# Patient Record
Sex: Female | Born: 1940 | Race: White | Hispanic: Yes | Marital: Single | Smoking: Never smoker
Health system: Southern US, Community
[De-identification: ages and names within clinical notes are randomized; demographics above are authoritative.]

## PROBLEM LIST (undated history)

## (undated) DIAGNOSIS — I10 Essential (primary) hypertension: Secondary | ICD-10-CM

## (undated) DIAGNOSIS — R109 Unspecified abdominal pain: Secondary | ICD-10-CM

## (undated) DIAGNOSIS — Z9889 Other specified postprocedural states: Secondary | ICD-10-CM

## (undated) DIAGNOSIS — T8859XA Other complications of anesthesia, initial encounter: Secondary | ICD-10-CM

## (undated) DIAGNOSIS — C50919 Malignant neoplasm of unspecified site of unspecified female breast: Secondary | ICD-10-CM

## (undated) DIAGNOSIS — F419 Anxiety disorder, unspecified: Secondary | ICD-10-CM

## (undated) DIAGNOSIS — K219 Gastro-esophageal reflux disease without esophagitis: Secondary | ICD-10-CM

## (undated) DIAGNOSIS — F329 Major depressive disorder, single episode, unspecified: Secondary | ICD-10-CM

## (undated) DIAGNOSIS — R0600 Dyspnea, unspecified: Secondary | ICD-10-CM

## (undated) DIAGNOSIS — R1031 Right lower quadrant pain: Secondary | ICD-10-CM

## (undated) DIAGNOSIS — Z8489 Family history of other specified conditions: Secondary | ICD-10-CM

## (undated) DIAGNOSIS — J45909 Unspecified asthma, uncomplicated: Secondary | ICD-10-CM

## (undated) DIAGNOSIS — M545 Low back pain: Secondary | ICD-10-CM

## (undated) DIAGNOSIS — E039 Hypothyroidism, unspecified: Secondary | ICD-10-CM

## (undated) DIAGNOSIS — M542 Cervicalgia: Secondary | ICD-10-CM

## (undated) DIAGNOSIS — R300 Dysuria: Secondary | ICD-10-CM

## (undated) DIAGNOSIS — D649 Anemia, unspecified: Secondary | ICD-10-CM

## (undated) DIAGNOSIS — K529 Noninfective gastroenteritis and colitis, unspecified: Secondary | ICD-10-CM

## (undated) DIAGNOSIS — I209 Angina pectoris, unspecified: Secondary | ICD-10-CM

## (undated) DIAGNOSIS — M810 Age-related osteoporosis without current pathological fracture: Secondary | ICD-10-CM

## (undated) DIAGNOSIS — Z8619 Personal history of other infectious and parasitic diseases: Secondary | ICD-10-CM

## (undated) DIAGNOSIS — H547 Unspecified visual loss: Secondary | ICD-10-CM

## (undated) DIAGNOSIS — N390 Urinary tract infection, site not specified: Secondary | ICD-10-CM

## (undated) DIAGNOSIS — M79605 Pain in left leg: Secondary | ICD-10-CM

## (undated) DIAGNOSIS — K589 Irritable bowel syndrome without diarrhea: Secondary | ICD-10-CM

## (undated) DIAGNOSIS — K9041 Non-celiac gluten sensitivity: Secondary | ICD-10-CM

## (undated) DIAGNOSIS — M1990 Unspecified osteoarthritis, unspecified site: Secondary | ICD-10-CM

## (undated) DIAGNOSIS — G4730 Sleep apnea, unspecified: Secondary | ICD-10-CM

## (undated) DIAGNOSIS — J45991 Cough variant asthma: Secondary | ICD-10-CM

## (undated) DIAGNOSIS — Z923 Personal history of irradiation: Secondary | ICD-10-CM

## (undated) DIAGNOSIS — D229 Melanocytic nevi, unspecified: Secondary | ICD-10-CM

## (undated) DIAGNOSIS — T4145XA Adverse effect of unspecified anesthetic, initial encounter: Secondary | ICD-10-CM

## (undated) DIAGNOSIS — Z5189 Encounter for other specified aftercare: Secondary | ICD-10-CM

## (undated) DIAGNOSIS — E785 Hyperlipidemia, unspecified: Secondary | ICD-10-CM

## (undated) DIAGNOSIS — R112 Nausea with vomiting, unspecified: Secondary | ICD-10-CM

## (undated) DIAGNOSIS — J189 Pneumonia, unspecified organism: Secondary | ICD-10-CM

## (undated) DIAGNOSIS — I509 Heart failure, unspecified: Secondary | ICD-10-CM

## (undated) HISTORY — DX: Personal history of other infectious and parasitic diseases: Z86.19

## (undated) HISTORY — DX: Sleep apnea, unspecified: G47.30

## (undated) HISTORY — DX: Dyspnea, unspecified: R06.00

## (undated) HISTORY — DX: Hypothyroidism, unspecified: E03.9

## (undated) HISTORY — DX: Encounter for other specified aftercare: Z51.89

## (undated) HISTORY — DX: Melanocytic nevi, unspecified: D22.9

## (undated) HISTORY — DX: Malignant neoplasm of unspecified site of unspecified female breast: C50.919

## (undated) HISTORY — PX: FRACTURE SURGERY: SHX138

## (undated) HISTORY — DX: Pain in left leg: M79.605

## (undated) HISTORY — DX: Unspecified abdominal pain: R10.9

## (undated) HISTORY — DX: Low back pain: M54.5

## (undated) HISTORY — DX: Irritable bowel syndrome, unspecified: K58.9

## (undated) HISTORY — DX: Unspecified asthma, uncomplicated: J45.909

## (undated) HISTORY — PX: EYE SURGERY: SHX253

## (undated) HISTORY — PX: TONSILLECTOMY: SUR1361

## (undated) HISTORY — DX: Noninfective gastroenteritis and colitis, unspecified: K52.9

## (undated) HISTORY — DX: Hyperlipidemia, unspecified: E78.5

## (undated) HISTORY — PX: ABDOMINAL HYSTERECTOMY: SHX81

## (undated) HISTORY — DX: Urinary tract infection, site not specified: N39.0

## (undated) HISTORY — DX: Essential (primary) hypertension: I10

## (undated) HISTORY — DX: Unspecified visual loss: H54.7

## (undated) HISTORY — PX: APPENDECTOMY: SHX54

## (undated) HISTORY — DX: Dysuria: R30.0

## (undated) HISTORY — DX: Non-celiac gluten sensitivity: K90.41

## (undated) HISTORY — DX: Cervicalgia: M54.2

## (undated) HISTORY — DX: Gastro-esophageal reflux disease without esophagitis: K21.9

## (undated) HISTORY — DX: Age-related osteoporosis without current pathological fracture: M81.0

## (undated) HISTORY — DX: Right lower quadrant pain: R10.31

## (undated) HISTORY — DX: Cough variant asthma: J45.991

## (undated) HISTORY — PX: UPPER GASTROINTESTINAL ENDOSCOPY: SHX188

## (undated) HISTORY — PX: CHOLECYSTECTOMY: SHX55

## (undated) HISTORY — DX: Heart failure, unspecified: I50.9

## (undated) NOTE — *Deleted (*Deleted)
Cardiology Admission History and Physical:   Patient ID: Julie Wilkerson MRN: [id number]; DOB: Jun 01, 1940   Admission date: 03/04/2020  Primary Care Provider: Bradd Canary, MD [facility] Cardiologist: Thomasene Ripple, DO  [facility] Electrophysiologist:  None   Chief Complaint:  Chest pain  Patient Profile:   Julie Wilkerson is a 31 y.o. female with PMH of nonobstructive CAD, hypertension, hyperlipidemia, chronic diastolic heart failure who presented to med [facility] with chest pain.  History of Present Illness:   Julie Wilkerson is a 29 year old female with past medical history noted above.  Catheterization on 01/2019 which noted mild to moderate nonobstructive CAD including 30% proximal to mid LAD, 50% ostial diagonal and 20% ostial/proximal left main and mid RCA lesions.  This was done in the setting of preoperative evaluation for hip surgery. She was continued on medical therapy.  She underwent hip surgery on 02/13/2019 which was complicated by acute on chronic diastolic heart failure as she was not placed on Lasix post discharge.  She was seen in the office on November 2020 and reported intermittent palpitations.  Had a ZIO monitor placed to rule out arrhythmias.  There showed 9 episodes of SVT which was felt to be atrial tachycardia with variable block.  She was continued on Toprol 25 mg daily.  She was last seen in the office on 02/26/2020 when she reported she was experiencing more palpitations.  Plan was to have her take an extra 2.5 mg of her metoprolol XL as needed.  She was to follow-up within 2 weeks to determine if this helps with symptoms.  Also complained of some left-sided arm pain which was felt to be neuropathic.  She was continued on aspirin 81 mg daily along with atorvastatin 20 mg daily.  Presented to med [facility] ED on 10/12 with complaints of headache and chest pain.  Awoke around 2 AM with severe headache associated with chest pain and shortness of  breath.  Also complained of pain in her left arm and hand.  Also indicated she had had episodes of chest pain over the past few days.***  In the ED her labs showed stable electrolytes, creatinine 1.07, high-sensitivity troponin 36>> 37, hemoglobin 10.6, D-dimer 1.66.  Chest x-ray with no acute abnormality.  CTA was negative for dissection or PE.  Case was discussed with Dr. Duke Salvia and patient was transferred to [facility] for further management.  Past Medical History:  Diagnosis Date  . Anemia   . Anginal pain (HCC)   . Anxiety   . Arthritis   . Asthma   . Blood transfusion without reported diagnosis   . Breast cancer (HCC)    2002, left, encapsulated, microcalcifications. lumpectomy, radtiation x 30  . Breast cancer in female [facility])   . Change in mole 06/08/2016  . CHF (congestive heart failure) (HCC)   . Colitis   . Complication of anesthesia    VERY SENSITIVE TO ANESTHESIA   . Decreased visual acuity 01/16/2017  . Dyspnea 11/04/2016   with asthma exacerbation only  . Dysuria 11/04/2016  . Gluten intolerance   . History of chicken pox   . History of Helicobacter pylori infection 08/01/2015  . Hyperlipidemia   . Hypertension   . Hypothyroidism   . IBS (irritable bowel syndrome)   . Left leg pain 03/01/2017  . Low back pain 08/10/2015  . 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 Neck pain 03/01/2017  . Osteoporosis   . Personal history of  radiation therapy   . Pneumonia   . PONV (postoperative nausea and vomiting)   . RLQ discomfort 03/01/2017  . Stomach cramps   . Urinary tract infection 11/04/2016    Past Surgical History:  Procedure Laterality Date  . ABDOMINAL HYSTERECTOMY     partial  . APPENDECTOMY    . BREAST LUMPECTOMY Left 2002  . CHOLECYSTECTOMY  Age 9 or 64  . COLONOSCOPY  2017  . EYE SURGERY Left    cataract  . LEFT HEART CATH AND CORONARY ANGIOGRAPHY N/A 02/09/2019   Procedure: LEFT HEART CATH AND CORONARY ANGIOGRAPHY;  Surgeon: Yvonne Kendall, MD;  Location: [facility] INVASIVE CV LAB;  Service: Cardiovascular;  Laterality: N/A;  . TONSILLECTOMY    . TOTAL HIP ARTHROPLASTY Left 02/13/2019   Procedure: TOTAL HIP ARTHROPLASTY ANTERIOR APPROACH;  Surgeon: Durene Romans, MD;  Location: [facility] ORS;  Service: Orthopedics;  Laterality: Left;  70 mins     Medications Prior to Admission: Prior to Admission medications   Medication Sig Start Date End Date Taking? Authorizing Provider  albuterol (VENTOLIN HFA) 108 (90 Base) MCG/ACT inhaler Inhale 2 puffs into the lungs every 6 (six) hours as needed for wheezing or shortness of breath. 02/26/19   Bradd Canary, MD  ASPIRIN 81 PO 81 mg 2 (two) times daily.    [provider]  atorvastatin (LIPITOR) 20 MG tablet Take 1 tablet by mouth once daily 02/14/20   Tobb, Kardie, DO  citalopram (CELEXA) 20 MG tablet TAKE 1 TABLET BY MOUTH AT BEDTIME 10/08/19   Bradd Canary, MD  dicyclomine (BENTYL) 10 MG capsule TAKE 1 CAPSULE BY MOUTH EVERY 8 HOURS AS NEEDED FOR SPASMS 11/21/19   Armbruster, Willaim Rayas, MD  fluticasone (FLONASE) 50 MCG/ACT nasal spray Place 2 sprays into both nostrils daily as needed for allergies or rhinitis. 09/18/19   Bradd Canary, MD  furosemide (LASIX) 40 MG tablet Take 1 tablet by mouth once daily 12/19/19   Bradd Canary, MD  hyoscyamine (LEVSIN SL) 0.125 MG SL tablet DISSOLVE 1 TABLET UNDER THE TONGUE EVERY 6 HOURS AS NEEDED 01/24/20   Bradd Canary, MD  levothyroxine (EUTHYROX) 50 MCG tablet Take 1 tablet (50 mcg total) by mouth daily. 02/28/20   Bradd Canary, MD  lisinopril (ZESTRIL) 5 MG tablet Take 1 tablet by mouth twice daily 10/31/19   Bradd Canary, MD  metoprolol succinate (TOPROL-XL) 25 MG 24 hr tablet TAKE 1 TABLET BY MOUTH AT BEDTIME 02/13/20   Bradd Canary, MD  omega-3 acid ethyl esters (LOVAZA) 1 g capsule Take 2 g by mouth daily.    [provider]  omeprazole (PRILOSEC) 40 MG capsule Take 1 capsule (40 mg total) by mouth in the morning and at  bedtime. Take 1 tablet by mouth once to twice daily 11/20/19   Armbruster, Willaim Rayas, MD  ondansetron (ZOFRAN) 4 MG tablet Take 1 tablet (4 mg total) by mouth every 8 (eight) hours as needed for nausea or vomiting. 02/26/19   Bradd Canary, MD  potassium chloride (KLOR-CON) 10 MEQ tablet Take 1 tablet by mouth once daily 10/23/19   Bradd Canary, MD  pramipexole (MIRAPEX) 0.125 MG tablet Take 1 tablet (0.125 mg total) by mouth 2 (two) times daily. 05/03/19   Bradd Canary, MD  primidone (MYSOLINE) 50 MG tablet TAKE 1 TABLET BY MOUTH AT BEDTIME 08/20/19   Tat, Octaviano Batty, DO  sucralfate (CARAFATE) 1 g tablet Take 1 tablet (1 g total)  by mouth 2 (two) times daily. 11/16/19   Saguier, Ramon Dredge, PA-C     Allergies:   No Known Allergies  Social History:   Social History   Socioeconomic History  . Marital status: Single    Spouse name: Not on file  . Number of children: 3  . Years of education: 75  . Highest education level: Master's degree (e.g., MA, MS, MEng, MEd, MSW, MBA)  Occupational History  . Occupation: retired    Comment: Runner, broadcasting/film/video, kindergarten  Tobacco Use  . Smoking status: Never Smoker  . Smokeless tobacco: Never Used  Vaping Use  . Vaping Use: Never used  Substance and Sexual Activity  . Alcohol use: Never    Alcohol/week: 0.0 standard drinks  . Drug use: Never  . Sexual activity: Not Currently    Birth control/protection: None    Comment: lives alone, avoids daiiry and gluten. 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 Not on file  Social History Narrative  . Not on file   Social Determinants of Health   Financial Resource Strain:   . Difficulty of Paying Living Expenses: Not on file  Food Insecurity:   . Worried About Programme researcher, broadcasting/film/video in the Last Year: Not on file  . Ran Out of Food in the Last Year: Not on file  Transportation Needs:   . Lack of Transportation (Medical): Not on file  . Lack of Transportation (Non-Medical): Not on file  Physical  Activity:   . Days of Exercise per Week: Not on file  . Minutes of Exercise per Session: Not on file  Stress:   . Feeling of Stress : Not on file  Social Connections:   . Frequency of Communication with Friends and Family: Not on file  . Frequency of Social Gatherings with Friends and Family: Not on file  . Attends Religious Services: Not on file  . Active Member of Clubs or Organizations: Not on file  . Attends Banker Meetings: Not on file  . Marital Status: Not on file  Intimate Partner Violence:   . Fear of Current or Ex-Partner: Not on file  . Emotionally Abused: Not on file  . Physically Abused: Not on file  . Sexually Abused: Not on file    Family History:   The patient's family history includes Arthritis in her daughter; Colitis in her mother; Colon cancer in her paternal grandmother; Heart attack in her brother, brother, and brother; Heart disease in her daughter; Hyperlipidemia in her brother, brother, brother, and sister; Hypertension in her father; Irritable bowel syndrome in her mother; Leukemia in her daughter; Stomach cancer (age of onset: 60) in her paternal grandmother. There is no history of Esophageal cancer.    ROS:  Please see the history of present illness.  All other ROS reviewed and negative.     Physical Exam/Data:   Vitals:   03/04/20 1012 03/04/20 1013 03/04/20 1230  BP: (!) 145/85  139/64  Pulse: 60  70  Resp: 18  20  Temp: 98.7 F (37.1 C)    TempSrc: Oral    SpO2: 98%  97%  Weight:  54.4 kg   Height:  4\' 10"  (1.473 m)    No intake or output data in the 24 hours ending 03/04/20 1444 Last 3 Weights 03/04/2020 02/26/2020 01/31/2020  Weight (lbs) 120 lb 122 lb 12.8 oz (No Data)  Weight (kg) 54.432 kg 55.702 kg (No Data)     Body mass index is 25.08 kg/m.  General:  Well nourished, well developed, in no acute distress*** HEENT: normal Lymph: no adenopathy Neck: no*** JVD Endocrine:  No thryomegaly Vascular: No carotid bruits; FA  pulses 2+ bilaterally without bruits  Cardiac:  normal S1, S2; RRR; no murmur *** Lungs:  clear to auscultation bilaterally, no wheezing, rhonchi or rales  Abd: soft, nontender, no hepatomegaly  Ext: no*** edema Musculoskeletal:  No deformities, BUE and BLE strength normal and equal Skin: warm and dry  Neuro:  CNs 2-12 intact, no focal abnormalities noted Psych:  Normal affect    EKG:  The ECG that was done *** was personally reviewed and demonstrates ***  Relevant CV Studies: Cardiac monitor 04/24/2019: The patient wore the monitor for 5 day starting 04/03/2019. Indication: Palpitations The minimum heart rate was 45 bpm, the maximum heart rate was 144 bpm, and average heart rate was 59 bpm. Predominant underlying rhythm was Sinus Rhythm.  9 Supraventricular Tachycardia runs occurred, the run with the fastest interval lasting 5 beats with a max rate of 144 bpm, the longest lasting 13 beats with an avg rate of 109 bpm. Premature atrial complexes were rare (<1.0%). Premature ventricular complexes were rare (<1.0%). No patient triggered events were noted.  No Ventricular tachycardia, No pauses, No AV block and no atrial fibrillation present.  Conclusion: This study is remarkable for supraventricular tachycardia which is likely atrial tachycardia with variable block.   Limited echo 02/16/19: 1. Left ventricular ejection fraction, by visual estimation, is 60 to  65%. The left ventricle has normal function. There is no left ventricular  hypertrophy.  2. Global right ventricle has normal systolic function.The right  ventricular size is normal. No increase in right ventricular wall  thickness.  3. .  4. Limited echo.   Left heart catheterization 01/2019: Conclusions:  Mild to moderate, non-obstructive coronary artery disease, including sequential 30% proximal and mid LAD stenoses, 50% ostial D1 lesion, and 20% ostial/proximal LMCA and mid RCA lesions.  No definite evidence of  myocardial bridge on today's study.  Normal left ventricular filling pressure.  LVEF noted to be normal on recent echocardiogram.  Recommendations:  Continue medical therapy and risk factor modification.  Consider addition of statin to prevent progression of disease.  Hold aspirin in the setting of upcoming orthopedic surgery next week.  Long-term, I recommend aspirin 81 mg daily in the setting of mild to moderate coronary artery disease.  I think it is reasonable to proceed with hip arthroplasty next week without additional cardiac testing/intervention.  Echocardiogram 02/06/2019: 1. The left ventricle has normal systolic function with an ejection  fraction of 60-65%. The cavity size was normal. Left ventricular diastolic  Doppler parameters are consistent with pseudonormalization. Elevated mean  left atrial pressure No evidence of  left ventricular regional wall motion abnormalities.  2. The right ventricle has normal systolic function. The cavity was  normal. There is no increase in right ventricular wall thickness. Right  ventricular systolic pressure is normal.  3. Left atrial size was mildly dilated.  4. No evidence of mitral valve stenosis.  5. The aortic valve is tricuspid. Mild thickening of the aortic valve.  Mild calcification of the aortic valve. Aortic valve regurgitation is mild  by color flow Doppler. No stenosis of the aortic valve.  6. The aorta is normal unless otherwise noted.  7. The average left ventricular global longitudinal strain is -19.6 %.   Laboratory Data:  High Sensitivity Troponin:   Recent Labs  Lab 03/04/20 1052 03/04/20 1238  TROPONINIHS  36* 37*      Chemistry Recent Labs  Lab 03/04/20 1052  NA 139  K 3.6  CL 103  CO2 25  GLUCOSE 99  BUN 21  CREATININE 1.07*  CALCIUM 8.7*  GFRNONAA 50*  ANIONGAP 11    Recent Labs  Lab 03/04/20 1052  PROT 7.1  ALBUMIN 4.0  AST 32  ALT 21  ALKPHOS 65  BILITOT 0.6   Hematology Recent  Labs  Lab 03/04/20 1052  WBC 3.9*  RBC 3.65*  HGB 10.6*  HCT 32.5*  MCV 89.0  MCH 29.0  MCHC 32.6  RDW 14.3  PLT 187   BNPNo results for input(s): BNP, PROBNP in the last 168 hours.  DDimer  Recent Labs  Lab 03/04/20 1052  DDIMER 1.66*     Radiology/Studies:  DG Chest 2 View  Result Date: 03/04/2020 CLINICAL DATA:  Chest pain EXAM: CHEST - 2 VIEW COMPARISON:  February 22, 2019 FINDINGS: There are areas of scattered scarring in each mid and lower lung region. Lungs elsewhere are clear. No edema or airspace opacity. Heart size and pulmonary vascularity are normal. There is aortic atherosclerosis. There is thoracolumbar levoscoliosis. IMPRESSION: Scattered areas of scarring bilaterally. No edema or airspace opacity. Heart size normal. Aortic Atherosclerosis (ICD10-I70.0). Electronically Signed   By: Bretta Bang III M.D.   On: 03/04/2020 11:36   CT Head Wo Contrast  Result Date: 03/04/2020 CLINICAL DATA:  Headache and nausea EXAM: CT HEAD WITHOUT CONTRAST TECHNIQUE: Contiguous axial images were obtained from the base of the skull through the vertex without intravenous contrast. COMPARISON:  February 22, 2019 FINDINGS: Brain: There is stable age related volume loss. There is no intracranial mass, hemorrhage, extra-axial fluid collection, or midline shift. There is patchy small vessel disease in the centra semiovale bilaterally. No acute infarct is evident. Vascular: No hyperdense vessel. There is calcification in each carotid siphon region. Skull: The bony calvarium appears intact. Sinuses/Orbits: There is patchy opacity in the sphenoid sinuses bilaterally. Visualized orbits appear symmetric bilaterally except for evidence of previous cataract removal on the left. Other: There is opacification in several inferior mastoid air cells on the left. Mastoids elsewhere are clear. IMPRESSION: Age related volume loss with stable periventricular small vessel disease. No acute infarct. No mass or  hemorrhage. There are foci of arterial vascular calcification. There is patchy opacity in the sphenoid sinuses bilaterally. There is stable inferior mastoid air cell disease on the left. Electronically Signed   By: Bretta Bang III M.D.   On: 03/04/2020 11:29   CT Angio Chest/Abd/Pel for Dissection W and/or W/WO  Result Date: 03/04/2020 CLINICAL DATA:  Chest pain radiating to back, aortic dissection suspected EXAM: CT ANGIOGRAPHY CHEST, ABDOMEN AND PELVIS TECHNIQUE: Non-contrast CT of the chest was initially obtained. Multidetector CT imaging through the chest, abdomen and pelvis was performed using the standard protocol during bolus administration of intravenous contrast. Multiplanar reconstructed images and MIPs were obtained and reviewed to evaluate the vascular anatomy. CONTRAST:  OMNIPAQUE IOHEXOL 350 MG/ML SOLN COMPARISON:  CT abdomen pelvis, 06/06/2019 FINDINGS: CTA CHEST FINDINGS Cardiovascular: Preferential opacification of the thoracic aorta. Normal contour and caliber of the thoracic aorta. No evidence of aneurysm, dissection, or other acute aortic pathology. Scattered atherosclerosis. Normal heart size. No pericardial effusion. Mediastinum/Nodes: No enlarged mediastinal, hilar, or axillary lymph nodes. Small hiatal hernia. Thyroid gland, trachea, and esophagus demonstrate no significant findings. Lungs/Pleura: Bandlike scarring of the lung bases. No pleural effusion or pneumothorax. Musculoskeletal: No chest wall abnormality. No  acute or significant osseous findings. Review of the MIP images confirms the above findings. CTA ABDOMEN AND PELVIS FINDINGS VASCULAR Normal contour and caliber of the abdominal aorta. No evidence of aneurysm, dissection, or other acute aortic pathology. Standard branching pattern of the abdominal aorta, with solitary bilateral renal arteries. Scattered atherosclerosis. Review of the MIP images confirms the above findings. NON-VASCULAR Hepatobiliary: No focal  liver abnormality is seen. Status post cholecystectomy. Postoperative biliary dilatation. Pancreas: Unremarkable. No pancreatic ductal dilatation or surrounding inflammatory changes. Spleen: Normal in size without significant abnormality. Adrenals/Urinary Tract: Adrenal glands are unremarkable. Kidneys are normal, without renal calculi, solid lesion, or hydronephrosis. Bladder is unremarkable. Stomach/Bowel: Stomach is within normal limits. Appendix appears normal. No evidence of bowel wall thickening, distention, or inflammatory changes. Sigmoid diverticulosis. Lymphatic: No enlarged abdominal or pelvic lymph nodes. Reproductive: Status post hysterectomy. Other: No abdominal wall hernia or abnormality. No abdominopelvic ascites. Musculoskeletal: No acute or significant osseous findings. Status post left hip total arthroplasty. Review of the MIP images confirms the above findings. IMPRESSION: 1. Normal contour and caliber of the thoracic and abdominal aorta. No evidence of aneurysm, dissection, or other acute aortic pathology. Scattered atherosclerosis. 2. No acute findings in the chest, abdomen or pelvis to explain pain. 3. Small hiatal hernia. 4. Sigmoid diverticulosis without evidence of diverticulitis. 5. Status post cholecystectomy and hysterectomy. Electronically Signed   By: Lauralyn Primes M.D.   On: 03/04/2020 12:52     Assessment and Plan:   1. Chest pain in patient with known mild to moderate CAD: patient presented to [facility] with complaints of chest pain that awoke her from sleep at 2am this morning with associated HA and SOB. EKG with sinus rhythm and non-specific ST-T wave abnormalities. HsTrop 36>37; low flat trend not c/w ACS. CTA Chest was without PE or dissection.   {Complete the following score calculators/questions which help meet required metrics.  Press F2         :[id number]}   {Is the patient being seen for CHEST PAIN, UNSTABLE ANGINA, NSTEMI or STEMI?     :[id number]}  {Does this patient  have CHF or CHF symptoms?      :[id number]} {Does this patient have ATRIAL FIBRILLATION?:[id number]} Severity of Illness: {Observation/Inpatient:21159}   For questions or updates, please contact [facility] Please consult [email or web address] for contact info under     Signed, Laverda Page, NP  03/04/2020 2:44 PM

---
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
## 2000-05-24 HISTORY — PX: BREAST LUMPECTOMY: SHX2

## 2011-04-06 LAB — HM COLONOSCOPY

## 2014-11-07 LAB — HM DEXA SCAN

## 2014-11-11 ENCOUNTER — Ambulatory Visit: Payer: Self-pay | Admitting: Family Medicine

## 2015-01-20 ENCOUNTER — Ambulatory Visit: Payer: Self-pay | Admitting: Family Medicine

## 2015-02-25 DIAGNOSIS — H53142 Visual discomfort, left eye: Secondary | ICD-10-CM | POA: Diagnosis not present

## 2015-02-25 DIAGNOSIS — R51 Headache: Secondary | ICD-10-CM | POA: Diagnosis not present

## 2015-02-25 DIAGNOSIS — R42 Dizziness and giddiness: Secondary | ICD-10-CM | POA: Diagnosis not present

## 2015-02-25 DIAGNOSIS — R0789 Other chest pain: Secondary | ICD-10-CM | POA: Diagnosis not present

## 2015-03-11 DIAGNOSIS — Z23 Encounter for immunization: Secondary | ICD-10-CM | POA: Diagnosis not present

## 2015-05-25 HISTORY — PX: COLONOSCOPY: SHX174

## 2015-06-11 DIAGNOSIS — R1013 Epigastric pain: Secondary | ICD-10-CM | POA: Diagnosis not present

## 2015-06-11 DIAGNOSIS — I10 Essential (primary) hypertension: Secondary | ICD-10-CM | POA: Diagnosis not present

## 2015-06-11 DIAGNOSIS — E039 Hypothyroidism, unspecified: Secondary | ICD-10-CM | POA: Diagnosis not present

## 2015-06-11 DIAGNOSIS — R69 Illness, unspecified: Secondary | ICD-10-CM | POA: Diagnosis not present

## 2015-06-11 DIAGNOSIS — M810 Age-related osteoporosis without current pathological fracture: Secondary | ICD-10-CM | POA: Diagnosis not present

## 2015-06-11 DIAGNOSIS — R251 Tremor, unspecified: Secondary | ICD-10-CM | POA: Diagnosis not present

## 2015-06-26 DIAGNOSIS — D649 Anemia, unspecified: Secondary | ICD-10-CM | POA: Diagnosis not present

## 2015-06-26 DIAGNOSIS — R197 Diarrhea, unspecified: Secondary | ICD-10-CM | POA: Diagnosis not present

## 2015-06-26 DIAGNOSIS — R1314 Dysphagia, pharyngoesophageal phase: Secondary | ICD-10-CM | POA: Diagnosis not present

## 2015-06-26 DIAGNOSIS — K219 Gastro-esophageal reflux disease without esophagitis: Secondary | ICD-10-CM | POA: Diagnosis not present

## 2015-06-26 DIAGNOSIS — R1084 Generalized abdominal pain: Secondary | ICD-10-CM | POA: Diagnosis not present

## 2015-06-30 DIAGNOSIS — R1084 Generalized abdominal pain: Secondary | ICD-10-CM | POA: Diagnosis not present

## 2015-06-30 DIAGNOSIS — R197 Diarrhea, unspecified: Secondary | ICD-10-CM | POA: Diagnosis not present

## 2015-07-11 DIAGNOSIS — R1084 Generalized abdominal pain: Secondary | ICD-10-CM | POA: Diagnosis not present

## 2015-07-11 DIAGNOSIS — D649 Anemia, unspecified: Secondary | ICD-10-CM | POA: Diagnosis not present

## 2015-07-14 DIAGNOSIS — R251 Tremor, unspecified: Secondary | ICD-10-CM | POA: Diagnosis not present

## 2015-07-14 DIAGNOSIS — R200 Anesthesia of skin: Secondary | ICD-10-CM | POA: Diagnosis not present

## 2015-07-14 DIAGNOSIS — I10 Essential (primary) hypertension: Secondary | ICD-10-CM | POA: Diagnosis not present

## 2015-07-14 DIAGNOSIS — R1013 Epigastric pain: Secondary | ICD-10-CM | POA: Diagnosis not present

## 2015-07-24 DIAGNOSIS — R200 Anesthesia of skin: Secondary | ICD-10-CM | POA: Diagnosis not present

## 2015-07-24 DIAGNOSIS — Z8673 Personal history of transient ischemic attack (TIA), and cerebral infarction without residual deficits: Secondary | ICD-10-CM | POA: Diagnosis not present

## 2015-07-24 DIAGNOSIS — R251 Tremor, unspecified: Secondary | ICD-10-CM | POA: Diagnosis not present

## 2015-07-24 DIAGNOSIS — R4182 Altered mental status, unspecified: Secondary | ICD-10-CM | POA: Diagnosis not present

## 2015-07-31 ENCOUNTER — Encounter: Payer: Self-pay | Admitting: *Deleted

## 2015-07-31 ENCOUNTER — Telehealth: Payer: Self-pay | Admitting: *Deleted

## 2015-07-31 NOTE — Telephone Encounter (Signed)
Pre-Visit Call completed with patient and chart updated.   Pre-Visit Info documented in Specialty Comments under [employer].    

## 2015-07-31 NOTE — Telephone Encounter (Signed)
Pt not available at time of call. She is waiting to get her MMG and will try to call back this afternoon.

## 2015-08-01 ENCOUNTER — Encounter: Payer: Self-pay | Admitting: Family Medicine

## 2015-08-01 ENCOUNTER — Ambulatory Visit (INDEPENDENT_AMBULATORY_CARE_PROVIDER_SITE_OTHER): Payer: Medicare HMO | Admitting: Family Medicine

## 2015-08-01 VITALS — BP 122/78 | HR 64 | Temp 97.5°F | Ht 59.0 in | Wt 124.1 lb

## 2015-08-01 DIAGNOSIS — Z8619 Personal history of other infectious and parasitic diseases: Secondary | ICD-10-CM | POA: Diagnosis not present

## 2015-08-01 DIAGNOSIS — C50919 Malignant neoplasm of unspecified site of unspecified female breast: Secondary | ICD-10-CM | POA: Insufficient documentation

## 2015-08-01 DIAGNOSIS — N63 Unspecified lump in unspecified breast: Secondary | ICD-10-CM

## 2015-08-01 DIAGNOSIS — K589 Irritable bowel syndrome without diarrhea: Secondary | ICD-10-CM | POA: Insufficient documentation

## 2015-08-01 DIAGNOSIS — E039 Hypothyroidism, unspecified: Secondary | ICD-10-CM | POA: Diagnosis not present

## 2015-08-01 DIAGNOSIS — M810 Age-related osteoporosis without current pathological fracture: Secondary | ICD-10-CM

## 2015-08-01 DIAGNOSIS — J45991 Cough variant asthma: Secondary | ICD-10-CM | POA: Insufficient documentation

## 2015-08-01 DIAGNOSIS — K9041 Non-celiac gluten sensitivity: Secondary | ICD-10-CM | POA: Insufficient documentation

## 2015-08-01 DIAGNOSIS — M545 Low back pain: Secondary | ICD-10-CM

## 2015-08-01 DIAGNOSIS — E785 Hyperlipidemia, unspecified: Secondary | ICD-10-CM

## 2015-08-01 DIAGNOSIS — I10 Essential (primary) hypertension: Secondary | ICD-10-CM

## 2015-08-01 HISTORY — DX: Personal history of other infectious and parasitic diseases: Z86.19

## 2015-08-01 MED ORDER — HYOSCYAMINE SULFATE 0.125 MG SL SUBL: 0.1250 mg | SUBLINGUAL_TABLET | SUBLINGUAL | Status: DC | PRN

## 2015-08-01 NOTE — Assessment & Plan Note (Signed)
Well controlled, no changes to meds. Encouraged heart healthy diet such as the DASH diet and exercise as tolerated.  °

## 2015-08-01 NOTE — Progress Notes (Signed)
Subjective:    Patient ID: Julie Wilkerson, female    DOB: [birth year], 75 y.o.   MRN: [id number]  Chief Complaint  Patient presents with  . Establish Care    HPI Patient is in today to establish care as new patient. Patient has some concerns with LLQ adominal pain as well as low back pain, that radiates down leg left leg.   She denies any fefvers, chills or anorexia. Does endorse some nausea but no vomiting. her Denies CP/palp/SOB/HA/congestion/fevers or GU c/o. Taking meds as prescribed Past Medical History  Diagnosis Date  . Stomach cramps   . IBS (irritable bowel syndrome)   . Gluten intolerance   . Asthma   . Breast cancer ([place])   . Hyperlipidemia   . Hypertension   . Osteoporosis   . Hypothyroid   . History of Helicobacter pylori infection 08/01/2015    Past Surgical History  Procedure Laterality Date  . Appendectomy    . Abdominal hysterectomy    . Cholecystectomy  Age 64 or 39  . Breast lumpectomy Left 2002  . Tonsillectomy    . Fracture surgery      Family History  Problem Relation Age of Onset  . Colitis Mother   . Irritable bowel syndrome Mother   . Hypertension Father   . Hyperlipidemia Brother   . Heart attack Brother   . Stomach cancer Paternal Grandmother   . Hyperlipidemia Brother   . Heart attack Brother   . Hyperlipidemia Brother   . Heart attack Brother     Social History   Social History  . Marital Status: Divorced    Spouse Name: N/A  . Number of Children: N/A  . Years of Education: N/A   Occupational History  . retired    Social History Main Topics  . Smoking status: Never Smoker   . Smokeless tobacco: Not on file  . Alcohol Use: No  . Drug Use: No  . Sexual Activity: Not on file   Other Topics Concern  . Not on file   Social History Narrative    Outpatient Prescriptions Prior to Visit  Medication Sig Dispense Refill  . alendronate (FOSAMAX) 70 MG tablet Take 70 mg by mouth once a week.    . budesonide-formoterol  (SYMBICORT) 80-4.5 MCG/ACT inhaler Inhale 2 puffs into the lungs 2 (two) times daily.    . citalopram (CELEXA) 10 MG tablet Take 10 mg by mouth daily.    Marland Kitchen levothyroxine (SYNTHROID, LEVOTHROID) 50 MCG tablet Take 50 mcg by mouth daily.    Marland Kitchen lisinopril (PRINIVIL,ZESTRIL) 20 MG tablet Take 20 mg by mouth daily.    . metoprolol succinate (TOPROL-XL) 50 MG 24 hr tablet Take 50 mg by mouth daily.    . Multiple Vitamins-Minerals (MULTIVITAMIN ADULT PO) Take 1 tablet by mouth daily.    Marland Kitchen gabapentin (NEURONTIN) 100 MG capsule Take 100-300 mg by mouth at bedtime as needed.     No facility-administered medications prior to visit.    No Known Allergies  Review of Systems  Constitutional: Negative for fever, chills and malaise/fatigue.  HENT: Negative for congestion and hearing loss.   Eyes: Negative for discharge.  Respiratory: Negative for cough, sputum production and shortness of breath.   Cardiovascular: Positive for leg swelling. Negative for chest pain and palpitations.  Gastrointestinal: Positive for heartburn, nausea, abdominal pain and diarrhea. Negative for vomiting, constipation and blood in stool.  Genitourinary: Negative for dysuria, urgency, frequency and hematuria.  Musculoskeletal: Positive for back pain. Negative  for myalgias and falls.  Skin: Negative for rash.  Neurological: Negative for dizziness, sensory change, loss of consciousness, weakness and headaches.  Endo/Heme/Allergies: Negative for environmental allergies. Does not bruise/bleed easily.  Psychiatric/Behavioral: Negative for depression and suicidal ideas. The patient is not nervous/anxious and does not have insomnia.        Objective:    Physical Exam  BP 122/78 mmHg  Pulse 64  Temp(Src) 97.5 F (36.4 C) (Oral)  Ht '4\' 11"'$  (1.499 m)  Wt 124 lb 2 oz (56.303 kg)  BMI 25.06 kg/m2  SpO2 93% Wt Readings from Last 3 Encounters:  08/01/15 124 lb 2 oz (56.303 kg)     No results found for: WBC, HGB, HCT, PLT,  GLUCOSE, CHOL, TRIG, HDL, LDLDIRECT, LDLCALC, ALT, AST, NA, K, CL, CREATININE, BUN, CO2, TSH, PSA, INR, GLUF, HGBA1C, MICROALBUR  No results found for: TSH No results found for: WBC, HGB, HCT, MCV, PLT No results found for: NA, K, CHLORIDE, CO2, GLUCOSE, BUN, CREATININE, BILITOT, ALKPHOS, AST, ALT, PROT, ALBUMIN, CALCIUM, ANIONGAP, EGFR, GFR No results found for: CHOL No results found for: HDL No results found for: LDLCALC No results found for: TRIG No results found for: CHOLHDL No results found for: HGBA1C     Assessment & Plan:   Problem List Items Addressed This Visit      Digestive   History of Helicobacter pylori infection - Primary      I have discontinued Ms. Garrott's gabapentin. I am also having her maintain her citalopram, levothyroxine, lisinopril, alendronate, metoprolol succinate, budesonide-formoterol, Multiple Vitamins-Minerals (MULTIVITAMIN ADULT PO), Collagen-Vitamin C (COLLAGEN PLUS VITAMIN C PO), and aspirin.  Meds ordered this encounter  Medications  . Collagen-Vitamin C (COLLAGEN PLUS VITAMIN C PO)    Sig: Take 1 tablet by mouth daily.  Marland Kitchen aspirin 81 MG tablet    Sig: Take 81 mg by mouth daily.     Jan Fireman, [place]

## 2015-08-01 NOTE — Patient Instructions (Addendum)
NOW [employer] 10 strain probiotic daily. Salonpas with lidocaine and aspercreme  Fiber supplement benefiber  Irritable Bowel Syndrome, Adult Irritable bowel syndrome (IBS) is not one specific disease. It is a group of symptoms that affects the organs responsible for digestion (gastrointestinal or GI tract).  To regulate how your GI tract works, your body sends signals back and forth between your intestines and your brain. If you have IBS, there may be a problem with these signals. As a result, your GI tract does not function normally. Your intestines may become more sensitive and overreact to certain things. This is especially true when you eat certain foods or when you are under stress.  There are four types of IBS. These may be determined based on the consistency of your stool:   IBS with diarrhea.   IBS with constipation.   Mixed IBS.   Unsubtyped IBS.  It is important to know which type of IBS you have. Some treatments are more likely to be helpful for certain types of IBS.  CAUSES  The exact cause of IBS is not known. RISK FACTORS You may have a higher risk of IBS if:  You are a woman.  You are younger than 74 years old.  You have a family history of IBS.  You have mental health problems.  You have had bacterial infection of your GI tract. SIGNS AND SYMPTOMS  Symptoms of IBS vary from person to person. The main symptom is abdominal pain or discomfort. Additional symptoms usually include one or more of the following:   Diarrhea, constipation, or both.   Abdominal swelling or bloating.   Feeling full or sick after eating a small or regular-size meal.   Frequent gas.   Mucus in the stool.   A feeling of having more stool left after a bowel movement.  Symptoms tend to come and go. They may be associated with stress, psychiatric conditions, or nothing at all.  DIAGNOSIS  There is no specific test to diagnose IBS. Your health care provider will make a diagnosis  based on a physical exam, medical history, and your symptoms. You may have other tests to rule out other conditions that may be causing your symptoms. These may include:   Blood tests.   X-rays.   CT scan.  Endoscopy and colonoscopy. This is a test in which your GI tract is viewed with a long, thin, flexible tube. TREATMENT There is no cure for IBS, but treatment can help relieve symptoms. IBS treatment often includes:   Changes to your diet, such as:  Eating more fiber.  Avoiding foods that cause symptoms.  Drinking more water.  Eating regular, medium-sized portioned meals.  Medicines. These may include:  Fiber supplements if you have constipation.  Medicine to control diarrhea (antidiarrheal medicines).  Medicine to help control muscle spasms in your GI tract (antispasmodic medicines).  Medicines to help with any mental health issues, such as antidepressants or tranquilizers.  Therapy.  Talk therapy may help with anxiety, depression, or other mental health issues that can make IBS symptoms worse.  Stress reduction.  Managing your stress can help keep symptoms under control. HOME CARE INSTRUCTIONS   Take medicines only as directed by your health care provider.  Eat a healthy diet.  Avoid foods and drinks with added sugar.  Include more whole grains, fruits, and vegetables gradually into your diet. This may be especially helpful if you have IBS with constipation.  Avoid any foods and drinks that make your symptoms worse.  These may include dairy products and caffeinated or carbonated drinks.  Do not eat large meals.  Drink enough fluid to keep your urine clear or pale yellow.  Exercise regularly. Ask your health care provider for recommendations of good activities for you.  Keep all follow-up visits as directed by your health care provider. This is important. SEEK MEDICAL CARE IF:   You have constant pain.  You have trouble or pain with  swallowing.  You have worsening diarrhea. SEEK IMMEDIATE MEDICAL CARE IF:   You have severe and worsening abdominal pain.   You have diarrhea and:   You have a rash, stiff neck, or severe headache.   You are irritable, sleepy, or difficult to awaken.   You are weak, dizzy, or extremely thirsty.   You have bright red blood in your stool or you have black tarry stools.   You have unusual abdominal swelling that is painful.   You vomit continuously.   You vomit blood (hematemesis).   You have both abdominal pain and a fever.    This information is not intended to replace advice given to you by your health care provider. Make sure you discuss any questions you have with your health care provider.   Document Released: 05/10/2005 Document Revised: 05/31/2014 Document Reviewed: 01/25/2014 Elsevier Interactive Patient Education 2016 [place] twice daily

## 2015-08-01 NOTE — Assessment & Plan Note (Signed)
On Levothyroxine, continue to monitor 

## 2015-08-01 NOTE — Progress Notes (Signed)
Pre visit review using our clinic review tool, if applicable. No additional management support is needed unless otherwise documented below in the visit note. 

## 2015-08-05 DIAGNOSIS — K2970 Gastritis, unspecified, without bleeding: Secondary | ICD-10-CM | POA: Diagnosis not present

## 2015-08-05 DIAGNOSIS — K208 Other esophagitis: Secondary | ICD-10-CM | POA: Diagnosis not present

## 2015-08-05 DIAGNOSIS — K6389 Other specified diseases of intestine: Secondary | ICD-10-CM | POA: Diagnosis not present

## 2015-08-05 DIAGNOSIS — D649 Anemia, unspecified: Secondary | ICD-10-CM | POA: Diagnosis not present

## 2015-08-05 DIAGNOSIS — D130 Benign neoplasm of esophagus: Secondary | ICD-10-CM | POA: Diagnosis not present

## 2015-08-05 DIAGNOSIS — D132 Benign neoplasm of duodenum: Secondary | ICD-10-CM | POA: Diagnosis not present

## 2015-08-05 DIAGNOSIS — K648 Other hemorrhoids: Secondary | ICD-10-CM | POA: Diagnosis not present

## 2015-08-05 DIAGNOSIS — K2950 Unspecified chronic gastritis without bleeding: Secondary | ICD-10-CM | POA: Diagnosis not present

## 2015-08-05 DIAGNOSIS — J45909 Unspecified asthma, uncomplicated: Secondary | ICD-10-CM | POA: Diagnosis not present

## 2015-08-05 DIAGNOSIS — R197 Diarrhea, unspecified: Secondary | ICD-10-CM | POA: Diagnosis not present

## 2015-08-05 DIAGNOSIS — R109 Unspecified abdominal pain: Secondary | ICD-10-CM | POA: Diagnosis not present

## 2015-08-05 DIAGNOSIS — K209 Esophagitis, unspecified: Secondary | ICD-10-CM | POA: Diagnosis not present

## 2015-08-05 DIAGNOSIS — R1310 Dysphagia, unspecified: Secondary | ICD-10-CM | POA: Diagnosis not present

## 2015-08-05 DIAGNOSIS — K31819 Angiodysplasia of stomach and duodenum without bleeding: Secondary | ICD-10-CM | POA: Diagnosis not present

## 2015-08-05 DIAGNOSIS — Q2733 Arteriovenous malformation of digestive system vessel: Secondary | ICD-10-CM | POA: Diagnosis not present

## 2015-08-10 ENCOUNTER — Encounter: Payer: Self-pay | Admitting: Family Medicine

## 2015-08-10 DIAGNOSIS — M545 Low back pain, unspecified: Secondary | ICD-10-CM

## 2015-08-10 HISTORY — DX: Low back pain, unspecified: M54.50

## 2015-08-10 NOTE — Assessment & Plan Note (Signed)
Following with oncology 

## 2015-08-10 NOTE — Assessment & Plan Note (Signed)
Encouraged moist heat and gentle stretching as tolerated. May try NSAIDs and prescription meds as directed and report if symptoms worsen or seek immediate care. Add topical treatments and referred to ortho for further consideration

## 2015-08-10 NOTE — Assessment & Plan Note (Signed)
Encouraged heart healthy diet, increase exercise, avoid trans fats, consider a krill oil cap daily 

## 2015-08-10 NOTE — Assessment & Plan Note (Signed)
Encouraged to get adequate exercise, calcium and vitamin d intake 

## 2015-08-10 NOTE — Assessment & Plan Note (Signed)
Ongoing but manages with dietary adjustments, encouraged NOW probiotics daily.

## 2015-08-11 ENCOUNTER — Telehealth: Payer: Self-pay | Admitting: *Deleted

## 2015-08-11 NOTE — Telephone Encounter (Signed)
Received fax requesting medical records, paperwork is missing information as to specific documents that apply to request, the purpose of the request, and how you would like to receive your request/SLS 03/20

## 2015-08-11 NOTE — Telephone Encounter (Signed)
Patient states its personal for her own records and would like medical records mailed to home.

## 2015-08-13 DIAGNOSIS — N63 Unspecified lump in breast: Secondary | ICD-10-CM | POA: Diagnosis not present

## 2015-08-13 DIAGNOSIS — M25512 Pain in left shoulder: Secondary | ICD-10-CM | POA: Diagnosis not present

## 2015-08-13 DIAGNOSIS — N644 Mastodynia: Secondary | ICD-10-CM | POA: Diagnosis not present

## 2015-08-13 LAB — HM MAMMOGRAPHY

## 2015-08-14 ENCOUNTER — Encounter: Payer: Self-pay | Admitting: Family Medicine

## 2015-08-18 NOTE — Telephone Encounter (Signed)
Forwarded to [place] for email/scan to be mailed to patient per request [see phone note 08/11/15]/SLS 03/27

## 2015-08-25 DIAGNOSIS — M5432 Sciatica, left side: Secondary | ICD-10-CM | POA: Diagnosis not present

## 2015-08-25 DIAGNOSIS — M5442 Lumbago with sciatica, left side: Secondary | ICD-10-CM | POA: Diagnosis not present

## 2015-08-25 DIAGNOSIS — M5416 Radiculopathy, lumbar region: Secondary | ICD-10-CM | POA: Diagnosis not present

## 2015-08-25 DIAGNOSIS — M5441 Lumbago with sciatica, right side: Secondary | ICD-10-CM | POA: Diagnosis not present

## 2015-09-18 DIAGNOSIS — R69 Illness, unspecified: Secondary | ICD-10-CM | POA: Diagnosis not present

## 2015-09-22 DIAGNOSIS — M5416 Radiculopathy, lumbar region: Secondary | ICD-10-CM | POA: Diagnosis not present

## 2015-09-22 DIAGNOSIS — M5136 Other intervertebral disc degeneration, lumbar region: Secondary | ICD-10-CM | POA: Diagnosis not present

## 2015-10-14 DIAGNOSIS — R69 Illness, unspecified: Secondary | ICD-10-CM | POA: Diagnosis not present

## 2015-11-03 ENCOUNTER — Ambulatory Visit (INDEPENDENT_AMBULATORY_CARE_PROVIDER_SITE_OTHER): Payer: Medicare HMO | Admitting: Family Medicine

## 2015-11-03 ENCOUNTER — Encounter: Payer: Self-pay | Admitting: Family Medicine

## 2015-11-03 VITALS — BP 118/80 | HR 58 | Temp 98.1°F | Ht 59.0 in | Wt 124.4 lb

## 2015-11-03 DIAGNOSIS — B9681 Helicobacter pylori [H. pylori] as the cause of diseases classified elsewhere: Secondary | ICD-10-CM

## 2015-11-03 DIAGNOSIS — E785 Hyperlipidemia, unspecified: Secondary | ICD-10-CM

## 2015-11-03 DIAGNOSIS — M810 Age-related osteoporosis without current pathological fracture: Secondary | ICD-10-CM | POA: Diagnosis not present

## 2015-11-03 DIAGNOSIS — R109 Unspecified abdominal pain: Secondary | ICD-10-CM

## 2015-11-03 DIAGNOSIS — K900 Celiac disease: Secondary | ICD-10-CM

## 2015-11-03 DIAGNOSIS — I10 Essential (primary) hypertension: Secondary | ICD-10-CM

## 2015-11-03 DIAGNOSIS — A048 Other specified bacterial intestinal infections: Secondary | ICD-10-CM

## 2015-11-03 DIAGNOSIS — R197 Diarrhea, unspecified: Secondary | ICD-10-CM

## 2015-11-03 MED ORDER — HYOSCYAMINE SULFATE 0.125 MG SL SUBL: 0.1250 mg | SUBLINGUAL_TABLET | SUBLINGUAL | Status: DC | PRN

## 2015-11-03 NOTE — Progress Notes (Signed)
Pre visit review using our clinic review tool, if applicable. No additional management support is needed unless otherwise documented below in the visit note. 

## 2015-11-03 NOTE — Progress Notes (Signed)
Patient ID: Julie Wilkerson, female   DOB: 1940/11/20, 75 y.o.   MRN: [id number]   Subjective:    Patient ID: Julie Wilkerson, female    DOB: 02/17/1941, 75 y.o.   MRN: [id number]  Chief Complaint  Patient presents with  . Abdominal Pain    diarrhea  . Leg Pain    left    HPI Patient is in today for follow up. Continues to struggle with intermittent abdominal cramping and recently increased stooling frequency. Notes 4 + loose stool daily. Notes some cramping but no fevers, vomiting, melena or bloody stool. No other recent illness/ Hyoscyamine has been helpful. Denies CP/palp/SOB/HA/congestion/fevers or GU c/o. Taking meds as prescribed Past Medical History  Diagnosis Date  . Stomach cramps   . IBS (irritable bowel syndrome)   . Gluten intolerance   . Asthma   . Hyperlipidemia   . Hypertension   . Osteoporosis   . Hypothyroid   . History of Helicobacter pylori infection 08/01/2015  . History of chicken pox   . Breast cancer ([place])     2002, left, encapsulated, microcalcifications. lumpectomy, radtiation x 30  . Low back pain 08/10/2015  . Breast cancer in female [facility])     Past Surgical History  Procedure Laterality Date  . Appendectomy    . Cholecystectomy  Age 89 or 54  . Breast lumpectomy Left 2002  . Tonsillectomy    . Fracture surgery    . Abdominal hysterectomy      partial    Family History  Problem Relation Age of Onset  . Colitis Mother   . Irritable bowel syndrome Mother   . Hypertension Father   . Hyperlipidemia Brother   . Heart attack Brother   . Stomach cancer Paternal Grandmother   . Cancer Paternal Grandmother 40    stomach  . Hyperlipidemia Brother   . Heart attack Brother   . Hyperlipidemia Brother   . Heart attack Brother   . Hyperlipidemia Sister   . Arthritis Daughter   . Heart disease Daughter     ASD vs VSD    Social History   Social History  . Marital Status: Divorced    Spouse Name: N/A  . Number of Children: N/A  . Years of Education: N/A    Occupational History  . retired    Social History Main Topics  . Smoking status: Never Smoker   . Smokeless tobacco: Not on file  . Alcohol Use: No  . Drug Use: No  . Sexual Activity: Not on file     Comment: lives [place], avoids daiiry and gluten. 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 Not on file   Social History Narrative    Outpatient Prescriptions Prior to Visit  Medication Sig Dispense Refill  . alendronate (FOSAMAX) 70 MG tablet Take 70 mg by mouth once a week.    Marland Kitchen aspirin 81 MG tablet Take 81 mg by mouth daily.    . budesonide-formoterol (SYMBICORT) 80-4.5 MCG/ACT inhaler Inhale 2 puffs into the lungs 2 (two) times daily.    . citalopram (CELEXA) 10 MG tablet Take 10 mg by mouth daily.    . Collagen-Vitamin C (COLLAGEN PLUS VITAMIN C PO) Take 1 tablet by mouth daily.    Marland Kitchen levothyroxine (SYNTHROID, LEVOTHROID) 50 MCG tablet Take 50 mcg by mouth daily.    Marland Kitchen lisinopril (PRINIVIL,ZESTRIL) 20 MG tablet Take 20 mg by mouth daily.    . metoprolol succinate (TOPROL-XL) 50 MG 24 hr tablet Take  50 mg by mouth daily.    . Multiple Vitamins-Minerals (MULTIVITAMIN ADULT PO) Take 1 tablet by mouth daily.    . hyoscyamine (LEVSIN SL) 0.125 MG SL tablet Place 1 tablet (0.125 mg total) under the tongue every 4 (four) hours as needed. 40 tablet 1   No facility-administered medications prior to visit.    No Known Allergies  Review of Systems  Constitutional: Negative for fever and malaise/fatigue.  HENT: Negative for congestion.   Eyes: Negative for blurred vision.  Respiratory: Negative for shortness of breath.   Cardiovascular: Negative for chest pain, palpitations and leg swelling.  Gastrointestinal: Positive for abdominal pain and diarrhea. Negative for nausea, vomiting, constipation, blood in stool and melena.  Genitourinary: Negative for dysuria and frequency.  Musculoskeletal: Negative for falls.  Skin: Negative for rash.  Neurological: Negative for  dizziness, loss of consciousness and headaches.  Endo/Heme/Allergies: Negative for environmental allergies.  Psychiatric/Behavioral: Negative for depression. The patient is not nervous/anxious.        Objective:    Physical Exam  Constitutional: She is oriented to person, place, and time. She appears well-developed and well-nourished. No distress.  HENT:  Head: Normocephalic and atraumatic.  Eyes: Conjunctivae are normal.  Neck: Neck supple. No thyromegaly present.  Cardiovascular: Normal rate, regular rhythm and normal heart sounds.   No murmur heard. Pulmonary/Chest: Effort normal and breath sounds normal. No respiratory distress.  Abdominal: Soft. Bowel sounds are normal. She exhibits no distension and no mass. There is no tenderness.  Musculoskeletal: She exhibits no edema.  Lymphadenopathy:    She has no cervical adenopathy.  Neurological: She is alert and oriented to person, place, and time.  Skin: Skin is warm and dry.  Psychiatric: She has a normal mood and affect. Her behavior is normal.    BP 118/80 mmHg  Pulse 58  Temp(Src) 98.1 F (36.7 C) (Oral)  Ht 4\' 11"  (1.499 m)  Wt 124 lb 6 oz (56.416 kg)  BMI 25.11 kg/m2  SpO2 97% Wt Readings from Last 3 Encounters:  11/03/15 124 lb 6 oz (56.416 kg)  08/01/15 124 lb 2 oz (56.303 kg)     No results found for: WBC, HGB, HCT, PLT, GLUCOSE, CHOL, TRIG, HDL, LDLDIRECT, LDLCALC, ALT, AST, NA, K, CL, CREATININE, BUN, CO2, TSH, PSA, INR, GLUF, HGBA1C, MICROALBUR  No results found for: TSH No results found for: WBC, HGB, HCT, MCV, PLT No results found for: NA, K, CHLORIDE, CO2, GLUCOSE, BUN, CREATININE, BILITOT, ALKPHOS, AST, ALT, PROT, ALBUMIN, CALCIUM, ANIONGAP, EGFR, GFR No results found for: CHOL No results found for: HDL No results found for: LDLCALC No results found for: TRIG No results found for: CHOLHDL No results found for: 10/01/15     Assessment & Plan:   Problem List Items Addressed This Visit    None      Visit Diagnoses    Abdominal pain, unspecified abdominal location    -  Primary    Relevant Medications    hyoscyamine (LEVSIN SL) 0.125 MG SL tablet    Other Relevant Orders    Ambulatory referral to Gastroenterology    H. pylori breath test    Stool Culture    Ova and parasite examination    Fecal lactoferrin    TSH    CBC    Lipid panel    Comprehensive metabolic panel    TSH    Diarrhea, unspecified type        Relevant Medications    hyoscyamine (LEVSIN SL) 0.125  MG SL tablet    Other Relevant Orders    Ambulatory referral to Gastroenterology    H. pylori breath test    Stool Culture    Ova and parasite examination    Fecal lactoferrin    TSH    CBC    Lipid panel    Comprehensive metabolic panel    TSH    Celiac disease        Relevant Medications    hyoscyamine (LEVSIN SL) 0.125 MG SL tablet    Other Relevant Orders    Ambulatory referral to Gastroenterology    H. pylori breath test    Stool Culture    Ova and parasite examination    Fecal lactoferrin    TSH    CBC    Lipid panel    Comprehensive metabolic panel    TSH    Positive H. pylori test        Relevant Medications    hyoscyamine (LEVSIN SL) 0.125 MG SL tablet    Other Relevant Orders    Ambulatory referral to Gastroenterology    H. pylori breath test    Stool Culture    Ova and parasite examination    Fecal lactoferrin    TSH    CBC    Lipid panel    Comprehensive metabolic panel    TSH       I am having Ms. Kiernan maintain her citalopram, levothyroxine, lisinopril, alendronate, metoprolol succinate, budesonide-formoterol, Multiple Vitamins-Minerals (MULTIVITAMIN ADULT PO), Collagen-Vitamin C (COLLAGEN PLUS VITAMIN C PO), aspirin, and hyoscyamine.  Meds ordered this encounter  Medications  . hyoscyamine (LEVSIN SL) 0.125 MG SL tablet    Sig: Place 1 tablet (0.125 mg total) under the tongue every 4 (four) hours as needed.    Dispense:  40 tablet    Refill:  2     Penni Homans,  MD

## 2015-11-03 NOTE — Patient Instructions (Signed)

## 2015-11-04 ENCOUNTER — Other Ambulatory Visit: Payer: Self-pay | Admitting: Family Medicine

## 2015-11-04 DIAGNOSIS — N289 Disorder of kidney and ureter, unspecified: Secondary | ICD-10-CM

## 2015-11-04 DIAGNOSIS — D509 Iron deficiency anemia, unspecified: Secondary | ICD-10-CM

## 2015-11-04 LAB — COMPREHENSIVE METABOLIC PANEL
ALBUMIN: 4.2 g/dL (ref 3.5–5.2)
ALT: 14 U/L (ref 0–35)
AST: 24 U/L (ref 0–37)
Alkaline Phosphatase: 45 U/L (ref 39–117)
BUN: 31 mg/dL — ABNORMAL HIGH (ref 6–23)
CALCIUM: 8.9 mg/dL (ref 8.4–10.5)
CHLORIDE: 103 meq/L (ref 96–112)
CO2: 27 meq/L (ref 19–32)
Creatinine, Ser: 1.28 mg/dL — ABNORMAL HIGH (ref 0.40–1.20)
GFR: 43.33 mL/min — AB (ref >60.00)
Glucose, Bld: 95 mg/dL (ref 70–99)
Potassium: 4.9 mEq/L (ref 3.5–5.1)
Sodium: 136 mEq/L (ref 135–145)
Total Bilirubin: 0.4 mg/dL (ref 0.2–1.2)
Total Protein: 7.2 g/dL (ref 6.0–8.3)

## 2015-11-04 LAB — CBC
HEMATOCRIT: 33 % — AB (ref 36.0–46.0)
Hemoglobin: 11.1 g/dL — ABNORMAL LOW (ref 12.0–15.0)
MCHC: 33.6 g/dL (ref 30.0–36.0)
MCV: 88.5 fl (ref 78.0–100.0)
Platelets: 202 10*3/uL (ref 150.0–400.0)
RBC: 3.72 Mil/uL — AB (ref 3.87–5.11)
RDW: 14.8 % (ref 11.5–15.5)
WBC: 5.8 10*3/uL (ref 4.0–10.5)

## 2015-11-04 LAB — H. PYLORI BREATH TEST: H. PYLORI BREATH TEST: NOT DETECTED

## 2015-11-04 LAB — LIPID PANEL
CHOL/HDL RATIO: 4
CHOLESTEROL: 228 mg/dL — AB (ref 0–200)
HDL: 61.8 mg/dL (ref >39.00)
LDL CALC: 136 mg/dL — AB (ref 0–99)
NonHDL: 166.37
Triglycerides: 150 mg/dL — ABNORMAL HIGH (ref 0.0–149.0)
VLDL: 30 mg/dL (ref 0.0–40.0)

## 2015-11-04 LAB — VITAMIN D 25 HYDROXY (VIT D DEFICIENCY, FRACTURES): VITD: 22.95 ng/mL — AB (ref 30.00–100.00)

## 2015-11-04 LAB — TSH: TSH: 1.16 u[IU]/mL (ref 0.35–4.50)

## 2015-11-04 MED ORDER — VITAMIN D (ERGOCALCIFEROL) 1.25 MG (50000 UNIT) PO CAPS: 50000.0000 [IU] | ORAL_CAPSULE | ORAL | Status: DC

## 2015-11-05 ENCOUNTER — Telehealth: Payer: Self-pay | Admitting: Family Medicine

## 2015-11-05 DIAGNOSIS — R197 Diarrhea, unspecified: Secondary | ICD-10-CM | POA: Diagnosis not present

## 2015-11-05 DIAGNOSIS — R109 Unspecified abdominal pain: Secondary | ICD-10-CM | POA: Diagnosis not present

## 2015-11-05 DIAGNOSIS — B9681 Helicobacter pylori [H. pylori] as the cause of diseases classified elsewhere: Secondary | ICD-10-CM | POA: Diagnosis not present

## 2015-11-05 DIAGNOSIS — K900 Celiac disease: Secondary | ICD-10-CM | POA: Diagnosis not present

## 2015-11-05 NOTE — Telephone Encounter (Signed)
Medical records from [facility] forwarded to Dr. Charlett Blake. JG//CMA

## 2015-11-05 NOTE — Telephone Encounter (Signed)
Relation to [id number] Call back [phone]  Reason for call:  Patient dropped off medical records for PCP to review, placed in [facility].

## 2015-11-06 LAB — OVA AND PARASITE EXAMINATION: OP: NONE SEEN

## 2015-11-06 LAB — FECAL LACTOFERRIN, QUANT: LACTOFERRIN: NEGATIVE

## 2015-11-10 LAB — STOOL CULTURE

## 2015-11-11 ENCOUNTER — Encounter: Payer: Self-pay | Admitting: Family Medicine

## 2015-11-12 DIAGNOSIS — M5136 Other intervertebral disc degeneration, lumbar region: Secondary | ICD-10-CM | POA: Diagnosis not present

## 2015-11-12 DIAGNOSIS — M4806 Spinal stenosis, lumbar region: Secondary | ICD-10-CM | POA: Diagnosis not present

## 2015-11-12 DIAGNOSIS — M5416 Radiculopathy, lumbar region: Secondary | ICD-10-CM | POA: Diagnosis not present

## 2015-11-16 DIAGNOSIS — R197 Diarrhea, unspecified: Secondary | ICD-10-CM | POA: Insufficient documentation

## 2015-11-16 DIAGNOSIS — K529 Noninfective gastroenteritis and colitis, unspecified: Secondary | ICD-10-CM | POA: Insufficient documentation

## 2015-11-16 HISTORY — DX: Diarrhea, unspecified: R19.7

## 2015-11-16 NOTE — Assessment & Plan Note (Addendum)
Is complaining of 4+ loose stool daily recently. No bloody or tarry stool. Referred to gastroenterology for further consideration.

## 2015-11-16 NOTE — Assessment & Plan Note (Signed)
Encouraged heart healthy diet, increase exercise, avoid trans fats, consider a krill oil cap daily 

## 2015-11-16 NOTE — Assessment & Plan Note (Signed)
Encouraged to get adequate exercise, calcium and vitamin d intake. Has taken Fosamax previously. Will take Prolia now.

## 2015-11-16 NOTE — Assessment & Plan Note (Signed)
Well controlled, no changes to meds. Encouraged heart healthy diet such as the DASH diet and exercise as tolerated.  °

## 2015-11-18 ENCOUNTER — Telehealth: Payer: Self-pay | Admitting: *Deleted

## 2015-11-18 ENCOUNTER — Ambulatory Visit: Payer: Medicare HMO

## 2015-11-18 NOTE — Telephone Encounter (Signed)
Pt scheduled for Prolia injection today. However, we do not have any documentation of insurance authorization for the medication or any orders for Prolia, or bone density results. Called pt, she states the medication was prescribed by Vedia Coffer, PA-C w/ [facility]. Reviewed Care Everywhere, and I see phone encounter from 11/13/15 stating that they received injection and called pt to have her schedule appt for injection. Informed pt of this and she is going to call their office to have injection scheduled there. Nurse visit in our office canceled.

## 2015-11-24 ENCOUNTER — Telehealth: Payer: Self-pay | Admitting: *Deleted

## 2015-11-24 NOTE — Telephone Encounter (Signed)
PA initiated on [email or web address], awaiting determination. JG//CMA  Key: [id number]

## 2015-11-24 NOTE — Telephone Encounter (Signed)
PA approved effective 05/23/2015 through 05/23/2016 by [employer]  Referral number: [id number] Approval letter sent for scanning. JG//CMA

## 2015-11-26 ENCOUNTER — Other Ambulatory Visit (INDEPENDENT_AMBULATORY_CARE_PROVIDER_SITE_OTHER): Payer: Medicare HMO

## 2015-11-26 DIAGNOSIS — B9681 Helicobacter pylori [H. pylori] as the cause of diseases classified elsewhere: Secondary | ICD-10-CM | POA: Diagnosis not present

## 2015-11-26 DIAGNOSIS — K900 Celiac disease: Secondary | ICD-10-CM

## 2015-11-26 DIAGNOSIS — R109 Unspecified abdominal pain: Secondary | ICD-10-CM | POA: Diagnosis not present

## 2015-11-26 DIAGNOSIS — R197 Diarrhea, unspecified: Secondary | ICD-10-CM

## 2015-11-26 DIAGNOSIS — I10 Essential (primary) hypertension: Secondary | ICD-10-CM

## 2015-11-26 DIAGNOSIS — E785 Hyperlipidemia, unspecified: Secondary | ICD-10-CM

## 2015-11-26 DIAGNOSIS — M810 Age-related osteoporosis without current pathological fracture: Secondary | ICD-10-CM

## 2015-11-26 DIAGNOSIS — A048 Other specified bacterial intestinal infections: Secondary | ICD-10-CM

## 2015-11-26 LAB — FECAL OCCULT BLOOD, IMMUNOCHEMICAL: FECAL OCCULT BLD: NEGATIVE

## 2015-11-26 NOTE — Addendum Note (Signed)
Addended by: Peggyann Shoals on: 11/26/2015 12:14 PM   Modules accepted: Orders

## 2015-12-09 DIAGNOSIS — M4806 Spinal stenosis, lumbar region: Secondary | ICD-10-CM | POA: Diagnosis not present

## 2015-12-30 DIAGNOSIS — M19042 Primary osteoarthritis, left hand: Secondary | ICD-10-CM | POA: Diagnosis not present

## 2015-12-30 DIAGNOSIS — M19041 Primary osteoarthritis, right hand: Secondary | ICD-10-CM | POA: Diagnosis not present

## 2016-01-01 DIAGNOSIS — M5416 Radiculopathy, lumbar region: Secondary | ICD-10-CM | POA: Diagnosis not present

## 2016-01-01 DIAGNOSIS — M5136 Other intervertebral disc degeneration, lumbar region: Secondary | ICD-10-CM | POA: Diagnosis not present

## 2016-01-01 DIAGNOSIS — M4806 Spinal stenosis, lumbar region: Secondary | ICD-10-CM | POA: Diagnosis not present

## 2016-01-08 ENCOUNTER — Other Ambulatory Visit (INDEPENDENT_AMBULATORY_CARE_PROVIDER_SITE_OTHER): Payer: Medicare HMO

## 2016-01-08 DIAGNOSIS — N289 Disorder of kidney and ureter, unspecified: Secondary | ICD-10-CM

## 2016-01-08 DIAGNOSIS — D509 Iron deficiency anemia, unspecified: Secondary | ICD-10-CM

## 2016-01-08 LAB — COMPREHENSIVE METABOLIC PANEL
ALBUMIN: 4.1 g/dL (ref 3.5–5.2)
ALK PHOS: 42 U/L (ref 39–117)
ALT: 16 U/L (ref 0–35)
AST: 22 U/L (ref 0–37)
BILIRUBIN TOTAL: 0.6 mg/dL (ref 0.2–1.2)
BUN: 22 mg/dL (ref 6–23)
CO2: 28 mEq/L (ref 19–32)
CREATININE: 1.09 mg/dL (ref 0.40–1.20)
Calcium: 9.3 mg/dL (ref 8.4–10.5)
Chloride: 103 mEq/L (ref 96–112)
GFR: 52.13 mL/min — ABNORMAL LOW (ref >60.00)
Glucose, Bld: 108 mg/dL — ABNORMAL HIGH (ref 70–99)
Potassium: 4.6 mEq/L (ref 3.5–5.1)
SODIUM: 137 meq/L (ref 135–145)
TOTAL PROTEIN: 6.9 g/dL (ref 6.0–8.3)

## 2016-01-08 LAB — CBC WITH DIFFERENTIAL/PLATELET
BASOS ABS: 0 10*3/uL (ref 0.0–0.1)
BASOS PCT: 0.3 % (ref 0.0–3.0)
Eosinophils Absolute: 0 10*3/uL (ref 0.0–0.7)
Eosinophils Relative: 0 % (ref 0.0–5.0)
HCT: 32.5 % — ABNORMAL LOW (ref 36.0–46.0)
Hemoglobin: 11.1 g/dL — ABNORMAL LOW (ref 12.0–15.0)
LYMPHS ABS: 1.7 10*3/uL (ref 0.7–4.0)
Lymphocytes Relative: 35.1 % (ref 12.0–46.0)
MCHC: 34.2 g/dL (ref 30.0–36.0)
MCV: 88.5 fl (ref 78.0–100.0)
MONOS PCT: 12.5 % — AB (ref 3.0–12.0)
Monocytes Absolute: 0.6 10*3/uL (ref 0.1–1.0)
Neutro Abs: 2.5 10*3/uL (ref 1.4–7.7)
Neutrophils Relative %: 52.1 % (ref 43.0–77.0)
Platelets: 225 10*3/uL (ref 150.0–400.0)
RBC: 3.67 Mil/uL — ABNORMAL LOW (ref 3.87–5.11)
RDW: 14.9 % (ref 11.5–15.5)
WBC: 4.8 10*3/uL (ref 4.0–10.5)

## 2016-01-12 ENCOUNTER — Telehealth: Payer: Self-pay | Admitting: Gastroenterology

## 2016-01-12 NOTE — Telephone Encounter (Signed)
Pt is wanting to transfer from [place] to our office states she would rather be seen in our office have all her doctors together. Pt has requested to have her records reviewed by Dr.Armbruster. Records have been placed on Dr.Armbruster's desk for review

## 2016-01-16 ENCOUNTER — Encounter: Payer: Self-pay | Admitting: Gastroenterology

## 2016-01-16 NOTE — Telephone Encounter (Signed)
Dr.Armbruster has reviewed records and has accepted patient to have an office visit. Patient has been notified and scheduled for an appointment.

## 2016-01-20 DIAGNOSIS — I10 Essential (primary) hypertension: Secondary | ICD-10-CM | POA: Diagnosis not present

## 2016-01-20 DIAGNOSIS — E039 Hypothyroidism, unspecified: Secondary | ICD-10-CM | POA: Diagnosis not present

## 2016-01-20 DIAGNOSIS — M810 Age-related osteoporosis without current pathological fracture: Secondary | ICD-10-CM | POA: Diagnosis not present

## 2016-01-20 DIAGNOSIS — R69 Illness, unspecified: Secondary | ICD-10-CM | POA: Diagnosis not present

## 2016-01-20 DIAGNOSIS — Z0000 Encounter for general adult medical examination without abnormal findings: Secondary | ICD-10-CM | POA: Diagnosis not present

## 2016-02-05 ENCOUNTER — Ambulatory Visit (INDEPENDENT_AMBULATORY_CARE_PROVIDER_SITE_OTHER): Payer: Medicare HMO | Admitting: Family Medicine

## 2016-02-05 VITALS — BP 98/54 | HR 56 | Temp 97.9°F | Wt 124.6 lb

## 2016-02-05 DIAGNOSIS — I10 Essential (primary) hypertension: Secondary | ICD-10-CM

## 2016-02-05 DIAGNOSIS — Z23 Encounter for immunization: Secondary | ICD-10-CM | POA: Diagnosis not present

## 2016-02-05 DIAGNOSIS — M810 Age-related osteoporosis without current pathological fracture: Secondary | ICD-10-CM | POA: Diagnosis not present

## 2016-02-05 DIAGNOSIS — R251 Tremor, unspecified: Secondary | ICD-10-CM

## 2016-02-05 DIAGNOSIS — E785 Hyperlipidemia, unspecified: Secondary | ICD-10-CM

## 2016-02-05 DIAGNOSIS — E039 Hypothyroidism, unspecified: Secondary | ICD-10-CM | POA: Diagnosis not present

## 2016-02-05 DIAGNOSIS — R197 Diarrhea, unspecified: Secondary | ICD-10-CM

## 2016-02-05 DIAGNOSIS — K589 Irritable bowel syndrome without diarrhea: Secondary | ICD-10-CM

## 2016-02-05 LAB — COMPREHENSIVE METABOLIC PANEL
ALBUMIN: 4.1 g/dL (ref 3.5–5.2)
ALK PHOS: 47 U/L (ref 39–117)
ALT: 16 U/L (ref 0–35)
AST: 22 U/L (ref 0–37)
BILIRUBIN TOTAL: 0.5 mg/dL (ref 0.2–1.2)
BUN: 20 mg/dL (ref 6–23)
CALCIUM: 8.7 mg/dL (ref 8.4–10.5)
CO2: 29 mEq/L (ref 19–32)
Chloride: 104 mEq/L (ref 96–112)
Creatinine, Ser: 0.98 mg/dL (ref 0.40–1.20)
GFR: 58.93 mL/min — AB (ref >60.00)
GLUCOSE: 83 mg/dL (ref 70–99)
Potassium: 5 mEq/L (ref 3.5–5.1)
Sodium: 136 mEq/L (ref 135–145)
TOTAL PROTEIN: 7 g/dL (ref 6.0–8.3)

## 2016-02-05 LAB — CBC
HCT: 32.1 % — ABNORMAL LOW (ref 36.0–46.0)
Hemoglobin: 11 g/dL — ABNORMAL LOW (ref 12.0–15.0)
MCHC: 34.3 g/dL (ref 30.0–36.0)
MCV: 89.5 fl (ref 78.0–100.0)
Platelets: 208 10*3/uL (ref 150.0–400.0)
RBC: 3.59 Mil/uL — AB (ref 3.87–5.11)
RDW: 14.9 % (ref 11.5–15.5)
WBC: 5.3 10*3/uL (ref 4.0–10.5)

## 2016-02-05 LAB — LIPID PANEL
CHOLESTEROL: 225 mg/dL — AB (ref 0–200)
HDL: 60 mg/dL (ref >39.00)
LDL Cholesterol: 130 mg/dL — ABNORMAL HIGH (ref 0–99)
NonHDL: 164.73
TRIGLYCERIDES: 172 mg/dL — AB (ref 0.0–149.0)
Total CHOL/HDL Ratio: 4
VLDL: 34.4 mg/dL (ref 0.0–40.0)

## 2016-02-05 LAB — TSH: TSH: 1.43 u[IU]/mL (ref 0.35–4.50)

## 2016-02-05 LAB — VITAMIN D 25 HYDROXY (VIT D DEFICIENCY, FRACTURES): VITD: 24.44 ng/mL — AB (ref 30.00–100.00)

## 2016-02-05 MED ORDER — LISINOPRIL 20 MG PO TABS: 20.0000 mg | ORAL_TABLET | Freq: Every day | ORAL | 1 refills | Status: DC

## 2016-02-05 MED ORDER — RISEDRONATE SODIUM 150 MG PO TABS: 150.0000 mg | ORAL_TABLET | ORAL | 3 refills | Status: DC

## 2016-02-05 MED ORDER — LEVOTHYROXINE SODIUM 50 MCG PO TABS: 50.0000 ug | ORAL_TABLET | Freq: Every day | ORAL | 1 refills | Status: DC

## 2016-02-05 MED ORDER — CITALOPRAM HYDROBROMIDE 10 MG PO TABS
10.0000 mg | ORAL_TABLET | Freq: Every day | ORAL | 1 refills | Status: DC
Start: 2016-02-05 — End: 2016-08-09

## 2016-02-05 NOTE — Patient Instructions (Addendum)
Imodium as needed   Tremor A tremor is trembling or shaking that you cannot control. Most tremors affect the hands or arms. Tremors can also affect the head, vocal cords, face, and other parts of the body. There are many types of tremors. Common types include:   Essential tremor. These usually occur in people over the age of 74. It may run in families and can happen in otherwise healthy people.   Resting tremor. These occur when the muscles are at rest, such as when your hands are resting in your lap. People with Parkinson disease often have resting tremors.   Postural tremor. These occur when you try to hold a pose, such as keeping your hands outstretched.   Kinetic tremor. These occur during purposeful movement, such as trying to touch a finger to your nose.   Task-specific tremor. These may occur when you perform tasks such as handwriting, speaking, or standing.   Psychogenic tremor. These dramatically lessen or disappear when you are distracted. They can happen in people of all ages.  Some types of tremors have no known cause. Tremors can also be a symptom of nervous system problems (neurological disorders) that may occur with aging. Some tremors go away with treatment while others do not.  HOME CARE INSTRUCTIONS Watch your tremor for any changes. The following actions may help to lessen any discomfort you are feeling:  Take medicines only as directed by your health care provider.   Limit alcohol intake to no more than 1 drink per day for nonpregnant women and 2 drinks per day for men. One drink equals 12 oz of beer, 5 oz of wine, or 1 oz of hard liquor.  Do not use any tobacco products, including cigarettes, chewing tobacco, or electronic cigarettes. If you need help quitting, ask your health care provider.   Avoid extreme heat or cold.   Limit the amount of caffeine you consumeas directed by your health care provider.   Try to get 8 hours of sleep each night.  Find  ways to manage your stress, such as meditation or yoga.  Keep all follow-up visits as directed by your health care provider. This is important. SEEK MEDICAL CARE IF:  You start having a tremor after starting a new medicine.  You have tremor with other symptoms such as:  Numbness.  Tingling.  Pain.  Weakness.  Your tremor gets worse.  Your tremor interferes with your day-to-day life.   This information is not intended to replace advice given to you by your health care provider. Make sure you discuss any questions you have with your health care provider.   Document Released: 04/30/2002 Document Revised: 05/31/2014 Document Reviewed: 11/05/2013 Elsevier Interactive Patient Education [employer].

## 2016-02-05 NOTE — Progress Notes (Signed)
Pre visit review using our clinic review tool, if applicable. No additional management support is needed unless otherwise documented below in the visit note. 

## 2016-02-15 DIAGNOSIS — R251 Tremor, unspecified: Secondary | ICD-10-CM | POA: Insufficient documentation

## 2016-02-15 HISTORY — DX: Tremor, unspecified: R25.1

## 2016-02-15 NOTE — Assessment & Plan Note (Signed)
Well controlled, no changes to meds. Encouraged heart healthy diet such as the DASH diet and exercise as tolerated.  °

## 2016-02-15 NOTE — Progress Notes (Signed)
Patient ID: Julie Wilkerson, female   DOB: 1940/06/09, 75 y.o.   MRN: [id number]   Subjective:    Patient ID: Julie Wilkerson, female    DOB: 01-Jun-1940, 75 y.o.   MRN: [id number]  Chief Complaint  Patient presents with  . Follow-up    3 month follow up    HPI Patient is in today for follow up. She is noting a steady worsening of tremors in her hands, previously she noted the tremor but felt she could control it. Now she can no longer control it and it is affecting her ADLs. No falls. Notes also feeling flushed and anxious at times but denies sob, palpitations or changes in bowel patterns. She does note she feels her jaw tremor. No GI or GU complaints.  Past Medical History:  Diagnosis Date  . Asthma   . Breast cancer ([place])    2002, left, encapsulated, microcalcifications. lumpectomy, radtiation x 30  . Breast cancer in female [facility])   . Gluten intolerance   . History of chicken pox   . History of Helicobacter pylori infection 08/01/2015  . Hyperlipidemia   . Hypertension   . Hypothyroid   . IBS (irritable bowel syndrome)   . Low back pain 08/10/2015  . Osteoporosis   . Stomach cramps     Past Surgical History:  Procedure Laterality Date  . ABDOMINAL HYSTERECTOMY     partial  . APPENDECTOMY    . BREAST LUMPECTOMY Left 2002  . CHOLECYSTECTOMY  Age 39 or 13  . FRACTURE SURGERY    . TONSILLECTOMY      Family History  Problem Relation Age of Onset  . Colitis Mother   . Irritable bowel syndrome Mother   . Hypertension Father   . Hyperlipidemia Brother   . Heart attack Brother   . Stomach cancer Paternal Grandmother   . Cancer Paternal Grandmother 87    stomach  . Hyperlipidemia Brother   . Heart attack Brother   . Hyperlipidemia Brother   . Heart attack Brother   . Hyperlipidemia Sister   . Arthritis Daughter   . Heart disease Daughter     ASD vs VSD    Social History   Social History  . Marital status: Divorced    Spouse name: N/A  . Number of children: N/A  . Years  of education: N/A   Occupational History  . retired    Social History Main Topics  . Smoking status: Never Smoker  . Smokeless tobacco: Not on file  . Alcohol use No  . Drug use: No  . Sexual activity: Not on file     Comment: lives aloine, avoids daiiry and gluten. 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 Not on file   Social History Narrative  . No narrative on file    Outpatient Medications Prior to Visit  Medication Sig Dispense Refill  . aspirin 81 MG tablet Take 81 mg by mouth daily.    . budesonide-formoterol (SYMBICORT) 80-4.5 MCG/ACT inhaler Inhale 2 puffs into the lungs 2 (two) times daily.    . Collagen-Vitamin C (COLLAGEN PLUS VITAMIN C PO) Take 1 tablet by mouth daily.    . hyoscyamine (LEVSIN SL) 0.125 MG SL tablet Place 1 tablet (0.125 mg total) under the tongue every 4 (four) hours as needed. 40 tablet 2  . metoprolol succinate (TOPROL-XL) 50 MG 24 hr tablet Take 50 mg by mouth daily.    . Multiple Vitamins-Minerals (MULTIVITAMIN ADULT PO) Take 1 tablet  by mouth daily.    . Vitamin D, Ergocalciferol, (DRISDOL) 50000 units CAPS capsule Take 1 capsule (50,000 Units total) by mouth every 7 (seven) days. 4 capsule 4  . citalopram (CELEXA) 10 MG tablet Take 10 mg by mouth daily.    Marland Kitchen levothyroxine (SYNTHROID, LEVOTHROID) 50 MCG tablet Take 50 mcg by mouth daily.    Marland Kitchen lisinopril (PRINIVIL,ZESTRIL) 20 MG tablet Take 20 mg by mouth daily.     No facility-administered medications prior to visit.     No Known Allergies  Review of Systems  Constitutional: Negative for fever and malaise/fatigue.  HENT: Negative for congestion.   Eyes: Negative for blurred vision.  Respiratory: Negative for shortness of breath.   Cardiovascular: Negative for chest pain, palpitations and leg swelling.  Gastrointestinal: Negative for abdominal pain, blood in stool and nausea.  Genitourinary: Negative for dysuria and frequency.  Musculoskeletal: Negative for falls.  Skin:  Negative for rash.  Neurological: Positive for tremors. Negative for dizziness, loss of consciousness and headaches.  Endo/Heme/Allergies: Negative for environmental allergies.  Psychiatric/Behavioral: Negative for depression. The patient is nervous/anxious.        Objective:    Physical Exam  Constitutional: She is oriented to person, place, and time. She appears well-developed and well-nourished. No distress.  HENT:  Head: Normocephalic and atraumatic.  Nose: Nose normal.  Eyes: Right eye exhibits no discharge. Left eye exhibits no discharge.  Neck: Normal range of motion. Neck supple.  Cardiovascular: Normal rate and regular rhythm.   No murmur heard. Pulmonary/Chest: Effort normal and breath sounds normal.  Abdominal: Soft. Bowel sounds are normal. There is no tenderness.  Musculoskeletal: She exhibits no edema.  Neurological: She is alert and oriented to person, place, and time.  Tremor of hands R>L  Skin: Skin is warm and dry.  Psychiatric: She has a normal mood and affect.  Nursing note and vitals reviewed.   BP (!) 98/54 (BP Location: Left Arm, Patient Position: Sitting, Cuff Size: Normal)   Pulse (!) 56   Temp 97.9 F (36.6 C) (Oral)   Wt 124 lb 9.6 oz (56.5 kg)   SpO2 96%   BMI 25.17 kg/m  Wt Readings from Last 3 Encounters:  02/05/16 124 lb 9.6 oz (56.5 kg)  11/03/15 124 lb 6 oz (56.4 kg)  08/01/15 124 lb 2 oz (56.3 kg)     Lab Results  Component Value Date   WBC 5.3 02/05/2016   HGB 11.0 (L) 02/05/2016   HCT 32.1 (L) 02/05/2016   PLT 208.0 02/05/2016   GLUCOSE 83 02/05/2016   CHOL 225 (H) 02/05/2016   TRIG 172.0 (H) 02/05/2016   HDL 60.00 02/05/2016   LDLCALC 130 (H) 02/05/2016   ALT 16 02/05/2016   AST 22 02/05/2016   NA 136 02/05/2016   K 5.0 02/05/2016   CL 104 02/05/2016   CREATININE 0.98 02/05/2016   BUN 20 02/05/2016   CO2 29 02/05/2016   TSH 1.43 02/05/2016    Lab Results  Component Value Date   TSH 1.43 02/05/2016   Lab Results    Component Value Date   WBC 5.3 02/05/2016   HGB 11.0 (L) 02/05/2016   HCT 32.1 (L) 02/05/2016   MCV 89.5 02/05/2016   PLT 208.0 02/05/2016   Lab Results  Component Value Date   NA 136 02/05/2016   K 5.0 02/05/2016   CO2 29 02/05/2016   GLUCOSE 83 02/05/2016   BUN 20 02/05/2016   CREATININE 0.98 02/05/2016   BILITOT 0.5  02/05/2016   ALKPHOS 47 02/05/2016   AST 22 02/05/2016   ALT 16 02/05/2016   PROT 7.0 02/05/2016   ALBUMIN 4.1 02/05/2016   CALCIUM 8.7 02/05/2016   GFR 58.93 (L) 02/05/2016   Lab Results  Component Value Date   CHOL 225 (H) 02/05/2016   Lab Results  Component Value Date   HDL 60.00 02/05/2016   Lab Results  Component Value Date   LDLCALC 130 (H) 02/05/2016   Lab Results  Component Value Date   TRIG 172.0 (H) 02/05/2016   Lab Results  Component Value Date   CHOLHDL 4 02/05/2016   No results found for: HGBA1C     Assessment & Plan:   Problem List Items Addressed This Visit    Hyperlipidemia    Encouraged heart healthy diet, increase exercise, avoid trans fats, consider a krill oil cap daily      Relevant Medications   lisinopril (PRINIVIL,ZESTRIL) 20 MG tablet   Other Relevant Orders   Lipid panel (Completed)   Hypertension    Well controlled, no changes to meds. Encouraged heart healthy diet such as the DASH diet and exercise as tolerated.       Relevant Medications   lisinopril (PRINIVIL,ZESTRIL) 20 MG tablet   Other Relevant Orders   CBC (Completed)   TSH (Completed)   Comprehensive metabolic panel (Completed)   Osteoporosis   Relevant Medications   risedronate (ACTONEL) 150 MG tablet   Other Relevant Orders   Vitamin D (25 hydroxy) (Completed)   Hypothyroid    On Levothyroxine, continue to monitor, WNL      Relevant Medications   levothyroxine (SYNTHROID, LEVOTHROID) 50 MCG tablet   Other Relevant Orders   CBC (Completed)   TSH (Completed)   IBS (irritable bowel syndrome)   Diarrhea    GI symptoms improved with  dietary changes, probiotics       Tremor - Primary    Most notably in hands. Notes she cannot control it. She also feels flushed and anxious with the shaking. Labs unremarkable. Will refer to neurology for consideration      Relevant Orders   Ambulatory referral to Neurology   CBC (Completed)    Other Visit Diagnoses    Encounter for immunization       Relevant Medications   lisinopril (PRINIVIL,ZESTRIL) 20 MG tablet   citalopram (CELEXA) 10 MG tablet   levothyroxine (SYNTHROID, LEVOTHROID) 50 MCG tablet   risedronate (ACTONEL) 150 MG tablet   Other Relevant Orders   Flu vaccine HIGH DOSE PF (Completed)   Ambulatory referral to Neurology      I have changed Ms. Spranger's lisinopril, citalopram, and levothyroxine. I am also having her start on risedronate. Additionally, I am having her maintain her metoprolol succinate, budesonide-formoterol, Multiple Vitamins-Minerals (MULTIVITAMIN ADULT PO), Collagen-Vitamin C (COLLAGEN PLUS VITAMIN C PO), aspirin, hyoscyamine, and Vitamin D (Ergocalciferol).  Meds ordered this encounter  Medications  . lisinopril (PRINIVIL,ZESTRIL) 20 MG tablet    Sig: Take 1 tablet (20 mg total) by mouth daily.    Dispense:  90 tablet    Refill:  1  . citalopram (CELEXA) 10 MG tablet    Sig: Take 1 tablet (10 mg total) by mouth daily.    Dispense:  90 tablet    Refill:  1  . levothyroxine (SYNTHROID, LEVOTHROID) 50 MCG tablet    Sig: Take 1 tablet (50 mcg total) by mouth daily.    Dispense:  90 tablet    Refill:  1  .  risedronate (ACTONEL) 150 MG tablet    Sig: Take 1 tablet (150 mg total) by mouth every 30 (thirty) days. with water on empty stomach, nothing by mouth or lie down for next 30 minutes.    Dispense:  3 tablet    Refill:  3     Penni Homans, MD

## 2016-02-15 NOTE — Assessment & Plan Note (Signed)
Encouraged heart healthy diet, increase exercise, avoid trans fats, consider a krill oil cap daily 

## 2016-02-15 NOTE — Assessment & Plan Note (Signed)
GI symptoms improved with dietary changes, probiotics

## 2016-02-15 NOTE — Assessment & Plan Note (Signed)
On Levothyroxine, continue to monitor, WNL

## 2016-02-15 NOTE — Assessment & Plan Note (Signed)
Most notably in hands. Notes she cannot control it. She also feels flushed and anxious with the shaking. Labs unremarkable. Will refer to neurology for consideration

## 2016-03-11 NOTE — Progress Notes (Signed)
Subjective:   Julie Wilkerson was seen in consultation in the movement disorder clinic at the request of Penni Homans, MD.  The evaluation is for tremor.  Tremor started approximately 5 years ago and involves the R more than L arm, but sometimes it involves the jaw as well.  Tremor is most noticeable when she uses the arms or holds the arms the arms antigravity.   There is no family hx of tremor.    Tremor characteristics: Affected by caffeine:  unknown (doesn't drink any caffeine) Affected by alcohol:  Unknown (doesn't drink any) Affected by stress:  Yes.   Affected by fatigue:  No. Spills soup if on spoon:  May or may not Spills glass of liquid if full:  Has to hold with both hands Affects ADL's (tying shoes, brushing teeth, etc):  No.  Current/Previously tried tremor medications: metoprolol   Current medications that may exacerbate tremor:  symbicort (for very mild asthma - uses it about 3 times a month) - notes much increased tremor with this.  Also c/o L sided headache.  Has had this "since I was a little girl."  Was diagnosed as migraine as child.  Found beef as trigger and started to get better after d/c beef.  About a month ago, it got worse and felt like ice pick in eye.  Going today for eye appt.  Has phophotophobia and phonophobia.  Has nausea.  No emesis.  Takes tylenol or occasionally advil.    Neuroimaging: performed but years ago and she doesn't know results  Outside reports reviewed: historical medical records and office notes.  No Known Allergies  Outpatient Encounter Prescriptions as of 03/12/2016  Medication Sig  . aspirin 81 MG tablet Take 81 mg by mouth daily.  . budesonide-formoterol (SYMBICORT) 80-4.5 MCG/ACT inhaler Inhale 2 puffs into the lungs 2 (two) times daily.  . citalopram (CELEXA) 10 MG tablet Take 1 tablet (10 mg total) by mouth daily.  . Collagen-Vitamin C (COLLAGEN PLUS VITAMIN C PO) Take 1 tablet by mouth daily.  . hyoscyamine (LEVSIN SL) 0.125 MG  SL tablet Place 1 tablet (0.125 mg total) under the tongue every 4 (four) hours as needed.  Marland Kitchen levothyroxine (SYNTHROID, LEVOTHROID) 50 MCG tablet Take 1 tablet (50 mcg total) by mouth daily.  Marland Kitchen lisinopril (PRINIVIL,ZESTRIL) 20 MG tablet Take 1 tablet (20 mg total) by mouth daily.  . metoprolol succinate (TOPROL-XL) 50 MG 24 hr tablet Take 50 mg by mouth daily.  . Multiple Vitamins-Minerals (MULTIVITAMIN ADULT PO) Take 1 tablet by mouth daily.  . risedronate (ACTONEL) 150 MG tablet Take 1 tablet (150 mg total) by mouth every 30 (thirty) days. with water on empty stomach, nothing by mouth or lie down for next 30 minutes.  . Vitamin D, Ergocalciferol, (DRISDOL) 50000 units CAPS capsule Take 1 capsule (50,000 Units total) by mouth every 7 (seven) days.   No facility-administered encounter medications on file as of 03/12/2016.     Past Medical History:  Diagnosis Date  . Asthma   . Breast cancer ([place])    2002, left, encapsulated, microcalcifications. lumpectomy, radtiation x 30  . Breast cancer in female [facility])   . Gluten intolerance   . History of chicken pox   . History of Helicobacter pylori infection 08/01/2015  . Hyperlipidemia   . Hypertension   . Hypothyroid   . IBS (irritable bowel syndrome)   . Low back pain 08/10/2015  . Osteoporosis   . Stomach cramps     Past Surgical  History:  Procedure Laterality Date  . ABDOMINAL HYSTERECTOMY     partial  . APPENDECTOMY    . BREAST LUMPECTOMY Left 2002  . CHOLECYSTECTOMY  Age 75 or 74  . FRACTURE SURGERY    . TONSILLECTOMY      Social History   Social History  . Marital status: Divorced    Spouse name: N/A  . Number of children: N/A  . Years of education: N/A   Occupational History  . retired     Pharmacist, hospital, kindergarten   Social History Main Topics  . Smoking status: Never Smoker  . Smokeless tobacco: Never Used  . Alcohol use No  . Drug use: No  . Sexual activity: Not on file     Comment: lives aloine, avoids daiiry and  gluten. 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 Not on file   Social History Narrative  . No narrative on file    Family Status  Relation Status  . Mother Deceased  . Father Deceased  . Brother Alive  . Paternal Grandmother Deceased  . Brother Alive  . Brother Alive  . Sister Alive  . Daughter Alive  . Maternal Grandmother Deceased  . Maternal Grandfather Deceased  . Paternal Grandfather Deceased  . Daughter Alive  . Daughter Alive    Review of Systems A complete 10 system ROS was obtained and was negative apart from what is mentioned.   Objective:   VITALS:   Vitals:   03/12/16 0940  BP: 100/64  Pulse: (!) 54  Weight: 123 lb (55.8 kg)  Height: 4\' 11"  (1.499 m)   Gen:  Appears stated age and in NAD. HEENT:  Normocephalic, atraumatic. The mucous membranes are moist. The superficial temporal arteries are without ropiness or tenderness. Cardiovascular: Bradycardia with regular rhythm. Lungs: Clear to auscultation bilaterally. Neck: There are no carotid bruits noted bilaterally.  NEUROLOGICAL:  Orientation:  The patient is alert and oriented x 3.  Recent and remote memory are intact.  Attention span and concentration are normal.  Able to name objects and repeat without trouble.  Fund of knowledge is appropriate Cranial nerves: There is good facial symmetry. The pupils are equal round and reactive to light bilaterally. Fundoscopic exam reveals clear disc margins bilaterally. Extraocular muscles are intact and visual fields are full to confrontational testing. Speech is fluent and clear. Soft palate rises symmetrically and there is no tongue deviation. Hearing is intact to conversational tone. Tone: Tone is good throughout. Sensation: Sensation is intact to light touch and pinprick throughout (facial, trunk, extremities). Vibration is intact at the bilateral big toe. There is no extinction with double simultaneous stimulation. There is no sensory dermatomal  level identified. Coordination:  The patient has no dysdiadichokinesia or dysmetria. Motor: Strength is 5/5 in the bilateral upper and lower extremities.  Shoulder shrug is equal bilaterally.  There is no pronator drift.  There are no fasciculations noted. DTR's: Deep tendon reflexes are 2+/4 at the bilateral biceps, triceps, brachioradialis, patella and achilles.  Plantar responses are downgoing bilaterally. Gait and Station: The patient arises easily out of the chair.  Gait is a bit antalgic.    MOVEMENT EXAM: Tremor:  There is tremor in the UE, noted most significantly with action.  It is a little worse in the R than the L.  It is moderate in the right.   The patient is has evidence of tremor with Archimedes spirals on the right.  There is no tremor at rest.  There is intermittent chin tremor. The patient is able to pour water from one glass to another without spilling it but she actually has tremor evident but she compensates well.  Labs:  Lab Results  Component Value Date   TSH 1.43 02/05/2016     Chemistry      Component Value Date/Time   NA 136 02/05/2016 1137   K 5.0 02/05/2016 1137   CL 104 02/05/2016 1137   CO2 29 02/05/2016 1137   BUN 20 02/05/2016 1137   CREATININE 0.98 02/05/2016 1137      Component Value Date/Time   CALCIUM 8.7 02/05/2016 1137   ALKPHOS 47 02/05/2016 1137   AST 22 02/05/2016 1137   ALT 16 02/05/2016 1137   BILITOT 0.5 02/05/2016 1137          Assessment/Plan:   1.  Essential Tremor.  -This is evidenced by the symmetrical nature and longstanding hx of gradually getting worse.  We discussed nature and pathophysiology.  We discussed that this can continue to gradually get worse with time.  We discussed that some medications can worsen this, as can caffeine use.  We discussed medication therapy as well as surgical therapy.  Cannot increase her metoprolol due to bradycardia.  Try primidone. Risks, benefits, side effects and alternative therapies were  discussed.  The opportunity to ask questions was given and they were answered to the best of my ability.  The patient expressed understanding and willingness to follow the outlined treatment protocols. 2.  Migraine without aura, L eye  -long hx of migraine and this is her typical migraine but has started back after long time of being gone.  Was going to just change the metoprolol to inderal for migraine prophylaxis and tremor but too bradycardic.  Instead, will try short course of medrol and see if just can break headache cycle.  I did tell her that this will cause tremor when she is taking this.  Told her to take this before she starts the primidone.    -will do MRI brain  -if no help, we can discuss other prophylactics or abortives, if needed 3.  Follow up is anticipated in the next few months, sooner should new neurologic issues arise.  Much greater than 50% of this visit was spent in counseling and coordinating care.  Total face to face time:  45 min    CC:  Penni Homans, MD

## 2016-03-12 ENCOUNTER — Encounter: Payer: Self-pay | Admitting: Neurology

## 2016-03-12 ENCOUNTER — Ambulatory Visit (INDEPENDENT_AMBULATORY_CARE_PROVIDER_SITE_OTHER): Payer: Medicare HMO | Admitting: Neurology

## 2016-03-12 VITALS — BP 100/64 | HR 54 | Ht 59.0 in | Wt 123.0 lb

## 2016-03-12 DIAGNOSIS — G43019 Migraine without aura, intractable, without status migrainosus: Secondary | ICD-10-CM

## 2016-03-12 DIAGNOSIS — H35033 Hypertensive retinopathy, bilateral: Secondary | ICD-10-CM | POA: Diagnosis not present

## 2016-03-12 DIAGNOSIS — H5712 Ocular pain, left eye: Secondary | ICD-10-CM | POA: Diagnosis not present

## 2016-03-12 DIAGNOSIS — G250 Essential tremor: Secondary | ICD-10-CM | POA: Diagnosis not present

## 2016-03-12 MED ORDER — PRIMIDONE 50 MG PO TABS: 50.0000 mg | ORAL_TABLET | Freq: Every day | ORAL | 2 refills | Status: DC

## 2016-03-12 MED ORDER — METHYLPREDNISOLONE 4 MG PO TBPK: ORAL_TABLET | ORAL | 0 refills | Status: DC

## 2016-03-12 NOTE — Patient Instructions (Addendum)
1. We have sent a referral to [facility] for your MRI and they will call you directly to schedule your appt. They are located at [place]. If you need to contact them directly please call [phone].  2. Start Medrol Dose pack for your headache. Your tremors will get worse on this medication. Do not start Primidone for tremor until you have completed the medrol dose back.   3. After Medrol: Start Primidone 50 mg tablets. Take 1/2 tablet for 4 nights, then increase to 1 tablet. Prescription has been sent to your pharmacy. The first dose of medication can cause some dizziness/nausea that should go away after the first dose.

## 2016-03-18 ENCOUNTER — Ambulatory Visit (INDEPENDENT_AMBULATORY_CARE_PROVIDER_SITE_OTHER): Payer: Medicare HMO | Admitting: Family Medicine

## 2016-03-18 ENCOUNTER — Encounter: Payer: Self-pay | Admitting: Family Medicine

## 2016-03-18 DIAGNOSIS — C50912 Malignant neoplasm of unspecified site of left female breast: Secondary | ICD-10-CM

## 2016-03-18 MED ORDER — TRAMADOL HCL 50 MG PO TABS: 50.0000 mg | ORAL_TABLET | Freq: Three times a day (TID) | ORAL | 0 refills | Status: DC | PRN

## 2016-03-18 MED FILL — traMADol HCL 50 MG TABS: 50 | 10 days supply | Qty: 30 | Fill #0

## 2016-03-18 NOTE — Progress Notes (Signed)
Pre visit review using our clinic review tool, if applicable. No additional management support is needed unless otherwise documented below in the visit note. 

## 2016-03-18 NOTE — Patient Instructions (Signed)
Mucinex twice daily   Sinusitis, Adult Sinusitis is redness, soreness, and inflammation of the paranasal sinuses. Paranasal sinuses are air pockets within the bones of your face. They are located beneath your eyes, in the middle of your forehead, and above your eyes. In healthy paranasal sinuses, mucus is able to drain out, and air is able to circulate through them by way of your nose. However, when your paranasal sinuses are inflamed, mucus and air can become trapped. This can allow bacteria and other germs to grow and cause infection. Sinusitis can develop quickly and last only a short time (acute) or continue over a long period (chronic). Sinusitis that lasts for more than 12 weeks is considered chronic. CAUSES Causes of sinusitis include:  Allergies.  Structural abnormalities, such as displacement of the cartilage that separates your nostrils (deviated septum), which can decrease the air flow through your nose and sinuses and affect sinus drainage.  Functional abnormalities, such as when the small hairs (cilia) that line your sinuses and help remove mucus do not work properly or are not present. SIGNS AND SYMPTOMS Symptoms of acute and chronic sinusitis are the same. The primary symptoms are pain and pressure around the affected sinuses. Other symptoms include:  Upper toothache.  Earache.  Headache.  Bad breath.  Decreased sense of smell and taste.  A cough, which worsens when you are lying flat.  Fatigue.  Fever.  Thick drainage from your nose, which often is green and may contain pus (purulent).  Swelling and warmth over the affected sinuses. DIAGNOSIS Your health care provider will perform a physical exam. During your exam, your health care provider may perform any of the following to help determine if you have acute sinusitis or chronic sinusitis:  Look in your nose for signs of abnormal growths in your nostrils (nasal polyps).  Tap over the affected sinus to check  for signs of infection.  View the inside of your sinuses using an imaging device that has a light attached (endoscope). If your health care provider suspects that you have chronic sinusitis, one or more of the following tests may be recommended:  Allergy tests.  Nasal culture. A sample of mucus is taken from your nose, sent to a lab, and screened for bacteria.  Nasal cytology. A sample of mucus is taken from your nose and examined by your health care provider to determine if your sinusitis is related to an allergy. TREATMENT Most cases of acute sinusitis are related to a viral infection and will resolve on their own within 10 days. Sometimes, medicines are prescribed to help relieve symptoms of both acute and chronic sinusitis. These may include pain medicines, decongestants, nasal steroid sprays, or saline sprays. However, for sinusitis related to a bacterial infection, your health care provider will prescribe antibiotic medicines. These are medicines that will help kill the bacteria causing the infection. Rarely, sinusitis is caused by a fungal infection. In these cases, your health care provider will prescribe antifungal medicine. For some cases of chronic sinusitis, surgery is needed. Generally, these are cases in which sinusitis recurs more than 3 times per year, despite other treatments. HOME CARE INSTRUCTIONS  Drink plenty of water. Water helps thin the mucus so your sinuses can drain more easily.  Use a humidifier.  Inhale steam 3-4 times a day (for example, sit in the bathroom with the shower running).  Apply a warm, moist washcloth to your face 3-4 times a day, or as directed by your health care provider.  Use  saline nasal sprays to help moisten and clean your sinuses.  Take medicines only as directed by your health care provider.  If you were prescribed either an antibiotic or antifungal medicine, finish it all even if you start to feel better. SEEK IMMEDIATE MEDICAL CARE  IF:  You have increasing pain or severe headaches.  You have nausea, vomiting, or drowsiness.  You have swelling around your face.  You have vision problems.  You have a stiff neck.  You have difficulty breathing.   This information is not intended to replace advice given to you by your health care provider. Make sure you discuss any questions you have with your health care provider.   Document Released: 05/10/2005 Document Revised: 05/31/2014 Document Reviewed: 05/25/2011 Elsevier Interactive Patient Education [employer].

## 2016-03-18 NOTE — Progress Notes (Signed)
[facility] Yearly eye exam See report Done on 03/12/2016

## 2016-03-21 ENCOUNTER — Ambulatory Visit
Admission: RE | Admit: 2016-03-21 | Discharge: 2016-03-21 | Disposition: A | Discharge status: Home / Self Care | Payer: Medicare HMO | Source: Ambulatory Visit | Attending: Neurology | Admitting: Neurology

## 2016-03-21 DIAGNOSIS — R51 Headache: Secondary | ICD-10-CM | POA: Diagnosis not present

## 2016-03-21 DIAGNOSIS — G43019 Migraine without aura, intractable, without status migrainosus: Secondary | ICD-10-CM

## 2016-03-22 ENCOUNTER — Telehealth: Payer: Self-pay | Admitting: Neurology

## 2016-03-22 NOTE — Telephone Encounter (Signed)
-----   Message from [place], DO sent at 03/22/2016  7:41 AM EDT ----- Reviewed.  Mild small vessel disease.  Jade, let pt know that MRI looks okay.  Headache better after steroids?

## 2016-03-22 NOTE — Telephone Encounter (Signed)
Patient made aware.   Headaches are better after steroids.

## 2016-03-25 ENCOUNTER — Other Ambulatory Visit: Payer: Self-pay | Admitting: Family Medicine

## 2016-03-25 DIAGNOSIS — N649 Disorder of breast, unspecified: Secondary | ICD-10-CM

## 2016-03-30 ENCOUNTER — Ambulatory Visit (INDEPENDENT_AMBULATORY_CARE_PROVIDER_SITE_OTHER): Payer: Medicare HMO | Admitting: Gastroenterology

## 2016-03-30 ENCOUNTER — Other Ambulatory Visit (INDEPENDENT_AMBULATORY_CARE_PROVIDER_SITE_OTHER): Payer: Medicare HMO

## 2016-03-30 ENCOUNTER — Encounter: Payer: Self-pay | Admitting: Gastroenterology

## 2016-03-30 VITALS — BP 112/58 | HR 56 | Ht <= 58 in | Wt 123.5 lb

## 2016-03-30 DIAGNOSIS — K529 Noninfective gastroenteritis and colitis, unspecified: Secondary | ICD-10-CM

## 2016-03-30 DIAGNOSIS — R109 Unspecified abdominal pain: Secondary | ICD-10-CM

## 2016-03-30 DIAGNOSIS — K219 Gastro-esophageal reflux disease without esophagitis: Secondary | ICD-10-CM

## 2016-03-30 DIAGNOSIS — D649 Anemia, unspecified: Secondary | ICD-10-CM | POA: Diagnosis not present

## 2016-03-30 LAB — IGA: IGA: 272 mg/dL (ref 68–378)

## 2016-03-30 LAB — IBC PANEL
IRON: 87 ug/dL (ref 42–145)
SATURATION RATIOS: 23.3 % (ref 20.0–50.0)
Transferrin: 267 mg/dL (ref 212.0–360.0)

## 2016-03-30 LAB — B12 AND FOLATE PANEL
Folate: 14.8 ng/mL (ref >5.9)
Vitamin B-12: 768 pg/mL (ref 211–911)

## 2016-03-30 LAB — FERRITIN: FERRITIN: 50.6 ng/mL (ref 10.0–291.0)

## 2016-03-30 MED ORDER — COLESTIPOL HCL 1 G PO TABS: 1.0000 g | ORAL_TABLET | Freq: Two times a day (BID) | ORAL | 3 refills | Status: DC

## 2016-03-30 MED ORDER — RANITIDINE HCL 150 MG PO TABS: 150.0000 mg | ORAL_TABLET | Freq: Two times a day (BID) | ORAL | 3 refills | Status: DC

## 2016-03-30 NOTE — Progress Notes (Signed)
HPI :  75 y/o female with a history of breast cancer, GERD, chronic abdominal pain, and anemia, here for a new patient evaluation for abdominal pains.   She has been having some pain in her mid abdomen / epigastric area, she feels "swollen" after she eats. She reports this upper pain has been ongoing for a long time, she thinks for "several years". She feels discomfort present most of the time, particularly after she eats. She denies any vomiting. She has some baseline nausea at times. 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 She feels she has loose stools if she eats anything with gluten in it, and has been on a strict gluten free diet. She can have loose stools, she states this can come and go, but she reports not bothering her as much as previously. Reflux bothers her at night routinely. She reports she has been on omeprazole in the past but is no longer taking it. She reports she does feel better when she takes it. She reports prior omeprazole use had controlled her reflux and resolved abdominal pains. She reports she has been on zantac in the past which she did not think helped as much. She reports she has had a remote CT scan a long time ago for some of these symptoms which she did not think showed anything. She had her gallbladder removed a long time ago. She is taking hyocyamine PRN for her cramps which she thinks works quite well. She reports she had positive lab testing for celiac disease in the past, as well as H pylori. She has been on a gluten free diet and is strict with this. Vitamin D deficiency noted on labs. DEXA 10/2014 - osteopenia.   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 Ileum normal. No polyps or evidence of colitis. Biopsies not taken.  EGD 08/05/2015 - mild gastritis on path, mild esophagitis with  esophagus with normal path, normal duodenum on path  Past Medical History:  Diagnosis Date  . Asthma   . Breast cancer ([place])    2002, left, encapsulated, microcalcifications. lumpectomy, radtiation x 30  . Breast cancer in female [facility])   . Colitis   . Gluten intolerance   . History of chicken pox   . History of Helicobacter pylori infection 08/01/2015  . Hyperlipidemia   . Hypertension   . Hypothyroid   . IBS (irritable bowel syndrome)   . Low back pain 08/10/2015  . Osteoporosis   . Stomach cramps      Past Surgical History:  Procedure Laterality Date  . ABDOMINAL HYSTERECTOMY     partial  . APPENDECTOMY    . BREAST LUMPECTOMY Left 2002  . CHOLECYSTECTOMY  Age 53 or 52  . COLONOSCOPY  2017  . FRACTURE SURGERY    . TONSILLECTOMY     Family History  Problem Relation Age of Onset  . Colitis Mother   . Irritable bowel syndrome Mother   . Hypertension Father   . Hyperlipidemia Brother   . Heart attack Brother   . Stomach cancer Paternal Grandmother   . Cancer Paternal Grandmother 85    stomach  . Hyperlipidemia Brother   . Heart attack Brother   . Hyperlipidemia Brother   . Heart attack Brother   . Hyperlipidemia Sister   . Arthritis Daughter   . Heart disease  Daughter     ASD vs VSD   Social History  Substance Use Topics  . Smoking status: Never Smoker  . Smokeless tobacco: Never Used  . Alcohol use No   Current Outpatient Prescriptions  Medication Sig Dispense Refill  . aspirin 81 MG tablet Take 81 mg by mouth daily.    . budesonide-formoterol (SYMBICORT) 80-4.5 MCG/ACT inhaler Inhale 2 puffs into the lungs 2 (two) times daily.    . citalopram (CELEXA) 10 MG tablet Take 1 tablet (10 mg total) by mouth daily. 90 tablet 1  . Collagen-Vitamin C (COLLAGEN PLUS VITAMIN C PO) Take 1 tablet by mouth daily.    . hyoscyamine (LEVSIN SL) 0.125 MG SL tablet Place 1 tablet (0.125 mg total) under the tongue every 4 (four) hours as needed. 40 tablet 2  . levothyroxine  (SYNTHROID, LEVOTHROID) 50 MCG tablet Take 1 tablet (50 mcg total) by mouth daily. 90 tablet 1  . lisinopril (PRINIVIL,ZESTRIL) 20 MG tablet Take 1 tablet (20 mg total) by mouth daily. 90 tablet 1  . metoprolol succinate (TOPROL-XL) 50 MG 24 hr tablet Take 50 mg by mouth daily.    . Multiple Vitamins-Minerals (MULTIVITAMIN ADULT PO) Take 1 tablet by mouth daily.    . primidone (MYSOLINE) 50 MG tablet Take 1 tablet (50 mg total) by mouth at bedtime. 30 tablet 2  . risedronate (ACTONEL) 150 MG tablet Take 1 tablet (150 mg total) by mouth every 30 (thirty) days. with water on empty stomach, nothing by mouth or lie down for next 30 minutes. 3 tablet 3  . Vitamin D, Ergocalciferol, (DRISDOL) 50000 units CAPS capsule Take 1 capsule (50,000 Units total) by mouth every 7 (seven) days. 4 capsule 4   No current facility-administered medications for this visit.    No Known Allergies   Review of Systems: All systems reviewed and negative except where noted in HPI.    Mr Brain Wo Contrast  Result Date: 03/21/2016 CLINICAL DATA:  LEFT-sided headaches and LEFT arm tremor. History of breast cancer. EXAM: MRI HEAD WITHOUT CONTRAST TECHNIQUE: Multiplanar, multiecho pulse sequences of the brain and surrounding structures were obtained without intravenous contrast. COMPARISON:  MR brain 07/24/2015. FINDINGS: Brain: No evidence for acute infarction, hemorrhage, mass lesion, hydrocephalus, or extra-axial fluid. Global atrophy. Moderate T2 and FLAIR hyperintensities throughout the white matter, consistent with chronic microvascular ischemic change. 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 Vascular: Normal flow voids. Skull and upper cervical spine: Normal marrow signal. Sinuses/Orbits: Negative. Other: Chronic LEFT mastoid effusion. IMPRESSION: Atrophy and small vessel disease.  No acute intracranial findings. Electronically Signed   By: Staci Righter M.D.   On: 03/21/2016 19:35   Lab Results    Component Value Date   WBC 5.3 02/05/2016   HGB 11.0 (L) 02/05/2016   HCT 32.1 (L) 02/05/2016   MCV 89.5 02/05/2016   PLT 208.0 02/05/2016    Lab Results  Component Value Date   ALT 16 02/05/2016   AST 22 02/05/2016   ALKPHOS 47 02/05/2016   BILITOT 0.5 02/05/2016    Lab Results  Component Value Date   CREATININE 0.98 02/05/2016   BUN 20 02/05/2016   NA 136 02/05/2016   K 5.0 02/05/2016   CL 104 02/05/2016   CO2 29 02/05/2016      Physical Exam: BP (!) 112/58   Pulse (!) 56   Ht 4' 9.25" (1.454 m)   Wt 123 lb 8 oz (56 kg)   BMI 26.49  kg/m  Constitutional: Pleasant,well-developed,female in no acute distress. HEENT: Normocephalic and atraumatic. Conjunctivae are normal. No scleral icterus. Neck supple.  Cardiovascular: Normal rate, regular rhythm.  Pulmonary/chest: Effort normal and breath sounds normal. No wheezing, rales or rhonchi. Abdominal: Soft, nondistended, nontender. Bowel sounds active throughout. There are no masses palpable. No hepatomegaly. Extremities: no edema Lymphadenopathy: No cervical adenopathy noted. Neurological: Alert and oriented to person place and time. Skin: Skin is warm and dry. No rashes noted. Psychiatric: Normal mood and affect. Behavior is normal.   ASSESSMENT AND PLAN: 75 year old female with history as outlined above, presenting with chronic upper abdominal discomfort/intermittent loose stools. She has a mild anemia noted on labs and has a reported remote diagnosis of celiac disease made in [place] several years ago, although her duodenal biopsies on EGD in March of this year were normal without evidence of active celiac.   Given we don't have access to her remote records it unclear to me if she truly has celiac disease or just gluten intolerance, I suspect the latter. Will check TTG serology at this time, if active celiac is causing her symptoms this should be positive. We'll also evaluate her anemia with an iron panel as  well as B12 and folate levels. She otherwise clearly has reflux symptoms bothering her routinely, and this could be causing some of her epigastric pain. In light of her osteopenia, and discussion of options for reflux, we'll try her on Zantac 150 mg twice a day for a few weeks and see if this helps. If no improvement on zantac we will consider PPI. She can otherwise continue her hyoscyamine for cramps which help. For occasional loose stools we will start Colestid 1 g twice a day in light of her history of cholecystectomy and possible IBS. We will await her course and labs. Over time it will be important to clarify if she truly has celiac disease or not given it has important implications for her long-term management. Pending her TTG, we may also consider genetic testing if diagnosis remains in question.  [place] Cellar, MD [facility] Pager [phone]  CC: [name], MD

## 2016-03-30 NOTE — Patient Instructions (Signed)
If you are age 74 or older, your body mass index should be between 23-30. Your Body mass index is 26.49 kg/m. If this is out of the aforementioned range listed, please consider follow up with your Primary Care Provider.  If you are age 64 or younger, your body mass index should be between 19-25. Your Body mass index is 26.49 kg/m. If this is out of the aformentioned range listed, please consider follow up with your Primary Care Provider.   Your physician has requested that you go to the basement for the following lab work before leaving today:  We have sent the following medications to your pharmacy for you to pick up at your convenience:  Zantac 150mg  twice daily  Colestid 1g twice daily.  Continue Hyocyamine.

## 2016-03-31 LAB — TISSUE TRANSGLUTAMINASE, IGA: Tissue Transglutaminase Ab, IgA: 1 U/mL (ref <4)

## 2016-04-01 ENCOUNTER — Telehealth: Payer: Self-pay | Admitting: Neurology

## 2016-04-01 NOTE — Telephone Encounter (Signed)
Patient called - she started Primidone last night and states she is very sick. She had a lot of nausea and dizziness and it has not gone away yet today.   She states she took a whole tablet and did not split it. Patient made aware that first dose of Primidone can cause those symptoms and because she didn't half the tablet that the effects are probably stronger and lasting longer. Advised that she should not have these effects after the first dose. Also advised if she was to continue medication (which she did not want to do) that she should go back down to 1/2 tablet for 4 days.   She states she will try to continue medication. She will call with any further problems. Advised to keep hydrated.

## 2016-04-01 NOTE — Telephone Encounter (Signed)
Yes.  I had warned her about first dose effect.  Told her about cutting in 1/2.

## 2016-04-01 NOTE — Telephone Encounter (Signed)
It was also documented on her AVS.

## 2016-04-13 ENCOUNTER — Ambulatory Visit
Admission: RE | Admit: 2016-04-13 | Discharge: 2016-04-13 | Disposition: A | Discharge status: Home / Self Care | Payer: Medicare HMO | Source: Ambulatory Visit | Attending: Family Medicine | Admitting: Family Medicine

## 2016-04-13 DIAGNOSIS — N649 Disorder of breast, unspecified: Secondary | ICD-10-CM

## 2016-04-13 DIAGNOSIS — N6489 Other specified disorders of breast: Secondary | ICD-10-CM | POA: Diagnosis not present

## 2016-04-13 DIAGNOSIS — R922 Inconclusive mammogram: Secondary | ICD-10-CM | POA: Diagnosis not present

## 2016-04-29 ENCOUNTER — Encounter: Payer: Self-pay | Admitting: Family Medicine

## 2016-04-29 ENCOUNTER — Ambulatory Visit (INDEPENDENT_AMBULATORY_CARE_PROVIDER_SITE_OTHER): Payer: Medicare HMO | Admitting: Family Medicine

## 2016-04-29 ENCOUNTER — Ambulatory Visit (HOSPITAL_BASED_OUTPATIENT_CLINIC_OR_DEPARTMENT_OTHER)
Admission: RE | Admit: 2016-04-29 | Discharge: 2016-04-29 | Disposition: A | Discharge status: Home / Self Care | Payer: Medicare HMO | Source: Ambulatory Visit | Attending: Family Medicine | Admitting: Family Medicine

## 2016-04-29 VITALS — BP 122/80 | HR 56 | Temp 98.3°F | Resp 16 | Ht <= 58 in | Wt 124.2 lb

## 2016-04-29 DIAGNOSIS — M545 Low back pain: Secondary | ICD-10-CM | POA: Diagnosis not present

## 2016-04-29 DIAGNOSIS — I10 Essential (primary) hypertension: Secondary | ICD-10-CM | POA: Diagnosis not present

## 2016-04-29 DIAGNOSIS — Z0000 Encounter for general adult medical examination without abnormal findings: Secondary | ICD-10-CM | POA: Diagnosis not present

## 2016-04-29 DIAGNOSIS — M810 Age-related osteoporosis without current pathological fracture: Secondary | ICD-10-CM | POA: Diagnosis not present

## 2016-04-29 DIAGNOSIS — E782 Mixed hyperlipidemia: Secondary | ICD-10-CM

## 2016-04-29 DIAGNOSIS — M546 Pain in thoracic spine: Secondary | ICD-10-CM | POA: Insufficient documentation

## 2016-04-29 DIAGNOSIS — E559 Vitamin D deficiency, unspecified: Secondary | ICD-10-CM

## 2016-04-29 DIAGNOSIS — M4184 Other forms of scoliosis, thoracic region: Secondary | ICD-10-CM | POA: Diagnosis not present

## 2016-04-29 DIAGNOSIS — G8929 Other chronic pain: Secondary | ICD-10-CM

## 2016-04-29 DIAGNOSIS — M4185 Other forms of scoliosis, thoracolumbar region: Secondary | ICD-10-CM | POA: Insufficient documentation

## 2016-04-29 DIAGNOSIS — E039 Hypothyroidism, unspecified: Secondary | ICD-10-CM

## 2016-04-29 HISTORY — DX: Vitamin D deficiency, unspecified: E55.9

## 2016-04-29 MED ORDER — LEVALBUTEROL TARTRATE 45 MCG/ACT IN AERO: 1.0000 | INHALATION_SPRAY | Freq: Three times a day (TID) | RESPIRATORY_TRACT | 3 refills | Status: DC | PRN

## 2016-04-29 MED ORDER — RISEDRONATE SODIUM 150 MG PO TABS: 150.0000 mg | ORAL_TABLET | ORAL | 3 refills | Status: DC

## 2016-04-29 NOTE — Assessment & Plan Note (Signed)
Encouraged to start 2000 IU daily and recheck level at annual exam

## 2016-04-29 NOTE — Patient Instructions (Signed)
Preventive Care 74 Years and Older, Female Preventive care refers to lifestyle choices and visits with your health care provider that can promote health and wellness. What does preventive care include?  A yearly physical exam. This is also called an annual well check.  Dental exams once or twice a year.  Routine eye exams. Ask your health care provider how often you should have your eyes checked.  Personal lifestyle choices, including:  Daily care of your teeth and gums.  Regular physical activity.  Eating a healthy diet.  Avoiding tobacco and drug use.  Limiting alcohol use.  Practicing safe sex.  Taking low-dose aspirin every day.  Taking vitamin and mineral supplements as recommended by your health care provider. What happens during an annual well check? The services and screenings done by your health care provider during your annual well check will depend on your age, overall health, lifestyle risk factors, and family history of disease. Counseling  Your health care provider may ask you questions about your:  Alcohol use.  Tobacco use.  Drug use.  Emotional well-being.  Home and relationship well-being.  Sexual activity.  Eating habits.  History of falls.  Memory and ability to understand (cognition).  Work and work environment.  Reproductive health. Screening  You may have the following tests or measurements:  Height, weight, and BMI.  Blood pressure.  Lipid and cholesterol levels. These may be checked every 5 years, or more frequently if you are over 50 years old.  Skin check.  Lung cancer screening. You may have this screening every year starting at age 55 if you have a 30-pack-year history of smoking and currently smoke or have quit within the past 15 years.  Fecal occult blood test (FOBT) of the stool. You may have this test every year starting at age 50.  Flexible sigmoidoscopy or colonoscopy. You may have a sigmoidoscopy every 5 years or  a colonoscopy every 10 years starting at age 50.  Hepatitis C blood test.  Hepatitis B blood test.  Sexually transmitted disease (STD) testing.  Diabetes screening. This is done by checking your blood sugar (glucose) after you have not eaten for a while (fasting). You may have this done every 1-3 years.  Bone density scan. This is done to screen for osteoporosis. You may have this done starting at age 65.  Mammogram. This may be done every 1-2 years. Talk to your health care provider about how often you should have regular mammograms. Talk with your health care provider about your test results, treatment options, and if necessary, the need for more tests. Vaccines  Your health care provider may recommend certain vaccines, such as:  Influenza vaccine. This is recommended every year.  Tetanus, diphtheria, and acellular pertussis (Tdap, Td) vaccine. You may need a Td booster every 10 years.  Varicella vaccine. You may need this if you have not been vaccinated.  Zoster vaccine. You may need this after age 60.  Measles, mumps, and rubella (MMR) vaccine. You may need at least one dose of MMR if you were born in 1957 or later. You may also need a second dose.  Pneumococcal 13-valent conjugate (PCV13) vaccine. One dose is recommended after age 65.  Pneumococcal polysaccharide (PPSV23) vaccine. One dose is recommended after age 65.  Meningococcal vaccine. You may need this if you have certain conditions.  Hepatitis A vaccine. You may need this if you have certain conditions or if you travel or work in places where you may be exposed to   hepatitis A.  Hepatitis B vaccine. You may need this if you have certain conditions or if you travel or work in places where you may be exposed to hepatitis B.  Haemophilus influenzae type b (Hib) vaccine. You may need this if you have certain conditions. Talk to your health care provider about which screenings and vaccines you need and how often you need  them. This information is not intended to replace advice given to you by your health care provider. Make sure you discuss any questions you have with your health care provider. Document Released: 06/06/2015 Document Revised: 01/28/2016 Document Reviewed: 03/11/2015 Elsevier Interactive Patient Education  2017 [employer]  

## 2016-04-29 NOTE — Assessment & Plan Note (Signed)
Denies CP/palp/SOB/HA/congestion/fevers/GI or GU c/o. Taking meds as prescribed 

## 2016-04-29 NOTE — Assessment & Plan Note (Signed)
Tolerating statin, encouraged heart healthy diet, avoid trans fats, minimize simple carbs and saturated fats. Increase exercise as tolerated 

## 2016-04-29 NOTE — Assessment & Plan Note (Signed)
On Levothyroxine, continue to monitor 

## 2016-04-29 NOTE — Progress Notes (Signed)
Subjective:   Julie Wilkerson is a 75 y.o. female who presents for an Initial Medicare Annual Wellness Visit.  Review of Systems    No ROS.  Medicare Wellness Visit.   Cardiac Risk Factors include: advanced age (>47men, >64 women);hypertension;dyslipidemia Sleep patterns:  Sleeps 7-8 hrs per night. Wakes up 2-3 times per night to urinate. States she drinks a lot of water at night. Encouraged to consume most of her water earlier in the day. Feels rested when she wakes. Home Safety/Smoke Alarms:  Lives alone. 1 story home. Smoke detectors in place. Living environment; residence and Firearm Safety: No firearms. Feels safe. Seat Belt Safety/Bike Helmet: Wears seatbealt.   Counseling:   Eye Exam- Wears glasses. Can't recall eye doctor's name. Last visit 2-3 months ago per pt Dental- Dr.Powell- Visits every 6 months  Female:   Pap- Hysterectomy.      Mammo- Last on 04/13/16: BI-RADS CATEGORY  2: Benign. Per report: Return to annual screening mammography 07/2016.  Dexa scan-  Last on 11/07/14: osteopenia.2 yr f/u recommended per report. CCS- Last on 08/05/15: Esophagitis and gastritis. No future colonoscopy recommended due to age.       Objective:    Today's Vitals   04/29/16 1047  BP: 122/80  Pulse: (!) 56  Resp: 16  Temp: 98.3 F (36.8 C)  TempSrc: Oral  SpO2: 97%  Weight: 124 lb 3.2 oz (56.3 kg)  Height: 4' 9.5" (1.461 m)   Body mass index is 26.41 kg/m.   Current Medications (verified) Outpatient Encounter Prescriptions as of 04/29/2016  Medication Sig  . aspirin 81 MG tablet Take 81 mg by mouth daily.  . budesonide-formoterol (SYMBICORT) 80-4.5 MCG/ACT inhaler Inhale 2 puffs into the lungs 2 (two) times daily.  . citalopram (CELEXA) 10 MG tablet Take 1 tablet (10 mg total) by mouth daily.  . colestipol (COLESTID) 1 g tablet Take 1 tablet (1 g total) by mouth 2 (two) times daily.  . Collagen-Vitamin C (COLLAGEN PLUS VITAMIN C PO) Take 1 tablet by mouth daily.  .  hyoscyamine (LEVSIN SL) 0.125 MG SL tablet Place 1 tablet (0.125 mg total) under the tongue every 4 (four) hours as needed.  Marland Kitchen levothyroxine (SYNTHROID, LEVOTHROID) 50 MCG tablet Take 1 tablet (50 mcg total) by mouth daily.  Marland Kitchen lisinopril (PRINIVIL,ZESTRIL) 20 MG tablet Take 1 tablet (20 mg total) by mouth daily.  . metoprolol succinate (TOPROL-XL) 50 MG 24 hr tablet Take 50 mg by mouth daily.  . Multiple Vitamins-Minerals (MULTIVITAMIN ADULT PO) Take 1 tablet by mouth daily.  . ranitidine (ZANTAC) 150 MG tablet Take 1 tablet (150 mg total) by mouth 2 (two) times daily.  . Vitamin D, Ergocalciferol, (DRISDOL) 50000 units CAPS capsule Take 1 capsule (50,000 Units total) by mouth every 7 (seven) days.  . primidone (MYSOLINE) 50 MG tablet Take 1 tablet (50 mg total) by mouth at bedtime. (Patient not taking: Reported on 04/29/2016)  . risedronate (ACTONEL) 150 MG tablet Take 1 tablet (150 mg total) by mouth every 30 (thirty) days. with water on empty stomach, nothing by mouth or lie down for next 30 minutes.  . [DISCONTINUED] risedronate (ACTONEL) 150 MG tablet Take 1 tablet (150 mg total) by mouth every 30 (thirty) days. with water on empty stomach, nothing by mouth or lie down for next 30 minutes. (Patient not taking: Reported on 04/29/2016)   No facility-administered encounter medications on file as of 04/29/2016.     Allergies (verified) Patient has no known allergies.   History:  Past Medical History:  Diagnosis Date  . Asthma   . Breast cancer ([place])    2002, left, encapsulated, microcalcifications. lumpectomy, radtiation x 30  . Breast cancer in female [facility])   . Colitis   . Gluten intolerance   . History of chicken pox   . History of Helicobacter pylori infection 08/01/2015  . Hyperlipidemia   . Hypertension   . Hypothyroid   . IBS (irritable bowel syndrome)   . Low back pain 08/10/2015  . Osteoporosis   . Stomach cramps    Past Surgical History:  Procedure Laterality Date  .  ABDOMINAL HYSTERECTOMY     partial  . APPENDECTOMY    . BREAST LUMPECTOMY Left 2002  . CHOLECYSTECTOMY  Age 66 or 40  . COLONOSCOPY  2017  . FRACTURE SURGERY    . TONSILLECTOMY     Family History  Problem Relation Age of Onset  . Colitis Mother   . Irritable bowel syndrome Mother   . Hypertension Father   . Hyperlipidemia Brother   . Heart attack Brother   . Stomach cancer Paternal Grandmother   . Cancer Paternal Grandmother 63    stomach  . Hyperlipidemia Brother   . Heart attack Brother   . Hyperlipidemia Brother   . Heart attack Brother   . Hyperlipidemia Sister   . Arthritis Daughter   . Heart disease Daughter     ASD vs VSD   Social History   Occupational History  . retired     Pharmacist, hospital, kindergarten   Social History Main Topics  . Smoking status: Never Smoker  . Smokeless tobacco: Never Used  . Alcohol use No  . Drug use: No  . Sexual activity: No     Comment: lives aloine, avoids daiiry and gluten. 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    Tobacco Counseling Counseling given: Not Answered   Activities of Daily Living In your present state of health, do you have any difficulty performing the following activities: 04/29/2016  Hearing? N  Vision? N  Difficulty concentrating or making decisions? Y  Walking or climbing stairs? N  Dressing or bathing? N  Doing errands, shopping? N  Preparing Food and eating ? N  Using the Toilet? N  In the past six months, have you accidently leaked urine? Y  Do you have problems with loss of bowel control? N  Managing your Medications? N  Managing your Finances? N  Housekeeping or managing your Housekeeping? N  Some recent data might be hidden    Immunizations and Health Maintenance Immunization History  Administered Date(s) Administered  . Influenza, High Dose Seasonal PF 03/11/2015, 02/05/2016  . Pneumococcal Polysaccharide-23 05/23/2014  . Td 02/05/2016  . Zoster 05/24/2009   Health Maintenance Due  Topic Date Due  .  PNA vac Low Risk Adult (2 of 2 - PCV13) 05/24/2015    Patient Care Team: [name], MD as PCP - General (Family Medicine) Lake Bells, MD as Consulting Physician (Gastroenterology) Elisabeth Cara, PA-C as Physician Assistant (Family Medicine)  Indicate any recent Medical Services you may have received from other than [facility] providers in the past year (date may be approximate).     Assessment:   This is a routine wellness examination for Dayrin. Physical assessment deferred to PCP.   Hearing/Vision screen  Hearing Screening   125Hz  250Hz  500Hz  1000Hz  2000Hz  3000Hz  4000Hz  6000Hz  8000Hz   Right ear:   Pass Pass Pass  Pass    Left ear:   Pass Pass Pass  Pass    Comments: Able to hear conversational tones w/o difficulty. No issues reported.     Visual Acuity Screening   Right eye Left eye Both eyes  Without correction:     With correction: 20/20 20/25 20/20     Dietary issues and exercise activities discussed: Pt states she eats very healthy including a lot of fruits and vegetables. No red meat or pork. Drinks a lot of water.   Current Exercise Habits: Structured exercise class, Type of exercise: treadmill;strength training/weights, Time (Minutes): 60, Frequency (Times/Week): 3, Weekly Exercise (Minutes/Week): 180, Intensity: Mild  Goals    . Maintain healthy lifestyle      Depression Screen PHQ 2/9 Scores 04/29/2016 02/05/2016  PHQ - 2 Score 0 0    Fall Risk Fall Risk  04/29/2016 03/12/2016 02/05/2016  Falls in the past year? No No No    Cognitive Function: MMSE - Mini Mental State Exam 04/29/2016  Orientation to time 5  Orientation to Place 5  Registration 3  Attention/ Calculation 5  Recall 3  Language- name 2 objects 2  Language- repeat 1  Language- follow 3 step command 3  Language- read & follow direction 1  Write a sentence 1  Copy design 1  Total score 30        Screening Tests Health Maintenance  Topic Date Due  . PNA vac Low Risk Adult (2 of 2 -  PCV13) 05/24/2015  . MAMMOGRAM  08/12/2017  . COLONOSCOPY  08/04/2025  . TETANUS/TDAP  02/04/2026  . INFLUENZA VACCINE  Completed  . DEXA SCAN  Completed  . ZOSTAVAX  Completed      Plan:  Follow up with PCP after this appt.  Continue to eat heart healthy diet (full of fruits, vegetables, whole grains, lean protein, water--limit salt, fat, and sugar intake) and increase physical activity as tolerated.   During the course of the visit, Madelle was educated and counseled about the following appropriate screening and preventive services:   Vaccines to include Pneumoccal, Influenza, Hepatitis B, Td, Zostavax, HCV  Cardiovascular disease screening  Colorectal cancer screening  Bone density screening  Diabetes screening  Glaucoma screening  Mammography/PAP  Nutrition counseling  Smoking cessation counseling  Patient Instructions (the written plan) were given to the patient.    Shela Nevin, [place]   04/29/2016   RN AWV note reviewed. Agree with documention and plan.  Penni Homans, MD

## 2016-05-04 ENCOUNTER — Telehealth: Payer: Self-pay | Admitting: Family Medicine

## 2016-05-04 MED ORDER — METOPROLOL SUCCINATE ER 50 MG PO TB24: 50.0000 mg | ORAL_TABLET | Freq: Every day | ORAL | 1 refills | Status: DC

## 2016-05-04 NOTE — Telephone Encounter (Signed)
Refill done.  

## 2016-05-04 NOTE — Telephone Encounter (Signed)
Patient is calling to request a refill of metoprolol succinate (TOPROL-XL) 50 MG 24 hr tablet Please advise  Pharmacy:  [facility], [place].

## 2016-05-07 ENCOUNTER — Ambulatory Visit: Payer: Medicare HMO | Admitting: Family Medicine

## 2016-05-23 DIAGNOSIS — M546 Pain in thoracic spine: Secondary | ICD-10-CM

## 2016-05-23 DIAGNOSIS — G8929 Other chronic pain: Secondary | ICD-10-CM

## 2016-05-23 HISTORY — DX: Pain in thoracic spine: M54.6

## 2016-05-23 HISTORY — DX: Other chronic pain: G89.29

## 2016-05-23 NOTE — Progress Notes (Signed)
Patient ID: Julie Wilkerson, female   DOB: 28-Jun-1940, 75 y.o.   MRN: [id number]   Subjective:    Patient ID: Julie Wilkerson, female    DOB: 18-Feb-1941, 75 y.o.   MRN: [id number]  Chief Complaint  Patient presents with  . Medicare Wellness    HPI Patient is in today for follow up on numerous medical concerns after completing her annual wellness visit with RN. She feels well today. No recent illness or acute concerns. No polyuria or polydipsia. Denies CP/palp/SOB/HA/congestion/fevers/GI or GU c/o. Taking meds as prescribed  Past Medical History:  Diagnosis Date  . Asthma   . Breast cancer ([place])    2002, left, encapsulated, microcalcifications. lumpectomy, radtiation x 30  . Breast cancer in female [facility])   . Colitis   . Gluten intolerance   . History of chicken pox   . History of Helicobacter pylori infection 08/01/2015  . Hyperlipidemia   . Hypertension   . Hypothyroid   . IBS (irritable bowel syndrome)   . Low back pain 08/10/2015  . Osteoporosis   . Stomach cramps     Past Surgical History:  Procedure Laterality Date  . ABDOMINAL HYSTERECTOMY     partial  . APPENDECTOMY    . BREAST LUMPECTOMY Left 2002  . CHOLECYSTECTOMY  Age 57 or 64  . COLONOSCOPY  2017  . FRACTURE SURGERY    . TONSILLECTOMY      Family History  Problem Relation Age of Onset  . Colitis Mother   . Irritable bowel syndrome Mother   . Hypertension Father   . Hyperlipidemia Brother   . Heart attack Brother   . Stomach cancer Paternal Grandmother   . Cancer Paternal Grandmother 16    stomach  . Hyperlipidemia Brother   . Heart attack Brother   . Hyperlipidemia Brother   . Heart attack Brother   . Hyperlipidemia Sister   . Arthritis Daughter   . Heart disease Daughter     ASD vs VSD    Social History   Social History  . Marital status: Divorced    Spouse name: N/A  . Number of children: N/A  . Years of education: N/A   Occupational History  . retired     Pharmacist, hospital, kindergarten   Social  History Main Topics  . Smoking status: Never Smoker  . Smokeless tobacco: Never Used  . Alcohol use No  . Drug use: No  . Sexual activity: No     Comment: lives aloine, avoids daiiry and gluten. 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 Not on file   Social History Narrative  . No narrative on file    Outpatient Medications Prior to Visit  Medication Sig Dispense Refill  . aspirin 81 MG tablet Take 81 mg by mouth daily.    . budesonide-formoterol (SYMBICORT) 80-4.5 MCG/ACT inhaler Inhale 2 puffs into the lungs 2 (two) times daily.    . citalopram (CELEXA) 10 MG tablet Take 1 tablet (10 mg total) by mouth daily. 90 tablet 1  . colestipol (COLESTID) 1 g tablet Take 1 tablet (1 g total) by mouth 2 (two) times daily. 60 tablet 3  . Collagen-Vitamin C (COLLAGEN PLUS VITAMIN C PO) Take 1 tablet by mouth daily.    . hyoscyamine (LEVSIN SL) 0.125 MG SL tablet Place 1 tablet (0.125 mg total) under the tongue every 4 (four) hours as needed. 40 tablet 2  . levothyroxine (SYNTHROID, LEVOTHROID) 50 MCG tablet Take 1 tablet (50 mcg total)  by mouth daily. 90 tablet 1  . lisinopril (PRINIVIL,ZESTRIL) 20 MG tablet Take 1 tablet (20 mg total) by mouth daily. 90 tablet 1  . Multiple Vitamins-Minerals (MULTIVITAMIN ADULT PO) Take 1 tablet by mouth daily.    . ranitidine (ZANTAC) 150 MG tablet Take 1 tablet (150 mg total) by mouth 2 (two) times daily. 90 tablet 3  . Vitamin D, Ergocalciferol, (DRISDOL) 50000 units CAPS capsule Take 1 capsule (50,000 Units total) by mouth every 7 (seven) days. 4 capsule 4  . metoprolol succinate (TOPROL-XL) 50 MG 24 hr tablet Take 50 mg by mouth daily.    . primidone (MYSOLINE) 50 MG tablet Take 1 tablet (50 mg total) by mouth at bedtime. (Patient not taking: Reported on 04/29/2016) 30 tablet 2  . risedronate (ACTONEL) 150 MG tablet Take 1 tablet (150 mg total) by mouth every 30 (thirty) days. with water on empty stomach, nothing by mouth or lie down for next 30  minutes. (Patient not taking: Reported on 04/29/2016) 3 tablet 3   No facility-administered medications prior to visit.     No Known Allergies  Review of Systems  Constitutional: Negative for fever and malaise/fatigue.  HENT: Negative for congestion.   Eyes: Negative for blurred vision.  Respiratory: Negative for shortness of breath.   Cardiovascular: Negative for chest pain, palpitations and leg swelling.  Gastrointestinal: Negative for abdominal pain, blood in stool and nausea.  Genitourinary: Negative for dysuria and frequency.  Musculoskeletal: Negative for falls.  Skin: Negative for rash.  Neurological: Negative for dizziness, loss of consciousness and headaches.  Endo/Heme/Allergies: Negative for environmental allergies.  Psychiatric/Behavioral: Negative for depression. The patient is not nervous/anxious.        Objective:    Physical Exam  Constitutional: She is oriented to person, place, and time. She appears well-developed and well-nourished. No distress.  HENT:  Head: Normocephalic and atraumatic.  Nose: Nose normal.  Eyes: Right eye exhibits no discharge. Left eye exhibits no discharge.  Neck: Normal range of motion. Neck supple.  Cardiovascular: Normal rate and regular rhythm.   No murmur heard. Pulmonary/Chest: Effort normal and breath sounds normal.  Abdominal: Soft. Bowel sounds are normal. There is no tenderness.  Musculoskeletal: She exhibits no edema.  Neurological: She is alert and oriented to person, place, and time.  Skin: Skin is warm and dry.  Psychiatric: She has a normal mood and affect.  Nursing note and vitals reviewed.   BP 122/80 (BP Location: Right Arm, Patient Position: Sitting, Cuff Size: Normal)   Pulse (!) 56   Temp 98.3 F (36.8 C) (Oral)   Resp 16   Ht 4' 9.5" (1.461 m)   Wt 124 lb 3.2 oz (56.3 kg)   SpO2 97%   BMI 26.41 kg/m  Wt Readings from Last 3 Encounters:  04/29/16 124 lb 3.2 oz (56.3 kg)  03/30/16 123 lb 8 oz (56 kg)    03/12/16 123 lb (55.8 kg)     Lab Results  Component Value Date   WBC 5.3 02/05/2016   HGB 11.0 (L) 02/05/2016   HCT 32.1 (L) 02/05/2016   PLT 208.0 02/05/2016   GLUCOSE 83 02/05/2016   CHOL 225 (H) 02/05/2016   TRIG 172.0 (H) 02/05/2016   HDL 60.00 02/05/2016   LDLCALC 130 (H) 02/05/2016   ALT 16 02/05/2016   AST 22 02/05/2016   NA 136 02/05/2016   K 5.0 02/05/2016   CL 104 02/05/2016   CREATININE 0.98 02/05/2016   BUN 20 02/05/2016  CO2 29 02/05/2016   TSH 1.43 02/05/2016    Lab Results  Component Value Date   TSH 1.43 02/05/2016   Lab Results  Component Value Date   WBC 5.3 02/05/2016   HGB 11.0 (L) 02/05/2016   HCT 32.1 (L) 02/05/2016   MCV 89.5 02/05/2016   PLT 208.0 02/05/2016   Lab Results  Component Value Date   NA 136 02/05/2016   K 5.0 02/05/2016   CO2 29 02/05/2016   GLUCOSE 83 02/05/2016   BUN 20 02/05/2016   CREATININE 0.98 02/05/2016   BILITOT 0.5 02/05/2016   ALKPHOS 47 02/05/2016   AST 22 02/05/2016   ALT 16 02/05/2016   PROT 7.0 02/05/2016   ALBUMIN 4.1 02/05/2016   CALCIUM 8.7 02/05/2016   GFR 58.93 (L) 02/05/2016   Lab Results  Component Value Date   CHOL 225 (H) 02/05/2016   Lab Results  Component Value Date   HDL 60.00 02/05/2016   Lab Results  Component Value Date   LDLCALC 130 (H) 02/05/2016   Lab Results  Component Value Date   TRIG 172.0 (H) 02/05/2016   Lab Results  Component Value Date   CHOLHDL 4 02/05/2016   No results found for: HGBA1C     Assessment & Plan:   Problem List Items Addressed This Visit    Hyperlipidemia    Tolerating statin, encouraged heart healthy diet, avoid trans fats, minimize simple carbs and saturated fats. Increase exercise as tolerated      Relevant Orders   Lipid panel   Hypertension    Denies CP/palp/SOB/HA/congestion/fevers/GI or GU c/o. Taking meds as prescribed      Relevant Orders   CBC   Comprehensive metabolic panel   TSH   Osteoporosis    Encouraged to get  adequate exercise, calcium and vitamin d intake      Relevant Medications   risedronate (ACTONEL) 150 MG tablet   Other Relevant Orders   Vitamin D (25 hydroxy)   Hypothyroid    On Levothyroxine, continue to monitor      Low back pain    Encouraged moist heat and gentle stretching as tolerated. May try NSAIDs and prescription meds as directed and report if symptoms worsen or seek immediate care. Xray confirms scoliosis and DDD. Patient may try topical treatments if no improvement then may need further evaluation.      Vitamin D deficiency    Encouraged to start 2000 IU daily and recheck level at annual exam      Chronic bilateral thoracic back pain   Relevant Orders   DG Thoracic Spine 2 View (Completed)    Other Visit Diagnoses    Encounter for Medicare annual wellness exam    -  Primary      I am having Ms. Eshbach start on levalbuterol. I am also having her maintain her budesonide-formoterol, Multiple Vitamins-Minerals (MULTIVITAMIN ADULT PO), Collagen-Vitamin C (COLLAGEN PLUS VITAMIN C PO), aspirin, hyoscyamine, Vitamin D (Ergocalciferol), lisinopril, citalopram, levothyroxine, primidone, colestipol, ranitidine, and risedronate.  Meds ordered this encounter  Medications  . risedronate (ACTONEL) 150 MG tablet    Sig: Take 1 tablet (150 mg total) by mouth every 30 (thirty) days. with water on empty stomach, nothing by mouth or lie down for next 30 minutes.    Dispense:  3 tablet    Refill:  3  . levalbuterol (XOPENEX HFA) 45 MCG/ACT inhaler    Sig: Inhale 1-2 puffs into the lungs every 8 (eight) hours as needed for wheezing. Failed  Albuterol secondary to palpitations and tremulousness.    Dispense:  1 Inhaler    Refill:  3     Penni Homans, MD

## 2016-05-23 NOTE — Assessment & Plan Note (Signed)
Encouraged moist heat and gentle stretching as tolerated. May try NSAIDs and prescription meds as directed and report if symptoms worsen or seek immediate care. Xray confirms scoliosis and DDD. Patient may try topical treatments if no improvement then may need further evaluation.

## 2016-05-23 NOTE — Assessment & Plan Note (Signed)
Encouraged to get adequate exercise, calcium and vitamin d intake 

## 2016-06-08 ENCOUNTER — Ambulatory Visit (INDEPENDENT_AMBULATORY_CARE_PROVIDER_SITE_OTHER): Payer: Medicare HMO | Admitting: Family Medicine

## 2016-06-08 ENCOUNTER — Encounter: Payer: Self-pay | Admitting: Family Medicine

## 2016-06-08 DIAGNOSIS — C50919 Malignant neoplasm of unspecified site of unspecified female breast: Secondary | ICD-10-CM | POA: Diagnosis not present

## 2016-06-08 DIAGNOSIS — L819 Disorder of pigmentation, unspecified: Secondary | ICD-10-CM | POA: Diagnosis not present

## 2016-06-08 DIAGNOSIS — D229 Melanocytic nevi, unspecified: Secondary | ICD-10-CM

## 2016-06-08 DIAGNOSIS — M546 Pain in thoracic spine: Secondary | ICD-10-CM | POA: Diagnosis not present

## 2016-06-08 DIAGNOSIS — G8929 Other chronic pain: Secondary | ICD-10-CM

## 2016-06-08 DIAGNOSIS — I10 Essential (primary) hypertension: Secondary | ICD-10-CM | POA: Diagnosis not present

## 2016-06-08 HISTORY — DX: Melanocytic nevi, unspecified: D22.9

## 2016-06-08 NOTE — Assessment & Plan Note (Signed)
Persistent but tolerable delcines referral to PT at this time, Lidocaine patches prn help

## 2016-06-08 NOTE — Progress Notes (Signed)
Pre visit review using our clinic review tool, if applicable. No additional management support is needed unless otherwise documented below in the visit note. 

## 2016-06-08 NOTE — Assessment & Plan Note (Signed)
On posterior left shoulder, she believes it has increased in size and has darkened. Will refer to Dermatology, lesion appears to be a SK but she does not have a dermatologist for surveillance

## 2016-06-08 NOTE — Assessment & Plan Note (Signed)
Well controlled, no changes to meds. Encouraged heart healthy diet such as the DASH diet and exercise as tolerated.  °

## 2016-06-08 NOTE — Progress Notes (Signed)
Subjective:    Patient ID: Julie Wilkerson, female    DOB: [birth year], 76 y.o.   MRN: [id number]  Chief Complaint  Patient presents with  . Mole on shoulder    L shoulder.     HPI Patient is in today for mole on left shoulder. Patient states that the mole has changed colors; going from brown to black. No additional concerns noted. Does continue to have mid back pain but does find the Lidocaine patches helpful and the pain is tolerable. Has a lesion on shoulder that has darkened and grown in past year. Is awaiting repeat MGM in March. Had one sharp pain in breast lasting 3 seconds. This has not recurred. Denies CP/palp/SOB/HA/congestion/fevers/GI or GU c/o. Taking meds as prescribed  Past Medical History:  Diagnosis Date  . Asthma   . Breast cancer ([place])    2002, left, encapsulated, microcalcifications. lumpectomy, radtiation x 30  . Breast cancer in female [facility])   . Change in mole 06/08/2016  . Colitis   . Gluten intolerance   . History of chicken pox   . History of Helicobacter pylori infection 08/01/2015  . Hyperlipidemia   . Hypertension   . Hypothyroid   . IBS (irritable bowel syndrome)   . Low back pain 08/10/2015  . Osteoporosis   . Stomach cramps     Past Surgical History:  Procedure Laterality Date  . ABDOMINAL HYSTERECTOMY     partial  . APPENDECTOMY    . BREAST LUMPECTOMY Left 2002  . CHOLECYSTECTOMY  Age 60 or 6  . COLONOSCOPY  2017  . FRACTURE SURGERY    . TONSILLECTOMY      Family History  Problem Relation Age of Onset  . Colitis Mother   . Irritable bowel syndrome Mother   . Hypertension Father   . Hyperlipidemia Brother   . Heart attack Brother   . Stomach cancer Paternal Grandmother   . Cancer Paternal Grandmother 76    stomach  . Hyperlipidemia Brother   . Heart attack Brother   . Hyperlipidemia Brother   . Heart attack Brother   . Hyperlipidemia Sister   . Arthritis Daughter   . Heart disease Daughter     ASD vs VSD    Social History    Social History  . Marital status: Divorced    Spouse name: N/A  . Number of children: N/A  . Years of education: N/A   Occupational History  . retired     Pharmacist, hospital, kindergarten   Social History Main Topics  . Smoking status: Never Smoker  . Smokeless tobacco: Never Used  . Alcohol use No  . Drug use: No  . Sexual activity: No     Comment: lives [place], avoids daiiry and gluten. 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 Not on file   Social History Narrative  . No narrative on file    Outpatient Medications Prior to Visit  Medication Sig Dispense Refill  . aspirin 81 MG tablet Take 81 mg by mouth daily.    . budesonide-formoterol (SYMBICORT) 80-4.5 MCG/ACT inhaler Inhale 2 puffs into the lungs 2 (two) times daily.    . citalopram (CELEXA) 10 MG tablet Take 1 tablet (10 mg total) by mouth daily. 90 tablet 1  . colestipol (COLESTID) 1 g tablet Take 1 tablet (1 g total) by mouth 2 (two) times daily. 60 tablet 3  . Collagen-Vitamin C (COLLAGEN PLUS VITAMIN C PO) Take 1 tablet by mouth daily.    Marland Kitchen  hyoscyamine (LEVSIN SL) 0.125 MG SL tablet Place 1 tablet (0.125 mg total) under the tongue every 4 (four) hours as needed. 40 tablet 2  . levalbuterol (XOPENEX HFA) 45 MCG/ACT inhaler Inhale 1-2 puffs into the lungs every 8 (eight) hours as needed for wheezing. Failed Albuterol secondary to palpitations and tremulousness. 1 Inhaler 3  . levothyroxine (SYNTHROID, LEVOTHROID) 50 MCG tablet Take 1 tablet (50 mcg total) by mouth daily. 90 tablet 1  . lisinopril (PRINIVIL,ZESTRIL) 20 MG tablet Take 1 tablet (20 mg total) by mouth daily. 90 tablet 1  . metoprolol succinate (TOPROL-XL) 50 MG 24 hr tablet Take 1 tablet (50 mg total) by mouth daily. 90 tablet 1  . Multiple Vitamins-Minerals (MULTIVITAMIN ADULT PO) Take 1 tablet by mouth daily.    . primidone (MYSOLINE) 50 MG tablet Take 1 tablet (50 mg total) by mouth at bedtime. 30 tablet 2  . ranitidine (ZANTAC) 150 MG tablet Take 1  tablet (150 mg total) by mouth 2 (two) times daily. 90 tablet 3  . risedronate (ACTONEL) 150 MG tablet Take 1 tablet (150 mg total) by mouth every 30 (thirty) days. with water on empty stomach, nothing by mouth or lie down for next 30 minutes. 3 tablet 3  . Vitamin D, Ergocalciferol, (DRISDOL) 50000 units CAPS capsule Take 1 capsule (50,000 Units total) by mouth every 7 (seven) days. 4 capsule 4   No facility-administered medications prior to visit.     No Known Allergies  Review of Systems  Constitutional: Negative for fever and malaise/fatigue.  HENT: Negative for congestion.   Eyes: Negative for blurred vision.  Respiratory: Negative for cough and shortness of breath.   Cardiovascular: Negative for chest pain, palpitations and leg swelling.  Gastrointestinal: Negative for vomiting.  Musculoskeletal: Positive for back pain and joint pain.  Skin: Negative for rash.  Neurological: Negative for loss of consciousness and headaches.       Objective:    Physical Exam  Constitutional: She is oriented to person, place, and time. She appears well-developed and well-nourished. No distress.  HENT:  Head: Normocephalic and atraumatic.  Eyes: Conjunctivae are normal.  Neck: Normal range of motion. No thyromegaly present.  Cardiovascular: Normal rate and regular rhythm.   Pulmonary/Chest: Effort normal and breath sounds normal. She has no wheezes.  Abdominal: Soft. Bowel sounds are normal. There is no tenderness.  Musculoskeletal: She exhibits no edema or deformity.  Neurological: She is alert and oriented to person, place, and time.  Skin: Skin is warm and dry. She is not diaphoretic.  Gray irregularly shaped waxy lesion on posterior left shoulder.  Psychiatric: She has a normal mood and affect.    BP 120/70 (BP Location: Left Arm, Patient Position: Sitting, Cuff Size: Normal)   Pulse (!) 55   Temp 98.1 F (36.7 C) (Oral)   Ht 4\' 10"  (1.473 m)   Wt 124 lb 9.6 oz (56.5 kg)   SpO2  95% Comment: RA  BMI 26.04 kg/m  Wt Readings from Last 3 Encounters:  06/08/16 124 lb 9.6 oz (56.5 kg)  04/29/16 124 lb 3.2 oz (56.3 kg)  03/30/16 123 lb 8 oz (56 kg)     Lab Results  Component Value Date   WBC 5.3 02/05/2016   HGB 11.0 (L) 02/05/2016   HCT 32.1 (L) 02/05/2016   PLT 208.0 02/05/2016   GLUCOSE 83 02/05/2016   CHOL 225 (H) 02/05/2016   TRIG 172.0 (H) 02/05/2016   HDL 60.00 02/05/2016   LDLCALC 130 (H)  02/05/2016   ALT 16 02/05/2016   AST 22 02/05/2016   NA 136 02/05/2016   K 5.0 02/05/2016   CL 104 02/05/2016   CREATININE 0.98 02/05/2016   BUN 20 02/05/2016   CO2 29 02/05/2016   TSH 1.43 02/05/2016    Lab Results  Component Value Date   TSH 1.43 02/05/2016   Lab Results  Component Value Date   WBC 5.3 02/05/2016   HGB 11.0 (L) 02/05/2016   HCT 32.1 (L) 02/05/2016   MCV 89.5 02/05/2016   PLT 208.0 02/05/2016   Lab Results  Component Value Date   NA 136 02/05/2016   K 5.0 02/05/2016   CO2 29 02/05/2016   GLUCOSE 83 02/05/2016   BUN 20 02/05/2016   CREATININE 0.98 02/05/2016   BILITOT 0.5 02/05/2016   ALKPHOS 47 02/05/2016   AST 22 02/05/2016   ALT 16 02/05/2016   PROT 7.0 02/05/2016   ALBUMIN 4.1 02/05/2016   CALCIUM 8.7 02/05/2016   GFR 58.93 (L) 02/05/2016   Lab Results  Component Value Date   CHOL 225 (H) 02/05/2016   Lab Results  Component Value Date   HDL 60.00 02/05/2016   Lab Results  Component Value Date   LDLCALC 130 (H) 02/05/2016   Lab Results  Component Value Date   TRIG 172.0 (H) 02/05/2016   Lab Results  Component Value Date   CHOLHDL 4 02/05/2016   No results found for: HGBA1C    I acted as a Education administrator for Dr. Charlett Blake. Raiford Noble, RMA  Assessment & Plan:   Problem List Items Addressed This Visit    Breast cancer in female [facility] [place])    Has repeat  Mammogram in march for surveillance      Hypertension    Well controlled, no changes to meds. Encouraged heart healthy diet such as the DASH diet and exercise as  tolerated.       Chronic bilateral thoracic back pain    Persistent but tolerable delcines referral to PT at this time, Lidocaine patches prn help      Change in mole    On posterior left shoulder, she believes it has increased in size and has darkened. Will refer to Dermatology, lesion appears to be a SK but she does not have a dermatologist for surveillance      Relevant Orders   Ambulatory referral to Dermatology      I am having Ms. Antonetti maintain her budesonide-formoterol, Multiple Vitamins-Minerals (MULTIVITAMIN ADULT PO), Collagen-Vitamin C (COLLAGEN PLUS VITAMIN C PO), aspirin, hyoscyamine, Vitamin D (Ergocalciferol), lisinopril, citalopram, levothyroxine, primidone, colestipol, ranitidine, risedronate, levalbuterol, and metoprolol succinate.  No orders of the defined types were placed in this encounter.   CMA served as Education administrator during this visit. History, Physical and Plan performed by medical provider. Documentation and orders reviewed and attested to.  Penni Homans, MD

## 2016-06-08 NOTE — Patient Instructions (Signed)
Seborrheic Keratosis Seborrheic keratosis is a common, noncancerous (benign) skin growth. This condition causes waxy, rough, tan, brown, or black spots to appear on the skin. These skin growths can be flat or raised. What are the causes? The cause of this condition is not known. What increases the risk? This condition is more likely to develop in:  People who have a family history of seborrheic keratosis.  People who are 50 or older.  People who are pregnant.  People who have had estrogen replacement therapy.  What are the signs or symptoms? This condition often occurs on the face, chest, shoulders, back, or other areas. These growths:  Are usually painless, but may become irritated and itchy.  Can be yellow, brown, black, or other colors.  Are slightly raised or have a flat surface.  Are sometimes rough or wart-like in texture.  Are often waxy on the surface.  Are round or oval-shaped.  Sometimes look like they are "stuck on."  Often occur in groups, but may occur as a single growth.  How is this diagnosed? This condition is diagnosed with a medical history and physical exam. A sample of the growth may be tested (skin biopsy). You may need to see a skin specialist (dermatologist). How is this treated? Treatment is not usually needed for this condition, unless the growths are irritated or are often bleeding. You may also choose to have the growths removed if you do not like their appearance. Most commonly, these growths are treated with a procedure in which liquid nitrogen is applied to "freeze" off the growth (cryosurgery). They may also be burned off with electricity or cut off. Follow these instructions at home:  Watch your growth for any changes.  Keep all follow-up visits as told by your health care provider. This is important.  Do not scratch or pick at the growth or growths. This can cause them to become irritated or infected. Contact a health care provider  if:  You suddenly have many new growths.  Your growth bleeds, itches, or hurts.  Your growth suddenly becomes larger or changes color. This information is not intended to replace advice given to you by your health care provider. Make sure you discuss any questions you have with your health care provider. Document Released: 06/12/2010 Document Revised: 10/16/2015 Document Reviewed: 09/25/2014 Elsevier Interactive Patient Education  2017 [employer]  

## 2016-06-08 NOTE — Assessment & Plan Note (Signed)
Has repeat  Mammogram in march for surveillance

## 2016-06-16 ENCOUNTER — Encounter: Payer: Self-pay | Admitting: Family Medicine

## 2016-06-16 DIAGNOSIS — E039 Hypothyroidism, unspecified: Secondary | ICD-10-CM | POA: Diagnosis not present

## 2016-06-16 DIAGNOSIS — Z0000 Encounter for general adult medical examination without abnormal findings: Secondary | ICD-10-CM | POA: Diagnosis not present

## 2016-06-16 DIAGNOSIS — Z6826 Body mass index (BMI) 26.0-26.9, adult: Secondary | ICD-10-CM | POA: Diagnosis not present

## 2016-06-16 DIAGNOSIS — K588 Other irritable bowel syndrome: Secondary | ICD-10-CM | POA: Diagnosis not present

## 2016-06-16 DIAGNOSIS — M810 Age-related osteoporosis without current pathological fracture: Secondary | ICD-10-CM | POA: Diagnosis not present

## 2016-06-16 DIAGNOSIS — I10 Essential (primary) hypertension: Secondary | ICD-10-CM | POA: Diagnosis not present

## 2016-06-16 DIAGNOSIS — K219 Gastro-esophageal reflux disease without esophagitis: Secondary | ICD-10-CM | POA: Diagnosis not present

## 2016-06-16 DIAGNOSIS — R69 Illness, unspecified: Secondary | ICD-10-CM | POA: Diagnosis not present

## 2016-06-16 DIAGNOSIS — J4530 Mild persistent asthma, uncomplicated: Secondary | ICD-10-CM | POA: Diagnosis not present

## 2016-06-29 DIAGNOSIS — L821 Other seborrheic keratosis: Secondary | ICD-10-CM | POA: Diagnosis not present

## 2016-08-09 ENCOUNTER — Other Ambulatory Visit: Payer: Self-pay | Admitting: Family Medicine

## 2016-08-09 ENCOUNTER — Telehealth: Payer: Self-pay | Admitting: Family Medicine

## 2016-08-09 DIAGNOSIS — F32A Depression, unspecified: Secondary | ICD-10-CM

## 2016-08-09 DIAGNOSIS — F329 Major depressive disorder, single episode, unspecified: Secondary | ICD-10-CM

## 2016-08-09 MED ORDER — CITALOPRAM HYDROBROMIDE 10 MG PO TABS: 10.0000 mg | ORAL_TABLET | Freq: Every day | ORAL | 1 refills | Status: DC

## 2016-08-09 NOTE — Telephone Encounter (Signed)
Relation to pt: self  Call back number: [phone]    Reason for call:  Patient would like to schedule prolia injection, patient checked with insurance and its covered, please advise

## 2016-08-10 NOTE — Telephone Encounter (Signed)
Please advise is patient is a candidate for Prolia before I initiate benefits verification.   Also, will she need any labs prior to receiving the injections?

## 2016-08-11 NOTE — Telephone Encounter (Signed)
I reviewed her last Dexa scan from 2016 which was normal so she is not a candidate for Prolia, later this year we can repeat the dexa scan and if she developed osteoporosis then we can proceed with prolia.

## 2016-09-01 ENCOUNTER — Encounter: Payer: Self-pay | Admitting: Family Medicine

## 2016-09-01 DIAGNOSIS — Z1231 Encounter for screening mammogram for malignant neoplasm of breast: Secondary | ICD-10-CM | POA: Diagnosis not present

## 2016-09-06 ENCOUNTER — Other Ambulatory Visit: Payer: Self-pay | Admitting: Family Medicine

## 2016-11-02 ENCOUNTER — Ambulatory Visit: Payer: Medicare HMO | Admitting: Family Medicine

## 2016-11-04 ENCOUNTER — Ambulatory Visit (INDEPENDENT_AMBULATORY_CARE_PROVIDER_SITE_OTHER): Payer: Medicare HMO | Admitting: Family Medicine

## 2016-11-04 ENCOUNTER — Encounter: Payer: Self-pay | Admitting: Family Medicine

## 2016-11-04 VITALS — BP 110/58 | HR 50 | Temp 97.8°F | Resp 18 | Wt 125.2 lb

## 2016-11-04 DIAGNOSIS — E559 Vitamin D deficiency, unspecified: Secondary | ICD-10-CM | POA: Diagnosis not present

## 2016-11-04 DIAGNOSIS — R109 Unspecified abdominal pain: Secondary | ICD-10-CM

## 2016-11-04 DIAGNOSIS — K900 Celiac disease: Secondary | ICD-10-CM

## 2016-11-04 DIAGNOSIS — R300 Dysuria: Secondary | ICD-10-CM

## 2016-11-04 DIAGNOSIS — R0600 Dyspnea, unspecified: Secondary | ICD-10-CM

## 2016-11-04 DIAGNOSIS — A048 Other specified bacterial intestinal infections: Secondary | ICD-10-CM

## 2016-11-04 DIAGNOSIS — J45909 Unspecified asthma, uncomplicated: Secondary | ICD-10-CM

## 2016-11-04 DIAGNOSIS — R69 Illness, unspecified: Secondary | ICD-10-CM | POA: Diagnosis not present

## 2016-11-04 DIAGNOSIS — F32A Depression, unspecified: Secondary | ICD-10-CM

## 2016-11-04 DIAGNOSIS — F329 Major depressive disorder, single episode, unspecified: Secondary | ICD-10-CM

## 2016-11-04 DIAGNOSIS — E039 Hypothyroidism, unspecified: Secondary | ICD-10-CM

## 2016-11-04 DIAGNOSIS — M810 Age-related osteoporosis without current pathological fracture: Secondary | ICD-10-CM

## 2016-11-04 DIAGNOSIS — R739 Hyperglycemia, unspecified: Secondary | ICD-10-CM | POA: Diagnosis not present

## 2016-11-04 DIAGNOSIS — R197 Diarrhea, unspecified: Secondary | ICD-10-CM

## 2016-11-04 DIAGNOSIS — N390 Urinary tract infection, site not specified: Secondary | ICD-10-CM | POA: Insufficient documentation

## 2016-11-04 DIAGNOSIS — I10 Essential (primary) hypertension: Secondary | ICD-10-CM

## 2016-11-04 DIAGNOSIS — G479 Sleep disorder, unspecified: Secondary | ICD-10-CM

## 2016-11-04 DIAGNOSIS — C50919 Malignant neoplasm of unspecified site of unspecified female breast: Secondary | ICD-10-CM

## 2016-11-04 HISTORY — DX: Urinary tract infection, site not specified: N39.0

## 2016-11-04 HISTORY — DX: Dyspnea, unspecified: R06.00

## 2016-11-04 HISTORY — DX: Dysuria: R30.0

## 2016-11-04 LAB — LIPID PANEL
CHOLESTEROL: 219 mg/dL — AB (ref 0–200)
HDL: 63.4 mg/dL (ref >39.00)
LDL CALC: 122 mg/dL — AB (ref 0–99)
NonHDL: 155.41
TRIGLYCERIDES: 166 mg/dL — AB (ref 0.0–149.0)
Total CHOL/HDL Ratio: 3
VLDL: 33.2 mg/dL (ref 0.0–40.0)

## 2016-11-04 LAB — COMPREHENSIVE METABOLIC PANEL
ALBUMIN: 4.3 g/dL (ref 3.5–5.2)
ALT: 15 U/L (ref 0–35)
AST: 25 U/L (ref 0–37)
Alkaline Phosphatase: 54 U/L (ref 39–117)
BUN: 30 mg/dL — ABNORMAL HIGH (ref 6–23)
CALCIUM: 9.8 mg/dL (ref 8.4–10.5)
CHLORIDE: 102 meq/L (ref 96–112)
CO2: 27 meq/L (ref 19–32)
CREATININE: 1.05 mg/dL (ref 0.40–1.20)
GFR: 54.31 mL/min — AB (ref >60.00)
Glucose, Bld: 77 mg/dL (ref 70–99)
POTASSIUM: 5 meq/L (ref 3.5–5.1)
Sodium: 136 mEq/L (ref 135–145)
Total Bilirubin: 0.5 mg/dL (ref 0.2–1.2)
Total Protein: 7.2 g/dL (ref 6.0–8.3)

## 2016-11-04 LAB — EKG 12-LEAD

## 2016-11-04 LAB — CBC
HCT: 34.4 % — ABNORMAL LOW (ref 36.0–46.0)
HEMOGLOBIN: 11.6 g/dL — AB (ref 12.0–15.0)
MCHC: 33.7 g/dL (ref 30.0–36.0)
MCV: 89.7 fl (ref 78.0–100.0)
PLATELETS: 225 10*3/uL (ref 150.0–400.0)
RBC: 3.84 Mil/uL — ABNORMAL LOW (ref 3.87–5.11)
RDW: 14.1 % (ref 11.5–15.5)
WBC: 6.5 10*3/uL (ref 4.0–10.5)

## 2016-11-04 LAB — URINALYSIS, ROUTINE W REFLEX MICROSCOPIC
BILIRUBIN URINE: NEGATIVE
HGB URINE DIPSTICK: NEGATIVE
KETONES UR: NEGATIVE
NITRITE: NEGATIVE
PH: 6 (ref 5.0–8.0)
RBC / HPF: NONE SEEN (ref 0–?)
Specific Gravity, Urine: <=1.005 — AB (ref 1.000–1.030)
Total Protein, Urine: NEGATIVE
Urine Glucose: NEGATIVE
Urobilinogen, UA: 0.2 (ref 0.0–1.0)

## 2016-11-04 LAB — HEMOGLOBIN A1C: HEMOGLOBIN A1C: 6.1 % (ref 4.6–6.5)

## 2016-11-04 LAB — TSH: TSH: 2.04 u[IU]/mL (ref 0.35–4.50)

## 2016-11-04 MED ORDER — ALBUTEROL SULFATE HFA 108 (90 BASE) MCG/ACT IN AERS: 2.0000 | INHALATION_SPRAY | Freq: Four times a day (QID) | RESPIRATORY_TRACT | 0 refills | Status: DC | PRN

## 2016-11-04 MED ORDER — BECLOMETHASONE DIPROPIONATE 80 MCG/ACT IN AERS: 1.0000 | INHALATION_SPRAY | Freq: Two times a day (BID) | RESPIRATORY_TRACT | 12 refills | Status: DC | PRN

## 2016-11-04 MED ORDER — LISINOPRIL 20 MG PO TABS: 20.0000 mg | ORAL_TABLET | Freq: Every day | ORAL | 1 refills | Status: DC

## 2016-11-04 MED ORDER — CITALOPRAM HYDROBROMIDE 20 MG PO TABS: 20.0000 mg | ORAL_TABLET | Freq: Every day | ORAL | 1 refills | Status: DC

## 2016-11-04 MED ORDER — HYOSCYAMINE SULFATE 0.125 MG SL SUBL: 0.1250 mg | SUBLINGUAL_TABLET | SUBLINGUAL | 2 refills | Status: DC | PRN

## 2016-11-04 MED ORDER — VITAMIN D (ERGOCALCIFEROL) 1.25 MG (50000 UNIT) PO CAPS: 50000.0000 [IU] | ORAL_CAPSULE | ORAL | 1 refills | Status: DC

## 2016-11-04 MED ORDER — METOPROLOL SUCCINATE ER 50 MG PO TB24: 50.0000 mg | ORAL_TABLET | Freq: Every day | ORAL | 1 refills | Status: DC

## 2016-11-04 NOTE — Progress Notes (Signed)
Subjective:  I acted as a Education administrator for Dr. Charlett Blake. [name], [place]  Patient ID: Julie Wilkerson, female    DOB: 03-14-1941, 76 y.o.   MRN: [id number]  No chief complaint on file.   HPI  Patient is in today for 6 month follow up. She states she is very tired and has many concerns. She complains of increasing pain and firmness in left breast and left arm pain. She is also noting restless sleep and morning headahces. She has had an increase in dyspnea and wheezing as well. No fevers or chills. Denies CP/palp/SOBcongestion/fevers c/o. Taking meds as prescribed. She notes abdominal pain, urinary frequency, diarrhea and nausea as well. She also endorses increased anhedonia and stress. No suicidal ideation.   Patient Care Team: [name], MD as PCP - General (Family Medicine) Lake Bells., MD as Consulting Physician (Gastroenterology) Belva Bertin, Armida Sans as Physician Assistant (Family Medicine)   Past Medical History:  Diagnosis Date  . Asthma   . Breast cancer ([place])    2002, left, encapsulated, microcalcifications. lumpectomy, radtiation x 30  . Breast cancer in female [facility])   . Change in mole 06/08/2016  . Colitis   . Dyspnea 11/04/2016  . Dysuria 11/04/2016  . Gluten intolerance   . History of chicken pox   . History of Helicobacter pylori infection 08/01/2015  . Hyperlipidemia   . Hypertension   . Hypothyroid   . IBS (irritable bowel syndrome)   . Low back pain 08/10/2015  . Osteoporosis   . Stomach cramps     Past Surgical History:  Procedure Laterality Date  . ABDOMINAL HYSTERECTOMY     partial  . APPENDECTOMY    . BREAST LUMPECTOMY Left 2002  . CHOLECYSTECTOMY  Age 65 or 63  . COLONOSCOPY  2017  . FRACTURE SURGERY    . TONSILLECTOMY      Family History  Problem Relation Age of Onset  . Colitis Mother   . Irritable bowel syndrome Mother   . Hypertension Father   . Hyperlipidemia Brother   . Heart attack Brother   . Stomach cancer Paternal Grandmother   . Cancer  Paternal Grandmother 43       stomach  . Hyperlipidemia Brother   . Heart attack Brother   . Hyperlipidemia Brother   . Heart attack Brother   . Hyperlipidemia Sister   . Arthritis Daughter   . Heart disease Daughter        ASD vs VSD    Social History   Social History  . Marital status: Divorced    Spouse name: N/A  . Number of children: N/A  . Years of education: N/A   Occupational History  . retired     Pharmacist, hospital, kindergarten   Social History Main Topics  . Smoking status: Never Smoker  . Smokeless tobacco: Never Used  . Alcohol use No  . Drug use: No  . Sexual activity: No     Comment: lives [place], avoids daiiry and gluten. 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 Not on file   Social History Narrative  . No narrative on file    Outpatient Medications Prior to Visit  Medication Sig Dispense Refill  . aspirin 81 MG tablet Take 81 mg by mouth daily.    . colestipol (COLESTID) 1 g tablet Take 1 tablet (1 g total) by mouth 2 (two) times daily. 60 tablet 3  . Collagen-Vitamin C (COLLAGEN PLUS VITAMIN C PO) Take 1 tablet  by mouth daily.    Marland Kitchen levalbuterol (XOPENEX HFA) 45 MCG/ACT inhaler Inhale 1-2 puffs into the lungs every 8 (eight) hours as needed for wheezing. Failed Albuterol secondary to palpitations and tremulousness. 1 Inhaler 3  . levothyroxine (SYNTHROID, LEVOTHROID) 50 MCG tablet Take 1 tablet (50 mcg total) by mouth daily. 90 tablet 1  . Multiple Vitamins-Minerals (MULTIVITAMIN ADULT PO) Take 1 tablet by mouth daily.    . primidone (MYSOLINE) 50 MG tablet Take 1 tablet (50 mg total) by mouth at bedtime. 30 tablet 2  . ranitidine (ZANTAC) 150 MG tablet Take 1 tablet (150 mg total) by mouth 2 (two) times daily. 90 tablet 3  . budesonide-formoterol (SYMBICORT) 80-4.5 MCG/ACT inhaler Inhale 2 puffs into the lungs 2 (two) times daily.    . citalopram (CELEXA) 10 MG tablet Take 1 tablet (10 mg total) by mouth daily. 90 tablet 1  . hyoscyamine (LEVSIN  SL) 0.125 MG SL tablet Place 1 tablet (0.125 mg total) under the tongue every 4 (four) hours as needed. 40 tablet 2  . lisinopril (PRINIVIL,ZESTRIL) 20 MG tablet TAKE ONE TABLET BY MOUTH ONCE DAILY 90 tablet 1  . metoprolol succinate (TOPROL-XL) 50 MG 24 hr tablet Take 1 tablet (50 mg total) by mouth daily. 90 tablet 1  . risedronate (ACTONEL) 150 MG tablet Take 1 tablet (150 mg total) by mouth every 30 (thirty) days. with water on empty stomach, nothing by mouth or lie down for next 30 minutes. 3 tablet 3  . Vitamin D, Ergocalciferol, (DRISDOL) 50000 units CAPS capsule Take 1 capsule (50,000 Units total) by mouth every 7 (seven) days. 4 capsule 4   No facility-administered medications prior to visit.     No Known Allergies  Review of Systems  Constitutional: Positive for malaise/fatigue. Negative for fever.  HENT: Negative for congestion.   Eyes: Negative for blurred vision.  Respiratory: Negative for cough and shortness of breath.   Cardiovascular: Negative for chest pain, palpitations and leg swelling.  Gastrointestinal: Positive for diarrhea and nausea. Negative for blood in stool, melena and vomiting.  Musculoskeletal: Positive for joint pain. Negative for back pain.  Skin: Negative for rash.  Neurological: Negative for loss of consciousness and headaches.       Objective:    Physical Exam  Constitutional: She is oriented to person, place, and time. She appears well-developed and well-nourished. No distress.  HENT:  Head: Normocephalic and atraumatic.  Eyes: Conjunctivae are normal.  Neck: Normal range of motion. No thyromegaly present.  Cardiovascular: Normal rate and regular rhythm.   Pulmonary/Chest: Effort normal and breath sounds normal. She has no wheezes.  Abdominal: Soft. Bowel sounds are normal. There is no tenderness.  Musculoskeletal: Normal range of motion. She exhibits no edema or deformity.  Neurological: She is alert and oriented to person, place, and time.    Skin: Skin is warm and dry. She is not diaphoretic.  Psychiatric: She has a normal mood and affect.    BP (!) 110/58 (BP Location: Left Arm, Patient Position: Sitting, Cuff Size: Normal)   Pulse (!) 50   Temp 97.8 F (36.6 C) (Oral)   Resp 18   Wt 125 lb 3.2 oz (56.8 kg)   SpO2 93%   BMI 26.17 kg/m  Wt Readings from Last 3 Encounters:  11/04/16 125 lb 3.2 oz (56.8 kg)  06/08/16 124 lb 9.6 oz (56.5 kg)  04/29/16 124 lb 3.2 oz (56.3 kg)   BP Readings from Last 3 Encounters:  11/04/16 (!) 110/58  06/08/16 120/70  04/29/16 122/80     Immunization History  Administered Date(s) Administered  . Influenza, High Dose Seasonal PF 03/11/2015, 02/05/2016  . Pneumococcal Polysaccharide-23 05/23/2014  . Td 02/05/2016  . Zoster 05/24/2009    Health Maintenance  Topic Date Due  . PNA vac Low Risk Adult (2 of 2 - PCV13) 05/24/2015  . INFLUENZA VACCINE  12/22/2016  . MAMMOGRAM  09/02/2018  . COLONOSCOPY  08/04/2025  . TETANUS/TDAP  02/04/2026  . DEXA SCAN  Completed    Lab Results  Component Value Date   WBC 6.5 11/04/2016   HGB 11.6 (L) 11/04/2016   HCT 34.4 (L) 11/04/2016   PLT 225.0 11/04/2016   GLUCOSE 77 11/04/2016   CHOL 219 (H) 11/04/2016   TRIG 166.0 (H) 11/04/2016   HDL 63.40 11/04/2016   LDLCALC 122 (H) 11/04/2016   ALT 15 11/04/2016   AST 25 11/04/2016   NA 136 11/04/2016   K 5.0 11/04/2016   CL 102 11/04/2016   CREATININE 1.05 11/04/2016   BUN 30 (H) 11/04/2016   CO2 27 11/04/2016   TSH 2.04 11/04/2016   HGBA1C 6.1 11/04/2016    Lab Results  Component Value Date   TSH 2.04 11/04/2016   Lab Results  Component Value Date   WBC 6.5 11/04/2016   HGB 11.6 (L) 11/04/2016   HCT 34.4 (L) 11/04/2016   MCV 89.7 11/04/2016   PLT 225.0 11/04/2016   Lab Results  Component Value Date   NA 136 11/04/2016   K 5.0 11/04/2016   CO2 27 11/04/2016   GLUCOSE 77 11/04/2016   BUN 30 (H) 11/04/2016   CREATININE 1.05 11/04/2016   BILITOT 0.5 11/04/2016    ALKPHOS 54 11/04/2016   AST 25 11/04/2016   ALT 15 11/04/2016   PROT 7.2 11/04/2016   ALBUMIN 4.3 11/04/2016   CALCIUM 9.8 11/04/2016   GFR 54.31 (L) 11/04/2016   Lab Results  Component Value Date   CHOL 219 (H) 11/04/2016   Lab Results  Component Value Date   HDL 63.40 11/04/2016   Lab Results  Component Value Date   LDLCALC 122 (H) 11/04/2016   Lab Results  Component Value Date   TRIG 166.0 (H) 11/04/2016   Lab Results  Component Value Date   CHOLHDL 3 11/04/2016   Lab Results  Component Value Date   HGBA1C 6.1 11/04/2016         Assessment & Plan:   Problem List Items Addressed This Visit    Asthma    Struggling with increased symptoms and dyspnea, will refer her to pulmonology for further consideration.       Relevant Medications   albuterol (PROVENTIL HFA;VENTOLIN HFA) 108 (90 Base) MCG/ACT inhaler   beclomethasone (QVAR) 80 MCG/ACT inhaler   Other Relevant Orders   Ambulatory referral to Pulmonology   Breast cancer in female [facility])    She notes several months of her left breast growing and becoming increasingly pain especially at 4 oclock. Repeat MGM today and consider a breast surgery consult if pain persists.      Relevant Orders   MM DIAG BREAST TOMO UNI LEFT   Hypertension     Well controlled, no changes to meds. Encouraged heart healthy diet such as the DASH diet and exercise as tolerated.       Relevant Medications   lisinopril (PRINIVIL,ZESTRIL) 20 MG tablet   metoprolol succinate (TOPROL-XL) 50 MG 24 hr tablet   Osteoporosis     which is thinner than  normal but not as bad as osteoporosis. Recommend calcium intake of 1200 to 1500 mg daily, divided into roughly 3 doses. Best source is the diet and a single dairy serving is about 500 mg, a supplement of calcium citrate once or twice daily to balance diet is fine if not getting enough in diet. Also need Vitamin D 2000 IU caps, 1 cap daily if not already taking vitamin D. Also recommend weight  baring exercise on hips and upper body to keep bones strong      Relevant Medications   Vitamin D, Ergocalciferol, (DRISDOL) 50000 units CAPS capsule   Hypothyroid - Primary    On Levothyroxine, continue to monitor      Relevant Medications   metoprolol succinate (TOPROL-XL) 50 MG 24 hr tablet   Other Relevant Orders   TSH (Completed)   Diarrhea   Relevant Medications   hyoscyamine (LEVSIN SL) 0.125 MG SL tablet   Vitamin D deficiency    Check level today      Dysuria    Check a urinalysis and culture which shows an [facility] UTI treated with antibiotics, probiotics, cranberry tabs      Relevant Orders   CBC (Completed)   Comprehensive metabolic panel (Completed)   Urine Culture (Completed)   Urinalysis   RESOLVED: Dyspnea   Relevant Orders   Ambulatory referral to Pulmonology   EKG 12-Lead (Completed)   ECHOCARDIOGRAM COMPLETE   Hyperglycemia     minimize simple carbs. Increase exercise as tolerated.       Relevant Orders   Lipid panel (Completed)   Hemoglobin A1c (Completed)   Restless sleeper    With am Headaches, fatigue and HTN referred to pulmonology for possible sleep study.      Relevant Orders   Ambulatory referral to Pulmonology   Abdominal pain    H Pylori is negative, report worsening symptoms.      Relevant Medications   hyoscyamine (LEVSIN SL) 0.125 MG SL tablet    Other Visit Diagnoses    Celiac disease       Relevant Medications   hyoscyamine (LEVSIN SL) 0.125 MG SL tablet   Positive H. pylori test       Relevant Medications   hyoscyamine (LEVSIN SL) 0.125 MG SL tablet   Depression, unspecified depression type       Relevant Medications   citalopram (CELEXA) 20 MG tablet      I have discontinued Ms. Trostle's budesonide-formoterol, risedronate, and citalopram. I have also changed her lisinopril. Additionally, I am having her start on albuterol, beclomethasone, and citalopram. Lastly, I am having her maintain her Multiple Vitamins-Minerals  (MULTIVITAMIN ADULT PO), Collagen-Vitamin C (COLLAGEN PLUS VITAMIN C PO), aspirin, levothyroxine, primidone, colestipol, ranitidine, levalbuterol, hyoscyamine, metoprolol succinate, and Vitamin D (Ergocalciferol).  Meds ordered this encounter  Medications  . lisinopril (PRINIVIL,ZESTRIL) 20 MG tablet    Sig: Take 1 tablet (20 mg total) by mouth daily.    Dispense:  90 tablet    Refill:  1  . hyoscyamine (LEVSIN SL) 0.125 MG SL tablet    Sig: Place 1 tablet (0.125 mg total) under the tongue every 4 (four) hours as needed.    Dispense:  40 tablet    Refill:  2  . metoprolol succinate (TOPROL-XL) 50 MG 24 hr tablet    Sig: Take 1 tablet (50 mg total) by mouth daily.    Dispense:  90 tablet    Refill:  1  . Vitamin D, Ergocalciferol, (DRISDOL) 50000 units CAPS capsule  Sig: Take 1 capsule (50,000 Units total) by mouth every 7 (seven) days.    Dispense:  12 capsule    Refill:  1  . albuterol (PROVENTIL HFA;VENTOLIN HFA) 108 (90 Base) MCG/ACT inhaler    Sig: Inhale 2 puffs into the lungs every 6 (six) hours as needed for wheezing or shortness of breath.    Dispense:  1 Inhaler    Refill:  0  . beclomethasone (QVAR) 80 MCG/ACT inhaler    Sig: Inhale 1 puff into the lungs 2 (two) times daily as needed.    Dispense:  1 Inhaler    Refill:  12  . citalopram (CELEXA) 20 MG tablet    Sig: Take 1 tablet (20 mg total) by mouth daily.    Dispense:  90 tablet    Refill:  1    CMA served as Education administrator during this visit. History, Physical and Plan performed by medical provider. Documentation and orders reviewed and attested to.  Penni Homans, MD

## 2016-11-04 NOTE — Patient Instructions (Signed)

## 2016-11-04 NOTE — Assessment & Plan Note (Addendum)
Check a urinalysis and culture which shows an [facility] UTI treated with antibiotics, probiotics, cranberry tabs

## 2016-11-04 NOTE — Assessment & Plan Note (Signed)
Check level today 

## 2016-11-04 NOTE — Assessment & Plan Note (Signed)
which is thinner than normal but not as bad as osteoporosis. Recommend calcium intake of 1200 to 1500 mg daily, divided into roughly 3 doses. Best source is the diet and a single dairy serving is about 500 mg, a supplement of calcium citrate once or twice daily to balance diet is fine if not getting enough in diet. Also need Vitamin D 2000 IU caps, 1 cap daily if not already taking vitamin D. Also recommend weight baring exercise on hips and upper body to keep bones strong

## 2016-11-04 NOTE — Assessment & Plan Note (Signed)
She notes several months of her left breast growing and becoming increasingly pain especially at 4 oclock. Repeat MGM today and consider a breast surgery consult if pain persists.

## 2016-11-04 NOTE — Assessment & Plan Note (Signed)
On Levothyroxine, continue to monitor 

## 2016-11-04 NOTE — Assessment & Plan Note (Signed)
Well controlled, no changes to meds. Encouraged heart healthy diet such as the DASH diet and exercise as tolerated.  °

## 2016-11-05 ENCOUNTER — Other Ambulatory Visit: Payer: Self-pay | Admitting: Family Medicine

## 2016-11-05 MED ORDER — NITROFURANTOIN MONOHYD MACRO 100 MG PO CAPS: 100.0000 mg | ORAL_CAPSULE | Freq: Two times a day (BID) | ORAL | 0 refills | Status: DC

## 2016-11-06 ENCOUNTER — Other Ambulatory Visit: Payer: Self-pay | Admitting: Family Medicine

## 2016-11-06 LAB — URINE CULTURE

## 2016-11-07 DIAGNOSIS — R109 Unspecified abdominal pain: Secondary | ICD-10-CM

## 2016-11-07 DIAGNOSIS — G479 Sleep disorder, unspecified: Secondary | ICD-10-CM

## 2016-11-07 DIAGNOSIS — R739 Hyperglycemia, unspecified: Secondary | ICD-10-CM | POA: Insufficient documentation

## 2016-11-07 HISTORY — DX: Sleep disorder, unspecified: G47.9

## 2016-11-07 HISTORY — DX: Unspecified abdominal pain: R10.9

## 2016-11-07 HISTORY — DX: Hyperglycemia, unspecified: R73.9

## 2016-11-07 NOTE — Assessment & Plan Note (Signed)
H Pylori is negative, report worsening symptoms.

## 2016-11-07 NOTE — Assessment & Plan Note (Signed)
Struggling with increased symptoms and dyspnea, will refer her to pulmonology for further consideration.

## 2016-11-07 NOTE — Assessment & Plan Note (Signed)
minimize simple carbs. Increase exercise as tolerated.  

## 2016-11-07 NOTE — Assessment & Plan Note (Signed)
With am Headaches, fatigue and HTN referred to pulmonology for possible sleep study.

## 2016-11-11 ENCOUNTER — Ambulatory Visit (HOSPITAL_BASED_OUTPATIENT_CLINIC_OR_DEPARTMENT_OTHER)
Admission: RE | Admit: 2016-11-11 | Discharge: 2016-11-11 | Disposition: A | Discharge status: Home / Self Care | Payer: Medicare HMO | Source: Ambulatory Visit | Attending: Family Medicine | Admitting: Family Medicine

## 2016-11-11 ENCOUNTER — Telehealth: Payer: Self-pay | Admitting: *Deleted

## 2016-11-11 DIAGNOSIS — R200 Anesthesia of skin: Secondary | ICD-10-CM | POA: Diagnosis not present

## 2016-11-11 DIAGNOSIS — I119 Hypertensive heart disease without heart failure: Secondary | ICD-10-CM | POA: Diagnosis not present

## 2016-11-11 DIAGNOSIS — R0600 Dyspnea, unspecified: Secondary | ICD-10-CM

## 2016-11-11 DIAGNOSIS — I272 Pulmonary hypertension, unspecified: Secondary | ICD-10-CM | POA: Diagnosis not present

## 2016-11-11 DIAGNOSIS — R079 Chest pain, unspecified: Secondary | ICD-10-CM | POA: Diagnosis not present

## 2016-11-11 DIAGNOSIS — I340 Nonrheumatic mitral (valve) insufficiency: Secondary | ICD-10-CM | POA: Diagnosis not present

## 2016-11-11 NOTE — Telephone Encounter (Signed)
Discussed w/ Dr. Charlett Blake. Will await echo results, then decide on cardiology referral vs. EKG in our office.  If any recurrence of symptoms, patient should seek immediate emergency care.  Pt notified of instructions and verbalized understanding.

## 2016-11-11 NOTE — Telephone Encounter (Signed)
PCP received message from echo tech that pt c/o CP while exercising when she was having her echo today.  Called patient and left message to return call for triage. Can transfer call to any nurse or [facility] if I am unavailable when patient returns call.

## 2016-11-11 NOTE — Progress Notes (Signed)
  Echocardiogram 2D Echocardiogram has been performed.  Donata Clay 11/11/2016, 1:32 PM

## 2016-11-11 NOTE — Telephone Encounter (Signed)
Spoke to the patient and she did state that while exercising on Tuesday her left arm began to hurt and shortly after her chest began to feel heavy.  She did stop exercising and pain did go away and since Tuesday has had no other pain. Was told while having echo this am by tech to let her PCP know of this.

## 2016-11-12 ENCOUNTER — Other Ambulatory Visit: Payer: Self-pay

## 2016-11-12 ENCOUNTER — Ambulatory Visit: Payer: Medicare HMO | Admitting: Family Medicine

## 2016-11-12 DIAGNOSIS — I517 Cardiomegaly: Secondary | ICD-10-CM

## 2016-11-12 DIAGNOSIS — R079 Chest pain, unspecified: Secondary | ICD-10-CM

## 2016-11-12 NOTE — Progress Notes (Signed)
Referral to Cardiology sent

## 2016-11-26 ENCOUNTER — Telehealth: Payer: Self-pay

## 2016-11-26 NOTE — Telephone Encounter (Signed)
Patient is on the list for [employer] 2018 and may be a good candidate for an AWV. Please let me know if/when appt is scheduled.   

## 2016-12-02 ENCOUNTER — Ambulatory Visit (INDEPENDENT_AMBULATORY_CARE_PROVIDER_SITE_OTHER): Payer: Medicare HMO | Admitting: Internal Medicine

## 2016-12-02 ENCOUNTER — Encounter: Payer: Self-pay | Admitting: Internal Medicine

## 2016-12-02 VITALS — BP 120/70 | HR 55 | Ht 59.0 in | Wt 126.0 lb

## 2016-12-02 DIAGNOSIS — I10 Essential (primary) hypertension: Secondary | ICD-10-CM | POA: Diagnosis not present

## 2016-12-02 DIAGNOSIS — J45991 Cough variant asthma: Secondary | ICD-10-CM

## 2016-12-02 MED ORDER — ALBUTEROL SULFATE HFA 108 (90 BASE) MCG/ACT IN AERS: INHALATION_SPRAY | RESPIRATORY_TRACT | 1 refills | Status: DC

## 2016-12-02 MED ORDER — BECLOMETHASONE DIPROP HFA 80 MCG/ACT IN AERB: 2.0000 | INHALATION_SPRAY | Freq: Two times a day (BID) | RESPIRATORY_TRACT | 0 refills | Status: DC

## 2016-12-02 MED ORDER — VALSARTAN 160 MG PO TABS: 160.0000 mg | ORAL_TABLET | Freq: Every day | ORAL | 11 refills | Status: DC

## 2016-12-02 NOTE — Patient Instructions (Addendum)
Stop prinivil and start valsartan 160 mg one daily in its place and the cough should gradually resolve    Plan A = Automatic = Qvar 80 Take 2 puffs first thing in am and then another 2 puffs about 12 hours later.  Rinse and gargle after using inhaler  Plan B = Backup Only use your albuterol as a rescue medication to be used if you can't catch your breath by resting or doing a relaxed purse lip breathing pattern.  - The less you use it, the better it will work when you need it. - Ok to use the inhaler up to 2 puffs  every 4 hours if you must but call for appointment if use goes up over your usual need - Don't leave home without it !!  (think of it like the spare tire for your car)   Plan C = Crisis - only use your albuterol nebulizer if you first try Plan B and it fails to help > ok to use the nebulizer up to every 4 hours but if start needing it regularly call for immediate appointment   Please schedule a follow up office visit in 4 weeks, sooner if needed  with all medications /inhalers/ solutions in hand so we can verify exactly what you are taking. This includes all medications from all doctors and over the counters - will need cxr on return  - add pepcid 20 mg hs

## 2016-12-02 NOTE — Progress Notes (Signed)
Subjective:     Patient ID: Julie Wilkerson, female   DOB: 1940/12/05,    MRN: [id number]  HPI   76 yo [place] female daughter of pediatrician  remembers freq episodes "bronchitis"  as child seemed better over time / moved to [name] in 1972 and did fine in [place] then [place] where developed a cough around 2011 dx as asthma he treated with as needed and moved here much better since arrived around 2015 but then  had bad flare in [place] ? On lisiniopril (was on it 01/06/15 per care everywhere notes)  > pulmononogist  Improved on symbicort but made her shaky so stopped it 2017 and overall worse so referred to pulmonary clinic 12/02/2016 by Dr   Charlett Blake     12/02/2016 1st [facility] office visit/ Julie Wilkerson   Chief Complaint  Patient presents with  . Pulmonary Consult    Referred by Dr. Vivien Rossetti. Pt c/o cough and SOB for the past 7 yrs. Her cough is prod with clear sputum. Cough tends to be worse at night and wakes her up often.  She states she has had pain in her chest that radiated to her left back for the past 2 months.  She states she gets SOB when she walks to fast.  She uses albuterol 1-2 x per day on average.    much better while on symbicort still needed saba and now that stopped  Due to shakiness And using saba dpi 3-4 per 24 h  Main concern is noct coughing/ doe = MMRC1 = can walk nl pace, flat grade, can't hurry or go uphills or steps s sob    "Cp"  is midline/post/ positional x > 6 months, worse with coughing   No obvious day to day or daytime variability or assoc excess/ purulent sputum or mucus plugs or hemoptysis or c chest tightness, subjective wheeze or overt sinus or hb symptoms. No unusual exp hx or h/o childhood pna/ asthma or knowledge of premature birth.  Sleeping ok without nocturnal  or early am exacerbation  of respiratory  c/o's or need for noct saba. Also denies any obvious fluctuation of symptoms with weather or environmental changes or other aggravating or alleviating factors except as  outlined above   Current Medications, Allergies, Complete Past Medical History, Past Surgical History, Family History, and Social History were reviewed in [employer] record.  ROS  The following are not active complaints unless bolded sore throat, dysphagia, dental problems, itching, sneezing,  nasal congestion or excess/ purulent secretions, ear ache,   fever, chills, sweats, unintended wt loss, classically pleuritic or exertional cp,  orthopnea pnd or leg swelling, presyncope, palpitations, abdominal pain, anorexia, nausea, vomiting, diarrhea  or change in bowel or bladder habits, change in stools or urine, dysuria,hematuria,  rash, arthralgias, visual complaints, headache, numbness, weakness or ataxia or problems with walking or coordination,  change in mood/affect or memory.                   Review of Systems     Objective:   Physical Exam    amb pleasant latina  nad somewhat hoarse, occ throat clearing  Wt Readings from Last 3 Encounters:  12/02/16 126 lb (57.2 kg)  11/04/16 125 lb 3.2 oz (56.8 kg)  06/08/16 124 lb 9.6 oz (56.5 kg)    Vital signs reviewed  - Note on arrival 02 sats  100% on RA     HEENT: nl dentition, turbinates bilaterally, and oropharynx. Nl  external ear canals without cough reflex   NECK :  without JVD/Nodes/TM/ nl carotid upstrokes bilaterally   LUNGS: no acc muscle use,  Nl contour chest which is clear to A and P bilaterally without cough on insp or exp maneuvers   CV:  RRR  no s3 or murmur or increase in P2, and no edema   ABD:  soft and nontender with nl inspiratory excursion in the supine position. No bruits or organomegaly appreciated, bowel sounds nl  MS:  Nl gait/ ext warm without deformities, calf tenderness, cyanosis or clubbing No obvious joint restrictions   SKIN: warm and dry without lesions    NEURO:  alert, approp, nl sensorium with  no motor or cerebellar deficits apparent.    No cxr's on file       Assessment:

## 2016-12-03 ENCOUNTER — Institutional Professional Consult (permissible substitution): Payer: Medicare HMO | Admitting: Internal Medicine

## 2016-12-03 ENCOUNTER — Telehealth: Payer: Self-pay | Admitting: *Deleted

## 2016-12-03 DIAGNOSIS — J45991 Cough variant asthma: Secondary | ICD-10-CM

## 2016-12-03 NOTE — Telephone Encounter (Signed)
Spoke with the pt and notified of recs per MW  She verbalized understanding  CXR ordered

## 2016-12-03 NOTE — Assessment & Plan Note (Addendum)
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   These are the same pts (now being labeled as having "irritable larynx syndrome" by some cough centers) who not infrequently have a history of having failed to tolerate ace inhibitors,  dry powder inhalers(like respiclick)  or biphosphonates or report having atypical/extraesophageal reflux symptoms that don't respond to standard doses of PPI  and are easily confused as having aecopd or asthma flares by even experienced allergists/ pulmonologists (myself included).    Will give her a sample of qvar 80 2bid (least likely asthma inhaler to aggravate UACS) and trial off acei x 4 weeks then return and use pepcid 20 mg at bedtime in meantime to see if helps noct symptoms   Total time devoted to counseling  > 50 % of initial 60 min office visit:  review case with pt/ discussion of options/alternatives/ personally creating written customized instructions  in presence of pt  then going over those specific  Instructions directly with the pt including how to use all of the meds but in particular covering each new  medication in detail and the difference between the maintenance= "automatic" meds and the prns using an action plan format for the latter (If this problem/symptom => do that organization reading Left to right).  Please see AVS from this visit for a full list of these instructions which I personally wrote for this pt and  are unique to this visit.

## 2016-12-03 NOTE — Telephone Encounter (Signed)
-----   Message from Tanda Rockers, MD sent at 12/03/2016  6:07 AM EDT ----- Advise her to add pepcid 20 mg at bedtime until returns (otc is fine) and needs cxr on return unless she's had one recently as I could not find in system

## 2016-12-03 NOTE — Assessment & Plan Note (Signed)
In the best review of chronic cough to date ( NEJM 2016 375 [phone]) ,  ACEi are now felt to cause cough in up to  20% of pts which is a 4 fold increase from previous reports and does not include the variety of non-specific complaints we see in pulmonary clinic in pts on ACEi but previously attributed to another dx like  Copd/asthma and  include PNDS, throat and chest congestion, "bronchitis", unexplained dyspnea and noct "strangling" sensations, and hoarseness, but also  atypical /refractory GERD symptoms like dysphagia and "bad heartburn"   The only way I know  to prove this is not an "ACEi Case" is a trial off ACEi x a minimum of 6 weeks then regroup.   Try diovan 160 mg daily and recheck in 4 weeks

## 2016-12-06 ENCOUNTER — Telehealth: Payer: Self-pay | Admitting: Family Medicine

## 2016-12-06 ENCOUNTER — Ambulatory Visit (INDEPENDENT_AMBULATORY_CARE_PROVIDER_SITE_OTHER)
Admission: RE | Admit: 2016-12-06 | Discharge: 2016-12-06 | Disposition: A | Discharge status: Home / Self Care | Payer: Medicare HMO | Source: Ambulatory Visit | Attending: Internal Medicine | Admitting: Internal Medicine

## 2016-12-06 DIAGNOSIS — R05 Cough: Secondary | ICD-10-CM | POA: Diagnosis not present

## 2016-12-06 DIAGNOSIS — R0602 Shortness of breath: Secondary | ICD-10-CM | POA: Diagnosis not present

## 2016-12-06 DIAGNOSIS — J45991 Cough variant asthma: Secondary | ICD-10-CM | POA: Diagnosis not present

## 2016-12-06 NOTE — Telephone Encounter (Signed)
[facility] - Client TELEPHONE ADVICE RECORD [facility]  Patient Name: Julie Wilkerson  DOB: 1941/01/24    Initial Comment Caller states her BP 112/43. Headache last night.   Nurse Assessment  Nurse: Harlow Mares, RN, Suanne Marker Date/Time (Eastern Time): 12/06/2016 11:17:18 AM  Confirm and document reason for call. If symptomatic, describe symptoms. ---Caller states her BP 112/43. Headache last night. 145/70 and headache didn't go away. She has been placing ice on her head and she is taking her lisinopril . 119/53 this morning. headache is better. She was recently changed for Vasotec from [place]. She has been having problems with low blood pressure and she is feeling dizziness and is very tired.  Does the patient have any new or worsening symptoms? ---Yes  Will a triage be completed? ---Yes  Related visit to physician within the last 2 weeks? ---Yes  Does the PT have any chronic conditions? (i.e. diabetes, asthma, etc.) ---Yes  List chronic conditions. ---HTN; asthma  Is this a behavioral health or substance abuse call? ---No     Guidelines    Guideline Title Affirmed Question Affirmed Notes  Dizziness - Lightheadedness Taking a medicine that could cause dizziness (e.g., blood pressure medications, diuretics)    Final Disposition User   See Physician within [facility], RN, Suanne Marker    Comments  Wrong number listed, checked with PC for corrected phone number.  [phone] per recording  Caller reports that she was scheduled for appt prior to triage, and has appt tomorrow with Dr. Randel Pigg at the [facility] office at 7:15am. Appt confirmed.   Referrals  REFERRED TO PCP OFFICE   Disagree/Comply: Comply

## 2016-12-07 ENCOUNTER — Encounter: Payer: Self-pay | Admitting: Family Medicine

## 2016-12-07 ENCOUNTER — Ambulatory Visit (INDEPENDENT_AMBULATORY_CARE_PROVIDER_SITE_OTHER): Payer: Medicare HMO | Admitting: Family Medicine

## 2016-12-07 VITALS — BP 122/60 | HR 52 | Temp 97.9°F | Resp 18 | Wt 124.6 lb

## 2016-12-07 DIAGNOSIS — I10 Essential (primary) hypertension: Secondary | ICD-10-CM | POA: Diagnosis not present

## 2016-12-07 DIAGNOSIS — E039 Hypothyroidism, unspecified: Secondary | ICD-10-CM | POA: Diagnosis not present

## 2016-12-07 DIAGNOSIS — N390 Urinary tract infection, site not specified: Secondary | ICD-10-CM

## 2016-12-07 DIAGNOSIS — R739 Hyperglycemia, unspecified: Secondary | ICD-10-CM | POA: Diagnosis not present

## 2016-12-07 DIAGNOSIS — J45991 Cough variant asthma: Secondary | ICD-10-CM

## 2016-12-07 LAB — CBC WITH DIFFERENTIAL/PLATELET
Basophils Absolute: 0 10*3/uL (ref 0.0–0.1)
Basophils Relative: 0.3 % (ref 0.0–3.0)
EOS ABS: 0 10*3/uL (ref 0.0–0.7)
Eosinophils Relative: 0 % (ref 0.0–5.0)
HCT: 34.2 % — ABNORMAL LOW (ref 36.0–46.0)
Hemoglobin: 11.7 g/dL — ABNORMAL LOW (ref 12.0–15.0)
LYMPHS ABS: 2.1 10*3/uL (ref 0.7–4.0)
Lymphocytes Relative: 36.8 % (ref 12.0–46.0)
MCHC: 34.2 g/dL (ref 30.0–36.0)
MCV: 89.4 fl (ref 78.0–100.0)
MONO ABS: 0.6 10*3/uL (ref 0.1–1.0)
Monocytes Relative: 11.2 % (ref 3.0–12.0)
NEUTROS PCT: 51.7 % (ref 43.0–77.0)
Neutro Abs: 2.9 10*3/uL (ref 1.4–7.7)
Platelets: 232 10*3/uL (ref 150.0–400.0)
RBC: 3.83 Mil/uL — AB (ref 3.87–5.11)
RDW: 14.3 % (ref 11.5–15.5)
WBC: 5.6 10*3/uL (ref 4.0–10.5)

## 2016-12-07 LAB — URINALYSIS
BILIRUBIN URINE: NEGATIVE
HGB URINE DIPSTICK: NEGATIVE
Ketones, ur: NEGATIVE
Leukocytes, UA: NEGATIVE
NITRITE: NEGATIVE
Specific Gravity, Urine: <=1.005 — AB (ref 1.000–1.030)
TOTAL PROTEIN, URINE-UPE24: NEGATIVE
URINE GLUCOSE: NEGATIVE
UROBILINOGEN UA: 0.2 (ref 0.0–1.0)
pH: 6 (ref 5.0–8.0)

## 2016-12-07 LAB — COMPREHENSIVE METABOLIC PANEL
ALT: 14 U/L (ref 0–35)
AST: 21 U/L (ref 0–37)
Albumin: 4.2 g/dL (ref 3.5–5.2)
Alkaline Phosphatase: 49 U/L (ref 39–117)
BUN: 31 mg/dL — ABNORMAL HIGH (ref 6–23)
CO2: 26 meq/L (ref 19–32)
Calcium: 9.2 mg/dL (ref 8.4–10.5)
Chloride: 103 mEq/L (ref 96–112)
Creatinine, Ser: 1.05 mg/dL (ref 0.40–1.20)
GFR: 54.3 mL/min — AB (ref >60.00)
GLUCOSE: 109 mg/dL — AB (ref 70–99)
POTASSIUM: 4.3 meq/L (ref 3.5–5.1)
Sodium: 135 mEq/L (ref 135–145)
Total Bilirubin: 0.6 mg/dL (ref 0.2–1.2)
Total Protein: 7.1 g/dL (ref 6.0–8.3)

## 2016-12-07 MED ORDER — LOSARTAN POTASSIUM 50 MG PO TABS: 50.0000 mg | ORAL_TABLET | Freq: Every day | ORAL | 1 refills | Status: DC

## 2016-12-07 NOTE — Assessment & Plan Note (Signed)
Suspect return of UTI with some urinary urgency. Check a UA with culture

## 2016-12-07 NOTE — Patient Instructions (Signed)

## 2016-12-07 NOTE — Assessment & Plan Note (Signed)
Has tolerated the addition of Qvar 89, 2 puffs twice daily and Valsartan with the Valsartan recall will change to Losartan.

## 2016-12-07 NOTE — Assessment & Plan Note (Signed)
Ok in office but has been labile at home with headaches when bp gets above 160. Will change Valsartan to Losartan 50 mg daily due to recall to take qhs and continue Metoprolol in morning.

## 2016-12-07 NOTE — Assessment & Plan Note (Signed)
On Levothyroxine, continue to monitor 

## 2016-12-07 NOTE — Assessment & Plan Note (Signed)
minimize simple carbs. Increase exercise as tolerated.  

## 2016-12-07 NOTE — Progress Notes (Signed)
Extremely tired Subjective:  I acted as a Education administrator for Dr. Charlett Blake. [name], [place]  Patient ID: Julie Wilkerson, female    DOB: [birth year], 76 y.o.   MRN: [id number]  No chief complaint on file.   HPI  Patient is in today for an cute visit for her blood presure. Patient states over the past 2 weeks she has been feeling extremely fatigue, ans sleepy with headaches that come and go. She states her bp readings at home have been on the low end or the high end. No recent febrile illness or acute hospitalizations. Denies CP/palp/SOB/HA/congestion/fevers/GI c/o. Taking meds as prescribed  Endorses some urinary urgency and left flank pain. No obvious fevers, chills or anorexia.  Patient Care Team: [name], MD as PCP - General (Family Medicine) Lake Bells., MD as Consulting Physician (Gastroenterology) Belva Bertin, Armida Sans as Physician Assistant (Family Medicine)   Past Medical History:  Diagnosis Date  . Asthma   . Breast cancer ([place])    2002, left, encapsulated, microcalcifications. lumpectomy, radtiation x 30  . Breast cancer in female [facility])   . Change in mole 06/08/2016  . Colitis   . Dyspnea 11/04/2016  . Dysuria 11/04/2016  . Gluten intolerance   . History of chicken pox   . History of Helicobacter pylori infection 08/01/2015  . Hyperlipidemia   . Hypertension   . Hypothyroid   . IBS (irritable bowel syndrome)   . Low back pain 08/10/2015  . Osteoporosis   . Stomach cramps   . Urinary tract infection 11/04/2016    Past Surgical History:  Procedure Laterality Date  . ABDOMINAL HYSTERECTOMY     partial  . APPENDECTOMY    . BREAST LUMPECTOMY Left 2002  . CHOLECYSTECTOMY  Age 55 or 89  . COLONOSCOPY  2017  . FRACTURE SURGERY    . TONSILLECTOMY      Family History  Problem Relation Age of Onset  . Colitis Mother   . Irritable bowel syndrome Mother   . Hypertension Father   . Hyperlipidemia Brother   . Heart attack Brother   . Stomach cancer Paternal Grandmother   .  Cancer Paternal Grandmother 30       stomach  . Hyperlipidemia Brother   . Heart attack Brother   . Hyperlipidemia Brother   . Heart attack Brother   . Hyperlipidemia Sister   . Arthritis Daughter   . Heart disease Daughter        ASD vs VSD    Social History   Social History  . Marital status: Divorced    Spouse name: N/A  . Number of children: N/A  . Years of education: N/A   Occupational History  . retired     Pharmacist, hospital, kindergarten   Social History Main Topics  . Smoking status: Never Smoker  . Smokeless tobacco: Never Used  . Alcohol use No  . Drug use: No  . Sexual activity: No     Comment: lives alone, avoids daiiry and gluten. 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 Not on file   Social History Narrative  . No narrative on file    Outpatient Medications Prior to Visit  Medication Sig Dispense Refill  . albuterol (PROAIR HFA) 108 (90 Base) MCG/ACT inhaler 2 pffs every 4 hours as needed 1 Inhaler 1  . aspirin 81 MG tablet Take 81 mg by mouth daily.    . beclomethasone (QVAR REDIHALER) 80 MCG/ACT inhaler Inhale 2 puffs into the lungs  2 (two) times daily. 1 Inhaler 0  . citalopram (CELEXA) 20 MG tablet Take 1 tablet (20 mg total) by mouth daily. 90 tablet 1  . Collagen-Vitamin C (COLLAGEN PLUS VITAMIN C PO) Take 1 tablet by mouth daily.    . hyoscyamine (LEVSIN SL) 0.125 MG SL tablet Place 1 tablet (0.125 mg total) under the tongue every 4 (four) hours as needed. 40 tablet 2  . levothyroxine (SYNTHROID, LEVOTHROID) 50 MCG tablet TAKE ONE TABLET BY MOUTH ONCE DAILY 90 tablet 1  . metoprolol succinate (TOPROL-XL) 50 MG 24 hr tablet Take 1 tablet (50 mg total) by mouth daily. 90 tablet 1  . Multiple Vitamins-Minerals (MULTIVITAMIN ADULT PO) Take 1 tablet by mouth daily.    . Vitamin D, Ergocalciferol, (DRISDOL) 50000 units CAPS capsule Take 1 capsule (50,000 Units total) by mouth every 7 (seven) days. 12 capsule 1  . valsartan (DIOVAN) 160 MG tablet Take  1 tablet (160 mg total) by mouth daily. 30 tablet 11   No facility-administered medications prior to visit.     No Known Allergies  Review of Systems  Constitutional: Positive for malaise/fatigue. Negative for fever.  HENT: Negative for congestion.   Eyes: Negative for blurred vision.  Respiratory: Negative for cough and shortness of breath.   Cardiovascular: Negative for chest pain, palpitations and leg swelling.  Gastrointestinal: Negative for vomiting.  Genitourinary: Positive for flank pain and urgency. Negative for dysuria and frequency.  Musculoskeletal: Negative for back pain.  Skin: Negative for rash.  Neurological: Positive for headaches. Negative for loss of consciousness.       Objective:    Physical Exam  Constitutional: She is oriented to person, place, and time. She appears well-developed and well-nourished. No distress.  HENT:  Head: Normocephalic and atraumatic.  Eyes: Conjunctivae are normal.  Neck: Normal range of motion. No thyromegaly present.  Cardiovascular: Normal rate and regular rhythm.   Pulmonary/Chest: Effort normal and breath sounds normal. She has no wheezes.  Abdominal: Soft. Bowel sounds are normal. There is no tenderness.  Musculoskeletal: Normal range of motion. She exhibits no edema or deformity.  Neurological: She is alert and oriented to person, place, and time.  Skin: Skin is warm and dry. She is not diaphoretic.  Psychiatric: She has a normal mood and affect.    BP 122/60 (BP Location: Left Arm, Patient Position: Sitting, Cuff Size: Normal)   Pulse (!) 52   Temp 97.9 F (36.6 C) (Oral)   Resp 18   Wt 124 lb 9.6 oz (56.5 kg)   SpO2 96%   BMI 25.17 kg/m  Wt Readings from Last 3 Encounters:  12/07/16 124 lb 9.6 oz (56.5 kg)  12/02/16 126 lb (57.2 kg)  11/04/16 125 lb 3.2 oz (56.8 kg)   BP Readings from Last 3 Encounters:  12/07/16 122/60  12/02/16 120/70  11/04/16 (!) 110/58     Immunization History  Administered Date(s)  Administered  . Influenza, High Dose Seasonal PF 03/11/2015, 02/05/2016  . Pneumococcal Polysaccharide-23 05/23/2014  . Td 02/05/2016  . Zoster 05/24/2009    Health Maintenance  Topic Date Due  . PNA vac Low Risk Adult (2 of 2 - PCV13) 05/24/2015  . INFLUENZA VACCINE  12/22/2016  . COLONOSCOPY  08/04/2025  . TETANUS/TDAP  02/04/2026  . DEXA SCAN  Completed    Lab Results  Component Value Date   WBC 6.5 11/04/2016   HGB 11.6 (L) 11/04/2016   HCT 34.4 (L) 11/04/2016   PLT 225.0 11/04/2016  GLUCOSE 77 11/04/2016   CHOL 219 (H) 11/04/2016   TRIG 166.0 (H) 11/04/2016   HDL 63.40 11/04/2016   LDLCALC 122 (H) 11/04/2016   ALT 15 11/04/2016   AST 25 11/04/2016   NA 136 11/04/2016   K 5.0 11/04/2016   CL 102 11/04/2016   CREATININE 1.05 11/04/2016   BUN 30 (H) 11/04/2016   CO2 27 11/04/2016   TSH 2.04 11/04/2016   HGBA1C 6.1 11/04/2016    Lab Results  Component Value Date   TSH 2.04 11/04/2016   Lab Results  Component Value Date   WBC 6.5 11/04/2016   HGB 11.6 (L) 11/04/2016   HCT 34.4 (L) 11/04/2016   MCV 89.7 11/04/2016   PLT 225.0 11/04/2016   Lab Results  Component Value Date   NA 136 11/04/2016   K 5.0 11/04/2016   CO2 27 11/04/2016   GLUCOSE 77 11/04/2016   BUN 30 (H) 11/04/2016   CREATININE 1.05 11/04/2016   BILITOT 0.5 11/04/2016   ALKPHOS 54 11/04/2016   AST 25 11/04/2016   ALT 15 11/04/2016   PROT 7.2 11/04/2016   ALBUMIN 4.3 11/04/2016   CALCIUM 9.8 11/04/2016   GFR 54.31 (L) 11/04/2016   Lab Results  Component Value Date   CHOL 219 (H) 11/04/2016   Lab Results  Component Value Date   HDL 63.40 11/04/2016   Lab Results  Component Value Date   LDLCALC 122 (H) 11/04/2016   Lab Results  Component Value Date   TRIG 166.0 (H) 11/04/2016   Lab Results  Component Value Date   CHOLHDL 3 11/04/2016   Lab Results  Component Value Date   HGBA1C 6.1 11/04/2016         Assessment & Plan:   Problem List Items Addressed This Visit      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      Essential hypertension    Ok in office but has been labile at home with headaches when bp gets above 160. Will change Valsartan to Losartan 50 mg daily due to recall to take qhs and continue Metoprolol in morning.       Relevant Medications   losartan (COZAAR) 50 MG tablet   Other Relevant Orders   CBC w/Diff   Comprehensive metabolic panel   Hypothyroid    On Levothyroxine, continue to monitor      Urinary tract infection - Primary    Suspect return of UTI with some urinary urgency. Check a UA with culture       Relevant Orders   CBC w/Diff   Urinalysis   Urine Culture   Hyperglycemia     minimize simple carbs. Increase exercise as tolerated.          I have discontinued Ms. Joswick's valsartan. I am also having her start on losartan. Additionally, I am having her maintain her Multiple Vitamins-Minerals (MULTIVITAMIN ADULT PO), Collagen-Vitamin C (COLLAGEN PLUS VITAMIN C PO), aspirin, hyoscyamine, metoprolol succinate, Vitamin D (Ergocalciferol), citalopram, levothyroxine, beclomethasone, and albuterol.  Meds ordered this encounter  Medications  . losartan (COZAAR) 50 MG tablet    Sig: Take 1 tablet (50 mg total) by mouth at bedtime.    Dispense:  90 tablet    Refill:  1    CMA served as Education administrator during this visit. History, Physical and Plan performed by medical  provider. Documentation and orders reviewed and attested to.  Penni Homans, MD

## 2016-12-08 LAB — URINE CULTURE: ORGANISM ID, BACTERIA: NO GROWTH

## 2016-12-08 NOTE — Progress Notes (Signed)
Spoke with pt and notified of results per Dr. Wert. Pt verbalized understanding and denied any questions. 

## 2016-12-15 ENCOUNTER — Other Ambulatory Visit: Payer: Self-pay | Admitting: Family Medicine

## 2016-12-15 DIAGNOSIS — C50919 Malignant neoplasm of unspecified site of unspecified female breast: Secondary | ICD-10-CM

## 2016-12-15 DIAGNOSIS — N644 Mastodynia: Secondary | ICD-10-CM

## 2016-12-22 ENCOUNTER — Ambulatory Visit
Admission: RE | Admit: 2016-12-22 | Discharge: 2016-12-22 | Disposition: A | Discharge status: Home / Self Care | Payer: Medicare HMO | Source: Ambulatory Visit | Attending: Family Medicine | Admitting: Family Medicine

## 2016-12-22 DIAGNOSIS — N644 Mastodynia: Secondary | ICD-10-CM

## 2016-12-22 DIAGNOSIS — R922 Inconclusive mammogram: Secondary | ICD-10-CM | POA: Diagnosis not present

## 2016-12-22 DIAGNOSIS — C50919 Malignant neoplasm of unspecified site of unspecified female breast: Secondary | ICD-10-CM

## 2016-12-22 DIAGNOSIS — N6489 Other specified disorders of breast: Secondary | ICD-10-CM | POA: Diagnosis not present

## 2016-12-27 ENCOUNTER — Encounter: Payer: Self-pay | Admitting: *Deleted

## 2017-01-10 ENCOUNTER — Encounter: Payer: Self-pay | Admitting: Family Medicine

## 2017-01-10 ENCOUNTER — Ambulatory Visit (INDEPENDENT_AMBULATORY_CARE_PROVIDER_SITE_OTHER): Payer: Medicare HMO | Admitting: Family Medicine

## 2017-01-10 VITALS — BP 100/52 | HR 51 | Temp 98.0°F | Resp 18 | Wt 125.0 lb

## 2017-01-10 DIAGNOSIS — K900 Celiac disease: Secondary | ICD-10-CM

## 2017-01-10 DIAGNOSIS — H547 Unspecified visual loss: Secondary | ICD-10-CM | POA: Diagnosis not present

## 2017-01-10 DIAGNOSIS — N390 Urinary tract infection, site not specified: Secondary | ICD-10-CM | POA: Diagnosis not present

## 2017-01-10 DIAGNOSIS — I10 Essential (primary) hypertension: Secondary | ICD-10-CM

## 2017-01-10 DIAGNOSIS — A048 Other specified bacterial intestinal infections: Secondary | ICD-10-CM

## 2017-01-10 DIAGNOSIS — R69 Illness, unspecified: Secondary | ICD-10-CM | POA: Diagnosis not present

## 2017-01-10 DIAGNOSIS — R51 Headache: Secondary | ICD-10-CM | POA: Diagnosis not present

## 2017-01-10 DIAGNOSIS — R197 Diarrhea, unspecified: Secondary | ICD-10-CM | POA: Diagnosis not present

## 2017-01-10 DIAGNOSIS — M545 Low back pain: Secondary | ICD-10-CM

## 2017-01-10 DIAGNOSIS — F988 Other specified behavioral and emotional disorders with onset usually occurring in childhood and adolescence: Secondary | ICD-10-CM | POA: Diagnosis not present

## 2017-01-10 DIAGNOSIS — E039 Hypothyroidism, unspecified: Secondary | ICD-10-CM | POA: Diagnosis not present

## 2017-01-10 DIAGNOSIS — S338XXS Sprain of other parts of lumbar spine and pelvis, sequela: Secondary | ICD-10-CM | POA: Diagnosis not present

## 2017-01-10 DIAGNOSIS — R109 Unspecified abdominal pain: Secondary | ICD-10-CM

## 2017-01-10 DIAGNOSIS — R739 Hyperglycemia, unspecified: Secondary | ICD-10-CM

## 2017-01-10 DIAGNOSIS — E782 Mixed hyperlipidemia: Secondary | ICD-10-CM

## 2017-01-10 DIAGNOSIS — R519 Headache, unspecified: Secondary | ICD-10-CM

## 2017-01-10 LAB — URINALYSIS
BILIRUBIN URINE: NEGATIVE
HGB URINE DIPSTICK: NEGATIVE
Ketones, ur: NEGATIVE
Leukocytes, UA: NEGATIVE
NITRITE: NEGATIVE
Specific Gravity, Urine: 1.01 (ref 1.000–1.030)
TOTAL PROTEIN, URINE-UPE24: NEGATIVE
UROBILINOGEN UA: 0.2 (ref 0.0–1.0)
Urine Glucose: NEGATIVE
pH: 6 (ref 5.0–8.0)

## 2017-01-10 MED ORDER — HYOSCYAMINE SULFATE 0.125 MG SL SUBL: 0.1250 mg | SUBLINGUAL_TABLET | SUBLINGUAL | 2 refills | Status: DC | PRN

## 2017-01-10 MED ORDER — METOPROLOL SUCCINATE ER 25 MG PO TB24: 25.0000 mg | ORAL_TABLET | Freq: Every day | ORAL | 3 refills | Status: DC

## 2017-01-10 MED ORDER — CITALOPRAM HYDROBROMIDE 20 MG PO TABS: 20.0000 mg | ORAL_TABLET | Freq: Every day | ORAL | 1 refills | Status: DC

## 2017-01-10 NOTE — Assessment & Plan Note (Addendum)
Will start with an ultrasound, if no obvious lesion but pain persists. So will proceed with ultrasound and CT scan if persists. Check UA with culture.

## 2017-01-10 NOTE — Patient Instructions (Addendum)
Lidocaine gel to low back for pain  Back Pain, Adult Back pain is very common in adults.The cause of back pain is rarely dangerous and the pain often gets better over time.The cause of your back pain may not be known. Some common causes of back pain include:  Strain of the muscles or ligaments supporting the spine.  Wear and tear (degeneration) of the spinal disks.  Arthritis.  Direct injury to the back.  For many people, back pain may return. Since back pain is rarely dangerous, most people can learn to manage this condition on their own. Follow these instructions at home: Watch your back pain for any changes. The following actions may help to lessen any discomfort you are feeling:  Remain active. It is stressful on your back to sit or stand in one place for long periods of time. Do not sit, drive, or stand in one place for more than 30 minutes at a time. Take short walks on even surfaces as soon as you are able.Try to increase the length of time you walk each day.  Exercise regularly as directed by your health care provider. Exercise helps your back heal faster. It also helps avoid future injury by keeping your muscles strong and flexible.  Do not stay in bed.Resting more than 1-2 days can delay your recovery.  Pay attention to your body when you bend and lift. The most comfortable positions are those that put less stress on your recovering back. Always use proper lifting techniques, including: ? Bending your knees. ? Keeping the load close to your body. ? Avoiding twisting.  Find a comfortable position to sleep. Use a firm mattress and lie on your side with your knees slightly bent. If you lie on your back, put a pillow under your knees.  Avoid feeling anxious or stressed.Stress increases muscle tension and can worsen back pain.It is important to recognize when you are anxious or stressed and learn ways to manage it, such as with exercise.  Take medicines only as directed by  your health care provider. Over-the-counter medicines to reduce pain and inflammation are often the most helpful.Your health care provider may prescribe muscle relaxant drugs.These medicines help dull your pain so you can more quickly return to your normal activities and healthy exercise.  Apply ice to the injured area: ? Put ice in a plastic bag. ? Place a towel between your skin and the bag. ? Leave the ice on for 20 minutes, 2-3 times a day for the first 2-3 days. After that, ice and heat may be alternated to reduce pain and spasms.  Maintain a healthy weight. Excess weight puts extra stress on your back and makes it difficult to maintain good posture.  Contact a health care provider if:  You have pain that is not relieved with rest or medicine.  You have increasing pain going down into the legs or buttocks.  You have pain that does not improve in one week.  You have night pain.  You lose weight.  You have a fever or chills. Get help right away if:  You develop new bowel or bladder control problems.  You have unusual weakness or numbness in your arms or legs.  You develop nausea or vomiting.  You develop abdominal pain.  You feel faint. This information is not intended to replace advice given to you by your health care provider. Make sure you discuss any questions you have with your health care provider. Document Released: 05/10/2005 Document Revised: 09/18/2015  Document Reviewed: 09/11/2013 Elsevier Interactive Patient Education  2017 [employer].

## 2017-01-10 NOTE — Progress Notes (Signed)
Subjective:  I acted as a Education administrator for Dr. Charlett Blake. [name], [place]  Patient ID: Julie Wilkerson, female    DOB: Mar 17, 1941, 76 y.o.   MRN: [id number]  No chief complaint on file.   HPI  Patient is in today for a 4 week follow up. Patient is following up on her asthma. She states she is doing well. She notes her cough and breathing are much better. No fevers or chills. She is requesting a referral to opthamology for evaluation. She is noting a sense of fatigue persists. She notes her family she is often inattentive and they are questioning if she has ADD. She is also struggling with low back pain without incontinence or radicular symptoms. She is finally complaining of some intermittent RLQ pain. Denies CP/palp/SOB/HA/congestion/fevers/GI or GU c/o. Taking meds as prescribed  Patient Care Team: [name], MD as PCP - General (Family Medicine) Lake Bells., MD as Consulting Physician (Gastroenterology) Belva Bertin, Armida Sans as Physician Assistant (Family Medicine)   Past Medical History:  Diagnosis Date  . Asthma   . Breast cancer ([place])    2002, left, encapsulated, microcalcifications. lumpectomy, radtiation x 30  . Breast cancer in female [facility])   . Change in mole 06/08/2016  . Colitis   . Decreased visual acuity 01/16/2017  . Dyspnea 11/04/2016  . Dysuria 11/04/2016  . Gluten intolerance   . History of chicken pox   . History of Helicobacter pylori infection 08/01/2015  . Hyperlipidemia   . Hypertension   . Hypothyroid   . IBS (irritable bowel syndrome)   . Low back pain 08/10/2015  . Osteoporosis   . Stomach cramps   . Urinary tract infection 11/04/2016    Past Surgical History:  Procedure Laterality Date  . ABDOMINAL HYSTERECTOMY     partial  . APPENDECTOMY    . BREAST LUMPECTOMY Left 2002  . CHOLECYSTECTOMY  Age 66 or 4  . COLONOSCOPY  2017  . FRACTURE SURGERY    . TONSILLECTOMY      Family History  Problem Relation Age of Onset  . Colitis Mother   . Irritable  bowel syndrome Mother   . Hypertension Father   . Hyperlipidemia Brother   . Heart attack Brother   . Stomach cancer Paternal Grandmother 39  . Hyperlipidemia Brother   . Heart attack Brother   . Hyperlipidemia Brother   . Heart attack Brother   . Hyperlipidemia Sister   . Leukemia Daughter   . Arthritis Daughter   . Heart disease Daughter        ASD vs VSD    Social History   Social History  . Marital status: Divorced    Spouse name: N/A  . Number of children: N/A  . Years of education: N/A   Occupational History  . retired     Pharmacist, hospital, kindergarten   Social History Main Topics  . Smoking status: Never Smoker  . Smokeless tobacco: Never Used  . Alcohol use No  . Drug use: No  . Sexual activity: No     Comment: lives alone, avoids daiiry and gluten. 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 Not on file   Social History Narrative  . No narrative on file    Outpatient Medications Prior to Visit  Medication Sig Dispense Refill  . aspirin 81 MG tablet Take 81 mg by mouth daily.    . beclomethasone (QVAR REDIHALER) 80 MCG/ACT inhaler Inhale 2 puffs into the lungs 2 (two)  times daily. 1 Inhaler 0  . Collagen-Vitamin C (COLLAGEN PLUS VITAMIN C PO) Take 1 tablet by mouth daily.    Marland Kitchen levothyroxine (SYNTHROID, LEVOTHROID) 50 MCG tablet TAKE ONE TABLET BY MOUTH ONCE DAILY 90 tablet 1  . losartan (COZAAR) 50 MG tablet Take 1 tablet (50 mg total) by mouth at bedtime. 90 tablet 1  . Multiple Vitamins-Minerals (MULTIVITAMIN ADULT PO) Take 1 tablet by mouth daily.    . Vitamin D, Ergocalciferol, (DRISDOL) 50000 units CAPS capsule Take 1 capsule (50,000 Units total) by mouth every 7 (seven) days. 12 capsule 1  . albuterol (PROAIR HFA) 108 (90 Base) MCG/ACT inhaler 2 pffs every 4 hours as needed (Patient not taking: Reported on 01/13/2017) 1 Inhaler 1  . citalopram (CELEXA) 20 MG tablet Take 1 tablet (20 mg total) by mouth daily. 90 tablet 1  . hyoscyamine (LEVSIN SL)  0.125 MG SL tablet Place 1 tablet (0.125 mg total) under the tongue every 4 (four) hours as needed. 40 tablet 2  . metoprolol succinate (TOPROL-XL) 50 MG 24 hr tablet Take 1 tablet (50 mg total) by mouth daily. 90 tablet 1   No facility-administered medications prior to visit.     No Known Allergies  Review of Systems  Constitutional: Positive for malaise/fatigue. Negative for fever.  HENT: Negative for congestion.   Eyes: Negative for blurred vision, discharge and redness.  Respiratory: Negative for cough and shortness of breath.   Cardiovascular: Negative for chest pain, palpitations and leg swelling.  Gastrointestinal: Positive for abdominal pain. Negative for vomiting.  Musculoskeletal: Negative for back pain.  Skin: Negative for rash.  Neurological: Negative for loss of consciousness and headaches.       Objective:    Physical Exam  Constitutional: She is oriented to person, place, and time. She appears well-developed and well-nourished. No distress.  HENT:  Head: Normocephalic and atraumatic.  Eyes: Conjunctivae are normal.  Neck: Normal range of motion. No thyromegaly present.  Cardiovascular: Normal rate and regular rhythm.   Pulmonary/Chest: Effort normal and breath sounds normal. She has no wheezes.  Abdominal: Soft. Bowel sounds are normal. There is no tenderness.  Musculoskeletal: Normal range of motion. She exhibits no edema or deformity.  Neurological: She is alert and oriented to person, place, and time.  Skin: Skin is warm and dry. She is not diaphoretic.  Psychiatric: She has a normal mood and affect.    BP (!) 100/52 (BP Location: Left Arm, Patient Position: Sitting, Cuff Size: Normal)   Pulse (!) 51   Temp 98 F (36.7 C) (Oral)   Resp 18   Wt 125 lb (56.7 kg)   SpO2 97%   BMI 25.25 kg/m  Wt Readings from Last 3 Encounters:  01/13/17 124 lb 12.8 oz (56.6 kg)  01/11/17 124 lb 6.4 oz (56.4 kg)  01/10/17 125 lb (56.7 kg)   BP Readings from Last 3  Encounters:  01/13/17 116/70  01/11/17 131/71  01/10/17 (!) 100/52     Immunization History  Administered Date(s) Administered  . Influenza, High Dose Seasonal PF 03/11/2015, 02/05/2016  . Pneumococcal Polysaccharide-23 05/23/2014  . Td 02/05/2016  . Zoster 05/24/2009    Health Maintenance  Topic Date Due  . PNA vac Low Risk Adult (2 of 2 - PCV13) 05/24/2015  . INFLUENZA VACCINE  12/22/2016  . COLONOSCOPY  08/04/2025  . TETANUS/TDAP  02/04/2026  . DEXA SCAN  Completed    Lab Results  Component Value Date   WBC 5.6 12/07/2016  HGB 11.7 (L) 12/07/2016   HCT 34.2 (L) 12/07/2016   PLT 232.0 12/07/2016   GLUCOSE 109 (H) 12/07/2016   CHOL 219 (H) 11/04/2016   TRIG 166.0 (H) 11/04/2016   HDL 63.40 11/04/2016   LDLCALC 122 (H) 11/04/2016   ALT 14 12/07/2016   AST 21 12/07/2016   NA 135 12/07/2016   K 4.3 12/07/2016   CL 103 12/07/2016   CREATININE 1.05 12/07/2016   BUN 31 (H) 12/07/2016   CO2 26 12/07/2016   TSH 2.04 11/04/2016   HGBA1C 6.1 11/04/2016    Lab Results  Component Value Date   TSH 2.04 11/04/2016   Lab Results  Component Value Date   WBC 5.6 12/07/2016   HGB 11.7 (L) 12/07/2016   HCT 34.2 (L) 12/07/2016   MCV 89.4 12/07/2016   PLT 232.0 12/07/2016   Lab Results  Component Value Date   NA 135 12/07/2016   K 4.3 12/07/2016   CO2 26 12/07/2016   GLUCOSE 109 (H) 12/07/2016   BUN 31 (H) 12/07/2016   CREATININE 1.05 12/07/2016   BILITOT 0.6 12/07/2016   ALKPHOS 49 12/07/2016   AST 21 12/07/2016   ALT 14 12/07/2016   PROT 7.1 12/07/2016   ALBUMIN 4.2 12/07/2016   CALCIUM 9.2 12/07/2016   GFR 54.30 (L) 12/07/2016   Lab Results  Component Value Date   CHOL 219 (H) 11/04/2016   Lab Results  Component Value Date   HDL 63.40 11/04/2016   Lab Results  Component Value Date   LDLCALC 122 (H) 11/04/2016   Lab Results  Component Value Date   TRIG 166.0 (H) 11/04/2016   Lab Results  Component Value Date   CHOLHDL 3 11/04/2016   Lab  Results  Component Value Date   HGBA1C 6.1 11/04/2016         Assessment & Plan:   Problem List Items Addressed This Visit    Hyperlipidemia    Decreased visual acuity      Essential hypertension    Over controlled. Drop Metoprolol got 12.5 mg daily. . Encouraged heart healthy diet such as the DASH diet and exercise as tolerated.       Hypothyroid    On Levothyroxine, continue to monitor      Low back pain    Referred to sports medicine for further consideration.       Diarrhea   Urinary tract infection   Relevant Orders   Urinalysis (Completed)   Urine Culture (Completed)   Hyperglycemia     minimize simple carbs. Increase exercise as tolerated.       Abdominal pain    Will start with an ultrasound, if no obvious lesion but pain persists. So will proceed with ultrasound and CT scan if persists. Check UA with culture.       Relevant Orders   US Abdomen Complete (Completed)   Decreased visual acuity   Relevant Orders   Ambulatory referral to Ophthalmology   Attention deficit disorder    Referred to behavioral health for further evaluation to confirm diagnosis.       Relevant Orders   Ambulatory referral to [place]    Other Visit Diagnoses    Sacrum sprain, sequela    -  Primary   Relevant Orders   Ambulatory referral to Sports Medicine   Celiac disease       Relevant Orders   US Abdomen Complete (Completed)   Positive H. pylori test       Nonintractable headache,  unspecified chronicity pattern, unspecified headache type       Relevant Medications   citalopram (CELEXA) 20 MG tablet   Other Relevant Orders   Ambulatory referral to Ophthalmology      I have discontinued Ms. Mullaly's hyoscyamine and metoprolol succinate. I am also having her maintain her Multiple Vitamins-Minerals (MULTIVITAMIN ADULT PO), Collagen-Vitamin C (COLLAGEN PLUS VITAMIN C PO), aspirin, Vitamin D (Ergocalciferol), levothyroxine, beclomethasone, losartan, and  citalopram.  Meds ordered this encounter  Medications  . DISCONTD: hyoscyamine (LEVSIN SL) 0.125 MG SL tablet    Sig: Place 1 tablet (0.125 mg total) under the tongue every 4 (four) hours as needed.    Dispense:  40 tablet    Refill:  2  . citalopram (CELEXA) 20 MG tablet    Sig: Take 1 tablet (20 mg total) by mouth daily.    Dispense:  90 tablet    Refill:  1  . DISCONTD: metoprolol succinate (TOPROL-XL) 25 MG 24 hr tablet    Sig: Take 1 tablet (25 mg total) by mouth daily.    Dispense:  30 tablet    Refill:  3    CMA served as scribe during this visit. History, Physical and Plan performed by medical provider. Documentation and orders reviewed and attested to.  Penni Homans, MD

## 2017-01-11 ENCOUNTER — Ambulatory Visit (HOSPITAL_BASED_OUTPATIENT_CLINIC_OR_DEPARTMENT_OTHER)
Admission: RE | Admit: 2017-01-11 | Discharge: 2017-01-11 | Disposition: A | Discharge status: Home / Self Care | Payer: Medicare HMO | Source: Ambulatory Visit | Attending: Family Medicine | Admitting: Family Medicine

## 2017-01-11 ENCOUNTER — Ambulatory Visit (INDEPENDENT_AMBULATORY_CARE_PROVIDER_SITE_OTHER): Payer: Medicare HMO | Admitting: Family Medicine

## 2017-01-11 ENCOUNTER — Encounter: Payer: Self-pay | Admitting: Family Medicine

## 2017-01-11 DIAGNOSIS — R109 Unspecified abdominal pain: Secondary | ICD-10-CM | POA: Diagnosis not present

## 2017-01-11 DIAGNOSIS — K900 Celiac disease: Secondary | ICD-10-CM

## 2017-01-11 DIAGNOSIS — Z9049 Acquired absence of other specified parts of digestive tract: Secondary | ICD-10-CM | POA: Diagnosis not present

## 2017-01-11 DIAGNOSIS — M545 Low back pain: Secondary | ICD-10-CM | POA: Diagnosis not present

## 2017-01-11 LAB — URINE CULTURE: Organism ID, Bacteria: NO GROWTH

## 2017-01-11 NOTE — Progress Notes (Signed)
PCP: [name], MD  Subjective:   HPI: Patient is a 76 y.o. female here for low back pain.  Patient reports about 3 weeks ago she was up on the counter cleaning the bathroom mirror. She stepped back down to get onto her ladder when it slipped and called her to fall backwards, land directly onto buttocks, low back. Also struck head on floor but did not lose consciousness. Has had pain low back, sacrum area since then. She reports normally having pain in low back - told has degenerative changes and was recommended to have surgery at one point but she declined. She's been taking advil,, using salon pas patches since this injury 3 weeks ago. Pain is 0/10 at rest but gets soreness worse with bending. No radiation. No skin changes. No numbness or tingling. No bowel/bladder dysfunction.  Past Medical History:  Diagnosis Date  . Asthma   . Breast cancer ([place])    2002, left, encapsulated, microcalcifications. lumpectomy, radtiation x 30  . Breast cancer in female [facility])   . Change in mole 06/08/2016  . Colitis   . Dyspnea 11/04/2016  . Dysuria 11/04/2016  . Gluten intolerance   . History of chicken pox   . History of Helicobacter pylori infection 08/01/2015  . Hyperlipidemia   . Hypertension   . Hypothyroid   . IBS (irritable bowel syndrome)   . Low back pain 08/10/2015  . Osteoporosis   . Stomach cramps   . Urinary tract infection 11/04/2016    Current Outpatient Prescriptions on File Prior to Visit  Medication Sig Dispense Refill  . albuterol (PROAIR HFA) 108 (90 Base) MCG/ACT inhaler 2 pffs every 4 hours as needed 1 Inhaler 1  . aspirin 81 MG tablet Take 81 mg by mouth daily.    . beclomethasone (QVAR REDIHALER) 80 MCG/ACT inhaler Inhale 2 puffs into the lungs 2 (two) times daily. 1 Inhaler 0  . citalopram (CELEXA) 20 MG tablet Take 1 tablet (20 mg total) by mouth daily. 90 tablet 1  . Collagen-Vitamin C (COLLAGEN PLUS VITAMIN C PO) Take 1 tablet by mouth daily.    .  hyoscyamine (LEVSIN SL) 0.125 MG SL tablet Place 1 tablet (0.125 mg total) under the tongue every 4 (four) hours as needed. 40 tablet 2  . levothyroxine (SYNTHROID, LEVOTHROID) 50 MCG tablet TAKE ONE TABLET BY MOUTH ONCE DAILY 90 tablet 1  . losartan (COZAAR) 50 MG tablet Take 1 tablet (50 mg total) by mouth at bedtime. 90 tablet 1  . metoprolol succinate (TOPROL-XL) 25 MG 24 hr tablet Take 1 tablet (25 mg total) by mouth daily. 30 tablet 3  . Multiple Vitamins-Minerals (MULTIVITAMIN ADULT PO) Take 1 tablet by mouth daily.    . Vitamin D, Ergocalciferol, (DRISDOL) 50000 units CAPS capsule Take 1 capsule (50,000 Units total) by mouth every 7 (seven) days. 12 capsule 1   No current facility-administered medications on file prior to visit.     Past Surgical History:  Procedure Laterality Date  . ABDOMINAL HYSTERECTOMY     partial  . APPENDECTOMY    . BREAST LUMPECTOMY Left 2002  . CHOLECYSTECTOMY  Age 29 or 75  . COLONOSCOPY  2017  . FRACTURE SURGERY    . TONSILLECTOMY      No Known Allergies  Social History   Social History  . Marital status: Divorced    Spouse name: N/A  . Number of children: N/A  . Years of education: N/A   Occupational History  . retired  teacher, kindergarten   Social History Main Topics  . Smoking status: Never Smoker  . Smokeless tobacco: Never Used  . Alcohol use No  . Drug use: No  . Sexual activity: No     Comment: lives alone, avoids daiiry and gluten. 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 Not on file   Social History Narrative  . No narrative on file    Family History  Problem Relation Age of Onset  . Colitis Mother   . Irritable bowel syndrome Mother   . Hypertension Father   . Hyperlipidemia Brother   . Heart attack Brother   . Stomach cancer Paternal Grandmother 63  . Hyperlipidemia Brother   . Heart attack Brother   . Hyperlipidemia Brother   . Heart attack Brother   . Hyperlipidemia Sister   . Leukemia  Daughter   . Arthritis Daughter   . Heart disease Daughter        ASD vs VSD    BP 131/71   Pulse (!) 52   Ht 4\' 11"  (1.499 m)   Wt 124 lb 6.4 oz (56.4 kg)   BMI 25.13 kg/m   Review of Systems: See HPI above.     Objective:  Physical Exam:  Gen: NAD, comfortable in exam room  Back: No gross deformity, scoliosis. TTP over sacrum in midline and paraspinal lower lumbar regions.  No lumbar midline or other bony TTP. FROM with pain on flexion. Strength LEs 5/5 all muscle groups.   2+ MSRs in patellar and achilles tendons, equal bilaterally. Negative SLRs. Sensation intact to light touch bilaterally. Negative logroll bilateral hips Negative fabers and piriformis stretches.   Assessment & Plan:  1. Low back pain - exam is reassuring.  She does not have any midline lumbar spinous process tenderness to warrant concern for compression fracture.  Consistent with sacral contusion and lumbar strain.  Tylenol, aleve, salon pas.  Shown home exercises and stretches to do daily.  F/u in 5-6 weeks.  Consider imaging, physical therapy if not improving.

## 2017-01-11 NOTE — Assessment & Plan Note (Signed)
exam is reassuring.  She does not have any midline lumbar spinous process tenderness to warrant concern for compression fracture.  Consistent with sacral contusion and lumbar strain.  Tylenol, aleve, salon pas.  Shown home exercises and stretches to do daily.  F/u in 5-6 weeks.  Consider imaging, physical therapy if not improving.

## 2017-01-11 NOTE — Patient Instructions (Signed)
You have a sacral contusion and lumbar strain. Ok to take tylenol for baseline pain relief (1-2 extra strength tabs 3x/day) Aleve 2 tabs twice a day with food for pain and inflammation as needed. Continue with the salon pas patches. Stay as active as possible. Do home exercises and stretches as directed - hold each for 20-30 seconds and do each one three times. Consider massage, chiropractor, physical therapy, and/or acupuncture. Physical therapy has been shown to be helpful while the others have mixed results. Strengthening of low back muscles, abdominal musculature are key for long term pain relief. If not improving, will consider further imaging (MRI). Follow up with me in 5-6 weeks or as needed if you're doing well.

## 2017-01-13 ENCOUNTER — Encounter: Payer: Self-pay | Admitting: Cardiovascular Disease

## 2017-01-13 ENCOUNTER — Ambulatory Visit (INDEPENDENT_AMBULATORY_CARE_PROVIDER_SITE_OTHER): Payer: Medicare HMO | Admitting: Cardiovascular Disease

## 2017-01-13 VITALS — BP 116/70 | HR 55 | Ht 59.0 in | Wt 124.8 lb

## 2017-01-13 DIAGNOSIS — Q245 Malformation of coronary vessels: Secondary | ICD-10-CM | POA: Diagnosis not present

## 2017-01-13 HISTORY — DX: Malformation of coronary vessels: Q24.5

## 2017-01-13 MED ORDER — METOPROLOL SUCCINATE ER 25 MG PO TB24: 12.5000 mg | ORAL_TABLET | Freq: Every day | ORAL | 3 refills | Status: DC

## 2017-01-13 NOTE — Progress Notes (Signed)
Cardiology Office Note:    Date:  01/13/2017   ID:  Julie Wilkerson, DOB [birth year], MRN [id number]  PCP:  [name], MD  Cardiologist:  Mertie Moores, MD    Referring MD: [name], MD   Problem list 1.  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  Essential hypertension 3. Hypothyroidism 4.   Exertional shortness of breath/chest pain  Chief Complaint  Patient presents with  . Chest Pain     History of Present Illness:    Julie Wilkerson is a 76 y.o. female with a hx of Chest pain . She has not been feeling well for 3 months. Has significant DOE  Previously very active but now cannot do any exercise  Has exertional CP . Pressing like CP ,  Heaviness,  Last several months  Associated with some left arm numbness.   Happens frequently with exercise,  Also can occur with emottional stress.   Has some occasional headache and dizziness  Had a severe episode of HA, CP  Had a heart cath in [place] - was found to have myocardial bridging .  Was started on metoprolol at that time   Past Medical History:  Diagnosis Date  . Asthma   . Breast cancer ([place])    2002, left, encapsulated, microcalcifications. lumpectomy, radtiation x 30  . Breast cancer in female [facility])   . Change in mole 06/08/2016  . Colitis   . Dyspnea 11/04/2016  . Dysuria 11/04/2016  . Gluten intolerance   . History of chicken pox   . History of Helicobacter pylori infection 08/01/2015  . Hyperlipidemia   . Hypertension   . Hypothyroid   . IBS (irritable bowel syndrome)   . Low back pain 08/10/2015  . Osteoporosis   . Stomach cramps   . Urinary tract infection 11/04/2016    Past Surgical History:  Procedure Laterality Date  . ABDOMINAL HYSTERECTOMY     partial  . APPENDECTOMY    . BREAST LUMPECTOMY Left 2002  . CHOLECYSTECTOMY  Age 18 or 62  . COLONOSCOPY  2017  . FRACTURE SURGERY    . TONSILLECTOMY      Current Medications: Current Meds  Medication Sig  . albuterol (PROVENTIL HFA;VENTOLIN HFA)  108 (90 Base) MCG/ACT inhaler Inhale 2 puffs into the lungs every 4 (four) hours as needed for wheezing or shortness of breath.  Marland Kitchen aspirin 81 MG tablet Take 81 mg by mouth daily.  . beclomethasone (QVAR REDIHALER) 80 MCG/ACT inhaler Inhale 2 puffs into the lungs 2 (two) times daily.  . citalopram (CELEXA) 20 MG tablet Take 1 tablet (20 mg total) by mouth daily.  . Collagen-Vitamin C (COLLAGEN PLUS VITAMIN C PO) Take 1 tablet by mouth daily.  . hyoscyamine (LEVSIN SL) 0.125 MG SL tablet Place 0.125 mg under the tongue every 4 (four) hours as needed for cramping.  Marland Kitchen levothyroxine (SYNTHROID, LEVOTHROID) 50 MCG tablet TAKE ONE TABLET BY MOUTH ONCE DAILY  . losartan (COZAAR) 50 MG tablet Take 1 tablet (50 mg total) by mouth at bedtime.  . metoprolol succinate (TOPROL-XL) 25 MG 24 hr tablet Take 1 tablet (25 mg total) by mouth daily.  . Multiple Vitamins-Minerals (MULTIVITAMIN ADULT PO) Take 1 tablet by mouth daily.  . Vitamin D, Ergocalciferol, (DRISDOL) 50000 units CAPS capsule Take 1 capsule (50,000 Units total) by mouth every 7 (seven) days.     Allergies:   Patient has no known allergies.   Social History   Social History  . Marital  status: Divorced    Spouse name: N/A  . Number of children: N/A  . Years of education: N/A   Occupational History  . retired     Pharmacist, hospital, kindergarten   Social History Main Topics  . Smoking status: Never Smoker  . Smokeless tobacco: Never Used  . Alcohol use No  . Drug use: No  . Sexual activity: No     Comment: lives alone, avoids daiiry and gluten. 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 None   Social History Narrative  . None     Family History: The patient's family history includes Arthritis in her daughter; Colitis in her mother; Heart attack in her brother, brother, and brother; Heart disease in her daughter; Hyperlipidemia in her brother, brother, brother, and sister; Hypertension in her father; Irritable bowel syndrome in her  mother; Leukemia in her daughter; Stomach cancer (age of onset: 42) in her paternal grandmother. ROS:   Please see the history of present illness.     All other systems reviewed and are negative.  EKGs/Labs/Other Studies Reviewed:    The following studies were reviewed today: Records from primary MD   EKG:  EKG is not  ordered today.      Recent Labs: 11/04/2016: TSH 2.04 12/07/2016: ALT 14; BUN 31; Creatinine, Ser 1.05; Hemoglobin 11.7; Platelets 232.0; Potassium 4.3; Sodium 135  Recent Lipid Panel    Component Value Date/Time   CHOL 219 (H) 11/04/2016 1211   TRIG 166.0 (H) 11/04/2016 1211   HDL 63.40 11/04/2016 1211   CHOLHDL 3 11/04/2016 1211   VLDL 33.2 11/04/2016 1211   LDLCALC 122 (H) 11/04/2016 1211    Physical Exam:    VS:  BP 116/70   Pulse (!) 55   Ht 4\' 11"  (1.499 m)   Wt 124 lb 12.8 oz (56.6 kg)   BMI 25.21 kg/m     Wt Readings from Last 3 Encounters:  01/13/17 124 lb 12.8 oz (56.6 kg)  01/11/17 124 lb 6.4 oz (56.4 kg)  01/10/17 125 lb (56.7 kg)     GEN:  Well nourished, well developed in no acute distress HEENT: Normal NECK: No JVD; No carotid bruits LYMPHATICS: No lymphadenopathy CARDIAC: RR, no murmurs, rubs, gallops RESPIRATORY:  Clear to auscultation without rales, wheezing or rhonchi  ABDOMEN: Soft, non-tender, non-distended MUSCULOSKELETAL:  No edema; No deformity  SKIN: Warm and dry NEUROLOGIC:  Alert and oriented x 3 PSYCHIATRIC:  Normal affect   ASSESSMENT:    No diagnosis found. PLAN:    In order of problems listed above:  1. History of myocardial bridging: That he has done well on her metoprolol therapy but recently has developed some fatigue and shortness of breath. She also has episodes of chest pain. Given her symptoms of generalized weakness, I think we should decrease the dose of Toprol-XL to 12.5 mg a day.  We will watch her closely to make sure she doesn't develop any signs of coronary ischemia.  We'll schedule her for a  [employer] study for further evaluation of her chest discomfort.  I'll see her in 3 months for follow-up visit.   Medication Adjustments/Labs and Tests Ordered: Current medicines are reviewed at length with the patient today.  Concerns regarding medicines are outlined above.  No orders of the defined types were placed in this encounter.  No orders of the defined types were placed in this encounter.   Signed, Mertie Moores, MD  01/13/2017 1:51 PM    [facility]

## 2017-01-13 NOTE — Patient Instructions (Signed)
Medication Instructions:  DECREASE Toprol (Metoprolol) to 12.5 mg once daily   Labwork: None Ordered   Testing/Procedures: Your physician has requested that you have a lexiscan myoview. For further information please visit [email or web address]. Please follow instruction sheet, as given.    Follow-Up: Your physician recommends that you schedule a follow-up appointment in: 3 months with Dr. Acie Fredrickson   If you need a refill on your cardiac medications before your next appointment, please call your pharmacy.   Thank you for choosing [facility]! Christen Bame, RN [phone]

## 2017-01-16 ENCOUNTER — Encounter: Payer: Self-pay | Admitting: Family Medicine

## 2017-01-16 DIAGNOSIS — H547 Unspecified visual loss: Secondary | ICD-10-CM

## 2017-01-16 DIAGNOSIS — F900 Attention-deficit hyperactivity disorder, predominantly inattentive type: Secondary | ICD-10-CM | POA: Insufficient documentation

## 2017-01-16 DIAGNOSIS — F988 Other specified behavioral and emotional disorders with onset usually occurring in childhood and adolescence: Secondary | ICD-10-CM | POA: Insufficient documentation

## 2017-01-16 HISTORY — DX: Unspecified visual loss: H54.7

## 2017-01-16 NOTE — Assessment & Plan Note (Addendum)
Over controlled. Drop Metoprolol got 12.5 mg daily. . Encouraged heart healthy diet such as the DASH diet and exercise as tolerated.

## 2017-01-16 NOTE — Assessment & Plan Note (Signed)
Decreased visual acuity

## 2017-01-16 NOTE — Assessment & Plan Note (Signed)
On Levothyroxine, continue to monitor 

## 2017-01-16 NOTE — Assessment & Plan Note (Signed)
minimize simple carbs. Increase exercise as tolerated.  

## 2017-01-16 NOTE — Assessment & Plan Note (Signed)
Referred to behavioral health for further evaluation to confirm diagnosis.

## 2017-01-16 NOTE — Assessment & Plan Note (Signed)
Referred to sports medicine for further consideration.

## 2017-01-17 ENCOUNTER — Telehealth (HOSPITAL_COMMUNITY): Payer: Self-pay | Admitting: *Deleted

## 2017-01-17 NOTE — Telephone Encounter (Signed)
Patient given detailed instructions per Myocardial Perfusion Study Information Sheet for the test on 01/18/17. Patient notified to arrive 15 minutes early and that it is imperative to arrive on time for appointment to keep from having the test rescheduled.  If you need to cancel or reschedule your appointment, please call the office within 24 hours of your appointment. . Patient verbalized understanding. Kirstie Peri

## 2017-01-18 ENCOUNTER — Ambulatory Visit (HOSPITAL_COMMUNITY): Payer: Medicare HMO | Attending: Cardiology

## 2017-01-18 DIAGNOSIS — R0789 Other chest pain: Secondary | ICD-10-CM | POA: Diagnosis not present

## 2017-01-18 DIAGNOSIS — I10 Essential (primary) hypertension: Secondary | ICD-10-CM | POA: Diagnosis not present

## 2017-01-18 DIAGNOSIS — R0609 Other forms of dyspnea: Secondary | ICD-10-CM | POA: Insufficient documentation

## 2017-01-18 DIAGNOSIS — R42 Dizziness and giddiness: Secondary | ICD-10-CM | POA: Insufficient documentation

## 2017-01-18 DIAGNOSIS — R9439 Abnormal result of other cardiovascular function study: Secondary | ICD-10-CM | POA: Insufficient documentation

## 2017-01-18 DIAGNOSIS — R110 Nausea: Secondary | ICD-10-CM

## 2017-01-18 DIAGNOSIS — Q245 Malformation of coronary vessels: Secondary | ICD-10-CM | POA: Diagnosis not present

## 2017-01-18 LAB — MYOCARDIAL PERFUSION IMAGING
CHL CUP NUCLEAR SDS: 2
CHL CUP NUCLEAR SRS: 1
CHL CUP NUCLEAR SSS: 3
CSEPPHR: 80 {beats}/min
LV dias vol: 60 mL (ref 46–106)
LVSYSVOL: 21 mL
RATE: 0.28
Rest HR: 48 {beats}/min
TID: 1.03

## 2017-01-18 MED ORDER — AMINOPHYLLINE 25 MG/ML IV SOLN
75.0000 mg | Freq: Once | INTRAVENOUS | Status: AC
  Administered 2017-01-18: 75 mg via INTRAVENOUS

## 2017-01-18 MED ORDER — REGADENOSON 0.4 MG/5ML IV SOLN
0.4000 mg | Freq: Once | INTRAVENOUS | Status: AC
  Administered 2017-01-18: 0.4 mg via INTRAVENOUS

## 2017-01-18 MED ORDER — TECHNETIUM TC 99M TETROFOSMIN IV KIT
31.5000 | PACK | Freq: Once | INTRAVENOUS | Status: AC | PRN
  Administered 2017-01-18: 31.5 via INTRAVENOUS
  Filled 2017-01-18: qty 32

## 2017-01-18 MED ORDER — TECHNETIUM TC 99M TETROFOSMIN IV KIT
10.5000 | PACK | Freq: Once | INTRAVENOUS | Status: AC | PRN
Start: 2017-01-18 — End: 2017-01-18
  Administered 2017-01-18: 10.5 via INTRAVENOUS
  Filled 2017-01-18: qty 11

## 2017-01-28 DIAGNOSIS — H25813 Combined forms of age-related cataract, bilateral: Secondary | ICD-10-CM | POA: Diagnosis not present

## 2017-01-28 DIAGNOSIS — H524 Presbyopia: Secondary | ICD-10-CM | POA: Diagnosis not present

## 2017-01-28 DIAGNOSIS — H1859 Other hereditary corneal dystrophies: Secondary | ICD-10-CM | POA: Diagnosis not present

## 2017-01-28 DIAGNOSIS — H53042 Amblyopia suspect, left eye: Secondary | ICD-10-CM | POA: Diagnosis not present

## 2017-01-28 DIAGNOSIS — H04123 Dry eye syndrome of bilateral lacrimal glands: Secondary | ICD-10-CM | POA: Diagnosis not present

## 2017-02-03 ENCOUNTER — Ambulatory Visit: Payer: Medicare HMO | Admitting: Internal Medicine

## 2017-02-07 DIAGNOSIS — R69 Illness, unspecified: Secondary | ICD-10-CM | POA: Diagnosis not present

## 2017-02-17 ENCOUNTER — Ambulatory Visit: Payer: Medicare HMO | Admitting: Internal Medicine

## 2017-02-18 ENCOUNTER — Telehealth: Payer: Self-pay | Admitting: Family Medicine

## 2017-02-18 NOTE — Telephone Encounter (Signed)
Relation to OM:AYOK Call back number:[phone] Pharmacy: [facility], [place]. [phone] (Phone) [phone] (Fax)    Reason for call:  Patient states BP medication is not working and would like to go back on lisinopril (PRINIVIL,ZESTRIL) 20 MG tablet, please advise

## 2017-02-18 NOTE — Telephone Encounter (Signed)
Called the patient and BP high in the afternoons-- She is going to call back with her BP numbers and then can route to DOD

## 2017-03-01 ENCOUNTER — Ambulatory Visit (HOSPITAL_BASED_OUTPATIENT_CLINIC_OR_DEPARTMENT_OTHER)
Admission: RE | Admit: 2017-03-01 | Discharge: 2017-03-01 | Disposition: A | Discharge status: Home / Self Care | Payer: Medicare HMO | Source: Ambulatory Visit | Attending: Family Medicine | Admitting: Family Medicine

## 2017-03-01 ENCOUNTER — Encounter: Payer: Self-pay | Admitting: Family Medicine

## 2017-03-01 ENCOUNTER — Ambulatory Visit (INDEPENDENT_AMBULATORY_CARE_PROVIDER_SITE_OTHER): Payer: Medicare HMO | Admitting: Family Medicine

## 2017-03-01 VITALS — BP 110/48 | HR 50 | Temp 98.1°F | Resp 18 | Wt 125.6 lb

## 2017-03-01 DIAGNOSIS — M1612 Unilateral primary osteoarthritis, left hip: Secondary | ICD-10-CM | POA: Diagnosis not present

## 2017-03-01 DIAGNOSIS — E559 Vitamin D deficiency, unspecified: Secondary | ICD-10-CM

## 2017-03-01 DIAGNOSIS — M79605 Pain in left leg: Secondary | ICD-10-CM

## 2017-03-01 DIAGNOSIS — M949 Disorder of cartilage, unspecified: Secondary | ICD-10-CM | POA: Diagnosis not present

## 2017-03-01 DIAGNOSIS — J45991 Cough variant asthma: Secondary | ICD-10-CM

## 2017-03-01 DIAGNOSIS — M419 Scoliosis, unspecified: Secondary | ICD-10-CM | POA: Insufficient documentation

## 2017-03-01 DIAGNOSIS — M25552 Pain in left hip: Secondary | ICD-10-CM | POA: Insufficient documentation

## 2017-03-01 DIAGNOSIS — R1031 Right lower quadrant pain: Secondary | ICD-10-CM | POA: Diagnosis not present

## 2017-03-01 DIAGNOSIS — C50919 Malignant neoplasm of unspecified site of unspecified female breast: Secondary | ICD-10-CM

## 2017-03-01 DIAGNOSIS — I10 Essential (primary) hypertension: Secondary | ICD-10-CM

## 2017-03-01 DIAGNOSIS — R739 Hyperglycemia, unspecified: Secondary | ICD-10-CM | POA: Diagnosis not present

## 2017-03-01 DIAGNOSIS — N644 Mastodynia: Secondary | ICD-10-CM | POA: Diagnosis not present

## 2017-03-01 DIAGNOSIS — N63 Unspecified lump in unspecified breast: Secondary | ICD-10-CM

## 2017-03-01 DIAGNOSIS — E782 Mixed hyperlipidemia: Secondary | ICD-10-CM

## 2017-03-01 DIAGNOSIS — R102 Pelvic and perineal pain: Secondary | ICD-10-CM | POA: Insufficient documentation

## 2017-03-01 DIAGNOSIS — M47817 Spondylosis without myelopathy or radiculopathy, lumbosacral region: Secondary | ICD-10-CM | POA: Insufficient documentation

## 2017-03-01 DIAGNOSIS — M542 Cervicalgia: Secondary | ICD-10-CM

## 2017-03-01 DIAGNOSIS — E039 Hypothyroidism, unspecified: Secondary | ICD-10-CM

## 2017-03-01 HISTORY — DX: Pain in left leg: M79.605

## 2017-03-01 HISTORY — DX: Right lower quadrant pain: R10.31

## 2017-03-01 HISTORY — DX: Pain in left hip: M25.552

## 2017-03-01 HISTORY — DX: Cervicalgia: M54.2

## 2017-03-01 LAB — LIPID PANEL
Cholesterol: 236 mg/dL — ABNORMAL HIGH (ref 0–200)
HDL: 72.7 mg/dL (ref >39.00)
LDL Cholesterol: 140 mg/dL — ABNORMAL HIGH (ref 0–99)
NonHDL: 163.26
Total CHOL/HDL Ratio: 3
Triglycerides: 116 mg/dL (ref 0.0–149.0)
VLDL: 23.2 mg/dL (ref 0.0–40.0)

## 2017-03-01 LAB — CBC WITH DIFFERENTIAL/PLATELET
BASOS ABS: 0 10*3/uL (ref 0.0–0.1)
Basophils Relative: 0.3 % (ref 0.0–3.0)
Eosinophils Absolute: 0 10*3/uL (ref 0.0–0.7)
Eosinophils Relative: 0 % (ref 0.0–5.0)
HCT: 35.8 % — ABNORMAL LOW (ref 36.0–46.0)
HEMOGLOBIN: 11.8 g/dL — AB (ref 12.0–15.0)
LYMPHS ABS: 1.6 10*3/uL (ref 0.7–4.0)
Lymphocytes Relative: 34 % (ref 12.0–46.0)
MCHC: 32.8 g/dL (ref 30.0–36.0)
MCV: 91.3 fl (ref 78.0–100.0)
MONO ABS: 0.6 10*3/uL (ref 0.1–1.0)
MONOS PCT: 13.1 % — AB (ref 3.0–12.0)
NEUTROS PCT: 52.6 % (ref 43.0–77.0)
Neutro Abs: 2.5 10*3/uL (ref 1.4–7.7)
Platelets: 218 10*3/uL (ref 150.0–400.0)
RBC: 3.93 Mil/uL (ref 3.87–5.11)
RDW: 14.9 % (ref 11.5–15.5)
WBC: 4.7 10*3/uL (ref 4.0–10.5)

## 2017-03-01 LAB — URINALYSIS
Bilirubin Urine: NEGATIVE
HGB URINE DIPSTICK: NEGATIVE
Ketones, ur: NEGATIVE
Leukocytes, UA: NEGATIVE
NITRITE: NEGATIVE
Total Protein, Urine: NEGATIVE
UROBILINOGEN UA: 0.2 (ref 0.0–1.0)
Urine Glucose: NEGATIVE
pH: 5.5 (ref 5.0–8.0)

## 2017-03-01 LAB — HEMOGLOBIN A1C: HEMOGLOBIN A1C: 6 % (ref 4.6–6.5)

## 2017-03-01 LAB — SEDIMENTATION RATE: Sed Rate: 35 mm/hr — ABNORMAL HIGH (ref 0–30)

## 2017-03-01 LAB — TSH: TSH: 2.42 u[IU]/mL (ref 0.35–4.50)

## 2017-03-01 MED ORDER — LISINOPRIL 20 MG PO TABS: 20.0000 mg | ORAL_TABLET | Freq: Every day | ORAL | 3 refills | Status: DC

## 2017-03-01 NOTE — Assessment & Plan Note (Addendum)
Describes a stabbing pain in RLQ about 20 or 30 minutes after eating and lasts roughly 20-30 minutes before subsiding happens after most meals. No fevers, no bloody or tarry stool. Moving bowels normally 2-3 x daily, formed. Abdominal xray

## 2017-03-01 NOTE — Assessment & Plan Note (Signed)
On Levothyroxine, continue to monitor 

## 2017-03-01 NOTE — Assessment & Plan Note (Addendum)
Well controlled, no changes to meds. patient did switch back to lisinopril because losartan did not control the BP and the cough did not improve off of Lisinopril. is now improved with time. Likely patient cough from Bronchitis she picked up in [place] combined with her history of asthma Check labs

## 2017-03-01 NOTE — Assessment & Plan Note (Signed)
Encouraged heart healthy diet, increase exercise, avoid trans fats, consider a krill oil cap daily 

## 2017-03-01 NOTE — Assessment & Plan Note (Signed)
hgba1c acceptable, minimize simple carbs. Increase exercise as tolerated. Continue current meds 

## 2017-03-01 NOTE — Assessment & Plan Note (Signed)
Encouraged moist heat and gentle stretching as tolerated. May try NSAIDs and prescription meds as directed and report if symptoms worsen or seek immediate care 

## 2017-03-01 NOTE — Progress Notes (Signed)
Subjective:  I acted as a Education administrator for Dr. Charlett Blake. [name], [place]  Patient ID: Julie Wilkerson, female    DOB: August 14, 1940, 76 y.o.   MRN: [id number]  No chief complaint on file.   HPI  Patient is in today for an acute she c/o not feeling well. She has many complaints today. She notes her bowels are currently moving 2-3 times a day formed not bloody or black but she is having intermittent right lower quadrant pain. The pain occurs about 20 minutes after eating a last about 20 minutes and happen several times each day. No nausea vomiting. She switched off of her losartan because it was not holding her blood pressure systolics were in the 782N. She is back on lisinopril and her blood pressures much better and the headache she was having have resolved. Her cough is slowly improved as her bronchitis she picked up in [place] has resolved. She is not returned to pulmonology right now her breathing is much better and she is not needing her albuterol. She's complaining of left leg pain. She denotes a long history years ago of leg pain but 2 weeks ago she had a sudden onset of anterior but left hip pain that is radiating down her entire leg. She also complains of numbness in both hands and arms sometimes upon awakening and in her legs as well. Does not occur during the day. No recent falls or trauma. No fevers or chills. Does acknowledge some anxiety and malaise. Is also having left breast pain. Has a distant history of left breast cancer was diagnosed in [place] and treated with a lumpectomy and radiation many years ago dating back to 2002 now she has persistent pain in her left breast that radiates to her left back symptoms occur daily. Review of ultrasound from radiology recommends an MRI of the breast due to her persistent symptoms so this is ordered today. No discharge or skin changes. Denies palp/SOB/HA/congestion/fevers or GU c/o. Taking meds as prescribed  Patient Care Team: [name], MD as PCP - General  (Family Medicine) Lake Bells., MD as Consulting Physician (Gastroenterology) Belva Bertin, Armida Sans as Physician Assistant (Family Medicine)   Past Medical History:  Diagnosis Date  . Asthma   . Breast cancer ([place])    2002, left, encapsulated, microcalcifications. lumpectomy, radtiation x 30  . Breast cancer in female [facility])   . Change in mole 06/08/2016  . Colitis   . Decreased visual acuity 01/16/2017  . Dyspnea 11/04/2016  . Dysuria 11/04/2016  . Gluten intolerance   . History of chicken pox   . History of Helicobacter pylori infection 08/01/2015  . Hyperlipidemia   . Hypertension   . Hypothyroid   . IBS (irritable bowel syndrome)   . Left leg pain 03/01/2017  . Low back pain 08/10/2015  . Neck pain 03/01/2017  . Osteoporosis   . RLQ discomfort 03/01/2017  . Stomach cramps   . Urinary tract infection 11/04/2016    Past Surgical History:  Procedure Laterality Date  . ABDOMINAL HYSTERECTOMY     partial  . APPENDECTOMY    . BREAST LUMPECTOMY Left 2002  . CHOLECYSTECTOMY  Age 23 or 76  . COLONOSCOPY  2017  . FRACTURE SURGERY    . TONSILLECTOMY      Family History  Problem Relation Age of Onset  . Colitis Mother   . Irritable bowel syndrome Mother   . Hypertension Father   . Hyperlipidemia Brother   . Heart attack Brother   .  Stomach cancer Paternal Grandmother 18  . Hyperlipidemia Brother   . Heart attack Brother   . Hyperlipidemia Brother   . Heart attack Brother   . Hyperlipidemia Sister   . Leukemia Daughter   . Arthritis Daughter   . Heart disease Daughter        ASD vs VSD    Social History   Social History  . Marital status: Divorced    Spouse name: N/A  . Number of children: N/A  . Years of education: N/A   Occupational History  . retired     Pharmacist, hospital, kindergarten   Social History Main Topics  . Smoking status: Never Smoker  . Smokeless tobacco: Never Used  . Alcohol use No  . Drug use: No  . Sexual activity: No     Comment: lives alone,  avoids daiiry and gluten. 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 Not on file   Social History Narrative  . No narrative on file    Outpatient Medications Prior to Visit  Medication Sig Dispense Refill  . albuterol (PROVENTIL HFA;VENTOLIN HFA) 108 (90 Base) MCG/ACT inhaler Inhale 2 puffs into the lungs every 4 (four) hours as needed for wheezing or shortness of breath.    Marland Kitchen aspirin 81 MG tablet Take 81 mg by mouth daily.    . beclomethasone (QVAR REDIHALER) 80 MCG/ACT inhaler Inhale 2 puffs into the lungs 2 (two) times daily. 1 Inhaler 0  . citalopram (CELEXA) 20 MG tablet Take 1 tablet (20 mg total) by mouth daily. 90 tablet 1  . Collagen-Vitamin C (COLLAGEN PLUS VITAMIN C PO) Take 1 tablet by mouth daily.    . hyoscyamine (LEVSIN SL) 0.125 MG SL tablet Place 0.125 mg under the tongue every 4 (four) hours as needed for cramping.    Marland Kitchen levothyroxine (SYNTHROID, LEVOTHROID) 50 MCG tablet TAKE ONE TABLET BY MOUTH ONCE DAILY 90 tablet 1  . metoprolol succinate (TOPROL XL) 25 MG 24 hr tablet Take 0.5 tablets (12.5 mg total) by mouth daily. 45 tablet 3  . Multiple Vitamins-Minerals (MULTIVITAMIN ADULT PO) Take 1 tablet by mouth daily.    . Vitamin D, Ergocalciferol, (DRISDOL) 50000 units CAPS capsule Take 1 capsule (50,000 Units total) by mouth every 7 (seven) days. 12 capsule 1  . losartan (COZAAR) 50 MG tablet Take 1 tablet (50 mg total) by mouth at bedtime. 90 tablet 1   No facility-administered medications prior to visit.     No Known Allergies  Review of Systems  Constitutional: Positive for malaise/fatigue. Negative for fever.  HENT: Negative for congestion.   Eyes: Negative for blurred vision.  Respiratory: Negative for cough and shortness of breath.   Cardiovascular: Positive for chest pain. Negative for palpitations and leg swelling.  Gastrointestinal: Positive for abdominal pain. Negative for constipation, heartburn, nausea and vomiting.  Genitourinary: Positive for  frequency. Negative for dysuria.  Musculoskeletal: Positive for back pain and joint pain.  Skin: Negative for rash.  Neurological: Negative for loss of consciousness and headaches.  Psychiatric/Behavioral: The patient is nervous/anxious.        Objective:    Physical Exam  Constitutional: She is oriented to person, place, and time. She appears well-developed and well-nourished. No distress.  HENT:  Head: Normocephalic and atraumatic.  Eyes: Conjunctivae are normal.  Neck: Normal range of motion. No thyromegaly present.  Cardiovascular: Normal rate and regular rhythm.   Pulmonary/Chest: Effort normal and breath sounds normal. She has no wheezes.  Abdominal: Soft. Bowel sounds  are normal. There is no tenderness.  Genitourinary:  Genitourinary Comments: Left breast scar c/w lumpectomy and tenderness and fullness at 12 oclcock and 3 oclock  Musculoskeletal: Normal range of motion. She exhibits no edema or deformity.  Neurological: She is alert and oriented to person, place, and time.  Skin: Skin is warm and dry. She is not diaphoretic.  Psychiatric: She has a normal mood and affect.    BP (!) 110/48 (BP Location: Left Arm, Patient Position: Sitting, Cuff Size: Normal)   Pulse (!) 50   Temp 98.1 F (36.7 C) (Oral)   Resp 18   Wt 125 lb 9.6 oz (57 kg)   SpO2 98%   BMI 25.37 kg/m  Wt Readings from Last 3 Encounters:  03/01/17 125 lb 9.6 oz (57 kg)  01/13/17 124 lb 12.8 oz (56.6 kg)  01/11/17 124 lb 6.4 oz (56.4 kg)   BP Readings from Last 3 Encounters:  03/01/17 (!) 110/48  01/13/17 116/70  01/11/17 131/71     Immunization History  Administered Date(s) Administered  . Influenza, High Dose Seasonal PF 03/11/2015, 02/05/2016, 02/05/2017  . Influenza,inj,quad, With Preservative 05/23/2014  . Pneumococcal Polysaccharide-23 05/23/2014  . Td 02/05/2016  . Zoster 05/24/2009    Health Maintenance  Topic Date Due  . PNA vac Low Risk Adult (2 of 2 - PCV13) 05/24/2015  .  COLONOSCOPY  08/04/2025  . TETANUS/TDAP  02/04/2026  . INFLUENZA VACCINE  Completed  . DEXA SCAN  Completed    Lab Results  Component Value Date   WBC 5.6 12/07/2016   HGB 11.7 (L) 12/07/2016   HCT 34.2 (L) 12/07/2016   PLT 232.0 12/07/2016   GLUCOSE 109 (H) 12/07/2016   CHOL 219 (H) 11/04/2016   TRIG 166.0 (H) 11/04/2016   HDL 63.40 11/04/2016   LDLCALC 122 (H) 11/04/2016   ALT 14 12/07/2016   AST 21 12/07/2016   NA 135 12/07/2016   K 4.3 12/07/2016   CL 103 12/07/2016   CREATININE 1.05 12/07/2016   BUN 31 (H) 12/07/2016   CO2 26 12/07/2016   TSH 2.04 11/04/2016   HGBA1C 6.1 11/04/2016    Lab Results  Component Value Date   TSH 2.04 11/04/2016   Lab Results  Component Value Date   WBC 5.6 12/07/2016   HGB 11.7 (L) 12/07/2016   HCT 34.2 (L) 12/07/2016   MCV 89.4 12/07/2016   PLT 232.0 12/07/2016   Lab Results  Component Value Date   NA 135 12/07/2016   K 4.3 12/07/2016   CO2 26 12/07/2016   GLUCOSE 109 (H) 12/07/2016   BUN 31 (H) 12/07/2016   CREATININE 1.05 12/07/2016   BILITOT 0.6 12/07/2016   ALKPHOS 49 12/07/2016   AST 21 12/07/2016   ALT 14 12/07/2016   PROT 7.1 12/07/2016   ALBUMIN 4.2 12/07/2016   CALCIUM 9.2 12/07/2016   GFR 54.30 (L) 12/07/2016   Lab Results  Component Value Date   CHOL 219 (H) 11/04/2016   Lab Results  Component Value Date   HDL 63.40 11/04/2016   Lab Results  Component Value Date   LDLCALC 122 (H) 11/04/2016   Lab Results  Component Value Date   TRIG 166.0 (H) 11/04/2016   Lab Results  Component Value Date   CHOLHDL 3 11/04/2016   Lab Results  Component Value Date   HGBA1C 6.1 11/04/2016         Assessment & Plan:   Problem List Items Addressed This Visit    Cough variant  asthma  vs UACS from ACEi     Much better and will hold off on pulmonology appt for now      Breast cancer in female [facility])    Left breast increasingly uncomfortable and patient senses a lesion that is increasing in size.  Radiology recommends MRI of breast secondary to history and persistence of symptoms. Will proceed with MRI if insurance will improve. Pain occurs in breast to back daily      Relevant Orders   MR BREAST LEFT WO CONTRAST   Hyperlipidemia    Encouraged heart healthy diet, increase exercise, avoid trans fats, consider a krill oil cap daily      Relevant Medications   lisinopril (PRINIVIL,ZESTRIL) 20 MG tablet   Other Relevant Orders   Lipid panel   Essential hypertension    Well controlled, no changes to meds. patient did switch back to lisinopril because losartan did not control the BP and the cough did not improve off of Lisinopril. is now improved with time. Likely patient cough from Bronchitis she picked up in [place] combined with her history of asthma Check labs      Relevant Medications   lisinopril (PRINIVIL,ZESTRIL) 20 MG tablet   Other Relevant Orders   CBC with Differential/Platelet   TSH   Sedimentation rate   Hypothyroid    On Levothyroxine, continue to monitor      Vitamin D deficiency    Check level today      Hyperglycemia    hgba1c acceptable, minimize simple carbs. Increase exercise as tolerated. Continue current meds      Relevant Orders   Hemoglobin A1c   Left leg pain    Pain starts at anterior hip and hurts with ambulation the most. OK when sitting or lying. H/o distant pain in this leg which had improved then about 2 weeks pain escalated. No trauma. Will perform xray and set up with sports medicine. Start Tylenol/Acetaminophen ES 500 mg tabs, 1-2 tabs twice daily      Relevant Orders   DG HIP UNILAT W OR W/O PELVIS 2-3 VIEWS LEFT   Ambulatory referral to Sports Medicine   Neck pain    Encouraged moist heat and gentle stretching as tolerated. May try NSAIDs and prescription meds as directed and report if symptoms worsen or seek immediate care.       RLQ discomfort    Describes a stabbing pain in RLQ about 20 or 30 minutes after eating and lasts  roughly 20-30 minutes before subsiding happens after most meals. No fevers, no bloody or tarry stool. Moving bowels normally 2-3 x daily, formed. Abdominal xray      Relevant Orders   DG Abd 2 Views   Urinalysis   Urine Culture    Other Visit Diagnoses    Pain of left hip joint    -  Primary   Relevant Orders   DG HIP UNILAT W OR W/O PELVIS 2-3 VIEWS LEFT   Ambulatory referral to Sports Medicine   Breast pain       Relevant Orders   MR BREAST LEFT WO CONTRAST   Breast mass in female       Relevant Orders   MR BREAST LEFT WO CONTRAST   Mass of breast       Relevant Orders   MR BREAST LEFT WO CONTRAST      I have discontinued Ms. Oler's losartan. I have also changed her lisinopril. Additionally, I am having her maintain her Multiple Vitamins-Minerals (MULTIVITAMIN  ADULT PO), Collagen-Vitamin C (COLLAGEN PLUS VITAMIN C PO), aspirin, Vitamin D (Ergocalciferol), levothyroxine, beclomethasone, citalopram, albuterol, hyoscyamine, and metoprolol succinate.  Meds ordered this encounter  Medications  . lisinopril (PRINIVIL,ZESTRIL) 20 MG tablet    Sig: Take 1 tablet (20 mg total) by mouth daily.    Dispense:  30 tablet    Refill:  3    CMA served as scribe during this visit. History, Physical and Plan performed by medical provider. Documentation and orders reviewed and attested to.  Penni Homans, MD

## 2017-03-01 NOTE — Assessment & Plan Note (Signed)
Left breast increasingly uncomfortable and patient senses a lesion that is increasing in size. Radiology recommends MRI of breast secondary to history and persistence of symptoms. Will proceed with MRI if insurance will improve. Pain occurs in breast to back daily

## 2017-03-01 NOTE — Assessment & Plan Note (Signed)
Pain starts at anterior hip and hurts with ambulation the most. OK when sitting or lying. H/o distant pain in this leg which had improved then about 2 weeks pain escalated. No trauma. Will perform xray and set up with sports medicine. Start Tylenol/Acetaminophen ES 500 mg tabs, 1-2 tabs twice daily

## 2017-03-01 NOTE — Assessment & Plan Note (Signed)
Check level today 

## 2017-03-01 NOTE — Assessment & Plan Note (Signed)
Much better and will hold off on pulmonology appt for now

## 2017-03-01 NOTE — Patient Instructions (Addendum)
Tylenol/Acetaminophen ES 500 mg tabs 1-2 tabs by mouth twice daily. Try Lidocaine gel or patches. Salon Pas, Icy Hot and Aspercreme for the hip Hip Pain The hip is the joint between the upper legs and the lower pelvis. The bones, cartilage, tendons, and muscles of your hip joint support your body and allow you to move around. Hip pain can range from a minor ache to severe pain in one or both of your hips. The pain may be felt on the inside of the hip joint near the groin, or the outside near the buttocks and upper thigh. You may also have swelling or stiffness. Follow these instructions at home: Managing pain, stiffness, and swelling  If directed, apply ice to the injured area. ? Put ice in a plastic bag. ? Place a towel between your skin and the bag. ? Leave the ice on for 20 minutes, 2-3 times a day  Sleep with a pillow between your legs on your most comfortable side.  Avoid any activities that cause pain. General instructions  Take over-the-counter and prescription medicines only as told by your health care provider.  Do any exercises as told by your health care provider.  Record the following: ? How often you have hip pain. ? The location of your pain. ? What the pain feels like. ? What makes the pain worse.  Keep all follow-up visits as told by your health care provider. This is important. Contact a health care provider if:  You cannot put weight on your leg.  Your pain or swelling continues or gets worse after one week.  It gets harder to walk.  You have a fever. Get help right away if:  You fall.  You have a sudden increase in pain and swelling in your hip.  Your hip is red or swollen or very tender to touch. Summary  Hip pain can range from a minor ache to severe pain in one or both of your hips.  The pain may be felt on the inside of the hip joint near the groin, or the outside near the buttocks and upper thigh.  Avoid any activities that cause  pain.  Record how often you have hip pain, the location of the pain, what makes it worse and what it feels like. This information is not intended to replace advice given to you by your health care provider. Make sure you discuss any questions you have with your health care provider. Document Released: 10/28/2009 Document Revised: 04/12/2016 Document Reviewed: 04/12/2016 Elsevier Interactive Patient Education  2017 [employer].

## 2017-03-02 ENCOUNTER — Ambulatory Visit (INDEPENDENT_AMBULATORY_CARE_PROVIDER_SITE_OTHER): Payer: Medicare HMO | Admitting: Family Medicine

## 2017-03-02 ENCOUNTER — Encounter: Payer: Self-pay | Admitting: Family Medicine

## 2017-03-02 DIAGNOSIS — M79605 Pain in left leg: Secondary | ICD-10-CM | POA: Diagnosis not present

## 2017-03-02 LAB — URINE CULTURE
MICRO NUMBER: 81123148
RESULT: NO GROWTH
SPECIMEN QUALITY:: ADEQUATE

## 2017-03-02 NOTE — Patient Instructions (Signed)
Your hip exam is very reassuring. You are getting intermittent radiculopathy (irritated nerve) on your low back along with hip external rotator strain. Ok to take tylenol for baseline pain relief (1-2 extra strength tabs 3x/day) Continue with the salon pas patches. Aleve 2 tabs twice a day with food for pain and inflammation as needed. Stay as active as possible. Do home exercises and stretches as directed - hold each for 20-30 seconds and do each one three times. Consider physical therapy if not improving. Strengthening of low back muscles, abdominal musculature are key for long term pain relief. Follow up with me in 5-6 weeks or as needed if you're doing well.

## 2017-03-02 NOTE — Assessment & Plan Note (Signed)
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  She does describe radicular symptoms though none currently.  Exam is reassuring - no evidence hip arthritis is bothering her now.  Discussed options - she declined physical therapy for now - will start home exercise program which was reviewed.  Continue salon pas, tylenol if needed.  Consider aleve if needed.  F/u in 5-6 weeks.

## 2017-03-02 NOTE — Progress Notes (Signed)
PCP: [name], MD  Subjective:   HPI: Patient is a 76 y.o. female here for left hip pain.  Patient reports she's had pain in left groin/hip for about 3 years. Gets pain with walking, standing. Can radiate down her left leg to foot. Pain can get up to 10/10 and sharp at times. Tried heat and salon pas which both help. No injury or trauma - recovered fully from her injury last visit before this started again. Has had some lower abdominal pain as well. No low back pain currently. No bowel/bladder dysfunction.  Past Medical History:  Diagnosis Date  . Asthma   . Breast cancer ([place])    2002, left, encapsulated, microcalcifications. lumpectomy, radtiation x 30  . Breast cancer in female [facility])   . Change in mole 06/08/2016  . Colitis   . Decreased visual acuity 01/16/2017  . Dyspnea 11/04/2016  . Dysuria 11/04/2016  . Gluten intolerance   . History of chicken pox   . History of Helicobacter pylori infection 08/01/2015  . Hyperlipidemia   . Hypertension   . Hypothyroid   . IBS (irritable bowel syndrome)   . Left leg pain 03/01/2017  . Low back pain 08/10/2015  . Neck pain 03/01/2017  . Osteoporosis   . RLQ discomfort 03/01/2017  . Stomach cramps   . Urinary tract infection 11/04/2016    Current Outpatient Prescriptions on File Prior to Visit  Medication Sig Dispense Refill  . albuterol (PROVENTIL HFA;VENTOLIN HFA) 108 (90 Base) MCG/ACT inhaler Inhale 2 puffs into the lungs every 4 (four) hours as needed for wheezing or shortness of breath.    Marland Kitchen aspirin 81 MG tablet Take 81 mg by mouth daily.    . beclomethasone (QVAR REDIHALER) 80 MCG/ACT inhaler Inhale 2 puffs into the lungs 2 (two) times daily. 1 Inhaler 0  . citalopram (CELEXA) 20 MG tablet Take 1 tablet (20 mg total) by mouth daily. 90 tablet 1  . Collagen-Vitamin C (COLLAGEN PLUS VITAMIN C PO) Take 1 tablet by mouth daily.    . hyoscyamine (LEVSIN SL) 0.125 MG SL tablet Place 0.125 mg under the tongue every 4 (four) hours  as needed for cramping.    Marland Kitchen levothyroxine (SYNTHROID, LEVOTHROID) 50 MCG tablet TAKE ONE TABLET BY MOUTH ONCE DAILY 90 tablet 1  . lisinopril (PRINIVIL,ZESTRIL) 20 MG tablet Take 1 tablet (20 mg total) by mouth daily. 30 tablet 3  . metoprolol succinate (TOPROL XL) 25 MG 24 hr tablet Take 0.5 tablets (12.5 mg total) by mouth daily. 45 tablet 3  . Multiple Vitamins-Minerals (MULTIVITAMIN ADULT PO) Take 1 tablet by mouth daily.    . Vitamin D, Ergocalciferol, (DRISDOL) 50000 units CAPS capsule Take 1 capsule (50,000 Units total) by mouth every 7 (seven) days. 12 capsule 1   No current facility-administered medications on file prior to visit.     Past Surgical History:  Procedure Laterality Date  . ABDOMINAL HYSTERECTOMY     partial  . APPENDECTOMY    . BREAST LUMPECTOMY Left 2002  . CHOLECYSTECTOMY  Age 18 or 34  . COLONOSCOPY  2017  . FRACTURE SURGERY    . TONSILLECTOMY      No Known Allergies  Social History   Social History  . Marital status: Divorced    Spouse name: N/A  . Number of children: N/A  . Years of education: N/A   Occupational History  . retired     Pharmacist, hospital, kindergarten   Social History Main Topics  . Smoking  status: Never Smoker  . Smokeless tobacco: Never Used  . Alcohol use No  . Drug use: No  . Sexual activity: No     Comment: lives alone, avoids daiiry and gluten. 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 Not on file   Social History Narrative  . No narrative on file    Family History  Problem Relation Age of Onset  . Colitis Mother   . Irritable bowel syndrome Mother   . Hypertension Father   . Hyperlipidemia Brother   . Heart attack Brother   . Stomach cancer Paternal Grandmother 81  . Hyperlipidemia Brother   . Heart attack Brother   . Hyperlipidemia Brother   . Heart attack Brother   . Hyperlipidemia Sister   . Leukemia Daughter   . Arthritis Daughter   . Heart disease Daughter        ASD vs VSD    BP 118/69    Pulse (!) 50   Ht 4\' 11"  (1.499 m)   Wt 124 lb (56.2 kg)   BMI 25.04 kg/m   Review of Systems: See HPI above.     Objective:  Physical Exam:  Gen: NAD, comfortable in exam room  Back/left hip: No gross deformity, scoliosis. TTP left side over hip external rotators, piriformis.  No midline or bony TTP. FROM without reproduction of pain. Strength LEs 5/5 all muscle groups.   2+ MSRs in patellar and achilles tendons, equal bilaterally. Negative SLRs. Sensation intact to light touch bilaterally. Negative logroll bilateral hips. Negative fabers and piriformis stretches.  Assessment & Plan:  1. 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  She does describe radicular symptoms though none currently.  Exam is reassuring - no evidence hip arthritis is bothering her now.  Discussed options - she declined physical therapy for now - will start home exercise program which was reviewed.  Continue salon pas, tylenol if needed.  Consider aleve if needed.  F/u in 5-6 weeks.

## 2017-03-23 ENCOUNTER — Telehealth: Payer: Self-pay | Admitting: Family Medicine

## 2017-03-23 DIAGNOSIS — N644 Mastodynia: Secondary | ICD-10-CM

## 2017-03-23 NOTE — Telephone Encounter (Signed)
Julie Wilkerson at [facility]. Julie Wilkerson states order needs changed in Epic from Dr Charlett Blake. Original order: MRI Breast Left without contracst. CHANGE TO:  MRI Breast Bilateral with and without contrast.

## 2017-03-24 DIAGNOSIS — 419620001 Death: Secondary | SNOMED CT | POA: Diagnosis not present

## 2017-03-24 DEATH — deceased

## 2017-03-25 NOTE — Telephone Encounter (Signed)
New orders placed

## 2017-03-28 ENCOUNTER — Telehealth: Payer: Self-pay | Admitting: *Deleted

## 2017-03-28 ENCOUNTER — Ambulatory Visit (HOSPITAL_BASED_OUTPATIENT_CLINIC_OR_DEPARTMENT_OTHER)
Admission: RE | Admit: 2017-03-28 | Discharge: 2017-03-28 | Disposition: A | Discharge status: Home / Self Care | Payer: Medicare HMO | Source: Ambulatory Visit | Attending: Family Medicine | Admitting: Family Medicine

## 2017-03-28 ENCOUNTER — Ambulatory Visit (INDEPENDENT_AMBULATORY_CARE_PROVIDER_SITE_OTHER): Payer: Medicare HMO | Admitting: Family Medicine

## 2017-03-28 ENCOUNTER — Encounter: Payer: Self-pay | Admitting: Family Medicine

## 2017-03-28 VITALS — BP 118/62 | HR 52 | Temp 98.2°F | Resp 18 | Wt 125.8 lb

## 2017-03-28 DIAGNOSIS — M4312 Spondylolisthesis, cervical region: Secondary | ICD-10-CM | POA: Diagnosis not present

## 2017-03-28 DIAGNOSIS — C50919 Malignant neoplasm of unspecified site of unspecified female breast: Secondary | ICD-10-CM

## 2017-03-28 DIAGNOSIS — M47812 Spondylosis without myelopathy or radiculopathy, cervical region: Secondary | ICD-10-CM | POA: Insufficient documentation

## 2017-03-28 DIAGNOSIS — R51 Headache: Secondary | ICD-10-CM | POA: Diagnosis not present

## 2017-03-28 DIAGNOSIS — R413 Other amnesia: Secondary | ICD-10-CM

## 2017-03-28 DIAGNOSIS — M5030 Other cervical disc degeneration, unspecified cervical region: Secondary | ICD-10-CM | POA: Insufficient documentation

## 2017-03-28 DIAGNOSIS — R519 Headache, unspecified: Secondary | ICD-10-CM | POA: Insufficient documentation

## 2017-03-28 DIAGNOSIS — M542 Cervicalgia: Secondary | ICD-10-CM

## 2017-03-28 DIAGNOSIS — R0989 Other specified symptoms and signs involving the circulatory and respiratory systems: Secondary | ICD-10-CM

## 2017-03-28 DIAGNOSIS — I10 Essential (primary) hypertension: Secondary | ICD-10-CM

## 2017-03-28 HISTORY — DX: Other specified symptoms and signs involving the circulatory and respiratory systems: R09.89

## 2017-03-28 NOTE — Assessment & Plan Note (Signed)
History of and now with increased pain and swelling in left breast. MGM and [name] nondiagnostic recenlty. MRI of left breast has been approved. She will proceed.

## 2017-03-28 NOTE — Progress Notes (Signed)
Subjective:  I acted as a Education administrator for Dr. Charlett Blake. [name], [place]  Patient ID: Julie Wilkerson, female    DOB: 1940/08/10, 76 y.o.   MRN: [id number]  No chief complaint on file.   HPI  Patient is in today for an acute visit for headaches and confusion and anxiety. She is noting that even simple activity can make her feel anxious and disoriented at times. She has frequent headaches recently as well. No falls or injury. No fevers or chills. She still has discomfort and swelling in left breast but has not had MRI yet. Denies CP/palp/SOB/HA/congestion/fevers/GI or GU c/o. Taking meds as prescribed  Patient Care Team: [name], MD as PCP - General (Family Medicine) Lake Bells., MD as Consulting Physician (Gastroenterology) Belva Bertin, Armida Sans as Physician Assistant (Family Medicine)   Past Medical History:  Diagnosis Date  . Asthma   . Breast cancer ([place])    2002, left, encapsulated, microcalcifications. lumpectomy, radtiation x 30  . Breast cancer in female [facility])   . Change in mole 06/08/2016  . Colitis   . Decreased visual acuity 01/16/2017  . Dyspnea 11/04/2016  . Dysuria 11/04/2016  . Gluten intolerance   . History of chicken pox   . History of Helicobacter pylori infection 08/01/2015  . Hyperlipidemia   . Hypertension   . Hypothyroid   . IBS (irritable bowel syndrome)   . Left leg pain 03/01/2017  . Low back pain 08/10/2015  . Neck pain 03/01/2017  . Osteoporosis   . RLQ discomfort 03/01/2017  . Stomach cramps   . Urinary tract infection 11/04/2016    Past Surgical History:  Procedure Laterality Date  . ABDOMINAL HYSTERECTOMY     partial  . APPENDECTOMY    . BREAST LUMPECTOMY Left 2002  . CHOLECYSTECTOMY  Age 4 or 75  . COLONOSCOPY  2017  . FRACTURE SURGERY    . TONSILLECTOMY      Family History  Problem Relation Age of Onset  . Colitis Mother   . Irritable bowel syndrome Mother   . Hypertension Father   . Hyperlipidemia Brother   . Heart attack Brother   .  Stomach cancer Paternal Grandmother 81  . Hyperlipidemia Brother   . Heart attack Brother   . Hyperlipidemia Brother   . Heart attack Brother   . Hyperlipidemia Sister   . Leukemia Daughter   . Arthritis Daughter   . Heart disease Daughter        ASD vs VSD    Social History   Socioeconomic History  . Marital status: Single    Spouse name: Not on file  . Number of children: Not on file  . Years of education: Not on file  . Highest education level: Not on file  Social Needs  . Financial resource strain: Not on file  . Food insecurity - worry: Not on file  . Food insecurity - inability: Not on file  . Transportation needs - medical: Not on file  . Transportation needs - non-medical: Not on file  Occupational History  . Occupation: retired    Comment: Pharmacist, hospital, kindergarten  Tobacco Use  . Smoking status: Never Smoker  . Smokeless tobacco: Never Used  Substance and Sexual Activity  . Alcohol use: No    Alcohol/week: 0.0 oz  . Drug use: No  . Sexual activity: No    Comment: lives alone, avoids daiiry and gluten. 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 Not on file  Social  History Narrative  . Not on file    Outpatient Medications Prior to Visit  Medication Sig Dispense Refill  . albuterol (PROVENTIL HFA;VENTOLIN HFA) 108 (90 Base) MCG/ACT inhaler Inhale 2 puffs into the lungs every 4 (four) hours as needed for wheezing or shortness of breath.    Marland Kitchen aspirin 81 MG tablet Take 81 mg by mouth daily.    . beclomethasone (QVAR REDIHALER) 80 MCG/ACT inhaler Inhale 2 puffs into the lungs 2 (two) times daily. 1 Inhaler 0  . citalopram (CELEXA) 20 MG tablet Take 1 tablet (20 mg total) by mouth daily. 90 tablet 1  . Collagen-Vitamin C (COLLAGEN PLUS VITAMIN C PO) Take 1 tablet by mouth daily.    . hyoscyamine (LEVSIN SL) 0.125 MG SL tablet Place 0.125 mg under the tongue every 4 (four) hours as needed for cramping.    Marland Kitchen levothyroxine (SYNTHROID, LEVOTHROID) 50 MCG tablet  TAKE ONE TABLET BY MOUTH ONCE DAILY 90 tablet 1  . lisinopril (PRINIVIL,ZESTRIL) 20 MG tablet Take 1 tablet (20 mg total) by mouth daily. 30 tablet 3  . metoprolol succinate (TOPROL XL) 25 MG 24 hr tablet Take 0.5 tablets (12.5 mg total) by mouth daily. 45 tablet 3  . Multiple Vitamins-Minerals (MULTIVITAMIN ADULT PO) Take 1 tablet by mouth daily.    . Vitamin D, Ergocalciferol, (DRISDOL) 50000 units CAPS capsule Take 1 capsule (50,000 Units total) by mouth every 7 (seven) days. 12 capsule 1   No facility-administered medications prior to visit.     No Known Allergies  Review of Systems  Constitutional: Negative for fever and malaise/fatigue.  HENT: Negative for congestion.   Eyes: Negative for blurred vision.  Respiratory: Negative for cough and shortness of breath.   Cardiovascular: Negative for chest pain, palpitations and leg swelling.  Gastrointestinal: Negative for vomiting.  Musculoskeletal: Negative for back pain.  Skin: Negative for rash.  Neurological: Positive for dizziness, tingling and headaches. Negative for loss of consciousness.  Psychiatric/Behavioral: Positive for memory loss. The patient is nervous/anxious.        Objective:    Physical Exam  Constitutional: She is oriented to person, place, and time. She appears well-developed and well-nourished. No distress.  HENT:  Head: Normocephalic and atraumatic.  Eyes: Conjunctivae are normal.  Neck: Normal range of motion. No thyromegaly present.  Cardiovascular: Normal rate and regular rhythm.  Pulmonary/Chest: Effort normal and breath sounds normal. She has no wheezes.  Abdominal: Soft. Bowel sounds are normal. There is no tenderness.  Musculoskeletal: Normal range of motion. She exhibits no edema or deformity.  Neurological: She is alert and oriented to person, place, and time.  Skin: Skin is warm and dry. She is not diaphoretic.  Psychiatric: She has a normal mood and affect.    BP 118/62 (BP Location: Left  Arm, Patient Position: Sitting, Cuff Size: Normal)   Pulse (!) 52   Temp 98.2 F (36.8 C) (Oral)   Resp 18   Wt 125 lb 12.8 oz (57.1 kg)   SpO2 96%   BMI 25.41 kg/m  Wt Readings from Last 3 Encounters:  03/28/17 125 lb 12.8 oz (57.1 kg)  03/02/17 124 lb (56.2 kg)  03/01/17 125 lb 9.6 oz (57 kg)   BP Readings from Last 3 Encounters:  03/28/17 118/62  03/02/17 118/69  03/01/17 (!) 110/48     Immunization History  Administered Date(s) Administered  . Influenza, High Dose Seasonal PF 03/11/2015, 02/05/2016, 02/05/2017  . Influenza,inj,quad, With Preservative 05/23/2014  . Pneumococcal Polysaccharide-23 05/23/2014  .  Td 02/05/2016  . Zoster 05/24/2009    Health Maintenance  Topic Date Due  . PNA vac Low Risk Adult (2 of 2 - PCV13) 05/24/2015  . COLONOSCOPY  08/04/2025  . TETANUS/TDAP  02/04/2026  . INFLUENZA VACCINE  Completed  . DEXA SCAN  Completed    Lab Results  Component Value Date   WBC 4.7 03/01/2017   HGB 11.8 (L) 03/01/2017   HCT 35.8 (L) 03/01/2017   PLT 218.0 03/01/2017   GLUCOSE 109 (H) 12/07/2016   CHOL 236 (H) 03/01/2017   TRIG 116.0 03/01/2017   HDL 72.70 03/01/2017   LDLCALC 140 (H) 03/01/2017   ALT 14 12/07/2016   AST 21 12/07/2016   NA 135 12/07/2016   K 4.3 12/07/2016   CL 103 12/07/2016   CREATININE 1.05 12/07/2016   BUN 31 (H) 12/07/2016   CO2 26 12/07/2016   TSH 2.42 03/01/2017   HGBA1C 6.0 03/01/2017    Lab Results  Component Value Date   TSH 2.42 03/01/2017   Lab Results  Component Value Date   WBC 4.7 03/01/2017   HGB 11.8 (L) 03/01/2017   HCT 35.8 (L) 03/01/2017   MCV 91.3 03/01/2017   PLT 218.0 03/01/2017   Lab Results  Component Value Date   NA 135 12/07/2016   K 4.3 12/07/2016   CO2 26 12/07/2016   GLUCOSE 109 (H) 12/07/2016   BUN 31 (H) 12/07/2016   CREATININE 1.05 12/07/2016   BILITOT 0.6 12/07/2016   ALKPHOS 49 12/07/2016   AST 21 12/07/2016   ALT 14 12/07/2016   PROT 7.1 12/07/2016   ALBUMIN 4.2  12/07/2016   CALCIUM 9.2 12/07/2016   GFR 54.30 (L) 12/07/2016   Lab Results  Component Value Date   CHOL 236 (H) 03/01/2017   Lab Results  Component Value Date   HDL 72.70 03/01/2017   Lab Results  Component Value Date   LDLCALC 140 (H) 03/01/2017   Lab Results  Component Value Date   TRIG 116.0 03/01/2017   Lab Results  Component Value Date   CHOLHDL 3 03/01/2017   Lab Results  Component Value Date   HGBA1C 6.0 03/01/2017         Assessment & Plan:   Problem List Items Addressed This Visit    Breast cancer in female [facility])    History of and now with increased pain and swelling in left breast. MGM and [name] nondiagnostic recenlty. MRI of left breast has been approved. She will proceed.       Essential hypertension    Well controlled, no changes to meds. Encouraged heart healthy diet such as the DASH diet and exercise as tolerated.       Neck pain    Encouraged moist heat and gentle stretching as tolerated. May try NSAIDs and prescription meds as directed and report if symptoms worsen or seek immediate care. Check cervical xray      Memory loss   Relevant Orders   CT Head Wo Contrast   Ambulatory referral to Neurology   Bilateral carotid bruits    Proceed with carotid ultrasound.       Relevant Orders   US Carotid Bilateral   Nonintractable headache - Primary    Worsening with some memory loss. Will proceed with CT head.       Relevant Orders   CT Head Wo Contrast   Ambulatory referral to Neurology    Other Visit Diagnoses    Neck pain on left side  Relevant Orders   DG Cervical Spine Complete      I am having Jamison Neighbor maintain her Multiple Vitamins-Minerals (MULTIVITAMIN ADULT PO), Collagen-Vitamin C (COLLAGEN PLUS VITAMIN C PO), aspirin, Vitamin D (Ergocalciferol), levothyroxine, beclomethasone, citalopram, albuterol, hyoscyamine, metoprolol succinate, and lisinopril.  No orders of the defined types were placed in this  encounter.   CMA served as Education administrator during this visit. History, Physical and Plan performed by medical provider. Documentation and orders reviewed and attested to.  Penni Homans, MD

## 2017-03-28 NOTE — Patient Instructions (Signed)
Carotid Artery Disease The carotid arteries are arteries on both sides of the neck. They carry blood to the brain. Carotid artery disease is when the arteries get smaller (narrow) or get blocked. If these arteries get smaller or get blocked, you are more likely to have a stroke or warning stroke (transient ischemic attack). Follow these instructions at home:  Take medicines as told by your doctor. Make sure you understand all your medicine instructions. Do not stop your medicines without talking to your doctor first.  Follow your doctor's diet instructions. It is important to eat a healthy diet that includes plenty of: ? Fresh fruits. ? Vegetables. ? Lean meats.  Avoid: ? High-fat foods. ? High-sodium foods. ? Foods that are fried, overly processed, or have poor nutritional value.  Stay a healthy weight.  Stay active. Get at least 30 minutes of activity every day.  Do not smoke.  Limit alcohol use to: ? No more than 2 drinks a day for men. ? No more than 1 drink a day for women who are not pregnant.  Do not use illegal drugs.  Keep all doctor visits as told. Get help right away if:  You have sudden weakness or loss of feeling (numbness) on one side of the body, such as the face, arm, or leg.  You have sudden confusion.  You have trouble speaking (aphasia) or understanding.  You have sudden trouble seeing out of one or both eyes.  You have sudden trouble walking.  You have dizziness or feel like you might pass out (faint).  You have a loss of balance or your movements are not steady (uncoordinated).  You have a sudden, severe headache with no known cause.  You have trouble swallowing (dysphagia). Call your local emergency services (911 in [place] Do notdrive yourself to the clinic or hospital. This information is not intended to replace advice given to you by your health care provider. Make sure you discuss any questions you have with your health care  provider. Document Released: 04/26/2012 Document Revised: 10/16/2015 Document Reviewed: 11/08/2012 Elsevier Interactive Patient Education  2018 Elsevier Inc.  

## 2017-03-28 NOTE — Assessment & Plan Note (Signed)
Well controlled, no changes to meds. Encouraged heart healthy diet such as the DASH diet and exercise as tolerated.  °

## 2017-03-28 NOTE — Assessment & Plan Note (Signed)
Proceed with carotid ultrasound.

## 2017-03-28 NOTE — Telephone Encounter (Signed)
Received request for Medical Records from [facility]; forwarded to [place] for email/scana/SLS 11/05

## 2017-03-28 NOTE — Assessment & Plan Note (Signed)
Worsening with some memory loss. Will proceed with CT head.

## 2017-03-28 NOTE — Assessment & Plan Note (Signed)
Encouraged moist heat and gentle stretching as tolerated. May try NSAIDs and prescription meds as directed and report if symptoms worsen or seek immediate care. Check cervical xray

## 2017-03-29 ENCOUNTER — Ambulatory Visit (HOSPITAL_BASED_OUTPATIENT_CLINIC_OR_DEPARTMENT_OTHER)
Admission: RE | Admit: 2017-03-29 | Discharge: 2017-03-29 | Disposition: A | Discharge status: Home / Self Care | Payer: Medicare HMO | Source: Ambulatory Visit | Attending: Family Medicine | Admitting: Family Medicine

## 2017-03-29 DIAGNOSIS — R413 Other amnesia: Secondary | ICD-10-CM

## 2017-03-29 DIAGNOSIS — R0989 Other specified symptoms and signs involving the circulatory and respiratory systems: Secondary | ICD-10-CM

## 2017-03-29 DIAGNOSIS — R51 Headache: Secondary | ICD-10-CM | POA: Insufficient documentation

## 2017-03-29 DIAGNOSIS — R519 Headache, unspecified: Secondary | ICD-10-CM

## 2017-03-29 DIAGNOSIS — I6523 Occlusion and stenosis of bilateral carotid arteries: Secondary | ICD-10-CM | POA: Diagnosis not present

## 2017-04-25 ENCOUNTER — Telehealth: Payer: Self-pay | Admitting: Family Medicine

## 2017-04-25 NOTE — Telephone Encounter (Signed)
Needs to have her practitioner for memory loss changed

## 2017-04-26 ENCOUNTER — Ambulatory Visit (INDEPENDENT_AMBULATORY_CARE_PROVIDER_SITE_OTHER): Payer: Medicare HMO | Admitting: Family Medicine

## 2017-04-26 ENCOUNTER — Encounter: Payer: Self-pay | Admitting: Family Medicine

## 2017-04-26 ENCOUNTER — Ambulatory Visit: Payer: Self-pay | Admitting: *Deleted

## 2017-04-26 ENCOUNTER — Ambulatory Visit: Payer: Medicare HMO | Admitting: Cardiovascular Disease

## 2017-04-26 VITALS — BP 128/60 | HR 60 | Temp 98.0°F | Ht 59.0 in | Wt 124.0 lb

## 2017-04-26 DIAGNOSIS — J40 Bronchitis, not specified as acute or chronic: Secondary | ICD-10-CM | POA: Insufficient documentation

## 2017-04-26 DIAGNOSIS — R05 Cough: Secondary | ICD-10-CM

## 2017-04-26 DIAGNOSIS — R059 Cough, unspecified: Secondary | ICD-10-CM

## 2017-04-26 DIAGNOSIS — J0140 Acute pansinusitis, unspecified: Secondary | ICD-10-CM

## 2017-04-26 HISTORY — DX: Acute pansinusitis, unspecified: J01.40

## 2017-04-26 LAB — POCT INFLUENZA A/B
Influenza A, POC: NEGATIVE
Influenza B, POC: NEGATIVE

## 2017-04-26 MED ORDER — METHYLPREDNISOLONE ACETATE 80 MG/ML IJ SUSP
80.0000 mg | Freq: Once | INTRAMUSCULAR | Status: AC
  Administered 2017-04-26: 80 mg via INTRAMUSCULAR

## 2017-04-26 MED ORDER — PREDNISONE 10 MG PO TABS: ORAL_TABLET | ORAL | 0 refills | Status: DC

## 2017-04-26 MED ORDER — ALBUTEROL SULFATE (2.5 MG/3ML) 0.083% IN NEBU: 2.5000 mg | INHALATION_SOLUTION | Freq: Four times a day (QID) | RESPIRATORY_TRACT | 1 refills | Status: DC | PRN

## 2017-04-26 MED ORDER — PROMETHAZINE-DM 6.25-15 MG/5ML PO SYRP: 5.0000 mL | ORAL_SOLUTION | Freq: Four times a day (QID) | ORAL | 0 refills | Status: DC | PRN

## 2017-04-26 NOTE — Telephone Encounter (Signed)
Noted  

## 2017-04-26 NOTE — Telephone Encounter (Signed)
Pt called complaining of cough. Which keeps her from sleeping, headache, and trouble breathing; she states her nebulizer is not working; pt states that her daughter is a PA and wrote her a prescription for an antbiotic but it is not working; pt does have a history of asthma; pt also complains of right ear pain; sinus pain and pressure; she says that she is coughing up green phlegm; pt says that she has periods of being cold and then sweating but she has not taken her her temperature; pt does not use oxygen; pt states that she has also been using cough syrup; she prefers to be seen in MD's office; pt instructed to call EMS if symptoms worsen but has accepted an 1115 appointment with Dr Etter Sjogren; she  Verbalizes understanding; mwill route to [place] pool for notification of this upcoming appt    Reason for Disposition . [1] Continuous (nonstop) coughing AND [2] keeps from working or sleeping  Answer Assessment - Initial Assessment Questions 1. RESPIRATORY STATUS: "Describe your breathing?" (e.g., wheezing, shortness of breath, unable to speak, severe coughing)      Pt states she has occasional wheezing and shortness of breath 2. ONSET: "When did this breathing problem begin?"      Saturday 04/23/17 3. PATTERN "Does the difficult breathing come and go, or has it been constant since it started?"      Come and goes 4. SEVERITY: "How bad is your breathing?" (e.g., mild, moderate, severe)    - MILD: No SOB at rest, mild SOB with walking, speaks normally in sentences, can lay down, no retractions, pulse < 100.    - MODERATE: SOB at rest, SOB with minimal exertion and prefers to sit, cannot lie down flat, speaks in phrases, mild retractions, audible wheezing, pulse 100-120.    - SEVERE: Very SOB at rest, speaks in single words, struggling to breathe, sitting hunched forward, retractions, pulse > 120      moderate 5. RECURRENT SYMPTOM: "Have you had difficulty breathing before?" If so, ask: "When was the  last time?" and "What happened that time?"      Numerous times 6. CARDIAC HISTORY: "Do you have any history of heart disease?" (e.g., heart attack, angina, bypass surgery, angioplasty)      no 7. LUNG HISTORY: "Do you have any history of lung disease?"  (e.g., pulmonary embolus, asthma, emphysema)     Yes asthma 8. CAUSE: "What do you think is causing the breathing problem?"      Not sure 9. OTHER SYMPTOMS: "Do you have any other symptoms? (e.g., dizziness, runny nose, cough, chest pain, fever)     Cough, runny nose 10. PREGNANCY: "Is there any chance you are pregnant?" "When was your last menstrual period?"       no 11. TRAVEL: "Have you traveled out of the country in the last month?" (e.g., travel history, exposures)       no  Protocols used: BREATHING DIFFICULTY-A-AH

## 2017-04-26 NOTE — Patient Instructions (Signed)

## 2017-04-26 NOTE — Assessment & Plan Note (Signed)
If no improvement in 3-4 days-- check xray

## 2017-04-26 NOTE — Progress Notes (Signed)
Subjective:  I acted as a Education administrator for [employer], [place]   Patient ID: Julie Wilkerson, female    DOB: 1940-09-25, 76 y.o.   MRN: [id number]  Chief Complaint  Patient presents with  . Sinusitis    Onset about 1 week ago   . Influenza    Onset about 3 weeks ago    HPI  Patient is in today for flulike symptoms -- she has had sinus pressure,  Congestion and wheezing.  She is using the neb 3x a day.  + body aches ,  Chills.  No fevers.  Symptoms x 3 weeks  Her daughter is a PA and gave her rx for augmentin-- she started yesterday.   Coughing up green mucus.   Pt is taking mucinex for cough.    Patient Care Team: [name], MD as PCP - General (Family Medicine) Lake Bells., MD as Consulting Physician (Gastroenterology) Belva Bertin, Armida Sans as Physician Assistant (Family Medicine)   Past Medical History:  Diagnosis Date  . Asthma   . Breast cancer ([place])    2002, left, encapsulated, microcalcifications. lumpectomy, radtiation x 30  . Breast cancer in female [facility])   . Change in mole 06/08/2016  . Colitis   . Decreased visual acuity 01/16/2017  . Dyspnea 11/04/2016  . Dysuria 11/04/2016  . Gluten intolerance   . History of chicken pox   . History of Helicobacter pylori infection 08/01/2015  . Hyperlipidemia   . Hypertension   . Hypothyroid   . IBS (irritable bowel syndrome)   . Left leg pain 03/01/2017  . Low back pain 08/10/2015  . Neck pain 03/01/2017  . Osteoporosis   . RLQ discomfort 03/01/2017  . Stomach cramps   . Urinary tract infection 11/04/2016    Past Surgical History:  Procedure Laterality Date  . ABDOMINAL HYSTERECTOMY     partial  . APPENDECTOMY    . BREAST LUMPECTOMY Left 2002  . CHOLECYSTECTOMY  Age 69 or 78  . COLONOSCOPY  2017  . FRACTURE SURGERY    . TONSILLECTOMY      Family History  Problem Relation Age of Onset  . Colitis Mother   . Irritable bowel syndrome Mother   . Hypertension Father   . Hyperlipidemia Brother   . Heart attack  Brother   . Stomach cancer Paternal Grandmother 9  . Hyperlipidemia Brother   . Heart attack Brother   . Hyperlipidemia Brother   . Heart attack Brother   . Hyperlipidemia Sister   . Leukemia Daughter   . Arthritis Daughter   . Heart disease Daughter        ASD vs VSD    Social History   Socioeconomic History  . Marital status: Single    Spouse name: Not on file  . Number of children: Not on file  . Years of education: Not on file  . Highest education level: Not on file  Social Needs  . Financial resource strain: Not on file  . Food insecurity - worry: Not on file  . Food insecurity - inability: Not on file  . Transportation needs - medical: Not on file  . Transportation needs - non-medical: Not on file  Occupational History  . Occupation: retired    Comment: Pharmacist, hospital, kindergarten  Tobacco Use  . Smoking status: Never Smoker  . Smokeless tobacco: Never Used  Substance and Sexual Activity  . Alcohol use: No    Alcohol/week: 0.0 oz  . Drug use: No  .  Sexual activity: No    Comment: lives alone, avoids daiiry and gluten. 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 Not on file  Social History Narrative  . Not on file    Outpatient Medications Prior to Visit  Medication Sig Dispense Refill  . albuterol (PROVENTIL HFA;VENTOLIN HFA) 108 (90 Base) MCG/ACT inhaler Inhale 2 puffs into the lungs every 4 (four) hours as needed for wheezing or shortness of breath.    Marland Kitchen aspirin 81 MG tablet Take 81 mg by mouth daily.    . beclomethasone (QVAR REDIHALER) 80 MCG/ACT inhaler Inhale 2 puffs into the lungs 2 (two) times daily. 1 Inhaler 0  . citalopram (CELEXA) 20 MG tablet Take 1 tablet (20 mg total) by mouth daily. 90 tablet 1  . Collagen-Vitamin C (COLLAGEN PLUS VITAMIN C PO) Take 1 tablet by mouth daily.    . hyoscyamine (LEVSIN SL) 0.125 MG SL tablet Place 0.125 mg under the tongue every 4 (four) hours as needed for cramping.    Marland Kitchen levothyroxine (SYNTHROID, LEVOTHROID) 50  MCG tablet TAKE ONE TABLET BY MOUTH ONCE DAILY 90 tablet 1  . lisinopril (PRINIVIL,ZESTRIL) 20 MG tablet Take 1 tablet (20 mg total) by mouth daily. 30 tablet 3  . metoprolol succinate (TOPROL XL) 25 MG 24 hr tablet Take 0.5 tablets (12.5 mg total) by mouth daily. 45 tablet 3  . Multiple Vitamins-Minerals (MULTIVITAMIN ADULT PO) Take 1 tablet by mouth daily.    . Vitamin D, Ergocalciferol, (DRISDOL) 50000 units CAPS capsule Take 1 capsule (50,000 Units total) by mouth every 7 (seven) days. 12 capsule 1   No facility-administered medications prior to visit.     No Known Allergies  Review of Systems  Constitutional: Positive for chills. Negative for fever and malaise/fatigue.  HENT: Positive for congestion, sinus pain and sore throat. Negative for hearing loss.   Eyes: Negative for discharge.  Respiratory: Negative for cough, sputum production and shortness of breath.   Cardiovascular: Negative for chest pain, palpitations and leg swelling.  Gastrointestinal: Negative for abdominal pain, blood in stool, constipation, diarrhea, heartburn, nausea and vomiting.  Genitourinary: Negative for dysuria, frequency, hematuria and urgency.  Musculoskeletal: Positive for joint pain and myalgias. Negative for back pain and falls.  Skin: Negative for rash.  Neurological: Negative for dizziness, sensory change, loss of consciousness, weakness and headaches.  Endo/Heme/Allergies: Negative for environmental allergies. Does not bruise/bleed easily.  Psychiatric/Behavioral: Negative for depression and suicidal ideas. The patient is not nervous/anxious and does not have insomnia.        Objective:    Physical Exam  Constitutional: She is oriented to person, place, and time. She appears well-developed and well-nourished.  HENT:  Right Ear: External ear normal.  Left Ear: External ear normal.  Nose: Left sinus exhibits maxillary sinus tenderness and frontal sinus tenderness.  Mouth/Throat: Posterior  oropharyngeal erythema present.  + PND + errythema  Eyes: Conjunctivae are normal. Right eye exhibits no discharge. Left eye exhibits no discharge.  Cardiovascular: Normal rate, regular rhythm and normal heart sounds.  No murmur heard. Pulmonary/Chest: Effort normal. No respiratory distress. She has wheezes. She has no rales. She exhibits no tenderness.  Musculoskeletal: She exhibits no edema.  Lymphadenopathy:    She has cervical adenopathy.  Neurological: She is alert and oriented to person, place, and time.  Nursing note and vitals reviewed.   BP 128/60   Pulse 60   Temp 98 F (36.7 C) (Oral)   Ht 4\' 11"  (1.499 m)  Wt 124 lb (56.2 kg)   SpO2 97%   BMI 25.04 kg/m  Wt Readings from Last 3 Encounters:  04/26/17 124 lb (56.2 kg)  03/28/17 125 lb 12.8 oz (57.1 kg)  03/02/17 124 lb (56.2 kg)   BP Readings from Last 3 Encounters:  04/26/17 128/60  03/28/17 118/62  03/02/17 118/69     Immunization History  Administered Date(s) Administered  . Influenza, High Dose Seasonal PF 03/11/2015, 02/05/2016, 02/05/2017  . Influenza,inj,quad, With Preservative 05/23/2014  . Pneumococcal Polysaccharide-23 05/23/2014  . Td 02/05/2016  . Zoster 05/24/2009    Health Maintenance  Topic Date Due  . PNA vac Low Risk Adult (2 of 2 - PCV13) 05/24/2015  . COLONOSCOPY  08/04/2025  . TETANUS/TDAP  02/04/2026  . INFLUENZA VACCINE  Completed  . DEXA SCAN  Completed    Lab Results  Component Value Date   WBC 4.7 03/01/2017   HGB 11.8 (L) 03/01/2017   HCT 35.8 (L) 03/01/2017   PLT 218.0 03/01/2017   GLUCOSE 109 (H) 12/07/2016   CHOL 236 (H) 03/01/2017   TRIG 116.0 03/01/2017   HDL 72.70 03/01/2017   LDLCALC 140 (H) 03/01/2017   ALT 14 12/07/2016   AST 21 12/07/2016   NA 135 12/07/2016   K 4.3 12/07/2016   CL 103 12/07/2016   CREATININE 1.05 12/07/2016   BUN 31 (H) 12/07/2016   CO2 26 12/07/2016   TSH 2.42 03/01/2017   HGBA1C 6.0 03/01/2017    Lab Results  Component Value  Date   TSH 2.42 03/01/2017   Lab Results  Component Value Date   WBC 4.7 03/01/2017   HGB 11.8 (L) 03/01/2017   HCT 35.8 (L) 03/01/2017   MCV 91.3 03/01/2017   PLT 218.0 03/01/2017   Lab Results  Component Value Date   NA 135 12/07/2016   K 4.3 12/07/2016   CO2 26 12/07/2016   GLUCOSE 109 (H) 12/07/2016   BUN 31 (H) 12/07/2016   CREATININE 1.05 12/07/2016   BILITOT 0.6 12/07/2016   ALKPHOS 49 12/07/2016   AST 21 12/07/2016   ALT 14 12/07/2016   PROT 7.1 12/07/2016   ALBUMIN 4.2 12/07/2016   CALCIUM 9.2 12/07/2016   GFR 54.30 (L) 12/07/2016   Lab Results  Component Value Date   CHOL 236 (H) 03/01/2017   Lab Results  Component Value Date   HDL 72.70 03/01/2017   Lab Results  Component Value Date   LDLCALC 140 (H) 03/01/2017   Lab Results  Component Value Date   TRIG 116.0 03/01/2017   Lab Results  Component Value Date   CHOLHDL 3 03/01/2017   Lab Results  Component Value Date   HGBA1C 6.0 03/01/2017         Assessment & Plan:   Problem List Items Addressed This Visit      Unprioritized   Acute pansinusitis    abx -- started by her daughter yesterday-- augmentin Finish course pred taper depomedrol 80 mg im today Cough med per orders      Relevant Medications   promethazine-dextromethorphan (PROMETHAZINE-DM) 6.25-15 MG/5ML syrup   predniSONE (DELTASONE) 10 MG tablet   methylPREDNISolone acetate (DEPO-MEDROL) injection 80 mg (Completed)   Bronchitis    If no improvement in 3-4 days-- check xray       Relevant Medications   predniSONE (DELTASONE) 10 MG tablet   albuterol (PROVENTIL) (2.5 MG/3ML) 0.083% nebulizer solution   Other Relevant Orders   POCT Influenza A/B (Completed)   DG Chest 2 View  Other Visit Diagnoses    Cough    -  Primary   Relevant Medications   promethazine-dextromethorphan (PROMETHAZINE-DM) 6.25-15 MG/5ML syrup   Other Relevant Orders   DG Chest 2 View      I am having Jamison Neighbor start on  promethazine-dextromethorphan, predniSONE, and albuterol. I am also having her maintain her Multiple Vitamins-Minerals (MULTIVITAMIN ADULT PO), Collagen-Vitamin C (COLLAGEN PLUS VITAMIN C PO), aspirin, Vitamin D (Ergocalciferol), levothyroxine, beclomethasone, citalopram, albuterol, hyoscyamine, metoprolol succinate, and lisinopril. We administered methylPREDNISolone acetate.  Meds ordered this encounter  Medications  . promethazine-dextromethorphan (PROMETHAZINE-DM) 6.25-15 MG/5ML syrup    Sig: Take 5 mLs by mouth 4 (four) times daily as needed.    Dispense:  118 mL    Refill:  0  . predniSONE (DELTASONE) 10 MG tablet    Sig: TAKE 3 TABLETS PO QD FOR 3 DAYS THEN TAKE 2 TABLETS PO QD FOR 3 DAYS THEN TAKE 1 TABLET PO QD FOR 3 DAYS THEN TAKE 1/2 TAB PO QD FOR 3 DAYS    Dispense:  20 tablet    Refill:  0  . methylPREDNISolone acetate (DEPO-MEDROL) injection 80 mg  . albuterol (PROVENTIL) (2.5 MG/3ML) 0.083% nebulizer solution    Sig: Take 3 mLs (2.5 mg total) by nebulization every 6 (six) hours as needed for wheezing or shortness of breath.    Dispense:  150 mL    Refill:  1    CMA served as scribe during this visit. History, Physical and Plan performed by medical provider. Documentation and orders reviewed and attested to.  Ann Held, DO

## 2017-04-26 NOTE — Assessment & Plan Note (Signed)
abx -- started by her daughter yesterday-- augmentin Finish course pred taper depomedrol 80 mg im today Cough med per orders

## 2017-04-27 ENCOUNTER — Ambulatory Visit: Payer: Medicare HMO | Admitting: Psychology

## 2017-04-27 ENCOUNTER — Ambulatory Visit (HOSPITAL_BASED_OUTPATIENT_CLINIC_OR_DEPARTMENT_OTHER)
Admission: RE | Admit: 2017-04-27 | Discharge: 2017-04-27 | Disposition: A | Discharge status: Home / Self Care | Payer: Medicare HMO | Source: Ambulatory Visit | Attending: Family Medicine | Admitting: Family Medicine

## 2017-04-27 DIAGNOSIS — R05 Cough: Secondary | ICD-10-CM | POA: Diagnosis not present

## 2017-04-27 DIAGNOSIS — R059 Cough, unspecified: Secondary | ICD-10-CM

## 2017-04-27 DIAGNOSIS — J40 Bronchitis, not specified as acute or chronic: Secondary | ICD-10-CM

## 2017-04-28 ENCOUNTER — Other Ambulatory Visit: Payer: Self-pay

## 2017-06-11 ENCOUNTER — Other Ambulatory Visit: Payer: Self-pay | Admitting: Family Medicine

## 2017-06-15 ENCOUNTER — Ambulatory Visit
Admission: RE | Admit: 2017-06-15 | Discharge: 2017-06-15 | Disposition: A | Discharge status: Home / Self Care | Payer: Medicare HMO | Source: Ambulatory Visit | Attending: Family Medicine | Admitting: Family Medicine

## 2017-06-15 DIAGNOSIS — N644 Mastodynia: Secondary | ICD-10-CM

## 2017-06-15 DIAGNOSIS — N6489 Other specified disorders of breast: Secondary | ICD-10-CM | POA: Diagnosis not present

## 2017-06-15 MED ORDER — GADOBENATE DIMEGLUMINE 529 MG/ML IV SOLN
10.0000 mL | Freq: Once | INTRAVENOUS | Status: AC | PRN
  Administered 2017-06-15: 10 mL via INTRAVENOUS

## 2017-06-23 NOTE — Progress Notes (Addendum)
Subjective:   Julie Wilkerson is a 77 y.o. female who presents for Medicare Annual (Subsequent) preventive examination.  Review of Systems: No ROS.  Medicare Wellness Visit. Additional risk factors are reflected in the social history. Cardiac Risk Factors include: advanced age (>19men, >59 women);hypertension;sedentary lifestyle;dyslipidemia Sleep patterns: Sleeps about 8 hrs per night. Feels rested  Home Safety/Smoke Alarms: Feels safe in home. Smoke alarms in place.  Living environment; residence and Firearm Safety: Lives alone. 1 story. Feels safe.  Seat Belt Safety/Bike Helmet: Wears seat belt.   Female:     Mammo- Last 12/22/16-BI-RADS CATEGORY  2: Benign.     Dexa scan-  Last 11/07/14: osteopenia. ORDERED TODAY   CCS-Pt reports last 2016. Pt reports gastritis.   Objective:     Vitals: BP 110/60 (BP Location: Left Arm, Patient Position: Sitting, Cuff Size: Normal)   Pulse (!) 53   Ht 4\' 11"  (1.499 m)   Wt 127 lb 12.8 oz (58 kg)   SpO2 97%   BMI 25.81 kg/m   Body mass index is 25.81 kg/m.  Advanced Directives 06/27/2017 04/29/2016  Does Patient Have a Medical Advance Directive? No No  Would patient like information on creating a medical advance directive? No - Patient declined No - Patient declined    Tobacco Social History   Tobacco Use  Smoking Status Never Smoker  Smokeless Tobacco Never Used     Counseling given: Not Answered   Clinical Intake: Pain : No/denies pain   Past Medical History:  Diagnosis Date  . Asthma   . Breast cancer ([place])    2002, left, encapsulated, microcalcifications. lumpectomy, radtiation x 30  . Breast cancer in female [facility])   . Change in mole 06/08/2016  . Colitis   . Decreased visual acuity 01/16/2017  . Dyspnea 11/04/2016  . Dysuria 11/04/2016  . Gluten intolerance   . History of chicken pox   . History of Helicobacter pylori infection 08/01/2015  . Hyperlipidemia   . Hypertension   . Hypothyroid   . IBS (irritable bowel  syndrome)   . Left leg pain 03/01/2017  . Low back pain 08/10/2015  . Neck pain 03/01/2017  . Osteoporosis   . RLQ discomfort 03/01/2017  . Stomach cramps   . Urinary tract infection 11/04/2016   Past Surgical History:  Procedure Laterality Date  . ABDOMINAL HYSTERECTOMY     partial  . APPENDECTOMY    . BREAST LUMPECTOMY Left 2002  . CHOLECYSTECTOMY  Age 66 or 89  . COLONOSCOPY  2017  . FRACTURE SURGERY    . TONSILLECTOMY     Family History  Problem Relation Age of Onset  . Colitis Mother   . Irritable bowel syndrome Mother   . Hypertension Father   . Hyperlipidemia Brother   . Heart attack Brother   . Stomach cancer Paternal Grandmother 58  . Hyperlipidemia Brother   . Heart attack Brother   . Hyperlipidemia Brother   . Heart attack Brother   . Hyperlipidemia Sister   . Leukemia Daughter   . Arthritis Daughter   . Heart disease Daughter        ASD vs VSD   Social History   Socioeconomic History  . Marital status: Single    Spouse name: None  . Number of children: None  . Years of education: None  . Highest education level: None  Social Needs  . Financial resource strain: None  . Food insecurity - worry: None  . Food insecurity - inability:  None  . Transportation needs - medical: None  . Transportation needs - non-medical: None  Occupational History  . Occupation: retired    Comment: Pharmacist, hospital, kindergarten  Tobacco Use  . Smoking status: Never Smoker  . Smokeless tobacco: Never Used  Substance and Sexual Activity  . Alcohol use: No    Alcohol/week: 0.0 oz  . Drug use: No  . Sexual activity: No    Comment: lives alone, avoids daiiry and gluten. 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 None  Social History Narrative  . None    Outpatient Encounter Medications as of 06/27/2017  Medication Sig  . albuterol (PROVENTIL HFA;VENTOLIN HFA) 108 (90 Base) MCG/ACT inhaler Inhale 2 puffs into the lungs every 4 (four) hours as needed for wheezing or  shortness of breath.  Marland Kitchen albuterol (PROVENTIL) (2.5 MG/3ML) 0.083% nebulizer solution Take 3 mLs (2.5 mg total) by nebulization every 6 (six) hours as needed for wheezing or shortness of breath.  Marland Kitchen aspirin 81 MG tablet Take 81 mg by mouth daily.  . beclomethasone (QVAR REDIHALER) 80 MCG/ACT inhaler Inhale 2 puffs into the lungs 2 (two) times daily.  . citalopram (CELEXA) 20 MG tablet Take 1 tablet (20 mg total) by mouth daily.  . Collagen-Vitamin C (COLLAGEN PLUS VITAMIN C PO) Take 1 tablet by mouth daily.  . hyoscyamine (LEVSIN SL) 0.125 MG SL tablet Place 0.125 mg under the tongue every 4 (four) hours as needed for cramping.  Marland Kitchen levothyroxine (SYNTHROID, LEVOTHROID) 50 MCG tablet TAKE 1 TABLET BY MOUTH ONCE DAILY  . lisinopril (PRINIVIL,ZESTRIL) 20 MG tablet Take 1 tablet (20 mg total) by mouth daily.  . metoprolol succinate (TOPROL XL) 25 MG 24 hr tablet Take 0.5 tablets (12.5 mg total) by mouth daily.  . Vitamin D, Ergocalciferol, (DRISDOL) 50000 units CAPS capsule Take 1 capsule (50,000 Units total) by mouth every 7 (seven) days.  . Multiple Vitamins-Minerals (MULTIVITAMIN ADULT PO) Take 1 tablet by mouth daily.  . promethazine-dextromethorphan (PROMETHAZINE-DM) 6.25-15 MG/5ML syrup Take 5 mLs by mouth 4 (four) times daily as needed. (Patient not taking: Reported on 06/27/2017)  . [DISCONTINUED] predniSONE (DELTASONE) 10 MG tablet TAKE 3 TABLETS PO QD FOR 3 DAYS THEN TAKE 2 TABLETS PO QD FOR 3 DAYS THEN TAKE 1 TABLET PO QD FOR 3 DAYS THEN TAKE 1/2 TAB PO QD FOR 3 DAYS   No facility-administered encounter medications on file as of 06/27/2017.     Activities of Daily Living In your present state of health, do you have any difficulty performing the following activities: 06/27/2017  Hearing? N  Vision? N  Comment pt follows with eye doctor yearly. Last appt 04/23/2017  Difficulty concentrating or making decisions? N  Walking or climbing stairs? N  Dressing or bathing? N  Doing errands, shopping? N    Preparing Food and eating ? N  Using the Toilet? N  In the past six months, have you accidently leaked urine? Y  Comment wears pads  Do you have problems with loss of bowel control? N  Managing your Medications? N  Managing your Finances? N  Housekeeping or managing your Housekeeping? N  Some recent data might be hidden    Patient Care Team: [name], MD as PCP - General (Family Medicine) Lake Bells., MD as Consulting Physician (Gastroenterology) Belva Bertin, Armida Sans as Physician Assistant (Family Medicine)    Assessment:   This is a routine wellness examination for Kyrianna. Physical assessment deferred to PCP. Exercise Activities and Dietary  recommendations Current Exercise Habits: The patient does not participate in regular exercise at present, Exercise limited by: None identified Diet (meal preparation, eat out, water intake, caffeinated beverages, dairy products, fruits and vegetables): in general, a "healthy" diet  , well balanced    Goals    . Restart exercising 4x/ week.       Fall Risk Fall Risk  06/27/2017 04/29/2016 03/12/2016 02/05/2016  Falls in the past year? No No No No     Depression Screen PHQ 2/9 Scores 06/27/2017 04/26/2017 04/29/2016 02/05/2016  PHQ - 2 Score 0 0 0 0  PHQ- 9 Score - 0 - -     Cognitive Function MMSE - Mini Mental State Exam 06/27/2017 04/29/2016  Orientation to time 5 5  Orientation to Place 5 5  Registration 3 3  Attention/ Calculation 5 5  Recall 3 3  Language- name 2 objects 2 2  Language- repeat 1 1  Language- follow 3 step command 3 3  Language- read & follow direction 1 1  Write a sentence 1 1  Copy design 1 1  Total score 30 30        Immunization History  Administered Date(s) Administered  . Influenza, High Dose Seasonal PF 03/11/2015, 02/05/2016, 02/05/2017  . Influenza,inj,quad, With Preservative 05/23/2014  . Pneumococcal Polysaccharide-23 05/23/2014  . Td 02/05/2016  . Zoster 05/24/2009    Screening  Tests Health Maintenance  Topic Date Due  . PNA vac Low Risk Adult (2 of 2 - PCV13) 06/27/2018 (Originally 05/24/2015)  . COLONOSCOPY  08/04/2025  . TETANUS/TDAP  02/04/2026  . INFLUENZA VACCINE  Completed  . DEXA SCAN  Completed       Plan:   Follow up with Dr.Blyth as scheduled  Continue to eat heart healthy diet (full of fruits, vegetables, whole grains, lean protein, water--limit salt, fat, and sugar intake) and increase physical activity as tolerated.  Continue doing brain stimulating activities (puzzles, reading, adult coloring books, staying active) to keep memory sharp.    I have personally reviewed and noted the following in the patient's chart:   . Medical and social history . Use of alcohol, tobacco or illicit drugs  . Current medications and supplements . Functional ability and status . Nutritional status . Physical activity . Advanced directives . List of other physicians . Hospitalizations, surgeries, and ER visits in previous 12 months . Vitals . Screenings to include cognitive, depression, and falls . Referrals and appointments  In addition, I have reviewed and discussed with patient certain preventive protocols, quality metrics, and best practice recommendations. A written personalized care plan for preventive services as well as general preventive health recommendations were provided to patient.     Shela Nevin, [place]  06/27/2017   Medical screening examination/treatment was performed by qualified clinical staff member and as supervising physician I was immediately available for consultation/collaboration. I have reviewed documentation and agree with assessment and plan.  Penni Homans, MD

## 2017-06-27 ENCOUNTER — Ambulatory Visit (INDEPENDENT_AMBULATORY_CARE_PROVIDER_SITE_OTHER): Payer: Medicare HMO | Admitting: Family Medicine

## 2017-06-27 ENCOUNTER — Encounter: Payer: Self-pay | Admitting: *Deleted

## 2017-06-27 ENCOUNTER — Encounter: Payer: Self-pay | Admitting: Family Medicine

## 2017-06-27 ENCOUNTER — Ambulatory Visit (INDEPENDENT_AMBULATORY_CARE_PROVIDER_SITE_OTHER): Payer: Medicare HMO | Admitting: *Deleted

## 2017-06-27 VITALS — BP 110/60 | HR 53 | Ht 59.0 in | Wt 127.8 lb

## 2017-06-27 DIAGNOSIS — Z0000 Encounter for general adult medical examination without abnormal findings: Secondary | ICD-10-CM | POA: Diagnosis not present

## 2017-06-27 DIAGNOSIS — R109 Unspecified abdominal pain: Secondary | ICD-10-CM

## 2017-06-27 DIAGNOSIS — J0140 Acute pansinusitis, unspecified: Secondary | ICD-10-CM

## 2017-06-27 DIAGNOSIS — Z780 Asymptomatic menopausal state: Secondary | ICD-10-CM

## 2017-06-27 DIAGNOSIS — J329 Chronic sinusitis, unspecified: Secondary | ICD-10-CM

## 2017-06-27 DIAGNOSIS — E559 Vitamin D deficiency, unspecified: Secondary | ICD-10-CM | POA: Diagnosis not present

## 2017-06-27 DIAGNOSIS — E039 Hypothyroidism, unspecified: Secondary | ICD-10-CM

## 2017-06-27 DIAGNOSIS — I10 Essential (primary) hypertension: Secondary | ICD-10-CM

## 2017-06-27 DIAGNOSIS — E782 Mixed hyperlipidemia: Secondary | ICD-10-CM

## 2017-06-27 MED ORDER — METHYLPREDNISOLONE 4 MG PO TABS: ORAL_TABLET | ORAL | 0 refills | Status: DC

## 2017-06-27 MED ORDER — CEFDINIR 300 MG PO CAPS: 300.0000 mg | ORAL_CAPSULE | Freq: Two times a day (BID) | ORAL | 0 refills | Status: AC

## 2017-06-27 NOTE — Patient Instructions (Signed)
Continue to eat heart healthy diet (full of fruits, vegetables, whole grains, lean protein, water--limit salt, fat, and sugar intake) and increase physical activity as tolerated.'  Continue doing brain stimulating activities (puzzles, reading, adult coloring books, staying active) to keep memory sharp.    [name] , Thank you for taking time to come for your Medicare Wellness Visit. I appreciate your ongoing commitment to your health goals. Please review the following plan we discussed and let me know if I can assist you in the future.   These are the goals we discussed: Goals    . Restart exercising 4x/ week.       This is a list of the screening recommended for you and due dates:  Health Maintenance  Topic Date Due  . Pneumonia vaccines (2 of 2 - PCV13) 06/27/2018*  . Colon Cancer Screening  08/04/2025  . Tetanus Vaccine  02/04/2026  . Flu Shot  Completed  . DEXA scan (bone density measurement)  Completed  *Topic was postponed. The date shown is not the original due date.     Health Maintenance for Postmenopausal Women Menopause is a normal process in which your reproductive ability comes to an end. This process happens gradually over a span of months to years, usually between the ages of 40 and 40. Menopause is complete when you have missed 12 consecutive menstrual periods. It is important to talk with your health care provider about some of the most common conditions that affect postmenopausal women, such as heart disease, cancer, and bone loss (osteoporosis). Adopting a healthy lifestyle and getting preventive care can help to promote your health and wellness. Those actions can also lower your chances of developing some of these common conditions. What should I know about menopause? During menopause, you may experience a number of symptoms, such as:  Moderate-to-severe hot flashes.  Night sweats.  Decrease in sex drive.  Mood  swings.  Headaches.  Tiredness.  Irritability.  Memory problems.  Insomnia.  Choosing to treat or not to treat menopausal changes is an individual decision that you make with your health care provider. What should I know about hormone replacement therapy and supplements? Hormone therapy products are effective for treating symptoms that are associated with menopause, such as hot flashes and night sweats. Hormone replacement carries certain risks, especially as you become older. If you are thinking about using estrogen or estrogen with progestin treatments, discuss the benefits and risks with your health care provider. What should I know about heart disease and stroke? Heart disease, heart attack, and stroke become more likely as you age. This may be due, in part, to the hormonal changes that your body experiences during menopause. These can affect how your body processes dietary fats, triglycerides, and cholesterol. Heart attack and stroke are both medical emergencies. There are many things that you can do to help prevent heart disease and stroke:  Have your blood pressure checked at least every 1-2 years. High blood pressure causes heart disease and increases the risk of stroke.  If you are 83-16 years old, ask your health care provider if you should take aspirin to prevent a heart attack or a stroke.  Do not use any tobacco products, including cigarettes, chewing tobacco, or electronic cigarettes. If you need help quitting, ask your health care provider.  It is important to eat a healthy diet and maintain a healthy weight. ? Be sure to include plenty of vegetables, fruits, low-fat dairy products, and lean protein. ? Avoid eating  foods that are high in solid fats, added sugars, or salt (sodium).  Get regular exercise. This is one of the most important things that you can do for your health. ? Try to exercise for at least 150 minutes each week. The type of exercise that you do should  increase your heart rate and make you sweat. This is known as moderate-intensity exercise. ? Try to do strengthening exercises at least twice each week. Do these in addition to the moderate-intensity exercise.  Know your numbers.Ask your health care provider to check your cholesterol and your blood glucose. Continue to have your blood tested as directed by your health care provider.  What should I know about cancer screening? There are several types of cancer. Take the following steps to reduce your risk and to catch any cancer development as early as possible. Breast Cancer  Practice breast self-awareness. ? This means understanding how your breasts normally appear and feel. ? It also means doing regular breast self-exams. Let your health care provider know about any changes, no matter how small.  If you are 73 or older, have a clinician do a breast exam (clinical breast exam or CBE) every year. Depending on your age, family history, and medical history, it may be recommended that you also have a yearly breast X-ray (mammogram).  If you have a family history of breast cancer, talk with your health care provider about genetic screening.  If you are at high risk for breast cancer, talk with your health care provider about having an MRI and a mammogram every year.  Breast cancer (BRCA) gene test is recommended for women who have family members with BRCA-related cancers. Results of the assessment will determine the need for genetic counseling and BRCA1 and for BRCA2 testing. BRCA-related cancers include these types: ? Breast. This occurs in males or females. ? Ovarian. ? Tubal. This may also be called fallopian tube cancer. ? Cancer of the abdominal or pelvic lining (peritoneal cancer). ? Prostate. ? Pancreatic.  Cervical, Uterine, and Ovarian Cancer Your health care provider may recommend that you be screened regularly for cancer of the pelvic organs. These include your ovaries, uterus,  and vagina. This screening involves a pelvic exam, which includes checking for microscopic changes to the surface of your cervix (Pap test).  For women ages 21-65, health care providers may recommend a pelvic exam and a Pap test every three years. For women ages 48-65, they may recommend the Pap test and pelvic exam, combined with testing for human papilloma virus (HPV), every five years. Some types of HPV increase your risk of cervical cancer. Testing for HPV may also be done on women of any age who have unclear Pap test results.  Other health care providers may not recommend any screening for nonpregnant women who are considered low risk for pelvic cancer and have no symptoms. Ask your health care provider if a screening pelvic exam is right for you.  If you have had past treatment for cervical cancer or a condition that could lead to cancer, you need Pap tests and screening for cancer for at least 20 years after your treatment. If Pap tests have been discontinued for you, your risk factors (such as having a new sexual partner) need to be reassessed to determine if you should start having screenings again. Some women have medical problems that increase the chance of getting cervical cancer. In these cases, your health care provider may recommend that you have screening and Pap tests more  often.  If you have a family history of uterine cancer or ovarian cancer, talk with your health care provider about genetic screening.  If you have vaginal bleeding after reaching menopause, tell your health care provider.  There are currently no reliable tests available to screen for ovarian cancer.  Lung Cancer Lung cancer screening is recommended for adults 48-38 years old who are at high risk for lung cancer because of a history of smoking. A yearly low-dose CT scan of the lungs is recommended if you:  Currently smoke.  Have a history of at least 30 pack-years of smoking and you currently smoke or have quit  within the past 15 years. A pack-year is smoking an average of one pack of cigarettes per day for one year.  Yearly screening should:  Continue until it has been 15 years since you quit.  Stop if you develop a health problem that would prevent you from having lung cancer treatment.  Colorectal Cancer  This type of cancer can be detected and can often be prevented.  Routine colorectal cancer screening usually begins at age 56 and continues through age 48.  If you have risk factors for colon cancer, your health care provider may recommend that you be screened at an earlier age.  If you have a family history of colorectal cancer, talk with your health care provider about genetic screening.  Your health care provider may also recommend using home test kits to check for hidden blood in your stool.  A small camera at the end of a tube can be used to examine your colon directly (sigmoidoscopy or colonoscopy). This is done to check for the earliest forms of colorectal cancer.  Direct examination of the colon should be repeated every 5-10 years until age 26. However, if early forms of precancerous polyps or small growths are found or if you have a family history or genetic risk for colorectal cancer, you may need to be screened more often.  Skin Cancer  Check your skin from head to toe regularly.  Monitor any moles. Be sure to tell your health care provider: ? About any new moles or changes in moles, especially if there is a change in a mole's shape or color. ? If you have a mole that is larger than the size of a pencil eraser.  If any of your family members has a history of skin cancer, especially at a young age, talk with your health care provider about genetic screening.  Always use sunscreen. Apply sunscreen liberally and repeatedly throughout the day.  Whenever you are outside, protect yourself by wearing long sleeves, pants, a wide-brimmed hat, and sunglasses.  What should I know  about osteoporosis? Osteoporosis is a condition in which bone destruction happens more quickly than new bone creation. After menopause, you may be at an increased risk for osteoporosis. To help prevent osteoporosis or the bone fractures that can happen because of osteoporosis, the following is recommended:  If you are 98-10 years old, get at least 1,000 mg of calcium and at least 600 mg of vitamin D per day.  If you are older than age 44 but younger than age 88, get at least 1,200 mg of calcium and at least 600 mg of vitamin D per day.  If you are older than age 61, get at least 1,200 mg of calcium and at least 800 mg of vitamin D per day.  Smoking and excessive alcohol intake increase the risk of osteoporosis. Eat foods that  are rich in calcium and vitamin D, and do weight-bearing exercises several times each week as directed by your health care provider. What should I know about how menopause affects my mental health? Depression may occur at any age, but it is more common as you become older. Common symptoms of depression include:  Low or sad mood.  Changes in sleep patterns.  Changes in appetite or eating patterns.  Feeling an overall lack of motivation or enjoyment of activities that you previously enjoyed.  Frequent crying spells.  Talk with your health care provider if you think that you are experiencing depression. What should I know about immunizations? It is important that you get and maintain your immunizations. These include:  Tetanus, diphtheria, and pertussis (Tdap) booster vaccine.  Influenza every year before the flu season begins.  Pneumonia vaccine.  Shingles vaccine.  Your health care provider may also recommend other immunizations. This information is not intended to replace advice given to you by your health care provider. Make sure you discuss any questions you have with your health care provider. Document Released: 07/02/2005 Document Revised: 11/28/2015  Document Reviewed: 02/11/2015 Elsevier Interactive Patient Education  2018 [employer].

## 2017-06-27 NOTE — Patient Instructions (Signed)
Tylenol/Acetaminophen ES 500 mg tabs, 1-2 tabs twice daily, max of 3000 mg in 24 hours Sinusitis, Adult Sinusitis is soreness and inflammation of your sinuses. Sinuses are hollow spaces in the bones around your face. They are located:  Around your eyes.  In the middle of your forehead.  Behind your nose.  In your cheekbones.  Your sinuses and nasal passages are lined with a stringy fluid (mucus). Mucus normally drains out of your sinuses. When your nasal tissues get inflamed or swollen, the mucus can get trapped or blocked so air cannot flow through your sinuses. This lets bacteria, viruses, and funguses grow, and that leads to infection. Follow these instructions at home: Medicines  Take, use, or apply over-the-counter and prescription medicines only as told by your doctor. These may include nasal sprays.  If you were prescribed an antibiotic medicine, take it as told by your doctor. Do not stop taking the antibiotic even if you start to feel better. Hydrate and Humidify  Drink enough water to keep your pee (urine) clear or pale yellow.  Use a cool mist humidifier to keep the humidity level in your home above 50%.  Breathe in steam for 10-15 minutes, 3-4 times a day or as told by your doctor. You can do this in the bathroom while a hot shower is running.  Try not to spend time in cool or dry air. Rest  Rest as much as possible.  Sleep with your head raised (elevated).  Make sure to get enough sleep each night. General instructions  Put a warm, moist washcloth on your face 3-4 times a day or as told by your doctor. This will help with discomfort.  Wash your hands often with soap and water. If there is no soap and water, use hand sanitizer.  Do not smoke. Avoid being around people who are smoking (secondhand smoke).  Keep all follow-up visits as told by your doctor. This is important. Contact a doctor if:  You have a fever.  Your symptoms get worse.  Your symptoms do  not get better within 10 days. Get help right away if:  You have a very bad headache.  You cannot stop throwing up (vomiting).  You have pain or swelling around your face or eyes.  You have trouble seeing.  You feel confused.  Your neck is stiff.  You have trouble breathing. This information is not intended to replace advice given to you by your health care provider. Make sure you discuss any questions you have with your health care provider. Document Released: 10/27/2007 Document Revised: 01/04/2016 Document Reviewed: 03/05/2015 Elsevier Interactive Patient Education  [employer].

## 2017-06-28 ENCOUNTER — Ambulatory Visit (HOSPITAL_BASED_OUTPATIENT_CLINIC_OR_DEPARTMENT_OTHER)
Admission: RE | Admit: 2017-06-28 | Discharge: 2017-06-28 | Disposition: A | Discharge status: Home / Self Care | Payer: Medicare HMO | Source: Ambulatory Visit | Attending: Family Medicine | Admitting: Family Medicine

## 2017-06-28 DIAGNOSIS — Z780 Asymptomatic menopausal state: Secondary | ICD-10-CM

## 2017-06-28 DIAGNOSIS — M85852 Other specified disorders of bone density and structure, left thigh: Secondary | ICD-10-CM | POA: Diagnosis not present

## 2017-06-28 DIAGNOSIS — Z79899 Other long term (current) drug therapy: Secondary | ICD-10-CM | POA: Insufficient documentation

## 2017-06-28 DIAGNOSIS — E039 Hypothyroidism, unspecified: Secondary | ICD-10-CM | POA: Diagnosis not present

## 2017-06-28 DIAGNOSIS — Z79890 Hormone replacement therapy (postmenopausal): Secondary | ICD-10-CM | POA: Diagnosis not present

## 2017-06-28 LAB — CBC WITH DIFFERENTIAL/PLATELET
BASOS ABS: 0.1 10*3/uL (ref 0.0–0.1)
Basophils Relative: 1.1 % (ref 0.0–3.0)
EOS ABS: 0 10*3/uL (ref 0.0–0.7)
Eosinophils Relative: 0 % (ref 0.0–5.0)
HEMATOCRIT: 34.7 % — AB (ref 36.0–46.0)
HEMOGLOBIN: 11.6 g/dL — AB (ref 12.0–15.0)
LYMPHS PCT: 31.5 % (ref 12.0–46.0)
Lymphs Abs: 2.1 10*3/uL (ref 0.7–4.0)
MCHC: 33.4 g/dL (ref 30.0–36.0)
MCV: 90.6 fl (ref 78.0–100.0)
MONO ABS: 0.8 10*3/uL (ref 0.1–1.0)
Monocytes Relative: 11.3 % (ref 3.0–12.0)
Neutro Abs: 3.8 10*3/uL (ref 1.4–7.7)
Neutrophils Relative %: 56.1 % (ref 43.0–77.0)
Platelets: 226 10*3/uL (ref 150.0–400.0)
RBC: 3.83 Mil/uL — AB (ref 3.87–5.11)
RDW: 15.1 % (ref 11.5–15.5)
WBC: 6.8 10*3/uL (ref 4.0–10.5)

## 2017-06-28 LAB — AMYLASE: AMYLASE: 52 U/L (ref 27–131)

## 2017-06-28 LAB — LIPASE: LIPASE: 34 U/L (ref 11.0–59.0)

## 2017-06-28 LAB — SEDIMENTATION RATE: SED RATE: 15 mm/h (ref 0–30)

## 2017-07-03 NOTE — Progress Notes (Signed)
Patient ID: Julie Wilkerson, female   DOB: 07-Dec-1940, 77 y.o.   MRN: [id number]   Subjective:    Patient ID: Julie Wilkerson, female    DOB: 11/05/1940, 77 y.o.   MRN: [id number]  No chief complaint on file.   HPI Patient is in today for evaluation of worsening headache and congestion.  She has been struggling with roughly 2 weeks worth of chills, malaise and myalgias.  She is describing facial pressure and headache most notably on the right side.  She notes rhinorrhea which is largely clear but sometimes has a color.  She also endorses some right ear pressure and a sore throat which is relieved temporarily without water gargles.  She continues to have intermittent abdominal pain which is not persistent but notable when it occurs.  She describes it is sharp and cramping at times. Denies CP/palp/SOB/fevers or GU c/o. Taking meds as prescribed  Past Medical History:  Diagnosis Date  . Asthma   . Breast cancer ([place])    2002, left, encapsulated, microcalcifications. lumpectomy, radtiation x 30  . Breast cancer in female [facility])   . Change in mole 06/08/2016  . Colitis   . Decreased visual acuity 01/16/2017  . Dyspnea 11/04/2016  . Dysuria 11/04/2016  . Gluten intolerance   . History of chicken pox   . History of Helicobacter pylori infection 08/01/2015  . Hyperlipidemia   . Hypertension   . Hypothyroid   . IBS (irritable bowel syndrome)   . Left leg pain 03/01/2017  . Low back pain 08/10/2015  . Neck pain 03/01/2017  . Osteoporosis   . RLQ discomfort 03/01/2017  . Stomach cramps   . Urinary tract infection 11/04/2016    Past Surgical History:  Procedure Laterality Date  . ABDOMINAL HYSTERECTOMY     partial  . APPENDECTOMY    . BREAST LUMPECTOMY Left 2002  . CHOLECYSTECTOMY  Age 76 or 5  . COLONOSCOPY  2017  . FRACTURE SURGERY    . TONSILLECTOMY      Family History  Problem Relation Age of Onset  . Colitis Mother   . Irritable bowel syndrome Mother   . Hypertension Father   .  Hyperlipidemia Brother   . Heart attack Brother   . Stomach cancer Paternal Grandmother 73  . Hyperlipidemia Brother   . Heart attack Brother   . Hyperlipidemia Brother   . Heart attack Brother   . Hyperlipidemia Sister   . Leukemia Daughter   . Arthritis Daughter   . Heart disease Daughter        ASD vs VSD    Social History   Socioeconomic History  . Marital status: Single    Spouse name: Not on file  . Number of children: Not on file  . Years of education: Not on file  . Highest education level: Not on file  Social Needs  . Financial resource strain: Not on file  . Food insecurity - worry: Not on file  . Food insecurity - inability: Not on file  . Transportation needs - medical: Not on file  . Transportation needs - non-medical: Not on file  Occupational History  . Occupation: retired    Comment: Pharmacist, hospital, kindergarten  Tobacco Use  . Smoking status: Never Smoker  . Smokeless tobacco: Never Used  Substance and Sexual Activity  . Alcohol use: No    Alcohol/week: 0.0 oz  . Drug use: No  . Sexual activity: No    Comment: lives alone, avoids daiiry and gluten. 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  Other Topics Concern  . Not on file  Social History Narrative  . Not on file    Outpatient Medications Prior to Visit  Medication Sig Dispense Refill  . albuterol (PROVENTIL HFA;VENTOLIN HFA) 108 (90 Base) MCG/ACT inhaler Inhale 2 puffs into the lungs every 4 (four) hours as needed for wheezing or shortness of breath.    Marland Kitchen albuterol (PROVENTIL) (2.5 MG/3ML) 0.083% nebulizer solution Take 3 mLs (2.5 mg total) by nebulization every 6 (six) hours as needed for wheezing or shortness of breath. 150 mL 1  . aspirin 81 MG tablet Take 81 mg by mouth daily.    . beclomethasone (QVAR REDIHALER) 80 MCG/ACT inhaler Inhale 2 puffs into the lungs 2 (two) times daily. 1 Inhaler 0  . citalopram (CELEXA) 20 MG tablet Take 1 tablet (20 mg total) by mouth daily. 90 tablet 1  . Collagen-Vitamin C  (COLLAGEN PLUS VITAMIN C PO) Take 1 tablet by mouth daily.    . hyoscyamine (LEVSIN SL) 0.125 MG SL tablet Place 0.125 mg under the tongue every 4 (four) hours as needed for cramping.    Marland Kitchen levothyroxine (SYNTHROID, LEVOTHROID) 50 MCG tablet TAKE 1 TABLET BY MOUTH ONCE DAILY 90 tablet 1  . lisinopril (PRINIVIL,ZESTRIL) 20 MG tablet Take 1 tablet (20 mg total) by mouth daily. 30 tablet 3  . metoprolol succinate (TOPROL XL) 25 MG 24 hr tablet Take 0.5 tablets (12.5 mg total) by mouth daily. 45 tablet 3  . Multiple Vitamins-Minerals (MULTIVITAMIN ADULT PO) Take 1 tablet by mouth daily.    . promethazine-dextromethorphan (PROMETHAZINE-DM) 6.25-15 MG/5ML syrup Take 5 mLs by mouth 4 (four) times daily as needed. (Patient not taking: Reported on 06/27/2017) 118 mL 0  . Vitamin D, Ergocalciferol, (DRISDOL) 50000 units CAPS capsule Take 1 capsule (50,000 Units total) by mouth every 7 (seven) days. 12 capsule 1   No facility-administered medications prior to visit.     No Known Allergies  Review of Systems  Constitutional: Negative for fever and malaise/fatigue.  HENT: Positive for congestion and sore throat.   Eyes: Negative for blurred vision.  Respiratory: Positive for cough and sputum production. Negative for shortness of breath.   Cardiovascular: Negative for chest pain, palpitations and leg swelling.  Gastrointestinal: Negative for abdominal pain, blood in stool and nausea.  Genitourinary: Negative for dysuria and frequency.  Musculoskeletal: Negative for falls.  Skin: Negative for rash.  Neurological: Positive for headaches. Negative for dizziness and loss of consciousness.  Endo/Heme/Allergies: Negative for environmental allergies.  Psychiatric/Behavioral: Negative for depression. The patient is not nervous/anxious.        Objective:    Physical Exam  Constitutional: She is oriented to person, place, and time. She appears well-developed and well-nourished. No distress.  HENT:  Head:  Normocephalic and atraumatic.  Nose: Nose normal.  TMs dull, nasal mucosa boggy and erythematous  Eyes: Right eye exhibits no discharge. Left eye exhibits no discharge.  Neck: Normal range of motion. Neck supple.  Cardiovascular: Normal rate and regular rhythm.  No murmur heard. Pulmonary/Chest: Effort normal and breath sounds normal.  Abdominal: Soft. Bowel sounds are normal. There is no tenderness.  Musculoskeletal: She exhibits no edema.  Neurological: She is alert and oriented to person, place, and time.  Skin: Skin is warm and dry.  Psychiatric: She has a normal mood and affect.  Nursing note and vitals reviewed.   There were no vitals taken for this visit. Wt Readings from Last 3 Encounters:  06/27/17 127 lb  12.8 oz (58 kg)  04/26/17 124 lb (56.2 kg)  03/28/17 125 lb 12.8 oz (57.1 kg)     Lab Results  Component Value Date   WBC 6.8 06/27/2017   HGB 11.6 (L) 06/27/2017   HCT 34.7 (L) 06/27/2017   PLT 226.0 06/27/2017   GLUCOSE 109 (H) 12/07/2016   CHOL 236 (H) 03/01/2017   TRIG 116.0 03/01/2017   HDL 72.70 03/01/2017   LDLCALC 140 (H) 03/01/2017   ALT 14 12/07/2016   AST 21 12/07/2016   NA 135 12/07/2016   K 4.3 12/07/2016   CL 103 12/07/2016   CREATININE 1.05 12/07/2016   BUN 31 (H) 12/07/2016   CO2 26 12/07/2016   TSH 2.42 03/01/2017   HGBA1C 6.0 03/01/2017    Lab Results  Component Value Date   TSH 2.42 03/01/2017   Lab Results  Component Value Date   WBC 6.8 06/27/2017   HGB 11.6 (L) 06/27/2017   HCT 34.7 (L) 06/27/2017   MCV 90.6 06/27/2017   PLT 226.0 06/27/2017   Lab Results  Component Value Date   NA 135 12/07/2016   K 4.3 12/07/2016   CO2 26 12/07/2016   GLUCOSE 109 (H) 12/07/2016   BUN 31 (H) 12/07/2016   CREATININE 1.05 12/07/2016   BILITOT 0.6 12/07/2016   ALKPHOS 49 12/07/2016   AST 21 12/07/2016   ALT 14 12/07/2016   PROT 7.1 12/07/2016   ALBUMIN 4.2 12/07/2016   CALCIUM 9.2 12/07/2016   GFR 54.30 (L) 12/07/2016   Lab  Results  Component Value Date   CHOL 236 (H) 03/01/2017   Lab Results  Component Value Date   HDL 72.70 03/01/2017   Lab Results  Component Value Date   LDLCALC 140 (H) 03/01/2017   Lab Results  Component Value Date   TRIG 116.0 03/01/2017   Lab Results  Component Value Date   CHOLHDL 3 03/01/2017   Lab Results  Component Value Date   HGBA1C 6.0 03/01/2017       Assessment & Plan:   Problem List Items Addressed This Visit    Hyperlipidemia    Encouraged heart healthy diet, increase exercise, avoid trans fats, consider a krill oil cap daily      Essential hypertension    Well controlled, no changes to meds. Encouraged heart healthy diet such as the DASH diet and exercise as tolerated.       Hypothyroid    On Levothyroxine, continue to monitor      Vitamin D deficiency    Encouraged daily supplements      Abdominal pain    Intermittent and more tolerable. Pancreatic enzymes negative. Report worsening symptoms      Relevant Orders   CBC with Differential/Platelet (Completed)   Sedimentation rate (Completed)   Amylase (Completed)   Lipase (Completed)   Acute pansinusitis    Encouraged increased rest and hydration, add probiotics, zinc such as Coldeze or Xicam. Treat fevers as needed. Elderberry, vitamin C start antibiotics if no improvement      Relevant Medications   cefdinir (OMNICEF) 300 MG capsule   methylPREDNISolone (MEDROL) 4 MG tablet    Other Visit Diagnoses    Sinusitis, unspecified chronicity, unspecified location    -  Primary   Relevant Medications   cefdinir (OMNICEF) 300 MG capsule   methylPREDNISolone (MEDROL) 4 MG tablet   Other Relevant Orders   CBC with Differential/Platelet (Completed)      I am having Jamison Neighbor start on cefdinir and methylPREDNISolone.  I am also having her maintain her Multiple Vitamins-Minerals (MULTIVITAMIN ADULT PO), Collagen-Vitamin C (COLLAGEN PLUS VITAMIN C PO), aspirin, Vitamin D (Ergocalciferol),  beclomethasone, citalopram, albuterol, hyoscyamine, metoprolol succinate, lisinopril, promethazine-dextromethorphan, albuterol, and levothyroxine.  Meds ordered this encounter  Medications  . cefdinir (OMNICEF) 300 MG capsule    Sig: Take 1 capsule (300 mg total) by mouth 2 (two) times daily for 10 days.    Dispense:  20 capsule    Refill:  0  . methylPREDNISolone (MEDROL) 4 MG tablet    Sig: 5 tab po qd X 1d then 4 tab po qd X 1d then 3 tab po qd X 1d then 2 tab po qd then 1 tab po qd    Dispense:  15 tablet    Refill:  0     Penni Homans, MD

## 2017-07-03 NOTE — Assessment & Plan Note (Signed)
Well controlled, no changes to meds. Encouraged heart healthy diet such as the DASH diet and exercise as tolerated.  °

## 2017-07-03 NOTE — Assessment & Plan Note (Signed)
Encouraged increased rest and hydration, add probiotics, zinc such as Coldeze or Xicam. Treat fevers as needed. Elderberry, vitamin C start antibiotics if no improvement

## 2017-07-03 NOTE — Assessment & Plan Note (Signed)
Encouraged heart healthy diet, increase exercise, avoid trans fats, consider a krill oil cap daily 

## 2017-07-03 NOTE — Assessment & Plan Note (Signed)
Encouraged daily supplements 

## 2017-07-03 NOTE — Assessment & Plan Note (Signed)
Intermittent and more tolerable. Pancreatic enzymes negative. Report worsening symptoms

## 2017-07-03 NOTE — Assessment & Plan Note (Signed)
On Levothyroxine, continue to monitor 

## 2017-07-05 NOTE — Progress Notes (Deleted)
Subjective:   Julie Wilkerson was seen in consultation in the movement disorder clinic at the request of [name], MD.  The evaluation is for tremor.  Tremor started approximately 5 years ago and involves the R more than L arm, but sometimes it involves the jaw as well.  Tremor is most noticeable when she uses the arms or holds the arms the arms antigravity.   There is no family hx of tremor.    Tremor characteristics: Affected by caffeine:  unknown (doesn't drink any caffeine) Affected by alcohol:  Unknown (doesn't drink any) Affected by stress:  Yes.   Affected by fatigue:  No. Spills soup if on spoon:  May or may not Spills glass of liquid if full:  Has to hold with both hands Affects ADL's (tying shoes, brushing teeth, etc):  No.  Current/Previously tried tremor medications: metoprolol   Current medications that may exacerbate tremor:  symbicort (for very mild asthma - uses it about 3 times a month) - notes much increased tremor with this.  Also c/o L sided headache.  Has had this "since I was a little girl."  Was diagnosed as migraine as child.  Found beef as trigger and started to get better after d/c beef.  About a month ago, it got worse and felt like ice pick in eye.  Going today for eye appt.  Has phophotophobia and phonophobia.  Has nausea.  No emesis.  Takes tylenol or occasionally advil.    Neuroimaging: performed but years ago and she doesn't know results  Outside reports reviewed: historical medical records and office notes.  07/06/17 update: Patient is seen today in follow-up.  I have not seen her since 2017.  At that point in time, I saw her for tremor and headache.  She was started on primidone last visit but never followed back up.  She is not on it now.  She states that ***.  Today, she made an appointment to discuss headache.  Headache is not new for her and she has had this since childhood.  Headaches are located over the left eye.  She has photophobia and  phonophobia.  She had a CT for headache on March 29, 2017.  I reviewed this.  There is atrophy and small vessel disease.  It was nonacute.  Living situation:  Pt {LIVES WITH:5711::"lives with their family"}.  The patient does *** do the finances in the home.  The patient *** drive.   There have *** been any motor vehicle accidents in the recent years.  The patient does *** cook.  There is *** difficulty remembering common recipes.  The stovetop has *** been left on accidentally.  ADL's:  The patient is *** able to perform ***own ADL's. The {med prov:18004}.The patients bladder and bowel are *** under good control.   Behavior:   Family and patient report problems with {alzheimer's behavior:18002}. There have been *** behavioral changes over the years.     No Known Allergies  Outpatient Encounter Medications as of 07/06/2017  Medication Sig  . albuterol (PROVENTIL HFA;VENTOLIN HFA) 108 (90 Base) MCG/ACT inhaler Inhale 2 puffs into the lungs every 4 (four) hours as needed for wheezing or shortness of breath.  Marland Kitchen albuterol (PROVENTIL) (2.5 MG/3ML) 0.083% nebulizer solution Take 3 mLs (2.5 mg total) by nebulization every 6 (six) hours as needed for wheezing or shortness of breath.  Marland Kitchen aspirin 81 MG tablet Take 81 mg by mouth daily.  . beclomethasone (QVAR REDIHALER) 80 MCG/ACT inhaler  Inhale 2 puffs into the lungs 2 (two) times daily.  . cefdinir (OMNICEF) 300 MG capsule Take 1 capsule (300 mg total) by mouth 2 (two) times daily for 10 days.  . citalopram (CELEXA) 20 MG tablet Take 1 tablet (20 mg total) by mouth daily.  . Collagen-Vitamin C (COLLAGEN PLUS VITAMIN C PO) Take 1 tablet by mouth daily.  . hyoscyamine (LEVSIN SL) 0.125 MG SL tablet Place 0.125 mg under the tongue every 4 (four) hours as needed for cramping.  Marland Kitchen levothyroxine (SYNTHROID, LEVOTHROID) 50 MCG tablet TAKE 1 TABLET BY MOUTH ONCE DAILY  . lisinopril (PRINIVIL,ZESTRIL) 20 MG tablet Take 1 tablet (20 mg total) by mouth daily.  .  methylPREDNISolone (MEDROL) 4 MG tablet 5 tab po qd X 1d then 4 tab po qd X 1d then 3 tab po qd X 1d then 2 tab po qd then 1 tab po qd  . metoprolol succinate (TOPROL XL) 25 MG 24 hr tablet Take 0.5 tablets (12.5 mg total) by mouth daily.  . Multiple Vitamins-Minerals (MULTIVITAMIN ADULT PO) Take 1 tablet by mouth daily.  . promethazine-dextromethorphan (PROMETHAZINE-DM) 6.25-15 MG/5ML syrup Take 5 mLs by mouth 4 (four) times daily as needed. (Patient not taking: Reported on 06/27/2017)  . Vitamin D, Ergocalciferol, (DRISDOL) 50000 units CAPS capsule Take 1 capsule (50,000 Units total) by mouth every 7 (seven) days.   No facility-administered encounter medications on file as of 07/06/2017.     Past Medical History:  Diagnosis Date  . Asthma   . Breast cancer ([place])    2002, left, encapsulated, microcalcifications. lumpectomy, radtiation x 30  . Breast cancer in female [facility])   . Change in mole 06/08/2016  . Colitis   . Decreased visual acuity 01/16/2017  . Dyspnea 11/04/2016  . Dysuria 11/04/2016  . Gluten intolerance   . History of chicken pox   . History of Helicobacter pylori infection 08/01/2015  . Hyperlipidemia   . Hypertension   . Hypothyroid   . IBS (irritable bowel syndrome)   . Left leg pain 03/01/2017  . Low back pain 08/10/2015  . Neck pain 03/01/2017  . Osteoporosis   . RLQ discomfort 03/01/2017  . Stomach cramps   . Urinary tract infection 11/04/2016    Past Surgical History:  Procedure Laterality Date  . ABDOMINAL HYSTERECTOMY     partial  . APPENDECTOMY    . BREAST LUMPECTOMY Left 2002  . CHOLECYSTECTOMY  Age 12 or 84  . COLONOSCOPY  2017  . FRACTURE SURGERY    . TONSILLECTOMY      Social History   Socioeconomic History  . Marital status: Single    Spouse name: Not on file  . Number of children: Not on file  . Years of education: Not on file  . Highest education level: Not on file  Social Needs  . Financial resource strain: Not on file  . Food insecurity -  worry: Not on file  . Food insecurity - inability: Not on file  . Transportation needs - medical: Not on file  . Transportation needs - non-medical: Not on file  Occupational History  . Occupation: retired    Comment: Pharmacist, hospital, kindergarten  Tobacco Use  . Smoking status: Never Smoker  . Smokeless tobacco: Never Used  Substance and Sexual Activity  . Alcohol use: No    Alcohol/week: 0.0 oz  . Drug use: No  . Sexual activity: No    Comment: lives alone, avoids daiiry and gluten. 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  Other  Topics Concern  . Not on file  Social History Narrative  . Not on file    Family Status  Relation Name Status  . Mother  Deceased at age 75  . Father  Deceased  . Brother  61, age 27y  . PGM  Deceased  . Brother  Alive, age 53y  . Brother  Alive, age 9y  . Sister  Alive, age 45y  . Daughter  Alive, age 73y  . MGM  Deceased  . MGF  Deceased  . PGF  Deceased  . Daughter 36 Alive  . Daughter 79 Alive    Review of Systems A complete 10 system ROS was obtained and was negative apart from what is mentioned.   Objective:   VITALS:   There were no vitals filed for this visit. Gen:  Appears stated age and in NAD. HEENT:  Normocephalic, atraumatic. The mucous membranes are moist. The superficial temporal arteries are without ropiness or tenderness. Cardiovascular: Bradycardia with regular rhythm. Lungs: Clear to auscultation bilaterally. Neck: There are no carotid bruits noted bilaterally.  NEUROLOGICAL:  Orientation:  The patient is alert and oriented x 3.  Recent and remote memory are intact.  Attention span and concentration are normal.  Able to name objects and repeat without trouble.  Fund of knowledge is appropriate Cranial nerves: There is good facial symmetry. The pupils are equal round and reactive to light bilaterally. Fundoscopic exam reveals clear disc margins bilaterally. Extraocular muscles are intact and visual fields are full to confrontational  testing. Speech is fluent and clear. Soft palate rises symmetrically and there is no tongue deviation. Hearing is intact to conversational tone. Tone: Tone is good throughout. Sensation: Sensation is intact to light touch and pinprick throughout (facial, trunk, extremities). Vibration is intact at the bilateral big toe. There is no extinction with double simultaneous stimulation. There is no sensory dermatomal level identified. Coordination:  The patient has no dysdiadichokinesia or dysmetria. Motor: Strength is 5/5 in the bilateral upper and lower extremities.  Shoulder shrug is equal bilaterally.  There is no pronator drift.  There are no fasciculations noted. DTR's: Deep tendon reflexes are 2+/4 at the bilateral biceps, triceps, brachioradialis, patella and achilles.  Plantar responses are downgoing bilaterally. Gait and Station: The patient arises easily out of the chair.  Gait is a bit antalgic.    MOVEMENT EXAM: Tremor:  There is tremor in the UE, noted most significantly with action.  It is a little worse in the R than the L.  It is moderate in the right.   The patient is has evidence of tremor with Archimedes spirals on the right.  There is no tremor at rest.  There is intermittent chin tremor. The patient is able to pour water from one glass to another without spilling it but she actually has tremor evident but she compensates well.  Labs:  Lab Results  Component Value Date   TSH 2.42 03/01/2017     Chemistry      Component Value Date/Time   NA 135 12/07/2016 0747   K 4.3 12/07/2016 0747   CL 103 12/07/2016 0747   CO2 26 12/07/2016 0747   BUN 31 (H) 12/07/2016 0747   CREATININE 1.05 12/07/2016 0747      Component Value Date/Time   CALCIUM 9.2 12/07/2016 0747   ALKPHOS 49 12/07/2016 0747   AST 21 12/07/2016 0747   ALT 14 12/07/2016 0747   BILITOT 0.6 12/07/2016 0747  Assessment/Plan:   1.  Essential Tremor.  -*** 2.  Migraine without aura, L eye  -long hx of  migraine and this is her typical migraine but has started back after long time of being gone.  Was going to just change the metoprolol to inderal for migraine prophylaxis and tremor but too bradycardic.  Instead, will try short course of medrol and see if just can break headache cycle.  I did tell her that this will cause tremor when she is taking this.  Told her to take this before she starts the primidone.    -will do MRI brain  -if no help, we can discuss other prophylactics or abortives, if needed 3.  ***    CC:  [name], MD

## 2017-07-06 ENCOUNTER — Telehealth: Payer: Self-pay | Admitting: Cardiovascular Disease

## 2017-07-06 ENCOUNTER — Ambulatory Visit: Payer: Medicare HMO | Admitting: Neurology

## 2017-07-06 NOTE — Telephone Encounter (Signed)
Called patient and reviewed Dr. Elmarie Shiley advice with her. She states she saw Dr. Charlett Blake a few weeks ago and will call her for follow-up. I advised her to call back if she needs an appointment or has worsening symptoms. She verbalized understanding and agreement and thanked me for the call.

## 2017-07-06 NOTE — Telephone Encounter (Signed)
Many of her symptoms do not sound like ACS .   In addition she had a negative myoview last year. We could work her in to see an APP next week ( Im in the hospital  All week )  She should also contact her primary md

## 2017-07-06 NOTE — Telephone Encounter (Signed)
Spoke with patient who c/o chest pressure for > 1 month. She states she gets winded very easily. States when she walks a short distance she is very SOB. She was advised at her ov in August to see Dr. Acie Fredrickson in 3 months and she cancelled the appointment due to travel and did not reschedule. She complains of pain when she presses on her sternum and c/o of pressure all over her chest. States she has a hx of asthma and had a respiratory illness in November that was treated with antibiotics. She confirms that her chest pain is worse if she bends forwards. She states she feels better when she is resting; c/o left side pain and numbness in her fingers. Reports highest BP recently has been 140/60 mmHg. States 3 days ago BP 115/49 mmHg, HR 52 bpm; yesterday BP was 123/60 mmHg, HR 54 bpm. States she sleeps well but she feels tired all during the day. States she has a cough that is caused by her asthma, denies fever. Complains of occasional nausea, denies vomiting. I advised I think it is important that she come into the office for evaluation and offered her an appointment with an APP tomorrow. She states she is unavailable all day tomorrow. There are no appointments available for Friday. I advised that I will review with Dr. Acie Fredrickson and call her back with his advice. She thanked me for the call.

## 2017-07-06 NOTE — Telephone Encounter (Signed)
Patient calling,   Patient states that she is having chest pressure and sometimes left arm goes numb. Patient states that she feels "very strange."  Patient states that this has been happening for a month on and off. Patient called for appointment with Dr. Acie Fredrickson and declined to see APP.

## 2017-07-14 ENCOUNTER — Ambulatory Visit: Payer: Medicare HMO | Admitting: Neurology

## 2017-07-15 ENCOUNTER — Encounter: Payer: Self-pay | Admitting: Medical

## 2017-07-15 ENCOUNTER — Ambulatory Visit (INDEPENDENT_AMBULATORY_CARE_PROVIDER_SITE_OTHER): Payer: Medicare HMO | Admitting: Medical

## 2017-07-15 VITALS — BP 123/57 | HR 59 | Temp 97.8°F | Resp 16 | Ht 59.0 in | Wt 124.8 lb

## 2017-07-15 DIAGNOSIS — R1013 Epigastric pain: Secondary | ICD-10-CM

## 2017-07-15 DIAGNOSIS — R110 Nausea: Secondary | ICD-10-CM

## 2017-07-15 DIAGNOSIS — R102 Pelvic and perineal pain: Secondary | ICD-10-CM

## 2017-07-15 LAB — POC URINALSYSI DIPSTICK (AUTOMATED)
BILIRUBIN UA: NEGATIVE
GLUCOSE UA: NEGATIVE
KETONES UA: NEGATIVE
Leukocytes, UA: NEGATIVE
Nitrite, UA: NEGATIVE
Protein, UA: NEGATIVE
RBC UA: NEGATIVE
Spec Grav, UA: 1.025 (ref 1.010–1.025)
UROBILINOGEN UA: NEGATIVE U/dL — AB
pH, UA: 6 (ref 5.0–8.0)

## 2017-07-15 MED ORDER — ONDANSETRON 8 MG PO TBDP: 8.0000 mg | ORAL_TABLET | Freq: Three times a day (TID) | ORAL | 0 refills | Status: DC | PRN

## 2017-07-15 MED ORDER — RANITIDINE HCL 150 MG PO CAPS: 150.0000 mg | ORAL_CAPSULE | Freq: Two times a day (BID) | ORAL | 0 refills | Status: DC

## 2017-07-15 NOTE — Patient Instructions (Signed)
For your recent epigastric pain, nausea and pain in lower abdomen, I want to get CBC, CMP, amylase, lipase and H. pylori breath test.  In addition you gave urine sample today.  Over the weekend to make sure you stay well-hydrated and start to eat bland foods.  I am giving you Zofran tablets to help with nausea or vomiting.  Also I prescribed you ranitidine/Zantac.  I gave you the 150 mg dose.  If you have Zantac at home then try to take 150 mg twice daily.  Follow-up next Tuesday in the morning or as needed.  We will review labs and let you know those results.  Further workup will be determined on lab results and how you do with the above treatment.  If any severe or worsening changing signs or symptoms over the weekend then be seen in the emergency department.

## 2017-07-15 NOTE — Progress Notes (Signed)
Subjective:    Patient ID: Julie Wilkerson, female    DOB: 1941/02/06, 77 y.o.   MRN: [id number]  HPI  Pt in with some abdomen pain for about 3 weeks. Pain in epigastric area and some rt upper quadrant. Pt states this afternoon pain is mild.  However pain level does wax and wane in severity.  Pt is worse after eating. Her appetitie  is decreasing. She is trying to stay hydrated.   She states occasional vomiting.  Not reporting any definite heart burn after eating.  Pt still has the hycosamine.  Pt had gallbadder removed about 30 years ago.  Pt does not drink any alcohol     Review of Systems  Constitutional: Positive for appetite change. Negative for diaphoresis, fatigue and fever.  Respiratory: Negative for cough, choking, chest tightness, shortness of breath and wheezing.   Cardiovascular: Negative for chest pain and palpitations.  Gastrointestinal: Positive for abdominal pain, nausea and vomiting. Negative for abdominal distention, blood in stool, constipation, diarrhea and rectal pain.       She did have some diarrhea but that stopped about one week ago.  Pt is burping some.  Genitourinary: Negative for difficulty urinating, dysuria, flank pain, frequency, genital sores, menstrual problem and urgency.  Musculoskeletal: Negative for back pain.  Neurological: Negative for dizziness and headaches.  Hematological: Negative for adenopathy. Does not bruise/bleed easily.  Psychiatric/Behavioral: Negative for behavioral problems and confusion.   Past Medical History:  Diagnosis Date  . Asthma   . Breast cancer ([place])    2002, left, encapsulated, microcalcifications. lumpectomy, radtiation x 30  . Breast cancer in female [facility])   . Change in mole 06/08/2016  . Colitis   . Decreased visual acuity 01/16/2017  . Dyspnea 11/04/2016  . Dysuria 11/04/2016  . Gluten intolerance   . History of chicken pox   . History of Helicobacter pylori infection 08/01/2015  . Hyperlipidemia   .  Hypertension   . Hypothyroid   . IBS (irritable bowel syndrome)   . Left leg pain 03/01/2017  . Low back pain 08/10/2015  . Neck pain 03/01/2017  . Osteoporosis   . RLQ discomfort 03/01/2017  . Stomach cramps   . Urinary tract infection 11/04/2016     Social History   Socioeconomic History  . Marital status: Single    Spouse name: Not on file  . Number of children: Not on file  . Years of education: Not on file  . Highest education level: Not on file  Social Needs  . Financial resource strain: Not on file  . Food insecurity - worry: Not on file  . Food insecurity - inability: Not on file  . Transportation needs - medical: Not on file  . Transportation needs - non-medical: Not on file  Occupational History  . Occupation: retired    Comment: Pharmacist, hospital, kindergarten  Tobacco Use  . Smoking status: Never Smoker  . Smokeless tobacco: Never Used  Substance and Sexual Activity  . Alcohol use: No    Alcohol/week: 0.0 oz  . Drug use: No  . Sexual activity: No    Comment: lives alone, avoids daiiry and gluten. 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 Not on file  Social History Narrative  . Not on file    Past Surgical History:  Procedure Laterality Date  . ABDOMINAL HYSTERECTOMY     partial  . APPENDECTOMY    . BREAST LUMPECTOMY Left 2002  . CHOLECYSTECTOMY  Age 34 or 85  .  COLONOSCOPY  2017  . FRACTURE SURGERY    . TONSILLECTOMY      Family History  Problem Relation Age of Onset  . Colitis Mother   . Irritable bowel syndrome Mother   . Hypertension Father   . Hyperlipidemia Brother   . Heart attack Brother   . Stomach cancer Paternal Grandmother 79  . Hyperlipidemia Brother   . Heart attack Brother   . Hyperlipidemia Brother   . Heart attack Brother   . Hyperlipidemia Sister   . Leukemia Daughter   . Arthritis Daughter   . Heart disease Daughter        ASD vs VSD    No Known Allergies  Current Outpatient Medications on File Prior to Visit    Medication Sig Dispense Refill  . albuterol (PROVENTIL HFA;VENTOLIN HFA) 108 (90 Base) MCG/ACT inhaler Inhale 2 puffs into the lungs every 4 (four) hours as needed for wheezing or shortness of breath.    Marland Kitchen albuterol (PROVENTIL) (2.5 MG/3ML) 0.083% nebulizer solution Take 3 mLs (2.5 mg total) by nebulization every 6 (six) hours as needed for wheezing or shortness of breath. 150 mL 1  . aspirin 81 MG tablet Take 81 mg by mouth daily.    . beclomethasone (QVAR REDIHALER) 80 MCG/ACT inhaler Inhale 2 puffs into the lungs 2 (two) times daily. 1 Inhaler 0  . citalopram (CELEXA) 20 MG tablet Take 1 tablet (20 mg total) by mouth daily. 90 tablet 1  . Collagen-Vitamin C (COLLAGEN PLUS VITAMIN C PO) Take 1 tablet by mouth daily.    . hyoscyamine (LEVSIN SL) 0.125 MG SL tablet Place 0.125 mg under the tongue every 4 (four) hours as needed for cramping.    Marland Kitchen levothyroxine (SYNTHROID, LEVOTHROID) 50 MCG tablet TAKE 1 TABLET BY MOUTH ONCE DAILY 90 tablet 1  . lisinopril (PRINIVIL,ZESTRIL) 20 MG tablet Take 1 tablet (20 mg total) by mouth daily. 30 tablet 3  . methylPREDNISolone (MEDROL) 4 MG tablet 5 tab po qd X 1d then 4 tab po qd X 1d then 3 tab po qd X 1d then 2 tab po qd then 1 tab po qd 15 tablet 0  . metoprolol succinate (TOPROL XL) 25 MG 24 hr tablet Take 0.5 tablets (12.5 mg total) by mouth daily. 45 tablet 3  . Multiple Vitamins-Minerals (MULTIVITAMIN ADULT PO) Take 1 tablet by mouth daily.    . promethazine-dextromethorphan (PROMETHAZINE-DM) 6.25-15 MG/5ML syrup Take 5 mLs by mouth 4 (four) times daily as needed. 118 mL 0  . Vitamin D, Ergocalciferol, (DRISDOL) 50000 units CAPS capsule Take 1 capsule (50,000 Units total) by mouth every 7 (seven) days. 12 capsule 1   No current facility-administered medications on file prior to visit.     BP (!) 123/57   Pulse (!) 59   Temp 97.8 F (36.6 C) (Oral)   Resp 16   Ht 4\' 11"  (1.499 m)   Wt 124 lb 12.8 oz (56.6 kg)   SpO2 98%   BMI 25.21 kg/m        Objective:   Physical Exam  General Appearance- Not in acute distress.  HEENT Eyes- Scleraeral/Conjuntiva-bilat- Not Yellow. Mouth & Throat- Normal.  Chest and Lung Exam Auscultation: Breath sounds:-Normal. Adventitious sounds:- No Adventitious sounds.  Cardiovascular Auscultation:Rythm - Regular. Heart Sounds -Normal heart sounds.  Abdomen Inspection:-Inspection Normal.  Palpation/Perucssion: Palpation and Percussion of the abdomen reveal- Non Tender, No Rebound tenderness, No rigidity(Guarding) and No Palpable abdominal masses.  Liver:-Normal.  Spleen:- Normal.   Back- no  cva tendereness      Assessment & Plan:  For your recent epigastric pain, nausea and pain in lower abdomen, I want to get CBC, CMP, amylase, lipase and H. pylori breath test.  In addition you gave urine sample today.  Over the weekend to make sure you stay well-hydrated and start to eat bland foods.  I am giving you Zofran tablets to help with nausea or vomiting.  Also I prescribed you ranitidine/Zantac.  I gave you the 150 mg dose.  If you have Zantac at home then try to take 150 mg twice daily.  Follow-up next Tuesday in the morning or as needed.  We will review labs and let you know those results.  Further workup will be determined on lab results and how you do with the above treatment.  If any severe or worsening changing signs or symptoms over the weekend then be seen in the emergency department.  Mackie Pai, PA-C

## 2017-07-16 LAB — CBC WITH DIFFERENTIAL/PLATELET
Basophils Absolute: 0 cells/uL (ref 0–200)
Basophils Relative: 0 %
EOS ABS: 0 {cells}/uL — AB (ref 15–500)
Eosinophils Relative: 0 %
HEMATOCRIT: 34.6 % — AB (ref 35.0–45.0)
Hemoglobin: 11.6 g/dL — ABNORMAL LOW (ref 11.7–15.5)
LYMPHS ABS: 2195 {cells}/uL (ref 850–3900)
MCH: 29.7 pg (ref 27.0–33.0)
MCHC: 33.5 g/dL (ref 32.0–36.0)
MCV: 88.7 fL (ref 80.0–100.0)
MPV: 12 fL (ref 7.5–12.5)
Monocytes Relative: 13.6 %
NEUTROS PCT: 49.2 %
Neutro Abs: 2903 cells/uL (ref 1500–7800)
PLATELETS: 232 10*3/uL (ref 140–400)
RBC: 3.9 10*6/uL (ref 3.80–5.10)
RDW: 13.6 % (ref 11.0–15.0)
TOTAL LYMPHOCYTE: 37.2 %
WBC: 5.9 10*3/uL (ref 3.8–10.8)
WBCMIX: 802 {cells}/uL (ref 200–950)

## 2017-07-16 LAB — COMPREHENSIVE METABOLIC PANEL
AG Ratio: 1.7 (calc) (ref 1.0–2.5)
ALKALINE PHOSPHATASE (APISO): 49 U/L (ref 33–130)
ALT: 15 U/L (ref 6–29)
AST: 21 U/L (ref 10–35)
Albumin: 4.3 g/dL (ref 3.6–5.1)
BUN / CREAT RATIO: 18 (calc) (ref 6–22)
BUN: 22 mg/dL (ref 7–25)
CHLORIDE: 102 mmol/L (ref 98–110)
CO2: 26 mmol/L (ref 20–32)
Calcium: 9.6 mg/dL (ref 8.6–10.4)
Creat: 1.2 mg/dL — ABNORMAL HIGH (ref 0.60–0.93)
GLUCOSE: 97 mg/dL (ref 65–99)
Globulin: 2.5 g/dL (calc) (ref 1.9–3.7)
POTASSIUM: 4.8 mmol/L (ref 3.5–5.3)
Sodium: 136 mmol/L (ref 135–146)
TOTAL PROTEIN: 6.8 g/dL (ref 6.1–8.1)
Total Bilirubin: 0.5 mg/dL (ref 0.2–1.2)

## 2017-07-16 LAB — AMYLASE: AMYLASE: 46 U/L (ref 21–101)

## 2017-07-16 LAB — LIPASE: Lipase: 21 U/L (ref 7–60)

## 2017-07-18 LAB — H. PYLORI BREATH TEST: H. pylori Breath Test: NOT DETECTED

## 2017-07-19 ENCOUNTER — Emergency Department (HOSPITAL_BASED_OUTPATIENT_CLINIC_OR_DEPARTMENT_OTHER): Payer: Medicare HMO

## 2017-07-19 ENCOUNTER — Ambulatory Visit (INDEPENDENT_AMBULATORY_CARE_PROVIDER_SITE_OTHER): Payer: Medicare HMO | Admitting: Medical

## 2017-07-19 ENCOUNTER — Encounter: Payer: Self-pay | Admitting: Medical

## 2017-07-19 ENCOUNTER — Encounter (HOSPITAL_BASED_OUTPATIENT_CLINIC_OR_DEPARTMENT_OTHER): Payer: Self-pay | Admitting: *Deleted

## 2017-07-19 ENCOUNTER — Emergency Department (HOSPITAL_BASED_OUTPATIENT_CLINIC_OR_DEPARTMENT_OTHER)
Admission: EM | Admit: 2017-07-19 | Discharge: 2017-07-19 | Disposition: A | Discharge status: Home / Self Care | Payer: Medicare HMO | Attending: Emergency Medicine | Admitting: Emergency Medicine

## 2017-07-19 ENCOUNTER — Other Ambulatory Visit: Payer: Self-pay

## 2017-07-19 VITALS — BP 118/59 | HR 53 | Temp 97.5°F | Resp 16 | Ht 59.0 in | Wt 125.6 lb

## 2017-07-19 DIAGNOSIS — J45909 Unspecified asthma, uncomplicated: Secondary | ICD-10-CM | POA: Insufficient documentation

## 2017-07-19 DIAGNOSIS — E039 Hypothyroidism, unspecified: Secondary | ICD-10-CM | POA: Insufficient documentation

## 2017-07-19 DIAGNOSIS — G8929 Other chronic pain: Secondary | ICD-10-CM | POA: Diagnosis not present

## 2017-07-19 DIAGNOSIS — Z7982 Long term (current) use of aspirin: Secondary | ICD-10-CM | POA: Diagnosis not present

## 2017-07-19 DIAGNOSIS — Z79899 Other long term (current) drug therapy: Secondary | ICD-10-CM | POA: Diagnosis not present

## 2017-07-19 DIAGNOSIS — I10 Essential (primary) hypertension: Secondary | ICD-10-CM | POA: Diagnosis not present

## 2017-07-19 DIAGNOSIS — R109 Unspecified abdominal pain: Secondary | ICD-10-CM

## 2017-07-19 DIAGNOSIS — R1084 Generalized abdominal pain: Secondary | ICD-10-CM

## 2017-07-19 DIAGNOSIS — R1031 Right lower quadrant pain: Secondary | ICD-10-CM | POA: Diagnosis not present

## 2017-07-19 DIAGNOSIS — R001 Bradycardia, unspecified: Secondary | ICD-10-CM | POA: Diagnosis not present

## 2017-07-19 DIAGNOSIS — R1011 Right upper quadrant pain: Secondary | ICD-10-CM | POA: Diagnosis not present

## 2017-07-19 LAB — URINALYSIS, ROUTINE W REFLEX MICROSCOPIC
Bilirubin Urine: NEGATIVE
Glucose, UA: NEGATIVE mg/dL
HGB URINE DIPSTICK: NEGATIVE
Ketones, ur: NEGATIVE mg/dL
Leukocytes, UA: NEGATIVE
Nitrite: NEGATIVE
PH: 6 (ref 5.0–8.0)
Protein, ur: NEGATIVE mg/dL
SPECIFIC GRAVITY, URINE: 1.01 (ref 1.005–1.030)

## 2017-07-19 LAB — LIPASE, BLOOD: LIPASE: 30 U/L (ref 11–51)

## 2017-07-19 LAB — COMPREHENSIVE METABOLIC PANEL
ALK PHOS: 46 U/L (ref 38–126)
ALT: 18 U/L (ref 14–54)
AST: 26 U/L (ref 15–41)
Albumin: 3.6 g/dL (ref 3.5–5.0)
Anion gap: 7 (ref 5–15)
BUN: 26 mg/dL — AB (ref 6–20)
CALCIUM: 8.1 mg/dL — AB (ref 8.9–10.3)
CHLORIDE: 103 mmol/L (ref 101–111)
CO2: 23 mmol/L (ref 22–32)
Creatinine, Ser: 0.95 mg/dL (ref 0.44–1.00)
GFR calc Af Amer: >60 mL/min (ref >60)
GFR, EST NON AFRICAN AMERICAN: 57 mL/min — AB (ref >60)
Glucose, Bld: 88 mg/dL (ref 65–99)
Potassium: 3.8 mmol/L (ref 3.5–5.1)
Sodium: 133 mmol/L — ABNORMAL LOW (ref 135–145)
Total Bilirubin: 0.5 mg/dL (ref 0.3–1.2)
Total Protein: 6.2 g/dL — ABNORMAL LOW (ref 6.5–8.1)

## 2017-07-19 LAB — POC URINALSYSI DIPSTICK (AUTOMATED)
BILIRUBIN UA: NEGATIVE
GLUCOSE UA: NEGATIVE
KETONES UA: NEGATIVE
Leukocytes, UA: NEGATIVE
Nitrite, UA: NEGATIVE
Protein, UA: NEGATIVE
RBC UA: NEGATIVE
SPEC GRAV UA: 1.02 (ref 1.010–1.025)
UROBILINOGEN UA: NEGATIVE U/dL — AB
pH, UA: 6 (ref 5.0–8.0)

## 2017-07-19 LAB — CBC WITH DIFFERENTIAL/PLATELET
Basophils Absolute: 0 10*3/uL (ref 0.0–0.1)
Basophils Relative: 0 %
EOS ABS: 0 10*3/uL (ref 0.0–0.7)
Eosinophils Relative: 0 %
HEMATOCRIT: 34.4 % — AB (ref 36.0–46.0)
HEMOGLOBIN: 11.5 g/dL — AB (ref 12.0–15.0)
LYMPHS ABS: 2.3 10*3/uL (ref 0.7–4.0)
LYMPHS PCT: 37 %
MCH: 30.3 pg (ref 26.0–34.0)
MCHC: 33.4 g/dL (ref 30.0–36.0)
MCV: 90.8 fL (ref 78.0–100.0)
MONOS PCT: 13 %
Monocytes Absolute: 0.8 10*3/uL (ref 0.1–1.0)
NEUTROS ABS: 3.1 10*3/uL (ref 1.7–7.7)
NEUTROS PCT: 50 %
Platelets: 219 10*3/uL (ref 150–400)
RBC: 3.79 MIL/uL — AB (ref 3.87–5.11)
RDW: 14.7 % (ref 11.5–15.5)
WBC: 6.3 10*3/uL (ref 4.0–10.5)

## 2017-07-19 LAB — TROPONIN I

## 2017-07-19 MED ORDER — IOPAMIDOL (ISOVUE-300) INJECTION 61%
100.0000 mL | Freq: Once | INTRAVENOUS | Status: AC | PRN
  Administered 2017-07-19: 100 mL via INTRAVENOUS

## 2017-07-19 MED ORDER — SODIUM CHLORIDE 0.9 % IV BOLUS (SEPSIS)
1000.0000 mL | Freq: Once | INTRAVENOUS | Status: AC
  Administered 2017-07-19: 1000 mL via INTRAVENOUS

## 2017-07-19 MED ORDER — ONDANSETRON HCL 4 MG/2ML IJ SOLN: 4.0000 mg | Freq: Once | INTRAMUSCULAR | Status: DC

## 2017-07-19 MED ORDER — MORPHINE SULFATE (PF) 4 MG/ML IV SOLN: 4.0000 mg | Freq: Once | INTRAVENOUS | Status: DC

## 2017-07-19 MED ORDER — IOPAMIDOL (ISOVUE-300) INJECTION 61%: 100.0000 mL | Freq: Once | INTRAVENOUS | Status: DC | PRN

## 2017-07-19 NOTE — Progress Notes (Signed)
Subjective:    Patient ID: Julie Wilkerson, female    DOB: 27-Nov-1940, 77 y.o.   MRN: [id number]  HPI  Pt in for follow up.  She states pain is more severe than last time. Pain is mostly in rt flank area/rlq pain. Pain is more severe on walking.   Pain level is 6/10 at rest.  But with simple movements her pain increases to almost intolerable.  When walks pain is severe. Pt has no appendix , no gallbladder and had hysterectomy.  Pt last bowel movement was yesterday. No fever or sweats. She feels cold all the time.  Pain now for about 4 weeks and getting worse. Pt is on hycosamine. I gave her zantac and Zofran.  This did not help at all.  Pt urine is clear today and was clear last time.   Amylase, lipase, cbc and cmp were ok the other day.   Review of Systems  Constitutional: Negative for chills, fatigue and fever.  Respiratory: Negative for cough, chest tightness, shortness of breath and wheezing.   Cardiovascular: Negative for chest pain and palpitations.  Gastrointestinal: Positive for abdominal pain. Negative for abdominal distention, blood in stool, constipation, diarrhea, nausea, rectal pain and vomiting.       Severe.  Genitourinary: Negative for difficulty urinating, dysuria, frequency, genital sores, menstrual problem, pelvic pain and vaginal bleeding.  Musculoskeletal: Negative for back pain, joint swelling, myalgias and neck pain.       Some rt hip pain.  Skin: Negative for rash.  Neurological: Negative for dizziness, speech difficulty, weakness, light-headedness and headaches.  Hematological: Negative for adenopathy. Does not bruise/bleed easily.  Psychiatric/Behavioral: Negative for behavioral problems and confusion.   Past Medical History:  Diagnosis Date  . Asthma   . Breast cancer ([place])    2002, left, encapsulated, microcalcifications. lumpectomy, radtiation x 30  . Breast cancer in female [facility])   . Change in mole 06/08/2016  . Colitis   . Decreased visual acuity  01/16/2017  . Dyspnea 11/04/2016  . Dysuria 11/04/2016  . Gluten intolerance   . History of chicken pox   . History of Helicobacter pylori infection 08/01/2015  . Hyperlipidemia   . Hypertension   . Hypothyroid   . IBS (irritable bowel syndrome)   . Left leg pain 03/01/2017  . Low back pain 08/10/2015  . Neck pain 03/01/2017  . Osteoporosis   . RLQ discomfort 03/01/2017  . Stomach cramps   . Urinary tract infection 11/04/2016     Social History   Socioeconomic History  . Marital status: Single    Spouse name: Not on file  . Number of children: Not on file  . Years of education: Not on file  . Highest education level: Not on file  Social Needs  . Financial resource strain: Not on file  . Food insecurity - worry: Not on file  . Food insecurity - inability: Not on file  . Transportation needs - medical: Not on file  . Transportation needs - non-medical: Not on file  Occupational History  . Occupation: retired    Comment: Pharmacist, hospital, kindergarten  Tobacco Use  . Smoking status: Never Smoker  . Smokeless tobacco: Never Used  Substance and Sexual Activity  . Alcohol use: No    Alcohol/week: 0.0 oz  . Drug use: No  . Sexual activity: No    Comment: lives alone, avoids daiiry and gluten. 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 Not on file  Social History Narrative  .  Not on file    Past Surgical History:  Procedure Laterality Date  . ABDOMINAL HYSTERECTOMY     partial  . APPENDECTOMY    . BREAST LUMPECTOMY Left 2002  . CHOLECYSTECTOMY  Age 62 or 41  . COLONOSCOPY  2017  . FRACTURE SURGERY    . TONSILLECTOMY      Family History  Problem Relation Age of Onset  . Colitis Mother   . Irritable bowel syndrome Mother   . Hypertension Father   . Hyperlipidemia Brother   . Heart attack Brother   . Stomach cancer Paternal Grandmother 72  . Hyperlipidemia Brother   . Heart attack Brother   . Hyperlipidemia Brother   . Heart attack Brother   . Hyperlipidemia  Sister   . Leukemia Daughter   . Arthritis Daughter   . Heart disease Daughter        ASD vs VSD    No Known Allergies  Current Outpatient Medications on File Prior to Visit  Medication Sig Dispense Refill  . albuterol (PROVENTIL HFA;VENTOLIN HFA) 108 (90 Base) MCG/ACT inhaler Inhale 2 puffs into the lungs every 4 (four) hours as needed for wheezing or shortness of breath.    Marland Kitchen albuterol (PROVENTIL) (2.5 MG/3ML) 0.083% nebulizer solution Take 3 mLs (2.5 mg total) by nebulization every 6 (six) hours as needed for wheezing or shortness of breath. 150 mL 1  . aspirin 81 MG tablet Take 81 mg by mouth daily.    . beclomethasone (QVAR REDIHALER) 80 MCG/ACT inhaler Inhale 2 puffs into the lungs 2 (two) times daily. 1 Inhaler 0  . citalopram (CELEXA) 20 MG tablet Take 1 tablet (20 mg total) by mouth daily. 90 tablet 1  . Collagen-Vitamin C (COLLAGEN PLUS VITAMIN C PO) Take 1 tablet by mouth daily.    . hyoscyamine (LEVSIN SL) 0.125 MG SL tablet Place 0.125 mg under the tongue every 4 (four) hours as needed for cramping.    Marland Kitchen levothyroxine (SYNTHROID, LEVOTHROID) 50 MCG tablet TAKE 1 TABLET BY MOUTH ONCE DAILY 90 tablet 1  . lisinopril (PRINIVIL,ZESTRIL) 20 MG tablet Take 1 tablet (20 mg total) by mouth daily. 30 tablet 3  . methylPREDNISolone (MEDROL) 4 MG tablet 5 tab po qd X 1d then 4 tab po qd X 1d then 3 tab po qd X 1d then 2 tab po qd then 1 tab po qd 15 tablet 0  . metoprolol succinate (TOPROL XL) 25 MG 24 hr tablet Take 0.5 tablets (12.5 mg total) by mouth daily. 45 tablet 3  . Multiple Vitamins-Minerals (MULTIVITAMIN ADULT PO) Take 1 tablet by mouth daily.    . ondansetron (ZOFRAN ODT) 8 MG disintegrating tablet Take 1 tablet (8 mg total) by mouth every 8 (eight) hours as needed for nausea or vomiting. 20 tablet 0  . promethazine-dextromethorphan (PROMETHAZINE-DM) 6.25-15 MG/5ML syrup Take 5 mLs by mouth 4 (four) times daily as needed. 118 mL 0  . ranitidine (ZANTAC) 150 MG capsule Take 1  capsule (150 mg total) by mouth 2 (two) times daily. 60 capsule 0  . Vitamin D, Ergocalciferol, (DRISDOL) 50000 units CAPS capsule Take 1 capsule (50,000 Units total) by mouth every 7 (seven) days. 12 capsule 1   No current facility-administered medications on file prior to visit.     BP (!) 118/59   Pulse (!) 53   Temp (!) 97.5 F (36.4 C) (Oral)   Resp 16   Ht 4\' 11"  (1.499 m)   Wt 125 lb 9.6 oz (57  kg)   SpO2 97%   BMI 25.37 kg/m       Objective:   Physical Exam  General Appearance- Not in acute distress.  HEENT Eyes- Scleraeral/Conjuntiva-bilat- Not Yellow. Mouth & Throat- Normal.  Chest and Lung Exam Auscultation: Breath sounds:-Normal. Adventitious sounds:- No Adventitious sounds.  Cardiovascular Auscultation:Rythm - Regular. Heart Sounds -Normal heart sounds.  Abdomen Inspection:-Inspection Normal.  Palpation/Perucssion: Palpation and Percussion of the abdomen reveal- rt lower quadrant/adnexal region pain severe/rebound, No Rebound tenderness, No rigidity(Guarding) and No Palpable abdominal masses.  Liver:-Normal.  Spleen:- Normal.   Back- faint bilateral cva tenderness.  Rt hip- mild pain on palpation.        Assessment & Plan:  For your severe/high level abdomen pain will refer you down stairs for stat work up.  With your severe pain I do think it is best to be evaluated in the emergency department.  Dr. Charlett Blake agrees as well.  I did talk with emergency department physician and let them know you are on the way downstairs.  Follow-up date to be determined after ED evaluation.   Mackie Pai, PA-C

## 2017-07-19 NOTE — ED Triage Notes (Addendum)
Right upper quad pain for a week. Worsened pain x 2 days. She had her gallbladder out years ago. She was sent from there MD's office after they examined her. She had a urinalysis in her MD's office today.

## 2017-07-19 NOTE — Patient Instructions (Signed)
For your severe/high level abdomen pain will refer you down stairs for stat work up.  With your severe pain I do think it is best to be evaluated in the emergency department.  Dr. Charlett Blake agrees as well.  I did talk with emergency department physician and let them know you are on the way downstairs.  Follow-up date to be determined after ED evaluation.

## 2017-07-19 NOTE — ED Provider Notes (Signed)
Received this patient in signout from [place]. We were awaiting CT results, which showed no acute findings to explain pain. Mild fulness of ureter but no stones, UA shows no signs of infection or blood. Pt well appearing and comfortable on reassessment. Given reassuring work up and chronicity of sx, I feel she is safe for discharge with PCP follow-up and consideration of referral to GI if symptoms continue.  Extensively reviewed return precautions.  Patient voiced understanding.   Little, Wenda Over[place], MD 07/19/17 1743

## 2017-07-19 NOTE — ED Notes (Signed)
Patient transported to CT 

## 2017-07-19 NOTE — ED Provider Notes (Signed)
[place] EMERGENCY DEPARTMENT Provider Note   CSN: [id number] Arrival date & time: 07/19/17  1212     History   Chief Complaint Chief Complaint  Patient presents with  . Abdominal Pain    HPI Dannika Hilgeman is a 77 y.o. female history of hypertension, previous cholecystectomy and appendectomy, UTI, here presenting with right upper quadrant pain, distention.  Patient had abdominal pain for the last several weeks.  Patient was seen at the primary care doctor office about 3 weeks ago and had labs that were is unremarkable.  About 3-4 days ago, the pain was worse so she went back to the office and had normal CBC, CMP and lipase.  Over the last 2 days, patient had worsening pain in the right upper quadrant and felt nauseated.  Patient did have a normal bowel movement today but felt that her abdomen is more distended. Went to PCP again and was noted to have RLQ and RUQ tenderness and sent for evaluation.   The history is provided by the patient.    Past Medical History:  Diagnosis Date  . Asthma   . Breast cancer ([place])    2002, left, encapsulated, microcalcifications. lumpectomy, radtiation x 30  . Breast cancer in female [facility])   . Change in mole 06/08/2016  . Colitis   . Decreased visual acuity 01/16/2017  . Dyspnea 11/04/2016  . Dysuria 11/04/2016  . Gluten intolerance   . History of chicken pox   . History of Helicobacter pylori infection 08/01/2015  . Hyperlipidemia   . Hypertension   . Hypothyroid   . IBS (irritable bowel syndrome)   . Left leg pain 03/01/2017  . Low back pain 08/10/2015  . Neck pain 03/01/2017  . Osteoporosis   . RLQ discomfort 03/01/2017  . Stomach cramps   . Urinary tract infection 11/04/2016    Patient Active Problem List   Diagnosis Date Noted  . Bronchitis 04/26/2017  . Acute pansinusitis 04/26/2017  . Memory loss 03/28/2017  . Bilateral carotid bruits 03/28/2017  . Nonintractable headache 03/28/2017  . Left leg pain 03/01/2017  . Neck  pain 03/01/2017  . RLQ discomfort 03/01/2017  . Decreased visual acuity 01/16/2017  . 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 Hyperglycemia 11/07/2016  . 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 Abdominal pain 11/07/2016  . Urinary tract infection 11/04/2016  . Change in mole 06/08/2016  . 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 Vitamin D deficiency 04/29/2016  . Tremor 02/15/2016  . Low back pain 08/10/2015  . History of Helicobacter pylori infection 08/01/2015  . Cough variant asthma  vs UACS from ACEi    . Breast cancer in female [facility])   . Hyperlipidemia   . Essential hypertension   . Osteoporosis   . Hypothyroid   . IBS (irritable bowel syndrome)   . Gluten intolerance   . History of chicken pox     Past Surgical History:  Procedure Laterality Date  . ABDOMINAL HYSTERECTOMY     partial  . APPENDECTOMY    . BREAST LUMPECTOMY Left 2002  . CHOLECYSTECTOMY  Age 13 or 89  . COLONOSCOPY  2017  . FRACTURE SURGERY    . TONSILLECTOMY      OB History    No data available       Home Medications    Prior to Admission medications   Medication Sig Start Date End Date Taking? Authorizing Provider  albuterol (PROVENTIL HFA;VENTOLIN HFA) 108 (90 Base)  MCG/ACT inhaler Inhale 2 puffs into the lungs every 4 (four) hours as needed for wheezing or shortness of breath.    [provider]  albuterol (PROVENTIL) (2.5 MG/3ML) 0.083% nebulizer solution Take 3 mLs (2.5 mg total) by nebulization every 6 (six) hours as needed for wheezing or shortness of breath. 04/26/17   Ann Held, DO  aspirin 81 MG tablet Take 81 mg by mouth daily.    [provider]  beclomethasone (QVAR REDIHALER) 80 MCG/ACT inhaler Inhale 2 puffs into the lungs 2 (two) times daily. 12/02/16   Tanda Rockers, MD  citalopram (CELEXA) 20 MG tablet Take 1 tablet (20 mg total) by mouth daily. 01/10/17   [name], MD  Collagen-Vitamin C (COLLAGEN PLUS  VITAMIN C PO) Take 1 tablet by mouth daily.    [provider]  hyoscyamine (LEVSIN SL) 0.125 MG SL tablet Place 0.125 mg under the tongue every 4 (four) hours as needed for cramping.    [provider]  levothyroxine (SYNTHROID, LEVOTHROID) 50 MCG tablet TAKE 1 TABLET BY MOUTH ONCE DAILY 06/13/17   [name], MD  lisinopril (PRINIVIL,ZESTRIL) 20 MG tablet Take 1 tablet (20 mg total) by mouth daily. 03/01/17   [name], MD  methylPREDNISolone (MEDROL) 4 MG tablet 5 tab po qd X 1d then 4 tab po qd X 1d then 3 tab po qd X 1d then 2 tab po qd then 1 tab po qd 06/27/17   [name], MD  metoprolol succinate (TOPROL XL) 25 MG 24 hr tablet Take 0.5 tablets (12.5 mg total) by mouth daily. 01/13/17   Nahser, Wonda Cheng, MD  Multiple Vitamins-Minerals (MULTIVITAMIN ADULT PO) Take 1 tablet by mouth daily.    [provider]  ondansetron (ZOFRAN ODT) 8 MG disintegrating tablet Take 1 tablet (8 mg total) by mouth every 8 (eight) hours as needed for nausea or vomiting. 07/15/17   Saguier, Percell Miller, PA-C  promethazine-dextromethorphan (PROMETHAZINE-DM) 6.25-15 MG/5ML syrup Take 5 mLs by mouth 4 (four) times daily as needed. 04/26/17   Ann Held, DO  ranitidine (ZANTAC) 150 MG capsule Take 1 capsule (150 mg total) by mouth 2 (two) times daily. 07/15/17   Saguier, Percell Miller, PA-C  Vitamin D, Ergocalciferol, (DRISDOL) 50000 units CAPS capsule Take 1 capsule (50,000 Units total) by mouth every 7 (seven) days. 11/04/16   [name], MD    Family History Family History  Problem Relation Age of Onset  . Colitis Mother   . Irritable bowel syndrome Mother   . Hypertension Father   . Hyperlipidemia Brother   . Heart attack Brother   . Stomach cancer Paternal Grandmother 1  . Hyperlipidemia Brother   . Heart attack Brother   . Hyperlipidemia Brother   . Heart attack Brother   . Hyperlipidemia Sister   . Leukemia Daughter   . Arthritis Daughter   . Heart disease  Daughter        ASD vs VSD    Social History Social History   Tobacco Use  . Smoking status: Never Smoker  . Smokeless tobacco: Never Used  Substance Use Topics  . Alcohol use: No    Alcohol/week: 0.0 oz  . Drug use: No     Allergies   Patient has no known allergies.   Review of Systems Review of Systems  Gastrointestinal: Positive for abdominal pain.  All other systems reviewed and are negative.    Physical Exam Updated Vital Signs BP  124/61   Pulse (!) 50   Temp 97.7 F (36.5 C) (Oral)   Resp 16   Ht 4\' 11"  (1.499 m)   Wt 56.7 kg (125 lb)   SpO2 99%   BMI 25.25 kg/m   Physical Exam  Constitutional: She appears well-developed and well-nourished.  HENT:  Head: Normocephalic.  Eyes: EOM are normal. Pupils are equal, round, and reactive to light.  Cardiovascular: Normal rate.  Pulmonary/Chest: Effort normal.  Abdominal:  Mildly distended, previous cholecystectomy scar healing well. Mild RUQ tenderness and RLQ tenderness, no rebound. No CVAT   Neurological: She is alert.  Skin: Skin is warm.  Psychiatric: She has a normal mood and affect.  Nursing note and vitals reviewed.    ED Treatments / Results  Labs (all labs ordered are listed, but only abnormal results are displayed) Labs Reviewed  CBC WITH DIFFERENTIAL/PLATELET - Abnormal; Notable for the following components:      Result Value   RBC 3.79 (*)    Hemoglobin 11.5 (*)    HCT 34.4 (*)    All other components within normal limits  COMPREHENSIVE METABOLIC PANEL - Abnormal; Notable for the following components:   Sodium 133 (*)    BUN 26 (*)    Calcium 8.1 (*)    Total Protein 6.2 (*)    GFR calc non Af Amer 57 (*)    All other components within normal limits  URINALYSIS, ROUTINE W REFLEX MICROSCOPIC  TROPONIN I  LIPASE, BLOOD    EKG  EKG Interpretation  Date/Time:  Tuesday July 19 2017 13:54:52 EST Ventricular Rate:  49 PR Interval:    QRS Duration: 86 QT Interval:  429 QTC  Calculation: 388 R Axis:   24 Text Interpretation:  Sinus bradycardia Borderline T wave abnormalities No previous ECGs available Confirmed by Wandra Arthurs [phone]) on 07/19/2017 2:11:16 PM       Radiology No results found.  Procedures Procedures (including critical care time)  Medications Ordered in ED Medications  morphine 4 MG/ML injection 4 mg (4 mg Intravenous Not Given 07/19/17 1518)  ondansetron (ZOFRAN) injection 4 mg (4 mg Intravenous Not Given 07/19/17 1519)  iopamidol (ISOVUE-300) 61 % injection 100 mL (not administered)  sodium chloride 0.9 % bolus 1,000 mL (0 mLs Intravenous Stopped 07/19/17 1537)  iopamidol (ISOVUE-300) 61 % injection 100 mL (100 mLs Intravenous Contrast Given 07/19/17 1540)     Initial Impression / Assessment and Plan / ED Course  I have reviewed the triage vital signs and the nursing notes.  Pertinent labs & imaging results that were available during my care of the patient were reviewed by me and considered in my medical decision making (see chart for details).     Sherrice Creekmore is a 77 y.o. female here with abdominal pain, distention. Has hx of cholecystectomy, appendectomy. Consider partial SBO vs pancreatitis. UA in the office is normal. Will repeat labs, UA. Will get CT ab/pel.   3:47 PM Labs and UA unremarkable. CT ab/pel pending. Anticipate dc home with CT unremarkable. Signed out to Dr. Rex Kras.   Final Clinical Impressions(s) / ED Diagnoses   Final diagnoses:  None    ED Discharge Orders    None       Drenda Freeze, MD 07/19/17 [phone]

## 2017-08-02 ENCOUNTER — Telehealth: Payer: Self-pay | Admitting: Gastroenterology

## 2017-08-02 ENCOUNTER — Encounter: Payer: Self-pay | Admitting: Family Medicine

## 2017-08-02 ENCOUNTER — Ambulatory Visit (INDEPENDENT_AMBULATORY_CARE_PROVIDER_SITE_OTHER): Payer: Medicare HMO | Admitting: Family Medicine

## 2017-08-02 VITALS — BP 120/58 | HR 55 | Temp 97.7°F | Resp 18 | Wt 125.6 lb

## 2017-08-02 DIAGNOSIS — R109 Unspecified abdominal pain: Secondary | ICD-10-CM | POA: Diagnosis not present

## 2017-08-02 DIAGNOSIS — N390 Urinary tract infection, site not specified: Secondary | ICD-10-CM | POA: Diagnosis not present

## 2017-08-02 DIAGNOSIS — E039 Hypothyroidism, unspecified: Secondary | ICD-10-CM | POA: Diagnosis not present

## 2017-08-02 DIAGNOSIS — R194 Change in bowel habit: Secondary | ICD-10-CM | POA: Diagnosis not present

## 2017-08-02 DIAGNOSIS — R739 Hyperglycemia, unspecified: Secondary | ICD-10-CM

## 2017-08-02 DIAGNOSIS — E559 Vitamin D deficiency, unspecified: Secondary | ICD-10-CM | POA: Diagnosis not present

## 2017-08-02 DIAGNOSIS — I10 Essential (primary) hypertension: Secondary | ICD-10-CM

## 2017-08-02 LAB — CBC WITH DIFFERENTIAL/PLATELET
Basophils Absolute: 0 10*3/uL (ref 0.0–0.1)
Basophils Relative: 0.3 % (ref 0.0–3.0)
EOS ABS: 0 10*3/uL (ref 0.0–0.7)
Eosinophils Relative: 0 % (ref 0.0–5.0)
HEMATOCRIT: 33.5 % — AB (ref 36.0–46.0)
HEMOGLOBIN: 11.4 g/dL — AB (ref 12.0–15.0)
LYMPHS PCT: 33.3 % (ref 12.0–46.0)
Lymphs Abs: 1.5 10*3/uL (ref 0.7–4.0)
MCHC: 34.1 g/dL (ref 30.0–36.0)
MCV: 90.5 fl (ref 78.0–100.0)
MONOS PCT: 12.2 % — AB (ref 3.0–12.0)
Monocytes Absolute: 0.6 10*3/uL (ref 0.1–1.0)
NEUTROS ABS: 2.5 10*3/uL (ref 1.4–7.7)
Neutrophils Relative %: 54.2 % (ref 43.0–77.0)
PLATELETS: 221 10*3/uL (ref 150.0–400.0)
RBC: 3.7 Mil/uL — ABNORMAL LOW (ref 3.87–5.11)
RDW: 15 % (ref 11.5–15.5)
WBC: 4.6 10*3/uL (ref 4.0–10.5)

## 2017-08-02 LAB — URINALYSIS
Bilirubin Urine: NEGATIVE
HGB URINE DIPSTICK: NEGATIVE
KETONES UR: NEGATIVE
Leukocytes, UA: NEGATIVE
Nitrite: NEGATIVE
Specific Gravity, Urine: 1.01 (ref 1.000–1.030)
TOTAL PROTEIN, URINE-UPE24: NEGATIVE
URINE GLUCOSE: NEGATIVE
Urobilinogen, UA: 0.2 (ref 0.0–1.0)
pH: 6 (ref 5.0–8.0)

## 2017-08-02 LAB — COMPREHENSIVE METABOLIC PANEL
ALBUMIN: 4 g/dL (ref 3.5–5.2)
ALK PHOS: 43 U/L (ref 39–117)
ALT: 16 U/L (ref 0–35)
AST: 24 U/L (ref 0–37)
BILIRUBIN TOTAL: 0.6 mg/dL (ref 0.2–1.2)
BUN: 23 mg/dL (ref 6–23)
CALCIUM: 9.2 mg/dL (ref 8.4–10.5)
CO2: 27 mEq/L (ref 19–32)
CREATININE: 1.15 mg/dL (ref 0.40–1.20)
Chloride: 102 mEq/L (ref 96–112)
GFR: 48.8 mL/min — AB (ref >60.00)
Glucose, Bld: 123 mg/dL — ABNORMAL HIGH (ref 70–99)
Potassium: 4.3 mEq/L (ref 3.5–5.1)
Sodium: 137 mEq/L (ref 135–145)
TOTAL PROTEIN: 6.8 g/dL (ref 6.0–8.3)

## 2017-08-02 LAB — VITAMIN D 25 HYDROXY (VIT D DEFICIENCY, FRACTURES): VITD: 51.42 ng/mL (ref 30.00–100.00)

## 2017-08-02 LAB — SEDIMENTATION RATE: SED RATE: 40 mm/h — AB (ref 0–30)

## 2017-08-02 MED ORDER — HYDROCORTISONE 2.5 % RE CREA: 1.0000 "application " | TOPICAL_CREAM | Freq: Two times a day (BID) | RECTAL | 0 refills | Status: DC

## 2017-08-02 NOTE — Telephone Encounter (Signed)
Patient states she is having RUQ pain mostly after she eats, sometimes not related to eating. She saw her PCP today, patient states that they want her to be seen soon. I have offered patient an appointment for tomorrow with APP, patient declined to see APP as she states she had a bad experience with one and prefers to see the physician. Patient has appointment on 3/29 unfortunately there is nothing sooner.

## 2017-08-02 NOTE — Patient Instructions (Addendum)
Important to get protein, can do hi protein supplements like Boost or Ensure Another option Carnation Instant breakfast or smoothies made with yogurt and/tofu.  Consider Yellow split Powder and/or Whey powder   Cleanse with Witch Hazel Astringent and then apply Desitin with zinc Abdominal Pain, Adult Many things can cause belly (abdominal) pain. Most times, belly pain is not dangerous. Many cases of belly pain can be watched and treated at home. Sometimes belly pain is serious, though. Your doctor will try to find the cause of your belly pain. Follow these instructions at home:  Take over-the-counter and prescription medicines only as told by your doctor. Do not take medicines that help you poop (laxatives) unless told to by your doctor.  Drink enough fluid to keep your pee (urine) clear or pale yellow.  Watch your belly pain for any changes.  Keep all follow-up visits as told by your doctor. This is important. Contact a doctor if:  Your belly pain changes or gets worse.  You are not hungry, or you lose weight without trying.  You are having trouble pooping (constipated) or have watery poop (diarrhea) for more than 2-3 days.  You have pain when you pee or poop.  Your belly pain wakes you up at night.  Your pain gets worse with meals, after eating, or with certain foods.  You are throwing up and cannot keep anything down.  You have a fever. Get help right away if:  Your pain does not go away as soon as your doctor says it should.  You cannot stop throwing up.  Your pain is only in areas of your belly, such as the right side or the left lower part of the belly.  You have bloody or black poop, or poop that looks like tar.  You have very bad pain, cramping, or bloating in your belly.  You have signs of not having enough fluid or water in your body (dehydration), such as: ? Dark pee, very little pee, or no pee. ? Cracked lips. ? Dry mouth. ? Sunken  eyes. ? Sleepiness. ? Weakness. This information is not intended to replace advice given to you by your health care provider. Make sure you discuss any questions you have with your health care provider. Document Released: 10/27/2007 Document Revised: 11/28/2015 Document Reviewed: 10/22/2015 Elsevier Interactive Patient Education  2018 [employer].   Can clean bum with Allen then apply Desitin with Zinc to coat area Kidney Stones Kidney stones (urolithiasis) are rock-like masses that form inside of the kidneys. Kidneys are organs that make pee (urine). A kidney stone can cause very bad pain and can block the flow of pee. The stone usually leaves your body (passes) through your pee. You may need to have a doctor take out the stone. Follow these instructions at home: Eating and drinking  Drink enough fluid to keep your pee clear or pale yellow. This will help you pass the stone.  If told by your doctor, change the foods you eat (your diet). This may include: ? Limiting how much salt (sodium) you eat. ? Eating more fruits and vegetables. ? Limiting how much meat, poultry, fish, and eggs you eat.  Follow instructions from your doctor about eating or drinking restrictions. General instructions  Collect pee samples as told by your doctor. You may need to collect a pee sample: ? 24 hours after a stone comes out. ? 8-12 weeks after a stone comes out, and every 6-12 months after that.  Strain  your pee every time you pee (urinate), for as long as told. Use the strainer that your doctor recommends.  Do not throw out the stone. Keep it so that it can be tested by your doctor.  Take over-the-counter and prescription medicines only as told by your doctor.  Keep all follow-up visits as told by your doctor. This is important. You may need follow-up tests. Preventing kidney stones To prevent another kidney stone:  Drink enough fluid to keep your pee clear or pale yellow. This  is the best way to prevent kidney stones.  Eat healthy foods.  Avoid certain foods as told by your doctor. You may be told to eat less protein.  Stay at a healthy weight.  Contact a doctor if:  You have pain that gets worse or does not get better with medicine. Get help right away if:  You have a fever or chills.  You get very bad pain.  You get new pain in your belly (abdomen).  You pass out (faint).  You cannot pee. This information is not intended to replace advice given to you by your health care provider. Make sure you discuss any questions you have with your health care provider. Document Released: 10/27/2007 Document Revised: 01/27/2016 Document Reviewed: 01/27/2016 Elsevier Interactive Patient Education  2017 [employer].

## 2017-08-02 NOTE — Telephone Encounter (Signed)
I reviewed her recent CT scan and labs which are normal. I think trying her on omeprazole 20mg  BID would be a good thing to start (I think she is only on zantac), she can get this OTC or we can prescribe it. Unfortunately I will be out of town next week and my availability is limited due to this issue. Please let her know the APPs will discuss her case with me if she sees them, and she can still keep her appointment with me on 3/29, if she wishes to be seen sooner please book with APP. In the interim will see how she does with omeprazole.

## 2017-08-02 NOTE — Progress Notes (Signed)
Subjective:  I acted as a Education administrator for Dr. Charlett Blake. [name], [place]  Patient ID: Julie Wilkerson, female    DOB: [birth year], 77 y.o.   MRN: [id number]  No chief complaint on file.   HPI  Patient is in today for an acute visit for stomach issues.  She continues to struggle with abdominal distention and abdominal pain most notably in the epigastrium.  She has been only eating food because anything else causes vomiting.  Just eating fruit she struggles with nausea but no vomiting.  No fevers or chills.  Her bowels continue to move every day to 2.  Anorexia is persistent.  No fevers or chills.  She tested negative for H. pylori last month but has had H. pylori in the past.  Has undergone numerous abdominal surgeries including appendectomy, cholecystectomy, hysterectomy. No polydipsia or polyuria. Denies CP/palp/SOB/HA/congestion/fevers/GI c/o. Taking meds as prescribed  Patient Care Team: [name], MD as PCP - General (Family Medicine) Lake Bells., MD as Consulting Physician (Gastroenterology) Belva Bertin, Armida Sans as Physician Assistant (Family Medicine)   Past Medical History:  Diagnosis Date  . Asthma   . Breast cancer ([place])    2002, left, encapsulated, microcalcifications. lumpectomy, radtiation x 30  . Breast cancer in female [facility])   . Change in mole 06/08/2016  . Colitis   . Decreased visual acuity 01/16/2017  . Dyspnea 11/04/2016  . Dysuria 11/04/2016  . Gluten intolerance   . History of chicken pox   . History of Helicobacter pylori infection 08/01/2015  . Hyperlipidemia   . Hypertension   . Hypothyroid   . IBS (irritable bowel syndrome)   . Left leg pain 03/01/2017  . Low back pain 08/10/2015  . Neck pain 03/01/2017  . Osteoporosis   . RLQ discomfort 03/01/2017  . Stomach cramps   . Urinary tract infection 11/04/2016    Past Surgical History:  Procedure Laterality Date  . ABDOMINAL HYSTERECTOMY     partial  . APPENDECTOMY    . BREAST LUMPECTOMY Left 2002  .  CHOLECYSTECTOMY  Age 8 or 55  . COLONOSCOPY  2017  . FRACTURE SURGERY    . TONSILLECTOMY      Family History  Problem Relation Age of Onset  . Colitis Mother   . Irritable bowel syndrome Mother   . Hypertension Father   . Hyperlipidemia Brother   . Heart attack Brother   . Stomach cancer Paternal Grandmother 17  . Hyperlipidemia Brother   . Heart attack Brother   . Hyperlipidemia Brother   . Heart attack Brother   . Hyperlipidemia Sister   . Leukemia Daughter   . Arthritis Daughter   . Heart disease Daughter        ASD vs VSD    Social History   Socioeconomic History  . Marital status: Single    Spouse name: Not on file  . Number of children: Not on file  . Years of education: Not on file  . Highest education level: Not on file  Social Needs  . Financial resource strain: Not on file  . Food insecurity - worry: Not on file  . Food insecurity - inability: Not on file  . Transportation needs - medical: Not on file  . Transportation needs - non-medical: Not on file  Occupational History  . Occupation: retired    Comment: Pharmacist, hospital, kindergarten  Tobacco Use  . Smoking status: Never Smoker  . Smokeless tobacco: Never Used  Substance and Sexual Activity  .  Alcohol use: No    Alcohol/week: 0.0 oz  . Drug use: No  . Sexual activity: No    Comment: lives alone, avoids daiiry and gluten. 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 Not on file  Social History Narrative  . Not on file    Outpatient Medications Prior to Visit  Medication Sig Dispense Refill  . albuterol (PROVENTIL HFA;VENTOLIN HFA) 108 (90 Base) MCG/ACT inhaler Inhale 2 puffs into the lungs every 4 (four) hours as needed for wheezing or shortness of breath.    Marland Kitchen albuterol (PROVENTIL) (2.5 MG/3ML) 0.083% nebulizer solution Take 3 mLs (2.5 mg total) by nebulization every 6 (six) hours as needed for wheezing or shortness of breath. 150 mL 1  . aspirin 81 MG tablet Take 81 mg by mouth daily.    .  beclomethasone (QVAR REDIHALER) 80 MCG/ACT inhaler Inhale 2 puffs into the lungs 2 (two) times daily. 1 Inhaler 0  . citalopram (CELEXA) 20 MG tablet Take 1 tablet (20 mg total) by mouth daily. 90 tablet 1  . Collagen-Vitamin C (COLLAGEN PLUS VITAMIN C PO) Take 1 tablet by mouth daily.    . hyoscyamine (LEVSIN SL) 0.125 MG SL tablet Place 0.125 mg under the tongue every 4 (four) hours as needed for cramping.    Marland Kitchen levothyroxine (SYNTHROID, LEVOTHROID) 50 MCG tablet TAKE 1 TABLET BY MOUTH ONCE DAILY 90 tablet 1  . lisinopril (PRINIVIL,ZESTRIL) 20 MG tablet Take 1 tablet (20 mg total) by mouth daily. 30 tablet 3  . methylPREDNISolone (MEDROL) 4 MG tablet 5 tab po qd X 1d then 4 tab po qd X 1d then 3 tab po qd X 1d then 2 tab po qd then 1 tab po qd 15 tablet 0  . metoprolol succinate (TOPROL XL) 25 MG 24 hr tablet Take 0.5 tablets (12.5 mg total) by mouth daily. 45 tablet 3  . Multiple Vitamins-Minerals (MULTIVITAMIN ADULT PO) Take 1 tablet by mouth daily.    . ondansetron (ZOFRAN ODT) 8 MG disintegrating tablet Take 1 tablet (8 mg total) by mouth every 8 (eight) hours as needed for nausea or vomiting. 20 tablet 0  . promethazine-dextromethorphan (PROMETHAZINE-DM) 6.25-15 MG/5ML syrup Take 5 mLs by mouth 4 (four) times daily as needed. 118 mL 0  . ranitidine (ZANTAC) 150 MG capsule Take 1 capsule (150 mg total) by mouth 2 (two) times daily. 60 capsule 0  . Vitamin D, Ergocalciferol, (DRISDOL) 50000 units CAPS capsule Take 1 capsule (50,000 Units total) by mouth every 7 (seven) days. 12 capsule 1   No facility-administered medications prior to visit.     No Known Allergies  Review of Systems  Constitutional: Negative for fever and malaise/fatigue.  HENT: Negative for congestion.   Eyes: Negative for blurred vision.  Respiratory: Negative for shortness of breath.   Cardiovascular: Negative for chest pain, palpitations and leg swelling.  Gastrointestinal: Positive for abdominal pain and nausea.  Negative for blood in stool, constipation, diarrhea, melena and vomiting.  Genitourinary: Negative for dysuria and frequency.  Musculoskeletal: Negative for falls.  Skin: Negative for rash.  Neurological: Negative for dizziness, loss of consciousness and headaches.  Endo/Heme/Allergies: Negative for environmental allergies.  Psychiatric/Behavioral: Negative for depression. The patient is not nervous/anxious.        Objective:    Physical Exam  Constitutional: She is oriented to person, place, and time. She appears well-developed and well-nourished. No distress.  HENT:  Head: Normocephalic and atraumatic.  Nose: Nose normal.  Eyes: Right eye exhibits  no discharge. Left eye exhibits no discharge.  Neck: Normal range of motion. Neck supple.  Cardiovascular: Normal rate and regular rhythm.  No murmur heard. Pulmonary/Chest: Effort normal and breath sounds normal.  Abdominal: Soft. Bowel sounds are normal. She exhibits distension. She exhibits no mass. There is tenderness. There is no rebound and no guarding.  Musculoskeletal: She exhibits no edema.  Neurological: She is alert and oriented to person, place, and time.  Skin: Skin is warm and dry.  Psychiatric: She has a normal mood and affect.  Nursing note and vitals reviewed.   BP (!) 120/58 (BP Location: Left Arm, Patient Position: Sitting, Cuff Size: Normal)   Pulse (!) 55   Temp 97.7 F (36.5 C) (Oral)   Resp 18   Wt 125 lb 9.6 oz (57 kg)   SpO2 97%   BMI 25.37 kg/m  Wt Readings from Last 3 Encounters:  08/02/17 125 lb 9.6 oz (57 kg)  07/19/17 125 lb (56.7 kg)  07/19/17 125 lb 9.6 oz (57 kg)   BP Readings from Last 3 Encounters:  08/02/17 (!) 120/58  07/19/17 (!) 107/59  07/19/17 (!) 118/59     Immunization History  Administered Date(s) Administered  . Influenza, High Dose Seasonal PF 03/11/2015, 02/05/2016, 02/05/2017  . Influenza,inj,quad, With Preservative 05/23/2014  . Pneumococcal Polysaccharide-23  05/23/2014  . Td 02/05/2016  . Zoster 05/24/2009    Health Maintenance  Topic Date Due  . PNA vac Low Risk Adult (2 of 2 - PCV13) 06/27/2018 (Originally 05/24/2015)  . COLONOSCOPY  08/04/2025  . TETANUS/TDAP  02/04/2026  . INFLUENZA VACCINE  Completed  . DEXA SCAN  Completed    Lab Results  Component Value Date   WBC 4.6 08/02/2017   HGB 11.4 (L) 08/02/2017   HCT 33.5 (L) 08/02/2017   PLT 221.0 08/02/2017   GLUCOSE 123 (H) 08/02/2017   CHOL 236 (H) 03/01/2017   TRIG 116.0 03/01/2017   HDL 72.70 03/01/2017   LDLCALC 140 (H) 03/01/2017   ALT 16 08/02/2017   AST 24 08/02/2017   NA 137 08/02/2017   K 4.3 08/02/2017   CL 102 08/02/2017   CREATININE 1.15 08/02/2017   BUN 23 08/02/2017   CO2 27 08/02/2017   TSH 2.42 03/01/2017   HGBA1C 6.0 03/01/2017    Lab Results  Component Value Date   TSH 2.42 03/01/2017   Lab Results  Component Value Date   WBC 4.6 08/02/2017   HGB 11.4 (L) 08/02/2017   HCT 33.5 (L) 08/02/2017   MCV 90.5 08/02/2017   PLT 221.0 08/02/2017   Lab Results  Component Value Date   NA 137 08/02/2017   K 4.3 08/02/2017   CO2 27 08/02/2017   GLUCOSE 123 (H) 08/02/2017   BUN 23 08/02/2017   CREATININE 1.15 08/02/2017   BILITOT 0.6 08/02/2017   ALKPHOS 43 08/02/2017   AST 24 08/02/2017   ALT 16 08/02/2017   PROT 6.8 08/02/2017   ALBUMIN 4.0 08/02/2017   CALCIUM 9.2 08/02/2017   ANIONGAP 7 07/19/2017   GFR 48.80 (L) 08/02/2017   Lab Results  Component Value Date   CHOL 236 (H) 03/01/2017   Lab Results  Component Value Date   HDL 72.70 03/01/2017   Lab Results  Component Value Date   LDLCALC 140 (H) 03/01/2017   Lab Results  Component Value Date   TRIG 116.0 03/01/2017   Lab Results  Component Value Date   CHOLHDL 3 03/01/2017   Lab Results  Component Value Date  HGBA1C 6.0 03/01/2017         Assessment & Plan:   Problem List Items Addressed This Visit    Essential hypertension    Well controlled, no changes to meds.  Encouraged heart healthy diet such as the DASH diet and exercise as tolerated.       Hypothyroid    On Levothyroxine, continue to monitor      Vitamin D deficiency    Continue supplementation      Relevant Orders   VITAMIN D 25 Hydroxy (Vit-D Deficiency, Fractures) (Completed)   Urinary tract infection    Urinalysis looks clear.       Hyperglycemia    hgba1c acceptable, minimize simple carbs. Increase exercise as tolerated.       Abdominal pain - Primary    Has a history of Hpylori although she tested negative in February. She is struggling with nausea and anorexiaand only eating fruit to keep the nausea at [place]. As a result she has lost a good deal of weight. She continues to have epigastric pain and generalized abdominal bloating. She has had numerous abdominal surgeries including cholecystectomy, appendectomy, and hysterectomy. She does get some relief from her Hyoscyamine prn      Relevant Medications   hydrocortisone (ANUSOL-HC) 2.5 % rectal cream   Other Relevant Orders   CBC w/Diff (Completed)   Sedimentation rate (Completed)   Urinalysis (Completed)   Urine Culture   Ambulatory referral to Gastroenterology    Other Visit Diagnoses    Change in bowel habits       Relevant Medications   hydrocortisone (ANUSOL-HC) 2.5 % rectal cream   Other Relevant Orders   CBC w/Diff (Completed)   Sedimentation rate (Completed)   Ambulatory referral to Gastroenterology   Hypocalcemia       Relevant Orders   Comprehensive metabolic panel (Completed)      I am having Jamison Neighbor start on hydrocortisone. I am also having her maintain her Multiple Vitamins-Minerals (MULTIVITAMIN ADULT PO), Collagen-Vitamin C (COLLAGEN PLUS VITAMIN C PO), aspirin, Vitamin D (Ergocalciferol), beclomethasone, citalopram, albuterol, hyoscyamine, metoprolol succinate, lisinopril, promethazine-dextromethorphan, albuterol, levothyroxine, methylPREDNISolone, ondansetron, and ranitidine.  Meds ordered this  encounter  Medications  . hydrocortisone (ANUSOL-HC) 2.5 % rectal cream    Sig: Place 1 application rectally 2 (two) times daily.    Dispense:  30 g    Refill:  0    CMA served as scribe during this visit. History, Physical and Plan performed by medical provider. Documentation and orders reviewed and attested to.  Penni Homans, MD

## 2017-08-03 NOTE — Telephone Encounter (Signed)
Patient started the omeprazole last night, is feeling some better. She will continue to take the medication and plans on keeping her appointment on 3/29. She said that if she starts feeling worse will call back and see about getting in to see APP prior to her appointment with Dr. Havery Moros.

## 2017-08-03 NOTE — Assessment & Plan Note (Signed)
hgba1c acceptable, minimize simple carbs. Increase exercise as tolerated.  

## 2017-08-03 NOTE — Assessment & Plan Note (Signed)
On Levothyroxine, continue to monitor 

## 2017-08-03 NOTE — Assessment & Plan Note (Signed)
Continue supplementation  ?

## 2017-08-03 NOTE — Assessment & Plan Note (Signed)
Urinalysis looks clear.

## 2017-08-03 NOTE — Assessment & Plan Note (Signed)
Well controlled, no changes to meds. Encouraged heart healthy diet such as the DASH diet and exercise as tolerated.  °

## 2017-08-03 NOTE — Assessment & Plan Note (Signed)
Has a history of Hpylori although she tested negative in February. She is struggling with nausea and anorexiaand only eating fruit to keep the nausea at [place]. As a result she has lost a good deal of weight. She continues to have epigastric pain and generalized abdominal bloating. She has had numerous abdominal surgeries including cholecystectomy, appendectomy, and hysterectomy. She does get some relief from her Hyoscyamine prn

## 2017-08-05 ENCOUNTER — Encounter: Payer: Self-pay | Admitting: Physician Assistant

## 2017-08-05 ENCOUNTER — Other Ambulatory Visit: Payer: Medicare HMO

## 2017-08-05 ENCOUNTER — Ambulatory Visit: Payer: Medicare HMO | Admitting: Physician Assistant

## 2017-08-05 VITALS — BP 112/58 | HR 53 | Ht 59.0 in | Wt 127.0 lb

## 2017-08-05 DIAGNOSIS — R195 Other fecal abnormalities: Secondary | ICD-10-CM | POA: Diagnosis not present

## 2017-08-05 DIAGNOSIS — R140 Abdominal distension (gaseous): Secondary | ICD-10-CM | POA: Diagnosis not present

## 2017-08-05 DIAGNOSIS — R109 Unspecified abdominal pain: Secondary | ICD-10-CM

## 2017-08-05 LAB — URINE CULTURE
MICRO NUMBER:: 90311732
SPECIMEN QUALITY: ADEQUATE

## 2017-08-05 MED ORDER — COLESTIPOL HCL 1 G PO TABS: 1.0000 g | ORAL_TABLET | Freq: Two times a day (BID) | ORAL | 1 refills | Status: DC

## 2017-08-05 MED ORDER — OMEPRAZOLE 20 MG PO CPDR: DELAYED_RELEASE_CAPSULE | ORAL | 3 refills | Status: DC

## 2017-08-05 NOTE — Progress Notes (Signed)
Chief Complaint: Abdominal pain, nausea, change in bowel habits  HPI:    Julie Wilkerson is a 77 year old female, known to Dr. Havery Moros, with a past medical history of breast cancer, GERD, chronic abdominal pain and anemia, who was referred to me by [name], MD for a complaint of abdominal pain, nausea and change in bowel habits.      Last OV 03/30/16 for discussion of mid abdomen/epigastric pain and "swelling" after she ate.  This has been going on for a long time.  Also remote history of celiac disease, though gluten intolerance was suspected.  Mild anemia discussed and chronic for the patient.  TTG serology checked and negative/normal.  Patient had reflux symptoms and was started on Zantac 150 twice daily as well as Colestid 1 g twice daily and told to continue hyoscyamine as needed.    Today, expresses that 4 weeks ago after eating fried fish out at a restaurant she started with worsening abdominal pain which she describes as a "poking" 9-10/10 in her right upper quadrant and epigastrium which starts 5 minutes after eating anything at this point.  Tends to radiate down into the sides of her belly.  Can last all day long if it starts.  Hyoscyamine does help when she takes this medication which has been 1-3 times a day over the past few weeks.     Patient also reports feeling "full up" and describes leg and back ache.  Patient's daughter is a PA and apparently started her on Omeprazole 20 mg at night 2 days ago, she has noticed no change after taking this medicine for 2 days.    Patient describes a change in bowel habits to loose stools over the past 4 weeks.  She has at least 6 a day which are brought on by eating.  Denies knowledge of being on Colestid in the past.    Denies fever, chills, weight loss, vomiting or symptoms that awaken her at night.  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 Ileum normal. No polyps or evidence of colitis. Biopsies not taken.  EGD 08/05/2015 - mild gastritis on path, mild esophagitis with esophagus with normal path, normal duodenum on path    Past Medical History:  Diagnosis Date  . Asthma   . Breast cancer ([place])    2002, left, encapsulated, microcalcifications. lumpectomy, radtiation x 30  . Breast cancer in female [facility])   . Change in mole 06/08/2016  . Colitis   . Decreased visual acuity 01/16/2017  . Dyspnea 11/04/2016  . Dysuria 11/04/2016  . Gluten intolerance   . History of chicken pox   . History of Helicobacter pylori infection 08/01/2015  . Hyperlipidemia   . Hypertension   . Hypothyroid   . IBS (irritable bowel syndrome)   . Left leg pain 03/01/2017  . Low back pain 08/10/2015  . Neck pain 03/01/2017  . Osteoporosis   . RLQ discomfort 03/01/2017  . Stomach cramps   . Urinary tract infection 11/04/2016    Past Surgical History:  Procedure Laterality Date  . ABDOMINAL HYSTERECTOMY     partial  . APPENDECTOMY    . BREAST LUMPECTOMY Left 2002  . CHOLECYSTECTOMY  Age 26 or 89  . COLONOSCOPY  2017  . FRACTURE SURGERY    . TONSILLECTOMY      Current Outpatient Medications  Medication Sig Dispense Refill  . albuterol (  PROVENTIL HFA;VENTOLIN HFA) 108 (90 Base) MCG/ACT inhaler Inhale 2 puffs into the lungs every 4 (four) hours as needed for wheezing or shortness of breath.    Marland Kitchen albuterol (PROVENTIL) (2.5 MG/3ML) 0.083% nebulizer solution Take 3 mLs (2.5 mg total) by nebulization every 6 (six) hours as needed for wheezing or shortness of breath. 150 mL 1  . aspirin 81 MG tablet Take 81 mg by mouth daily.    . beclomethasone (QVAR REDIHALER) 80 MCG/ACT inhaler Inhale 2 puffs into the lungs 2 (two) times daily. 1 Inhaler 0  . citalopram (CELEXA) 20 MG tablet Take 1 tablet (20 mg total) by mouth daily. 90 tablet 1  . Collagen-Vitamin C (COLLAGEN PLUS VITAMIN C PO) Take 1 tablet by mouth daily.    . hydrocortisone  (ANUSOL-HC) 2.5 % rectal cream Place 1 application rectally 2 (two) times daily. 30 g 0  . hyoscyamine (LEVSIN SL) 0.125 MG SL tablet Place 0.125 mg under the tongue every 4 (four) hours as needed for cramping.    Marland Kitchen levothyroxine (SYNTHROID, LEVOTHROID) 50 MCG tablet TAKE 1 TABLET BY MOUTH ONCE DAILY 90 tablet 1  . lisinopril (PRINIVIL,ZESTRIL) 20 MG tablet Take 1 tablet (20 mg total) by mouth daily. 30 tablet 3  . methylPREDNISolone (MEDROL) 4 MG tablet 5 tab po qd X 1d then 4 tab po qd X 1d then 3 tab po qd X 1d then 2 tab po qd then 1 tab po qd 15 tablet 0  . metoprolol succinate (TOPROL XL) 25 MG 24 hr tablet Take 0.5 tablets (12.5 mg total) by mouth daily. 45 tablet 3  . Multiple Vitamins-Minerals (MULTIVITAMIN ADULT PO) Take 1 tablet by mouth daily.    . ondansetron (ZOFRAN ODT) 8 MG disintegrating tablet Take 1 tablet (8 mg total) by mouth every 8 (eight) hours as needed for nausea or vomiting. 20 tablet 0  . promethazine-dextromethorphan (PROMETHAZINE-DM) 6.25-15 MG/5ML syrup Take 5 mLs by mouth 4 (four) times daily as needed. 118 mL 0  . ranitidine (ZANTAC) 150 MG capsule Take 1 capsule (150 mg total) by mouth 2 (two) times daily. 60 capsule 0  . Vitamin D, Ergocalciferol, (DRISDOL) 50000 units CAPS capsule Take 1 capsule (50,000 Units total) by mouth every 7 (seven) days. 12 capsule 1  . colestipol (COLESTID) 1 g tablet Take 1 tablet (1 g total) by mouth 2 (two) times daily. 60 tablet 1  . omeprazole (PRILOSEC) 20 MG capsule Take 1 tab by mouth before breakfast and dinner. 60 capsule 3   No current facility-administered medications for this visit.     Allergies as of 08/05/2017  . (No Known Allergies)    Family History  Problem Relation Age of Onset  . Colitis Mother   . Irritable bowel syndrome Mother   . Hypertension Father   . Hyperlipidemia Brother   . Heart attack Brother   . Stomach cancer Paternal Grandmother 49  . Hyperlipidemia Brother   . Heart attack Brother   .  Hyperlipidemia Brother   . Heart attack Brother   . Hyperlipidemia Sister   . Leukemia Daughter   . Arthritis Daughter   . Heart disease Daughter        ASD vs VSD  . Colon cancer Neg Hx     Social History   Socioeconomic History  . Marital status: Single    Spouse name: Not on file  . Number of children: Not on file  . Years of education: Not on file  . Highest education  level: Not on file  Social Needs  . Financial resource strain: Not on file  . Food insecurity - worry: Not on file  . Food insecurity - inability: Not on file  . Transportation needs - medical: Not on file  . Transportation needs - non-medical: Not on file  Occupational History  . Occupation: retired    Comment: Pharmacist, hospital, kindergarten  Tobacco Use  . Smoking status: Never Smoker  . Smokeless tobacco: Never Used  Substance and Sexual Activity  . Alcohol use: No    Alcohol/week: 0.0 oz  . Drug use: No  . Sexual activity: No    Comment: lives alone, avoids daiiry and gluten. 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 Not on file  Social History Narrative  . Not on file    Review of Systems:    Constitutional: No weight loss, fever or chills Cardiovascular: No chest pain Respiratory: +SOB Gastrointestinal: See HPI and otherwise negative   Physical Exam:  Vital signs: BP (!) 112/58   Pulse (!) 53   Ht 4\' 11"  (1.499 m)   Wt 127 lb (57.6 kg)   BMI 25.65 kg/m   Constitutional:   Pleasant female appears to be in NAD, Well developed, Well nourished, alert and cooperative Head:  Normocephalic and atraumatic. Eyes:   PEERL, EOMI. No icterus. Conjunctiva pink. Ears:  Normal auditory acuity. Neck:  Supple Throat: Oral cavity and pharynx without inflammation, swelling or lesion.  Respiratory: Respirations even and unlabored. Lungs clear to auscultation bilaterally.   No wheezes, crackles, or rhonchi.  Cardiovascular: Normal S1, S2. No MRG. Regular rate and rhythm. No peripheral edema, cyanosis  or pallor.  Gastrointestinal:  Soft, nondistended, Moderate generalized ttp, some worse in RUQ and Epigastrum, No rebound or guarding. Increased BS all four quads, No appreciable masses or hepatomegaly. Rectal:  Not performed.  Msk:  Symmetrical without gross deformities. Without edema, no deformity or joint abnormality.  Neurologic:  Alert and  oriented x4;  grossly normal neurologically.  Skin:   Dry and intact without significant lesions or rashes. Psychiatric:  Demonstrates good judgement and reason without abnormal affect or behaviors.  RELEVANT LABS AND IMAGING: CBC    Component Value Date/Time   WBC 4.6 08/02/2017 0955   RBC 3.70 (L) 08/02/2017 0955   HGB 11.4 (L) 08/02/2017 0955   HCT 33.5 (L) 08/02/2017 0955   PLT 221.0 08/02/2017 0955   MCV 90.5 08/02/2017 0955   MCH 30.3 07/19/2017 1402   MCHC 34.1 08/02/2017 0955   RDW 15.0 08/02/2017 0955   LYMPHSABS 1.5 08/02/2017 0955   MONOABS 0.6 08/02/2017 0955   EOSABS 0.0 08/02/2017 0955   BASOSABS 0.0 08/02/2017 0955    CMP     Component Value Date/Time   NA 137 08/02/2017 0955   K 4.3 08/02/2017 0955   CL 102 08/02/2017 0955   CO2 27 08/02/2017 0955   GLUCOSE 123 (H) 08/02/2017 0955   BUN 23 08/02/2017 0955   CREATININE 1.15 08/02/2017 0955   CREATININE 1.20 (H) 07/15/2017 1603   CALCIUM 9.2 08/02/2017 0955   PROT 6.8 08/02/2017 0955   ALBUMIN 4.0 08/02/2017 0955   AST 24 08/02/2017 0955   ALT 16 08/02/2017 0955   ALKPHOS 43 08/02/2017 0955   BILITOT 0.6 08/02/2017 0955   GFRNONAA 57 (L) 07/19/2017 1451   GFRAA >60 07/19/2017 1451   EXAM: CT ABDOMEN AND PELVIS WITH CONTRAST 07/19/17  TECHNIQUE: Multidetector CT imaging of the abdomen and pelvis was performed using the  standard protocol following bolus administration of intravenous contrast.  CONTRAST:  179mL ISOVUE-300 IOPAMIDOL (ISOVUE-300) INJECTION 61%  COMPARISON:  CT abdomen pelvis of 05/28/2014  FINDINGS: Lower chest: Linear scarring remains  at both lung bases. No definite lung nodule is seen on the images obtained. There is a small to moderate size hiatal hernia present. Cardiomegaly is stable.  Hepatobiliary: The liver enhances with no focal abnormality and no ductal dilatation is seen. The gallbladder appears to have been resected.  Pancreas: The pancreas is normal in size and the pancreatic duct is not dilated.  Spleen: The spleen is unremarkable.  Adrenals/Urinary Tract: The adrenal glands are within normal limits. The kidneys enhance with no calculus or mass and on delayed images, the pelvocaliceal systems are unremarkable. The right ureter is normal in caliber. The left ureter is slightly prominent to the bladder but no reason for partial obstruction is seen. This could be due to edema from recent passage of a calculus. However no calculus is seen within the urinary bladder. No urinary bladder abnormality is noted.  Stomach/Bowel: The stomach is slightly distended with oral contrast with no abnormality noted. No small bowel distention is seen. There are only a few scattered rectosigmoid colon diverticula present. There is a moderate amount of feces throughout the colon. The terminal ileum is unremarkable. By history the appendix has previously been resected.  Vascular/Lymphatic: The abdominal aorta is normal in caliber although somewhat ectatic with only mild abdominal aortic atherosclerosis present. No adenopathy is seen.  Reproductive: The uterus has previously been resected. No adnexal lesion is seen. No free fluid is noted within the pelvis.  Other: None.  Musculoskeletal: There is mild lumbar curvature convex left. Degenerative disc disease is noted at L5-S1 with degenerative change throughout the facet joints of the lower lumbar spine as well.  IMPRESSION: 1. No definite explanation for the patient's right sided abdominal pain is seen. No renal or ureteral calculi are noted. 2. Very  slight prominence of the left ureter to the bladder could indicate edema from recent passage of a calculus but no calculus is evident currently. 3. Small to moderate size hiatal hernia. 4. Degenerative disc disease at L5-S1.   Electronically Signed   By: Ivar Drape M.D.   On: 07/19/2017 16:22  Assessment: 1.  Loose stools: New over the past 4 weeks, 6 loose stools a day, started after eating out dinner one night, known gluten intolerance; consider infectious cause versus IBS versus other 2.  Generalized abdominal pain:, Some worse in epigastrium /right upper quadrant, question relation to gastritis as patient was not on her Zantac, does have history of chronic abdominal pain 3.  Bloating: After eating  Plan: 1.  Ordered stool studies including GI pathogen panel, C. difficile and O&P 2.  Recommend patient start Colestid 1 g twice daily 3.  Increased Omeprazole to 20 mg twice daily per recommendations from Dr. Havery Moros previously.  Prescribed #60 with 1 refill 4.  Would recommend patient take Hyoscyamine 20 minutes before eating 3 times a day instead of waiting for her symptoms to start.  Patient tells me she has enough of this medication at home. 5.  Patient was asking if she needed EGD/ colonoscopy today.  I believe we should start with measures above and she can discuss this with Dr. Havery Moros at her appointment at the end of March. 6.  Patient told to call our clinic if her symptoms worsen before then  Ellouise Newer, PA-C [facility] 08/05/2017, 3:58 PM  Cc:  [name], MD

## 2017-08-05 NOTE — Patient Instructions (Addendum)
Please go to the basement level to our lab for stool studies.  We have sent the following medications to your pharmacy for you to pick up at your convenience:[facility]  1. Omeprazole 20 mg 2. Colestid  Take the Hyoscyamine 20 -30 min before meals 3 times daily.  Follow up with Dr. Epes Cellar as needed.   If you are age 76 or older, your body mass index should be between 23-30. Your Body mass index is 25.65 kg/m. If this is out of the aforementioned range listed, please consider follow up with your Primary Care Provider.

## 2017-08-06 NOTE — Progress Notes (Signed)
Agree with assessment and plan as outlined.  

## 2017-08-08 ENCOUNTER — Telehealth: Payer: Self-pay | Admitting: Medical

## 2017-08-08 ENCOUNTER — Ambulatory Visit (INDEPENDENT_AMBULATORY_CARE_PROVIDER_SITE_OTHER): Payer: Medicare HMO | Admitting: Medical

## 2017-08-08 ENCOUNTER — Encounter: Payer: Self-pay | Admitting: Medical

## 2017-08-08 VITALS — BP 133/70 | HR 59 | Temp 98.6°F | Ht 59.0 in | Wt 126.4 lb

## 2017-08-08 DIAGNOSIS — J40 Bronchitis, not specified as acute or chronic: Secondary | ICD-10-CM

## 2017-08-08 DIAGNOSIS — M791 Myalgia, unspecified site: Secondary | ICD-10-CM

## 2017-08-08 DIAGNOSIS — J029 Acute pharyngitis, unspecified: Secondary | ICD-10-CM | POA: Diagnosis not present

## 2017-08-08 DIAGNOSIS — J111 Influenza due to unidentified influenza virus with other respiratory manifestations: Secondary | ICD-10-CM | POA: Diagnosis not present

## 2017-08-08 LAB — POC INFLUENZA A&B (BINAX/QUICKVUE)
INFLUENZA B, POC: NEGATIVE
Influenza A, POC: POSITIVE — AB

## 2017-08-08 LAB — POCT RAPID STREP A (OFFICE): RAPID STREP A SCREEN: POSITIVE — AB

## 2017-08-08 MED ORDER — OSELTAMIVIR PHOSPHATE 75 MG PO CAPS: ORAL_CAPSULE | ORAL | 0 refills | Status: DC

## 2017-08-08 MED ORDER — AMOXICILLIN 875 MG PO TABS: 875.0000 mg | ORAL_TABLET | Freq: Two times a day (BID) | ORAL | 0 refills | Status: DC

## 2017-08-08 MED ORDER — BENZONATATE 100 MG PO CAPS: 100.0000 mg | ORAL_CAPSULE | Freq: Three times a day (TID) | ORAL | 0 refills | Status: DC | PRN

## 2017-08-08 MED ORDER — FLUTICASONE PROPIONATE 50 MCG/ACT NA SUSP: 2.0000 | Freq: Every day | NASAL | 1 refills | Status: DC

## 2017-08-08 NOTE — Progress Notes (Signed)
Subjective:    Patient ID: Julie Wilkerson, female    DOB: 1940-12-23, 77 y.o.   MRN: [id number]  HPI Pt has cough,congestion, runny nose, chills, body aches and some st. Symptoms started day before yesterday. Acute onset symptoms on Saturday morning.  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  Pt had flu vaccine this year.   Review of Systems  Constitutional: Positive for chills, fatigue and fever.  HENT: Positive for congestion, sneezing and sore throat.   Respiratory: Positive for cough.   Cardiovascular: Negative for chest pain and palpitations.  Gastrointestinal: Negative for abdominal pain, constipation, diarrhea and vomiting.  Genitourinary: Negative for dysuria, flank pain, hematuria and vaginal pain.  Musculoskeletal: Positive for myalgias.  Neurological: Negative for dizziness, seizures, weakness and headaches.  Hematological: Negative for adenopathy. Does not bruise/bleed easily.  Psychiatric/Behavioral: Negative for behavioral problems, confusion and dysphoric mood. The patient is not nervous/anxious and is not hyperactive.     Past Medical History:  Diagnosis Date  . Asthma   . Breast cancer ([place])    2002, left, encapsulated, microcalcifications. lumpectomy, radtiation x 30  . Breast cancer in female [facility])   . Change in mole 06/08/2016  . Colitis   . Decreased visual acuity 01/16/2017  . Dyspnea 11/04/2016  . Dysuria 11/04/2016  . Gluten intolerance   . History of chicken pox   . History of Helicobacter pylori infection 08/01/2015  . Hyperlipidemia   . Hypertension   . Hypothyroid   . IBS (irritable bowel syndrome)   . Left leg pain 03/01/2017  . Low back pain 08/10/2015  . Neck pain 03/01/2017  . Osteoporosis   . RLQ discomfort 03/01/2017  . Stomach cramps   . Urinary tract infection 11/04/2016     Social History   Socioeconomic History  . Marital status: Single    Spouse name: Not on file  . Number of children: Not on file  . Years of education: Not on file  . Highest  education level: Not on file  Social Needs  . Financial resource strain: Not on file  . Food insecurity - worry: Not on file  . Food insecurity - inability: Not on file  . Transportation needs - medical: Not on file  . Transportation needs - non-medical: Not on file  Occupational History  . Occupation: retired    Comment: Pharmacist, hospital, kindergarten  Tobacco Use  . Smoking status: Never Smoker  . Smokeless tobacco: Never Used  Substance and Sexual Activity  . Alcohol use: No    Alcohol/week: 0.0 oz  . Drug use: No  . Sexual activity: No    Comment: lives alone, avoids daiiry and gluten. 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 Not on file  Social History Narrative  . Not on file    Past Surgical History:  Procedure Laterality Date  . ABDOMINAL HYSTERECTOMY     partial  . APPENDECTOMY    . BREAST LUMPECTOMY Left 2002  . CHOLECYSTECTOMY  Age 84 or 33  . COLONOSCOPY  2017  . FRACTURE SURGERY    . TONSILLECTOMY      Family History  Problem Relation Age of Onset  . Colitis Mother   . Irritable bowel syndrome Mother   . Hypertension Father   . Hyperlipidemia Brother   . Heart attack Brother   . Stomach cancer Paternal Grandmother 32  . Hyperlipidemia Brother   . Heart attack Brother   . Hyperlipidemia Brother   . Heart attack Brother   .  Hyperlipidemia Sister   . Leukemia Daughter   . Arthritis Daughter   . Heart disease Daughter        ASD vs VSD  . Colon cancer Neg Hx     No Known Allergies  Current Outpatient Medications on File Prior to Visit  Medication Sig Dispense Refill  . albuterol (PROVENTIL HFA;VENTOLIN HFA) 108 (90 Base) MCG/ACT inhaler Inhale 2 puffs into the lungs every 4 (four) hours as needed for wheezing or shortness of breath.    Marland Kitchen albuterol (PROVENTIL) (2.5 MG/3ML) 0.083% nebulizer solution Take 3 mLs (2.5 mg total) by nebulization every 6 (six) hours as needed for wheezing or shortness of breath. 150 mL 1  . aspirin 81 MG tablet Take 81  mg by mouth daily.    . beclomethasone (QVAR REDIHALER) 80 MCG/ACT inhaler Inhale 2 puffs into the lungs 2 (two) times daily. 1 Inhaler 0  . citalopram (CELEXA) 20 MG tablet Take 1 tablet (20 mg total) by mouth daily. 90 tablet 1  . colestipol (COLESTID) 1 g tablet Take 1 tablet (1 g total) by mouth 2 (two) times daily. 60 tablet 1  . Collagen-Vitamin C (COLLAGEN PLUS VITAMIN C PO) Take 1 tablet by mouth daily.    . hydrocortisone (ANUSOL-HC) 2.5 % rectal cream Place 1 application rectally 2 (two) times daily. 30 g 0  . hyoscyamine (LEVSIN SL) 0.125 MG SL tablet Place 0.125 mg under the tongue every 4 (four) hours as needed for cramping.    Marland Kitchen levothyroxine (SYNTHROID, LEVOTHROID) 50 MCG tablet TAKE 1 TABLET BY MOUTH ONCE DAILY 90 tablet 1  . lisinopril (PRINIVIL,ZESTRIL) 20 MG tablet Take 1 tablet (20 mg total) by mouth daily. 30 tablet 3  . methylPREDNISolone (MEDROL) 4 MG tablet 5 tab po qd X 1d then 4 tab po qd X 1d then 3 tab po qd X 1d then 2 tab po qd then 1 tab po qd 15 tablet 0  . metoprolol succinate (TOPROL XL) 25 MG 24 hr tablet Take 0.5 tablets (12.5 mg total) by mouth daily. 45 tablet 3  . Multiple Vitamins-Minerals (MULTIVITAMIN ADULT PO) Take 1 tablet by mouth daily.    Marland Kitchen omeprazole (PRILOSEC) 20 MG capsule Take 1 tab by mouth before breakfast and dinner. 60 capsule 3  . promethazine-dextromethorphan (PROMETHAZINE-DM) 6.25-15 MG/5ML syrup Take 5 mLs by mouth 4 (four) times daily as needed. 118 mL 0  . ranitidine (ZANTAC) 150 MG capsule Take 1 capsule (150 mg total) by mouth 2 (two) times daily. 60 capsule 0  . Vitamin D, Ergocalciferol, (DRISDOL) 50000 units CAPS capsule Take 1 capsule (50,000 Units total) by mouth every 7 (seven) days. 12 capsule 1   No current facility-administered medications on file prior to visit.     BP 133/70 (BP Location: Right Arm, Patient Position: Sitting, Cuff Size: Normal)   Pulse (!) 59   Temp 98.6 F (37 C) (Oral)   Ht 4\' 11"  (1.499 m)   Wt 126  lb 6.4 oz (57.3 kg)   SpO2 99%   BMI 25.53 kg/m       Objective:   Physical Exam  General  Mental Status - Alert. General Appearance - Well groomed. Not in acute distress.  Skin Rashes- No Rashes.  HEENT Head- Normal. Ear Auditory Canal - Left- Normal. Right - Normal.Tympanic Membrane- Left- Normal. Right- Normal. Eye Sclera/Conjunctiva- Left- Normal. Right- Normal. Nose & Sinuses Nasal Mucosa- Left-  Boggy and Congested. Right-  Boggy and  Congested.Bilateral no maxillary and  No frontal sinus pressure. Mouth & Throat Lips: Upper Lip- Normal: no dryness, cracking, pallor, cyanosis, or vesicular eruption. Lower Lip-Normal: no dryness, cracking, pallor, cyanosis or vesicular eruption. Buccal Mucosa- Bilateral- No Aphthous ulcers. Oropharynx- No Discharge or Erythema. Tonsils: Characteristics- Bilateral- mild  Erythema or Congestion. Size/Enlargement- Bilateral- No enlargement. Discharge- bilateral-None.  Neck Neck- Supple. No Masses.   Chest and Lung Exam Auscultation: Breath Sounds:-Clear even and unlabored.  Cardiovascular Auscultation:Rythm- Regular, rate and rhythm. Murmurs & Other Heart Sounds:Ausculatation of the heart reveal- No Murmurs.  Lymphatic Head & Neck General Head & Neck Lymphatics: Bilateral: Description- No Localized lymphadenopathy.         Assessment and Plan   Your rapid strep test was positive and your flu test was positive as well.  I am treating you with amoxicillin antibiotic and Tamiflu.  Explained start both today.  For body aches or fever can use Tylenol or ibuprofen.  For nasal congestion, I am prescribing Flonase.  For cough, I am prescribing benzonatate  Follow-up in 7 days or as needed.     Mackie Pai, PA-C

## 2017-08-08 NOTE — Patient Instructions (Addendum)
Your rapid strep test was positive and your flu test was positive as well.  I am treating you with amoxicillin antibiotic and Tamiflu.  Explained start both today.  For body aches or fever can use Tylenol or ibuprofen.  For nasal congestion, I am prescribing Flonase.  For cough, I am prescribing benzonatate  Follow-up in 7 days or as needed.    Note initially flu test came back with no control line.  So we re-swabbed her and let her go home.  After patient had left I was notified patient had positive flu test as well so I called her after hours and notify her that I would send tamiflu to her pharmacy and start medication tonight.  Patient expressed understanding.

## 2017-08-08 NOTE — Telephone Encounter (Signed)
I called patient and advised her that her flu test was positive.  We will send Tamiflu to patient's pharmacy.

## 2017-08-09 ENCOUNTER — Encounter: Payer: Self-pay | Admitting: Family Medicine

## 2017-08-09 ENCOUNTER — Other Ambulatory Visit: Payer: Medicare HMO

## 2017-08-09 DIAGNOSIS — R109 Unspecified abdominal pain: Secondary | ICD-10-CM | POA: Diagnosis not present

## 2017-08-09 DIAGNOSIS — R195 Other fecal abnormalities: Secondary | ICD-10-CM

## 2017-08-09 DIAGNOSIS — R140 Abdominal distension (gaseous): Secondary | ICD-10-CM

## 2017-08-09 MED ORDER — VANCOMYCIN HCL 125 MG PO CAPS: 125.0000 mg | ORAL_CAPSULE | Freq: Three times a day (TID) | ORAL | 0 refills | Status: DC

## 2017-08-09 NOTE — Addendum Note (Signed)
Addended by: [name] A on: 08/09/2017 11:26 AM   Modules accepted: Orders

## 2017-08-10 ENCOUNTER — Telehealth: Payer: Self-pay | Admitting: Family Medicine

## 2017-08-10 ENCOUNTER — Telehealth: Payer: Self-pay | Admitting: Medical

## 2017-08-10 NOTE — Telephone Encounter (Signed)
Open to review and send my chart message to patient.

## 2017-08-10 NOTE — Telephone Encounter (Signed)
Copied from [place] [id number]. Topic: General - Other >> Aug 10, 2017  1:04 PM Synthia Innocent wrote: Reason for CRM: Patient checking status of mychart message. Please advise  The medication recently prescribed, Vancomycin, costs $195. I did not pick up the medication because it is too expensive. Will the amoxicillin I am currently taking for strep throat take care of the UTI? If not, can you prescribe something else for my UTI that is less expensive?  Thank you,

## 2017-08-11 LAB — GASTROINTESTINAL PATHOGEN PANEL PCR
C. difficile Tox A/B, PCR: NOT DETECTED
CAMPYLOBACTER, PCR: NOT DETECTED
CRYPTOSPORIDIUM, PCR: NOT DETECTED
E COLI (STEC) STX1/STX2, PCR: NOT DETECTED
E coli (ETEC) LT/ST PCR: NOT DETECTED
E coli 0157, PCR: NOT DETECTED
GIARDIA LAMBLIA, PCR: NOT DETECTED
NOROVIRUS, PCR: NOT DETECTED
ROTAVIRUS, PCR: NOT DETECTED
Salmonella, PCR: NOT DETECTED
Shigella, PCR: NOT DETECTED

## 2017-08-11 LAB — OVA AND PARASITE EXAMINATION
CONCENTRATE RESULT:: NONE SEEN
MICRO NUMBER:: 90345037
SPECIMEN QUALITY: ADEQUATE
TRICHROME RESULT: NONE SEEN

## 2017-08-11 LAB — CLOSTRIDIUM DIFFICILE TOXIN B, QUALITATIVE, REAL-TIME PCR: CDIFFPCR: NOT DETECTED

## 2017-08-11 NOTE — Telephone Encounter (Signed)
Her infection is resistant to many things. Check with pharmacy to see if liquid Vancomycin would be cheaper

## 2017-08-11 NOTE — Telephone Encounter (Signed)
Please advise 

## 2017-08-16 NOTE — Telephone Encounter (Signed)
Called pharmacy was placed on hold for 15 minutes waiting on pharmacists

## 2017-08-17 DIAGNOSIS — M9902 Segmental and somatic dysfunction of thoracic region: Secondary | ICD-10-CM | POA: Diagnosis not present

## 2017-08-17 DIAGNOSIS — M47812 Spondylosis without myelopathy or radiculopathy, cervical region: Secondary | ICD-10-CM | POA: Diagnosis not present

## 2017-08-17 DIAGNOSIS — M542 Cervicalgia: Secondary | ICD-10-CM | POA: Diagnosis not present

## 2017-08-17 DIAGNOSIS — M9901 Segmental and somatic dysfunction of cervical region: Secondary | ICD-10-CM | POA: Diagnosis not present

## 2017-08-18 DIAGNOSIS — M542 Cervicalgia: Secondary | ICD-10-CM | POA: Diagnosis not present

## 2017-08-18 DIAGNOSIS — M9902 Segmental and somatic dysfunction of thoracic region: Secondary | ICD-10-CM | POA: Diagnosis not present

## 2017-08-18 DIAGNOSIS — M47812 Spondylosis without myelopathy or radiculopathy, cervical region: Secondary | ICD-10-CM | POA: Diagnosis not present

## 2017-08-18 DIAGNOSIS — M9901 Segmental and somatic dysfunction of cervical region: Secondary | ICD-10-CM | POA: Diagnosis not present

## 2017-08-19 ENCOUNTER — Other Ambulatory Visit: Payer: Self-pay

## 2017-08-19 ENCOUNTER — Ambulatory Visit: Payer: Medicare HMO | Admitting: Gastroenterology

## 2017-08-19 VITALS — BP 116/54 | HR 72 | Ht 59.0 in | Wt 127.0 lb

## 2017-08-19 DIAGNOSIS — R197 Diarrhea, unspecified: Secondary | ICD-10-CM | POA: Diagnosis not present

## 2017-08-19 DIAGNOSIS — R109 Unspecified abdominal pain: Secondary | ICD-10-CM | POA: Diagnosis not present

## 2017-08-19 DIAGNOSIS — R1013 Epigastric pain: Secondary | ICD-10-CM | POA: Diagnosis not present

## 2017-08-19 MED ORDER — ONDANSETRON 4 MG PO TBDP: 4.0000 mg | ORAL_TABLET | Freq: Four times a day (QID) | ORAL | 1 refills | Status: DC | PRN

## 2017-08-19 MED ORDER — AMBULATORY NON FORMULARY MEDICATION: 0 refills | Status: DC

## 2017-08-19 NOTE — Patient Instructions (Signed)
Normal BMI (Body Mass Index- based on height and weight) is between 23 and 30. Your BMI today is Body mass index is 25.65 kg/m. Marland Kitchen Please consider follow up  regarding your BMI with your Primary Care Provider.  We have sent  medications to your pharmacy for you to pick up at your convenience:  We have given you samples of the following medication to take:  FD gard,IBgard  Take as directed.  Continue Colested.  Thank you

## 2017-08-19 NOTE — Progress Notes (Signed)
HPI :  77 year old female here for follow-up visit.  She has been seen in the past for suspected irritable bowel syndrome.  She is had a negative workup for celiac disease including serologic testing and EGD.  She had negative stool testing in the past as well as colonoscopy in 2017 at [facility] (biopsies not taken).   She states she was actually doing pretty well since the last time I seen her until about a month ago states she ate a fried fish sandwich and developed extreme nausea, diarrhea, abdominal pains or day.  Her symptoms of diarrhea and abdominal cramping have persisted since that time.  She was seen by Ellouise Newer on March 15 and given a trial of omeprazole 20 mg twice daily as well as Colestid 1 g twice a day.  She is also using hyoscyamine.  She states her stool frequency is significantly improved on Colestid and she is happy with this.  She is having 2-3 bowel movements per day at this point.  She states stool frequency was previously higher.  She continues to have some nausea but no vomiting.  She states certain foods are much easier for her to handle, such as fruits and vegetables, she can eat these without any symptoms at all.  She continues to have some right mid abdominal discomfort which comes and goes, often worse with eating.  She denies any weight loss.  She continues to have some lower abdominal cramps can be improved with a bowel movement.  Since her last visit she has had a negative GI pathogen panel and parasites and C. difficile testing.  CT scan was done last month after she had acute onset of symptoms which did not show any pathology.  Labs are unremarkable other than mild normocytic anemia.  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 Ileum normal. No polyps or evidence of colitis. Biopsies not taken.  EGD 08/05/2015 - mild gastritis on path, mild  esophagitis with esophagus with normal path, normal duodenum on path   Past Medical History:  Diagnosis Date  . Asthma   . Breast cancer ([place])    2002, left, encapsulated, microcalcifications. lumpectomy, radtiation x 30  . Breast cancer in female [facility])   . Change in mole 06/08/2016  . Colitis   . Decreased visual acuity 01/16/2017  . Dyspnea 11/04/2016  . Dysuria 11/04/2016  . Gluten intolerance   . History of chicken pox   . History of Helicobacter pylori infection 08/01/2015  . Hyperlipidemia   . Hypertension   . Hypothyroid   . IBS (irritable bowel syndrome)   . Left leg pain 03/01/2017  . Low back pain 08/10/2015  . Neck pain 03/01/2017  . Osteoporosis   . RLQ discomfort 03/01/2017  . Stomach cramps   . Urinary tract infection 11/04/2016     Past Surgical History:  Procedure Laterality Date  . ABDOMINAL HYSTERECTOMY     partial  . APPENDECTOMY    . BREAST LUMPECTOMY Left 2002  . CHOLECYSTECTOMY  Age 62 or 59  . COLONOSCOPY  2017  . FRACTURE SURGERY    . TONSILLECTOMY     Family History  Problem Relation Age of Onset  . Colitis Mother   . Irritable bowel syndrome Mother   . Hypertension Father   . Hyperlipidemia Brother   . Heart attack Brother   . Stomach cancer Paternal  Grandmother 53  . Hyperlipidemia Brother   . Heart attack Brother   . Hyperlipidemia Brother   . Heart attack Brother   . Hyperlipidemia Sister   . Leukemia Daughter   . Arthritis Daughter   . Heart disease Daughter        ASD vs VSD  . Colon cancer Neg Hx    Social History   Tobacco Use  . Smoking status: Never Smoker  . Smokeless tobacco: Never Used  Substance Use Topics  . Alcohol use: No    Alcohol/week: 0.0 oz  . Drug use: No   Current Outpatient Medications  Medication Sig Dispense Refill  . albuterol (PROVENTIL HFA;VENTOLIN HFA) 108 (90 Base) MCG/ACT inhaler Inhale 2 puffs into the lungs every 4 (four) hours as needed for wheezing or shortness of breath.    Marland Kitchen albuterol  (PROVENTIL) (2.5 MG/3ML) 0.083% nebulizer solution Take 3 mLs (2.5 mg total) by nebulization every 6 (six) hours as needed for wheezing or shortness of breath. 150 mL 1  . aspirin 81 MG tablet Take 81 mg by mouth daily.    . beclomethasone (QVAR REDIHALER) 80 MCG/ACT inhaler Inhale 2 puffs into the lungs 2 (two) times daily. 1 Inhaler 0  . benzonatate (TESSALON) 100 MG capsule Take 1 capsule (100 mg total) by mouth 3 (three) times daily as needed for cough. 30 capsule 0  . citalopram (CELEXA) 20 MG tablet Take 1 tablet (20 mg total) by mouth daily. 90 tablet 1  . colestipol (COLESTID) 1 g tablet Take 1 tablet (1 g total) by mouth 2 (two) times daily. 60 tablet 1  . Collagen-Vitamin C (COLLAGEN PLUS VITAMIN C PO) Take 1 tablet by mouth daily.    . fluticasone (FLONASE) 50 MCG/ACT nasal spray Place 2 sprays into both nostrils daily. 16 g 1  . hydrocortisone (ANUSOL-HC) 2.5 % rectal cream Place 1 application rectally 2 (two) times daily. 30 g 0  . hyoscyamine (LEVSIN SL) 0.125 MG SL tablet Place 0.125 mg under the tongue every 4 (four) hours as needed for cramping.    Marland Kitchen levothyroxine (SYNTHROID, LEVOTHROID) 50 MCG tablet TAKE 1 TABLET BY MOUTH ONCE DAILY 90 tablet 1  . lisinopril (PRINIVIL,ZESTRIL) 20 MG tablet Take 1 tablet (20 mg total) by mouth daily. 30 tablet 3  . methylPREDNISolone (MEDROL) 4 MG tablet 5 tab po qd X 1d then 4 tab po qd X 1d then 3 tab po qd X 1d then 2 tab po qd then 1 tab po qd 15 tablet 0  . metoprolol succinate (TOPROL XL) 25 MG 24 hr tablet Take 0.5 tablets (12.5 mg total) by mouth daily. 45 tablet 3  . Multiple Vitamins-Minerals (MULTIVITAMIN ADULT PO) Take 1 tablet by mouth daily.    Marland Kitchen omeprazole (PRILOSEC) 20 MG capsule Take 1 tab by mouth before breakfast and dinner. 60 capsule 3  . promethazine-dextromethorphan (PROMETHAZINE-DM) 6.25-15 MG/5ML syrup Take 5 mLs by mouth 4 (four) times daily as needed. 118 mL 0  . ranitidine (ZANTAC) 150 MG capsule Take 1 capsule (150 mg  total) by mouth 2 (two) times daily. 60 capsule 0  . Vitamin D, Ergocalciferol, (DRISDOL) 50000 units CAPS capsule Take 1 capsule (50,000 Units total) by mouth every 7 (seven) days. 12 capsule 1   No current facility-administered medications for this visit.    No Known Allergies   Review of Systems: All systems reviewed and negative except where noted in HPI.   Lab Results  Component Value Date   WBC  4.6 08/02/2017   HGB 11.4 (L) 08/02/2017   HCT 33.5 (L) 08/02/2017   MCV 90.5 08/02/2017   PLT 221.0 08/02/2017    Lab Results  Component Value Date   CREATININE 1.15 08/02/2017   BUN 23 08/02/2017   NA 137 08/02/2017   K 4.3 08/02/2017   CL 102 08/02/2017   CO2 27 08/02/2017    Lab Results  Component Value Date   ALT 16 08/02/2017   AST 24 08/02/2017   ALKPHOS 43 08/02/2017   BILITOT 0.6 08/02/2017     Physical Exam: BP (!) 116/54   Pulse 72   Ht 4\' 11"  (1.499 m)   Wt 127 lb (57.6 kg)   BMI 25.65 kg/m  Constitutional: Pleasant,well-developed, female in no acute distress. HEENT: Normocephalic and atraumatic. Conjunctivae are normal. No scleral icterus. Neck supple.  Cardiovascular: Normal rate, regular rhythm.  Pulmonary/chest: Effort normal and breath sounds normal. No wheezing, rales or rhonchi. Abdominal: Soft, nondistended, nontender. There are no masses palpable. No hepatomegaly. Extremities: no edema Lymphadenopathy: No cervical adenopathy noted. Neurological: Alert and oriented to person place and time. Skin: Skin is warm and dry. No rashes noted. Psychiatric: Normal mood and affect. Behavior is normal.   ASSESSMENT AND PLAN: 77 year old female with a history of suspected IBS, presenting with 1 month worth of worsening diarrhea, dyspepsia, abdominal cramping.  Overall she thinks she is slowly improving, although continues to have some symptoms.  Colestid has significantly helped her diarrhea, and omeprazole has helped as well.  I suspect she may have had  an acute GI infectious gastroenteritis about a month ago which flared these symptoms, perhaps now with some postinfectious functional symptoms which are slowly improving.  She had a negative infectious workup, negative labs, CT scan normal.  I reassured her given the findings to date.   Recommend the following: - continue colestid - zofran 4mg  ODT q 6 HRS PRN nausea - trial of FD gard for dyspepsia and IB gard for her lower abdominal cramps - continue levsin PRN - contact me in one month if no improvement. If symptoms persist may consider further endoscopic evaluation.   She agreed with the plan.   Reston Cellar, MD [facility] Pager [phone]  CC: [name], MD

## 2017-08-23 DIAGNOSIS — M542 Cervicalgia: Secondary | ICD-10-CM | POA: Diagnosis not present

## 2017-08-23 DIAGNOSIS — M9901 Segmental and somatic dysfunction of cervical region: Secondary | ICD-10-CM | POA: Diagnosis not present

## 2017-08-23 DIAGNOSIS — M9902 Segmental and somatic dysfunction of thoracic region: Secondary | ICD-10-CM | POA: Diagnosis not present

## 2017-08-23 DIAGNOSIS — M47812 Spondylosis without myelopathy or radiculopathy, cervical region: Secondary | ICD-10-CM | POA: Diagnosis not present

## 2017-08-24 DIAGNOSIS — M542 Cervicalgia: Secondary | ICD-10-CM | POA: Diagnosis not present

## 2017-08-24 DIAGNOSIS — M47812 Spondylosis without myelopathy or radiculopathy, cervical region: Secondary | ICD-10-CM | POA: Diagnosis not present

## 2017-08-24 DIAGNOSIS — M9901 Segmental and somatic dysfunction of cervical region: Secondary | ICD-10-CM | POA: Diagnosis not present

## 2017-08-24 DIAGNOSIS — M9902 Segmental and somatic dysfunction of thoracic region: Secondary | ICD-10-CM | POA: Diagnosis not present

## 2017-08-25 DIAGNOSIS — M47812 Spondylosis without myelopathy or radiculopathy, cervical region: Secondary | ICD-10-CM | POA: Diagnosis not present

## 2017-08-25 DIAGNOSIS — M9902 Segmental and somatic dysfunction of thoracic region: Secondary | ICD-10-CM | POA: Diagnosis not present

## 2017-08-25 DIAGNOSIS — M9901 Segmental and somatic dysfunction of cervical region: Secondary | ICD-10-CM | POA: Diagnosis not present

## 2017-08-25 DIAGNOSIS — M542 Cervicalgia: Secondary | ICD-10-CM | POA: Diagnosis not present

## 2017-08-30 ENCOUNTER — Encounter: Payer: Self-pay | Admitting: Family Medicine

## 2017-08-30 ENCOUNTER — Ambulatory Visit (INDEPENDENT_AMBULATORY_CARE_PROVIDER_SITE_OTHER): Payer: Medicare HMO | Admitting: Family Medicine

## 2017-08-30 VITALS — BP 118/60 | HR 62 | Resp 16 | Ht 59.0 in | Wt 127.8 lb

## 2017-08-30 DIAGNOSIS — I10 Essential (primary) hypertension: Secondary | ICD-10-CM | POA: Diagnosis not present

## 2017-08-30 DIAGNOSIS — M9901 Segmental and somatic dysfunction of cervical region: Secondary | ICD-10-CM | POA: Diagnosis not present

## 2017-08-30 DIAGNOSIS — R739 Hyperglycemia, unspecified: Secondary | ICD-10-CM

## 2017-08-30 DIAGNOSIS — J101 Influenza due to other identified influenza virus with other respiratory manifestations: Secondary | ICD-10-CM

## 2017-08-30 DIAGNOSIS — M47812 Spondylosis without myelopathy or radiculopathy, cervical region: Secondary | ICD-10-CM | POA: Diagnosis not present

## 2017-08-30 DIAGNOSIS — R109 Unspecified abdominal pain: Secondary | ICD-10-CM

## 2017-08-30 DIAGNOSIS — M9902 Segmental and somatic dysfunction of thoracic region: Secondary | ICD-10-CM | POA: Diagnosis not present

## 2017-08-30 DIAGNOSIS — E039 Hypothyroidism, unspecified: Secondary | ICD-10-CM

## 2017-08-30 DIAGNOSIS — M542 Cervicalgia: Secondary | ICD-10-CM | POA: Diagnosis not present

## 2017-08-30 MED ORDER — OSELTAMIVIR PHOSPHATE 75 MG PO CAPS: 75.0000 mg | ORAL_CAPSULE | Freq: Every day | ORAL | 0 refills | Status: DC

## 2017-08-30 NOTE — Patient Instructions (Signed)

## 2017-08-31 LAB — URINALYSIS
BILIRUBIN URINE: NEGATIVE
HGB URINE DIPSTICK: NEGATIVE
Ketones, ur: NEGATIVE
Leukocytes, UA: NEGATIVE
NITRITE: NEGATIVE
Specific Gravity, Urine: 1.02 (ref 1.000–1.030)
TOTAL PROTEIN, URINE-UPE24: NEGATIVE
UROBILINOGEN UA: 0.2 (ref 0.0–1.0)
Urine Glucose: NEGATIVE
pH: 5.5 (ref 5.0–8.0)

## 2017-08-31 LAB — URINE CULTURE
MICRO NUMBER:: 90436227
SPECIMEN QUALITY:: ADEQUATE

## 2017-09-01 DIAGNOSIS — M47812 Spondylosis without myelopathy or radiculopathy, cervical region: Secondary | ICD-10-CM | POA: Diagnosis not present

## 2017-09-01 DIAGNOSIS — M9901 Segmental and somatic dysfunction of cervical region: Secondary | ICD-10-CM | POA: Diagnosis not present

## 2017-09-01 DIAGNOSIS — M542 Cervicalgia: Secondary | ICD-10-CM | POA: Diagnosis not present

## 2017-09-01 DIAGNOSIS — M9902 Segmental and somatic dysfunction of thoracic region: Secondary | ICD-10-CM | POA: Diagnosis not present

## 2017-09-04 DIAGNOSIS — J111 Influenza due to unidentified influenza virus with other respiratory manifestations: Secondary | ICD-10-CM | POA: Insufficient documentation

## 2017-09-04 DIAGNOSIS — J101 Influenza due to other identified influenza virus with other respiratory manifestations: Secondary | ICD-10-CM | POA: Insufficient documentation

## 2017-09-04 NOTE — Assessment & Plan Note (Signed)
On Levothyroxine, continue to monitor 

## 2017-09-04 NOTE — Assessment & Plan Note (Signed)
Greatly improved. Encouraged to maintain a bland diet, hydrate well. Increased fiber and report worsening symptoms

## 2017-09-04 NOTE — Progress Notes (Signed)
Patient ID: Julie Wilkerson, female   DOB: 1941-04-25, 77 y.o.   MRN: [id number]   Subjective:    Patient ID: Julie Wilkerson, female    DOB: [birth year], 77 y.o.   MRN: [id number]  Chief Complaint  Patient presents with  . Abdominal Pain    4 week follow up, some improvement    HPI Patient is in today for folllow up and she is feeling much better. She still has some abdominal tenderness but it is greatly improved. She has been seen by gastroenterology and they are following with [name]. She finds the IBgard helpful and has used the Hyoscyamine and Ondansetron prn with good results. Denies CP/palp/SOB/HA/congestion/fevers or GU c/o. Taking meds as prescribed. No fevers or acute illness  Past Medical History:  Diagnosis Date  . Asthma   . Breast cancer ([place])    2002, left, encapsulated, microcalcifications. lumpectomy, radtiation x 30  . Breast cancer in female [facility])   . Change in mole 06/08/2016  . Colitis   . Decreased visual acuity 01/16/2017  . Dyspnea 11/04/2016  . Dysuria 11/04/2016  . Gluten intolerance   . History of chicken pox   . History of Helicobacter pylori infection 08/01/2015  . Hyperlipidemia   . Hypertension   . Hypothyroid   . IBS (irritable bowel syndrome)   . Left leg pain 03/01/2017  . Low back pain 08/10/2015  . Neck pain 03/01/2017  . Osteoporosis   . RLQ discomfort 03/01/2017  . Stomach cramps   . Urinary tract infection 11/04/2016    Past Surgical History:  Procedure Laterality Date  . ABDOMINAL HYSTERECTOMY     partial  . APPENDECTOMY    . BREAST LUMPECTOMY Left 2002  . CHOLECYSTECTOMY  Age 31 or 34  . COLONOSCOPY  2017  . FRACTURE SURGERY    . TONSILLECTOMY      Family History  Problem Relation Age of Onset  . Colitis Mother   . Irritable bowel syndrome Mother   . Hypertension Father   . Hyperlipidemia Brother   . Heart attack Brother   . Stomach cancer Paternal Grandmother 10  . Hyperlipidemia Brother   . Heart attack Brother   . Hyperlipidemia Brother    . Heart attack Brother   . Hyperlipidemia Sister   . Leukemia Daughter   . Arthritis Daughter   . Heart disease Daughter        ASD vs VSD  . Colon cancer Neg Hx     Social History   Socioeconomic History  . Marital status: Single    Spouse name: Not on file  . Number of children: Not on file  . Years of education: Not on file  . Highest education level: Not on file  Occupational History  . Occupation: retired    Comment: Pharmacist, hospital, kindergarten  Social Needs  . Financial resource strain: Not on file  . Food insecurity:    Worry: Not on file    Inability: Not on file  . Transportation needs:    Medical: Not on file    Non-medical: Not on file  Tobacco Use  . Smoking status: Never Smoker  . Smokeless tobacco: Never Used  Substance and Sexual Activity  . Alcohol use: No    Alcohol/week: 0.0 oz  . Drug use: No  . Sexual activity: Never    Comment: lives alone, avoids daiiry and gluten. 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 Physical activity:    Days per week: Not on file  Minutes per session: Not on file  . Stress: Not on file  Relationships  . Social connections:    Talks on phone: Not on file    Gets together: Not on file    Attends religious service: Not on file    Active member of club or organization: Not on file    Attends meetings of clubs or organizations: Not on file    Relationship status: Not on file  . Intimate partner violence:    Fear of current or ex partner: Not on file    Emotionally abused: Not on file    Physically abused: Not on file    Forced sexual activity: Not on file  Other Topics Concern  . Not on file  Social History Narrative  . Not on file    Outpatient Medications Prior to Visit  Medication Sig Dispense Refill  . albuterol (PROVENTIL HFA;VENTOLIN HFA) 108 (90 Base) MCG/ACT inhaler Inhale 2 puffs into the lungs every 4 (four) hours as needed for wheezing or shortness of breath.    Marland Kitchen albuterol (PROVENTIL) (2.5 MG/3ML) 0.083%  nebulizer solution Take 3 mLs (2.5 mg total) by nebulization every 6 (six) hours as needed for wheezing or shortness of breath. 150 mL 1  . AMBULATORY NON FORMULARY MEDICATION Medication Name: FD gard take take 2 times daily before meals. 12 capsule 0  . AMBULATORY NON FORMULARY MEDICATION Medication Name: IBGard as needed 12 capsule 0  . aspirin 81 MG tablet Take 81 mg by mouth daily.    . beclomethasone (QVAR REDIHALER) 80 MCG/ACT inhaler Inhale 2 puffs into the lungs 2 (two) times daily. 1 Inhaler 0  . benzonatate (TESSALON) 100 MG capsule Take 1 capsule (100 mg total) by mouth 3 (three) times daily as needed for cough. 30 capsule 0  . citalopram (CELEXA) 20 MG tablet Take 1 tablet (20 mg total) by mouth daily. 90 tablet 1  . colestipol (COLESTID) 1 g tablet Take 1 tablet (1 g total) by mouth 2 (two) times daily. 60 tablet 1  . Collagen-Vitamin C (COLLAGEN PLUS VITAMIN C PO) Take 1 tablet by mouth daily.    . fluticasone (FLONASE) 50 MCG/ACT nasal spray Place 2 sprays into both nostrils daily. 16 g 1  . hydrocortisone (ANUSOL-HC) 2.5 % rectal cream Place 1 application rectally 2 (two) times daily. 30 g 0  . hyoscyamine (LEVSIN SL) 0.125 MG SL tablet Place 0.125 mg under the tongue every 4 (four) hours as needed for cramping.    Marland Kitchen levothyroxine (SYNTHROID, LEVOTHROID) 50 MCG tablet TAKE 1 TABLET BY MOUTH ONCE DAILY 90 tablet 1  . lisinopril (PRINIVIL,ZESTRIL) 20 MG tablet Take 1 tablet (20 mg total) by mouth daily. 30 tablet 3  . methylPREDNISolone (MEDROL) 4 MG tablet 5 tab po qd X 1d then 4 tab po qd X 1d then 3 tab po qd X 1d then 2 tab po qd then 1 tab po qd 15 tablet 0  . metoprolol succinate (TOPROL XL) 25 MG 24 hr tablet Take 0.5 tablets (12.5 mg total) by mouth daily. 45 tablet 3  . Multiple Vitamins-Minerals (MULTIVITAMIN ADULT PO) Take 1 tablet by mouth daily.    Marland Kitchen omeprazole (PRILOSEC) 20 MG capsule Take 1 tab by mouth before breakfast and dinner. 60 capsule 3  . ondansetron (ZOFRAN  ODT) 4 MG disintegrating tablet Take 1 tablet (4 mg total) by mouth every 6 (six) hours as needed for nausea or vomiting. 30 tablet 1  . promethazine-dextromethorphan (PROMETHAZINE-DM) 6.25-15 MG/5ML syrup  Take 5 mLs by mouth 4 (four) times daily as needed. 118 mL 0  . ranitidine (ZANTAC) 150 MG capsule Take 1 capsule (150 mg total) by mouth 2 (two) times daily. 60 capsule 0  . Vitamin D, Ergocalciferol, (DRISDOL) 50000 units CAPS capsule Take 1 capsule (50,000 Units total) by mouth every 7 (seven) days. 12 capsule 1   No facility-administered medications prior to visit.     No Known Allergies  Review of Systems  Constitutional: Positive for malaise/fatigue. Negative for fever.  HENT: Negative for congestion.   Eyes: Negative for blurred vision.  Respiratory: Negative for shortness of breath.   Cardiovascular: Negative for chest pain, palpitations and leg swelling.  Gastrointestinal: Positive for abdominal pain. Negative for blood in stool, constipation, diarrhea, melena and vomiting.  Genitourinary: Negative for dysuria and frequency.  Musculoskeletal: Negative for falls.  Skin: Negative for rash.  Neurological: Negative for dizziness, loss of consciousness and headaches.  Endo/Heme/Allergies: Negative for environmental allergies.  Psychiatric/Behavioral: Negative for depression. The patient is not nervous/anxious.        Objective:    Physical Exam  Constitutional: She is oriented to person, place, and time. She appears well-developed and well-nourished. No distress.  HENT:  Head: Normocephalic and atraumatic.  Nose: Nose normal.  Eyes: Right eye exhibits no discharge. Left eye exhibits no discharge.  Neck: Normal range of motion. Neck supple.  Cardiovascular: Normal rate and regular rhythm.  No murmur heard. Pulmonary/Chest: Effort normal and breath sounds normal.  Abdominal: Soft. Bowel sounds are normal. There is no tenderness.  Musculoskeletal: She exhibits no edema.    Neurological: She is alert and oriented to person, place, and time.  Skin: Skin is warm and dry.  Psychiatric: She has a normal mood and affect.  Nursing note and vitals reviewed.   BP 118/60 (BP Location: Left Arm, Patient Position: Sitting, Cuff Size: Normal)   Pulse 62   Resp 16   Ht 4\' 11"  (1.499 m)   Wt 127 lb 12.8 oz (58 kg)   SpO2 95%   BMI 25.81 kg/m  Wt Readings from Last 3 Encounters:  08/30/17 127 lb 12.8 oz (58 kg)  08/19/17 127 lb (57.6 kg)  08/08/17 126 lb 6.4 oz (57.3 kg)     Lab Results  Component Value Date   WBC 4.6 08/02/2017   HGB 11.4 (L) 08/02/2017   HCT 33.5 (L) 08/02/2017   PLT 221.0 08/02/2017   GLUCOSE 123 (H) 08/02/2017   CHOL 236 (H) 03/01/2017   TRIG 116.0 03/01/2017   HDL 72.70 03/01/2017   LDLCALC 140 (H) 03/01/2017   ALT 16 08/02/2017   AST 24 08/02/2017   NA 137 08/02/2017   K 4.3 08/02/2017   CL 102 08/02/2017   CREATININE 1.15 08/02/2017   BUN 23 08/02/2017   CO2 27 08/02/2017   TSH 2.42 03/01/2017   HGBA1C 6.0 03/01/2017    Lab Results  Component Value Date   TSH 2.42 03/01/2017   Lab Results  Component Value Date   WBC 4.6 08/02/2017   HGB 11.4 (L) 08/02/2017   HCT 33.5 (L) 08/02/2017   MCV 90.5 08/02/2017   PLT 221.0 08/02/2017   Lab Results  Component Value Date   NA 137 08/02/2017   K 4.3 08/02/2017   CO2 27 08/02/2017   GLUCOSE 123 (H) 08/02/2017   BUN 23 08/02/2017   CREATININE 1.15 08/02/2017   BILITOT 0.6 08/02/2017   ALKPHOS 43 08/02/2017   AST 24 08/02/2017  ALT 16 08/02/2017   PROT 6.8 08/02/2017   ALBUMIN 4.0 08/02/2017   CALCIUM 9.2 08/02/2017   ANIONGAP 7 07/19/2017   GFR 48.80 (L) 08/02/2017   Lab Results  Component Value Date   CHOL 236 (H) 03/01/2017   Lab Results  Component Value Date   HDL 72.70 03/01/2017   Lab Results  Component Value Date   LDLCALC 140 (H) 03/01/2017   Lab Results  Component Value Date   TRIG 116.0 03/01/2017   Lab Results  Component Value Date    CHOLHDL 3 03/01/2017   Lab Results  Component Value Date   HGBA1C 6.0 03/01/2017       Assessment & Plan:   Problem List Items Addressed This Visit    Essential hypertension    Well controlled, no changes to meds. Encouraged heart healthy diet such as the DASH diet and exercise as tolerated.       Hypothyroid    On Levothyroxine, continue to monitor      Hyperglycemia    hgba1c acceptable, minimize simple carbs. Increase exercise as tolerated.       Abdominal pain - Primary    Greatly improved. Encouraged to maintain a bland diet, hydrate well. Increased fiber and report worsening symptoms      Relevant Orders   Urinalysis (Completed)   Urine Culture (Completed)   Influenza due to identified influenza virus    She cares for her 40 year old grand daughter who has just been diagnosed with influenza. Is given a prophylactic course of Tamiflu      Relevant Medications   oseltamivir (TAMIFLU) 75 MG capsule      I am having Jamison Neighbor start on oseltamivir. I am also having her maintain her Multiple Vitamins-Minerals (MULTIVITAMIN ADULT PO), Collagen-Vitamin C (COLLAGEN PLUS VITAMIN C PO), aspirin, Vitamin D (Ergocalciferol), beclomethasone, citalopram, albuterol, hyoscyamine, metoprolol succinate, lisinopril, promethazine-dextromethorphan, albuterol, levothyroxine, methylPREDNISolone, ranitidine, hydrocortisone, omeprazole, colestipol, fluticasone, benzonatate, ondansetron, AMBULATORY NON FORMULARY MEDICATION, and AMBULATORY NON FORMULARY MEDICATION.  Meds ordered this encounter  Medications  . oseltamivir (TAMIFLU) 75 MG capsule    Sig: Take 1 capsule (75 mg total) by mouth daily.    Dispense:  7 capsule    Refill:  0     Penni Homans, MD

## 2017-09-04 NOTE — Assessment & Plan Note (Signed)
hgba1c acceptable, minimize simple carbs. Increase exercise as tolerated.  

## 2017-09-04 NOTE — Assessment & Plan Note (Signed)
Well controlled, no changes to meds. Encouraged heart healthy diet such as the DASH diet and exercise as tolerated.  °

## 2017-09-04 NOTE — Assessment & Plan Note (Signed)
She cares for her 76 year old grand daughter who has just been diagnosed with influenza. Is given a prophylactic course of Tamiflu

## 2017-09-06 DIAGNOSIS — M542 Cervicalgia: Secondary | ICD-10-CM | POA: Diagnosis not present

## 2017-09-06 DIAGNOSIS — M9901 Segmental and somatic dysfunction of cervical region: Secondary | ICD-10-CM | POA: Diagnosis not present

## 2017-09-06 DIAGNOSIS — M9902 Segmental and somatic dysfunction of thoracic region: Secondary | ICD-10-CM | POA: Diagnosis not present

## 2017-09-06 DIAGNOSIS — M47812 Spondylosis without myelopathy or radiculopathy, cervical region: Secondary | ICD-10-CM | POA: Diagnosis not present

## 2017-09-08 ENCOUNTER — Ambulatory Visit: Payer: Self-pay | Admitting: Physician Assistant

## 2017-09-08 DIAGNOSIS — M47812 Spondylosis without myelopathy or radiculopathy, cervical region: Secondary | ICD-10-CM | POA: Diagnosis not present

## 2017-09-08 DIAGNOSIS — M9901 Segmental and somatic dysfunction of cervical region: Secondary | ICD-10-CM | POA: Diagnosis not present

## 2017-09-08 DIAGNOSIS — M9902 Segmental and somatic dysfunction of thoracic region: Secondary | ICD-10-CM | POA: Diagnosis not present

## 2017-09-08 DIAGNOSIS — M542 Cervicalgia: Secondary | ICD-10-CM | POA: Diagnosis not present

## 2017-09-13 DIAGNOSIS — M9902 Segmental and somatic dysfunction of thoracic region: Secondary | ICD-10-CM | POA: Diagnosis not present

## 2017-09-13 DIAGNOSIS — M9901 Segmental and somatic dysfunction of cervical region: Secondary | ICD-10-CM | POA: Diagnosis not present

## 2017-09-13 DIAGNOSIS — M542 Cervicalgia: Secondary | ICD-10-CM | POA: Diagnosis not present

## 2017-09-13 DIAGNOSIS — M47812 Spondylosis without myelopathy or radiculopathy, cervical region: Secondary | ICD-10-CM | POA: Diagnosis not present

## 2017-09-15 DIAGNOSIS — M9902 Segmental and somatic dysfunction of thoracic region: Secondary | ICD-10-CM | POA: Diagnosis not present

## 2017-09-15 DIAGNOSIS — M47812 Spondylosis without myelopathy or radiculopathy, cervical region: Secondary | ICD-10-CM | POA: Diagnosis not present

## 2017-09-15 DIAGNOSIS — M542 Cervicalgia: Secondary | ICD-10-CM | POA: Diagnosis not present

## 2017-09-15 DIAGNOSIS — M9901 Segmental and somatic dysfunction of cervical region: Secondary | ICD-10-CM | POA: Diagnosis not present

## 2017-09-19 DIAGNOSIS — M9901 Segmental and somatic dysfunction of cervical region: Secondary | ICD-10-CM | POA: Diagnosis not present

## 2017-09-19 DIAGNOSIS — M47812 Spondylosis without myelopathy or radiculopathy, cervical region: Secondary | ICD-10-CM | POA: Diagnosis not present

## 2017-09-19 DIAGNOSIS — M9902 Segmental and somatic dysfunction of thoracic region: Secondary | ICD-10-CM | POA: Diagnosis not present

## 2017-09-19 DIAGNOSIS — M542 Cervicalgia: Secondary | ICD-10-CM | POA: Diagnosis not present

## 2017-09-20 DIAGNOSIS — M47812 Spondylosis without myelopathy or radiculopathy, cervical region: Secondary | ICD-10-CM | POA: Diagnosis not present

## 2017-09-20 DIAGNOSIS — M9902 Segmental and somatic dysfunction of thoracic region: Secondary | ICD-10-CM | POA: Diagnosis not present

## 2017-09-20 DIAGNOSIS — M9901 Segmental and somatic dysfunction of cervical region: Secondary | ICD-10-CM | POA: Diagnosis not present

## 2017-09-20 DIAGNOSIS — M542 Cervicalgia: Secondary | ICD-10-CM | POA: Diagnosis not present

## 2017-09-27 DIAGNOSIS — M47812 Spondylosis without myelopathy or radiculopathy, cervical region: Secondary | ICD-10-CM | POA: Diagnosis not present

## 2017-09-27 DIAGNOSIS — M9902 Segmental and somatic dysfunction of thoracic region: Secondary | ICD-10-CM | POA: Diagnosis not present

## 2017-09-27 DIAGNOSIS — M9901 Segmental and somatic dysfunction of cervical region: Secondary | ICD-10-CM | POA: Diagnosis not present

## 2017-09-27 DIAGNOSIS — R69 Illness, unspecified: Secondary | ICD-10-CM | POA: Diagnosis not present

## 2017-09-27 DIAGNOSIS — M542 Cervicalgia: Secondary | ICD-10-CM | POA: Diagnosis not present

## 2017-09-28 DIAGNOSIS — M9902 Segmental and somatic dysfunction of thoracic region: Secondary | ICD-10-CM | POA: Diagnosis not present

## 2017-09-28 DIAGNOSIS — M542 Cervicalgia: Secondary | ICD-10-CM | POA: Diagnosis not present

## 2017-09-28 DIAGNOSIS — M9901 Segmental and somatic dysfunction of cervical region: Secondary | ICD-10-CM | POA: Diagnosis not present

## 2017-09-28 DIAGNOSIS — M47812 Spondylosis without myelopathy or radiculopathy, cervical region: Secondary | ICD-10-CM | POA: Diagnosis not present

## 2017-09-28 NOTE — Progress Notes (Unsigned)
Subjective:   Julie Wilkerson was seen in consultation in the movement disorder clinic at the request of [name], MD.  The evaluation is for tremor.  Tremor started approximately 5 years ago and involves the R more than L arm, but sometimes it involves the jaw as well.  Tremor is most noticeable when she uses the arms or holds the arms the arms antigravity.   There is no family hx of tremor.    Tremor characteristics: Affected by caffeine:  unknown (doesn't drink any caffeine) Affected by alcohol:  Unknown (doesn't drink any) Affected by stress:  Yes.   Affected by fatigue:  No. Spills soup if on spoon:  May or may not Spills glass of liquid if full:  Has to hold with both hands Affects ADL's (tying shoes, brushing teeth, etc):  No.  Current/Previously tried tremor medications: metoprolol   Current medications that may exacerbate tremor:  symbicort (for very mild asthma - uses it about 3 times a month) - notes much increased tremor with this.  Also c/o L sided headache.  Has had this "since I was a little girl."  Was diagnosed as migraine as child.  Found beef as trigger and started to get better after d/c beef.  About a month ago, it got worse and felt like ice pick in eye.  Going today for eye appt.  Has phophotophobia and phonophobia.  Has nausea.  No emesis.  Takes tylenol or occasionally advil.    Neuroimaging: performed but years ago and she doesn't know results  Outside reports reviewed: historical medical records and office notes.  09/29/17 update: Patient is seen today in follow-up.  I have not seen her since 2017.  I saw her then for tremor and headache.  I have reviewed records since our last visit.  She returns today to discuss headache.  Headache is on the left side.  Headache is described as ***.  She has photophobia and phonophobia.  She has not had any emesis, but has had some nausea.  CT of the brain was performed on March 29, 2017 for headache.  This was nonacute.   There is atrophy and small vessel disease.  I have reviewed this.  No Known Allergies  Outpatient Encounter Medications as of 09/29/2017  Medication Sig  . albuterol (PROVENTIL HFA;VENTOLIN HFA) 108 (90 Base) MCG/ACT inhaler Inhale 2 puffs into the lungs every 4 (four) hours as needed for wheezing or shortness of breath.  Marland Kitchen albuterol (PROVENTIL) (2.5 MG/3ML) 0.083% nebulizer solution Take 3 mLs (2.5 mg total) by nebulization every 6 (six) hours as needed for wheezing or shortness of breath.  . AMBULATORY NON FORMULARY MEDICATION Medication Name: FD gard take take 2 times daily before meals.  . AMBULATORY NON FORMULARY MEDICATION Medication Name: IBGard as needed  . aspirin 81 MG tablet Take 81 mg by mouth daily.  . beclomethasone (QVAR REDIHALER) 80 MCG/ACT inhaler Inhale 2 puffs into the lungs 2 (two) times daily.  . benzonatate (TESSALON) 100 MG capsule Take 1 capsule (100 mg total) by mouth 3 (three) times daily as needed for cough.  . citalopram (CELEXA) 20 MG tablet Take 1 tablet (20 mg total) by mouth daily.  . colestipol (COLESTID) 1 g tablet Take 1 tablet (1 g total) by mouth 2 (two) times daily.  . Collagen-Vitamin C (COLLAGEN PLUS VITAMIN C PO) Take 1 tablet by mouth daily.  . fluticasone (FLONASE) 50 MCG/ACT nasal spray Place 2 sprays into both nostrils daily.  Marland Kitchen  hydrocortisone (ANUSOL-HC) 2.5 % rectal cream Place 1 application rectally 2 (two) times daily.  . hyoscyamine (LEVSIN SL) 0.125 MG SL tablet Place 0.125 mg under the tongue every 4 (four) hours as needed for cramping.  Marland Kitchen levothyroxine (SYNTHROID, LEVOTHROID) 50 MCG tablet TAKE 1 TABLET BY MOUTH ONCE DAILY  . lisinopril (PRINIVIL,ZESTRIL) 20 MG tablet Take 1 tablet (20 mg total) by mouth daily.  . methylPREDNISolone (MEDROL) 4 MG tablet 5 tab po qd X 1d then 4 tab po qd X 1d then 3 tab po qd X 1d then 2 tab po qd then 1 tab po qd  . metoprolol succinate (TOPROL XL) 25 MG 24 hr tablet Take 0.5 tablets (12.5 mg total) by mouth  daily.  . Multiple Vitamins-Minerals (MULTIVITAMIN ADULT PO) Take 1 tablet by mouth daily.  Marland Kitchen omeprazole (PRILOSEC) 20 MG capsule Take 1 tab by mouth before breakfast and dinner.  . ondansetron (ZOFRAN ODT) 4 MG disintegrating tablet Take 1 tablet (4 mg total) by mouth every 6 (six) hours as needed for nausea or vomiting.  Marland Kitchen oseltamivir (TAMIFLU) 75 MG capsule Take 1 capsule (75 mg total) by mouth daily.  . promethazine-dextromethorphan (PROMETHAZINE-DM) 6.25-15 MG/5ML syrup Take 5 mLs by mouth 4 (four) times daily as needed.  . ranitidine (ZANTAC) 150 MG capsule Take 1 capsule (150 mg total) by mouth 2 (two) times daily.  . Vitamin D, Ergocalciferol, (DRISDOL) 50000 units CAPS capsule Take 1 capsule (50,000 Units total) by mouth every 7 (seven) days.   No facility-administered encounter medications on file as of 09/29/2017.     Past Medical History:  Diagnosis Date  . Asthma   . Breast cancer ([place])    2002, left, encapsulated, microcalcifications. lumpectomy, radtiation x 30  . Breast cancer in female [facility])   . Change in mole 06/08/2016  . Colitis   . Decreased visual acuity 01/16/2017  . Dyspnea 11/04/2016  . Dysuria 11/04/2016  . Gluten intolerance   . History of chicken pox   . History of Helicobacter pylori infection 08/01/2015  . Hyperlipidemia   . Hypertension   . Hypothyroid   . IBS (irritable bowel syndrome)   . Left leg pain 03/01/2017  . Low back pain 08/10/2015  . Neck pain 03/01/2017  . Osteoporosis   . RLQ discomfort 03/01/2017  . Stomach cramps   . Urinary tract infection 11/04/2016    Past Surgical History:  Procedure Laterality Date  . ABDOMINAL HYSTERECTOMY     partial  . APPENDECTOMY    . BREAST LUMPECTOMY Left 2002  . CHOLECYSTECTOMY  Age 61 or 89  . COLONOSCOPY  2017  . FRACTURE SURGERY    . TONSILLECTOMY      Social History   Socioeconomic History  . Marital status: Single    Spouse name: Not on file  . Number of children: Not on file  . Years of  education: Not on file  . Highest education level: Not on file  Occupational History  . Occupation: retired    Comment: Pharmacist, hospital, kindergarten  Social Needs  . Financial resource strain: Not on file  . Food insecurity:    Worry: Not on file    Inability: Not on file  . Transportation needs:    Medical: Not on file    Non-medical: Not on file  Tobacco Use  . Smoking status: Never Smoker  . Smokeless tobacco: Never Used  Substance and Sexual Activity  . Alcohol use: No    Alcohol/week: 0.0 oz  .  Drug use: No  . Sexual activity: Never    Comment: lives alone, avoids daiiry and gluten. 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 Physical activity:    Days per week: Not on file    Minutes per session: Not on file  . Stress: Not on file  Relationships  . Social connections:    Talks on phone: Not on file    Gets together: Not on file    Attends religious service: Not on file    Active member of club or organization: Not on file    Attends meetings of clubs or organizations: Not on file    Relationship status: Not on file  . Intimate partner violence:    Fear of current or ex partner: Not on file    Emotionally abused: Not on file    Physically abused: Not on file    Forced sexual activity: Not on file  Other Topics Concern  . Not on file  Social History Narrative  . Not on file    Family Status  Relation Name Status  . Mother  Deceased at age 16  . Father  Deceased  . Brother  24, age 71y  . PGM  Deceased  . Brother  Alive, age 96y  . Brother  Alive, age 38y  . Sister  Alive, age 67y  . Daughter  Alive, age 41y  . MGM  Deceased  . MGF  Deceased  . PGF  Deceased  . Daughter 54 Alive  . Daughter 58 Alive  . Neg Hx  (Not Specified)    Review of Systems A complete 10 system ROS was obtained and was negative apart from what is mentioned.   Objective:   VITALS:   There were no vitals filed for this visit. Gen:  Appears stated age and in NAD. HEENT:   Normocephalic, atraumatic. The mucous membranes are moist. The superficial temporal arteries are without ropiness or tenderness. Cardiovascular: Bradycardia with regular rhythm. Lungs: Clear to auscultation bilaterally. Neck: There are no carotid bruits noted bilaterally.  NEUROLOGICAL:  Orientation:  The patient is alert and oriented x 3.  Recent and remote memory are intact.  Attention span and concentration are normal.  Able to name objects and repeat without trouble.  Fund of knowledge is appropriate Cranial nerves: There is good facial symmetry. The pupils are equal round and reactive to light bilaterally. Fundoscopic exam reveals clear disc margins bilaterally. Extraocular muscles are intact and visual fields are full to confrontational testing. Speech is fluent and clear. Soft palate rises symmetrically and there is no tongue deviation. Hearing is intact to conversational tone. Tone: Tone is good throughout. Sensation: Sensation is intact to light touch and pinprick throughout (facial, trunk, extremities). Vibration is intact at the bilateral big toe. There is no extinction with double simultaneous stimulation. There is no sensory dermatomal level identified. Coordination:  The patient has no dysdiadichokinesia or dysmetria. Motor: Strength is 5/5 in the bilateral upper and lower extremities.  Shoulder shrug is equal bilaterally.  There is no pronator drift.  There are no fasciculations noted. DTR's: Deep tendon reflexes are 2+/4 at the bilateral biceps, triceps, brachioradialis, patella and achilles.  Plantar responses are downgoing bilaterally. Gait and Station: The patient arises easily out of the chair.  Gait is a bit antalgic.    MOVEMENT EXAM: Tremor:  There is tremor in the UE, noted most significantly with action.  It is a little worse in the R than the L.  It is  moderate in the right.   The patient is has evidence of tremor with Archimedes spirals on the right.  There is no tremor at  rest.  There is intermittent chin tremor. The patient is able to pour water from one glass to another without spilling it but she actually has tremor evident but she compensates well.  Labs:  Lab Results  Component Value Date   TSH 2.42 03/01/2017     Chemistry      Component Value Date/Time   NA 137 08/02/2017 0955   K 4.3 08/02/2017 0955   CL 102 08/02/2017 0955   CO2 27 08/02/2017 0955   BUN 23 08/02/2017 0955   CREATININE 1.15 08/02/2017 0955   CREATININE 1.20 (H) 07/15/2017 1603      Component Value Date/Time   CALCIUM 9.2 08/02/2017 0955   ALKPHOS 43 08/02/2017 0955   AST 24 08/02/2017 0955   ALT 16 08/02/2017 0955   BILITOT 0.6 08/02/2017 0955          Assessment/Plan:   1.  Essential Tremor.  -This is evidenced by the symmetrical nature and longstanding hx of gradually getting worse.  We discussed nature and pathophysiology.  We discussed that this can continue to gradually get worse with time.  We discussed that some medications can worsen this, as can caffeine use.  We discussed medication therapy as well as surgical therapy.  Cannot increase her metoprolol due to bradycardia.  Try primidone. Risks, benefits, side effects and alternative therapies were discussed.  The opportunity to ask questions was given and they were answered to the best of my ability.  The patient expressed understanding and willingness to follow the outlined treatment protocols. 2.  Migraine without aura, L eye  -long hx of migraine and this is her typical migraine but has started back after long time of being gone.  Was going to just change the metoprolol to inderal for migraine prophylaxis and tremor but too bradycardic.  Instead, will try short course of medrol and see if just can break headache cycle.  I did tell her that this will cause tremor when she is taking this.  Told her to take this before she starts the primidone.    -will do MRI brain  -if no help, we can discuss other prophylactics  or abortives, if needed 3.  Follow up is anticipated in the next few months, sooner should new neurologic issues arise.  Much greater than 50% of this visit was spent in counseling and coordinating care.  Total face to face time:  45 min    CC:  [name], MD

## 2017-09-29 ENCOUNTER — Ambulatory Visit: Payer: Medicare HMO | Admitting: Neurology

## 2017-10-04 DIAGNOSIS — M9902 Segmental and somatic dysfunction of thoracic region: Secondary | ICD-10-CM | POA: Diagnosis not present

## 2017-10-04 DIAGNOSIS — M47812 Spondylosis without myelopathy or radiculopathy, cervical region: Secondary | ICD-10-CM | POA: Diagnosis not present

## 2017-10-04 DIAGNOSIS — M542 Cervicalgia: Secondary | ICD-10-CM | POA: Diagnosis not present

## 2017-10-04 DIAGNOSIS — M9901 Segmental and somatic dysfunction of cervical region: Secondary | ICD-10-CM | POA: Diagnosis not present

## 2017-10-06 DIAGNOSIS — M47812 Spondylosis without myelopathy or radiculopathy, cervical region: Secondary | ICD-10-CM | POA: Diagnosis not present

## 2017-10-06 DIAGNOSIS — M9901 Segmental and somatic dysfunction of cervical region: Secondary | ICD-10-CM | POA: Diagnosis not present

## 2017-10-06 DIAGNOSIS — M9902 Segmental and somatic dysfunction of thoracic region: Secondary | ICD-10-CM | POA: Diagnosis not present

## 2017-10-06 DIAGNOSIS — M542 Cervicalgia: Secondary | ICD-10-CM | POA: Diagnosis not present

## 2017-10-18 ENCOUNTER — Ambulatory Visit (HOSPITAL_BASED_OUTPATIENT_CLINIC_OR_DEPARTMENT_OTHER)
Admission: RE | Admit: 2017-10-18 | Discharge: 2017-10-18 | Disposition: A | Discharge status: Home / Self Care | Payer: Medicare HMO | Source: Ambulatory Visit | Attending: Family Medicine | Admitting: Family Medicine

## 2017-10-18 ENCOUNTER — Ambulatory Visit (INDEPENDENT_AMBULATORY_CARE_PROVIDER_SITE_OTHER): Payer: Medicare HMO | Admitting: Family Medicine

## 2017-10-18 VITALS — BP 118/68 | HR 50 | Temp 97.8°F | Resp 18 | Wt 124.5 lb

## 2017-10-18 DIAGNOSIS — M25551 Pain in right hip: Secondary | ICD-10-CM

## 2017-10-18 DIAGNOSIS — E039 Hypothyroidism, unspecified: Secondary | ICD-10-CM | POA: Diagnosis not present

## 2017-10-18 DIAGNOSIS — K589 Irritable bowel syndrome without diarrhea: Secondary | ICD-10-CM | POA: Diagnosis not present

## 2017-10-18 DIAGNOSIS — M545 Low back pain: Secondary | ICD-10-CM | POA: Diagnosis not present

## 2017-10-18 DIAGNOSIS — E782 Mixed hyperlipidemia: Secondary | ICD-10-CM

## 2017-10-18 DIAGNOSIS — M25552 Pain in left hip: Secondary | ICD-10-CM | POA: Diagnosis not present

## 2017-10-18 DIAGNOSIS — R700 Elevated erythrocyte sedimentation rate: Secondary | ICD-10-CM | POA: Diagnosis not present

## 2017-10-18 DIAGNOSIS — J45991 Cough variant asthma: Secondary | ICD-10-CM | POA: Diagnosis not present

## 2017-10-18 DIAGNOSIS — M419 Scoliosis, unspecified: Secondary | ICD-10-CM | POA: Insufficient documentation

## 2017-10-18 DIAGNOSIS — M5136 Other intervertebral disc degeneration, lumbar region: Secondary | ICD-10-CM | POA: Insufficient documentation

## 2017-10-18 DIAGNOSIS — I10 Essential (primary) hypertension: Secondary | ICD-10-CM | POA: Diagnosis not present

## 2017-10-18 DIAGNOSIS — R739 Hyperglycemia, unspecified: Secondary | ICD-10-CM

## 2017-10-18 MED ORDER — ALENDRONATE SODIUM 70 MG PO TABS: 70.0000 mg | ORAL_TABLET | ORAL | 3 refills | Status: DC

## 2017-10-18 NOTE — Assessment & Plan Note (Signed)
Well controlled, no changes to meds. Encouraged heart healthy diet such as the DASH diet and exercise as tolerated.  °

## 2017-10-18 NOTE — Assessment & Plan Note (Signed)
hgba1c acceptable, minimize simple carbs. Increase exercise as tolerated.  

## 2017-10-18 NOTE — Patient Instructions (Signed)

## 2017-10-18 NOTE — Progress Notes (Signed)
Subjective:  I acted as a Education administrator for Dr. Charlett Blake. [name], [place]  Patient ID: Julie Wilkerson, female    DOB: May 31, 1940, 77 y.o.   MRN: [id number]  No chief complaint on file.   HPI  Patient is in today for a 6 week follow up and she continues to struggle with abdominal pain, it can be diffuse but more recently has been in the RLQ and really into right hip/right pelvic region. No fevers or chills. Bowels are moving well and no urinary symptoms ie no dysuria or hematuria. She was ill while she was up in [place] visiting family with sore throat, cough, hoarseness, fevers, chills and malaise, she was seen by an urgent care and was diagnosed with flu and took a course of Tamiflu. She is feeling notably better in this regard. But her right hip area is sore with movements and less so at rest. Also notes increased sleep and dry mouth. Denies CP/palp/SOB/HA/congestion/GI or GU c/o. Taking meds as prescribed  Patient Care Team: [name], MD as PCP - General (Family Medicine) Lake Bells., MD as Consulting Physician (Gastroenterology) Belva Bertin, Armida Sans as Physician Assistant (Family Medicine)   Past Medical History:  Diagnosis Date  . Asthma   . Breast cancer ([place])    2002, left, encapsulated, microcalcifications. lumpectomy, radtiation x 30  . Breast cancer in female [facility])   . Change in mole 06/08/2016  . Colitis   . Decreased visual acuity 01/16/2017  . Dyspnea 11/04/2016  . Dysuria 11/04/2016  . Gluten intolerance   . History of chicken pox   . History of Helicobacter pylori infection 08/01/2015  . Hyperlipidemia   . Hypertension   . Hypothyroid   . IBS (irritable bowel syndrome)   . Left leg pain 03/01/2017  . Low back pain 08/10/2015  . Neck pain 03/01/2017  . Osteoporosis   . RLQ discomfort 03/01/2017  . Stomach cramps   . Urinary tract infection 11/04/2016    Past Surgical History:  Procedure Laterality Date  . ABDOMINAL HYSTERECTOMY     partial  . APPENDECTOMY    .  BREAST LUMPECTOMY Left 2002  . CHOLECYSTECTOMY  Age 62 or 39  . COLONOSCOPY  2017  . FRACTURE SURGERY    . TONSILLECTOMY      Family History  Problem Relation Age of Onset  . Colitis Mother   . Irritable bowel syndrome Mother   . Hypertension Father   . Hyperlipidemia Brother   . Heart attack Brother   . Stomach cancer Paternal Grandmother 62  . Hyperlipidemia Brother   . Heart attack Brother   . Hyperlipidemia Brother   . Heart attack Brother   . Hyperlipidemia Sister   . Leukemia Daughter   . Arthritis Daughter   . Heart disease Daughter        ASD vs VSD  . Colon cancer Neg Hx     Social History   Socioeconomic History  . Marital status: Single    Spouse name: Not on file  . Number of children: Not on file  . Years of education: Not on file  . Highest education level: Not on file  Occupational History  . Occupation: retired    Comment: Pharmacist, hospital, kindergarten  Social Needs  . Financial resource strain: Not on file  . Food insecurity:    Worry: Not on file    Inability: Not on file  . Transportation needs:    Medical: Not on file    Non-medical:  Not on file  Tobacco Use  . Smoking status: Never Smoker  . Smokeless tobacco: Never Used  Substance and Sexual Activity  . Alcohol use: No    Alcohol/week: 0.0 oz  . Drug use: No  . Sexual activity: Never    Comment: lives alone, avoids daiiry and gluten. 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 Physical activity:    Days per week: Not on file    Minutes per session: Not on file  . Stress: Not on file  Relationships  . Social connections:    Talks on phone: Not on file    Gets together: Not on file    Attends religious service: Not on file    Active member of club or organization: Not on file    Attends meetings of clubs or organizations: Not on file    Relationship status: Not on file  . Intimate partner violence:    Fear of current or ex partner: Not on file    Emotionally abused: Not on file     Physically abused: Not on file    Forced sexual activity: Not on file  Other Topics Concern  . Not on file  Social History Narrative  . Not on file    Outpatient Medications Prior to Visit  Medication Sig Dispense Refill  . aspirin 81 MG tablet Take 81 mg by mouth daily.    . beclomethasone (QVAR REDIHALER) 80 MCG/ACT inhaler Inhale 2 puffs into the lungs 2 (two) times daily. 1 Inhaler 0  . benzonatate (TESSALON) 100 MG capsule Take 1 capsule (100 mg total) by mouth 3 (three) times daily as needed for cough. 30 capsule 0  . citalopram (CELEXA) 20 MG tablet Take 1 tablet (20 mg total) by mouth daily. 90 tablet 1  . colestipol (COLESTID) 1 g tablet Take 1 tablet (1 g total) by mouth 2 (two) times daily. 60 tablet 1  . Collagen-Vitamin C (COLLAGEN PLUS VITAMIN C PO) Take 1 tablet by mouth daily.    . fluticasone (FLONASE) 50 MCG/ACT nasal spray Place 2 sprays into both nostrils daily. 16 g 1  . hydrocortisone (ANUSOL-HC) 2.5 % rectal cream Place 1 application rectally 2 (two) times daily. 30 g 0  . hyoscyamine (LEVSIN SL) 0.125 MG SL tablet Place 0.125 mg under the tongue every 4 (four) hours as needed for cramping.    Marland Kitchen levothyroxine (SYNTHROID, LEVOTHROID) 50 MCG tablet TAKE 1 TABLET BY MOUTH ONCE DAILY 90 tablet 1  . lisinopril (PRINIVIL,ZESTRIL) 20 MG tablet Take 1 tablet (20 mg total) by mouth daily. 30 tablet 3  . metoprolol succinate (TOPROL XL) 25 MG 24 hr tablet Take 0.5 tablets (12.5 mg total) by mouth daily. 45 tablet 3  . Multiple Vitamins-Minerals (MULTIVITAMIN ADULT PO) Take 1 tablet by mouth daily.    Marland Kitchen omeprazole (PRILOSEC) 20 MG capsule Take 1 tab by mouth before breakfast and dinner. 60 capsule 3  . Vitamin D, Ergocalciferol, (DRISDOL) 50000 units CAPS capsule Take 1 capsule (50,000 Units total) by mouth every 7 (seven) days. 12 capsule 1  . methylPREDNISolone (MEDROL) 4 MG tablet 5 tab po qd X 1d then 4 tab po qd X 1d then 3 tab po qd X 1d then 2 tab po qd then 1 tab po qd 15  tablet 0  . ondansetron (ZOFRAN ODT) 4 MG disintegrating tablet Take 1 tablet (4 mg total) by mouth every 6 (six) hours as needed for nausea or vomiting. 30 tablet 1  . oseltamivir (TAMIFLU)  75 MG capsule Take 1 capsule (75 mg total) by mouth daily. 7 capsule 0  . promethazine-dextromethorphan (PROMETHAZINE-DM) 6.25-15 MG/5ML syrup Take 5 mLs by mouth 4 (four) times daily as needed. 118 mL 0  . ranitidine (ZANTAC) 150 MG capsule Take 1 capsule (150 mg total) by mouth 2 (two) times daily. 60 capsule 0  . albuterol (PROVENTIL HFA;VENTOLIN HFA) 108 (90 Base) MCG/ACT inhaler Inhale 2 puffs into the lungs every 4 (four) hours as needed for wheezing or shortness of breath.    Marland Kitchen albuterol (PROVENTIL) (2.5 MG/3ML) 0.083% nebulizer solution Take 3 mLs (2.5 mg total) by nebulization every 6 (six) hours as needed for wheezing or shortness of breath. (Patient not taking: Reported on 10/18/2017) 150 mL 1  . AMBULATORY NON FORMULARY MEDICATION Medication Name: FD gard take take 2 times daily before meals. (Patient not taking: Reported on 10/18/2017) 12 capsule 0  . AMBULATORY NON FORMULARY MEDICATION Medication Name: IBGard as needed (Patient not taking: Reported on 10/18/2017) 12 capsule 0   No facility-administered medications prior to visit.     No Known Allergies  Review of Systems  Constitutional: Positive for malaise/fatigue. Negative for fever.  HENT: Positive for sore throat. Negative for congestion and hearing loss.   Eyes: Negative for discharge.  Respiratory: Negative for cough, sputum production and shortness of breath.   Cardiovascular: Negative for chest pain, palpitations and leg swelling.  Gastrointestinal: Positive for abdominal pain. Negative for blood in stool, constipation, diarrhea, heartburn, nausea and vomiting.  Genitourinary: Negative for dysuria, frequency, hematuria and urgency.  Musculoskeletal: Positive for back pain and joint pain. Negative for falls and myalgias.  Skin: Negative  for rash.  Neurological: Negative for dizziness, sensory change, loss of consciousness, weakness and headaches.  Endo/Heme/Allergies: Negative for environmental allergies. Does not bruise/bleed easily.  Psychiatric/Behavioral: Negative for depression and suicidal ideas. The patient is not nervous/anxious and does not have insomnia.        Objective:    Physical Exam  Constitutional: She is oriented to person, place, and time. No distress.  HENT:  Head: Normocephalic and atraumatic.  Eyes: Conjunctivae are normal.  Neck: Neck supple. No thyromegaly present.  Cardiovascular: Normal rate, regular rhythm and normal heart sounds.  No murmur heard. Pulmonary/Chest: Effort normal and breath sounds normal. She has no wheezes.  Abdominal: She exhibits no distension and no mass.  Musculoskeletal: She exhibits no edema.  Lymphadenopathy:    She has no cervical adenopathy.  Neurological: She is alert and oriented to person, place, and time. A cranial nerve deficit is present.  Skin: Skin is warm and dry. No rash noted. She is not diaphoretic.  Psychiatric: Judgment normal.    BP 118/68 (BP Location: Left Arm, Patient Position: Sitting, Cuff Size: Normal)   Pulse (!) 50   Temp 97.8 F (36.6 C) (Oral)   Resp 18   Wt 124 lb 7.2 oz (56.5 kg)   SpO2 97%   BMI 25.14 kg/m  Wt Readings from Last 3 Encounters:  10/18/17 124 lb 7.2 oz (56.5 kg)  08/30/17 127 lb 12.8 oz (58 kg)  08/19/17 127 lb (57.6 kg)   BP Readings from Last 3 Encounters:  10/18/17 118/68  08/30/17 118/60  08/19/17 (!) 116/54     Immunization History  Administered Date(s) Administered  . Influenza, High Dose Seasonal PF 03/11/2015, 02/05/2016, 02/05/2017  . Influenza,inj,quad, With Preservative 05/23/2014  . Pneumococcal Polysaccharide-23 05/23/2014  . Td 02/05/2016  . Zoster 05/24/2009    Health Maintenance  Topic  Date Due  . PNA vac Low Risk Adult (2 of 2 - PCV13) 06/27/2018 (Originally 05/24/2015)  .  INFLUENZA VACCINE  12/22/2017  . COLONOSCOPY  08/04/2025  . TETANUS/TDAP  02/04/2026  . DEXA SCAN  Completed    Lab Results  Component Value Date   WBC 7.3 10/18/2017   HGB 11.2 (L) 10/18/2017   HCT 33.4 (L) 10/18/2017   PLT 266.0 10/18/2017   GLUCOSE 99 10/18/2017   CHOL 236 (H) 03/01/2017   TRIG 116.0 03/01/2017   HDL 72.70 03/01/2017   LDLCALC 140 (H) 03/01/2017   ALT 13 10/18/2017   AST 21 10/18/2017   NA 136 10/18/2017   K 5.1 10/18/2017   CL 102 10/18/2017   CREATININE 1.21 (H) 10/18/2017   BUN 33 (H) 10/18/2017   CO2 25 10/18/2017   TSH 2.20 10/18/2017   HGBA1C 6.0 03/01/2017    Lab Results  Component Value Date   TSH 2.20 10/18/2017   Lab Results  Component Value Date   WBC 7.3 10/18/2017   HGB 11.2 (L) 10/18/2017   HCT 33.4 (L) 10/18/2017   MCV 91.7 10/18/2017   PLT 266.0 10/18/2017   Lab Results  Component Value Date   NA 136 10/18/2017   K 5.1 10/18/2017   CO2 25 10/18/2017   GLUCOSE 99 10/18/2017   BUN 33 (H) 10/18/2017   CREATININE 1.21 (H) 10/18/2017   BILITOT 0.4 10/18/2017   ALKPHOS 51 10/18/2017   AST 21 10/18/2017   ALT 13 10/18/2017   PROT 7.5 10/18/2017   ALBUMIN 4.4 10/18/2017   CALCIUM 9.3 10/18/2017   ANIONGAP 7 07/19/2017   GFR 45.99 (L) 10/18/2017   Lab Results  Component Value Date   CHOL 236 (H) 03/01/2017   Lab Results  Component Value Date   HDL 72.70 03/01/2017   Lab Results  Component Value Date   LDLCALC 140 (H) 03/01/2017   Lab Results  Component Value Date   TRIG 116.0 03/01/2017   Lab Results  Component Value Date   CHOLHDL 3 03/01/2017   Lab Results  Component Value Date   HGBA1C 6.0 03/01/2017         Assessment & Plan:   Problem List Items Addressed This Visit    Cough variant asthma  vs UACS from ACEi    Hyperlipidemia    Encouraged heart healthy diet, increase exercise, avoid trans fats, consider a krill oil cap daily      Essential hypertension - Primary    Well controlled, no  changes to meds. Encouraged heart healthy diet such as the DASH diet and exercise as tolerated.       Relevant Orders   CBC with Differential/Platelet (Completed)   Comprehensive metabolic panel (Completed)   TSH (Completed)   Hypothyroid    Encouraged good sleep hygiene such as dark, quiet room. No blue/green glowing lights such as computer screens in bedroom. No alcohol or stimulants in evening. Cut down on caffeine as able. Regular exercise is helpful but not just prior to bed time.       IBS (irritable bowel syndrome)    Avoid offending foods, take daily fiber and probiotics.       Low back pain    Encouraged moist heat and gentle stretching as tolerated. May try NSAIDs and prescription meds as directed and report if symptoms worsen or seek immediate care.       Relevant Orders   DG HIPS BILAT W OR W/O PELVIS 2V (Completed)  Hyperglycemia    hgba1c acceptable, minimize simple carbs. Increase exercise as tolerated.       Right hip pain    xrays done. Consider orthopaedic referral if persists.       Relevant Orders   DG HIPS BILAT W OR W/O PELVIS 2V (Completed)   Elevated sed rate    Resolved at this time      Relevant Orders   Sedimentation rate (Completed)      I have discontinued Snow Richert's albuterol, promethazine-dextromethorphan, albuterol, methylPREDNISolone, ranitidine, ondansetron, AMBULATORY NON FORMULARY MEDICATION, AMBULATORY NON FORMULARY MEDICATION, and oseltamivir. I am also having her start on alendronate. Additionally, I am having her maintain her Multiple Vitamins-Minerals (MULTIVITAMIN ADULT PO), Collagen-Vitamin C (COLLAGEN PLUS VITAMIN C PO), aspirin, Vitamin D (Ergocalciferol), beclomethasone, citalopram, hyoscyamine, metoprolol succinate, lisinopril, levothyroxine, hydrocortisone, omeprazole, colestipol, fluticasone, and benzonatate.  Meds ordered this encounter  Medications  . alendronate (FOSAMAX) 70 MG tablet    Sig: Take 1 tablet (70 mg  total) by mouth once a week. Take with a full glass of water on an empty stomach.    Dispense:  4 tablet    Refill:  3    CMA served as scribe during this visit. History, Physical and Plan performed by medical provider. Documentation and orders reviewed and attested to.  Penni Homans, MD

## 2017-10-19 DIAGNOSIS — M47812 Spondylosis without myelopathy or radiculopathy, cervical region: Secondary | ICD-10-CM | POA: Diagnosis not present

## 2017-10-19 DIAGNOSIS — M9902 Segmental and somatic dysfunction of thoracic region: Secondary | ICD-10-CM | POA: Diagnosis not present

## 2017-10-19 DIAGNOSIS — M542 Cervicalgia: Secondary | ICD-10-CM | POA: Diagnosis not present

## 2017-10-19 DIAGNOSIS — M9901 Segmental and somatic dysfunction of cervical region: Secondary | ICD-10-CM | POA: Diagnosis not present

## 2017-10-19 LAB — CBC WITH DIFFERENTIAL/PLATELET
BASOS PCT: 1 % (ref 0.0–3.0)
Basophils Absolute: 0.1 10*3/uL (ref 0.0–0.1)
EOS PCT: 0 % (ref 0.0–5.0)
Eosinophils Absolute: 0 10*3/uL (ref 0.0–0.7)
HEMATOCRIT: 33.4 % — AB (ref 36.0–46.0)
HEMOGLOBIN: 11.2 g/dL — AB (ref 12.0–15.0)
Lymphocytes Relative: 32 % (ref 12.0–46.0)
Lymphs Abs: 2.3 10*3/uL (ref 0.7–4.0)
MCHC: 33.4 g/dL (ref 30.0–36.0)
MCV: 91.7 fl (ref 78.0–100.0)
MONO ABS: 0.9 10*3/uL (ref 0.1–1.0)
Monocytes Relative: 11.9 % (ref 3.0–12.0)
Neutro Abs: 4 10*3/uL (ref 1.4–7.7)
Neutrophils Relative %: 55.1 % (ref 43.0–77.0)
Platelets: 266 10*3/uL (ref 150.0–400.0)
RBC: 3.64 Mil/uL — ABNORMAL LOW (ref 3.87–5.11)
RDW: 14.3 % (ref 11.5–15.5)
WBC: 7.3 10*3/uL (ref 4.0–10.5)

## 2017-10-19 LAB — COMPREHENSIVE METABOLIC PANEL
ALBUMIN: 4.4 g/dL (ref 3.5–5.2)
ALT: 13 U/L (ref 0–35)
AST: 21 U/L (ref 0–37)
Alkaline Phosphatase: 51 U/L (ref 39–117)
BUN: 33 mg/dL — ABNORMAL HIGH (ref 6–23)
CALCIUM: 9.3 mg/dL (ref 8.4–10.5)
CHLORIDE: 102 meq/L (ref 96–112)
CO2: 25 mEq/L (ref 19–32)
Creatinine, Ser: 1.21 mg/dL — ABNORMAL HIGH (ref 0.40–1.20)
GFR: 45.99 mL/min — ABNORMAL LOW (ref >60.00)
Glucose, Bld: 99 mg/dL (ref 70–99)
POTASSIUM: 5.1 meq/L (ref 3.5–5.1)
SODIUM: 136 meq/L (ref 135–145)
Total Bilirubin: 0.4 mg/dL (ref 0.2–1.2)
Total Protein: 7.5 g/dL (ref 6.0–8.3)

## 2017-10-19 LAB — SEDIMENTATION RATE: SED RATE: 24 mm/h (ref 0–30)

## 2017-10-19 LAB — TSH: TSH: 2.2 u[IU]/mL (ref 0.35–4.50)

## 2017-10-20 DIAGNOSIS — M9901 Segmental and somatic dysfunction of cervical region: Secondary | ICD-10-CM | POA: Diagnosis not present

## 2017-10-20 DIAGNOSIS — M47812 Spondylosis without myelopathy or radiculopathy, cervical region: Secondary | ICD-10-CM | POA: Diagnosis not present

## 2017-10-20 DIAGNOSIS — M542 Cervicalgia: Secondary | ICD-10-CM | POA: Diagnosis not present

## 2017-10-20 DIAGNOSIS — M9902 Segmental and somatic dysfunction of thoracic region: Secondary | ICD-10-CM | POA: Diagnosis not present

## 2017-10-23 DIAGNOSIS — R700 Elevated erythrocyte sedimentation rate: Secondary | ICD-10-CM

## 2017-10-23 DIAGNOSIS — M25551 Pain in right hip: Secondary | ICD-10-CM

## 2017-10-23 HISTORY — DX: Pain in right hip: M25.551

## 2017-10-23 HISTORY — DX: Elevated erythrocyte sedimentation rate: R70.0

## 2017-10-23 NOTE — Assessment & Plan Note (Signed)
xrays done. Consider orthopaedic referral if persists.

## 2017-10-23 NOTE — Assessment & Plan Note (Signed)
Resolved at this time.  °

## 2017-10-23 NOTE — Assessment & Plan Note (Signed)
Avoid offending foods, take daily fiber and probiotics.

## 2017-10-23 NOTE — Assessment & Plan Note (Signed)
Encouraged moist heat and gentle stretching as tolerated. May try NSAIDs and prescription meds as directed and report if symptoms worsen or seek immediate care 

## 2017-10-23 NOTE — Assessment & Plan Note (Signed)
Encouraged heart healthy diet, increase exercise, avoid trans fats, consider a krill oil cap daily 

## 2017-10-23 NOTE — Assessment & Plan Note (Signed)
Encouraged good sleep hygiene such as dark, quiet room. No blue/green glowing lights such as computer screens in bedroom. No alcohol or stimulants in evening. Cut down on caffeine as able. Regular exercise is helpful but not just prior to bed time.  

## 2017-10-25 DIAGNOSIS — M47812 Spondylosis without myelopathy or radiculopathy, cervical region: Secondary | ICD-10-CM | POA: Diagnosis not present

## 2017-10-25 DIAGNOSIS — M9902 Segmental and somatic dysfunction of thoracic region: Secondary | ICD-10-CM | POA: Diagnosis not present

## 2017-10-25 DIAGNOSIS — M9901 Segmental and somatic dysfunction of cervical region: Secondary | ICD-10-CM | POA: Diagnosis not present

## 2017-10-25 DIAGNOSIS — M542 Cervicalgia: Secondary | ICD-10-CM | POA: Diagnosis not present

## 2017-10-27 DIAGNOSIS — M542 Cervicalgia: Secondary | ICD-10-CM | POA: Diagnosis not present

## 2017-10-27 DIAGNOSIS — M9902 Segmental and somatic dysfunction of thoracic region: Secondary | ICD-10-CM | POA: Diagnosis not present

## 2017-10-27 DIAGNOSIS — M47812 Spondylosis without myelopathy or radiculopathy, cervical region: Secondary | ICD-10-CM | POA: Diagnosis not present

## 2017-10-27 DIAGNOSIS — M9901 Segmental and somatic dysfunction of cervical region: Secondary | ICD-10-CM | POA: Diagnosis not present

## 2017-10-31 ENCOUNTER — Other Ambulatory Visit: Payer: Self-pay | Admitting: Cardiovascular Disease

## 2017-10-31 ENCOUNTER — Other Ambulatory Visit: Payer: Self-pay | Admitting: Family Medicine

## 2017-11-01 DIAGNOSIS — M542 Cervicalgia: Secondary | ICD-10-CM | POA: Diagnosis not present

## 2017-11-01 DIAGNOSIS — M47812 Spondylosis without myelopathy or radiculopathy, cervical region: Secondary | ICD-10-CM | POA: Diagnosis not present

## 2017-11-01 DIAGNOSIS — M9902 Segmental and somatic dysfunction of thoracic region: Secondary | ICD-10-CM | POA: Diagnosis not present

## 2017-11-01 DIAGNOSIS — M9901 Segmental and somatic dysfunction of cervical region: Secondary | ICD-10-CM | POA: Diagnosis not present

## 2017-11-03 ENCOUNTER — Other Ambulatory Visit: Payer: Self-pay | Admitting: Cardiovascular Disease

## 2017-12-19 ENCOUNTER — Ambulatory Visit (INDEPENDENT_AMBULATORY_CARE_PROVIDER_SITE_OTHER): Payer: Medicare HMO | Admitting: Medical

## 2017-12-19 ENCOUNTER — Ambulatory Visit (HOSPITAL_BASED_OUTPATIENT_CLINIC_OR_DEPARTMENT_OTHER)
Admission: RE | Admit: 2017-12-19 | Discharge: 2017-12-19 | Disposition: A | Discharge status: Home / Self Care | Payer: Medicare HMO | Source: Ambulatory Visit | Attending: Medical | Admitting: Medical

## 2017-12-19 ENCOUNTER — Encounter: Payer: Self-pay | Admitting: Medical

## 2017-12-19 VITALS — BP 123/58 | HR 51 | Temp 98.3°F | Resp 16 | Ht 59.0 in | Wt 122.2 lb

## 2017-12-19 DIAGNOSIS — R197 Diarrhea, unspecified: Secondary | ICD-10-CM

## 2017-12-19 DIAGNOSIS — R109 Unspecified abdominal pain: Secondary | ICD-10-CM

## 2017-12-19 DIAGNOSIS — R0781 Pleurodynia: Secondary | ICD-10-CM | POA: Diagnosis not present

## 2017-12-19 DIAGNOSIS — M792 Neuralgia and neuritis, unspecified: Secondary | ICD-10-CM

## 2017-12-19 DIAGNOSIS — Z87440 Personal history of urinary (tract) infections: Secondary | ICD-10-CM | POA: Diagnosis not present

## 2017-12-19 LAB — CBC WITH DIFFERENTIAL/PLATELET
Basophils Absolute: 0 10*3/uL (ref 0.0–0.1)
Basophils Relative: 0.2 % (ref 0.0–3.0)
EOS ABS: 0 10*3/uL (ref 0.0–0.7)
Eosinophils Relative: 0 % (ref 0.0–5.0)
HCT: 33.3 % — ABNORMAL LOW (ref 36.0–46.0)
HEMOGLOBIN: 11.3 g/dL — AB (ref 12.0–15.0)
LYMPHS ABS: 1.9 10*3/uL (ref 0.7–4.0)
Lymphocytes Relative: 35.7 % (ref 12.0–46.0)
MCHC: 34 g/dL (ref 30.0–36.0)
MCV: 89.5 fl (ref 78.0–100.0)
MONO ABS: 0.7 10*3/uL (ref 0.1–1.0)
Monocytes Relative: 12.4 % — ABNORMAL HIGH (ref 3.0–12.0)
Neutro Abs: 2.7 10*3/uL (ref 1.4–7.7)
Neutrophils Relative %: 51.7 % (ref 43.0–77.0)
Platelets: 212 10*3/uL (ref 150.0–400.0)
RBC: 3.72 Mil/uL — AB (ref 3.87–5.11)
RDW: 14.8 % (ref 11.5–15.5)
WBC: 5.3 10*3/uL (ref 4.0–10.5)

## 2017-12-19 LAB — COMPREHENSIVE METABOLIC PANEL
ALBUMIN: 4.1 g/dL (ref 3.5–5.2)
ALT: 14 U/L (ref 0–35)
AST: 21 U/L (ref 0–37)
Alkaline Phosphatase: 44 U/L (ref 39–117)
BUN: 31 mg/dL — AB (ref 6–23)
CHLORIDE: 103 meq/L (ref 96–112)
CO2: 26 mEq/L (ref 19–32)
CREATININE: 1.21 mg/dL — AB (ref 0.40–1.20)
Calcium: 9.4 mg/dL (ref 8.4–10.5)
GFR: 45.97 mL/min — ABNORMAL LOW (ref >60.00)
GLUCOSE: 80 mg/dL (ref 70–99)
Potassium: 4.2 mEq/L (ref 3.5–5.1)
SODIUM: 137 meq/L (ref 135–145)
Total Bilirubin: 0.5 mg/dL (ref 0.2–1.2)
Total Protein: 7.2 g/dL (ref 6.0–8.3)

## 2017-12-19 LAB — POC URINALSYSI DIPSTICK (AUTOMATED)
BILIRUBIN UA: NEGATIVE
Blood, UA: NEGATIVE
Glucose, UA: NEGATIVE
KETONES UA: NEGATIVE
LEUKOCYTES UA: NEGATIVE
NITRITE UA: NEGATIVE
PH UA: 5.5 (ref 5.0–8.0)
PROTEIN UA: NEGATIVE
Spec Grav, UA: 1.02 (ref 1.010–1.025)
Urobilinogen, UA: NEGATIVE E.U./dL — AB

## 2017-12-19 LAB — AMYLASE: AMYLASE: 49 U/L (ref 27–131)

## 2017-12-19 LAB — LIPASE: LIPASE: 29 U/L (ref 11.0–59.0)

## 2017-12-19 MED ORDER — LISINOPRIL 20 MG PO TABS: 20.0000 mg | ORAL_TABLET | Freq: Every day | ORAL | 3 refills | Status: DC

## 2017-12-19 MED ORDER — HYOSCYAMINE SULFATE 0.125 MG SL SUBL: 0.1250 mg | SUBLINGUAL_TABLET | SUBLINGUAL | 2 refills | Status: DC | PRN

## 2017-12-19 MED ORDER — METOPROLOL SUCCINATE ER 25 MG PO TB24: 25.0000 mg | ORAL_TABLET | Freq: Every day | ORAL | 11 refills | Status: DC

## 2017-12-19 MED ORDER — METRONIDAZOLE 500 MG PO TABS: 500.0000 mg | ORAL_TABLET | Freq: Three times a day (TID) | ORAL | 0 refills | Status: DC

## 2017-12-19 MED ORDER — FAMCICLOVIR 500 MG PO TABS: 500.0000 mg | ORAL_TABLET | Freq: Three times a day (TID) | ORAL | 0 refills | Status: DC

## 2017-12-19 MED ORDER — LEVOTHYROXINE SODIUM 50 MCG PO TABS: 50.0000 ug | ORAL_TABLET | Freq: Every day | ORAL | 1 refills | Status: DC

## 2017-12-19 MED ORDER — CITALOPRAM HYDROBROMIDE 20 MG PO TABS: 20.0000 mg | ORAL_TABLET | Freq: Every day | ORAL | 1 refills | Status: DC

## 2017-12-19 NOTE — Progress Notes (Signed)
Subjective:    Patient ID: Julie Wilkerson, female    DOB: Jan 09, 1941, 77 y.o.   MRN: [id number]  HPI     Pt in for follow up.  Pt was in [place]. While in [place] her bp was mild elevated briefly but later her bp improved.   Know her bp back to normal on her normal med regimen  She update that she got UTI. This was 3 weeks ago had uti. She states got antibiotic for 2 day until culture came back showing resistance. She was switched to different antiobotic. She states on 2nd antibiiotic she got diarrhea and since then diarrhea continues. She has gone 5 times today. She state diarrhea is watery. Some mucus presently.  Pt trying to increase her fluid. Pt had to use immodium on tablet. But she request refill request for hycosamine.  Also having some lower rib area pain that burn and hurts intermittently. Radiates towards her back and radiates toward her left back/thoracic area.     Review of Systems  Constitutional: Negative for chills, fatigue and fever.  Cardiovascular: Negative for chest pain and palpitations.  Gastrointestinal: Positive for abdominal pain, diarrhea and nausea. Negative for abdominal distention, rectal pain and vomiting.       Today nausea. But no vomiting. In [place] several days of nausea.  Musculoskeletal: Negative for back pain.  Psychiatric/Behavioral: Negative for behavioral problems, confusion and dysphoric mood. The patient is not hyperactive.     Past Medical History:  Diagnosis Date  . Asthma   . Breast cancer ([place])    2002, left, encapsulated, microcalcifications. lumpectomy, radtiation x 30  . Breast cancer in female [facility])   . Change in mole 06/08/2016  . Colitis   . Decreased visual acuity 01/16/2017  . Dyspnea 11/04/2016  . Dysuria 11/04/2016  . Gluten intolerance   . History of chicken pox   . History of Helicobacter pylori infection 08/01/2015  . Hyperlipidemia   . Hypertension   . Hypothyroid   . IBS (irritable bowel syndrome)   . Left  leg pain 03/01/2017  . Low back pain 08/10/2015  . Neck pain 03/01/2017  . Osteoporosis   . RLQ discomfort 03/01/2017  . Stomach cramps   . Urinary tract infection 11/04/2016     Social History   Socioeconomic History  . Marital status: Single    Spouse name: Not on file  . Number of children: Not on file  . Years of education: Not on file  . Highest education level: Not on file  Occupational History  . Occupation: retired    Comment: Pharmacist, hospital, kindergarten  Social Needs  . Financial resource strain: Not on file  . Food insecurity:    Worry: Not on file    Inability: Not on file  . Transportation needs:    Medical: Not on file    Non-medical: Not on file  Tobacco Use  . Smoking status: Never Smoker  . Smokeless tobacco: Never Used  Substance and Sexual Activity  . Alcohol use: No    Alcohol/week: 0.0 oz  . Drug use: No  . Sexual activity: Never    Comment: lives alone, avoids daiiry and gluten. 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 Physical activity:    Days per week: Not on file    Minutes per session: Not on file  . Stress: Not on file  Relationships  . Social connections:    Talks on phone: Not on file    Gets together: Not on file  Attends religious service: Not on file    Active member of club or organization: Not on file    Attends meetings of clubs or organizations: Not on file    Relationship status: Not on file  . Intimate partner violence:    Fear of current or ex partner: Not on file    Emotionally abused: Not on file    Physically abused: Not on file    Forced sexual activity: Not on file  Other Topics Concern  . Not on file  Social History Narrative  . Not on file    Past Surgical History:  Procedure Laterality Date  . ABDOMINAL HYSTERECTOMY     partial  . APPENDECTOMY    . BREAST LUMPECTOMY Left 2002  . CHOLECYSTECTOMY  Age 43 or 67  . COLONOSCOPY  2017  . FRACTURE SURGERY    . TONSILLECTOMY      Family History  Problem Relation Age  of Onset  . Colitis Mother   . Irritable bowel syndrome Mother   . Hypertension Father   . Hyperlipidemia Brother   . Heart attack Brother   . Stomach cancer Paternal Grandmother 36  . Hyperlipidemia Brother   . Heart attack Brother   . Hyperlipidemia Brother   . Heart attack Brother   . Hyperlipidemia Sister   . Leukemia Daughter   . Arthritis Daughter   . Heart disease Daughter        ASD vs VSD  . Colon cancer Neg Hx     No Known Allergies  Current Outpatient Medications on File Prior to Visit  Medication Sig Dispense Refill  . alendronate (FOSAMAX) 70 MG tablet Take 1 tablet (70 mg total) by mouth once a week. Take with a full glass of water on an empty stomach. 4 tablet 3  . aspirin 81 MG tablet Take 81 mg by mouth daily.    . beclomethasone (QVAR REDIHALER) 80 MCG/ACT inhaler Inhale 2 puffs into the lungs 2 (two) times daily. 1 Inhaler 0  . colestipol (COLESTID) 1 g tablet Take 1 tablet (1 g total) by mouth 2 (two) times daily. 60 tablet 1  . Collagen-Vitamin C (COLLAGEN PLUS VITAMIN C PO) Take 1 tablet by mouth daily.    . hydrocortisone (ANUSOL-HC) 2.5 % rectal cream Place 1 application rectally 2 (two) times daily. 30 g 0  . Multiple Vitamins-Minerals (MULTIVITAMIN ADULT PO) Take 1 tablet by mouth daily.    Marland Kitchen omeprazole (PRILOSEC) 20 MG capsule Take 1 tab by mouth before breakfast and dinner. 60 capsule 3   No current facility-administered medications on file prior to visit.     BP (!) 123/58   Pulse (!) 51   Temp 98.3 F (36.8 C) (Oral)   Resp 16   Ht '4\' 11"'$  (1.499 m)   Wt 122 lb 3.2 oz (55.4 kg)   SpO2 93%   BMI 24.68 kg/m      Objective:   Physical Exam  General Appearance- Not in acute distress.  HEENT Eyes- Scleraeral/Conjuntiva-bilat- Not Yellow. Mouth & Throat- Normal.  Chest and Lung Exam Auscultation: Breath sounds:-Normal. Adventitious sounds:- No Adventitious sounds.  Cardiovascular Auscultation:Rythm - Regular. Heart Sounds -Normal  heart sounds.  Abdomen Inspection:-Inspection Normal.  Palpation/Perucssion: Palpation and Percussion of the abdomen reveal- faint diffuse Tenderness, No Rebound tenderness, No rigidity(Guarding) and No Palpable abdominal masses.  Liver:-Normal.  Spleen:- Normal.   Back- no cva tenderness  Skin- no rash on inspection of luq. Or on inspectionlower rib area or area  underneath her breast. No back rash as well.     Assessment & Plan:  You do appear to have recent diarrhea that occurred while on second antibiotic for UTI.  This causes concern for possible C. difficile infection.  I have placed a stool panel studies to be done.  Please go ahead and turn in the kit today so we can check for bacteria, ova&p, and C. Difficile.  Please make sure you are well-hydrated, eat bland foods and can use Imodium for diarrhea.  Also can use your hycosamine.  Prescription of Flagyl written today.  Please go ahead and turn in all stool panel studies and then start Flagyl.  We will get CBC, CMP, amylase and lipase to be cautious.  On review of your chart I do remember you had abdominal pain episodes that required extensive work-up in emergency department and in our office.  So if your pain worsens might consider reimaging abdomen and pelvis.  Some recent left rib pain weeks ago while in [place].  Pain is still occurring intermittently and has some potential features of neuropathic type pain.  Considering possible early shingles.  No rash seen presently but making Famvir print prescription available if you see any rash.  Also decided to go ahead and get last rib/chest x-ray to evaluate lower portion of the lung in that region.   Since he did report UTI and [place] we will go ahead and repeat your urine and get culture to make sure no persisting infection present.   Follow-up in 7 days or as needed.  Mackie Pai, PA-C

## 2017-12-19 NOTE — Patient Instructions (Signed)
You do appear to have recent diarrhea that occurred while on second antibiotic for UTI.  This causes concern for possible C. difficile infection.  I have placed a stool panel studies to be done.  Please go ahead and turn in the kit today so we can check for bacteria, ova&p, and C. Difficile.  Please make sure you are well-hydrated, eat bland foods and can use Imodium for diarrhea.  Also can use your hycosamine.  Prescription of Flagyl written today.  Please go ahead and turn in all stool panel studies and then start Flagyl.  We will get CBC, CMP, amylase and lipase to be cautious.  On review of your chart I do remember you had abdominal pain episodes that required extensive work-up in emergency department and in our office.  So if your pain worsens might consider reimaging abdomen and pelvis.  Some recent left rib pain weeks ago while in [place].  Pain is still occurring intermittently and has some potential features of neuropathic type pain.  Considering possible early shingles.  No rash seen presently but making Famvir print prescription available if you see any rash.  Also decided to go ahead and get last rib/chest x-ray to evaluate lower portion of the lung in that region.   Since he did report UTI and [place] we will go ahead and repeat your urine and get culture to make sure no persisting infection present.  Follow-up in 7 days or as needed.

## 2017-12-20 ENCOUNTER — Other Ambulatory Visit: Payer: Medicare HMO

## 2017-12-20 DIAGNOSIS — M47812 Spondylosis without myelopathy or radiculopathy, cervical region: Secondary | ICD-10-CM | POA: Diagnosis not present

## 2017-12-20 DIAGNOSIS — M542 Cervicalgia: Secondary | ICD-10-CM | POA: Diagnosis not present

## 2017-12-20 DIAGNOSIS — M9902 Segmental and somatic dysfunction of thoracic region: Secondary | ICD-10-CM | POA: Diagnosis not present

## 2017-12-20 DIAGNOSIS — R197 Diarrhea, unspecified: Secondary | ICD-10-CM | POA: Diagnosis not present

## 2017-12-20 DIAGNOSIS — R109 Unspecified abdominal pain: Secondary | ICD-10-CM | POA: Diagnosis not present

## 2017-12-20 DIAGNOSIS — M9901 Segmental and somatic dysfunction of cervical region: Secondary | ICD-10-CM | POA: Diagnosis not present

## 2017-12-20 LAB — URINE CULTURE
MICRO NUMBER:: 90893090
SPECIMEN QUALITY: ADEQUATE

## 2017-12-22 ENCOUNTER — Ambulatory Visit (INDEPENDENT_AMBULATORY_CARE_PROVIDER_SITE_OTHER): Payer: Medicare HMO | Admitting: Family Medicine

## 2017-12-22 VITALS — BP 118/60 | HR 51 | Temp 97.1°F | Resp 18 | Wt 123.8 lb

## 2017-12-22 DIAGNOSIS — I10 Essential (primary) hypertension: Secondary | ICD-10-CM | POA: Diagnosis not present

## 2017-12-22 DIAGNOSIS — E559 Vitamin D deficiency, unspecified: Secondary | ICD-10-CM

## 2017-12-22 DIAGNOSIS — R109 Unspecified abdominal pain: Secondary | ICD-10-CM

## 2017-12-22 DIAGNOSIS — E782 Mixed hyperlipidemia: Secondary | ICD-10-CM | POA: Diagnosis not present

## 2017-12-22 DIAGNOSIS — E039 Hypothyroidism, unspecified: Secondary | ICD-10-CM | POA: Diagnosis not present

## 2017-12-22 DIAGNOSIS — R700 Elevated erythrocyte sedimentation rate: Secondary | ICD-10-CM | POA: Diagnosis not present

## 2017-12-22 DIAGNOSIS — R739 Hyperglycemia, unspecified: Secondary | ICD-10-CM

## 2017-12-22 DIAGNOSIS — R1012 Left upper quadrant pain: Secondary | ICD-10-CM

## 2017-12-22 DIAGNOSIS — R197 Diarrhea, unspecified: Secondary | ICD-10-CM | POA: Diagnosis not present

## 2017-12-22 DIAGNOSIS — R350 Frequency of micturition: Secondary | ICD-10-CM

## 2017-12-22 HISTORY — DX: Frequency of micturition: R35.0

## 2017-12-22 MED ORDER — TRAMADOL HCL 50 MG PO TABS: 50.0000 mg | ORAL_TABLET | Freq: Two times a day (BID) | ORAL | 0 refills | Status: AC | PRN

## 2017-12-22 NOTE — Assessment & Plan Note (Signed)
hgba1c acceptable, minimize simple carbs. Increase exercise as tolerated.  

## 2017-12-22 NOTE — Assessment & Plan Note (Addendum)
Diffuse but most notable in left flank and suprapubic region. Also in RUQ check labs and CT scan. CT scan shows distended LUQ and constipation. Has had some trouble with diarrhea as well. Is now on Flagyl and will try and get the constipation cleared out. Maintain a bland diet with good fluid intake and we will get her back to gastroenteology for futher consideation. Spent 25 minutes with patient

## 2017-12-22 NOTE — Patient Instructions (Signed)
Diarrhea, Adult °Diarrhea is when you have loose and water poop (stool) often. Diarrhea can make you feel weak and cause you to get dehydrated. Dehydration can make you tired and thirsty, make you have a dry mouth, and make it so you pee (urinate) less often. Diarrhea often lasts 2-3 days. However, it can last longer if it is a sign of something more serious. It is important to treat your diarrhea as told by your doctor. °Follow these instructions at home: °Eating and drinking ° °Follow these recommendations as told by your doctor: °· Take an oral rehydration solution (ORS). This is a drink that is sold at pharmacies and stores. °· Drink clear fluids, such as: °? Water. °? Ice chips. °? Diluted fruit juice. °? Low-calorie sports drinks. °· Eat bland, easy-to-digest foods in small amounts as you are able. These foods include: °? Bananas. °? Applesauce. °? Rice. °? Low-fat (lean) meats. °? Toast. °? Crackers. °· Avoid drinking fluids that have a lot of sugar or caffeine in them. °· Avoid alcohol. °· Avoid spicy or fatty foods. ° °General instructions ° °· Drink enough fluid to keep your pee (urine) clear or pale yellow. °· Wash your hands often. If you cannot use soap and water, use hand sanitizer. °· Make sure that all people in your home wash their hands well and often. °· Take over-the-counter and prescription medicines only as told by your doctor. °· Rest at home while you get better. °· Watch your condition for any changes. °· Take a warm bath to help with any burning or pain from having diarrhea. °· Keep all follow-up visits as told by your doctor. This is important. °Contact a doctor if: °· You have a fever. °· Your diarrhea gets worse. °· You have new symptoms. °· You cannot keep fluids down. °· You feel light-headed or dizzy. °· You have a headache. °· You have muscle cramps. °Get help right away if: °· You have chest pain. °· You feel very weak or you pass out (faint). °· You have bloody or black poop or  poop that look like tar. °· You have very bad pain, cramping, or bloating in your belly (abdomen). °· You have trouble breathing or you are breathing very quickly. °· Your heart is beating very quickly. °· Your skin feels cold and clammy. °· You feel confused. °· You have signs of dehydration, such as: °? Dark pee, hardly any pee, or no pee. °? Cracked lips. °? Dry mouth. °? Sunken eyes. °? Sleepiness. °? Weakness. °This information is not intended to replace advice given to you by your health care provider. Make sure you discuss any questions you have with your health care provider. °Document Released: 10/27/2007 Document Revised: 11/28/2015 Document Reviewed: 01/14/2015 °Elsevier Interactive Patient Education © 2018 [employer] ° °

## 2017-12-22 NOTE — Assessment & Plan Note (Signed)
Encouraged heart healthy diet, increase exercise, avoid trans fats, consider a krill oil cap daily 

## 2017-12-22 NOTE — Assessment & Plan Note (Signed)
On Levothyroxine, continue to monitor 

## 2017-12-22 NOTE — Assessment & Plan Note (Signed)
Check level 

## 2017-12-22 NOTE — Assessment & Plan Note (Signed)
Check lab today

## 2017-12-22 NOTE — Assessment & Plan Note (Signed)
Urinalysis and urine culture.

## 2017-12-23 ENCOUNTER — Ambulatory Visit (HOSPITAL_BASED_OUTPATIENT_CLINIC_OR_DEPARTMENT_OTHER)
Admission: RE | Admit: 2017-12-23 | Discharge: 2017-12-23 | Disposition: A | Discharge status: Home / Self Care | Payer: Medicare HMO | Source: Ambulatory Visit | Attending: Family Medicine | Admitting: Family Medicine

## 2017-12-23 DIAGNOSIS — R700 Elevated erythrocyte sedimentation rate: Secondary | ICD-10-CM

## 2017-12-23 DIAGNOSIS — R197 Diarrhea, unspecified: Secondary | ICD-10-CM | POA: Diagnosis not present

## 2017-12-23 DIAGNOSIS — K5730 Diverticulosis of large intestine without perforation or abscess without bleeding: Secondary | ICD-10-CM | POA: Insufficient documentation

## 2017-12-23 DIAGNOSIS — R1012 Left upper quadrant pain: Secondary | ICD-10-CM | POA: Diagnosis not present

## 2017-12-23 DIAGNOSIS — K3189 Other diseases of stomach and duodenum: Secondary | ICD-10-CM | POA: Diagnosis not present

## 2017-12-23 DIAGNOSIS — K449 Diaphragmatic hernia without obstruction or gangrene: Secondary | ICD-10-CM | POA: Diagnosis not present

## 2017-12-23 DIAGNOSIS — R109 Unspecified abdominal pain: Secondary | ICD-10-CM | POA: Diagnosis not present

## 2017-12-23 LAB — COMPREHENSIVE METABOLIC PANEL
ALK PHOS: 48 U/L (ref 39–117)
ALT: 13 U/L (ref 0–35)
AST: 20 U/L (ref 0–37)
Albumin: 4.2 g/dL (ref 3.5–5.2)
BUN: 35 mg/dL — ABNORMAL HIGH (ref 6–23)
CALCIUM: 9.4 mg/dL (ref 8.4–10.5)
CO2: 26 mEq/L (ref 19–32)
Chloride: 101 mEq/L (ref 96–112)
Creatinine, Ser: 1.23 mg/dL — ABNORMAL HIGH (ref 0.40–1.20)
GFR: 45.11 mL/min — AB (ref >60.00)
GLUCOSE: 94 mg/dL (ref 70–99)
POTASSIUM: 4.3 meq/L (ref 3.5–5.1)
SODIUM: 136 meq/L (ref 135–145)
TOTAL PROTEIN: 7 g/dL (ref 6.0–8.3)
Total Bilirubin: 0.4 mg/dL (ref 0.2–1.2)

## 2017-12-23 LAB — CBC WITH DIFFERENTIAL/PLATELET
BASOS PCT: 0.5 % (ref 0.0–3.0)
Basophils Absolute: 0 10*3/uL (ref 0.0–0.1)
EOS PCT: 0 % (ref 0.0–5.0)
Eosinophils Absolute: 0 10*3/uL (ref 0.0–0.7)
HCT: 33.1 % — ABNORMAL LOW (ref 36.0–46.0)
HEMOGLOBIN: 11 g/dL — AB (ref 12.0–15.0)
Lymphocytes Relative: 35.8 % (ref 12.0–46.0)
Lymphs Abs: 2.2 10*3/uL (ref 0.7–4.0)
MCHC: 33.2 g/dL (ref 30.0–36.0)
MCV: 91 fl (ref 78.0–100.0)
MONO ABS: 0.8 10*3/uL (ref 0.1–1.0)
Monocytes Relative: 13.7 % — ABNORMAL HIGH (ref 3.0–12.0)
Neutro Abs: 3 10*3/uL (ref 1.4–7.7)
Neutrophils Relative %: 50 % (ref 43.0–77.0)
PLATELETS: 203 10*3/uL (ref 150.0–400.0)
RBC: 3.64 Mil/uL — ABNORMAL LOW (ref 3.87–5.11)
RDW: 14.7 % (ref 11.5–15.5)
WBC: 6 10*3/uL (ref 4.0–10.5)

## 2017-12-23 LAB — URINALYSIS
BILIRUBIN URINE: NEGATIVE
HGB URINE DIPSTICK: NEGATIVE
Ketones, ur: NEGATIVE
Leukocytes, UA: NEGATIVE
Nitrite: NEGATIVE
Specific Gravity, Urine: 1.02 (ref 1.000–1.030)
TOTAL PROTEIN, URINE-UPE24: NEGATIVE
URINE GLUCOSE: NEGATIVE
Urobilinogen, UA: 0.2 (ref 0.0–1.0)
pH: 5 (ref 5.0–8.0)

## 2017-12-23 LAB — GASTROINTESTINAL PATHOGEN PANEL PCR
C. difficile Tox A/B, PCR: NOT DETECTED
Campylobacter, PCR: NOT DETECTED
Cryptosporidium, PCR: NOT DETECTED
E COLI (ETEC) LT/ST, PCR: NOT DETECTED
E COLI (STEC) STX1/STX2, PCR: NOT DETECTED
E coli 0157, PCR: NOT DETECTED
GIARDIA LAMBLIA, PCR: NOT DETECTED
NOROVIRUS, PCR: NOT DETECTED
Rotavirus A, PCR: NOT DETECTED
SHIGELLA, PCR: NOT DETECTED
Salmonella, PCR: NOT DETECTED

## 2017-12-23 LAB — OVA AND PARASITE EXAMINATION
CONCENTRATE RESULT:: NONE SEEN
SPECIMEN QUALITY:: ADEQUATE
TRICHROME RESULT:: NONE SEEN
VKL: 90899283

## 2017-12-23 LAB — URINE CULTURE
MICRO NUMBER:: 90910977
SPECIMEN QUALITY: ADEQUATE

## 2017-12-23 LAB — LIPID PANEL
CHOLESTEROL: 242 mg/dL — AB (ref 0–200)
HDL: 70 mg/dL (ref >39.00)
LDL Cholesterol: 149 mg/dL — ABNORMAL HIGH (ref 0–99)
NONHDL: 172.25
TRIGLYCERIDES: 114 mg/dL (ref 0.0–149.0)
Total CHOL/HDL Ratio: 3
VLDL: 22.8 mg/dL (ref 0.0–40.0)

## 2017-12-23 LAB — SEDIMENTATION RATE: SED RATE: 33 mm/h — AB (ref 0–30)

## 2017-12-23 LAB — HEMOGLOBIN A1C: Hgb A1c MFr Bld: 6 % (ref 4.6–6.5)

## 2017-12-23 LAB — VITAMIN D 25 HYDROXY (VIT D DEFICIENCY, FRACTURES): VITD: 80.4 ng/mL (ref 30.00–100.00)

## 2017-12-23 LAB — TSH: TSH: 1.31 u[IU]/mL (ref 0.35–4.50)

## 2017-12-25 NOTE — Assessment & Plan Note (Signed)
Well controlled, no changes to meds. Encouraged heart healthy diet such as the DASH diet and exercise as tolerated.  °

## 2017-12-25 NOTE — Progress Notes (Signed)
Subjective:    Patient ID: Julie Wilkerson, female    DOB: 1940-08-03, 77 y.o.   MRN: [id number]  No chief complaint on file.   HPI Patient is in today for follow-up on abdominal pain.  She continues to struggle with diffuse abdominal pain although recently has worsened in the left upper quadrant.  She has been started on Flagyl with no significant change in her diarrhea thus far.  Her C. difficile was negative.  She is struggling with malaise, myalgias, anorexia, nausea, headaches and persistent fatigue.  She was on vacation in [place] when she developed urinary retention and was found to have a UTI.  Was placed on an antibiotic and her urinary symptoms have resolved. Denies CP/palp/SOB/HA/congestion/fevers. Taking meds as prescribed  Past Medical History:  Diagnosis Date  . Asthma   . Breast cancer ([place])    2002, left, encapsulated, microcalcifications. lumpectomy, radtiation x 30  . Breast cancer in female [facility])   . Change in mole 06/08/2016  . Colitis   . Decreased visual acuity 01/16/2017  . Dyspnea 11/04/2016  . Dysuria 11/04/2016  . Gluten intolerance   . History of chicken pox   . History of Helicobacter pylori infection 08/01/2015  . Hyperlipidemia   . Hypertension   . Hypothyroid   . IBS (irritable bowel syndrome)   . Left leg pain 03/01/2017  . Low back pain 08/10/2015  . Neck pain 03/01/2017  . Osteoporosis   . RLQ discomfort 03/01/2017  . Stomach cramps   . Urinary tract infection 11/04/2016    Past Surgical History:  Procedure Laterality Date  . ABDOMINAL HYSTERECTOMY     partial  . APPENDECTOMY    . BREAST LUMPECTOMY Left 2002  . CHOLECYSTECTOMY  Age 74 or 46  . COLONOSCOPY  2017  . FRACTURE SURGERY    . TONSILLECTOMY      Family History  Problem Relation Age of Onset  . Colitis Mother   . Irritable bowel syndrome Mother   . Hypertension Father   . Hyperlipidemia Brother   . Heart attack Brother   . Stomach cancer Paternal Grandmother 41  .  Hyperlipidemia Brother   . Heart attack Brother   . Hyperlipidemia Brother   . Heart attack Brother   . Hyperlipidemia Sister   . Leukemia Daughter   . Arthritis Daughter   . Heart disease Daughter        ASD vs VSD  . Colon cancer Neg Hx     Social History   Socioeconomic History  . Marital status: Single    Spouse name: Not on file  . Number of children: Not on file  . Years of education: Not on file  . Highest education level: Not on file  Occupational History  . Occupation: retired    Comment: Pharmacist, hospital, kindergarten  Social Needs  . Financial resource strain: Not on file  . Food insecurity:    Worry: Not on file    Inability: Not on file  . Transportation needs:    Medical: Not on file    Non-medical: Not on file  Tobacco Use  . Smoking status: Never Smoker  . Smokeless tobacco: Never Used  Substance and Sexual Activity  . Alcohol use: No    Alcohol/week: 0.0 oz  . Drug use: No  . Sexual activity: Never    Comment: lives alone, avoids daiiry and gluten. 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 Physical activity:    Days per week: Not on  file    Minutes per session: Not on file  . Stress: Not on file  Relationships  . Social connections:    Talks on phone: Not on file    Gets together: Not on file    Attends religious service: Not on file    Active member of club or organization: Not on file    Attends meetings of clubs or organizations: Not on file    Relationship status: Not on file  . Intimate partner violence:    Fear of current or ex partner: Not on file    Emotionally abused: Not on file    Physically abused: Not on file    Forced sexual activity: Not on file  Other Topics Concern  . Not on file  Social History Narrative  . Not on file    Outpatient Medications Prior to Visit  Medication Sig Dispense Refill  . alendronate (FOSAMAX) 70 MG tablet Take 1 tablet (70 mg total) by mouth once a week. Take with a full glass of water on an empty stomach.  4 tablet 3  . aspirin 81 MG tablet Take 81 mg by mouth daily.    . beclomethasone (QVAR REDIHALER) 80 MCG/ACT inhaler Inhale 2 puffs into the lungs 2 (two) times daily. 1 Inhaler 0  . citalopram (CELEXA) 20 MG tablet Take 1 tablet (20 mg total) by mouth daily. 90 tablet 1  . colestipol (COLESTID) 1 g tablet Take 1 tablet (1 g total) by mouth 2 (two) times daily. 60 tablet 1  . Collagen-Vitamin C (COLLAGEN PLUS VITAMIN C PO) Take 1 tablet by mouth daily.    . famciclovir (FAMVIR) 500 MG tablet Take 1 tablet (500 mg total) by mouth 3 (three) times daily. 21 tablet 0  . hydrocortisone (ANUSOL-HC) 2.5 % rectal cream Place 1 application rectally 2 (two) times daily. 30 g 0  . hyoscyamine (LEVSIN SL) 0.125 MG SL tablet Place 1 tablet (0.125 mg total) under the tongue every 4 (four) hours as needed for cramping. 30 tablet 2  . levothyroxine (SYNTHROID, LEVOTHROID) 50 MCG tablet Take 1 tablet (50 mcg total) by mouth daily. 90 tablet 1  . lisinopril (PRINIVIL,ZESTRIL) 20 MG tablet Take 1 tablet (20 mg total) by mouth daily. 30 tablet 3  . metoprolol succinate (TOPROL-XL) 25 MG 24 hr tablet Take 1 tablet (25 mg total) by mouth daily. 30 tablet 11  . metroNIDAZOLE (FLAGYL) 500 MG tablet Take 1 tablet (500 mg total) by mouth 3 (three) times daily. 21 tablet 0  . Multiple Vitamins-Minerals (MULTIVITAMIN ADULT PO) Take 1 tablet by mouth daily.    Marland Kitchen omeprazole (PRILOSEC) 20 MG capsule Take 1 tab by mouth before breakfast and dinner. 60 capsule 3   No facility-administered medications prior to visit.     No Known Allergies  Review of Systems  Constitutional: Positive for chills and malaise/fatigue. Negative for fever.  HENT: Negative for congestion.   Eyes: Negative for blurred vision.  Respiratory: Negative for shortness of breath.   Cardiovascular: Negative for chest pain, palpitations and leg swelling.  Gastrointestinal: Positive for abdominal pain, diarrhea and nausea. Negative for blood in stool and  melena.  Genitourinary: Negative for dysuria and frequency.  Musculoskeletal: Positive for myalgias. Negative for falls.  Skin: Negative for rash.  Neurological: Positive for headaches. Negative for dizziness and loss of consciousness.  Endo/Heme/Allergies: Negative for environmental allergies.  Psychiatric/Behavioral: Negative for depression. The patient is not nervous/anxious.        Objective:  Physical Exam  Constitutional: She is oriented to person, place, and time. She appears well-developed and well-nourished. No distress.  HENT:  Head: Normocephalic and atraumatic.  Nose: Nose normal.  Eyes: Right eye exhibits no discharge. Left eye exhibits no discharge.  Neck: Normal range of motion. Neck supple.  Cardiovascular: Normal rate and regular rhythm.  No murmur heard. Pulmonary/Chest: Effort normal and breath sounds normal.  Abdominal: Soft. Bowel sounds are normal. She exhibits no distension and no mass. There is tenderness. There is no rebound and no guarding. No hernia.  Musculoskeletal: She exhibits no edema.  Neurological: She is alert and oriented to person, place, and time.  Skin: Skin is warm and dry.  Psychiatric: She has a normal mood and affect.  Nursing note and vitals reviewed.   BP 118/60 (BP Location: Left Arm, Patient Position: Sitting, Cuff Size: Normal)   Pulse (!) 51   Temp (!) 97.1 F (36.2 C) (Oral)   Resp 18   Wt 123 lb 12.8 oz (56.2 kg)   SpO2 97%   BMI 25.00 kg/m  Wt Readings from Last 3 Encounters:  12/22/17 123 lb 12.8 oz (56.2 kg)  12/19/17 122 lb 3.2 oz (55.4 kg)  10/18/17 124 lb 7.2 oz (56.5 kg)     Lab Results  Component Value Date   WBC 6.0 12/22/2017   HGB 11.0 (L) 12/22/2017   HCT 33.1 (L) 12/22/2017   PLT 203.0 12/22/2017   GLUCOSE 94 12/22/2017   CHOL 242 (H) 12/22/2017   TRIG 114.0 12/22/2017   HDL 70.00 12/22/2017   LDLCALC 149 (H) 12/22/2017   ALT 13 12/22/2017   AST 20 12/22/2017   NA 136 12/22/2017   K 4.3  12/22/2017   CL 101 12/22/2017   CREATININE 1.23 (H) 12/22/2017   BUN 35 (H) 12/22/2017   CO2 26 12/22/2017   TSH 1.31 12/22/2017   HGBA1C 6.0 12/22/2017    Lab Results  Component Value Date   TSH 1.31 12/22/2017   Lab Results  Component Value Date   WBC 6.0 12/22/2017   HGB 11.0 (L) 12/22/2017   HCT 33.1 (L) 12/22/2017   MCV 91.0 12/22/2017   PLT 203.0 12/22/2017   Lab Results  Component Value Date   NA 136 12/22/2017   K 4.3 12/22/2017   CO2 26 12/22/2017   GLUCOSE 94 12/22/2017   BUN 35 (H) 12/22/2017   CREATININE 1.23 (H) 12/22/2017   BILITOT 0.4 12/22/2017   ALKPHOS 48 12/22/2017   AST 20 12/22/2017   ALT 13 12/22/2017   PROT 7.0 12/22/2017   ALBUMIN 4.2 12/22/2017   CALCIUM 9.4 12/22/2017   ANIONGAP 7 07/19/2017   GFR 45.11 (L) 12/22/2017   Lab Results  Component Value Date   CHOL 242 (H) 12/22/2017   Lab Results  Component Value Date   HDL 70.00 12/22/2017   Lab Results  Component Value Date   LDLCALC 149 (H) 12/22/2017   Lab Results  Component Value Date   TRIG 114.0 12/22/2017   Lab Results  Component Value Date   CHOLHDL 3 12/22/2017   Lab Results  Component Value Date   HGBA1C 6.0 12/22/2017       Assessment & Plan:   Problem List Items Addressed This Visit    Hyperlipidemia    Encouraged heart healthy diet, increase exercise, avoid trans fats, consider a krill oil cap daily      Relevant Orders   Lipid panel (Completed)   Essential hypertension  Well controlled, no changes to meds. Encouraged heart healthy diet such as the DASH diet and exercise as tolerated.       Hypothyroid    On Levothyroxine, continue to monitor      Relevant Orders   TSH (Completed)   Vitamin D deficiency    Check level      Relevant Orders   VITAMIN D 25 Hydroxy (Vit-D Deficiency, Fractures) (Completed)   Urinalysis (Completed)   Hyperglycemia    hgba1c acceptable, minimize simple carbs. Increase exercise as tolerated.       Relevant  Orders   Hemoglobin A1c (Completed)   Comprehensive metabolic panel (Completed)   Abdominal pain    Diffuse but most notable in left flank and suprapubic region. Also in RUQ check labs and CT scan. CT scan shows distended LUQ and constipation. Has had some trouble with diarrhea as well. Is now on Flagyl and will try and get the constipation cleared out. Maintain a bland diet with good fluid intake and we will get her back to gastroenteology for futher consideation. Spent 25 minutes with patient      Relevant Medications   traMADol (ULTRAM) 50 MG tablet   Other Relevant Orders   CT RENAL STONE STUDY (Completed)   CBC with Differential/Platelet (Completed)   Comprehensive metabolic panel (Completed)   CT RENAL STONE STUDY (Completed)   Elevated sed rate    Check lab today      Relevant Orders   Sedimentation rate (Completed)   CT RENAL STONE STUDY (Completed)   Urinary frequency    Urinalysis and urine culture      Relevant Orders   Urine Culture (Completed)   Urinalysis (Completed)    Other Visit Diagnoses    Diarrhea, unspecified type    -  Primary   Relevant Orders   Stool, WBC/Lactoferrin      I am having Jamison Neighbor start on traMADol. I am also having her maintain her Multiple Vitamins-Minerals (MULTIVITAMIN ADULT PO), Collagen-Vitamin C (COLLAGEN PLUS VITAMIN C PO), aspirin, beclomethasone, hydrocortisone, omeprazole, colestipol, alendronate, citalopram, hyoscyamine, levothyroxine, metoprolol succinate, lisinopril, metroNIDAZOLE, and famciclovir.  Meds ordered this encounter  Medications  . traMADol (ULTRAM) 50 MG tablet    Sig: Take 1 tablet (50 mg total) by mouth every 12 (twelve) hours as needed for up to 5 days for moderate pain or severe pain.    Dispense:  10 tablet    Refill:  0     Penni Homans, MD

## 2017-12-26 ENCOUNTER — Telehealth: Payer: Self-pay

## 2017-12-26 ENCOUNTER — Ambulatory Visit: Payer: Self-pay | Admitting: Family Medicine

## 2017-12-26 LAB — FECAL LACTOFERRIN, QUANT
Fecal Lactoferrin: NEGATIVE
MICRO NUMBER: 90916117
SPECIMEN QUALITY: ADEQUATE

## 2017-12-26 NOTE — Telephone Encounter (Signed)
-----   Message from Yetta Flock, MD sent at 12/24/2017  4:25 PM EDT ----- Regarding: patient follow up Almyra Free would you mind touching base with this patient on Monday and seeing how she is doing?  If she's not doing well, may be good to get in with an APP in the near future as I don't think I have anything open this week or next. If I have any cancellations this week we should put her in any open slot. Thanks  ----- Message ----- From: [name], MD Sent: 12/24/2017   8:27 AM To: Yetta Flock, MD  Braulio Bosch saw this nice lady for me back in March unfortunately now she has increasing LUQ pain and some diarrhea. Her CT scan just showed stomach distension and constipation. I will have her try to clean out her constipation but I assume I should get her back to your office also. Advise if you think that is unnecessary and in the meantime I will have them call Monday for an appointment.  Thanks  [employer]

## 2017-12-26 NOTE — Telephone Encounter (Signed)
Spoke to patient and have scheduled her to see an APP next week.

## 2017-12-29 DIAGNOSIS — M542 Cervicalgia: Secondary | ICD-10-CM | POA: Diagnosis not present

## 2017-12-29 DIAGNOSIS — M9901 Segmental and somatic dysfunction of cervical region: Secondary | ICD-10-CM | POA: Diagnosis not present

## 2017-12-29 DIAGNOSIS — M9902 Segmental and somatic dysfunction of thoracic region: Secondary | ICD-10-CM | POA: Diagnosis not present

## 2017-12-29 DIAGNOSIS — M47812 Spondylosis without myelopathy or radiculopathy, cervical region: Secondary | ICD-10-CM | POA: Diagnosis not present

## 2017-12-30 ENCOUNTER — Telehealth: Payer: Self-pay | Admitting: Family Medicine

## 2017-12-30 NOTE — Telephone Encounter (Signed)
Copied from [place] [id number]. Topic: Quick Communication - See Telephone Encounter >> Dec 30, 2017 11:41 AM Synthia Innocent wrote: CRM for notification. See Telephone encounter for: 12/30/17. Requesting a UA, burning while urinating, did have some bleeding(that stopped 2 days ago). Offer appt at another location, declined. Please advise

## 2017-12-31 ENCOUNTER — Ambulatory Visit: Payer: Self-pay | Admitting: Family Medicine

## 2017-12-31 DIAGNOSIS — E782 Mixed hyperlipidemia: Secondary | ICD-10-CM | POA: Diagnosis not present

## 2017-12-31 DIAGNOSIS — R300 Dysuria: Secondary | ICD-10-CM | POA: Diagnosis not present

## 2017-12-31 DIAGNOSIS — Z0130 Encounter for examination of blood pressure without abnormal findings: Secondary | ICD-10-CM | POA: Diagnosis not present

## 2017-12-31 DIAGNOSIS — N3001 Acute cystitis with hematuria: Secondary | ICD-10-CM | POA: Diagnosis not present

## 2017-12-31 DIAGNOSIS — Z0289 Encounter for other administrative examinations: Secondary | ICD-10-CM

## 2018-01-01 ENCOUNTER — Emergency Department (HOSPITAL_BASED_OUTPATIENT_CLINIC_OR_DEPARTMENT_OTHER)
Admission: EM | Admit: 2018-01-01 | Discharge: 2018-01-01 | Disposition: A | Discharge status: Home / Self Care | Payer: Medicare HMO | Attending: Emergency Medicine | Admitting: Emergency Medicine

## 2018-01-01 ENCOUNTER — Other Ambulatory Visit: Payer: Self-pay

## 2018-01-01 ENCOUNTER — Encounter (HOSPITAL_BASED_OUTPATIENT_CLINIC_OR_DEPARTMENT_OTHER): Payer: Self-pay | Admitting: Emergency Medicine

## 2018-01-01 DIAGNOSIS — M549 Dorsalgia, unspecified: Secondary | ICD-10-CM | POA: Diagnosis not present

## 2018-01-01 DIAGNOSIS — R300 Dysuria: Secondary | ICD-10-CM | POA: Diagnosis present

## 2018-01-01 DIAGNOSIS — J45909 Unspecified asthma, uncomplicated: Secondary | ICD-10-CM | POA: Insufficient documentation

## 2018-01-01 DIAGNOSIS — Z7982 Long term (current) use of aspirin: Secondary | ICD-10-CM | POA: Diagnosis not present

## 2018-01-01 DIAGNOSIS — I10 Essential (primary) hypertension: Secondary | ICD-10-CM | POA: Insufficient documentation

## 2018-01-01 DIAGNOSIS — N3090 Cystitis, unspecified without hematuria: Secondary | ICD-10-CM | POA: Insufficient documentation

## 2018-01-01 DIAGNOSIS — R112 Nausea with vomiting, unspecified: Secondary | ICD-10-CM | POA: Diagnosis not present

## 2018-01-01 DIAGNOSIS — E039 Hypothyroidism, unspecified: Secondary | ICD-10-CM | POA: Insufficient documentation

## 2018-01-01 DIAGNOSIS — Z79899 Other long term (current) drug therapy: Secondary | ICD-10-CM | POA: Diagnosis not present

## 2018-01-01 LAB — CBC WITH DIFFERENTIAL/PLATELET
BASOS PCT: 0 %
Basophils Absolute: 0 10*3/uL (ref 0.0–0.1)
EOS ABS: 0 10*3/uL (ref 0.0–0.7)
EOS PCT: 0 %
HCT: 30.7 % — ABNORMAL LOW (ref 36.0–46.0)
Hemoglobin: 10.3 g/dL — ABNORMAL LOW (ref 12.0–15.0)
LYMPHS ABS: 0.7 10*3/uL (ref 0.7–4.0)
Lymphocytes Relative: 9 %
MCH: 30.1 pg (ref 26.0–34.0)
MCHC: 33.6 g/dL (ref 30.0–36.0)
MCV: 89.8 fL (ref 78.0–100.0)
Monocytes Absolute: 0.4 10*3/uL (ref 0.1–1.0)
Monocytes Relative: 6 %
NEUTROS PCT: 85 %
Neutro Abs: 6.4 10*3/uL (ref 1.7–7.7)
PLATELETS: 220 10*3/uL (ref 150–400)
RBC: 3.42 MIL/uL — AB (ref 3.87–5.11)
RDW: 14.3 % (ref 11.5–15.5)
WBC: 7.6 10*3/uL (ref 4.0–10.5)

## 2018-01-01 LAB — BASIC METABOLIC PANEL
Anion gap: 6 (ref 5–15)
BUN: 26 mg/dL — AB (ref 8–23)
CHLORIDE: 100 mmol/L (ref 98–111)
CO2: 27 mmol/L (ref 22–32)
CREATININE: 1.24 mg/dL — AB (ref 0.44–1.00)
Calcium: 8.1 mg/dL — ABNORMAL LOW (ref 8.9–10.3)
GFR calc non Af Amer: 41 mL/min — ABNORMAL LOW (ref >60)
GFR, EST AFRICAN AMERICAN: 48 mL/min — AB (ref >60)
Glucose, Bld: 96 mg/dL (ref 70–99)
Potassium: 4.8 mmol/L (ref 3.5–5.1)
SODIUM: 133 mmol/L — AB (ref 135–145)

## 2018-01-01 LAB — URINALYSIS, MICROSCOPIC (REFLEX): RBC / HPF: NONE SEEN RBC/hpf (ref 0–5)

## 2018-01-01 LAB — URINALYSIS, ROUTINE W REFLEX MICROSCOPIC
Bilirubin Urine: NEGATIVE
Glucose, UA: NEGATIVE mg/dL
Hgb urine dipstick: NEGATIVE
KETONES UR: NEGATIVE mg/dL
NITRITE: NEGATIVE
PROTEIN: NEGATIVE mg/dL
Specific Gravity, Urine: <1.005 — ABNORMAL LOW (ref 1.005–1.030)
pH: 6 (ref 5.0–8.0)

## 2018-01-01 MED ORDER — SULFAMETHOXAZOLE-TRIMETHOPRIM 800-160 MG PO TABS: 1.0000 | ORAL_TABLET | Freq: Two times a day (BID) | ORAL | 0 refills | Status: DC

## 2018-01-01 MED ORDER — ONDANSETRON HCL 4 MG/2ML IJ SOLN
4.0000 mg | Freq: Once | INTRAMUSCULAR | Status: AC
  Administered 2018-01-01: 4 mg via INTRAVENOUS
  Filled 2018-01-01: qty 2

## 2018-01-01 MED ORDER — ACETAMINOPHEN 500 MG PO TABS
500.0000 mg | ORAL_TABLET | Freq: Once | ORAL | Status: AC
  Administered 2018-01-01: 500 mg via ORAL
  Filled 2018-01-01: qty 1

## 2018-01-01 MED ORDER — ONDANSETRON 4 MG PO TBDP: 4.0000 mg | ORAL_TABLET | Freq: Three times a day (TID) | ORAL | 0 refills | Status: DC | PRN

## 2018-01-01 NOTE — ED Notes (Signed)
ED Provider at bedside. 

## 2018-01-01 NOTE — ED Provider Notes (Signed)
[facility] EMERGENCY DEPARTMENT Provider Note   CSN: [id number] Arrival date & time: 01/01/18  4098     History   Chief Complaint No chief complaint on file.   HPI Julie Wilkerson is a 77 y.o. female.  HPI Patient presents with dysuria.  Has had for the last few days.  Seen at urgent care and diagnosed with UTI.  Started on Bactrim yesterday.  Now has more burning.  Has had one episode of vomiting yesterday and also states she has had chills but did not check a fever.  States she felt cold all the time.  Dull low back pain.  No flank pain.  Had recent constipation and reportedly drank prune juice and had that cleared out. Past Medical History:  Diagnosis Date  . Asthma   . Breast cancer ([place])    2002, left, encapsulated, microcalcifications. lumpectomy, radtiation x 30  . Breast cancer in female [facility])   . Change in mole 06/08/2016  . Colitis   . Decreased visual acuity 01/16/2017  . Dyspnea 11/04/2016  . Dysuria 11/04/2016  . Gluten intolerance   . History of chicken pox   . History of Helicobacter pylori infection 08/01/2015  . Hyperlipidemia   . Hypertension   . Hypothyroid   . IBS (irritable bowel syndrome)   . Left leg pain 03/01/2017  . Low back pain 08/10/2015  . Neck pain 03/01/2017  . Osteoporosis   . RLQ discomfort 03/01/2017  . Stomach cramps   . Urinary tract infection 11/04/2016    Patient Active Problem List   Diagnosis Date Noted  . Urinary frequency 12/22/2017  . Right hip pain 10/23/2017  . Elevated sed rate 10/23/2017  . Influenza due to identified influenza virus 09/04/2017  . Bronchitis 04/26/2017  . Acute pansinusitis 04/26/2017  . Memory loss 03/28/2017  . Bilateral carotid bruits 03/28/2017  . Left leg pain 03/01/2017  . Neck pain 03/01/2017  . RLQ discomfort 03/01/2017  . Decreased visual acuity 01/16/2017  . 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 Hyperglycemia 11/07/2016  . Restless sleeper  11/07/2016  . Abdominal pain 11/07/2016  . Change in mole 06/08/2016  . 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 Vitamin D deficiency 04/29/2016  . Low back pain 08/10/2015  . History of Helicobacter pylori infection 08/01/2015  . Cough variant asthma  vs UACS from ACEi    . Breast cancer in female [facility])   . Hyperlipidemia   . Essential hypertension   . Osteoporosis   . Hypothyroid   . IBS (irritable bowel syndrome)   . Gluten intolerance   . History of chicken pox     Past Surgical History:  Procedure Laterality Date  . ABDOMINAL HYSTERECTOMY     partial  . APPENDECTOMY    . BREAST LUMPECTOMY Left 2002  . CHOLECYSTECTOMY  Age 15 or 60  . COLONOSCOPY  2017  . FRACTURE SURGERY    . TONSILLECTOMY       OB History   None      Home Medications    Prior to Admission medications   Medication Sig Start Date End Date Taking? Authorizing Provider  alendronate (FOSAMAX) 70 MG tablet Take 1 tablet (70 mg total) by mouth once a week. Take with a full glass of water on an empty stomach. 10/18/17   [name], MD  aspirin 81 MG tablet Take 81 mg by mouth daily.    [provider]  beclomethasone (QVAR  REDIHALER) 80 MCG/ACT inhaler Inhale 2 puffs into the lungs 2 (two) times daily. 12/02/16   Tanda Rockers, MD  citalopram (CELEXA) 20 MG tablet Take 1 tablet (20 mg total) by mouth daily. 12/19/17   Saguier, Percell Miller, PA-C  colestipol (COLESTID) 1 g tablet Take 1 tablet (1 g total) by mouth 2 (two) times daily. 08/05/17   Levin Erp, PA  Collagen-Vitamin C (COLLAGEN PLUS VITAMIN C PO) Take 1 tablet by mouth daily.    [provider]  famciclovir (FAMVIR) 500 MG tablet Take 1 tablet (500 mg total) by mouth 3 (three) times daily. 12/19/17   Saguier, Percell Miller, PA-C  hydrocortisone (ANUSOL-HC) 2.5 % rectal cream Place 1 application rectally 2 (two) times daily. 08/02/17   [name], MD  hyoscyamine (LEVSIN SL) 0.125 MG SL tablet Place 1 tablet  (0.125 mg total) under the tongue every 4 (four) hours as needed for cramping. 12/19/17   Saguier, Percell Miller, PA-C  levothyroxine (SYNTHROID, LEVOTHROID) 50 MCG tablet Take 1 tablet (50 mcg total) by mouth daily. 12/19/17   Saguier, Percell Miller, PA-C  lisinopril (PRINIVIL,ZESTRIL) 20 MG tablet Take 1 tablet (20 mg total) by mouth daily. 12/19/17   Saguier, Percell Miller, PA-C  metoprolol succinate (TOPROL-XL) 25 MG 24 hr tablet Take 1 tablet (25 mg total) by mouth daily. 12/19/17   Saguier, Percell Miller, PA-C  metroNIDAZOLE (FLAGYL) 500 MG tablet Take 1 tablet (500 mg total) by mouth 3 (three) times daily. 12/19/17   Saguier, Percell Miller, PA-C  Multiple Vitamins-Minerals (MULTIVITAMIN ADULT PO) Take 1 tablet by mouth daily.    [provider]  omeprazole (PRILOSEC) 20 MG capsule Take 1 tab by mouth before breakfast and dinner. 08/05/17   Levin Erp, PA  ondansetron (ZOFRAN-ODT) 4 MG disintegrating tablet Take 1 tablet (4 mg total) by mouth every 8 (eight) hours as needed for nausea or vomiting. 01/01/18   Davonna Belling, MD  sulfamethoxazole-trimethoprim (BACTRIM DS,SEPTRA DS) 800-160 MG tablet Take 1 tablet by mouth 2 (two) times daily. 01/03/18   Davonna Belling, MD    Family History Family History  Problem Relation Age of Onset  . Colitis Mother   . Irritable bowel syndrome Mother   . Hypertension Father   . Hyperlipidemia Brother   . Heart attack Brother   . Stomach cancer Paternal Grandmother 42  . Hyperlipidemia Brother   . Heart attack Brother   . Hyperlipidemia Brother   . Heart attack Brother   . Hyperlipidemia Sister   . Leukemia Daughter   . Arthritis Daughter   . Heart disease Daughter        ASD vs VSD  . Colon cancer Neg Hx     Social History Social History   Tobacco Use  . Smoking status: Never Smoker  . Smokeless tobacco: Never Used  Substance Use Topics  . Alcohol use: No    Alcohol/week: 0.0 standard drinks  . Drug use: No     Allergies   Patient has no known  allergies.   Review of Systems Review of Systems  Constitutional: Negative for appetite change.  HENT: Negative for congestion.   Respiratory: Negative for shortness of breath.   Cardiovascular: Negative for chest pain.  Gastrointestinal: Positive for nausea and vomiting.  Genitourinary: Positive for dysuria and frequency.  Musculoskeletal: Positive for back pain.  Skin: Negative for rash.  Neurological: Negative for weakness.  Psychiatric/Behavioral: Negative for confusion.     Physical Exam Updated Vital Signs BP (!) 104/55 (BP Location: Right Arm)  Pulse 67   Temp 98.1 F (36.7 C) (Oral)   Resp 20   Ht 4\' 11"  (1.499 m)   Wt 56 kg   SpO2 98%   BMI 24.94 kg/m   Physical Exam  Constitutional: She appears well-developed.  Eyes: Pupils are equal, round, and reactive to light.  Neck: Neck supple.  Cardiovascular: Normal rate.  Pulmonary/Chest: Effort normal.  Abdominal: There is tenderness.  Mild right lower quadrant tenderness without rebound or guarding.  Genitourinary:  Genitourinary Comments: No CVA tenderness  Musculoskeletal: She exhibits no edema.  Neurological: She is alert. No cranial nerve deficit.  Skin: Capillary refill takes less than 2 seconds.  Psychiatric: She has a normal mood and affect.     ED Treatments / Results  Labs (all labs ordered are listed, but only abnormal results are displayed) Labs Reviewed  URINALYSIS, ROUTINE W REFLEX MICROSCOPIC - Abnormal; Notable for the following components:      Result Value   Specific Gravity, Urine <1.005 (*)    Leukocytes, UA SMALL (*)    All other components within normal limits  BASIC METABOLIC PANEL - Abnormal; Notable for the following components:   Sodium 133 (*)    BUN 26 (*)    Creatinine, Ser 1.24 (*)    Calcium 8.1 (*)    GFR calc non Af Amer 41 (*)    GFR calc Af Amer 48 (*)    All other components within normal limits  CBC WITH DIFFERENTIAL/PLATELET - Abnormal; Notable for the  following components:   RBC 3.42 (*)    Hemoglobin 10.3 (*)    HCT 30.7 (*)    All other components within normal limits  URINALYSIS, MICROSCOPIC (REFLEX) - Abnormal; Notable for the following components:   Bacteria, UA FEW (*)    All other components within normal limits  URINE CULTURE    EKG None  Radiology No results found.  Procedures Procedures (including critical care time)  Medications Ordered in ED Medications  ondansetron (ZOFRAN) injection 4 mg (4 mg Intravenous Given 01/01/18 0827)  acetaminophen (TYLENOL) tablet 500 mg (500 mg Oral Given 01/01/18 0827)     Initial Impression / Assessment and Plan / ED Course  I have reviewed the triage vital signs and the nursing notes.  Pertinent labs & imaging results that were available during my care of the patient were reviewed by me and considered in my medical decision making (see chart for details).     Patient with urinary tract infection.  Diagnosed yesterday.  Now low back pain and one episode of vomiting.  Recent constipation also.  Has cystitis.  Bactrim given for more days since with fever and movement of the pain could move to a pyelonephritis.  PCP has sent culture and I also have sent a culture.  Both can be followed.  Discharge home.  Final Clinical Impressions(s) / ED Diagnoses   Final diagnoses:  Cystitis    ED Discharge Orders         Ordered    sulfamethoxazole-trimethoprim (BACTRIM DS,SEPTRA DS) 800-160 MG tablet  2 times daily     01/01/18 0918    ondansetron (ZOFRAN-ODT) 4 MG disintegrating tablet  Every 8 hours PRN     01/01/18 1007           Davonna Belling, MD 01/01/18 1616

## 2018-01-01 NOTE — Discharge Instructions (Addendum)
Continue to take the Bactrim.  Your culture should come back in a couple days.  You may need a few more days of antibiotics and you can fill that if the culture shows bacteria susceptible.  Have Dr. Charlett Blake also follow with the results.

## 2018-01-01 NOTE — ED Triage Notes (Signed)
Was seen at [facility] yesterday and dx with UTI , started  on Bactrim and has had 1 dose. Last night low back pain and chills. Dysuria

## 2018-01-02 LAB — URINE CULTURE: Culture: NO GROWTH

## 2018-01-02 NOTE — Telephone Encounter (Signed)
Patient seen in the ER yesterday 01/01/18

## 2018-01-04 ENCOUNTER — Encounter: Payer: Self-pay | Admitting: Physician Assistant

## 2018-01-04 ENCOUNTER — Ambulatory Visit: Payer: Medicare HMO | Admitting: Physician Assistant

## 2018-01-04 ENCOUNTER — Encounter: Payer: Self-pay | Admitting: Family Medicine

## 2018-01-04 VITALS — BP 98/62 | HR 68 | Ht 59.0 in | Wt 126.4 lb

## 2018-01-04 DIAGNOSIS — R1084 Generalized abdominal pain: Secondary | ICD-10-CM

## 2018-01-04 DIAGNOSIS — R194 Change in bowel habit: Secondary | ICD-10-CM

## 2018-01-04 DIAGNOSIS — K219 Gastro-esophageal reflux disease without esophagitis: Secondary | ICD-10-CM | POA: Diagnosis not present

## 2018-01-04 DIAGNOSIS — R110 Nausea: Secondary | ICD-10-CM | POA: Diagnosis not present

## 2018-01-04 MED ORDER — LINACLOTIDE 72 MCG PO CAPS: 72.0000 ug | ORAL_CAPSULE | Freq: Every day | ORAL | 3 refills | Status: DC

## 2018-01-04 MED ORDER — OMEPRAZOLE 40 MG PO CPDR: 40.0000 mg | DELAYED_RELEASE_CAPSULE | Freq: Two times a day (BID) | ORAL | 1 refills | Status: DC

## 2018-01-04 NOTE — Patient Instructions (Signed)
We have sent the following medications to your pharmacy for you to pick up at your convenience:   Linzess 72 mcg daily.   Use levsin three times a day 20-30 minutes before meals.   Continue Zofran as needed for nausea.   Increase Omeprazole 40 mg twice a day.   We have given you a sample of Plenvu bowel prep.

## 2018-01-04 NOTE — Progress Notes (Signed)
Agree with assessment and plan as outlined.  

## 2018-01-04 NOTE — [place] Notes (Signed)
Chief Complaint: Abdominal pain, change in bowel habits and nausea  HPI:    Julie Wilkerson is a 77 year old female with a past medical history as listed below, who follows with Dr. Havery Moros for suspected IBS as well as reflux, who presents to clinic today with a complaint of abdominal pain, change in bowel habits and nausea.    07/2015 EGD and colonoscopy.  EGD with mild erosive esophagitis and antritis with erosions in the gastric lumen and normal duodenal bulb.  The esophagus was dilated with 18 mm savory over a wire.  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    08/19/2017 office visit Dr. Havery Moros at that time is doing well on Omeprazole 20mg  and Colestid 1 g twice a day as well as hyoscyamine as needed.  It was recommended that she continue Colestid and Zofran 4 mg every 4 hours for nausea as well as trial FD guard for dyspepsia and IBgard for her lower abdominal cramps she was continued on Levsin as needed.     Today, tells me that over the past 3 months she has had worsening abdominal pain.  Describes pain in her epigastrium and tells me she has stopped eating so much food as she feels like food gets stuck at the top of her stomach at times and will result in a burning in her epigastrium as well as a right upper quadrant pain.  This can sometimes radiate through to her back.  Associated with nausea which is constant.  Currently, using Omeprazole 20 mg daily and Zofran daily.  These do help slightly with her symptoms.    Also describes a change in her bowel habits, was having diarrhea chronically and this has changed to constipation.  Saw her PCP 2 weeks ago who did an x-ray showing constipation and patient was told to clean out.  She did so with magnesium citrate and did feel better when she had a clean bowel but it seems like after the next day or 2 all of her symptoms came back.  Associated symptoms include a lower  abdominal pain which seems to radiate over into her left lower quadrant.  Bowel movements have been on the constipated side over the past 2 weeks, she describes one yesterday which was "hard like a rock".    Also recent diagnosis of cystitis for which she was on antibiotics and just finished yesterday, no longer has any urinary symptoms.    Denies fever, chills, symptoms that awaken her from sleep, blood in her stool, melena or significant weight loss.  Past Medical History:  Diagnosis Date  . Asthma   . Breast cancer ([place])    2002, left, encapsulated, microcalcifications. lumpectomy, radtiation x 30  . Breast cancer in female [facility])   . Change in mole 06/08/2016  . Colitis   . Decreased visual acuity 01/16/2017  . Dyspnea 11/04/2016  . Dysuria 11/04/2016  . Gluten intolerance   . History of chicken pox   . History of Helicobacter pylori infection 08/01/2015  . Hyperlipidemia   . Hypertension   . Hypothyroid   . IBS (irritable bowel syndrome)   . Left leg pain 03/01/2017  . Low back pain 08/10/2015  . Neck pain 03/01/2017  . Osteoporosis   . RLQ discomfort 03/01/2017  . Stomach cramps   . Urinary tract infection 11/04/2016    Past Surgical History:  Procedure Laterality Date  . ABDOMINAL HYSTERECTOMY  partial  . APPENDECTOMY    . BREAST LUMPECTOMY Left 2002  . CHOLECYSTECTOMY  Age 75 or 49  . COLONOSCOPY  2017  . FRACTURE SURGERY    . TONSILLECTOMY      Current Outpatient Medications  Medication Sig Dispense Refill  . alendronate (FOSAMAX) 70 MG tablet Take 1 tablet (70 mg total) by mouth once a week. Take with a full glass of water on an empty stomach. 4 tablet 3  . aspirin 81 MG tablet Take 81 mg by mouth daily.    . beclomethasone (QVAR REDIHALER) 80 MCG/ACT inhaler Inhale 2 puffs into the lungs 2 (two) times daily. 1 Inhaler 0  . citalopram (CELEXA) 20 MG tablet Take 1 tablet (20 mg total) by mouth daily. 90 tablet 1  . Collagen-Vitamin C (COLLAGEN PLUS VITAMIN C PO) Take  1 tablet by mouth daily.    . hydrocortisone (ANUSOL-HC) 2.5 % rectal cream Place 1 application rectally 2 (two) times daily. 30 g 0  . hyoscyamine (LEVSIN SL) 0.125 MG SL tablet Place 1 tablet (0.125 mg total) under the tongue every 4 (four) hours as needed for cramping. 30 tablet 2  . levothyroxine (SYNTHROID, LEVOTHROID) 50 MCG tablet Take 1 tablet (50 mcg total) by mouth daily. 90 tablet 1  . lisinopril (PRINIVIL,ZESTRIL) 20 MG tablet Take 1 tablet (20 mg total) by mouth daily. 30 tablet 3  . metoprolol succinate (TOPROL-XL) 25 MG 24 hr tablet Take 1 tablet (25 mg total) by mouth daily. 30 tablet 11  . Multiple Vitamins-Minerals (MULTIVITAMIN ADULT PO) Take 1 tablet by mouth daily.    Marland Kitchen omeprazole (PRILOSEC) 20 MG capsule Take 1 tab by mouth before breakfast and dinner. 60 capsule 3   No current facility-administered medications for this visit.     Allergies as of 01/04/2018  . (No Known Allergies)    Family History  Problem Relation Age of Onset  . Colitis Mother   . Irritable bowel syndrome Mother   . Hypertension Father   . Hyperlipidemia Brother   . Heart attack Brother   . Stomach cancer Paternal Grandmother 3  . Hyperlipidemia Brother   . Heart attack Brother   . Hyperlipidemia Brother   . Heart attack Brother   . Hyperlipidemia Sister   . Leukemia Daughter   . Arthritis Daughter   . Heart disease Daughter        ASD vs VSD  . Colon cancer Neg Hx     Social History   Socioeconomic History  . Marital status: Single    Spouse name: Not on file  . Number of children: Not on file  . Years of education: Not on file  . Highest education level: Not on file  Occupational History  . Occupation: retired    Comment: Pharmacist, hospital, kindergarten  Social Needs  . Financial resource strain: Not on file  . Food insecurity:    Worry: Not on file    Inability: Not on file  . Transportation needs:    Medical: Not on file    Non-medical: Not on file  Tobacco Use  . Smoking  status: Never Smoker  . Smokeless tobacco: Never Used  Substance and Sexual Activity  . Alcohol use: No    Alcohol/week: 0.0 standard drinks  . Drug use: No  . Sexual activity: Never    Comment: lives alone, avoids daiiry and gluten. 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 Physical activity:    Days per week: Not on file  Minutes per session: Not on file  . Stress: Not on file  Relationships  . Social connections:    Talks on phone: Not on file    Gets together: Not on file    Attends religious service: Not on file    Active member of club or organization: Not on file    Attends meetings of clubs or organizations: Not on file    Relationship status: Not on file  . Intimate partner violence:    Fear of current or ex partner: Not on file    Emotionally abused: Not on file    Physically abused: Not on file    Forced sexual activity: Not on file  Other Topics Concern  . Not on file  Social History Narrative  . Not on file    Review of Systems:    Constitutional: No weight loss, fever or chills Cardiovascular: No chest pain Respiratory: No SOB  Gastrointestinal: See HPI and otherwise negative   Physical Exam:  Vital signs: BP 98/62   Pulse 68   Ht 4\' 11"  (1.499 m)   Wt 126 lb 6 oz (57.3 kg)   BMI 25.52 kg/m   Constitutional:   Pleasant Elderly Caucasian female appears to be in NAD, Well developed, Well nourished, alert and cooperative Respiratory: Respirations even and unlabored. Lungs clear to auscultation bilaterally.   No wheezes, crackles, or rhonchi.  Cardiovascular: Normal S1, S2. No MRG. Regular rate and rhythm. No peripheral edema, cyanosis or pallor.  Gastrointestinal:  Soft, midl distension, mild ttp in epigastrum and b/l lower quadrants,No rebound or guarding. Normal bowel sounds. No appreciable masses or hepatomegaly. Psychiatric: Demonstrates good judgement and reason without abnormal affect or behaviors.  RELEVANT LABS AND IMAGING: CBC    Component  Value Date/Time   WBC 7.6 01/01/2018 0814   RBC 3.42 (L) 01/01/2018 0814   HGB 10.3 (L) 01/01/2018 0814   HCT 30.7 (L) 01/01/2018 0814   PLT 220 01/01/2018 0814   MCV 89.8 01/01/2018 0814   MCH 30.1 01/01/2018 0814   MCHC 33.6 01/01/2018 0814   RDW 14.3 01/01/2018 0814   LYMPHSABS 0.7 01/01/2018 0814   MONOABS 0.4 01/01/2018 0814   EOSABS 0.0 01/01/2018 0814   BASOSABS 0.0 01/01/2018 0814    CMP     Component Value Date/Time   NA 133 (L) 01/01/2018 0814   K 4.8 01/01/2018 0814   CL 100 01/01/2018 0814   CO2 27 01/01/2018 0814   GLUCOSE 96 01/01/2018 0814   BUN 26 (H) 01/01/2018 0814   CREATININE 1.24 (H) 01/01/2018 0814   CREATININE 1.20 (H) 07/15/2017 1603   CALCIUM 8.1 (L) 01/01/2018 0814   PROT 7.0 12/22/2017 1636   ALBUMIN 4.2 12/22/2017 1636   AST 20 12/22/2017 1636   ALT 13 12/22/2017 1636   ALKPHOS 48 12/22/2017 1636   BILITOT 0.4 12/22/2017 1636   GFRNONAA 41 (L) 01/01/2018 0814   GFRAA 48 (L) 01/01/2018 9798    Assessment: 1.  Abdominal pain: In the epigastrium and lower quadrants, history of gastritis which could relate to epigastric pain and constipation which could relate to lower abdominal pain +/- IBS +/- pain from recent cystitis 2.  Change in bowel habits: Towards constipation over the past 3 months 3.  Nausea: Continues per patient, some better with Zofran 4. GERD  Plan: 1.  Patient can continue Zofran 4 mg as needed for nausea 2.  Increased Omeprazole to 40 mg twice daily, 30-60 minutes before breakfast and dinner for the  next 3 to 4 weeks, we can then hopefully titrate back down 3.  Would recommend patient go ahead and schedule her Levsin 0.125 mg 3 times daily, 20-30 minutes before meals 4.  Would recommend patient do 1/2 of a suprep today and then use Linzess 72 mcg daily after that.  Sent in a prescription for 30 days with 3 refills 5.  Told patient to call our clinic in 1 week and let us know how she is doing.  If she is not improved would  recommend a CT of the abdomen pelvis (likely w/o contrast due to elevate Crea) for further evaluation. 6.  Pending results of above and symptoms could also discuss repeat EGD/colonoscopy.  These were last done in March 2017.   7.  Patient to follow in clinic with me in 3 to 4 weeks or sooner if necessary.  Ellouise Newer, PA-C [facility] 01/04/2018, 3:14 PM  Cc: [name], MD

## 2018-01-05 DIAGNOSIS — M9901 Segmental and somatic dysfunction of cervical region: Secondary | ICD-10-CM | POA: Diagnosis not present

## 2018-01-05 DIAGNOSIS — M9902 Segmental and somatic dysfunction of thoracic region: Secondary | ICD-10-CM | POA: Diagnosis not present

## 2018-01-05 DIAGNOSIS — M47812 Spondylosis without myelopathy or radiculopathy, cervical region: Secondary | ICD-10-CM | POA: Diagnosis not present

## 2018-01-05 DIAGNOSIS — M542 Cervicalgia: Secondary | ICD-10-CM | POA: Diagnosis not present

## 2018-01-17 ENCOUNTER — Ambulatory Visit (INDEPENDENT_AMBULATORY_CARE_PROVIDER_SITE_OTHER): Payer: Medicare HMO | Admitting: Family Medicine

## 2018-01-17 ENCOUNTER — Encounter: Payer: Self-pay | Admitting: Family Medicine

## 2018-01-17 VITALS — BP 100/58 | HR 52 | Temp 97.8°F | Resp 18 | Ht 59.0 in | Wt 122.2 lb

## 2018-01-17 DIAGNOSIS — Z1239 Encounter for other screening for malignant neoplasm of breast: Secondary | ICD-10-CM

## 2018-01-17 DIAGNOSIS — R109 Unspecified abdominal pain: Secondary | ICD-10-CM

## 2018-01-17 DIAGNOSIS — E559 Vitamin D deficiency, unspecified: Secondary | ICD-10-CM | POA: Diagnosis not present

## 2018-01-17 DIAGNOSIS — D649 Anemia, unspecified: Secondary | ICD-10-CM | POA: Insufficient documentation

## 2018-01-17 DIAGNOSIS — Z1231 Encounter for screening mammogram for malignant neoplasm of breast: Secondary | ICD-10-CM | POA: Diagnosis not present

## 2018-01-17 DIAGNOSIS — K5900 Constipation, unspecified: Secondary | ICD-10-CM

## 2018-01-17 DIAGNOSIS — E782 Mixed hyperlipidemia: Secondary | ICD-10-CM | POA: Diagnosis not present

## 2018-01-17 DIAGNOSIS — R739 Hyperglycemia, unspecified: Secondary | ICD-10-CM | POA: Diagnosis not present

## 2018-01-17 DIAGNOSIS — R700 Elevated erythrocyte sedimentation rate: Secondary | ICD-10-CM

## 2018-01-17 DIAGNOSIS — E039 Hypothyroidism, unspecified: Secondary | ICD-10-CM | POA: Diagnosis not present

## 2018-01-17 DIAGNOSIS — K9041 Non-celiac gluten sensitivity: Secondary | ICD-10-CM

## 2018-01-17 DIAGNOSIS — I10 Essential (primary) hypertension: Secondary | ICD-10-CM | POA: Diagnosis not present

## 2018-01-17 HISTORY — DX: Constipation, unspecified: K59.00

## 2018-01-17 LAB — COMPREHENSIVE METABOLIC PANEL
ALBUMIN: 4.2 g/dL (ref 3.5–5.2)
ALT: 15 U/L (ref 0–35)
AST: 19 U/L (ref 0–37)
Alkaline Phosphatase: 53 U/L (ref 39–117)
BUN: 38 mg/dL — ABNORMAL HIGH (ref 6–23)
CALCIUM: 9.5 mg/dL (ref 8.4–10.5)
CHLORIDE: 105 meq/L (ref 96–112)
CO2: 27 meq/L (ref 19–32)
Creatinine, Ser: 1.17 mg/dL (ref 0.40–1.20)
GFR: 47.78 mL/min — AB (ref >60.00)
Glucose, Bld: 79 mg/dL (ref 70–99)
POTASSIUM: 4.9 meq/L (ref 3.5–5.1)
Sodium: 138 mEq/L (ref 135–145)
Total Bilirubin: 0.6 mg/dL (ref 0.2–1.2)
Total Protein: 7 g/dL (ref 6.0–8.3)

## 2018-01-17 LAB — LIPID PANEL
CHOLESTEROL: 239 mg/dL — AB (ref 0–200)
HDL: 58.4 mg/dL (ref >39.00)
LDL CALC: 149 mg/dL — AB (ref 0–99)
NonHDL: 180.77
Total CHOL/HDL Ratio: 4
Triglycerides: 157 mg/dL — ABNORMAL HIGH (ref 0.0–149.0)
VLDL: 31.4 mg/dL (ref 0.0–40.0)

## 2018-01-17 LAB — URINALYSIS, ROUTINE W REFLEX MICROSCOPIC
BILIRUBIN URINE: NEGATIVE
Ketones, ur: NEGATIVE
Nitrite: POSITIVE — AB
Specific Gravity, Urine: 1.015 (ref 1.000–1.030)
TOTAL PROTEIN, URINE-UPE24: NEGATIVE
Urine Glucose: NEGATIVE
Urobilinogen, UA: 0.2 (ref 0.0–1.0)
pH: 5.5 (ref 5.0–8.0)

## 2018-01-17 LAB — CBC
HEMATOCRIT: 32.7 % — AB (ref 36.0–46.0)
Hemoglobin: 11.2 g/dL — ABNORMAL LOW (ref 12.0–15.0)
MCHC: 34.2 g/dL (ref 30.0–36.0)
MCV: 89.2 fl (ref 78.0–100.0)
PLATELETS: 243 10*3/uL (ref 150.0–400.0)
RBC: 3.67 Mil/uL — AB (ref 3.87–5.11)
RDW: 14.3 % (ref 11.5–15.5)
WBC: 4.6 10*3/uL (ref 4.0–10.5)

## 2018-01-17 LAB — SEDIMENTATION RATE: SED RATE: 56 mm/h — AB (ref 0–30)

## 2018-01-17 LAB — TSH: TSH: 1.24 u[IU]/mL (ref 0.35–4.50)

## 2018-01-17 MED ORDER — HYOSCYAMINE SULFATE 0.125 MG SL SUBL: 0.1250 mg | SUBLINGUAL_TABLET | SUBLINGUAL | 2 refills | Status: DC | PRN

## 2018-01-17 MED ORDER — CITALOPRAM HYDROBROMIDE 20 MG PO TABS: 20.0000 mg | ORAL_TABLET | Freq: Every day | ORAL | 1 refills | Status: DC

## 2018-01-17 MED ORDER — LISINOPRIL 20 MG PO TABS: 20.0000 mg | ORAL_TABLET | Freq: Every day | ORAL | 3 refills | Status: DC

## 2018-01-17 NOTE — Progress Notes (Signed)
Subjective:    Patient ID: Julie Wilkerson, female    DOB: 1941/03/19, 77 y.o.   MRN: [id number]  No chief complaint on file.   HPI Patient is in today for follow up. She is feeling some bette. No recent febrile illness or hospitalizations and her abdominal pain is improving some.  No bloody or tarry stool but some low-grade constipation still present.  No vomiting but some nausea noted.  Overall she notes her energy is improving some. Denies CP/palp/SOB/HA/congestion/fevers/GI or GU c/o. Taking meds as prescribed  Past Medical History:  Diagnosis Date  . Asthma   . Breast cancer ([place])    2002, left, encapsulated, microcalcifications. lumpectomy, radtiation x 30  . Breast cancer in female [facility])   . Change in mole 06/08/2016  . Colitis   . Decreased visual acuity 01/16/2017  . Dyspnea 11/04/2016  . Dysuria 11/04/2016  . Gluten intolerance   . History of chicken pox   . History of Helicobacter pylori infection 08/01/2015  . Hyperlipidemia   . Hypertension   . Hypothyroid   . IBS (irritable bowel syndrome)   . Left leg pain 03/01/2017  . Low back pain 08/10/2015  . Neck pain 03/01/2017  . Osteoporosis   . RLQ discomfort 03/01/2017  . Stomach cramps   . Urinary tract infection 11/04/2016    Past Surgical History:  Procedure Laterality Date  . ABDOMINAL HYSTERECTOMY     partial  . APPENDECTOMY    . BREAST LUMPECTOMY Left 2002  . CHOLECYSTECTOMY  Age 37 or 23  . COLONOSCOPY  2017  . FRACTURE SURGERY    . TONSILLECTOMY      Family History  Problem Relation Age of Onset  . Colitis Mother   . Irritable bowel syndrome Mother   . Hypertension Father   . Hyperlipidemia Brother   . Heart attack Brother   . Stomach cancer Paternal Grandmother 29  . Hyperlipidemia Brother   . Heart attack Brother   . Hyperlipidemia Brother   . Heart attack Brother   . Hyperlipidemia Sister   . Leukemia Daughter   . Arthritis Daughter   . Heart disease Daughter        ASD vs VSD  . Colon cancer  Neg Hx     Social History   Socioeconomic History  . Marital status: Single    Spouse name: Not on file  . Number of children: Not on file  . Years of education: Not on file  . Highest education level: Not on file  Occupational History  . Occupation: retired    Comment: Pharmacist, hospital, kindergarten  Social Needs  . Financial resource strain: Not on file  . Food insecurity:    Worry: Not on file    Inability: Not on file  . Transportation needs:    Medical: Not on file    Non-medical: Not on file  Tobacco Use  . Smoking status: Never Smoker  . Smokeless tobacco: Never Used  Substance and Sexual Activity  . Alcohol use: No    Alcohol/week: 0.0 standard drinks  . Drug use: No  . Sexual activity: Never    Comment: lives alone, avoids daiiry and gluten. 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 Physical activity:    Days per week: Not on file    Minutes per session: Not on file  . Stress: Not on file  Relationships  . Social connections:    Talks on phone: Not on file    Gets together: Not on  file    Attends religious service: Not on file    Active member of club or organization: Not on file    Attends meetings of clubs or organizations: Not on file    Relationship status: Not on file  . Intimate partner violence:    Fear of current or ex partner: Not on file    Emotionally abused: Not on file    Physically abused: Not on file    Forced sexual activity: Not on file  Other Topics Concern  . Not on file  Social History Narrative  . Not on file    Outpatient Medications Prior to Visit  Medication Sig Dispense Refill  . alendronate (FOSAMAX) 70 MG tablet Take 1 tablet (70 mg total) by mouth once a week. Take with a full glass of water on an empty stomach. 4 tablet 3  . aspirin 81 MG tablet Take 81 mg by mouth daily.    . beclomethasone (QVAR REDIHALER) 80 MCG/ACT inhaler Inhale 2 puffs into the lungs 2 (two) times daily. 1 Inhaler 0  . Collagen-Vitamin C (COLLAGEN PLUS  VITAMIN C PO) Take 1 tablet by mouth daily.    . hydrocortisone (ANUSOL-HC) 2.5 % rectal cream Place 1 application rectally 2 (two) times daily. 30 g 0  . levothyroxine (SYNTHROID, LEVOTHROID) 50 MCG tablet Take 1 tablet (50 mcg total) by mouth daily. 90 tablet 1  . linaclotide (LINZESS) 72 MCG capsule Take 1 capsule (72 mcg total) by mouth daily before breakfast. 30 capsule 3  . metoprolol succinate (TOPROL-XL) 25 MG 24 hr tablet Take 1 tablet (25 mg total) by mouth daily. 30 tablet 11  . Multiple Vitamins-Minerals (MULTIVITAMIN ADULT PO) Take 1 tablet by mouth daily.    Marland Kitchen omeprazole (PRILOSEC) 40 MG capsule Take 1 capsule (40 mg total) by mouth 2 (two) times daily. 60 capsule 1  . citalopram (CELEXA) 20 MG tablet Take 1 tablet (20 mg total) by mouth daily. 90 tablet 1  . hyoscyamine (LEVSIN SL) 0.125 MG SL tablet Place 1 tablet (0.125 mg total) under the tongue every 4 (four) hours as needed for cramping. 30 tablet 2  . lisinopril (PRINIVIL,ZESTRIL) 20 MG tablet Take 1 tablet (20 mg total) by mouth daily. 30 tablet 3   No facility-administered medications prior to visit.     No Known Allergies  Review of Systems  Constitutional: Negative for chills, fever and malaise/fatigue.  HENT: Negative for congestion and hearing loss.   Eyes: Negative for discharge.  Respiratory: Negative for cough, sputum production and shortness of breath.   Cardiovascular: Negative for chest pain, palpitations and leg swelling.  Gastrointestinal: Positive for abdominal pain, constipation and nausea. Negative for blood in stool, diarrhea, heartburn and vomiting.  Genitourinary: Negative for dysuria, frequency, hematuria and urgency.  Musculoskeletal: Negative for back pain, falls and myalgias.  Skin: Negative for rash.  Neurological: Negative for dizziness, sensory change, loss of consciousness, weakness and headaches.  Endo/Heme/Allergies: Negative for environmental allergies. Does not bruise/bleed easily.    Psychiatric/Behavioral: Negative for depression and suicidal ideas. The patient is not nervous/anxious and does not have insomnia.        Objective:    Physical Exam  Constitutional: She is oriented to person, place, and time. She appears well-developed and well-nourished. No distress.  HENT:  Head: Normocephalic and atraumatic.  Nose: Nose normal.  Eyes: Right eye exhibits no discharge. Left eye exhibits no discharge.  Neck: Normal range of motion. Neck supple.  Cardiovascular: Normal rate and regular  rhythm.  No murmur heard. Pulmonary/Chest: Effort normal and breath sounds normal.  Abdominal: Soft. Bowel sounds are normal. There is no tenderness.  Musculoskeletal: She exhibits no edema.  Neurological: She is alert and oriented to person, place, and time.  Skin: Skin is warm and dry.  Psychiatric: She has a normal mood and affect.  Nursing note and vitals reviewed.   BP (!) 100/58 (BP Location: Left Arm, Patient Position: Sitting, Cuff Size: Normal)   Pulse (!) 52   Temp 97.8 F (36.6 C) (Oral)   Resp 18   Ht 4\' 11"  (1.499 m)   Wt 122 lb 3.2 oz (55.4 kg)   SpO2 96%   BMI 24.68 kg/m  Wt Readings from Last 3 Encounters:  01/17/18 122 lb 3.2 oz (55.4 kg)  01/04/18 126 lb 6 oz (57.3 kg)  01/01/18 123 lb 7.3 oz (56 kg)     Lab Results  Component Value Date   WBC 4.6 01/17/2018   HGB 11.2 (L) 01/17/2018   HCT 32.7 (L) 01/17/2018   PLT 243.0 01/17/2018   GLUCOSE 79 01/17/2018   CHOL 239 (H) 01/17/2018   TRIG 157.0 (H) 01/17/2018   HDL 58.40 01/17/2018   LDLCALC 149 (H) 01/17/2018   ALT 15 01/17/2018   AST 19 01/17/2018   NA 138 01/17/2018   K 4.9 01/17/2018   CL 105 01/17/2018   CREATININE 1.17 01/17/2018   BUN 38 (H) 01/17/2018   CO2 27 01/17/2018   TSH 1.24 01/17/2018   HGBA1C 6.0 12/22/2017    Lab Results  Component Value Date   TSH 1.24 01/17/2018   Lab Results  Component Value Date   WBC 4.6 01/17/2018   HGB 11.2 (L) 01/17/2018   HCT 32.7 (L)  01/17/2018   MCV 89.2 01/17/2018   PLT 243.0 01/17/2018   Lab Results  Component Value Date   NA 138 01/17/2018   K 4.9 01/17/2018   CO2 27 01/17/2018   GLUCOSE 79 01/17/2018   BUN 38 (H) 01/17/2018   CREATININE 1.17 01/17/2018   BILITOT 0.6 01/17/2018   ALKPHOS 53 01/17/2018   AST 19 01/17/2018   ALT 15 01/17/2018   PROT 7.0 01/17/2018   ALBUMIN 4.2 01/17/2018   CALCIUM 9.5 01/17/2018   ANIONGAP 6 01/01/2018   GFR 47.78 (L) 01/17/2018   Lab Results  Component Value Date   CHOL 239 (H) 01/17/2018   Lab Results  Component Value Date   HDL 58.40 01/17/2018   Lab Results  Component Value Date   LDLCALC 149 (H) 01/17/2018   Lab Results  Component Value Date   TRIG 157.0 (H) 01/17/2018   Lab Results  Component Value Date   CHOLHDL 4 01/17/2018   Lab Results  Component Value Date   HGBA1C 6.0 12/22/2017       Assessment & Plan:   Problem List Items Addressed This Visit    Hyperlipidemia    Encouraged heart healthy diet, increase exercise, avoid trans fats, consider a krill oil cap daily      Relevant Medications   lisinopril (PRINIVIL,ZESTRIL) 20 MG tablet   Other Relevant Orders   Lipid panel (Completed)   Essential hypertension    Well controlled, no changes to meds. Encouraged heart healthy diet such as the DASH diet and exercise as tolerated.       Relevant Medications   lisinopril (PRINIVIL,ZESTRIL) 20 MG tablet   Other Relevant Orders   CBC (Completed)   Comprehensive metabolic panel (Completed)   TSH (  Completed)   Sedimentation rate (Completed)   Hypothyroid    On Levothyroxine, continue to monitor.      Gluten intolerance    Is avoiding gluten and is eating a vegetarian diet and that is helping some. Check a food allergy panel      Relevant Orders   Food Allergy Profile (Completed)   Vitamin D deficiency    Monitor and supplement      Hyperglycemia    hgba1c acceptable, minimize simple carbs. Increase exercise as tolerated.  Continue current meds      Abdominal pain   Relevant Orders   Food Allergy Profile (Completed)   Urinalysis   Urine Culture (Completed)   Elevated sed rate    Check level      Anemia    Check cbc.       Constipation    Encouraged increased hydration and fiber in diet. Daily probiotics. If bowels not moving can use MOM 2 tbls po in 4 oz of warm prune juice by mouth every 2-3 days. If no results then repeat in 4 hours with  Dulcolax suppository pr, may repeat again in 4 more hours as needed. Seek care if symptoms worsen. Consider daily Miralax and/or Dulcolax if symptoms persist.        Other Visit Diagnoses    Breast cancer screening    -  Primary   Relevant Orders   Lipid panel (Completed)   MM 3D SCREEN BREAST BILATERAL      I am having Julie Wilkerson start on ciprofloxacin. I am also having her maintain her Multiple Vitamins-Minerals (MULTIVITAMIN ADULT PO), Collagen-Vitamin C (COLLAGEN PLUS VITAMIN C PO), aspirin, beclomethasone, hydrocortisone, alendronate, levothyroxine, metoprolol succinate, omeprazole, linaclotide, citalopram, lisinopril, and hyoscyamine.  Meds ordered this encounter  Medications  . citalopram (CELEXA) 20 MG tablet    Sig: Take 1 tablet (20 mg total) by mouth daily.    Dispense:  90 tablet    Refill:  1  . lisinopril (PRINIVIL,ZESTRIL) 20 MG tablet    Sig: Take 1 tablet (20 mg total) by mouth daily.    Dispense:  30 tablet    Refill:  3  . DISCONTD: hyoscyamine (LEVSIN SL) 0.125 MG SL tablet    Sig: Place 1 tablet (0.125 mg total) under the tongue every 4 (four) hours as needed for cramping.    Dispense:  30 tablet    Refill:  2  . hyoscyamine (LEVSIN SL) 0.125 MG SL tablet    Sig: Place 1 tablet (0.125 mg total) under the tongue every 4 (four) hours as needed for cramping.    Dispense:  30 tablet    Refill:  2  . ciprofloxacin (CIPRO) 250 MG tablet    Sig: Take 1 tablet (250 mg total) by mouth 2 (two) times daily. For 3 days    Dispense:  6  tablet    Refill:  0     Penni Homans, MD

## 2018-01-17 NOTE — Patient Instructions (Signed)
shingrix is the new shingles shot 2 shots over 2-6 months at pharmacy Constipation, Adult Constipation is when a person has fewer bowel movements in a week than normal, has difficulty having a bowel movement, or has stools that are dry, hard, or larger than normal. Constipation may be caused by an underlying condition. It may become worse with age if a person takes certain medicines and does not take in enough fluids. Follow these instructions at home: Eating and drinking   Eat foods that have a lot of fiber, such as fresh fruits and vegetables, whole grains, and beans.  Limit foods that are high in fat, low in fiber, or overly processed, such as french fries, hamburgers, cookies, candies, and soda.  Drink enough fluid to keep your urine clear or pale yellow. General instructions  Exercise regularly or as told by your health care provider.  Go to the restroom when you have the urge to go. Do not hold it in.  Take over-the-counter and prescription medicines only as told by your health care provider. These include any fiber supplements.  Practice pelvic floor retraining exercises, such as deep breathing while relaxing the lower abdomen and pelvic floor relaxation during bowel movements.  Watch your condition for any changes.  Keep all follow-up visits as told by your health care provider. This is important. Contact a health care provider if:  You have pain that gets worse.  You have a fever.  You do not have a bowel movement after 4 days.  You vomit.  You are not hungry.  You lose weight.  You are bleeding from the anus.  You have thin, pencil-like stools. Get help right away if:  You have a fever and your symptoms suddenly get worse.  You leak stool or have blood in your stool.  Your abdomen is bloated.  You have severe pain in your abdomen.  You feel dizzy or you faint. This information is not intended to replace advice given to you by your health care provider.  Make sure you discuss any questions you have with your health care provider. Document Released: 02/06/2004 Document Revised: 11/28/2015 Document Reviewed: 10/29/2015 Elsevier Interactive Patient Education  2018 [employer].

## 2018-01-17 NOTE — Assessment & Plan Note (Signed)
On Levothyroxine, continue to monitor 

## 2018-01-17 NOTE — Assessment & Plan Note (Signed)
Monitor and supplement 

## 2018-01-17 NOTE — Assessment & Plan Note (Signed)
Check cbc 

## 2018-01-17 NOTE — Assessment & Plan Note (Signed)
Well controlled, no changes to meds. Encouraged heart healthy diet such as the DASH diet and exercise as tolerated.  °

## 2018-01-17 NOTE — Assessment & Plan Note (Signed)
Encouraged increased hydration and fiber in diet. Daily probiotics. If bowels not moving can use MOM 2 tbls po in 4 oz of warm prune juice by mouth every 2-3 days. If no results then repeat in 4 hours with  Dulcolax suppository pr, may repeat again in 4 more hours as needed. Seek care if symptoms worsen. Consider daily Miralax and/or Dulcolax if symptoms persist.  

## 2018-01-17 NOTE — Assessment & Plan Note (Signed)
Check level 

## 2018-01-17 NOTE — Assessment & Plan Note (Signed)
Is avoiding gluten and is eating a vegetarian diet and that is helping some. Check a food allergy panel

## 2018-01-17 NOTE — Assessment & Plan Note (Signed)
Encouraged heart healthy diet, increase exercise, avoid trans fats, consider a krill oil cap daily 

## 2018-01-17 NOTE — Assessment & Plan Note (Signed)
hgba1c acceptable, minimize simple carbs. Increase exercise as tolerated. Continue current meds 

## 2018-01-18 LAB — FOOD ALLERGY PROFILE
Allergen, Salmon, f41: <0.1 kU/L
Almonds: <0.1 kU/L
CLASS: 0
CLASS: 0
CLASS: 0
CLASS: 0
CLASS: 0
CLASS: 0
CLASS: 0
CLASS: 0
CLASS: 0
CLASS: 0
CLASS: 0
CLASS: 0
CLASS: 0
CLASS: 0
CLASS: 0
Scallop IgE: <0.1 kU/L
Sesame Seed f10: <0.1 kU/L
Walnut: <0.1 kU/L

## 2018-01-18 LAB — INTERPRETATION:

## 2018-01-19 DIAGNOSIS — M9901 Segmental and somatic dysfunction of cervical region: Secondary | ICD-10-CM | POA: Diagnosis not present

## 2018-01-19 DIAGNOSIS — M47812 Spondylosis without myelopathy or radiculopathy, cervical region: Secondary | ICD-10-CM | POA: Diagnosis not present

## 2018-01-19 DIAGNOSIS — M542 Cervicalgia: Secondary | ICD-10-CM | POA: Diagnosis not present

## 2018-01-19 DIAGNOSIS — M9902 Segmental and somatic dysfunction of thoracic region: Secondary | ICD-10-CM | POA: Diagnosis not present

## 2018-01-19 LAB — URINE CULTURE
MICRO NUMBER:: 91022966
SPECIMEN QUALITY:: ADEQUATE

## 2018-01-20 MED ORDER — CIPROFLOXACIN HCL 250 MG PO TABS: 250.0000 mg | ORAL_TABLET | Freq: Two times a day (BID) | ORAL | 0 refills | Status: DC

## 2018-01-31 DIAGNOSIS — M9901 Segmental and somatic dysfunction of cervical region: Secondary | ICD-10-CM | POA: Diagnosis not present

## 2018-01-31 DIAGNOSIS — M47812 Spondylosis without myelopathy or radiculopathy, cervical region: Secondary | ICD-10-CM | POA: Diagnosis not present

## 2018-01-31 DIAGNOSIS — M542 Cervicalgia: Secondary | ICD-10-CM | POA: Diagnosis not present

## 2018-01-31 DIAGNOSIS — M9902 Segmental and somatic dysfunction of thoracic region: Secondary | ICD-10-CM | POA: Diagnosis not present

## 2018-02-02 DIAGNOSIS — H43811 Vitreous degeneration, right eye: Secondary | ICD-10-CM | POA: Diagnosis not present

## 2018-02-02 DIAGNOSIS — H5702 Anisocoria: Secondary | ICD-10-CM | POA: Diagnosis not present

## 2018-02-02 DIAGNOSIS — Z9889 Other specified postprocedural states: Secondary | ICD-10-CM | POA: Diagnosis not present

## 2018-02-02 DIAGNOSIS — H53002 Unspecified amblyopia, left eye: Secondary | ICD-10-CM | POA: Diagnosis not present

## 2018-02-02 DIAGNOSIS — H25013 Cortical age-related cataract, bilateral: Secondary | ICD-10-CM | POA: Diagnosis not present

## 2018-02-02 DIAGNOSIS — H2513 Age-related nuclear cataract, bilateral: Secondary | ICD-10-CM | POA: Diagnosis not present

## 2018-02-02 DIAGNOSIS — H25042 Posterior subcapsular polar age-related cataract, left eye: Secondary | ICD-10-CM | POA: Diagnosis not present

## 2018-02-07 ENCOUNTER — Ambulatory Visit: Payer: Self-pay | Admitting: Physician Assistant

## 2018-02-09 ENCOUNTER — Ambulatory Visit: Payer: Self-pay | Admitting: Gastroenterology

## 2018-02-09 ENCOUNTER — Encounter

## 2018-02-14 ENCOUNTER — Ambulatory Visit: Payer: Self-pay

## 2018-02-15 ENCOUNTER — Ambulatory Visit
Admission: RE | Admit: 2018-02-15 | Discharge: 2018-02-15 | Disposition: A | Discharge status: Home / Self Care | Payer: Medicare HMO | Source: Ambulatory Visit | Attending: Family Medicine | Admitting: Family Medicine

## 2018-02-15 DIAGNOSIS — Z1231 Encounter for screening mammogram for malignant neoplasm of breast: Secondary | ICD-10-CM | POA: Diagnosis not present

## 2018-02-15 DIAGNOSIS — Z1239 Encounter for other screening for malignant neoplasm of breast: Secondary | ICD-10-CM

## 2018-02-15 HISTORY — DX: Personal history of irradiation: Z92.3

## 2018-02-22 ENCOUNTER — Ambulatory Visit: Payer: Medicare HMO | Admitting: Gastroenterology

## 2018-02-22 ENCOUNTER — Encounter: Payer: Self-pay | Admitting: Gastroenterology

## 2018-02-22 VITALS — BP 120/60 | HR 56 | Ht <= 58 in | Wt 124.1 lb

## 2018-02-22 DIAGNOSIS — R194 Change in bowel habit: Secondary | ICD-10-CM

## 2018-02-22 DIAGNOSIS — R112 Nausea with vomiting, unspecified: Secondary | ICD-10-CM | POA: Diagnosis not present

## 2018-02-22 DIAGNOSIS — R630 Anorexia: Secondary | ICD-10-CM

## 2018-02-22 DIAGNOSIS — R1084 Generalized abdominal pain: Secondary | ICD-10-CM | POA: Diagnosis not present

## 2018-02-22 HISTORY — DX: Anorexia: R63.0

## 2018-02-22 HISTORY — DX: Change in bowel habit: R19.4

## 2018-02-22 MED ORDER — NA SULFATE-K SULFATE-MG SULF 17.5-3.13-1.6 GM/177ML PO SOLN: ORAL | 0 refills | Status: DC

## 2018-02-22 NOTE — Progress Notes (Signed)
02/22/2018 Mckenzie Bove [id number] July 05, 1940   HISTORY OF PRESENT ILLNESS: This is a 77 year old female who is a patient of Dr. Doyne Keel.  She was seen by both him and Ellouise Newer, PA-C on a couple of occasions recently but was placed on my schedule for today.  It appears that she has chronic complaints of alternating bowel habits, abdominal pain, nausea, intermittent vomiting, poor appetite.  07/2015 EGD and colonoscopy. EGD with mild erosive esophagitis and antritis with erosions in the gastric lumen and normal duodenal bulb. The esophagus was dilated with 18 mm savory over a wire. 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   She does had a CT scan performed of the abdomen and pelvis without contrast in August that showed distended stomach, small hiatal hernia, prominent stool in the colon, sigmoid diverticulosis.  She then also had one on with contrast in February 2016 that was also relatively unremarkable.  At her last visit with Anderson Malta in August Jennifer recommended a bowel cleanse with Suprep and then started her on Linzess.  She says that she did not take the [place] because it was expensive and with the samples that she was given her daughter who apparently is also PA told her that she should not take it because it was not good for her.  She states "I just want to know what was wrong with my stomach".  Also proceeds to tell me that she has had issues with her stomach since she was an infant.  She is on omeprazole 40 mg twice daily and also uses Levsin as needed for cramping, which she says does help.  She tells me that she has no appetite.  Even the smell of food makes her nauseated.  She says that yesterday she had 7 stools, but they were all normal in consistency, not diarrhea.  Complains of generalized abdominal pain.    Past Medical History:  Diagnosis Date  . Asthma   . Breast cancer ([place])    2002, left, encapsulated, microcalcifications. lumpectomy, radtiation x 30  . Breast cancer in female [facility])   . Change in mole 06/08/2016  . Colitis   . Decreased visual acuity 01/16/2017  . Dyspnea 11/04/2016  . Dysuria 11/04/2016  . Gluten intolerance   . History of chicken pox   . History of Helicobacter pylori infection 08/01/2015  . Hyperlipidemia   . Hypertension   . Hypothyroid   . IBS (irritable bowel syndrome)   . Left leg pain 03/01/2017  . Low back pain 08/10/2015  . Neck pain 03/01/2017  . Osteoporosis   . Personal history of radiation therapy   . RLQ discomfort 03/01/2017  . Stomach cramps   . Urinary tract infection 11/04/2016   Past Surgical History:  Procedure Laterality Date  . ABDOMINAL HYSTERECTOMY     partial  . APPENDECTOMY    . BREAST LUMPECTOMY Left 2002  . CHOLECYSTECTOMY  Age 30 or 10  . COLONOSCOPY  2017  . FRACTURE SURGERY    . TONSILLECTOMY      reports that she has never smoked. She has never used smokeless tobacco. She reports that she does not drink alcohol or use drugs. family history includes Arthritis in her daughter; Colitis in her mother; Heart attack in her brother, brother, and brother; Heart disease in her daughter; Hyperlipidemia in her brother, brother, brother, and sister; Hypertension in her father; Irritable bowel syndrome in her mother; Leukemia in  her daughter; Stomach cancer (age of onset: 70) in her paternal grandmother. No Known Allergies    Outpatient Encounter Medications as of 02/22/2018  Medication Sig  . alendronate (FOSAMAX) 70 MG tablet Take 1 tablet (70 mg total) by mouth once a week. Take with a full glass of water on an empty stomach.  Marland Kitchen aspirin 81 MG tablet Take 81 mg by mouth daily.  . beclomethasone (QVAR REDIHALER) 80 MCG/ACT inhaler Inhale 2 puffs into the lungs 2 (two) times daily.  . citalopram (CELEXA) 20 MG tablet Take 1 tablet (20 mg total) by mouth daily.  . Collagen-Vitamin C (COLLAGEN PLUS VITAMIN C PO) Take 1  tablet by mouth daily.  . hydrocortisone (ANUSOL-HC) 2.5 % rectal cream Place 1 application rectally 2 (two) times daily.  . hyoscyamine (LEVSIN SL) 0.125 MG SL tablet Place 1 tablet (0.125 mg total) under the tongue every 4 (four) hours as needed for cramping.  Marland Kitchen levothyroxine (SYNTHROID, LEVOTHROID) 50 MCG tablet Take 1 tablet (50 mcg total) by mouth daily.  Marland Kitchen lisinopril (PRINIVIL,ZESTRIL) 20 MG tablet Take 1 tablet (20 mg total) by mouth daily.  . metoprolol succinate (TOPROL-XL) 25 MG 24 hr tablet Take 1 tablet (25 mg total) by mouth daily.  . Multiple Vitamins-Minerals (MULTIVITAMIN ADULT PO) Take 1 tablet by mouth daily.  Marland Kitchen omeprazole (PRILOSEC) 40 MG capsule Take 1 capsule (40 mg total) by mouth 2 (two) times daily.  . [DISCONTINUED] ciprofloxacin (CIPRO) 250 MG tablet Take 1 tablet (250 mg total) by mouth 2 (two) times daily. For 3 days  . [DISCONTINUED] linaclotide (LINZESS) 72 MCG capsule Take 1 capsule (72 mcg total) by mouth daily before breakfast.   No facility-administered encounter medications on file as of 02/22/2018.      REVIEW OF SYSTEMS  : All other systems reviewed and negative except where noted in the History of Present Illness.   PHYSICAL EXAM: BP 120/60 (BP Location: Left Arm, Patient Position: Sitting, Cuff Size: Normal)   Pulse (!) 56   Ht 4' 8.75" (1.441 m) Comment: height measured without shoes  Wt 124 lb 2 oz (56.3 kg)   BMI 27.10 kg/m  General: Well developed female in no acute distress Head: Normocephalic and atraumatic Eyes:  Sclerae anicteric, conjunctiva pink. Ears: Normal auditory acuity Lungs: Clear throughout to auscultation; no increased WOB. Heart: Regular rate and rhythm; no M/R/G. Abdomen: Soft, non-distended.  BS present.  Diffuse TTP. Rectal:  Will be done at the time of colonoscopy. Musculoskeletal: Symmetrical with no gross deformities  Skin: No lesions on visible extremities Extremities: No edema  Neurological: Alert oriented x 4,  grossly non-focal Psychological:  Alert and cooperative. Normal mood and affect  ASSESSMENT AND PLAN: *77 year old female with complaints of alternating bowel habits, generalized abdominal pain, poor appetite, nausea, and intermittent vomiting.  The symptoms have been present ongoing for years it seems.  Her running diagnoses are IBS and reflux.  I think that the main purpose for her visit today is that she wants EGD and colonoscopy although she just had them 2.5 years ago.  I told her that we are not likely going to find anything different that is causing her symptoms and complaints, but will hopefully be able to put this to rest.  She is also had recent CT scans.  It does not sound like she is very open or compliant to trying things that we suggest.  Levsin apparently does help some of her abdominal cramping so she can continue that for  now.  In regards to her alternating bowel habits I have asked her to begin a daily powder fiber supplement such as Benefiber or Citrucel.  Will schedule for EGD and colonoscopy with Dr. Havery Moros.  **The risks, benefits, and alternatives to EGD and colonoscopy were discussed with the patient and she consents to proceed.    CC:  [name], MD

## 2018-02-22 NOTE — Patient Instructions (Addendum)
If you are age 76 or older, your body mass index should be between 23-30. Your Body mass index is 27.1 kg/m. If this is out of the aforementioned range listed, please consider follow up with your Primary Care Provider.  If you are age 87 or younger, your body mass index should be between 19-25. Your Body mass index is 27.1 kg/m. If this is out of the aformentioned range listed, please consider follow up with your Primary Care Provider.   You have been scheduled for an endoscopy and colonoscopy. Please follow the written instructions given to you at your visit today. Please pick up your prep supplies at the pharmacy within the next 1-3 days. If you use inhalers (even only as needed), please bring them with you on the day of your procedure. Your physician has requested that you go to www.startemmi.com and enter the access code given to you at your visit today. This web site gives a general overview about your procedure. However, you should still follow specific instructions given to you by our office regarding your preparation for the procedure.  We have sent the following medications to your pharmacy for you to pick up at your convenience: Suprep  Start Benefiber or Citrucel powder in 8 ounces of liquid daily.  Thank you for choosing me and [facility].  Alonza Bogus, PA-C

## 2018-02-23 ENCOUNTER — Encounter: Payer: Self-pay | Admitting: Family Medicine

## 2018-02-23 NOTE — Progress Notes (Signed)
Agree with assessment with the following additions. I think repeat colonoscopy and EGD in this patient is going to be low yield, if the bowel prep was adequate on her last exams. CT imaging does not show anything concerning. She likely has functional symptoms. If you think she needs these for piece of mind we can proceed, otherwise I would be happy to see her back in clinic first to reassess / reassure her. Thanks

## 2018-02-24 MED ORDER — CITALOPRAM HYDROBROMIDE 20 MG PO TABS: 30.0000 mg | ORAL_TABLET | Freq: Every day | ORAL | 0 refills | Status: DC

## 2018-03-01 DIAGNOSIS — M545 Low back pain: Secondary | ICD-10-CM | POA: Diagnosis not present

## 2018-03-01 DIAGNOSIS — M9903 Segmental and somatic dysfunction of lumbar region: Secondary | ICD-10-CM | POA: Diagnosis not present

## 2018-03-01 DIAGNOSIS — M9901 Segmental and somatic dysfunction of cervical region: Secondary | ICD-10-CM | POA: Diagnosis not present

## 2018-03-01 DIAGNOSIS — M62830 Muscle spasm of back: Secondary | ICD-10-CM | POA: Diagnosis not present

## 2018-03-02 DIAGNOSIS — H25012 Cortical age-related cataract, left eye: Secondary | ICD-10-CM | POA: Diagnosis not present

## 2018-03-02 DIAGNOSIS — H2512 Age-related nuclear cataract, left eye: Secondary | ICD-10-CM | POA: Diagnosis not present

## 2018-03-02 DIAGNOSIS — H25042 Posterior subcapsular polar age-related cataract, left eye: Secondary | ICD-10-CM | POA: Diagnosis not present

## 2018-03-06 DIAGNOSIS — H25042 Posterior subcapsular polar age-related cataract, left eye: Secondary | ICD-10-CM | POA: Diagnosis not present

## 2018-03-06 DIAGNOSIS — H2512 Age-related nuclear cataract, left eye: Secondary | ICD-10-CM | POA: Diagnosis not present

## 2018-03-06 DIAGNOSIS — H2181 Floppy iris syndrome: Secondary | ICD-10-CM | POA: Diagnosis not present

## 2018-03-06 DIAGNOSIS — H25812 Combined forms of age-related cataract, left eye: Secondary | ICD-10-CM | POA: Diagnosis not present

## 2018-03-06 DIAGNOSIS — H25012 Cortical age-related cataract, left eye: Secondary | ICD-10-CM | POA: Diagnosis not present

## 2018-03-17 ENCOUNTER — Ambulatory Visit: Payer: Medicare HMO | Admitting: Gastroenterology

## 2018-03-17 ENCOUNTER — Encounter: Payer: Self-pay | Admitting: Gastroenterology

## 2018-03-17 VITALS — BP 134/68 | HR 64 | Ht <= 58 in | Wt 123.0 lb

## 2018-03-17 DIAGNOSIS — R109 Unspecified abdominal pain: Secondary | ICD-10-CM | POA: Diagnosis not present

## 2018-03-17 DIAGNOSIS — R1013 Epigastric pain: Secondary | ICD-10-CM

## 2018-03-17 DIAGNOSIS — R194 Change in bowel habit: Secondary | ICD-10-CM | POA: Diagnosis not present

## 2018-03-17 MED ORDER — OMEPRAZOLE 40 MG PO CPDR: 40.0000 mg | DELAYED_RELEASE_CAPSULE | Freq: Every day | ORAL | 1 refills | Status: DC

## 2018-03-17 NOTE — Progress Notes (Signed)
HPI :  77 y/o female here for a follow up visit.   She has been seen in the past for suspected irritable bowel syndrome and dyspepsia.  She is had a negative workup for celiac disease including serologic testing and EGD.  She had negative stool testing in the past as well as colonoscopy in 2017 at [facility] (although biopsies not taken).   At my last visit with her in March she had complained of diarrhea. I placed her on a trial of Colestid and his worked well for her diarrhea. At some point in time she became constipated. She's been seen twice in our clinic by our PAs in August and earlier this month for intermittent abdominal pains and constipation more recently. She's had 2 CT scans of the abdomen and pelvis this year for these symptoms, without any significant pathology. Her labs have remained stable. She's been given Zofran, trial of FD gard, Levsin, and Linzess in recent months. Overall she states her symptoms have been ongoing for years and are not new. A few weeks ago she was scheduled for an EGD and colonoscopy for these symptoms. She is currently scheduled for these in a few weeks. We discussed if she really needs to have these done.  She reports at this point in time her dyspepsia has been feeling a lot better. She is not having much of any nausea or vomiting. Her weight stable. She has rare postprandial discomfort but for the most part doing pretty well. She is on Prilosec 40 mg twice daily with good control of her reflux symptoms. Her bowel habits have been either mild constipation to normal more recently. Her daughter is a PA and recommended a trial of a probiotic for her. She's been taking this and over the past few weeks thinks it helped and her bloating and discomfort feels improved. She does endorse occasional left-sided abdominal pain which can relate to her left lower leg. This is also in the setting of back pain. She applies heating pad and states this makes her feel better.   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 Ileum normal. No polyps or evidence of colitis. Biopsies not taken. EGD 08/05/2015 - mild gastritis on path, mild esophagitis with esophagus with normal path, normal duodenum on path   Past Medical History:  Diagnosis Date  . Asthma   . Breast cancer ([place])    2002, left, encapsulated, microcalcifications. lumpectomy, radtiation x 30  . Breast cancer in female [facility])   . Change in mole 06/08/2016  . Colitis   . Decreased visual acuity 01/16/2017  . Dyspnea 11/04/2016  . Dysuria 11/04/2016  . Gluten intolerance   . History of chicken pox   . History of Helicobacter pylori infection 08/01/2015  . Hyperlipidemia   . Hypertension   . Hypothyroid   . IBS (irritable bowel syndrome)   . Left leg pain 03/01/2017  . Low back pain 08/10/2015  . Neck pain 03/01/2017  . Osteoporosis   . Personal history of radiation therapy   . RLQ discomfort 03/01/2017  . Stomach cramps   . Urinary tract infection 11/04/2016     Past Surgical History:  Procedure Laterality Date  . ABDOMINAL HYSTERECTOMY     partial  . APPENDECTOMY    . BREAST LUMPECTOMY Left 2002  . CHOLECYSTECTOMY  Age 65 or 80  . COLONOSCOPY  2017  .  FRACTURE SURGERY    . TONSILLECTOMY     Family History  Problem Relation Age of Onset  . Colitis Mother   . Irritable bowel syndrome Mother   . Hypertension Father   . Hyperlipidemia Brother   . Heart attack Brother   . Stomach cancer Paternal Grandmother 8  . Hyperlipidemia Brother   . Heart attack Brother   . Hyperlipidemia Brother   . Heart attack Brother   . Hyperlipidemia Sister   . Leukemia Daughter   . Arthritis Daughter   . Heart disease Daughter        ASD vs VSD  . Colon cancer Neg Hx    Social History   Tobacco Use  . Smoking status: Never Smoker  . Smokeless tobacco: Never Used  Substance Use Topics  . Alcohol use:  No    Alcohol/week: 0.0 standard drinks  . Drug use: No   Current Outpatient Medications  Medication Sig Dispense Refill  . alendronate (FOSAMAX) 70 MG tablet Take 1 tablet (70 mg total) by mouth once a week. Take with a full glass of water on an empty stomach. 4 tablet 3  . aspirin 81 MG tablet Take 81 mg by mouth daily.    . beclomethasone (QVAR REDIHALER) 80 MCG/ACT inhaler Inhale 2 puffs into the lungs 2 (two) times daily. 1 Inhaler 0  . citalopram (CELEXA) 20 MG tablet Take 1.5 tablets (30 mg total) by mouth daily. 135 tablet 0  . Collagen-Vitamin C (COLLAGEN PLUS VITAMIN C PO) Take 1 tablet by mouth daily.    . hydrocortisone (ANUSOL-HC) 2.5 % rectal cream Place 1 application rectally 2 (two) times daily. 30 g 0  . levothyroxine (SYNTHROID, LEVOTHROID) 50 MCG tablet Take 1 tablet (50 mcg total) by mouth daily. 90 tablet 1  . lisinopril (PRINIVIL,ZESTRIL) 20 MG tablet Take 1 tablet (20 mg total) by mouth daily. 30 tablet 3  . metoprolol succinate (TOPROL-XL) 25 MG 24 hr tablet Take 1 tablet (25 mg total) by mouth daily. 30 tablet 11  . Multiple Vitamins-Minerals (MULTIVITAMIN ADULT PO) Take 1 tablet by mouth daily.    Marland Kitchen omeprazole (PRILOSEC) 40 MG capsule Take 1 capsule (40 mg total) by mouth 2 (two) times daily. 60 capsule 1   No current facility-administered medications for this visit.    No Known Allergies   Review of Systems: All systems reviewed and negative except where noted in HPI.    Lab Results  Component Value Date   WBC 4.6 01/17/2018   HGB 11.2 (L) 01/17/2018   HCT 32.7 (L) 01/17/2018   MCV 89.2 01/17/2018   PLT 243.0 01/17/2018    Lab Results  Component Value Date   CREATININE 1.17 01/17/2018   BUN 38 (H) 01/17/2018   NA 138 01/17/2018   K 4.9 01/17/2018   CL 105 01/17/2018   CO2 27 01/17/2018    Lab Results  Component Value Date   ALT 15 01/17/2018   AST 19 01/17/2018   ALKPHOS 53 01/17/2018   BILITOT 0.6 01/17/2018     Physical Exam: BP  134/68 (BP Location: Left Arm, Patient Position: Sitting, Cuff Size: Normal)   Pulse 64   Ht 4' 8.75" (1.441 m)   Wt 123 lb (55.8 kg)   BMI 26.85 kg/m  Constitutional: Pleasant,well-developed, female in no acute distress. HEENT: Normocephalic and atraumatic. Conjunctivae are normal. No scleral icterus. Neck supple.  Cardiovascular: Normal rate, regular rhythm.  Pulmonary/chest: Effort normal and breath sounds normal. No wheezing, rales or  rhonchi. Abdominal: Soft, nondistended, nontender.  There are no masses palpable. No hepatomegaly. Extremities: no edema Lymphadenopathy: No cervical adenopathy noted. Neurological: Alert and oriented to person place and time. Skin: Skin is warm and dry. No rashes noted. Psychiatric: Normal mood and affect. Behavior is normal.   ASSESSMENT AND PLAN: 77 year old female here for reassessment following issues:  Change in bowel habits / dyspepsia / abdominal pain - reassured patient regarding negative CT findings and stable labs. Her symptoms are chronic. Her prior EGD and colonoscopy for the symptoms 2 years ago were unremarkable. I reassured her that I don't think we are missing anything concerning. I suspect she likely has functional symptoms - IBS / dyspepsia. She has found that a daily probiotic has provided significant benefit in recent weeks. If that is the case she should continue that. In regards to her omeprazole dosing, discussed long-term risks associated with PPIs. She does have a history of osteoporosis. I recommend long-term the lowest dose of PPI needed to control her symptoms. She thinks she has done okay on a lower dose of omeprazole the past, we'll reduce omeprazole 40 mg once daily for a month. If she does well after she can reduce it further. I don't feel strongly that she warrants EGD and colonoscopy at this time, especially that she is feeling better, we'll cancel those procedures. She can follow-up with me as needed for these issues. I  otherwise suspect her left abdominal / lower back / leg pain is musculoskeletal and she will continue supportive measures for that.  [place] Cellar, MD [facility]

## 2018-03-17 NOTE — Patient Instructions (Addendum)
If you are age 76 or older, your body mass index should be between 23-30. Your Body mass index is 26.85 kg/m. If this is out of the aforementioned range listed, please consider follow up with your Primary Care Provider.  If you are age 35 or younger, your body mass index should be between 19-25. Your Body mass index is 26.85 kg/m. If this is out of the aformentioned range listed, please consider follow up with your Primary Care Provider.   We are cancelling your endoscopy/colonoscopy procedure scheduled for 03-30-18.  Please decrease your Prilosec to once a day.  Continue taking your probiotic.   Thank you for entrusting me with your care and for choosing [facility], Dr. [place] Cellar

## 2018-03-23 ENCOUNTER — Ambulatory Visit (HOSPITAL_BASED_OUTPATIENT_CLINIC_OR_DEPARTMENT_OTHER)
Admission: RE | Admit: 2018-03-23 | Discharge: 2018-03-23 | Disposition: A | Discharge status: Home / Self Care | Payer: Medicare HMO | Source: Ambulatory Visit | Attending: Family Medicine | Admitting: Family Medicine

## 2018-03-23 ENCOUNTER — Other Ambulatory Visit: Payer: Self-pay | Admitting: Family Medicine

## 2018-03-23 ENCOUNTER — Ambulatory Visit (INDEPENDENT_AMBULATORY_CARE_PROVIDER_SITE_OTHER): Payer: Medicare HMO | Admitting: Family Medicine

## 2018-03-23 ENCOUNTER — Encounter: Payer: Self-pay | Admitting: Family Medicine

## 2018-03-23 ENCOUNTER — Encounter

## 2018-03-23 VITALS — BP 117/57 | HR 54 | Temp 98.1°F | Resp 18 | Ht <= 58 in | Wt 123.8 lb

## 2018-03-23 DIAGNOSIS — E559 Vitamin D deficiency, unspecified: Secondary | ICD-10-CM

## 2018-03-23 DIAGNOSIS — M5442 Lumbago with sciatica, left side: Secondary | ICD-10-CM | POA: Insufficient documentation

## 2018-03-23 DIAGNOSIS — E039 Hypothyroidism, unspecified: Secondary | ICD-10-CM

## 2018-03-23 DIAGNOSIS — M898X6 Other specified disorders of bone, lower leg: Secondary | ICD-10-CM | POA: Diagnosis present

## 2018-03-23 DIAGNOSIS — M79605 Pain in left leg: Secondary | ICD-10-CM | POA: Diagnosis not present

## 2018-03-23 DIAGNOSIS — R739 Hyperglycemia, unspecified: Secondary | ICD-10-CM

## 2018-03-23 DIAGNOSIS — M79606 Pain in leg, unspecified: Secondary | ICD-10-CM

## 2018-03-23 DIAGNOSIS — M545 Low back pain, unspecified: Secondary | ICD-10-CM

## 2018-03-23 DIAGNOSIS — K589 Irritable bowel syndrome without diarrhea: Secondary | ICD-10-CM

## 2018-03-23 DIAGNOSIS — R700 Elevated erythrocyte sedimentation rate: Secondary | ICD-10-CM | POA: Diagnosis not present

## 2018-03-23 DIAGNOSIS — Z23 Encounter for immunization: Secondary | ICD-10-CM | POA: Diagnosis not present

## 2018-03-23 DIAGNOSIS — R109 Unspecified abdominal pain: Secondary | ICD-10-CM

## 2018-03-23 DIAGNOSIS — I10 Essential (primary) hypertension: Secondary | ICD-10-CM

## 2018-03-23 DIAGNOSIS — M5137 Other intervertebral disc degeneration, lumbosacral region: Secondary | ICD-10-CM | POA: Diagnosis not present

## 2018-03-23 LAB — COMPREHENSIVE METABOLIC PANEL
ALT: 16 U/L (ref 0–35)
AST: 23 U/L (ref 0–37)
Albumin: 4.4 g/dL (ref 3.5–5.2)
Alkaline Phosphatase: 47 U/L (ref 39–117)
BUN: 34 mg/dL — AB (ref 6–23)
CO2: 27 mEq/L (ref 19–32)
Calcium: 9.5 mg/dL (ref 8.4–10.5)
Chloride: 103 mEq/L (ref 96–112)
Creatinine, Ser: 1.14 mg/dL (ref 0.40–1.20)
GFR: 49.21 mL/min — AB (ref >60.00)
GLUCOSE: 72 mg/dL (ref 70–99)
POTASSIUM: 4.7 meq/L (ref 3.5–5.1)
SODIUM: 136 meq/L (ref 135–145)
Total Bilirubin: 0.6 mg/dL (ref 0.2–1.2)
Total Protein: 7.5 g/dL (ref 6.0–8.3)

## 2018-03-23 LAB — CBC WITH DIFFERENTIAL/PLATELET
Basophils Absolute: 0.1 10*3/uL (ref 0.0–0.1)
Basophils Relative: 0.9 % (ref 0.0–3.0)
EOS PCT: 0 % (ref 0.0–5.0)
Eosinophils Absolute: 0 10*3/uL (ref 0.0–0.7)
HCT: 34.1 % — ABNORMAL LOW (ref 36.0–46.0)
Hemoglobin: 11.6 g/dL — ABNORMAL LOW (ref 12.0–15.0)
LYMPHS ABS: 1.9 10*3/uL (ref 0.7–4.0)
Lymphocytes Relative: 33.3 % (ref 12.0–46.0)
MCHC: 34.1 g/dL (ref 30.0–36.0)
MCV: 89.4 fl (ref 78.0–100.0)
MONO ABS: 0.6 10*3/uL (ref 0.1–1.0)
MONOS PCT: 11.5 % (ref 3.0–12.0)
Neutro Abs: 3 10*3/uL (ref 1.4–7.7)
Neutrophils Relative %: 54.3 % (ref 43.0–77.0)
PLATELETS: 204 10*3/uL (ref 150.0–400.0)
RBC: 3.81 Mil/uL — ABNORMAL LOW (ref 3.87–5.11)
RDW: 14.3 % (ref 11.5–15.5)
WBC: 5.6 10*3/uL (ref 4.0–10.5)

## 2018-03-23 LAB — SEDIMENTATION RATE: SED RATE: 43 mm/h — AB (ref 0–30)

## 2018-03-23 NOTE — Patient Instructions (Signed)

## 2018-03-26 NOTE — Progress Notes (Signed)
Subjective:    Patient ID: Julie Wilkerson, female    DOB: November 25, 1940, 77 y.o.   MRN: [id number]  No chief complaint on file.   HPI Patient is in today for follow up.  She notes her stomach is doing much better.  She has stopped her probiotic and changed her diet some and is doing better.  No significant abdominal pain and only occasional nausea is noted.  No bloody or tarry stool.  She still has a low appetite but eats regularly.  She is noting some episodes of having slightly low blood pressure and feeling weak at home.  Her greatest complaint though is her low back pain she notes after any period of standing her pain gets bad enough she has to sit to recover.  No recent fall or trauma.  No change in incontinence.  She has radicular symptoms worsening down her left leg over the last couple of months. Denies CP/palp/SOB/HA/congestion/fevers/GI or GU c/o. Taking meds as prescribed  Past Medical History:  Diagnosis Date  . Asthma   . Breast cancer ([place])    2002, left, encapsulated, microcalcifications. lumpectomy, radtiation x 30  . Breast cancer in female [facility])   . Change in mole 06/08/2016  . Colitis   . Decreased visual acuity 01/16/2017  . Dyspnea 11/04/2016  . Dysuria 11/04/2016  . Gluten intolerance   . History of chicken pox   . History of Helicobacter pylori infection 08/01/2015  . Hyperlipidemia   . Hypertension   . Hypothyroid   . IBS (irritable bowel syndrome)   . Left leg pain 03/01/2017  . Low back pain 08/10/2015  . Neck pain 03/01/2017  . Osteoporosis   . Personal history of radiation therapy   . RLQ discomfort 03/01/2017  . Stomach cramps   . Urinary tract infection 11/04/2016    Past Surgical History:  Procedure Laterality Date  . ABDOMINAL HYSTERECTOMY     partial  . APPENDECTOMY    . BREAST LUMPECTOMY Left 2002  . CHOLECYSTECTOMY  Age 35 or 72  . COLONOSCOPY  2017  . EYE SURGERY Left    cataract  . FRACTURE SURGERY    . TONSILLECTOMY      Family History    Problem Relation Age of Onset  . Colitis Mother   . Irritable bowel syndrome Mother   . Hypertension Father   . Hyperlipidemia Brother   . Heart attack Brother   . Stomach cancer Paternal Grandmother 67  . Hyperlipidemia Brother   . Heart attack Brother   . Hyperlipidemia Brother   . Heart attack Brother   . Hyperlipidemia Sister   . Leukemia Daughter   . Arthritis Daughter   . Heart disease Daughter        ASD vs VSD  . Colon cancer Neg Hx     Social History   Socioeconomic History  . Marital status: Single    Spouse name: Not on file  . Number of children: Not on file  . Years of education: Not on file  . Highest education level: Not on file  Occupational History  . Occupation: retired    Comment: Pharmacist, hospital, kindergarten  Social Needs  . Financial resource strain: Not on file  . Food insecurity:    Worry: Not on file    Inability: Not on file  . Transportation needs:    Medical: Not on file    Non-medical: Not on file  Tobacco Use  . Smoking status: Never Smoker  . Smokeless  tobacco: Never Used  Substance and Sexual Activity  . Alcohol use: No    Alcohol/week: 0.0 standard drinks  . Drug use: No  . Sexual activity: Never    Comment: lives alone, avoids daiiry and gluten. 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 Physical activity:    Days per week: Not on file    Minutes per session: Not on file  . Stress: Not on file  Relationships  . Social connections:    Talks on phone: Not on file    Gets together: Not on file    Attends religious service: Not on file    Active member of club or organization: Not on file    Attends meetings of clubs or organizations: Not on file    Relationship status: Not on file  . Intimate partner violence:    Fear of current or ex partner: Not on file    Emotionally abused: Not on file    Physically abused: Not on file    Forced sexual activity: Not on file  Other Topics Concern  . Not on file  Social History Narrative  .  Not on file    Outpatient Medications Prior to Visit  Medication Sig Dispense Refill  . alendronate (FOSAMAX) 70 MG tablet Take 1 tablet (70 mg total) by mouth once a week. Take with a full glass of water on an empty stomach. 4 tablet 3  . aspirin 81 MG tablet Take 81 mg by mouth daily.    . beclomethasone (QVAR REDIHALER) 80 MCG/ACT inhaler Inhale 2 puffs into the lungs 2 (two) times daily. 1 Inhaler 0  . citalopram (CELEXA) 20 MG tablet Take 1.5 tablets (30 mg total) by mouth daily. 135 tablet 0  . Collagen-Vitamin C (COLLAGEN PLUS VITAMIN C PO) Take 1 tablet by mouth daily.    . hydrocortisone (ANUSOL-HC) 2.5 % rectal cream Place 1 application rectally 2 (two) times daily. 30 g 0  . levothyroxine (SYNTHROID, LEVOTHROID) 50 MCG tablet Take 1 tablet (50 mcg total) by mouth daily. 90 tablet 1  . lisinopril (PRINIVIL,ZESTRIL) 20 MG tablet Take 1 tablet (20 mg total) by mouth daily. 30 tablet 3  . metoprolol succinate (TOPROL-XL) 25 MG 24 hr tablet Take 1 tablet (25 mg total) by mouth daily. 30 tablet 11  . Multiple Vitamins-Minerals (MULTIVITAMIN ADULT PO) Take 1 tablet by mouth daily.    Marland Kitchen omeprazole (PRILOSEC) 40 MG capsule Take 1 capsule (40 mg total) by mouth daily. 60 capsule 1  . Probiotic Product (PROBIOTIC DAILY PO) Take by mouth daily.     No facility-administered medications prior to visit.     No Known Allergies  Review of Systems  Constitutional: Positive for malaise/fatigue. Negative for fever.  HENT: Negative for congestion.   Eyes: Negative for blurred vision.  Respiratory: Negative for shortness of breath.   Cardiovascular: Negative for chest pain, palpitations and leg swelling.  Gastrointestinal: Positive for nausea. Negative for abdominal pain and blood in stool.  Genitourinary: Negative for dysuria and frequency.  Musculoskeletal: Positive for back pain and joint pain. Negative for falls.  Skin: Negative for rash.  Neurological: Positive for weakness. Negative for  dizziness, loss of consciousness and headaches.  Endo/Heme/Allergies: Negative for environmental allergies.  Psychiatric/Behavioral: Negative for depression. The patient is nervous/anxious.        Objective:    Physical Exam  Constitutional: She is oriented to person, place, and time. She appears well-developed and well-nourished. No distress.  HENT:  Head: Normocephalic and  atraumatic.  Nose: Nose normal.  Eyes: Right eye exhibits no discharge. Left eye exhibits no discharge.  Neck: Normal range of motion. Neck supple.  Cardiovascular: Normal rate and regular rhythm.  No murmur heard. Pulmonary/Chest: Effort normal and breath sounds normal.  Abdominal: Soft. Bowel sounds are normal. There is no tenderness.  Musculoskeletal: She exhibits no edema.  Neurological: She is alert and oriented to person, place, and time.  Skin: Skin is warm and dry.  Psychiatric: She has a normal mood and affect.  Nursing note and vitals reviewed.   BP (!) 117/57 (BP Location: Left Arm, Patient Position: Sitting, Cuff Size: Normal)   Pulse (!) 54   Temp 98.1 F (36.7 C) (Oral)   Resp 18   Ht 4\' 9"  (1.448 m)   Wt 123 lb 12.8 oz (56.2 kg)   SpO2 98%   BMI 26.79 kg/m  Wt Readings from Last 3 Encounters:  03/23/18 123 lb 12.8 oz (56.2 kg)  03/17/18 123 lb (55.8 kg)  02/22/18 124 lb 2 oz (56.3 kg)     Lab Results  Component Value Date   WBC 5.6 03/23/2018   HGB 11.6 (L) 03/23/2018   HCT 34.1 (L) 03/23/2018   PLT 204.0 03/23/2018   GLUCOSE 72 03/23/2018   CHOL 239 (H) 01/17/2018   TRIG 157.0 (H) 01/17/2018   HDL 58.40 01/17/2018   LDLCALC 149 (H) 01/17/2018   ALT 16 03/23/2018   AST 23 03/23/2018   NA 136 03/23/2018   K 4.7 03/23/2018   CL 103 03/23/2018   CREATININE 1.14 03/23/2018   BUN 34 (H) 03/23/2018   CO2 27 03/23/2018   TSH 1.24 01/17/2018   HGBA1C 6.0 12/22/2017    Lab Results  Component Value Date   TSH 1.24 01/17/2018   Lab Results  Component Value Date   WBC 5.6  03/23/2018   HGB 11.6 (L) 03/23/2018   HCT 34.1 (L) 03/23/2018   MCV 89.4 03/23/2018   PLT 204.0 03/23/2018   Lab Results  Component Value Date   NA 136 03/23/2018   K 4.7 03/23/2018   CO2 27 03/23/2018   GLUCOSE 72 03/23/2018   BUN 34 (H) 03/23/2018   CREATININE 1.14 03/23/2018   BILITOT 0.6 03/23/2018   ALKPHOS 47 03/23/2018   AST 23 03/23/2018   ALT 16 03/23/2018   PROT 7.5 03/23/2018   ALBUMIN 4.4 03/23/2018   CALCIUM 9.5 03/23/2018   ANIONGAP 6 01/01/2018   GFR 49.21 (L) 03/23/2018   Lab Results  Component Value Date   CHOL 239 (H) 01/17/2018   Lab Results  Component Value Date   HDL 58.40 01/17/2018   Lab Results  Component Value Date   LDLCALC 149 (H) 01/17/2018   Lab Results  Component Value Date   TRIG 157.0 (H) 01/17/2018   Lab Results  Component Value Date   CHOLHDL 4 01/17/2018   Lab Results  Component Value Date   HGBA1C 6.0 12/22/2017       Assessment & Plan:   Problem List Items Addressed This Visit    Essential hypertension    Well controlled, no changes to meds. Encouraged heart healthy diet such as the DASH diet and exercise as tolerated. Has been low at home recently and she feels weak. Is encouraged to increase hydration and not skip meals. Increase protein intake report if worsens      Hypothyroid    On Levothyroxine, continue to monitor      IBS (irritable bowel syndrome)  GI symptoms improved. Has stopped her pobiotic and continues to improve      Low back pain - Primary    Encouraged moist heat and gentle stretching as tolerated. May try NSAIDs and prescription meds as directed and report if symptoms worsen or seek immediate care. Worsening with left LE pain willl proceed with imaging.       Relevant Orders   DG Lumbar Spine Complete (Completed)   Vitamin D deficiency    Supplement and monitor      Hyperglycemia    minimize simple carbs. Increase exercise as tolerated. Continue current meds      Abdominal pain    Relevant Orders   Comprehensive metabolic panel (Completed)   CBC w/Diff (Completed)   Elevated sed rate   Relevant Orders   Sedimentation rate (Completed)    Other Visit Diagnoses    Pain in left tibia       Relevant Orders   DG Tibia/Fibula Left (Completed)      I am having Jamison Neighbor maintain her Multiple Vitamins-Minerals (MULTIVITAMIN ADULT PO), Collagen-Vitamin C (COLLAGEN PLUS VITAMIN C PO), aspirin, beclomethasone, hydrocortisone, alendronate, levothyroxine, metoprolol succinate, lisinopril, citalopram, omeprazole, and Probiotic Product (PROBIOTIC DAILY PO).  No orders of the defined types were placed in this encounter.    Penni Homans, MD

## 2018-03-26 NOTE — Assessment & Plan Note (Signed)
GI symptoms improved. Has stopped her pobiotic and continues to improve

## 2018-03-26 NOTE — Assessment & Plan Note (Signed)
Encouraged moist heat and gentle stretching as tolerated. May try NSAIDs and prescription meds as directed and report if symptoms worsen or seek immediate care. Worsening with left LE pain willl proceed with imaging.

## 2018-03-26 NOTE — Assessment & Plan Note (Signed)
Supplement and monitor 

## 2018-03-26 NOTE — Assessment & Plan Note (Signed)
Well controlled, no changes to meds. Encouraged heart healthy diet such as the DASH diet and exercise as tolerated. Has been low at home recently and she feels weak. Is encouraged to increase hydration and not skip meals. Increase protein intake report if worsens

## 2018-03-26 NOTE — Assessment & Plan Note (Signed)
On Levothyroxine, continue to monitor 

## 2018-03-26 NOTE — Assessment & Plan Note (Signed)
minimize simple carbs. Increase exercise as tolerated. Continue current meds  

## 2018-03-30 ENCOUNTER — Encounter: Payer: Self-pay | Admitting: Gastroenterology

## 2018-03-30 DIAGNOSIS — M545 Low back pain: Secondary | ICD-10-CM | POA: Diagnosis not present

## 2018-03-30 DIAGNOSIS — M62830 Muscle spasm of back: Secondary | ICD-10-CM | POA: Diagnosis not present

## 2018-03-30 DIAGNOSIS — M9903 Segmental and somatic dysfunction of lumbar region: Secondary | ICD-10-CM | POA: Diagnosis not present

## 2018-03-30 DIAGNOSIS — M9901 Segmental and somatic dysfunction of cervical region: Secondary | ICD-10-CM | POA: Diagnosis not present

## 2018-04-08 ENCOUNTER — Ambulatory Visit (HOSPITAL_BASED_OUTPATIENT_CLINIC_OR_DEPARTMENT_OTHER)
Admission: RE | Admit: 2018-04-08 | Discharge: 2018-04-08 | Disposition: A | Discharge status: Home / Self Care | Payer: Medicare HMO | Source: Ambulatory Visit | Attending: Family Medicine | Admitting: Family Medicine

## 2018-04-08 DIAGNOSIS — M4807 Spinal stenosis, lumbosacral region: Secondary | ICD-10-CM | POA: Diagnosis not present

## 2018-04-08 DIAGNOSIS — M545 Low back pain, unspecified: Secondary | ICD-10-CM

## 2018-04-08 DIAGNOSIS — M48061 Spinal stenosis, lumbar region without neurogenic claudication: Secondary | ICD-10-CM | POA: Diagnosis not present

## 2018-04-08 DIAGNOSIS — M79606 Pain in leg, unspecified: Secondary | ICD-10-CM

## 2018-04-13 DIAGNOSIS — M9903 Segmental and somatic dysfunction of lumbar region: Secondary | ICD-10-CM | POA: Diagnosis not present

## 2018-04-13 DIAGNOSIS — M9901 Segmental and somatic dysfunction of cervical region: Secondary | ICD-10-CM | POA: Diagnosis not present

## 2018-04-13 DIAGNOSIS — M545 Low back pain: Secondary | ICD-10-CM | POA: Diagnosis not present

## 2018-04-13 DIAGNOSIS — M62830 Muscle spasm of back: Secondary | ICD-10-CM | POA: Diagnosis not present

## 2018-04-18 DIAGNOSIS — Z961 Presence of intraocular lens: Secondary | ICD-10-CM

## 2018-04-18 DIAGNOSIS — H25011 Cortical age-related cataract, right eye: Secondary | ICD-10-CM | POA: Insufficient documentation

## 2018-04-18 DIAGNOSIS — H2511 Age-related nuclear cataract, right eye: Secondary | ICD-10-CM | POA: Insufficient documentation

## 2018-04-18 HISTORY — DX: Age-related nuclear cataract, right eye: H25.11

## 2018-04-18 HISTORY — DX: Presence of intraocular lens: Z96.1

## 2018-04-18 HISTORY — DX: Cortical age-related cataract, right eye: H25.011

## 2018-04-19 ENCOUNTER — Telehealth: Payer: Self-pay

## 2018-04-19 DIAGNOSIS — M549 Dorsalgia, unspecified: Secondary | ICD-10-CM

## 2018-04-19 DIAGNOSIS — H53002 Unspecified amblyopia, left eye: Secondary | ICD-10-CM | POA: Insufficient documentation

## 2018-04-19 HISTORY — DX: Unspecified amblyopia, left eye: H53.002

## 2018-04-19 NOTE — Telephone Encounter (Signed)
Referral placed.

## 2018-04-19 NOTE — Telephone Encounter (Signed)
Copied from [place] [id number]. Topic: Referral - Request for Referral >> Apr 17, 2018  2:37 PM Alfredia Ferguson R wrote: Has patient seen PCP for this complaint? Yes Referral for which specialty: Orthopedics Preferred provider/office: None Reason for referral: lower back pain

## 2018-05-04 ENCOUNTER — Encounter: Payer: Self-pay | Admitting: Family Medicine

## 2018-05-04 ENCOUNTER — Ambulatory Visit (INDEPENDENT_AMBULATORY_CARE_PROVIDER_SITE_OTHER): Payer: Medicare HMO | Admitting: Family Medicine

## 2018-05-04 VITALS — BP 120/64 | HR 58 | Temp 97.9°F | Resp 18 | Ht <= 58 in | Wt 124.8 lb

## 2018-05-04 DIAGNOSIS — M79642 Pain in left hand: Secondary | ICD-10-CM | POA: Diagnosis not present

## 2018-05-04 DIAGNOSIS — E039 Hypothyroidism, unspecified: Secondary | ICD-10-CM

## 2018-05-04 DIAGNOSIS — N644 Mastodynia: Secondary | ICD-10-CM

## 2018-05-04 DIAGNOSIS — I10 Essential (primary) hypertension: Secondary | ICD-10-CM | POA: Diagnosis not present

## 2018-05-04 DIAGNOSIS — C50919 Malignant neoplasm of unspecified site of unspecified female breast: Secondary | ICD-10-CM

## 2018-05-04 DIAGNOSIS — M7989 Other specified soft tissue disorders: Secondary | ICD-10-CM

## 2018-05-04 DIAGNOSIS — R251 Tremor, unspecified: Secondary | ICD-10-CM | POA: Diagnosis not present

## 2018-05-04 DIAGNOSIS — E782 Mixed hyperlipidemia: Secondary | ICD-10-CM

## 2018-05-04 DIAGNOSIS — I809 Phlebitis and thrombophlebitis of unspecified site: Secondary | ICD-10-CM

## 2018-05-04 DIAGNOSIS — E559 Vitamin D deficiency, unspecified: Secondary | ICD-10-CM

## 2018-05-04 LAB — CBC
HCT: 34.5 % — ABNORMAL LOW (ref 36.0–46.0)
Hemoglobin: 11.5 g/dL — ABNORMAL LOW (ref 12.0–15.0)
MCHC: 33.3 g/dL (ref 30.0–36.0)
MCV: 89.8 fl (ref 78.0–100.0)
PLATELETS: 220 10*3/uL (ref 150.0–400.0)
RBC: 3.84 Mil/uL — AB (ref 3.87–5.11)
RDW: 13.7 % (ref 11.5–15.5)
WBC: 5.5 10*3/uL (ref 4.0–10.5)

## 2018-05-04 LAB — VITAMIN D 25 HYDROXY (VIT D DEFICIENCY, FRACTURES): VITD: 85.17 ng/mL (ref 30.00–100.00)

## 2018-05-04 LAB — COMPREHENSIVE METABOLIC PANEL
ALBUMIN: 4.2 g/dL (ref 3.5–5.2)
ALK PHOS: 43 U/L (ref 39–117)
ALT: 17 U/L (ref 0–35)
AST: 22 U/L (ref 0–37)
BUN: 40 mg/dL — ABNORMAL HIGH (ref 6–23)
CHLORIDE: 104 meq/L (ref 96–112)
CO2: 25 meq/L (ref 19–32)
Calcium: 9.2 mg/dL (ref 8.4–10.5)
Creatinine, Ser: 1.2 mg/dL (ref 0.40–1.20)
GFR: 46.37 mL/min — ABNORMAL LOW (ref >60.00)
Glucose, Bld: 110 mg/dL — ABNORMAL HIGH (ref 70–99)
Potassium: 4.7 mEq/L (ref 3.5–5.1)
Sodium: 137 mEq/L (ref 135–145)
Total Bilirubin: 0.5 mg/dL (ref 0.2–1.2)
Total Protein: 6.8 g/dL (ref 6.0–8.3)

## 2018-05-04 LAB — LIPID PANEL
CHOLESTEROL: 237 mg/dL — AB (ref 0–200)
HDL: 61 mg/dL (ref >39.00)
LDL Cholesterol: 138 mg/dL — ABNORMAL HIGH (ref 0–99)
NONHDL: 176.21
Total CHOL/HDL Ratio: 4
Triglycerides: 189 mg/dL — ABNORMAL HIGH (ref 0.0–149.0)
VLDL: 37.8 mg/dL (ref 0.0–40.0)

## 2018-05-04 LAB — TSH: TSH: 1.65 u[IU]/mL (ref 0.35–4.50)

## 2018-05-04 MED ORDER — LISINOPRIL 20 MG PO TABS: 20.0000 mg | ORAL_TABLET | Freq: Every day | ORAL | 3 refills | Status: DC

## 2018-05-04 NOTE — Patient Instructions (Addendum)
Shingrix is the new shingles shot 2 shots over 2-6 months at the pharmacy.  Lidocaine gel as needed several times a day.  Tylenol ES 500 mg tabs 1 tab twice daily  Hypertension Hypertension is another name for high blood pressure. High blood pressure forces your heart to work harder to pump blood. This can cause problems over time. There are two numbers in a blood pressure reading. There is a top number (systolic) over a bottom number (diastolic). It is best to have a blood pressure below 120/80. Healthy choices can help lower your blood pressure. You may need medicine to help lower your blood pressure if:  Your blood pressure cannot be lowered with healthy choices.  Your blood pressure is higher than 130/80.  Follow these instructions at home: Eating and drinking  If directed, follow the DASH eating plan. This diet includes: ? Filling half of your plate at each meal with fruits and vegetables. ? Filling one quarter of your plate at each meal with whole grains. Whole grains include whole wheat pasta, brown rice, and whole grain bread. ? Eating or drinking low-fat dairy products, such as skim milk or low-fat yogurt. ? Filling one quarter of your plate at each meal with low-fat (lean) proteins. Low-fat proteins include fish, skinless chicken, eggs, beans, and tofu. ? Avoiding fatty meat, cured and processed meat, or chicken with skin. ? Avoiding premade or processed food.  Eat less than 1,500 mg of salt (sodium) a day.  Limit alcohol use to no more than 1 drink a day for nonpregnant women and 2 drinks a day for men. One drink equals 12 oz of beer, 5 oz of wine, or 1 oz of hard liquor. Lifestyle  Work with your doctor to stay at a healthy weight or to lose weight. Ask your doctor what the best weight is for you.  Get at least 30 minutes of exercise that causes your heart to beat faster (aerobic exercise) most days of the week. This may include walking, swimming, or biking.  Get at least  30 minutes of exercise that strengthens your muscles (resistance exercise) at least 3 days a week. This may include lifting weights or pilates.  Do not use any products that contain nicotine or tobacco. This includes cigarettes and e-cigarettes. If you need help quitting, ask your doctor.  Check your blood pressure at home as told by your doctor.  Keep all follow-up visits as told by your doctor. This is important. Medicines  Take over-the-counter and prescription medicines only as told by your doctor. Follow directions carefully.  Do not skip doses of blood pressure medicine. The medicine does not work as well if you skip doses. Skipping doses also puts you at risk for problems.  Ask your doctor about side effects or reactions to medicines that you should watch for. Contact a doctor if:  You think you are having a reaction to the medicine you are taking.  You have headaches that keep coming back (recurring).  You feel dizzy.  You have swelling in your ankles.  You have trouble with your vision. Get help right away if:  You get a very bad headache.  You start to feel confused.  You feel weak or numb.  You feel faint.  You get very bad pain in your: ? Chest. ? Belly (abdomen).  You throw up (vomit) more than once.  You have trouble breathing. Summary  Hypertension is another name for high blood pressure.  Making healthy choices can help  lower blood pressure. If your blood pressure cannot be controlled with healthy choices, you may need to take medicine. This information is not intended to replace advice given to you by your health care provider. Make sure you discuss any questions you have with your health care provider. Document Released: 10/27/2007 Document Revised: 04/07/2016 Document Reviewed: 04/07/2016 Elsevier Interactive Patient Education  [employer].

## 2018-05-05 ENCOUNTER — Ambulatory Visit (HOSPITAL_BASED_OUTPATIENT_CLINIC_OR_DEPARTMENT_OTHER)
Admission: RE | Admit: 2018-05-05 | Discharge: 2018-05-05 | Disposition: A | Discharge status: Home / Self Care | Payer: Medicare HMO | Source: Ambulatory Visit | Attending: Family Medicine | Admitting: Family Medicine

## 2018-05-05 DIAGNOSIS — M79642 Pain in left hand: Secondary | ICD-10-CM

## 2018-05-05 DIAGNOSIS — M7989 Other specified soft tissue disorders: Secondary | ICD-10-CM | POA: Diagnosis not present

## 2018-05-05 DIAGNOSIS — I808 Phlebitis and thrombophlebitis of other sites: Secondary | ICD-10-CM | POA: Diagnosis not present

## 2018-05-05 DIAGNOSIS — I809 Phlebitis and thrombophlebitis of unspecified site: Secondary | ICD-10-CM

## 2018-05-07 DIAGNOSIS — M79642 Pain in left hand: Secondary | ICD-10-CM | POA: Insufficient documentation

## 2018-05-07 DIAGNOSIS — M25512 Pain in left shoulder: Secondary | ICD-10-CM

## 2018-05-07 DIAGNOSIS — M25519 Pain in unspecified shoulder: Secondary | ICD-10-CM | POA: Insufficient documentation

## 2018-05-07 HISTORY — DX: Pain in left shoulder: M25.512

## 2018-05-07 HISTORY — DX: Pain in left hand: M79.642

## 2018-05-07 NOTE — Progress Notes (Signed)
Subjective:    Patient ID: Julie Wilkerson, female    DOB: [birth year], 77 y.o.   MRN: [id number]  No chief complaint on file.   HPI Patient is in today for follow-up and has numerous concerns.  1 is that of tremors she has had for quite some time is progressively worsening and she is interested in having this evaluated.  Her greater concern though is of swelling in her left arm with pain associated.  She notes the veins in her left hand and up her left arm have become enlarged and inflamed but she is also had swelling all the way up the arm.  She has significant pain with some associated weakness in her left hand as well she does endorse a history of trauma to the left hand some number of years ago.  No redness warmth or swelling is associated. .sbh  Past Medical History:  Diagnosis Date  . Asthma   . Breast cancer ([place])    2002, left, encapsulated, microcalcifications. lumpectomy, radtiation x 30  . Breast cancer in female [facility])   . Change in mole 06/08/2016  . Colitis   . Decreased visual acuity 01/16/2017  . Dyspnea 11/04/2016  . Dysuria 11/04/2016  . Gluten intolerance   . History of chicken pox   . History of Helicobacter pylori infection 08/01/2015  . Hyperlipidemia   . Hypertension   . Hypothyroid   . IBS (irritable bowel syndrome)   . Left leg pain 03/01/2017  . Low back pain 08/10/2015  . Neck pain 03/01/2017  . Osteoporosis   . Personal history of radiation therapy   . RLQ discomfort 03/01/2017  . Stomach cramps   . Urinary tract infection 11/04/2016    Past Surgical History:  Procedure Laterality Date  . ABDOMINAL HYSTERECTOMY     partial  . APPENDECTOMY    . BREAST LUMPECTOMY Left 2002  . CHOLECYSTECTOMY  Age 3 or 30  . COLONOSCOPY  2017  . EYE SURGERY Left    cataract  . FRACTURE SURGERY    . TONSILLECTOMY      Family History  Problem Relation Age of Onset  . Colitis Mother   . Irritable bowel syndrome Mother   . Hypertension Father   . Hyperlipidemia Brother    . Heart attack Brother   . Stomach cancer Paternal Grandmother 61  . Hyperlipidemia Brother   . Heart attack Brother   . Hyperlipidemia Brother   . Heart attack Brother   . Hyperlipidemia Sister   . Leukemia Daughter   . Arthritis Daughter   . Heart disease Daughter        ASD vs VSD  . Colon cancer Neg Hx     Social History   Socioeconomic History  . Marital status: Single    Spouse name: Not on file  . Number of children: Not on file  . Years of education: Not on file  . Highest education level: Not on file  Occupational History  . Occupation: retired    Comment: Pharmacist, hospital, kindergarten  Social Needs  . Financial resource strain: Not on file  . Food insecurity:    Worry: Not on file    Inability: Not on file  . Transportation needs:    Medical: Not on file    Non-medical: Not on file  Tobacco Use  . Smoking status: Never Smoker  . Smokeless tobacco: Never Used  Substance and Sexual Activity  . Alcohol use: No    Alcohol/week: 0.0 standard drinks  .  Drug use: No  . Sexual activity: Never    Comment: lives alone, avoids daiiry and gluten. 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 Physical activity:    Days per week: Not on file    Minutes per session: Not on file  . Stress: Not on file  Relationships  . Social connections:    Talks on phone: Not on file    Gets together: Not on file    Attends religious service: Not on file    Active member of club or organization: Not on file    Attends meetings of clubs or organizations: Not on file    Relationship status: Not on file  . Intimate partner violence:    Fear of current or ex partner: Not on file    Emotionally abused: Not on file    Physically abused: Not on file    Forced sexual activity: Not on file  Other Topics Concern  . Not on file  Social History Narrative  . Not on file    Outpatient Medications Prior to Visit  Medication Sig Dispense Refill  . alendronate (FOSAMAX) 70 MG tablet Take 1 tablet  (70 mg total) by mouth once a week. Take with a full glass of water on an empty stomach. 4 tablet 3  . aspirin 81 MG tablet Take 81 mg by mouth daily.    . beclomethasone (QVAR REDIHALER) 80 MCG/ACT inhaler Inhale 2 puffs into the lungs 2 (two) times daily. 1 Inhaler 0  . citalopram (CELEXA) 20 MG tablet Take 1.5 tablets (30 mg total) by mouth daily. 135 tablet 0  . Collagen-Vitamin C (COLLAGEN PLUS VITAMIN C PO) Take 1 tablet by mouth daily.    . hydrocortisone (ANUSOL-HC) 2.5 % rectal cream Place 1 application rectally 2 (two) times daily. 30 g 0  . levothyroxine (SYNTHROID, LEVOTHROID) 50 MCG tablet Take 1 tablet (50 mcg total) by mouth daily. 90 tablet 1  . metoprolol succinate (TOPROL-XL) 25 MG 24 hr tablet Take 1 tablet (25 mg total) by mouth daily. 30 tablet 11  . Multiple Vitamins-Minerals (MULTIVITAMIN ADULT PO) Take 1 tablet by mouth daily.    Marland Kitchen omeprazole (PRILOSEC) 40 MG capsule Take 1 capsule (40 mg total) by mouth daily. 60 capsule 1  . Probiotic Product (PROBIOTIC DAILY PO) Take by mouth daily.    Marland Kitchen lisinopril (PRINIVIL,ZESTRIL) 20 MG tablet Take 1 tablet (20 mg total) by mouth daily. 30 tablet 3   No facility-administered medications prior to visit.     No Known Allergies  Review of Systems  Constitutional: Positive for malaise/fatigue. Negative for fever.  HENT: Negative for congestion.   Eyes: Negative for blurred vision.  Respiratory: Negative for shortness of breath.   Cardiovascular: Negative for chest pain, palpitations and leg swelling.  Gastrointestinal: Negative for abdominal pain, blood in stool and nausea.  Genitourinary: Positive for frequency. Negative for dysuria.  Musculoskeletal: Positive for joint pain. Negative for falls.  Skin: Negative for rash.  Neurological: Positive for tremors and focal weakness. Negative for dizziness, loss of consciousness and headaches.  Endo/Heme/Allergies: Negative for environmental allergies.  Psychiatric/Behavioral: The  patient is nervous/anxious.        Objective:    Physical Exam  BP 120/64 (BP Location: Left Arm, Patient Position: Sitting, Cuff Size: Normal)   Pulse (!) 58   Temp 97.9 F (36.6 C) (Oral)   Resp 18   Ht 4\' 9"  (1.448 m)   Wt 124 lb 12.8 oz (56.6 kg)   SpO2  95%   BMI 27.01 kg/m  Wt Readings from Last 3 Encounters:  05/04/18 124 lb 12.8 oz (56.6 kg)  03/23/18 123 lb 12.8 oz (56.2 kg)  03/17/18 123 lb (55.8 kg)     Lab Results  Component Value Date   WBC 5.5 05/04/2018   HGB 11.5 (L) 05/04/2018   HCT 34.5 (L) 05/04/2018   PLT 220.0 05/04/2018   GLUCOSE 110 (H) 05/04/2018   CHOL 237 (H) 05/04/2018   TRIG 189.0 (H) 05/04/2018   HDL 61.00 05/04/2018   LDLCALC 138 (H) 05/04/2018   ALT 17 05/04/2018   AST 22 05/04/2018   NA 137 05/04/2018   K 4.7 05/04/2018   CL 104 05/04/2018   CREATININE 1.20 05/04/2018   BUN 40 (H) 05/04/2018   CO2 25 05/04/2018   TSH 1.65 05/04/2018   HGBA1C 6.0 12/22/2017    Lab Results  Component Value Date   TSH 1.65 05/04/2018   Lab Results  Component Value Date   WBC 5.5 05/04/2018   HGB 11.5 (L) 05/04/2018   HCT 34.5 (L) 05/04/2018   MCV 89.8 05/04/2018   PLT 220.0 05/04/2018   Lab Results  Component Value Date   NA 137 05/04/2018   K 4.7 05/04/2018   CO2 25 05/04/2018   GLUCOSE 110 (H) 05/04/2018   BUN 40 (H) 05/04/2018   CREATININE 1.20 05/04/2018   BILITOT 0.5 05/04/2018   ALKPHOS 43 05/04/2018   AST 22 05/04/2018   ALT 17 05/04/2018   PROT 6.8 05/04/2018   ALBUMIN 4.2 05/04/2018   CALCIUM 9.2 05/04/2018   ANIONGAP 6 01/01/2018   GFR 46.37 (L) 05/04/2018   Lab Results  Component Value Date   CHOL 237 (H) 05/04/2018   Lab Results  Component Value Date   HDL 61.00 05/04/2018   Lab Results  Component Value Date   LDLCALC 138 (H) 05/04/2018   Lab Results  Component Value Date   TRIG 189.0 (H) 05/04/2018   Lab Results  Component Value Date   CHOLHDL 4 05/04/2018   Lab Results  Component Value Date    HGBA1C 6.0 12/22/2017       Assessment & Plan:   Problem List Items Addressed This Visit    Breast cancer in female [facility])    Now with increased left breast pain and some swelling noted in left arm. Breast ultrasound ordered      Hyperlipidemia   Relevant Medications   lisinopril (PRINIVIL,ZESTRIL) 20 MG tablet   Other Relevant Orders   Lipid panel (Completed)   Essential hypertension   Relevant Medications   lisinopril (PRINIVIL,ZESTRIL) 20 MG tablet   Other Relevant Orders   CBC (Completed)   Comprehensive metabolic panel (Completed)   TSH (Completed)   Hypothyroid   Tremor    Is worsening and patient is ready for referral to neurology for further consideration      Relevant Orders   Ambulatory referral to Neurology   Vitamin D deficiency   Relevant Orders   VITAMIN D 25 Hydroxy (Vit-D Deficiency, Fractures) (Completed)   Left upper extremity swelling    Ultrasound rules out DVT      Relevant Orders   US Venous Img Upper Uni Left (Completed)   Left hand pain    Xray reveals arthritic changes. Encouraged moist heat and gentle stretching as tolerated. May try NSAIDs and prescription meds as directed and report if symptoms worsen or seek immediate care and consider referral to rheumatology due to findings on xray that  suggest arthritis could be erosive for further consideration      Relevant Orders   DG Hand Complete Left (Completed)   CBC (Completed)    Other Visit Diagnoses    Breast pain, left    -  Primary   Relevant Orders   US BREAST COMPLETE UNI LEFT INC AXILLA   Thrombophlebitis       Relevant Medications   lisinopril (PRINIVIL,ZESTRIL) 20 MG tablet   Other Relevant Orders   US Venous Img Upper Uni Left (Completed)   CBC (Completed)      I am having Jamison Neighbor maintain her Multiple Vitamins-Minerals (MULTIVITAMIN ADULT PO), Collagen-Vitamin C (COLLAGEN PLUS VITAMIN C PO), aspirin, beclomethasone, hydrocortisone, alendronate, levothyroxine, metoprolol  succinate, citalopram, omeprazole, Probiotic Product (PROBIOTIC DAILY PO), and lisinopril.  Meds ordered this encounter  Medications  . lisinopril (PRINIVIL,ZESTRIL) 20 MG tablet    Sig: Take 1 tablet (20 mg total) by mouth daily.    Dispense:  90 tablet    Refill:  3     Penni Homans, MD

## 2018-05-07 NOTE — Assessment & Plan Note (Signed)
Xray reveals arthritic changes. Encouraged moist heat and gentle stretching as tolerated. May try NSAIDs and prescription meds as directed and report if symptoms worsen or seek immediate care and consider referral to rheumatology due to findings on xray that suggest arthritis could be erosive for further consideration

## 2018-05-07 NOTE — Assessment & Plan Note (Signed)
Now with increased left breast pain and some swelling noted in left arm. Breast ultrasound ordered

## 2018-05-07 NOTE — Assessment & Plan Note (Signed)
Ultrasound rules out DVT

## 2018-05-07 NOTE — Assessment & Plan Note (Signed)
Is worsening and patient is ready for referral to neurology for further consideration

## 2018-05-09 ENCOUNTER — Other Ambulatory Visit: Payer: Self-pay | Admitting: Family Medicine

## 2018-05-09 DIAGNOSIS — N644 Mastodynia: Secondary | ICD-10-CM

## 2018-05-10 ENCOUNTER — Other Ambulatory Visit: Payer: Self-pay

## 2018-05-10 ENCOUNTER — Other Ambulatory Visit: Payer: Self-pay | Admitting: Family Medicine

## 2018-05-10 DIAGNOSIS — N644 Mastodynia: Secondary | ICD-10-CM

## 2018-05-12 ENCOUNTER — Ambulatory Visit
Admission: RE | Admit: 2018-05-12 | Discharge: 2018-05-12 | Disposition: A | Discharge status: Home / Self Care | Payer: Medicare HMO | Source: Ambulatory Visit | Attending: Family Medicine | Admitting: Family Medicine

## 2018-05-12 DIAGNOSIS — Z853 Personal history of malignant neoplasm of breast: Secondary | ICD-10-CM | POA: Diagnosis not present

## 2018-05-12 DIAGNOSIS — N644 Mastodynia: Secondary | ICD-10-CM

## 2018-05-12 DIAGNOSIS — R922 Inconclusive mammogram: Secondary | ICD-10-CM | POA: Diagnosis not present

## 2018-05-26 ENCOUNTER — Telehealth: Payer: Self-pay | Admitting: *Deleted

## 2018-05-26 ENCOUNTER — Ambulatory Visit (INDEPENDENT_AMBULATORY_CARE_PROVIDER_SITE_OTHER): Payer: Medicare HMO | Admitting: Medical

## 2018-05-26 ENCOUNTER — Ambulatory Visit (HOSPITAL_BASED_OUTPATIENT_CLINIC_OR_DEPARTMENT_OTHER)
Admission: RE | Admit: 2018-05-26 | Discharge: 2018-05-26 | Disposition: A | Discharge status: Home / Self Care | Payer: Medicare HMO | Source: Ambulatory Visit | Attending: Medical | Admitting: Medical

## 2018-05-26 ENCOUNTER — Encounter: Payer: Self-pay | Admitting: Medical

## 2018-05-26 VITALS — BP 123/80 | HR 47 | Temp 97.7°F | Resp 16 | Ht <= 58 in | Wt 123.4 lb

## 2018-05-26 DIAGNOSIS — R059 Cough, unspecified: Secondary | ICD-10-CM

## 2018-05-26 DIAGNOSIS — J40 Bronchitis, not specified as acute or chronic: Secondary | ICD-10-CM | POA: Diagnosis not present

## 2018-05-26 DIAGNOSIS — R05 Cough: Secondary | ICD-10-CM | POA: Insufficient documentation

## 2018-05-26 DIAGNOSIS — M791 Myalgia, unspecified site: Secondary | ICD-10-CM

## 2018-05-26 DIAGNOSIS — J0100 Acute maxillary sinusitis, unspecified: Secondary | ICD-10-CM | POA: Diagnosis not present

## 2018-05-26 DIAGNOSIS — R062 Wheezing: Secondary | ICD-10-CM

## 2018-05-26 DIAGNOSIS — R0989 Other specified symptoms and signs involving the circulatory and respiratory systems: Secondary | ICD-10-CM | POA: Diagnosis not present

## 2018-05-26 LAB — POC INFLUENZA A&B (BINAX/QUICKVUE)
INFLUENZA B, POC: NEGATIVE
Influenza A, POC: NEGATIVE

## 2018-05-26 MED ORDER — FLUTICASONE PROPIONATE 50 MCG/ACT NA SUSP: 2.0000 | Freq: Every day | NASAL | 1 refills | Status: DC

## 2018-05-26 MED ORDER — HYDROCODONE-HOMATROPINE 5-1.5 MG/5ML PO SYRP: 5.0000 mL | ORAL_SOLUTION | Freq: Four times a day (QID) | ORAL | 0 refills | Status: DC | PRN

## 2018-05-26 MED ORDER — PREDNISONE 10 MG (21) PO TBPK: ORAL_TABLET | ORAL | 0 refills | Status: DC

## 2018-05-26 MED ORDER — DOXYCYCLINE HYCLATE 100 MG PO TABS: 100.0000 mg | ORAL_TABLET | Freq: Two times a day (BID) | ORAL | 0 refills | Status: DC

## 2018-05-26 NOTE — Patient Instructions (Addendum)
You appear to have bronchitis and sinusitis. Rest hydrate and tylenol for fever. I am prescribing cough medicine hycodan, and doxycycline antibiotic. For your nasal congestion rx flonase.  With wheezing recently despite use of qvar and albuterol rx'd 6 days taper dose prednisone.  chest xray today.  Follow up in 7-10 days or as needed

## 2018-05-26 NOTE — Telephone Encounter (Signed)
Spoke with patient and informed her that her Influenza test were negative and that provider had not signed off on her Xray results, but nothing was seen to worry over and as sson as provider signed off someone would be calling back with the final result notes. Informed her to continue given care plan and call us back if not getting better and/or if any high fever, vomiting, diarrhea [signs of dehydration], dizziness with confusion, or abnormal chest pain [not from cough] or proceed to an UC/ED for Tx; caller understood & agreed/SLS 01/03

## 2018-05-26 NOTE — Progress Notes (Signed)
Subjective:    Patient ID: Julie Wilkerson, female    DOB: 14-Aug-1940, 78 y.o.   MRN: [id number]  HPI  Pt in states she has been feeling sick for about 3 weeks. She states for 2 weeks she felt just mild nasal congestion for 2 weeks. Then past week she had a lot wheezing past 4 days. Pt is on qvar and albuterol. She states she is still wheezing. Now coughing a lot. States unable to sleep due to cough.  Pt states last night she used neb treatment but made her shake/tremble. Pt states Monday she had some bodyaches. Pt got flu vaccine this year.  Pt states still coughing less productive than before.     Review of Systems  Constitutional: Negative for chills, fatigue and fever.  HENT: Negative for congestion, ear pain, facial swelling, postnasal drip, rhinorrhea, sinus pressure and sinus pain.   Respiratory: Positive for cough and wheezing. Negative for shortness of breath.   Cardiovascular: Negative for chest pain and palpitations.  Gastrointestinal: Negative for abdominal pain.  Musculoskeletal: Positive for myalgias. Negative for back pain.       Monday and Tuesday.  Neurological: Negative for dizziness, weakness, numbness and headaches.  Psychiatric/Behavioral: Negative for behavioral problems and confusion.   Past Medical History:  Diagnosis Date  . Asthma   . Breast cancer ([place])    2002, left, encapsulated, microcalcifications. lumpectomy, radtiation x 30  . Breast cancer in female [facility])   . Change in mole 06/08/2016  . Colitis   . Decreased visual acuity 01/16/2017  . Dyspnea 11/04/2016  . Dysuria 11/04/2016  . Gluten intolerance   . History of chicken pox   . History of Helicobacter pylori infection 08/01/2015  . Hyperlipidemia   . Hypertension   . Hypothyroid   . IBS (irritable bowel syndrome)   . Left leg pain 03/01/2017  . Low back pain 08/10/2015  . Neck pain 03/01/2017  . Osteoporosis   . Personal history of radiation therapy   . RLQ discomfort 03/01/2017  .  Stomach cramps   . Urinary tract infection 11/04/2016     Social History   Socioeconomic History  . Marital status: Single    Spouse name: Not on file  . Number of children: Not on file  . Years of education: Not on file  . Highest education level: Not on file  Occupational History  . Occupation: retired    Comment: Pharmacist, hospital, kindergarten  Social Needs  . Financial resource strain: Not on file  . Food insecurity:    Worry: Not on file    Inability: Not on file  . Transportation needs:    Medical: Not on file    Non-medical: Not on file  Tobacco Use  . Smoking status: Never Smoker  . Smokeless tobacco: Never Used  Substance and Sexual Activity  . Alcohol use: No    Alcohol/week: 0.0 standard drinks  . Drug use: No  . Sexual activity: Never    Comment: lives alone, avoids daiiry and gluten. 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 Physical activity:    Days per week: Not on file    Minutes per session: Not on file  . Stress: Not on file  Relationships  . Social connections:    Talks on phone: Not on file    Gets together: Not on file    Attends religious service: Not on file    Active member of club or organization: Not on file    Attends  meetings of clubs or organizations: Not on file    Relationship status: Not on file  . Intimate partner violence:    Fear of current or ex partner: Not on file    Emotionally abused: Not on file    Physically abused: Not on file    Forced sexual activity: Not on file  Other Topics Concern  . Not on file  Social History Narrative  . Not on file    Past Surgical History:  Procedure Laterality Date  . ABDOMINAL HYSTERECTOMY     partial  . APPENDECTOMY    . BREAST LUMPECTOMY Left 2002  . CHOLECYSTECTOMY  Age 52 or 35  . COLONOSCOPY  2017  . EYE SURGERY Left    cataract  . FRACTURE SURGERY    . TONSILLECTOMY      Family History  Problem Relation Age of Onset  . Colitis Mother   . Irritable bowel syndrome Mother   .  Hypertension Father   . Hyperlipidemia Brother   . Heart attack Brother   . Stomach cancer Paternal Grandmother 7  . Hyperlipidemia Brother   . Heart attack Brother   . Hyperlipidemia Brother   . Heart attack Brother   . Hyperlipidemia Sister   . Leukemia Daughter   . Arthritis Daughter   . Heart disease Daughter        ASD vs VSD  . Colon cancer Neg Hx     No Known Allergies  Current Outpatient Medications on File Prior to Visit  Medication Sig Dispense Refill  . alendronate (FOSAMAX) 70 MG tablet Take 1 tablet (70 mg total) by mouth once a week. Take with a full glass of water on an empty stomach. 4 tablet 3  . aspirin 81 MG tablet Take 81 mg by mouth daily.    . beclomethasone (QVAR REDIHALER) 80 MCG/ACT inhaler Inhale 2 puffs into the lungs 2 (two) times daily. 1 Inhaler 0  . citalopram (CELEXA) 20 MG tablet Take 1.5 tablets (30 mg total) by mouth daily. 135 tablet 0  . Collagen-Vitamin C (COLLAGEN PLUS VITAMIN C PO) Take 1 tablet by mouth daily.    . hydrocortisone (ANUSOL-HC) 2.5 % rectal cream Place 1 application rectally 2 (two) times daily. 30 g 0  . levothyroxine (SYNTHROID, LEVOTHROID) 50 MCG tablet Take 1 tablet (50 mcg total) by mouth daily. 90 tablet 1  . lisinopril (PRINIVIL,ZESTRIL) 20 MG tablet Take 1 tablet (20 mg total) by mouth daily. 90 tablet 3  . metoprolol succinate (TOPROL-XL) 25 MG 24 hr tablet Take 1 tablet (25 mg total) by mouth daily. 30 tablet 11  . Multiple Vitamins-Minerals (MULTIVITAMIN ADULT PO) Take 1 tablet by mouth daily.    Marland Kitchen omeprazole (PRILOSEC) 40 MG capsule Take 1 capsule (40 mg total) by mouth daily. 60 capsule 1  . Probiotic Product (PROBIOTIC DAILY PO) Take by mouth daily.     No current facility-administered medications on file prior to visit.     BP 123/80   Pulse (!) 47   Temp 97.7 F (36.5 C) (Oral)   Resp 16   Ht 4\' 9"  (1.448 m)   Wt 123 lb 6.4 oz (56 kg)   SpO2 97%   BMI 26.70 kg/m       Objective:   Physical  Exam   General  Mental Status - Alert. General Appearance - Well groomed. Not in acute distress.  Skin Rashes- No Rashes.  HEENT Head- Normal. Ear Auditory Canal - Left- Normal. Right -  Normal.Tympanic Membrane- Left- Normal. Right- Normal. Eye Sclera/Conjunctiva- Left- Normal. Right- Normal. Nose & Sinuses Nasal Mucosa- Left-  Boggy and Congested. Right-  Boggy and  Congested.Bilateral  No maxillary and no  frontal sinus pressure. Mouth & Throat Lips: Upper Lip- Normal: no dryness, cracking, pallor, cyanosis, or vesicular eruption. Lower Lip-Normal: no dryness, cracking, pallor, cyanosis or vesicular eruption. Buccal Mucosa- Bilateral- No Aphthous ulcers. Oropharynx- No Discharge or Erythema. Tonsils: Characteristics- Bilateral- No Erythema or Congestion. Size/Enlargement- Bilateral- No enlargement. Discharge- bilateral-None.  Neck Neck- Supple. No Masses.   Chest and Lung Exam Auscultation: Breath Sounds:-Clear even and unlabored.  Cardiovascular Auscultation:Rythm- Regular, rate and rhythm. Murmurs & Other Heart Sounds:Ausculatation of the heart reveal- No Murmurs.  Lymphatic Head & Neck General Head & Neck Lymphatics: Bilateral: Description- No Localized lymphadenopathy.      Assessment & Plan:   You appear to have bronchitis and sinusitis. Rest hydrate and tylenol for fever. I am prescribing cough medicine hycodan, and doxycycline antibiotic. For your nasal congestion rx flonase.  With wheezing recently despite use of qvar and albuterol rx'd 6 days taper dose prednisone.  chest xray today.  Follow up in 7-10 days or as needed  We did flu test today.(discussed with pt time frame of treatment)Test came back negative.

## 2018-05-29 DIAGNOSIS — M545 Low back pain: Secondary | ICD-10-CM | POA: Diagnosis not present

## 2018-05-29 DIAGNOSIS — M5136 Other intervertebral disc degeneration, lumbar region: Secondary | ICD-10-CM | POA: Diagnosis not present

## 2018-06-02 ENCOUNTER — Ambulatory Visit: Payer: Self-pay

## 2018-06-02 NOTE — Telephone Encounter (Signed)
Pt. Called to report she "feels better, but still has the chest pain" she has had x 3 weeks. States it is from her coughing with the bronchitis.Has 2 days of antibiotics left. No radiation, shortness of breath or sweating.Does feel weak. Asking to see Dr. Charlett Blake or Mr. Harvie Heck today for check up. No availability. Offered another provider - refuses.States she will finish her antibiotics and "see how I feel." Instructed if her symptoms worsen to call back or go to ED.Verbalizes understanding. Reason for Disposition . [1] Chest pain lasting <= 5 minutes AND [2] NO chest pain or cardiac symptoms now(Exceptions: pains lasting a few seconds)  Answer Assessment - Initial Assessment Questions 1. LOCATION: "Where does it hurt?"       Hurts around her ribs in the middle as well 2. RADIATION: "Does the pain go anywhere else?" (e.g., into neck, jaw, arms, back)     No 3. ONSET: "When did the chest pain begin?" (Minutes, hours or days)      3 weeks ago 4. PATTERN "Does the pain come and go, or has it been constant since it started?"  "Does it get worse with exertion?"      Comes and goes 5. DURATION: "How long does it last" (e.g., seconds, minutes, hours)     Sometimes a few minutes 6. SEVERITY: "How bad is the pain?"  (e.g., Scale 1-10; mild, moderate, or severe)    - MILD (1-3): doesn't interfere with normal activities     - MODERATE (4-7): interferes with normal activities or awakens from sleep    - SEVERE (8-10): excruciating pain, unable to do any normal activities       6 7. CARDIAC RISK FACTORS: "Do you have any history of heart problems or risk factors for heart disease?" (e.g., prior heart attack, angina; high blood pressure, diabetes, being overweight, high cholesterol, smoking, or strong family history of heart disease)     No 8. PULMONARY RISK FACTORS: "Do you have any history of lung disease?"  (e.g., blood clots in lung, asthma, emphysema, birth control pills)     Asthma and history of  pneumonia 9. CAUSE: "What do you think is causing the chest pain?"     My cough 10. OTHER SYMPTOMS: "Do you have any other symptoms?" (e.g., dizziness, nausea, vomiting, sweating, fever, difficulty breathing, cough)       Cough 11. PREGNANCY: "Is there any chance you are pregnant?" "When was your last menstrual period?"       No  Protocols used: CHEST PAIN-A-AH

## 2018-06-07 DIAGNOSIS — M545 Low back pain: Secondary | ICD-10-CM | POA: Diagnosis not present

## 2018-06-08 ENCOUNTER — Emergency Department (HOSPITAL_BASED_OUTPATIENT_CLINIC_OR_DEPARTMENT_OTHER)
Admission: EM | Admit: 2018-06-08 | Discharge: 2018-06-08 | Disposition: A | Discharge status: Home / Self Care | Payer: Medicare HMO | Attending: Emergency Medicine | Admitting: Emergency Medicine

## 2018-06-08 ENCOUNTER — Ambulatory Visit (INDEPENDENT_AMBULATORY_CARE_PROVIDER_SITE_OTHER): Payer: Medicare HMO | Admitting: Medical

## 2018-06-08 ENCOUNTER — Encounter: Payer: Self-pay | Admitting: Medical

## 2018-06-08 ENCOUNTER — Other Ambulatory Visit: Payer: Self-pay

## 2018-06-08 ENCOUNTER — Encounter (HOSPITAL_BASED_OUTPATIENT_CLINIC_OR_DEPARTMENT_OTHER): Payer: Self-pay | Admitting: Emergency Medicine

## 2018-06-08 ENCOUNTER — Emergency Department (HOSPITAL_BASED_OUTPATIENT_CLINIC_OR_DEPARTMENT_OTHER): Payer: Medicare HMO

## 2018-06-08 VITALS — BP 116/55 | HR 51 | Temp 97.6°F | Resp 16 | Ht <= 58 in | Wt 126.0 lb

## 2018-06-08 DIAGNOSIS — R55 Syncope and collapse: Secondary | ICD-10-CM

## 2018-06-08 DIAGNOSIS — R0600 Dyspnea, unspecified: Secondary | ICD-10-CM | POA: Diagnosis not present

## 2018-06-08 DIAGNOSIS — R079 Chest pain, unspecified: Secondary | ICD-10-CM

## 2018-06-08 DIAGNOSIS — R0789 Other chest pain: Secondary | ICD-10-CM | POA: Diagnosis not present

## 2018-06-08 DIAGNOSIS — I10 Essential (primary) hypertension: Secondary | ICD-10-CM | POA: Insufficient documentation

## 2018-06-08 DIAGNOSIS — Z79899 Other long term (current) drug therapy: Secondary | ICD-10-CM | POA: Diagnosis not present

## 2018-06-08 DIAGNOSIS — M79602 Pain in left arm: Secondary | ICD-10-CM

## 2018-06-08 DIAGNOSIS — J0100 Acute maxillary sinusitis, unspecified: Secondary | ICD-10-CM

## 2018-06-08 DIAGNOSIS — E039 Hypothyroidism, unspecified: Secondary | ICD-10-CM | POA: Diagnosis not present

## 2018-06-08 DIAGNOSIS — Z8709 Personal history of other diseases of the respiratory system: Secondary | ICD-10-CM | POA: Diagnosis not present

## 2018-06-08 DIAGNOSIS — M791 Myalgia, unspecified site: Secondary | ICD-10-CM

## 2018-06-08 DIAGNOSIS — Z853 Personal history of malignant neoplasm of breast: Secondary | ICD-10-CM | POA: Insufficient documentation

## 2018-06-08 DIAGNOSIS — Z7982 Long term (current) use of aspirin: Secondary | ICD-10-CM | POA: Insufficient documentation

## 2018-06-08 LAB — CBC WITH DIFFERENTIAL/PLATELET
ABS IMMATURE GRANULOCYTES: 0.04 10*3/uL (ref 0.00–0.07)
Basophils Absolute: 0 10*3/uL (ref 0.0–0.1)
Basophils Relative: 0 %
Eosinophils Absolute: 0 10*3/uL (ref 0.0–0.5)
Eosinophils Relative: 0 %
HEMATOCRIT: 31.1 % — AB (ref 36.0–46.0)
HEMOGLOBIN: 10.2 g/dL — AB (ref 12.0–15.0)
Immature Granulocytes: 1 %
Lymphocytes Relative: 28 %
Lymphs Abs: 2 10*3/uL (ref 0.7–4.0)
MCH: 29 pg (ref 26.0–34.0)
MCHC: 32.8 g/dL (ref 30.0–36.0)
MCV: 88.4 fL (ref 80.0–100.0)
Monocytes Absolute: 1 10*3/uL (ref 0.1–1.0)
Monocytes Relative: 14 %
NEUTROS ABS: 4.1 10*3/uL (ref 1.7–7.7)
Neutrophils Relative %: 57 %
Platelets: 143 10*3/uL — ABNORMAL LOW (ref 150–400)
RBC: 3.52 MIL/uL — ABNORMAL LOW (ref 3.87–5.11)
RDW: 14.2 % (ref 11.5–15.5)
WBC: 7.1 10*3/uL (ref 4.0–10.5)
nRBC: 0 % (ref 0.0–0.2)

## 2018-06-08 LAB — TROPONIN I: Troponin I: <0.03 ng/mL (ref <0.03)

## 2018-06-08 LAB — COMPREHENSIVE METABOLIC PANEL
ALT: UNDETERMINED U/L (ref 0–44)
AST: 26 U/L (ref 15–41)
Albumin: UNDETERMINED g/dL (ref 3.5–5.0)
Alkaline Phosphatase: 45 U/L (ref 38–126)
Anion gap: 7 (ref 5–15)
BILIRUBIN TOTAL: UNDETERMINED mg/dL (ref 0.3–1.2)
BUN: UNDETERMINED mg/dL (ref 8–23)
CO2: 18 mmol/L — ABNORMAL LOW (ref 22–32)
CREATININE: UNDETERMINED mg/dL (ref 0.44–1.00)
Calcium: 8.3 mg/dL — ABNORMAL LOW (ref 8.9–10.3)
Chloride: 109 mmol/L (ref 98–111)
Glucose, Bld: 85 mg/dL (ref 70–99)
Potassium: 5.1 mmol/L (ref 3.5–5.1)
Sodium: 134 mmol/L — ABNORMAL LOW (ref 135–145)
Total Protein: UNDETERMINED g/dL (ref 6.5–8.1)

## 2018-06-08 LAB — POC INFLUENZA A&B (BINAX/QUICKVUE)
Influenza A, POC: NEGATIVE
Influenza B, POC: NEGATIVE

## 2018-06-08 MED ORDER — BENZONATATE 100 MG PO CAPS: 100.0000 mg | ORAL_CAPSULE | Freq: Three times a day (TID) | ORAL | 0 refills | Status: DC

## 2018-06-08 MED ORDER — AMOXICILLIN-POT CLAVULANATE 875-125 MG PO TABS: 1.0000 | ORAL_TABLET | Freq: Two times a day (BID) | ORAL | 0 refills | Status: DC

## 2018-06-08 MED FILL — AMOX-CLAV 875-125 MG TABLET: 875-125 | 7 days supply | Qty: 14 | Fill #0

## 2018-06-08 MED FILL — BENZONATATE 100 MG CAP: 100 | 7 days supply | Qty: 21 | Fill #0

## 2018-06-08 NOTE — ED Provider Notes (Signed)
[facility] EMERGENCY DEPARTMENT Provider Note   CSN: [id number] Arrival date & time: 06/08/18  1046     History   Chief Complaint Chief Complaint  Patient presents with  . Chest Pain  . Back Pain    HPI Julie Wilkerson is a 78 y.o. female.  78 yo F with a chief complaint of chest pain.  The patient was seen upstairs for 2 weeks of URI-like symptoms cough congestion and subjective chills.  Worsening over the past couple days.  She traveled to [place] about a month ago and everyone had a similar illness.  She started having chest and back pain this morning.  Had trouble finding a comfortable position.  Described it as sharp going to her back.  Worse with certain positions and movement.  Worse with deep breaths and coughing.  She denies fevers.  Denies vomiting or diarrhea.  Complaining of sinus congestion and headache.  The history is provided by the patient.  Chest Pain  Associated symptoms: back pain, cough, fatigue and headache   Associated symptoms: no abdominal pain, no dizziness, no fever, no nausea, no palpitations, no shortness of breath and no vomiting   Back Pain  Associated symptoms: chest pain and headaches   Associated symptoms: no abdominal pain, no dysuria and no fever   Illness  Severity:  Mild Onset quality:  Sudden Duration:  2 weeks Timing:  Constant Progression:  Worsening Chronicity:  New Associated symptoms: chest pain, congestion, cough, fatigue, headaches and wheezing   Associated symptoms: no abdominal pain, no diarrhea, no ear pain, no fever, no myalgias, no nausea, no rhinorrhea, no shortness of breath and no vomiting     Past Medical History:  Diagnosis Date  . Asthma   . Breast cancer ([place])    2002, left, encapsulated, microcalcifications. lumpectomy, radtiation x 30  . Breast cancer in female [facility])   . Change in mole 06/08/2016  . Colitis   . Decreased visual acuity 01/16/2017  . Dyspnea 11/04/2016  . Dysuria 11/04/2016  .  Gluten intolerance   . History of chicken pox   . History of Helicobacter pylori infection 08/01/2015  . Hyperlipidemia   . Hypertension   . Hypothyroid   . IBS (irritable bowel syndrome)   . Left leg pain 03/01/2017  . Low back pain 08/10/2015  . Neck pain 03/01/2017  . Osteoporosis   . Personal history of radiation therapy   . RLQ discomfort 03/01/2017  . Stomach cramps   . Urinary tract infection 11/04/2016    Patient Active Problem List   Diagnosis Date Noted  . Left upper extremity swelling 05/07/2018  . Left hand pain 05/07/2018  . Change in bowel habits 02/22/2018  . Nausea and vomiting 02/22/2018  . Poor appetite 02/22/2018  . Anemia 01/17/2018  . Constipation 01/17/2018  . Urinary frequency 12/22/2017  . Right hip pain 10/23/2017  . Elevated sed rate 10/23/2017  . Influenza due to identified influenza virus 09/04/2017  . Bronchitis 04/26/2017  . Acute pansinusitis 04/26/2017  . Memory loss 03/28/2017  . Bilateral carotid bruits 03/28/2017  . Left leg pain 03/01/2017  . Neck pain 03/01/2017  . RLQ discomfort 03/01/2017  . Decreased visual acuity 01/16/2017  . 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 Hyperglycemia 11/07/2016  . 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 Abdominal pain 11/07/2016  . Urinary tract infection 11/04/2016  . Change in mole 06/08/2016  . 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 Vitamin  D deficiency 04/29/2016  . Tremor 02/15/2016  . Low back pain 08/10/2015  . History of Helicobacter pylori infection 08/01/2015  . Cough variant asthma  vs UACS from ACEi    . Breast cancer in female [facility])   . Hyperlipidemia   . Essential hypertension   . Osteoporosis   . Hypothyroid   . IBS (irritable bowel syndrome)   . Gluten intolerance   . History of chicken pox     Past Surgical History:  Procedure Laterality Date  . ABDOMINAL HYSTERECTOMY     partial  . APPENDECTOMY    . BREAST LUMPECTOMY Left 2002  .  CHOLECYSTECTOMY  Age 82 or 34  . COLONOSCOPY  2017  . EYE SURGERY Left    cataract  . FRACTURE SURGERY    . TONSILLECTOMY       OB History   No obstetric history on file.      Home Medications    Prior to Admission medications   Medication Sig Start Date End Date Taking? Authorizing Provider  alendronate (FOSAMAX) 70 MG tablet Take 1 tablet (70 mg total) by mouth once a week. Take with a full glass of water on an empty stomach. 10/18/17   [name], MD  amoxicillin-clavulanate (AUGMENTIN) 875-125 MG tablet Take 1 tablet by mouth every 12 (twelve) hours. 06/08/18   Deno Etienne, DO  aspirin 81 MG tablet Take 81 mg by mouth daily.    [provider]  beclomethasone (QVAR REDIHALER) 80 MCG/ACT inhaler Inhale 2 puffs into the lungs 2 (two) times daily. 12/02/16   Tanda Rockers, MD  benzonatate (TESSALON) 100 MG capsule Take 1 capsule (100 mg total) by mouth every 8 (eight) hours. 06/08/18   Deno Etienne, DO  ciprofloxacin (CILOXAN) 0.3 % ophthalmic solution ciprofloxacin 0.3 % eye drops  INSTILL 1 DROP THREE TIMES DAILY BEGIN 2 DAYS BEFORE SURGERY    [provider]  citalopram (CELEXA) 20 MG tablet Take 1.5 tablets (30 mg total) by mouth daily. 02/24/18   [name], NP  Collagen-Vitamin C (COLLAGEN PLUS VITAMIN C PO) Take 1 tablet by mouth daily.    [provider]  doxycycline (VIBRA-TABS) 100 MG tablet Take 1 tablet (100 mg total) by mouth 2 (two) times daily. 05/26/18   Saguier, Percell Miller, PA-C  fluticasone (FLONASE) 50 MCG/ACT nasal spray Place 2 sprays into both nostrils daily. 05/26/18   Saguier, Percell Miller, PA-C  HYDROcodone-homatropine (HYCODAN) 5-1.5 MG/5ML syrup Take 5 mLs by mouth every 6 (six) hours as needed for cough. 05/26/18   Saguier, Percell Miller, PA-C  hydrocortisone (ANUSOL-HC) 2.5 % rectal cream Place 1 application rectally 2 (two) times daily. 08/02/17   [name], MD  levothyroxine (SYNTHROID, LEVOTHROID) 50 MCG tablet Take 1 tablet (50 mcg  total) by mouth daily. 12/19/17   Saguier, Percell Miller, PA-C  lisinopril (PRINIVIL,ZESTRIL) 20 MG tablet Take 1 tablet (20 mg total) by mouth daily. 05/04/18   [name], MD  metoprolol succinate (TOPROL-XL) 25 MG 24 hr tablet Take 1 tablet (25 mg total) by mouth daily. 12/19/17   Saguier, Percell Miller, PA-C  Multiple Vitamins-Minerals (MULTIVITAMIN ADULT PO) Take 1 tablet by mouth daily.    [provider]  omeprazole (PRILOSEC) 40 MG capsule Take 1 capsule (40 mg total) by mouth daily. 03/17/18   Armbruster, Carlota Raspberry, MD  ondansetron (ZOFRAN-ODT) 4 MG disintegrating tablet ondansetron 4 mg disintegrating tablet    [provider]  oseltamivir (TAMIFLU) 75 MG capsule oseltamivir 75 mg capsule  [provider]  prednisoLONE acetate (PRED FORTE) 1 % ophthalmic suspension prednisolone acetate 1 % eye drops,suspension  INSTILL 1 DROP IN THE SURGICAL EYE 4 TIMES A DAY STARTING THE DAY AFTER SURGERY AS DIRECTED    [provider]  predniSONE (STERAPRED UNI-PAK 21 TAB) 10 MG (21) TBPK tablet Taper dosing over next 6 days 05/26/18   Saguier, Percell Miller, PA-C  Probiotic Product (PROBIOTIC DAILY PO) Take by mouth daily.    [provider]  sulfamethoxazole-trimethoprim (BACTRIM DS,SEPTRA DS) 800-160 MG tablet sulfamethoxazole 800 mg-trimethoprim 160 mg tablet  TAKE 1 TABLET BY MOUTH EVERY 12 HOURS FOR 3 DAYS    [provider]    Family History Family History  Problem Relation Age of Onset  . Colitis Mother   . Irritable bowel syndrome Mother   . Hypertension Father   . Hyperlipidemia Brother   . Heart attack Brother   . Stomach cancer Paternal Grandmother 31  . Hyperlipidemia Brother   . Heart attack Brother   . Hyperlipidemia Brother   . Heart attack Brother   . Hyperlipidemia Sister   . Leukemia Daughter   . Arthritis Daughter   . Heart disease Daughter        ASD vs VSD  . Colon cancer Neg Hx     Social History Social History   Tobacco Use    . Smoking status: Never Smoker  . Smokeless tobacco: Never Used  Substance Use Topics  . Alcohol use: No    Alcohol/week: 0.0 standard drinks  . Drug use: No     Allergies   Patient has no known allergies.   Review of Systems Review of Systems  Constitutional: Positive for fatigue. Negative for chills and fever.  HENT: Positive for congestion. Negative for ear pain and rhinorrhea.   Eyes: Negative for redness and visual disturbance.  Respiratory: Positive for cough and wheezing. Negative for shortness of breath.   Cardiovascular: Positive for chest pain. Negative for palpitations.  Gastrointestinal: Negative for abdominal pain, diarrhea, nausea and vomiting.  Genitourinary: Negative for dysuria and urgency.  Musculoskeletal: Positive for back pain. Negative for arthralgias and myalgias.  Skin: Negative for pallor and wound.  Neurological: Positive for headaches. Negative for dizziness.     Physical Exam Updated Vital Signs BP 128/64 (BP Location: Right Arm)   Pulse (!) 54   Temp 97.8 F (36.6 C) (Oral)   Resp 18   SpO2 100%   Physical Exam Vitals signs and nursing note reviewed.  Constitutional:      General: She is not in acute distress.    Appearance: She is well-developed. She is not diaphoretic.  HENT:     Head: Normocephalic and atraumatic.     Comments: Swollen turbinates, posterior nasal drip, right maxillary sinus is exquisitely tender to percussion, tm normal bilaterally.   Eyes:     Pupils: Pupils are equal, round, and reactive to light.  Neck:     Musculoskeletal: Normal range of motion and neck supple.  Cardiovascular:     Rate and Rhythm: Normal rate and regular rhythm.     Heart sounds: No murmur. No friction rub. No gallop.   Pulmonary:     Effort: Pulmonary effort is normal.     Breath sounds: No wheezing or rales.  Chest:     Chest wall: Tenderness (tender with palpation about the anterior chest wall, just adjacent to the sternum about ribs  3 through 5.  Reproduces pain) present.  Abdominal:  General: There is no distension.     Palpations: Abdomen is soft.     Tenderness: There is no abdominal tenderness.  Musculoskeletal:        General: No tenderness.  Skin:    General: Skin is warm and dry.  Neurological:     Mental Status: She is alert and oriented to person, place, and time.  Psychiatric:        Behavior: Behavior normal.      ED Treatments / Results  Labs (all labs ordered are listed, but only abnormal results are displayed) Labs Reviewed  CBC WITH DIFFERENTIAL/PLATELET - Abnormal; Notable for the following components:      Result Value   RBC 3.52 (*)    Hemoglobin 10.2 (*)    HCT 31.1 (*)    Platelets 143 (*)    All other components within normal limits  COMPREHENSIVE METABOLIC PANEL - Abnormal; Notable for the following components:   Sodium 134 (*)    CO2 18 (*)    Calcium 8.3 (*)    All other components within normal limits  TROPONIN I  CBC WITH DIFFERENTIAL/PLATELET    EKG EKG Interpretation  Date/Time:  Thursday June 08 2018 10:59:36 EST Ventricular Rate:  52 PR Interval:    QRS Duration: 86 QT Interval:  418 QTC Calculation: 389 R Axis:   17 Text Interpretation:  Sinus rhythm No significant change since last tracing Confirmed by Deno Etienne [phone]) on 06/08/2018 11:23:28 AM   Radiology Dg Chest 2 View  Result Date: 06/08/2018 CLINICAL DATA:  Chest pain, bronchitis for 7 weeks EXAM: CHEST - 2 VIEW COMPARISON:  05/26/2018 FINDINGS: There a linear areas of airspace disease peripherally in the left lung likely reflecting atelectasis or scarring. There is no focal consolidation. There is no pleural effusion or pneumothorax. The heart and mediastinal contours are unremarkable. The osseous structures are unremarkable. IMPRESSION: No active cardiopulmonary disease. Electronically Signed   By: Kathreen Devoid   On: 06/08/2018 11:42    Procedures Procedures (including critical care  time)  Medications Ordered in ED Medications - No data to display   Initial Impression / Assessment and Plan / ED Course  I have reviewed the triage vital signs and the nursing notes.  Pertinent labs & imaging results that were available during my care of the patient were reviewed by me and considered in my medical decision making (see chart for details).     78 yo F with a chief complaint of URI-like symptoms.  Going on for the past couple weeks.  That of improving is worsened over the past few days.  Started having chest pain last night.  Saw her family doctor in the office today and they sent her here for ischemic evaluation.  Patient's chest pain is atypical in nature.  Is reproduced on palpation.  I suspect more likely this is musculoskeletal after she has been coughing for a couple weeks.  She is currently pain-free and has not had pain in the past 6 hours.  I feel a single troponin should be reasonable to rule out ACS.  Chest x-ray viewed by me without focal infiltrate or pneumothorax.  With patient's prolonged course of symptoms and worsening facial pain I will treat with Augmentin for sinusitis.  Patient's lab work unfortunately was deficient and was unable to get enough sample for a metabolic panel.  The patient has a possible acidosis.  I discussed the results with her.  At this point she thinks that she feels  well enough to go home.  She is declining repeat blood draws.  She will follow-up with her family doctor.  2:18 PM:  I have discussed the diagnosis/risks/treatment options with the patient and believe the pt to be eligible for discharge home to follow-up with PCP. We also discussed returning to the ED immediately if new or worsening sx occur. We discussed the sx which are most concerning (e.g., sudden worsening pain, fever, inability to tolerate by mouth) that necessitate immediate return. Medications administered to the patient during their visit and any new prescriptions provided  to the patient are listed below.  Medications given during this visit Medications - No data to display   The patient appears reasonably screen and/or stabilized for discharge and I doubt any other medical condition or other [facility] requiring further screening, evaluation, or treatment in the ED at this time prior to discharge.    Final Clinical Impressions(s) / ED Diagnoses   Final diagnoses:  Acute maxillary sinusitis, recurrence not specified  Atypical chest pain    ED Discharge Orders         Ordered    amoxicillin-clavulanate (AUGMENTIN) 875-125 MG tablet  Every 12 hours     06/08/18 1351    benzonatate (TESSALON) 100 MG capsule  Every 8 hours     06/08/18 Duncan, Brandin Dilday, DO 06/08/18 1418

## 2018-06-08 NOTE — ED Notes (Signed)
C/o chest pain and back pain x 6 weeks,  w sob,  Pain increased w cough  Denies pain at present

## 2018-06-08 NOTE — ED Notes (Signed)
Pt given warm blanket and water.

## 2018-06-08 NOTE — ED Triage Notes (Signed)
Pt reports LT - mid CP and LT side back pain x 3 wks; sts she has just completed tx for bronchitis and has had a terrible cough

## 2018-06-08 NOTE — ED Notes (Signed)
Patient transported to X-ray 

## 2018-06-08 NOTE — Progress Notes (Signed)
Subjective:    Patient ID: Julie Wilkerson, female    DOB: 1940-05-30, 78 y.o.   MRN: [id number]  HPI  Pt in for evaluation.  Day before yesterday she got diffuse body aches. Yesterday pain in body was all over. Pt felt tired. Also when she was walking felt shortness of breath. She has pulse ox monitor and her oxygen was 93%. Pt use neb machine and it helped bringing her o2 % to 96%.   Pt has some left lower rib area pain that is over her breast/chest and toward axillary area and her back. Pt states this morning when taking shower had some numbness and pain in left 4th and 5th digit.  Pt has been feeling tired all week.   She states she felt better from last visit then got sick again.   Pt on Sunday around 1 pm had buzzing sensation in rt ear. Felt transiently dizzy and then passed out. Pt thinks she may have HA then but not now. Also not dizzy presently.   Review of Systems  Constitutional: Positive for chills and fatigue.  Respiratory: Positive for cough, shortness of breath and wheezing. Negative for chest tightness.        Some wheezing in evening.  Cardiovascular: Positive for chest pain. Negative for palpitations.  Gastrointestinal: Negative for abdominal pain.  Musculoskeletal: Positive for myalgias. Negative for back pain.       Arm pain associated with chest pain.  Skin: Negative for rash.  Neurological: Positive for syncope.       See hpi.  Hematological: Negative for adenopathy. Does not bruise/bleed easily.  Psychiatric/Behavioral: Negative for behavioral problems, confusion and sleep disturbance. The patient is not nervous/anxious.    Past Medical History:  Diagnosis Date  . Asthma   . Breast cancer ([place])    2002, left, encapsulated, microcalcifications. lumpectomy, radtiation x 30  . Breast cancer in female [facility])   . Change in mole 06/08/2016  . Colitis   . Decreased visual acuity 01/16/2017  . Dyspnea 11/04/2016  . Dysuria 11/04/2016  . Gluten intolerance     . History of chicken pox   . History of Helicobacter pylori infection 08/01/2015  . Hyperlipidemia   . Hypertension   . Hypothyroid   . IBS (irritable bowel syndrome)   . Left leg pain 03/01/2017  . Low back pain 08/10/2015  . Neck pain 03/01/2017  . Osteoporosis   . Personal history of radiation therapy   . RLQ discomfort 03/01/2017  . Stomach cramps   . Urinary tract infection 11/04/2016     Social History   Socioeconomic History  . Marital status: Single    Spouse name: Not on file  . Number of children: Not on file  . Years of education: Not on file  . Highest education level: Not on file  Occupational History  . Occupation: retired    Comment: Pharmacist, hospital, kindergarten  Social Needs  . Financial resource strain: Not on file  . Food insecurity:    Worry: Not on file    Inability: Not on file  . Transportation needs:    Medical: Not on file    Non-medical: Not on file  Tobacco Use  . Smoking status: Never Smoker  . Smokeless tobacco: Never Used  Substance and Sexual Activity  . Alcohol use: No    Alcohol/week: 0.0 standard drinks  . Drug use: No  . Sexual activity: Never    Comment: lives alone, avoids daiiry and gluten. volunteers with  children  Lifestyle  . Physical activity:    Days per week: Not on file    Minutes per session: Not on file  . Stress: Not on file  Relationships  . Social connections:    Talks on phone: Not on file    Gets together: Not on file    Attends religious service: Not on file    Active member of club or organization: Not on file    Attends meetings of clubs or organizations: Not on file    Relationship status: Not on file  . Intimate partner violence:    Fear of current or ex partner: Not on file    Emotionally abused: Not on file    Physically abused: Not on file    Forced sexual activity: Not on file  Other Topics Concern  . Not on file  Social History Narrative  . Not on file    Past Surgical History:  Procedure Laterality  Date  . ABDOMINAL HYSTERECTOMY     partial  . APPENDECTOMY    . BREAST LUMPECTOMY Left 2002  . CHOLECYSTECTOMY  Age 66 or 27  . COLONOSCOPY  2017  . EYE SURGERY Left    cataract  . FRACTURE SURGERY    . TONSILLECTOMY      Family History  Problem Relation Age of Onset  . Colitis Mother   . Irritable bowel syndrome Mother   . Hypertension Father   . Hyperlipidemia Brother   . Heart attack Brother   . Stomach cancer Paternal Grandmother 24  . Hyperlipidemia Brother   . Heart attack Brother   . Hyperlipidemia Brother   . Heart attack Brother   . Hyperlipidemia Sister   . Leukemia Daughter   . Arthritis Daughter   . Heart disease Daughter        ASD vs VSD  . Colon cancer Neg Hx     No Known Allergies  Current Outpatient Medications on File Prior to Visit  Medication Sig Dispense Refill  . alendronate (FOSAMAX) 70 MG tablet Take 1 tablet (70 mg total) by mouth once a week. Take with a full glass of water on an empty stomach. 4 tablet 3  . aspirin 81 MG tablet Take 81 mg by mouth daily.    . beclomethasone (QVAR REDIHALER) 80 MCG/ACT inhaler Inhale 2 puffs into the lungs 2 (two) times daily. 1 Inhaler 0  . ciprofloxacin (CILOXAN) 0.3 % ophthalmic solution ciprofloxacin 0.3 % eye drops  INSTILL 1 DROP THREE TIMES DAILY BEGIN 2 DAYS BEFORE SURGERY    . citalopram (CELEXA) 20 MG tablet Take 1.5 tablets (30 mg total) by mouth daily. 135 tablet 0  . Collagen-Vitamin C (COLLAGEN PLUS VITAMIN C PO) Take 1 tablet by mouth daily.    Marland Kitchen doxycycline (VIBRA-TABS) 100 MG tablet Take 1 tablet (100 mg total) by mouth 2 (two) times daily. 20 tablet 0  . fluticasone (FLONASE) 50 MCG/ACT nasal spray Place 2 sprays into both nostrils daily. 16 g 1  . HYDROcodone-homatropine (HYCODAN) 5-1.5 MG/5ML syrup Take 5 mLs by mouth every 6 (six) hours as needed for cough. 100 mL 0  . hydrocortisone (ANUSOL-HC) 2.5 % rectal cream Place 1 application rectally 2 (two) times daily. 30 g 0  . levothyroxine  (SYNTHROID, LEVOTHROID) 50 MCG tablet Take 1 tablet (50 mcg total) by mouth daily. 90 tablet 1  . lisinopril (PRINIVIL,ZESTRIL) 20 MG tablet Take 1 tablet (20 mg total) by mouth daily. 90 tablet 3  . metoprolol succinate (  TOPROL-XL) 25 MG 24 hr tablet Take 1 tablet (25 mg total) by mouth daily. 30 tablet 11  . Multiple Vitamins-Minerals (MULTIVITAMIN ADULT PO) Take 1 tablet by mouth daily.    Marland Kitchen omeprazole (PRILOSEC) 40 MG capsule Take 1 capsule (40 mg total) by mouth daily. 60 capsule 1  . ondansetron (ZOFRAN-ODT) 4 MG disintegrating tablet ondansetron 4 mg disintegrating tablet    . oseltamivir (TAMIFLU) 75 MG capsule oseltamivir 75 mg capsule    . prednisoLONE acetate (PRED FORTE) 1 % ophthalmic suspension prednisolone acetate 1 % eye drops,suspension  INSTILL 1 DROP IN THE SURGICAL EYE 4 TIMES A DAY STARTING THE DAY AFTER SURGERY AS DIRECTED    . predniSONE (STERAPRED UNI-PAK 21 TAB) 10 MG (21) TBPK tablet Taper dosing over next 6 days 21 tablet 0  . Probiotic Product (PROBIOTIC DAILY PO) Take by mouth daily.    Marland Kitchen sulfamethoxazole-trimethoprim (BACTRIM DS,SEPTRA DS) 800-160 MG tablet sulfamethoxazole 800 mg-trimethoprim 160 mg tablet  TAKE 1 TABLET BY MOUTH EVERY 12 HOURS FOR 3 DAYS     No current facility-administered medications on file prior to visit.     BP (!) 116/55   Pulse (!) 51   Temp 97.6 F (36.4 C) (Oral)   Resp 16   Ht 4\' 9"  (1.448 m)   Wt 126 lb (57.2 kg)   SpO2 98%   BMI 27.27 kg/m       Objective:   Physical Exam  General  Mental Status - Alert. General Appearance - Well groomed. Not in acute distress.  Skin Rashes- No Rashes.  HEENT Head- Normal. Ear Auditory Canal - Left- Normal. Right - Normal.Tympanic Membrane- Left- Normal. Right- Normal. Eye Sclera/Conjunctiva- Left- Normal. Right- Normal. Nose & Sinuses Nasal Mucosa- Left-  Boggy and Congested. Right-  Boggy and  Congested.Bilateral  No  maxillary and no frontal sinus pressure. Mouth &  Throat Lips: Upper Lip- Normal: no dryness, cracking, pallor, cyanosis, or vesicular eruption. Lower Lip-Normal: no dryness, cracking, pallor, cyanosis or vesicular eruption. Buccal Mucosa- Bilateral- No Aphthous ulcers. Oropharynx- No Discharge or Erythema. Tonsils: Characteristics- Bilateral- No Erythema or Congestion. Size/Enlargement- Bilateral- No enlargement. Discharge- bilateral-None.  Neck Neck- Supple. No Masses.   Chest and Lung Exam Auscultation: Breath Sounds:-Clear even and unlabored.  Cardiovascular Auscultation:Rythm- Regular, rate and rhythm. Murmurs & Other Heart Sounds:Ausculatation of the heart reveal- No Murmurs.  Lymphatic Head & Neck General Head & Neck Lymphatics: Bilateral: Description- No Localized lymphadenopathy.  Anterior chest wall- upper costochondral junction tender painful to palpation.  Neuro- CN III- XII grossly intact.      Assessment & Plan:  You do have onset of viral syndrome type symptoms since yesterday with some dyspnea.  You have history of asthma so it is possible that you have a flare associated with a viral syndrome.  However your flu test was negative.   You also report left-sided chest pain and back pain along with some intermittent left arm pain.  The left arm and hand pain was more significant this morning when your chest was hurting more while in the shower.  This description causes concern and need for further work-up in the emergency department.  You would benefit from chest x-ray to make sure you do not have any pneumonia as well as cardiac protein studies.  Your EEG G today showed sinus bradycardia.  No signs of ischemia.  Also blood work may help reveal possible factors that could be associated with your recent syncopal episode on Sunday.  You might  need IV hydration if dehydrated and possible CT scan of your head.   I did call downstairs and notified the emergency department physician of your current presentation.  Please  notify reception staff that I have already talked with the ED MD and to show them this summary as well.  Follow-up with [name] as determined by ED.  Mackie Pai, PA-C   40 minutes spent with patient.  50% of time spent counseling patient on recent signs and symptoms and need for ED evaluation.  Explained the potential work-up that they might do downstairs.

## 2018-06-08 NOTE — Discharge Instructions (Signed)
Take tylenol 2 pills 4 times a day.  Drink plenty of fluids.  Return for worsening shortness of breath, headache, confusion. Follow up with your family doctor.     

## 2018-06-08 NOTE — Patient Instructions (Addendum)
You do have onset of viral syndrome type symptoms since yesterday with some dyspnea.  You have history of asthma so it is possible that you have a flare associated with a viral syndrome.  However your flu test was negative.   You also report left-sided chest pain and back pain along with some intermittent left arm pain.  The left arm and hand pain was more significant this morning when your chest was hurting more while in the shower.  This description causes concern and need for further work-up in the emergency department.  You would benefit from chest x-ray to make sure you do not have any pneumonia as well as cardiac protein studies.  Your EkG today showed sinus bradycardia.  No signs of ischemia.  Also blood work may help reveal possible factors that could be associated with your recent syncopal episode on Sunday.  You might need IV hydration if dehydrated and possible CT scan of your head.   I did call downstairs and notified the emergency department physician of your current presentation.  Please notify reception staff that I have already talked with the ED MD and to show them this summary as well.  Follow-up with [name] as determined by ED.

## 2018-06-08 NOTE — Progress Notes (Signed)
ekg 

## 2018-06-09 ENCOUNTER — Telehealth: Payer: Self-pay | Admitting: Medical

## 2018-06-09 DIAGNOSIS — D649 Anemia, unspecified: Secondary | ICD-10-CM

## 2018-06-09 DIAGNOSIS — R5383 Other fatigue: Secondary | ICD-10-CM

## 2018-06-09 NOTE — Telephone Encounter (Signed)
Lab is closed today. Reschedule for Tuesday. Appointment with me.

## 2018-06-13 ENCOUNTER — Encounter: Payer: Self-pay | Admitting: Medical

## 2018-06-13 ENCOUNTER — Ambulatory Visit (HOSPITAL_BASED_OUTPATIENT_CLINIC_OR_DEPARTMENT_OTHER)
Admission: RE | Admit: 2018-06-13 | Discharge: 2018-06-13 | Disposition: A | Discharge status: Home / Self Care | Payer: Medicare HMO | Source: Ambulatory Visit | Attending: Medical | Admitting: Medical

## 2018-06-13 ENCOUNTER — Ambulatory Visit (INDEPENDENT_AMBULATORY_CARE_PROVIDER_SITE_OTHER): Payer: Medicare HMO | Admitting: Medical

## 2018-06-13 VITALS — BP 119/53 | HR 52 | Temp 97.6°F | Resp 16 | Ht <= 58 in | Wt 124.0 lb

## 2018-06-13 DIAGNOSIS — R062 Wheezing: Secondary | ICD-10-CM | POA: Diagnosis not present

## 2018-06-13 DIAGNOSIS — J3489 Other specified disorders of nose and nasal sinuses: Secondary | ICD-10-CM

## 2018-06-13 DIAGNOSIS — R059 Cough, unspecified: Secondary | ICD-10-CM

## 2018-06-13 DIAGNOSIS — R05 Cough: Secondary | ICD-10-CM

## 2018-06-13 DIAGNOSIS — J40 Bronchitis, not specified as acute or chronic: Secondary | ICD-10-CM

## 2018-06-13 DIAGNOSIS — R5383 Other fatigue: Secondary | ICD-10-CM | POA: Diagnosis not present

## 2018-06-13 LAB — CBC WITH DIFFERENTIAL/PLATELET
Basophils Absolute: 0 10*3/uL (ref 0.0–0.1)
Basophils Relative: 0.5 % (ref 0.0–3.0)
Eosinophils Absolute: 0 10*3/uL (ref 0.0–0.7)
Eosinophils Relative: 0 % (ref 0.0–5.0)
HCT: 34.6 % — ABNORMAL LOW (ref 36.0–46.0)
Hemoglobin: 11.6 g/dL — ABNORMAL LOW (ref 12.0–15.0)
LYMPHS PCT: 31.3 % (ref 12.0–46.0)
Lymphs Abs: 1.5 10*3/uL (ref 0.7–4.0)
MCHC: 33.5 g/dL (ref 30.0–36.0)
MCV: 89.8 fl (ref 78.0–100.0)
MONO ABS: 0.8 10*3/uL (ref 0.1–1.0)
Monocytes Relative: 16.2 % — ABNORMAL HIGH (ref 3.0–12.0)
Neutro Abs: 2.6 10*3/uL (ref 1.4–7.7)
Neutrophils Relative %: 52 % (ref 43.0–77.0)
PLATELETS: 204 10*3/uL (ref 150.0–400.0)
RBC: 3.85 Mil/uL — ABNORMAL LOW (ref 3.87–5.11)
RDW: 14.6 % (ref 11.5–15.5)
WBC: 4.9 10*3/uL (ref 4.0–10.5)

## 2018-06-13 LAB — COMPREHENSIVE METABOLIC PANEL
ALT: 20 U/L (ref 0–35)
AST: 25 U/L (ref 0–37)
Albumin: 4 g/dL (ref 3.5–5.2)
Alkaline Phosphatase: 50 U/L (ref 39–117)
BUN: 29 mg/dL — ABNORMAL HIGH (ref 6–23)
CO2: 30 mEq/L (ref 19–32)
Calcium: 9.7 mg/dL (ref 8.4–10.5)
Chloride: 102 mEq/L (ref 96–112)
Creatinine, Ser: 1.09 mg/dL (ref 0.40–1.20)
GFR: 48.73 mL/min — ABNORMAL LOW (ref >60.00)
Glucose, Bld: 84 mg/dL (ref 70–99)
POTASSIUM: 5 meq/L (ref 3.5–5.1)
Sodium: 139 mEq/L (ref 135–145)
Total Bilirubin: 0.6 mg/dL (ref 0.2–1.2)
Total Protein: 6.8 g/dL (ref 6.0–8.3)

## 2018-06-13 MED ORDER — PREDNISONE 10 MG (21) PO TBPK: ORAL_TABLET | ORAL | 0 refills | Status: DC

## 2018-06-13 MED ORDER — AZITHROMYCIN 250 MG PO TABS: ORAL_TABLET | ORAL | 0 refills | Status: DC

## 2018-06-13 NOTE — Progress Notes (Signed)
Subjective:    Patient ID: Julie Wilkerson, female    DOB: [birth year], 78 y.o.   MRN: [id number]  HPI  Pt in for follow up.  Pt was evaluated in ED for some at atypical chest pain and left arm pain. She had viral syndrome type symptoms as well. Sent down stairs to ED. Troponin study was negative. Repeat ekg was negative.   She was discharged with dx of sinusitis. Pt was given antibiotic Augmentin for sinus infection. Pt states she had not been taking augmentin.  She was also given benzonatate for cough.  Pt states she had just finished doxycycline so she was reluctant to take augmentin.  Chest xray in the ED did not show any pneumonia.  Pt states she has less cough now. She states still feels weak. Appetite little better. She has some frontal HA.   Pt states now she has some pain in her lower back that radiates around her thorax to left lower rib area. Pain is constant. She has not rash. Pt has been checking. She does not when twist her thorax has pain. She notes some left lower rib area pain since 2002. After breast cancer surgery.  She also has some wheezing/whistling type sounds as well in upper chest. On and off wheezing.  Pt states when she coughs she is bringing up mucus.  In ED labs were incomplete. They could not due full metabolic panel.    Review of Systems  Constitutional: Negative for chills, fatigue and fever.  HENT: Positive for congestion, sinus pressure and sinus pain.   Respiratory: Positive for cough and wheezing. Negative for chest tightness and shortness of breath.   Cardiovascular: Negative for chest pain and palpitations.  Gastrointestinal: Negative for abdominal pain.  Genitourinary: Negative for difficulty urinating, dysuria, frequency and urgency.  Musculoskeletal: Negative for back pain.  Skin: Negative for rash.  Neurological: Negative for dizziness and headaches.  Hematological: Negative for adenopathy. Does not bruise/bleed easily.    Psychiatric/Behavioral: Negative for behavioral problems and confusion.    Past Medical History:  Diagnosis Date  . Asthma   . Breast cancer ([place])    2002, left, encapsulated, microcalcifications. lumpectomy, radtiation x 30  . Breast cancer in female [facility])   . Change in mole 06/08/2016  . Colitis   . Decreased visual acuity 01/16/2017  . Dyspnea 11/04/2016  . Dysuria 11/04/2016  . Gluten intolerance   . History of chicken pox   . History of Helicobacter pylori infection 08/01/2015  . Hyperlipidemia   . Hypertension   . Hypothyroid   . IBS (irritable bowel syndrome)   . Left leg pain 03/01/2017  . Low back pain 08/10/2015  . Neck pain 03/01/2017  . Osteoporosis   . Personal history of radiation therapy   . RLQ discomfort 03/01/2017  . Stomach cramps   . Urinary tract infection 11/04/2016     Social History   Socioeconomic History  . Marital status: Single    Spouse name: Not on file  . Number of children: Not on file  . Years of education: Not on file  . Highest education level: Not on file  Occupational History  . Occupation: retired    Comment: Pharmacist, hospital, kindergarten  Social Needs  . Financial resource strain: Not on file  . Food insecurity:    Worry: Not on file    Inability: Not on file  . Transportation needs:    Medical: Not on file    Non-medical: Not on file  Tobacco Use  . Smoking status: Never Smoker  . Smokeless tobacco: Never Used  Substance and Sexual Activity  . Alcohol use: No    Alcohol/week: 0.0 standard drinks  . Drug use: No  . Sexual activity: Never    Comment: lives alone, avoids daiiry and gluten. 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 Physical activity:    Days per week: Not on file    Minutes per session: Not on file  . Stress: Not on file  Relationships  . Social connections:    Talks on phone: Not on file    Gets together: Not on file    Attends religious service: Not on file    Active member of club or organization: Not on file     Attends meetings of clubs or organizations: Not on file    Relationship status: Not on file  . Intimate partner violence:    Fear of current or ex partner: Not on file    Emotionally abused: Not on file    Physically abused: Not on file    Forced sexual activity: Not on file  Other Topics Concern  . Not on file  Social History Narrative  . Not on file    Past Surgical History:  Procedure Laterality Date  . ABDOMINAL HYSTERECTOMY     partial  . APPENDECTOMY    . BREAST LUMPECTOMY Left 2002  . CHOLECYSTECTOMY  Age 49 or 39  . COLONOSCOPY  2017  . EYE SURGERY Left    cataract  . FRACTURE SURGERY    . TONSILLECTOMY      Family History  Problem Relation Age of Onset  . Colitis Mother   . Irritable bowel syndrome Mother   . Hypertension Father   . Hyperlipidemia Brother   . Heart attack Brother   . Stomach cancer Paternal Grandmother 82  . Hyperlipidemia Brother   . Heart attack Brother   . Hyperlipidemia Brother   . Heart attack Brother   . Hyperlipidemia Sister   . Leukemia Daughter   . Arthritis Daughter   . Heart disease Daughter        ASD vs VSD  . Colon cancer Neg Hx     No Known Allergies  Current Outpatient Medications on File Prior to Visit  Medication Sig Dispense Refill  . amoxicillin-clavulanate (AUGMENTIN) 875-125 MG tablet Take 1 tablet by mouth every 12 (twelve) hours. 14 tablet 0  . aspirin 81 MG tablet Take 81 mg by mouth daily.    . beclomethasone (QVAR REDIHALER) 80 MCG/ACT inhaler Inhale 2 puffs into the lungs 2 (two) times daily. 1 Inhaler 0  . benzonatate (TESSALON) 100 MG capsule Take 1 capsule (100 mg total) by mouth every 8 (eight) hours. 21 capsule 0  . citalopram (CELEXA) 20 MG tablet Take 1.5 tablets (30 mg total) by mouth daily. 135 tablet 0  . Collagen-Vitamin C (COLLAGEN PLUS VITAMIN C PO) Take 1 tablet by mouth daily.    . fluticasone (FLONASE) 50 MCG/ACT nasal spray Place 2 sprays into both nostrils daily. 16 g 1  .  HYDROcodone-homatropine (HYCODAN) 5-1.5 MG/5ML syrup Take 5 mLs by mouth every 6 (six) hours as needed for cough. 100 mL 0  . levothyroxine (SYNTHROID, LEVOTHROID) 50 MCG tablet Take 1 tablet (50 mcg total) by mouth daily. 90 tablet 1  . lisinopril (PRINIVIL,ZESTRIL) 20 MG tablet Take 1 tablet (20 mg total) by mouth daily. 90 tablet 3  . metoprolol succinate (TOPROL-XL) 25 MG 24 hr tablet  Take 1 tablet (25 mg total) by mouth daily. 30 tablet 11  . Multiple Vitamins-Minerals (MULTIVITAMIN ADULT PO) Take 1 tablet by mouth daily.    Marland Kitchen omeprazole (PRILOSEC) 40 MG capsule Take 1 capsule (40 mg total) by mouth daily. 60 capsule 1  . ondansetron (ZOFRAN-ODT) 4 MG disintegrating tablet ondansetron 4 mg disintegrating tablet    . oseltamivir (TAMIFLU) 75 MG capsule oseltamivir 75 mg capsule    . prednisoLONE acetate (PRED FORTE) 1 % ophthalmic suspension prednisolone acetate 1 % eye drops,suspension  INSTILL 1 DROP IN THE SURGICAL EYE 4 TIMES A DAY STARTING THE DAY AFTER SURGERY AS DIRECTED    . predniSONE (STERAPRED UNI-PAK 21 TAB) 10 MG (21) TBPK tablet Taper dosing over next 6 days 21 tablet 0  . Probiotic Product (PROBIOTIC DAILY PO) Take by mouth daily.    Marland Kitchen sulfamethoxazole-trimethoprim (BACTRIM DS,SEPTRA DS) 800-160 MG tablet sulfamethoxazole 800 mg-trimethoprim 160 mg tablet  TAKE 1 TABLET BY MOUTH EVERY 12 HOURS FOR 3 DAYS     No current facility-administered medications on file prior to visit.     BP (!) 119/53   Pulse (!) 52   Temp 97.6 F (36.4 C) (Oral)   Resp 16   Ht 4\' 9"  (1.448 m)   Wt 124 lb (56.2 kg)   SpO2 97%   BMI 26.83 kg/m       Objective:   Physical Exam  General  Mental Status - Alert. General Appearance - Well groomed. Not in acute distress.  Skin Rashes- No Rashes.  HEENT Head- Normal. Ear Auditory Canal - Left- Normal. Right - Normal.Tympanic Membrane- Left- Normal. Right- Normal. Eye Sclera/Conjunctiva- Left- Normal. Right- Normal. Nose & Sinuses  Nasal Mucosa- Left-  Boggy and Congested. Right-  Boggy and  Congested.Bilateral maxillary and frontal sinus pressure. Mouth & Throat Lips: Upper Lip- Normal: no dryness, cracking, pallor, cyanosis, or vesicular eruption. Lower Lip-Normal: no dryness, cracking, pallor, cyanosis or vesicular eruption. Buccal Mucosa- Bilateral- No Aphthous ulcers. Oropharynx- No Discharge or Erythema. Tonsils: Characteristics- Bilateral- No Erythema or Congestion. Size/Enlargement- Bilateral- No enlargement. Discharge- bilateral-None.  Neck Neck- Supple. No Masses.   Chest and Lung Exam Auscultation: Breath Sounds:-Clear even and unlabored.  Cardiovascular Auscultation:Rythm- Regular, rate and rhythm. Murmurs & Other Heart Sounds:Ausculatation of the heart reveal- No Murmurs.  Lymphatic Head & Neck General Head & Neck Lymphatics: Bilateral: Description- No Localized lymphadenopathy.  Posterior thorax- on palpation lower rib area left side mild-moderate tendnerness to palpation.(corresponding lower lung field region). When twists thorax pain in this area that runs toward anterior lower ribs.  Skin- on close inspection. No rash or vesicles.      Assessment & Plan:  You do have some persisting bronchitis symptoms along with persisting sinus pressure.  You did not take the Augmentin at the emergency department gave you.  With your persisting symptoms and a productive cough, I do think is a good idea for you to start a azithromycin antibiotic.  Also with your left side posterior/lower rib region of pain, I do think x-ray would be beneficial.  For cough, continue with benzonatate.  For recent wheezing, I want you to start daily 6day taper dose of prednisone.  Reports of fatigue and partial metabolic panel was done in the ED, I want to repeat CMP today along with getting CBC.  Follow-up in 7 days or as needed.  Mackie Pai, PA-C

## 2018-06-13 NOTE — Patient Instructions (Addendum)
You do have some persisting bronchitis symptoms along with persisting sinus pressure.  You did not take the Augmentin at the emergency department gave you.  With your persisting symptoms and a productive cough, I do think is a good idea for you to start a azithromycin antibiotic.  Also with your left side posterior/lower rib region of pain, I do think x-ray would be beneficial.  For cough, continue with benzonatate.  For recent wheezing, I want you to start daily 6day taper dose of prednisone.  Reports of fatigue and partial metabolic panel was done in the ED, I want to repeat CMP today along with getting CBC.  Follow-up in 7 days or as needed.

## 2018-06-14 DIAGNOSIS — M48061 Spinal stenosis, lumbar region without neurogenic claudication: Secondary | ICD-10-CM | POA: Diagnosis not present

## 2018-06-20 DIAGNOSIS — H04122 Dry eye syndrome of left lacrimal gland: Secondary | ICD-10-CM | POA: Diagnosis not present

## 2018-06-20 DIAGNOSIS — H5203 Hypermetropia, bilateral: Secondary | ICD-10-CM | POA: Diagnosis not present

## 2018-06-23 DIAGNOSIS — H5203 Hypermetropia, bilateral: Secondary | ICD-10-CM | POA: Diagnosis not present

## 2018-06-23 DIAGNOSIS — H524 Presbyopia: Secondary | ICD-10-CM | POA: Diagnosis not present

## 2018-06-23 DIAGNOSIS — H52209 Unspecified astigmatism, unspecified eye: Secondary | ICD-10-CM | POA: Diagnosis not present

## 2018-06-27 ENCOUNTER — Ambulatory Visit (HOSPITAL_BASED_OUTPATIENT_CLINIC_OR_DEPARTMENT_OTHER)
Admission: RE | Admit: 2018-06-27 | Discharge: 2018-06-27 | Disposition: A | Discharge status: Home / Self Care | Payer: Medicare HMO | Source: Ambulatory Visit | Attending: Family Medicine | Admitting: Family Medicine

## 2018-06-27 ENCOUNTER — Ambulatory Visit (INDEPENDENT_AMBULATORY_CARE_PROVIDER_SITE_OTHER): Payer: Medicare HMO | Admitting: Family Medicine

## 2018-06-27 VITALS — BP 112/58 | HR 56 | Temp 97.6°F | Resp 18 | Wt 125.0 lb

## 2018-06-27 DIAGNOSIS — N390 Urinary tract infection, site not specified: Secondary | ICD-10-CM

## 2018-06-27 DIAGNOSIS — R309 Painful micturition, unspecified: Secondary | ICD-10-CM | POA: Diagnosis not present

## 2018-06-27 DIAGNOSIS — E559 Vitamin D deficiency, unspecified: Secondary | ICD-10-CM

## 2018-06-27 DIAGNOSIS — R109 Unspecified abdominal pain: Secondary | ICD-10-CM | POA: Diagnosis not present

## 2018-06-27 DIAGNOSIS — E782 Mixed hyperlipidemia: Secondary | ICD-10-CM

## 2018-06-27 DIAGNOSIS — M810 Age-related osteoporosis without current pathological fracture: Secondary | ICD-10-CM

## 2018-06-27 DIAGNOSIS — I10 Essential (primary) hypertension: Secondary | ICD-10-CM

## 2018-06-27 DIAGNOSIS — M1612 Unilateral primary osteoarthritis, left hip: Secondary | ICD-10-CM | POA: Diagnosis not present

## 2018-06-27 DIAGNOSIS — R739 Hyperglycemia, unspecified: Secondary | ICD-10-CM

## 2018-06-27 DIAGNOSIS — R1032 Left lower quadrant pain: Secondary | ICD-10-CM

## 2018-06-27 DIAGNOSIS — R102 Pelvic and perineal pain: Secondary | ICD-10-CM

## 2018-06-27 LAB — URINALYSIS, ROUTINE W REFLEX MICROSCOPIC
Bilirubin Urine: NEGATIVE
HGB URINE DIPSTICK: NEGATIVE
Ketones, ur: NEGATIVE
Nitrite: POSITIVE — AB
RBC / HPF: NONE SEEN (ref 0–?)
Specific Gravity, Urine: 1.01 (ref 1.000–1.030)
Total Protein, Urine: NEGATIVE
Urine Glucose: NEGATIVE
Urobilinogen, UA: 0.2 (ref 0.0–1.0)
pH: 5.5 (ref 5.0–8.0)

## 2018-06-27 LAB — CBC WITH DIFFERENTIAL/PLATELET
Basophils Absolute: 0.1 10*3/uL (ref 0.0–0.1)
Basophils Relative: 0.8 % (ref 0.0–3.0)
Eosinophils Absolute: 0 10*3/uL (ref 0.0–0.7)
Eosinophils Relative: 0 % (ref 0.0–5.0)
HCT: 35.1 % — ABNORMAL LOW (ref 36.0–46.0)
Hemoglobin: 11.5 g/dL — ABNORMAL LOW (ref 12.0–15.0)
Lymphocytes Relative: 23.6 % (ref 12.0–46.0)
Lymphs Abs: 1.7 10*3/uL (ref 0.7–4.0)
MCHC: 32.9 g/dL (ref 30.0–36.0)
MCV: 89.4 fl (ref 78.0–100.0)
Monocytes Absolute: 1.1 10*3/uL — ABNORMAL HIGH (ref 0.1–1.0)
Monocytes Relative: 15.3 % — ABNORMAL HIGH (ref 3.0–12.0)
Neutro Abs: 4.3 10*3/uL (ref 1.4–7.7)
Neutrophils Relative %: 60.3 % (ref 43.0–77.0)
PLATELETS: 257 10*3/uL (ref 150.0–400.0)
RBC: 3.93 Mil/uL (ref 3.87–5.11)
RDW: 14.9 % (ref 11.5–15.5)
WBC: 7.1 10*3/uL (ref 4.0–10.5)

## 2018-06-27 LAB — COMPREHENSIVE METABOLIC PANEL
ALT: 20 U/L (ref 0–35)
AST: 21 U/L (ref 0–37)
Albumin: 4 g/dL (ref 3.5–5.2)
Alkaline Phosphatase: 48 U/L (ref 39–117)
BUN: 27 mg/dL — ABNORMAL HIGH (ref 6–23)
CO2: 28 meq/L (ref 19–32)
CREATININE: 1.07 mg/dL (ref 0.40–1.20)
Calcium: 9.3 mg/dL (ref 8.4–10.5)
Chloride: 103 mEq/L (ref 96–112)
GFR: 49.78 mL/min — ABNORMAL LOW (ref >60.00)
Glucose, Bld: 93 mg/dL (ref 70–99)
Potassium: 4.3 mEq/L (ref 3.5–5.1)
Sodium: 138 mEq/L (ref 135–145)
Total Bilirubin: 0.6 mg/dL (ref 0.2–1.2)
Total Protein: 6.6 g/dL (ref 6.0–8.3)

## 2018-06-27 MED ORDER — PHENAZOPYRIDINE HCL 200 MG PO TABS: 200.0000 mg | ORAL_TABLET | Freq: Three times a day (TID) | ORAL | 1 refills | Status: DC | PRN

## 2018-06-27 NOTE — Patient Instructions (Signed)
Urinary Tract Infection, Adult A urinary tract infection (UTI) is an infection of any part of the urinary tract. The urinary tract includes:  The kidneys.  The ureters.  The bladder.  The urethra. These organs make, store, and get rid of pee (urine) in the body. What are the causes? This is caused by germs (bacteria) in your genital area. These germs grow and cause swelling (inflammation) of your urinary tract. What increases the risk? You are more likely to develop this condition if:  You have a small, thin tube (catheter) to drain pee.  You cannot control when you pee or poop (incontinence).  You are female, and: ? You use these methods to prevent pregnancy: ? A medicine that kills sperm (spermicide). ? A device that blocks sperm (diaphragm). ? You have low levels of a female hormone (estrogen). ? You are pregnant.  You have genes that add to your risk.  You are sexually active.  You take antibiotic medicines.  You have trouble peeing because of: ? A prostate that is bigger than normal, if you are female. ? A blockage in the part of your body that drains pee from the bladder (urethra). ? A kidney stone. ? A nerve condition that affects your bladder (neurogenic bladder). ? Not getting enough to drink. ? Not peeing often enough.  You have other conditions, such as: ? Diabetes. ? A weak disease-fighting system (immune system). ? Sickle cell disease. ? Gout. ? Injury of the spine. What are the signs or symptoms? Symptoms of this condition include:  Needing to pee right away (urgently).  Peeing often.  Peeing small amounts often.  Pain or burning when peeing.  Blood in the pee.  Pee that smells bad or not like normal.  Trouble peeing.  Pee that is cloudy.  Fluid coming from the vagina, if you are female.  Pain in the belly or lower back. Other symptoms include:  Throwing up (vomiting).  No urge to eat.  Feeling mixed up (confused).  Being tired  and grouchy (irritable).  A fever.  Watery poop (diarrhea). How is this treated? This condition may be treated with:  Antibiotic medicine.  Other medicines.  Drinking enough water. Follow these instructions at home:  Medicines  Take over-the-counter and prescription medicines only as told by your doctor.  If you were prescribed an antibiotic medicine, take it as told by your doctor. Do not stop taking it even if you start to feel better. General instructions  Make sure you: ? Pee until your bladder is empty. ? Do not hold pee for a long time. ? Empty your bladder after sex. ? Wipe from front to back after pooping if you are a female. Use each tissue one time when you wipe.  Drink enough fluid to keep your pee pale yellow.  Keep all follow-up visits as told by your doctor. This is important. Contact a doctor if:  You do not get better after 1-2 days.  Your symptoms go away and then come back. Get help right away if:  You have very bad back pain.  You have very bad pain in your lower belly.  You have a fever.  You are sick to your stomach (nauseous).  You are throwing up. Summary  A urinary tract infection (UTI) is an infection of any part of the urinary tract.  This condition is caused by germs in your genital area.  There are many risk factors for a UTI. These include having a small, thin   tube to drain pee and not being able to control when you pee or poop.  Treatment includes antibiotic medicines for germs.  Drink enough fluid to keep your pee pale yellow. This information is not intended to replace advice given to you by your health care provider. Make sure you discuss any questions you have with your health care provider. Document Released: 10/27/2007 Document Revised: 11/17/2017 Document Reviewed: 11/17/2017 Elsevier Interactive Patient Education  2019 [employer]  

## 2018-06-27 NOTE — Progress Notes (Signed)
Subjective:    Patient ID: Julie Wilkerson, female    DOB: 11-Sep-1940, 78 y.o.   MRN: [id number]  No chief complaint on file.   HPI  Patient is in today for left flank pain radiating to groin. It began 10 days ago and is worse with movement.  Pt admits to pain and burning with urination x3days. She is experiencing significant fatigue and loss of appetite. The pain does not worsen with eating, no F/C, and bowels are moving ok. Her URI has improved significantly and she is no longer having issues with it. Pt also reports some headaches since the beginning of her feeling bad in November of last year. Those haven't gotten worse with recent symptoms. Julie Wilkerson is tired of being sick and ready to feel better. She also reports a history of an extremely traumatic and complicated episiotomy, with severe abscess development after birth. Pt is wondering if this could be contributing to the groin pain she is experiencing.   Patient Care Team: [name], MD as PCP - General (Family Medicine) Lake Bells., MD as Consulting Physician (Gastroenterology) Belva Bertin, Armida Sans as Physician Assistant (Family Medicine)   Past Medical History:  Diagnosis Date  . Asthma   . Breast cancer ([place])    2002, left, encapsulated, microcalcifications. lumpectomy, radtiation x 30  . Breast cancer in female [facility])   . Change in mole 06/08/2016  . Colitis   . Decreased visual acuity 01/16/2017  . Dyspnea 11/04/2016  . Dysuria 11/04/2016  . Gluten intolerance   . History of chicken pox   . History of Helicobacter pylori infection 08/01/2015  . Hyperlipidemia   . Hypertension   . Hypothyroid   . IBS (irritable bowel syndrome)   . Left leg pain 03/01/2017  . Low back pain 08/10/2015  . Neck pain 03/01/2017  . Osteoporosis   . Personal history of radiation therapy   . RLQ discomfort 03/01/2017  . Stomach cramps   . Urinary tract infection 11/04/2016    Past Surgical History:  Procedure Laterality Date  .  ABDOMINAL HYSTERECTOMY     partial  . APPENDECTOMY    . BREAST LUMPECTOMY Left 2002  . CHOLECYSTECTOMY  Age 54 or 28  . COLONOSCOPY  2017  . EYE SURGERY Left    cataract  . FRACTURE SURGERY    . TONSILLECTOMY      Family History  Problem Relation Age of Onset  . Colitis Mother   . Irritable bowel syndrome Mother   . Hypertension Father   . Hyperlipidemia Brother   . Heart attack Brother   . Stomach cancer Paternal Grandmother 50  . Hyperlipidemia Brother   . Heart attack Brother   . Hyperlipidemia Brother   . Heart attack Brother   . Hyperlipidemia Sister   . Leukemia Daughter   . Arthritis Daughter   . Heart disease Daughter        ASD vs VSD  . Colon cancer Neg Hx     Social History   Socioeconomic History  . Marital status: Single    Spouse name: Not on file  . Number of children: Not on file  . Years of education: Not on file  . Highest education level: Not on file  Occupational History  . Occupation: retired    Comment: Pharmacist, hospital, kindergarten  Social Needs  . Financial resource strain: Not on file  . Food insecurity:    Worry: Not on file    Inability: Not  on file  . Transportation needs:    Medical: Not on file    Non-medical: Not on file  Tobacco Use  . Smoking status: Never Smoker  . Smokeless tobacco: Never Used  Substance and Sexual Activity  . Alcohol use: No    Alcohol/week: 0.0 standard drinks  . Drug use: No  . Sexual activity: Never    Comment: lives alone, avoids daiiry and gluten. 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 Physical activity:    Days per week: Not on file    Minutes per session: Not on file  . Stress: Not on file  Relationships  . Social connections:    Talks on phone: Not on file    Gets together: Not on file    Attends religious service: Not on file    Active member of club or organization: Not on file    Attends meetings of clubs or organizations: Not on file    Relationship status: Not on file  . Intimate partner  violence:    Fear of current or ex partner: Not on file    Emotionally abused: Not on file    Physically abused: Not on file    Forced sexual activity: Not on file  Other Topics Concern  . Not on file  Social History Narrative  . Not on file    Outpatient Medications Prior to Visit  Medication Sig Dispense Refill  . amoxicillin-clavulanate (AUGMENTIN) 875-125 MG tablet Take 1 tablet by mouth every 12 (twelve) hours. 14 tablet 0  . aspirin 81 MG tablet Take 81 mg by mouth daily.    Marland Kitchen azithromycin (ZITHROMAX) 250 MG tablet Take 2 tablets by mouth on day 1, followed by 1 tablet by mouth daily for 4 days. 6 tablet 0  . beclomethasone (QVAR REDIHALER) 80 MCG/ACT inhaler Inhale 2 puffs into the lungs 2 (two) times daily. 1 Inhaler 0  . benzonatate (TESSALON) 100 MG capsule Take 1 capsule (100 mg total) by mouth every 8 (eight) hours. 21 capsule 0  . citalopram (CELEXA) 20 MG tablet Take 1.5 tablets (30 mg total) by mouth daily. 135 tablet 0  . Collagen-Vitamin C (COLLAGEN PLUS VITAMIN C PO) Take 1 tablet by mouth daily.    . fluticasone (FLONASE) 50 MCG/ACT nasal spray Place 2 sprays into both nostrils daily. 16 g 1  . HYDROcodone-homatropine (HYCODAN) 5-1.5 MG/5ML syrup Take 5 mLs by mouth every 6 (six) hours as needed for cough. 100 mL 0  . levothyroxine (SYNTHROID, LEVOTHROID) 50 MCG tablet Take 1 tablet (50 mcg total) by mouth daily. 90 tablet 1  . lisinopril (PRINIVIL,ZESTRIL) 20 MG tablet Take 1 tablet (20 mg total) by mouth daily. 90 tablet 3  . metoprolol succinate (TOPROL-XL) 25 MG 24 hr tablet Take 1 tablet (25 mg total) by mouth daily. 30 tablet 11  . Multiple Vitamins-Minerals (MULTIVITAMIN ADULT PO) Take 1 tablet by mouth daily.    Marland Kitchen omeprazole (PRILOSEC) 40 MG capsule Take 1 capsule (40 mg total) by mouth daily. 60 capsule 1  . ondansetron (ZOFRAN-ODT) 4 MG disintegrating tablet ondansetron 4 mg disintegrating tablet    . oseltamivir (TAMIFLU) 75 MG capsule oseltamivir 75 mg  capsule    . prednisoLONE acetate (PRED FORTE) 1 % ophthalmic suspension prednisolone acetate 1 % eye drops,suspension  INSTILL 1 DROP IN THE SURGICAL EYE 4 TIMES A DAY STARTING THE DAY AFTER SURGERY AS DIRECTED    . predniSONE (STERAPRED UNI-PAK 21 TAB) 10 MG (21) TBPK tablet Taper dosing over  next 6 days 21 tablet 0  . Probiotic Product (PROBIOTIC DAILY PO) Take by mouth daily.    Marland Kitchen sulfamethoxazole-trimethoprim (BACTRIM DS,SEPTRA DS) 800-160 MG tablet sulfamethoxazole 800 mg-trimethoprim 160 mg tablet  TAKE 1 TABLET BY MOUTH EVERY 12 HOURS FOR 3 DAYS     No facility-administered medications prior to visit.     No Known Allergies  Review of Systems  Constitutional: Positive for malaise/fatigue. Negative for chills, diaphoresis, fever and weight loss.  HENT: Negative for ear pain and sinus pain.   Respiratory: Negative for shortness of breath.   Cardiovascular: Negative for chest pain and palpitations.  Gastrointestinal: Negative for abdominal pain, constipation and diarrhea.  Genitourinary: Positive for dysuria, flank pain, frequency and urgency.  Musculoskeletal: Positive for joint pain (hip).  Neurological: Positive for headaches. Negative for dizziness.       Objective:     Julie Wilkerson appears her stated age, WDWN and in some distress.  Physical Exam Constitutional:      Appearance: Normal appearance.  HENT:     Head: Normocephalic and atraumatic.     Nose: Nose normal.     Mouth/Throat:     Mouth: Mucous membranes are moist.     Pharynx: Oropharynx is clear.  Neck:     Musculoskeletal: Neck supple.  Cardiovascular:     Rate and Rhythm: Normal rate and regular rhythm.     Heart sounds: Normal heart sounds.  Pulmonary:     Effort: Pulmonary effort is normal.     Breath sounds: Normal breath sounds.  Abdominal:     General: Bowel sounds are normal.     Tenderness: There is abdominal tenderness. There is right CVA tenderness and left CVA tenderness (left sided  tenderness significantly worse).  Musculoskeletal:        General: Tenderness (over the left hip) present.  Neurological:     Mental Status: She is alert and oriented to person, place, and time.  Psychiatric:        Mood and Affect: Mood normal.        Behavior: Behavior normal.   Of note, she also experienced extreme pain in groin/hip area when laying down for the abdominal portion of the exam.  BP (!) 112/58 (BP Location: Left Arm, Patient Position: Sitting, Cuff Size: Normal)   Pulse (!) 56   Temp 97.6 F (36.4 C) (Oral)   Resp 18   Wt 56.7 kg   SpO2 97%   BMI 27.05 kg/m  Wt Readings from Last 3 Encounters:  06/27/18 56.7 kg  06/13/18 56.2 kg  06/08/18 57.2 kg   BP Readings from Last 3 Encounters:  06/27/18 (!) 112/58  06/13/18 (!) 119/53  06/08/18 128/64     Immunization History  Administered Date(s) Administered  . Influenza, High Dose Seasonal PF 03/11/2015, 02/05/2016, 02/05/2017, 03/23/2018  . Influenza,inj,quad, With Preservative 05/23/2014  . Pneumococcal Polysaccharide-23 05/23/2014  . Td 02/05/2016  . Zoster 05/24/2009    Health Maintenance  Topic Date Due  . PNA vac Low Risk Adult (2 of 2 - PCV13) 06/27/2018 (Originally 05/24/2015)  . TETANUS/TDAP  02/04/2026  . INFLUENZA VACCINE  Completed  . DEXA SCAN  Completed    Lab Results  Component Value Date   WBC 4.9 06/13/2018   HGB 11.6 (L) 06/13/2018   HCT 34.6 (L) 06/13/2018   PLT 204.0 06/13/2018   GLUCOSE 84 06/13/2018   CHOL 237 (H) 05/04/2018   TRIG 189.0 (H) 05/04/2018   HDL 61.00 05/04/2018   LDLCALC  138 (H) 05/04/2018   ALT 20 06/13/2018   AST 25 06/13/2018   NA 139 06/13/2018   K 5.0 06/13/2018   CL 102 06/13/2018   CREATININE 1.09 06/13/2018   BUN 29 (H) 06/13/2018   CO2 30 06/13/2018   TSH 1.65 05/04/2018   HGBA1C 6.0 12/22/2017    Lab Results  Component Value Date   TSH 1.65 05/04/2018   Lab Results  Component Value Date   WBC 4.9 06/13/2018   HGB 11.6 (L) 06/13/2018    HCT 34.6 (L) 06/13/2018   MCV 89.8 06/13/2018   PLT 204.0 06/13/2018   Lab Results  Component Value Date   NA 139 06/13/2018   K 5.0 06/13/2018   CO2 30 06/13/2018   GLUCOSE 84 06/13/2018   BUN 29 (H) 06/13/2018   CREATININE 1.09 06/13/2018   BILITOT 0.6 06/13/2018   ALKPHOS 50 06/13/2018   AST 25 06/13/2018   ALT 20 06/13/2018   PROT 6.8 06/13/2018   ALBUMIN 4.0 06/13/2018   CALCIUM 9.7 06/13/2018   ANIONGAP 7 06/08/2018   GFR 48.73 (L) 06/13/2018   Lab Results  Component Value Date   CHOL 237 (H) 05/04/2018   Lab Results  Component Value Date   HDL 61.00 05/04/2018   Lab Results  Component Value Date   LDLCALC 138 (H) 05/04/2018   Lab Results  Component Value Date   TRIG 189.0 (H) 05/04/2018   Lab Results  Component Value Date   CHOLHDL 4 05/04/2018   Lab Results  Component Value Date   HGBA1C 6.0 12/22/2017         Assessment & Plan:   Problems Addressed this Visit  Left-sided flank pain radiating to groin: Flank pain began 10 days prior. The groin pain is now very severe, limiting mobility. Severity of the pain leads [name] to believe there could be something more going on than an uncomplicated UTI. Getting an xray of the left hip as well as a UA and urine culture.  Pain with urination: Pt is experiencing pain and burning with urination x3days along with malodorous urine. This, along with +CVA tenderness, points more towards the diagnosis of UTI. Getting urine culture and UA. Pt recently was sent a Augmentin script for sinus issues in January that she never used, so we asked her to take this starting today as prescribed. Also getting CBC to look for signs of infection and CMP to check kidney and liver function. - orders: UA, urine culture; Pyridium 200mg  TID for pain; CBC, CMP  Severe groin pain: Pt is experiencing severe groin pain with movement. It is severe enough that pt was having trouble laying down and lifting that hip up. She has tenderness to  palpation over the hip and groin area. Ordering xray to check for signs of hip fracture although the pt reports no hx of recent falls or trauma. Very possible she has multiple issues occurring simultaneously. - orders: xray  Hx of traumatic episiotomy: Pt had a very traumatic episiotomy without anesthesia during her first childbirth that led to multiple complications including an abscess that took several months to heal. She also is experiencing pain in the rectum and perineal area. She is wondering if that could be part of the issue. Will refer to gynecology for workup of any residual issues and any underlying structural issues. - orders: ambulatory referral to gynecology  Essential Hypertension: Well controlled on current medications. Encouraged heart healthy diet and exercise.  I am having Julie Harper. Wilkerson maintain  her Multiple Vitamins-Minerals (MULTIVITAMIN ADULT PO), Collagen-Vitamin C (COLLAGEN PLUS VITAMIN C PO), aspirin, beclomethasone, levothyroxine, metoprolol succinate, citalopram, omeprazole, Probiotic Product (PROBIOTIC DAILY PO), lisinopril, HYDROcodone-homatropine, fluticasone, sulfamethoxazole-trimethoprim, prednisoLONE acetate, oseltamivir, ondansetron, amoxicillin-clavulanate, benzonatate, azithromycin, and predniSONE.  No orders of the defined types were placed in this encounter.   Court Joy, Student-PA

## 2018-06-28 ENCOUNTER — Ambulatory Visit: Payer: Self-pay | Admitting: *Deleted

## 2018-06-29 LAB — URINE CULTURE
MICRO NUMBER:: 147863
SPECIMEN QUALITY:: ADEQUATE

## 2018-07-02 ENCOUNTER — Encounter: Payer: Self-pay | Admitting: Family Medicine

## 2018-07-02 DIAGNOSIS — R300 Dysuria: Secondary | ICD-10-CM

## 2018-07-02 DIAGNOSIS — R102 Pelvic and perineal pain: Secondary | ICD-10-CM | POA: Insufficient documentation

## 2018-07-02 HISTORY — DX: Pelvic and perineal pain: R10.2

## 2018-07-02 NOTE — Progress Notes (Signed)
Subjective:    Patient ID: Julie Wilkerson, female    DOB: 08-Sep-1940, 78 y.o.   MRN: [id number]  No chief complaint on file.   HPI Patient is in today for follow up.  She is struggling with increased low back pain as well as some lower abdominal pain, left hip pain and dysuria.  She notes she has pelvic pain that is worse midday and with movement.  Her bowels are moving adequately and she denies any bloody or tarry stool.  No hematuria but she does endorse some flank pain.  She also notes malaise, myalgias and headaches.  No fevers or chills.  Has had some sinus congestion as well but no cough and no sputum production.  Does believe her sinusitis is improving. Denies CP/palp/SOB/fevers/GI c/o. Taking meds as prescribed  Past Medical History:  Diagnosis Date  . Asthma   . Breast cancer ([place])    2002, left, encapsulated, microcalcifications. lumpectomy, radtiation x 30  . Breast cancer in female [facility])   . Change in mole 06/08/2016  . Colitis   . Decreased visual acuity 01/16/2017  . Dyspnea 11/04/2016  . Dysuria 11/04/2016  . Gluten intolerance   . History of chicken pox   . History of Helicobacter pylori infection 08/01/2015  . Hyperlipidemia   . Hypertension   . Hypothyroid   . IBS (irritable bowel syndrome)   . Left leg pain 03/01/2017  . Low back pain 08/10/2015  . Neck pain 03/01/2017  . Osteoporosis   . Personal history of radiation therapy   . RLQ discomfort 03/01/2017  . Stomach cramps   . Urinary tract infection 11/04/2016    Past Surgical History:  Procedure Laterality Date  . ABDOMINAL HYSTERECTOMY     partial  . APPENDECTOMY    . BREAST LUMPECTOMY Left 2002  . CHOLECYSTECTOMY  Age 77 or 88  . COLONOSCOPY  2017  . EYE SURGERY Left    cataract  . FRACTURE SURGERY    . TONSILLECTOMY      Family History  Problem Relation Age of Onset  . Colitis Mother   . Irritable bowel syndrome Mother   . Hypertension Father   . Hyperlipidemia Brother   . Heart attack  Brother   . Stomach cancer Paternal Grandmother 73  . Hyperlipidemia Brother   . Heart attack Brother   . Hyperlipidemia Brother   . Heart attack Brother   . Hyperlipidemia Sister   . Leukemia Daughter   . Arthritis Daughter   . Heart disease Daughter        ASD vs VSD  . Colon cancer Neg Hx     Social History   Socioeconomic History  . Marital status: Single    Spouse name: Not on file  . Number of children: Not on file  . Years of education: Not on file  . Highest education level: Not on file  Occupational History  . Occupation: retired    Comment: Pharmacist, hospital, kindergarten  Social Needs  . Financial resource strain: Not on file  . Food insecurity:    Worry: Not on file    Inability: Not on file  . Transportation needs:    Medical: Not on file    Non-medical: Not on file  Tobacco Use  . Smoking status: Never Smoker  . Smokeless tobacco: Never Used  Substance and Sexual Activity  . Alcohol use: No    Alcohol/week: 0.0 standard drinks  . Drug use: No  . Sexual activity: Never  Comment: lives alone, avoids daiiry and gluten. 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 Physical activity:    Days per week: Not on file    Minutes per session: Not on file  . Stress: Not on file  Relationships  . Social connections:    Talks on phone: Not on file    Gets together: Not on file    Attends religious service: Not on file    Active member of club or organization: Not on file    Attends meetings of clubs or organizations: Not on file    Relationship status: Not on file  . Intimate partner violence:    Fear of current or ex partner: Not on file    Emotionally abused: Not on file    Physically abused: Not on file    Forced sexual activity: Not on file  Other Topics Concern  . Not on file  Social History Narrative  . Not on file    Outpatient Medications Prior to Visit  Medication Sig Dispense Refill  . amoxicillin-clavulanate (AUGMENTIN) 875-125 MG tablet Take 1 tablet  by mouth every 12 (twelve) hours. 14 tablet 0  . aspirin 81 MG tablet Take 81 mg by mouth daily.    Marland Kitchen azithromycin (ZITHROMAX) 250 MG tablet Take 2 tablets by mouth on day 1, followed by 1 tablet by mouth daily for 4 days. 6 tablet 0  . beclomethasone (QVAR REDIHALER) 80 MCG/ACT inhaler Inhale 2 puffs into the lungs 2 (two) times daily. 1 Inhaler 0  . benzonatate (TESSALON) 100 MG capsule Take 1 capsule (100 mg total) by mouth every 8 (eight) hours. 21 capsule 0  . citalopram (CELEXA) 20 MG tablet Take 1.5 tablets (30 mg total) by mouth daily. 135 tablet 0  . Collagen-Vitamin C (COLLAGEN PLUS VITAMIN C PO) Take 1 tablet by mouth daily.    . fluticasone (FLONASE) 50 MCG/ACT nasal spray Place 2 sprays into both nostrils daily. 16 g 1  . HYDROcodone-homatropine (HYCODAN) 5-1.5 MG/5ML syrup Take 5 mLs by mouth every 6 (six) hours as needed for cough. 100 mL 0  . levothyroxine (SYNTHROID, LEVOTHROID) 50 MCG tablet Take 1 tablet (50 mcg total) by mouth daily. 90 tablet 1  . lisinopril (PRINIVIL,ZESTRIL) 20 MG tablet Take 1 tablet (20 mg total) by mouth daily. 90 tablet 3  . metoprolol succinate (TOPROL-XL) 25 MG 24 hr tablet Take 1 tablet (25 mg total) by mouth daily. 30 tablet 11  . Multiple Vitamins-Minerals (MULTIVITAMIN ADULT PO) Take 1 tablet by mouth daily.    Marland Kitchen omeprazole (PRILOSEC) 40 MG capsule Take 1 capsule (40 mg total) by mouth daily. 60 capsule 1  . ondansetron (ZOFRAN-ODT) 4 MG disintegrating tablet ondansetron 4 mg disintegrating tablet    . oseltamivir (TAMIFLU) 75 MG capsule oseltamivir 75 mg capsule    . prednisoLONE acetate (PRED FORTE) 1 % ophthalmic suspension prednisolone acetate 1 % eye drops,suspension  INSTILL 1 DROP IN THE SURGICAL EYE 4 TIMES A DAY STARTING THE DAY AFTER SURGERY AS DIRECTED    . predniSONE (STERAPRED UNI-PAK 21 TAB) 10 MG (21) TBPK tablet Taper dosing over next 6 days 21 tablet 0  . Probiotic Product (PROBIOTIC DAILY PO) Take by mouth daily.    Marland Kitchen  sulfamethoxazole-trimethoprim (BACTRIM DS,SEPTRA DS) 800-160 MG tablet sulfamethoxazole 800 mg-trimethoprim 160 mg tablet  TAKE 1 TABLET BY MOUTH EVERY 12 HOURS FOR 3 DAYS     No facility-administered medications prior to visit.     No Known Allergies  Review  of Systems  Constitutional: Negative for fever and malaise/fatigue.  HENT: Negative for congestion.   Eyes: Negative for blurred vision.  Respiratory: Negative for shortness of breath.   Cardiovascular: Negative for chest pain, palpitations and leg swelling.  Gastrointestinal: Positive for abdominal pain. Negative for blood in stool and nausea.  Genitourinary: Positive for dysuria, flank pain, frequency and urgency. Negative for hematuria.  Musculoskeletal: Negative for falls.  Skin: Negative for rash.  Neurological: Positive for headaches. Negative for dizziness and loss of consciousness.  Endo/Heme/Allergies: Negative for environmental allergies.  Psychiatric/Behavioral: Negative for depression. The patient is not nervous/anxious.        Objective:    Physical Exam Vitals signs and nursing note reviewed.  Constitutional:      General: She is not in acute distress.    Appearance: She is well-developed.  HENT:     Head: Normocephalic and atraumatic.     Nose: Nose normal.  Eyes:     General:        Right eye: No discharge.        Left eye: No discharge.  Neck:     Musculoskeletal: Normal range of motion and neck supple.  Cardiovascular:     Rate and Rhythm: Normal rate and regular rhythm.     Heart sounds: No murmur.  Pulmonary:     Effort: Pulmonary effort is normal.     Breath sounds: Normal breath sounds.  Abdominal:     General: Bowel sounds are normal.     Palpations: Abdomen is soft.     Tenderness: There is no abdominal tenderness.  Skin:    General: Skin is warm and dry.  Neurological:     Mental Status: She is alert and oriented to person, place, and time.     BP (!) 112/58 (BP Location: Left  Arm, Patient Position: Sitting, Cuff Size: Normal)   Pulse (!) 56   Temp 97.6 F (36.4 C) (Oral)   Resp 18   Wt 125 lb (56.7 kg)   SpO2 97%   BMI 27.05 kg/m  Wt Readings from Last 3 Encounters:  06/27/18 125 lb (56.7 kg)  06/13/18 124 lb (56.2 kg)  06/08/18 126 lb (57.2 kg)     Lab Results  Component Value Date   WBC 7.1 06/27/2018   HGB 11.5 (L) 06/27/2018   HCT 35.1 (L) 06/27/2018   PLT 257.0 06/27/2018   GLUCOSE 93 06/27/2018   CHOL 237 (H) 05/04/2018   TRIG 189.0 (H) 05/04/2018   HDL 61.00 05/04/2018   LDLCALC 138 (H) 05/04/2018   ALT 20 06/27/2018   AST 21 06/27/2018   NA 138 06/27/2018   K 4.3 06/27/2018   CL 103 06/27/2018   CREATININE 1.07 06/27/2018   BUN 27 (H) 06/27/2018   CO2 28 06/27/2018   TSH 1.65 05/04/2018   HGBA1C 6.0 12/22/2017    Lab Results  Component Value Date   TSH 1.65 05/04/2018   Lab Results  Component Value Date   WBC 7.1 06/27/2018   HGB 11.5 (L) 06/27/2018   HCT 35.1 (L) 06/27/2018   MCV 89.4 06/27/2018   PLT 257.0 06/27/2018   Lab Results  Component Value Date   NA 138 06/27/2018   K 4.3 06/27/2018   CO2 28 06/27/2018   GLUCOSE 93 06/27/2018   BUN 27 (H) 06/27/2018   CREATININE 1.07 06/27/2018   BILITOT 0.6 06/27/2018   ALKPHOS 48 06/27/2018   AST 21 06/27/2018   ALT 20 06/27/2018  PROT 6.6 06/27/2018   ALBUMIN 4.0 06/27/2018   CALCIUM 9.3 06/27/2018   ANIONGAP 7 06/08/2018   GFR 49.78 (L) 06/27/2018   Lab Results  Component Value Date   CHOL 237 (H) 05/04/2018   Lab Results  Component Value Date   HDL 61.00 05/04/2018   Lab Results  Component Value Date   LDLCALC 138 (H) 05/04/2018   Lab Results  Component Value Date   TRIG 189.0 (H) 05/04/2018   Lab Results  Component Value Date   CHOLHDL 4 05/04/2018   Lab Results  Component Value Date   HGBA1C 6.0 12/22/2017       Assessment & Plan:   Problem List Items Addressed This Visit    Hyperlipidemia   Essential hypertension - Primary     Well controlled, no changes to meds. Encouraged heart healthy diet such as the DASH diet and exercise as tolerated.       Relevant Orders   Comprehensive metabolic panel (Completed)   Osteoporosis    Encouraged to get adequate exercise, calcium and vitamin d intake      Vitamin D deficiency   Urinary tract infection    Klebsiella, treated with Augmentin      Relevant Medications   phenazopyridine (PYRIDIUM) 200 MG tablet   Hyperglycemia    hgba1c acceptable, minimize simple carbs. Increase exercise as tolerated.      Abdominal pain   Relevant Orders   Ambulatory referral to Obstetrics / Gynecology   DG HIP UNILAT WITH PELVIS 2-3 VIEWS LEFT (Completed)   Pelvic pain    Pain worse midday with fatigue. Worse with movement but she is worried about history of traumatic episiotomy. Pt had a very traumatic episiotomy without anesthesia during her first childbirth that led to multiple complications including an abscess that took several months to heal. She also is experiencing pain in the rectum and perineal area. She is wondering if that could be part of the issue. Will refer to gynecology for workup of any residual issues and any underlying structural issues. - orders: ambulatory referral to gynecology       Other Visit Diagnoses    Flank pain       Relevant Orders   Urinalysis   Urine Culture (Completed)   CBC w/Diff (Completed)   Pain with urination       Relevant Orders   Urinalysis   Urine Culture (Completed)      I am having Van Clines. Ode start on phenazopyridine. I am also having her maintain her Multiple Vitamins-Minerals (MULTIVITAMIN ADULT PO), Collagen-Vitamin C (COLLAGEN PLUS VITAMIN C PO), aspirin, beclomethasone, levothyroxine, metoprolol succinate, citalopram, omeprazole, Probiotic Product (PROBIOTIC DAILY PO), lisinopril, HYDROcodone-homatropine, fluticasone, sulfamethoxazole-trimethoprim, prednisoLONE acetate, oseltamivir, ondansetron, amoxicillin-clavulanate,  benzonatate, azithromycin, and predniSONE.  Meds ordered this encounter  Medications  . phenazopyridine (PYRIDIUM) 200 MG tablet    Sig: Take 1 tablet (200 mg total) by mouth 3 (three) times daily as needed for pain.    Dispense:  10 tablet    Refill:  1     Penni Homans, MD

## 2018-07-02 NOTE — Assessment & Plan Note (Signed)
Pain worse midday with fatigue. Worse with movement but she is worried about history of traumatic episiotomy. Pt had a very traumatic episiotomy without anesthesia during her first childbirth that led to multiple complications including an abscess that took several months to heal. She also is experiencing pain in the rectum and perineal area. She is wondering if that could be part of the issue. Will refer to gynecology for workup of any residual issues and any underlying structural issues. - orders: ambulatory referral to gynecology

## 2018-07-02 NOTE — Assessment & Plan Note (Signed)
Klebsiella, treated with Augmentin

## 2018-07-02 NOTE — Assessment & Plan Note (Signed)
Well controlled, no changes to meds. Encouraged heart healthy diet such as the DASH diet and exercise as tolerated.  °

## 2018-07-02 NOTE — Assessment & Plan Note (Signed)
Encouraged to get adequate exercise, calcium and vitamin d intake 

## 2018-07-02 NOTE — Assessment & Plan Note (Signed)
hgba1c acceptable, minimize simple carbs. Increase exercise as tolerated.  

## 2018-07-04 ENCOUNTER — Ambulatory Visit: Payer: Medicare HMO | Admitting: *Deleted

## 2018-07-04 ENCOUNTER — Other Ambulatory Visit (INDEPENDENT_AMBULATORY_CARE_PROVIDER_SITE_OTHER): Payer: Medicare HMO

## 2018-07-04 ENCOUNTER — Telehealth: Payer: Self-pay | Admitting: Family Medicine

## 2018-07-04 DIAGNOSIS — R102 Pelvic and perineal pain: Secondary | ICD-10-CM

## 2018-07-04 DIAGNOSIS — R300 Dysuria: Secondary | ICD-10-CM | POA: Diagnosis not present

## 2018-07-04 DIAGNOSIS — R109 Unspecified abdominal pain: Secondary | ICD-10-CM

## 2018-07-04 LAB — URINALYSIS, ROUTINE W REFLEX MICROSCOPIC
Bilirubin Urine: NEGATIVE
Ketones, ur: NEGATIVE
Nitrite: POSITIVE — AB
Specific Gravity, Urine: 1.02 (ref 1.000–1.030)
Total Protein, Urine: NEGATIVE
Urine Glucose: NEGATIVE
Urobilinogen, UA: 0.2 (ref 0.0–1.0)
pH: 5.5 (ref 5.0–8.0)

## 2018-07-04 NOTE — Telephone Encounter (Signed)
Copied from [place] [id number]. Topic: Quick Communication - See Telephone Encounter >> Jul 04, 2018 10:47 AM Rutherford Nail, NT wrote: CRM for notification. See Telephone encounter for: 07/04/18.  See MyChart message from 07/02/2018.  Patient calling and states that she received a phone call. Advised of the message that was sent to her through [place] from [place]. Patient is okay with getting an MRI. Please advise.

## 2018-07-05 NOTE — Progress Notes (Addendum)
Subjective:   Julie Wilkerson is a 78 y.o. female who presents for Medicare Annual (Subsequent) preventive examination.  Review of Systems: No ROS.  Medicare Wellness Visit. Additional risk factors are reflected in the social history. Cardiac Risk Factors include: advanced age (>71men, >71 women);dyslipidemia;hypertension Sleep patterns: varies. Naps as needed  Home Safety/Smoke Alarms: Feels safe in home. Smoke alarms in place.  Lives alone in 1 story home. Daughter lives close by  Female:     Mammo-  02/15/18  Dexa scan- 06/28/17        CCS- last done 08/05/15     Objective:     Vitals: BP 120/64 (BP Location: Left Arm, Patient Position: Sitting, Cuff Size: Normal)   Pulse 60   Ht 4\' 9"  (1.448 m)   Wt 125 lb 3.2 oz (56.8 kg)   SpO2 96%   BMI 27.09 kg/m   Body mass index is 27.09 kg/m.  Advanced Directives 07/06/2018 06/08/2018 01/01/2018 07/19/2017 06/27/2017 04/29/2016  Does Patient Have a Medical Advance Directive? No No No No No No  Would patient like information on creating a medical advance directive? No - Patient declined - No - Patient declined - No - Patient declined No - Patient declined    Tobacco Social History   Tobacco Use  Smoking Status Never Smoker  Smokeless Tobacco Never Used     Counseling given: Not Answered   Clinical Intake: Pain : No/denies pain     Past Medical History:  Diagnosis Date  . Asthma   . Breast cancer ([place])    2002, left, encapsulated, microcalcifications. lumpectomy, radtiation x 30  . Breast cancer in female [facility])   . Change in mole 06/08/2016  . Colitis   . Decreased visual acuity 01/16/2017  . Dyspnea 11/04/2016  . Dysuria 11/04/2016  . Gluten intolerance   . History of chicken pox   . History of Helicobacter pylori infection 08/01/2015  . Hyperlipidemia   . Hypertension   . Hypothyroid   . IBS (irritable bowel syndrome)   . Left leg pain 03/01/2017  . Low back pain 08/10/2015  . Neck pain 03/01/2017  . Osteoporosis     . Personal history of radiation therapy   . RLQ discomfort 03/01/2017  . Stomach cramps   . Urinary tract infection 11/04/2016   Past Surgical History:  Procedure Laterality Date  . ABDOMINAL HYSTERECTOMY     partial  . APPENDECTOMY    . BREAST LUMPECTOMY Left 2002  . CHOLECYSTECTOMY  Age 15 or 7  . COLONOSCOPY  2017  . EYE SURGERY Left    cataract  . FRACTURE SURGERY    . TONSILLECTOMY     Family History  Problem Relation Age of Onset  . Colitis Mother   . Irritable bowel syndrome Mother   . Hypertension Father   . Hyperlipidemia Brother   . Heart attack Brother   . Stomach cancer Paternal Grandmother 25  . Hyperlipidemia Brother   . Heart attack Brother   . Hyperlipidemia Brother   . Heart attack Brother   . Hyperlipidemia Sister   . Leukemia Daughter   . Arthritis Daughter   . Heart disease Daughter        ASD vs VSD  . Colon cancer Neg Hx    Social History   Socioeconomic History  . Marital status: Single    Spouse name: Not on file  . Number of children: Not on file  . Years of education: Not on file  .  Highest education level: Not on file  Occupational History  . Occupation: retired    Comment: Pharmacist, hospital, kindergarten  Social Needs  . Financial resource strain: Not on file  . Food insecurity:    Worry: Not on file    Inability: Not on file  . Transportation needs:    Medical: Not on file    Non-medical: Not on file  Tobacco Use  . Smoking status: Never Smoker  . Smokeless tobacco: Never Used  Substance and Sexual Activity  . Alcohol use: No    Alcohol/week: 0.0 standard drinks  . Drug use: No  . Sexual activity: Never    Comment: lives alone, avoids daiiry and gluten. 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 Physical activity:    Days per week: Not on file    Minutes per session: Not on file  . Stress: Not on file  Relationships  . Social connections:    Talks on phone: Not on file    Gets together: Not on file    Attends religious service:  Not on file    Active member of club or organization: Not on file    Attends meetings of clubs or organizations: Not on file    Relationship status: Not on file  Other Topics Concern  . Not on file  Social History Narrative  . Not on file    Outpatient Encounter Medications as of 07/06/2018  Medication Sig  . aspirin 81 MG tablet Take 81 mg by mouth daily.  . beclomethasone (QVAR REDIHALER) 80 MCG/ACT inhaler Inhale 2 puffs into the lungs 2 (two) times daily.  . citalopram (CELEXA) 20 MG tablet Take 1.5 tablets (30 mg total) by mouth daily.  . Collagen-Vitamin C (COLLAGEN PLUS VITAMIN C PO) Take 1 tablet by mouth daily.  . fluticasone (FLONASE) 50 MCG/ACT nasal spray Place 2 sprays into both nostrils daily.  Marland Kitchen levothyroxine (SYNTHROID, LEVOTHROID) 50 MCG tablet Take 1 tablet (50 mcg total) by mouth daily.  Marland Kitchen lisinopril (PRINIVIL,ZESTRIL) 20 MG tablet Take 1 tablet (20 mg total) by mouth daily.  . metoprolol succinate (TOPROL-XL) 25 MG 24 hr tablet Take 1 tablet (25 mg total) by mouth daily.  . Multiple Vitamins-Minerals (MULTIVITAMIN ADULT PO) Take 1 tablet by mouth daily.  . phenazopyridine (PYRIDIUM) 200 MG tablet Take 1 tablet (200 mg total) by mouth 3 (three) times daily as needed for pain.  . Probiotic Product (PROBIOTIC DAILY PO) Take by mouth daily.  . benzonatate (TESSALON) 100 MG capsule Take 1 capsule (100 mg total) by mouth every 8 (eight) hours. (Patient not taking: Reported on 07/06/2018)  . HYDROcodone-homatropine (HYCODAN) 5-1.5 MG/5ML syrup Take 5 mLs by mouth every 6 (six) hours as needed for cough. (Patient not taking: Reported on 07/06/2018)  . omeprazole (PRILOSEC) 40 MG capsule Take 1 capsule (40 mg total) by mouth daily. (Patient not taking: Reported on 07/06/2018)  . prednisoLONE acetate (PRED FORTE) 1 % ophthalmic suspension prednisolone acetate 1 % eye drops,suspension  INSTILL 1 DROP IN THE SURGICAL EYE 4 TIMES A DAY STARTING THE DAY AFTER SURGERY AS DIRECTED  .  [DISCONTINUED] amoxicillin-clavulanate (AUGMENTIN) 875-125 MG tablet Take 1 tablet by mouth every 12 (twelve) hours.  . [DISCONTINUED] azithromycin (ZITHROMAX) 250 MG tablet Take 2 tablets by mouth on day 1, followed by 1 tablet by mouth daily for 4 days.  . [DISCONTINUED] ondansetron (ZOFRAN-ODT) 4 MG disintegrating tablet ondansetron 4 mg disintegrating tablet  . [DISCONTINUED] oseltamivir (TAMIFLU) 75 MG capsule oseltamivir 75 mg capsule  . [  DISCONTINUED] predniSONE (STERAPRED UNI-PAK 21 TAB) 10 MG (21) TBPK tablet Taper dosing over next 6 days  . [DISCONTINUED] sulfamethoxazole-trimethoprim (BACTRIM DS,SEPTRA DS) 800-160 MG tablet sulfamethoxazole 800 mg-trimethoprim 160 mg tablet  TAKE 1 TABLET BY MOUTH EVERY 12 HOURS FOR 3 DAYS   No facility-administered encounter medications on file as of 07/06/2018.     Activities of Daily Living In your present state of health, do you have any difficulty performing the following activities: 07/06/2018  Hearing? N  Vision? N  Difficulty concentrating or making decisions? N  Walking or climbing stairs? N  Dressing or bathing? N  Doing errands, shopping? N  Preparing Food and eating ? N  Using the Toilet? N  In the past six months, have you accidently leaked urine? N  Do you have problems with loss of bowel control? N  Managing your Medications? N  Managing your Finances? N  Housekeeping or managing your Housekeeping? N  Some recent data might be hidden    Patient Care Team: [name], MD as PCP - General (Family Medicine) Lake Bells., MD as Consulting Physician (Gastroenterology) Belva Bertin, Armida Sans as Physician Assistant (Family Medicine)    Assessment:   This is a routine wellness examination for Julie Wilkerson. Physical assessment deferred to PCP.  Exercise Activities and Dietary recommendations Current Exercise Habits: The patient does not participate in regular exercise at present Diet (meal preparation, eat out, water intake,  caffeinated beverages, dairy products, fruits and vegetables): in general, a "healthy" diet  , well balanced   Goals    . Maintain healthy lifestyle    . Restart exercising 4x/ week.       Fall Risk Fall Risk  07/06/2018 06/27/2017 04/29/2016 03/12/2016 02/05/2016  Falls in the past year? 0 No No No No     Depression Screen PHQ 2/9 Scores 07/06/2018 06/27/2017 04/26/2017 04/29/2016  PHQ - 2 Score 0 0 0 0  PHQ- 9 Score - - 0 -     Cognitive Function Ad8 score reviewed for issues:  Issues making decisions:no  Less interest in hobbies / activities: no  Repeats questions, stories (family complaining):no  Trouble using ordinary gadgets (microwave, computer, phone):no  Forgets the month or year: no  Mismanaging finances: no  Remembering appts:no  Daily problems with thinking and/or memory:no Ad8 score is=0     MMSE - Mini Mental State Exam 06/27/2017 04/29/2016  Orientation to time 5 5  Orientation to Place 5 5  Registration 3 3  Attention/ Calculation 5 5  Recall 3 3  Language- name 2 objects 2 2  Language- repeat 1 1  Language- follow 3 step command 3 3  Language- read & follow direction 1 1  Write a sentence 1 1  Copy design 1 1  Total score 30 30        Immunization History  Administered Date(s) Administered  . Influenza, High Dose Seasonal PF 03/11/2015, 02/05/2016, 02/05/2017, 03/23/2018  . Influenza,inj,quad, With Preservative 05/23/2014  . Pneumococcal Polysaccharide-23 05/23/2014  . Td 02/05/2016  . Zoster 05/24/2009    Screening Tests Health Maintenance  Topic Date Due  . PNA vac Low Risk Adult (2 of 2 - PCV13) 05/24/2015  . TETANUS/TDAP  02/04/2026  . INFLUENZA VACCINE  Completed  . DEXA SCAN  Completed      Plan:    Please schedule your next medicare wellness visit with me in 1 yr.  Continue to eat heart healthy diet (full of fruits, vegetables, whole grains, lean protein,  water--limit salt, fat, and sugar intake) and increase physical  activity as tolerated.  Continue doing brain stimulating activities (puzzles, reading, adult coloring books, staying active) to keep memory sharp.     I have personally reviewed and noted the following in the patient's chart:   . Medical and social history . Use of alcohol, tobacco or illicit drugs  . Current medications and supplements . Functional ability and status . Nutritional status . Physical activity . Advanced directives . List of other physicians . Hospitalizations, surgeries, and ER visits in previous 12 months . Vitals . Screenings to include cognitive, depression, and falls . Referrals and appointments  In addition, I have reviewed and discussed with patient certain preventive protocols, quality metrics, and best practice recommendations. A written personalized care plan for preventive services as well as general preventive health recommendations were provided to patient.     Shela Nevin, [place]  07/06/2018   Medical screening examination/treatment was performed by qualified clinical staff member and as supervising physician I was immediately available for consultation/collaboration. I have reviewed documentation and agree with assessment and plan.  Penni Homans, MD

## 2018-07-06 ENCOUNTER — Other Ambulatory Visit: Payer: Self-pay | Admitting: Family Medicine

## 2018-07-06 ENCOUNTER — Ambulatory Visit (INDEPENDENT_AMBULATORY_CARE_PROVIDER_SITE_OTHER): Payer: Medicare HMO | Admitting: *Deleted

## 2018-07-06 ENCOUNTER — Encounter: Payer: Self-pay | Admitting: *Deleted

## 2018-07-06 VITALS — BP 120/64 | HR 60 | Ht <= 58 in | Wt 125.2 lb

## 2018-07-06 DIAGNOSIS — Z0000 Encounter for general adult medical examination without abnormal findings: Secondary | ICD-10-CM | POA: Diagnosis not present

## 2018-07-06 LAB — URINE CULTURE
MICRO NUMBER: 179703
SPECIMEN QUALITY:: ADEQUATE

## 2018-07-06 MED ORDER — CEFDINIR 300 MG PO CAPS: 300.0000 mg | ORAL_CAPSULE | Freq: Two times a day (BID) | ORAL | 0 refills | Status: DC

## 2018-07-06 NOTE — Patient Instructions (Signed)
Please schedule your next medicare wellness visit with me in 1 yr.  Continue to eat heart healthy diet (full of fruits, vegetables, whole grains, lean protein, water--limit salt, fat, and sugar intake) and increase physical activity as tolerated.  Continue doing brain stimulating activities (puzzles, reading, adult coloring books, staying active) to keep memory sharp.    Julie Wilkerson , Thank you for taking time to come for your Medicare Wellness Visit. I appreciate your ongoing commitment to your health goals. Please review the following plan we discussed and let me know if I can assist you in the future.   These are the goals we discussed: Goals    . Maintain healthy lifestyle    . Restart exercising 4x/ week.       This is a list of the screening recommended for you and due dates:  Health Maintenance  Topic Date Due  . Pneumonia vaccines (2 of 2 - PCV13) 05/24/2015  . Tetanus Vaccine  02/04/2026  . Flu Shot  Completed  . DEXA scan (bone density measurement)  Completed    Health Maintenance After Age 68 After age 52, you are at a higher risk for certain long-term diseases and infections as well as injuries from falls. Falls are a major cause of broken bones and head injuries in people who are older than age 52. Getting regular preventive care can help to keep you healthy and well. Preventive care includes getting regular testing and making lifestyle changes as recommended by your health care provider. Talk with your health care provider about:  Which screenings and tests you should have. A screening is a test that checks for a disease when you have no symptoms.  A diet and exercise plan that is right for you. What should I know about screenings and tests to prevent falls? Screening and testing are the best ways to find a health problem early. Early diagnosis and treatment give you the best chance of managing medical conditions that are common after age 66. Certain conditions and  lifestyle choices may make you more likely to have a fall. Your health care provider may recommend:  Regular vision checks. Poor vision and conditions such as cataracts can make you more likely to have a fall. If you wear glasses, make sure to get your prescription updated if your vision changes.  Medicine review. Work with your health care provider to regularly review all of the medicines you are taking, including over-the-counter medicines. Ask your health care provider about any side effects that may make you more likely to have a fall. Tell your health care provider if any medicines that you take make you feel dizzy or sleepy.  Osteoporosis screening. Osteoporosis is a condition that causes the bones to get weaker. This can make the bones weak and cause them to break more easily.  Blood pressure screening. Blood pressure changes and medicines to control blood pressure can make you feel dizzy.  Strength and balance checks. Your health care provider may recommend certain tests to check your strength and balance while standing, walking, or changing positions.  Foot health exam. Foot pain and numbness, as well as not wearing proper footwear, can make you more likely to have a fall.  Depression screening. You may be more likely to have a fall if you have a fear of falling, feel emotionally low, or feel unable to do activities that you used to do.  Alcohol use screening. Using too much alcohol can affect your balance and may make  you more likely to have a fall. What actions can I take to lower my risk of falls? General instructions  Talk with your health care provider about your risks for falling. Tell your health care provider if: ? You fall. Be sure to tell your health care provider about all falls, even ones that seem minor. ? You feel dizzy, sleepy, or off-balance.  Take over-the-counter and prescription medicines only as told by your health care provider. These include any  supplements.  Eat a healthy diet and maintain a healthy weight. A healthy diet includes low-fat dairy products, low-fat (lean) meats, and fiber from whole grains, beans, and lots of fruits and vegetables. Home safety  Remove any tripping hazards, such as rugs, cords, and clutter.  Install safety equipment such as grab bars in bathrooms and safety rails on stairs.  Keep rooms and walkways well-lit. Activity   Follow a regular exercise program to stay fit. This will help you maintain your balance. Ask your health care provider what types of exercise are appropriate for you.  If you need a cane or walker, use it as recommended by your health care provider.  Wear supportive shoes that have nonskid soles. Lifestyle  Do not drink alcohol if your health care provider tells you not to drink.  If you drink alcohol, limit how much you have: ? 0-1 drink a day for women. ? 0-2 drinks a day for men.  Be aware of how much alcohol is in your drink. In the [place] one drink equals one typical bottle of beer (12 oz), one-half glass of wine (5 oz), or one shot of hard liquor (1 oz).  Do not use any products that contain nicotine or tobacco, such as cigarettes and e-cigarettes. If you need help quitting, ask your health care provider. Summary  Having a healthy lifestyle and getting preventive care can help to protect your health and wellness after age 75.  Screening and testing are the best way to find a health problem early and help you avoid having a fall. Early diagnosis and treatment give you the best chance for managing medical conditions that are more common for people who are older than age 94.  Falls are a major cause of broken bones and head injuries in people who are older than age 35. Take precautions to prevent a fall at home.  Work with your health care provider to learn what changes you can make to improve your health and wellness and to prevent falls. This information is not intended  to replace advice given to you by your health care provider. Make sure you discuss any questions you have with your health care provider. Document Released: 03/23/2017 Document Revised: 03/23/2017 Document Reviewed: 03/23/2017 Elsevier Interactive Patient Education  2019 [employer].

## 2018-07-07 ENCOUNTER — Telehealth: Payer: Self-pay | Admitting: Family Medicine

## 2018-07-07 NOTE — Telephone Encounter (Signed)
Called patient  She is scheduled for an MRI on 07/08/18

## 2018-07-08 ENCOUNTER — Ambulatory Visit (HOSPITAL_BASED_OUTPATIENT_CLINIC_OR_DEPARTMENT_OTHER)
Admission: RE | Admit: 2018-07-08 | Discharge: 2018-07-08 | Disposition: A | Discharge status: Home / Self Care | Payer: Medicare HMO | Source: Ambulatory Visit | Attending: Family Medicine | Admitting: Family Medicine

## 2018-07-08 DIAGNOSIS — R102 Pelvic and perineal pain: Secondary | ICD-10-CM | POA: Diagnosis not present

## 2018-07-08 DIAGNOSIS — M48061 Spinal stenosis, lumbar region without neurogenic claudication: Secondary | ICD-10-CM | POA: Diagnosis not present

## 2018-07-11 ENCOUNTER — Telehealth: Payer: Self-pay

## 2018-07-11 ENCOUNTER — Ambulatory Visit: Payer: Self-pay | Admitting: *Deleted

## 2018-07-11 NOTE — Telephone Encounter (Signed)
Copied from [place] [id number]. Topic: Quick Communication - Other Results (Clinic Use ONLY) >> Jul 10, 2018  9:29 AM Scherrie Gerlach wrote: Pt would like a call back concerning the MRI she had 07/08/18.

## 2018-07-11 NOTE — Telephone Encounter (Signed)
Pt states she is still having extreme pain.  Pt has been waiting to hear back.  Looks like Dr Charlett Blake was going to refer to neurosurgeon.  Please call pt asap and advise on what she should do next.  thanks

## 2018-07-11 NOTE — Telephone Encounter (Signed)
Chart opened to answer a question the agent had.

## 2018-07-12 NOTE — Telephone Encounter (Signed)
Spoke with patient about Mri resutls she will see PCP on Monday

## 2018-07-12 NOTE — Telephone Encounter (Signed)
Thanks, Spoke with patient about results she will see PCP on Monday

## 2018-07-13 NOTE — Progress Notes (Signed)
Subjective:   Julie Wilkerson was seen in consultation in the movement disorder clinic at the request of [name], MD.  The evaluation is for tremor.  Tremor started approximately 5 years ago and involves the R more than L arm, but sometimes it involves the jaw as well.  Tremor is most noticeable when she uses the arms or holds the arms the arms antigravity.   There is no family hx of tremor.    Tremor characteristics: Affected by caffeine:  unknown (doesn't drink any caffeine) Affected by alcohol:  Unknown (doesn't drink any) Affected by stress:  Yes.   Affected by fatigue:  No. Spills soup if on spoon:  May or may not Spills glass of liquid if full:  Has to hold with both hands Affects ADL's (tying shoes, brushing teeth, etc):  No.  Current/Previously tried tremor medications: metoprolol   Current medications that may exacerbate tremor:  symbicort (for very mild asthma - uses it about 3 times a month) - notes much increased tremor with this.  Also c/o L sided headache.  Has had this "since I was a little girl."  Was diagnosed as migraine as child.  Found beef as trigger and started to get better after d/c beef.  About a month ago, it got worse and felt like ice pick in eye.  Going today for eye appt.  Has phophotophobia and phonophobia.  Has nausea.  No emesis.  Takes tylenol or occasionally advil.    Neuroimaging: performed but years ago and she doesn't know results  Outside reports reviewed: historical medical records and office notes.  07/14/18 update: Patient is seen today in follow-up.  I have not seen her since 2017.  I saw her then for tremor and headache.  I have reviewed records since our last visit.  She had an appointment in 06/2017 but n/s and then again in May, 2019 to discuss headache, but was not seen on that day.  She does still have some left-sided migraine, but that has not changed with the course of time.  She returns today to discuss worsening tremor.  She did  have a CT of the brain since our last visit.    CT of the brain was performed on March 29, 2017 for headache.  This was nonacute.  There is atrophy and small vessel disease.  I have reviewed this.  Today, she states that she is here to discuss tremor.  she has had worsening tremor.  She was started on primidone when I saw her in 2017, but she called not long after starting the medication and stated that she felt nauseated.  However, she did not start with half tablets as was prescribed.  She was told to retry the medication with half tablets.  She never called back, nor has she followed up since 2017.  She doesn't remember this, nor does she remember ever being seen at this office.  She insists that she has never been here, so cannot state how long she took primidone.  She reports that R hand shakes more than the L hand.  She is having jaw tremor.  She is spilling liquids when trying to drink.  She has trouble with eating.  She also c/o memory loss.  She goes into a room and forgets what she went into the room for.   Living situation:  Pt lives alone.  The patient does do the finances in the home.  The patient does drive and gets to  familiar places but doesn't like to drive on the highway and gets someone to drive in unfamiliar territories (like to my office).  She had quit driving because of a "nervous breakdown" but restarted about 6 years ago.   There have no been any motor vehicle accidents in the recent years.  The patient does cook.  There is no difficulty remembering common recipes.  The stovetop has not been left on accidentally.  ADL's:  The patient is able to perform her own ADL's. The patient self medicates.The patients bowel is under control but occasionally has "bladder leaks."  Previous meds: primidone (tried few dosages but didn't start at 1/2 tablets and felt nauseated)  No Known Allergies  Outpatient Encounter Medications as of 07/14/2018  Medication Sig  . aspirin 81 MG tablet Take 81 mg  by mouth daily.  . beclomethasone (QVAR REDIHALER) 80 MCG/ACT inhaler Inhale 2 puffs into the lungs 2 (two) times daily.  . citalopram (CELEXA) 20 MG tablet Take 1.5 tablets (30 mg total) by mouth daily.  . Collagen-Vitamin C (COLLAGEN PLUS VITAMIN C PO) Take 1 tablet by mouth daily.  Marland Kitchen levothyroxine (SYNTHROID, LEVOTHROID) 50 MCG tablet Take 1 tablet (50 mcg total) by mouth daily.  Marland Kitchen lisinopril (PRINIVIL,ZESTRIL) 20 MG tablet Take 1 tablet (20 mg total) by mouth daily.  . metoprolol succinate (TOPROL-XL) 25 MG 24 hr tablet Take 1 tablet (25 mg total) by mouth daily.  . Multiple Vitamins-Minerals (MULTIVITAMIN ADULT PO) Take 1 tablet by mouth daily.  . Probiotic Product (PROBIOTIC DAILY PO) Take by mouth daily.  . primidone (MYSOLINE) 50 MG tablet 1/2 tablet for 7 days, then 1 tablet daily  . [DISCONTINUED] benzonatate (TESSALON) 100 MG capsule Take 1 capsule (100 mg total) by mouth every 8 (eight) hours. (Patient not taking: Reported on 07/06/2018)  . [DISCONTINUED] cefdinir (OMNICEF) 300 MG capsule Take 1 capsule (300 mg total) by mouth 2 (two) times daily for 10 days.  . [DISCONTINUED] fluticasone (FLONASE) 50 MCG/ACT nasal spray Place 2 sprays into both nostrils daily.  . [DISCONTINUED] HYDROcodone-homatropine (HYCODAN) 5-1.5 MG/5ML syrup Take 5 mLs by mouth every 6 (six) hours as needed for cough. (Patient not taking: Reported on 07/06/2018)  . [DISCONTINUED] omeprazole (PRILOSEC) 40 MG capsule Take 1 capsule (40 mg total) by mouth daily. (Patient not taking: Reported on 07/06/2018)  . [DISCONTINUED] phenazopyridine (PYRIDIUM) 200 MG tablet Take 1 tablet (200 mg total) by mouth 3 (three) times daily as needed for pain.  . [DISCONTINUED] prednisoLONE acetate (PRED FORTE) 1 % ophthalmic suspension prednisolone acetate 1 % eye drops,suspension  INSTILL 1 DROP IN THE SURGICAL EYE 4 TIMES A DAY STARTING THE DAY AFTER SURGERY AS DIRECTED   No facility-administered encounter medications on file as of  07/14/2018.     Past Medical History:  Diagnosis Date  . Asthma   . Breast cancer ([place])    2002, left, encapsulated, microcalcifications. lumpectomy, radtiation x 30  . Breast cancer in female [facility])   . Change in mole 06/08/2016  . Colitis   . Decreased visual acuity 01/16/2017  . Dyspnea 11/04/2016  . Dysuria 11/04/2016  . Gluten intolerance   . History of chicken pox   . History of Helicobacter pylori infection 08/01/2015  . Hyperlipidemia   . Hypertension   . Hypothyroid   . IBS (irritable bowel syndrome)   . Left leg pain 03/01/2017  . Low back pain 08/10/2015  . Neck pain 03/01/2017  . Osteoporosis   . Personal history of radiation therapy   .  RLQ discomfort 03/01/2017  . Stomach cramps   . Urinary tract infection 11/04/2016    Past Surgical History:  Procedure Laterality Date  . ABDOMINAL HYSTERECTOMY     partial  . APPENDECTOMY    . BREAST LUMPECTOMY Left 2002  . CHOLECYSTECTOMY  Age 85 or 80  . COLONOSCOPY  2017  . EYE SURGERY Left    cataract  . FRACTURE SURGERY    . TONSILLECTOMY      Social History   Socioeconomic History  . Marital status: Single    Spouse name: Not on file  . Number of children: Not on file  . Years of education: Not on file  . Highest education level: Not on file  Occupational History  . Occupation: retired    Comment: Pharmacist, hospital, kindergarten  Social Needs  . Financial resource strain: Not on file  . Food insecurity:    Worry: Not on file    Inability: Not on file  . Transportation needs:    Medical: Not on file    Non-medical: Not on file  Tobacco Use  . Smoking status: Never Smoker  . Smokeless tobacco: Never Used  Substance and Sexual Activity  . Alcohol use: No    Alcohol/week: 0.0 standard drinks  . Drug use: No  . Sexual activity: Never    Comment: lives alone, avoids daiiry and gluten. 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 Physical activity:    Days per week: Not on file    Minutes per session: Not on file  .  Stress: Not on file  Relationships  . Social connections:    Talks on phone: Not on file    Gets together: Not on file    Attends religious service: Not on file    Active member of club or organization: Not on file    Attends meetings of clubs or organizations: Not on file    Relationship status: Not on file  . Intimate partner violence:    Fear of current or ex partner: Not on file    Emotionally abused: Not on file    Physically abused: Not on file    Forced sexual activity: Not on file  Other Topics Concern  . Not on file  Social History Narrative  . Not on file    Family Status  Relation Name Status  . Mother  Deceased at age 96  . Father  Deceased  . Brother  55, age 41y  . PGM  Deceased  . Brother  Alive, age 61y  . Brother  40, age 68y  . Sister  Aloha Gell, age 75y  . Daughter  Alive, age 72y  . MGM  Deceased  . MGF  Deceased  . PGF  Deceased  . Daughter 51 Alive  . Daughter 73 Alive  . Neg Hx  (Not Specified)    Review of Systems Review of Systems  Constitutional: Negative.   HENT: Negative.   Eyes: Negative.   Respiratory: Negative.   Cardiovascular: Negative.   Gastrointestinal: Positive for abdominal pain.  Musculoskeletal: Positive for back pain.  Skin: Negative.   Endo/Heme/Allergies: Negative.      Objective:   VITALS:   Vitals:   07/14/18 1023  BP: 134/60  Pulse: 62  SpO2: 94%  Weight: 125 lb (56.7 kg)  Height: 4\' 11"  (1.499 m)    Gen:  Appears stated age and in NAD. HEENT:  Normocephalic, atraumatic. The mucous membranes are moist. The superficial temporal arteries are without ropiness or  tenderness. Cardiovascular: brady.  regular Lungs: Clear to auscultation bilaterally. Neck: There are no carotid bruits noted bilaterally.  NEUROLOGICAL:  Orientation:   Montreal Cognitive Assessment  07/14/2018  Visuospatial/ Executive (0/5) 4  Naming (0/3) 3  Attention: Read list of digits (0/2) 1  Attention: Read list of letters (0/1) 1    Attention: Serial 7 subtraction starting at 100 (0/3) 3  Language: Repeat phrase (0/2) 1  Language : Fluency (0/1) 0  Abstraction (0/2) 1  Delayed Recall (0/5) 4  Orientation (0/6) 6  Total 24  Adjusted Score (based on education) 24   Cranial nerves: There is good facial symmetry.  Extraocular muscles are intact and visual fields are full to confrontational testing. Speech is fluent and clear. Soft palate rises symmetrically and there is no tongue deviation. Hearing is intact to conversational tone. Tone: Tone is good throughout. Sensation: Sensation is intact to light touch to light touch throughout.  Vibration is intact at the bilateral big toe.   Coordination:  The patient has no difficulty with RAM's or FNF bilaterally. Motor: Strength is 5/5 in the bilateral upper and lower extremities.  Shoulder shrug is equal bilaterally.  There is no pronator drift.  There are no fasciculations noted. DTR's: Deep tendon reflexes are 2/4 at the bilateral biceps, triceps, brachioradialis, patella and achilles.  Plantar responses are downgoing bilaterally. Gait and Station: The patient pushes off of the chair.  She is antalgic with ambulation.    MOVEMENT EXAM: Tremor:  There is tremor in the UE, noted most significantly with action.  It is a little worse in the R than the L.  It is moderate in the right.   The patient is has evidence of tremor with Archimedes spirals on the right.  There is no tremor at rest.  There is intermittent chin tremor. The patient is able to pour water from one glass to another without spilling it but she actually has tremor evident but she compensates well (no significant difference from 3 years ago)  Labs:  Lab Results  Component Value Date   TSH 1.65 05/04/2018     Chemistry      Component Value Date/Time   NA 138 06/27/2018 1037   K 4.3 06/27/2018 1037   CL 103 06/27/2018 1037   CO2 28 06/27/2018 1037   BUN 27 (H) 06/27/2018 1037   CREATININE 1.07 06/27/2018  1037   CREATININE 1.20 (H) 07/15/2017 1603      Component Value Date/Time   CALCIUM 9.3 06/27/2018 1037   ALKPHOS 48 06/27/2018 1037   AST 21 06/27/2018 1037   ALT 20 06/27/2018 1037   BILITOT 0.6 06/27/2018 1037     Lab Results  Component Value Date   VITAMINB12 768 03/30/2016   Lab Results  Component Value Date   FOLATE 14.8 03/30/2016        Assessment/Plan:   1.  Essential Tremor.  -This is evidenced by the symmetrical nature and longstanding hx of gradually getting worse.  We discussed nature and pathophysiology.  We discussed that this can continue to gradually get worse with time.  We discussed that some medications can worsen this, as can caffeine use.  We discussed medication therapy as well as surgical therapy.  Cannot increase her metoprolol due to bradycardia.  I started her on primidone in 2017, but she does not remember this.  She did call back and stated she had had nausea, but she did not start it properly, with half tablets.  We  will retry again.  She is to take primidone, 50 mg, half tablet for 1 week and then increase to 1 tablet at bedtime.  Discussed risk, benefits, and side effects. 2.  Migraine without aura, L eye  -long hx of migraine, without change.  She has trouble stating frequency today. 3.  Memory change  -check b12, folate, rpr  -discussed neurocognitive testing.  She really does not wish to go out of town for neurocognitive testing, nor does she wish to be referred elsewhere.  We have just recently hired somebody, but that person will not be in the office until late summer.  She would like to wait until he comes.  She was placed on a wait list. 4.  I will see her back in the office after neurocognitive testing has been completed.  Safety was discussed in detail with her today.  Much greater than 50% of this visit was spent in counseling and coordinating care.  Total face to face time:  40 min    CC:  [name], MD

## 2018-07-14 ENCOUNTER — Ambulatory Visit: Payer: Medicare HMO | Admitting: Neurology

## 2018-07-14 ENCOUNTER — Other Ambulatory Visit (INDEPENDENT_AMBULATORY_CARE_PROVIDER_SITE_OTHER): Payer: Medicare HMO

## 2018-07-14 ENCOUNTER — Encounter: Payer: Self-pay | Admitting: Neurology

## 2018-07-14 VITALS — BP 134/60 | HR 62 | Ht 59.0 in | Wt 125.0 lb

## 2018-07-14 DIAGNOSIS — R251 Tremor, unspecified: Secondary | ICD-10-CM | POA: Diagnosis not present

## 2018-07-14 DIAGNOSIS — G43009 Migraine without aura, not intractable, without status migrainosus: Secondary | ICD-10-CM

## 2018-07-14 DIAGNOSIS — R413 Other amnesia: Secondary | ICD-10-CM

## 2018-07-14 MED ORDER — PRIMIDONE 50 MG PO TABS: ORAL_TABLET | ORAL | 1 refills | Status: DC

## 2018-07-14 NOTE — Patient Instructions (Addendum)
1. Start Primidone 50 mg tablets. Take 1/2 tablet for 7 nights, then increase to 1 tablet. Prescription has been sent to your pharmacy. The first dose of medication can cause some dizziness/nausea that should go away after the first dose.   2. Your provider has requested that you have labwork completed today. Please go to [facility] ([id number]) on the second floor of this building before leaving the office today. You do not need to check in. If you are not called within 15 minutes please check with the front desk.   3. We will call you to schedule Neurocognitive testing.

## 2018-07-17 ENCOUNTER — Ambulatory Visit (HOSPITAL_BASED_OUTPATIENT_CLINIC_OR_DEPARTMENT_OTHER)
Admission: RE | Admit: 2018-07-17 | Discharge: 2018-07-17 | Disposition: A | Discharge status: Home / Self Care | Payer: Medicare HMO | Source: Ambulatory Visit | Attending: Obstetrics & Gynecology | Admitting: Obstetrics & Gynecology

## 2018-07-17 ENCOUNTER — Ambulatory Visit (INDEPENDENT_AMBULATORY_CARE_PROVIDER_SITE_OTHER): Payer: Medicare HMO | Admitting: Family Medicine

## 2018-07-17 ENCOUNTER — Encounter: Payer: Self-pay | Admitting: Obstetrics & Gynecology

## 2018-07-17 ENCOUNTER — Ambulatory Visit (INDEPENDENT_AMBULATORY_CARE_PROVIDER_SITE_OTHER): Payer: Medicare HMO | Admitting: Obstetrics & Gynecology

## 2018-07-17 ENCOUNTER — Telehealth: Payer: Self-pay | Admitting: Neurology

## 2018-07-17 VITALS — BP 122/64 | HR 64 | Temp 97.7°F | Resp 18 | Wt 125.0 lb

## 2018-07-17 VITALS — BP 110/57 | HR 56 | Ht 59.0 in | Wt 125.1 lb

## 2018-07-17 DIAGNOSIS — R102 Pelvic and perineal pain: Secondary | ICD-10-CM

## 2018-07-17 DIAGNOSIS — M810 Age-related osteoporosis without current pathological fracture: Secondary | ICD-10-CM | POA: Diagnosis not present

## 2018-07-17 DIAGNOSIS — N390 Urinary tract infection, site not specified: Secondary | ICD-10-CM | POA: Diagnosis not present

## 2018-07-17 LAB — VITAMIN B12: Vitamin B-12: 1298 pg/mL — ABNORMAL HIGH (ref 200–1100)

## 2018-07-17 LAB — RPR: RPR Ser Ql: NONREACTIVE

## 2018-07-17 LAB — FOLATE: Folate: 22 ng/mL

## 2018-07-17 NOTE — Progress Notes (Signed)
Subjective:    Patient ID: Julie Wilkerson, female    DOB: 01/01/1941, 78 y.o.   MRN: [id number]  No chief complaint on file.   HPI Patient is in today for follow up. She continues to struggle with left sided low back pain, left lower quadrant abdominal pain and left hip pain with radicular pain into left thigh. No fall or trauma. No numbness, weakness into foot. Her bowels continue to move regularly. No bloody or tarry stool. No urinary symptoms. Denies CP/palp/SOB/HA/congestion/fevers. Taking meds as prescribed  Past Medical History:  Diagnosis Date  . Asthma   . Breast cancer ([place])    2002, left, encapsulated, microcalcifications. lumpectomy, radtiation x 30  . Breast cancer in female [facility])   . Change in mole 06/08/2016  . Colitis   . Decreased visual acuity 01/16/2017  . Dyspnea 11/04/2016  . Dysuria 11/04/2016  . Gluten intolerance   . History of chicken pox   . History of Helicobacter pylori infection 08/01/2015  . Hyperlipidemia   . Hypertension   . Hypothyroid   . IBS (irritable bowel syndrome)   . Left leg pain 03/01/2017  . Low back pain 08/10/2015  . Neck pain 03/01/2017  . Osteoporosis   . Personal history of radiation therapy   . RLQ discomfort 03/01/2017  . Stomach cramps   . Urinary tract infection 11/04/2016    Past Surgical History:  Procedure Laterality Date  . ABDOMINAL HYSTERECTOMY     partial  . APPENDECTOMY    . BREAST LUMPECTOMY Left 2002  . CHOLECYSTECTOMY  Age 89 or 60  . COLONOSCOPY  2017  . EYE SURGERY Left    cataract  . FRACTURE SURGERY    . TONSILLECTOMY      Family History  Problem Relation Age of Onset  . Colitis Mother   . Irritable bowel syndrome Mother   . Hypertension Father   . Hyperlipidemia Brother   . Heart attack Brother   . Stomach cancer Paternal Grandmother 72  . Hyperlipidemia Brother   . Heart attack Brother   . Hyperlipidemia Brother   . Heart attack Brother   . Hyperlipidemia Sister   . Leukemia Daughter   .  Arthritis Daughter   . Heart disease Daughter        ASD vs VSD  . Colon cancer Neg Hx     Social History   Socioeconomic History  . Marital status: Single    Spouse name: Not on file  . Number of children: Not on file  . Years of education: Not on file  . Highest education level: Not on file  Occupational History  . Occupation: retired    Comment: Pharmacist, hospital, kindergarten  Social Needs  . Financial resource strain: Not on file  . Food insecurity:    Worry: Not on file    Inability: Not on file  . Transportation needs:    Medical: Not on file    Non-medical: Not on file  Tobacco Use  . Smoking status: Never Smoker  . Smokeless tobacco: Never Used  Substance and Sexual Activity  . Alcohol use: No    Alcohol/week: 0.0 standard drinks  . Drug use: No  . Sexual activity: Not Currently    Birth control/protection: None    Comment: lives alone, avoids daiiry and gluten. 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 Physical activity:    Days per week: Not on file    Minutes per session: Not on file  . Stress: Not  on file  Relationships  . Social connections:    Talks on phone: Not on file    Gets together: Not on file    Attends religious service: Not on file    Active member of club or organization: Not on file    Attends meetings of clubs or organizations: Not on file    Relationship status: Not on file  . Intimate partner violence:    Fear of current or ex partner: Not on file    Emotionally abused: Not on file    Physically abused: Not on file    Forced sexual activity: Not on file  Other Topics Concern  . Not on file  Social History Narrative  . Not on file    Outpatient Medications Prior to Visit  Medication Sig Dispense Refill  . aspirin 81 MG tablet Take 81 mg by mouth daily.    . beclomethasone (QVAR REDIHALER) 80 MCG/ACT inhaler Inhale 2 puffs into the lungs 2 (two) times daily. 1 Inhaler 0  . citalopram (CELEXA) 20 MG tablet Take 1.5 tablets (30 mg total) by  mouth daily. 135 tablet 0  . Collagen-Vitamin C (COLLAGEN PLUS VITAMIN C PO) Take 1 tablet by mouth daily.    Marland Kitchen levothyroxine (SYNTHROID, LEVOTHROID) 50 MCG tablet Take 1 tablet (50 mcg total) by mouth daily. 90 tablet 1  . lisinopril (PRINIVIL,ZESTRIL) 20 MG tablet Take 1 tablet (20 mg total) by mouth daily. 90 tablet 3  . metoprolol succinate (TOPROL-XL) 25 MG 24 hr tablet Take 1 tablet (25 mg total) by mouth daily. 30 tablet 11  . Multiple Vitamins-Minerals (MULTIVITAMIN ADULT PO) Take 1 tablet by mouth daily.    . primidone (MYSOLINE) 50 MG tablet 1/2 tablet for 7 days, then 1 tablet daily (Patient not taking: Reported on 07/17/2018) 90 tablet 1  . Probiotic Product (PROBIOTIC DAILY PO) Take by mouth daily.     No facility-administered medications prior to visit.     No Known Allergies  Review of Systems  Constitutional: Positive for malaise/fatigue. Negative for fever.  HENT: Negative for congestion.   Eyes: Negative for blurred vision.  Respiratory: Negative for shortness of breath.   Cardiovascular: Negative for chest pain, palpitations and leg swelling.  Gastrointestinal: Positive for abdominal pain. Negative for blood in stool and nausea.  Genitourinary: Negative for dysuria and frequency.  Musculoskeletal: Positive for joint pain. Negative for falls.  Skin: Negative for rash.  Neurological: Negative for dizziness, loss of consciousness and headaches.  Endo/Heme/Allergies: Negative for environmental allergies.  Psychiatric/Behavioral: Negative for depression. The patient has insomnia. The patient is not nervous/anxious.        Objective:    Physical Exam Vitals signs and nursing note reviewed.  Constitutional:      General: She is not in acute distress.    Appearance: She is well-developed.  HENT:     Head: Normocephalic and atraumatic.     Nose: Nose normal.  Eyes:     General:        Right eye: No discharge.        Left eye: No discharge.  Neck:      Musculoskeletal: Normal range of motion and neck supple.  Cardiovascular:     Rate and Rhythm: Normal rate and regular rhythm.     Heart sounds: No murmur.  Pulmonary:     Effort: Pulmonary effort is normal.     Breath sounds: Normal breath sounds.  Abdominal:     General: Bowel sounds are normal.  Palpations: Abdomen is soft.     Tenderness: There is no abdominal tenderness.  Musculoskeletal:     Right lower leg: No edema.     Left lower leg: No edema.     Comments: No pain with palpation or rotation of right hip but there is decreased ROM secondary to pain in left hip.   Skin:    General: Skin is warm and dry.  Neurological:     Mental Status: She is alert and oriented to person, place, and time.     BP 122/64 (BP Location: Left Arm, Patient Position: Sitting, Cuff Size: Normal)   Pulse 64   Temp 97.7 F (36.5 C) (Oral)   Resp 18   Wt 125 lb (56.7 kg)   SpO2 96%   BMI 25.25 kg/m  Wt Readings from Last 3 Encounters:  07/17/18 125 lb (56.7 kg)  07/17/18 125 lb 1.3 oz (56.7 kg)  07/14/18 125 lb (56.7 kg)     Lab Results  Component Value Date   WBC 7.1 06/27/2018   HGB 11.5 (L) 06/27/2018   HCT 35.1 (L) 06/27/2018   PLT 257.0 06/27/2018   GLUCOSE 93 06/27/2018   CHOL 237 (H) 05/04/2018   TRIG 189.0 (H) 05/04/2018   HDL 61.00 05/04/2018   LDLCALC 138 (H) 05/04/2018   ALT 20 06/27/2018   AST 21 06/27/2018   NA 138 06/27/2018   K 4.3 06/27/2018   CL 103 06/27/2018   CREATININE 1.07 06/27/2018   BUN 27 (H) 06/27/2018   CO2 28 06/27/2018   TSH 1.65 05/04/2018   HGBA1C 6.0 12/22/2017    Lab Results  Component Value Date   TSH 1.65 05/04/2018   Lab Results  Component Value Date   WBC 7.1 06/27/2018   HGB 11.5 (L) 06/27/2018   HCT 35.1 (L) 06/27/2018   MCV 89.4 06/27/2018   PLT 257.0 06/27/2018   Lab Results  Component Value Date   NA 138 06/27/2018   K 4.3 06/27/2018   CO2 28 06/27/2018   GLUCOSE 93 06/27/2018   BUN 27 (H) 06/27/2018    CREATININE 1.07 06/27/2018   BILITOT 0.6 06/27/2018   ALKPHOS 48 06/27/2018   AST 21 06/27/2018   ALT 20 06/27/2018   PROT 6.6 06/27/2018   ALBUMIN 4.0 06/27/2018   CALCIUM 9.3 06/27/2018   ANIONGAP 7 06/08/2018   GFR 49.78 (L) 06/27/2018   Lab Results  Component Value Date   CHOL 237 (H) 05/04/2018   Lab Results  Component Value Date   HDL 61.00 05/04/2018   Lab Results  Component Value Date   LDLCALC 138 (H) 05/04/2018   Lab Results  Component Value Date   TRIG 189.0 (H) 05/04/2018   Lab Results  Component Value Date   CHOLHDL 4 05/04/2018   Lab Results  Component Value Date   HGBA1C 6.0 12/22/2017       Assessment & Plan:   Problem List Items Addressed This Visit    Osteoporosis    Encouraged to get adequate exercise, calcium and vitamin d intake. She has significantly increased pain in her left hip over several weeks now. MRI of spine shows significant pathology but no acute concerns. Consider further imaging of hip due to severity of pain.      Urinary tract infection - Primary    Repeat UA and culture      Relevant Orders   Urinalysis   Urine Culture   Pelvic pain    Is headed for pelvic  ultrasound after being evaluated by gynecology         I am having Van Clines. Brumett maintain her Multiple Vitamins-Minerals (MULTIVITAMIN ADULT PO), Collagen-Vitamin C (COLLAGEN PLUS VITAMIN C PO), aspirin, beclomethasone, levothyroxine, metoprolol succinate, citalopram, Probiotic Product (PROBIOTIC DAILY PO), lisinopril, and primidone.  No orders of the defined types were placed in this encounter.    Penni Homans, MD

## 2018-07-17 NOTE — Assessment & Plan Note (Signed)
Is headed for pelvic ultrasound after being evaluated by gynecology

## 2018-07-17 NOTE — Assessment & Plan Note (Signed)
Encouraged to get adequate exercise, calcium and vitamin d intake. She has significantly increased pain in her left hip over several weeks now. MRI of spine shows significant pathology but no acute concerns. Consider further imaging of hip due to severity of pain.

## 2018-07-17 NOTE — Patient Instructions (Signed)
Tylenol/Arthritis ES 500 mg 1 tab three x daily and can increase to 2 tabs 3 x daily as needed Hip Fracture  A hip fracture is a break in the upper part of the thigh bone (femur). This is usually the result of an injury, commonly a fall. What are the causes? This condition may be caused by:  A direct hit or injury (trauma) to the side of the hip, such as from a fall or a car accident. What increases the risk? You are more likely to develop this condition if:  You have poor balance or an unsteady walking pattern (gait). Certain conditions contribute to poor balance, including Parkinson disease and dementia.  You have thinning or weakening of your bones, such as from osteopenia or osteoporosis.  You have cancer that spreads to the leg bones.  You have certain conditions that can weaken your bones, such as thyroid disorders, intestine disorders, or a lack (deficiency) of certain nutrients.  You smoke.  You take certain medicines, such as steroids.  You have a history of broken bones. What are the signs or symptoms? Symptoms of this condition include:  Pain over the injured hip. This is commonly felt on the side of the hip or in the front groin area.  Stiffness, bruising, and swelling over the hip.  Pain with movement of the leg, especially lifting it up. Pain often gets better with rest.  Difficulty or inability to stand, walk, or use the leg to support body weight (put weight on the leg).  The leg rolling outward when lying down.  The affected leg being shorter than the other leg. How is this diagnosed? This condition may be diagnosed based on:  Your symptoms.  A physical exam.  X-rays. These may be done: ? To confirm the diagnosis. ? To determine the type and location of the fracture. ? To check for other injuries.  MRI or CT scans. These may be done if the fracture is not visible on an X-ray. How is this treated? Treatment for this condition depends on the severity  and location of your fracture. In most cases, surgery is necessary. Surgery may involve:  Repairing the fracture with a screw, nail, or rod to hold the bone in place (open reduction and internal fixation, ORIF).  Replacing the damaged parts of the femur with metal implants (hemiarthroplasty or arthroplasty). If your fracture is less severe, or if you are not eligible for surgery, you may have non-surgical treatment. Non-surgical treatment may involve:  Using crutches, a walker, or a wheelchair until your health care provider says that you can support (bear) weight on your hip.  Medicines to help reduce pain and swelling.  Having regular X-rays to monitor your fracture and make sure that it is healing.  Physical therapy. You may need physical therapy after surgery, too. Follow these instructions at home: Activity  Do not use your injured leg to support your body weight until your health care provider says that you can. ? Follow standing and walking restrictions as told by your health care provider. ? Use crutches, a walker, or a wheelchair as directed.  Avoid any activities that cause pain or irritation in your hip. Ask your health care provider what activities are safe for you.  Do not drive or use heavy machinery until your health care provider approves.  If physical therapy was prescribed, do exercises as told by your health care provider. General instructions  Take over-the-counter and prescription medicines only as told by your  health care provider.  If directed, put ice on the injured area: ? Put ice in a plastic bag. ? Place a towel between your skin and the bag. ? Leave the ice on for 20 minutes, 2-3 times a day.  Do not use any products that contain nicotine or tobacco, such as cigarettes and e-cigarettes. These can delay bone healing. If you need help quitting, ask your health care provider.  Keep all follow-up visits as told by your health care provider. This is  important. How is this prevented?  To prevent falls at home: ? Use a cane, walker, or wheelchair as directed. ? Make sure your rooms and hallways are free of clutter, obstacles, and cords. ? Install grab bars in your bedroom and bathrooms. ? Always use handrails when going up and down stairs. ? Use nightlights around the house.  Exercise regularly. Ask what forms of exercise are safe for you, such as walking and strength and balance exercises.  Visit an eye doctor regularly to have your eyesight checked. This can help prevent falls.  Make sure you get enough calcium and vitamin D.  Do not use any products that contain nicotine or tobacco, such as cigarettes and e-cigarettes. If you need help quitting, ask your health care provider.  Limit alcohol use.  If you have an underlying condition that caused your hip fracture, work with your health care provider to manage your condition. Contact a health care provider if:  Your pain gets worse or it does not get better with rest or medicine.  You develop any of the following in your leg or foot: ? Numbness. ? Tingling. ? A change in skin color (discoloration). ? Skin feeling cold to the touch. Get help right away if:  Your pain suddenly gets worse.  You cannot move your hip. Summary  A hip fracture is a break in the upper part of the thigh bone (femur).  Treatment typically require surgical management to restore stability and function to the hip.  Pain medicine and icing of the affected leg can help manage pain and swelling. Follow directions as told by your health care provider. This information is not intended to replace advice given to you by your health care provider. Make sure you discuss any questions you have with your health care provider. Document Released: 05/10/2005 Document Revised: 01/28/2018 Document Reviewed: 06/12/2016 Elsevier Interactive Patient Education  2019 [employer].

## 2018-07-17 NOTE — Assessment & Plan Note (Signed)
Repeat UA and culture 

## 2018-07-17 NOTE — Telephone Encounter (Signed)
-----   Message from Robie [place], DO sent at 07/16/2018 11:27 AM EST ----- Let pt know that labs ok.  If she is taking a b12 supplement, probably doesn't need it

## 2018-07-17 NOTE — Telephone Encounter (Signed)
Mychart message sent to patient.

## 2018-07-17 NOTE — Progress Notes (Signed)
Subjective:     Julie Wilkerson is a 78 y.o. female here for a routine exam.  Current complaints: Pt has pain in hoer left side that started 5 weeks ago. She has taken some OTC meds (Advil) with only limited relief. The pain now extends to her left side. She was treated for a UTI to see if it was related to the kidneys. She was on atbx until last night with no change in the sx. Pt is now walking with a cane since the pain started. The pain feels deep on the left side. She points to her pubic bone. She initially 'felt that it was the bone.'  It increases with movement. Sleep is uncomfortable due to movement. It is better in the am. The pain is also improved with rest. The pain is worse with walking.   Pt had a x-ray of her spine. Pt prev had back/lumbar pain but, not related to this . Not sexually >20 years.           Gynecologic History No LMP recorded. Patient has had a hysterectomy. Contraception: status post hysterectomy 1980 both ovaries were left in place Last GYN exam:  1995 Last mammogram: 04/2018. Results were: normal. S/p breast cancer 2002 s/p lumpectomy  Obstetric History OB History  Gravida Para Term Preterm AB Living  4 3 3   1 3   SAB TAB Ectopic Multiple Live Births  1       3    # Outcome Date GA Lbr Len/2nd Weight Sex Delivery Anes PTL Lv  4 Term 1979 [redacted]w[redacted]d   F Vag-Spont Local N LIV  3 Term 1977 [redacted]w[redacted]d   F Vag-Spont Local N LIV  2 Term 1974 [redacted]w[redacted]d   F Vag-Spont Local N LIV  1 SAB 1972           Pt is s/p T&A, Appy, Choley, Breast lumpectomy; hystecectomy   The following portions of the patient's history were reviewed and updated as appropriate: allergies, current medications, past family history, past medical history, past social history, past surgical history and problem list.  Review of Systems Pertinent items are noted in HPI.    Objective:  BP (!) 110/57   Pulse (!) 56   Ht 4\' 11"  (1.499 m)   Wt 125 lb 1.3 oz (56.7 kg)   BMI 25.26 kg/m  General Appearance:     Alert, cooperative, no distress, appears stated age  Head:    Normocephalic, without obvious abnormality, atraumatic  Eyes:    conjunctiva/corneas clear, EOM's intact, both eyes  Ears:    Normal external ear canals, both ears  Nose:   Nares normal, septum midline, mucosa normal, no drainage    or sinus tenderness  Throat:   Lips, mucosa, and tongue normal; teeth and gums normal  Neck:   Supple, symmetrical, trachea midline, no adenopathy;    thyroid:  no enlargement/tenderness/nodules  Back:     Symmetric, no curvature, ROM normal, no CVA tenderness  Lungs:     Clear to auscultation bilaterally, respirations unlabored  Chest Wall:    No tenderness or deformity   Heart:    Regular rate and rhythm, S1 and S2 normal, no murmur, rub   or gallop  Breast Exam:    No tenderness, masses, or nipple abnormality  Abdomen:     Tender, bowel sounds active all four quadrants,   difficult to assess due to voluntary guarding. There is no rebound or rigidity. Pt could not relax but it appears  that the abd was soft between breaths (panting)  Genitalia:    Normal female without lesion, discharge or tenderness   There is tenderness anteriorly but it is difficult to isolate the location. There appears to be significant tenderness over the symphysis pubis that exceeds the other areas of tenderness.    Extremities:   Extremities normal, atraumatic, no cyanosis or edema  Pulses:   2+ and symmetric all extremities  Skin:   Skin color, texture, turgor normal, no rashes or lesions       Assessment:  Pelvic pain- pt has a significant amount of involuntary guarding so it is difficult to feel anything on the bimanual exam.  Could not adequately assess for masses on the external or internal exam therefore a pelvic US will be ordered. I believe that most of her pain is coming form the symphysis pubis but, want to rule out pelvic masses first.  If the [name] is neg rec physical therapy to assess the pelvic girdle  specifically the pubic symphysis.       Plan:  Pelvic US today F/u in 1 week  If the [name] is WNL will refer to PT to eval and manage a pubic symphysitits.   Total face-to-face time with patient was 35 min.  Greater than 50% was spent in counseling and coordination of care with the patient.   Emony Dormer L. Harraway-Smith, M.D., Cherlynn June

## 2018-07-18 LAB — URINALYSIS
Bilirubin Urine: NEGATIVE
Hgb urine dipstick: NEGATIVE
Ketones, ur: NEGATIVE
Leukocytes,Ua: NEGATIVE
Nitrite: NEGATIVE
PH: 5.5 (ref 5.0–8.0)
Specific Gravity, Urine: 1.015 (ref 1.000–1.030)
Total Protein, Urine: NEGATIVE
Urine Glucose: NEGATIVE
Urobilinogen, UA: 0.2 (ref 0.0–1.0)

## 2018-07-18 LAB — URINE CULTURE
MICRO NUMBER:: 233309
Result:: NO GROWTH
SPECIMEN QUALITY: ADEQUATE

## 2018-07-19 ENCOUNTER — Encounter: Payer: Self-pay | Admitting: Family Medicine

## 2018-07-20 ENCOUNTER — Telehealth: Payer: Self-pay

## 2018-07-20 NOTE — Telephone Encounter (Signed)
Copied from [place] [id number]. Topic: General - Inquiry >> Jul 19, 2018 11:07 AM Rutherford Nail, NT wrote: Reason for CRM: Patient calling and states that Dr Charlett Blake mentioned doing another test, did not know the name of the test. Would like to know if Dr Charlett Blake has heard anything back about further testing? Please advise.    Please advise

## 2018-07-21 ENCOUNTER — Other Ambulatory Visit: Payer: Self-pay | Admitting: Family Medicine

## 2018-07-21 DIAGNOSIS — M25552 Pain in left hip: Secondary | ICD-10-CM

## 2018-07-21 NOTE — Telephone Encounter (Signed)
MRI ordered

## 2018-07-24 ENCOUNTER — Telehealth: Payer: Self-pay | Admitting: Family Medicine

## 2018-07-24 NOTE — Telephone Encounter (Signed)
Copied from [place] [id number]. Topic: Quick Communication - See Telephone Encounter >> Jul 24, 2018  5:11 PM Blase Mess A wrote: CRM for notification. See Telephone encounter for: 07/24/18.  Patient is calling to request the MRI-[employer] has not received it.  Requesting the MRI to be re-ordered Please advise [phone]

## 2018-07-25 ENCOUNTER — Other Ambulatory Visit: Payer: Self-pay | Admitting: Medical

## 2018-07-25 NOTE — Telephone Encounter (Signed)
Referral was just on Friday. Patient has to give it some time for them to be worked up and approved by her [employer]

## 2018-07-25 NOTE — Telephone Encounter (Signed)
Gwen, Can you look into this MRI thanks

## 2018-07-26 ENCOUNTER — Ambulatory Visit (INDEPENDENT_AMBULATORY_CARE_PROVIDER_SITE_OTHER): Payer: Medicare HMO | Admitting: Obstetrics & Gynecology

## 2018-07-26 VITALS — BP 121/59 | HR 56 | Ht 59.0 in | Wt 125.0 lb

## 2018-07-26 DIAGNOSIS — R102 Pelvic and perineal pain: Secondary | ICD-10-CM

## 2018-07-26 NOTE — Telephone Encounter (Signed)
Please advise I can not get the MRI approved due to medical necessity, would you like me to process it and you do the peer to peer?

## 2018-07-26 NOTE — Progress Notes (Signed)
Patient states she has some relief from pelvic pain by using crutches to walk. Patient states she is unable to walk far without pain. Kathrene Alu RN

## 2018-07-26 NOTE — Telephone Encounter (Signed)
Yes I will have to do a peer to peer. Please let patient know I am working on it and get me the numbers I need. Thanks

## 2018-07-26 NOTE — Progress Notes (Signed)
History:  78 y.o. Julie Wilkerson here today for reeval of pelvic pain. Pt reports that her pain has worsened and she is now on crutches. She was supposed to get an injection by ortho however, her appt was cancelled because she was on atbx. She has not been able to reach them since that time. Her pain remians in her back and in her pelvic bones.    The following portions of the patient's history were reviewed and updated as appropriate: allergies, current medications, past family history, past medical history, past social history, past surgical history and problem list.  Review of Systems:  Pertinent items are noted in HPI.    Objective:  Physical Exam Blood pressure (!) 121/59, pulse (!) 56, height 4\' 11"  (1.499 m), weight 125 lb (56.7 kg).  CONSTITUTIONAL: Well-developed, well-nourished female today walking with crutches.  Sits and stands with difficulty HENT:  Normocephalic, atraumatic EYES: Conjunctivae and EOM are normal. No scleral icterus.  NECK: Normal range of motion SKIN: Skin is warm and dry. No rash noted. Not diaphoretic.No pallor. [place]: Alert and oriented to person, place, and time. Normal coordination.    Labs and Imaging Mr Lumbar Spine Wo Contrast  Result Date: 07/08/2018 CLINICAL DATA:  Left lower quadrant abdominal pain, back pain, and left pelvic pain for 1 month. Left leg pain and weakness. EXAM: MRI LUMBAR SPINE WITHOUT CONTRAST TECHNIQUE: Multiplanar, multisequence MR imaging of the lumbar spine was performed. No intravenous contrast was administered. COMPARISON:  04/08/2018 FINDINGS: Segmentation:  Standard. Alignment: Mild lumbar levoscoliosis. Unchanged trace retrolisthesis of L2 on L3 and trace anterolisthesis of L3 on L4 and L4 on L5. Vertebrae: No fracture or suspicious osseous lesion. Mildly increased left-sided endplate edema at H8-E9, likely degenerative. Conus medullaris and cauda equina: Conus extends to the L1 level. Conus and cauda equina appear normal.  Paraspinal and other soft tissues: Unremarkable. Disc levels: Disc desiccation and moderate disc space narrowing throughout the lumbar spine. T12-L1: Mild right-sided facet and ligamentum flavum hypertrophy without disc herniation or stenosis, unchanged. L1-2: Mild disc bulging and mild facet and ligamentum flavum hypertrophy without stenosis, unchanged. L2-3: Mild disc bulging and mild facet and ligamentum flavum hypertrophy without stenosis, unchanged. L3-4: Disc bulging, small right subarticular disc protrusion/extrusion, and moderate right and mild left facet and ligamentum flavum hypertrophy result in mild-to-moderate spinal stenosis, mild-to-moderate right lateral recess stenosis, and mild right neural foraminal stenosis, unchanged. Potential right L4 nerve root impingement. L4-5: Circumferential disc bulging and moderate facet and ligamentum flavum hypertrophy result in moderate spinal stenosis, moderate bilateral lateral recess stenosis, and mild bilateral neural foraminal stenosis, unchanged. Potential bilateral L5 nerve root impingement. L5-S1: Mild disc bulging asymmetric to the left, mild right and moderate left facet hypertrophy, and ligamentum flavum hypertrophy with focal left lateral ligamentum flavum calcification or spurring result in moderate left lateral recess and mild left neural foraminal stenosis, unchanged. Potential left S1 nerve root impingement. No spinal stenosis. 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 Potential left-sided neural impingement at L4-5 and L5-S1. Electronically Signed   By: Logan Bores M.D.   On: 07/08/2018 15:02   US Transvaginal Non-ob  Result Date: 07/18/2018 CLINICAL DATA:  Pelvic pain x6 weeks EXAM: TRANSABDOMINAL AND TRANSVAGINAL ULTRASOUND OF PELVIS TECHNIQUE: Both transabdominal and transvaginal ultrasound examinations of the pelvis were performed. Transabdominal  technique was performed for global imaging of the pelvis including uterus, ovaries, adnexal regions, and pelvic cul-de-sac. It was necessary to  proceed with endovaginal exam following the transabdominal exam to visualize the bilateral adnexa. COMPARISON:  CT abdomen/pelvis dated 12/23/2017 FINDINGS: Uterus Surgically absent. Right ovary Measurements: 2.1 x 1.2 x 1.4 cm = volume: 1.8 mL. Only visualized transabdominally. Grossly unremarkable. Left ovary Measurements: 2.3 x 1.2 x 1.6 cm = volume: 2.3 mL. Only visualized transabdominally. Grossly unremarkable. Other findings No abnormal free fluid. IMPRESSION: Status post hysterectomy. Bilateral ovaries are grossly unremarkable. Electronically Signed   By: Julian Hy M.D.   On: 07/18/2018 08:38   US Pelvis Complete  Result Date: 07/18/2018 CLINICAL DATA:  Pelvic pain x6 weeks EXAM: TRANSABDOMINAL AND TRANSVAGINAL ULTRASOUND OF PELVIS TECHNIQUE: Both transabdominal and transvaginal ultrasound examinations of the pelvis were performed. Transabdominal technique was performed for global imaging of the pelvis including uterus, ovaries, adnexal regions, and pelvic cul-de-sac. It was necessary to proceed with endovaginal exam following the transabdominal exam to visualize the bilateral adnexa. COMPARISON:  CT abdomen/pelvis dated 12/23/2017 FINDINGS: Uterus Surgically absent. Right ovary Measurements: 2.1 x 1.2 x 1.4 cm = volume: 1.8 mL. Only visualized transabdominally. Grossly unremarkable. Left ovary Measurements: 2.3 x 1.2 x 1.6 cm = volume: 2.3 mL. Only visualized transabdominally. Grossly unremarkable. Other findings No abnormal free fluid. IMPRESSION: Status post hysterectomy. Bilateral ovaries are grossly unremarkable. Electronically Signed   By: Julian Hy M.D.   On: 07/18/2018 08:38   Dg Hip Unilat With Pelvis 2-3 Views Left  Result Date: 06/27/2018 CLINICAL DATA:  Lt groin pain x10days. EXAM: DG HIP (WITH OR WITHOUT PELVIS) 2-3V LEFT  COMPARISON:  None. FINDINGS: Degenerative changes are identified in the LOWER lumbar spine. Partially imaged lumbar scoliosis. There are degenerative changes of the LEFT hip. There is no acute fracture or subluxation. Visualized bowel gas pattern is nonobstructive. IMPRESSION: 1.  No evidence for acute  abnormality. 2. Degenerative changes in the LOWER lumbar spine and LEFT hip. Electronically Signed   By: Nolon Nations M.D.   On: 06/27/2018 15:34    Assessment & Plan:  Pelvic and back pain  No GYN etiology found  Reviewed Pelvic US  Rec f/u with Ortho. Pt is supposed to get scheudled for an MRI by her  primary care provider. She may be able to get that [facility] as [facility].  F/u in 3 months or sooner prn  Pt has f/u appt with Dr. Randel Pigg in 1 week  Total face-to-face time with patient was 20 min.  Greater than 50% was spent in counseling and coordination of care with the patient.   Artie Takayama L. Harraway-Smith, M.D., Cherlynn June

## 2018-07-27 ENCOUNTER — Encounter: Payer: Self-pay | Admitting: Obstetrics & Gynecology

## 2018-07-27 ENCOUNTER — Ambulatory Visit: Payer: Self-pay | Admitting: Neurology

## 2018-07-27 DIAGNOSIS — M25552 Pain in left hip: Secondary | ICD-10-CM | POA: Diagnosis not present

## 2018-07-27 DIAGNOSIS — R102 Pelvic and perineal pain: Secondary | ICD-10-CM | POA: Diagnosis not present

## 2018-07-27 NOTE — Telephone Encounter (Signed)
Spoke with patient and let her know about the delay she voiced her understanding.

## 2018-07-28 ENCOUNTER — Telehealth: Payer: Self-pay

## 2018-07-28 NOTE — Telephone Encounter (Signed)
Left message for patient. Just checking to see how her orthopedics appointment went. Kathrene Alu RN

## 2018-08-01 DIAGNOSIS — M5136 Other intervertebral disc degeneration, lumbar region: Secondary | ICD-10-CM

## 2018-08-01 DIAGNOSIS — M51369 Other intervertebral disc degeneration, lumbar region without mention of lumbar back pain or lower extremity pain: Secondary | ICD-10-CM | POA: Insufficient documentation

## 2018-08-01 HISTORY — DX: Other intervertebral disc degeneration, lumbar region: M51.36

## 2018-08-01 HISTORY — DX: Other intervertebral disc degeneration, lumbar region without mention of lumbar back pain or lower extremity pain: M51.369

## 2018-08-01 NOTE — Telephone Encounter (Signed)
Julie Wilkerson, How does patient go about getting her MRI scheduled

## 2018-08-05 ENCOUNTER — Other Ambulatory Visit: Payer: Self-pay

## 2018-08-05 ENCOUNTER — Ambulatory Visit (HOSPITAL_BASED_OUTPATIENT_CLINIC_OR_DEPARTMENT_OTHER)
Admission: RE | Admit: 2018-08-05 | Discharge: 2018-08-05 | Disposition: A | Discharge status: Home / Self Care | Payer: Medicare HMO | Source: Ambulatory Visit | Attending: Family Medicine | Admitting: Family Medicine

## 2018-08-05 DIAGNOSIS — M1612 Unilateral primary osteoarthritis, left hip: Secondary | ICD-10-CM | POA: Diagnosis not present

## 2018-08-05 DIAGNOSIS — M25552 Pain in left hip: Secondary | ICD-10-CM

## 2018-08-07 ENCOUNTER — Other Ambulatory Visit: Payer: Self-pay | Admitting: Family

## 2018-08-09 ENCOUNTER — Encounter: Payer: Self-pay | Admitting: Family Medicine

## 2018-08-10 ENCOUNTER — Ambulatory Visit: Payer: Medicare HMO | Admitting: Family Medicine

## 2018-08-11 DIAGNOSIS — M5136 Other intervertebral disc degeneration, lumbar region: Secondary | ICD-10-CM | POA: Diagnosis not present

## 2018-08-11 DIAGNOSIS — M25512 Pain in left shoulder: Secondary | ICD-10-CM

## 2018-08-11 DIAGNOSIS — R102 Pelvic and perineal pain: Secondary | ICD-10-CM | POA: Diagnosis not present

## 2018-08-11 HISTORY — DX: Pain in left shoulder: M25.512

## 2018-08-11 NOTE — Telephone Encounter (Signed)
Spoke with patient about her results, she voiced her understanding. Patient stated she is already set up with an orthopedist at this time

## 2018-09-04 ENCOUNTER — Encounter: Payer: Self-pay | Admitting: Family Medicine

## 2018-09-04 ENCOUNTER — Other Ambulatory Visit: Payer: Self-pay

## 2018-09-04 ENCOUNTER — Ambulatory Visit (INDEPENDENT_AMBULATORY_CARE_PROVIDER_SITE_OTHER): Payer: Medicare HMO | Admitting: Family Medicine

## 2018-09-04 DIAGNOSIS — E039 Hypothyroidism, unspecified: Secondary | ICD-10-CM

## 2018-09-04 DIAGNOSIS — M25512 Pain in left shoulder: Secondary | ICD-10-CM

## 2018-09-04 DIAGNOSIS — J45991 Cough variant asthma: Secondary | ICD-10-CM

## 2018-09-04 DIAGNOSIS — M7072 Other bursitis of hip, left hip: Secondary | ICD-10-CM

## 2018-09-04 DIAGNOSIS — G8929 Other chronic pain: Secondary | ICD-10-CM

## 2018-09-04 DIAGNOSIS — E559 Vitamin D deficiency, unspecified: Secondary | ICD-10-CM

## 2018-09-04 DIAGNOSIS — I10 Essential (primary) hypertension: Secondary | ICD-10-CM

## 2018-09-04 HISTORY — DX: Other bursitis of hip, left hip: M70.72

## 2018-09-04 MED ORDER — METHYLPREDNISOLONE 4 MG PO TABS: ORAL_TABLET | ORAL | 0 refills | Status: DC

## 2018-09-04 MED ORDER — LEVOTHYROXINE SODIUM 50 MCG PO TABS: 50.0000 ug | ORAL_TABLET | Freq: Every day | ORAL | 1 refills | Status: DC

## 2018-09-04 MED ORDER — HYOSCYAMINE SULFATE 0.125 MG SL SUBL: 0.1250 mg | SUBLINGUAL_TABLET | SUBLINGUAL | 1 refills | Status: DC | PRN

## 2018-09-04 MED ORDER — BUDESONIDE-FORMOTEROL FUMARATE 80-4.5 MCG/ACT IN AERO: 2.0000 | INHALATION_SPRAY | Freq: Two times a day (BID) | RESPIRATORY_TRACT | 1 refills | Status: DC

## 2018-09-04 MED ORDER — LISINOPRIL 20 MG PO TABS: 20.0000 mg | ORAL_TABLET | Freq: Every day | ORAL | 3 refills | Status: DC

## 2018-09-04 MED ORDER — METOPROLOL SUCCINATE ER 25 MG PO TB24: 25.0000 mg | ORAL_TABLET | Freq: Every day | ORAL | 11 refills | Status: DC

## 2018-09-04 MED ORDER — CITALOPRAM HYDROBROMIDE 20 MG PO TABS: ORAL_TABLET | ORAL | 1 refills | Status: DC

## 2018-09-04 NOTE — Assessment & Plan Note (Signed)
Well controlled, no changes to meds. Encouraged heart healthy diet such as the DASH diet and exercise as tolerated. Patient checking 115-120 noted

## 2018-09-04 NOTE — Assessment & Plan Note (Signed)
On Levothyroxine, continue to monitor, refill given

## 2018-09-04 NOTE — Assessment & Plan Note (Signed)
Supplement and monitor 

## 2018-09-04 NOTE — Assessment & Plan Note (Signed)
Had a steroid shot in her shoulder with Dr Gladstone Lighter of ortho and that has

## 2018-09-04 NOTE — Assessment & Plan Note (Signed)
No c/o GI disturbance with today's visot

## 2018-09-04 NOTE — Assessment & Plan Note (Signed)
Had improvement of left leg pain with initial steroid injection placed by Dr Nelva Bush of pain med/ortho but pain in lateral hip persisted. A Prednisone burst given byDr Gioffre helped some but not completely. She did feel strange in the head so she is not sure if she wants to take again. Encouraged Tylenol bid to tid, lidocaine patches daily and if pain worsens or persists is given a slow Medrol dose pak taper to try reevaluate in 4-6 weeks or as needed

## 2018-09-04 NOTE — Assessment & Plan Note (Signed)
Improved with regular use of Symbicort. No changes to therapy today

## 2018-09-04 NOTE — Progress Notes (Signed)
Virtual Visit via Video Note  I connected with Julie Wilkerson on 09/04/18 at 10:00 AM EDT by a video enabled telemedicine application and verified that I am speaking with the correct person using two identifiers.   I discussed the limitations of evaluation and management by telemedicine and the availability of in person appointments. The patient expressed understanding and agreed to proceed. [name], CMA was able to get patient set up on video platform    Subjective:    Patient ID: Julie Wilkerson, female    DOB: 1940/12/23, 78 y.o.   MRN: [id number]  No chief complaint on file.   HPI Patient is in today for follow up on numerous concerns. Her left leg pain is improved but her lateral left hip pain persists. Had improvement of left leg pain with initial steroid injection placed by Dr Nelva Bush of pain med/ortho but pain in lateral hip persisted. A Prednisone burst given byDr Gioffre helped some but not completely. She did feel strange in the head so she is not sure if she wants to take again. Her breathing was getting worse but she started using her Symbicort regularly and that has improved. BP well controlled. No c/o recent febrile illness or hospitalizations. Denies CP/palp/SOB/HA/congestion/fevers/GI or GU c/o. Taking meds as prescribed.  Past Medical History:  Diagnosis Date  . Asthma   . Breast cancer ([place])    2002, left, encapsulated, microcalcifications. lumpectomy, radtiation x 30  . Breast cancer in female [facility])   . Change in mole 06/08/2016  . Colitis   . Decreased visual acuity 01/16/2017  . Dyspnea 11/04/2016  . Dysuria 11/04/2016  . Gluten intolerance   . History of chicken pox   . History of Helicobacter pylori infection 08/01/2015  . Hyperlipidemia   . Hypertension   . Hypothyroid   . IBS (irritable bowel syndrome)   . Left leg pain 03/01/2017  . Low back pain 08/10/2015  . Neck pain 03/01/2017  . Osteoporosis   . Personal history of radiation therapy   . RLQ  discomfort 03/01/2017  . Stomach cramps   . Urinary tract infection 11/04/2016    Past Surgical History:  Procedure Laterality Date  . ABDOMINAL HYSTERECTOMY     partial  . APPENDECTOMY    . BREAST LUMPECTOMY Left 2002  . CHOLECYSTECTOMY  Age 27 or 14  . COLONOSCOPY  2017  . EYE SURGERY Left    cataract  . FRACTURE SURGERY    . TONSILLECTOMY      Family History  Problem Relation Age of Onset  . Colitis Mother   . Irritable bowel syndrome Mother   . Hypertension Father   . Hyperlipidemia Brother   . Heart attack Brother   . Stomach cancer Paternal Grandmother 6  . Hyperlipidemia Brother   . Heart attack Brother   . Hyperlipidemia Brother   . Heart attack Brother   . Hyperlipidemia Sister   . Leukemia Daughter   . Arthritis Daughter   . Heart disease Daughter        ASD vs VSD  . Colon cancer Neg Hx     Social History   Socioeconomic History  . Marital status: Single    Spouse name: Not on file  . Number of children: Not on file  . Years of education: Not on file  . Highest education level: Not on file  Occupational History  . Occupation: retired    Comment: Pharmacist, hospital, kindergarten  Social Needs  . Financial resource strain: Not  on file  . Food insecurity:    Worry: Not on file    Inability: Not on file  . Transportation needs:    Medical: Not on file    Non-medical: Not on file  Tobacco Use  . Smoking status: Never Smoker  . Smokeless tobacco: Never Used  Substance and Sexual Activity  . Alcohol use: No    Alcohol/week: 0.0 standard drinks  . Drug use: No  . Sexual activity: Not Currently    Birth control/protection: None    Comment: lives alone, avoids daiiry and gluten. 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 Physical activity:    Days per week: Not on file    Minutes per session: Not on file  . Stress: Not on file  Relationships  . Social connections:    Talks on phone: Not on file    Gets together: Not on file    Attends religious service:  Not on file    Active member of club or organization: Not on file    Attends meetings of clubs or organizations: Not on file    Relationship status: Not on file  . Intimate partner violence:    Fear of current or ex partner: Not on file    Emotionally abused: Not on file    Physically abused: Not on file    Forced sexual activity: Not on file  Other Topics Concern  . Not on file  Social History Narrative  . Not on file    Outpatient Medications Prior to Visit  Medication Sig Dispense Refill  . aspirin 81 MG tablet Take 81 mg by mouth daily.    . beclomethasone (QVAR REDIHALER) 80 MCG/ACT inhaler Inhale 2 puffs into the lungs 2 (two) times daily. 1 Inhaler 0  . Collagen-Vitamin C (COLLAGEN PLUS VITAMIN C PO) Take 1 tablet by mouth daily.    . Multiple Vitamins-Minerals (MULTIVITAMIN ADULT PO) Take 1 tablet by mouth daily.    . primidone (MYSOLINE) 50 MG tablet 1/2 tablet for 7 days, then 1 tablet daily (Patient not taking: Reported on 07/17/2018) 90 tablet 1  . Probiotic Product (PROBIOTIC DAILY PO) Take by mouth daily.    . citalopram (CELEXA) 20 MG tablet TAKE 1 & 1/2 (ONE & ONE-HALF) TABLETS BY MOUTH ONCE DAILY 135 tablet 0  . levothyroxine (SYNTHROID, LEVOTHROID) 50 MCG tablet Take 1 tablet by mouth once daily 90 tablet 0  . lisinopril (PRINIVIL,ZESTRIL) 20 MG tablet Take 1 tablet (20 mg total) by mouth daily. 90 tablet 3  . metoprolol succinate (TOPROL-XL) 25 MG 24 hr tablet Take 1 tablet (25 mg total) by mouth daily. 30 tablet 11   No facility-administered medications prior to visit.     No Known Allergies  Review of Systems  Constitutional: Negative for fever and malaise/fatigue.  HENT: Negative for congestion.   Eyes: Negative for blurred vision.  Respiratory: Negative for shortness of breath.   Cardiovascular: Negative for chest pain, palpitations and leg swelling.  Gastrointestinal: Negative for abdominal pain, blood in stool and nausea.  Genitourinary: Negative for  dysuria and frequency.  Musculoskeletal: Positive for joint pain and myalgias. Negative for falls.  Skin: Negative for rash.  Neurological: Negative for dizziness, loss of consciousness and headaches.  Endo/Heme/Allergies: Negative for environmental allergies.  Psychiatric/Behavioral: Negative for depression. The patient is nervous/anxious.        Objective:    Physical Exam Constitutional:      Appearance: Normal appearance. She is not ill-appearing.  HENT:  Head: Normocephalic and atraumatic.     Nose: Nose normal.  Pulmonary:     Effort: Pulmonary effort is normal.  Neurological:     Mental Status: She is alert and oriented to person, place, and time.  Psychiatric:        Mood and Affect: Mood normal.        Behavior: Behavior normal.     There were no vitals taken for this visit. Wt Readings from Last 3 Encounters:  07/26/18 125 lb (56.7 kg)  07/17/18 125 lb (56.7 kg)  07/17/18 125 lb 1.3 oz (56.7 kg)    Diabetic Foot Exam - Simple   No data filed     Lab Results  Component Value Date   WBC 7.1 06/27/2018   HGB 11.5 (L) 06/27/2018   HCT 35.1 (L) 06/27/2018   PLT 257.0 06/27/2018   GLUCOSE 93 06/27/2018   CHOL 237 (H) 05/04/2018   TRIG 189.0 (H) 05/04/2018   HDL 61.00 05/04/2018   LDLCALC 138 (H) 05/04/2018   ALT 20 06/27/2018   AST 21 06/27/2018   NA 138 06/27/2018   K 4.3 06/27/2018   CL 103 06/27/2018   CREATININE 1.07 06/27/2018   BUN 27 (H) 06/27/2018   CO2 28 06/27/2018   TSH 1.65 05/04/2018   HGBA1C 6.0 12/22/2017    Lab Results  Component Value Date   TSH 1.65 05/04/2018   Lab Results  Component Value Date   WBC 7.1 06/27/2018   HGB 11.5 (L) 06/27/2018   HCT 35.1 (L) 06/27/2018   MCV 89.4 06/27/2018   PLT 257.0 06/27/2018   Lab Results  Component Value Date   NA 138 06/27/2018   K 4.3 06/27/2018   CO2 28 06/27/2018   GLUCOSE 93 06/27/2018   BUN 27 (H) 06/27/2018   CREATININE 1.07 06/27/2018   BILITOT 0.6 06/27/2018    ALKPHOS 48 06/27/2018   AST 21 06/27/2018   ALT 20 06/27/2018   PROT 6.6 06/27/2018   ALBUMIN 4.0 06/27/2018   CALCIUM 9.3 06/27/2018   ANIONGAP 7 06/08/2018   GFR 49.78 (L) 06/27/2018   Lab Results  Component Value Date   CHOL 237 (H) 05/04/2018   Lab Results  Component Value Date   HDL 61.00 05/04/2018   Lab Results  Component Value Date   LDLCALC 138 (H) 05/04/2018   Lab Results  Component Value Date   TRIG 189.0 (H) 05/04/2018   Lab Results  Component Value Date   CHOLHDL 4 05/04/2018   Lab Results  Component Value Date   HGBA1C 6.0 12/22/2017       Assessment & Plan:   Problem List Items Addressed This Visit    Cough variant asthma  vs UACS from ACEi     Improved with regular use of Symbicort. No changes to therapy today      Relevant Medications   budesonide-formoterol (SYMBICORT) 80-4.5 MCG/ACT inhaler   methylPREDNISolone (MEDROL) 4 MG tablet   Essential hypertension    Well controlled, no changes to meds. Encouraged heart healthy diet such as the DASH diet and exercise as tolerated. Patient checking 115-120 noted      Relevant Medications   lisinopril (PRINIVIL,ZESTRIL) 20 MG tablet   metoprolol succinate (TOPROL-XL) 25 MG 24 hr tablet   Hypothyroid    On Levothyroxine, continue to monitor, refill given      Relevant Medications   metoprolol succinate (TOPROL-XL) 25 MG 24 hr tablet   levothyroxine (SYNTHROID, LEVOTHROID) 50 MCG tablet  Vitamin D deficiency    Supplement and monitor      Left shoulder pain    Had a steroid shot in her shoulder with Dr Gladstone Lighter of ortho and that has       Bursitis of left hip    Had improvement of left leg pain with initial steroid injection placed by Dr Nelva Bush of pain med/ortho but pain in lateral hip persisted. A Prednisone burst given byDr Gioffre helped some but not completely. She did feel strange in the head so she is not sure if she wants to take again. Encouraged Tylenol bid to tid, lidocaine patches  daily and if pain worsens or persists is given a slow Medrol dose pak taper to try reevaluate in 4-6 weeks or as needed         I have changed Julie Wilkerson's levothyroxine, citalopram, and hyoscyamine. I am also having her start on methylPREDNISolone. Additionally, I am having her maintain her Multiple Vitamins-Minerals (MULTIVITAMIN ADULT PO), Collagen-Vitamin C (COLLAGEN PLUS VITAMIN C PO), aspirin, beclomethasone, Probiotic Product (PROBIOTIC DAILY PO), primidone, lisinopril, metoprolol succinate, and budesonide-formoterol.  Meds ordered this encounter  Medications  . lisinopril (PRINIVIL,ZESTRIL) 20 MG tablet    Sig: Take 1 tablet (20 mg total) by mouth daily.    Dispense:  90 tablet    Refill:  3  . metoprolol succinate (TOPROL-XL) 25 MG 24 hr tablet    Sig: Take 1 tablet (25 mg total) by mouth daily.    Dispense:  30 tablet    Refill:  11  . levothyroxine (SYNTHROID, LEVOTHROID) 50 MCG tablet    Sig: Take 1 tablet (50 mcg total) by mouth daily.    Dispense:  90 tablet    Refill:  1  . citalopram (CELEXA) 20 MG tablet    Sig: TAKE 1  TABLET BY MOUTH ONCE DAILY    Dispense:  90 tablet    Refill:  1  . budesonide-formoterol (SYMBICORT) 80-4.5 MCG/ACT inhaler    Sig: Inhale 2 puffs into the lungs 2 (two) times daily.    Dispense:  1 Inhaler    Refill:  1  . hyoscyamine (LEVSIN SL) 0.125 MG SL tablet    Sig: Place 1 tablet (0.125 mg total) under the tongue every 4 (four) hours as needed for cramping.    Dispense:  30 tablet    Refill:  1  . methylPREDNISolone (MEDROL) 4 MG tablet    Sig: 4 tab po qd X 3d then 3 tab po qd X 3d then 2 tab po qd x 3d then 1 tab po qd x 3d    Dispense:  15 tablet    Refill:  0       I discussed the assessment and treatment plan with the patient. The patient was provided an opportunity to ask questions and all were answered. The patient agreed with the plan and demonstrated an understanding of the instructions.   The patient was advised to  call back or seek an in-person evaluation if the symptoms worsen or if the condition fails to improve as anticipated.  I provided 25 minutes of non-face-to-face time during this encounter.   Penni Homans, MD

## 2018-09-05 ENCOUNTER — Other Ambulatory Visit: Payer: Self-pay | Admitting: Family Medicine

## 2018-09-05 MED ORDER — SYMBICORT 80-4.5 MCG/ACT IN AERO: 2.0000 | INHALATION_SPRAY | Freq: Two times a day (BID) | RESPIRATORY_TRACT | 1 refills | Status: DC

## 2018-09-06 ENCOUNTER — Telehealth: Payer: Self-pay | Admitting: Family Medicine

## 2018-09-06 NOTE — Telephone Encounter (Signed)
Called to schedule follow up appointment 3-6 weeks out from last visit of 09/04/18. LVM for patient to call back to schedule.

## 2018-09-11 DIAGNOSIS — Z6826 Body mass index (BMI) 26.0-26.9, adult: Secondary | ICD-10-CM | POA: Diagnosis not present

## 2018-09-11 DIAGNOSIS — R0602 Shortness of breath: Secondary | ICD-10-CM | POA: Diagnosis not present

## 2018-09-11 DIAGNOSIS — J9801 Acute bronchospasm: Secondary | ICD-10-CM | POA: Diagnosis not present

## 2018-09-11 DIAGNOSIS — Z7951 Long term (current) use of inhaled steroids: Secondary | ICD-10-CM | POA: Diagnosis not present

## 2018-09-11 DIAGNOSIS — I10 Essential (primary) hypertension: Secondary | ICD-10-CM | POA: Diagnosis not present

## 2018-09-19 ENCOUNTER — Other Ambulatory Visit: Payer: Self-pay | Admitting: Neurology

## 2018-09-19 MED ORDER — PRIMIDONE 50 MG PO TABS: 50.0000 mg | ORAL_TABLET | Freq: Every day | ORAL | 1 refills | Status: DC

## 2018-09-21 DIAGNOSIS — R0602 Shortness of breath: Secondary | ICD-10-CM | POA: Diagnosis not present

## 2018-09-21 DIAGNOSIS — R69 Illness, unspecified: Secondary | ICD-10-CM | POA: Diagnosis not present

## 2018-09-21 DIAGNOSIS — J9801 Acute bronchospasm: Secondary | ICD-10-CM | POA: Diagnosis not present

## 2018-10-02 ENCOUNTER — Other Ambulatory Visit: Payer: Self-pay | Admitting: Orthopedic Surgery

## 2018-10-02 DIAGNOSIS — M5136 Other intervertebral disc degeneration, lumbar region: Secondary | ICD-10-CM

## 2018-10-02 DIAGNOSIS — M25552 Pain in left hip: Secondary | ICD-10-CM | POA: Diagnosis not present

## 2018-10-02 LAB — HM DIABETES EYE EXAM

## 2018-10-10 ENCOUNTER — Encounter: Payer: Self-pay | Admitting: Family Medicine

## 2018-10-10 ENCOUNTER — Other Ambulatory Visit: Payer: Self-pay | Admitting: Family Medicine

## 2018-10-10 DIAGNOSIS — M545 Low back pain, unspecified: Secondary | ICD-10-CM

## 2018-10-13 ENCOUNTER — Encounter: Payer: Self-pay | Admitting: Family Medicine

## 2018-10-17 DIAGNOSIS — M1612 Unilateral primary osteoarthritis, left hip: Secondary | ICD-10-CM | POA: Diagnosis not present

## 2018-10-19 ENCOUNTER — Telehealth: Payer: Self-pay | Admitting: Family Medicine

## 2018-10-19 ENCOUNTER — Ambulatory Visit (INDEPENDENT_AMBULATORY_CARE_PROVIDER_SITE_OTHER): Payer: Medicare HMO | Admitting: Family Medicine

## 2018-10-19 ENCOUNTER — Other Ambulatory Visit: Payer: Self-pay

## 2018-10-19 DIAGNOSIS — E559 Vitamin D deficiency, unspecified: Secondary | ICD-10-CM

## 2018-10-19 DIAGNOSIS — R739 Hyperglycemia, unspecified: Secondary | ICD-10-CM | POA: Diagnosis not present

## 2018-10-19 DIAGNOSIS — I10 Essential (primary) hypertension: Secondary | ICD-10-CM | POA: Diagnosis not present

## 2018-10-19 DIAGNOSIS — R197 Diarrhea, unspecified: Secondary | ICD-10-CM | POA: Diagnosis not present

## 2018-10-19 DIAGNOSIS — E039 Hypothyroidism, unspecified: Secondary | ICD-10-CM

## 2018-10-19 DIAGNOSIS — E782 Mixed hyperlipidemia: Secondary | ICD-10-CM | POA: Diagnosis not present

## 2018-10-19 DIAGNOSIS — J45991 Cough variant asthma: Secondary | ICD-10-CM

## 2018-10-19 NOTE — Telephone Encounter (Signed)
LVM to have patient call back to schedule 3 month f/u appointment from visit on 10/19/18

## 2018-10-19 NOTE — Assessment & Plan Note (Signed)
Encouraged heart healthy diet, increase exercise, avoid trans fats, consider a krill oil cap daily 

## 2018-10-19 NOTE — Assessment & Plan Note (Signed)
Was seen by pulmonology and started on Symbicort which was helpful but she cannot afford it willtry a trial of Flovent 110 mcg 1 puff po bid

## 2018-10-19 NOTE — Assessment & Plan Note (Signed)
Supplement and monitor 

## 2018-10-19 NOTE — Progress Notes (Signed)
Virtual Visit via Video Note  I connected with Julie Wilkerson on 10/19/18 at  9:00 AM EDT by a video enabled telemedicine application and verified that I am speaking with the correct person using two identifiers.  Location: Patient: home Provider: office   I discussed the limitations of evaluation and management by telemedicine and the availability of in person appointments. The patient expressed understanding and agreed to proceed. Julie Wilkerson was able to get patient set up on video visit.      Subjective:    Patient ID: Julie Wilkerson, female    DOB: March 28, 1941, 78 y.o.   MRN: [id number]  No chief complaint on file.   HPI Patient is in today for follow up on abdominal pain, diarrhea, cough, anxiety and more. No obvious fevers but some chills and fatigue noted. She was having several watery stool a day but is now improving. No bloody or tarry stool. Notes some intermittent abdominal pain most notably on lieft side. Has been seen by pulmonology and they started her on symbicort for cough variant asthma which has been very helpful but she cannot afford it so is asking for an alternative medication. She is following with Julie Wilkerson of [facility] for her left hip and leg pain and that continues to evolve. She has an appointment soon. Denies CP/palp/SOB/HA/congestion/fevers/GI or GU c/o. Taking meds as prescribed  Past Medical History:  Diagnosis Date  . Asthma   . Breast cancer ([place])    2002, left, encapsulated, microcalcifications. lumpectomy, radtiation x 30  . Breast cancer in female [facility])   . Change in mole 06/08/2016  . Colitis   . Decreased visual acuity 01/16/2017  . Dyspnea 11/04/2016  . Dysuria 11/04/2016  . Gluten intolerance   . History of chicken pox   . History of Helicobacter pylori infection 08/01/2015  . Hyperlipidemia   . Hypertension   . Hypothyroid   . IBS (irritable bowel syndrome)   . Left leg pain 03/01/2017  . Low back pain 08/10/2015  . Neck pain  03/01/2017  . Osteoporosis   . Personal history of radiation therapy   . RLQ discomfort 03/01/2017  . Stomach cramps   . Urinary tract infection 11/04/2016    Past Surgical History:  Procedure Laterality Date  . ABDOMINAL HYSTERECTOMY     partial  . APPENDECTOMY    . BREAST LUMPECTOMY Left 2002  . CHOLECYSTECTOMY  Age 23 or 44  . COLONOSCOPY  2017  . EYE SURGERY Left    cataract  . FRACTURE SURGERY    . TONSILLECTOMY      Family History  Problem Relation Age of Onset  . Colitis Mother   . Irritable bowel syndrome Mother   . Hypertension Father   . Hyperlipidemia Brother   . Heart attack Brother   . Stomach cancer Paternal Grandmother 87  . Hyperlipidemia Brother   . Heart attack Brother   . Hyperlipidemia Brother   . Heart attack Brother   . Hyperlipidemia Sister   . Leukemia Daughter   . Arthritis Daughter   . Heart disease Daughter        ASD vs VSD  . Colon cancer Neg Hx     Social History   Socioeconomic History  . Marital status: Single    Spouse name: Not on file  . Number of children: Not on file  . Years of education: Not on file  . Highest education level: Not on file  Occupational History  . Occupation:  retired    Comment: Pharmacist, hospital, kindergarten  Social Needs  . Financial resource strain: Not on file  . Food insecurity:    Worry: Not on file    Inability: Not on file  . Transportation needs:    Medical: Not on file    Non-medical: Not on file  Tobacco Use  . Smoking status: Never Smoker  . Smokeless tobacco: Never Used  Substance and Sexual Activity  . Alcohol use: No    Alcohol/week: 0.0 standard drinks  . Drug use: No  . Sexual activity: Not Currently    Birth control/protection: None    Comment: lives alone, avoids daiiry and gluten. 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 Physical activity:    Days per week: Not on file    Minutes per session: Not on file  . Stress: Not on file  Relationships  . Social connections:    Talks on  phone: Not on file    Gets together: Not on file    Attends religious service: Not on file    Active member of club or organization: Not on file    Attends meetings of clubs or organizations: Not on file    Relationship status: Not on file  . Intimate partner violence:    Fear of current or ex partner: Not on file    Emotionally abused: Not on file    Physically abused: Not on file    Forced sexual activity: Not on file  Other Topics Concern  . Not on file  Social History Narrative  . Not on file    Outpatient Medications Prior to Visit  Medication Sig Dispense Refill  . aspirin 81 MG tablet Take 81 mg by mouth daily.    . citalopram (CELEXA) 20 MG tablet TAKE 1  TABLET BY MOUTH ONCE DAILY 90 tablet 1  . Collagen-Vitamin C (COLLAGEN PLUS VITAMIN C PO) Take 1 tablet by mouth daily.    . hyoscyamine (LEVSIN SL) 0.125 MG SL tablet Place 1 tablet (0.125 mg total) under the tongue every 4 (four) hours as needed for cramping. 30 tablet 1  . levothyroxine (SYNTHROID, LEVOTHROID) 50 MCG tablet Take 1 tablet (50 mcg total) by mouth daily. 90 tablet 1  . lisinopril (PRINIVIL,ZESTRIL) 20 MG tablet Take 1 tablet (20 mg total) by mouth daily. 90 tablet 3  . metoprolol succinate (TOPROL-XL) 25 MG 24 hr tablet Take 1 tablet (25 mg total) by mouth daily. 30 tablet 11  . montelukast (SINGULAIR) 10 MG tablet     . Multiple Vitamins-Minerals (MULTIVITAMIN ADULT PO) Take 1 tablet by mouth daily.    . primidone (MYSOLINE) 50 MG tablet Take 1 tablet (50 mg total) by mouth at bedtime. 90 tablet 1  . Probiotic Product (PROBIOTIC DAILY PO) Take by mouth daily.    . beclomethasone (QVAR REDIHALER) 80 MCG/ACT inhaler Inhale 2 puffs into the lungs 2 (two) times daily. 1 Inhaler 0  . SYMBICORT 80-4.5 MCG/ACT inhaler Inhale 2 puffs into the lungs 2 (two) times daily. 1 Inhaler 1   No facility-administered medications prior to visit.     No Known Allergies  Review of Systems  Constitutional: Positive for  chills and malaise/fatigue. Negative for fever.  HENT: Negative for congestion.   Eyes: Negative for blurred vision.  Respiratory: Negative for shortness of breath.   Cardiovascular: Negative for chest pain, palpitations and leg swelling.  Gastrointestinal: Positive for abdominal pain, diarrhea and nausea. Negative for blood in stool.  Genitourinary: Negative for dysuria  and frequency.  Musculoskeletal: Positive for joint pain. Negative for falls.  Skin: Negative for rash.  Neurological: Negative for dizziness, loss of consciousness and headaches.  Endo/Heme/Allergies: Negative for environmental allergies.  Psychiatric/Behavioral: Negative for depression. The patient is not nervous/anxious.        Objective:    Physical Exam Constitutional:      Appearance: Normal appearance.  HENT:     Head: Normocephalic and atraumatic.     Nose: Nose normal.  Eyes:     General:        Right eye: No discharge.        Left eye: No discharge.  Pulmonary:     Effort: Pulmonary effort is normal.  Neurological:     Mental Status: She is alert and oriented to person, place, and time.  Psychiatric:        Mood and Affect: Mood normal.        Behavior: Behavior normal.     BP 136/64 (BP Location: Left Arm, Patient Position: Sitting, Cuff Size: Normal)   Pulse 62   Wt 123 lb (55.8 kg)   BMI 24.84 kg/m  Wt Readings from Last 3 Encounters:  10/19/18 123 lb (55.8 kg)  07/26/18 125 lb (56.7 kg)  07/17/18 125 lb (56.7 kg)    Diabetic Foot Exam - Simple   No data filed     Lab Results  Component Value Date   WBC 7.1 06/27/2018   HGB 11.5 (L) 06/27/2018   HCT 35.1 (L) 06/27/2018   PLT 257.0 06/27/2018   GLUCOSE 93 06/27/2018   CHOL 237 (H) 05/04/2018   TRIG 189.0 (H) 05/04/2018   HDL 61.00 05/04/2018   LDLCALC 138 (H) 05/04/2018   ALT 20 06/27/2018   AST 21 06/27/2018   NA 138 06/27/2018   K 4.3 06/27/2018   CL 103 06/27/2018   CREATININE 1.07 06/27/2018   BUN 27 (H) 06/27/2018    CO2 28 06/27/2018   TSH 1.65 05/04/2018   HGBA1C 6.0 12/22/2017    Lab Results  Component Value Date   TSH 1.65 05/04/2018   Lab Results  Component Value Date   WBC 7.1 06/27/2018   HGB 11.5 (L) 06/27/2018   HCT 35.1 (L) 06/27/2018   MCV 89.4 06/27/2018   PLT 257.0 06/27/2018   Lab Results  Component Value Date   NA 138 06/27/2018   K 4.3 06/27/2018   CO2 28 06/27/2018   GLUCOSE 93 06/27/2018   BUN 27 (H) 06/27/2018   CREATININE 1.07 06/27/2018   BILITOT 0.6 06/27/2018   ALKPHOS 48 06/27/2018   AST 21 06/27/2018   ALT 20 06/27/2018   PROT 6.6 06/27/2018   ALBUMIN 4.0 06/27/2018   CALCIUM 9.3 06/27/2018   ANIONGAP 7 06/08/2018   GFR 49.78 (L) 06/27/2018   Lab Results  Component Value Date   CHOL 237 (H) 05/04/2018   Lab Results  Component Value Date   HDL 61.00 05/04/2018   Lab Results  Component Value Date   LDLCALC 138 (H) 05/04/2018   Lab Results  Component Value Date   TRIG 189.0 (H) 05/04/2018   Lab Results  Component Value Date   CHOLHDL 4 05/04/2018   Lab Results  Component Value Date   HGBA1C 6.0 12/22/2017       Assessment & Plan:   Problem List Items Addressed This Visit    Cough variant asthma  vs UACS from ACEi     Was seen by pulmonology and started on Symbicort  which was helpful but she cannot afford it willtry a trial of Flovent 110 mcg 1 puff po bid      Hyperlipidemia    Encouraged heart healthy diet, increase exercise, avoid trans fats, consider a krill oil cap daily      Essential hypertension    no changes to meds. Encouraged heart healthy diet such as the DASH diet and exercise as tolerated.       Hypothyroid    On Levothyroxine, continue to monitor      Diarrhea    Experienced 3 days of watery frequent stool, felt weak and tired, no fevers but some chilss. The past 2 days have been better. No bloody or tarry. Very malodorous. Did experience some nausea but no vomiting. Was able to drink liquids, encouraged to add  Gatorade. Did have some muscle cramps during this time but better now. Try Hyland's leg cramp med. Continues to have LLQ pain/hip pain/posterior hip pain today. She has Julie Wilkerson and he is planning on a shot in the hip next week. Still interested in PT will try and get her set up at [facility]. Encouraged brat diet      Vitamin D deficiency    Supplement and monitor      Hyperglycemia    hgba1c acceptable, minimize simple carbs. Increase exercise as tolerated.          I have discontinued Laurelai Lepp. Olivarez's beclomethasone and Symbicort. I am also having her maintain her Multiple Vitamins-Minerals (MULTIVITAMIN ADULT PO), Collagen-Vitamin C (COLLAGEN PLUS VITAMIN C PO), aspirin, Probiotic Product (PROBIOTIC DAILY PO), lisinopril, metoprolol succinate, levothyroxine, citalopram, hyoscyamine, primidone, and montelukast.  No orders of the defined types were placed in this encounter.  I discussed the assessment and treatment plan with the patient. The patient was provided an opportunity to ask questions and all were answered. The patient agreed with the plan and demonstrated an understanding of the instructions.   The patient was advised to call back or seek an in-person evaluation if th25e symptoms worsen or if the condition fails to improve as anticipated.  I provided 25 minutes of non-face-to-face time during this encounter.   Penni Homans, MD

## 2018-10-19 NOTE — Assessment & Plan Note (Signed)
On Levothyroxine, continue to monitor 

## 2018-10-19 NOTE — Patient Instructions (Signed)
Food Choices to Help Relieve Diarrhea, Adult  When you have diarrhea, the foods you eat and your eating habits are very important. Choosing the right foods and drinks can help:   Relieve diarrhea.   Replace lost fluids and nutrients.   Prevent dehydration.  What general guidelines should I follow?    Relieving diarrhea   Choose foods with less than 2 g or .07 oz. of fiber per serving.   Limit fats to less than 8 tsp (38 g or 1.34 oz.) a day.   Avoid the following:  ? Foods and beverages sweetened with high-fructose corn syrup, honey, or sugar alcohols such as xylitol, sorbitol, and mannitol.  ? Foods that contain a lot of fat or sugar.  ? Fried, greasy, or spicy foods.  ? High-fiber grains, breads, and cereals.  ? Raw fruits and vegetables.   Eat foods that are rich in probiotics. These foods include dairy products such as yogurt and fermented milk products. They help increase healthy bacteria in the stomach and intestines (gastrointestinal tract, or GI tract).   If you have lactose intolerance, avoid dairy products. These may make your diarrhea worse.   Take medicine to help stop diarrhea (antidiarrheal medicine) only as told by your health care provider.  Replacing nutrients   Eat small meals or snacks every 3-4 hours.   Eat bland foods, such as white rice, toast, or baked potato, until your diarrhea starts to get better. Gradually reintroduce nutrient-rich foods as tolerated or as told by your health care provider. This includes:  ? Well-cooked protein foods.  ? Peeled, seeded, and soft-cooked fruits and vegetables.  ? Low-fat dairy products.   Take vitamin and mineral supplements as told by your health care provider.  Preventing dehydration   Start by sipping water or a special solution to prevent dehydration (oral rehydration solution, ORS). Urine that is clear or pale yellow means that you are getting enough fluid.   Try to drink at least 8-10 cups of fluid each day to help replace lost  fluids.   You may add other liquids in addition to water, such as clear juice or decaffeinated sports drinks, as tolerated or as told by your health care provider.   Avoid drinks with caffeine, such as coffee, tea, or soft drinks.   Avoid alcohol.  What foods are recommended?         The items listed may not be a complete list. Talk with your health care provider about what dietary choices are best for you.  Grains  White rice. White, French, or pita breads (fresh or toasted), including plain rolls, buns, or bagels. White pasta. Saltine, soda, or graham crackers. Pretzels. Low-fiber cereal. Cooked cereals made with water (such as cornmeal, farina, or cream cereals). Plain muffins. Matzo. Melba toast. Zwieback.  Vegetables  Potatoes (without the skin). Most well-cooked and canned vegetables without skins or seeds. Tender lettuce.  Fruits  Apple sauce. Fruits canned in juice. Cooked apricots, cherries, grapefruit, peaches, pears, or plums. Fresh bananas and cantaloupe.  Meats and other protein foods  Baked or boiled chicken. Eggs. Tofu. Fish. Seafood. Smooth nut butters. Ground or well-cooked tender beef, ham, veal, lamb, pork, or poultry.  Dairy  Plain yogurt, kefir, and unsweetened liquid yogurt. Lactose-free milk, buttermilk, skim milk, or soy milk. Low-fat or nonfat hard cheese.  Beverages  Water. Low-calorie sports drinks. Fruit juices without pulp. Strained tomato and vegetable juices. Decaffeinated teas. Sugar-free beverages not sweetened with sugar alcohols. Oral rehydration solutions, if   approved by your health care provider.  Seasoning and other foods  Bouillon, broth, or soups made from recommended foods.  What foods are not recommended?  The items listed may not be a complete list. Talk with your health care provider about what dietary choices are best for you.  Grains  Whole grain, whole wheat, bran, or rye breads, rolls, pastas, and crackers. Wild or brown rice. Whole grain or bran cereals. Barley.  Oats and oatmeal. Corn tortillas or taco shells. Granola. Popcorn.  Vegetables  Raw vegetables. Fried vegetables. Cabbage, broccoli, Brussels sprouts, artichokes, baked beans, beet greens, corn, kale, legumes, peas, sweet potatoes, and yams. Potato skins. Cooked spinach and cabbage.  Fruits  Dried fruit, including raisins and dates. Raw fruits. Stewed or dried prunes. Canned fruits with syrup.  Meat and other protein foods  Fried or fatty meats. Deli meats. Chunky nut butters. Nuts and seeds. Beans and lentils. Bacon. Hot dogs. Sausage.  Dairy  High-fat cheeses. Whole milk, chocolate milk, and beverages made with milk, such as milk shakes. Half-and-half. Cream. sour cream. Ice cream.  Beverages  Caffeinated beverages (such as coffee, tea, soda, or energy drinks). Alcoholic beverages. Fruit juices with pulp. Prune juice. Soft drinks sweetened with high-fructose corn syrup or sugar alcohols. High-calorie sports drinks.  Fats and oils  Butter. Cream sauces. Margarine. Salad oils. Plain salad dressings. Olives. Avocados. Mayonnaise.  Sweets and desserts  Sweet rolls, doughnuts, and sweet breads. Sugar-free desserts sweetened with sugar alcohols such as xylitol and sorbitol.  Seasoning and other foods  Honey. Hot sauce. Chili powder. Gravy. Cream-based or milk-based soups. Pancakes and waffles.  Summary   When you have diarrhea, the foods you eat and your eating habits are very important.   Make sure you get at least 8-10 cups of fluid each day, or enough to keep your urine clear or pale yellow.   Eat bland foods and gradually reintroduce healthy, nutrient-rich foods as tolerated, or as told by your health care provider.   Avoid high-fiber, fried, greasy, or spicy foods.  This information is not intended to replace advice given to you by your health care provider. Make sure you discuss any questions you have with your health care provider.  Document Released: 07/31/2003 Document Revised: 05/07/2016 Document Reviewed:  05/07/2016  Elsevier Interactive Patient Education  2019 [employer]

## 2018-10-19 NOTE — Assessment & Plan Note (Signed)
hgba1c acceptable, minimize simple carbs. Increase exercise as tolerated.  

## 2018-10-19 NOTE — Assessment & Plan Note (Signed)
no changes to meds. Encouraged heart healthy diet such as the DASH diet and exercise as tolerated.  

## 2018-10-19 NOTE — Assessment & Plan Note (Addendum)
Experienced 3 days of watery frequent stool, felt weak and tired, no fevers but some chilss. The past 2 days have been better. No bloody or tarry. Very malodorous. Did experience some nausea but no vomiting. Was able to drink liquids, encouraged to add Gatorade. Did have some muscle cramps during this time but better now. Try Hyland's leg cramp med. Continues to have LLQ pain/hip pain/posterior hip pain today. She has Dr Nelva Bush and he is planning on a shot in the hip next week. Still interested in PT will try and get her set up at [facility]. Encouraged brat diet

## 2018-10-22 MED ORDER — FLUTICASONE PROPIONATE HFA 110 MCG/ACT IN AERO: 1.0000 | INHALATION_SPRAY | Freq: Two times a day (BID) | RESPIRATORY_TRACT | 12 refills | Status: DC

## 2018-10-23 ENCOUNTER — Encounter: Payer: Self-pay | Admitting: Family Medicine

## 2018-10-24 ENCOUNTER — Other Ambulatory Visit: Payer: Self-pay

## 2018-10-24 ENCOUNTER — Ambulatory Visit
Admission: RE | Admit: 2018-10-24 | Discharge: 2018-10-24 | Disposition: A | Discharge status: Home / Self Care | Payer: Medicare HMO | Source: Ambulatory Visit | Attending: Orthopedic Surgery | Admitting: Orthopedic Surgery

## 2018-10-24 DIAGNOSIS — M4316 Spondylolisthesis, lumbar region: Secondary | ICD-10-CM | POA: Diagnosis not present

## 2018-10-24 DIAGNOSIS — M4807 Spinal stenosis, lumbosacral region: Secondary | ICD-10-CM | POA: Diagnosis not present

## 2018-10-24 DIAGNOSIS — M5136 Other intervertebral disc degeneration, lumbar region: Secondary | ICD-10-CM

## 2018-10-24 MED ORDER — IOPAMIDOL (ISOVUE-M 200) INJECTION 41%
18.0000 mL | Freq: Once | INTRAMUSCULAR | Status: AC
  Administered 2018-10-24: 18 mL via INTRATHECAL

## 2018-10-24 MED ORDER — MEPERIDINE HCL 50 MG/ML IJ SOLN
50.0000 mg | Freq: Once | INTRAMUSCULAR | Status: AC
  Administered 2018-10-24: 50 mg via INTRAMUSCULAR

## 2018-10-24 MED ORDER — ONDANSETRON HCL 4 MG/2ML IJ SOLN
4.0000 mg | Freq: Once | INTRAMUSCULAR | Status: AC
  Administered 2018-10-24: 4 mg via INTRAMUSCULAR

## 2018-10-24 MED ORDER — DIAZEPAM 5 MG PO TABS
5.0000 mg | ORAL_TABLET | Freq: Once | ORAL | Status: AC
  Administered 2018-10-24: 5 mg via ORAL

## 2018-10-24 NOTE — Discharge Instructions (Signed)
Myelogram Discharge Instructions  1. Go home and rest quietly for the next 24 hours.  It is important to lie flat for the next 24 hours.  Get up only to go to the restroom.  You may lie in the bed or on a couch on your back, your stomach, your left side or your right side.  You may have one pillow under your head.  You may have pillows between your knees while you are on your side or under your knees while you are on your back.  2. DO NOT drive today.  Recline the seat as far back as it will go, while still wearing your seat belt, on the way home.  3. You may get up to go to the bathroom as needed.  You may sit up for 10 minutes to eat.  You may resume your normal diet and medications unless otherwise indicated.  Drink lots of extra fluids today and tomorrow.  4. The incidence of headache, nausea, or vomiting is about 5% (one in 20 patients).  If you develop a headache, lie flat and drink plenty of fluids until the headache goes away.  Caffeinated beverages may be helpful.  If you develop severe nausea and vomiting or a headache that does not go away with flat bed rest, call [phone].  5. You may resume normal activities after your 24 hours of bed rest is over; however, do not exert yourself strongly or do any heavy lifting tomorrow. If when you get up you have a headache when standing, go back to bed and force fluids for another 24 hours.  6. Call your physician for a follow-up appointment.  The results of your myelogram will be sent directly to your physician by the following day.  7. If you have any questions or if complications develop after you arrive home, please call [phone].  Discharge instructions have been explained to the patient.  The patient, or the person responsible for the patient, fully understands these instructions.  YOU MAY RESTART YOUR CELEXA TOMORROW 10/25/2018 AT 10:10AM.

## 2018-10-25 ENCOUNTER — Telehealth: Payer: Self-pay | Admitting: Gastroenterology

## 2018-10-25 NOTE — Telephone Encounter (Signed)
Gwen,  Can you follow up for me on the PT for this patient  Please advise

## 2018-10-26 NOTE — Telephone Encounter (Signed)
Agree with Miralax PRN, yes you can make her a follow up appointment with me.

## 2018-10-26 NOTE — Telephone Encounter (Signed)
When I called patient back she states she is now constipated. I suggested she take Miralax twice a day for a couple of days and see if that helps. She also says she has abdominal pain on the left side that travels to her back. She saw her PCP 10/19/18 for diarrhea. She thought she had an appt. With you on 11/22/18 but I don't see one. Would you like me to schedule her for an office visit ?

## 2018-10-27 NOTE — Telephone Encounter (Signed)
Gwen, Do you know how I can get in touch with who she was referred to Please advise

## 2018-10-31 DIAGNOSIS — M5136 Other intervertebral disc degeneration, lumbar region: Secondary | ICD-10-CM | POA: Diagnosis not present

## 2018-11-03 ENCOUNTER — Encounter: Payer: Self-pay | Admitting: Family Medicine

## 2018-11-08 DIAGNOSIS — M25552 Pain in left hip: Secondary | ICD-10-CM | POA: Diagnosis not present

## 2018-11-08 NOTE — Telephone Encounter (Signed)
[facility] had scheduled pt for the [place] office I have informed them pt would like to be seen in [facility], office will call pt today and schedule

## 2018-11-09 DIAGNOSIS — G8929 Other chronic pain: Secondary | ICD-10-CM | POA: Diagnosis not present

## 2018-11-09 DIAGNOSIS — M545 Low back pain: Secondary | ICD-10-CM | POA: Diagnosis not present

## 2018-11-09 DIAGNOSIS — M5442 Lumbago with sciatica, left side: Secondary | ICD-10-CM | POA: Diagnosis not present

## 2018-11-10 DIAGNOSIS — M5442 Lumbago with sciatica, left side: Secondary | ICD-10-CM | POA: Diagnosis not present

## 2018-11-10 DIAGNOSIS — G8929 Other chronic pain: Secondary | ICD-10-CM | POA: Diagnosis not present

## 2018-11-10 DIAGNOSIS — M545 Low back pain: Secondary | ICD-10-CM | POA: Diagnosis not present

## 2018-11-13 DIAGNOSIS — M545 Low back pain: Secondary | ICD-10-CM | POA: Diagnosis not present

## 2018-11-13 DIAGNOSIS — M5442 Lumbago with sciatica, left side: Secondary | ICD-10-CM | POA: Diagnosis not present

## 2018-11-13 DIAGNOSIS — G8929 Other chronic pain: Secondary | ICD-10-CM | POA: Diagnosis not present

## 2018-11-15 DIAGNOSIS — G8929 Other chronic pain: Secondary | ICD-10-CM | POA: Diagnosis not present

## 2018-11-15 DIAGNOSIS — M5442 Lumbago with sciatica, left side: Secondary | ICD-10-CM | POA: Diagnosis not present

## 2018-11-15 DIAGNOSIS — M545 Low back pain: Secondary | ICD-10-CM | POA: Diagnosis not present

## 2018-11-20 DIAGNOSIS — M545 Low back pain: Secondary | ICD-10-CM | POA: Diagnosis not present

## 2018-11-20 DIAGNOSIS — M5442 Lumbago with sciatica, left side: Secondary | ICD-10-CM | POA: Diagnosis not present

## 2018-11-20 DIAGNOSIS — G8929 Other chronic pain: Secondary | ICD-10-CM | POA: Diagnosis not present

## 2018-11-21 NOTE — Progress Notes (Signed)
Covid-19 screening questions:   Do you now or have you had a fever in the last 14 days? NO  Do you have any respiratory symptoms of shortness of breath or cough now or in the last 14 days? NO but pt has asthma  Do you have any family members or close contacts with diagnosed or suspected Covid-19 in the past 14 days?  NO  Have you been tested for Covid-19 and found to be positive? NO

## 2018-11-22 ENCOUNTER — Other Ambulatory Visit: Payer: Self-pay

## 2018-11-22 ENCOUNTER — Ambulatory Visit: Payer: Medicare HMO | Admitting: Gastroenterology

## 2018-11-22 ENCOUNTER — Encounter: Payer: Self-pay | Admitting: Gastroenterology

## 2018-11-22 VITALS — BP 114/54 | HR 62 | Temp 98.2°F | Ht <= 58 in | Wt 126.0 lb

## 2018-11-22 DIAGNOSIS — K5900 Constipation, unspecified: Secondary | ICD-10-CM | POA: Diagnosis not present

## 2018-11-22 DIAGNOSIS — K219 Gastro-esophageal reflux disease without esophagitis: Secondary | ICD-10-CM | POA: Diagnosis not present

## 2018-11-22 DIAGNOSIS — R6881 Early satiety: Secondary | ICD-10-CM

## 2018-11-22 DIAGNOSIS — R109 Unspecified abdominal pain: Secondary | ICD-10-CM

## 2018-11-22 DIAGNOSIS — R1013 Epigastric pain: Secondary | ICD-10-CM | POA: Diagnosis not present

## 2018-11-22 MED ORDER — GABAPENTIN 100 MG PO CAPS: ORAL_CAPSULE | ORAL | 2 refills | Status: DC

## 2018-11-22 MED ORDER — POLYETHYLENE GLYCOL 3350 17 G PO PACK: 17.0000 g | PACK | Freq: Every day | ORAL | 0 refills | Status: DC

## 2018-11-22 MED ORDER — FAMOTIDINE 20 MG PO TABS: 20.0000 mg | ORAL_TABLET | Freq: Two times a day (BID) | ORAL | 3 refills | Status: DC

## 2018-11-22 MED ORDER — SUCRALFATE 1 G PO TABS: 1.0000 g | ORAL_TABLET | Freq: Four times a day (QID) | ORAL | 2 refills | Status: DC | PRN

## 2018-11-22 NOTE — Progress Notes (Signed)
HPI :  78 y/o female here for a follow up visit. She has been seen in the past for dyspepsia and functional bowel disorder. She had had a negative workup for celiac disease including serologic testing and EGD.She had negative stool testing in the past as well as colonoscopy in 2017 at [facility] (although biopsies not taken). She has had 2 CT scans of the abdomen / pelvis in 2019 for these symptoms without any significant pathology.   She is here today with several symptoms that bother her. At the last visit she continued a probiotic, and we reduced her omeprazole in dose to 40mg  once daily due to consider about possible risks / side effects.  She has been more constipated than usual recently. Pass hard / small stools, she states she feels like she cannot evacuate completely. She also feels a lot more gas / abdominal distension and bloating with this. She is not using anything for her bowels. Previously a few months ago she had loose stools, but constipation predominant more recently.   She otherwise continues to have dyspepsia. Feels full easily, has discomfort in her upper abdomen after eating at times. She is s/p cholecystectomy remotely. She previously was on carafate for these symptoms and higher dose omeprazole, but not recently. She feels nauseated frequently but no vomiting. Symptoms have been long standing and stable / chronic. Her pyrosis has been worse lately on the lower dose protonix but she is fearful of side effect of higher dosing. No dysphagia.  She otherwise has constant lower abdominal pain that bothers her more so recently. Associated with this she has severe lumbar back pain which radiates down into her left leg. She had a CT scan of her lumbar spine showing spinal stenosis recently. She has received injection therapy and doing PT. The pain is there mostly all the time, never goes away, is unchanged with a bowel movement. No blood in the stools.   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 Ileum normal. No polyps or evidence of colitis. Biopsies not taken. 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 lumpectomy, radtiation x 30   Breast cancer in female [facility])    Change in mole 06/08/2016   Colitis    Decreased visual acuity 01/16/2017   Dyspnea 11/04/2016   Dysuria 11/04/2016   Gluten intolerance    History of chicken pox    History of Helicobacter pylori infection 08/01/2015   Hyperlipidemia    Hypertension    Hypothyroid    IBS (irritable bowel syndrome)    Left leg pain 03/01/2017   Low back pain 08/10/2015   Neck pain 03/01/2017   Osteoporosis    Personal history of radiation therapy    RLQ discomfort 03/01/2017   Stomach cramps    Urinary tract infection 11/04/2016     Past Surgical History:  Procedure Laterality Date   ABDOMINAL HYSTERECTOMY     partial   APPENDECTOMY     BREAST LUMPECTOMY Left 2002   CHOLECYSTECTOMY  Age 61 or 80   COLONOSCOPY  2017   EYE SURGERY Left  cataract   FRACTURE SURGERY     TONSILLECTOMY     Family History  Problem Relation Age of Onset   Colitis Mother    Irritable bowel syndrome Mother    Hypertension Father    Hyperlipidemia Brother    Heart attack Brother    Stomach cancer Paternal Grandmother 90   Hyperlipidemia Brother    Heart attack Brother    Hyperlipidemia Brother    Heart attack Brother    Hyperlipidemia Sister    Leukemia Daughter    Arthritis Daughter    Heart disease Daughter        ASD vs VSD   Colon cancer Neg Hx    Social History   Tobacco Use   Smoking status:  Never Smoker   Smokeless tobacco: Never Used  Substance Use Topics   Alcohol use: No    Alcohol/week: 0.0 standard drinks   Drug use: No   Current Outpatient Medications  Medication Sig Dispense Refill   aspirin 81 MG tablet Take 81 mg by mouth daily.     citalopram (CELEXA) 20 MG tablet TAKE 1  TABLET BY MOUTH ONCE DAILY 90 tablet 1   fluticasone (FLOVENT HFA) 110 MCG/ACT inhaler Inhale 1 puff into the lungs 2 (two) times daily. 1 Inhaler 12   hyoscyamine (LEVSIN SL) 0.125 MG SL tablet Place 1 tablet (0.125 mg total) under the tongue every 4 (four) hours as needed for cramping. 30 tablet 1   levothyroxine (SYNTHROID, LEVOTHROID) 50 MCG tablet Take 1 tablet (50 mcg total) by mouth daily. 90 tablet 1   lisinopril (PRINIVIL,ZESTRIL) 20 MG tablet Take 1 tablet (20 mg total) by mouth daily. 90 tablet 3   metoprolol succinate (TOPROL-XL) 25 MG 24 hr tablet Take 1 tablet (25 mg total) by mouth daily. 30 tablet 11   montelukast (SINGULAIR) 10 MG tablet Take by mouth daily as needed.      omega-3 acid ethyl esters (LOVAZA) 1 g capsule Take 2 g by mouth daily.     primidone (MYSOLINE) 50 MG tablet Take 1 tablet (50 mg total) by mouth at bedtime. 90 tablet 1   Probiotic Product (PROBIOTIC DAILY PO) Take by mouth daily.     No current facility-administered medications for this visit.    No Known Allergies   Review of Systems: All systems reviewed and negative except where noted in HPI.    Ct Lumbar Spine W Contrast  Result Date: 10/24/2018 CLINICAL DATA:  Chronic severe left lower back pain and left thigh pain. EXAM: LUMBAR MYELOGRAM CT LUMBAR MYELOGRAM FLUOROSCOPY TIME:  Radiation Exposure Index (as provided by the fluoroscopic device): 21.4 mGy Fluoroscopy Time:  15 seconds Number of Acquired Images:  16 PROCEDURE: After thorough discussion of risks and benefits of the procedure including bleeding, infection, injury to nerves, blood vessels, adjacent structures as well as headache  and CSF leak, written and oral informed consent was obtained. Consent was obtained by Dr. Fabiola Backer. Time out form was completed. Patient was positioned prone on the fluoroscopy table. Local anesthesia was provided with 1% lidocaine without epinephrine after prepped and draped in the usual sterile fashion. Puncture was performed at L5-S1 using a 3 1/2 inch 22-gauge spinal needle via right paramedian approach. Using a single pass through the dura, the needle was placed within the thecal sac, with return of clear CSF. 15 mL of Isovue M-200 was injected into the thecal sac, with normal opacification of the nerve roots and cauda equina consistent with free flow  within the subarachnoid space. I personally performed the lumbar puncture and administered the intrathecal contrast. I also personally supervised acquisition of the myelogram images. TECHNIQUE: Contiguous axial images were obtained through the lumbar spine after the intrathecal infusion of contrast. Coronal and sagittal reconstructions were obtained of the axial image sets. COMPARISON:  MRI lumbar spine dated July 08, 2018. FINDINGS: LUMBAR MYELOGRAM FINDINGS: Unchanged mild lumbar levoscoliosis. Unchanged trace retrolisthesis at L2-L3 and trace anterolisthesis at L3-L4 and L4-L5. Anterolisthesis at L4-L5 is unchanged with standing or extension, but increases to 4 mm with flexion. Small ventral extradural defects at L1-L2, L2-L3, L3-L4, and L4-L5. Mild to moderate spinal canal stenosis at L3-L4 and L4-L5 slightly worsens with flexion. Prominent right lateral recess effacement at L3-L4. CT LUMBAR MYELOGRAM FINDINGS: Segmentation: 5 lumbar type vertebral bodies. Alignment: Trace retrolisthesis at L1-L2 and L2-L3. Trace anterolisthesis at L3-L4 and L4-L5. Vertebrae: No acute fracture. No acute fracture or focal pathologic process. Conus medullaris and cauda equina: Conus extends to the L1 level. Conus and cauda equina appear normal. Paraspinal and other soft  tissues: Mild aortic atherosclerosis. Sigmoid diverticulosis. Disc levels: T12-L1: Negative disc. Mild right facet arthropathy and ligamentum flavum hypertrophy. No stenosis. L1-L2: Unchanged mild disc bulging and mild bilateral facet arthropathy. No stenosis. L2-L3: Unchanged minimal disc bulging and mild bilateral facet arthropathy. No stenosis. L3-L4: Unchanged mild disc bulging with superimposed small right subarticular disc protrusion. Unchanged moderate right and mild left facet arthropathy with ligamentum flavum hypertrophy. Unchanged moderate spinal canal stenosis, moderate to severe right lateral recess stenosis, and mild right neuroforaminal stenosis. No left neuroforaminal stenosis. L4-L5: Unchanged mild disc bulging and moderate bilateral facet arthropathy with ligamentum flavum hypertrophy. Unchanged mild to moderate spinal canal stenosis, moderate left greater than right lateral recess stenosis, and mild-to-moderate right and mild left neuroforaminal stenosis. L5-S1: Unchanged mild disc bulging and moderate left greater than right facet arthropathy with ligamentum flavum hypertrophy. Unchanged mild left lateral recess and neuroforaminal stenosis. No spinal canal right neuroforaminal stenosis. IMPRESSION: 1. Trace anterolisthesis at L4-L5 when supine or prone increases to 4 mm with flexion. 2. Moderate L3-L4 and mild to moderate L4-L5 spinal canal stenosis slightly worsen with flexion. 3. Unchanged left lateral recess stenosis at L4-L5 and L5-S1 with potential for L5 and S1 nerve root impingement, respectively. Electronically Signed   By: Titus Dubin M.D.   On: 10/24/2018 13:20   Dg Myelography Lumbar Inj Lumbosacral  Result Date: 10/24/2018 CLINICAL DATA:  Chronic severe left lower back pain and left thigh pain. EXAM: LUMBAR MYELOGRAM CT LUMBAR MYELOGRAM FLUOROSCOPY TIME:  Radiation Exposure Index (as provided by the fluoroscopic device): 21.4 mGy Fluoroscopy Time:  15 seconds Number of Acquired  Images:  16 PROCEDURE: After thorough discussion of risks and benefits of the procedure including bleeding, infection, injury to nerves, blood vessels, adjacent structures as well as headache and CSF leak, written and oral informed consent was obtained. Consent was obtained by Dr. Fabiola Backer. Time out form was completed. Patient was positioned prone on the fluoroscopy table. Local anesthesia was provided with 1% lidocaine without epinephrine after prepped and draped in the usual sterile fashion. Puncture was performed at L5-S1 using a 3 1/2 inch 22-gauge spinal needle via right paramedian approach. Using a single pass through the dura, the needle was placed within the thecal sac, with return of clear CSF. 15 mL of Isovue M-200 was injected into the thecal sac, with normal opacification of the nerve roots and cauda equina consistent with free flow within the subarachnoid  space. I personally performed the lumbar puncture and administered the intrathecal contrast. I also personally supervised acquisition of the myelogram images. TECHNIQUE: Contiguous axial images were obtained through the lumbar spine after the intrathecal infusion of contrast. Coronal and sagittal reconstructions were obtained of the axial image sets. COMPARISON:  MRI lumbar spine dated July 08, 2018. FINDINGS: LUMBAR MYELOGRAM FINDINGS: Unchanged mild lumbar levoscoliosis. Unchanged trace retrolisthesis at L2-L3 and trace anterolisthesis at L3-L4 and L4-L5. Anterolisthesis at L4-L5 is unchanged with standing or extension, but increases to 4 mm with flexion. Small ventral extradural defects at L1-L2, L2-L3, L3-L4, and L4-L5. Mild to moderate spinal canal stenosis at L3-L4 and L4-L5 slightly worsens with flexion. Prominent right lateral recess effacement at L3-L4. CT LUMBAR MYELOGRAM FINDINGS: Segmentation: 5 lumbar type vertebral bodies. Alignment: Trace retrolisthesis at L1-L2 and L2-L3. Trace anterolisthesis at L3-L4 and L4-L5. Vertebrae: No  acute fracture. No acute fracture or focal pathologic process. Conus medullaris and cauda equina: Conus extends to the L1 level. Conus and cauda equina appear normal. Paraspinal and other soft tissues: Mild aortic atherosclerosis. Sigmoid diverticulosis. Disc levels: T12-L1: Negative disc. Mild right facet arthropathy and ligamentum flavum hypertrophy. No stenosis. L1-L2: Unchanged mild disc bulging and mild bilateral facet arthropathy. No stenosis. L2-L3: Unchanged minimal disc bulging and mild bilateral facet arthropathy. No stenosis. L3-L4: Unchanged mild disc bulging with superimposed small right subarticular disc protrusion. Unchanged moderate right and mild left facet arthropathy with ligamentum flavum hypertrophy. Unchanged moderate spinal canal stenosis, moderate to severe right lateral recess stenosis, and mild right neuroforaminal stenosis. No left neuroforaminal stenosis. L4-L5: Unchanged mild disc bulging and moderate bilateral facet arthropathy with ligamentum flavum hypertrophy. Unchanged mild to moderate spinal canal stenosis, moderate left greater than right lateral recess stenosis, and mild-to-moderate right and mild left neuroforaminal stenosis. L5-S1: Unchanged mild disc bulging and moderate left greater than right facet arthropathy with ligamentum flavum hypertrophy. Unchanged mild left lateral recess and neuroforaminal stenosis. No spinal canal right neuroforaminal stenosis. IMPRESSION: 1. Trace anterolisthesis at L4-L5 when supine or prone increases to 4 mm with flexion. 2. Moderate L3-L4 and mild to moderate L4-L5 spinal canal stenosis slightly worsen with flexion. 3. Unchanged left lateral recess stenosis at L4-L5 and L5-S1 with potential for L5 and S1 nerve root impingement, respectively. Electronically Signed   By: Titus Dubin M.D.   On: 10/24/2018 13:20   Lab Results  Component Value Date   WBC 7.1 06/27/2018   HGB 11.5 (L) 06/27/2018   HCT 35.1 (L) 06/27/2018   MCV 89.4  06/27/2018   PLT 257.0 06/27/2018    Lab Results  Component Value Date   CREATININE 1.07 06/27/2018   BUN 27 (H) 06/27/2018   NA 138 06/27/2018   K 4.3 06/27/2018   CL 103 06/27/2018   CO2 28 06/27/2018     Lab Results  Component Value Date   ALT 20 06/27/2018   AST 21 06/27/2018   ALKPHOS 48 06/27/2018   BILITOT 0.6 06/27/2018      Physical Exam: BP (!) 114/54    Pulse 62    Temp 98.2 F (36.8 C)    Ht 4\' 9"  (1.448 m)    Wt 126 lb (57.2 kg)    BMI 27.27 kg/m  Constitutional: Pleasant,well-developed, female in no acute distress. HEENT: Normocephalic and atraumatic. Conjunctivae are normal. No scleral icterus. Neck supple.  Cardiovascular: Normal rate, regular rhythm.  Pulmonary/chest: Effort normal and breath sounds normal. No wheezing, rales or rhonchi. Abdominal: Soft, nondistended, tenderness to palpation in  the upper abdomen diffusely, mild, with tenderness in the lower abdomen diffusely, no peritoneal signs. There are no masses palpable. Marland Kitchen DRE - [employer] standby, normal exam, no mass lesions Extremities: no edema Lymphadenopathy: No cervical adenopathy noted. Neurological: Alert and oriented to person place and time. Skin: Skin is warm and dry. No rashes noted. Psychiatric: Normal mood and affect. Behavior is normal.   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 Given her worsening early satiety, will refer for gastric emptying scan. Prior EGD for these symptoms was unremarkable in 2017. In the interim, she is quite hesitant to increase her omeprazole but this provided her benefit in the past. Will add pepcid 20mg  BID and carafate to use PRN which has helped. Will await GES and her response to this regimen. She agreed  Abdominal pain - lower abdomen pain, constant, as  described above, in the setting of low back pain and CT scan findings. I suspect this may be musculoskeletal. Given she has ongoing back pain with this, will try gabapentin 100mg  qHS, increase to 200mg  qHS after one week and titrate thereafter. I reviewed prior CT scans and colonoscopy with her, I don't think we are missing anything too concerning at this point.    Constipation - worsening recently. DRE done in clinic, nothing too concerning. Recommend she use Miralax daily, I think this will help her bloating. She can follow up PRN if symptoms persist.    Hills Cellar, MD [facility]

## 2018-11-22 NOTE — Patient Instructions (Addendum)
If you are age 77 or older, your body mass index should be between 23-30. Your Body mass index is 27.27 kg/m. If this is out of the aforementioned range listed, please consider follow up with your Primary Care Provider.  If you are age 52 or younger, your body mass index should be between 19-25. Your Body mass index is 27.27 kg/m. If this is out of the aformentioned range listed, please consider follow up with your Primary Care Provider.   To help prevent the possible spread of infection to our patients, communities, and staff; we will be implementing the following measures:  As of now we are not allowing any visitors/family members to accompany you to any upcoming appointments with [facility]. If you have any concerns about this please contact our office to discuss prior to the appointment.   You have been scheduled for a gastric emptying scan at [facility] on Thursday, 12-07-18 at 7:30am. Please arrive at least 15 minutes prior to your appointment for registration. Please make certain not to have anything to eat or drink after midnight the night before your test. Hold all stomach medications (ex: Zofran, phenergan, Reglan) 48 hours prior to your test. If you need to reschedule your appointment, please contact radiology scheduling at [phone]. _____________________________________________________________________ A gastric-emptying study measures how long it takes for food to move through your stomach. There are several ways to measure stomach emptying. In the most common test, you eat food that contains a small amount of radioactive material. A scanner that detects the movement of the radioactive material is placed over your abdomen to monitor the rate at which food leaves your stomach. This test normally takes about 4 hours to complete. _____________________________________________________________________  We have sent the following medications to your pharmacy for you to  pick up at your convenience: Pepcid 20mg : Take twice a day Carafate1g: Take evey 6 hours as needed Gabapentin 100mg : Take 1 tablet (100 mg) every evening at bed time for one week then increase to 2 tablets (200 mg) at bedtime thereafter  Continue your omeprazole.  Please purchase the following medications over the counter and take as directed: Miralax - Take as directed  Thank you for entrusting me with your care and for choosing  Health[facility], Dr. Hobson Cellar

## 2018-11-23 DIAGNOSIS — G8929 Other chronic pain: Secondary | ICD-10-CM | POA: Diagnosis not present

## 2018-11-23 DIAGNOSIS — M5442 Lumbago with sciatica, left side: Secondary | ICD-10-CM | POA: Diagnosis not present

## 2018-11-23 DIAGNOSIS — M545 Low back pain: Secondary | ICD-10-CM | POA: Diagnosis not present

## 2018-11-28 DIAGNOSIS — M25552 Pain in left hip: Secondary | ICD-10-CM | POA: Diagnosis not present

## 2018-12-01 DIAGNOSIS — M545 Low back pain: Secondary | ICD-10-CM | POA: Diagnosis not present

## 2018-12-01 DIAGNOSIS — G8929 Other chronic pain: Secondary | ICD-10-CM | POA: Diagnosis not present

## 2018-12-01 DIAGNOSIS — M5442 Lumbago with sciatica, left side: Secondary | ICD-10-CM | POA: Diagnosis not present

## 2018-12-04 DIAGNOSIS — M5442 Lumbago with sciatica, left side: Secondary | ICD-10-CM | POA: Diagnosis not present

## 2018-12-04 DIAGNOSIS — G8929 Other chronic pain: Secondary | ICD-10-CM | POA: Diagnosis not present

## 2018-12-04 DIAGNOSIS — M545 Low back pain: Secondary | ICD-10-CM | POA: Diagnosis not present

## 2018-12-07 ENCOUNTER — Other Ambulatory Visit: Payer: Self-pay

## 2018-12-07 ENCOUNTER — Encounter (HOSPITAL_COMMUNITY)
Admission: RE | Admit: 2018-12-07 | Discharge: 2018-12-07 | Disposition: A | Discharge status: Home / Self Care | Payer: Medicare HMO | Source: Ambulatory Visit | Attending: Gastroenterology | Admitting: Gastroenterology

## 2018-12-07 DIAGNOSIS — R6881 Early satiety: Secondary | ICD-10-CM | POA: Diagnosis not present

## 2018-12-07 DIAGNOSIS — R109 Unspecified abdominal pain: Secondary | ICD-10-CM | POA: Diagnosis not present

## 2018-12-07 MED ORDER — TECHNETIUM TC 99M SULFUR COLLOID
1.9000 | Freq: Once | INTRAVENOUS | Status: AC | PRN
  Administered 2018-12-07: 1.9 via INTRAVENOUS

## 2018-12-08 DIAGNOSIS — M5442 Lumbago with sciatica, left side: Secondary | ICD-10-CM | POA: Diagnosis not present

## 2018-12-08 DIAGNOSIS — G8929 Other chronic pain: Secondary | ICD-10-CM | POA: Diagnosis not present

## 2018-12-08 DIAGNOSIS — M545 Low back pain: Secondary | ICD-10-CM | POA: Diagnosis not present

## 2018-12-11 ENCOUNTER — Other Ambulatory Visit: Payer: Self-pay

## 2018-12-11 DIAGNOSIS — M5442 Lumbago with sciatica, left side: Secondary | ICD-10-CM | POA: Diagnosis not present

## 2018-12-11 DIAGNOSIS — G8929 Other chronic pain: Secondary | ICD-10-CM | POA: Diagnosis not present

## 2018-12-11 DIAGNOSIS — M545 Low back pain: Secondary | ICD-10-CM | POA: Diagnosis not present

## 2018-12-12 ENCOUNTER — Ambulatory Visit (INDEPENDENT_AMBULATORY_CARE_PROVIDER_SITE_OTHER): Payer: Medicare HMO | Admitting: Medical

## 2018-12-12 ENCOUNTER — Encounter: Payer: Self-pay | Admitting: Medical

## 2018-12-12 VITALS — BP 116/50 | HR 93 | Ht <= 58 in | Wt 123.0 lb

## 2018-12-12 DIAGNOSIS — R5383 Other fatigue: Secondary | ICD-10-CM

## 2018-12-12 DIAGNOSIS — R300 Dysuria: Secondary | ICD-10-CM

## 2018-12-12 LAB — POC URINALSYSI DIPSTICK (AUTOMATED)
Bilirubin, UA: NEGATIVE
Blood, UA: NEGATIVE
Glucose, UA: NEGATIVE
Ketones, UA: NEGATIVE
Leukocytes, UA: NEGATIVE
Nitrite, UA: NEGATIVE
Protein, UA: NEGATIVE
Spec Grav, UA: 1.015 (ref 1.010–1.025)
Urobilinogen, UA: 0.2 E.U./dL
pH, UA: 6 (ref 5.0–8.0)

## 2018-12-12 MED ORDER — CIPROFLOXACIN HCL 250 MG PO TABS: 250.0000 mg | ORAL_TABLET | Freq: Two times a day (BID) | ORAL | 0 refills | Status: DC

## 2018-12-12 NOTE — Patient Instructions (Addendum)
You appear to have a urinary tract infection. I am prescribing cipro antibiotic for the probable infection. Hydrate well. I am sending out a urine culture. During the interim if your signs and symptoms worsen rather than improving please notify us. We will notify your when the culture results are back.  Also for fatigue and probable uti will include cbc and cmp with labs today.   Follow up in 7 days or as needed.

## 2018-12-12 NOTE — Progress Notes (Signed)
Subjective:    Patient ID: Julie Wilkerson, female    DOB: [birth year], 78 y.o.   MRN: [id number]  HPI   Virtual Visit via Video Note  I connected with [name] on 12/12/18 at  1:00 PM EDT by a video enabled telemedicine application and verified that I am speaking with the correct person using two identifiers.  Location: Patient: home Provider: office.   I discussed the limitations of evaluation and management by telemedicine and the availability of in person appointments. The patient expressed understanding and agreed to proceed.  History of Present Illness: Pt states had several days of pain over he bladder area. Some pain on urination.Urge to urinate constant with odor to urine.  Pt has no fever. No chills or sweats.  Low level pain in lower back. But not described specifically in kidney area.   Feels weak and fatigue. Urine has odor.   Observations/Objective: General-no acute distress, pleasant, oriented. Lungs- on inspection lungs appear unlabored. Neck- no tracheal deviation or jvd on inspection. Neuro- gross motor function appears intact. abd- pt reports suprapbic pressure/pain  Assessment and Plan: You appear to have a urinary tract infection. I am prescribing cipro antibiotic for the probable infection. Hydrate well. I am sending out a urine culture. During the interim if your signs and symptoms worsen rather than improving please notify us. We will notify your when the culture results are back.  Also for fatigue and probable uti will include cbc and cmp with labs today.   Follow up in 7 days or as needed.  Mackie Pai, PA-C  Follow Up Instructions:    I discussed the assessment and treatment plan with the patient. The patient was provided an opportunity to ask questions and all were answered. The patient agreed with the plan and demonstrated an understanding of the instructions.   The patient was advised to call back or seek an in-person  evaluation if the symptoms worsen or if the condition fails to improve as anticipated.  I provided 17  minutes of non-face-to-face time during this encounter.   Mackie Pai, PA-C    Review of Systems  Constitutional: Positive for fatigue. Negative for chills and fever.  Respiratory: Negative for chest tightness, shortness of breath and wheezing.   Cardiovascular: Negative for chest pain and palpitations.  Gastrointestinal: Positive for abdominal pain. Negative for abdominal distention, constipation, nausea and vomiting.       Supra pubic tenderness reported.  Genitourinary: Positive for dysuria, frequency and urgency. Negative for vaginal pain.       Odor to urine.  Musculoskeletal: Positive for back pain.  Skin: Negative for rash.  Hematological: Negative for adenopathy. Does not bruise/bleed easily.  Psychiatric/Behavioral: Negative for behavioral problems and confusion.    Past Medical History:  Diagnosis Date  . Asthma   . Breast cancer ([place])    2002, left, encapsulated, microcalcifications. lumpectomy, radtiation x 30  . Breast cancer in female [name])   . Change in mole 06/08/2016  . Colitis   . Decreased visual acuity 01/16/2017  . Dyspnea 11/04/2016  . Dysuria 11/04/2016  . Gluten intolerance   . History of chicken pox   . History of Helicobacter pylori infection 08/01/2015  . Hyperlipidemia   . Hypertension   . Hypothyroid   . IBS (irritable bowel syndrome)   . Left leg pain 03/01/2017  . Low back pain 08/10/2015  . Neck pain 03/01/2017  . Osteoporosis   . Personal history of radiation therapy   .  RLQ discomfort 03/01/2017  . Stomach cramps   . Urinary tract infection 11/04/2016     Social History   Socioeconomic History  . Marital status: Single    Spouse name: Not on file  . Number of children: Not on file  . Years of education: Not on file  . Highest education level: Not on file  Occupational History  . Occupation: retired    Comment: Pharmacist, hospital,  kindergarten  Social Needs  . Financial resource strain: Not on file  . Food insecurity    Worry: Not on file    Inability: Not on file  . Transportation needs    Medical: Not on file    Non-medical: Not on file  Tobacco Use  . Smoking status: Never Smoker  . Smokeless tobacco: Never Used  Substance and Sexual Activity  . Alcohol use: No    Alcohol/week: 0.0 standard drinks  . Drug use: No  . Sexual activity: Not Currently    Birth control/protection: None    Comment: lives alone, avoids daiiry and gluten. 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 Physical activity    Days per week: Not on file    Minutes per session: Not on file  . Stress: Not on file  Relationships  . Social Herbalist on phone: Not on file    Gets together: Not on file    Attends religious service: Not on file    Active member of club or organization: Not on file    Attends meetings of clubs or organizations: Not on file    Relationship status: Not on file  . Intimate partner violence    Fear of current or ex partner: Not on file    Emotionally abused: Not on file    Physically abused: Not on file    Forced sexual activity: Not on file  Other Topics Concern  . Not on file  Social History Narrative  . Not on file    Past Surgical History:  Procedure Laterality Date  . ABDOMINAL HYSTERECTOMY     partial  . APPENDECTOMY    . BREAST LUMPECTOMY Left 2002  . CHOLECYSTECTOMY  Age 54 or 14  . COLONOSCOPY  2017  . EYE SURGERY Left    cataract  . FRACTURE SURGERY    . TONSILLECTOMY      Family History  Problem Relation Age of Onset  . Colitis Mother   . Irritable bowel syndrome Mother   . Hypertension Father   . Hyperlipidemia Brother   . Heart attack Brother   . Stomach cancer Paternal Grandmother 75  . Hyperlipidemia Brother   . Heart attack Brother   . Hyperlipidemia Brother   . Heart attack Brother   . Hyperlipidemia Sister   . Leukemia Daughter   . Arthritis Daughter   .  Heart disease Daughter        ASD vs VSD  . Colon cancer Neg Hx     No Known Allergies  Current Outpatient Medications on File Prior to Visit  Medication Sig Dispense Refill  . aspirin 81 MG tablet Take 81 mg by mouth daily.    . citalopram (CELEXA) 20 MG tablet TAKE 1  TABLET BY MOUTH ONCE DAILY 90 tablet 1  . famotidine (PEPCID) 20 MG tablet Take 1 tablet (20 mg total) by mouth 2 (two) times daily. 60 tablet 3  . fluticasone (FLOVENT HFA) 110 MCG/ACT inhaler Inhale 1 puff into the lungs 2 (two) times daily. 1  Inhaler 12  . gabapentin (NEURONTIN) 100 MG capsule Take 1 tablet (100mg ) every night at bedtime for one week, then increase to 2 tablets (200mg ) every night at bedtime thereafter 60 capsule 2  . hyoscyamine (LEVSIN SL) 0.125 MG SL tablet Place 1 tablet (0.125 mg total) under the tongue every 4 (four) hours as needed for cramping. 30 tablet 1  . levothyroxine (SYNTHROID, LEVOTHROID) 50 MCG tablet Take 1 tablet (50 mcg total) by mouth daily. 90 tablet 1  . lisinopril (PRINIVIL,ZESTRIL) 20 MG tablet Take 1 tablet (20 mg total) by mouth daily. 90 tablet 3  . metoprolol succinate (TOPROL-XL) 25 MG 24 hr tablet Take 1 tablet (25 mg total) by mouth daily. 30 tablet 11  . montelukast (SINGULAIR) 10 MG tablet Take by mouth daily as needed.     Marland Kitchen omega-3 acid ethyl esters (LOVAZA) 1 g capsule Take 2 g by mouth daily.    . polyethylene glycol (MIRALAX) 17 g packet Take 17 g by mouth daily. 14 each 0  . primidone (MYSOLINE) 50 MG tablet Take 1 tablet (50 mg total) by mouth at bedtime. 90 tablet 1  . Probiotic Product (PROBIOTIC DAILY PO) Take by mouth daily.    . sucralfate (CARAFATE) 1 g tablet Take 1 tablet (1 g total) by mouth every 6 (six) hours as needed. 60 tablet 2   No current facility-administered medications on file prior to visit.     BP (!) 116/50   Pulse 93   Ht 4\' 9"  (1.448 m)   Wt 123 lb (55.8 kg)   BMI 26.62 kg/m       Objective:   Physical Exam         Assessment & Plan:

## 2018-12-13 LAB — COMPREHENSIVE METABOLIC PANEL
ALT: 16 U/L (ref 0–35)
AST: 24 U/L (ref 0–37)
Albumin: 3.9 g/dL (ref 3.5–5.2)
Alkaline Phosphatase: 52 U/L (ref 39–117)
BUN: 27 mg/dL — ABNORMAL HIGH (ref 6–23)
CO2: 23 mEq/L (ref 19–32)
Calcium: 8.9 mg/dL (ref 8.4–10.5)
Chloride: 103 mEq/L (ref 96–112)
Creatinine, Ser: 1.1 mg/dL (ref 0.40–1.20)
GFR: 48.16 mL/min — ABNORMAL LOW (ref >60.00)
Glucose, Bld: 129 mg/dL — ABNORMAL HIGH (ref 70–99)
Potassium: 4.4 mEq/L (ref 3.5–5.1)
Sodium: 135 mEq/L (ref 135–145)
Total Bilirubin: 0.4 mg/dL (ref 0.2–1.2)
Total Protein: 6.6 g/dL (ref 6.0–8.3)

## 2018-12-13 LAB — CBC WITH DIFFERENTIAL/PLATELET
Basophils Absolute: 0.1 10*3/uL (ref 0.0–0.1)
Basophils Relative: 1.4 % (ref 0.0–3.0)
Eosinophils Absolute: 0 10*3/uL (ref 0.0–0.7)
Eosinophils Relative: 0 % (ref 0.0–5.0)
HCT: 31.8 % — ABNORMAL LOW (ref 36.0–46.0)
Hemoglobin: 10.5 g/dL — ABNORMAL LOW (ref 12.0–15.0)
Lymphocytes Relative: 34.8 % (ref 12.0–46.0)
Lymphs Abs: 1.8 10*3/uL (ref 0.7–4.0)
MCHC: 33 g/dL (ref 30.0–36.0)
MCV: 88.7 fl (ref 78.0–100.0)
Monocytes Absolute: 0.6 10*3/uL (ref 0.1–1.0)
Monocytes Relative: 12.4 % — ABNORMAL HIGH (ref 3.0–12.0)
Neutro Abs: 2.6 10*3/uL (ref 1.4–7.7)
Neutrophils Relative %: 51.4 % (ref 43.0–77.0)
Platelets: 239 10*3/uL (ref 150.0–400.0)
RBC: 3.59 Mil/uL — ABNORMAL LOW (ref 3.87–5.11)
RDW: 15.2 % (ref 11.5–15.5)
WBC: 5 10*3/uL (ref 4.0–10.5)

## 2018-12-13 LAB — URINE CULTURE
MICRO NUMBER:: 688514
SPECIMEN QUALITY:: ADEQUATE

## 2018-12-14 ENCOUNTER — Ambulatory Visit: Payer: Medicare HMO | Admitting: Family Medicine

## 2018-12-14 DIAGNOSIS — M545 Low back pain: Secondary | ICD-10-CM | POA: Diagnosis not present

## 2018-12-14 DIAGNOSIS — M5442 Lumbago with sciatica, left side: Secondary | ICD-10-CM | POA: Diagnosis not present

## 2018-12-14 DIAGNOSIS — G8929 Other chronic pain: Secondary | ICD-10-CM | POA: Diagnosis not present

## 2018-12-17 ENCOUNTER — Encounter: Payer: Self-pay | Admitting: Medical

## 2018-12-19 DIAGNOSIS — G8929 Other chronic pain: Secondary | ICD-10-CM | POA: Diagnosis not present

## 2018-12-19 DIAGNOSIS — M5442 Lumbago with sciatica, left side: Secondary | ICD-10-CM | POA: Diagnosis not present

## 2018-12-19 DIAGNOSIS — M545 Low back pain: Secondary | ICD-10-CM | POA: Diagnosis not present

## 2018-12-21 DIAGNOSIS — M25552 Pain in left hip: Secondary | ICD-10-CM | POA: Diagnosis not present

## 2019-01-04 ENCOUNTER — Other Ambulatory Visit: Payer: Self-pay | Admitting: Orthopedic Surgery

## 2019-01-04 DIAGNOSIS — M25552 Pain in left hip: Secondary | ICD-10-CM

## 2019-01-09 ENCOUNTER — Inpatient Hospital Stay: Admission: RE | Admit: 2019-01-09 | Payer: Medicare HMO | Source: Ambulatory Visit

## 2019-01-10 ENCOUNTER — Other Ambulatory Visit: Payer: Self-pay

## 2019-01-10 ENCOUNTER — Ambulatory Visit
Admission: RE | Admit: 2019-01-10 | Discharge: 2019-01-10 | Disposition: A | Discharge status: Home / Self Care | Payer: Medicare HMO | Source: Ambulatory Visit | Attending: Orthopedic Surgery | Admitting: Orthopedic Surgery

## 2019-01-10 DIAGNOSIS — M1612 Unilateral primary osteoarthritis, left hip: Secondary | ICD-10-CM | POA: Diagnosis not present

## 2019-01-10 DIAGNOSIS — M25552 Pain in left hip: Secondary | ICD-10-CM

## 2019-01-10 DIAGNOSIS — M545 Low back pain: Secondary | ICD-10-CM | POA: Diagnosis not present

## 2019-01-10 DIAGNOSIS — G8929 Other chronic pain: Secondary | ICD-10-CM | POA: Diagnosis not present

## 2019-01-10 DIAGNOSIS — M5442 Lumbago with sciatica, left side: Secondary | ICD-10-CM | POA: Diagnosis not present

## 2019-01-11 DIAGNOSIS — M5442 Lumbago with sciatica, left side: Secondary | ICD-10-CM | POA: Diagnosis not present

## 2019-01-11 DIAGNOSIS — G8929 Other chronic pain: Secondary | ICD-10-CM | POA: Diagnosis not present

## 2019-01-11 DIAGNOSIS — M545 Low back pain: Secondary | ICD-10-CM | POA: Diagnosis not present

## 2019-01-15 ENCOUNTER — Other Ambulatory Visit: Payer: Self-pay

## 2019-01-15 NOTE — Progress Notes (Signed)
Subjective:   Julie Wilkerson was seen in consultation in the movement disorder clinic at the request of [name], MD.  The evaluation is for tremor.  Tremor started approximately 5 years ago and involves the R more than L arm, but sometimes it involves the jaw as well.  Tremor is most noticeable when she uses the arms or holds the arms the arms antigravity.   There is no family hx of tremor.    Tremor characteristics: Affected by caffeine:  unknown (doesn't drink any caffeine) Affected by alcohol:  Unknown (doesn't drink any) Affected by stress:  Yes.   Affected by fatigue:  No. Spills soup if on spoon:  May or may not Spills glass of liquid if full:  Has to hold with both hands Affects ADL's (tying shoes, brushing teeth, etc):  No.  Current/Previously tried tremor medications: metoprolol   Current medications that may exacerbate tremor:  symbicort (for very mild asthma - uses it about 3 times a month) - notes much increased tremor with this.  Also c/o L sided headache.  Has had this "since I was a little girl."  Was diagnosed as migraine as child.  Found beef as trigger and started to get better after d/c beef.  About a month ago, it got worse and felt like ice pick in eye.  Going today for eye appt.  Has phophotophobia and phonophobia.  Has nausea.  No emesis.  Takes tylenol or occasionally advil.    Neuroimaging: performed but years ago and she doesn't know results  Outside reports reviewed: historical medical records and office notes.  07/14/18 update: Patient is seen today in follow-up.  I have not seen her since 2017.  I saw her then for tremor and headache.  I have reviewed records since our last visit.  She had an appointment in 06/2017 but n/s and then again in May, 2019 to discuss headache, but was not seen on that day.  She does still have some left-sided migraine, but that has not changed with the course of time.  She returns today to discuss worsening tremor.  She did  have a CT of the brain since our last visit.    CT of the brain was performed on March 29, 2017 for headache.  This was nonacute.  There is atrophy and small vessel disease.  I have reviewed this.  Today, she states that she is here to discuss tremor.  she has had worsening tremor.  She was started on primidone when I saw her in 2017, but she called not long after starting the medication and stated that she felt nauseated.  However, she did not start with half tablets as was prescribed.  She was told to retry the medication with half tablets.  She never called back, nor has she followed up since 2017.  She doesn't remember this, nor does she remember ever being seen at this office.  She insists that she has never been here, so cannot state how long she took primidone.  She reports that R hand shakes more than the L hand.  She is having jaw tremor.  She is spilling liquids when trying to drink.  She has trouble with eating.  She also c/o memory loss.  She goes into a room and forgets what she went into the room for.   Living situation:  Pt lives alone.  The patient does do the finances in the home.  The patient does drive and gets to  familiar places but doesn't like to drive on the highway and gets someone to drive in unfamiliar territories (like to my office).  She had quit driving because of a "nervous breakdown" but restarted about 6 years ago.   There have no been any motor vehicle accidents in the recent years.  The patient does cook.  There is no difficulty remembering common recipes.  The stovetop has not been left on accidentally.  ADL's:  The patient is able to perform her own ADL's. The patient self medicates.The patients bowel is under control but occasionally has "bladder leaks."  01/16/19 update: Patient seen today in follow-up for tremor.  She was started on primidone last visit.  She is only on 25 mg daily.  States that she couldn't tolerate the 50 mg.  Was only on it for a few days and she was  sleepy so she reduced it to a half a tablet.   She states that she thinks that the half a tablet works, but still has some trouble with eating, especially if she is nervous.  Medical records have been reviewed since last visit.  She has seen gastroenterology regarding her abdominal pain, which has been worked up extensively.  Abdominal pain was felt likely musculoskeletal in nature by gastroenterology.  It was felt also that she had functional dyspepsia with reflux.  She is currently being worked up for hip pain by Dr. Gladstone Lighter.  She does ask me about memory change.  She previously discussed it with me, but did not want to go out of town for neurocognitive testing.  She thinks today that it is probably just a concentration issue.  Previous meds: primidone (tried few dosages but didn't start at 1/2 tablets and felt nauseated)  No Known Allergies  Outpatient Encounter Medications as of 01/16/2019  Medication Sig  . aspirin 81 MG tablet Take 81 mg by mouth daily.  . citalopram (CELEXA) 20 MG tablet TAKE 1  TABLET BY MOUTH ONCE DAILY  . famotidine (PEPCID) 20 MG tablet Take 1 tablet (20 mg total) by mouth 2 (two) times daily.  . fluticasone (FLOVENT HFA) 110 MCG/ACT inhaler Inhale 1 puff into the lungs 2 (two) times daily.  . hyoscyamine (LEVSIN SL) 0.125 MG SL tablet Place 1 tablet (0.125 mg total) under the tongue every 4 (four) hours as needed for cramping.  Marland Kitchen levothyroxine (SYNTHROID, LEVOTHROID) 50 MCG tablet Take 1 tablet (50 mcg total) by mouth daily.  Marland Kitchen lisinopril (PRINIVIL,ZESTRIL) 20 MG tablet Take 1 tablet (20 mg total) by mouth daily.  . metoprolol succinate (TOPROL-XL) 25 MG 24 hr tablet Take 1 tablet (25 mg total) by mouth daily.  Marland Kitchen omega-3 acid ethyl esters (LOVAZA) 1 g capsule Take 2 g by mouth daily.  . polyethylene glycol (MIRALAX) 17 g packet Take 17 g by mouth daily.  . primidone (MYSOLINE) 50 MG tablet Take 1 tablet (50 mg total) by mouth at bedtime.  . Probiotic Product (PROBIOTIC  DAILY PO) Take by mouth daily.  . sucralfate (CARAFATE) 1 g tablet Take 1 tablet (1 g total) by mouth every 6 (six) hours as needed.  . [DISCONTINUED] ciprofloxacin (CIPRO) 250 MG tablet Take 1 tablet (250 mg total) by mouth 2 (two) times daily. (Patient not taking: Reported on 01/16/2019)  . [DISCONTINUED] gabapentin (NEURONTIN) 100 MG capsule Take 1 tablet (100mg ) every night at bedtime for one week, then increase to 2 tablets (200mg ) every night at bedtime thereafter (Patient not taking: Reported on 01/16/2019)  . [DISCONTINUED] montelukast (  SINGULAIR) 10 MG tablet Take by mouth daily as needed.    No facility-administered encounter medications on file as of 01/16/2019.     Past Medical History:  Diagnosis Date  . Asthma   . Breast cancer ([place])    2002, left, encapsulated, microcalcifications. lumpectomy, radtiation x 30  . Breast cancer in female [facility])   . Change in mole 06/08/2016  . Colitis   . Decreased visual acuity 01/16/2017  . Dyspnea 11/04/2016  . Dysuria 11/04/2016  . Gluten intolerance   . History of chicken pox   . History of Helicobacter pylori infection 08/01/2015  . Hyperlipidemia   . Hypertension   . Hypothyroid   . IBS (irritable bowel syndrome)   . Left leg pain 03/01/2017  . Low back pain 08/10/2015  . Neck pain 03/01/2017  . Osteoporosis   . Personal history of radiation therapy   . RLQ discomfort 03/01/2017  . Stomach cramps   . Urinary tract infection 11/04/2016    Past Surgical History:  Procedure Laterality Date  . ABDOMINAL HYSTERECTOMY     partial  . APPENDECTOMY    . BREAST LUMPECTOMY Left 2002  . CHOLECYSTECTOMY  Age 41 or 10  . COLONOSCOPY  2017  . EYE SURGERY Left    cataract  . FRACTURE SURGERY    . TONSILLECTOMY      Social History   Socioeconomic History  . Marital status: Single    Spouse name: Not on file  . Number of children: 3  . Years of education: Not on file  . Highest education level: Master's degree (e.g., MA, MS, MEng, MEd,  MSW, MBA)  Occupational History  . Occupation: retired    Comment: Pharmacist, hospital, kindergarten  Social Needs  . Financial resource strain: Not on file  . Food insecurity    Worry: Not on file    Inability: Not on file  . Transportation needs    Medical: Not on file    Non-medical: Not on file  Tobacco Use  . Smoking status: Never Smoker  . Smokeless tobacco: Never Used  Substance and Sexual Activity  . Alcohol use: No    Alcohol/week: 0.0 standard drinks  . Drug use: No  . Sexual activity: Not Currently    Birth control/protection: None    Comment: lives alone, avoids daiiry and gluten. 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 Physical activity    Days per week: Not on file    Minutes per session: Not on file  . Stress: Not on file  Relationships  . Social Herbalist on phone: Not on file    Gets together: Not on file    Attends religious service: Not on file    Active member of club or organization: Not on file    Attends meetings of clubs or organizations: Not on file    Relationship status: Not on file  . Intimate partner violence    Fear of current or ex partner: Not on file    Emotionally abused: Not on file    Physically abused: Not on file    Forced sexual activity: Not on file  Other Topics Concern  . Not on file  Social History Narrative  . Not on file    Family Status  Relation Name Status  . Mother  Deceased at age 63  . Father  Deceased  . Brother  Deceased  . PGM  Deceased  . Brother  89, age 66y  . Brother  Alive, age 44y  . Sister  Aloha Gell, age 39y  . Daughter  Alive, age 72y  . MGM  Deceased  . MGF  Deceased  . PGF  Deceased  . Daughter 78 Alive  . Daughter 65 Alive  . Neg Hx  (Not Specified)    Review of Systems Review of Systems  Constitutional: Negative.   HENT: Negative.   Eyes: Negative.   Respiratory: Negative.   Cardiovascular: Negative.   Gastrointestinal: Positive for abdominal pain.  Genitourinary: Negative.    Musculoskeletal: Positive for joint pain.  Skin: Negative.   Psychiatric/Behavioral: Negative.      Objective:   VITALS:   Vitals:   01/16/19 1050  BP: 119/74  Pulse: 74  SpO2: 96%  Weight: 126 lb 6.4 oz (57.3 kg)  Height: 4\' 11"  (1.499 m)    Gen:  Appears stated age and in NAD. HEENT:  Normocephalic, atraumatic. The mucous membranes are moist. The superficial temporal arteries are without ropiness or tenderness. Cardiovascular: RRR Lungs: ctab Neck: no bruits  NEUROLOGICAL:  Orientation:   Montreal Cognitive Assessment  07/14/2018  Visuospatial/ Executive (0/5) 4  Naming (0/3) 3  Attention: Read list of digits (0/2) 1  Attention: Read list of letters (0/1) 1  Attention: Serial 7 subtraction starting at 100 (0/3) 3  Language: Repeat phrase (0/2) 1  Language : Fluency (0/1) 0  Abstraction (0/2) 1  Delayed Recall (0/5) 4  Orientation (0/6) 6  Total 24  Adjusted Score (based on education) 24   Cranial nerves: There is good facial symmetry.  Extraocular muscles are intact and visual fields are full to confrontational testing. Speech is fluent and clear. Soft palate rises symmetrically and there is no tongue deviation. Hearing is intact to conversational tone. Tone: Tone is good throughout. Sensation: Sensation is intact to light touch to light touch throughout.  Vibration is intact at the bilateral big toe.   Coordination:  The patient has no difficulty with RAM's or FNF bilaterally.  She does have some slow toe taps on the left. Motor: Strength is at least antigravity x 4 Gait and Station: The patient pushes off of the chair.  She ambulates well with her walker.  The left arm is held in flexion when she ambulates.  MOVEMENT EXAM: Tremor:  There is no rest tremor, even with distraction procedures.  There is mild postural tremor bilaterally.  She has tremor with Archimedes spirals bilaterally, although fairly mild.  Labs:  Lab Results  Component Value Date   TSH 1.65  05/04/2018     Chemistry      Component Value Date/Time   NA 135 12/12/2018 1432   K 4.4 12/12/2018 1432   CL 103 12/12/2018 1432   CO2 23 12/12/2018 1432   BUN 27 (H) 12/12/2018 1432   CREATININE 1.10 12/12/2018 1432   CREATININE 1.20 (H) 07/15/2017 1603      Component Value Date/Time   CALCIUM 8.9 12/12/2018 1432   ALKPHOS 52 12/12/2018 1432   AST 24 12/12/2018 1432   ALT 16 12/12/2018 1432   BILITOT 0.4 12/12/2018 1432     Lab Results  Component Value Date   [id number] (H) 07/14/2018   Lab Results  Component Value Date   FOLATE 22.0 07/14/2018        Assessment/Plan:   1.  Essential Tremor.  -She tried primidone, 50 mg for a few days and thought it caused sleepiness.  Therefore, she is only on 25 mg at bedtime.  I told  her to try the 50 mg again, or to take 25 mg twice per day.  She will let me know how she does.    -No evidence of Parkinson's disease and does not meet [place] brain bank criteria for the diagnosis, but she does ambulate with the left arm in flexion, so I am monitoring her. 2.  Migraine without aura, L eye  -Long history.  No change. 3.  Memory change  -Refer for neurocognitive testing. 4.  Left hip pain  -Following with Dr. Gladstone Lighter 5.  I will plan on seeing her back in the next 6 to 8 months.  She will follow-up with Dr. Melvyn Novas before that time for neurocognitive testing.    CC:  [name], MD

## 2019-01-16 ENCOUNTER — Ambulatory Visit: Payer: Medicare HMO | Admitting: Neurology

## 2019-01-16 ENCOUNTER — Encounter: Payer: Self-pay | Admitting: Neurology

## 2019-01-16 VITALS — BP 119/74 | HR 74 | Ht 59.0 in | Wt 126.4 lb

## 2019-01-16 DIAGNOSIS — G250 Essential tremor: Secondary | ICD-10-CM | POA: Diagnosis not present

## 2019-01-16 DIAGNOSIS — M25552 Pain in left hip: Secondary | ICD-10-CM | POA: Diagnosis not present

## 2019-01-16 DIAGNOSIS — R413 Other amnesia: Secondary | ICD-10-CM

## 2019-01-16 NOTE — Patient Instructions (Addendum)
You can try to increase the primidone, 50 mg, 1/2 tablet twice per day or you can take 1 pill at bedtime, whichever is better for you.    You have been referred for a neurocognitive evaluation in our office.   The evaluation has two parts.   . The first part of the evaluation is a clinical interview with the neuropsychologist (Dr. Melvyn Novas or Dr. Nicole Kindred). Please bring someone with you to this appointment if possible, as it is helpful for the doctor to hear from both you and another adult who knows you well.   . The second part of the evaluation is testing with the doctor's technician [name] or [place]). The testing includes a variety of tasks- mostly question-and-answer, some paper-and-pencil. There is nothing you need to do to prepare for this appointment, but having a good night's sleep prior to the testing, and bringing eyeglasses and hearing aids (if you wear them), is advised. Please make sure that you wear a mask to the appointment.  Please note: We have to reserve several hours of the neuropsychologist's time and the psychometrician's time for your evaluation appointment. As such, please note that there is a No-Show fee of $100. If you are unable to attend any of your appointments, please contact our office as soon as possible to reschedule.

## 2019-01-17 DIAGNOSIS — M5442 Lumbago with sciatica, left side: Secondary | ICD-10-CM | POA: Diagnosis not present

## 2019-01-17 DIAGNOSIS — M545 Low back pain: Secondary | ICD-10-CM | POA: Diagnosis not present

## 2019-01-17 DIAGNOSIS — G8929 Other chronic pain: Secondary | ICD-10-CM | POA: Diagnosis not present

## 2019-01-19 DIAGNOSIS — M545 Low back pain: Secondary | ICD-10-CM | POA: Diagnosis not present

## 2019-01-19 DIAGNOSIS — G8929 Other chronic pain: Secondary | ICD-10-CM | POA: Diagnosis not present

## 2019-01-19 DIAGNOSIS — M5442 Lumbago with sciatica, left side: Secondary | ICD-10-CM | POA: Diagnosis not present

## 2019-01-22 DIAGNOSIS — M25552 Pain in left hip: Secondary | ICD-10-CM | POA: Diagnosis not present

## 2019-01-23 ENCOUNTER — Ambulatory Visit: Payer: Medicare HMO | Admitting: Family Medicine

## 2019-01-23 DIAGNOSIS — M545 Low back pain: Secondary | ICD-10-CM | POA: Diagnosis not present

## 2019-01-23 DIAGNOSIS — G8929 Other chronic pain: Secondary | ICD-10-CM | POA: Diagnosis not present

## 2019-01-23 DIAGNOSIS — M5442 Lumbago with sciatica, left side: Secondary | ICD-10-CM | POA: Diagnosis not present

## 2019-01-24 DIAGNOSIS — M1612 Unilateral primary osteoarthritis, left hip: Secondary | ICD-10-CM | POA: Diagnosis not present

## 2019-01-24 DIAGNOSIS — M25552 Pain in left hip: Secondary | ICD-10-CM | POA: Diagnosis not present

## 2019-01-30 ENCOUNTER — Other Ambulatory Visit: Payer: Self-pay

## 2019-02-01 ENCOUNTER — Encounter: Payer: Self-pay | Admitting: Family Medicine

## 2019-02-01 ENCOUNTER — Ambulatory Visit (INDEPENDENT_AMBULATORY_CARE_PROVIDER_SITE_OTHER): Payer: Medicare HMO | Admitting: Family Medicine

## 2019-02-01 ENCOUNTER — Other Ambulatory Visit: Payer: Self-pay

## 2019-02-01 VITALS — BP 121/81 | HR 56 | Temp 97.7°F | Resp 16 | Ht <= 58 in | Wt 128.0 lb

## 2019-02-01 DIAGNOSIS — R0789 Other chest pain: Secondary | ICD-10-CM

## 2019-02-01 DIAGNOSIS — Q245 Malformation of coronary vessels: Secondary | ICD-10-CM

## 2019-02-01 DIAGNOSIS — Z1239 Encounter for other screening for malignant neoplasm of breast: Secondary | ICD-10-CM

## 2019-02-01 DIAGNOSIS — R9230 Dense breasts, unspecified: Secondary | ICD-10-CM

## 2019-02-01 DIAGNOSIS — D649 Anemia, unspecified: Secondary | ICD-10-CM

## 2019-02-01 DIAGNOSIS — I10 Essential (primary) hypertension: Secondary | ICD-10-CM

## 2019-02-01 DIAGNOSIS — R922 Inconclusive mammogram: Secondary | ICD-10-CM

## 2019-02-01 DIAGNOSIS — R739 Hyperglycemia, unspecified: Secondary | ICD-10-CM | POA: Diagnosis not present

## 2019-02-01 DIAGNOSIS — E782 Mixed hyperlipidemia: Secondary | ICD-10-CM

## 2019-02-01 DIAGNOSIS — E559 Vitamin D deficiency, unspecified: Secondary | ICD-10-CM | POA: Diagnosis not present

## 2019-02-01 DIAGNOSIS — M25552 Pain in left hip: Secondary | ICD-10-CM

## 2019-02-01 DIAGNOSIS — E039 Hypothyroidism, unspecified: Secondary | ICD-10-CM

## 2019-02-01 DIAGNOSIS — Z23 Encounter for immunization: Secondary | ICD-10-CM

## 2019-02-01 DIAGNOSIS — Z01818 Encounter for other preprocedural examination: Secondary | ICD-10-CM

## 2019-02-01 LAB — CBC
HCT: 33.2 % — ABNORMAL LOW (ref 36.0–46.0)
Hemoglobin: 11 g/dL — ABNORMAL LOW (ref 12.0–15.0)
MCHC: 33.1 g/dL (ref 30.0–36.0)
MCV: 88 fl (ref 78.0–100.0)
Platelets: 221 10*3/uL (ref 150.0–400.0)
RBC: 3.78 Mil/uL — ABNORMAL LOW (ref 3.87–5.11)
RDW: 15.2 % (ref 11.5–15.5)
WBC: 6.4 10*3/uL (ref 4.0–10.5)

## 2019-02-01 LAB — TSH: TSH: 1.05 u[IU]/mL (ref 0.35–4.50)

## 2019-02-01 LAB — COMPREHENSIVE METABOLIC PANEL
ALT: 14 U/L (ref 0–35)
AST: 19 U/L (ref 0–37)
Albumin: 4 g/dL (ref 3.5–5.2)
Alkaline Phosphatase: 64 U/L (ref 39–117)
BUN: 22 mg/dL (ref 6–23)
CO2: 26 mEq/L (ref 19–32)
Calcium: 8.9 mg/dL (ref 8.4–10.5)
Chloride: 101 mEq/L (ref 96–112)
Creatinine, Ser: 1.19 mg/dL (ref 0.40–1.20)
GFR: 43.96 mL/min — ABNORMAL LOW (ref >60.00)
Glucose, Bld: 86 mg/dL (ref 70–99)
Potassium: 4.9 mEq/L (ref 3.5–5.1)
Sodium: 135 mEq/L (ref 135–145)
Total Bilirubin: 0.3 mg/dL (ref 0.2–1.2)
Total Protein: 6.7 g/dL (ref 6.0–8.3)

## 2019-02-01 LAB — LIPID PANEL
Cholesterol: 238 mg/dL — ABNORMAL HIGH (ref 0–200)
HDL: 59.4 mg/dL (ref >39.00)
LDL Cholesterol: 141 mg/dL — ABNORMAL HIGH (ref 0–99)
NonHDL: 178.13
Total CHOL/HDL Ratio: 4
Triglycerides: 188 mg/dL — ABNORMAL HIGH (ref 0.0–149.0)
VLDL: 37.6 mg/dL (ref 0.0–40.0)

## 2019-02-01 LAB — HEMOGLOBIN A1C: Hgb A1c MFr Bld: 6.2 % (ref 4.6–6.5)

## 2019-02-01 LAB — VITAMIN D 25 HYDROXY (VIT D DEFICIENCY, FRACTURES): VITD: 40.59 ng/mL (ref 30.00–100.00)

## 2019-02-01 NOTE — Assessment & Plan Note (Signed)
hgba1c acceptable, min mize simple carbs. Increase exercise as tolerated.

## 2019-02-01 NOTE — Patient Instructions (Signed)

## 2019-02-01 NOTE — Assessment & Plan Note (Signed)
Encouraged heart healthy diet, increase exercise, avoid trans fats, consider a krill oil cap daily 

## 2019-02-01 NOTE — Assessment & Plan Note (Signed)
On Levothyroxine, continue to monitor 

## 2019-02-04 DIAGNOSIS — R079 Chest pain, unspecified: Secondary | ICD-10-CM | POA: Insufficient documentation

## 2019-02-04 DIAGNOSIS — R0789 Other chest pain: Secondary | ICD-10-CM

## 2019-02-04 HISTORY — DX: Other chest pain: R07.89

## 2019-02-04 NOTE — Assessment & Plan Note (Signed)
She has had some recent episodes of sharp chest pain, has had them previously but they have now recurred. She is referred to cardiology for evaluation and she will seek care if recurs and persists

## 2019-02-04 NOTE — Assessment & Plan Note (Signed)
Increase leafy greens, consider increased lean red meat and using cast iron cookware. Continue to monitor, report any concerns 

## 2019-02-04 NOTE — Assessment & Plan Note (Signed)
Is anxious to proceed with total hip replacement with Dr Alvan Dame but with recent chest pain will have her evaluated by cardiology before being completely cleared.

## 2019-02-04 NOTE — Assessment & Plan Note (Signed)
Well controlled, no changes to meds. Encouraged heart healthy diet such as the DASH diet and exercise as tolerated.  °

## 2019-02-04 NOTE — Progress Notes (Signed)
Subjective:    Patient ID: Julie Wilkerson, female    DOB: 24-Jun-1940, 78 y.o.   MRN: [id number]  Chief Complaint  Patient presents with  . 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 No recent febrile illness or hospitalizations. She is noting some intermittent chest pain, sharp and occurring more frequently recently. Has had similar episodes in the past. Is noting increased anxiety and depression with the pandemic. She is hoping to get cleared for orthopaedics to perform her left hip arthroplasty. She is continuing to have some abdominal pain but it has improved some. Denies palp/SOB/HA/congestion/fevers or GU c/o. Taking meds as prescribed  Past Medical History:  Diagnosis Date  . Asthma   . Breast cancer ([place])    2002, left, encapsulated, microcalcifications. lumpectomy, radtiation x 30  . Breast cancer in female [facility])   . Change in mole 06/08/2016  . Colitis   . Decreased visual acuity 01/16/2017  . Dyspnea 11/04/2016  . Dysuria 11/04/2016  . Gluten intolerance   . History of chicken pox   . History of Helicobacter pylori infection 08/01/2015  . Hyperlipidemia   . Hypertension   . Hypothyroid   . IBS (irritable bowel syndrome)   . Left leg pain 03/01/2017  . Low back pain 08/10/2015  . Neck pain 03/01/2017  . Osteoporosis   . Personal history of radiation therapy   . RLQ discomfort 03/01/2017  . Stomach cramps   . Urinary tract infection 11/04/2016    Past Surgical History:  Procedure Laterality Date  . ABDOMINAL HYSTERECTOMY     partial  . APPENDECTOMY    . BREAST LUMPECTOMY Left 2002  . CHOLECYSTECTOMY  Age 44 or 39  . COLONOSCOPY  2017  . EYE SURGERY Left    cataract  . FRACTURE SURGERY    . TONSILLECTOMY      Family History  Problem Relation Age of Onset  . Colitis Mother   . Irritable bowel syndrome Mother   . Hypertension Father   .  Hyperlipidemia Brother   . Heart attack Brother   . Stomach cancer Paternal Grandmother 5  . Hyperlipidemia Brother   . Heart attack Brother   . Hyperlipidemia Brother   . Heart attack Brother   . Hyperlipidemia Sister   . Leukemia Daughter   . Arthritis Daughter   . Heart disease Daughter        ASD vs VSD  . Colon cancer Neg Hx     Social History   Socioeconomic History  . Marital status: Single    Spouse name: Not on file  . Number of children: 3  . Years of education: Not on file  . Highest education level: Master's degree (e.g., MA, MS, MEng, MEd, MSW, MBA)  Occupational History  . Occupation: retired    Comment: Pharmacist, hospital, kindergarten  Social Needs  . Financial resource strain: Not on file  . Food insecurity    Worry: Not on file    Inability: Not on file  . Transportation needs    Medical: Not on file    Non-medical: Not on file  Tobacco Use  . Smoking status: Never Smoker  . Smokeless tobacco: Never Used  Substance and Sexual Activity  . Alcohol use: No    Alcohol/week: 0.0 standard drinks  . Drug use: No  . Sexual activity: Not Currently    Birth control/protection: None  Comment: lives alone, avoids daiiry and gluten. 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 Physical activity    Days per week: Not on file    Minutes per session: Not on file  . Stress: Not on file  Relationships  . Social Herbalist on phone: Not on file    Gets together: Not on file    Attends religious service: Not on file    Active member of club or organization: Not on file    Attends meetings of clubs or organizations: Not on file    Relationship status: Not on file  . Intimate partner violence    Fear of current or ex partner: Not on file    Emotionally abused: Not on file    Physically abused: Not on file    Forced sexual activity: Not on file  Other Topics Concern  . Not on file  Social History Narrative  . Not on file    Outpatient Medications Prior to  Visit  Medication Sig Dispense Refill  . aspirin 81 MG tablet Take 81 mg by mouth daily.    . citalopram (CELEXA) 20 MG tablet TAKE 1  TABLET BY MOUTH ONCE DAILY 90 tablet 1  . famotidine (PEPCID) 20 MG tablet Take 1 tablet (20 mg total) by mouth 2 (two) times daily. 60 tablet 3  . fluticasone (FLOVENT HFA) 110 MCG/ACT inhaler Inhale 1 puff into the lungs 2 (two) times daily. 1 Inhaler 12  . hyoscyamine (LEVSIN SL) 0.125 MG SL tablet Place 1 tablet (0.125 mg total) under the tongue every 4 (four) hours as needed for cramping. 30 tablet 1  . levothyroxine (SYNTHROID, LEVOTHROID) 50 MCG tablet Take 1 tablet (50 mcg total) by mouth daily. 90 tablet 1  . lisinopril (PRINIVIL,ZESTRIL) 20 MG tablet Take 1 tablet (20 mg total) by mouth daily. 90 tablet 3  . metoprolol succinate (TOPROL-XL) 25 MG 24 hr tablet Take 1 tablet (25 mg total) by mouth daily. 30 tablet 11  . omega-3 acid ethyl esters (LOVAZA) 1 g capsule Take 2 g by mouth daily.    . polyethylene glycol (MIRALAX) 17 g packet Take 17 g by mouth daily. 14 each 0  . primidone (MYSOLINE) 50 MG tablet Take 1 tablet (50 mg total) by mouth at bedtime. 90 tablet 1  . Probiotic Product (PROBIOTIC DAILY PO) Take by mouth daily.    . sucralfate (CARAFATE) 1 g tablet Take 1 tablet (1 g total) by mouth every 6 (six) hours as needed. 60 tablet 2   No facility-administered medications prior to visit.     No Known Allergies  Review of Systems  Constitutional: Positive for malaise/fatigue. Negative for fever.  HENT: Negative for congestion.   Eyes: Negative for blurred vision.  Respiratory: Negative for shortness of breath.   Cardiovascular: Positive for chest pain. Negative for palpitations and leg swelling.  Gastrointestinal: Positive for abdominal pain. Negative for blood in stool and nausea.  Genitourinary: Negative for dysuria and frequency.  Musculoskeletal: Positive for joint pain. Negative for falls.  Skin: Negative for rash.  Neurological:  Negative for dizziness, loss of consciousness and headaches.  Endo/Heme/Allergies: Negative for environmental allergies.  Psychiatric/Behavioral: Positive for depression. The patient is nervous/anxious.        Objective:    Physical Exam Vitals signs and nursing note reviewed.  Constitutional:      General: She is not in acute distress.    Appearance: She is well-developed.  HENT:  Head: Normocephalic and atraumatic.     Nose: Nose normal.  Eyes:     General:        Right eye: No discharge.        Left eye: No discharge.  Neck:     Musculoskeletal: Normal range of motion and neck supple.  Cardiovascular:     Rate and Rhythm: Normal rate and regular rhythm.     Heart sounds: No murmur.  Pulmonary:     Effort: Pulmonary effort is normal.     Breath sounds: Normal breath sounds.  Abdominal:     General: Bowel sounds are normal.     Palpations: Abdomen is soft.     Tenderness: There is no abdominal tenderness.  Skin:    General: Skin is warm and dry.  Neurological:     Mental Status: She is alert and oriented to person, place, and time.     BP 121/81   Pulse (!) 56   Temp 97.7 F (36.5 C) (Tympanic)   Resp 16   Ht 4\' 10"  (1.473 m)   Wt 128 lb (58.1 kg)   SpO2 97%   BMI 26.75 kg/m  Wt Readings from Last 3 Encounters:  02/01/19 128 lb (58.1 kg)  01/16/19 126 lb 6.4 oz (57.3 kg)  12/12/18 123 lb (55.8 kg)    Diabetic Foot Exam - Simple   No data filed     Lab Results  Component Value Date   WBC 6.4 02/01/2019   HGB 11.0 (L) 02/01/2019   HCT 33.2 (L) 02/01/2019   PLT 221.0 02/01/2019   GLUCOSE 86 02/01/2019   CHOL 238 (H) 02/01/2019   TRIG 188.0 (H) 02/01/2019   HDL 59.40 02/01/2019   LDLCALC 141 (H) 02/01/2019   ALT 14 02/01/2019   AST 19 02/01/2019   NA 135 02/01/2019   K 4.9 02/01/2019   CL 101 02/01/2019   CREATININE 1.19 02/01/2019   BUN 22 02/01/2019   CO2 26 02/01/2019   TSH 1.05 02/01/2019   HGBA1C 6.2 02/01/2019    Lab Results   Component Value Date   TSH 1.05 02/01/2019   Lab Results  Component Value Date   WBC 6.4 02/01/2019   HGB 11.0 (L) 02/01/2019   HCT 33.2 (L) 02/01/2019   MCV 88.0 02/01/2019   PLT 221.0 02/01/2019   Lab Results  Component Value Date   NA 135 02/01/2019   K 4.9 02/01/2019   CO2 26 02/01/2019   GLUCOSE 86 02/01/2019   BUN 22 02/01/2019   CREATININE 1.19 02/01/2019   BILITOT 0.3 02/01/2019   ALKPHOS 64 02/01/2019   AST 19 02/01/2019   ALT 14 02/01/2019   PROT 6.7 02/01/2019   ALBUMIN 4.0 02/01/2019   CALCIUM 8.9 02/01/2019   ANIONGAP 7 06/08/2018   GFR 43.96 (L) 02/01/2019   Lab Results  Component Value Date   CHOL 238 (H) 02/01/2019   Lab Results  Component Value Date   HDL 59.40 02/01/2019   Lab Results  Component Value Date   LDLCALC 141 (H) 02/01/2019   Lab Results  Component Value Date   TRIG 188.0 (H) 02/01/2019   Lab Results  Component Value Date   CHOLHDL 4 02/01/2019   Lab Results  Component Value Date   HGBA1C 6.2 02/01/2019       Assessment & Plan:   Problem List Items Addressed This Visit    Hyperlipidemia    Encouraged heart healthy diet, increase exercise, avoid trans fats, consider a  krill oil cap daily      Relevant Orders   Lipid panel (Completed)   Essential hypertension - Primary    Well controlled, no changes to meds. Encouraged heart healthy diet such as the DASH diet and exercise as tolerated.       Relevant Orders   CBC (Completed)   Comprehensive metabolic panel (Completed)   TSH (Completed)   Ambulatory referral to Cardiology   Hypothyroid    On Levothyroxine, continue to monitor      Vitamin D deficiency   Relevant Orders   VITAMIN D 25 Hydroxy (Vit-D Deficiency, Fractures) (Completed)   Hyperglycemia    hgba1c acceptable, min mize simple carbs. Increase exercise as tolerated.       Relevant Orders   Hemoglobin A1c (Completed)   Myocardial bridge   Relevant Orders   Ambulatory referral to Cardiology    Left hip pain    Is anxious to proceed with total hip replacement with Dr Alvan Dame but with recent chest pain will have her evaluated by cardiology before being completely cleared.      Anemia    Increase leafy greens, consider increased lean red meat and using cast iron cookware. Continue to monitor, report any concerns      Atypical chest pain   Relevant Orders   Ambulatory referral to Cardiology    Other Visit Diagnoses    Pre-operative clearance       Relevant Orders   Ambulatory referral to Cardiology   Dense breast tissue on mammogram       Relevant Orders   MM 3D SCREEN BREAST BILATERAL   Breast cancer screening       Relevant Orders   MM 3D SCREEN BREAST BILATERAL   Need for pneumococcal vaccination       Relevant Orders   Pneumococcal conjugate vaccine 13-valent IM (Completed)   Need for influenza vaccination       Relevant Orders   Flu Vaccine QUAD High Dose(Fluad) (Completed)      I am having Inez Catalina S. Schilz maintain her aspirin, Probiotic Product (PROBIOTIC DAILY PO), lisinopril, metoprolol succinate, levothyroxine, citalopram, hyoscyamine, primidone, fluticasone, omega-3 acid ethyl esters, famotidine, sucralfate, and polyethylene glycol.  No orders of the defined types were placed in this encounter.    Penni Homans, MD

## 2019-02-05 ENCOUNTER — Ambulatory Visit (INDEPENDENT_AMBULATORY_CARE_PROVIDER_SITE_OTHER): Payer: Medicare HMO | Admitting: Cardiology

## 2019-02-05 ENCOUNTER — Encounter: Payer: Self-pay | Admitting: Cardiology

## 2019-02-05 ENCOUNTER — Other Ambulatory Visit: Payer: Self-pay

## 2019-02-05 VITALS — BP 108/58 | HR 58 | Temp 98.2°F | Ht <= 58 in | Wt 126.8 lb

## 2019-02-05 DIAGNOSIS — R0789 Other chest pain: Secondary | ICD-10-CM | POA: Diagnosis not present

## 2019-02-05 DIAGNOSIS — E782 Mixed hyperlipidemia: Secondary | ICD-10-CM | POA: Diagnosis not present

## 2019-02-05 DIAGNOSIS — I729 Aneurysm of unspecified site: Secondary | ICD-10-CM

## 2019-02-05 DIAGNOSIS — Z01812 Encounter for preprocedural laboratory examination: Secondary | ICD-10-CM | POA: Diagnosis not present

## 2019-02-05 DIAGNOSIS — T81718A Complication of other artery following a procedure, not elsewhere classified, initial encounter: Secondary | ICD-10-CM | POA: Diagnosis not present

## 2019-02-05 DIAGNOSIS — I10 Essential (primary) hypertension: Secondary | ICD-10-CM

## 2019-02-05 DIAGNOSIS — R079 Chest pain, unspecified: Secondary | ICD-10-CM

## 2019-02-05 NOTE — Patient Instructions (Addendum)
DUE TO COVID-19 ONLY ONE VISITOR IS ALLOWED TO COME WITH YOU AND STAY IN THE WAITING ROOM ONLY DURING PRE OP AND PROCEDURE DAY OF SURGERY. THE 1 VISITOR MAY VISIT WITH YOU AFTER SURGERY IN YOUR PRIVATE ROOM DURING VISITING HOURS ONLY!  YOU NEED TO HAVE A COVID 19 TEST ON_______ @_______ , THIS TEST MUST BE DONE BEFORE SURGERY, COME  [place] , [id number].  ([name]) ONCE YOUR COVID TEST IS COMPLETED, PLEASE BEGIN THE QUARANTINE INSTRUCTIONS AS OUTLINED IN YOUR HANDOUT.                Julie Wilkerson  02/05/2019   Your procedure is scheduled on: 02-13-19   Report to [facility] Main  Entrance             Report to admitting at        1025 AM     Call this number if you have problems the morning of surgery [phone]    Remember: NO SOLID FOOD AFTER MIDNIGHT THE NIGHT PRIOR TO SURGERY. NOTHING BY MOUTH EXCEPT CLEAR LIQUIDS UNTIL     1000 am . PLEASE FINISH ENSURE DRINK PER SURGEON ORDER  WHICH NEEDS TO BE COMPLETED AT      1000 am.      CLEAR LIQUID DIET   Foods Allowed                                                                                             Foods Excluded  Coffee and tea, regular and decaf    No creamer                                  liquids that you cannot  Plain Jell-O any favor except red or purple                                           see through such as: Fruit ices (not with fruit pulp)                                                                   milk, soups, orange juice  Iced Popsicles                                                                  All solid food Carbonated beverages, regular and diet  Cranberry, grape and apple juices Sports drinks like Gatorade Lightly seasoned clear broth or consume(fat free) Sugar, honey syrup  _____________________________________________________________________   BRUSH YOUR TEETH MORNING OF SURGERY AND RINSE YOUR MOUTH  OUT, NO CHEWING GUM CANDY OR MINTS.     Take these medicines the morning of surgery with A SIP OF WATER: levothyroxine, pepcid, inhaler bring with you                                 You may not have any metal on your body including hair pins and              piercings  Do not wear jewelry, make-up, lotions, powders or perfumes, deodorant             Do not wear nail polish.  Do not shave  48 hours prior to surgery.              Do not bring valuables to the hospital. [place].  Contacts, dentures or bridgework may not be worn into surgery.                 Please read over the following fact sheets you were given: _____________________________________________________________________          [facility] - Preparing for Surgery Before surgery, you can play an important role.  Because skin is not sterile, your skin needs to be as free of germs as possible.  You can reduce the number of germs on your skin by washing with CHG (chlorahexidine gluconate) soap before surgery.  CHG is an antiseptic cleaner which kills germs and bonds with the skin to continue killing germs even after washing. Please DO NOT use if you have an allergy to CHG or antibacterial soaps.  If your skin becomes reddened/irritated stop using the CHG and inform your nurse when you arrive at Short Stay. Do not shave (including legs and underarms) for at least 48 hours prior to the first CHG shower.  You may shave your face/neck. Please follow these instructions carefully:  1.  Shower with CHG Soap the night before surgery and the  morning of Surgery.  2.  If you choose to wash your hair, wash your hair first as usual with your  normal  shampoo.  3.  After you shampoo, rinse your hair and body thoroughly to remove the  shampoo.                           4.  Use CHG as you would any other liquid soap.  You can apply chg directly  to the skin and wash                        Gently with a scrungie or clean washcloth.  5.  Apply the CHG Soap to your body ONLY FROM THE NECK DOWN.   Do not use on face/ open                           Wound or open sores. Avoid contact with eyes, ears mouth and genitals (private parts).  Wash face,  Genitals (private parts) with your normal soap.             6.  Wash thoroughly, paying special attention to the area where your surgery  will be performed.  7.  Thoroughly rinse your body with warm water from the neck down.  8.  DO NOT shower/wash with your normal soap after using and rinsing off  the CHG Soap.                9.  Pat yourself dry with a clean towel.            10.  Wear clean pajamas.            11.  Place clean sheets on your bed the night of your first shower and do not  sleep with pets. Day of Surgery : Do not apply any lotions/deodorants the morning of surgery.  Please wear clean clothes to the hospital/surgery center.  FAILURE TO FOLLOW THESE INSTRUCTIONS MAY RESULT IN THE CANCELLATION OF YOUR SURGERY PATIENT SIGNATURE_________________________________  NURSE SIGNATURE__________________________________  ________________________________________________________________________   Adam Phenix  An incentive spirometer is a tool that can help keep your lungs clear and active. This tool measures how well you are filling your lungs with each breath. Taking long deep breaths may help reverse or decrease the chance of developing breathing (pulmonary) problems (especially infection) following:  A long period of time when you are unable to move or be active. BEFORE THE PROCEDURE   If the spirometer includes an indicator to show your best effort, your nurse or respiratory therapist will set it to a desired goal.  If possible, sit up straight or lean slightly forward. Try not to slouch.  Hold the incentive spirometer in an upright position. INSTRUCTIONS FOR USE  1. Sit on the edge of your bed  if possible, or sit up as far as you can in bed or on a chair. 2. Hold the incentive spirometer in an upright position. 3. Breathe out normally. 4. Place the mouthpiece in your mouth and seal your lips tightly around it. 5. Breathe in slowly and as deeply as possible, raising the piston or the ball toward the top of the column. 6. Hold your breath for 3-5 seconds or for as long as possible. Allow the piston or ball to fall to the bottom of the column. 7. Remove the mouthpiece from your mouth and breathe out normally. 8. Rest for a few seconds and repeat Steps 1 through 7 at least 10 times every 1-2 hours when you are awake. Take your time and take a few normal breaths between deep breaths. 9. The spirometer may include an indicator to show your best effort. Use the indicator as a goal to work toward during each repetition. 10. After each set of 10 deep breaths, practice coughing to be sure your lungs are clear. If you have an incision (the cut made at the time of surgery), support your incision when coughing by placing a pillow or rolled up towels firmly against it. Once you are able to get out of bed, walk around indoors and cough well. You may stop using the incentive spirometer when instructed by your caregiver.  RISKS AND COMPLICATIONS  Take your time so you do not get dizzy or light-headed.  If you are in pain, you may need to take or ask for pain medication before doing incentive spirometry. It is harder to take a deep breath if you are having pain.  AFTER USE  Rest and breathe slowly and easily.  It can be helpful to keep track of a log of your progress. Your caregiver can provide you with a simple table to help with this. If you are using the spirometer at home, follow these instructions: [place] IF:   You are having difficultly using the spirometer.  You have trouble using the spirometer as often as instructed.  Your pain medication is not giving enough relief while  using the spirometer.  You develop fever of 100.5 F (38.1 C) or higher. SEEK IMMEDIATE MEDICAL CARE IF:   You cough up bloody sputum that had not been present before.  You develop fever of 102 F (38.9 C) or greater.  You develop worsening pain at or near the incision site. MAKE SURE YOU:   Understand these instructions.  Will watch your condition.  Will get help right away if you are not doing well or get worse. Document Released: 09/20/2006 Document Revised: 08/02/2011 Document Reviewed: 11/21/2006 ExitCare Patient Information 2014 ExitCare, [place].   ________________________________________________________________________  WHAT IS A BLOOD TRANSFUSION? Blood Transfusion Information  A transfusion is the replacement of blood or some of its parts. Blood is made up of multiple cells which provide different functions.  Red blood cells carry oxygen and are used for blood loss replacement.  White blood cells fight against infection.  Platelets control bleeding.  Plasma helps clot blood.  Other blood products are available for specialized needs, such as hemophilia or other clotting disorders. BEFORE THE TRANSFUSION  Who gives blood for transfusions?   Healthy volunteers who are fully evaluated to make sure their blood is safe. This is blood bank blood. Transfusion therapy is the safest it has ever been in the practice of medicine. Before blood is taken from a donor, a complete history is taken to make sure that person has no history of diseases nor engages in risky social behavior (examples are intravenous drug use or sexual activity with multiple partners). The donor's travel history is screened to minimize risk of transmitting infections, such as malaria. The donated blood is tested for signs of infectious diseases, such as HIV and hepatitis. The blood is then tested to be sure it is compatible with you in order to minimize the chance of a transfusion reaction. If you or a  relative donates blood, this is often done in anticipation of surgery and is not appropriate for emergency situations. It takes many days to process the donated blood. RISKS AND COMPLICATIONS Although transfusion therapy is very safe and saves many lives, the main dangers of transfusion include:   Getting an infectious disease.  Developing a transfusion reaction. This is an allergic reaction to something in the blood you were given. Every precaution is taken to prevent this. The decision to have a blood transfusion has been considered carefully by your caregiver before blood is given. Blood is not given unless the benefits outweigh the risks. AFTER THE TRANSFUSION  Right after receiving a blood transfusion, you will usually feel much better and more energetic. This is especially true if your red blood cells have gotten low (anemic). The transfusion raises the level of the red blood cells which carry oxygen, and this usually causes an energy increase.  The nurse administering the transfusion will monitor you carefully for complications. HOME CARE INSTRUCTIONS  No special instructions are needed after a transfusion. You may find your energy is better. Speak with your caregiver about any limitations on activity for underlying diseases  you may have. SEEK MEDICAL CARE IF:   Your condition is not improving after your transfusion.  You develop redness or irritation at the intravenous (IV) site. SEEK IMMEDIATE MEDICAL CARE IF:  Any of the following symptoms occur over the next 12 hours:  Shaking chills.  You have a temperature by mouth above 102 F (38.9 C), not controlled by medicine.  Chest, back, or muscle pain.  People around you feel you are not acting correctly or are confused.  Shortness of breath or difficulty breathing.  Dizziness and fainting.  You get a rash or develop hives.  You have a decrease in urine output.  Your urine turns a dark color or changes to pink, red, or  brown. Any of the following symptoms occur over the next 10 days:  You have a temperature by mouth above 102 F (38.9 C), not controlled by medicine.  Shortness of breath.  Weakness after normal activity.  The white part of the eye turns yellow (jaundice).  You have a decrease in the amount of urine or are urinating less often.  Your urine turns a dark color or changes to pink, red, or brown. Document Released: 05/07/2000 Document Revised: 08/02/2011 Document Reviewed: 12/25/2007 [place] Patient Information 2014 [place], [place].  _______________________________________________________________________

## 2019-02-05 NOTE — Progress Notes (Signed)
Cardiology Office Note:    Date:  02/05/2019   ID:  Julie Wilkerson, DOB [birth year], MRN [id number]  PCP:  [name], MD  Cardiologist:  No primary care provider on file.  Electrophysiologist:  None   Referring MD: [name], MD   "I have been experiencing chest pain"  History of Present Illness:    Julie Wilkerson is a 78 y.o. female with a hx of hypertension and a significant family history of coronary artery disease presents today to be evaluated for preoperative clearance.  The patient does tell me that she has been experiencing chest pain for about 6 months now.  He states that initially when the pain started it was on exertion it was a left-sided dull pain which lasted for about 2 to 3 minutes.  She notes that she would take aspirin and get resolution to the pain. However in the last 2 months the pain is now occurring at rest this last about 10-15 minutes and has not gotten any relief with aspirin in recent weeks.  She tells me is occurring frequently at least about 1 to twice weekly and notes that she is experiencing shortness of breath with radiation to her left arm with this pain now.  She admits to occasional lightheadedness.  She denies any palpitations, nausea, vomiting. She is not experiencing chest pain during our encounter.  Past Medical History:  Diagnosis Date  . Asthma   . Breast cancer ([place])    2002, left, encapsulated, microcalcifications. lumpectomy, radtiation x 30  . Breast cancer in female [facility])   . Change in mole 06/08/2016  . Colitis   . Decreased visual acuity 01/16/2017  . Dyspnea 11/04/2016  . Dysuria 11/04/2016  . Gluten intolerance   . History of chicken pox   . History of Helicobacter pylori infection 08/01/2015  . Hyperlipidemia   . Hypertension   . Hypothyroid   . IBS (irritable bowel syndrome)   . Left leg pain 03/01/2017  . Low back pain 08/10/2015  . Neck pain 03/01/2017  . Osteoporosis   . Personal history of radiation therapy    . RLQ discomfort 03/01/2017  . Stomach cramps   . Urinary tract infection 11/04/2016    Past Surgical History:  Procedure Laterality Date  . ABDOMINAL HYSTERECTOMY     partial  . APPENDECTOMY    . BREAST LUMPECTOMY Left 2002  . CHOLECYSTECTOMY  Age 13 or 89  . COLONOSCOPY  2017  . EYE SURGERY Left    cataract  . FRACTURE SURGERY    . TONSILLECTOMY      Current Medications: Current Meds  Medication Sig  . aspirin 81 MG tablet Take 81 mg by mouth daily.  . citalopram (CELEXA) 20 MG tablet TAKE 1  TABLET BY MOUTH ONCE DAILY  . famotidine (PEPCID) 20 MG tablet Take 1 tablet (20 mg total) by mouth 2 (two) times daily.  . fluticasone (FLOVENT HFA) 110 MCG/ACT inhaler Inhale 1 puff into the lungs 2 (two) times daily.  . hyoscyamine (LEVSIN SL) 0.125 MG SL tablet Place 1 tablet (0.125 mg total) under the tongue every 4 (four) hours as needed for cramping.  Marland Kitchen levothyroxine (SYNTHROID, LEVOTHROID) 50 MCG tablet Take 1 tablet (50 mcg total) by mouth daily.  Marland Kitchen lisinopril (PRINIVIL,ZESTRIL) 20 MG tablet Take 1 tablet (20 mg total) by mouth daily.  . metoprolol succinate (TOPROL-XL) 25 MG 24 hr tablet Take 1 tablet (25 mg total) by mouth daily.  Marland Kitchen omega-3 acid  ethyl esters (LOVAZA) 1 g capsule Take 2 g by mouth daily.  . polyethylene glycol (MIRALAX) 17 g packet Take 17 g by mouth daily.  . primidone (MYSOLINE) 50 MG tablet Take 1 tablet (50 mg total) by mouth at bedtime.  . Probiotic Product (PROBIOTIC DAILY PO) Take 1 tablet by mouth daily.   . sucralfate (CARAFATE) 1 g tablet Take 1 tablet (1 g total) by mouth every 6 (six) hours as needed.     Allergies:   Patient has no known allergies.   Social History   Socioeconomic History  . Marital status: Single    Spouse name: Not on file  . Number of children: 3  . Years of education: Not on file  . Highest education level: Master's degree (e.g., MA, MS, MEng, MEd, MSW, MBA)  Occupational History  . Occupation: retired    Comment:  Pharmacist, hospital, kindergarten  Social Needs  . Financial resource strain: Not on file  . Food insecurity    Worry: Not on file    Inability: Not on file  . Transportation needs    Medical: Not on file    Non-medical: Not on file  Tobacco Use  . Smoking status: Never Smoker  . Smokeless tobacco: Never Used  Substance and Sexual Activity  . Alcohol use: No    Alcohol/week: 0.0 standard drinks  . Drug use: No  . Sexual activity: Not Currently    Birth control/protection: None    Comment: lives alone, avoids daiiry and gluten. 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 Physical activity    Days per week: Not on file    Minutes per session: Not on file  . Stress: Not on file  Relationships  . Social Herbalist on phone: Not on file    Gets together: Not on file    Attends religious service: Not on file    Active member of club or organization: Not on file    Attends meetings of clubs or organizations: Not on file    Relationship status: Not on file  Other Topics Concern  . Not on file  Social History Narrative  . Not on file     Family History: The patient's family history includes Arthritis in her daughter; Colitis in her mother; Heart attack in her brother, brother, and brother; Heart disease in her daughter; Hyperlipidemia in her brother, brother, brother, and sister; Hypertension in her father; Irritable bowel syndrome in her mother; Leukemia in her daughter; Stomach cancer (age of onset: 40) in her paternal grandmother. There is no history of Colon cancer.  ROS:    Review of Systems  Constitution: Negative for decreased appetite, fever and weight gain.  HENT: Negative for sore throat and tinnitus.   Eyes: Negative for blurred vision and pain.  Cardiovascular: Positive for chest pain and dyspnea on exertion. Negative for leg swelling, palpitations and syncope.  Respiratory: Negative for cough, sleep disturbances due to breathing and sputum production.   Endocrine:  Negative for heat intolerance and polyuria.  Hematologic/Lymphatic: Negative for bleeding problem. Does not bruise/bleed easily.  Skin: Positive for color change. Negative for flushing and poor wound healing.  Musculoskeletal: Negative for back pain, falls and muscle weakness.  Gastrointestinal: Negative for abdominal pain, change in bowel habit and excessive appetite.  Genitourinary: Negative for decreased libido, frequency and incomplete emptying.  Neurological: Positive for light-headedness and numbness (Left arm). Negative for brief paralysis, dizziness, focal weakness, loss of balance and weakness.  Psychiatric/Behavioral: Negative  for depression and suicidal ideas. The patient is not nervous/anxious.   Allergic/Immunologic: Negative for hives and persistent infections.      EKGs/Labs/Other Studies Reviewed:    The following studies were reviewed today:  EKG:  EKG is  ordered today.  The ekg ordered today demonstrates sinus bradycardia, heart rate 58 bpm with nonspecific ST changes.  Transthoracic echocardiogram performed in 2018 ------------------------------------------------------------------- Study Conclusions   - Left ventricle: The cavity size was normal. Wall thickness was   normal. Systolic function was normal. The estimated ejection   fraction was in the range of 55% to 60%. Indeterminant diastolic   function. Wall motion was normal; there were no regional wall   motion abnormalities. - Aortic valve: There was no stenosis. There was trivial   regurgitation. - Mitral valve: There was mild regurgitation. - Left atrium: The atrium was moderately dilated. - Right ventricle: The cavity size was normal. Systolic function   was normal. - Right atrium: The atrium was mildly dilated. - Tricuspid valve: Peak RV-RA gradient (S): 36 mm Hg. - Pulmonary arteries: PA peak pressure: 39 mm Hg (S). - Inferior vena cava: The vessel was normal in size. The   respirophasic diameter  changes were in the normal range (= 50%),   consistent with normal central venous pressure.   Impressions: - Normal LV size with EF 55-60%. Normal RV size and systolic   function. Biatrial enlargement. Mild mitral regurgitation. Mild   pulmonary hypertension.    Recent Labs: 02/01/2019: ALT 14; BUN 22; Creatinine, Ser 1.19; Hemoglobin 11.0; Platelets 221.0; Potassium 4.9; Sodium 135; TSH 1.05  Recent Lipid Panel    Component Value Date/Time   CHOL 238 (H) 02/01/2019 1409   TRIG 188.0 (H) 02/01/2019 1409   HDL 59.40 02/01/2019 1409   CHOLHDL 4 02/01/2019 1409   VLDL 37.6 02/01/2019 1409   LDLCALC 141 (H) 02/01/2019 1409    Physical Exam:    VS:  BP (!) 108/58 (BP Location: Right Arm, Patient Position: Sitting, Cuff Size: Normal)   Pulse (!) 58   Temp 98.2 F (36.8 C)   Ht 4\' 10"  (1.473 m)   Wt 126 lb 12.8 oz (57.5 kg)   SpO2 96%   BMI 26.50 kg/m     Wt Readings from Last 3 Encounters:  02/05/19 126 lb 12.8 oz (57.5 kg)  02/01/19 128 lb (58.1 kg)  01/16/19 126 lb 6.4 oz (57.3 kg)     GEN: Well nourished, well developed in no acute distress HEENT: Normal NECK: No JVD; No carotid bruits LYMPHATICS: No lymphadenopathy CARDIAC: RRR, no murmurs, rubs, gallops RESPIRATORY:  Clear to auscultation without rales, wheezing or rhonchi  ABDOMEN: Soft, non-tender, non-distended EXTREMITIES: No cyanosis, no clubbing, no edema MUSCULOSKELETAL:  No edema; No deformity  SKIN: Warm and dry NEUROLOGIC:  Alert and oriented x 3, nonfocal PSYCHIATRIC:  Normal affect, good insight  ASSESSMENT:    1. Chest pain of uncertain etiology   2. 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 Pre-procedure lab exam   4. Essential hypertension    PLAN:    In order of problems listed above:  Ms. Orta tells me about her chest pain that she has been experiencing for 6 months now is of concern to me due to her risk factors: Hypertension, significant family history for CAD, hyperlipidemia,  age; she is high risk.  Therefore is appropriate to pursue with a left heart cath for her ischemic evaluation.  I do recommend we hold off on surgery until  after her ischemic evaluation.  Risk and benefit for her cardiac catheterization was explained to the patient.  She does not have a contrast allergy and she did undergo left heart cath about 10 years ago.    She is already on aspirin, a moderate dose statin was prescribed for patient today and she was educated on the side effects.  Nitroglycerin not ordered as patient reports low systolic blood pressures as low as 65mmHG at home.  Repeat lipid panel in 2 months.  A transthoracic echocardiogram has been ordered to assess her soft mid ejection systolic murmur.  All of the patient's questions were answered in office.  She is in agreement with the above plan.  She will follow-up in 2 months.    Medication Adjustments/Labs and Tests Ordered: Current medicines are reviewed at length with the patient today.  Concerns regarding medicines are outlined above.  Orders Placed This Encounter  Procedures  . Basic Metabolic Panel (BMET)  . CBC  . Basic Metabolic Panel (BMET)  . Magnesium  . EKG 12-Lead  . ECHOCARDIOGRAM COMPLETE   No orders of the defined types were placed in this encounter.   Patient Instructions  Medication Instructions:  Your physician has recommended you make the following change in your medication:   Start Lipitor 20 mg Take 1 tab daily with evening meal  If you need a refill on your cardiac medications before your next appointment, please call your pharmacy.   Lab work: Your physician recommends that you return for lab work in: TODAY CBC,BMP  [place] 02/06/2019 At 10:25 at [place]   If you have labs (blood work) drawn today and your tests are completely normal, you will receive your results only by: Marland Kitchen MyChart Message (if you have MyChart) OR . A paper copy in the mail If  you have any lab test that is abnormal or we need to change your treatment, we will call you to review the results.  Testing/Procedures:  Your physician has requested that you have an echocardiogram. Echocardiography is a painless test that uses sound waves to create images of your heart. It provides your doctor with information about the size and shape of your heart and how well your heart's chambers and valves are working. This procedure takes approximately one hour. There are no restrictions for this procedure.      [place] [address] [place], [place] [address] Dept: [phone] Loc: [place]  02/05/2019  You are scheduled for a Cardiac Catheterization on Friday, September 18 with Dr. Harrell Gave End.  1. Please arrive at the [facility] (Main Entrance A) at [facility]: [place], [place] [address] at 5:30 AM (This time is two hours before your procedure to ensure your preparation). Free valet parking service is available.   Special note: Every effort is made to have your procedure done on time. Please understand that emergencies sometimes delay scheduled procedures.  2. Diet: Do not eat solid foods after midnight.  The patient may have clear liquids until 5am upon the day of the procedure.  3. Labs: None needed  4. Medication instructions in preparation for your procedure:  On the morning of your procedure, take your Aspirin and any morning medicines NOT listed above.  You may use sips of water.  5. Plan for one night stay--bring personal belongings. 6. Bring a current  list of your medications and current insurance cards. 7. You MUST have a responsible person to drive you home. 8. Someone MUST be with you the first 24 hours after you arrive home or your discharge will be delayed. 9. Please wear clothes that are easy to get on and off and  wear slip-on shoes.  Thank you for allowing [name] to care for you!   -- [place] [facility]  Follow-Up: At [facility], you and your health needs are our priority.  As part of our continuing mission to provide you with exceptional heart care, we have created designated Provider Care Teams.  These Care Teams include your primary Cardiologist (physician) and Advanced Practice Providers (APPs -  Physician Assistants and Nurse Practitioners) who all work together to provide you with the care you need, when you need it. You will need a follow up appointment in 2 months with Dr Harriet Masson. Any Other Special Instructions Will Be Listed Below (If Applicable).       Rolly Pancake, DO  02/05/2019 12:29 PM    [facility]

## 2019-02-05 NOTE — Patient Instructions (Addendum)
Medication Instructions:  Your physician has recommended you make the following change in your medication:   Start Lipitor 20 mg Take 1 tab daily with evening meal  If you need a refill on your cardiac medications before your next appointment, please call your pharmacy.   Lab work: Your physician recommends that you return for lab work in: TODAY CBC,BMP  [place] 02/06/2019 At 10:25 at [place]   If you have labs (blood work) drawn today and your tests are completely normal, you will receive your results only by: Marland Kitchen MyChart Message (if you have MyChart) OR . A paper copy in the mail If you have any lab test that is abnormal or we need to change your treatment, we will call you to review the results.  Testing/Procedures:  Your physician has requested that you have an echocardiogram. Echocardiography is a painless test that uses sound waves to create images of your heart. It provides your doctor with information about the size and shape of your heart and how well your heart's chambers and valves are working. This procedure takes approximately one hour. There are no restrictions for this procedure.      [place] [address] [place], [place] [address] Dept: [phone] Loc: [place]  02/05/2019  You are scheduled for a Cardiac Catheterization on Friday, September 18 with Dr. Harrell Gave End.  1. Please arrive at the [facility] (Main Entrance A) at [facility]: [place], [place] [address] at 5:30 AM (This time is two hours before your procedure to ensure your preparation). Free valet parking service is available.   Special note: Every effort is made to have your procedure done on time. Please understand that emergencies sometimes delay scheduled procedures.  2. Diet: Do not eat solid foods after  midnight.  The patient may have clear liquids until 5am upon the day of the procedure.  3. Labs: None needed  4. Medication instructions in preparation for your procedure:  On the morning of your procedure, take your Aspirin and any morning medicines NOT listed above.  You may use sips of water.  5. Plan for one night stay--bring personal belongings. 6. Bring a current list of your medications and current insurance cards. 7. You MUST have a responsible person to drive you home. 8. Someone MUST be with you the first 24 hours after you arrive home or your discharge will be delayed. 9. Please wear clothes that are easy to get on and off and wear slip-on shoes.  Thank you for allowing [name] to care for you!   -- [place] [facility]  Follow-Up: At [facility], you and your health needs are our priority.  As part of our continuing mission to provide you with exceptional heart care, we have created designated Provider Care Teams.  These Care Teams include your primary Cardiologist (physician) and Advanced Practice Providers (APPs -  Physician Assistants and Nurse Practitioners) who all work together to provide you with the care you need, when you need it. You will need a follow up appointment in 2 months with Dr Harriet Masson. Any Other Special Instructions Will Be Listed Below (If Applicable).

## 2019-02-05 NOTE — H&P (View-Only) (Signed)
Cardiology Office Note:    Date:  02/05/2019   ID:  Julie Wilkerson, DOB [birth year], MRN [id number]  PCP:  [name], MD  Cardiologist:  No primary care provider on file.  Electrophysiologist:  None   Referring MD: [name], MD   "I have been experiencing chest pain"  History of Present Illness:    Julie Wilkerson is a 78 y.o. female with a hx of hypertension and a significant family history of coronary artery disease presents today to be evaluated for preoperative clearance.  The patient does tell me that she has been experiencing chest pain for about 6 months now.  He states that initially when the pain started it was on exertion it was a left-sided dull pain which lasted for about 2 to 3 minutes.  She notes that she would take aspirin and get resolution to the pain. However in the last 2 months the pain is now occurring at rest this last about 10-15 minutes and has not gotten any relief with aspirin in recent weeks.  She tells me is occurring frequently at least about 1 to twice weekly and notes that she is experiencing shortness of breath with radiation to her left arm with this pain now.  She admits to occasional lightheadedness.  She denies any palpitations, nausea, vomiting. She is not experiencing chest pain during our encounter.  Past Medical History:  Diagnosis Date  . Asthma   . Breast cancer ([place])    2002, left, encapsulated, microcalcifications. lumpectomy, radtiation x 30  . Breast cancer in female [facility])   . Change in mole 06/08/2016  . Colitis   . Decreased visual acuity 01/16/2017  . Dyspnea 11/04/2016  . Dysuria 11/04/2016  . Gluten intolerance   . History of chicken pox   . History of Helicobacter pylori infection 08/01/2015  . Hyperlipidemia   . Hypertension   . Hypothyroid   . IBS (irritable bowel syndrome)   . Left leg pain 03/01/2017  . Low back pain 08/10/2015  . Neck pain 03/01/2017  . Osteoporosis   . Personal history of radiation therapy    . RLQ discomfort 03/01/2017  . Stomach cramps   . Urinary tract infection 11/04/2016    Past Surgical History:  Procedure Laterality Date  . ABDOMINAL HYSTERECTOMY     partial  . APPENDECTOMY    . BREAST LUMPECTOMY Left 2002  . CHOLECYSTECTOMY  Age 21 or 62  . COLONOSCOPY  2017  . EYE SURGERY Left    cataract  . FRACTURE SURGERY    . TONSILLECTOMY      Current Medications: Current Meds  Medication Sig  . aspirin 81 MG tablet Take 81 mg by mouth daily.  . citalopram (CELEXA) 20 MG tablet TAKE 1  TABLET BY MOUTH ONCE DAILY  . famotidine (PEPCID) 20 MG tablet Take 1 tablet (20 mg total) by mouth 2 (two) times daily.  . fluticasone (FLOVENT HFA) 110 MCG/ACT inhaler Inhale 1 puff into the lungs 2 (two) times daily.  . hyoscyamine (LEVSIN SL) 0.125 MG SL tablet Place 1 tablet (0.125 mg total) under the tongue every 4 (four) hours as needed for cramping.  Marland Kitchen levothyroxine (SYNTHROID, LEVOTHROID) 50 MCG tablet Take 1 tablet (50 mcg total) by mouth daily.  Marland Kitchen lisinopril (PRINIVIL,ZESTRIL) 20 MG tablet Take 1 tablet (20 mg total) by mouth daily.  . metoprolol succinate (TOPROL-XL) 25 MG 24 hr tablet Take 1 tablet (25 mg total) by mouth daily.  Marland Kitchen omega-3 acid  ethyl esters (LOVAZA) 1 g capsule Take 2 g by mouth daily.  . polyethylene glycol (MIRALAX) 17 g packet Take 17 g by mouth daily.  . primidone (MYSOLINE) 50 MG tablet Take 1 tablet (50 mg total) by mouth at bedtime.  . Probiotic Product (PROBIOTIC DAILY PO) Take 1 tablet by mouth daily.   . sucralfate (CARAFATE) 1 g tablet Take 1 tablet (1 g total) by mouth every 6 (six) hours as needed.     Allergies:   Patient has no known allergies.   Social History   Socioeconomic History  . Marital status: Single    Spouse name: Not on file  . Number of children: 3  . Years of education: Not on file  . Highest education level: Master's degree (e.g., MA, MS, MEng, MEd, MSW, MBA)  Occupational History  . Occupation: retired    Comment:  Pharmacist, hospital, kindergarten  Social Needs  . Financial resource strain: Not on file  . Food insecurity    Worry: Not on file    Inability: Not on file  . Transportation needs    Medical: Not on file    Non-medical: Not on file  Tobacco Use  . Smoking status: Never Smoker  . Smokeless tobacco: Never Used  Substance and Sexual Activity  . Alcohol use: No    Alcohol/week: 0.0 standard drinks  . Drug use: No  . Sexual activity: Not Currently    Birth control/protection: None    Comment: lives alone, avoids daiiry and gluten. 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 Physical activity    Days per week: Not on file    Minutes per session: Not on file  . Stress: Not on file  Relationships  . Social Herbalist on phone: Not on file    Gets together: Not on file    Attends religious service: Not on file    Active member of club or organization: Not on file    Attends meetings of clubs or organizations: Not on file    Relationship status: Not on file  Other Topics Concern  . Not on file  Social History Narrative  . Not on file     Family History: The patient's family history includes Arthritis in her daughter; Colitis in her mother; Heart attack in her brother, brother, and brother; Heart disease in her daughter; Hyperlipidemia in her brother, brother, brother, and sister; Hypertension in her father; Irritable bowel syndrome in her mother; Leukemia in her daughter; Stomach cancer (age of onset: 27) in her paternal grandmother. There is no history of Colon cancer.  ROS:    Review of Systems  Constitution: Negative for decreased appetite, fever and weight gain.  HENT: Negative for sore throat and tinnitus.   Eyes: Negative for blurred vision and pain.  Cardiovascular: Positive for chest pain and dyspnea on exertion. Negative for leg swelling, palpitations and syncope.  Respiratory: Negative for cough, sleep disturbances due to breathing and sputum production.   Endocrine:  Negative for heat intolerance and polyuria.  Hematologic/Lymphatic: Negative for bleeding problem. Does not bruise/bleed easily.  Skin: Positive for color change. Negative for flushing and poor wound healing.  Musculoskeletal: Negative for back pain, falls and muscle weakness.  Gastrointestinal: Negative for abdominal pain, change in bowel habit and excessive appetite.  Genitourinary: Negative for decreased libido, frequency and incomplete emptying.  Neurological: Positive for light-headedness and numbness (Left arm). Negative for brief paralysis, dizziness, focal weakness, loss of balance and weakness.  Psychiatric/Behavioral: Negative  for depression and suicidal ideas. The patient is not nervous/anxious.   Allergic/Immunologic: Negative for hives and persistent infections.      EKGs/Labs/Other Studies Reviewed:    The following studies were reviewed today:  EKG:  EKG is  ordered today.  The ekg ordered today demonstrates sinus bradycardia, heart rate 58 bpm with nonspecific ST changes.  Transthoracic echocardiogram performed in 2018 ------------------------------------------------------------------- Study Conclusions   - Left ventricle: The cavity size was normal. Wall thickness was   normal. Systolic function was normal. The estimated ejection   fraction was in the range of 55% to 60%. Indeterminant diastolic   function. Wall motion was normal; there were no regional wall   motion abnormalities. - Aortic valve: There was no stenosis. There was trivial   regurgitation. - Mitral valve: There was mild regurgitation. - Left atrium: The atrium was moderately dilated. - Right ventricle: The cavity size was normal. Systolic function   was normal. - Right atrium: The atrium was mildly dilated. - Tricuspid valve: Peak RV-RA gradient (S): 36 mm Hg. - Pulmonary arteries: PA peak pressure: 39 mm Hg (S). - Inferior vena cava: The vessel was normal in size. The   respirophasic diameter  changes were in the normal range (= 50%),   consistent with normal central venous pressure.   Impressions: - Normal LV size with EF 55-60%. Normal RV size and systolic   function. Biatrial enlargement. Mild mitral regurgitation. Mild   pulmonary hypertension.    Recent Labs: 02/01/2019: ALT 14; BUN 22; Creatinine, Ser 1.19; Hemoglobin 11.0; Platelets 221.0; Potassium 4.9; Sodium 135; TSH 1.05  Recent Lipid Panel    Component Value Date/Time   CHOL 238 (H) 02/01/2019 1409   TRIG 188.0 (H) 02/01/2019 1409   HDL 59.40 02/01/2019 1409   CHOLHDL 4 02/01/2019 1409   VLDL 37.6 02/01/2019 1409   LDLCALC 141 (H) 02/01/2019 1409    Physical Exam:    VS:  BP (!) 108/58 (BP Location: Right Arm, Patient Position: Sitting, Cuff Size: Normal)   Pulse (!) 58   Temp 98.2 F (36.8 C)   Ht 4\' 10"  (1.473 m)   Wt 126 lb 12.8 oz (57.5 kg)   SpO2 96%   BMI 26.50 kg/m     Wt Readings from Last 3 Encounters:  02/05/19 126 lb 12.8 oz (57.5 kg)  02/01/19 128 lb (58.1 kg)  01/16/19 126 lb 6.4 oz (57.3 kg)     GEN: Well nourished, well developed in no acute distress HEENT: Normal NECK: No JVD; No carotid bruits LYMPHATICS: No lymphadenopathy CARDIAC: RRR, no murmurs, rubs, gallops RESPIRATORY:  Clear to auscultation without rales, wheezing or rhonchi  ABDOMEN: Soft, non-tender, non-distended EXTREMITIES: No cyanosis, no clubbing, no edema MUSCULOSKELETAL:  No edema; No deformity  SKIN: Warm and dry NEUROLOGIC:  Alert and oriented x 3, nonfocal PSYCHIATRIC:  Normal affect, good insight  ASSESSMENT:    1. Chest pain of uncertain etiology   2. 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 Pre-procedure lab exam   4. Essential hypertension    PLAN:    In order of problems listed above:  Ms. Knighten tells me about her chest pain that she has been experiencing for 6 months now is of concern to me due to her risk factors: Hypertension, significant family history for CAD, hyperlipidemia,  age; she is high risk.  Therefore is appropriate to pursue with a left heart cath for her ischemic evaluation.  I do recommend we hold off on surgery until  after her ischemic evaluation.  Risk and benefit for her cardiac catheterization was explained to the patient.  She does not have a contrast allergy and she did undergo left heart cath about 10 years ago.    She is already on aspirin, a moderate dose statin was prescribed for patient today and she was educated on the side effects.  Nitroglycerin not ordered as patient reports low systolic blood pressures as low as 86mmHG at home.  Repeat lipid panel in 2 months.  A transthoracic echocardiogram has been ordered to assess her soft mid ejection systolic murmur.  All of the patient's questions were answered in office.  She is in agreement with the above plan.  She will follow-up in 2 months.    Medication Adjustments/Labs and Tests Ordered: Current medicines are reviewed at length with the patient today.  Concerns regarding medicines are outlined above.  Orders Placed This Encounter  Procedures  . Basic Metabolic Panel (BMET)  . CBC  . Basic Metabolic Panel (BMET)  . Magnesium  . EKG 12-Lead  . ECHOCARDIOGRAM COMPLETE   No orders of the defined types were placed in this encounter.   Patient Instructions  Medication Instructions:  Your physician has recommended you make the following change in your medication:   Start Lipitor 20 mg Take 1 tab daily with evening meal  If you need a refill on your cardiac medications before your next appointment, please call your pharmacy.   Lab work: Your physician recommends that you return for lab work in: TODAY CBC,BMP  [place] 02/06/2019 At 10:25 at [place]   If you have labs (blood work) drawn today and your tests are completely normal, you will receive your results only by: Marland Kitchen MyChart Message (if you have MyChart) OR . A paper copy in the mail If  you have any lab test that is abnormal or we need to change your treatment, we will call you to review the results.  Testing/Procedures:  Your physician has requested that you have an echocardiogram. Echocardiography is a painless test that uses sound waves to create images of your heart. It provides your doctor with information about the size and shape of your heart and how well your heart's chambers and valves are working. This procedure takes approximately one hour. There are no restrictions for this procedure.      [facility] [address], [place] [address] Dept: [phone] Loc: [place]  02/05/2019  You are scheduled for a Cardiac Catheterization on Friday, September 18 with Dr. Harrell Gave End.  1. Please arrive at the [facility] (Main Entrance A) at [facility]: [place], [place] [address] at 5:30 AM (This time is two hours before your procedure to ensure your preparation). Free valet parking service is available.   Special note: Every effort is made to have your procedure done on time. Please understand that emergencies sometimes delay scheduled procedures.  2. Diet: Do not eat solid foods after midnight.  The patient may have clear liquids until 5am upon the day of the procedure.  3. Labs: None needed  4. Medication instructions in preparation for your procedure:  On the morning of your procedure, take your Aspirin and any morning medicines NOT listed above.  You may use sips of water.  5. Plan for one night stay--bring personal belongings. 6. Bring a current  list of your medications and current insurance cards. 7. You MUST have a responsible person to drive you home. 8. Someone MUST be with you the first 24 hours after you arrive home or your discharge will be delayed. 9. Please wear clothes that are easy to get on and off and  wear slip-on shoes.  Thank you for allowing [name] to care for you!   -- [facility]  Follow-Up: At [facility], you and your health needs are our priority.  As part of our continuing mission to provide you with exceptional heart care, we have created designated Provider Care Teams.  These Care Teams include your primary Cardiologist (physician) and Advanced Practice Providers (APPs -  Physician Assistants and Nurse Practitioners) who all work together to provide you with the care you need, when you need it. You will need a follow up appointment in 2 months with Dr Harriet Masson. Any Other Special Instructions Will Be Listed Below (If Applicable).       Rolly Pancake, DO  02/05/2019 12:29 PM    [facility]

## 2019-02-06 ENCOUNTER — Telehealth: Payer: Self-pay | Admitting: *Deleted

## 2019-02-06 ENCOUNTER — Other Ambulatory Visit: Payer: Self-pay | Admitting: *Deleted

## 2019-02-06 ENCOUNTER — Ambulatory Visit (HOSPITAL_COMMUNITY)
Admission: RE | Admit: 2019-02-06 | Discharge: 2019-02-06 | Disposition: A | Discharge status: Home / Self Care | Payer: Medicare HMO | Source: Ambulatory Visit | Attending: Cardiology | Admitting: Cardiology

## 2019-02-06 ENCOUNTER — Inpatient Hospital Stay (HOSPITAL_COMMUNITY): Admission: RE | Admit: 2019-02-06 | Payer: Medicare HMO | Source: Ambulatory Visit

## 2019-02-06 DIAGNOSIS — I351 Nonrheumatic aortic (valve) insufficiency: Secondary | ICD-10-CM | POA: Diagnosis not present

## 2019-02-06 DIAGNOSIS — I5030 Unspecified diastolic (congestive) heart failure: Secondary | ICD-10-CM | POA: Diagnosis not present

## 2019-02-06 DIAGNOSIS — R0789 Other chest pain: Secondary | ICD-10-CM

## 2019-02-06 DIAGNOSIS — R011 Cardiac murmur, unspecified: Secondary | ICD-10-CM | POA: Diagnosis not present

## 2019-02-06 DIAGNOSIS — D649 Anemia, unspecified: Secondary | ICD-10-CM

## 2019-02-06 LAB — BASIC METABOLIC PANEL
BUN/Creatinine Ratio: 22 (ref 12–28)
BUN: 22 mg/dL (ref 8–27)
CO2: 23 mmol/L (ref 20–29)
Calcium: 9.1 mg/dL (ref 8.7–10.3)
Chloride: 103 mmol/L (ref 96–106)
Creatinine, Ser: 1.01 mg/dL — ABNORMAL HIGH (ref 0.57–1.00)
GFR calc Af Amer: 62 mL/min/{1.73_m2} (ref >59)
GFR calc non Af Amer: 54 mL/min/{1.73_m2} — ABNORMAL LOW (ref >59)
Glucose: 128 mg/dL — ABNORMAL HIGH (ref 65–99)
Potassium: 4.5 mmol/L (ref 3.5–5.2)
Sodium: 138 mmol/L (ref 134–144)

## 2019-02-06 LAB — CBC
Hematocrit: 33.7 % — ABNORMAL LOW (ref 34.0–46.6)
Hemoglobin: 11 g/dL — ABNORMAL LOW (ref 11.1–15.9)
MCH: 28.9 pg (ref 26.6–33.0)
MCHC: 32.6 g/dL (ref 31.5–35.7)
MCV: 89 fL (ref 79–97)
Platelets: 227 10*3/uL (ref 150–450)
RBC: 3.81 x10E6/uL (ref 3.77–5.28)
RDW: 14 % (ref 11.7–15.4)
WBC: 4.4 10*3/uL (ref 3.4–10.8)

## 2019-02-06 NOTE — Progress Notes (Signed)
  Echocardiogram 2D Echocardiogram has been performed.  Bobbye Charleston 02/06/2019, 12:19 PM

## 2019-02-06 NOTE — Telephone Encounter (Signed)
Telephone call to patient Informed of lab results

## 2019-02-08 ENCOUNTER — Encounter (HOSPITAL_COMMUNITY): Payer: Self-pay

## 2019-02-08 ENCOUNTER — Telehealth: Payer: Self-pay | Admitting: *Deleted

## 2019-02-08 ENCOUNTER — Other Ambulatory Visit: Payer: Self-pay

## 2019-02-08 ENCOUNTER — Encounter (HOSPITAL_COMMUNITY)
Admission: RE | Admit: 2019-02-08 | Discharge: 2019-02-08 | Disposition: A | Discharge status: Home / Self Care | Payer: Medicare HMO | Source: Ambulatory Visit | Attending: Orthopedic Surgery | Admitting: Orthopedic Surgery

## 2019-02-08 ENCOUNTER — Other Ambulatory Visit (HOSPITAL_COMMUNITY)
Admission: RE | Admit: 2019-02-08 | Discharge: 2019-02-08 | Disposition: A | Discharge status: Home / Self Care | Payer: Medicare HMO | Source: Ambulatory Visit | Attending: Internal Medicine | Admitting: Internal Medicine

## 2019-02-08 DIAGNOSIS — J45909 Unspecified asthma, uncomplicated: Secondary | ICD-10-CM | POA: Insufficient documentation

## 2019-02-08 DIAGNOSIS — I10 Essential (primary) hypertension: Secondary | ICD-10-CM | POA: Diagnosis not present

## 2019-02-08 DIAGNOSIS — Z853 Personal history of malignant neoplasm of breast: Secondary | ICD-10-CM | POA: Diagnosis not present

## 2019-02-08 DIAGNOSIS — F419 Anxiety disorder, unspecified: Secondary | ICD-10-CM | POA: Diagnosis not present

## 2019-02-08 DIAGNOSIS — Z87440 Personal history of urinary (tract) infections: Secondary | ICD-10-CM | POA: Diagnosis not present

## 2019-02-08 DIAGNOSIS — R69 Illness, unspecified: Secondary | ICD-10-CM | POA: Diagnosis not present

## 2019-02-08 DIAGNOSIS — Z79890 Hormone replacement therapy (postmenopausal): Secondary | ICD-10-CM | POA: Insufficient documentation

## 2019-02-08 DIAGNOSIS — F329 Major depressive disorder, single episode, unspecified: Secondary | ICD-10-CM | POA: Insufficient documentation

## 2019-02-08 DIAGNOSIS — Z79899 Other long term (current) drug therapy: Secondary | ICD-10-CM | POA: Diagnosis not present

## 2019-02-08 DIAGNOSIS — Z923 Personal history of irradiation: Secondary | ICD-10-CM | POA: Diagnosis not present

## 2019-02-08 DIAGNOSIS — E039 Hypothyroidism, unspecified: Secondary | ICD-10-CM | POA: Diagnosis not present

## 2019-02-08 DIAGNOSIS — Z01818 Encounter for other preprocedural examination: Secondary | ICD-10-CM | POA: Insufficient documentation

## 2019-02-08 DIAGNOSIS — K589 Irritable bowel syndrome without diarrhea: Secondary | ICD-10-CM | POA: Diagnosis not present

## 2019-02-08 DIAGNOSIS — Z20828 Contact with and (suspected) exposure to other viral communicable diseases: Secondary | ICD-10-CM | POA: Insufficient documentation

## 2019-02-08 DIAGNOSIS — E785 Hyperlipidemia, unspecified: Secondary | ICD-10-CM | POA: Diagnosis not present

## 2019-02-08 DIAGNOSIS — M1612 Unilateral primary osteoarthritis, left hip: Secondary | ICD-10-CM | POA: Diagnosis not present

## 2019-02-08 HISTORY — DX: Nausea with vomiting, unspecified: Z98.890

## 2019-02-08 HISTORY — DX: Other complications of anesthesia, initial encounter: T88.59XA

## 2019-02-08 HISTORY — DX: Nausea with vomiting, unspecified: R11.2

## 2019-02-08 HISTORY — DX: Angina pectoris, unspecified: I20.9

## 2019-02-08 HISTORY — DX: Anemia, unspecified: D64.9

## 2019-02-08 HISTORY — DX: Unspecified osteoarthritis, unspecified site: M19.90

## 2019-02-08 HISTORY — DX: Pneumonia, unspecified organism: J18.9

## 2019-02-08 HISTORY — DX: Anxiety disorder, unspecified: F41.9

## 2019-02-08 LAB — SURGICAL PCR SCREEN
MRSA, PCR: NEGATIVE
Staphylococcus aureus: NEGATIVE

## 2019-02-08 LAB — SARS CORONAVIRUS 2 (TAT 6-24 HRS): SARS Coronavirus 2: NEGATIVE

## 2019-02-08 NOTE — Telephone Encounter (Signed)
Reviewed all instructions with patient who verbalized understanding and is agreeable to plan.   Pt contacted pre-catheterization scheduled at [facility] for: Left heart catheterization with Dr. Saunders Revel on 02/09/2019. Verified arrival time and place: [facility]) at: 5:30 am.    No solid food after midnight prior to cath, clear liquids until 5 AM day of procedure. Contrast allergy: None Verified no diabetes medications.  AM meds can be  taken pre-cath with sip of water including: ASA 81 mg  Patient advised to hold lisinopril today and tomorrow due to GFR of 54.   Confirmed patient has responsible person to drive home post procedure and observe 24 hours after arriving home: yes  Currently, due to Covid-19 pandemic, only one support person will be allowed with patient. Must be the same support person for that patient's entire stay, will be screened and required to wear a mask. They will be asked to wait in the waiting room for the duration of the patient's stay.  Patients are required to wear a mask when they enter the hospital.      COVID-19 Pre-Screening Questions:  . In the past 7 to 10 days have you had a cough,  shortness of breath, headache, congestion, fever (100 or greater) body aches, chills, sore throat, or sudden loss of taste or sense of smell? No . Have you been around anyone with known Covid 19? No . Have you been around anyone who is awaiting Covid 19 test results in the past 7 to 10 days? No . Have you been around anyone who has been exposed to Covid 19, or has mentioned symptoms of Covid 19 within the past 7 to 10 days? No

## 2019-02-08 NOTE — Progress Notes (Signed)
Spoke with Evonnie Pat about pt scheduled cath and covid test needed to be done same day for surgery on 02-13-19 . She asked me to call the drive thru covid department. Called and spoke with Jinny Blossom and she verified with her boss pt would need covid test 02-10-19 prior to surgery on 02-13-19

## 2019-02-08 NOTE — Progress Notes (Signed)
PCP - Gwyneth Revels Cardiologist -  Berniece Salines  PPM/ICD - n/a Device Orders -  Rep Notified  Chest x-ray -  EKG - 06-09-18 epic    CBC AND CMP 02-01-19 EPIC Stress Test -  ECHO - 02-06-19 epic Cardiac Cath -  Scheduled for 02-09-19 epic  Sleep Study -  CPAP -   Fasting Blood Sugar -  Checks Blood Sugar _____ times a day  Blood Thinner Instructions: Aspirin Instructions:stop 81 mg 7 days prior per Dr. Alvan Dame . Was told to take one day before cath and one day of cath   ERAS Protcol - yes PRE-SURGERY Ensure - yes  COVID TEST- 02-10-19   Anesthesia review: cath 02-09-19 for clearance  Patient denies shortness of breath, fever, cough and chest pain at PAT appointment    NONE   Patient verbalized understanding of instructions that were given to them at the PAT appointment. Patient was also instructed that they will need to review over the PAT instructions again at home before surgery.

## 2019-02-09 ENCOUNTER — Encounter (HOSPITAL_COMMUNITY)
Admission: RE | Disposition: A | Discharge status: Home / Self Care | Payer: Medicare HMO | Source: Home / Self Care | Attending: Internal Medicine

## 2019-02-09 ENCOUNTER — Encounter (HOSPITAL_COMMUNITY): Payer: Self-pay | Admitting: Internal Medicine

## 2019-02-09 ENCOUNTER — Ambulatory Visit (HOSPITAL_COMMUNITY)
Admission: RE | Admit: 2019-02-09 | Discharge: 2019-02-09 | Disposition: A | Discharge status: Home / Self Care | Payer: Medicare HMO | Attending: Internal Medicine | Admitting: Internal Medicine

## 2019-02-09 DIAGNOSIS — Z7982 Long term (current) use of aspirin: Secondary | ICD-10-CM | POA: Insufficient documentation

## 2019-02-09 DIAGNOSIS — Z7951 Long term (current) use of inhaled steroids: Secondary | ICD-10-CM | POA: Insufficient documentation

## 2019-02-09 DIAGNOSIS — I729 Aneurysm of unspecified site: Secondary | ICD-10-CM | POA: Diagnosis not present

## 2019-02-09 DIAGNOSIS — Z79890 Hormone replacement therapy (postmenopausal): Secondary | ICD-10-CM | POA: Insufficient documentation

## 2019-02-09 DIAGNOSIS — I200 Unstable angina: Secondary | ICD-10-CM

## 2019-02-09 DIAGNOSIS — K589 Irritable bowel syndrome without diarrhea: Secondary | ICD-10-CM | POA: Insufficient documentation

## 2019-02-09 DIAGNOSIS — I25110 Atherosclerotic heart disease of native coronary artery with unstable angina pectoris: Secondary | ICD-10-CM

## 2019-02-09 DIAGNOSIS — J45909 Unspecified asthma, uncomplicated: Secondary | ICD-10-CM | POA: Insufficient documentation

## 2019-02-09 DIAGNOSIS — E785 Hyperlipidemia, unspecified: Secondary | ICD-10-CM | POA: Diagnosis not present

## 2019-02-09 DIAGNOSIS — M810 Age-related osteoporosis without current pathological fracture: Secondary | ICD-10-CM | POA: Diagnosis not present

## 2019-02-09 DIAGNOSIS — Z90711 Acquired absence of uterus with remaining cervical stump: Secondary | ICD-10-CM | POA: Diagnosis not present

## 2019-02-09 DIAGNOSIS — E039 Hypothyroidism, unspecified: Secondary | ICD-10-CM | POA: Insufficient documentation

## 2019-02-09 DIAGNOSIS — Z8249 Family history of ischemic heart disease and other diseases of the circulatory system: Secondary | ICD-10-CM | POA: Insufficient documentation

## 2019-02-09 DIAGNOSIS — I119 Hypertensive heart disease without heart failure: Secondary | ICD-10-CM | POA: Diagnosis not present

## 2019-02-09 DIAGNOSIS — R079 Chest pain, unspecified: Secondary | ICD-10-CM

## 2019-02-09 DIAGNOSIS — Z79899 Other long term (current) drug therapy: Secondary | ICD-10-CM | POA: Insufficient documentation

## 2019-02-09 HISTORY — DX: Unstable angina: I20.0

## 2019-02-09 HISTORY — PX: LEFT HEART CATH AND CORONARY ANGIOGRAPHY: CATH118249

## 2019-02-09 SURGERY — LEFT HEART CATH AND CORONARY ANGIOGRAPHY
Anesthesia: LOCAL

## 2019-02-09 MED ORDER — HYDRALAZINE HCL 20 MG/ML IJ SOLN: 10.0000 mg | INTRAMUSCULAR | Status: DC | PRN

## 2019-02-09 MED ORDER — HEPARIN (PORCINE) IN NACL 1000-0.9 UT/500ML-% IV SOLN
INTRAVENOUS | Status: AC
  Filled 2019-02-09: qty 1000

## 2019-02-09 MED ORDER — HEPARIN SODIUM (PORCINE) 1000 UNIT/ML IJ SOLN
INTRAMUSCULAR | Status: AC
  Filled 2019-02-09: qty 1

## 2019-02-09 MED ORDER — HEPARIN (PORCINE) IN NACL 1000-0.9 UT/500ML-% IV SOLN
INTRAVENOUS | Status: DC | PRN
  Administered 2019-02-09 (×2): 500 mL

## 2019-02-09 MED ORDER — LABETALOL HCL 5 MG/ML IV SOLN: 10.0000 mg | INTRAVENOUS | Status: DC | PRN

## 2019-02-09 MED ORDER — VERAPAMIL HCL 2.5 MG/ML IV SOLN
INTRAVENOUS | Status: DC | PRN
  Administered 2019-02-09: 10 mL via INTRA_ARTERIAL

## 2019-02-09 MED ORDER — LIDOCAINE HCL (PF) 1 % IJ SOLN
INTRAMUSCULAR | Status: AC
  Filled 2019-02-09: qty 30

## 2019-02-09 MED ORDER — VERAPAMIL HCL 2.5 MG/ML IV SOLN
INTRAVENOUS | Status: AC
  Filled 2019-02-09: qty 2

## 2019-02-09 MED ORDER — FENTANYL CITRATE (PF) 100 MCG/2ML IJ SOLN
INTRAMUSCULAR | Status: DC | PRN
  Administered 2019-02-09: 25 ug via INTRAVENOUS

## 2019-02-09 MED ORDER — SODIUM CHLORIDE 0.9% FLUSH: 3.0000 mL | INTRAVENOUS | Status: DC | PRN

## 2019-02-09 MED ORDER — ASPIRIN 81 MG PO CHEW: 81.0000 mg | CHEWABLE_TABLET | ORAL | Status: DC

## 2019-02-09 MED ORDER — IOHEXOL 350 MG/ML SOLN
INTRAVENOUS | Status: DC | PRN
  Administered 2019-02-09: 40 mL

## 2019-02-09 MED ORDER — SODIUM CHLORIDE 0.9 % WEIGHT BASED INFUSION: 1.0000 mL/kg/h | INTRAVENOUS | Status: DC

## 2019-02-09 MED ORDER — LIDOCAINE HCL (PF) 1 % IJ SOLN
INTRAMUSCULAR | Status: DC | PRN
  Administered 2019-02-09: 2 mL

## 2019-02-09 MED ORDER — SODIUM CHLORIDE 0.9% FLUSH: 3.0000 mL | Freq: Two times a day (BID) | INTRAVENOUS | Status: DC

## 2019-02-09 MED ORDER — SODIUM CHLORIDE 0.9 % IV SOLN: 250.0000 mL | INTRAVENOUS | Status: DC | PRN

## 2019-02-09 MED ORDER — MIDAZOLAM HCL 2 MG/2ML IJ SOLN
INTRAMUSCULAR | Status: AC
  Filled 2019-02-09: qty 2

## 2019-02-09 MED ORDER — ONDANSETRON HCL 4 MG/2ML IJ SOLN: 4.0000 mg | Freq: Four times a day (QID) | INTRAMUSCULAR | Status: DC | PRN

## 2019-02-09 MED ORDER — MIDAZOLAM HCL 2 MG/2ML IJ SOLN
INTRAMUSCULAR | Status: DC | PRN
  Administered 2019-02-09: 1 mg via INTRAVENOUS

## 2019-02-09 MED ORDER — SODIUM CHLORIDE 0.9 % IV SOLN: INTRAVENOUS | Status: DC

## 2019-02-09 MED ORDER — ACETAMINOPHEN 325 MG PO TABS: 650.0000 mg | ORAL_TABLET | ORAL | Status: DC | PRN

## 2019-02-09 MED ORDER — FENTANYL CITRATE (PF) 100 MCG/2ML IJ SOLN
INTRAMUSCULAR | Status: AC
  Filled 2019-02-09: qty 2

## 2019-02-09 MED ORDER — SODIUM CHLORIDE 0.9 % WEIGHT BASED INFUSION
3.0000 mL/kg/h | INTRAVENOUS | Status: AC
  Administered 2019-02-09: 3 mL/kg/h via INTRAVENOUS

## 2019-02-09 MED ORDER — HEPARIN SODIUM (PORCINE) 1000 UNIT/ML IJ SOLN
INTRAMUSCULAR | Status: DC | PRN
  Administered 2019-02-09: 3000 [IU] via INTRAVENOUS

## 2019-02-09 SURGICAL SUPPLY — 9 items
CATH 5FR JL3.5 JR4 ANG PIG MP (CATHETERS) ×2 IMPLANT
DEVICE RAD TR BAND REGULAR (VASCULAR PRODUCTS) ×2 IMPLANT
GLIDESHEATH SLEND SS 6F .021 (SHEATH) ×2 IMPLANT
GUIDEWIRE INQWIRE 1.5J.035X260 (WIRE) ×1 IMPLANT
INQWIRE 1.5J .035X260CM (WIRE) ×2
KIT HEART LEFT (KITS) ×2 IMPLANT
PACK CARDIAC CATHETERIZATION (CUSTOM PROCEDURE TRAY) ×2 IMPLANT
TRANSDUCER W/STOPCOCK (MISCELLANEOUS) ×2 IMPLANT
TUBING CIL FLEX 10 FLL-RA (TUBING) ×2 IMPLANT

## 2019-02-09 NOTE — Progress Notes (Signed)
Discharge instructions reviewed with patient and daughter via telephone. Verbalized understanding.

## 2019-02-09 NOTE — Interval H&P Note (Signed)
History and Physical Interval Note:  02/09/2019 6:54 AM  Julie Wilkerson  has presented today for cardiac catheterization, with the diagnosis of accelerating angina.  The various methods of treatment have been discussed with the patient and family. After consideration of risks, benefits and other options for treatment, the patient has consented to  Procedure(s): LEFT HEART CATH AND CORONARY ANGIOGRAPHY (N/A) as a surgical intervention.  The patient's history has been reviewed, patient examined, no change in status, stable for surgery.  I have reviewed the patient's chart and labs.  Questions were answered to the patient's satisfaction.    Cath Lab Visit (complete for each Cath Lab visit)  Clinical Evaluation Leading to the Procedure:   ACS: No.  Non-ACS:    Anginal Classification: CCS IV  Anti-ischemic medical therapy: Minimal Therapy (1 class of medications)  Non-Invasive Test Results: No non-invasive testing performed  Prior CABG: No previous CABG  Julie Wilkerson

## 2019-02-09 NOTE — Discharge Instructions (Signed)
Radial Site Care ° °This sheet gives you information about how to care for yourself after your procedure. Your health care provider may also give you more specific instructions. If you have problems or questions, contact your health care provider. °What can I expect after the procedure? °After the procedure, it is common to have: °· Bruising and tenderness at the catheter insertion area. °Follow these instructions at home: °Medicines °· Take over-the-counter and prescription medicines only as told by your health care provider. °Insertion site care °· Follow instructions from your health care provider about how to take care of your insertion site. Make sure you: °? Wash your hands with soap and water before you change your bandage (dressing). If soap and water are not available, use hand sanitizer. °? Change your dressing as told by your health care provider. °? Leave stitches (sutures), skin glue, or adhesive strips in place. These skin closures may need to stay in place for 2 weeks or longer. If adhesive strip edges start to loosen and curl up, you may trim the loose edges. Do not remove adhesive strips completely unless your health care provider tells you to do that. °· Check your insertion site every day for signs of infection. Check for: °? Redness, swelling, or pain. °? Fluid or blood. °? Pus or a bad smell. °? Warmth. °· Do not take baths, swim, or use a hot tub until your health care provider approves. °· You may shower 24-48 hours after the procedure, or as directed by your health care provider. °? Remove the dressing and gently wash the site with plain soap and water. °? Pat the area dry with a clean towel. °? Do not rub the site. That could cause bleeding. °· Do not apply powder or lotion to the site. °Activity ° °· For 24 hours after the procedure, or as directed by your health care provider: °? Do not flex or bend the affected arm. °? Do not push or pull heavy objects with the affected arm. °? Do not  drive yourself home from the hospital or clinic. You may drive 24 hours after the procedure unless your health care provider tells you not to. °? Do not operate machinery or power tools. °· Do not lift anything that is heavier than 10 lb (4.5 kg), or the limit that you are told, until your health care provider says that it is safe. °· Ask your health care provider when it is okay to: °? Return to work or school. °? Resume usual physical activities or sports. °? Resume sexual activity. °General instructions °· If the catheter site starts to bleed, raise your arm and put firm pressure on the site. If the bleeding does not stop, get help right away. This is a medical emergency. °· If you went home on the same day as your procedure, a responsible adult should be with you for the first 24 hours after you arrive home. °· Keep all follow-up visits as told by your health care provider. This is important. °Contact a health care provider if: °· You have a fever. °· You have redness, swelling, or yellow drainage around your insertion site. °Get help right away if: °· You have unusual pain at the radial site. °· The catheter insertion area swells very fast. °· The insertion area is bleeding, and the bleeding does not stop when you hold steady pressure on the area. °· Your arm or hand becomes pale, cool, tingly, or numb. °These symptoms may represent a serious problem   that is an emergency. Do not wait to see if the symptoms will go away. Get medical help right away. Call your local emergency services (911 in the [place] Do not drive yourself to the hospital. °Summary °· After the procedure, it is common to have bruising and tenderness at the site. °· Follow instructions from your health care provider about how to take care of your radial site wound. Check the wound every day for signs of infection. °· Do not lift anything that is heavier than 10 lb (4.5 kg), or the limit that you are told, until your health care provider says  that it is safe. °This information is not intended to replace advice given to you by your health care provider. Make sure you discuss any questions you have with your health care provider. °Document Released: 06/12/2010 Document Revised: 06/15/2017 Document Reviewed: 06/15/2017 °Elsevier Patient Education © 2020 [employer] ° °

## 2019-02-10 ENCOUNTER — Other Ambulatory Visit (HOSPITAL_COMMUNITY)
Admission: RE | Admit: 2019-02-10 | Discharge: 2019-02-10 | Disposition: A | Discharge status: Home / Self Care | Payer: Medicare HMO | Source: Ambulatory Visit | Attending: Orthopedic Surgery | Admitting: Orthopedic Surgery

## 2019-02-10 ENCOUNTER — Other Ambulatory Visit (HOSPITAL_COMMUNITY): Payer: Self-pay | Admitting: *Deleted

## 2019-02-10 DIAGNOSIS — Z20828 Contact with and (suspected) exposure to other viral communicable diseases: Secondary | ICD-10-CM | POA: Insufficient documentation

## 2019-02-10 DIAGNOSIS — Z01812 Encounter for preprocedural laboratory examination: Secondary | ICD-10-CM | POA: Insufficient documentation

## 2019-02-11 LAB — NOVEL CORONAVIRUS, NAA (HOSP ORDER, SEND-OUT TO REF LAB; TAT 18-24 HRS): SARS-CoV-2, NAA: NOT DETECTED

## 2019-02-11 NOTE — H&P (Signed)
TOTAL HIP ADMISSION H&P  Patient is admitted for left total hip arthroplasty, anterior approach.  Subjective:  Chief Complaint: Left hip primary OA / pain  HPI: Julie Wilkerson, 78 y.o. female, has a history of pain and functional disability in the left hip(s) due to arthritis and patient has failed non-surgical conservative treatments for greater than 12 weeks to include NSAID's and/or analgesics, corticosteriod injections, use of assistive devices and activity modification.  Onset of symptoms was gradual starting  years ago with gradually worsening course since that time.The patient noted no past surgery on the left hip(s).  Patient currently rates pain in the left hip at 9 out of 10 with activity. Patient has night pain, worsening of pain with activity and weight bearing, trendelenberg gait, pain that interfers with activities of daily living and pain with passive range of motion. Patient has evidence of periarticular osteophytes and joint space narrowing by imaging studies. This condition presents safety issues increasing the risk of falls.  There is no current active infection.  Risks, benefits and expectations were discussed with the patient.  Risks including but not limited to the risk of anesthesia, blood clots, nerve damage, blood vessel damage, failure of the prosthesis, infection and up to and including death.  Patient understand the risks, benefits and expectations and wishes to proceed with surgery.   PCP: [name], MD  D/C Plans:       Home  Post-op Meds:       No Rx given   Tranexamic Acid:      To be given - IV   Decadron:      Is to be given  FYI:      ASA  Norco  DME:   Rx for equipment sent  PT:   HEP     Patient Active Problem List   Diagnosis Date Noted  . Accelerating angina ([place]) 02/09/2019  . Atypical chest pain 02/04/2019  . Bursitis of left hip 09/04/2018  . Pelvic pain 07/02/2018  . Left shoulder pain 05/07/2018  . Left hand pain 05/07/2018  .  Change in bowel habits 02/22/2018  . Nausea and vomiting 02/22/2018  . Poor appetite 02/22/2018  . Anemia 01/17/2018  . Constipation 01/17/2018  . Urinary frequency 12/22/2017  . Right hip pain 10/23/2017  . Elevated sed rate 10/23/2017  . Influenza due to identified influenza virus 09/04/2017  . Bronchitis 04/26/2017  . Acute pansinusitis 04/26/2017  . Memory loss 03/28/2017  . Bilateral carotid bruits 03/28/2017  . Left hip pain 03/01/2017  . Neck pain 03/01/2017  . RLQ discomfort 03/01/2017  . Decreased visual acuity 01/16/2017  . 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 Hyperglycemia 11/07/2016  . 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 Abdominal pain 11/07/2016  . Urinary tract infection 11/04/2016  . Change in mole 06/08/2016  . 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 Vitamin D deficiency 04/29/2016  . Tremor 02/15/2016  . Diarrhea 11/16/2015  . Low back pain 08/10/2015  . History of Helicobacter pylori infection 08/01/2015  . Cough variant asthma  vs UACS from ACEi    . Breast cancer in female [facility])   . Hyperlipidemia   . Essential hypertension   . Osteoporosis   . Hypothyroid   . IBS (irritable bowel syndrome)   . Gluten intolerance   . History of chicken pox    Past Medical History:  Diagnosis Date  . Anemia   . Anginal pain ([place])   .  Anxiety   . Arthritis   . Asthma   . Breast cancer ([place])    2002, left, encapsulated, microcalcifications. lumpectomy, radtiation x 30  . Breast cancer in female [facility])   . Change in mole 06/08/2016  . Colitis   . Complication of anesthesia    VERY SENSITIVE TO ANESTHESIA   . Decreased visual acuity 01/16/2017  . Depression   . Dyspnea 11/04/2016   with asthma exacerbation only  . Dysuria 11/04/2016  . Family history of adverse reaction to anesthesia   . Gluten intolerance   . History of chicken pox   . History of Helicobacter pylori infection 08/01/2015  . Hyperlipidemia   .  Hypertension   . Hypothyroid   . IBS (irritable bowel syndrome)   . Left leg pain 03/01/2017  . Low back pain 08/10/2015  . Neck pain 03/01/2017  . Osteoporosis   . Personal history of radiation therapy   . Pneumonia   . PONV (postoperative nausea and vomiting)   . RLQ discomfort 03/01/2017  . Stomach cramps   . Urinary tract infection 11/04/2016    Past Surgical History:  Procedure Laterality Date  . ABDOMINAL HYSTERECTOMY     partial  . APPENDECTOMY    . BREAST LUMPECTOMY Left 2002  . CHOLECYSTECTOMY  Age 37 or 3  . COLONOSCOPY  2017  . EYE SURGERY Left    cataract  . LEFT HEART CATH AND CORONARY ANGIOGRAPHY N/A 02/09/2019   Procedure: LEFT HEART CATH AND CORONARY ANGIOGRAPHY;  Surgeon: Nelva Bush, MD;  Location: [place] [facility];  Service: Cardiovascular;  Laterality: N/A;  . TONSILLECTOMY      No current facility-administered medications for this encounter.    Current Outpatient Medications  Medication Sig Dispense Refill Last Dose  . citalopram (CELEXA) 20 MG tablet TAKE 1  TABLET BY MOUTH ONCE DAILY (Patient taking differently: Take 20 mg by mouth at bedtime. ) 90 tablet 1   . famotidine (PEPCID) 20 MG tablet Take 1 tablet (20 mg total) by mouth 2 (two) times daily. (Patient taking differently: Take 20 mg by mouth daily. ) 60 tablet 3   . fluticasone (FLOVENT HFA) 110 MCG/ACT inhaler Inhale 1 puff into the lungs 2 (two) times daily. (Patient taking differently: Inhale 1 puff into the lungs 2 (two) times daily as needed (shortness of breath or wheezing). ) 1 Inhaler 12   . hyoscyamine (LEVSIN SL) 0.125 MG SL tablet Place 1 tablet (0.125 mg total) under the tongue every 4 (four) hours as needed for cramping. 30 tablet 1   . levothyroxine (SYNTHROID, LEVOTHROID) 50 MCG tablet Take 1 tablet (50 mcg total) by mouth daily. 90 tablet 1   . lisinopril (PRINIVIL,ZESTRIL) 20 MG tablet Take 1 tablet (20 mg total) by mouth daily. (Patient taking differently: Take 20 mg by mouth at  bedtime. ) 90 tablet 3   . metoprolol succinate (TOPROL-XL) 25 MG 24 hr tablet Take 1 tablet (25 mg total) by mouth daily. (Patient taking differently: Take 25 mg by mouth at bedtime. ) 30 tablet 11   . omega-3 acid ethyl esters (LOVAZA) 1 g capsule Take 2 g by mouth daily.     . polyethylene glycol (MIRALAX) 17 g packet Take 17 g by mouth daily. (Patient taking differently: Take 17 g by mouth daily as needed for moderate constipation. ) 14 each 0   . primidone (MYSOLINE) 50 MG tablet Take 1 tablet (50 mg total) by mouth at bedtime. 90 tablet 1   .  Probiotic Product (PROBIOTIC DAILY PO) Take 1 tablet by mouth daily.      . sucralfate (CARAFATE) 1 g tablet Take 1 tablet (1 g total) by mouth every 6 (six) hours as needed. 60 tablet 2    No Known Allergies  Social History   Tobacco Use  . Smoking status: Never Smoker  . Smokeless tobacco: Never Used  Substance Use Topics  . Alcohol use: Never    Alcohol/week: 0.0 standard drinks    Frequency: Never    Family History  Problem Relation Age of Onset  . Colitis Mother   . Irritable bowel syndrome Mother   . Hypertension Father   . Hyperlipidemia Brother   . Heart attack Brother   . Stomach cancer Paternal Grandmother 77  . Hyperlipidemia Brother   . Heart attack Brother   . Hyperlipidemia Brother   . Heart attack Brother   . Hyperlipidemia Sister   . Leukemia Daughter   . Arthritis Daughter   . Heart disease Daughter        ASD vs VSD  . Colon cancer Neg Hx      Review of Systems  Constitutional: Negative.   HENT: Negative.   Eyes: Negative.   Respiratory: Negative.   Cardiovascular: Negative.   Gastrointestinal: Positive for constipation.  Genitourinary: Positive for frequency.  Musculoskeletal: Positive for joint pain.  Skin: Negative.   Neurological: Negative.   Endo/Heme/Allergies: Negative.   Psychiatric/Behavioral: Positive for depression. The patient is nervous/anxious.     Objective:  Physical Exam   Constitutional: She is oriented to person, place, and time. She appears well-developed.  HENT:  Head: Normocephalic.  Eyes: Pupils are equal, round, and reactive to light.  Neck: Neck supple. No JVD present. No tracheal deviation present. No thyromegaly present.  Cardiovascular: Normal rate, regular rhythm and intact distal pulses.  Respiratory: Effort normal and breath sounds normal. No respiratory distress. She has no wheezes.  GI: Soft. There is no abdominal tenderness. There is no guarding.  Musculoskeletal:     Left hip: She exhibits decreased range of motion, decreased strength, tenderness and bony tenderness. She exhibits no swelling, no deformity and no laceration.  Lymphadenopathy:    She has no cervical adenopathy.  Neurological: She is alert and oriented to person, place, and time. A sensory deficit (bilateral LE neuropathy) is present.  Skin: Skin is warm and dry.  Psychiatric: She has a normal mood and affect.     Labs:   Estimated body mass index is 25.71 kg/m as calculated from the following:   Height as of 02/09/19: 4\' 10"  (1.473 m).   Weight as of 02/09/19: 55.8 kg.   Imaging Review Plain radiographs demonstrate severe degenerative joint disease of the left hip. The bone quality appears to be good for age and reported activity level.      Assessment/Plan:  End stage arthritis, left hip  The patient history, physical examination, clinical judgement of the provider and imaging studies are consistent with end stage degenerative joint disease of the left hip and total hip arthroplasty is deemed medically necessary. The treatment options including medical management, injection therapy, arthroscopy and arthroplasty were discussed at length. The risks and benefits of total hip arthroplasty were presented and reviewed. The risks due to aseptic loosening, infection, stiffness, dislocation/subluxation,  thromboembolic complications and other imponderables were discussed.   The patient acknowledged the explanation, agreed to proceed with the plan and consent was signed. Patient is being admitted for inpatient treatment for surgery, pain control,  PT, OT, prophylactic antibiotics, VTE prophylaxis, progressive ambulation and ADL's and discharge planning.The patient is planning to be discharged home.   [place] Pugh Olamide Carattini   PA-C  02/11/2019, 8:37 PM

## 2019-02-12 MED ORDER — ATORVASTATIN CALCIUM 20 MG PO TABS: 20.0000 mg | ORAL_TABLET | Freq: Every day | ORAL | 3 refills | Status: DC

## 2019-02-12 NOTE — Anesthesia Preprocedure Evaluation (Addendum)
Anesthesia Evaluation  Patient identified by MRN, date of birth, ID band Patient awake    Reviewed: Allergy & Precautions, NPO status , Patient's Chart, lab work & pertinent test results  History of Anesthesia Complications (+) PONV  Airway Mallampati: II  TM Distance: >3 FB Neck ROM: Full    Dental no notable dental hx.    Pulmonary neg pulmonary ROS,    Pulmonary exam normal breath sounds clear to auscultation       Cardiovascular hypertension, Normal cardiovascular exam Rhythm:Regular Rate:Normal     Neuro/Psych negative neurological ROS  negative psych ROS   GI/Hepatic negative GI ROS, Neg liver ROS,   Endo/Other  negative endocrine ROS  Renal/GU negative Renal ROS  negative genitourinary   Musculoskeletal negative musculoskeletal ROS (+)   Abdominal   Peds negative pediatric ROS (+)  Hematology negative hematology ROS (+)   Anesthesia Other Findings   Reproductive/Obstetrics negative OB ROS                            Anesthesia Physical Anesthesia Plan  ASA: II  Anesthesia Plan: Spinal   Post-op Pain Management:    Induction: Intravenous  PONV Risk Score and Plan: 3 and Ondansetron, Dexamethasone, Midazolam and Treatment may vary due to age or medical condition  Airway Management Planned: Simple Face Mask  Additional Equipment:   Intra-op Plan:   Post-operative Plan: Extubation in OR  Informed Consent: I have reviewed the patients History and Physical, chart, labs and discussed the procedure including the risks, benefits and alternatives for the proposed anesthesia with the patient or authorized representative who has indicated his/her understanding and acceptance.     Dental advisory given  Plan Discussed with: CRNA  Anesthesia Plan Comments: (See PAT note 02/08/2019, Konrad Felix, PA-C)       Anesthesia Quick Evaluation

## 2019-02-12 NOTE — Addendum Note (Signed)
Addended by: Particia Nearing B on: 02/12/2019 08:32 AM   Modules accepted: Orders

## 2019-02-12 NOTE — Progress Notes (Signed)
Anesthesia Chart Review   Case: [id number] Date/Time: 02/13/19 1240   Procedure: TOTAL HIP ARTHROPLASTY ANTERIOR APPROACH (Left Hip) - 70 mins   Anesthesia type: Spinal   Pre-op diagnosis: Left hip osteoarthritis   [facility]OM 09 / [facility]   Surgeon: Paralee Cancel, MD      DISCUSSION:78 y.o. never smoker with h/o PONV, HLD, asthma, HTN, breast cancer left 2002, left hip OA scheduled for above procedure 02/13/2019 with Dr. Paralee Cancel.   Pt seen by cardiologist, Dr. Berniece Salines, 02/05/2019 due to 6 months of chest pain. Cardiac cath and Echo ordered.    Echo 02/06/2019 with EF 60-65%, no valvular problems.   Cardiac Cath 02/09/2019 with non obstructive CAD.  Per Dr. Harrell Gave End on results, "I think it is reasonable to proceed with hip arthroplasty next week without additional cardiac testing/intervention."  Anticipate pt can proceed with planned procedure barring acute status change.   VS: BP (!) 149/72   Pulse 61   Temp 36.6 C (Oral)   Resp 18   Ht 4\' 10"  (1.473 m)   Wt 57.6 kg   SpO2 98%   BMI 26.56 kg/m   PROVIDERS: [name], MD is PCP   Berniece Salines, DO is Cardiologist  LABS: Labs reviewed: Acceptable for surgery. (all labs ordered are listed, but only abnormal results are displayed)  Labs Reviewed  SURGICAL PCR SCREEN     IMAGES:   EKG: 02/05/2019 Rate 58 bpm Sinus bradycardia Nonspecific ST ant T wave abnormality   CV: Cardiac Cath 02/09/2019 Conclusions:  Mild to moderate, non-obstructive coronary artery disease, including sequential 30% proximal and mid LAD stenoses, 50% ostial D1 lesion, and 20% ostial/proximal LMCA and mid RCA lesions.  No definite evidence of myocardial bridge on today's study.  Normal left ventricular filling pressure.  LVEF noted to be normal on recent echocardiogram.  Recommendations:  Continue medical therapy and risk factor modification.  Consider addition of statin to prevent progression of disease.  Hold  aspirin in the setting of upcoming orthopedic surgery next week.  Long-term, I recommend aspirin 81 mg daily in the setting of mild to moderate coronary artery disease.  I think it is reasonable to proceed with hip arthroplasty next week without additional cardiac testing/intervention.   Echo 02/06/2019 IMPRESSIONS   1. The left ventricle has normal systolic function with an ejection fraction of 60-65%. The cavity size was normal. Left ventricular diastolic Doppler parameters are consistent with pseudonormalization. Elevated mean left atrial pressure No evidence of  left ventricular regional wall motion abnormalities.  2. The right ventricle has normal systolic function. The cavity was normal. There is no increase in right ventricular wall thickness. Right ventricular systolic pressure is normal.  3. Left atrial size was mildly dilated.  4. No evidence of mitral valve stenosis.  5. The aortic valve is tricuspid. Mild thickening of the aortic valve. Mild calcification of the aortic valve. Aortic valve regurgitation is mild by color flow Doppler. No stenosis of the aortic valve.  6. The aorta is normal unless otherwise noted.  7. The average left ventricular global longitudinal strain is -19.6 %.  Stress Test 01/18/2017   Nuclear stress EF: 66%. No wall motion abnormalities  There was no ST segment deviation noted during stress.  Defect 1: There is a small defect of mild severity present in the apex location. This may very well be attenuation artifact.  This is a low risk study. No high-risk ischemia identified. Past Medical History:  Diagnosis  Date  . Anemia   . Anginal pain ([place])   . Anxiety   . Arthritis   . Asthma   . Breast cancer ([place])    2002, left, encapsulated, microcalcifications. lumpectomy, radtiation x 30  . Breast cancer in female [facility])   . Change in mole 06/08/2016  . Colitis   . Complication of anesthesia    VERY SENSITIVE TO ANESTHESIA   . Decreased visual  acuity 01/16/2017  . Depression   . Dyspnea 11/04/2016   with asthma exacerbation only  . Dysuria 11/04/2016  . Family history of adverse reaction to anesthesia   . Gluten intolerance   . History of chicken pox   . History of Helicobacter pylori infection 08/01/2015  . Hyperlipidemia   . Hypertension   . Hypothyroid   . IBS (irritable bowel syndrome)   . Left leg pain 03/01/2017  . Low back pain 08/10/2015  . Neck pain 03/01/2017  . Osteoporosis   . Personal history of radiation therapy   . Pneumonia   . PONV (postoperative nausea and vomiting)   . RLQ discomfort 03/01/2017  . Stomach cramps   . Urinary tract infection 11/04/2016    Past Surgical History:  Procedure Laterality Date  . ABDOMINAL HYSTERECTOMY     partial  . APPENDECTOMY    . BREAST LUMPECTOMY Left 2002  . CHOLECYSTECTOMY  Age 55 or 36  . COLONOSCOPY  2017  . EYE SURGERY Left    cataract  . LEFT HEART CATH AND CORONARY ANGIOGRAPHY N/A 02/09/2019   Procedure: LEFT HEART CATH AND CORONARY ANGIOGRAPHY;  Surgeon: Nelva Bush, MD;  Location: [place] [facility];  Service: Cardiovascular;  Laterality: N/A;  . TONSILLECTOMY      MEDICATIONS: . atorvastatin (LIPITOR) 20 MG tablet  . citalopram (CELEXA) 20 MG tablet  . famotidine (PEPCID) 20 MG tablet  . fluticasone (FLOVENT HFA) 110 MCG/ACT inhaler  . hyoscyamine (LEVSIN SL) 0.125 MG SL tablet  . levothyroxine (SYNTHROID, LEVOTHROID) 50 MCG tablet  . lisinopril (PRINIVIL,ZESTRIL) 20 MG tablet  . metoprolol succinate (TOPROL-XL) 25 MG 24 hr tablet  . omega-3 acid ethyl esters (LOVAZA) 1 g capsule  . polyethylene glycol (MIRALAX) 17 g packet  . primidone (MYSOLINE) 50 MG tablet  . Probiotic Product (PROBIOTIC DAILY PO)  . sucralfate (CARAFATE) 1 g tablet   No current facility-administered medications for this encounter.     Maia Plan [facility] Pre-Surgical Testing [phone] 02/12/19  11:12 AM

## 2019-02-13 ENCOUNTER — Inpatient Hospital Stay (HOSPITAL_COMMUNITY): Payer: Medicare HMO

## 2019-02-13 ENCOUNTER — Inpatient Hospital Stay (HOSPITAL_COMMUNITY)
Admission: RE | Admit: 2019-02-13 | Discharge: 2019-02-14 | DRG: 470 | Disposition: A | Discharge status: Home / Self Care | Payer: Medicare HMO | Attending: Orthopedic Surgery | Admitting: Orthopedic Surgery

## 2019-02-13 ENCOUNTER — Inpatient Hospital Stay (HOSPITAL_COMMUNITY): Payer: Medicare HMO | Admitting: Physician Assistant

## 2019-02-13 ENCOUNTER — Encounter: Payer: Self-pay | Admitting: Cardiology

## 2019-02-13 ENCOUNTER — Encounter (HOSPITAL_COMMUNITY): Payer: Self-pay | Admitting: *Deleted

## 2019-02-13 ENCOUNTER — Other Ambulatory Visit: Payer: Self-pay

## 2019-02-13 ENCOUNTER — Inpatient Hospital Stay (HOSPITAL_COMMUNITY): Payer: Medicare HMO | Admitting: Certified Registered Nurse Anesthetist

## 2019-02-13 ENCOUNTER — Encounter (HOSPITAL_COMMUNITY)
Admission: RE | Disposition: A | Discharge status: Home / Self Care | Payer: Self-pay | Source: Home / Self Care | Attending: Orthopedic Surgery

## 2019-02-13 DIAGNOSIS — Z90710 Acquired absence of both cervix and uterus: Secondary | ICD-10-CM

## 2019-02-13 DIAGNOSIS — Z923 Personal history of irradiation: Secondary | ICD-10-CM

## 2019-02-13 DIAGNOSIS — Z9889 Other specified postprocedural states: Secondary | ICD-10-CM

## 2019-02-13 DIAGNOSIS — F329 Major depressive disorder, single episode, unspecified: Secondary | ICD-10-CM | POA: Diagnosis present

## 2019-02-13 DIAGNOSIS — Z79890 Hormone replacement therapy (postmenopausal): Secondary | ICD-10-CM | POA: Diagnosis not present

## 2019-02-13 DIAGNOSIS — E785 Hyperlipidemia, unspecified: Secondary | ICD-10-CM | POA: Diagnosis present

## 2019-02-13 DIAGNOSIS — Z79899 Other long term (current) drug therapy: Secondary | ICD-10-CM | POA: Diagnosis not present

## 2019-02-13 DIAGNOSIS — I10 Essential (primary) hypertension: Secondary | ICD-10-CM | POA: Diagnosis present

## 2019-02-13 DIAGNOSIS — Z96642 Presence of left artificial hip joint: Secondary | ICD-10-CM

## 2019-02-13 DIAGNOSIS — Z419 Encounter for procedure for purposes other than remedying health state, unspecified: Secondary | ICD-10-CM

## 2019-02-13 DIAGNOSIS — E039 Hypothyroidism, unspecified: Secondary | ICD-10-CM | POA: Diagnosis not present

## 2019-02-13 DIAGNOSIS — K5900 Constipation, unspecified: Secondary | ICD-10-CM | POA: Diagnosis present

## 2019-02-13 DIAGNOSIS — Z20828 Contact with and (suspected) exposure to other viral communicable diseases: Secondary | ICD-10-CM | POA: Diagnosis not present

## 2019-02-13 DIAGNOSIS — M1612 Unilateral primary osteoarthritis, left hip: Secondary | ICD-10-CM | POA: Diagnosis not present

## 2019-02-13 DIAGNOSIS — M810 Age-related osteoporosis without current pathological fracture: Secondary | ICD-10-CM | POA: Diagnosis present

## 2019-02-13 DIAGNOSIS — Z471 Aftercare following joint replacement surgery: Secondary | ICD-10-CM | POA: Diagnosis not present

## 2019-02-13 DIAGNOSIS — D649 Anemia, unspecified: Secondary | ICD-10-CM | POA: Diagnosis not present

## 2019-02-13 DIAGNOSIS — J45909 Unspecified asthma, uncomplicated: Secondary | ICD-10-CM | POA: Diagnosis not present

## 2019-02-13 DIAGNOSIS — Z96649 Presence of unspecified artificial hip joint: Secondary | ICD-10-CM

## 2019-02-13 DIAGNOSIS — Z853 Personal history of malignant neoplasm of breast: Secondary | ICD-10-CM | POA: Diagnosis not present

## 2019-02-13 DIAGNOSIS — E663 Overweight: Secondary | ICD-10-CM | POA: Diagnosis present

## 2019-02-13 HISTORY — PX: TOTAL HIP ARTHROPLASTY: SHX124

## 2019-02-13 HISTORY — DX: Presence of left artificial hip joint: Z96.642

## 2019-02-13 LAB — TYPE AND SCREEN
ABO/RH(D): A POS
Antibody Screen: NEGATIVE

## 2019-02-13 LAB — ABO/RH: ABO/RH(D): A POS

## 2019-02-13 SURGERY — ARTHROPLASTY, HIP, TOTAL, ANTERIOR APPROACH
Anesthesia: Spinal | Site: Hip | Laterality: Left

## 2019-02-13 MED ORDER — PHENOL 1.4 % MT LIQD: 1.0000 | OROMUCOSAL | Status: DC | PRN

## 2019-02-13 MED ORDER — HYDROCODONE-ACETAMINOPHEN 5-325 MG PO TABS
1.0000 | ORAL_TABLET | ORAL | Status: DC | PRN
  Administered 2019-02-13: 1 via ORAL
  Filled 2019-02-13 (×3): qty 1

## 2019-02-13 MED ORDER — BUDESONIDE 0.25 MG/2ML IN SUSP: 0.2500 mg | Freq: Two times a day (BID) | RESPIRATORY_TRACT | Status: DC | PRN

## 2019-02-13 MED ORDER — MENTHOL 3 MG MT LOZG: 1.0000 | LOZENGE | OROMUCOSAL | Status: DC | PRN

## 2019-02-13 MED ORDER — METHOCARBAMOL 500 MG PO TABS
500.0000 mg | ORAL_TABLET | Freq: Four times a day (QID) | ORAL | Status: DC | PRN
  Administered 2019-02-13 – 2019-02-14 (×2): 500 mg via ORAL
  Filled 2019-02-13 (×2): qty 1

## 2019-02-13 MED ORDER — BUPIVACAINE IN DEXTROSE 0.75-8.25 % IT SOLN
INTRATHECAL | Status: DC | PRN
  Administered 2019-02-13: 1.6 mL via INTRATHECAL

## 2019-02-13 MED ORDER — BISACODYL 10 MG RE SUPP: 10.0000 mg | Freq: Every day | RECTAL | Status: DC | PRN

## 2019-02-13 MED ORDER — METHOCARBAMOL 500 MG IVPB - SIMPLE MED
500.0000 mg | Freq: Four times a day (QID) | INTRAVENOUS | Status: DC | PRN
  Filled 2019-02-13: qty 50

## 2019-02-13 MED ORDER — LEVOTHYROXINE SODIUM 50 MCG PO TABS
50.0000 ug | ORAL_TABLET | Freq: Every day | ORAL | Status: DC
  Administered 2019-02-14: 50 ug via ORAL
  Filled 2019-02-13: qty 1

## 2019-02-13 MED ORDER — TRANEXAMIC ACID-NACL 1000-0.7 MG/100ML-% IV SOLN
1000.0000 mg | INTRAVENOUS | Status: AC
  Administered 2019-02-13: 1000 mg via INTRAVENOUS
  Filled 2019-02-13: qty 100

## 2019-02-13 MED ORDER — ATORVASTATIN CALCIUM 20 MG PO TABS
20.0000 mg | ORAL_TABLET | Freq: Every day | ORAL | Status: DC
  Administered 2019-02-13 – 2019-02-14 (×2): 20 mg via ORAL
  Filled 2019-02-13 (×2): qty 1

## 2019-02-13 MED ORDER — METOPROLOL SUCCINATE ER 25 MG PO TB24
25.0000 mg | ORAL_TABLET | Freq: Every day | ORAL | Status: DC
  Administered 2019-02-13: 25 mg via ORAL
  Filled 2019-02-13: qty 1

## 2019-02-13 MED ORDER — ONDANSETRON HCL 4 MG PO TABS: 4.0000 mg | ORAL_TABLET | Freq: Four times a day (QID) | ORAL | Status: DC | PRN

## 2019-02-13 MED ORDER — FENTANYL CITRATE (PF) 100 MCG/2ML IJ SOLN
INTRAMUSCULAR | Status: DC | PRN
  Administered 2019-02-13: 25 ug via INTRAVENOUS

## 2019-02-13 MED ORDER — CHLORHEXIDINE GLUCONATE 4 % EX LIQD: 60.0000 mL | Freq: Once | CUTANEOUS | Status: DC

## 2019-02-13 MED ORDER — CITALOPRAM HYDROBROMIDE 20 MG PO TABS
20.0000 mg | ORAL_TABLET | Freq: Every day | ORAL | Status: DC
  Administered 2019-02-13: 20 mg via ORAL
  Filled 2019-02-13: qty 1

## 2019-02-13 MED ORDER — ALUM & MAG HYDROXIDE-SIMETH 200-200-20 MG/5ML PO SUSP: 15.0000 mL | ORAL | Status: DC | PRN

## 2019-02-13 MED ORDER — SODIUM CHLORIDE 0.9 % IV SOLN
INTRAVENOUS | Status: DC
  Administered 2019-02-13 – 2019-02-14 (×2): via INTRAVENOUS

## 2019-02-13 MED ORDER — SODIUM CHLORIDE 0.9 % IR SOLN
Status: DC | PRN
  Administered 2019-02-13: 1000 mL

## 2019-02-13 MED ORDER — HYOSCYAMINE SULFATE 0.125 MG SL SUBL
0.1250 mg | SUBLINGUAL_TABLET | SUBLINGUAL | Status: DC | PRN
  Filled 2019-02-13: qty 1

## 2019-02-13 MED ORDER — PROPOFOL 500 MG/50ML IV EMUL
INTRAVENOUS | Status: DC | PRN
  Administered 2019-02-13: 25 ug/kg/min via INTRAVENOUS

## 2019-02-13 MED ORDER — EPHEDRINE SULFATE-NACL 50-0.9 MG/10ML-% IV SOSY
PREFILLED_SYRINGE | INTRAVENOUS | Status: DC | PRN
  Administered 2019-02-13: 10 mg via INTRAVENOUS

## 2019-02-13 MED ORDER — DEXAMETHASONE SODIUM PHOSPHATE 10 MG/ML IJ SOLN
10.0000 mg | Freq: Once | INTRAMUSCULAR | Status: AC
  Administered 2019-02-13: 10 mg via INTRAVENOUS

## 2019-02-13 MED ORDER — MAGNESIUM CITRATE PO SOLN: 1.0000 | Freq: Once | ORAL | Status: DC | PRN

## 2019-02-13 MED ORDER — PROPOFOL 10 MG/ML IV BOLUS
INTRAVENOUS | Status: DC | PRN
  Administered 2019-02-13: 10 mg via INTRAVENOUS

## 2019-02-13 MED ORDER — CELECOXIB 200 MG PO CAPS
200.0000 mg | ORAL_CAPSULE | Freq: Two times a day (BID) | ORAL | Status: DC
  Administered 2019-02-13 – 2019-02-14 (×2): 200 mg via ORAL
  Filled 2019-02-13 (×2): qty 1

## 2019-02-13 MED ORDER — ASPIRIN 81 MG PO CHEW
81.0000 mg | CHEWABLE_TABLET | Freq: Two times a day (BID) | ORAL | Status: DC
  Administered 2019-02-13 – 2019-02-14 (×2): 81 mg via ORAL
  Filled 2019-02-13 (×2): qty 1

## 2019-02-13 MED ORDER — ONDANSETRON HCL 4 MG/2ML IJ SOLN: 4.0000 mg | Freq: Four times a day (QID) | INTRAMUSCULAR | Status: DC | PRN

## 2019-02-13 MED ORDER — DEXAMETHASONE SODIUM PHOSPHATE 10 MG/ML IJ SOLN
INTRAMUSCULAR | Status: AC
  Filled 2019-02-13: qty 1

## 2019-02-13 MED ORDER — HYDROMORPHONE HCL 1 MG/ML IJ SOLN: 0.5000 mg | INTRAMUSCULAR | Status: DC | PRN

## 2019-02-13 MED ORDER — DOCUSATE SODIUM 100 MG PO CAPS
100.0000 mg | ORAL_CAPSULE | Freq: Two times a day (BID) | ORAL | Status: DC
  Administered 2019-02-13 – 2019-02-14 (×2): 100 mg via ORAL
  Filled 2019-02-13 (×2): qty 1

## 2019-02-13 MED ORDER — HYDROCODONE-ACETAMINOPHEN 7.5-325 MG PO TABS
1.0000 | ORAL_TABLET | ORAL | Status: DC | PRN
  Administered 2019-02-13: 1 via ORAL
  Administered 2019-02-13: 2 via ORAL
  Filled 2019-02-13: qty 2
  Filled 2019-02-13: qty 1

## 2019-02-13 MED ORDER — METOCLOPRAMIDE HCL 5 MG PO TABS: 5.0000 mg | ORAL_TABLET | Freq: Three times a day (TID) | ORAL | Status: DC | PRN

## 2019-02-13 MED ORDER — ACETAMINOPHEN 325 MG PO TABS: 325.0000 mg | ORAL_TABLET | Freq: Four times a day (QID) | ORAL | Status: DC | PRN

## 2019-02-13 MED ORDER — POVIDONE-IODINE 10 % EX SWAB
2.0000 "application " | Freq: Once | CUTANEOUS | Status: AC
  Administered 2019-02-13: 2 via TOPICAL

## 2019-02-13 MED ORDER — TRANEXAMIC ACID-NACL 1000-0.7 MG/100ML-% IV SOLN
1000.0000 mg | Freq: Once | INTRAVENOUS | Status: AC
  Administered 2019-02-13: 1000 mg via INTRAVENOUS
  Filled 2019-02-13: qty 100

## 2019-02-13 MED ORDER — FERROUS SULFATE 325 (65 FE) MG PO TABS
325.0000 mg | ORAL_TABLET | Freq: Three times a day (TID) | ORAL | Status: DC
  Administered 2019-02-13 – 2019-02-14 (×3): 325 mg via ORAL
  Filled 2019-02-13 (×3): qty 1

## 2019-02-13 MED ORDER — METOCLOPRAMIDE HCL 5 MG/ML IJ SOLN: 5.0000 mg | Freq: Three times a day (TID) | INTRAMUSCULAR | Status: DC | PRN

## 2019-02-13 MED ORDER — FENTANYL CITRATE (PF) 100 MCG/2ML IJ SOLN
INTRAMUSCULAR | Status: AC
  Filled 2019-02-13: qty 2

## 2019-02-13 MED ORDER — FAMOTIDINE 20 MG PO TABS
20.0000 mg | ORAL_TABLET | Freq: Every day | ORAL | Status: DC
  Administered 2019-02-14: 20 mg via ORAL
  Filled 2019-02-13: qty 1

## 2019-02-13 MED ORDER — CEFAZOLIN SODIUM-DEXTROSE 2-4 GM/100ML-% IV SOLN
2.0000 g | INTRAVENOUS | Status: AC
  Administered 2019-02-13: 2 g via INTRAVENOUS
  Filled 2019-02-13: qty 100

## 2019-02-13 MED ORDER — PROPOFOL 10 MG/ML IV BOLUS
INTRAVENOUS | Status: AC
  Filled 2019-02-13: qty 40

## 2019-02-13 MED ORDER — DIPHENHYDRAMINE HCL 12.5 MG/5ML PO ELIX: 12.5000 mg | ORAL_SOLUTION | ORAL | Status: DC | PRN

## 2019-02-13 MED ORDER — POLYETHYLENE GLYCOL 3350 17 G PO PACK
17.0000 g | PACK | Freq: Two times a day (BID) | ORAL | Status: DC
  Administered 2019-02-14: 17 g via ORAL
  Filled 2019-02-13 (×2): qty 1

## 2019-02-13 MED ORDER — LACTATED RINGERS IV SOLN
INTRAVENOUS | Status: DC
  Administered 2019-02-13 (×2): via INTRAVENOUS

## 2019-02-13 MED ORDER — ONDANSETRON HCL 4 MG/2ML IJ SOLN
INTRAMUSCULAR | Status: AC
  Filled 2019-02-13: qty 2

## 2019-02-13 MED ORDER — ONDANSETRON HCL 4 MG/2ML IJ SOLN
INTRAMUSCULAR | Status: DC | PRN
  Administered 2019-02-13: 4 mg via INTRAVENOUS

## 2019-02-13 MED ORDER — PRIMIDONE 50 MG PO TABS
50.0000 mg | ORAL_TABLET | Freq: Every day | ORAL | Status: DC
  Administered 2019-02-13: 50 mg via ORAL
  Filled 2019-02-13 (×2): qty 1

## 2019-02-13 MED ORDER — DEXAMETHASONE SODIUM PHOSPHATE 10 MG/ML IJ SOLN
10.0000 mg | Freq: Once | INTRAMUSCULAR | Status: AC
  Administered 2019-02-14: 10 mg via INTRAVENOUS
  Filled 2019-02-13: qty 1

## 2019-02-13 MED ORDER — CEFAZOLIN SODIUM-DEXTROSE 2-4 GM/100ML-% IV SOLN
2.0000 g | Freq: Four times a day (QID) | INTRAVENOUS | Status: AC
  Administered 2019-02-13 (×2): 2 g via INTRAVENOUS
  Filled 2019-02-13 (×2): qty 100

## 2019-02-13 MED ORDER — EPHEDRINE 5 MG/ML INJ
INTRAVENOUS | Status: AC
  Filled 2019-02-13: qty 20

## 2019-02-13 SURGICAL SUPPLY — 47 items
BAG DECANTER FOR FLEXI CONT (MISCELLANEOUS) IMPLANT
BAG ZIPLOCK 12X15 (MISCELLANEOUS) IMPLANT
BLADE SAG 18X100X1.27 (BLADE) ×2 IMPLANT
BLADE SURG SZ10 CARB STEEL (BLADE) ×4 IMPLANT
COVER PERINEAL POST (MISCELLANEOUS) ×2 IMPLANT
COVER SURGICAL LIGHT HANDLE (MISCELLANEOUS) ×2 IMPLANT
COVER WAND RF STERILE (DRAPES) IMPLANT
CUP ACETBLR 48 OD SECTOR II (Hips) ×2 IMPLANT
DERMABOND ADVANCED (GAUZE/BANDAGES/DRESSINGS) ×1
DERMABOND ADVANCED .7 DNX12 (GAUZE/BANDAGES/DRESSINGS) ×1 IMPLANT
DRAPE STERI IOBAN 125X83 (DRAPES) ×2 IMPLANT
DRAPE U-SHAPE 47X51 STRL (DRAPES) ×4 IMPLANT
DRESSING AQUACEL AG SP 3.5X10 (GAUZE/BANDAGES/DRESSINGS) ×1 IMPLANT
DRSG AQUACEL AG SP 3.5X10 (GAUZE/BANDAGES/DRESSINGS) ×2
DURAPREP 26ML APPLICATOR (WOUND CARE) ×2 IMPLANT
ELECT BLADE TIP CTD 4 INCH (ELECTRODE) ×2 IMPLANT
ELECT REM PT RETURN 15FT ADLT (MISCELLANEOUS) ×2 IMPLANT
ELIMINATOR HOLE APEX DEPUY (Hips) ×2 IMPLANT
FEM STEM 12/14 TAPER SZ 4 HIP (Orthopedic Implant) ×2 IMPLANT
FEMORAL STEM 12/14 TPR SZ4 HIP (Orthopedic Implant) ×1 IMPLANT
GLOVE BIO SURGEON STRL SZ 6 (GLOVE) ×4 IMPLANT
GLOVE BIOGEL PI IND STRL 6.5 (GLOVE) ×1 IMPLANT
GLOVE BIOGEL PI IND STRL 7.5 (GLOVE) ×1 IMPLANT
GLOVE BIOGEL PI IND STRL 8.5 (GLOVE) ×1 IMPLANT
GLOVE BIOGEL PI INDICATOR 6.5 (GLOVE) ×1
GLOVE BIOGEL PI INDICATOR 7.5 (GLOVE) ×1
GLOVE BIOGEL PI INDICATOR 8.5 (GLOVE) ×1
GLOVE ECLIPSE 8.0 STRL XLNG CF (GLOVE) ×4 IMPLANT
GLOVE ORTHO TXT STRL SZ7.5 (GLOVE) ×4 IMPLANT
GOWN STRL REUS W/TWL LRG LVL3 (GOWN DISPOSABLE) ×4 IMPLANT
GOWN STRL REUS W/TWL XL LVL3 (GOWN DISPOSABLE) ×2 IMPLANT
HEAD FEM STD 32X+1 STRL (Hips) ×2 IMPLANT
HOLDER FOLEY CATH W/STRAP (MISCELLANEOUS) ×2 IMPLANT
KIT TURNOVER KIT A (KITS) IMPLANT
PACK ANTERIOR HIP CUSTOM (KITS) ×2 IMPLANT
PINN ALTRX NEUT ID X OD 32X48 ×2 IMPLANT
SCREW PINN CAN 6.5X20 (Screw) ×2 IMPLANT
SUT MNCRL AB 4-0 PS2 18 (SUTURE) ×2 IMPLANT
SUT STRATAFIX 0 PDS 27 VIOLET (SUTURE) ×2
SUT VIC AB 1 CT1 36 (SUTURE) ×6 IMPLANT
SUT VIC AB 2-0 CT1 27 (SUTURE) ×2
SUT VIC AB 2-0 CT1 TAPERPNT 27 (SUTURE) ×2 IMPLANT
SUTURE STRATFX 0 PDS 27 VIOLET (SUTURE) ×1 IMPLANT
TRAY FOLEY MTR SLVR 14FR STAT (SET/KITS/TRAYS/PACK) ×2 IMPLANT
TRAY FOLEY MTR SLVR 16FR STAT (SET/KITS/TRAYS/PACK) IMPLANT
WATER STERILE IRR 1000ML POUR (IV SOLUTION) ×4 IMPLANT
YANKAUER SUCT BULB TIP 10FT TU (MISCELLANEOUS) IMPLANT

## 2019-02-13 NOTE — Anesthesia Procedure Notes (Signed)
Spinal  Patient location during procedure: OR Staffing Anesthesiologist: Siddh Vandeventer, MD Performed: anesthesiologist  Preanesthetic Checklist Completed: patient identified, site marked, surgical consent, pre-op evaluation, timeout performed, IV checked, risks and benefits discussed and monitors and equipment checked Spinal Block Patient position: sitting Prep: DuraPrep Patient monitoring: heart rate, continuous pulse ox and blood pressure Approach: right paramedian Location: L3-4 Injection technique: single-shot Needle Needle type: Sprotte  Needle gauge: 24 G Needle length: 9 cm Additional Notes Expiration date of kit checked and confirmed. Patient tolerated procedure well, without complications.       

## 2019-02-13 NOTE — Evaluation (Signed)
Physical Therapy Evaluation Patient Details Name: Julie Wilkerson MRN: [id number] DOB: 12/16/1940 Today's Date: 02/13/2019   History of Present Illness  Pt is 78 y.o. female s/p Lt THA ant approach on 02/13/19. She has PMH significant for HTN ,HLD, neck pain, back pain, depression, arthritis, and history of breast cancer.    Clinical Impression  Julie Wilkerson is a 78 y.o. female POD 0 s/p Lt THA ant approach. Patient reports modified independence with mobility using  SPC at baseline. Patient is now limited by functional impairments (see PT problem list below) and requires min assist for transfers and gait with RW. Patient was able to ambulate ~40 feet with RW and minimal cues for safe use of RW. Patient instructed in exercise to facilitate ROM and circulation to manage edema. Patient will benefit from continued skilled PT interventions to address impairments and progress towards PLOF. Acute PT will follow to progress mobility and stair training in preparation for safe discharge home.     Follow Up Recommendations Follow surgeon's recommendation for DC plan and follow-up therapies    Equipment Recommendations  None recommended by PT    Recommendations for Other Services       Precautions / Restrictions Precautions Precautions: Fall Restrictions Weight Bearing Restrictions: No      Mobility  Bed Mobility Overal bed mobility: Needs Assistance Bed Mobility: Supine to Sit     Supine to sit: Min assist     General bed mobility comments: min assist for Lt LE management and cues for seqeuncing to scoot to EOB, min assist to raise trunk  Transfers Overall transfer level: Needs assistance Equipment used: Rolling walker (2 wheeled) Transfers: Sit to/from Stand Sit to Stand: Min assist         General transfer comment: cues for safe hand placement and assist to initiate power up, pt hesitant to WB on Lt LE at first but able to with  encouragement  Ambulation/Gait Ambulation/Gait assistance: Min assist;Min guard Gait Distance (Feet): 40 Feet Assistive device: Rolling walker (2 wheeled) Gait Pattern/deviations: Step-to pattern;Decreased step length - right;Decreased step length - left;Decreased stride length;Decreased stance time - left;Decreased weight shift to left Gait velocity: decreased   General Gait Details: pt with small steps and required cues to maintain close proximety to [employer]    Modified Rankin (Stroke Patients Only)       Balance Overall balance assessment: Needs assistance Sitting-balance support: Bilateral upper extremity supported;Feet supported Sitting balance-Leahy Scale: Fair     Standing balance support: During functional activity;Bilateral upper extremity supported Standing balance-Leahy Scale: Fair Standing balance comment: pt requries UE support for dynamic activity          Pertinent Vitals/Pain Pain Assessment: Faces Faces Pain Scale: Hurts a little bit Pain Location: Lt hip Pain Descriptors / Indicators: Grimacing;Guarding Pain Intervention(s): Limited activity within patient's tolerance;Monitored during session    [place] expects to be discharged to:: Private residence Living Arrangements: Alone Available Help at Discharge: Family(pt's daughter) Type of Home: House Home Access: Stairs to enter Entrance Stairs-Rails: Right;None Technical brewer of Steps: pt's home: threshold at front door, no hand rail; daughter's home: 3 steps with Rt handrail going up Home Layout: One level;Two level;Able to live on main level with bedroom/bathroom Home Equipment: Gilford Rile - 2 wheels;Shower seat - built in;Cane - single point Additional Comments: pt is going to stay at her daughter's home for 1 week followin  surgery    Prior Function Level of Independence: Independent with assistive device(s)         Comments: pt  was using SPC prior to surgery     Hand Dominance        Extremity/Trunk Assessment   Upper Extremity Assessment Upper Extremity Assessment: Overall WFL for tasks assessed    Lower Extremity Assessment Lower Extremity Assessment: Generalized weakness    Cervical / Trunk Assessment Cervical / Trunk Assessment: Normal  Communication   Communication: No difficulties  Cognition Arousal/Alertness: Awake/alert Behavior During Therapy: WFL for tasks assessed/performed Overall Cognitive Status: Within Functional Limits for tasks assessed           General Comments      Exercises Total Joint Exercises Ankle Circles/Pumps: AROM;10 reps;Seated;Both Quad Sets: AROM;5 reps;Supine;Seated;Left(2 sets (1x 5 reps supine, 1x5 reps seated)) Heel Slides: AROM;5 reps;Seated;Left   Assessment/Plan    PT Assessment Patient needs continued PT services  PT Problem List Decreased strength;Decreased balance;Decreased range of motion;Decreased mobility;Decreased activity tolerance;Decreased knowledge of use of DME;Decreased knowledge of precautions       PT Treatment Interventions DME instruction;Functional mobility training;Patient/family education;Balance training;Gait training;Modalities;Therapeutic activities;Therapeutic exercise;Stair training    PT Goals (Current goals can be found in the Care Plan section)  Acute Rehab PT Goals Patient Stated Goal: to return home PT Goal Formulation: With patient Time For Goal Achievement: 02/20/19 Potential to Achieve Goals: Good    Frequency 7X/week    AM-PAC PT "6 Clicks" Mobility  Outcome Measure Help needed turning from your back to your side while in a flat bed without using bedrails?: A Little Help needed moving from lying on your back to sitting on the side of a flat bed without using bedrails?: A Little Help needed moving to and from a bed to a chair (including a wheelchair)?: A Little Help needed standing up from a chair using your  arms (e.g., wheelchair or bedside chair)?: A Little Help needed to walk in hospital room?: A Little Help needed climbing 3-5 steps with a railing? : A Little 6 Click Score: 18    End of Session Equipment Utilized During Treatment: Gait belt Activity Tolerance: Patient tolerated treatment well Patient left: with call bell/phone within reach;in chair;with chair alarm set;with family/visitor present Nurse Communication: Mobility status PT Visit Diagnosis: Other abnormalities of gait and mobility (R26.89);Muscle weakness (generalized) (M62.81);Difficulty in walking, not elsewhere classified (R26.2);Unsteadiness on feet (R26.81)    Time: [id number] PT Time Calculation (min) (ACUTE ONLY): 26 min   Charges:   PT Evaluation $PT Eval Low Complexity: 1 Low PT Treatments $Therapeutic Exercise: 8-22 mins       Kipp Brood, PT, DPT, [facility] Physical Therapist with [facility]  02/13/2019 7:24 PM

## 2019-02-13 NOTE — Anesthesia Procedure Notes (Signed)
Procedure Name: MAC Date/Time: 02/13/2019 12:03 PM Performed by: West Pugh, CRNA Pre-anesthesia Checklist: Patient identified, Emergency Drugs available, Suction available and Patient being monitored Patient Re-evaluated:Patient Re-evaluated prior to induction Oxygen Delivery Method: Simple face mask Preoxygenation: Pre-oxygenation with 100% oxygen Induction Type: IV induction Number of attempts: 1 Placement Confirmation: positive ETCO2 Dental Injury: Teeth and Oropharynx as per pre-operative assessment

## 2019-02-13 NOTE — Transfer of Care (Signed)
Immediate Anesthesia Transfer of Care Note  Patient: Julie Wilkerson  Procedure(s) Performed: TOTAL HIP ARTHROPLASTY ANTERIOR APPROACH (Left Hip)  Patient Location: PACU  Anesthesia Type:MAC and Spinal  Level of Consciousness: awake, alert , oriented and patient cooperative  Airway & Oxygen Therapy: Patient Spontanous Breathing and Patient connected to face mask oxygen  Post-op Assessment: Report given to RN and Post -op Vital signs reviewed and stable  Post vital signs: Reviewed and stable  Last Vitals:  Vitals Value Taken Time  BP 119/57 02/13/19 1348  Temp    Pulse 65 02/13/19 1351  Resp 12 02/13/19 1351  SpO2 94 % 02/13/19 1351  Vitals shown include unvalidated device data.  Last Pain: There were no vitals filed for this visit.    Patients Stated Pain Goal: 4 ([birth year] 8887)  Complications: No apparent anesthesia complications

## 2019-02-13 NOTE — Anesthesia Postprocedure Evaluation (Signed)
Anesthesia Post Note  Patient: Julie Wilkerson  Procedure(s) Performed: TOTAL HIP ARTHROPLASTY ANTERIOR APPROACH (Left Hip)     Patient location during evaluation: PACU Anesthesia Type: Spinal Level of consciousness: awake and alert Pain management: pain level controlled Vital Signs Assessment: post-procedure vital signs reviewed and stable Respiratory status: spontaneous breathing and respiratory function stable Cardiovascular status: blood pressure returned to baseline and stable Postop Assessment: no headache, no backache, spinal receding and no apparent nausea or vomiting Anesthetic complications: no    Last Vitals:  Vitals:   02/13/19 1445 02/13/19 1508  BP: (!) 129/48 (!) 137/58  Pulse: 67 67  Resp: 13 16  Temp: 36.4 C 36.5 C  SpO2: 100% 100%    Last Pain:  Vitals:   02/13/19 1508  TempSrc: Oral  PainSc:                  [name]

## 2019-02-13 NOTE — Interval H&P Note (Signed)
History and Physical Interval Note:  02/13/2019 10:57 AM  Julie Wilkerson  has presented today for surgery, with the diagnosis of Left hip osteoarthritis.  The various methods of treatment have been discussed with the patient and family. After consideration of risks, benefits and other options for treatment, the patient has consented to  Procedure(s) with comments: TOTAL HIP ARTHROPLASTY ANTERIOR APPROACH (Left) - 70 mins as a surgical intervention.  The patient's history has been reviewed, patient examined, no change in status, stable for surgery.  I have reviewed the patient's chart and labs.  Questions were answered to the patient's satisfaction.     Mauri Pole

## 2019-02-13 NOTE — Plan of Care (Signed)
  Problem: Education: Goal: Knowledge of the prescribed therapeutic regimen will improve Outcome: Progressing Goal: Understanding of discharge needs will improve Outcome: Progressing Goal: Individualized Educational Video(s) Outcome: Progressing   Problem: Activity: Goal: Ability to avoid complications of mobility impairment will improve Outcome: Progressing Goal: Ability to tolerate increased activity will improve Outcome: Progressing   

## 2019-02-13 NOTE — Op Note (Signed)
NAME:  Julie Wilkerson                ACCOUNT NO.: [id number]      MEDICAL RECORD NO.: [id number]      FACILITY:  [facility]      PHYSICIAN:  Mauri Pole  DATE OF BIRTH:  1940-09-06     DATE OF PROCEDURE:  02/13/2019                                 OPERATIVE REPORT         PREOPERATIVE DIAGNOSIS: Left  hip osteoarthritis.      POSTOPERATIVE DIAGNOSIS:  Left hip osteoarthritis.      PROCEDURE:  Left total hip replacement through an anterior approach   utilizing DePuy THR system, component size 31mm pinnacle cup, a size 32+4 neutral   Altrex liner, a size 4 Hi Actis stem with a 32+1 Articuleze metal ball.      SURGEON:  Pietro Cassis. Alvan Dame, M.D.      ASSISTANT:  Danae Orleans, PA-C     ANESTHESIA:  Spinal.      SPECIMENS:  None.      COMPLICATIONS:  None.      BLOOD LOSS:  200 cc     DRAINS:  None.      INDICATION OF THE PROCEDURE:  Julie Wilkerson is a 78 y.o. female who had   presented to office for evaluation of left hip pain.  Radiographs revealed   progressive degenerative changes with bone-on-bone   articulation of the  hip joint, including subchondral cystic changes and osteophytes.  The patient had painful limited range of   motion significantly affecting their overall quality of life and function.  The patient was failing to    respond to conservative measures including medications and/or injections and activity modification and at this point was ready   to proceed with more definitive measures.  Consent was obtained for   benefit of pain relief.  Specific risks of infection, DVT, component   failure, dislocation, neurovascular injury, and need for revision surgery were reviewed in the office as well discussion of   the anterior versus posterior approach were reviewed.     PROCEDURE IN DETAIL:  The patient was brought to operative theater.   Once adequate anesthesia, preoperative antibiotics, 2 gm of Ancef, 1 gm of Tranexamic Acid, and 10  mg of Decadron were administered, the patient was positioned supine on the [employer] table.  Once the patient was safely positioned with adequate padding of boney prominences we predraped out the hip, and used fluoroscopy to confirm orientation of the pelvis.      The left hip was then prepped and draped from proximal iliac crest to   mid thigh with a shower curtain technique.      Time-out was performed identifying the patient, planned procedure, and the appropriate extremity.     An incision was then made 2 cm lateral to the   anterior superior iliac spine extending over the orientation of the   tensor fascia lata muscle and sharp dissection was carried down to the   fascia of the muscle.      The fascia was then incised.  The muscle belly was identified and swept   laterally and retractor placed along the superior neck.  Following   cauterization of the circumflex vessels and removing some pericapsular  fat, a second cobra retractor was placed on the inferior neck.  A T-capsulotomy was made along the line of the   superior neck to the trochanteric fossa, then extended proximally and   distally.  Tag sutures were placed and the retractors were then placed   intracapsular.  We then identified the trochanteric fossa and   orientation of my neck cut and then made a neck osteotomy with the femur on traction.  The femoral   head was removed without difficulty or complication.  Traction was let   off and retractors were placed posterior and anterior around the   acetabulum.      The labrum and foveal tissue were debrided.  I began reaming with a 43 mm   reamer and reamed up to 47 mm reamer with good bony bed preparation and a 48 mm  cup was chosen.  The final 48 mm Pinnacle cup was then impacted under fluoroscopy to confirm the depth of penetration and orientation with respect to   Abduction and forward flexion.  A screw was placed into the ilium followed by the hole eliminator.  The final    32+4 neutral Altrex liner was impacted with good visualized rim fit.  The cup was positioned anatomically within the acetabular portion of the pelvis.      At this point, the femur was rolled to 100 degrees.  Further capsule was   released off the inferior aspect of the femoral neck.  I then   released the superior capsule proximally.  With the leg in a neutral position the hook was placed laterally   along the femur under the vastus lateralis origin and elevated manually and then held in position using the hook attachment on the bed.  The leg was then extended and adducted with the leg rolled to 100   degrees of external rotation.  Retractors were placed along the medial calcar and posteriorly over the greater trochanter.  Once the proximal femur was fully   exposed, I used a box osteotome to set orientation.  I then began   broaching with the starting chili pepper broach and passed this by hand and then broached up to 4.  With the 4 broach in place I chose a high offset neck and did several trial reductions.  The offset was appropriate, leg lengths   appeared to be equal best matched with the +1 head ball trial confirmed radiographically.   Given these findings, I went ahead and dislocated the hip, repositioned all   retractors and positioned the right hip in the extended and abducted position.  The final 4 Hi Actis stem was   chosen and it was impacted down to the level of neck cut.  Based on this   and the trial reductions, a final 32+1 Articuleze metal head ball was chosen and   impacted onto a clean and dry trunnion, and the hip was reduced.  The   hip had been irrigated throughout the case again at this point.  I did   reapproximate the superior capsular leaflet to the anterior leaflet   using #1 Vicryl.  The fascia of the   tensor fascia lata muscle was then reapproximated using #1 Vicryl and #0 Stratafix sutures.  The   remaining wound was closed with 2-0 Vicryl and running 4-0 Monocryl.    The hip was cleaned, dried, and dressed sterilely using Dermabond and   Aquacel dressing.  The patient was then brought   to recovery room  in stable condition tolerating the procedure well.    Danae Orleans, PA-C was present for the entirety of the case involved from   preoperative positioning, perioperative retractor management, general   facilitation of the case, as well as primary wound closure as assistant.            Pietro Cassis Alvan Dame, M.D.        02/13/2019 1:22 PM

## 2019-02-14 ENCOUNTER — Encounter (HOSPITAL_COMMUNITY): Payer: Self-pay | Admitting: Orthopedic Surgery

## 2019-02-14 DIAGNOSIS — E663 Overweight: Secondary | ICD-10-CM

## 2019-02-14 HISTORY — DX: Overweight: E66.3

## 2019-02-14 LAB — CBC
HCT: 28.5 % — ABNORMAL LOW (ref 36.0–46.0)
Hemoglobin: 9.1 g/dL — ABNORMAL LOW (ref 12.0–15.0)
MCH: 28.8 pg (ref 26.0–34.0)
MCHC: 31.9 g/dL (ref 30.0–36.0)
MCV: 90.2 fL (ref 80.0–100.0)
Platelets: 180 10*3/uL (ref 150–400)
RBC: 3.16 MIL/uL — ABNORMAL LOW (ref 3.87–5.11)
RDW: 14.1 % (ref 11.5–15.5)
WBC: 10 10*3/uL (ref 4.0–10.5)
nRBC: 0 % (ref 0.0–0.2)

## 2019-02-14 LAB — BASIC METABOLIC PANEL
Anion gap: 11 (ref 5–15)
BUN: 20 mg/dL (ref 8–23)
CO2: 19 mmol/L — ABNORMAL LOW (ref 22–32)
Calcium: 7.5 mg/dL — ABNORMAL LOW (ref 8.9–10.3)
Chloride: 100 mmol/L (ref 98–111)
Creatinine, Ser: 1.08 mg/dL — ABNORMAL HIGH (ref 0.44–1.00)
GFR calc Af Amer: 57 mL/min — ABNORMAL LOW (ref >60)
GFR calc non Af Amer: 49 mL/min — ABNORMAL LOW (ref >60)
Glucose, Bld: 138 mg/dL — ABNORMAL HIGH (ref 70–99)
Potassium: 4.8 mmol/L (ref 3.5–5.1)
Sodium: 130 mmol/L — ABNORMAL LOW (ref 135–145)

## 2019-02-14 MED ORDER — FERROUS SULFATE 325 (65 FE) MG PO TABS: 325.0000 mg | ORAL_TABLET | Freq: Three times a day (TID) | ORAL | 0 refills | Status: DC

## 2019-02-14 MED ORDER — METHOCARBAMOL 500 MG PO TABS: 500.0000 mg | ORAL_TABLET | Freq: Four times a day (QID) | ORAL | 0 refills | Status: DC | PRN

## 2019-02-14 MED ORDER — HYDROCODONE-ACETAMINOPHEN 7.5-325 MG PO TABS: 1.0000 | ORAL_TABLET | Freq: Three times a day (TID) | ORAL | 0 refills | Status: DC | PRN

## 2019-02-14 MED ORDER — HYDROCODONE-ACETAMINOPHEN 7.5-325 MG PO TABS: 1.0000 | ORAL_TABLET | Freq: Four times a day (QID) | ORAL | 0 refills | Status: DC | PRN

## 2019-02-14 MED ORDER — POLYETHYLENE GLYCOL 3350 17 G PO PACK: 17.0000 g | PACK | Freq: Two times a day (BID) | ORAL | 0 refills | Status: DC

## 2019-02-14 MED ORDER — ASPIRIN 81 MG PO CHEW: 81.0000 mg | CHEWABLE_TABLET | Freq: Two times a day (BID) | ORAL | 0 refills | Status: AC

## 2019-02-14 MED ORDER — DOCUSATE SODIUM 100 MG PO CAPS: 100.0000 mg | ORAL_CAPSULE | Freq: Two times a day (BID) | ORAL | 0 refills | Status: DC

## 2019-02-14 NOTE — TOC Transition Note (Signed)
Transition of Care [facility]) - CM/SW Discharge Note   Patient Details  Name: Julie Wilkerson MRN: [id number] Date of Birth: 08/24/1940  [facility] of [facility] CM/SW Contact:  Leeroy Cha, RN Phone Number: 02/14/2019, 12:47 PM   Clinical Narrative:    Pt dcd to home with hep and equip.   Final next level of care: Home/Self Care Barriers to Discharge: No Barriers Identified   Patient Goals and CMS Choice Patient states their goals for this hospitalization and ongoing recovery are:: to go home CMS Medicare.gov Compare Post Acute Care list provided to:: Patient    Discharge Placement                       Discharge Plan and Services   Discharge Planning Services: CM Consult Post Acute Care Choice: Durable Medical Equipment          DME Arranged: Walker rolling, 3-N-1 DME Agency: [employer] Date DME Agency Contacted: 02/14/19 Time DME Agency Contacted: 0900 Representative spoke with at DME Agency: nathan            Social Determinants of Health ([place]) Interventions     Readmission Risk Interventions No flowsheet data found.

## 2019-02-14 NOTE — Progress Notes (Signed)
     Subjective: 1 Day Post-Op Procedure(s) (LRB): TOTAL HIP ARTHROPLASTY ANTERIOR APPROACH (Left)   Seen by Dr. Alvan Dame. Patient reports pain as mild, pain controlled with medication.  No reported events throughout the night.  Patient feels that she is progressing well.  Patient is ready to be discharged home, if she does well with therapy.  Patient follow-up in the clinic in 2 weeks.     Objective:   VITALS:   Vitals:   02/14/19 0130 02/14/19 0500  BP: 129/63 125/61  Pulse: (!) 58 (!) 58  Resp: 14 14  Temp: 97.7 F (36.5 C) 97.6 F (36.4 C)  SpO2: 98% 98%    Dorsiflexion/Plantar flexion intact Incision: dressing C/D/I No cellulitis present Compartment soft  LABS Recent Labs    02/14/19 0251  HGB 9.1*  HCT 28.5*  WBC 10.0  PLT 180    Recent Labs    02/14/19 0251  NA 130*  K 4.8  BUN 20  CREATININE 1.08*  GLUCOSE 138*     Assessment/Plan: 1 Day Post-Op Procedure(s) (LRB): TOTAL HIP ARTHROPLASTY ANTERIOR APPROACH (Left) Foley cath DC'd Advance diet Up with therapy D/C IV fluids Discharge home Follow up in 2 weeks at [facility] Follow up with OLIN,Emmaus Brandi D in 2 weeks.  Contact information:  [facility]) [address], [place] [id number]    Overweight (BMI 25-29.9) Estimated body mass index is 25.71 kg/m as calculated from the following:   Height as of this encounter: 4\' 10"  (1.473 m).   Weight as of this encounter: 55.8 kg. Patient also counseled that weight may inhibit the healing process Patient counseled that losing weight will help with future health issues         West Pugh. Chelli Yerkes   PAC  02/14/2019, 7:45 AM

## 2019-02-14 NOTE — Plan of Care (Signed)
Plan of care reviewed and discussed with the patient. 

## 2019-02-14 NOTE — Progress Notes (Signed)
Physical Therapy Treatment Patient Details Name: Julie Wilkerson MRN: [id number] DOB: 03/21/1941 Today's Date: 02/14/2019    History of Present Illness Pt is 78 y.o. female s/p Lt THA ant approach on 02/13/19. She has PMH significant for HTN ,HLD, neck pain, back pain, depression, arthritis, and history of breast cancer.    PT Comments    POD # 1 pm session Assisted with amb a further distance.  Practiced stairs.  Assisted to bathroom.  Addressed all mobility questions, discussed appropriate activity, educated on use of ICE.  Pt ready for D/C to home.   Follow Up Recommendations  (HEP)     Equipment Recommendations  (has a walker and daughter ordering a 3:1 [place])    Recommendations for Other Services       Precautions / Restrictions Precautions Precautions: Fall Restrictions Weight Bearing Restrictions: No    Mobility  Bed Mobility Overal bed mobility: Needs Assistance Bed Mobility: Supine to Sit     Supine to sit: Min assist     General bed mobility comments: Pt OOB in recliner  Transfers Overall transfer level: Needs assistance Equipment used: Rolling walker (2 wheeled) Transfers: Sit to/from Stand Sit to Stand: Min guard;Supervision         General transfer comment: 25% VC's on proper hand placement and safety with turns  Ambulation/Gait Ambulation/Gait assistance: Supervision;Min guard Gait Distance (Feet): 85 Feet Assistive device: Rolling walker (2 wheeled) Gait Pattern/deviations: Step-to pattern;Decreased step length - right;Decreased step length - left;Decreased stride length;Decreased stance time - left;Decreased weight shift to left Gait velocity: decreased   General Gait Details: small steps initaially then increased step length with increased distance.  25% VC's on safety with turns and proper walker to self distance   Stairs Stairs: Yes Stairs assistance: Min guard Stair Management: One rail Left;Step to pattern;Forwards Number of  Stairs: 2 General stair comments: 25% VC;s on proper tech and safety.   Wheelchair Mobility    Modified Rankin (Stroke Patients Only)       Balance                                            Cognition Arousal/Alertness: Awake/alert Behavior During Therapy: WFL for tasks assessed/performed Overall Cognitive Status: Within Functional Limits for tasks assessed                                        Exercises      General Comments        Pertinent Vitals/Pain Pain Assessment: 0-10 Pain Score: 2  Pain Location: Lt hip Pain Descriptors / Indicators: Grimacing;Guarding Pain Intervention(s): Monitored during session;Repositioned;Ice applied    Home Living                      Prior Function            PT Goals (current goals can now be found in the care plan section) Progress towards PT goals: Progressing toward goals    Frequency    7X/week      PT Plan Current plan remains appropriate    Co-evaluation              AM-PAC PT "6 Clicks" Mobility   Outcome Measure  Help needed turning from your back to your  side while in a flat bed without using bedrails?: A Little Help needed moving from lying on your back to sitting on the side of a flat bed without using bedrails?: A Little Help needed moving to and from a bed to a chair (including a wheelchair)?: A Little Help needed standing up from a chair using your arms (e.g., wheelchair or bedside chair)?: A Little Help needed to walk in hospital room?: A Little Help needed climbing 3-5 steps with a railing? : A Little 6 Click Score: 18    End of Session Equipment Utilized During Treatment: Gait belt Activity Tolerance: Patient tolerated treatment well Patient left: with call bell/phone within reach;in chair;with chair alarm set;with family/visitor present Nurse Communication: Mobility status PT Visit Diagnosis: Other abnormalities of gait and mobility  (R26.89);Muscle weakness (generalized) (M62.81);Difficulty in walking, not elsewhere classified (R26.2);Unsteadiness on feet (R26.81)     Time: [id number] PT Time Calculation (min) (ACUTE ONLY): 27 min  Charges:  $Gait Training: 8-22 mins $Therapeutic Activity: 8-22 mins                     Rica Koyanagi  PTA Acute  Rehabilitation Services Pager      [phone] Office      [phone]

## 2019-02-14 NOTE — Progress Notes (Signed)
Physical Therapy Treatment Patient Details Name: Julie Wilkerson MRN: [id number] DOB: [birth year] Today's Date: 02/14/2019    History of Present Illness Pt is 78 y.o. female s/p Lt THA ant approach on 02/13/19. She has PMH significant for HTN ,HLD, neck pain, back pain, depression, arthritis, and history of breast cancer.    PT Comments    Assisted OOB.  General bed mobility comments: demonstarted and instructed how to use belt loop to self assist LE OOB.  Assisted with amb a greater distance.  Assisted with toileting.  Instructed on safety.  Performed some TE'sa following HEP handout.  Instructed on proper tech and use of ICE.   Pt will need another session to address stairs.   Follow Up Recommendations  (HEP)     Equipment Recommendations  (has a walker and daughter ordering a 3:1 [place])    Recommendations for Other Services       Precautions / Restrictions Precautions Precautions: Fall Restrictions Weight Bearing Restrictions: No    Mobility  Bed Mobility Overal bed mobility: Needs Assistance Bed Mobility: Supine to Sit     Supine to sit: Min assist     General bed mobility comments: demonstarted and instructed how to use belt loop to self assist LE OOB  Transfers Overall transfer level: Needs assistance Equipment used: Rolling walker (2 wheeled) Transfers: Sit to/from Stand Sit to Stand: Min guard;Supervision         General transfer comment: 25% VC's on proper hand placement and safety with turns  Ambulation/Gait Ambulation/Gait assistance: Supervision;Min guard Gait Distance (Feet): 63 Feet Assistive device: Rolling walker (2 wheeled) Gait Pattern/deviations: Step-to pattern;Decreased step length - right;Decreased step length - left;Decreased stride length;Decreased stance time - left;Decreased weight shift to left Gait velocity: decreased   General Gait Details: small steps initaially then increased step length with increased distance.  25% VC's on  safety with turns and proper walker to self distance   Stairs             Wheelchair Mobility    Modified Rankin (Stroke Patients Only)       Balance                                            Cognition Arousal/Alertness: Awake/alert Behavior During Therapy: WFL for tasks assessed/performed Overall Cognitive Status: Within Functional Limits for tasks assessed                                        Exercises   Total Hip Replacement TE's 10 reps ankle pumps 10 reps knee presses 10 reps heel slides 10 reps SAQ's 10 reps ABD Followed by ICE     General Comments        Pertinent Vitals/Pain Pain Assessment: 0-10 Pain Score: 2  Pain Location: Lt hip Pain Descriptors / Indicators: Grimacing;Guarding Pain Intervention(s): Monitored during session;Repositioned;Ice applied    Home Living                      Prior Function            PT Goals (current goals can now be found in the care plan section) Progress towards PT goals: Progressing toward goals    Frequency    7X/week  PT Plan Current plan remains appropriate    Co-evaluation              AM-PAC PT "6 Clicks" Mobility   Outcome Measure  Help needed turning from your back to your side while in a flat bed without using bedrails?: A Little Help needed moving from lying on your back to sitting on the side of a flat bed without using bedrails?: A Little Help needed moving to and from a bed to a chair (including a wheelchair)?: A Little Help needed standing up from a chair using your arms (e.g., wheelchair or bedside chair)?: A Little Help needed to walk in hospital room?: A Little Help needed climbing 3-5 steps with a railing? : A Little 6 Click Score: 18    End of Session Equipment Utilized During Treatment: Gait belt Activity Tolerance: Patient tolerated treatment well Patient left: with call bell/phone within reach;in chair;with chair  alarm set;with family/visitor present Nurse Communication: Mobility status PT Visit Diagnosis: Other abnormalities of gait and mobility (R26.89);Muscle weakness (generalized) (M62.81);Difficulty in walking, not elsewhere classified (R26.2);Unsteadiness on feet (R26.81)     Time: [id number] PT Time Calculation (min) (ACUTE ONLY): 28 min  Charges:  $Gait Training: 8-22 mins $Therapeutic Exercise: 8-22 mins                     {Tyleek Smick  PTA Acute  Rehabilitation Services Pager      [phone] Office      [phone]

## 2019-02-15 ENCOUNTER — Encounter (HOSPITAL_COMMUNITY): Payer: Self-pay

## 2019-02-15 ENCOUNTER — Other Ambulatory Visit: Payer: Self-pay

## 2019-02-15 ENCOUNTER — Inpatient Hospital Stay (HOSPITAL_COMMUNITY)
Admission: EM | Admit: 2019-02-15 | Discharge: 2019-02-19 | DRG: 291 | Disposition: A | Discharge status: Home Health | Payer: Medicare HMO | Attending: Internal Medicine | Admitting: Internal Medicine

## 2019-02-15 ENCOUNTER — Emergency Department (HOSPITAL_COMMUNITY): Payer: Medicare HMO

## 2019-02-15 DIAGNOSIS — C50919 Malignant neoplasm of unspecified site of unspecified female breast: Secondary | ICD-10-CM | POA: Diagnosis present

## 2019-02-15 DIAGNOSIS — J9601 Acute respiratory failure with hypoxia: Secondary | ICD-10-CM | POA: Diagnosis present

## 2019-02-15 DIAGNOSIS — M810 Age-related osteoporosis without current pathological fracture: Secondary | ICD-10-CM | POA: Diagnosis present

## 2019-02-15 DIAGNOSIS — Z923 Personal history of irradiation: Secondary | ICD-10-CM

## 2019-02-15 DIAGNOSIS — E871 Hypo-osmolality and hyponatremia: Secondary | ICD-10-CM | POA: Diagnosis present

## 2019-02-15 DIAGNOSIS — K589 Irritable bowel syndrome without diarrhea: Secondary | ICD-10-CM | POA: Diagnosis present

## 2019-02-15 DIAGNOSIS — Z20828 Contact with and (suspected) exposure to other viral communicable diseases: Secondary | ICD-10-CM | POA: Diagnosis not present

## 2019-02-15 DIAGNOSIS — Z96642 Presence of left artificial hip joint: Secondary | ICD-10-CM

## 2019-02-15 DIAGNOSIS — I10 Essential (primary) hypertension: Secondary | ICD-10-CM | POA: Diagnosis present

## 2019-02-15 DIAGNOSIS — I110 Hypertensive heart disease with heart failure: Principal | ICD-10-CM | POA: Diagnosis present

## 2019-02-15 DIAGNOSIS — Z800 Family history of malignant neoplasm of digestive organs: Secondary | ICD-10-CM

## 2019-02-15 DIAGNOSIS — I5031 Acute diastolic (congestive) heart failure: Secondary | ICD-10-CM | POA: Diagnosis present

## 2019-02-15 DIAGNOSIS — J45909 Unspecified asthma, uncomplicated: Secondary | ICD-10-CM | POA: Diagnosis present

## 2019-02-15 DIAGNOSIS — J189 Pneumonia, unspecified organism: Secondary | ICD-10-CM | POA: Diagnosis present

## 2019-02-15 DIAGNOSIS — R509 Fever, unspecified: Secondary | ICD-10-CM

## 2019-02-15 DIAGNOSIS — R0602 Shortness of breath: Secondary | ICD-10-CM | POA: Diagnosis not present

## 2019-02-15 DIAGNOSIS — Z853 Personal history of malignant neoplasm of breast: Secondary | ICD-10-CM

## 2019-02-15 DIAGNOSIS — R51 Headache: Secondary | ICD-10-CM | POA: Diagnosis not present

## 2019-02-15 DIAGNOSIS — M1990 Unspecified osteoarthritis, unspecified site: Secondary | ICD-10-CM | POA: Diagnosis present

## 2019-02-15 DIAGNOSIS — Z8349 Family history of other endocrine, nutritional and metabolic diseases: Secondary | ICD-10-CM

## 2019-02-15 DIAGNOSIS — Z7951 Long term (current) use of inhaled steroids: Secondary | ICD-10-CM

## 2019-02-15 DIAGNOSIS — D649 Anemia, unspecified: Secondary | ICD-10-CM | POA: Diagnosis not present

## 2019-02-15 DIAGNOSIS — A419 Sepsis, unspecified organism: Secondary | ICD-10-CM | POA: Diagnosis present

## 2019-02-15 DIAGNOSIS — K5900 Constipation, unspecified: Secondary | ICD-10-CM

## 2019-02-15 DIAGNOSIS — E875 Hyperkalemia: Secondary | ICD-10-CM | POA: Diagnosis present

## 2019-02-15 DIAGNOSIS — Z806 Family history of leukemia: Secondary | ICD-10-CM

## 2019-02-15 DIAGNOSIS — E785 Hyperlipidemia, unspecified: Secondary | ICD-10-CM | POA: Diagnosis present

## 2019-02-15 DIAGNOSIS — Z79890 Hormone replacement therapy (postmenopausal): Secondary | ICD-10-CM

## 2019-02-15 DIAGNOSIS — R6510 Systemic inflammatory response syndrome (SIRS) of non-infectious origin without acute organ dysfunction: Secondary | ICD-10-CM | POA: Diagnosis present

## 2019-02-15 DIAGNOSIS — I959 Hypotension, unspecified: Secondary | ICD-10-CM | POA: Diagnosis present

## 2019-02-15 DIAGNOSIS — D62 Acute posthemorrhagic anemia: Secondary | ICD-10-CM | POA: Diagnosis present

## 2019-02-15 DIAGNOSIS — Z7982 Long term (current) use of aspirin: Secondary | ICD-10-CM

## 2019-02-15 DIAGNOSIS — Z9889 Other specified postprocedural states: Secondary | ICD-10-CM

## 2019-02-15 DIAGNOSIS — Z8249 Family history of ischemic heart disease and other diseases of the circulatory system: Secondary | ICD-10-CM

## 2019-02-15 DIAGNOSIS — E039 Hypothyroidism, unspecified: Secondary | ICD-10-CM | POA: Diagnosis present

## 2019-02-15 LAB — CBC WITH DIFFERENTIAL/PLATELET
Abs Immature Granulocytes: 0.11 10*3/uL — ABNORMAL HIGH (ref 0.00–0.07)
Basophils Absolute: 0 10*3/uL (ref 0.0–0.1)
Basophils Relative: 0 %
Eosinophils Absolute: 0 10*3/uL (ref 0.0–0.5)
Eosinophils Relative: 0 %
HCT: 27.1 % — ABNORMAL LOW (ref 36.0–46.0)
Hemoglobin: 8.9 g/dL — ABNORMAL LOW (ref 12.0–15.0)
Immature Granulocytes: 1 %
Lymphocytes Relative: 16 %
Lymphs Abs: 2 10*3/uL (ref 0.7–4.0)
MCH: 29.8 pg (ref 26.0–34.0)
MCHC: 32.8 g/dL (ref 30.0–36.0)
MCV: 90.6 fL (ref 80.0–100.0)
Monocytes Absolute: 1.5 10*3/uL — ABNORMAL HIGH (ref 0.1–1.0)
Monocytes Relative: 13 %
Neutro Abs: 8.6 10*3/uL — ABNORMAL HIGH (ref 1.7–7.7)
Neutrophils Relative %: 70 %
Platelets: 156 10*3/uL (ref 150–400)
RBC: 2.99 MIL/uL — ABNORMAL LOW (ref 3.87–5.11)
RDW: 14.7 % (ref 11.5–15.5)
WBC: 12.2 10*3/uL — ABNORMAL HIGH (ref 4.0–10.5)
nRBC: 0 % (ref 0.0–0.2)

## 2019-02-15 LAB — URINALYSIS, ROUTINE W REFLEX MICROSCOPIC
Bilirubin Urine: NEGATIVE
Glucose, UA: NEGATIVE mg/dL
Hgb urine dipstick: NEGATIVE
Ketones, ur: NEGATIVE mg/dL
Leukocytes,Ua: NEGATIVE
Nitrite: NEGATIVE
Protein, ur: NEGATIVE mg/dL
Specific Gravity, Urine: 1.011 (ref 1.005–1.030)
pH: 5 (ref 5.0–8.0)

## 2019-02-15 LAB — COMPREHENSIVE METABOLIC PANEL
ALT: 15 U/L (ref 0–44)
AST: 51 U/L — ABNORMAL HIGH (ref 15–41)
Albumin: 3.6 g/dL (ref 3.5–5.0)
Alkaline Phosphatase: 58 U/L (ref 38–126)
Anion gap: 9 (ref 5–15)
BUN: 31 mg/dL — ABNORMAL HIGH (ref 8–23)
CO2: 21 mmol/L — ABNORMAL LOW (ref 22–32)
Calcium: 8.1 mg/dL — ABNORMAL LOW (ref 8.9–10.3)
Chloride: 98 mmol/L (ref 98–111)
Creatinine, Ser: 1.13 mg/dL — ABNORMAL HIGH (ref 0.44–1.00)
GFR calc Af Amer: 54 mL/min — ABNORMAL LOW (ref >60)
GFR calc non Af Amer: 47 mL/min — ABNORMAL LOW (ref >60)
Glucose, Bld: 121 mg/dL — ABNORMAL HIGH (ref 70–99)
Potassium: 3.9 mmol/L (ref 3.5–5.1)
Sodium: 128 mmol/L — ABNORMAL LOW (ref 135–145)
Total Bilirubin: 0.7 mg/dL (ref 0.3–1.2)
Total Protein: 6.5 g/dL (ref 6.5–8.1)

## 2019-02-15 MED ORDER — MORPHINE SULFATE (PF) 4 MG/ML IV SOLN
4.0000 mg | Freq: Once | INTRAVENOUS | Status: AC
  Administered 2019-02-15: 4 mg via INTRAVENOUS
  Filled 2019-02-15: qty 1

## 2019-02-15 MED ORDER — ONDANSETRON HCL 4 MG/2ML IJ SOLN
4.0000 mg | Freq: Once | INTRAMUSCULAR | Status: AC
  Administered 2019-02-15: 4 mg via INTRAVENOUS
  Filled 2019-02-15: qty 2

## 2019-02-15 MED ORDER — SODIUM CHLORIDE 0.9 % IV BOLUS
1000.0000 mL | Freq: Once | INTRAVENOUS | Status: AC
  Administered 2019-02-15: 1000 mL via INTRAVENOUS

## 2019-02-15 MED ORDER — IOHEXOL 350 MG/ML SOLN
100.0000 mL | Freq: Once | INTRAVENOUS | Status: AC | PRN
  Administered 2019-02-15: 75 mL via INTRAVENOUS

## 2019-02-15 MED ORDER — SODIUM CHLORIDE 0.9 % IV SOLN
2.0000 g | Freq: Once | INTRAVENOUS | Status: AC
  Administered 2019-02-16: 2 g via INTRAVENOUS
  Filled 2019-02-15: qty 2

## 2019-02-15 MED ORDER — VANCOMYCIN HCL IN DEXTROSE 1-5 GM/200ML-% IV SOLN
1000.0000 mg | Freq: Once | INTRAVENOUS | Status: AC
  Administered 2019-02-16: 1000 mg via INTRAVENOUS
  Filled 2019-02-15: qty 200

## 2019-02-15 MED ORDER — SODIUM CHLORIDE (PF) 0.9 % IJ SOLN
INTRAMUSCULAR | Status: AC
  Administered 2019-02-15: 23:00:00
  Filled 2019-02-15: qty 50

## 2019-02-15 NOTE — ED Triage Notes (Addendum)
Patient states she was recently discharged from the [facility] 2 days ago s/p left hip replacement.  Patient c/o SOB, chills, hypotension, and headache. BP in triage- 121/60

## 2019-02-15 NOTE — ED Notes (Signed)
REQUESTING SOMETHING FOR PAIN FOR HER HEADACHE

## 2019-02-15 NOTE — ED Provider Notes (Signed)
[facility] DEPT Provider Note   CSN: [id number] Arrival date & time: 02/15/19  1818     History   Chief Complaint Chief Complaint  Patient presents with  . Shortness of Breath  . Hypotension  . Headache    HPI Julie Wilkerson is a 78 y.o. female.     Pt presents to the ED today with sob and headache.  The pt had a left hip replacement on 9/22.  She developed headache today with some sob.  No f/c.  Covid on 9/19 was negative.  No cough.     Past Medical History:  Diagnosis Date  . Anemia   . Anginal pain ([place])   . Anxiety   . Arthritis   . Asthma   . Breast cancer ([place])    2002, left, encapsulated, microcalcifications. lumpectomy, radtiation x 30  . Breast cancer in female [facility])   . Change in mole 06/08/2016  . Colitis   . Complication of anesthesia    VERY SENSITIVE TO ANESTHESIA   . Decreased visual acuity 01/16/2017  . Depression   . Dyspnea 11/04/2016   with asthma exacerbation only  . Dysuria 11/04/2016  . Family history of adverse reaction to anesthesia   . Gluten intolerance   . History of chicken pox   . History of Helicobacter pylori infection 08/01/2015  . Hyperlipidemia   . Hypertension   . Hypothyroid   . IBS (irritable bowel syndrome)   . Left leg pain 03/01/2017  . Low back pain 08/10/2015  . Neck pain 03/01/2017  . Osteoporosis   . Personal history of radiation therapy   . Pneumonia   . PONV (postoperative nausea and vomiting)   . RLQ discomfort 03/01/2017  . Stomach cramps   . Urinary tract infection 11/04/2016    Patient Active Problem List   Diagnosis Date Noted  . CAP (community acquired pneumonia) 02/16/2019  . Sepsis due to pneumonia ([place]) 02/16/2019  . Acute blood loss anemia 02/16/2019  . Acute diastolic CHF (congestive heart failure) ([place]) 02/16/2019  . Hyponatremia 02/16/2019  . Acute respiratory failure with hypoxia ([place]) 02/16/2019  . Overweight (BMI 25.0-29.9) 02/14/2019  . S/P left THA, AA  02/13/2019  . Accelerating angina ([place]) 02/09/2019  . Atypical chest pain 02/04/2019  . Bursitis of left hip 09/04/2018  . Pelvic pain 07/02/2018  . Left shoulder pain 05/07/2018  . Left hand pain 05/07/2018  . Change in bowel habits 02/22/2018  . Nausea and vomiting 02/22/2018  . Poor appetite 02/22/2018  . Anemia 01/17/2018  . Constipation 01/17/2018  . Urinary frequency 12/22/2017  . Right hip pain 10/23/2017  . Elevated sed rate 10/23/2017  . Influenza due to identified influenza virus 09/04/2017  . Bronchitis 04/26/2017  . Acute pansinusitis 04/26/2017  . Memory loss 03/28/2017  . Bilateral carotid bruits 03/28/2017  . Left hip pain 03/01/2017  . Neck pain 03/01/2017  . RLQ discomfort 03/01/2017  . Decreased visual acuity 01/16/2017  . 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 Hyperglycemia 11/07/2016  . 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 Abdominal pain 11/07/2016  . Urinary tract infection 11/04/2016  . Change in mole 06/08/2016  . 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 Vitamin D deficiency 04/29/2016  . Tremor 02/15/2016  . Diarrhea 11/16/2015  . Low back pain 08/10/2015  . History of Helicobacter pylori infection 08/01/2015  . Cough variant asthma  vs UACS from ACEi    . Breast cancer in  female [facility])   . Hyperlipidemia   . Essential hypertension   . Osteoporosis   . Hypothyroid   . IBS (irritable bowel syndrome)   . Gluten intolerance   . History of chicken pox     Past Surgical History:  Procedure Laterality Date  . ABDOMINAL HYSTERECTOMY     partial  . APPENDECTOMY    . BREAST LUMPECTOMY Left 2002  . CHOLECYSTECTOMY  Age 57 or 9  . COLONOSCOPY  2017  . EYE SURGERY Left    cataract  . LEFT HEART CATH AND CORONARY ANGIOGRAPHY N/A 02/09/2019   Procedure: LEFT HEART CATH AND CORONARY ANGIOGRAPHY;  Surgeon: Nelva Bush, MD;  Location: [facility];  Service: Cardiovascular;  Laterality: N/A;  .  TONSILLECTOMY    . TOTAL HIP ARTHROPLASTY Left 02/13/2019   Procedure: TOTAL HIP ARTHROPLASTY ANTERIOR APPROACH;  Surgeon: Paralee Cancel, MD;  Location: [facility];  Service: Orthopedics;  Laterality: Left;  70 mins     OB History    Gravida  4   Para  3   Term  3   Preterm      AB  1   Living  3     SAB  1   TAB      Ectopic      Multiple      Live Births  3            Home Medications    Prior to Admission medications   Medication Sig Start Date End Date Taking? Authorizing Provider  aspirin (ASPIRIN CHILDRENS) 81 MG chewable tablet Chew 1 tablet (81 mg total) by mouth 2 (two) times daily. Take for 4 weeks, then resume regular dose. 02/14/19 03/16/19 Yes Babish, Rodman Key, PA-C  atorvastatin (LIPITOR) 20 MG tablet Take 1 tablet (20 mg total) by mouth daily. 02/12/19 05/13/19 Yes Tobb, Kardie, DO  budesonide-formoterol (SYMBICORT) 160-4.5 MCG/ACT inhaler Inhale 2 puffs into the lungs 2 (two) times daily as needed (wheezing).   Yes [provider]  citalopram (CELEXA) 20 MG tablet TAKE 1  TABLET BY MOUTH ONCE DAILY Patient taking differently: Take 20 mg by mouth at bedtime.  09/04/18  Yes [name], MD  docusate sodium (COLACE) 100 MG capsule Take 1 capsule (100 mg total) by mouth 2 (two) times daily. 02/14/19  Yes Babish, Rodman Key, PA-C  famotidine (PEPCID) 20 MG tablet Take 1 tablet (20 mg total) by mouth 2 (two) times daily. Patient taking differently: Take 20 mg by mouth daily.  11/22/18  Yes Armbruster, Carlota Raspberry, MD  ferrous sulfate (FERROUSUL) 325 (65 FE) MG tablet Take 1 tablet (325 mg total) by mouth 3 (three) times daily with meals for 14 days. 02/14/19 02/28/19 Yes Babish, Rodman Key, PA-C  fluticasone (FLOVENT HFA) 110 MCG/ACT inhaler Inhale 1 puff into the lungs 2 (two) times daily. Patient taking differently: Inhale 1 puff into the lungs 2 (two) times daily as needed (shortness of breath or wheezing).  10/22/18  Yes [name], MD   HYDROcodone-acetaminophen (NORCO) 7.5-325 MG tablet Take 1-2 tablets by mouth every 8 (eight) hours as needed for moderate pain. 02/14/19  Yes Babish, Rodman Key, PA-C  hyoscyamine (LEVSIN SL) 0.125 MG SL tablet Place 1 tablet (0.125 mg total) under the tongue every 4 (four) hours as needed for cramping. 09/04/18  Yes [name], MD  levothyroxine (SYNTHROID, LEVOTHROID) 50 MCG tablet Take 1 tablet (50 mcg total) by mouth daily. 09/04/18  Yes [name], MD  lisinopril (PRINIVIL,ZESTRIL)  20 MG tablet Take 1 tablet (20 mg total) by mouth daily. Patient taking differently: Take 20 mg by mouth at bedtime.  09/04/18  Yes [name], MD  methocarbamol (ROBAXIN) 500 MG tablet Take 1 tablet (500 mg total) by mouth every 6 (six) hours as needed for muscle spasms. 02/14/19  Yes Babish, Rodman Key, PA-C  metoprolol succinate (TOPROL-XL) 25 MG 24 hr tablet Take 1 tablet (25 mg total) by mouth daily. Patient taking differently: Take 25 mg by mouth at bedtime.  09/04/18  Yes [name], MD  omega-3 acid ethyl esters (LOVAZA) 1 g capsule Take 2 g by mouth daily.   Yes [provider]  polyethylene glycol (MIRALAX / GLYCOLAX) 17 g packet Take 17 g by mouth 2 (two) times daily. 02/14/19  Yes Babish, Rodman Key, PA-C  primidone (MYSOLINE) 50 MG tablet Take 1 tablet (50 mg total) by mouth at bedtime. 09/19/18  Yes Tat, Eustace Quail, DO  Probiotic Product (PROBIOTIC DAILY PO) Take 1 tablet by mouth daily.    Yes [provider]  sucralfate (CARAFATE) 1 g tablet Take 1 tablet (1 g total) by mouth every 6 (six) hours as needed. Patient taking differently: Take 1 g by mouth every 6 (six) hours as needed. Stomach ulcers 11/22/18  Yes Armbruster, Carlota Raspberry, MD    Family History Family History  Problem Relation Age of Onset  . Colitis Mother   . Irritable bowel syndrome Mother   . Hypertension Father   . Hyperlipidemia Brother   . Heart attack Brother   . Stomach cancer Paternal Grandmother 36  .  Hyperlipidemia Brother   . Heart attack Brother   . Hyperlipidemia Brother   . Heart attack Brother   . Hyperlipidemia Sister   . Leukemia Daughter   . Arthritis Daughter   . Heart disease Daughter        ASD vs VSD  . Colon cancer Neg Hx     Social History Social History   Tobacco Use  . Smoking status: Never Smoker  . Smokeless tobacco: Never Used  Substance Use Topics  . Alcohol use: Never    Alcohol/week: 0.0 standard drinks    Frequency: Never  . Drug use: Never     Allergies   Patient has no known allergies.   Review of Systems Review of Systems  Respiratory: Positive for shortness of breath.   Neurological: Positive for headaches.  All other systems reviewed and are negative.    Physical Exam Updated Vital Signs BP (!) 128/57   Pulse 71   Temp 99 F (37.2 C) (Oral)   Resp 11   Ht 4\' 10"  (1.473 m)   Wt 63.3 kg   SpO2 96%   BMI 29.17 kg/m   Physical Exam Vitals signs and nursing note reviewed.  Constitutional:      Appearance: She is well-developed.  HENT:     Head: Normocephalic and atraumatic.     Mouth/Throat:     Mouth: Mucous membranes are moist.     Pharynx: Oropharynx is clear.  Eyes:     Extraocular Movements: Extraocular movements intact.     Pupils: Pupils are equal, round, and reactive to light.  Neck:     Musculoskeletal: Normal range of motion and neck supple.  Cardiovascular:     Rate and Rhythm: Normal rate and regular rhythm.  Pulmonary:     Effort: Pulmonary effort is normal.     Breath sounds: Normal breath sounds.  Abdominal:  General: Bowel sounds are normal.     Palpations: Abdomen is soft.  Musculoskeletal: Normal range of motion.  Skin:    General: Skin is warm and dry.     Capillary Refill: Capillary refill takes less than 2 seconds.  Neurological:     General: No focal deficit present.     Mental Status: She is alert and oriented to person, place, and time.  Psychiatric:        Mood and Affect: Mood  normal.        Behavior: Behavior normal.      ED Treatments / Results  Labs (all labs ordered are listed, but only abnormal results are displayed) Labs Reviewed  COMPREHENSIVE METABOLIC PANEL - Abnormal; Notable for the following components:      Result Value   Sodium 128 (*)    CO2 21 (*)    Glucose, Bld 121 (*)    BUN 31 (*)    Creatinine, Ser 1.13 (*)    Calcium 8.1 (*)    AST 51 (*)    GFR calc non Af Amer 47 (*)    GFR calc Af Amer 54 (*)    All other components within normal limits  CBC WITH DIFFERENTIAL/PLATELET - Abnormal; Notable for the following components:   WBC 12.2 (*)    RBC 2.99 (*)    Hemoglobin 8.9 (*)    HCT 27.1 (*)    Neutro Abs 8.6 (*)    Monocytes Absolute 1.5 (*)    Abs Immature Granulocytes 0.11 (*)    All other components within normal limits  URINALYSIS, ROUTINE W REFLEX MICROSCOPIC - Abnormal; Notable for the following components:   Color, Urine STRAW (*)    All other components within normal limits  CBC WITH DIFFERENTIAL/PLATELET - Abnormal; Notable for the following components:   RBC 2.53 (*)    Hemoglobin 7.5 (*)    HCT 22.9 (*)    All other components within normal limits  COMPREHENSIVE METABOLIC PANEL - Abnormal; Notable for the following components:   Sodium 132 (*)    CO2 20 (*)    BUN 24 (*)    Calcium 7.6 (*)    Total Protein 5.4 (*)    Albumin 2.9 (*)    GFR calc non Af Amer 54 (*)    All other components within normal limits  BRAIN NATRIURETIC PEPTIDE - Abnormal; Notable for the following components:   B Natriuretic Peptide 598.7 (*)    All other components within normal limits  OSMOLALITY - Abnormal; Notable for the following components:   Osmolality 273 (*)    All other components within normal limits  T4, FREE - Abnormal; Notable for the following components:   Free T4 1.14 (*)    All other components within normal limits  PROTIME-INR - Abnormal; Notable for the following components:   Prothrombin Time 15.4 (*)     All other components within normal limits  CBC WITH DIFFERENTIAL/PLATELET - Abnormal; Notable for the following components:   RBC 2.98 (*)    Hemoglobin 8.8 (*)    HCT 27.0 (*)    Platelets 147 (*)    Monocytes Absolute 1.1 (*)    Abs Immature Granulocytes 0.11 (*)    All other components within normal limits  COMPREHENSIVE METABOLIC PANEL - Abnormal; Notable for the following components:   Sodium 133 (*)    Glucose, Bld 110 (*)    Calcium 7.5 (*)    Total Protein 5.6 (*)  Albumin 3.1 (*)    GFR calc non Af Amer 54 (*)    All other components within normal limits  BASIC METABOLIC PANEL - Abnormal; Notable for the following components:   Sodium 134 (*)    CO2 20 (*)    Glucose, Bld 107 (*)    Creatinine, Ser 1.07 (*)    Calcium 7.8 (*)    GFR calc non Af Amer 50 (*)    GFR calc Af Amer 58 (*)    All other components within normal limits  CBC WITH DIFFERENTIAL/PLATELET - Abnormal; Notable for the following components:   RBC 3.11 (*)    Hemoglobin 9.3 (*)    HCT 27.8 (*)    Monocytes Absolute 1.2 (*)    Abs Immature Granulocytes 0.12 (*)    All other components within normal limits  BASIC METABOLIC PANEL - Abnormal; Notable for the following components:   Sodium 133 (*)    CO2 19 (*)    Glucose, Bld 119 (*)    Calcium 7.9 (*)    All other components within normal limits  TROPONIN I (HIGH SENSITIVITY) - Abnormal; Notable for the following components:   Troponin I (High Sensitivity) 26 (*)    All other components within normal limits  TROPONIN I (HIGH SENSITIVITY) - Abnormal; Notable for the following components:   Troponin I (High Sensitivity) 31 (*)    All other components within normal limits  SARS CORONAVIRUS 2 (HOSPITAL ORDER, [facility])  CULTURE, BLOOD (ROUTINE X 2)  LACTIC ACID, PLASMA  LACTIC ACID, PLASMA  OSMOLALITY, URINE  NA AND K (SODIUM & POTASSIUM), RAND UR  CREATININE, URINE, RANDOM  TSH  PROCALCITONIN  APTT  FIBRINOGEN   LACTIC ACID, PLASMA  MAGNESIUM  MAGNESIUM  TYPE AND SCREEN  PREPARE RBC (CROSSMATCH)    EKG None  Radiology Dg Chest Port 1 View  Result Date: 02/17/2019 CLINICAL DATA:  Cough and shortness of breath. EXAM: PORTABLE CHEST 1 VIEW COMPARISON:  02/15/2019 FINDINGS: 0917 hours. Low lung volumes. The cardio pericardial silhouette is enlarged. Basilar atelectasis and/or scarring is again noted, similar to prior. No focal airspace consolidation or pulmonary edema. No pleural effusion. The visualized bony structures of the thorax are intact. Telemetry leads overlie the chest. IMPRESSION: No active disease. Electronically Signed   By: Misty Stanley M.D.   On: 02/17/2019 11:49   Dg Abd Portable 1v  Result Date: 02/17/2019 CLINICAL DATA:  Abdominal pain. EXAM: PORTABLE ABDOMEN - 1 VIEW COMPARISON:  None. FINDINGS: No gaseous small bowel dilatation to suggest obstruction. Moderate stool volume seen along the length of the colon. Convex leftward lumbar scoliosis evident. Patient is status post left hip replacement. IMPRESSION: Moderate stool volume. Electronically Signed   By: Misty Stanley M.D.   On: 02/17/2019 11:47    Procedures Procedures (including critical care time)  Medications Ordered in ED Medications  aspirin chewable tablet 81 mg (81 mg Oral Given 02/18/19 2137)  atorvastatin (LIPITOR) tablet 20 mg (20 mg Oral Given 02/18/19 1012)  mometasone-formoterol (DULERA) 200-5 MCG/ACT inhaler 2 puff (2 puffs Inhalation Given 02/18/19 2103)  citalopram (CELEXA) tablet 20 mg (20 mg Oral Given 02/18/19 2136)  famotidine (PEPCID) tablet 20 mg (20 mg Oral Given 02/18/19 1012)  HYDROcodone-acetaminophen (NORCO/VICODIN) 5-325 MG per tablet 1 tablet (1 tablet Oral Given 02/18/19 0357)  levothyroxine (SYNTHROID) tablet 50 mcg (50 mcg Oral Given 02/19/19 0602)  methocarbamol (ROBAXIN) tablet 500 mg (500 mg Oral Given 02/16/19 1309)  polyethylene  glycol (MIRALAX / GLYCOLAX) packet 17 g (17 g Oral Given 02/18/19  2135)  primidone (MYSOLINE) tablet 50 mg (50 mg Oral Given 02/18/19 2135)  sodium chloride flush (NS) 0.9 % injection 3 mL (3 mLs Intravenous Given 02/18/19 2140)  acetaminophen (TYLENOL) tablet 650 mg (650 mg Oral Given 02/18/19 1813)    Or  acetaminophen (TYLENOL) suppository 650 mg ( Rectal See Alternative 02/18/19 1813)  bisacodyl (DULCOLAX) suppository 10 mg (10 mg Rectal Given 02/18/19 0245)  0.9 %  sodium chloride infusion (Manually program via Guardrails IV Fluids) ( Intravenous Not Given 02/16/19 1547)  Chlorhexidine Gluconate Cloth 2 % PADS 6 each (6 each Topical Given 02/18/19 1013)  albuterol (PROVENTIL) (2.5 MG/3ML) 0.083% nebulizer solution 3 mL (has no administration in time range)  feeding supplement (ENSURE ENLIVE) (ENSURE ENLIVE) liquid 237 mL (237 mLs Oral Given 02/18/19 1537)  senna-docusate (Senokot-S) tablet 2 tablet (2 tablets Oral Given 02/18/19 2135)  simethicone (MYLICON) chewable tablet 80 mg (80 mg Oral Given 02/18/19 2137)  heparin injection 5,000 Units (5,000 Units Subcutaneous Given 02/19/19 0601)  furosemide (LASIX) injection 20 mg (20 mg Intravenous Given 02/18/19 1814)  ferrous sulfate 300 (60 Fe) MG/5ML syrup 300 mg (300 mg Oral Given 02/18/19 1814)  ondansetron (ZOFRAN) injection 4 mg (4 mg Intravenous Given 02/18/19 2317)  morphine 4 MG/ML injection 4 mg (4 mg Intravenous Given 02/15/19 2238)  ondansetron (ZOFRAN) injection 4 mg (4 mg Intravenous Given 02/15/19 2238)  sodium chloride 0.9 % bolus 1,000 mL (0 mLs Intravenous Stopped 02/16/19 0038)  sodium chloride (PF) 0.9 % injection (  Given by Other 02/15/19 2307)  iohexol (OMNIPAQUE) 350 MG/ML injection 100 mL (75 mLs Intravenous Contrast Given 02/15/19 2307)  vancomycin (VANCOCIN) IVPB 1000 mg/200 mL premix (0 mg Intravenous Stopped 02/16/19 0241)  ceFEPIme (MAXIPIME) 2 g in sodium chloride 0.9 % 100 mL IVPB (0 g Intravenous Stopped 02/16/19 0136)  furosemide (LASIX) injection 20 mg (20 mg Intravenous Given 02/16/19 1008)   albumin human 5 % solution 12.5 g ( Intravenous Rate/Dose Verify 02/16/19 1800)  simethicone (MYLICON) chewable tablet 80 mg (80 mg Oral Given 02/17/19 0022)  furosemide (LASIX) injection 20 mg (20 mg Intravenous Given 02/17/19 1434)     Initial Impression / Assessment and Plan / ED Course  I have reviewed the triage vital signs and the nursing notes.  Pertinent labs & imaging results that were available during my care of the patient were reviewed by me and considered in my medical decision making (see chart for details).       Labs and CTs pending at shift change.  Pt signed out to Dr. Randal Buba at shift change.  Final Clinical Impressions(s) / ED Diagnoses   Final diagnoses:  Shortness of breath  Anemia, unspecified type    ED Discharge Orders    None       Isla Pence, MD 02/19/19 [phone]

## 2019-02-15 NOTE — ED Notes (Signed)
PT CALMED AND REASSURED OF PLAN OF CARE. DAUGHTER MADE AWARE. [facility] EMT VERBALIZED VITAL SIGN AND PT CALMED. OBSERVED PT VISUALLY RR LOWER AND RESTING. NO ACUTE DISTRESS

## 2019-02-15 NOTE — ED Notes (Signed)
TRIAGE MADE AWARE. WILL RE ASSESS VITALS

## 2019-02-15 NOTE — ED Notes (Signed)
Pt transported to CTA 

## 2019-02-15 NOTE — Progress Notes (Signed)
A consult was received from an ED physician for vancomycin per pharmacy dosing.  The patient's profile has been reviewed for ht/wt/allergies/indication/available labs.   A one time order has been placed for Vancomycin 1gm iv x1.  Further antibiotics/pharmacy consults should be ordered by admitting physician if indicated.                       Thank you, Nani Skillern Crowford 02/15/2019  11:54 PM

## 2019-02-16 ENCOUNTER — Encounter (HOSPITAL_COMMUNITY): Payer: Self-pay | Admitting: Emergency Medicine

## 2019-02-16 ENCOUNTER — Inpatient Hospital Stay (HOSPITAL_COMMUNITY)
Admit: 2019-02-16 | Discharge: 2019-02-16 | Disposition: A | Discharge status: Home / Self Care | Payer: Medicare HMO | Attending: Internal Medicine | Admitting: Internal Medicine

## 2019-02-16 ENCOUNTER — Inpatient Hospital Stay (HOSPITAL_COMMUNITY): Payer: Medicare HMO

## 2019-02-16 DIAGNOSIS — Z923 Personal history of irradiation: Secondary | ICD-10-CM | POA: Diagnosis not present

## 2019-02-16 DIAGNOSIS — D62 Acute posthemorrhagic anemia: Secondary | ICD-10-CM | POA: Diagnosis not present

## 2019-02-16 DIAGNOSIS — E785 Hyperlipidemia, unspecified: Secondary | ICD-10-CM | POA: Diagnosis present

## 2019-02-16 DIAGNOSIS — A419 Sepsis, unspecified organism: Secondary | ICD-10-CM | POA: Diagnosis not present

## 2019-02-16 DIAGNOSIS — J9601 Acute respiratory failure with hypoxia: Secondary | ICD-10-CM | POA: Diagnosis present

## 2019-02-16 DIAGNOSIS — I5031 Acute diastolic (congestive) heart failure: Secondary | ICD-10-CM

## 2019-02-16 DIAGNOSIS — R109 Unspecified abdominal pain: Secondary | ICD-10-CM | POA: Diagnosis not present

## 2019-02-16 DIAGNOSIS — I110 Hypertensive heart disease with heart failure: Secondary | ICD-10-CM | POA: Diagnosis not present

## 2019-02-16 DIAGNOSIS — M1990 Unspecified osteoarthritis, unspecified site: Secondary | ICD-10-CM | POA: Diagnosis not present

## 2019-02-16 DIAGNOSIS — Z79890 Hormone replacement therapy (postmenopausal): Secondary | ICD-10-CM | POA: Diagnosis not present

## 2019-02-16 DIAGNOSIS — R609 Edema, unspecified: Secondary | ICD-10-CM

## 2019-02-16 DIAGNOSIS — Z96642 Presence of left artificial hip joint: Secondary | ICD-10-CM | POA: Diagnosis not present

## 2019-02-16 DIAGNOSIS — K589 Irritable bowel syndrome without diarrhea: Secondary | ICD-10-CM | POA: Diagnosis present

## 2019-02-16 DIAGNOSIS — Z806 Family history of leukemia: Secondary | ICD-10-CM | POA: Diagnosis not present

## 2019-02-16 DIAGNOSIS — Z20828 Contact with and (suspected) exposure to other viral communicable diseases: Secondary | ICD-10-CM | POA: Diagnosis not present

## 2019-02-16 DIAGNOSIS — R0602 Shortness of breath: Secondary | ICD-10-CM | POA: Diagnosis not present

## 2019-02-16 DIAGNOSIS — Z8249 Family history of ischemic heart disease and other diseases of the circulatory system: Secondary | ICD-10-CM | POA: Diagnosis not present

## 2019-02-16 DIAGNOSIS — J45909 Unspecified asthma, uncomplicated: Secondary | ICD-10-CM | POA: Diagnosis not present

## 2019-02-16 DIAGNOSIS — E039 Hypothyroidism, unspecified: Secondary | ICD-10-CM | POA: Diagnosis present

## 2019-02-16 DIAGNOSIS — Z7951 Long term (current) use of inhaled steroids: Secondary | ICD-10-CM | POA: Diagnosis not present

## 2019-02-16 DIAGNOSIS — I959 Hypotension, unspecified: Secondary | ICD-10-CM | POA: Diagnosis present

## 2019-02-16 DIAGNOSIS — J189 Pneumonia, unspecified organism: Secondary | ICD-10-CM | POA: Diagnosis not present

## 2019-02-16 DIAGNOSIS — R6510 Systemic inflammatory response syndrome (SIRS) of non-infectious origin without acute organ dysfunction: Secondary | ICD-10-CM | POA: Diagnosis not present

## 2019-02-16 DIAGNOSIS — M810 Age-related osteoporosis without current pathological fracture: Secondary | ICD-10-CM | POA: Diagnosis present

## 2019-02-16 DIAGNOSIS — Z7982 Long term (current) use of aspirin: Secondary | ICD-10-CM | POA: Diagnosis not present

## 2019-02-16 DIAGNOSIS — Z800 Family history of malignant neoplasm of digestive organs: Secondary | ICD-10-CM | POA: Diagnosis not present

## 2019-02-16 DIAGNOSIS — R05 Cough: Secondary | ICD-10-CM | POA: Diagnosis not present

## 2019-02-16 DIAGNOSIS — E871 Hypo-osmolality and hyponatremia: Secondary | ICD-10-CM | POA: Diagnosis not present

## 2019-02-16 DIAGNOSIS — Z8349 Family history of other endocrine, nutritional and metabolic diseases: Secondary | ICD-10-CM | POA: Diagnosis not present

## 2019-02-16 DIAGNOSIS — Z853 Personal history of malignant neoplasm of breast: Secondary | ICD-10-CM | POA: Diagnosis not present

## 2019-02-16 HISTORY — DX: Hypo-osmolality and hyponatremia: E87.1

## 2019-02-16 HISTORY — DX: Acute diastolic (congestive) heart failure: I50.31

## 2019-02-16 HISTORY — DX: Sepsis, unspecified organism: A41.9

## 2019-02-16 LAB — COMPREHENSIVE METABOLIC PANEL
ALT: 12 U/L (ref 0–44)
AST: 40 U/L (ref 15–41)
Albumin: 2.9 g/dL — ABNORMAL LOW (ref 3.5–5.0)
Alkaline Phosphatase: 51 U/L (ref 38–126)
Anion gap: 7 (ref 5–15)
BUN: 24 mg/dL — ABNORMAL HIGH (ref 8–23)
CO2: 20 mmol/L — ABNORMAL LOW (ref 22–32)
Calcium: 7.6 mg/dL — ABNORMAL LOW (ref 8.9–10.3)
Chloride: 105 mmol/L (ref 98–111)
Creatinine, Ser: 1 mg/dL (ref 0.44–1.00)
GFR calc Af Amer: >60 mL/min (ref >60)
GFR calc non Af Amer: 54 mL/min — ABNORMAL LOW (ref >60)
Glucose, Bld: 92 mg/dL (ref 70–99)
Potassium: 4.2 mmol/L (ref 3.5–5.1)
Sodium: 132 mmol/L — ABNORMAL LOW (ref 135–145)
Total Bilirubin: 1 mg/dL (ref 0.3–1.2)
Total Protein: 5.4 g/dL — ABNORMAL LOW (ref 6.5–8.1)

## 2019-02-16 LAB — TROPONIN I (HIGH SENSITIVITY)
Troponin I (High Sensitivity): 26 ng/L — ABNORMAL HIGH (ref <18)
Troponin I (High Sensitivity): 31 ng/L — ABNORMAL HIGH (ref <18)

## 2019-02-16 LAB — LACTIC ACID, PLASMA
Lactic Acid, Venous: 0.7 mmol/L (ref 0.5–1.9)
Lactic Acid, Venous: 1.7 mmol/L (ref 0.5–1.9)

## 2019-02-16 LAB — CBC WITH DIFFERENTIAL/PLATELET
Abs Immature Granulocytes: 0.07 10*3/uL (ref 0.00–0.07)
Basophils Absolute: 0 10*3/uL (ref 0.0–0.1)
Basophils Relative: 0 %
Eosinophils Absolute: 0 10*3/uL (ref 0.0–0.5)
Eosinophils Relative: 0 %
HCT: 22.9 % — ABNORMAL LOW (ref 36.0–46.0)
Hemoglobin: 7.5 g/dL — ABNORMAL LOW (ref 12.0–15.0)
Immature Granulocytes: 1 %
Lymphocytes Relative: 21 %
Lymphs Abs: 1.6 10*3/uL (ref 0.7–4.0)
MCH: 29.6 pg (ref 26.0–34.0)
MCHC: 32.8 g/dL (ref 30.0–36.0)
MCV: 90.5 fL (ref 80.0–100.0)
Monocytes Absolute: 1 10*3/uL (ref 0.1–1.0)
Monocytes Relative: 12 %
Neutro Abs: 5.1 10*3/uL (ref 1.7–7.7)
Neutrophils Relative %: 66 %
Platelets: 155 10*3/uL (ref 150–400)
RBC: 2.53 MIL/uL — ABNORMAL LOW (ref 3.87–5.11)
RDW: 15.2 % (ref 11.5–15.5)
WBC: 7.7 10*3/uL (ref 4.0–10.5)
nRBC: 0 % (ref 0.0–0.2)

## 2019-02-16 LAB — PROCALCITONIN: Procalcitonin: <0.1 ng/mL

## 2019-02-16 LAB — BRAIN NATRIURETIC PEPTIDE: B Natriuretic Peptide: 598.7 pg/mL — ABNORMAL HIGH (ref 0.0–100.0)

## 2019-02-16 LAB — OSMOLALITY, URINE: Osmolality, Ur: 348 mOsm/kg (ref 300–900)

## 2019-02-16 LAB — PROTIME-INR
INR: 1.2 (ref 0.8–1.2)
Prothrombin Time: 15.4 seconds — ABNORMAL HIGH (ref 11.4–15.2)

## 2019-02-16 LAB — ECHOCARDIOGRAM LIMITED
Height: 58 in
Weight: 1648 oz

## 2019-02-16 LAB — T4, FREE: Free T4: 1.14 ng/dL — ABNORMAL HIGH (ref 0.61–1.12)

## 2019-02-16 LAB — SARS CORONAVIRUS 2 BY RT PCR (HOSPITAL ORDER, PERFORMED IN ~~LOC~~ HOSPITAL LAB): SARS Coronavirus 2: NEGATIVE

## 2019-02-16 LAB — TSH: TSH: 1.781 u[IU]/mL (ref 0.350–4.500)

## 2019-02-16 LAB — PREPARE RBC (CROSSMATCH)

## 2019-02-16 LAB — APTT: aPTT: 31 seconds (ref 24–36)

## 2019-02-16 LAB — NA AND K (SODIUM & POTASSIUM), RAND UR
Potassium Urine: 18 mmol/L
Sodium, Ur: <10 mmol/L

## 2019-02-16 LAB — FIBRINOGEN: Fibrinogen: 437 mg/dL (ref 210–475)

## 2019-02-16 LAB — OSMOLALITY: Osmolality: 273 mOsm/kg — ABNORMAL LOW (ref 275–295)

## 2019-02-16 LAB — CREATININE, URINE, RANDOM: Creatinine, Urine: 89.64 mg/dL

## 2019-02-16 MED ORDER — ACETAMINOPHEN 650 MG RE SUPP: 650.0000 mg | Freq: Four times a day (QID) | RECTAL | Status: DC | PRN

## 2019-02-16 MED ORDER — FLUTICASONE PROPIONATE HFA 110 MCG/ACT IN AERO: 1.0000 | INHALATION_SPRAY | Freq: Two times a day (BID) | RESPIRATORY_TRACT | Status: DC

## 2019-02-16 MED ORDER — ENSURE ENLIVE PO LIQD
237.0000 mL | Freq: Two times a day (BID) | ORAL | Status: DC
  Administered 2019-02-17 – 2019-02-19 (×4): 237 mL via ORAL

## 2019-02-16 MED ORDER — ATORVASTATIN CALCIUM 10 MG PO TABS
20.0000 mg | ORAL_TABLET | Freq: Every day | ORAL | Status: DC
  Administered 2019-02-16 – 2019-02-19 (×4): 20 mg via ORAL
  Filled 2019-02-16 (×3): qty 2
  Filled 2019-02-16: qty 1

## 2019-02-16 MED ORDER — HYDROCODONE-ACETAMINOPHEN 5-325 MG PO TABS
1.0000 | ORAL_TABLET | Freq: Four times a day (QID) | ORAL | Status: DC | PRN
  Administered 2019-02-17 – 2019-02-19 (×3): 1 via ORAL
  Filled 2019-02-16 (×3): qty 1

## 2019-02-16 MED ORDER — FERROUS SULFATE 325 (65 FE) MG PO TABS
325.0000 mg | ORAL_TABLET | Freq: Three times a day (TID) | ORAL | Status: DC
  Filled 2019-02-16 (×3): qty 1

## 2019-02-16 MED ORDER — BISACODYL 10 MG RE SUPP
10.0000 mg | Freq: Every day | RECTAL | Status: DC | PRN
  Administered 2019-02-18: 10 mg via RECTAL
  Filled 2019-02-16: qty 1

## 2019-02-16 MED ORDER — FUROSEMIDE 10 MG/ML IJ SOLN
20.0000 mg | Freq: Once | INTRAMUSCULAR | Status: AC
  Administered 2019-02-16: 20 mg via INTRAVENOUS
  Filled 2019-02-16: qty 4

## 2019-02-16 MED ORDER — FAMOTIDINE 20 MG PO TABS
20.0000 mg | ORAL_TABLET | Freq: Every day | ORAL | Status: DC
  Administered 2019-02-16 – 2019-02-19 (×4): 20 mg via ORAL
  Filled 2019-02-16 (×4): qty 1

## 2019-02-16 MED ORDER — CITALOPRAM HYDROBROMIDE 20 MG PO TABS
20.0000 mg | ORAL_TABLET | Freq: Every day | ORAL | Status: DC
  Administered 2019-02-16 – 2019-02-18 (×3): 20 mg via ORAL
  Filled 2019-02-16 (×3): qty 1

## 2019-02-16 MED ORDER — POLYETHYLENE GLYCOL 3350 17 G PO PACK
17.0000 g | PACK | Freq: Two times a day (BID) | ORAL | Status: DC
  Administered 2019-02-16 – 2019-02-18 (×5): 17 g via ORAL
  Filled 2019-02-16 (×6): qty 1

## 2019-02-16 MED ORDER — ALBUTEROL SULFATE (2.5 MG/3ML) 0.083% IN NEBU: 3.0000 mL | INHALATION_SOLUTION | RESPIRATORY_TRACT | Status: DC | PRN

## 2019-02-16 MED ORDER — SODIUM CHLORIDE 0.9% FLUSH
3.0000 mL | Freq: Two times a day (BID) | INTRAVENOUS | Status: DC
  Administered 2019-02-16 – 2019-02-19 (×7): 3 mL via INTRAVENOUS

## 2019-02-16 MED ORDER — PRIMIDONE 50 MG PO TABS
50.0000 mg | ORAL_TABLET | Freq: Every day | ORAL | Status: DC
  Administered 2019-02-16 – 2019-02-18 (×3): 50 mg via ORAL
  Filled 2019-02-16 (×5): qty 1

## 2019-02-16 MED ORDER — METHOCARBAMOL 500 MG PO TABS
500.0000 mg | ORAL_TABLET | Freq: Four times a day (QID) | ORAL | Status: DC | PRN
  Administered 2019-02-16: 500 mg via ORAL
  Filled 2019-02-16: qty 1

## 2019-02-16 MED ORDER — ASPIRIN 81 MG PO CHEW
81.0000 mg | CHEWABLE_TABLET | Freq: Two times a day (BID) | ORAL | Status: DC
  Administered 2019-02-16 – 2019-02-19 (×7): 81 mg via ORAL
  Filled 2019-02-16 (×7): qty 1

## 2019-02-16 MED ORDER — ALBUMIN HUMAN 5 % IV SOLN
12.5000 g | Freq: Once | INTRAVENOUS | Status: AC
  Administered 2019-02-16: 12.5 g via INTRAVENOUS
  Filled 2019-02-16: qty 500

## 2019-02-16 MED ORDER — SODIUM CHLORIDE 0.9 % IV SOLN
500.0000 mg | INTRAVENOUS | Status: DC
  Administered 2019-02-16: 500 mg via INTRAVENOUS
  Filled 2019-02-16: qty 500

## 2019-02-16 MED ORDER — ACETAMINOPHEN 325 MG PO TABS
650.0000 mg | ORAL_TABLET | Freq: Four times a day (QID) | ORAL | Status: DC | PRN
  Administered 2019-02-17 – 2019-02-18 (×4): 650 mg via ORAL
  Filled 2019-02-16 (×4): qty 2

## 2019-02-16 MED ORDER — LEVOTHYROXINE SODIUM 50 MCG PO TABS
50.0000 ug | ORAL_TABLET | Freq: Every day | ORAL | Status: DC
  Administered 2019-02-16 – 2019-02-19 (×4): 50 ug via ORAL
  Filled 2019-02-16 (×4): qty 1

## 2019-02-16 MED ORDER — HEPARIN SODIUM (PORCINE) 5000 UNIT/ML IJ SOLN: 5000.0000 [IU] | Freq: Three times a day (TID) | INTRAMUSCULAR | Status: DC

## 2019-02-16 MED ORDER — SODIUM CHLORIDE 0.9% IV SOLUTION: Freq: Once | INTRAVENOUS | Status: DC

## 2019-02-16 MED ORDER — MOMETASONE FURO-FORMOTEROL FUM 200-5 MCG/ACT IN AERO
2.0000 | INHALATION_SPRAY | Freq: Two times a day (BID) | RESPIRATORY_TRACT | Status: DC
  Administered 2019-02-16 – 2019-02-19 (×7): 2 via RESPIRATORY_TRACT
  Filled 2019-02-16: qty 8.8

## 2019-02-16 MED ORDER — CHLORHEXIDINE GLUCONATE CLOTH 2 % EX PADS
6.0000 | MEDICATED_PAD | Freq: Every day | CUTANEOUS | Status: DC
  Administered 2019-02-16 – 2019-02-19 (×4): 6 via TOPICAL

## 2019-02-16 NOTE — ED Notes (Signed)
Pt sats dropped to 87% with a good waveform. She denies SOB. Placed on 2L [place] and saturations increased to 96%

## 2019-02-16 NOTE — ED Notes (Signed)
Family member requesting update. Admitting physician and ED physician made aware. Will continue to monitor patient.

## 2019-02-16 NOTE — ED Notes (Signed)
EDP at bedside to provide update. Will continue to monitor patient.

## 2019-02-16 NOTE — ED Notes (Signed)
Only able to obtain one set of blood cultures prior to starting IV antibiotics due to patient being a difficult stick.

## 2019-02-16 NOTE — ED Notes (Signed)
Patient family member requesting update. EDP made aware. Will continue to monitor patient.

## 2019-02-16 NOTE — Progress Notes (Signed)
2D limited echo performed.

## 2019-02-16 NOTE — ED Notes (Signed)
Obtained partial lab orders. Will ask Lab phlebotomy to collect.

## 2019-02-16 NOTE — Progress Notes (Signed)
Bilateral lower extremity venous duplex has been completed. Preliminary results can be found in CV Proc through chart review.   02/16/19 9:35 AM Julie Wilkerson RVT

## 2019-02-16 NOTE — H&P (Signed)
[facility] History and Physical   Patient: Julie Wilkerson [id number]   PCP: [name], MD DOB: 27-Apr-1941   DOA: 02/15/2019   DOS: 02/16/2019   DOS: the patient was seen and examined on 02/16/2019  Patient coming from: The patient is coming from Home  Chief Complaint: shortness of breath   HPI: Nasia Bryn is a 78 y.o. female with Past medical history of arthritis S/P recent hip arthroplasty, history of breast cancer, anxiety, asthma, HLD, osteoporosis, depression. Patient presents with complaints of shortness of breath cough and fatigue.  Found to have hypotension in the emergency department. Now has some cough. Reports some redness at the surgical site as well as swelling of the left leg. Also reports some back pain which is bilateral and associated in the middle of the back and continuous in nature. Had some fever at home. Reports constipation.  But passing gas.  No active bleeding reported.  ED Course: Found hypotensive, tachycardic.  CT chest negative for PE but shows groundglass opacities.  Coronavirus test negative.  Patient was referred for admission for sepsis due to pneumonia.  At her baseline ambulate with supports Is independent for most of her ADL;  Does manages her medication on her own.  Review of Systems: as mentioned in the history of present illness.  All other systems reviewed and are negative.  Past Medical History:  Diagnosis Date   Anemia    Anginal pain ([place])    Anxiety    Arthritis    Asthma    Breast cancer ([place])    2002, left, encapsulated, microcalcifications. lumpectomy, radtiation x 30   Breast cancer in female [facility])    Change in mole 06/08/2016   Colitis    Complication of anesthesia    VERY SENSITIVE TO ANESTHESIA    Decreased visual acuity 01/16/2017   Depression    Dyspnea 11/04/2016   with asthma exacerbation only   Dysuria 11/04/2016   Family history of adverse reaction to anesthesia    Gluten  intolerance    History of chicken pox    History of Helicobacter pylori infection 08/01/2015   Hyperlipidemia    Hypertension    Hypothyroid    IBS (irritable bowel syndrome)    Left leg pain 03/01/2017   Low back pain 08/10/2015   Neck pain 03/01/2017   Osteoporosis    Personal history of radiation therapy    Pneumonia    PONV (postoperative nausea and vomiting)    RLQ discomfort 03/01/2017   Stomach cramps    Urinary tract infection 11/04/2016   Past Surgical History:  Procedure Laterality Date   ABDOMINAL HYSTERECTOMY     partial   APPENDECTOMY     BREAST LUMPECTOMY Left 2002   CHOLECYSTECTOMY  Age 48 or 16   COLONOSCOPY  2017   EYE SURGERY Left    cataract   LEFT HEART CATH AND CORONARY ANGIOGRAPHY N/A 02/09/2019   Procedure: LEFT HEART CATH AND CORONARY ANGIOGRAPHY;  Surgeon: Nelva Bush, MD;  Location: [facility];  Service: Cardiovascular;  Laterality: N/A;   TONSILLECTOMY     TOTAL HIP ARTHROPLASTY Left 02/13/2019   Procedure: TOTAL HIP ARTHROPLASTY ANTERIOR APPROACH;  Surgeon: Paralee Cancel, MD;  Location: [facility];  Service: Orthopedics;  Laterality: Left;  70 mins   Social History:  reports that she has never smoked. She has never used smokeless tobacco. She reports that she does not drink alcohol or use drugs.  No Known Allergies  Family  history reviewed and not pertinent Family History  Problem Relation Age of Onset   Colitis Mother    Irritable bowel syndrome Mother    Hypertension Father    Hyperlipidemia Brother    Heart attack Brother    Stomach cancer Paternal Grandmother 27   Hyperlipidemia Brother    Heart attack Brother    Hyperlipidemia Brother    Heart attack Brother    Hyperlipidemia Sister    Leukemia Daughter    Arthritis Daughter    Heart disease Daughter        ASD vs VSD   Colon cancer Neg Hx      Prior to Admission medications   Medication Sig Start Date End Date Taking? Authorizing  Provider  aspirin (ASPIRIN CHILDRENS) 81 MG chewable tablet Chew 1 tablet (81 mg total) by mouth 2 (two) times daily. Take for 4 weeks, then resume regular dose. 02/14/19 03/16/19 Yes Babish, Rodman Key, PA-C  atorvastatin (LIPITOR) 20 MG tablet Take 1 tablet (20 mg total) by mouth daily. 02/12/19 05/13/19 Yes Tobb, Kardie, DO  budesonide-formoterol (SYMBICORT) 160-4.5 MCG/ACT inhaler Inhale 2 puffs into the lungs 2 (two) times daily as needed (wheezing).   Yes [provider]  citalopram (CELEXA) 20 MG tablet TAKE 1  TABLET BY MOUTH ONCE DAILY Patient taking differently: Take 20 mg by mouth at bedtime.  09/04/18  Yes [name], MD  docusate sodium (COLACE) 100 MG capsule Take 1 capsule (100 mg total) by mouth 2 (two) times daily. 02/14/19  Yes Babish, Rodman Key, PA-C  famotidine (PEPCID) 20 MG tablet Take 1 tablet (20 mg total) by mouth 2 (two) times daily. Patient taking differently: Take 20 mg by mouth daily.  11/22/18  Yes Armbruster, Carlota Raspberry, MD  ferrous sulfate (FERROUSUL) 325 (65 FE) MG tablet Take 1 tablet (325 mg total) by mouth 3 (three) times daily with meals for 14 days. 02/14/19 02/28/19 Yes Babish, Rodman Key, PA-C  fluticasone (FLOVENT HFA) 110 MCG/ACT inhaler Inhale 1 puff into the lungs 2 (two) times daily. Patient taking differently: Inhale 1 puff into the lungs 2 (two) times daily as needed (shortness of breath or wheezing).  10/22/18  Yes [name], MD  HYDROcodone-acetaminophen (NORCO) 7.5-325 MG tablet Take 1-2 tablets by mouth every 8 (eight) hours as needed for moderate pain. 02/14/19  Yes Babish, Rodman Key, PA-C  hyoscyamine (LEVSIN SL) 0.125 MG SL tablet Place 1 tablet (0.125 mg total) under the tongue every 4 (four) hours as needed for cramping. 09/04/18  Yes [name], MD  levothyroxine (SYNTHROID, LEVOTHROID) 50 MCG tablet Take 1 tablet (50 mcg total) by mouth daily. 09/04/18  Yes [name], MD  lisinopril (PRINIVIL,ZESTRIL) 20 MG tablet Take 1 tablet (20 mg  total) by mouth daily. Patient taking differently: Take 20 mg by mouth at bedtime.  09/04/18  Yes [name], MD  methocarbamol (ROBAXIN) 500 MG tablet Take 1 tablet (500 mg total) by mouth every 6 (six) hours as needed for muscle spasms. 02/14/19  Yes Babish, Rodman Key, PA-C  metoprolol succinate (TOPROL-XL) 25 MG 24 hr tablet Take 1 tablet (25 mg total) by mouth daily. Patient taking differently: Take 25 mg by mouth at bedtime.  09/04/18  Yes [name], MD  omega-3 acid ethyl esters (LOVAZA) 1 g capsule Take 2 g by mouth daily.   Yes [provider]  polyethylene glycol (MIRALAX / GLYCOLAX) 17 g packet Take 17 g by mouth 2 (two) times daily. 02/14/19  Yes Danae Orleans,  PA-C  primidone (MYSOLINE) 50 MG tablet Take 1 tablet (50 mg total) by mouth at bedtime. 09/19/18  Yes Tat, Eustace Quail, DO  Probiotic Product (PROBIOTIC DAILY PO) Take 1 tablet by mouth daily.    Yes [provider]  sucralfate (CARAFATE) 1 g tablet Take 1 tablet (1 g total) by mouth every 6 (six) hours as needed. Patient taking differently: Take 1 g by mouth every 6 (six) hours as needed. Stomach ulcers 11/22/18  Yes Armbruster, Carlota Raspberry, MD    Physical Exam: Vitals:   02/16/19 1600 02/16/19 1630 02/16/19 1700 02/16/19 1809  BP: (!) 103/40 (!) 103/40 (!) 89/39 (!) 105/44  Pulse: 75 71 74 72  Resp: 16 19 20  (!) 21  Temp:  98.1 F (36.7 C)    TempSrc:  Oral    SpO2: 95% 96% 95% 96%  Weight:      Height:        General: alert and oriented to time, place, and person. Appear in moderate distress, affect appropriate Eyes: PERRL, Conjunctiva normal ENT: Oral Mucosa Clear, moist  Neck: positive JVD, no Abnormal Mass Or lumps Cardiovascular: S1 and S2 Present, no Murmur, peripheral pulses symmetrical Respiratory: good respiratory effort, Bilateral Air entry equal and Decreased, no signs of accessory muscle use, bilateral  Crackles, no wheezes Abdomen: Bowel Sound present, Soft and no tenderness, no  hernia Skin: no rashes  Extremities: bilateral left>right Pedal edema, no calf tenderness Neurologic: without any new focal findings Gait not checked due to patient safety concerns  Data Reviewed: I have personally reviewed and interpreted labs, imaging as discussed below.  CBC: Recent Labs  Lab 02/14/19 0251 02/15/19 2056 02/16/19 0759  WBC 10.0 12.2* 7.7  NEUTROABS  --  8.6* 5.1  HGB 9.1* 8.9* 7.5*  HCT 28.5* 27.1* 22.9*  MCV 90.2 90.6 90.5  PLT 180 156 [id number]   Basic Metabolic Panel: Recent Labs  Lab 02/14/19 0251 02/15/19 2056 02/16/19 0759  NA 130* 128* 132*  K 4.8 3.9 4.2  CL 100 98 105  CO2 19* 21* 20*  GLUCOSE 138* 121* 92  BUN 20 31* 24*  CREATININE 1.08* 1.13* 1.00  CALCIUM 7.5* 8.1* 7.6*   GFR: Estimated Creatinine Clearance: 30.4 mL/min (by C-G formula based on SCr of 1 mg/dL). Liver Function Tests: Recent Labs  Lab 02/15/19 2056 02/16/19 0759  AST 51* 40  ALT 15 12  ALKPHOS 58 51  BILITOT 0.7 1.0  PROT 6.5 5.4*  ALBUMIN 3.6 2.9*   No results for input(s): LIPASE, AMYLASE in the last 168 hours. No results for input(s): AMMONIA in the last 168 hours. Coagulation Profile: Recent Labs  Lab 02/16/19 0759  INR 1.2   Cardiac Enzymes: No results for input(s): CKTOTAL, CKMB, CKMBINDEX, TROPONINI in the last 168 hours. BNP (last 3 results) No results for input(s): PROBNP in the last 8760 hours. HbA1C: No results for input(s): HGBA1C in the last 72 hours. CBG: No results for input(s): GLUCAP in the last 168 hours. Lipid Profile: No results for input(s): CHOL, HDL, LDLCALC, TRIG, CHOLHDL, LDLDIRECT in the last 72 hours. Thyroid Function Tests: Recent Labs    02/16/19 0800  TSH 1.781  FREET4 1.14*   Anemia Panel: No results for input(s): VITAMINB12, FOLATE, FERRITIN, TIBC, IRON, RETICCTPCT in the last 72 hours. Urine analysis:    Component Value Date/Time   COLORURINE STRAW (A) 02/15/2019 2142   APPEARANCEUR CLEAR 02/15/2019 2142    LABSPEC 1.011 02/15/2019 2142   PHURINE 5.0 02/15/2019 2142  GLUCOSEU NEGATIVE 02/15/2019 2142   GLUCOSEU NEGATIVE 07/17/2018 [place] 02/15/2019 2142   BILIRUBINUR NEGATIVE 02/15/2019 2142   BILIRUBINUR neg 12/12/2018 [place] 02/15/2019 2142   PROTEINUR NEGATIVE 02/15/2019 2142   UROBILINOGEN 0.2 12/12/2018 1619   UROBILINOGEN 0.2 07/17/2018 1539   NITRITE NEGATIVE 02/15/2019 2142   LEUKOCYTESUR NEGATIVE 02/15/2019 2142    Radiological Exams on Admission: Dg Chest 2 View  Result Date: 02/15/2019 CLINICAL DATA:  Shortness of breath EXAM: CHEST - 2 VIEW COMPARISON:  June 13, 2018 FINDINGS: The mediastinal contour and cardiac silhouette are normal. Mild linear opacities are identified in bilateral mid lung and lung bases probably due to atelectasis or focal scar. There is no focal infiltrate, pulmonary edema, or pleural effusion. The bony structures are stable. IMPRESSION: Mild linear opacities identified in bilateral lungs either due to linear atelectasis or scar. No focal pneumonia is identified. Electronically Signed   By: Abelardo Diesel M.D.   On: 02/15/2019 21:19   Ct Head Wo Contrast  Result Date: 02/15/2019 CLINICAL DATA:  78 year old female with headache. EXAM: CT HEAD WITHOUT CONTRAST TECHNIQUE: Contiguous axial images were obtained from the base of the skull through the vertex without intravenous contrast. COMPARISON:  Head CT dated 03/29/2017 FINDINGS: Brain: Mild age-related atrophy and chronic microvascular ischemic changes. There is no acute intracranial hemorrhage. No mass effect or midline shift. No extra-axial fluid collection. Vascular: No hyperdense vessel or unexpected calcification. Skull: Normal. Negative for fracture or focal lesion. Sinuses/Orbits: No acute finding. Other: None IMPRESSION: 1. No acute intracranial hemorrhage. 2. Age-related atrophy and chronic microvascular ischemic changes. Electronically Signed   By: Anner Crete  M.D.   On: 02/15/2019 23:33   Ct Angio Chest Pe W And/or Wo Contrast  Result Date: 02/15/2019 CLINICAL DATA:  Shortness of breath, chills. EXAM: CT ANGIOGRAPHY CHEST WITH CONTRAST TECHNIQUE: Multidetector CT imaging of the chest was performed using the standard protocol during bolus administration of intravenous contrast. Multiplanar CT image reconstructions and MIPs were obtained to evaluate the vascular anatomy. CONTRAST:  50mL OMNIPAQUE IOHEXOL 350 MG/ML SOLN COMPARISON:  None. FINDINGS: Cardiovascular: No filling defects in the pulmonary arteries to suggest pulmonary emboli. Heart is normal size. Aorta is normal caliber with scattered aortic calcifications. Mediastinum/Nodes: Small hiatal hernia. No mediastinal, hilar, or axillary adenopathy. Lungs/Pleura: Ground-glass opacities within the upper lobes bilaterally. Linear areas of scarring or atelectasis in both mid and lower lungs. Trace bilateral pleural effusions. Upper Abdomen: Imaging into the upper abdomen shows no acute findings. Musculoskeletal: Chest wall soft tissues are unremarkable. No acute bony abnormality. Review of the MIP images confirms the above findings. IMPRESSION: No evidence of pulmonary embolus. Ground-glass opacities in the upper lobes bilaterally could reflect areas of atelectasis, edema or infection. Linear densities in the mid and lower lungs could reflect atelectasis or scarring. Trace bilateral effusions. Small hiatal hernia. Electronically Signed   By: Rolm Baptise M.D.   On: 02/15/2019 23:30   Vas [name] Lower Extremity Venous (dvt)  Result Date: 02/16/2019  Lower Venous Study Indications: Edema.  Risk Factors: Surgery 02/13/2019 - LEFT TOTAL HIP ARTHROPLASTY ANTERIOR APPROACH. Limitations: Poor ultrasound/tissue interface and patient positioning. Comparison Study: No prior studies. Performing Technologist: Oliver Hum RVT  Examination Guidelines: A complete evaluation includes B-mode imaging, spectral Doppler, color  Doppler, and power Doppler as needed of all accessible portions of each vessel. Bilateral testing is considered an integral part of a complete examination. Limited examinations for reoccurring indications may be performed  as noted.  +---------+---------------+---------+-----------+----------+--------------+  RIGHT     Compressibility Phasicity Spontaneity Properties Thrombus Aging  +---------+---------------+---------+-----------+----------+--------------+  CFV       Full            Yes       Yes                                    +---------+---------------+---------+-----------+----------+--------------+  SFJ       Full                                                             +---------+---------------+---------+-----------+----------+--------------+  FV Prox   Full                                                             +---------+---------------+---------+-----------+----------+--------------+  FV Mid    Full                                                             +---------+---------------+---------+-----------+----------+--------------+  FV Distal Full                                                             +---------+---------------+---------+-----------+----------+--------------+  PFV       Full                                                             +---------+---------------+---------+-----------+----------+--------------+  POP       Full            Yes       Yes                                    +---------+---------------+---------+-----------+----------+--------------+  PTV       Full                                                             +---------+---------------+---------+-----------+----------+--------------+  PERO      Full                                                             +---------+---------------+---------+-----------+----------+--------------+   +---------+---------------+---------+-----------+----------+--------------+  LEFT       Compressibility Phasicity Spontaneity Properties Thrombus Aging  +---------+---------------+---------+-----------+----------+--------------+  CFV       Full            Yes       Yes                                    +---------+---------------+---------+-----------+----------+--------------+  SFJ       Full                                                             +---------+---------------+---------+-----------+----------+--------------+  FV Prox   Full                                                             +---------+---------------+---------+-----------+----------+--------------+  FV Mid    Full                                                             +---------+---------------+---------+-----------+----------+--------------+  FV Distal                 Yes       Yes                                    +---------+---------------+---------+-----------+----------+--------------+  PFV       Full                                                             +---------+---------------+---------+-----------+----------+--------------+  POP       Full            Yes       Yes                                    +---------+---------------+---------+-----------+----------+--------------+  PTV       Full                                                             +---------+---------------+---------+-----------+----------+--------------+  PERO      Full                                                             +---------+---------------+---------+-----------+----------+--------------+  Summary: Right: There is no evidence of deep vein thrombosis in the lower extremity. No cystic structure found in the popliteal fossa. Left: There is no evidence of deep vein thrombosis in the lower extremity. However, portions of this examination were limited- see technologist comments above. No cystic structure found in the popliteal fossa.  *See table(s) above for measurements and observations.    Preliminary    EKG:  Independently reviewed. normal sinus rhythm, nonspecific ST and T waves changes. Echocardiogram: Left ventricular ejection fraction, by visual estimation, is 60 to 65%. The left ventricle has normal function. There is no left ventricular hypertrophy. Global right ventricle has normal systolic function  I reviewed all nursing notes, pharmacy notes, vitals, pertinent old records.  Assessment/Plan 1.  Sirs Sepsis ruled out. Presents with complaints of cough and shortness of breath. Initial concern was sepsis. CT scan was performed which showed evidence of groundglass opacities. Procalcitonin level negative lactic acid level negative. Patient actually severely anemic as well as hypotensive and hypoxic which is likely the cause of patient's SIRS. We will continue to hold off on antibiotic.  Low threshold to resume the antibiotic. Monitor cultures.  2.  Acute hypoxic respiratory failure Acute blood loss anemia Acute diastolic CHF Hypotension Essential hypertension Resents with cough and shortness of breath. Hypoxic to 88% on room air.  Intermittently requiring 2 L of oxygen. Bilateral crackles at lung exam, elevated BNP, CT shows evidence of groundglass opacities. Coronavirus test negative. Echocardiogram showed preserved EF but diastolic dysfunction. Suspect this is secondary to volume overload plus combination of acute anemia and new CHF. Acute anemia likely secondary to acute blood loss blood Repeat H&H relatively stable but still low. We will transfuse 1 PRBC and follow-up with Lasix. Continue to monitor on the stepdown unit. Currently holding blood pressure medication. Albumin support.  3.  Recent hip fracture. Orthopedic surgery. Recommend conservative management.  No concern for hematoma at the surgical site. Lower extremity Doppler negative for DVT.   4.  Hyponatremia. Sodium level improving. Likely secondary to volume overload. Osmolarity low. Consistent with  hyperkalemia. Will benefit from Lasix but currently fluid restricted diet.  Nutrition: Cardiac diet DVT Prophylaxis: Subcutaneous Lovenox  Advance goals of care discussion: Full code   Consults: I personally Discussed with orthopedics   Family Communication: family was present at bedside, at the time of interview.  Opportunity was given to ask question and all questions were answered satisfactorily.  Disposition: Admitted as inpatient, step-down unit. Likely to be discharged home, in 2-3 days.  I have discussed plan of care as described above with RN and patient/family.  Severity of Illness: The appropriate patient status for this patient is INPATIENT. Inpatient status is judged to be reasonable and necessary in order to provide the required intensity of service to ensure the patient's safety. The patient's presenting symptoms, physical exam findings, and initial radiographic and laboratory data in the context of their chronic comorbidities is felt to place them at high risk for further clinical deterioration. Furthermore, it is not anticipated that the patient will be medically stable for discharge from the hospital within 2 midnights of admission. The following factors support the patient status of inpatient.   " The patient's presenting symptoms include shortness of breath . " The worrisome physical exam findings include hypoxia, hypotension, fever. " The initial radiographic and laboratory data are worrisome because of ground glass opacity on lung CT. " The chronic co-morbidities include CAD, hypothyroidism, HTN.   * I certify that at the  point of admission it is my clinical judgment that the patient will require inpatient hospital care spanning beyond 2 midnights from the point of admission due to high intensity of service, high risk for further deterioration and high frequency of surveillance required.*    Author: Berle Mull, MD [facility] 02/16/2019 6:49 PM   To reach  On-call, see care teams to locate the attending and reach out to them via [email or web address]. If 7PM-7AM, please contact night-coverage If you still have difficulty reaching the attending provider, please page the [facility] (Director on Call) for [facility] on amion for assistance.

## 2019-02-16 NOTE — ED Notes (Signed)
EDP made aware of patient's BP. Will continue to monitor patient.

## 2019-02-17 ENCOUNTER — Inpatient Hospital Stay (HOSPITAL_COMMUNITY): Payer: Medicare HMO

## 2019-02-17 LAB — BPAM RBC
Blood Product Expiration Date: 202010192359
ISSUE DATE / TIME: 202009251231
Unit Type and Rh: 6200

## 2019-02-17 LAB — TYPE AND SCREEN
ABO/RH(D): A POS
Antibody Screen: NEGATIVE
Unit division: 0

## 2019-02-17 LAB — CBC WITH DIFFERENTIAL/PLATELET
Abs Immature Granulocytes: 0.11 10*3/uL — ABNORMAL HIGH (ref 0.00–0.07)
Basophils Absolute: 0 10*3/uL (ref 0.0–0.1)
Basophils Relative: 0 %
Eosinophils Absolute: 0 10*3/uL (ref 0.0–0.5)
Eosinophils Relative: 0 %
HCT: 27 % — ABNORMAL LOW (ref 36.0–46.0)
Hemoglobin: 8.8 g/dL — ABNORMAL LOW (ref 12.0–15.0)
Immature Granulocytes: 2 %
Lymphocytes Relative: 21 %
Lymphs Abs: 1.6 10*3/uL (ref 0.7–4.0)
MCH: 29.5 pg (ref 26.0–34.0)
MCHC: 32.6 g/dL (ref 30.0–36.0)
MCV: 90.6 fL (ref 80.0–100.0)
Monocytes Absolute: 1.1 10*3/uL — ABNORMAL HIGH (ref 0.1–1.0)
Monocytes Relative: 14 %
Neutro Abs: 4.8 10*3/uL (ref 1.7–7.7)
Neutrophils Relative %: 63 %
Platelets: 147 10*3/uL — ABNORMAL LOW (ref 150–400)
RBC: 2.98 MIL/uL — ABNORMAL LOW (ref 3.87–5.11)
RDW: 15.4 % (ref 11.5–15.5)
WBC: 7.6 10*3/uL (ref 4.0–10.5)
nRBC: 0 % (ref 0.0–0.2)

## 2019-02-17 LAB — COMPREHENSIVE METABOLIC PANEL
ALT: 14 U/L (ref 0–44)
AST: 31 U/L (ref 15–41)
Albumin: 3.1 g/dL — ABNORMAL LOW (ref 3.5–5.0)
Alkaline Phosphatase: 51 U/L (ref 38–126)
Anion gap: 5 (ref 5–15)
BUN: 22 mg/dL (ref 8–23)
CO2: 23 mmol/L (ref 22–32)
Calcium: 7.5 mg/dL — ABNORMAL LOW (ref 8.9–10.3)
Chloride: 105 mmol/L (ref 98–111)
Creatinine, Ser: 1 mg/dL (ref 0.44–1.00)
GFR calc Af Amer: >60 mL/min (ref >60)
GFR calc non Af Amer: 54 mL/min — ABNORMAL LOW (ref >60)
Glucose, Bld: 110 mg/dL — ABNORMAL HIGH (ref 70–99)
Potassium: 4.1 mmol/L (ref 3.5–5.1)
Sodium: 133 mmol/L — ABNORMAL LOW (ref 135–145)
Total Bilirubin: 1 mg/dL (ref 0.3–1.2)
Total Protein: 5.6 g/dL — ABNORMAL LOW (ref 6.5–8.1)

## 2019-02-17 LAB — MAGNESIUM: Magnesium: 2 mg/dL (ref 1.7–2.4)

## 2019-02-17 LAB — LACTIC ACID, PLASMA: Lactic Acid, Venous: 0.8 mmol/L (ref 0.5–1.9)

## 2019-02-17 MED ORDER — HEPARIN SODIUM (PORCINE) 5000 UNIT/ML IJ SOLN
5000.0000 [IU] | Freq: Three times a day (TID) | INTRAMUSCULAR | Status: DC
  Administered 2019-02-18 – 2019-02-19 (×4): 5000 [IU] via SUBCUTANEOUS
  Filled 2019-02-17 (×4): qty 1

## 2019-02-17 MED ORDER — SIMETHICONE 80 MG PO CHEW
80.0000 mg | CHEWABLE_TABLET | Freq: Four times a day (QID) | ORAL | Status: DC
  Administered 2019-02-17 – 2019-02-19 (×6): 80 mg via ORAL
  Filled 2019-02-17 (×6): qty 1

## 2019-02-17 MED ORDER — SIMETHICONE 80 MG PO CHEW
80.0000 mg | CHEWABLE_TABLET | Freq: Once | ORAL | Status: AC
  Administered 2019-02-17: 80 mg via ORAL
  Filled 2019-02-17: qty 1

## 2019-02-17 MED ORDER — SENNOSIDES-DOCUSATE SODIUM 8.6-50 MG PO TABS
2.0000 | ORAL_TABLET | Freq: Two times a day (BID) | ORAL | Status: DC
  Administered 2019-02-17 – 2019-02-18 (×3): 2 via ORAL
  Filled 2019-02-17 (×4): qty 2

## 2019-02-17 MED ORDER — FUROSEMIDE 10 MG/ML IJ SOLN
20.0000 mg | Freq: Once | INTRAMUSCULAR | Status: AC
  Administered 2019-02-17: 20 mg via INTRAVENOUS
  Filled 2019-02-17: qty 2

## 2019-02-17 NOTE — Progress Notes (Signed)
[facility] Progress Note  Patient: Julie Wilkerson [id number]   PCP: [name], MD DOB: [birth year]   DOA: 02/15/2019   DOS: 02/17/2019   Date of Service: the patient was seen and examined on 02/17/2019  Chief Complaint  Patient presents with  . Shortness of Breath  . Hypotension  . Headache   Brief hospital course:  Julie Wilkerson is a 78 y.o. female with Past medical history of arthritis S/P recent hip arthroplasty, history of breast cancer, anxiety, asthma, HLD, osteoporosis, depression. Patient presents with complaints of shortness of breath cough and fatigue.  Found to have hypotension in the emergency department. Now has some cough. Reports some redness at the surgical site as well as swelling of the left leg. Also reports some back pain which is bilateral and associated in the middle of the back and continuous in nature. Had some fever at home. Reports constipation.  But passing gas.  No active bleeding reported.  Currently further plan is continue diuresis.  Subjective: Continues to have cough and shortness of breath.  No nausea no vomiting no fever no chills.  No chest pain no abdominal pain.  No diarrhea no constipation.  Assessment and Plan: 1.  Sirs Sepsis ruled out. Presents with complaints of cough and shortness of breath. Initial concern was sepsis. CT scan was performed which showed evidence of groundglass opacities. Procalcitonin level negative lactic acid level negative. Patient actually severely anemic as well as hypotensive and hypoxic which is likely the cause of patient's SIRS. We will continue to hold off on antibiotic.  Low threshold to resume the antibiotic. Monitor cultures.  2.  Acute hypoxic respiratory failure Acute blood loss anemia Acute diastolic CHF Hypotension Essential hypertension Resents with cough and shortness of breath. Hypoxic to 88% on room air.  Intermittently requiring 2 L of oxygen. Bilateral crackles at lung  exam, elevated BNP, CT shows evidence of groundglass opacities. Coronavirus test negative. Echocardiogram showed preserved EF but diastolic dysfunction. Suspect this is secondary to volume overload plus combination of acute anemia and new CHF. Acute anemia likely secondary to acute blood loss blood Repeat H&H relatively stable but still low. We will transfuse 1 PRBC and follow-up with Lasix. Continue to monitor on the stepdown unit. Currently holding blood pressure medication. Albumin support.  3.  Recent hip fracture. Orthopedic surgery. Recommend conservative management.  No concern for hematoma at the surgical site. Lower extremity Doppler negative for DVT.   4.  Hyponatremia. Sodium level improving. Likely secondary to volume overload. Osmolarity low. Consistent with hyperkalemia. Will benefit from Lasix but currently fluid restricted diet.  Diet: Cardiac diet  DVT Prophylaxis: Subcutaneous Heparin   Advance goals of care discussion: Full code  Family Communication: no family was present at bedside, at the time of interview.  Disposition:  Discharge to be determined .  Consultants: ortho  Procedures: nonoe  Scheduled Meds: . sodium chloride   Intravenous Once  . aspirin  81 mg Oral BID  . atorvastatin  20 mg Oral Daily  . Chlorhexidine Gluconate Cloth  6 each Topical Daily  . citalopram  20 mg Oral QHS  . famotidine  20 mg Oral Daily  . feeding supplement (ENSURE ENLIVE)  237 mL Oral BID BM  . ferrous sulfate  325 mg Oral TID WC  . levothyroxine  50 mcg Oral Q0600  . mometasone-formoterol  2 puff Inhalation BID  . polyethylene glycol  17 g Oral BID  . primidone  50 mg Oral QHS  .  senna-docusate  2 tablet Oral BID  . simethicone  80 mg Oral QID  . sodium chloride flush  3 mL Intravenous Q12H   Continuous Infusions: PRN Meds: acetaminophen **OR** acetaminophen, albuterol, bisacodyl, HYDROcodone-acetaminophen, methocarbamol Antibiotics: Anti-infectives (From  admission, onward)   Start     Dose/Rate Route Frequency Ordered Stop   02/16/19 0800  azithromycin (ZITHROMAX) 500 mg in sodium chloride 0.9 % 250 mL IVPB  Status:  Discontinued     500 mg 250 mL/hr over 60 Minutes Intravenous Every 24 hours 02/16/19 0723 02/16/19 1150   02/16/19 0000  vancomycin (VANCOCIN) IVPB 1000 mg/200 mL premix     1,000 mg 200 mL/hr over 60 Minutes Intravenous  Once 02/15/19 2348 02/16/19 0241   02/16/19 0000  ceFEPIme (MAXIPIME) 2 g in sodium chloride 0.9 % 100 mL IVPB     2 g 200 mL/hr over 30 Minutes Intravenous  Once 02/15/19 2348 02/16/19 0136       Objective: Physical Exam: Vitals:   02/17/19 1900 02/17/19 1930 02/17/19 2000 02/17/19 2005  BP: (!) 104/55  (!) 118/50   Pulse: 66 68 61   Resp: (!) 22 (!) 26 (!) 22   Temp:    98 F (36.7 C)  TempSrc:    Oral  SpO2: 96% 96% 95%   Weight:      Height:        Intake/Output Summary (Last 24 hours) at 02/17/2019 2037 Last data filed at 02/17/2019 1050 Gross per 24 hour  Intake 3 ml  Output -  Net 3 ml   Filed Weights   02/15/19 1858 02/17/19 0500  Weight: 46.7 kg 66.3 kg   General: alert and oriented to time, place, and person. Appear in mild distress, affect appropriate Eyes: PERRL, Conjunctiva normal ENT: Oral Mucosa Clear, moist  Neck: no JVD, no Abnormal Mass Or lumps Cardiovascular: S1 and S2 Present, no Murmur, peripheral pulses symmetrical Respiratory: good respiratory effort, Bilateral Air entry equal and Decreased, no signs of accessory muscle use, bilateral  Crackles, no wheezes Abdomen: Bowel Sound present, Soft and no tenderness, no hernia Skin: no rashes  Extremities: bilateral  Pedal edema, no calf tenderness Neurologic: without any new focal findings Gait not checked due to patient safety concerns  Data Reviewed: I have personally reviewed and interpreted daily labs, tele strips, imagings as discussed above. I reviewed all nursing notes, pharmacy notes, vitals, pertinent old  records I have discussed plan of care as described above with RN and patient/family.  CBC: Recent Labs  Lab 02/14/19 0251 02/15/19 2056 02/16/19 0759 02/17/19 0202  WBC 10.0 12.2* 7.7 7.6  NEUTROABS  --  8.6* 5.1 4.8  HGB 9.1* 8.9* 7.5* 8.8*  HCT 28.5* 27.1* 22.9* 27.0*  MCV 90.2 90.6 90.5 90.6  PLT 180 156 155 [id number]*   Basic Metabolic Panel: Recent Labs  Lab 02/14/19 0251 02/15/19 2056 02/16/19 0759 02/17/19 0202  NA 130* 128* 132* 133*  K 4.8 3.9 4.2 4.1  CL 100 98 105 105  CO2 19* 21* 20* 23  GLUCOSE 138* 121* 92 110*  BUN 20 31* 24* 22  CREATININE 1.08* 1.13* 1.00 1.00  CALCIUM 7.5* 8.1* 7.6* 7.5*  MG  --   --   --  2.0    Liver Function Tests: Recent Labs  Lab 02/15/19 2056 02/16/19 0759 02/17/19 0202  AST 51* 40 31  ALT 15 12 14   ALKPHOS 58 51 51  BILITOT 0.7 1.0 1.0  PROT 6.5 5.4* 5.6*  ALBUMIN  3.6 2.9* 3.1*   No results for input(s): LIPASE, AMYLASE in the last 168 hours. No results for input(s): AMMONIA in the last 168 hours. Coagulation Profile: Recent Labs  Lab 02/16/19 0759  INR 1.2   Cardiac Enzymes: No results for input(s): CKTOTAL, CKMB, CKMBINDEX, TROPONINI in the last 168 hours. BNP (last 3 results) No results for input(s): PROBNP in the last 8760 hours. CBG: No results for input(s): GLUCAP in the last 168 hours. Studies: Dg Chest Port 1 View  Result Date: 02/17/2019 CLINICAL DATA:  Cough and shortness of breath. EXAM: PORTABLE CHEST 1 VIEW COMPARISON:  02/15/2019 FINDINGS: 0917 hours. Low lung volumes. The cardio pericardial silhouette is enlarged. Basilar atelectasis and/or scarring is again noted, similar to prior. No focal airspace consolidation or pulmonary edema. No pleural effusion. The visualized bony structures of the thorax are intact. Telemetry leads overlie the chest. IMPRESSION: No active disease. Electronically Signed   By: Misty Stanley M.D.   On: 02/17/2019 11:49   Dg Abd Portable 1v  Result Date: 02/17/2019 CLINICAL  DATA:  Abdominal pain. EXAM: PORTABLE ABDOMEN - 1 VIEW COMPARISON:  None. FINDINGS: No gaseous small bowel dilatation to suggest obstruction. Moderate stool volume seen along the length of the colon. Convex leftward lumbar scoliosis evident. Patient is status post left hip replacement. IMPRESSION: Moderate stool volume. Electronically Signed   By: Misty Stanley M.D.   On: 02/17/2019 11:47     Time spent: 35 minutes  Author: Berle Mull, MD [facility] 02/17/2019 8:37 PM  To reach On-call, see care teams to locate the attending and reach out to them via [email or web address]. If 7PM-7AM, please contact night-coverage If you still have difficulty reaching the attending provider, please page the [facility] (Director on Call) for [facility] on amion for assistance.

## 2019-02-18 LAB — BASIC METABOLIC PANEL
Anion gap: 9 (ref 5–15)
BUN: 19 mg/dL (ref 8–23)
CO2: 20 mmol/L — ABNORMAL LOW (ref 22–32)
Calcium: 7.8 mg/dL — ABNORMAL LOW (ref 8.9–10.3)
Chloride: 105 mmol/L (ref 98–111)
Creatinine, Ser: 1.07 mg/dL — ABNORMAL HIGH (ref 0.44–1.00)
GFR calc Af Amer: 58 mL/min — ABNORMAL LOW (ref >60)
GFR calc non Af Amer: 50 mL/min — ABNORMAL LOW (ref >60)
Glucose, Bld: 107 mg/dL — ABNORMAL HIGH (ref 70–99)
Potassium: 4 mmol/L (ref 3.5–5.1)
Sodium: 134 mmol/L — ABNORMAL LOW (ref 135–145)

## 2019-02-18 LAB — CBC WITH DIFFERENTIAL/PLATELET
Abs Immature Granulocytes: 0.12 10*3/uL — ABNORMAL HIGH (ref 0.00–0.07)
Basophils Absolute: 0 10*3/uL (ref 0.0–0.1)
Basophils Relative: 0 %
Eosinophils Absolute: 0 10*3/uL (ref 0.0–0.5)
Eosinophils Relative: 0 %
HCT: 27.8 % — ABNORMAL LOW (ref 36.0–46.0)
Hemoglobin: 9.3 g/dL — ABNORMAL LOW (ref 12.0–15.0)
Immature Granulocytes: 2 %
Lymphocytes Relative: 21 %
Lymphs Abs: 1.5 10*3/uL (ref 0.7–4.0)
MCH: 29.9 pg (ref 26.0–34.0)
MCHC: 33.5 g/dL (ref 30.0–36.0)
MCV: 89.4 fL (ref 80.0–100.0)
Monocytes Absolute: 1.2 10*3/uL — ABNORMAL HIGH (ref 0.1–1.0)
Monocytes Relative: 16 %
Neutro Abs: 4.6 10*3/uL (ref 1.7–7.7)
Neutrophils Relative %: 61 %
Platelets: 181 10*3/uL (ref 150–400)
RBC: 3.11 MIL/uL — ABNORMAL LOW (ref 3.87–5.11)
RDW: 15 % (ref 11.5–15.5)
WBC: 7.4 10*3/uL (ref 4.0–10.5)
nRBC: 0 % (ref 0.0–0.2)

## 2019-02-18 LAB — MAGNESIUM: Magnesium: 2 mg/dL (ref 1.7–2.4)

## 2019-02-18 MED ORDER — ONDANSETRON HCL 4 MG/2ML IJ SOLN
4.0000 mg | Freq: Four times a day (QID) | INTRAMUSCULAR | Status: DC | PRN
  Administered 2019-02-18: 4 mg via INTRAVENOUS
  Filled 2019-02-18: qty 2

## 2019-02-18 MED ORDER — FERROUS SULFATE 300 (60 FE) MG/5ML PO SYRP
300.0000 mg | ORAL_SOLUTION | Freq: Three times a day (TID) | ORAL | Status: DC
  Administered 2019-02-18 – 2019-02-19 (×3): 300 mg via ORAL
  Filled 2019-02-18 (×5): qty 5

## 2019-02-18 MED ORDER — FUROSEMIDE 10 MG/ML IJ SOLN
20.0000 mg | Freq: Two times a day (BID) | INTRAMUSCULAR | Status: DC
  Administered 2019-02-18 – 2019-02-19 (×3): 20 mg via INTRAVENOUS
  Filled 2019-02-18 (×3): qty 2

## 2019-02-18 NOTE — Progress Notes (Signed)
[facility] Progress Note  Patient: Julie Wilkerson [id number]   PCP: [name], MD DOB: 1941/01/19   DOA: 02/15/2019   DOS: 02/18/2019   Date of Service: the patient was seen and examined on 02/18/2019  Chief Complaint  Patient presents with  . Shortness of Breath  . Hypotension  . Headache   Brief hospital course: Shynia Proske a 78 y.o.femalewith Past medical history ofarthritis S/P recent hip arthroplasty, history of breast cancer, anxiety, asthma, HLD, osteoporosis, depression. Patient presents with complaints of shortness of breath cough and fatigue. Found to have hypotension in the emergency department. Now has some cough. Reports some redness at the surgical site as well as swelling of the left leg. Also reports some back pain which is bilateral and associated in the middle of the back and continuous in nature. Had some fever at home. Reports constipation. But passing gas. No active bleeding reported.  Currently further plan is continue to improve oxygenation and diuresis.  Subjective: Reports improvement in breathing as well as abdominal pain.  No nausea no vomiting.  Still reports swelling of the leg.  Pain is well controlled.  Assessment and Plan: 1.  SIRS Sepsis ruled out. Presents with complaints of cough and shortness of breath. Initial concern was sepsis. CT scan was performed which showed evidence of groundglass opacities. Procalcitonin level negative lactic acid level negative. Patient actually severely anemic as well as hypotensive and hypoxic which is likely the cause of patient's SIRS. We will continue to hold off on antibiotic.  Monitor cultures.  2.Acute hypoxic respiratory failure Acute blood loss anemia Acute diastolic CHF Hypotension Essential hypertension Bilateral atelectasis Presents with cough and shortness of breath. Hypoxic to 88% on room air.  Currently requiring 2 L of oxygen. Bilateral crackles at lung  exam, elevated BNP, CT shows evidence of groundglass opacities. Coronavirus test negative. Echocardiogram showed preserved EF but diastolic dysfunction. Suspect this is secondary to volume overload plus combination of acute anemia and new CHF. Acute anemia likely secondary to acute blood loss blood S/P 1 PRBC on 02/15/2019. H&H remained stable after that. Currently holding blood pressure medication. Continue incentive spirometry as well as IV Lasix.  3.Recent hip fracture. S/P total hip arthroplasty. Recommend conservative management. No concern for hematoma at the surgical site. Lower extremity Doppler negative for DVT. PT consulted  4.Hyponatremia. Sodium level improving. Likely secondary to volume overload. Osmolarity low. Consistent with hypervolemia Will benefit from Lasix   Breast cancer in female [facility]) In remission. Monitor.  Hyperlipidemia Continue home medication.  Hypothyroid Continue Synthroid.  Constipation Continue stool softener.  Diet: Cardiac diet  DVT Prophylaxis: Subcutaneous Lovenox  Advance goals of care discussion: Full code  Family Communication: no family was present at bedside, at the time of interview.  Disposition:  Discharge to be determined.  Consultants: Ortho Procedures: None  Scheduled Meds: . sodium chloride   Intravenous Once  . aspirin  81 mg Oral BID  . atorvastatin  20 mg Oral Daily  . Chlorhexidine Gluconate Cloth  6 each Topical Daily  . citalopram  20 mg Oral QHS  . famotidine  20 mg Oral Daily  . feeding supplement (ENSURE ENLIVE)  237 mL Oral BID BM  . ferrous sulfate  325 mg Oral TID WC  . heparin injection (subcutaneous)  5,000 Units Subcutaneous Q8H  . levothyroxine  50 mcg Oral Q0600  . mometasone-formoterol  2 puff Inhalation BID  . polyethylene glycol  17 g Oral BID  . primidone  50 mg Oral QHS  . senna-docusate  2 tablet Oral BID  . simethicone  80 mg Oral QID  . sodium chloride flush  3 mL  Intravenous Q12H   Continuous Infusions: PRN Meds: acetaminophen **OR** acetaminophen, albuterol, bisacodyl, HYDROcodone-acetaminophen, methocarbamol Antibiotics: Anti-infectives (From admission, onward)   Start     Dose/Rate Route Frequency Ordered Stop   02/16/19 0800  azithromycin (ZITHROMAX) 500 mg in sodium chloride 0.9 % 250 mL IVPB  Status:  Discontinued     500 mg 250 mL/hr over 60 Minutes Intravenous Every 24 hours 02/16/19 0723 02/16/19 1150   02/16/19 0000  vancomycin (VANCOCIN) IVPB 1000 mg/200 mL premix     1,000 mg 200 mL/hr over 60 Minutes Intravenous  Once 02/15/19 2348 02/16/19 0241   02/16/19 0000  ceFEPIme (MAXIPIME) 2 g in sodium chloride 0.9 % 100 mL IVPB     2 g 200 mL/hr over 30 Minutes Intravenous  Once 02/15/19 2348 02/16/19 0136       Objective: Physical Exam: Vitals:   02/18/19 0700 02/18/19 0703 02/18/19 0742 02/18/19 0800  BP: (!) 126/51   (!) 112/50  Pulse: (!) 56  (!) 56 (!) 55  Resp: 14  13 12   Temp:  97.8 F (36.6 C)    TempSrc:  Oral    SpO2: 98%  98% 98%  Weight:      Height:        Intake/Output Summary (Last 24 hours) at 02/18/2019 1018 Last data filed at 02/18/2019 1014 Gross per 24 hour  Intake 6 ml  Output 1100 ml  Net -1094 ml   Filed Weights   02/15/19 1858 02/17/19 0500 02/18/19 0500  Weight: 46.7 kg 66.3 kg 63.3 kg   General: alert and oriented to time, place, and person. Appear in mild distress, affect appropriate Eyes: PERRL, Conjunctiva normal ENT: Oral Mucosa Clear, moist  Neck: no JVD, no Abnormal Mass Or lumps Cardiovascular: S1 and S2 Present, no Murmur, peripheral pulses symmetrical Respiratory: good respiratory effort, Bilateral Air entry equal and Decreased, no signs of accessory muscle use, bilateral  Crackles, no wheezes Abdomen: Bowel Sound present, Soft and no tenderness, no hernia Skin: no rashes  Extremities: bilateral  Pedal edema, no calf tenderness Neurologic: without any new focal findings Gait not  checked due to patient safety concerns  Data Reviewed: I have personally reviewed and interpreted daily labs, tele strips, imagings as discussed above. I reviewed all nursing notes, pharmacy notes, vitals, pertinent old records I have discussed plan of care as described above with RN and patient/family.  CBC: Recent Labs  Lab 02/14/19 0251 02/15/19 2056 02/16/19 0759 02/17/19 0202 02/18/19 0154  WBC 10.0 12.2* 7.7 7.6 7.4  NEUTROABS  --  8.6* 5.1 4.8 4.6  HGB 9.1* 8.9* 7.5* 8.8* 9.3*  HCT 28.5* 27.1* 22.9* 27.0* 27.8*  MCV 90.2 90.6 90.5 90.6 89.4  PLT 180 156 155 147* 0000000   Basic Metabolic Panel: Recent Labs  Lab 02/14/19 0251 02/15/19 2056 02/16/19 0759 02/17/19 0202 02/18/19 0154  NA 130* 128* 132* 133* 134*  K 4.8 3.9 4.2 4.1 4.0  CL 100 98 105 105 105  CO2 19* 21* 20* 23 20*  GLUCOSE 138* 121* 92 110* 107*  BUN 20 31* 24* 22 19  CREATININE 1.08* 1.13* 1.00 1.00 1.07*  CALCIUM 7.5* 8.1* 7.6* 7.5* 7.8*  MG  --   --   --  2.0 2.0    Liver Function Tests: Recent Labs  Lab 02/15/19 2056 02/16/19  [id number] 02/17/19 0202  AST 51* 40 31  ALT 15 12 14   ALKPHOS 58 51 51  BILITOT 0.7 1.0 1.0  PROT 6.5 5.4* 5.6*  ALBUMIN 3.6 2.9* 3.1*   No results for input(s): LIPASE, AMYLASE in the last 168 hours. No results for input(s): AMMONIA in the last 168 hours. Coagulation Profile: Recent Labs  Lab 02/16/19 0759  INR 1.2   Cardiac Enzymes: No results for input(s): CKTOTAL, CKMB, CKMBINDEX, TROPONINI in the last 168 hours. BNP (last 3 results) No results for input(s): PROBNP in the last 8760 hours. CBG: No results for input(s): GLUCAP in the last 168 hours. Studies: No results found.   Time spent: 35 minutes  Author: Berle Mull, MD [facility] 02/18/2019 10:18 AM  To reach On-call, see care teams to locate the attending and reach out to them via [email or web address]. If 7PM-7AM, please contact night-coverage If you still have difficulty reaching the  attending provider, please page the [facility] (Director on Call) for [facility] on amion for assistance.

## 2019-02-19 LAB — BASIC METABOLIC PANEL
Anion gap: 12 (ref 5–15)
BUN: 18 mg/dL (ref 8–23)
CO2: 19 mmol/L — ABNORMAL LOW (ref 22–32)
Calcium: 7.9 mg/dL — ABNORMAL LOW (ref 8.9–10.3)
Chloride: 102 mmol/L (ref 98–111)
Creatinine, Ser: 0.9 mg/dL (ref 0.44–1.00)
GFR calc Af Amer: >60 mL/min (ref >60)
GFR calc non Af Amer: >60 mL/min (ref >60)
Glucose, Bld: 119 mg/dL — ABNORMAL HIGH (ref 70–99)
Potassium: 3.8 mmol/L (ref 3.5–5.1)
Sodium: 133 mmol/L — ABNORMAL LOW (ref 135–145)

## 2019-02-19 MED ORDER — FUROSEMIDE 20 MG PO TABS: 20.0000 mg | ORAL_TABLET | Freq: Every day | ORAL | 1 refills | Status: DC

## 2019-02-19 MED ORDER — POTASSIUM CHLORIDE ER 10 MEQ PO TBCR: 10.0000 meq | EXTENDED_RELEASE_TABLET | Freq: Every day | ORAL | 1 refills | Status: DC

## 2019-02-19 MED ORDER — SENNOSIDES-DOCUSATE SODIUM 8.6-50 MG PO TABS: 2.0000 | ORAL_TABLET | Freq: Two times a day (BID) | ORAL | Status: DC

## 2019-02-19 MED ORDER — DICYCLOMINE HCL 10 MG PO CAPS
10.0000 mg | ORAL_CAPSULE | Freq: Three times a day (TID) | ORAL | Status: DC
  Administered 2019-02-19: 10 mg via ORAL
  Filled 2019-02-19 (×2): qty 1

## 2019-02-19 MED ORDER — SACCHAROMYCES BOULARDII 250 MG PO CAPS
250.0000 mg | ORAL_CAPSULE | Freq: Two times a day (BID) | ORAL | Status: DC
  Administered 2019-02-19: 250 mg via ORAL
  Filled 2019-02-19: qty 1

## 2019-02-19 NOTE — Discharge Summary (Signed)
Physician Discharge Summary  Julie Wilkerson [id number] DOB: 03-Mar-1941 DOA: 02/15/2019  PCP: [name], MD  Admit date: 02/15/2019 Discharge date: 02/19/2019  Admitted From:home Disposition:home Recommendations for Outpatient Follow-up:  1. Follow up with PCP in 1-2 weeks 2. Please obtain BMP/CBC in one week   Home Health: None Equipment/Devices: None  Discharge Condition stable CODE STATUS: Full code Diet recommendation: Cardiac Brief/Interim Summary:Julie Wilkerson a 78 y.o.femalewith Past medical history ofarthritis S/P recent hip arthroplasty, history of breast cancer, anxiety, asthma, HLD, osteoporosis, depression. Patient presents with complaints of shortness of breath cough and fatigue. Found to have hypotension in the emergency department. Now has some cough. Reports some redness at the surgical site as well as swelling of the left leg. Also reports some back pain which is bilateral and associated in the middle of the back and continuous in nature. Had some fever at home. Reports constipation. But passing gas. No active bleeding reported.   Discharge Diagnoses:  Principal Problem:   Sepsis due to pneumonia [facility]) Active Problems:   Breast cancer in female [facility])   Hyperlipidemia   Essential hypertension   Osteoporosis   Hypothyroid   IBS (irritable bowel syndrome)   Constipation   S/P left THA, AA   CAP (community acquired pneumonia)   Acute blood loss anemia   Acute diastolic CHF (congestive heart failure) (HCC)   Hyponatremia   Acute respiratory failure with hypoxia (HCC)   #1 acute hypoxic respiratory failure secondary to acute diastolic CHF/acute blood loss anemia-patient was treated with IV diuresis.  She was hypoxic on room air at 88%.  She complained of cough and shortness of.  She had elevated BNP over 500 with crackles and CT evidence of groundglass opacities.  She was COVID negative.  Echo showed diastolic dysfunction with  preserved ejection fraction.  She will be discharged on p.o. Lasix 20 mg daily.  She will need to follow-up with her primary care physician.  She is negative over 3.3 L since the Lasix was started.  #2 status post recent hip fracture and total hip arthroplasty lower extremity Doppler negative for DVT  #3 acute blood loss anemia from recent surgery she was transfused 1 unit of packed RBC on 02/15/2019 hemoglobin remained stable.  #4 IBS currently she is having a lot more diarrhea few days ago she was having constipation.  She also complains of abdominal cramps for which she takes hyoscyamine at home.  #5 SIRS sepsis ruled out treated with any antibiotics  #6 hyponatremia improved prior to discharge  #7 hypothyroidism continue Synthroid  Estimated body mass index is 29.17 kg/m as calculated from the following:   Height as of this encounter: 4\' 10"  (1.473 m).   Weight as of this encounter: 63.3 kg.  Discharge Instructions  Discharge Instructions    Diet - low sodium heart healthy   Complete by: As directed    Increase activity slowly   Complete by: As directed      Allergies as of 02/19/2019   No Known Allergies     Medication List    TAKE these medications   aspirin 81 MG chewable tablet Commonly known as: Aspirin Childrens Chew 1 tablet (81 mg total) by mouth 2 (two) times daily. Take for 4 weeks, then resume regular dose.   atorvastatin 20 MG tablet Commonly known as: LIPITOR Take 1 tablet (20 mg total) by mouth daily.   budesonide-formoterol 160-4.5 MCG/ACT inhaler Commonly known as: SYMBICORT Inhale 2 puffs into the lungs 2 (  two) times daily as needed (wheezing).   citalopram 20 MG tablet Commonly known as: CELEXA TAKE 1  TABLET BY MOUTH ONCE DAILY What changed:   how much to take  how to take this  when to take this  additional instructions   docusate sodium 100 MG capsule Commonly known as: Colace Take 1 capsule (100 mg total) by mouth 2 (two) times  daily.   famotidine 20 MG tablet Commonly known as: Pepcid Take 1 tablet (20 mg total) by mouth 2 (two) times daily. What changed: when to take this   ferrous sulfate 325 (65 FE) MG tablet Commonly known as: FerrouSul Take 1 tablet (325 mg total) by mouth 3 (three) times daily with meals for 14 days.   fluticasone 110 MCG/ACT inhaler Commonly known as: Flovent HFA Inhale 1 puff into the lungs 2 (two) times daily. What changed:   when to take this  reasons to take this   furosemide 20 MG tablet Commonly known as: Lasix Take 1 tablet (20 mg total) by mouth daily.   HYDROcodone-acetaminophen 7.5-325 MG tablet Commonly known as: Norco Take 1-2 tablets by mouth every 8 (eight) hours as needed for moderate pain.   hyoscyamine 0.125 MG SL tablet Commonly known as: LEVSIN SL Place 1 tablet (0.125 mg total) under the tongue every 4 (four) hours as needed for cramping.   levothyroxine 50 MCG tablet Commonly known as: SYNTHROID Take 1 tablet (50 mcg total) by mouth daily.   lisinopril 20 MG tablet Commonly known as: ZESTRIL Take 1 tablet (20 mg total) by mouth daily. What changed: when to take this   methocarbamol 500 MG tablet Commonly known as: Robaxin Take 1 tablet (500 mg total) by mouth every 6 (six) hours as needed for muscle spasms.   metoprolol succinate 25 MG 24 hr tablet Commonly known as: TOPROL-XL Take 1 tablet (25 mg total) by mouth daily. What changed: when to take this   omega-3 acid ethyl esters 1 g capsule Commonly known as: LOVAZA Take 2 g by mouth daily.   polyethylene glycol 17 g packet Commonly known as: MIRALAX / GLYCOLAX Take 17 g by mouth 2 (two) times daily.   potassium chloride 10 MEQ tablet Commonly known as: K-DUR Take 1 tablet (10 mEq total) by mouth daily.   primidone 50 MG tablet Commonly known as: MYSOLINE Take 1 tablet (50 mg total) by mouth at bedtime.   PROBIOTIC DAILY PO Take 1 tablet by mouth daily.   senna-docusate 8.6-50  MG tablet Commonly known as: Senokot-S Take 2 tablets by mouth 2 (two) times daily.   sucralfate 1 g tablet Commonly known as: Carafate Take 1 tablet (1 g total) by mouth every 6 (six) hours as needed. What changed: additional instructions      Follow-up Information    [name], MD Follow up.   Specialty: Family Medicine Contact information: [place] [address] [phone]          No Known Allergies  Consultations:*  None   Procedures/Studies: Dg Chest 2 View  Result Date: 02/15/2019 CLINICAL DATA:  Shortness of breath EXAM: CHEST - 2 VIEW COMPARISON:  June 13, 2018 FINDINGS: The mediastinal contour and cardiac silhouette are normal. Mild linear opacities are identified in bilateral mid lung and lung bases probably due to atelectasis or focal scar. There is no focal infiltrate, pulmonary edema, or pleural effusion. The bony structures are stable. IMPRESSION: Mild linear opacities identified in bilateral lungs  either due to linear atelectasis or scar. No focal pneumonia is identified. Electronically Signed   By: Abelardo Diesel M.D.   On: 02/15/2019 21:19   Ct Head Wo Contrast  Result Date: 02/15/2019 CLINICAL DATA:  78 year old female with headache. EXAM: CT HEAD WITHOUT CONTRAST TECHNIQUE: Contiguous axial images were obtained from the base of the skull through the vertex without intravenous contrast. COMPARISON:  Head CT dated 03/29/2017 FINDINGS: Brain: Mild age-related atrophy and chronic microvascular ischemic changes. There is no acute intracranial hemorrhage. No mass effect or midline shift. No extra-axial fluid collection. Vascular: No hyperdense vessel or unexpected calcification. Skull: Normal. Negative for fracture or focal lesion. Sinuses/Orbits: No acute finding. Other: None IMPRESSION: 1. No acute intracranial hemorrhage. 2. Age-related atrophy and chronic microvascular ischemic changes. Electronically Signed   By: Anner Crete M.D.   On: 02/15/2019 23:33   Ct Angio Chest Pe W And/or Wo Contrast  Result Date: 02/15/2019 CLINICAL DATA:  Shortness of breath, chills. EXAM: CT ANGIOGRAPHY CHEST WITH CONTRAST TECHNIQUE: Multidetector CT imaging of the chest was performed using the standard protocol during bolus administration of intravenous contrast. Multiplanar CT image reconstructions and MIPs were obtained to evaluate the vascular anatomy. CONTRAST:  57mL OMNIPAQUE IOHEXOL 350 MG/ML SOLN COMPARISON:  None. FINDINGS: Cardiovascular: No filling defects in the pulmonary arteries to suggest pulmonary emboli. Heart is normal size. Aorta is normal caliber with scattered aortic calcifications. Mediastinum/Nodes: Small hiatal hernia. No mediastinal, hilar, or axillary adenopathy. Lungs/Pleura: Ground-glass opacities within the upper lobes bilaterally. Linear areas of scarring or atelectasis in both mid and lower lungs. Trace bilateral pleural effusions. Upper Abdomen: Imaging into the upper abdomen shows no acute findings. Musculoskeletal: Chest wall soft tissues are unremarkable. No acute bony abnormality. Review of the MIP images confirms the above findings. IMPRESSION: No evidence of pulmonary embolus. Ground-glass opacities in the upper lobes bilaterally could reflect areas of atelectasis, edema or infection. Linear densities in the mid and lower lungs could reflect atelectasis or scarring. Trace bilateral effusions. Small hiatal hernia. Electronically Signed   By: Rolm Baptise M.D.   On: 02/15/2019 23:30   Dg Pelvis Portable  Result Date: 02/13/2019 CLINICAL DATA:  Post left hip arthroplasty. EXAM: PORTABLE PELVIS 1-2 VIEWS COMPARISON:  CT 01/10/2019 and intraoperative films 02/13/2019 FINDINGS: Examination demonstrates expected changes post left total hip arthroplasty with prosthetic components intact and normally located. Minimal degenerate change of the right hip. Degenerative change of the spine. Mild diffuse osteopenia.  Remainder of the exam is unchanged. IMPRESSION: Expected changes post left total hip arthroplasty. Electronically Signed   By: Marin Olp M.D.   On: 02/13/2019 14:08   Dg Chest Port 1 View  Result Date: 02/17/2019 CLINICAL DATA:  Cough and shortness of breath. EXAM: PORTABLE CHEST 1 VIEW COMPARISON:  02/15/2019 FINDINGS: 0917 hours. Low lung volumes. The cardio pericardial silhouette is enlarged. Basilar atelectasis and/or scarring is again noted, similar to prior. No focal airspace consolidation or pulmonary edema. No pleural effusion. The visualized bony structures of the thorax are intact. Telemetry leads overlie the chest. IMPRESSION: No active disease. Electronically Signed   By: Misty Stanley M.D.   On: 02/17/2019 11:49   Dg Abd Portable 1v  Result Date: 02/17/2019 CLINICAL DATA:  Abdominal pain. EXAM: PORTABLE ABDOMEN - 1 VIEW COMPARISON:  None. FINDINGS: No gaseous small bowel dilatation to suggest obstruction. Moderate stool volume seen along the length of the colon. Convex leftward lumbar scoliosis evident. Patient is status post left hip replacement. IMPRESSION: Moderate  stool volume. Electronically Signed   By: Misty Stanley M.D.   On: 02/17/2019 11:47   Dg C-arm 1-60 Min-no Report  Result Date: 02/13/2019 CLINICAL DATA:  Left total hip replacement. EXAM: OPERATIVE LEFT HIP (WITH PELVIS IF PERFORMED) TECHNIQUE: Fluoroscopic spot image(s) were submitted for interpretation post-operatively. Fluoroscopy time is 25 seconds. COMPARISON:  CT left hip dated January 10, 2019. FINDINGS: Two intraoperative fluoroscopic images demonstrate interval left total hip arthroplasty. Femoral head component has not yet been placed. Components are well aligned. No acute osseous abnormality. IMPRESSION: 1. Intraoperative fluoroscopic guidance for left total hip arthroplasty. Electronically Signed   By: Titus Dubin M.D.   On: 02/13/2019 13:29   Dg Hip Operative Unilat W Or W/o Pelvis Left  Result Date:  02/13/2019 CLINICAL DATA:  Left total hip replacement. EXAM: OPERATIVE LEFT HIP (WITH PELVIS IF PERFORMED) TECHNIQUE: Fluoroscopic spot image(s) were submitted for interpretation post-operatively. Fluoroscopy time is 25 seconds. COMPARISON:  CT left hip dated January 10, 2019. FINDINGS: Two intraoperative fluoroscopic images demonstrate interval left total hip arthroplasty. Femoral head component has not yet been placed. Components are well aligned. No acute osseous abnormality. IMPRESSION: 1. Intraoperative fluoroscopic guidance for left total hip arthroplasty. Electronically Signed   By: Titus Dubin M.D.   On: 02/13/2019 13:29   Vas [name] Lower Extremity Venous (dvt)  Result Date: 02/17/2019  Lower Venous Study Indications: Edema.  Risk Factors: Surgery 02/13/2019 - LEFT TOTAL HIP ARTHROPLASTY ANTERIOR APPROACH. Limitations: Poor ultrasound/tissue interface and patient positioning. Comparison Study: No prior studies. Performing Technologist: Oliver Hum RVT  Examination Guidelines: A complete evaluation includes B-mode imaging, spectral Doppler, color Doppler, and power Doppler as needed of all accessible portions of each vessel. Bilateral testing is considered an integral part of a complete examination. Limited examinations for reoccurring indications may be performed as noted.  +---------+---------------+---------+-----------+----------+--------------+ RIGHT    CompressibilityPhasicitySpontaneityPropertiesThrombus Aging +---------+---------------+---------+-----------+----------+--------------+ CFV      Full           Yes      Yes                                 +---------+---------------+---------+-----------+----------+--------------+ SFJ      Full                                                        +---------+---------------+---------+-----------+----------+--------------+ FV Prox  Full                                                         +---------+---------------+---------+-----------+----------+--------------+ FV Mid   Full                                                        +---------+---------------+---------+-----------+----------+--------------+ FV DistalFull                                                        +---------+---------------+---------+-----------+----------+--------------+  PFV      Full                                                        +---------+---------------+---------+-----------+----------+--------------+ POP      Full           Yes      Yes                                 +---------+---------------+---------+-----------+----------+--------------+ PTV      Full                                                        +---------+---------------+---------+-----------+----------+--------------+ PERO     Full                                                        +---------+---------------+---------+-----------+----------+--------------+   +---------+---------------+---------+-----------+----------+--------------+ LEFT     CompressibilityPhasicitySpontaneityPropertiesThrombus Aging +---------+---------------+---------+-----------+----------+--------------+ CFV      Full           Yes      Yes                                 +---------+---------------+---------+-----------+----------+--------------+ SFJ      Full                                                        +---------+---------------+---------+-----------+----------+--------------+ FV Prox  Full                                                        +---------+---------------+---------+-----------+----------+--------------+ FV Mid   Full                                                        +---------+---------------+---------+-----------+----------+--------------+ FV Distal               Yes      Yes                                  +---------+---------------+---------+-----------+----------+--------------+ PFV      Full                                                        +---------+---------------+---------+-----------+----------+--------------+  POP      Full           Yes      Yes                                 +---------+---------------+---------+-----------+----------+--------------+ PTV      Full                                                        +---------+---------------+---------+-----------+----------+--------------+ PERO     Full                                                        +---------+---------------+---------+-----------+----------+--------------+     Summary: Right: There is no evidence of deep vein thrombosis in the lower extremity. No cystic structure found in the popliteal fossa. Left: There is no evidence of deep vein thrombosis in the lower extremity. However, portions of this examination were limited- see technologist comments above. No cystic structure found in the popliteal fossa.  *See table(s) above for measurements and observations. Electronically signed by Curt Jews MD on 02/17/2019 at 4:03:36 AM.    Final     (Echo, Carotid, EGD, Colonoscopy, ERCP)    Subjective: Patient resting in bed no acute distress vital signs are stable complaints of abdominal cramps which is chronic with IBS no nausea vomiting tolerating p.o. intake  Discharge Exam: Vitals:   02/19/19 0739 02/19/19 0827  BP:    Pulse:    Resp:    Temp: 97.9 F (36.6 C)   SpO2:  90%   Vitals:   02/19/19 0400 02/19/19 0600 02/19/19 0739 02/19/19 0827  BP: (!) 128/57     Pulse: 69 71    Resp: 13 11    Temp:   97.9 F (36.6 C)   TempSrc:   Oral   SpO2: 94% 96%  90%  Weight:  63.3 kg    Height:        General: Pt is alert, awake, not in acute distress Cardiovascular: RRR, S1/S2 +, no rubs, no gallops Respiratory: CTA bilaterally, no wheezing, no rhonchi Abdominal: Soft, NT, ND, bowel  sounds + Extremities: no edema, no cyanosis    The results of significant diagnostics from this hospitalization (including imaging, microbiology, ancillary and laboratory) are listed below for reference.     Microbiology: Recent Results (from the past 240 hour(s))  Novel Coronavirus, NAA (Hosp order, Send-out to Ref Lab; TAT 18-24 hrs     Status: None   Collection Time: 02/10/19  4:05 PM   Specimen: Nasopharyngeal Swab; Respiratory  Result Value Ref Range Status   SARS-CoV-2, NAA NOT DETECTED NOT DETECTED Final    Comment: (NOTE) This nucleic acid amplification test was developed and its performance characteristics determined by [employer]. Nucleic acid amplification tests include PCR and TMA. This test has not been FDA cleared or approved. This test has been authorized by FDA under an Emergency Use Authorization (EUA). This test is only authorized for the duration of time the declaration that circumstances exist justifying the authorization of the emergency use of in vitro diagnostic tests  for detection of SARS-CoV-2 virus and/or diagnosis of COVID-19 infection under section 564(b)(1) of the Act, 21 U.S.C. [id number]) (1), unless the authorization is terminated or revoked sooner. When diagnostic testing is negative, the possibility of a false negative result should be considered in the context of a patient's recent exposures and the presence of clinical signs and symptoms consistent with COVID-19. An individual without symptoms of COVID- 19 and who is not shedding SARS-CoV-2 vi rus would expect to have a negative (not detected) result in this assay. Performed At: [facility] [address] [place], [place] [id number] Rush Farmer MD [id number]    [place]  Final    Comment: Performed at [facility], [place] [address], [place], [place] [address]  SARS Coronavirus 2 [facility] order, Performed in [facility] hospital lab)  Nasopharyngeal Nasopharyngeal Swab     Status: None   Collection Time: 02/15/19 11:47 PM   Specimen: Nasopharyngeal Swab  Result Value Ref Range Status   SARS Coronavirus 2 NEGATIVE NEGATIVE Final    Comment: (NOTE) If result is NEGATIVE SARS-CoV-2 target nucleic acids are NOT DETECTED. The SARS-CoV-2 RNA is generally detectable in upper and lower  respiratory specimens during the acute phase of infection. The lowest  concentration of SARS-CoV-2 viral copies this assay can detect is 250  copies / mL. A negative result does not preclude SARS-CoV-2 infection  and should not be used as the sole basis for treatment or other  patient management decisions.  A negative result may occur with  improper specimen collection / handling, submission of specimen other  than nasopharyngeal swab, presence of viral mutation(s) within the  areas targeted by this assay, and inadequate number of viral copies  (<250 copies / mL). A negative result must be combined with clinical  observations, patient history, and epidemiological information. If result is POSITIVE SARS-CoV-2 target nucleic acids are DETECTED. The SARS-CoV-2 RNA is generally detectable in upper and lower  respiratory specimens dur ing the acute phase of infection.  Positive  results are indicative of active infection with SARS-CoV-2.  Clinical  correlation with patient history and other diagnostic information is  necessary to determine patient infection status.  Positive results do  not rule out bacterial infection or co-infection with other viruses. If result is PRESUMPTIVE POSTIVE SARS-CoV-2 nucleic acids MAY BE PRESENT.   A presumptive positive result was obtained on the submitted specimen  and confirmed on repeat testing.  While 2019 novel coronavirus  (SARS-CoV-2) nucleic acids may be present in the submitted sample  additional confirmatory testing may be necessary for epidemiological  and / or clinical management purposes  to  differentiate between  SARS-CoV-2 and other Sarbecovirus currently known to infect humans.  If clinically indicated additional testing with an alternate test  methodology [phone]) is advised. The SARS-CoV-2 RNA is generally  detectable in upper and lower respiratory sp ecimens during the acute  phase of infection. The expected result is Negative. Fact Sheet for Patients:  StrictlyIdeas.no Fact Sheet for Healthcare Providers: BankingDealers.co.za This test is not yet approved or cleared by the [place] FDA and has been authorized for detection and/or diagnosis of SARS-CoV-2 by FDA under an Emergency Use Authorization (EUA).  This EUA will remain in effect (meaning this test can be used) for the duration of the COVID-19 declaration under Section 564(b)(1) of the Act, 21 U.S.C. section 360bbb-3(b)(1), unless the authorization is terminated or revoked sooner. Performed at [facility], [place] [place], [place]  [address]   Blood culture (routine x 2)     Status: None (Preliminary result)   Collection Time: 02/15/19 11:48 PM   Specimen: BLOOD LEFT ARM  Result Value Ref Range Status   Specimen Description   Final    BLOOD LEFT ARM Performed at [facility], [place] [address], [place], [place] [address]    Special Requests   Final    BOTTLES DRAWN AEROBIC AND ANAEROBIC Blood Culture adequate volume Performed at [place] [address], [place], [place] [address]    Culture   Final    NO GROWTH 2 DAYS Performed at [place] [address], [place], [place] [address]    Report Status PENDING  Incomplete     Labs: BNP (last 3 results) Recent Labs    02/16/19 0800  BNP 123456*   Basic Metabolic Panel: Recent Labs  Lab 02/15/19 2056 02/16/19 0759 02/17/19 0202 02/18/19 0154 02/19/19 0245  NA 128* 132* 133* 134* 133*  K 3.9 4.2 4.1 4.0 3.8  CL 98 105 105 105  102  CO2 21* 20* 23 20* 19*  GLUCOSE 121* 92 110* 107* 119*  BUN 31* 24* 22 19 18   CREATININE 1.13* 1.00 1.00 1.07* 0.90  CALCIUM 8.1* 7.6* 7.5* 7.8* 7.9*  MG  --   --  2.0 2.0  --    Liver Function Tests: Recent Labs  Lab 02/15/19 2056 02/16/19 0759 02/17/19 0202  AST 51* 40 31  ALT 15 12 14   ALKPHOS 58 51 51  BILITOT 0.7 1.0 1.0  PROT 6.5 5.4* 5.6*  ALBUMIN 3.6 2.9* 3.1*   No results for input(s): LIPASE, AMYLASE in the last 168 hours. No results for input(s): AMMONIA in the last 168 hours. CBC: Recent Labs  Lab 02/14/19 0251 02/15/19 2056 02/16/19 0759 02/17/19 0202 02/18/19 0154  WBC 10.0 12.2* 7.7 7.6 7.4  NEUTROABS  --  8.6* 5.1 4.8 4.6  HGB 9.1* 8.9* 7.5* 8.8* 9.3*  HCT 28.5* 27.1* 22.9* 27.0* 27.8*  MCV 90.2 90.6 90.5 90.6 89.4  PLT 180 156 155 147* 181   Cardiac Enzymes: No results for input(s): CKTOTAL, CKMB, CKMBINDEX, TROPONINI in the last 168 hours. BNP: Invalid input(s): POCBNP CBG: No results for input(s): GLUCAP in the last 168 hours. D-Dimer No results for input(s): DDIMER in the last 72 hours. Hgb A1c No results for input(s): HGBA1C in the last 72 hours. Lipid Profile No results for input(s): CHOL, HDL, LDLCALC, TRIG, CHOLHDL, LDLDIRECT in the last 72 hours. Thyroid function studies No results for input(s): TSH, T4TOTAL, T3FREE, THYROIDAB in the last 72 hours.  Invalid input(s): FREET3 Anemia work up No results for input(s): VITAMINB12, FOLATE, FERRITIN, TIBC, IRON, RETICCTPCT in the last 72 hours. Urinalysis    Component Value Date/Time   COLORURINE STRAW (A) 02/15/2019 2142   APPEARANCEUR CLEAR 02/15/2019 2142   LABSPEC 1.011 02/15/2019 2142   PHURINE 5.0 02/15/2019 2142   GLUCOSEU NEGATIVE 02/15/2019 2142   GLUCOSEU NEGATIVE 07/17/2018 1539   HGBUR NEGATIVE 02/15/2019 2142   BILIRUBINUR NEGATIVE 02/15/2019 2142   BILIRUBINUR neg 12/12/2018 [place] 02/15/2019 2142   PROTEINUR NEGATIVE 02/15/2019 2142    UROBILINOGEN 0.2 12/12/2018 1619   UROBILINOGEN 0.2 07/17/2018 1539   NITRITE NEGATIVE 02/15/2019 2142   LEUKOCYTESUR NEGATIVE 02/15/2019 2142   Sepsis Labs Invalid input(s): PROCALCITONIN,  WBC,  LACTICIDVEN Microbiology Recent Results (from the past 240 hour(s))  Novel Coronavirus, NAA (Hosp order, Send-out to  Ref Lab; TAT 18-24 hrs     Status: None   Collection Time: 02/10/19  4:05 PM   Specimen: Nasopharyngeal Swab; Respiratory  Result Value Ref Range Status   SARS-CoV-2, NAA NOT DETECTED NOT DETECTED Final    Comment: (NOTE) This nucleic acid amplification test was developed and its performance characteristics determined by [employer]. Nucleic acid amplification tests include PCR and TMA. This test has not been FDA cleared or approved. This test has been authorized by FDA under an Emergency Use Authorization (EUA). This test is only authorized for the duration of time the declaration that circumstances exist justifying the authorization of the emergency use of in vitro diagnostic tests for detection of SARS-CoV-2 virus and/or diagnosis of COVID-19 infection under section 564(b)(1) of the Act, 21 U.S.C. [id number]) (1), unless the authorization is terminated or revoked sooner. When diagnostic testing is negative, the possibility of a false negative result should be considered in the context of a patient's recent exposures and the presence of clinical signs and symptoms consistent with COVID-19. An individual without symptoms of COVID- 19 and who is not shedding SARS-CoV-2 vi rus would expect to have a negative (not detected) result in this assay. Performed At: [facility] [address] [place], [place] [id number] Rush Farmer MD [id number]    [place]  Final    Comment: Performed at [facility], [place] [address] [place], [place] [address]  SARS Coronavirus 2 [facility] order, Performed in [facility] hospital lab)  Nasopharyngeal Nasopharyngeal Swab     Status: None   Collection Time: 02/15/19 11:47 PM   Specimen: Nasopharyngeal Swab  Result Value Ref Range Status   SARS Coronavirus 2 NEGATIVE NEGATIVE Final    Comment: (NOTE) If result is NEGATIVE SARS-CoV-2 target nucleic acids are NOT DETECTED. The SARS-CoV-2 RNA is generally detectable in upper and lower  respiratory specimens during the acute phase of infection. The lowest  concentration of SARS-CoV-2 viral copies this assay can detect is 250  copies / mL. A negative result does not preclude SARS-CoV-2 infection  and should not be used as the sole basis for treatment or other  patient management decisions.  A negative result may occur with  improper specimen collection / handling, submission of specimen other  than nasopharyngeal swab, presence of viral mutation(s) within the  areas targeted by this assay, and inadequate number of viral copies  (<250 copies / mL). A negative result must be combined with clinical  observations, patient history, and epidemiological information. If result is POSITIVE SARS-CoV-2 target nucleic acids are DETECTED. The SARS-CoV-2 RNA is generally detectable in upper and lower  respiratory specimens dur ing the acute phase of infection.  Positive  results are indicative of active infection with SARS-CoV-2.  Clinical  correlation with patient history and other diagnostic information is  necessary to determine patient infection status.  Positive results do  not rule out bacterial infection or co-infection with other viruses. If result is PRESUMPTIVE POSTIVE SARS-CoV-2 nucleic acids MAY BE PRESENT.   A presumptive positive result was obtained on the submitted specimen  and confirmed on repeat testing.  While 2019 novel coronavirus  (SARS-CoV-2) nucleic acids may be present in the submitted sample  additional confirmatory testing may be necessary for epidemiological  and / or clinical management purposes  to  differentiate between  SARS-CoV-2 and other Sarbecovirus currently known to infect humans.  If clinically indicated additional testing with an alternate test  methodology [id number]) is advised. The SARS-CoV-2  RNA is generally  detectable in upper and lower respiratory sp ecimens during the acute  phase of infection. The expected result is Negative. Fact Sheet for Patients:  StrictlyIdeas.no Fact Sheet for Healthcare Providers: BankingDealers.co.za This test is not yet approved or cleared by the [place] FDA and has been authorized for detection and/or diagnosis of SARS-CoV-2 by FDA under an Emergency Use Authorization (EUA).  This EUA will remain in effect (meaning this test can be used) for the duration of the COVID-19 declaration under Section 564(b)(1) of the Act, 21 U.S.C. section 360bbb-3(b)(1), unless the authorization is terminated or revoked sooner. Performed at [facility], [place] [address] [place], [place] [address]   Blood culture (routine x 2)     Status: None (Preliminary result)   Collection Time: 02/15/19 11:48 PM   Specimen: BLOOD LEFT ARM  Result Value Ref Range Status   Specimen Description   Final    BLOOD LEFT ARM Performed at [facility], [place] [address] [place], [place] [address]    Special Requests   Final    BOTTLES DRAWN AEROBIC AND ANAEROBIC Blood Culture adequate volume Performed at [place] [address], [place], [place] [address]    Culture   Final    NO GROWTH 2 DAYS Performed at [place] [address] [place], [place] [address]    Report Status PENDING  Incomplete     Time coordinating discharge: 39 minutes  SIGNED:   Georgette Shell, MD  [facility] 02/19/2019, 10:26 AM Pager   If 7PM-7AM, please contact night-coverage [email or web address] Password [email or web address]

## 2019-02-19 NOTE — TOC Transition Note (Signed)
Transition of Care [facility]) - CM/SW Discharge Note   Patient Details  Name: Julie Wilkerson MRN: [id number] Date of Birth: 1940/07/15  Transition of Care [facility]) CM/SW Contact:  Lynnell Catalan, RN Phone Number:[phone] 02/19/2019, 12:50 PM   Clinical Narrative:     Pt states she was doing (Home Exercise Program) prior to admission for physical therapy and would like to continue to do that when she gets home. Pt has a RW and 3in1 at home.

## 2019-02-19 NOTE — Discharge Summary (Signed)
Physician Discharge Summary  Patient ID: Julie Wilkerson MRN: [id number] DOB/AGE: July 03, 1940 78 y.o.  Admit date: 02/13/2019 Discharge date: 02/14/2019   Procedures:  Procedure(s) (LRB): TOTAL HIP ARTHROPLASTY ANTERIOR APPROACH (Left)  Attending Physician:  Dr. Paralee Cancel   Admission Diagnoses:   Left hip primary OA / pain  Discharge Diagnoses:  Principal Problem:   S/P left THA, AA Active Problems:   Overweight (BMI 25.0-29.9)  Past Medical History:  Diagnosis Date  . Anemia   . Anginal pain ([place])   . Anxiety   . Arthritis   . Asthma   . Breast cancer ([place])    2002, left, encapsulated, microcalcifications. lumpectomy, radtiation x 30  . Breast cancer in female [facility])   . Change in mole 06/08/2016  . Colitis   . Complication of anesthesia    VERY SENSITIVE TO ANESTHESIA   . Decreased visual acuity 01/16/2017  . Depression   . Dyspnea 11/04/2016   with asthma exacerbation only  . Dysuria 11/04/2016  . Family history of adverse reaction to anesthesia   . Gluten intolerance   . History of chicken pox   . History of Helicobacter pylori infection 08/01/2015  . Hyperlipidemia   . Hypertension   . Hypothyroid   . IBS (irritable bowel syndrome)   . Left leg pain 03/01/2017  . Low back pain 08/10/2015  . Neck pain 03/01/2017  . Osteoporosis   . Personal history of radiation therapy   . Pneumonia   . PONV (postoperative nausea and vomiting)   . RLQ discomfort 03/01/2017  . Stomach cramps   . Urinary tract infection 11/04/2016    HPI:    Julie Wilkerson, 78 y.o. female, has a history of pain and functional disability in the left hip(s) due to arthritis and patient has failed non-surgical conservative treatments for greater than 12 weeks to include NSAID's and/or analgesics, corticosteriod injections, use of assistive devices and activity modification.  Onset of symptoms was gradual starting  years ago with gradually worsening course since that time.The patient noted no  past surgery on the left hip(s).  Patient currently rates pain in the left hip at 9 out of 10 with activity. Patient has night pain, worsening of pain with activity and weight bearing, trendelenberg gait, pain that interfers with activities of daily living and pain with passive range of motion. Patient has evidence of periarticular osteophytes and joint space narrowing by imaging studies. This condition presents safety issues increasing the risk of falls. There is no current active infection.  Risks, benefits and expectations were discussed with the patient.  Risks including but not limited to the risk of anesthesia, blood clots, nerve damage, blood vessel damage, failure of the prosthesis, infection and up to and including death.  Patient understand the risks, benefits and expectations and wishes to proceed with surgery.   PCP: [name], MD   Discharged Condition: good  Hospital Course:  Patient underwent the above stated procedure on 02/13/2019. Patient tolerated the procedure well and brought to the recovery room in good condition and subsequently to the floor.  POD #1 BP: 125/61 ; Pulse: 58 ; Temp: 97.6 F (36.4 C) ; Resp: 14 Patient reports pain as mild, pain controlled with medication.  No reported events throughout the night.  Patient feels that she is progressing well.  Patient is ready to be discharged home. Dorsiflexion/plantar flexion intact, incision: dressing C/D/I, no cellulitis present and compartment soft.   LABS  Basename    HGB  9.1  HCT     28.5    Discharge Exam: General appearance: alert, cooperative and no distress Extremities: Homans sign is negative, no sign of DVT, no edema, redness or tenderness in the calves or thighs and no ulcers, gangrene or trophic changes  Disposition:  Home with follow up in 2 weeks   Follow-up Information    Paralee Cancel, MD. Schedule an appointment as soon as possible for a visit in 2 weeks.   Specialty: Orthopedic Surgery  Contact information: [address] [place] [address] [phone]           Discharge Instructions    Call MD / Call 911   Complete by: As directed    If you experience chest pain or shortness of breath, CALL 911 and be transported to the hospital emergency room.  If you develope a fever above 101 F, pus (white drainage) or increased drainage or redness at the wound, or calf pain, call your surgeon's office.   Change dressing   Complete by: As directed    Maintain surgical dressing until follow up in the clinic. If the edges start to pull up, may reinforce with tape. If the dressing is no longer working, may remove and cover with gauze and tape, but must keep the area dry and clean.  Call with any questions or concerns.   Constipation Prevention   Complete by: As directed    Drink plenty of fluids.  Prune juice may be helpful.  You may use a stool softener, such as Colace (over the counter) 100 mg twice a day.  Use MiraLax (over the counter) for constipation as needed.   Diet - low sodium heart healthy   Complete by: As directed    Discharge instructions   Complete by: As directed    Maintain surgical dressing until follow up in the clinic. If the edges start to pull up, may reinforce with tape. If the dressing is no longer working, may remove and cover with gauze and tape, but must keep the area dry and clean.  Follow up in 2 weeks at [facility]. Call with any questions or concerns.   Increase activity slowly as tolerated   Complete by: As directed    Weight bearing as tolerated with assist device (walker, cane, etc) as directed, use it as long as suggested by your surgeon or therapist, typically at least 4-6 weeks.   TED hose   Complete by: As directed    Use stockings (TED hose) for 2 weeks on both leg(s).  You may remove them at night for sleeping.      Allergies as of 02/14/2019   No Known Allergies     Medication List    TAKE these  medications   aspirin 81 MG chewable tablet Commonly known as: Aspirin Childrens Chew 1 tablet (81 mg total) by mouth 2 (two) times daily. Take for 4 weeks, then resume regular dose.   atorvastatin 20 MG tablet Commonly known as: LIPITOR Take 1 tablet (20 mg total) by mouth daily.   citalopram 20 MG tablet Commonly known as: CELEXA TAKE 1  TABLET BY MOUTH ONCE DAILY What changed:   how much to take  how to take this  when to take this  additional instructions   docusate sodium 100 MG capsule Commonly known as: Colace Take 1 capsule (100 mg total) by mouth 2 (two) times daily.   famotidine 20 MG tablet Commonly known as: Pepcid Take 1 tablet (  20 mg total) by mouth 2 (two) times daily. What changed: when to take this   ferrous sulfate 325 (65 FE) MG tablet Commonly known as: FerrouSul Take 1 tablet (325 mg total) by mouth 3 (three) times daily with meals for 14 days.   fluticasone 110 MCG/ACT inhaler Commonly known as: Flovent HFA Inhale 1 puff into the lungs 2 (two) times daily. What changed:   when to take this  reasons to take this   HYDROcodone-acetaminophen 7.5-325 MG tablet Commonly known as: Norco Take 1-2 tablets by mouth every 8 (eight) hours as needed for moderate pain.   hyoscyamine 0.125 MG SL tablet Commonly known as: LEVSIN SL Place 1 tablet (0.125 mg total) under the tongue every 4 (four) hours as needed for cramping.   levothyroxine 50 MCG tablet Commonly known as: SYNTHROID Take 1 tablet (50 mcg total) by mouth daily.   lisinopril 20 MG tablet Commonly known as: ZESTRIL Take 1 tablet (20 mg total) by mouth daily. What changed: when to take this   methocarbamol 500 MG tablet Commonly known as: Robaxin Take 1 tablet (500 mg total) by mouth every 6 (six) hours as needed for muscle spasms.   metoprolol succinate 25 MG 24 hr tablet Commonly known as: TOPROL-XL Take 1 tablet (25 mg total) by mouth daily. What changed: when to take this    omega-3 acid ethyl esters 1 g capsule Commonly known as: LOVAZA Take 2 g by mouth daily.   polyethylene glycol 17 g packet Commonly known as: MIRALAX / GLYCOLAX Take 17 g by mouth 2 (two) times daily. What changed: when to take this   primidone 50 MG tablet Commonly known as: MYSOLINE Take 1 tablet (50 mg total) by mouth at bedtime.   PROBIOTIC DAILY PO Take 1 tablet by mouth daily.   sucralfate 1 g tablet Commonly known as: Carafate Take 1 tablet (1 g total) by mouth every 6 (six) hours as needed. What changed: additional instructions            Discharge Care Instructions  (From admission, onward)         Start     Ordered   02/14/19 0000  Change dressing    Comments: Maintain surgical dressing until follow up in the clinic. If the edges start to pull up, may reinforce with tape. If the dressing is no longer working, may remove and cover with gauze and tape, but must keep the area dry and clean.  Call with any questions or concerns.   02/14/19 0750           Signed: [place]. Jensine Luz   PA-C  02/19/2019, 8:49 PM

## 2019-02-19 NOTE — Evaluation (Signed)
Physical Therapy Evaluation Patient Details Name: Julie Wilkerson MRN: [id number] DOB: [birth year] Today's Date: 02/19/2019   History of Present Illness  Pt is 78 y.o. female s/p Lt THA ant approach on 02/13/19. Pt d/c without complications, readmitted on 9/24 with SIRS, respiratory failure. CT chest negative for PE but did reveal ground glass opacities. COVID 19 negative. PMH significant for HTN ,HLD, neck pain, back pain, depression, arthritis, and history of breast cancer.  Clinical Impression   Pt presents with mild L hip pain, LE weakness, slow and steady gait, difficulty performing bed mobility tasks, and decreased activity tolerance. Pt to benefit from acute PT to address deficits. Pt ambulated hallway distance with RW with min guard to supervision level of assist, pt mostly steady but deconditioned. PT recommending HHPT to ensure pt is progressing post-operatively and post-acutely, pt's daughter who pt is staying with will be assisting her in the mornings and evenings, but works during the day. Pt is working on getting a Psychologist, clinical and has friends that come over to stay with her during the day to assist her as needed. PT to progress mobility as tolerated, and will continue to follow acutely.      Follow Up Recommendations Home health PT(HEP)    Equipment Recommendations  None recommended by PT(has a walker and daughter ordering a 3:1 [place])    Recommendations for Other Services       Precautions / Restrictions Precautions Precautions: Fall Restrictions Weight Bearing Restrictions: No Other Position/Activity Restrictions: L DA-THA on 9/22, no precautions      Mobility  Bed Mobility Overal bed mobility: Needs Assistance Bed Mobility: Supine to Sit     Supine to sit: Min assist     General bed mobility comments: Min assist for LLE lifting and translation to EOB, pt states her daughter at times has to help her out of bed.  Transfers Overall transfer level: Needs  assistance Equipment used: Rolling walker (2 wheeled) Transfers: Sit to/from [facility] Sit to Stand: From elevated surface;Min guard Stand pivot transfers: Supervision;From elevated surface       General transfer comment: Min guard for safety for standing, proper hand placement when rising. Supervision for stand pivot to [facility] for BM, increased time to turn and sit.  Ambulation/Gait Ambulation/Gait assistance: Supervision;Min guard Gait Distance (Feet): 200 Feet Assistive device: Rolling walker (2 wheeled) Gait Pattern/deviations: Step-through pattern;Decreased stride length;Trunk flexed Gait velocity: decr   General Gait Details: Min guard for safety, transitioning to supervision due to pt steadiness. VC for placement in RW x3 during ambulation. Sats on RA 89-95%, pt encouraged to take one standing rest break to recover sats when below 90%.  Stairs Stairs: (NT - pt states she has no difficulty with stair navigation, and has assist from daughter if needed)          Wheelchair Mobility    Modified Rankin (Stroke Patients Only)       Balance Overall balance assessment: Needs assistance Sitting-balance support: Bilateral upper extremity supported;Feet supported Sitting balance-Leahy Scale: Fair     Standing balance support: During functional activity;Bilateral upper extremity supported Standing balance-Leahy Scale: Fair Standing balance comment: pt requries UE support for dynamic activity                             Pertinent Vitals/Pain Pain Assessment: 0-10 Pain Score: 2  Pain Location: Lt hip Pain Descriptors / Indicators: Sore Pain Intervention(s): Limited activity within patient's tolerance;Monitored  during session;Repositioned    Home Living Family/patient expects to be discharged to:: Private residence Living Arrangements: Alone Available Help at Discharge: Family(pt is staying with daughter, who is a PA. Pt has friends that come  over during the day) Type of Home: House Home Access: Stairs to enter Entrance Stairs-Rails: Right;None Technical brewer of Steps: pt's home: threshold at front door, no hand rail; daughter's home: 3 steps with Rt handrail going up Home Layout: One level;Two level;Able to live on main level with bedroom/bathroom Home Equipment: Gilford Rile - 2 wheels;Shower seat - built in;Cane - single point Additional Comments: pt is going to stay at her daughter's home for 1 week following surgery    Prior Function Level of Independence: Independent with assistive device(s)         Comments: pt using RW for ambulation PTA, reports L hip has been doing well post-operatively     Hand Dominance   Dominant Hand: Right    Extremity/Trunk Assessment   Upper Extremity Assessment Upper Extremity Assessment: Overall WFL for tasks assessed    Lower Extremity Assessment Lower Extremity Assessment: Generalized weakness    Cervical / Trunk Assessment Cervical / Trunk Assessment: Normal  Communication   Communication: No difficulties  Cognition Arousal/Alertness: Awake/alert Behavior During Therapy: WFL for tasks assessed/performed Overall Cognitive Status: Within Functional Limits for tasks assessed                                        General Comments      Exercises Total Joint Exercises Ankle Circles/Pumps: Both;10 reps;Seated;AROM Quad Sets: AROM;5 reps;Seated;Left Heel Slides: AAROM;Left;5 reps;Seated Hip ABduction/ADduction: AAROM;Left;5 reps;Seated   Assessment/Plan    PT Assessment Patient needs continued PT services  PT Problem List Decreased strength;Decreased balance;Decreased range of motion;Decreased mobility;Decreased activity tolerance;Decreased knowledge of use of DME;Decreased knowledge of precautions;Pain       PT Treatment Interventions DME instruction;Functional mobility training;Patient/family education;Balance training;Gait training;Therapeutic  activities;Therapeutic exercise;Stair training    PT Goals (Current goals can be found in the Care Plan section)  Acute Rehab PT Goals Patient Stated Goal: to return home PT Goal Formulation: With patient Time For Goal Achievement: 03/05/19 Potential to Achieve Goals: Good    Frequency Min 3X/week   Barriers to discharge        Co-evaluation               AM-PAC PT "6 Clicks" Mobility  Outcome Measure Help needed turning from your back to your side while in a flat bed without using bedrails?: A Little Help needed moving from lying on your back to sitting on the side of a flat bed without using bedrails?: A Little Help needed moving to and from a bed to a chair (including a wheelchair)?: A Little Help needed standing up from a chair using your arms (e.g., wheelchair or bedside chair)?: None Help needed to walk in hospital room?: A Little Help needed climbing 3-5 steps with a railing? : A Little 6 Click Score: 19    End of Session Equipment Utilized During Treatment: Gait belt Activity Tolerance: Patient tolerated treatment well Patient left: with call bell/phone within reach;in chair(pt agrees not to mobilize without PT assist) Nurse Communication: Mobility status PT Visit Diagnosis: Muscle weakness (generalized) (M62.81);Difficulty in walking, not elsewhere classified (R26.2);Unsteadiness on feet (R26.81)    Time: [id number] PT Time Calculation (min) (ACUTE ONLY): 33 min   Charges:  PT Evaluation $PT Eval Low Complexity: 1 Low PT Treatments $Gait Training: 8-22 mins        Julien Girt, PT Acute Rehabilitation Services Pager [phone]  Office [phone]  Roxine Caddy D Elonda Husky 02/19/2019, 12:34 PM

## 2019-02-20 ENCOUNTER — Telehealth: Payer: Self-pay | Admitting: *Deleted

## 2019-02-20 NOTE — Telephone Encounter (Signed)
LVM for pt to call office

## 2019-02-20 NOTE — Telephone Encounter (Signed)
Transition Care Management Follow-up Telephone Call   Date discharged? 02/19/19   How have you been since you were released from the hospital? "better"   Do you understand why you were in the hospital? yes   Do you understand the discharge instructions? yes   Where were you discharged to? Home. Daughter is w/ her.   Items Reviewed:  Medications reviewed: Pt states she will bring list to appt w/ PCP  Allergies reviewed: yes  Dietary changes reviewed: yes  Referrals reviewed: yes   Functional Questionnaire:   Activities of Daily Living (ADLs):   She states they are independent in the following: ambulation, bathing and hygiene, feeding, continence, grooming, toileting and dressing using walker States they require assistance with the following: na   Any transportation issues/concerns?: no   Any patient concerns? no   Confirmed importance and date/time of follow-up visits scheduled yes  Provider Appointment booked with PCP 02/26/19  Confirmed with patient if condition begins to worsen call PCP or go to the ER.  Patient was given the office number and encouraged to call back with question or concerns.  : yes

## 2019-02-21 LAB — CULTURE, BLOOD (ROUTINE X 2)
Culture: NO GROWTH
Special Requests: ADEQUATE

## 2019-02-22 ENCOUNTER — Other Ambulatory Visit: Payer: Self-pay

## 2019-02-22 ENCOUNTER — Emergency Department (HOSPITAL_BASED_OUTPATIENT_CLINIC_OR_DEPARTMENT_OTHER): Payer: Medicare HMO

## 2019-02-22 ENCOUNTER — Inpatient Hospital Stay (HOSPITAL_BASED_OUTPATIENT_CLINIC_OR_DEPARTMENT_OTHER)
Admission: EM | Admit: 2019-02-22 | Discharge: 2019-02-24 | DRG: 293 | Disposition: A | Discharge status: Home Health | Payer: Medicare HMO | Attending: Internal Medicine | Admitting: Internal Medicine

## 2019-02-22 ENCOUNTER — Encounter (HOSPITAL_BASED_OUTPATIENT_CLINIC_OR_DEPARTMENT_OTHER): Payer: Self-pay | Admitting: *Deleted

## 2019-02-22 DIAGNOSIS — I5033 Acute on chronic diastolic (congestive) heart failure: Secondary | ICD-10-CM | POA: Diagnosis present

## 2019-02-22 DIAGNOSIS — I110 Hypertensive heart disease with heart failure: Secondary | ICD-10-CM | POA: Diagnosis not present

## 2019-02-22 DIAGNOSIS — Z8701 Personal history of pneumonia (recurrent): Secondary | ICD-10-CM

## 2019-02-22 DIAGNOSIS — K589 Irritable bowel syndrome without diarrhea: Secondary | ICD-10-CM | POA: Diagnosis present

## 2019-02-22 DIAGNOSIS — Z923 Personal history of irradiation: Secondary | ICD-10-CM

## 2019-02-22 DIAGNOSIS — Z87440 Personal history of urinary (tract) infections: Secondary | ICD-10-CM

## 2019-02-22 DIAGNOSIS — Z20828 Contact with and (suspected) exposure to other viral communicable diseases: Secondary | ICD-10-CM | POA: Diagnosis present

## 2019-02-22 DIAGNOSIS — E039 Hypothyroidism, unspecified: Secondary | ICD-10-CM | POA: Diagnosis present

## 2019-02-22 DIAGNOSIS — E871 Hypo-osmolality and hyponatremia: Secondary | ICD-10-CM

## 2019-02-22 DIAGNOSIS — I6782 Cerebral ischemia: Secondary | ICD-10-CM | POA: Diagnosis not present

## 2019-02-22 DIAGNOSIS — I10 Essential (primary) hypertension: Secondary | ICD-10-CM | POA: Diagnosis present

## 2019-02-22 DIAGNOSIS — Z853 Personal history of malignant neoplasm of breast: Secondary | ICD-10-CM

## 2019-02-22 DIAGNOSIS — F419 Anxiety disorder, unspecified: Secondary | ICD-10-CM | POA: Diagnosis present

## 2019-02-22 DIAGNOSIS — I509 Heart failure, unspecified: Secondary | ICD-10-CM | POA: Diagnosis not present

## 2019-02-22 DIAGNOSIS — M810 Age-related osteoporosis without current pathological fracture: Secondary | ICD-10-CM | POA: Diagnosis present

## 2019-02-22 DIAGNOSIS — E785 Hyperlipidemia, unspecified: Secondary | ICD-10-CM | POA: Diagnosis present

## 2019-02-22 DIAGNOSIS — Z9861 Coronary angioplasty status: Secondary | ICD-10-CM

## 2019-02-22 DIAGNOSIS — Z8249 Family history of ischemic heart disease and other diseases of the circulatory system: Secondary | ICD-10-CM

## 2019-02-22 DIAGNOSIS — Z79899 Other long term (current) drug therapy: Secondary | ICD-10-CM

## 2019-02-22 DIAGNOSIS — R079 Chest pain, unspecified: Secondary | ICD-10-CM | POA: Diagnosis not present

## 2019-02-22 DIAGNOSIS — Z8349 Family history of other endocrine, nutritional and metabolic diseases: Secondary | ICD-10-CM

## 2019-02-22 DIAGNOSIS — Z79890 Hormone replacement therapy (postmenopausal): Secondary | ICD-10-CM

## 2019-02-22 DIAGNOSIS — M1990 Unspecified osteoarthritis, unspecified site: Secondary | ICD-10-CM | POA: Diagnosis present

## 2019-02-22 DIAGNOSIS — Z7982 Long term (current) use of aspirin: Secondary | ICD-10-CM

## 2019-02-22 DIAGNOSIS — J45909 Unspecified asthma, uncomplicated: Secondary | ICD-10-CM | POA: Diagnosis present

## 2019-02-22 DIAGNOSIS — Z7951 Long term (current) use of inhaled steroids: Secondary | ICD-10-CM

## 2019-02-22 DIAGNOSIS — R519 Headache, unspecified: Secondary | ICD-10-CM | POA: Diagnosis not present

## 2019-02-22 DIAGNOSIS — I2510 Atherosclerotic heart disease of native coronary artery without angina pectoris: Secondary | ICD-10-CM | POA: Diagnosis present

## 2019-02-22 LAB — URINALYSIS, ROUTINE W REFLEX MICROSCOPIC
Bilirubin Urine: NEGATIVE
Glucose, UA: NEGATIVE mg/dL
Hgb urine dipstick: NEGATIVE
Ketones, ur: NEGATIVE mg/dL
Leukocytes,Ua: NEGATIVE
Nitrite: NEGATIVE
Protein, ur: NEGATIVE mg/dL
Specific Gravity, Urine: <1.005 — ABNORMAL LOW (ref 1.005–1.030)
pH: 7.5 (ref 5.0–8.0)

## 2019-02-22 LAB — CBC
HCT: 28.8 % — ABNORMAL LOW (ref 36.0–46.0)
Hemoglobin: 9.5 g/dL — ABNORMAL LOW (ref 12.0–15.0)
MCH: 29 pg (ref 26.0–34.0)
MCHC: 33 g/dL (ref 30.0–36.0)
MCV: 87.8 fL (ref 80.0–100.0)
Platelets: 265 10*3/uL (ref 150–400)
RBC: 3.28 MIL/uL — ABNORMAL LOW (ref 3.87–5.11)
RDW: 14.3 % (ref 11.5–15.5)
WBC: 6.8 10*3/uL (ref 4.0–10.5)
nRBC: 0 % (ref 0.0–0.2)

## 2019-02-22 LAB — BASIC METABOLIC PANEL
Anion gap: 13 (ref 5–15)
BUN: 21 mg/dL (ref 8–23)
CO2: 26 mmol/L (ref 22–32)
Calcium: 8.4 mg/dL — ABNORMAL LOW (ref 8.9–10.3)
Chloride: 91 mmol/L — ABNORMAL LOW (ref 98–111)
Creatinine, Ser: 1 mg/dL (ref 0.44–1.00)
GFR calc Af Amer: >60 mL/min (ref >60)
GFR calc non Af Amer: 54 mL/min — ABNORMAL LOW (ref >60)
Glucose, Bld: 128 mg/dL — ABNORMAL HIGH (ref 70–99)
Potassium: 4.1 mmol/L (ref 3.5–5.1)
Sodium: 130 mmol/L — ABNORMAL LOW (ref 135–145)

## 2019-02-22 LAB — BRAIN NATRIURETIC PEPTIDE: B Natriuretic Peptide: 310.7 pg/mL — ABNORMAL HIGH (ref 0.0–100.0)

## 2019-02-22 LAB — TROPONIN I (HIGH SENSITIVITY)
Troponin I (High Sensitivity): 6 ng/L (ref <18)
Troponin I (High Sensitivity): 7 ng/L (ref <18)

## 2019-02-22 MED ORDER — SODIUM CHLORIDE 0.9% FLUSH
3.0000 mL | Freq: Once | INTRAVENOUS | Status: AC
  Administered 2019-02-22: 3 mL via INTRAVENOUS
  Filled 2019-02-22: qty 3

## 2019-02-22 MED ORDER — FUROSEMIDE 10 MG/ML IJ SOLN
40.0000 mg | Freq: Once | INTRAMUSCULAR | Status: AC
  Administered 2019-02-22: 40 mg via INTRAVENOUS
  Filled 2019-02-22: qty 4

## 2019-02-22 MED ORDER — DIPHENHYDRAMINE HCL 50 MG/ML IJ SOLN
12.5000 mg | Freq: Once | INTRAMUSCULAR | Status: AC
  Administered 2019-02-22: 12.5 mg via INTRAVENOUS
  Filled 2019-02-22: qty 1

## 2019-02-22 MED ORDER — FENTANYL CITRATE (PF) 100 MCG/2ML IJ SOLN
25.0000 ug | Freq: Once | INTRAMUSCULAR | Status: AC
  Administered 2019-02-22: 25 ug via INTRAVENOUS
  Filled 2019-02-22: qty 2

## 2019-02-22 NOTE — ED Notes (Signed)
ED TO INPATIENT HANDOFF REPORT  ED Nurse Name and Phone #: [phone]   S Name/Age/Gender Julie Wilkerson 78 y.o. female Room/Bed: MH03/MH03  Code Status   Code Status: Prior  Home/SNF/Other Home Patient oriented to: self, place, time and situation Is this baseline? Yes   Triage Complete: Triage complete  Chief Complaint Chest Pain; SOB; Headache  Triage Note Pt is here for CP and sob.  Pt was d/c 2 days ago after being admitted for CHF exacerbation.  Pt reports lack of energy and HA with this also, she has not taken any medication pta.    Allergies No Known Allergies  Level of Care/Admitting Diagnosis ED Disposition    ED Disposition Condition [facility] Area: [place] [[id number]]  Level of Care: Telemetry Cardiac [[id number]]  Covid Evaluation: Asymptomatic Screening Protocol (No Symptoms)  Diagnosis: Acute CHF (congestive heart failure) ([place]) [id number]  Admitting Physician: Elwyn Reach [[id number]]  Attending Physician: Elwyn Reach [[id number]]  Estimated length of stay: past midnight tomorrow  Certification:: I certify this patient will need inpatient services for at least 2 midnights  PT Class (Do Not Modify): Inpatient [101]  PT Acc Code (Do Not Modify): Private [1]       B Medical/Surgery History Past Medical History:  Diagnosis Date  . Anemia   . Anginal pain ([place])   . Anxiety   . Arthritis   . Asthma   . Breast cancer ([place])    2002, left, encapsulated, microcalcifications. lumpectomy, radtiation x 30  . Breast cancer in female [facility])   . Change in mole 06/08/2016  . Colitis   . Complication of anesthesia    VERY SENSITIVE TO ANESTHESIA   . Decreased visual acuity 01/16/2017  . Depression   . Dyspnea 11/04/2016   with asthma exacerbation only  . Dysuria 11/04/2016  . Family history of adverse reaction to anesthesia   . Gluten intolerance   . History of chicken pox   . History of Helicobacter pylori infection 08/01/2015   . Hyperlipidemia   . Hypertension   . Hypothyroid   . IBS (irritable bowel syndrome)   . Left leg pain 03/01/2017  . Low back pain 08/10/2015  . Neck pain 03/01/2017  . Osteoporosis   . Personal history of radiation therapy   . Pneumonia   . PONV (postoperative nausea and vomiting)   . RLQ discomfort 03/01/2017  . Stomach cramps   . Urinary tract infection 11/04/2016   Past Surgical History:  Procedure Laterality Date  . ABDOMINAL HYSTERECTOMY     partial  . APPENDECTOMY    . BREAST LUMPECTOMY Left 2002  . CHOLECYSTECTOMY  Age 30 or 27  . COLONOSCOPY  2017  . EYE SURGERY Left    cataract  . LEFT HEART CATH AND CORONARY ANGIOGRAPHY N/A 02/09/2019   Procedure: LEFT HEART CATH AND CORONARY ANGIOGRAPHY;  Surgeon: Nelva Bush, MD;  Location: [facility];  Service: Cardiovascular;  Laterality: N/A;  . TONSILLECTOMY    . TOTAL HIP ARTHROPLASTY Left 02/13/2019   Procedure: TOTAL HIP ARTHROPLASTY ANTERIOR APPROACH;  Surgeon: Paralee Cancel, MD;  Location: [facility];  Service: Orthopedics;  Laterality: Left;  70 mins     A IV Location/Drains/Wounds Patient Lines/Drains/Airways Status   Active Line/Drains/Airways    Name:   Placement date:   Placement time:   Site:   Days:   Peripheral IV 02/22/19 Right Forearm   02/22/19    2035  Forearm   less than 1   Incision (Closed) 02/13/19 Hip Left   02/13/19    1323     9          Intake/Output Last 24 hours  Intake/Output Summary (Last 24 hours) at 02/22/2019 2246 Last data filed at 02/22/2019 2148 Gross per 24 hour  Intake -  Output 200 ml  Net -200 ml    Labs/Imaging Results for orders placed or performed during the hospital encounter of 02/22/19 (from the past 48 hour(s))  Basic metabolic panel     Status: Abnormal   Collection Time: 02/22/19  8:36 PM  Result Value Ref Range   Sodium 130 (L) 135 - 145 mmol/L   Potassium 4.1 3.5 - 5.1 mmol/L   Chloride 91 (L) 98 - 111 mmol/L   CO2 26 22 - 32 mmol/L   Glucose, Bld 128  (H) 70 - 99 mg/dL   BUN 21 8 - 23 mg/dL   Creatinine, Ser 1.00 0.44 - 1.00 mg/dL   Calcium 8.4 (L) 8.9 - 10.3 mg/dL   GFR calc non Af Amer 54 (L) >60 mL/min   GFR calc Af Amer >60 >60 mL/min   Anion gap 13 5 - 15    Comment: Performed at [facility], [address] [place], [place] [address]  CBC     Status: Abnormal   Collection Time: 02/22/19  8:36 PM  Result Value Ref Range   WBC 6.8 4.0 - 10.5 K/uL   RBC 3.28 (L) 3.87 - 5.11 MIL/uL   Hemoglobin 9.5 (L) 12.0 - 15.0 g/dL   HCT 28.8 (L) 36.0 - 46.0 %   MCV 87.8 80.0 - 100.0 fL   MCH 29.0 26.0 - 34.0 pg   MCHC 33.0 30.0 - 36.0 g/dL   RDW 14.3 11.5 - 15.5 %   Platelets 265 150 - 400 K/uL   nRBC 0.0 0.0 - 0.2 %    Comment: Performed at [facility], [address] [place], [place] [address]  Troponin I (High Sensitivity)     Status: None   Collection Time: 02/22/19  8:36 PM  Result Value Ref Range   Troponin I (High Sensitivity) 6 <18 ng/L    Comment: (NOTE) Elevated high sensitivity troponin I (hsTnI) values and significant  changes across serial measurements may suggest ACS but many other  chronic and acute conditions are known to elevate hsTnI results.  Refer to the "Links" section for chest pain algorithms and additional  guidance. Performed at [facility], [address] [place], [place] [address]   Brain natriuretic peptide     Status: Abnormal   Collection Time: 02/22/19  8:36 PM  Result Value Ref Range   B Natriuretic Peptide 310.7 (H) 0.0 - 100.0 pg/mL    Comment: Performed at [facility], [address] [place], [place] [address]  Urinalysis, Routine w reflex microscopic     Status: Abnormal   Collection Time: 02/22/19  8:36 PM  Result Value Ref Range   Color, Urine YELLOW YELLOW   APPearance CLEAR CLEAR   Specific Gravity, Urine <1.005 (L) 1.005 - 1.030   pH 7.5 5.0 - 8.0   Glucose, UA NEGATIVE NEGATIVE mg/dL   Hgb urine dipstick NEGATIVE NEGATIVE   Bilirubin  Urine NEGATIVE NEGATIVE   Ketones, ur NEGATIVE NEGATIVE mg/dL   Protein, ur NEGATIVE NEGATIVE mg/dL   Nitrite NEGATIVE NEGATIVE   Leukocytes,Ua NEGATIVE NEGATIVE  Comment: Microscopic not done on urines with negative protein, blood, leukocytes, nitrite, or glucose < 500 mg/dL. Performed at [facility], [address] [place], [place] [address]   Troponin I (High Sensitivity)     Status: None   Collection Time: 02/22/19  9:51 PM  Result Value Ref Range   Troponin I (High Sensitivity) 7 <18 ng/L    Comment: (NOTE) Elevated high sensitivity troponin I (hsTnI) values and significant  changes across serial measurements may suggest ACS but many other  chronic and acute conditions are known to elevate hsTnI results.  Refer to the "Links" section for chest pain algorithms and additional  guidance. Performed at [facility], [address] [place], [place] [address]    Dg Chest 2 View  Result Date: 02/22/2019 CLINICAL DATA:  Initial evaluation for acute chest pain, shortness of breath. History of CHF. EXAM: CHEST - 2 VIEW COMPARISON:  Prior radiograph from 02/17/2019. FINDINGS: Cardiomegaly, stable. Mediastinal silhouette within normal limits. Aortic atherosclerosis. Lungs normally inflated. Scattered parenchymal scarring noted within the mid and lower lungs bilaterally, similar to previous. Underlying pulmonary vascular congestion with mild diffuse interstitial prominence, suggesting mild pulmonary interstitial edema. No pleural effusion. No focal infiltrates. No pneumothorax. No acute osseous finding. Thoracolumbar scoliosis noted. IMPRESSION: 1. Cardiomegaly with mild diffuse pulmonary interstitial edema, suggesting CHF. 2. Underlying scattered parenchymal scarring, unchanged. 3. Aortic atherosclerosis. Electronically Signed   By: Jeannine Boga M.D.   On: 02/22/2019 20:31   Ct Head Wo Contrast  Result Date: 02/22/2019 CLINICAL DATA:  78 year old female with  acute headache. EXAM: CT HEAD WITHOUT CONTRAST TECHNIQUE: Contiguous axial images were obtained from the base of the skull through the vertex without intravenous contrast. COMPARISON:  Head CT dated 02/15/2019 FINDINGS: Brain: Mild age-related atrophy and chronic microvascular ischemic changes. There is no acute intracranial hemorrhage. No mass effect or midline shift. No extra-axial fluid collection. Vascular: No hyperdense vessel or unexpected calcification. Skull: Normal. Negative for fracture or focal lesion. Sinuses/Orbits: No acute finding. Other: None IMPRESSION: 1. No acute intracranial hemorrhage. 2. Mild age-related atrophy and chronic microvascular ischemic changes. Electronically Signed   By: Anner Crete M.D.   On: 02/22/2019 20:28    Pending Labs Unresulted Labs (From admission, onward)    Start     Ordered   02/22/19 2140  SARS CORONAVIRUS 2 (TAT 6-24 HRS) Nasopharyngeal Nasopharyngeal Swab  (Asymptomatic/Tier 2 Patients Labs)  Once,   STAT    Question Answer Comment  Is this test for diagnosis or screening Screening   Symptomatic for COVID-19 as defined by CDC No   Hospitalized for COVID-19 No   Admitted to ICU for COVID-19 No   Previously tested for COVID-19 No   Resident in a congregate (group) care setting No   Employed in healthcare setting No   Pregnant No      02/22/19 2139          Vitals/Pain Today's Vitals   02/22/19 2115 02/22/19 2130 02/22/19 2145 02/22/19 2230  BP:  (!) 133/55  113/69  Pulse: 61 (!) 59  (!) 58  Resp: (!) 21   20  Temp:      TempSrc:      SpO2: 91% 91%  93%  Weight:      PainSc:   4      Isolation Precautions No active isolations  Medications Medications  sodium chloride flush (NS) 0.9 % injection 3 mL (3 mLs Intravenous Given 02/22/19 2037)  furosemide (LASIX) injection 40 mg (40 mg Intravenous Given 02/22/19 2053)  fentaNYL (SUBLIMAZE) injection 25 mcg (25 mcg Intravenous Given 02/22/19 2101)  diphenhydrAMINE (BENADRYL)  injection 12.5 mg (12.5 mg Intravenous Given 02/22/19 2148)    Mobility walks with person assist Moderate fall risk   Focused Assessments Cardiac Assessment Handoff:    Lab Results  Component Value Date   TROPONINI <0.03 06/08/2018   No results found for: DDIMER Does the Patient currently have chest pain? No     R Recommendations: See Admitting Provider Note  Report given to:   Additional Notes: A&O  Pleasant Affect  Daughter at Bedside who helps care for Pt.  Recent L Hip Replacement with clean dry dressing in tact

## 2019-02-22 NOTE — ED Notes (Signed)
PLEASE CALL RN AT [facility] TO LET HER KNOW SHE IS ON HER WAY ONCE CARELINK PICKS HER UP [id number]

## 2019-02-22 NOTE — ED Triage Notes (Signed)
Pt is here for CP and sob.  Pt was d/c 2 days ago after being admitted for CHF exacerbation.  Pt reports lack of energy and HA with this also, she has not taken any medication pta.

## 2019-02-22 NOTE — ED Notes (Signed)
ED Provider at bedside. 

## 2019-02-22 NOTE — ED Provider Notes (Signed)
[facility] Provider Note   CSN: [id number] Arrival date & time: 02/22/19  1906     History   Chief Complaint Chief Complaint  Patient presents with  . Chest Pain    HPI Julie Wilkerson is a 78 y.o. female.     Patient is a 78 year old female with a history of IBS, hypertension, prior breast cancer who presents with chest pain and shortness of breath.  She had a recent hip surgery.  Subsequently she was admitted the hospital for shortness of breath and was found to be fluid overloaded with significant anemia.  She was diuresed and given 1 unit of packed red cells.  She was discharged on September 28.  She said she felt pretty good when she left the hospital but has progressively gotten more short of breath and today was more short of breath.  She is also having some pain in the center of her chest which is the same type of pain she had when she was first admitted a few days ago.  She woke up this morning with a severe headache.  She says she has had headaches in the past but this is worse than her typical headaches.  Its all around her head.  She has had some associated nausea but no vomiting.  No known fevers.  She has had a cough which is been going on for the last few days but gotten a little worse.  She has a little bit of increase in leg swelling.     Past Medical History:  Diagnosis Date  . Anemia   . Anginal pain ([place])   . Anxiety   . Arthritis   . Asthma   . Breast cancer ([place])    2002, left, encapsulated, microcalcifications. lumpectomy, radtiation x 30  . Breast cancer in female [facility])   . Change in mole 06/08/2016  . Colitis   . Complication of anesthesia    VERY SENSITIVE TO ANESTHESIA   . Decreased visual acuity 01/16/2017  . Depression   . Dyspnea 11/04/2016   with asthma exacerbation only  . Dysuria 11/04/2016  . Family history of adverse reaction to anesthesia   . Gluten intolerance   . History of chicken pox   . History of  Helicobacter pylori infection 08/01/2015  . Hyperlipidemia   . Hypertension   . Hypothyroid   . IBS (irritable bowel syndrome)   . Left leg pain 03/01/2017  . Low back pain 08/10/2015  . Neck pain 03/01/2017  . Osteoporosis   . Personal history of radiation therapy   . Pneumonia   . PONV (postoperative nausea and vomiting)   . RLQ discomfort 03/01/2017  . Stomach cramps   . Urinary tract infection 11/04/2016    Patient Active Problem List   Diagnosis Date Noted  . Acute CHF (congestive heart failure) ([place]) 02/22/2019  . CAP (community acquired pneumonia) 02/16/2019  . Sepsis due to pneumonia ([place]) 02/16/2019  . Acute blood loss anemia 02/16/2019  . Acute diastolic CHF (congestive heart failure) ([place]) 02/16/2019  . Hyponatremia 02/16/2019  . Acute respiratory failure with hypoxia ([place]) 02/16/2019  . Overweight (BMI 25.0-29.9) 02/14/2019  . 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 Accelerating angina ([place]) 02/09/2019  . Atypical chest pain 02/04/2019  . Bursitis of left hip 09/04/2018  . Pelvic pain 07/02/2018  . Left shoulder pain 05/07/2018  . Left hand pain 05/07/2018  . Change in bowel habits 02/22/2018  . Nausea and vomiting 02/22/2018  .  Poor appetite 02/22/2018  . Anemia 01/17/2018  . Constipation 01/17/2018  . Urinary frequency 12/22/2017  . Right hip pain 10/23/2017  . Elevated sed rate 10/23/2017  . Influenza due to identified influenza virus 09/04/2017  . Bronchitis 04/26/2017  . Acute pansinusitis 04/26/2017  . Memory loss 03/28/2017  . Bilateral carotid bruits 03/28/2017  . Left hip pain 03/01/2017  . Neck pain 03/01/2017  . RLQ discomfort 03/01/2017  . Decreased visual acuity 01/16/2017  . 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 Hyperglycemia 11/07/2016  . 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 Abdominal pain 11/07/2016  . Urinary tract infection 11/04/2016  . Change in mole 06/08/2016  . Chronic bilateral thoracic back pain 05/23/2016   . Vitamin D deficiency 04/29/2016  . Tremor 02/15/2016  . Diarrhea 11/16/2015  . Low back pain 08/10/2015  . History of Helicobacter pylori infection 08/01/2015  . Cough variant asthma  vs UACS from ACEi    . Breast cancer in female [facility])   . Hyperlipidemia   . Essential hypertension   . Osteoporosis   . Hypothyroid   . IBS (irritable bowel syndrome)   . Gluten intolerance   . History of chicken pox     Past Surgical History:  Procedure Laterality Date  . ABDOMINAL HYSTERECTOMY     partial  . APPENDECTOMY    . BREAST LUMPECTOMY Left 2002  . CHOLECYSTECTOMY  Age 42 or 81  . COLONOSCOPY  2017  . EYE SURGERY Left    cataract  . LEFT HEART CATH AND CORONARY ANGIOGRAPHY N/A 02/09/2019   Procedure: LEFT HEART CATH AND CORONARY ANGIOGRAPHY;  Surgeon: Nelva Bush, MD;  Location: [facility];  Service: Cardiovascular;  Laterality: N/A;  . TONSILLECTOMY    . TOTAL HIP ARTHROPLASTY Left 02/13/2019   Procedure: TOTAL HIP ARTHROPLASTY ANTERIOR APPROACH;  Surgeon: Paralee Cancel, MD;  Location: [facility];  Service: Orthopedics;  Laterality: Left;  70 mins     OB History    Gravida  4   Para  3   Term  3   Preterm      AB  1   Living  3     SAB  1   TAB      Ectopic      Multiple      Live Births  3            Home Medications    Prior to Admission medications   Medication Sig Start Date End Date Taking? Authorizing Provider  aspirin (ASPIRIN CHILDRENS) 81 MG chewable tablet Chew 1 tablet (81 mg total) by mouth 2 (two) times daily. Take for 4 weeks, then resume regular dose. 02/14/19 03/16/19 Yes Babish, Rodman Key, PA-C  atorvastatin (LIPITOR) 20 MG tablet Take 1 tablet (20 mg total) by mouth daily. 02/12/19 05/13/19 Yes Tobb, Kardie, DO  budesonide-formoterol (SYMBICORT) 160-4.5 MCG/ACT inhaler Inhale 2 puffs into the lungs 2 (two) times daily as needed (wheezing).   Yes [provider]  docusate sodium (COLACE) 100 MG capsule Take 1 capsule (100 mg  total) by mouth 2 (two) times daily. 02/14/19  Yes Babish, Rodman Key, PA-C  famotidine (PEPCID) 20 MG tablet Take 1 tablet (20 mg total) by mouth 2 (two) times daily. Patient taking differently: Take 20 mg by mouth daily.  11/22/18  Yes Armbruster, Carlota Raspberry, MD  ferrous sulfate (FERROUSUL) 325 (65 FE) MG tablet Take 1 tablet (325 mg total) by mouth 3 (three) times daily with meals for  14 days. 02/14/19 02/28/19 Yes Babish, Rodman Key, PA-C  fluticasone (FLOVENT HFA) 110 MCG/ACT inhaler Inhale 1 puff into the lungs 2 (two) times daily. Patient taking differently: Inhale 1 puff into the lungs 2 (two) times daily as needed (shortness of breath or wheezing).  10/22/18  Yes [name], MD  furosemide (LASIX) 20 MG tablet Take 1 tablet (20 mg total) by mouth daily. 02/19/19 02/19/20 Yes Georgette Shell, MD  hyoscyamine (LEVSIN SL) 0.125 MG SL tablet Place 1 tablet (0.125 mg total) under the tongue every 4 (four) hours as needed for cramping. 09/04/18  Yes [name], MD  levothyroxine (SYNTHROID, LEVOTHROID) 50 MCG tablet Take 1 tablet (50 mcg total) by mouth daily. 09/04/18  Yes [name], MD  metoprolol succinate (TOPROL-XL) 25 MG 24 hr tablet Take 1 tablet (25 mg total) by mouth daily. Patient taking differently: Take 25 mg by mouth at bedtime.  09/04/18  Yes [name], MD  polyethylene glycol (MIRALAX / GLYCOLAX) 17 g packet Take 17 g by mouth 2 (two) times daily. 02/14/19  Yes Babish, Rodman Key, PA-C  potassium chloride (K-DUR) 10 MEQ tablet Take 1 tablet (10 mEq total) by mouth daily. 02/19/19  Yes Georgette Shell, MD  primidone (MYSOLINE) 50 MG tablet Take 1 tablet (50 mg total) by mouth at bedtime. 09/19/18  Yes Tat, Eustace Quail, DO  sucralfate (CARAFATE) 1 g tablet Take 1 tablet (1 g total) by mouth every 6 (six) hours as needed. Patient taking differently: Take 1 g by mouth every 6 (six) hours as needed. Stomach ulcers 11/22/18  Yes Armbruster, Carlota Raspberry, MD  methocarbamol (ROBAXIN) 500 MG  tablet Take 1 tablet (500 mg total) by mouth every 6 (six) hours as needed for muscle spasms. 02/14/19   Danae Orleans, PA-C  omega-3 acid ethyl esters (LOVAZA) 1 g capsule Take 2 g by mouth daily.    [provider]  Probiotic Product (PROBIOTIC DAILY PO) Take 1 tablet by mouth daily.     [provider]  senna-docusate (SENOKOT-S) 8.6-50 MG tablet Take 2 tablets by mouth 2 (two) times daily. 02/19/19   Georgette Shell, MD    Family History Family History  Problem Relation Age of Onset  . Colitis Mother   . Irritable bowel syndrome Mother   . Hypertension Father   . Hyperlipidemia Brother   . Heart attack Brother   . Stomach cancer Paternal Grandmother 46  . Hyperlipidemia Brother   . Heart attack Brother   . Hyperlipidemia Brother   . Heart attack Brother   . Hyperlipidemia Sister   . Leukemia Daughter   . Arthritis Daughter   . Heart disease Daughter        ASD vs VSD  . Colon cancer Neg Hx     Social History Social History   Tobacco Use  . Smoking status: Never Smoker  . Smokeless tobacco: Never Used  Substance Use Topics  . Alcohol use: Never    Alcohol/week: 0.0 standard drinks    Frequency: Never  . Drug use: Never     Allergies   Patient has no known allergies.   Review of Systems Review of Systems  Constitutional: Positive for fatigue. Negative for chills, diaphoresis and fever.  HENT: Negative for congestion, rhinorrhea and sneezing.   Eyes: Negative.   Respiratory: Positive for cough, chest tightness and shortness of breath.   Cardiovascular: Positive for chest pain. Negative for leg swelling.  Gastrointestinal: Positive for nausea. Negative for  abdominal pain, blood in stool, diarrhea and vomiting.  Genitourinary: Negative for difficulty urinating, flank pain, frequency and hematuria.  Musculoskeletal: Negative for arthralgias and back pain.  Skin: Negative for rash.  Neurological: Positive for headaches. Negative for  dizziness, speech difficulty, weakness and numbness.     Physical Exam Updated Vital Signs BP (!) 125/58   Pulse 60   Temp 98 F (36.7 C) (Oral)   Resp 16   Wt 61.8 kg   SpO2 95%   BMI 28.47 kg/m   Physical Exam Constitutional:      Appearance: She is well-developed.  HENT:     Head: Normocephalic and atraumatic.  Eyes:     Pupils: Pupils are equal, round, and reactive to light.  Neck:     Musculoskeletal: Normal range of motion and neck supple.     Comments: No meningismus Cardiovascular:     Rate and Rhythm: Normal rate and regular rhythm.     Heart sounds: Normal heart sounds.  Pulmonary:     Effort: Pulmonary effort is normal. No respiratory distress.     Breath sounds: Normal breath sounds. No wheezing or rales.  Chest:     Chest wall: No tenderness.  Abdominal:     General: Bowel sounds are normal.     Palpations: Abdomen is soft.     Tenderness: There is no abdominal tenderness. There is no guarding or rebound.  Musculoskeletal: Normal range of motion.     Right lower leg: Edema present.     Left lower leg: Edema present.     Comments: 2+ pitting edema to the bilateral lower extremities  Lymphadenopathy:     Cervical: No cervical adenopathy.  Skin:    General: Skin is warm and dry.     Findings: No rash.  Neurological:     Mental Status: She is alert and oriented to person, place, and time.      ED Treatments / Results  Labs (all labs ordered are listed, but only abnormal results are displayed) Labs Reviewed  BASIC METABOLIC PANEL - Abnormal; Notable for the following components:      Result Value   Sodium 130 (*)    Chloride 91 (*)    Glucose, Bld 128 (*)    Calcium 8.4 (*)    GFR calc non Af Amer 54 (*)    All other components within normal limits  CBC - Abnormal; Notable for the following components:   RBC 3.28 (*)    Hemoglobin 9.5 (*)    HCT 28.8 (*)    All other components within normal limits  BRAIN NATRIURETIC PEPTIDE - Abnormal;  Notable for the following components:   B Natriuretic Peptide 310.7 (*)    All other components within normal limits  URINALYSIS, ROUTINE W REFLEX MICROSCOPIC - Abnormal; Notable for the following components:   Specific Gravity, Urine <1.005 (*)    All other components within normal limits  SARS CORONAVIRUS 2 (TAT 6-24 HRS)  TROPONIN I (HIGH SENSITIVITY)  TROPONIN I (HIGH SENSITIVITY)    EKG None   ED ECG REPORT   Date: 02/22/2019  Rate: 66  Rhythm: normal sinus rhythm  QRS Axis: normal  Intervals: normal  ST/T Wave abnormalities: nonspecific ST/T changes  Conduction Disutrbances:none  Narrative Interpretation:   Old EKG Reviewed: unchanged  I have personally reviewed the EKG tracing and agree with the computerized printout as noted.   Radiology Dg Chest 2 View  Result Date: 02/22/2019 CLINICAL DATA:  Initial evaluation for  acute chest pain, shortness of breath. History of CHF. EXAM: CHEST - 2 VIEW COMPARISON:  Prior radiograph from 02/17/2019. FINDINGS: Cardiomegaly, stable. Mediastinal silhouette within normal limits. Aortic atherosclerosis. Lungs normally inflated. Scattered parenchymal scarring noted within the mid and lower lungs bilaterally, similar to previous. Underlying pulmonary vascular congestion with mild diffuse interstitial prominence, suggesting mild pulmonary interstitial edema. No pleural effusion. No focal infiltrates. No pneumothorax. No acute osseous finding. Thoracolumbar scoliosis noted. IMPRESSION: 1. Cardiomegaly with mild diffuse pulmonary interstitial edema, suggesting CHF. 2. Underlying scattered parenchymal scarring, unchanged. 3. Aortic atherosclerosis. Electronically Signed   By: Jeannine Boga M.D.   On: 02/22/2019 20:31   Ct Head Wo Contrast  Result Date: 02/22/2019 CLINICAL DATA:  78 year old female with acute headache. EXAM: CT HEAD WITHOUT CONTRAST TECHNIQUE: Contiguous axial images were obtained from the base of the skull through the  vertex without intravenous contrast. COMPARISON:  Head CT dated 02/15/2019 FINDINGS: Brain: Mild age-related atrophy and chronic microvascular ischemic changes. There is no acute intracranial hemorrhage. No mass effect or midline shift. No extra-axial fluid collection. Vascular: No hyperdense vessel or unexpected calcification. Skull: Normal. Negative for fracture or focal lesion. Sinuses/Orbits: No acute finding. Other: None IMPRESSION: 1. No acute intracranial hemorrhage. 2. Mild age-related atrophy and chronic microvascular ischemic changes. Electronically Signed   By: Anner Crete M.D.   On: 02/22/2019 20:28    Procedures Procedures (including critical care time)  Medications Ordered in ED Medications  sodium chloride flush (NS) 0.9 % injection 3 mL (3 mLs Intravenous Given 02/22/19 2037)  furosemide (LASIX) injection 40 mg (40 mg Intravenous Given 02/22/19 2053)  fentaNYL (SUBLIMAZE) injection 25 mcg (25 mcg Intravenous Given 02/22/19 2101)  diphenhydrAMINE (BENADRYL) injection 12.5 mg (12.5 mg Intravenous Given 02/22/19 2148)     Initial Impression / Assessment and Plan / ED Course  I have reviewed the triage vital signs and the nursing notes.  Pertinent labs & imaging results that were available during my care of the patient were reviewed by me and considered in my medical decision making (see chart for details).        Patient is a 78 year old female who presents with shortness of breath with some associated chest pain.  She also has a bad headache.  She has had headaches in the past but this is worse than her typical headaches.  CT scan shows no acute abnormalities.  She is neurologically intact.  Her chest x-ray shows evidence of pulmonary edema and her BNP is elevated.  Her troponin is negative.  She does not have any ischemic changes on EKG.  She has some anemia with a hemoglobin of 9.5 but is actually better than it was during her recent hospitalization.  She was diuresed here in  the emergency department and is feeling a little bit better but still short of breath with any movement and she desats with minimal movement down into the upper 80s.  She was placed on nasal cannula at 2 L/min.  I will consult hospitalist for admission.  I spoke with Dr. Jonelle Sidle who will admit the pt.  Final Clinical Impressions(s) / ED Diagnoses   Final diagnoses:  Acute on chronic congestive heart failure, unspecified heart failure type [facility])    ED Discharge Orders    None       Malvin Johns, MD 02/22/19 2315

## 2019-02-23 ENCOUNTER — Other Ambulatory Visit: Payer: Self-pay

## 2019-02-23 ENCOUNTER — Telehealth: Payer: Self-pay | Admitting: *Deleted

## 2019-02-23 DIAGNOSIS — Z79890 Hormone replacement therapy (postmenopausal): Secondary | ICD-10-CM | POA: Diagnosis not present

## 2019-02-23 DIAGNOSIS — I2510 Atherosclerotic heart disease of native coronary artery without angina pectoris: Secondary | ICD-10-CM | POA: Diagnosis not present

## 2019-02-23 DIAGNOSIS — M1990 Unspecified osteoarthritis, unspecified site: Secondary | ICD-10-CM | POA: Diagnosis present

## 2019-02-23 DIAGNOSIS — K589 Irritable bowel syndrome without diarrhea: Secondary | ICD-10-CM | POA: Diagnosis not present

## 2019-02-23 DIAGNOSIS — Z79899 Other long term (current) drug therapy: Secondary | ICD-10-CM | POA: Diagnosis not present

## 2019-02-23 DIAGNOSIS — Z7951 Long term (current) use of inhaled steroids: Secondary | ICD-10-CM | POA: Diagnosis not present

## 2019-02-23 DIAGNOSIS — J45909 Unspecified asthma, uncomplicated: Secondary | ICD-10-CM | POA: Diagnosis not present

## 2019-02-23 DIAGNOSIS — Z8349 Family history of other endocrine, nutritional and metabolic diseases: Secondary | ICD-10-CM | POA: Diagnosis not present

## 2019-02-23 DIAGNOSIS — E785 Hyperlipidemia, unspecified: Secondary | ICD-10-CM | POA: Diagnosis not present

## 2019-02-23 DIAGNOSIS — I110 Hypertensive heart disease with heart failure: Secondary | ICD-10-CM | POA: Diagnosis not present

## 2019-02-23 DIAGNOSIS — F419 Anxiety disorder, unspecified: Secondary | ICD-10-CM | POA: Diagnosis present

## 2019-02-23 DIAGNOSIS — Z9861 Coronary angioplasty status: Secondary | ICD-10-CM | POA: Diagnosis not present

## 2019-02-23 DIAGNOSIS — I10 Essential (primary) hypertension: Secondary | ICD-10-CM | POA: Diagnosis not present

## 2019-02-23 DIAGNOSIS — Z8701 Personal history of pneumonia (recurrent): Secondary | ICD-10-CM | POA: Diagnosis not present

## 2019-02-23 DIAGNOSIS — I5033 Acute on chronic diastolic (congestive) heart failure: Secondary | ICD-10-CM

## 2019-02-23 DIAGNOSIS — I509 Heart failure, unspecified: Secondary | ICD-10-CM

## 2019-02-23 DIAGNOSIS — R69 Illness, unspecified: Secondary | ICD-10-CM | POA: Diagnosis not present

## 2019-02-23 DIAGNOSIS — E039 Hypothyroidism, unspecified: Secondary | ICD-10-CM | POA: Diagnosis not present

## 2019-02-23 DIAGNOSIS — Z853 Personal history of malignant neoplasm of breast: Secondary | ICD-10-CM | POA: Diagnosis not present

## 2019-02-23 DIAGNOSIS — Z7982 Long term (current) use of aspirin: Secondary | ICD-10-CM | POA: Diagnosis not present

## 2019-02-23 DIAGNOSIS — Z20828 Contact with and (suspected) exposure to other viral communicable diseases: Secondary | ICD-10-CM | POA: Diagnosis not present

## 2019-02-23 DIAGNOSIS — M810 Age-related osteoporosis without current pathological fracture: Secondary | ICD-10-CM | POA: Diagnosis not present

## 2019-02-23 DIAGNOSIS — Z923 Personal history of irradiation: Secondary | ICD-10-CM | POA: Diagnosis not present

## 2019-02-23 DIAGNOSIS — Z8249 Family history of ischemic heart disease and other diseases of the circulatory system: Secondary | ICD-10-CM | POA: Diagnosis not present

## 2019-02-23 DIAGNOSIS — Z87440 Personal history of urinary (tract) infections: Secondary | ICD-10-CM | POA: Diagnosis not present

## 2019-02-23 HISTORY — DX: Acute on chronic diastolic (congestive) heart failure: I50.33

## 2019-02-23 LAB — BASIC METABOLIC PANEL
Anion gap: 13 (ref 5–15)
BUN: 23 mg/dL (ref 8–23)
CO2: 25 mmol/L (ref 22–32)
Calcium: 8.9 mg/dL (ref 8.9–10.3)
Chloride: 93 mmol/L — ABNORMAL LOW (ref 98–111)
Creatinine, Ser: 1.18 mg/dL — ABNORMAL HIGH (ref 0.44–1.00)
GFR calc Af Amer: 52 mL/min — ABNORMAL LOW (ref >60)
GFR calc non Af Amer: 44 mL/min — ABNORMAL LOW (ref >60)
Glucose, Bld: 97 mg/dL (ref 70–99)
Potassium: 4.1 mmol/L (ref 3.5–5.1)
Sodium: 131 mmol/L — ABNORMAL LOW (ref 135–145)

## 2019-02-23 LAB — SARS CORONAVIRUS 2 (TAT 6-24 HRS): SARS Coronavirus 2: NEGATIVE

## 2019-02-23 MED ORDER — FERROUS SULFATE 325 (65 FE) MG PO TABS
325.0000 mg | ORAL_TABLET | Freq: Three times a day (TID) | ORAL | Status: DC
  Administered 2019-02-23 – 2019-02-24 (×4): 325 mg via ORAL
  Filled 2019-02-23 (×4): qty 1

## 2019-02-23 MED ORDER — ONDANSETRON HCL 4 MG/2ML IJ SOLN: 4.0000 mg | Freq: Four times a day (QID) | INTRAMUSCULAR | Status: DC | PRN

## 2019-02-23 MED ORDER — FUROSEMIDE 10 MG/ML IJ SOLN
40.0000 mg | Freq: Every day | INTRAMUSCULAR | Status: DC
  Administered 2019-02-23 – 2019-02-24 (×2): 40 mg via INTRAVENOUS
  Filled 2019-02-23 (×2): qty 4

## 2019-02-23 MED ORDER — SODIUM CHLORIDE 0.9% FLUSH
3.0000 mL | Freq: Two times a day (BID) | INTRAVENOUS | Status: DC
  Administered 2019-02-23 – 2019-02-24 (×4): 3 mL via INTRAVENOUS

## 2019-02-23 MED ORDER — DOCUSATE SODIUM 100 MG PO CAPS
100.0000 mg | ORAL_CAPSULE | Freq: Two times a day (BID) | ORAL | Status: DC
  Administered 2019-02-23 – 2019-02-24 (×3): 100 mg via ORAL
  Filled 2019-02-23 (×3): qty 1

## 2019-02-23 MED ORDER — METHOCARBAMOL 500 MG PO TABS
500.0000 mg | ORAL_TABLET | Freq: Four times a day (QID) | ORAL | Status: DC | PRN
  Administered 2019-02-23: 500 mg via ORAL
  Filled 2019-02-23: qty 1

## 2019-02-23 MED ORDER — OMEGA-3-ACID ETHYL ESTERS 1 G PO CAPS
2.0000 g | ORAL_CAPSULE | Freq: Every day | ORAL | Status: DC
  Administered 2019-02-23 – 2019-02-24 (×2): 2 g via ORAL
  Filled 2019-02-23 (×2): qty 2

## 2019-02-23 MED ORDER — POLYETHYLENE GLYCOL 3350 17 G PO PACK
17.0000 g | PACK | Freq: Two times a day (BID) | ORAL | Status: DC
  Administered 2019-02-23 – 2019-02-24 (×3): 17 g via ORAL
  Filled 2019-02-23 (×3): qty 1

## 2019-02-23 MED ORDER — MOMETASONE FURO-FORMOTEROL FUM 200-5 MCG/ACT IN AERO
2.0000 | INHALATION_SPRAY | Freq: Two times a day (BID) | RESPIRATORY_TRACT | Status: DC
  Administered 2019-02-23 – 2019-02-24 (×3): 2 via RESPIRATORY_TRACT
  Filled 2019-02-23: qty 8.8

## 2019-02-23 MED ORDER — IBUPROFEN 200 MG PO TABS
400.0000 mg | ORAL_TABLET | Freq: Four times a day (QID) | ORAL | Status: DC | PRN
  Administered 2019-02-23: 400 mg via ORAL
  Filled 2019-02-23: qty 2

## 2019-02-23 MED ORDER — LISINOPRIL 5 MG PO TABS
5.0000 mg | ORAL_TABLET | Freq: Every day | ORAL | Status: DC
  Administered 2019-02-24: 5 mg via ORAL
  Filled 2019-02-23 (×2): qty 1

## 2019-02-23 MED ORDER — ATORVASTATIN CALCIUM 10 MG PO TABS
20.0000 mg | ORAL_TABLET | Freq: Every day | ORAL | Status: DC
  Administered 2019-02-23 – 2019-02-24 (×2): 20 mg via ORAL
  Filled 2019-02-23 (×2): qty 2

## 2019-02-23 MED ORDER — SODIUM CHLORIDE 0.9% FLUSH: 3.0000 mL | INTRAVENOUS | Status: DC | PRN

## 2019-02-23 MED ORDER — ACETAMINOPHEN 325 MG PO TABS
650.0000 mg | ORAL_TABLET | ORAL | Status: DC | PRN
  Administered 2019-02-24 (×2): 650 mg via ORAL
  Filled 2019-02-23 (×2): qty 2

## 2019-02-23 MED ORDER — PRIMIDONE 50 MG PO TABS
50.0000 mg | ORAL_TABLET | Freq: Every day | ORAL | Status: DC
  Administered 2019-02-23: 50 mg via ORAL
  Filled 2019-02-23 (×2): qty 1

## 2019-02-23 MED ORDER — SENNOSIDES-DOCUSATE SODIUM 8.6-50 MG PO TABS
2.0000 | ORAL_TABLET | Freq: Two times a day (BID) | ORAL | Status: DC
  Administered 2019-02-23 – 2019-02-24 (×3): 2 via ORAL
  Filled 2019-02-23 (×3): qty 2

## 2019-02-23 MED ORDER — SODIUM CHLORIDE 0.9 % IV SOLN: 250.0000 mL | INTRAVENOUS | Status: DC | PRN

## 2019-02-23 MED ORDER — HYOSCYAMINE SULFATE 0.125 MG SL SUBL
0.1250 mg | SUBLINGUAL_TABLET | SUBLINGUAL | Status: DC | PRN
  Filled 2019-02-23: qty 1

## 2019-02-23 MED ORDER — BUDESONIDE 0.25 MG/2ML IN SUSP: 0.2500 mg | Freq: Two times a day (BID) | RESPIRATORY_TRACT | Status: DC

## 2019-02-23 MED ORDER — LEVOTHYROXINE SODIUM 50 MCG PO TABS
50.0000 ug | ORAL_TABLET | Freq: Every day | ORAL | Status: DC
  Administered 2019-02-23 – 2019-02-24 (×2): 50 ug via ORAL
  Filled 2019-02-23 (×2): qty 1

## 2019-02-23 MED ORDER — BUDESONIDE 0.25 MG/2ML IN SUSP: 0.2500 mg | Freq: Two times a day (BID) | RESPIRATORY_TRACT | Status: DC | PRN

## 2019-02-23 MED ORDER — POTASSIUM CHLORIDE CRYS ER 10 MEQ PO TBCR
10.0000 meq | EXTENDED_RELEASE_TABLET | Freq: Every day | ORAL | Status: DC
  Administered 2019-02-23 – 2019-02-24 (×2): 10 meq via ORAL
  Filled 2019-02-23 (×2): qty 1

## 2019-02-23 MED ORDER — METOPROLOL SUCCINATE ER 25 MG PO TB24
25.0000 mg | ORAL_TABLET | Freq: Every day | ORAL | Status: DC
  Administered 2019-02-23: 25 mg via ORAL
  Filled 2019-02-23: qty 1

## 2019-02-23 MED ORDER — ASPIRIN 81 MG PO CHEW
81.0000 mg | CHEWABLE_TABLET | Freq: Two times a day (BID) | ORAL | Status: DC
  Administered 2019-02-23 – 2019-02-24 (×3): 81 mg via ORAL
  Filled 2019-02-23 (×3): qty 1

## 2019-02-23 MED ORDER — FAMOTIDINE 20 MG PO TABS
20.0000 mg | ORAL_TABLET | Freq: Every day | ORAL | Status: DC
  Administered 2019-02-23 – 2019-02-24 (×2): 20 mg via ORAL
  Filled 2019-02-23 (×2): qty 1

## 2019-02-23 MED ORDER — ENOXAPARIN SODIUM 40 MG/0.4ML ~~LOC~~ SOLN
40.0000 mg | SUBCUTANEOUS | Status: DC
  Administered 2019-02-23 – 2019-02-24 (×2): 40 mg via SUBCUTANEOUS
  Filled 2019-02-23 (×2): qty 0.4

## 2019-02-23 NOTE — Progress Notes (Signed)
Please have Mrs. Ishihara see [name] next week while in [facility].

## 2019-02-23 NOTE — Progress Notes (Signed)
PROGRESS NOTE  Julie Wilkerson [id number] DOB: [birth year] DOA: 02/22/2019 PCP: [name], MD  Brief History   The patient is a 78 yr old woman who carries a past medical history significant for hypertension, Breast CA, anxiety, asthma, and diastolic CHF. On 02/06/2019 the patient underwent echocardiogram that demonstrated an EF of 123456 and diastolic dysfunction. She then underwent a LHC on 11/09/2018 that demonstrated mild to moderate coronary artery disease of the mid proximal, midLAD, Ostial and proximal LAD, and RCA. No wall motion abnormalities. These were done to investigate complaints of chest pain. On 02/13/2019 the patient was admitted for an elected hip arthroplasty. She tolerated the procedure well and was discharged to home on 02/14/2019. She was admitted on 02/15/2019 for shortness of breath. On that occasion she was hypotensive in the ED and developed cough. She was found to have acute hypoxic respiratory failure due to acute diastolic CHF. She was treated with IV diuresis. SaO2 on admission was 88%. BNP was elevated over 500 with crackles and evidence of pulmonary edema. She was discharged on 02/19/2019 when she was negative 3.3 L  On lasix 20 mg daily. She now presents on 02/22/2019 with complaints of shortness of breath. CXR demonstrated mild diffuse pulmonary interstitial edema suggesting CHF and cardiomegaly.   She has been admitted to a telemetry bed. She is being diuresed.   Consultants  . None  Procedures  . None  Antibiotics   Anti-infectives (From admission, onward)   None    .  Subjective  The patient states that she is feeling better, but states that she still feels short of breath.   Objective   Vitals:  Vitals:   02/23/19 0937 02/23/19 1145  BP:  106/84  Pulse: 74 70  Resp: 18 18  Temp:  98.2 F (36.8 C)  SpO2: 95% 93%   Exam:  Constitutional:  . The patient is awake, alert, and oriented x 3. Mild distress from shortness of breath.  Respiratory:  . No increased work of breathing. . No wheezes or rhonchi. Positive for mild crackles at bases bilaterally. . No tactile fremitus Cardiovascular:  . Regular rate and rhythm . No murmurs, ectopy, or gallups. . No lateral PMI. No thrills. Abdomen:  . Abdomen is soft, non-tender, non-distended . No hernias, masses, or organomegaly . Normoactive bowel sounds.  Musculoskeletal:  . No cyanosis, clubbing, or edema Skin:  . No rashes, lesions, ulcers . palpation of skin: no induration or nodules Neurologic:  . CN 2-12 intact . Sensation all 4 extremities intact Psychiatric:  . Mental status o Mood, affect appropriate o Orientation to person, place, time  . judgment and insight appear intact  I have personally reviewed the following:   Today's Data  . Vitals, BNP  Scheduled Meds: . aspirin  81 mg Oral BID  . atorvastatin  20 mg Oral Daily  . docusate sodium  100 mg Oral BID  . enoxaparin (LOVENOX) injection  40 mg Subcutaneous Q24H  . famotidine  20 mg Oral Daily  . ferrous sulfate  325 mg Oral TID WC  . furosemide  40 mg Intravenous Daily  . levothyroxine  50 mcg Oral Q0600  . lisinopril  5 mg Oral Daily  . metoprolol succinate  25 mg Oral QHS  . mometasone-formoterol  2 puff Inhalation BID  . omega-3 acid ethyl esters  2 g Oral Daily  . polyethylene glycol  17 g Oral BID  . potassium chloride  10 mEq Oral Daily  .  primidone  50 mg Oral QHS  . senna-docusate  2 tablet Oral BID  . sodium chloride flush  3 mL Intravenous Q12H   Continuous Infusions: . sodium chloride      Principal Problem:   Acute on chronic diastolic CHF (congestive heart failure) (HCC) Active Problems:   Essential hypertension   CHF (congestive heart failure) (HCC)   LOS: 1 day   A & P   Acute on chronic diastolic CHF: The patient is feeling a little better and is saturating well on room, air, but she continues to complain of shortness of breath. The patient will be ambulated on  room air. Will continue to aggressively diurese the patient as this is the patient's second admission since her hip surgery for exacerbation of her diastolic dysfunction. Each time she was able to remain home for only 2 days following discharge. I would like to achieve a dry weight on this patient and be able to determine the optimal dose of lasix for discharge to prevent further readmissions for this indication. She is on the CHF pathway. She is currently receiving 40 mg lasix IV daily. She is 3.9 L negative at this point. Her electrolytes, creatinine, and volume status will be followed daily. Two echocardiograms both from 01/2019 demonstrated a normal EF and diastolic dysfunction. The patient was COVID-19 negative upon this admission.  CAD: Non-obstructive, but multivessel on [facility] 02/09/2019. MIRO. Medical management by cardiology with metoprolol, lipitor, and aspirin. Monitor on telemetry.  HTN: Blood pressure well controlled on lisinopril and metoprolol.  Anxiety: Noted. May be playing a role in the patient's shortness of breath. Consider addition of an anxiolytic to the patient's home medications if continued diuresis to a dry weight does not resolve the patient's shortness of breath.  DVT prophylaxis: Lovenox Code Status: Full Family Communication: No family in room Disposition Plan: Home after admit Kanen Mottola, DO [facility] Direct contact: see [email or web address]  7PM-7AM contact night coverage as above 02/23/2019, 1:32 PM  LOS: 1 day

## 2019-02-23 NOTE — Plan of Care (Signed)
NURSING PROGRESS NOTE  Julie Wilkerson [id number] Admission Data: 02/23/2019 8:05 AM Attending Provider: Karie Kirks, DO [id number], Bonnita Levan, MD Code Status: full  Julie Wilkerson is a 78 y.o. female patient admitted from ED:  -No acute distress noted.  -No complaints of shortness of breath.  -No complaints of chest pain.   Cardiac Monitoring: Box # 15 in place. Cardiac monitor yields:normal sinus rhythm.  Blood pressure (!) 111/53, pulse (!) 56, temperature 98.5 F (36.9 C), temperature source Oral, resp. rate 15, height 4\' 10"  (1.473 m), weight 58.1 kg, SpO2 94 %.   IV Fluids:  IV in place, occlusive dsg intact without redness, IV cath forearm right, condition patent and no redness none.   Allergies:  Patient has no known allergies.  Past Medical History:   has a past medical history of Anemia, Anginal pain ([place]), Anxiety, Arthritis, Asthma, Breast cancer ([place]), Breast cancer in female [facility]), Change in mole (123456), Colitis, Complication of anesthesia, Decreased visual acuity (01/16/2017), Depression, Dyspnea (11/04/2016), Dysuria (11/04/2016), Family history of adverse reaction to anesthesia, Gluten intolerance, History of chicken pox, History of Helicobacter pylori infection (08/01/2015), Hyperlipidemia, Hypertension, Hypothyroid, IBS (irritable bowel syndrome), Left leg pain (03/01/2017), Low back pain (08/10/2015), Neck pain (03/01/2017), Osteoporosis, Personal history of radiation therapy, Pneumonia, PONV (postoperative nausea and vomiting), RLQ discomfort (03/01/2017), Stomach cramps, and Urinary tract infection (11/04/2016).  Past Surgical History:   has a past surgical history that includes Appendectomy; Breast lumpectomy (Left, 2002); Tonsillectomy; Abdominal hysterectomy; Colonoscopy (2017); Cholecystectomy (Age 42 or 9); Eye surgery (Left); LEFT HEART CATH AND CORONARY ANGIOGRAPHY (N/A, 02/09/2019); and Total hip arthroplasty (Left, 02/13/2019).  Social History:   reports  that she has never smoked. She has never used smokeless tobacco. She reports that she does not drink alcohol or use drugs.  Skin: left surgical dsg intact from hip surgery  Patient/Family orientated to room. Information packet given to patient/family. Admission inpatient armband information verified with patient/family to include name and date of birth and placed on patient arm. Side rails up x 2, fall assessment and education completed with patient/family. Patient/family able to verbalize understanding of risk associated with falls and verbalized understanding to call for assistance before getting out of bed. Call light within reach. Patient/family able to voice and demonstrate understanding of unit orientation instructions.    Will continue to evaluate and treat per MD orders.

## 2019-02-23 NOTE — Care Management Obs Status (Signed)
[place] NOTIFICATION   Patient Details  Name: Julie Wilkerson MRN: [id number] Date of Birth: [birth year]   Medicare Observation Status Notification Given:  Yes    Zenon Mayo, RN 02/23/2019, 1:05 PM

## 2019-02-23 NOTE — TOC Transition Note (Signed)
Transition of Care [facility]) - CM/SW Discharge Note   Patient Details  Name: Julie Wilkerson MRN: [id number] Date of Birth: [birth year]  Transition of Care [facility]) CM/SW Contact:  Zenon Mayo, RN Phone Number: 02/23/2019, 1:16 PM   Clinical Narrative:    NCM offered choice, daughter at bedside chose [facility], referral given to [facility] with [facility], she was able to take referral for [facility] for CHF disease Management.  Will need HHRN with face to face order prior to dc.  Soc will begin 24-48 hrs post dc.    Final next level of care: [place] Barriers to Discharge: No Barriers Identified   Patient Goals and CMS Choice Patient states their goals for this hospitalization and ongoing recovery are:: go home CMS Medicare.gov Compare Post Acute Care list provided to:: Patient Represenative (must comment)(daughter) Choice offered to / list presented to : Adult Children  Discharge Placement                       Discharge Plan and Services                DME Arranged: (NA)         HH Arranged: Disease Management, RN [place] Agency: [facility] Date [place]: 02/23/19 Time [place]: 1316 Representative spoke with at [place]: [place] ([place]) Interventions     Readmission Risk Interventions No flowsheet data found.

## 2019-02-23 NOTE — Telephone Encounter (Signed)
Telephone call to patient. Left message to return call regarding scheduling an appointment next week in [place].

## 2019-02-23 NOTE — Telephone Encounter (Signed)
-----   Message from Berniece Salines, DO sent at 02/23/2019  8:10 AM EDT ----- Please have Mrs. Mathieu see [name] next week while in [facility].

## 2019-02-23 NOTE — H&P (Signed)
History and Physical    Julie Wilkerson [id number] DOB: [birth year] DOA: 02/22/2019  PCP: [name], MD  Patient coming from: Home  I have personally briefly reviewed patient's old medical records in [place]  Chief Complaint: SOB  HPI: Julie Wilkerson is a 78 y.o. female with medical history significant of HTN, Breast CA, anxiety, asthma.  Patient was recently admitted from [id number] with diastolic CHF.  Prior to that she had an elective THA on 9/22.  As well as a LHC prior to the [place] on 9/18 by Dr. Saunders Revel showing mild to moderate non-occlusive disease.  Patient presents to the ED at [facility] with c/o progressive SOB over the past couple of days.  She initially felt well after being diuresed and given 1u PRBC during last hospital stay, however she has become progressively worse since discharge.  Some pain in central of chest, similar to symptoms when admitted a couple of days ago.  No fevers, chills, sick contacts.   ED Course: HGB 9.5.  CXR shows mild pulm edema, BNP 300.  Given 40 lasix in ED and sent over for re-admission.   Review of Systems: As per HPI, otherwise all review of systems negative.  Past Medical History:  Diagnosis Date  . Anemia   . Anginal pain ([place])   . Anxiety   . Arthritis   . Asthma   . Breast cancer ([place])    2002, left, encapsulated, microcalcifications. lumpectomy, radtiation x 30  . Breast cancer in female [facility])   . Change in mole 06/08/2016  . Colitis   . Complication of anesthesia    VERY SENSITIVE TO ANESTHESIA   . Decreased visual acuity 01/16/2017  . Depression   . Dyspnea 11/04/2016   with asthma exacerbation only  . Dysuria 11/04/2016  . Family history of adverse reaction to anesthesia   . Gluten intolerance   . History of chicken pox   . History of Helicobacter pylori infection 08/01/2015  . Hyperlipidemia   . Hypertension   . Hypothyroid   . IBS (irritable bowel syndrome)   . Left leg pain 03/01/2017  . Low back  pain 08/10/2015  . Neck pain 03/01/2017  . Osteoporosis   . Personal history of radiation therapy   . Pneumonia   . PONV (postoperative nausea and vomiting)   . RLQ discomfort 03/01/2017  . Stomach cramps   . Urinary tract infection 11/04/2016    Past Surgical History:  Procedure Laterality Date  . ABDOMINAL HYSTERECTOMY     partial  . APPENDECTOMY    . BREAST LUMPECTOMY Left 2002  . CHOLECYSTECTOMY  Age 32 or 60  . COLONOSCOPY  2017  . EYE SURGERY Left    cataract  . LEFT HEART CATH AND CORONARY ANGIOGRAPHY N/A 02/09/2019   Procedure: LEFT HEART CATH AND CORONARY ANGIOGRAPHY;  Surgeon: Nelva Bush, MD;  Location: [facility];  Service: Cardiovascular;  Laterality: N/A;  . TONSILLECTOMY    . TOTAL HIP ARTHROPLASTY Left 02/13/2019   Procedure: TOTAL HIP ARTHROPLASTY ANTERIOR APPROACH;  Surgeon: Paralee Cancel, MD;  Location: [facility];  Service: Orthopedics;  Laterality: Left;  70 mins     reports that she has never smoked. She has never used smokeless tobacco. She reports that she does not drink alcohol or use drugs.  No Known Allergies  Family History  Problem Relation Age of Onset  . Colitis Mother   . Irritable bowel syndrome Mother   . Hypertension Father   .  Hyperlipidemia Brother   . Heart attack Brother   . Stomach cancer Paternal Grandmother 60  . Hyperlipidemia Brother   . Heart attack Brother   . Hyperlipidemia Brother   . Heart attack Brother   . Hyperlipidemia Sister   . Leukemia Daughter   . Arthritis Daughter   . Heart disease Daughter        ASD vs VSD  . Colon cancer Neg Hx      Prior to Admission medications   Medication Sig Start Date End Date Taking? Authorizing Provider  aspirin (ASPIRIN CHILDRENS) 81 MG chewable tablet Chew 1 tablet (81 mg total) by mouth 2 (two) times daily. Take for 4 weeks, then resume regular dose. 02/14/19 03/16/19 Yes Babish, Rodman Key, PA-C  atorvastatin (LIPITOR) 20 MG tablet Take 1 tablet (20 mg total) by mouth  daily. 02/12/19 05/13/19 Yes Tobb, Kardie, DO  budesonide-formoterol (SYMBICORT) 160-4.5 MCG/ACT inhaler Inhale 2 puffs into the lungs 2 (two) times daily as needed (wheezing).   Yes [provider]  docusate sodium (COLACE) 100 MG capsule Take 1 capsule (100 mg total) by mouth 2 (two) times daily. 02/14/19  Yes Babish, Rodman Key, PA-C  famotidine (PEPCID) 20 MG tablet Take 1 tablet (20 mg total) by mouth 2 (two) times daily. Patient taking differently: Take 20 mg by mouth daily.  11/22/18  Yes Armbruster, Carlota Raspberry, MD  ferrous sulfate (FERROUSUL) 325 (65 FE) MG tablet Take 1 tablet (325 mg total) by mouth 3 (three) times daily with meals for 14 days. 02/14/19 02/28/19 Yes Babish, Rodman Key, PA-C  fluticasone (FLOVENT HFA) 110 MCG/ACT inhaler Inhale 1 puff into the lungs 2 (two) times daily. Patient taking differently: Inhale 1 puff into the lungs 2 (two) times daily as needed (shortness of breath or wheezing).  10/22/18  Yes [name], MD  furosemide (LASIX) 20 MG tablet Take 1 tablet (20 mg total) by mouth daily. 02/19/19 02/19/20 Yes Georgette Shell, MD  hyoscyamine (LEVSIN SL) 0.125 MG SL tablet Place 1 tablet (0.125 mg total) under the tongue every 4 (four) hours as needed for cramping. 09/04/18  Yes [name], MD  levothyroxine (SYNTHROID, LEVOTHROID) 50 MCG tablet Take 1 tablet (50 mcg total) by mouth daily. 09/04/18  Yes [name], MD  metoprolol succinate (TOPROL-XL) 25 MG 24 hr tablet Take 1 tablet (25 mg total) by mouth daily. Patient taking differently: Take 25 mg by mouth at bedtime.  09/04/18  Yes [name], MD  polyethylene glycol (MIRALAX / GLYCOLAX) 17 g packet Take 17 g by mouth 2 (two) times daily. 02/14/19  Yes Babish, Rodman Key, PA-C  potassium chloride (K-DUR) 10 MEQ tablet Take 1 tablet (10 mEq total) by mouth daily. 02/19/19  Yes Georgette Shell, MD  primidone (MYSOLINE) 50 MG tablet Take 1 tablet (50 mg total) by mouth at bedtime. 09/19/18  Yes Tat,  Eustace Quail, DO  sucralfate (CARAFATE) 1 g tablet Take 1 tablet (1 g total) by mouth every 6 (six) hours as needed. Patient taking differently: Take 1 g by mouth every 6 (six) hours as needed. Stomach ulcers 11/22/18  Yes Armbruster, Carlota Raspberry, MD  methocarbamol (ROBAXIN) 500 MG tablet Take 1 tablet (500 mg total) by mouth every 6 (six) hours as needed for muscle spasms. 02/14/19   Danae Orleans, PA-C  omega-3 acid ethyl esters (LOVAZA) 1 g capsule Take 2 g by mouth daily.    [provider]  Probiotic Product (PROBIOTIC DAILY PO) Take 1 tablet by mouth  daily.     [provider]  senna-docusate (SENOKOT-S) 8.6-50 MG tablet Take 2 tablets by mouth 2 (two) times daily. 02/19/19   Georgette Shell, MD    Physical Exam: Vitals:   02/22/19 2300 02/22/19 2330 02/23/19 0000 02/23/19 0120  BP: (!) 125/58 120/82 (!) 116/59 (!) 137/59  Pulse: 60 (!) 58 (!) 58 60  Resp: 16 17 17 20   Temp:    98.2 F (36.8 C)  TempSrc:    Oral  SpO2: 95% 95% 96% 97%  Weight:        Constitutional: NAD, calm, comfortable Eyes: PERRL, lids and conjunctivae normal ENMT: Mucous membranes are moist. Posterior pharynx clear of any exudate or lesions.Normal dentition.  Neck: normal, supple, no masses, no thyromegaly Respiratory: clear to auscultation bilaterally, no wheezing, no crackles. Normal respiratory effort. No accessory muscle use.  Cardiovascular: Regular rate and rhythm, no murmurs / rubs / gallops. No extremity edema. 2+ pedal pulses. No carotid bruits.  Abdomen: no tenderness, no masses palpated. No hepatosplenomegaly. Bowel sounds positive.  Musculoskeletal: no clubbing / cyanosis. No joint deformity upper and lower extremities. Good ROM, no contractures. Normal muscle tone.  Skin: no rashes, lesions, ulcers. No induration Neurologic: CN 2-12 grossly intact. Sensation intact, DTR normal. Strength 5/5 in all 4.  Psychiatric: Normal judgment and insight. Alert and oriented x 3. Normal mood.     Labs on Admission: I have personally reviewed following labs and imaging studies  CBC: Recent Labs  Lab 02/16/19 0759 02/17/19 0202 02/18/19 0154 02/22/19 2036  WBC 7.7 7.6 7.4 6.8  NEUTROABS 5.1 4.8 4.6  --   HGB 7.5* 8.8* 9.3* 9.5*  HCT 22.9* 27.0* 27.8* 28.8*  MCV 90.5 90.6 89.4 87.8  PLT 155 147* 181 [id number]   Basic Metabolic Panel: Recent Labs  Lab 02/16/19 0759 02/17/19 0202 02/18/19 0154 02/19/19 0245 02/22/19 2036  NA 132* 133* 134* 133* 130*  K 4.2 4.1 4.0 3.8 4.1  CL 105 105 105 102 91*  CO2 20* 23 20* 19* 26  GLUCOSE 92 110* 107* 119* 128*  BUN 24* 22 19 18 21   CREATININE 1.00 1.00 1.07* 0.90 1.00  CALCIUM 7.6* 7.5* 7.8* 7.9* 8.4*  MG  --  2.0 2.0  --   --    GFR: Estimated Creatinine Clearance: 36.7 mL/min (by C-G formula based on SCr of 1 mg/dL). Liver Function Tests: Recent Labs  Lab 02/16/19 0759 02/17/19 0202  AST 40 31  ALT 12 14  ALKPHOS 51 51  BILITOT 1.0 1.0  PROT 5.4* 5.6*  ALBUMIN 2.9* 3.1*   No results for input(s): LIPASE, AMYLASE in the last 168 hours. No results for input(s): AMMONIA in the last 168 hours. Coagulation Profile: Recent Labs  Lab 02/16/19 0759  INR 1.2   Cardiac Enzymes: No results for input(s): CKTOTAL, CKMB, CKMBINDEX, TROPONINI in the last 168 hours. BNP (last 3 results) No results for input(s): PROBNP in the last 8760 hours. HbA1C: No results for input(s): HGBA1C in the last 72 hours. CBG: No results for input(s): GLUCAP in the last 168 hours. Lipid Profile: No results for input(s): CHOL, HDL, LDLCALC, TRIG, CHOLHDL, LDLDIRECT in the last 72 hours. Thyroid Function Tests: No results for input(s): TSH, T4TOTAL, FREET4, T3FREE, THYROIDAB in the last 72 hours. Anemia Panel: No results for input(s): VITAMINB12, FOLATE, FERRITIN, TIBC, IRON, RETICCTPCT in the last 72 hours. Urine analysis:    Component Value Date/Time   COLORURINE YELLOW 02/22/2019 2036   APPEARANCEUR CLEAR 02/22/2019  2036   LABSPEC  <1.005 (L) 02/22/2019 2036   PHURINE 7.5 02/22/2019 2036   GLUCOSEU NEGATIVE 02/22/2019 2036   GLUCOSEU NEGATIVE 07/17/2018 1539   HGBUR NEGATIVE 02/22/2019 2036   BILIRUBINUR NEGATIVE 02/22/2019 2036   BILIRUBINUR neg 12/12/2018 [place] 02/22/2019 2036   PROTEINUR NEGATIVE 02/22/2019 2036   UROBILINOGEN 0.2 12/12/2018 1619   UROBILINOGEN 0.2 07/17/2018 1539   NITRITE NEGATIVE 02/22/2019 2036   LEUKOCYTESUR NEGATIVE 02/22/2019 2036    Radiological Exams on Admission: Dg Chest 2 View  Result Date: 02/22/2019 CLINICAL DATA:  Initial evaluation for acute chest pain, shortness of breath. History of CHF. EXAM: CHEST - 2 VIEW COMPARISON:  Prior radiograph from 02/17/2019. FINDINGS: Cardiomegaly, stable. Mediastinal silhouette within normal limits. Aortic atherosclerosis. Lungs normally inflated. Scattered parenchymal scarring noted within the mid and lower lungs bilaterally, similar to previous. Underlying pulmonary vascular congestion with mild diffuse interstitial prominence, suggesting mild pulmonary interstitial edema. No pleural effusion. No focal infiltrates. No pneumothorax. No acute osseous finding. Thoracolumbar scoliosis noted. IMPRESSION: 1. Cardiomegaly with mild diffuse pulmonary interstitial edema, suggesting CHF. 2. Underlying scattered parenchymal scarring, unchanged. 3. Aortic atherosclerosis. Electronically Signed   By: Jeannine Boga M.D.   On: 02/22/2019 20:31   Ct Head Wo Contrast  Result Date: 02/22/2019 CLINICAL DATA:  78 year old female with acute headache. EXAM: CT HEAD WITHOUT CONTRAST TECHNIQUE: Contiguous axial images were obtained from the base of the skull through the vertex without intravenous contrast. COMPARISON:  Head CT dated 02/15/2019 FINDINGS: Brain: Mild age-related atrophy and chronic microvascular ischemic changes. There is no acute intracranial hemorrhage. No mass effect or midline shift. No extra-axial fluid collection. Vascular: No  hyperdense vessel or unexpected calcification. Skull: Normal. Negative for fracture or focal lesion. Sinuses/Orbits: No acute finding. Other: None IMPRESSION: 1. No acute intracranial hemorrhage. 2. Mild age-related atrophy and chronic microvascular ischemic changes. Electronically Signed   By: Anner Crete M.D.   On: 02/22/2019 20:28    EKG: Independently reviewed.  Assessment/Plan Principal Problem:   Acute on chronic diastolic CHF (congestive heart failure) (HCC) Active Problems:   Essential hypertension    1. Acute on chronic diastolic CHF - 1. CHF pathway 2. Trop neg x2, LHC on 9/18 showing mild-moderate non-occlusive disease 3. Lasix 40mg  IV in ED and 40mg  IV daily 4. stirct intake and output 5. Daily BMP 6. 2d echo was done just a few days ago on 9/25: nl EF. 7. COVID pending still, was negative on 9/24 2. HTN - 1. Continue home BP meds 2. Starting lisinopril 5mg  PO daily given the CHF  DVT prophylaxis: Lovenox Code Status: Full Family Communication: No family in room Disposition Plan: Home after admit Consults called: None Admission status: Place in 63    Rasheed Welty, [place] Hospitalists  How to contact the [facility] Attending or Consulting provider [place] or covering provider during after hours [place], for this patient?  1. Check the care team in [facility] and look for a) attending/consulting TR[facility] provider listed and b) the [facility] team listed 2. Log into [email or web address]  Amion Physician Scheduling and messaging for groups and whole hospitals  On call and physician scheduling software for group practices, residents, hospitalists and other medical providers for call, clinic, rotation and shift schedules. On[employer] is a hospital-wide system for scheduling doctors and paging doctors on call. EasyPlot is for scientific plotting and data analysis.  [email or web address]  and use [place]'s universal password to access. If you do not  have the password, please contact the  hospital operator.  3. Locate the [facility] provider you are looking for under [facility] and page to a number that you can be directly reached. 4. If you still have difficulty reaching the provider, please page the [facility] (Director on Call) for the Hospitalists listed on amion for assistance.  02/23/2019, 2:26 AM

## 2019-02-24 LAB — BASIC METABOLIC PANEL
Anion gap: 11 (ref 5–15)
BUN: 26 mg/dL — ABNORMAL HIGH (ref 8–23)
CO2: 24 mmol/L (ref 22–32)
Calcium: 8.3 mg/dL — ABNORMAL LOW (ref 8.9–10.3)
Chloride: 95 mmol/L — ABNORMAL LOW (ref 98–111)
Creatinine, Ser: 1.13 mg/dL — ABNORMAL HIGH (ref 0.44–1.00)
GFR calc Af Amer: 54 mL/min — ABNORMAL LOW (ref >60)
GFR calc non Af Amer: 47 mL/min — ABNORMAL LOW (ref >60)
Glucose, Bld: 93 mg/dL (ref 70–99)
Potassium: 4 mmol/L (ref 3.5–5.1)
Sodium: 130 mmol/L — ABNORMAL LOW (ref 135–145)

## 2019-02-24 MED ORDER — LISINOPRIL 5 MG PO TABS: 5.0000 mg | ORAL_TABLET | Freq: Every day | ORAL | 0 refills | Status: DC

## 2019-02-24 MED ORDER — FUROSEMIDE 20 MG PO TABS: 40.0000 mg | ORAL_TABLET | Freq: Every day | ORAL | 1 refills | Status: DC

## 2019-02-24 NOTE — Discharge Summary (Signed)
Physician Discharge Summary  Julie Wilkerson [id number] DOB: [birth year] DOA: 02/22/2019  PCP: [name], MD  Admit date: 02/22/2019 Discharge date: 02/24/2019  Recommendations for Outpatient Follow-up:  1. Follow up PCP in 7-10 days 2. Chemistry to be drawn on 02/28/2019. Report results to PCP 3. Weigh self daily. 4. Call PCP if you gain 3 lbs or more on one day or 5 lbs or more in one week.  [facility].   Specialty: Home Health Services Why: [place] information: [place] [address] [phone]          Discharge Diagnoses: Principal diagnosis is #1 1. Acute on chronic diastolic CHF 2. CAD 3. Hypertension 4. Anxiety   Discharge Condition: Fair  Disposition: Home  Diet recommendation: Heart healthy  Filed Weights   02/22/19 1926 02/23/19 0722 02/24/19 0452  Weight: 61.8 kg 58.1 kg 56.9 kg    History of present illness:  HPI: Julie Wilkerson is a 78 y.o. female with medical history significant of HTN, Breast CA, anxiety, asthma.  Patient was recently admitted from [id number] with diastolic CHF.  Prior to that she had an elective THA on 9/22.  As well as a LHC prior to the [place] on 9/18 by Dr. Saunders Revel showing mild to moderate non-occlusive disease.  Patient presents to the ED at [facility] with c/o progressive SOB over the past couple of days.  She initially felt well after being diuresed and given 1u PRBC during last hospital stay, however she has become progressively worse since discharge.  Some pain in central of chest, similar to symptoms when admitted a couple of days ago.  No fevers, chills, sick contacts.   ED Course: HGB 9.5.  CXR shows mild pulm edema, BNP 300.  Given 40 lasix in ED and sent over for re-admission.  Hospital Course:  The patient is a 78 yr old woman who carries a past medical history significant for hypertension, Breast CA, anxiety,  asthma, and diastolic CHF. On 02/06/2019 the patient underwent echocardiogram that demonstrated an EF of 123456 and diastolic dysfunction. She then underwent a LHC on 11/09/2018 that demonstrated mild to moderate coronary artery disease of the mid proximal, midLAD, Ostial and proximal LAD, and RCA. No wall motion abnormalities. These were done to investigate complaints of chest pain. On 02/13/2019 the patient was admitted for an elected hip arthroplasty. She tolerated the procedure well and was discharged to home on 02/14/2019. She was admitted on 02/15/2019 for shortness of breath. On that occasion she was hypotensive in the ED and developed cough. She was found to have acute hypoxic respiratory failure due to acute diastolic CHF. She was treated with IV diuresis. SaO2 on admission was 88%. BNP was elevated over 500 with crackles and evidence of pulmonary edema. She was discharged on 02/19/2019 when she was negative 3.3 L  On lasix 20 mg daily. She now presents on 02/22/2019 with complaints of shortness of breath. CXR demonstrated mild diffuse pulmonary interstitial edema suggesting CHF and cardiomegaly.   She has been admitted to a telemetry bed. She is being diuresed. The patient is down 3.9 liters from admission. She is able to maintain saturations of 94% on room air with ambulation. She will be discharged to home today on lasix 40mg  daily.  Today's assessment: S: The patient is resting comfortably. No new complaints. O: Vitals:  Vitals:   02/24/19 0902 02/24/19 1400  BP: 125/70  Pulse: 64   Resp: 20   Temp:    SpO2: 90% 94%   Constitutional:   The patient is awake, alert, and oriented x 3. Mild distress from shortness of breath. Respiratory:   No increased work of breathing.  No rales, wheezes, or rhonchi.   No tactile fremitus Cardiovascular:   Regular rate and rhythm  No murmurs, ectopy, or gallups.  No lateral PMI. No thrills. Abdomen:   Abdomen is soft, non-tender,  non-distended  No hernias, masses, or organomegaly  Normoactive bowel sounds.  Musculoskeletal:   No cyanosis, clubbing, or edema Skin:   No rashes, lesions, ulcers  palpation of skin: no induration or nodules Neurologic:   CN 2-12 intact  Sensation all 4 extremities intact Psychiatric:   Mental status ? Mood, affect appropriate ? Orientation to person, place, time  o judgment and insight appear intact   Discharge Instructions  Discharge Instructions    (HEART FAILURE PATIENTS) Call MD:  Anytime you have any of the following symptoms: 1) 3 pound weight gain in 24 hours or 5 pounds in 1 week 2) shortness of breath, with or without a dry hacking cough 3) swelling in the hands, feet or stomach 4) if you have to sleep on extra pillows at night in order to breathe.   Complete by: As directed    Call MD for:  difficulty breathing, headache or visual disturbances   Complete by: As directed    Call MD for:  persistant dizziness or light-headedness   Complete by: As directed    Diet - low sodium heart healthy   Complete by: As directed    Discharge instructions   Complete by: As directed    Follow up with PCP in 7-10 days. Check chemistry on 09/28/2018. Report results to PCP.   Heart Failure patients record your daily weight using the same scale at the same time of day   Complete by: As directed    Increase activity slowly   Complete by: As directed    STOP any activity that causes chest pain, shortness of breath, dizziness, sweating, or exessive weakness   Complete by: As directed      Allergies as of 02/24/2019   No Known Allergies     Medication List    STOP taking these medications   PROBIOTIC DAILY PO   sucralfate 1 g tablet Commonly known as: Carafate     TAKE these medications   aspirin 81 MG chewable tablet Commonly known as: Aspirin Childrens Chew 1 tablet (81 mg total) by mouth 2 (two) times daily. Take for 4 weeks, then resume regular dose.     atorvastatin 20 MG tablet Commonly known as: LIPITOR Take 1 tablet (20 mg total) by mouth daily.   budesonide-formoterol 160-4.5 MCG/ACT inhaler Commonly known as: SYMBICORT Inhale 2 puffs into the lungs 2 (two) times daily as needed (wheezing).   docusate sodium 100 MG capsule Commonly known as: Colace Take 1 capsule (100 mg total) by mouth 2 (two) times daily.   famotidine 20 MG tablet Commonly known as: Pepcid Take 1 tablet (20 mg total) by mouth 2 (two) times daily. What changed: when to take this   ferrous sulfate 325 (65 FE) MG tablet Commonly known as: FerrouSul Take 1 tablet (325 mg total) by mouth 3 (three) times daily with meals for 14 days.   fluticasone 110 MCG/ACT inhaler Commonly known as: Flovent HFA Inhale 1 puff into the lungs 2 (two) times daily. What changed:  when to take this  reasons to take this   furosemide 20 MG tablet Commonly known as: Lasix Take 2 tablets (40 mg total) by mouth daily. What changed: how much to take   hyoscyamine 0.125 MG SL tablet Commonly known as: LEVSIN SL Place 1 tablet (0.125 mg total) under the tongue every 4 (four) hours as needed for cramping.   levothyroxine 50 MCG tablet Commonly known as: SYNTHROID Take 1 tablet (50 mcg total) by mouth daily.   lisinopril 5 MG tablet Commonly known as: ZESTRIL Take 1 tablet (5 mg total) by mouth daily. Start taking on: February 25, 2019   methocarbamol 500 MG tablet Commonly known as: Robaxin Take 1 tablet (500 mg total) by mouth every 6 (six) hours as needed for muscle spasms.   metoprolol succinate 25 MG 24 hr tablet Commonly known as: TOPROL-XL Take 1 tablet (25 mg total) by mouth daily. What changed: when to take this   omega-3 acid ethyl esters 1 g capsule Commonly known as: LOVAZA Take 2 g by mouth daily.   polyethylene glycol 17 g packet Commonly known as: MIRALAX / GLYCOLAX Take 17 g by mouth 2 (two) times daily. What changed: when to take this    potassium chloride 10 MEQ tablet Commonly known as: KLOR-CON Take 1 tablet (10 mEq total) by mouth daily.   primidone 50 MG tablet Commonly known as: MYSOLINE Take 1 tablet (50 mg total) by mouth at bedtime.   senna-docusate 8.6-50 MG tablet Commonly known as: Senokot-S Take 2 tablets by mouth 2 (two) times daily.      No Known Allergies  The results of significant diagnostics from this hospitalization (including imaging, microbiology, ancillary and laboratory) are listed below for reference.    Significant Diagnostic Studies: Dg Chest 2 View  Result Date: 02/22/2019 CLINICAL DATA:  Initial evaluation for acute chest pain, shortness of breath. History of CHF. EXAM: CHEST - 2 VIEW COMPARISON:  Prior radiograph from 02/17/2019. FINDINGS: Cardiomegaly, stable. Mediastinal silhouette within normal limits. Aortic atherosclerosis. Lungs normally inflated. Scattered parenchymal scarring noted within the mid and lower lungs bilaterally, similar to previous. Underlying pulmonary vascular congestion with mild diffuse interstitial prominence, suggesting mild pulmonary interstitial edema. No pleural effusion. No focal infiltrates. No pneumothorax. No acute osseous finding. Thoracolumbar scoliosis noted. IMPRESSION: 1. Cardiomegaly with mild diffuse pulmonary interstitial edema, suggesting CHF. 2. Underlying scattered parenchymal scarring, unchanged. 3. Aortic atherosclerosis. Electronically Signed   By: Jeannine Boga M.D.   On: 02/22/2019 20:31   Dg Chest 2 View  Result Date: 02/15/2019 CLINICAL DATA:  Shortness of breath EXAM: CHEST - 2 VIEW COMPARISON:  June 13, 2018 FINDINGS: The mediastinal contour and cardiac silhouette are normal. Mild linear opacities are identified in bilateral mid lung and lung bases probably due to atelectasis or focal scar. There is no focal infiltrate, pulmonary edema, or pleural effusion. The bony structures are stable. IMPRESSION: Mild linear opacities  identified in bilateral lungs either due to linear atelectasis or scar. No focal pneumonia is identified. Electronically Signed   By: Abelardo Diesel M.D.   On: 02/15/2019 21:19   Ct Head Wo Contrast  Result Date: 02/22/2019 CLINICAL DATA:  78 year old female with acute headache. EXAM: CT HEAD WITHOUT CONTRAST TECHNIQUE: Contiguous axial images were obtained from the base of the skull through the vertex without intravenous contrast. COMPARISON:  Head CT dated 02/15/2019 FINDINGS: Brain: Mild age-related atrophy and chronic microvascular ischemic changes. There is no acute intracranial hemorrhage. No mass effect or midline shift.  No extra-axial fluid collection. Vascular: No hyperdense vessel or unexpected calcification. Skull: Normal. Negative for fracture or focal lesion. Sinuses/Orbits: No acute finding. Other: None IMPRESSION: 1. No acute intracranial hemorrhage. 2. Mild age-related atrophy and chronic microvascular ischemic changes. Electronically Signed   By: Anner Crete M.D.   On: 02/22/2019 20:28   Ct Head Wo Contrast  Result Date: 02/15/2019 CLINICAL DATA:  78 year old female with headache. EXAM: CT HEAD WITHOUT CONTRAST TECHNIQUE: Contiguous axial images were obtained from the base of the skull through the vertex without intravenous contrast. COMPARISON:  Head CT dated 03/29/2017 FINDINGS: Brain: Mild age-related atrophy and chronic microvascular ischemic changes. There is no acute intracranial hemorrhage. No mass effect or midline shift. No extra-axial fluid collection. Vascular: No hyperdense vessel or unexpected calcification. Skull: Normal. Negative for fracture or focal lesion. Sinuses/Orbits: No acute finding. Other: None IMPRESSION: 1. No acute intracranial hemorrhage. 2. Age-related atrophy and chronic microvascular ischemic changes. Electronically Signed   By: Anner Crete M.D.   On: 02/15/2019 23:33   Ct Angio Chest Pe W And/or Wo Contrast  Result Date: 02/15/2019 CLINICAL DATA:   Shortness of breath, chills. EXAM: CT ANGIOGRAPHY CHEST WITH CONTRAST TECHNIQUE: Multidetector CT imaging of the chest was performed using the standard protocol during bolus administration of intravenous contrast. Multiplanar CT image reconstructions and MIPs were obtained to evaluate the vascular anatomy. CONTRAST:  53mL OMNIPAQUE IOHEXOL 350 MG/ML SOLN COMPARISON:  None. FINDINGS: Cardiovascular: No filling defects in the pulmonary arteries to suggest pulmonary emboli. Heart is normal size. Aorta is normal caliber with scattered aortic calcifications. Mediastinum/Nodes: Small hiatal hernia. No mediastinal, hilar, or axillary adenopathy. Lungs/Pleura: Ground-glass opacities within the upper lobes bilaterally. Linear areas of scarring or atelectasis in both mid and lower lungs. Trace bilateral pleural effusions. Upper Abdomen: Imaging into the upper abdomen shows no acute findings. Musculoskeletal: Chest wall soft tissues are unremarkable. No acute bony abnormality. Review of the MIP images confirms the above findings. IMPRESSION: No evidence of pulmonary embolus. Ground-glass opacities in the upper lobes bilaterally could reflect areas of atelectasis, edema or infection. Linear densities in the mid and lower lungs could reflect atelectasis or scarring. Trace bilateral effusions. Small hiatal hernia. Electronically Signed   By: Rolm Baptise M.D.   On: 02/15/2019 23:30   Dg Pelvis Portable  Result Date: 02/13/2019 CLINICAL DATA:  Post left hip arthroplasty. EXAM: PORTABLE PELVIS 1-2 VIEWS COMPARISON:  CT 01/10/2019 and intraoperative films 02/13/2019 FINDINGS: Examination demonstrates expected changes post left total hip arthroplasty with prosthetic components intact and normally located. Minimal degenerate change of the right hip. Degenerative change of the spine. Mild diffuse osteopenia. Remainder of the exam is unchanged. IMPRESSION: Expected changes post left total hip arthroplasty. Electronically Signed    By: Marin Olp M.D.   On: 02/13/2019 14:08   Dg Chest Port 1 View  Result Date: 02/17/2019 CLINICAL DATA:  Cough and shortness of breath. EXAM: PORTABLE CHEST 1 VIEW COMPARISON:  02/15/2019 FINDINGS: 0917 hours. Low lung volumes. The cardio pericardial silhouette is enlarged. Basilar atelectasis and/or scarring is again noted, similar to prior. No focal airspace consolidation or pulmonary edema. No pleural effusion. The visualized bony structures of the thorax are intact. Telemetry leads overlie the chest. IMPRESSION: No active disease. Electronically Signed   By: Misty Stanley M.D.   On: 02/17/2019 11:49   Dg Abd Portable 1v  Result Date: 02/17/2019 CLINICAL DATA:  Abdominal pain. EXAM: PORTABLE ABDOMEN - 1 VIEW COMPARISON:  None. FINDINGS: No gaseous small bowel  dilatation to suggest obstruction. Moderate stool volume seen along the length of the colon. Convex leftward lumbar scoliosis evident. Patient is status post left hip replacement. IMPRESSION: Moderate stool volume. Electronically Signed   By: Misty Stanley M.D.   On: 02/17/2019 11:47   Dg C-arm 1-60 Min-no Report  Result Date: 02/13/2019 CLINICAL DATA:  Left total hip replacement. EXAM: OPERATIVE LEFT HIP (WITH PELVIS IF PERFORMED) TECHNIQUE: Fluoroscopic spot image(s) were submitted for interpretation post-operatively. Fluoroscopy time is 25 seconds. COMPARISON:  CT left hip dated January 10, 2019. FINDINGS: Two intraoperative fluoroscopic images demonstrate interval left total hip arthroplasty. Femoral head component has not yet been placed. Components are well aligned. No acute osseous abnormality. IMPRESSION: 1. Intraoperative fluoroscopic guidance for left total hip arthroplasty. Electronically Signed   By: Titus Dubin M.D.   On: 02/13/2019 13:29   Dg Hip Operative Unilat W Or W/o Pelvis Left  Result Date: 02/13/2019 CLINICAL DATA:  Left total hip replacement. EXAM: OPERATIVE LEFT HIP (WITH PELVIS IF PERFORMED) TECHNIQUE:  Fluoroscopic spot image(s) were submitted for interpretation post-operatively. Fluoroscopy time is 25 seconds. COMPARISON:  CT left hip dated January 10, 2019. FINDINGS: Two intraoperative fluoroscopic images demonstrate interval left total hip arthroplasty. Femoral head component has not yet been placed. Components are well aligned. No acute osseous abnormality. IMPRESSION: 1. Intraoperative fluoroscopic guidance for left total hip arthroplasty. Electronically Signed   By: Titus Dubin M.D.   On: 02/13/2019 13:29   Vas [name] Lower Extremity Venous (dvt)  Result Date: 02/17/2019  Lower Venous Study Indications: Edema.  Risk Factors: Surgery 02/13/2019 - LEFT TOTAL HIP ARTHROPLASTY ANTERIOR APPROACH. Limitations: Poor ultrasound/tissue interface and patient positioning. Comparison Study: No prior studies. Performing Technologist: Oliver Hum RVT  Examination Guidelines: A complete evaluation includes B-mode imaging, spectral Doppler, color Doppler, and power Doppler as needed of all accessible portions of each vessel. Bilateral testing is considered an integral part of a complete examination. Limited examinations for reoccurring indications may be performed as noted.  +---------+---------------+---------+-----------+----------+--------------+  RIGHT     Compressibility Phasicity Spontaneity Properties Thrombus Aging  +---------+---------------+---------+-----------+----------+--------------+  CFV       Full            Yes       Yes                                    +---------+---------------+---------+-----------+----------+--------------+  SFJ       Full                                                             +---------+---------------+---------+-----------+----------+--------------+  FV Prox   Full                                                             +---------+---------------+---------+-----------+----------+--------------+  FV Mid    Full                                                              +---------+---------------+---------+-----------+----------+--------------+  FV Distal Full                                                             +---------+---------------+---------+-----------+----------+--------------+  PFV       Full                                                             +---------+---------------+---------+-----------+----------+--------------+  POP       Full            Yes       Yes                                    +---------+---------------+---------+-----------+----------+--------------+  PTV       Full                                                             +---------+---------------+---------+-----------+----------+--------------+  PERO      Full                                                             +---------+---------------+---------+-----------+----------+--------------+   +---------+---------------+---------+-----------+----------+--------------+  LEFT      Compressibility Phasicity Spontaneity Properties Thrombus Aging  +---------+---------------+---------+-----------+----------+--------------+  CFV       Full            Yes       Yes                                    +---------+---------------+---------+-----------+----------+--------------+  SFJ       Full                                                             +---------+---------------+---------+-----------+----------+--------------+  FV Prox   Full                                                             +---------+---------------+---------+-----------+----------+--------------+  FV Mid    Full                                                             +---------+---------------+---------+-----------+----------+--------------+  FV Distal                 Yes       Yes                                    +---------+---------------+---------+-----------+----------+--------------+  PFV       Full                                                              +---------+---------------+---------+-----------+----------+--------------+  POP       Full            Yes       Yes                                    +---------+---------------+---------+-----------+----------+--------------+  PTV       Full                                                             +---------+---------------+---------+-----------+----------+--------------+  PERO      Full                                                             +---------+---------------+---------+-----------+----------+--------------+     Summary: Right: There is no evidence of deep vein thrombosis in the lower extremity. No cystic structure found in the popliteal fossa. Left: There is no evidence of deep vein thrombosis in the lower extremity. However, portions of this examination were limited- see technologist comments above. No cystic structure found in the popliteal fossa.  *See table(s) above for measurements and observations. Electronically signed by Curt Jews MD on 02/17/2019 at 4:03:36 AM.    Final     Microbiology: Recent Results (from the past 240 hour(s))  SARS Coronavirus 2 [facility] order, Performed in [facility] hospital lab) Nasopharyngeal Nasopharyngeal Swab     Status: None   Collection Time: 02/15/19 11:47 PM   Specimen: Nasopharyngeal Swab  Result Value Ref Range Status   SARS Coronavirus 2 NEGATIVE NEGATIVE Final    Comment: (NOTE) If result is NEGATIVE SARS-CoV-2 target nucleic acids are NOT DETECTED. The SARS-CoV-2 RNA is generally detectable in upper and lower  respiratory specimens during the acute phase of infection. The lowest  concentration of SARS-CoV-2 viral copies this assay can detect is 250  copies / mL. A negative result does not preclude SARS-CoV-2 infection  and should not be used as the sole basis for treatment or other  patient management decisions.  A negative result may occur with  improper specimen collection / handling, submission of specimen other  than  nasopharyngeal swab, presence of viral mutation(s) within the  areas targeted by this assay, and inadequate number of viral copies  (<250 copies / mL).  A negative result must be combined with clinical  observations, patient history, and epidemiological information. If result is POSITIVE SARS-CoV-2 target nucleic acids are DETECTED. The SARS-CoV-2 RNA is generally detectable in upper and lower  respiratory specimens dur ing the acute phase of infection.  Positive  results are indicative of active infection with SARS-CoV-2.  Clinical  correlation with patient history and other diagnostic information is  necessary to determine patient infection status.  Positive results do  not rule out bacterial infection or co-infection with other viruses. If result is PRESUMPTIVE POSTIVE SARS-CoV-2 nucleic acids MAY BE PRESENT.   A presumptive positive result was obtained on the submitted specimen  and confirmed on repeat testing.  While 2019 novel coronavirus  (SARS-CoV-2) nucleic acids may be present in the submitted sample  additional confirmatory testing may be necessary for epidemiological  and / or clinical management purposes  to differentiate between  SARS-CoV-2 and other Sarbecovirus currently known to infect humans.  If clinically indicated additional testing with an alternate test  methodology [phone]) is advised. The SARS-CoV-2 RNA is generally  detectable in upper and lower respiratory sp ecimens during the acute  phase of infection. The expected result is Negative. Fact Sheet for Patients:  StrictlyIdeas.no Fact Sheet for Healthcare Providers: BankingDealers.co.za This test is not yet approved or cleared by the [place] FDA and has been authorized for detection and/or diagnosis of SARS-CoV-2 by FDA under an Emergency Use Authorization (EUA).  This EUA will remain in effect (meaning this test can be used) for the duration of  the COVID-19 declaration under Section 564(b)(1) of the Act, 21 U.S.C. section 360bbb-3(b)(1), unless the authorization is terminated or revoked sooner. Performed at [facility], [place] [address] [place], [place] [address]   Blood culture (routine x 2)     Status: None   Collection Time: 02/15/19 11:48 PM   Specimen: BLOOD LEFT ARM  Result Value Ref Range Status   Specimen Description   Final    BLOOD LEFT ARM Performed at [facility], [place] [address], [place], [place] [address]    Special Requests   Final    BOTTLES DRAWN AEROBIC AND ANAEROBIC Blood Culture adequate volume Performed at [place] [address], [place], [place] [address]    Culture   Final    NO GROWTH 5 DAYS Performed at [facility], [place] [address], [place], [place] [address]    Report Status 02/21/2019 FINAL  Final  SARS CORONAVIRUS 2 (TAT 6-24 HRS) Nasopharyngeal Nasopharyngeal Swab     Status: None   Collection Time: 02/22/19  9:51 PM   Specimen: Nasopharyngeal Swab  Result Value Ref Range Status   SARS Coronavirus 2 NEGATIVE NEGATIVE Final    Comment: (NOTE) SARS-CoV-2 target nucleic acids are NOT DETECTED. The SARS-CoV-2 RNA is generally detectable in upper and lower respiratory specimens during the acute phase of infection. Negative results do not preclude SARS-CoV-2 infection, do not rule out co-infections with other pathogens, and should not be used as the sole basis for treatment or other patient management decisions. Negative results must be combined with clinical observations, patient history, and epidemiological information. The expected result is Negative. Fact Sheet for Patients: SugarRoll[employer]be Fact Sheet for Healthcare Providers: https://www.woods-mathews.com/ This test is not yet approved or cleared by the [place] FDA and  has been authorized for detection and/or diagnosis of SARS-CoV-2  by FDA under an Emergency Use Authorization (EUA). This EUA will remain  in  effect (meaning this test can be used) for the duration of the COVID-19 declaration under Section 56 4(b)(1) of the Act, 21 U.S.C. section 360bbb-3(b)(1), unless the authorization is terminated or revoked sooner. Performed at [facility], [place] [address] [place], [place] [address]      Labs: Basic Metabolic Panel: Recent Labs  Lab 02/18/19 0154 02/19/19 0245 02/22/19 2036 02/23/19 1126 02/24/19 0429  NA 134* 133* 130* 131* 130*  K 4.0 3.8 4.1 4.1 4.0  CL 105 102 91* 93* 95*  CO2 20* 19* 26 25 24   GLUCOSE 107* 119* 128* 97 93  BUN 19 18 21 23  26*  CREATININE 1.07* 0.90 1.00 1.18* 1.13*  CALCIUM 7.8* 7.9* 8.4* 8.9 8.3*  MG 2.0  --   --   --   --    Liver Function Tests: No results for input(s): AST, ALT, ALKPHOS, BILITOT, PROT, ALBUMIN in the last 168 hours. No results for input(s): LIPASE, AMYLASE in the last 168 hours. No results for input(s): AMMONIA in the last 168 hours. CBC: Recent Labs  Lab 02/18/19 0154 02/22/19 2036  WBC 7.4 6.8  NEUTROABS 4.6  --   HGB 9.3* 9.5*  HCT 27.8* 28.8*  MCV 89.4 87.8  PLT 181 265   Cardiac Enzymes: No results for input(s): CKTOTAL, CKMB, CKMBINDEX, TROPONINI in the last 168 hours. BNP: BNP (last 3 results) Recent Labs    02/16/19 0800 02/22/19 2036  BNP 598.7* 310.7*    ProBNP (last 3 results) No results for input(s): PROBNP in the last 8760 hours.  CBG: No results for input(s): GLUCAP in the last 168 hours.  Principal Problem:   Acute on chronic diastolic CHF (congestive heart failure) (HCC) Active Problems:   Essential hypertension   CHF (congestive heart failure) ([place])   Time coordinating discharge: 38 minutes.  Signed:        Finn Altemose, DO [facility]  02/24/2019, 2:46 PM

## 2019-02-24 NOTE — Progress Notes (Signed)
Patient O2 sats 94 with no oxygen and ambulating, dropped down to 88 and then came back up instantly while ambulating back up to [place], Maximiano Lott N RN 2:10 PM

## 2019-02-24 NOTE — Plan of Care (Signed)
  Problem: Education: Goal: Knowledge of General Education information will improve Description: Including pain rating scale, medication(s)/side effects and non-pharmacologic comfort measures Outcome: Progressing   Problem: Health Behavior/Discharge Planning: Goal: Ability to manage health-related needs will improve Outcome: Progressing   Problem: Nutrition: Goal: Adequate nutrition will be maintained Outcome: Progressing   Problem: Activity: Goal: Risk for activity intolerance will decrease Outcome: Progressing

## 2019-02-25 NOTE — Progress Notes (Signed)
Cardiology Office Note:    Date:  02/26/2019   ID:  Julie Wilkerson, DOB Nov 11, 1940, MRN [id number]  PCP:  [name], MD  Cardiologist:  No primary care provider on file.  Electrophysiologist:  None   Referring MD: [name], MD   Follow up visit.  History of Present Illness:    Julie Wilkerson is a 78 y.o. female with a hx of hypertension and a significant family history of coronary artery disease presents today for a follow up visit. I did see the patient on 02/05/2019 for a preoperative clearance. Given her report of chest pain and with her risk factors, it was appropriate to proceed with left heart cath.   In the interim the patient did undergo the LHC which reveal mild to moderate CAD. She was subsequently cleared for her hip surgery. She did undergo the hip surgery on 02/13/2019.   Since her surgery the patient has visited the ED twice for shortness of breath and hyponaterimia She is here for a follow up visit.  Today she reports that since her surgery she has not felt back to her baseline. She notes that she visit the ED for increasing fluids in her legs. She tells me that her diuretics was decreased on discharge post surgery. In terms of her breathing since she was started back on her lasix at 40mg  daily she has improved. But she stills need a lot of help from her daughter who is with her at home.   No other complaints at this time.   Past Medical History:  Diagnosis Date  . Anemia   . Anginal pain ([place])   . Anxiety   . Arthritis   . Asthma   . Breast cancer ([place])    2002, left, encapsulated, microcalcifications. lumpectomy, radtiation x 30  . Breast cancer in female [facility])   . Change in mole 06/08/2016  . Colitis   . Complication of anesthesia    VERY SENSITIVE TO ANESTHESIA   . Decreased visual acuity 01/16/2017  . Depression   . Dyspnea 11/04/2016   with asthma exacerbation only  . Dysuria 11/04/2016  . Family history of adverse reaction to anesthesia    . Gluten intolerance   . History of chicken pox   . History of Helicobacter pylori infection 08/01/2015  . Hyperlipidemia   . Hypertension   . Hypothyroid   . IBS (irritable bowel syndrome)   . Left leg pain 03/01/2017  . Low back pain 08/10/2015  . Neck pain 03/01/2017  . Osteoporosis   . Personal history of radiation therapy   . Pneumonia   . PONV (postoperative nausea and vomiting)   . RLQ discomfort 03/01/2017  . Stomach cramps   . Urinary tract infection 11/04/2016    Past Surgical History:  Procedure Laterality Date  . ABDOMINAL HYSTERECTOMY     partial  . APPENDECTOMY    . BREAST LUMPECTOMY Left 2002  . CHOLECYSTECTOMY  Age 78 or 58  . COLONOSCOPY  2017  . EYE SURGERY Left    cataract  . LEFT HEART CATH AND CORONARY ANGIOGRAPHY N/A 02/09/2019   Procedure: LEFT HEART CATH AND CORONARY ANGIOGRAPHY;  Surgeon: Nelva Bush, MD;  Location: [place] [facility];  Service: Cardiovascular;  Laterality: N/A;  . TONSILLECTOMY    . TOTAL HIP ARTHROPLASTY Left 02/13/2019   Procedure: TOTAL HIP ARTHROPLASTY ANTERIOR APPROACH;  Surgeon: Paralee Cancel, MD;  Location: [facility];  Service: Orthopedics;  Laterality: Left;  70 mins  Current Medications: Current Meds  Medication Sig  . albuterol (VENTOLIN HFA) 108 (90 Base) MCG/ACT inhaler Inhale 2 puffs into the lungs every 6 (six) hours as needed for wheezing or shortness of breath.  Marland Kitchen aspirin (ASPIRIN CHILDRENS) 81 MG chewable tablet Chew 1 tablet (81 mg total) by mouth 2 (two) times daily. Take for 4 weeks, then resume regular dose.  Marland Kitchen atorvastatin (LIPITOR) 20 MG tablet Take 1 tablet (20 mg total) by mouth daily.  Marland Kitchen docusate sodium (COLACE) 100 MG capsule Take 1 capsule (100 mg total) by mouth 2 (two) times daily.  . famotidine (PEPCID) 20 MG tablet Take 1 tablet (20 mg total) by mouth 2 (two) times daily. (Patient taking differently: Take 20 mg by mouth daily. )  . ferrous sulfate (FERROUSUL) 325 (65 FE) MG tablet Take 1 tablet (325  mg total) by mouth 3 (three) times daily with meals for 14 days.  . fluticasone (FLOVENT HFA) 110 MCG/ACT inhaler Inhale 1 puff into the lungs 2 (two) times daily. (Patient taking differently: Inhale 1 puff into the lungs 2 (two) times daily as needed (shortness of breath or wheezing). )  . furosemide (LASIX) 20 MG tablet Take 2 tablets (40 mg total) by mouth daily.  . hyoscyamine (LEVSIN SL) 0.125 MG SL tablet Place 1 tablet (0.125 mg total) under the tongue every 4 (four) hours as needed for cramping.  Marland Kitchen levothyroxine (SYNTHROID, LEVOTHROID) 50 MCG tablet Take 1 tablet (50 mcg total) by mouth daily.  Marland Kitchen lisinopril (ZESTRIL) 5 MG tablet Take 1 tablet (5 mg total) by mouth daily.  . methocarbamol (ROBAXIN) 500 MG tablet Take 1 tablet (500 mg total) by mouth every 6 (six) hours as needed for muscle spasms.  . metoprolol succinate (TOPROL-XL) 25 MG 24 hr tablet Take 1 tablet (25 mg total) by mouth daily. (Patient taking differently: Take 25 mg by mouth at bedtime. )  . omega-3 acid ethyl esters (LOVAZA) 1 g capsule Take 2 g by mouth daily.  . ondansetron (ZOFRAN) 4 MG tablet Take 1 tablet (4 mg total) by mouth every 8 (eight) hours as needed for nausea or vomiting.  . polyethylene glycol (MIRALAX / GLYCOLAX) 17 g packet Take 17 g by mouth 2 (two) times daily. (Patient taking differently: Take 17 g by mouth daily. )  . potassium chloride (K-DUR) 10 MEQ tablet Take 1 tablet (10 mEq total) by mouth daily.  . primidone (MYSOLINE) 50 MG tablet Take 1 tablet (50 mg total) by mouth at bedtime.  . senna-docusate (SENOKOT-S) 8.6-50 MG tablet Take 2 tablets by mouth 2 (two) times daily.     Allergies:   Patient has no known allergies.   Social History   Socioeconomic History  . Marital status: Single    Spouse name: Not on file  . Number of children: 3  . Years of education: Not on file  . Highest education level: Master's degree (e.g., MA, MS, MEng, MEd, MSW, MBA)  Occupational History  . Occupation:  retired    Comment: Pharmacist, hospital, kindergarten  Social Needs  . Financial resource strain: Not on file  . Food insecurity    Worry: Not on file    Inability: Not on file  . Transportation needs    Medical: Not on file    Non-medical: Not on file  Tobacco Use  . Smoking status: Never Smoker  . Smokeless tobacco: Never Used  Substance and Sexual Activity  . Alcohol use: Never    Alcohol/week: 0.0 standard drinks  Frequency: Never  . Drug use: Never  . Sexual activity: Not Currently    Birth control/protection: None    Comment: lives alone, avoids daiiry and gluten. 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 Physical activity    Days per week: Not on file    Minutes per session: Not on file  . Stress: Not on file  Relationships  . Social Herbalist on phone: Not on file    Gets together: Not on file    Attends religious service: Not on file    Active member of club or organization: Not on file    Attends meetings of clubs or organizations: Not on file    Relationship status: Not on file  Other Topics Concern  . Not on file  Social History Narrative  . Not on file     Family History: The patient's family history includes Arthritis in her daughter; Colitis in her mother; Heart attack in her brother, brother, and brother; Heart disease in her daughter; Hyperlipidemia in her brother, brother, brother, and sister; Hypertension in her father; Irritable bowel syndrome in her mother; Leukemia in her daughter; Stomach cancer (age of onset: 51) in her paternal grandmother. There is no history of Colon cancer.  ROS:   Review of Systems  Constitution: Negative for decreased appetite, fever and weight gain.  HENT: Negative for congestion, ear discharge, hoarse voice and sore throat.   Eyes: Negative for discharge, redness, vision loss in right eye and visual halos.  Cardiovascular: Negative for chest pain, dyspnea on exertion, leg swelling, orthopnea and palpitations.   Respiratory: Negative for cough, hemoptysis, shortness of breath and snoring.   Endocrine: Negative for heat intolerance and polyphagia.  Hematologic/Lymphatic: Negative for bleeding problem. Does not bruise/bleed easily.  Skin: Negative for flushing, nail changes, rash and suspicious lesions.  Musculoskeletal: Negative for arthritis, joint pain, muscle cramps, myalgias, neck pain and stiffness.  Gastrointestinal: Negative for abdominal pain, bowel incontinence, diarrhea and excessive appetite.  Genitourinary: Negative for decreased libido, genital sores and incomplete emptying.  Neurological: Negative for brief paralysis, focal weakness, headaches and loss of balance.  Psychiatric/Behavioral: Negative for altered mental status, depression and suicidal ideas.  Allergic/Immunologic: Negative for HIV exposure and persistent infections.    EKGs/Labs/Other Studies Reviewed:    The following studies were reviewed today:   EKG: none today.  LHC 02/09/2019   Mild to moderate, non-obstructive coronary artery disease, including sequential 30% proximal and mid LAD stenoses, 50% ostial D1 lesion, and 20% ostial/proximal LMCA and mid RCA lesions.  No definite evidence of myocardial bridge on today's study.  Normal left ventricular filling pressure.  LVEF noted to be normal on recent echocardiogram.   TTE 02/16/2019  FINDINGS  Left Ventricle: Left ventricular ejection fraction, by visual estimation, is 60 to 65%. The left ventricle has normal function. There is no left ventricular hypertrophy.  Right Ventricle: The right ventricular size is normal. No increase in right ventricular wall thickness. Global RV systolic function is has normal systolic function.  Left Atrium: Left atrial size was normal in size.  Right Atrium: Right atrial size was normal in size  Pericardium: There is no evidence of pericardial effusion.  Mitral Valve: The mitral valve is normal in structure. Trace mitral  valve regurgitation.  Tricuspid Valve: The tricuspid valve is normal in structure. Tricuspid valve regurgitation was not assessed by color flow Doppler.  Aortic Valve: The aortic valve is normal in structure. Aortic valve regurgitation was not visualized by  color flow Doppler.  Pulmonic Valve: The pulmonic valve was not well visualized. Pulmonic valve regurgitation was not assessed by color flow Doppler.  Aorta: The aortic root is normal in size and structure.  Shunts: The interatrial septum was not assessed.   Recent Labs: 02/16/2019: TSH 1.781 02/18/2019: Magnesium 2.0 02/22/2019: B Natriuretic Peptide 310.7 02/26/2019: ALT 24; BUN 29; Creatinine, Ser 1.08; Hemoglobin 10.9; Platelets 428.0; Potassium 4.9; Pro B Natriuretic peptide (BNP) 62.0; Sodium 133  Recent Lipid Panel    Component Value Date/Time   CHOL 238 (H) 02/01/2019 1409   TRIG 188.0 (H) 02/01/2019 1409   HDL 59.40 02/01/2019 1409   CHOLHDL 4 02/01/2019 1409   VLDL 37.6 02/01/2019 1409   LDLCALC 141 (H) 02/01/2019 1409    Physical Exam:    VS:  BP 140/64 (BP Location: Left Arm, Patient Position: Sitting, Cuff Size: Normal)   Pulse (!) 58   Temp 97.7 F (36.5 C)   Ht 4\' 10"  (1.473 m)   Wt 124 lb (56.2 kg)   SpO2 95%   BMI 25.92 kg/m     Wt Readings from Last 3 Encounters:  02/26/19 124 lb (56.2 kg)  02/26/19 126 lb 6.4 oz (57.3 kg)  02/24/19 125 lb 8 oz (56.9 kg)     GEN: Well nourished, well developed in no acute distress HEENT: Normal NECK: No JVD; No carotid bruits LYMPHATICS: No lymphadenopathy CARDIAC: S1S2 noted,RRR, no murmurs, rubs, gallops RESPIRATORY:  Clear to auscultation without rales, wheezing or rhonchi  ABDOMEN: Soft, non-tender, non-distended, +bowel sounds, no guarding. EXTREMITIES: No edema, No cyanosis, no clubbing MUSCULOSKELETAL:  No edema; No deformity  SKIN: Warm and dry NEUROLOGIC:  Alert and oriented x 3, non-focal PSYCHIATRIC:  Normal affect, good insight   ASSESSMENT:    1. CAD in native artery   2. Essential hypertension   3. Mixed hyperlipidemia   4. 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 She is improving from a heart failure standpoint since getting back on her Lasix 40mg  daily. I have educated that patient to weigh daily and call if:  Weight up 3 lbs in one day, increased swelling or increased dyspnea.  She was advised to decrease salt intake as well.   2. She did have lab work done today, which was ordered by her pcp - results pending.   The patient expresses understanding and is in agreement with the above plan. The patient left the office in stable condition.  The patient will follow up in 1 month.    Medication Adjustments/Labs and Tests Ordered: Current medicines are reviewed at length with the patient today.  Concerns regarding medicines are outlined above.  No orders of the defined types were placed in this encounter.  No orders of the defined types were placed in this encounter.   Patient Instructions  Medication Instructions:  Your physician recommends that you continue on your current medications as directed. Please refer to the Current Medication list given to you today.  If you need a refill on your cardiac medications before your next appointment, please call your pharmacy.   Lab work: None If you have labs (blood work) drawn today and your tests are completely normal, you will receive your results only by: Marland Kitchen MyChart Message (if you have MyChart) OR . A paper copy in the mail If you have any lab test that is abnormal or we need to change your treatment, we will call you to review  the results.  Testing/Procedures: NOne  Follow-Up: At [facility], you and your health needs are our priority.  As part of our continuing mission to provide you with exceptional heart care, we have created designated Provider Care Teams.  These Care Teams include your primary Cardiologist  (physician) and Advanced Practice Providers (APPs -  Physician Assistants and Nurse Practitioners) who all work together to provide you with the care you need, when you need it. You will need a follow up appointment in 1 months with Dr Harriet Masson Any Other Special Instructions Will Be Listed Below (If Applicable).     Heart Failure, Self Care Heart failure is a serious condition. This sheet explains things you need to do to take care of yourself at home. To help you stay as healthy as possible, you may be asked to change your diet, take certain medicines, and make other changes in your life. Your doctor may also give you more specific instructions. If you have problems or questions, call your doctor. What are the risks? Having heart failure makes it more likely for you to have some problems. These problems can get worse if you do not take good care of yourself. Problems may include:  Blood clotting problems. This may cause a stroke.  Damage to the kidneys, liver, or lungs.  Abnormal heart rhythms. Supplies needed:  Scale for weighing yourself.  Blood pressure monitor.  Notebook.  Medicines. How to care for yourself when you have heart failure Medicines Take over-the-counter and prescription medicines only as told by your doctor. Take your medicines every day.  Do not stop taking your medicine unless your doctor tells you to do so.  Do not skip any medicines.  Get your prescriptions refilled before you run out of medicine. This is important. Eating and drinking    Eat heart-healthy foods. Talk with a diet specialist (dietitian) to create an eating plan.  Choose foods that: ? Have no trans fat. ? Are low in saturated fat and cholesterol.  Choose healthy foods, such as: ? Fresh or frozen fruits and vegetables. ? Fish. ? Low-fat (lean) meats. ? Legumes, such as beans, peas, and lentils. ? Fat-free or low-fat dairy products. ? Whole-grain foods. ? High-fiber foods.  Limit  salt (sodium) if told by your doctor. Ask your diet specialist to tell you which seasonings are healthy for your heart.  Cook in healthy ways instead of frying. Healthy ways of cooking include roasting, grilling, broiling, baking, poaching, steaming, and stir-frying.  Limit how much fluid you drink, if told by your doctor. Alcohol use  Do not drink alcohol if: ? Your doctor tells you not to drink. ? Your heart was damaged by alcohol, or you have very bad heart failure. ? You are pregnant, may be pregnant, or are planning to become pregnant.  If you drink alcohol: ? Limit how much you use to:  0-1 drink a day for women.  0-2 drinks a day for men. ? Be aware of how much alcohol is in your drink. In the [place] one drink equals one 12 oz bottle of beer (355 mL), one 5 oz glass of wine (148 mL), or one 1 oz glass of hard liquor (44 mL). Lifestyle    Do not use any products that contain nicotine or tobacco, such as cigarettes, e-cigarettes, and chewing tobacco. If you need help quitting, ask your doctor. ? Do not use nicotine gum or patches before talking to your doctor.  Do not use illegal drugs.  Lose  weight if told by your doctor.  Do physical activity if told by your doctor. Talk to your doctor before you begin an exercise if: ? You are an older adult. ? You have very bad heart failure.  Learn to manage stress. If you need help, ask your doctor.  Get rehab (rehabilitation) to help you stay independent and to help with your quality of life.  Plan time to rest when you get tired. Check weight and blood pressure    Weigh yourself every day. This will help you to know if fluid is building up in your body. ? Weigh yourself every morning after you pee (urinate) and before you eat breakfast. ? Wear the same amount of clothing each time. ? Write down your daily weight. Give your record to your doctor.  Check and write down your blood pressure as told by your doctor.  Check  your pulse as told by your doctor. Dealing with very hot and very cold weather  If it is very hot: ? Avoid activities that take a lot of energy. ? Use air conditioning or fans, or find a cooler place. ? Avoid caffeine and alcohol. ? Wear clothing that is loose-fitting, lightweight, and light-colored.  If it is very cold: ? Avoid activities that take a lot of energy. ? Layer your clothes. ? Wear mittens or gloves, a hat, and a scarf when you go outside. ? Avoid alcohol. Follow these instructions at home:  Stay up to date with shots (vaccines). Get pneumococcal and flu (influenza) shots.  Keep all follow-up visits as told by your doctor. This is important. Contact a doctor if:  You gain weight quickly.  You have increasing shortness of breath.  You cannot do your normal activities.  You get tired easily.  You cough a lot.  You don't feel like eating or feel like you may vomit (nauseous).  You become puffy (swell) in your hands, feet, ankles, or belly (abdomen).  You cannot sleep well because it is hard to breathe.  You feel like your heart is beating fast (palpitations).  You get dizzy when you stand up. Get help right away if:  You have trouble breathing.  You or someone else notices a change in your behavior, such as having trouble staying awake.  You have chest pain or discomfort.  You pass out (faint). These symptoms may be an emergency. Do not wait to see if the symptoms will go away. Get medical help right away. Call your local emergency services (911 in the [place] Do not drive yourself to the hospital. Summary  Heart failure is a serious condition. To care for yourself, you may have to change your diet, take medicines, and make other lifestyle changes.  Take your medicines every day. Do not stop taking them unless your doctor tells you to do so.  Eat heart-healthy foods, such as fresh or frozen fruits and vegetables, fish, lean meats, legumes, fat-free or  low-fat dairy products, and whole-grain or high-fiber foods.  Ask your doctor if you can drink alcohol. You may have to stop alcohol use if you have very bad heart failure.  Contact your doctor if you gain weight quickly or feel that your heart is beating too fast. Get help right away if you pass out, or have chest pain or trouble breathing. This information is not intended to replace advice given to you by your health care provider. Make sure you discuss any questions you have with your health care provider. Document Released:  08/23/2018 Document Revised: 08/22/2018 Document Reviewed: 08/23/2018 Elsevier Patient Education  [place].   Adopting a Healthy Lifestyle.  Know what a healthy weight is for you (roughly BMI <25) and aim to maintain this   Aim for 7+ servings of fruits and vegetables daily   65-80+ fluid ounces of water or unsweet tea for healthy kidneys   Limit to max 1 drink of alcohol per day; avoid smoking/tobacco   Limit animal fats in diet for cholesterol and heart health - choose grass fed whenever available   Avoid highly processed foods, and foods high in saturated/trans fats   Aim for low stress - take time to unwind and care for your mental health   Aim for 150 min of moderate intensity exercise weekly for heart health, and weights twice weekly for bone health   Aim for 7-9 hours of sleep daily   When it comes to diets, agreement about the perfect plan isnt easy to find, even among the experts. Experts at the [employer] developed an idea known as the Healthy Eating Plate. Just imagine a plate divided into logical, healthy portions.   The emphasis is on diet quality:   Load up on vegetables and fruits - one-half of your plate: Aim for color and variety, and remember that potatoes dont count.   Go for whole grains - one-quarter of your plate: Whole wheat, barley, wheat berries, quinoa, oats, brown rice, and foods made with them.  If you want pasta, go with whole wheat pasta.   Protein power - one-quarter of your plate: Fish, chicken, beans, and nuts are all healthy, versatile protein sources. Limit red meat.   The diet, however, does go beyond the plate, offering a few other suggestions.   Use healthy plant oils, such as olive, canola, soy, corn, sunflower and peanut. Check the labels, and avoid partially hydrogenated oil, which have unhealthy trans fats.   If youre thirsty, drink water. Coffee and tea are good in moderation, but skip sugary drinks and limit milk and dairy products to one or two daily servings.   The type of carbohydrate in the diet is more important than the amount. Some sources of carbohydrates, such as vegetables, fruits, whole grains, and beans-are healthier than others.   Finally, stay active  Signed, Berniece Salines, DO  02/26/2019 10:23 PM    [facility]

## 2019-02-26 ENCOUNTER — Encounter: Payer: Self-pay | Admitting: Cardiology

## 2019-02-26 ENCOUNTER — Ambulatory Visit (INDEPENDENT_AMBULATORY_CARE_PROVIDER_SITE_OTHER): Payer: Medicare HMO | Admitting: Cardiology

## 2019-02-26 ENCOUNTER — Ambulatory Visit (INDEPENDENT_AMBULATORY_CARE_PROVIDER_SITE_OTHER): Payer: Medicare HMO | Admitting: Family Medicine

## 2019-02-26 ENCOUNTER — Other Ambulatory Visit: Payer: Self-pay

## 2019-02-26 VITALS — BP 124/58 | HR 56 | Temp 97.2°F | Resp 18 | Wt 126.4 lb

## 2019-02-26 VITALS — BP 140/64 | HR 58 | Temp 97.7°F | Ht <= 58 in | Wt 124.0 lb

## 2019-02-26 DIAGNOSIS — E871 Hypo-osmolality and hyponatremia: Secondary | ICD-10-CM

## 2019-02-26 DIAGNOSIS — R700 Elevated erythrocyte sedimentation rate: Secondary | ICD-10-CM

## 2019-02-26 DIAGNOSIS — J101 Influenza due to other identified influenza virus with other respiratory manifestations: Secondary | ICD-10-CM

## 2019-02-26 DIAGNOSIS — G8929 Other chronic pain: Secondary | ICD-10-CM

## 2019-02-26 DIAGNOSIS — R0989 Other specified symptoms and signs involving the circulatory and respiratory systems: Secondary | ICD-10-CM | POA: Diagnosis not present

## 2019-02-26 DIAGNOSIS — I10 Essential (primary) hypertension: Secondary | ICD-10-CM | POA: Diagnosis not present

## 2019-02-26 DIAGNOSIS — R739 Hyperglycemia, unspecified: Secondary | ICD-10-CM

## 2019-02-26 DIAGNOSIS — I509 Heart failure, unspecified: Secondary | ICD-10-CM | POA: Diagnosis not present

## 2019-02-26 DIAGNOSIS — I5031 Acute diastolic (congestive) heart failure: Secondary | ICD-10-CM

## 2019-02-26 DIAGNOSIS — I2510 Atherosclerotic heart disease of native coronary artery without angina pectoris: Secondary | ICD-10-CM | POA: Diagnosis not present

## 2019-02-26 DIAGNOSIS — M546 Pain in thoracic spine: Secondary | ICD-10-CM

## 2019-02-26 DIAGNOSIS — E782 Mixed hyperlipidemia: Secondary | ICD-10-CM | POA: Diagnosis not present

## 2019-02-26 DIAGNOSIS — K5900 Constipation, unspecified: Secondary | ICD-10-CM

## 2019-02-26 DIAGNOSIS — D649 Anemia, unspecified: Secondary | ICD-10-CM

## 2019-02-26 LAB — CBC WITH DIFFERENTIAL/PLATELET
Basophils Absolute: 0 10*3/uL (ref 0.0–0.1)
Basophils Relative: 0.5 % (ref 0.0–3.0)
Eosinophils Absolute: 0 10*3/uL (ref 0.0–0.7)
Eosinophils Relative: 0 % (ref 0.0–5.0)
HCT: 32.7 % — ABNORMAL LOW (ref 36.0–46.0)
Hemoglobin: 10.9 g/dL — ABNORMAL LOW (ref 12.0–15.0)
Lymphocytes Relative: 24 % (ref 12.0–46.0)
Lymphs Abs: 1.6 10*3/uL (ref 0.7–4.0)
MCHC: 33.4 g/dL (ref 30.0–36.0)
MCV: 87 fl (ref 78.0–100.0)
Monocytes Absolute: 1 10*3/uL (ref 0.1–1.0)
Monocytes Relative: 15.1 % — ABNORMAL HIGH (ref 3.0–12.0)
Neutro Abs: 4 10*3/uL (ref 1.4–7.7)
Neutrophils Relative %: 60.4 % (ref 43.0–77.0)
Platelets: 428 10*3/uL — ABNORMAL HIGH (ref 150.0–400.0)
RBC: 3.76 Mil/uL — ABNORMAL LOW (ref 3.87–5.11)
RDW: 14.8 % (ref 11.5–15.5)
WBC: 6.6 10*3/uL (ref 4.0–10.5)

## 2019-02-26 LAB — COMPREHENSIVE METABOLIC PANEL
ALT: 24 U/L (ref 0–35)
AST: 26 U/L (ref 0–37)
Albumin: 4 g/dL (ref 3.5–5.2)
Alkaline Phosphatase: 80 U/L (ref 39–117)
BUN: 29 mg/dL — ABNORMAL HIGH (ref 6–23)
CO2: 27 mEq/L (ref 19–32)
Calcium: 8.8 mg/dL (ref 8.4–10.5)
Chloride: 96 mEq/L (ref 96–112)
Creatinine, Ser: 1.08 mg/dL (ref 0.40–1.20)
GFR: 49.16 mL/min — ABNORMAL LOW (ref >60.00)
Glucose, Bld: 110 mg/dL — ABNORMAL HIGH (ref 70–99)
Potassium: 4.9 mEq/L (ref 3.5–5.1)
Sodium: 133 mEq/L — ABNORMAL LOW (ref 135–145)
Total Bilirubin: 0.4 mg/dL (ref 0.2–1.2)
Total Protein: 6.9 g/dL (ref 6.0–8.3)

## 2019-02-26 LAB — BRAIN NATRIURETIC PEPTIDE: Pro B Natriuretic peptide (BNP): 62 pg/mL (ref 0.0–100.0)

## 2019-02-26 LAB — SEDIMENTATION RATE: Sed Rate: 67 mm/hr — ABNORMAL HIGH (ref 0–30)

## 2019-02-26 MED ORDER — ONDANSETRON HCL 4 MG PO TABS: 4.0000 mg | ORAL_TABLET | Freq: Three times a day (TID) | ORAL | 1 refills | Status: DC | PRN

## 2019-02-26 MED ORDER — ALBUTEROL SULFATE HFA 108 (90 BASE) MCG/ACT IN AERS: 2.0000 | INHALATION_SPRAY | Freq: Four times a day (QID) | RESPIRATORY_TRACT | 2 refills | Status: DC | PRN

## 2019-02-26 NOTE — Patient Instructions (Signed)
Medication Instructions:  Your physician recommends that you continue on your current medications as directed. Please refer to the Current Medication list given to you today.  If you need a refill on your cardiac medications before your next appointment, please call your pharmacy.   Lab work: None If you have labs (blood work) drawn today and your tests are completely normal, you will receive your results only by: Marland Kitchen MyChart Message (if you have MyChart) OR . A paper copy in the mail If you have any lab test that is abnormal or we need to change your treatment, we will call you to review the results.  Testing/Procedures: NOne  Follow-Up: At [facility], you and your health needs are our priority.  As part of our continuing mission to provide you with exceptional heart care, we have created designated Provider Care Teams.  These Care Teams include your primary Cardiologist (physician) and Advanced Practice Providers (APPs -  Physician Assistants and Nurse Practitioners) who all work together to provide you with the care you need, when you need it. You will need a follow up appointment in 1 months with Dr Harriet Masson Any Other Special Instructions Will Be Listed Below (If Applicable).

## 2019-02-26 NOTE — Patient Instructions (Addendum)
Restart the Omeprazole and keep taking the Pepcid. Hold off on the Carafate for now and try over the counter Mylanta as needed for abdominal pain especially upper adominal pain.  OK to restart the Align.  Will start Ondansetron for nausea as needed Ginger can also help nausea, capsules, teas Ok to restart Vitamin D 1000 IU daily Heart Failure, Diagnosis  Heart failure is a condition in which the heart has trouble pumping blood because it has become weak or stiff. This means that the heart does not pump blood well enough for the body to stay healthy. For some people with heart failure, fluid may back up into the lungs. There may also be swelling (edema) in the lower legs. Heart failure is usually a long-term (chronic) condition. It is important for you to take good care of yourself and follow the treatment plan from your health care provider. What are the causes? This condition may be caused by:  High blood pressure (hypertension). Hypertension causes the heart muscle to work harder than normal. This makes the heart stiff or weak.  Coronary artery disease, or CAD. CAD is the buildup of cholesterol and fat (plaque) in the arteries of the heart.  Heart attack, also called myocardial infarction. This injures the heart muscle, making it hard for the heart to pump blood.  Abnormal heart valves. The valves do not open and close properly, forcing the heart to pump harder to keep the blood flowing.  Heart muscle disease (cardiomyopathy or myocarditis). This is damage to the heart muscle. It can increase the risk of heart failure.  Lung disease. The heart works harder when the lungs are not healthy.  Abnormal heart rhythms. These can lead to heart failure. What increases the risk? The risk of heart failure increases as a person ages. This condition is also more likely to develop in people who:  Are overweight.  Are female.  Smoke or chew tobacco.  Abuse alcohol or illegal drugs.  Have taken  medicines that can damage the heart, such as chemotherapy drugs.  Have diabetes.  Have abnormal heart rhythms.  Have thyroid problems.  Have low blood counts (anemia). What are the signs or symptoms? Symptoms of this condition include:  Shortness of breath with activity, such as when climbing stairs.  A cough that does not go away.  Swelling of the feet, ankles, legs, or abdomen.  Losing weight for no reason.  Trouble breathing when lying flat (orthopnea).  Waking from sleep because of the need to sit up and get more air.  Rapid heartbeat.  Tiredness (fatigue) and loss of energy.  Feeling light-headed, dizzy, or close to fainting.  Loss of appetite.  Nausea.  Waking up more often during the night to urinate (nocturia).  Confusion. How is this diagnosed? This condition is diagnosed based on:  Your medical history, symptoms, and a physical exam.  Diagnostic tests, which may include: ? Echocardiogram. ? Electrocardiogram (ECG). ? Chest X-ray. ? Blood tests. ? Exercise stress test. ? Radionuclide scans. ? Cardiac catheterization and angiogram. How is this treated? Treatment for this condition is aimed at managing the symptoms of heart failure. Medicines Treatment may include medicines that:  Help lower blood pressure by relaxing (dilating) the blood vessels. These medicines are called ACE inhibitors (angiotensin-converting enzyme) and ARBs (angiotensin receptor blockers).  Cause the kidneys to remove salt and water from the blood through urination (diuretics).  Improve heart muscle strength and prevent the heart from beating too fast (beta blockers).  Increase the  force of the heartbeat (digoxin). Healthy behavior changes     Treatment may also include making healthy lifestyle changes, such as:  Reaching and staying at a healthy weight.  Quitting smoking or chewing tobacco.  Eating heart-healthy foods.  Limiting or avoiding alcohol.  Stopping  the use of illegal drugs.  Being physically active.  Other treatments Other treatments may include:  Procedures to open blocked arteries or repair damaged valves.  Placing a pacemaker to improve heart function (cardiac resynchronization therapy).  Placing a device to treat serious abnormal heart rhythms (implantable cardioverter defibrillator, or ICD).  Placing a device to improve the pumping ability of the heart (left ventricular assist device, or LVAD).  Receiving a healthy heart from a donor (heart transplant). This is done when other treatments have not helped. Follow these instructions at home:  Manage other health conditions as told by your health care provider. These may include hypertension, diabetes, thyroid disease, or abnormal heart rhythms.  Get ongoing education and support as needed. Learn as much as you can about heart failure.  Keep all follow-up visits as told by your health care provider. This is important. Summary  Heart failure is a condition in which the heart has trouble pumping blood because it has become weak or stiff.  This condition is caused by high blood pressure and other diseases of the heart and lungs.  Symptoms of this condition include shortness of breath, tiredness (fatigue), nausea, and swelling of the feet, ankles, legs, or abdomen.  Treatments for this condition may include medicines, lifestyle changes, and surgery.  Manage other health conditions as told by your health care provider. This information is not intended to replace advice given to you by your health care provider. Make sure you discuss any questions you have with your health care provider. Document Released: 05/10/2005 Document Revised: 07/28/2018 Document Reviewed: 07/28/2018 Elsevier Patient Education  [place].

## 2019-02-28 NOTE — Assessment & Plan Note (Signed)
hgba1c acceptable, minimize simple carbs. Increase exercise as tolerated.  

## 2019-02-28 NOTE — Progress Notes (Signed)
Subjective:    Patient ID: Julie Wilkerson, female    DOB: Aug 18, 1940, 78 y.o.   MRN: [id number]  No chief complaint on file.   HPI Patient is in today for follow up after being hospitalized with congestive heart failure, hyponatremia and anemia. No fevers or chills. She is feeling very weak and anxious since returning home but her edema and sob have improved some. She is now living at her daughter's house as she tries to get better. Her swelling is some better. Denies CP/palp/HA/congestion/fevers/GI or GU c/o. Taking meds as prescribed  Past Medical History:  Diagnosis Date  . Anemia   . Anginal pain ([place])   . Anxiety   . Arthritis   . Asthma   . Breast cancer ([place])    2002, left, encapsulated, microcalcifications. lumpectomy, radtiation x 30  . Breast cancer in female [facility])   . Change in mole 06/08/2016  . Colitis   . Complication of anesthesia    VERY SENSITIVE TO ANESTHESIA   . Decreased visual acuity 01/16/2017  . Depression   . Dyspnea 11/04/2016   with asthma exacerbation only  . Dysuria 11/04/2016  . Family history of adverse reaction to anesthesia   . Gluten intolerance   . History of chicken pox   . History of Helicobacter pylori infection 08/01/2015  . Hyperlipidemia   . Hypertension   . Hypothyroid   . IBS (irritable bowel syndrome)   . Left leg pain 03/01/2017  . Low back pain 08/10/2015  . Neck pain 03/01/2017  . Osteoporosis   . Personal history of radiation therapy   . Pneumonia   . PONV (postoperative nausea and vomiting)   . RLQ discomfort 03/01/2017  . Stomach cramps   . Urinary tract infection 11/04/2016    Past Surgical History:  Procedure Laterality Date  . ABDOMINAL HYSTERECTOMY     partial  . APPENDECTOMY    . BREAST LUMPECTOMY Left 2002  . CHOLECYSTECTOMY  Age 69 or 72  . COLONOSCOPY  2017  . EYE SURGERY Left    cataract  . LEFT HEART CATH AND CORONARY ANGIOGRAPHY N/A 02/09/2019   Procedure: LEFT HEART CATH AND CORONARY ANGIOGRAPHY;   Surgeon: Nelva Bush, MD;  Location: [facility];  Service: Cardiovascular;  Laterality: N/A;  . TONSILLECTOMY    . 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 Colitis Mother   . Irritable bowel syndrome Mother   . Hypertension Father   . Hyperlipidemia Brother   . Heart attack Brother   . Stomach cancer Paternal Grandmother 41  . Hyperlipidemia Brother   . Heart attack Brother   . Hyperlipidemia Brother   . Heart attack Brother   . Hyperlipidemia Sister   . Leukemia Daughter   . Arthritis Daughter   . Heart disease Daughter        ASD vs VSD  . Colon cancer Neg Hx     Social History   Socioeconomic History  . Marital status: Single    Spouse name: Not on file  . Number of children: 3  . Years of education: Not on file  . Highest education level: Master's degree (e.g., MA, MS, MEng, MEd, MSW, MBA)  Occupational History  . Occupation: retired    Comment: Pharmacist, hospital, kindergarten  Social Needs  . Financial resource strain: Not on file  . Food insecurity    Worry: Not on file    Inability: Not on file  . Transportation needs    Medical: Not on file    Non-medical: Not on file  Tobacco Use  . Smoking status: Never Smoker  . Smokeless tobacco: Never Used  Substance and Sexual Activity  . Alcohol use: Never    Alcohol/week: 0.0 standard drinks    Frequency: Never  . Drug use: Never  . Sexual activity: Not Currently    Birth control/protection: None    Comment: lives alone, avoids daiiry and gluten. 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 Physical activity    Days per week: Not on file    Minutes per session: Not on file  . Stress: Not on file  Relationships  . Social Herbalist on phone: Not on file    Gets together: Not on file    Attends  religious service: Not on file    Active member of club or organization: Not on file    Attends meetings of clubs or organizations: Not on file    Relationship status: Not on file  . Intimate partner violence    Fear of current or ex partner: Not on file    Emotionally abused: Not on file    Physically abused: Not on file    Forced sexual activity: Not on file  Other Topics Concern  . Not on file  Social History Narrative  . Not on file    Outpatient Medications Prior to Visit  Medication Sig Dispense Refill  . aspirin (ASPIRIN CHILDRENS) 81 MG chewable tablet Chew 1 tablet (81 mg total) by mouth 2 (two) times daily. Take for 4 weeks, then resume regular dose. 60 tablet 0  . atorvastatin (LIPITOR) 20 MG tablet Take 1 tablet (20 mg total) by mouth daily. 90 tablet 3  . docusate sodium (COLACE) 100 MG capsule Take 1 capsule (100 mg total) by mouth 2 (two) times daily. 28 capsule 0  . famotidine (PEPCID) 20 MG tablet Take 1 tablet (20 mg total) by mouth 2 (two) times daily. (Patient taking differently: Take 20 mg by mouth daily. ) 60 tablet 3  . ferrous sulfate (FERROUSUL) 325 (65 FE) MG tablet Take 1 tablet (325 mg total) by mouth 3 (three) times daily with meals for 14 days. 42 tablet 0  . fluticasone (FLOVENT HFA) 110 MCG/ACT inhaler Inhale 1 puff into the lungs 2 (two) times daily. (Patient taking differently: Inhale 1 puff into the lungs 2 (two) times daily as needed (shortness of breath or wheezing). ) 1 Inhaler 12  . furosemide (LASIX) 20 MG tablet Take 2 tablets (40 mg total) by mouth daily. 30 tablet 1  . hyoscyamine (LEVSIN SL) 0.125 MG SL tablet Place 1 tablet (0.125 mg total) under the tongue every 4 (four) hours as needed for cramping. 30 tablet 1  . levothyroxine (SYNTHROID, LEVOTHROID) 50 MCG tablet Take 1 tablet (50 mcg total) by mouth daily. 90 tablet 1  . lisinopril (ZESTRIL) 5 MG tablet Take 1 tablet (5 mg total) by mouth daily. 30 tablet 0  . methocarbamol (ROBAXIN) 500 MG  tablet Take 1 tablet (500 mg total) by mouth every 6 (six) hours as needed for muscle spasms. 40 tablet 0  . metoprolol succinate (TOPROL-XL) 25 MG 24 hr tablet Take 1 tablet (25 mg total) by mouth daily. (Patient taking differently: Take 25 mg by  mouth at bedtime. ) 30 tablet 11  . omega-3 acid ethyl esters (LOVAZA) 1 g capsule Take 2 g by mouth daily.    . polyethylene glycol (MIRALAX / GLYCOLAX) 17 g packet Take 17 g by mouth 2 (two) times daily. (Patient taking differently: Take 17 g by mouth daily. ) 28 packet 0  . potassium chloride (K-DUR) 10 MEQ tablet Take 1 tablet (10 mEq total) by mouth daily. 30 tablet 1  . primidone (MYSOLINE) 50 MG tablet Take 1 tablet (50 mg total) by mouth at bedtime. 90 tablet 1  . senna-docusate (SENOKOT-S) 8.6-50 MG tablet Take 2 tablets by mouth 2 (two) times daily.    . budesonide-formoterol (SYMBICORT) 160-4.5 MCG/ACT inhaler Inhale 2 puffs into the lungs 2 (two) times daily as needed (wheezing).     No facility-administered medications prior to visit.     No Known Allergies  Review of Systems  Constitutional: Positive for malaise/fatigue. Negative for fever.  HENT: Negative for congestion.   Eyes: Negative for blurred vision.  Respiratory: Positive for shortness of breath. Negative for wheezing.   Cardiovascular: Positive for leg swelling. Negative for chest pain and palpitations.  Gastrointestinal: Negative for abdominal pain, blood in stool and nausea.  Genitourinary: Negative for dysuria and frequency.  Musculoskeletal: Negative for falls.  Skin: Negative for rash.  Neurological: Positive for tremors. Negative for dizziness, loss of consciousness and headaches.  Endo/Heme/Allergies: Negative for environmental allergies.  Psychiatric/Behavioral: Negative for depression. The patient is nervous/anxious.        Objective:    Physical Exam Vitals signs and nursing note reviewed.  Constitutional:      General: She is not in acute distress.     Appearance: She is well-developed.  HENT:     Head: Normocephalic and atraumatic.     Nose: Nose normal.  Eyes:     General:        Right eye: No discharge.        Left eye: No discharge.  Neck:     Musculoskeletal: Normal range of motion and neck supple.  Cardiovascular:     Rate and Rhythm: Normal rate and regular rhythm.     Heart sounds: No murmur.  Pulmonary:     Effort: Pulmonary effort is normal.     Breath sounds: Normal breath sounds.  Abdominal:     General: Bowel sounds are normal.     Palpations: Abdomen is soft.     Tenderness: There is no abdominal tenderness.  Musculoskeletal:     Right lower leg: Edema present.     Left lower leg: Edema present.  Skin:    General: Skin is warm and dry.  Neurological:     Mental Status: She is alert and oriented to person, place, and time.     BP (!) 124/58 (BP Location: Left Arm, Patient Position: Sitting, Cuff Size: Normal)   Pulse (!) 56   Temp (!) 97.2 F (36.2 C) (Temporal)   Resp 18   Wt 126 lb 6.4 oz (57.3 kg)   SpO2 97%   BMI 26.42 kg/m  Wt Readings from Last 3 Encounters:  02/26/19 124 lb (56.2 kg)  02/26/19 126 lb 6.4 oz (57.3 kg)  02/24/19 125 lb 8 oz (56.9 kg)    Diabetic Foot Exam - Simple   No data filed     Lab Results  Component Value Date   WBC 6.6 02/26/2019   HGB 10.9 (L) 02/26/2019   HCT 32.7 (L) 02/26/2019  PLT 428.0 (H) 02/26/2019   GLUCOSE 110 (H) 02/26/2019   CHOL 238 (H) 02/01/2019   TRIG 188.0 (H) 02/01/2019   HDL 59.40 02/01/2019   LDLCALC 141 (H) 02/01/2019   ALT 24 02/26/2019   AST 26 02/26/2019   NA 133 (L) 02/26/2019   K 4.9 02/26/2019   CL 96 02/26/2019   CREATININE 1.08 02/26/2019   BUN 29 (H) 02/26/2019   CO2 27 02/26/2019   TSH 1.781 02/16/2019   INR 1.2 02/16/2019   HGBA1C 6.2 02/01/2019    Lab Results  Component Value Date   TSH 1.781 02/16/2019   Lab Results  Component Value Date   WBC 6.6 02/26/2019   HGB 10.9 (L) 02/26/2019   HCT 32.7 (L)  02/26/2019   MCV 87.0 02/26/2019   PLT 428.0 (H) 02/26/2019   Lab Results  Component Value Date   NA 133 (L) 02/26/2019   K 4.9 02/26/2019   CO2 27 02/26/2019   GLUCOSE 110 (H) 02/26/2019   BUN 29 (H) 02/26/2019   CREATININE 1.08 02/26/2019   BILITOT 0.4 02/26/2019   ALKPHOS 80 02/26/2019   AST 26 02/26/2019   ALT 24 02/26/2019   PROT 6.9 02/26/2019   ALBUMIN 4.0 02/26/2019   CALCIUM 8.8 02/26/2019   ANIONGAP 11 02/24/2019   GFR 49.16 (L) 02/26/2019   Lab Results  Component Value Date   CHOL 238 (H) 02/01/2019   Lab Results  Component Value Date   HDL 59.40 02/01/2019   Lab Results  Component Value Date   LDLCALC 141 (H) 02/01/2019   Lab Results  Component Value Date   TRIG 188.0 (H) 02/01/2019   Lab Results  Component Value Date   CHOLHDL 4 02/01/2019   Lab Results  Component Value Date   HGBA1C 6.2 02/01/2019       Assessment & Plan:   Problem List Items Addressed This Visit    Essential hypertension - Primary (Chronic)    Well controlled, no changes to meds. Encouraged heart healthy diet such as the DASH diet and exercise as tolerated.       Relevant Orders   Comprehensive metabolic panel (Completed)   Chronic bilateral thoracic back pain   Hyperglycemia    hgba1c acceptable, minimize simple carbs. Increase exercise as tolerated.       Bilateral carotid bruits   Relevant Orders   Sedimentation rate (Completed)   Influenza due to identified influenza virus   Elevated sed rate   Relevant Orders   CBC w/Diff (Completed)   Sedimentation rate (Completed)   Anemia    Improving since hospitalizations. Continue to monitor and no change in therapy      Constipation   Acute diastolic CHF (congestive heart failure) [facility])    Patient is here today for hospital follow up. She is home living with her daughter at this time. She is still week and struggling with SOB but it has improved since hospitalizations. Repeat BNP, CMP, CBC today. Follow up with  cardiology as well. Report any concerns.       Hyponatremia    Checked today and improved from hospitalization will continue to monitor      CHF (congestive heart failure) ([place])   Relevant Orders   CBC w/Diff (Completed)   B Nat Peptide (Completed)      I have discontinued Van Clines. Constantine's budesonide-formoterol. I am also having her start on ondansetron and albuterol. Additionally, I am having her maintain her metoprolol succinate, levothyroxine, hyoscyamine, primidone, fluticasone, omega-3  acid ethyl esters, famotidine, atorvastatin, aspirin, ferrous sulfate, docusate sodium, polyethylene glycol, methocarbamol, senna-docusate, potassium chloride, furosemide, and lisinopril.  Meds ordered this encounter  Medications  . ondansetron (ZOFRAN) 4 MG tablet    Sig: Take 1 tablet (4 mg total) by mouth every 8 (eight) hours as needed for nausea or vomiting.    Dispense:  30 tablet    Refill:  1  . albuterol (VENTOLIN HFA) 108 (90 Base) MCG/ACT inhaler    Sig: Inhale 2 puffs into the lungs every 6 (six) hours as needed for wheezing or shortness of breath.    Dispense:  18 g    Refill:  2     Penni Homans, MD

## 2019-02-28 NOTE — Assessment & Plan Note (Signed)
Patient is here today for hospital follow up. She is home living with her daughter at this time. She is still week and struggling with SOB but it has improved since hospitalizations. Repeat BNP, CMP, CBC today. Follow up with cardiology as well. Report any concerns.

## 2019-02-28 NOTE — Assessment & Plan Note (Signed)
Improving since hospitalizations. Continue to monitor and no change in therapy

## 2019-02-28 NOTE — Assessment & Plan Note (Signed)
Checked today and improved from hospitalization will continue to monitor

## 2019-02-28 NOTE — Assessment & Plan Note (Signed)
Well controlled, no changes to meds. Encouraged heart healthy diet such as the DASH diet and exercise as tolerated.  °

## 2019-03-12 ENCOUNTER — Other Ambulatory Visit: Payer: Self-pay

## 2019-03-12 ENCOUNTER — Ambulatory Visit (INDEPENDENT_AMBULATORY_CARE_PROVIDER_SITE_OTHER): Payer: Medicare HMO | Admitting: Family Medicine

## 2019-03-12 DIAGNOSIS — R739 Hyperglycemia, unspecified: Secondary | ICD-10-CM

## 2019-03-12 DIAGNOSIS — I10 Essential (primary) hypertension: Secondary | ICD-10-CM

## 2019-03-12 DIAGNOSIS — E559 Vitamin D deficiency, unspecified: Secondary | ICD-10-CM | POA: Diagnosis not present

## 2019-03-12 DIAGNOSIS — E039 Hypothyroidism, unspecified: Secondary | ICD-10-CM

## 2019-03-12 DIAGNOSIS — D649 Anemia, unspecified: Secondary | ICD-10-CM

## 2019-03-12 DIAGNOSIS — R109 Unspecified abdominal pain: Secondary | ICD-10-CM

## 2019-03-12 DIAGNOSIS — F32A Depression, unspecified: Secondary | ICD-10-CM

## 2019-03-12 DIAGNOSIS — R69 Illness, unspecified: Secondary | ICD-10-CM | POA: Diagnosis not present

## 2019-03-12 DIAGNOSIS — E871 Hypo-osmolality and hyponatremia: Secondary | ICD-10-CM

## 2019-03-12 DIAGNOSIS — F419 Anxiety disorder, unspecified: Secondary | ICD-10-CM

## 2019-03-12 DIAGNOSIS — F329 Major depressive disorder, single episode, unspecified: Secondary | ICD-10-CM

## 2019-03-12 MED ORDER — OMEPRAZOLE 20 MG PO CPDR: 20.0000 mg | DELAYED_RELEASE_CAPSULE | Freq: Every day | ORAL | 1 refills | Status: DC

## 2019-03-12 MED ORDER — CITALOPRAM HYDROBROMIDE 20 MG PO TABS: 20.0000 mg | ORAL_TABLET | Freq: Every day | ORAL | 1 refills | Status: DC

## 2019-03-12 MED ORDER — LISINOPRIL 5 MG PO TABS: 5.0000 mg | ORAL_TABLET | Freq: Two times a day (BID) | ORAL | 2 refills | Status: DC

## 2019-03-12 MED ORDER — FUROSEMIDE 40 MG PO TABS: 40.0000 mg | ORAL_TABLET | Freq: Every day | ORAL | 1 refills | Status: DC

## 2019-03-18 ENCOUNTER — Encounter: Payer: Self-pay | Admitting: Family Medicine

## 2019-03-18 DIAGNOSIS — F329 Major depressive disorder, single episode, unspecified: Secondary | ICD-10-CM | POA: Insufficient documentation

## 2019-03-18 NOTE — Assessment & Plan Note (Signed)
hgba1c acceptable, minimize simple carbs. Increase exercise as tolerated.  

## 2019-03-18 NOTE — Assessment & Plan Note (Signed)
Supplement and monitor 

## 2019-03-18 NOTE — Assessment & Plan Note (Signed)
Likely mutifactorial will monitor

## 2019-03-18 NOTE — Assessment & Plan Note (Signed)
Well controlled, no changes to meds. Encouraged heart healthy diet such as the DASH diet and exercise as tolerated.  °

## 2019-03-18 NOTE — Assessment & Plan Note (Signed)
Greatly improved but still occurs. Still may try hyoscyamine. She feels her symptoms are improving.

## 2019-03-18 NOTE — Assessment & Plan Note (Signed)
Mild. Repeat cmp ordered

## 2019-03-18 NOTE — Assessment & Plan Note (Signed)
She is doing some better now and is living with family, take Citalopram daily and report any new concerns.

## 2019-03-18 NOTE — Progress Notes (Signed)
Subjective:    Patient ID: Julie Wilkerson, female    DOB: Apr 09, 1941, 78 y.o.   MRN: [id number]  No chief complaint on file. Patient was able to get on a phone visit after not being able to get on a video visit. CMA was able to get the visit set up. She understands the limitations of a phone visit but wishes to proceed.  HPI Patient is in today for follow up on chronic medical concerns including hypertension, hyperlipidemia, abdominal pain, anxiety, anemia and more. She has moved in with family and that has helped her anxiety some. No recent febrile illness or hospitalizations. No new headache. Denies CP/palp/SOB/HA/congestion/fevers/GI or GU c/o. Taking meds as prescribed  Past Medical History:  Diagnosis Date  . Anemia   . Anginal pain ([place])   . Anxiety   . Anxiety and depression 03/18/2019  . Arthritis   . Asthma   . Breast cancer ([place])    2002, left, encapsulated, microcalcifications. lumpectomy, radtiation x 30  . Breast cancer in female [facility])   . Change in mole 06/08/2016  . Colitis   . Complication of anesthesia    VERY SENSITIVE TO ANESTHESIA   . Decreased visual acuity 01/16/2017  . Depression   . Dyspnea 11/04/2016   with asthma exacerbation only  . Dysuria 11/04/2016  . Family history of adverse reaction to anesthesia   . Gluten intolerance   . History of chicken pox   . History of Helicobacter pylori infection 08/01/2015  . Hyperlipidemia   . Hypertension   . Hypothyroid   . IBS (irritable bowel syndrome)   . Left leg pain 03/01/2017  . Low back pain 08/10/2015  . Neck pain 03/01/2017  . Osteoporosis   . Personal history of radiation therapy   . Pneumonia   . PONV (postoperative nausea and vomiting)   . RLQ discomfort 03/01/2017  . Stomach cramps   . Urinary tract infection 11/04/2016    Past Surgical History:  Procedure Laterality Date  . ABDOMINAL HYSTERECTOMY     partial  . APPENDECTOMY    . BREAST LUMPECTOMY Left 2002  . CHOLECYSTECTOMY  Age 56 or 55   . COLONOSCOPY  2017  . EYE SURGERY Left    cataract  . LEFT HEART CATH AND CORONARY ANGIOGRAPHY N/A 02/09/2019   Procedure: LEFT HEART CATH AND CORONARY ANGIOGRAPHY;  Surgeon: Nelva Bush, MD;  Location: [place] [facility];  Service: Cardiovascular;  Laterality: N/A;  . TONSILLECTOMY    . 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 Colitis Mother   . Irritable bowel syndrome Mother   . Hypertension Father   . Hyperlipidemia Brother   . Heart attack Brother   . Stomach cancer Paternal Grandmother 49  . Hyperlipidemia Brother   . Heart attack Brother   . Hyperlipidemia Brother   . Heart attack Brother   . Hyperlipidemia Sister   . Leukemia Daughter   . Arthritis Daughter   . Heart disease Daughter        ASD vs VSD  . Colon cancer Neg Hx     Social History   Socioeconomic History  . Marital status: Single    Spouse name: Not on file  . Number of children: 3  . Years of education: Not on  file  . Highest education level: Master's degree (e.g., MA, MS, MEng, MEd, MSW, MBA)  Occupational History  . Occupation: retired    Comment: Pharmacist, hospital, kindergarten  Social Needs  . Financial resource strain: Not on file  . Food insecurity    Worry: Not on file    Inability: Not on file  . Transportation needs    Medical: Not on file    Non-medical: Not on file  Tobacco Use  . Smoking status: Never Smoker  . Smokeless tobacco: Never Used  Substance and Sexual Activity  . Alcohol use: Never    Alcohol/week: 0.0 standard drinks    Frequency: Never  . Drug use: Never  . Sexual activity: Not Currently    Birth control/protection: None    Comment: lives alone, avoids daiiry and gluten. 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 Physical activity    Days per week: Not on file     Minutes per session: Not on file  . Stress: Not on file  Relationships  . Social Herbalist on phone: Not on file    Gets together: Not on file    Attends religious service: Not on file    Active member of club or organization: Not on file    Attends meetings of clubs or organizations: Not on file    Relationship status: Not on file  . Intimate partner violence    Fear of current or ex partner: Not on file    Emotionally abused: Not on file    Physically abused: Not on file    Forced sexual activity: Not on file  Other Topics Concern  . Not on file  Social History Narrative  . Not on file    Outpatient Medications Prior to Visit  Medication Sig Dispense Refill  . albuterol (VENTOLIN HFA) 108 (90 Base) MCG/ACT inhaler Inhale 2 puffs into the lungs every 6 (six) hours as needed for wheezing or shortness of breath. 18 g 2  . aspirin (ASPIRIN CHILDRENS) 81 MG chewable tablet Chew 1 tablet (81 mg total) by mouth 2 (two) times daily. Take for 4 weeks, then resume regular dose. 60 tablet 0  . atorvastatin (LIPITOR) 20 MG tablet Take 1 tablet (20 mg total) by mouth daily. 90 tablet 3  . docusate sodium (COLACE) 100 MG capsule Take 1 capsule (100 mg total) by mouth 2 (two) times daily. 28 capsule 0  . famotidine (PEPCID) 20 MG tablet Take 1 tablet (20 mg total) by mouth 2 (two) times daily. (Patient taking differently: Take 20 mg by mouth daily. ) 60 tablet 3  . ferrous sulfate (FERROUSUL) 325 (65 FE) MG tablet Take 1 tablet (325 mg total) by mouth 3 (three) times daily with meals for 14 days. 42 tablet 0  . fluticasone (FLOVENT HFA) 110 MCG/ACT inhaler Inhale 1 puff into the lungs 2 (two) times daily. (Patient taking differently: Inhale 1 puff into the lungs 2 (two) times daily as needed (shortness of breath or wheezing). ) 1 Inhaler 12  . hyoscyamine (LEVSIN SL) 0.125 MG SL tablet Place 1 tablet (0.125 mg total) under the tongue every 4 (four) hours as needed for cramping. 30 tablet  1  . levothyroxine (SYNTHROID, LEVOTHROID) 50 MCG tablet Take 1 tablet (50 mcg total) by mouth daily. 90 tablet 1  . methocarbamol (ROBAXIN) 500 MG tablet Take 1 tablet (500 mg total) by mouth every 6 (six) hours as needed for muscle spasms. 40 tablet 0  . metoprolol succinate (  TOPROL-XL) 25 MG 24 hr tablet Take 1 tablet (25 mg total) by mouth daily. (Patient taking differently: Take 25 mg by mouth at bedtime. ) 30 tablet 11  . omega-3 acid ethyl esters (LOVAZA) 1 g capsule Take 2 g by mouth daily.    . ondansetron (ZOFRAN) 4 MG tablet Take 1 tablet (4 mg total) by mouth every 8 (eight) hours as needed for nausea or vomiting. 30 tablet 1  . polyethylene glycol (MIRALAX / GLYCOLAX) 17 g packet Take 17 g by mouth 2 (two) times daily. (Patient taking differently: Take 17 g by mouth daily. ) 28 packet 0  . potassium chloride (K-DUR) 10 MEQ tablet Take 1 tablet (10 mEq total) by mouth daily. 30 tablet 1  . primidone (MYSOLINE) 50 MG tablet Take 1 tablet (50 mg total) by mouth at bedtime. 90 tablet 1  . senna-docusate (SENOKOT-S) 8.6-50 MG tablet Take 2 tablets by mouth 2 (two) times daily.    . furosemide (LASIX) 20 MG tablet Take 2 tablets (40 mg total) by mouth daily. 30 tablet 1  . lisinopril (ZESTRIL) 5 MG tablet Take 1 tablet (5 mg total) by mouth daily. 30 tablet 0   No facility-administered medications prior to visit.     No Known Allergies  Review of Systems  Constitutional: Positive for malaise/fatigue. Negative for fever.  HENT: Negative for congestion.   Eyes: Negative for blurred vision.  Respiratory: Negative for shortness of breath.   Cardiovascular: Negative for chest pain, palpitations and leg swelling.  Gastrointestinal: Positive for abdominal pain. Negative for blood in stool, melena and nausea.  Genitourinary: Negative for dysuria and frequency.  Musculoskeletal: Positive for back pain and myalgias. Negative for falls.  Skin: Negative for rash.  Neurological: Negative for  dizziness, loss of consciousness and headaches.  Endo/Heme/Allergies: Negative for environmental allergies.  Psychiatric/Behavioral: Negative for depression. The patient is nervous/anxious.        Objective:    Physical Exam Vitals signs and nursing note reviewed.  Constitutional:      General: She is not in acute distress.    Appearance: She is well-developed.  HENT:     Head: Normocephalic and atraumatic.     Nose: Nose normal.  Eyes:     General:        Right eye: No discharge.        Left eye: No discharge.  Neck:     Musculoskeletal: Normal range of motion and neck supple.  Cardiovascular:     Rate and Rhythm: Normal rate and regular rhythm.     Heart sounds: No murmur.  Pulmonary:     Effort: Pulmonary effort is normal.     Breath sounds: Normal breath sounds.  Abdominal:     General: Bowel sounds are normal.     Palpations: Abdomen is soft.     Tenderness: There is no abdominal tenderness.  Skin:    General: Skin is warm and dry.  Neurological:     Mental Status: She is alert and oriented to person, place, and time.     There were no vitals taken for this visit. Wt Readings from Last 3 Encounters:  02/26/19 124 lb (56.2 kg)  02/26/19 126 lb 6.4 oz (57.3 kg)  02/24/19 125 lb 8 oz (56.9 kg)    Diabetic Foot Exam - Simple   No data filed     Lab Results  Component Value Date   WBC 6.6 02/26/2019   HGB 10.9 (L) 02/26/2019   HCT  32.7 (L) 02/26/2019   PLT 428.0 (H) 02/26/2019   GLUCOSE 110 (H) 02/26/2019   CHOL 238 (H) 02/01/2019   TRIG 188.0 (H) 02/01/2019   HDL 59.40 02/01/2019   LDLCALC 141 (H) 02/01/2019   ALT 24 02/26/2019   AST 26 02/26/2019   NA 133 (L) 02/26/2019   K 4.9 02/26/2019   CL 96 02/26/2019   CREATININE 1.08 02/26/2019   BUN 29 (H) 02/26/2019   CO2 27 02/26/2019   TSH 1.781 02/16/2019   INR 1.2 02/16/2019   HGBA1C 6.2 02/01/2019    Lab Results  Component Value Date   TSH 1.781 02/16/2019   Lab Results  Component Value  Date   WBC 6.6 02/26/2019   HGB 10.9 (L) 02/26/2019   HCT 32.7 (L) 02/26/2019   MCV 87.0 02/26/2019   PLT 428.0 (H) 02/26/2019   Lab Results  Component Value Date   NA 133 (L) 02/26/2019   K 4.9 02/26/2019   CO2 27 02/26/2019   GLUCOSE 110 (H) 02/26/2019   BUN 29 (H) 02/26/2019   CREATININE 1.08 02/26/2019   BILITOT 0.4 02/26/2019   ALKPHOS 80 02/26/2019   AST 26 02/26/2019   ALT 24 02/26/2019   PROT 6.9 02/26/2019   ALBUMIN 4.0 02/26/2019   CALCIUM 8.8 02/26/2019   ANIONGAP 11 02/24/2019   GFR 49.16 (L) 02/26/2019   Lab Results  Component Value Date   CHOL 238 (H) 02/01/2019   Lab Results  Component Value Date   HDL 59.40 02/01/2019   Lab Results  Component Value Date   LDLCALC 141 (H) 02/01/2019   Lab Results  Component Value Date   TRIG 188.0 (H) 02/01/2019   Lab Results  Component Value Date   CHOLHDL 4 02/01/2019   Lab Results  Component Value Date   HGBA1C 6.2 02/01/2019       Assessment & Plan:   Problem List Items Addressed This Visit    Essential hypertension - Primary (Chronic)    Well controlled, no changes to meds. Encouraged heart healthy diet such as the DASH diet and exercise as tolerated.       Relevant Medications   lisinopril (ZESTRIL) 5 MG tablet   furosemide (LASIX) 40 MG tablet   Other Relevant Orders   Comprehensive metabolic panel   Hypothyroid   Vitamin D deficiency    Supplement and monitor      Hyperglycemia    hgba1c acceptable, minimize simple carbs. Increase exercise as tolerated.       Abdominal pain    Greatly improved but still occurs. Still may try hyoscyamine. She feels her symptoms are improving.       Anemia    Likely mutifactorial will monitor      Hyponatremia    Mild. Repeat cmp ordered      Anxiety and depression    She is doing some better now and is living with family, take Citalopram daily and report any new concerns.       Relevant Medications   citalopram (CELEXA) 20 MG tablet       I have discontinued [place] Clines. [place]'s furosemide. I have also changed her lisinopril, citalopram, and omeprazole. Additionally, I am having her start on furosemide. Lastly, I am having her maintain her metoprolol succinate, levothyroxine, hyoscyamine, primidone, fluticasone, omega-3 acid ethyl esters, famotidine, atorvastatin, ferrous sulfate, docusate sodium, polyethylene glycol, methocarbamol, senna-docusate, potassium chloride, ondansetron, and albuterol.  Meds ordered this encounter  Medications  . lisinopril (ZESTRIL) 5 MG tablet  Sig: Take 1 tablet (5 mg total) by mouth 2 (two) times daily.    Dispense:  60 tablet    Refill:  2  . furosemide (LASIX) 40 MG tablet    Sig: Take 1 tablet (40 mg total) by mouth daily.    Dispense:  90 tablet    Refill:  1  . citalopram (CELEXA) 20 MG tablet    Sig: Take 1 tablet (20 mg total) by mouth at bedtime.    Dispense:  90 tablet    Refill:  1  . omeprazole (PRILOSEC) 20 MG capsule    Sig: Take 1 capsule (20 mg total) by mouth daily. Take 1 tab by mouth before breakfast and dinner.    Dispense:  90 capsule    Refill:  1   Visit was completed virtually and was 25 minutes long.   Penni Homans, MD

## 2019-03-19 ENCOUNTER — Telehealth: Payer: Self-pay | Admitting: *Deleted

## 2019-03-19 NOTE — Telephone Encounter (Signed)
Yes chang her to Omeprazole 40 mg po qd, disp #30 with 4 rf.

## 2019-03-19 NOTE — Telephone Encounter (Signed)
[facility] [address] sent over fax stating insurance will not cover omeprazole more that 1 capsule a day.  Would you like to change to 40mg  qd?  (it look like you had 2 different sigs: 1 qd and 1 bid)

## 2019-03-20 ENCOUNTER — Other Ambulatory Visit (INDEPENDENT_AMBULATORY_CARE_PROVIDER_SITE_OTHER): Payer: Medicare HMO

## 2019-03-20 ENCOUNTER — Other Ambulatory Visit: Payer: Self-pay

## 2019-03-20 DIAGNOSIS — I10 Essential (primary) hypertension: Secondary | ICD-10-CM | POA: Diagnosis not present

## 2019-03-20 LAB — COMPREHENSIVE METABOLIC PANEL
ALT: 17 U/L (ref 0–35)
AST: 22 U/L (ref 0–37)
Albumin: 4.3 g/dL (ref 3.5–5.2)
Alkaline Phosphatase: 93 U/L (ref 39–117)
BUN: 26 mg/dL — ABNORMAL HIGH (ref 6–23)
CO2: 26 mEq/L (ref 19–32)
Calcium: 8.9 mg/dL (ref 8.4–10.5)
Chloride: 97 mEq/L (ref 96–112)
Creatinine, Ser: 1.09 mg/dL (ref 0.40–1.20)
GFR: 48.63 mL/min — ABNORMAL LOW (ref >60.00)
Glucose, Bld: 125 mg/dL — ABNORMAL HIGH (ref 70–99)
Potassium: 4.4 mEq/L (ref 3.5–5.1)
Sodium: 132 mEq/L — ABNORMAL LOW (ref 135–145)
Total Bilirubin: 0.5 mg/dL (ref 0.2–1.2)
Total Protein: 7 g/dL (ref 6.0–8.3)

## 2019-03-20 MED ORDER — OMEPRAZOLE 40 MG PO CPDR: 40.0000 mg | DELAYED_RELEASE_CAPSULE | Freq: Every day | ORAL | 4 refills | Status: DC

## 2019-03-20 NOTE — Telephone Encounter (Signed)
rx sent in 

## 2019-03-28 DIAGNOSIS — Z96642 Presence of left artificial hip joint: Secondary | ICD-10-CM | POA: Diagnosis not present

## 2019-03-28 DIAGNOSIS — Z471 Aftercare following joint replacement surgery: Secondary | ICD-10-CM | POA: Diagnosis not present

## 2019-03-30 ENCOUNTER — Encounter: Payer: Medicare HMO | Admitting: Psychology

## 2019-04-03 ENCOUNTER — Encounter: Payer: Self-pay | Admitting: Cardiology

## 2019-04-03 ENCOUNTER — Ambulatory Visit (INDEPENDENT_AMBULATORY_CARE_PROVIDER_SITE_OTHER): Payer: Medicare HMO | Admitting: Cardiology

## 2019-04-03 ENCOUNTER — Ambulatory Visit (INDEPENDENT_AMBULATORY_CARE_PROVIDER_SITE_OTHER): Payer: Medicare HMO

## 2019-04-03 ENCOUNTER — Ambulatory Visit (INDEPENDENT_AMBULATORY_CARE_PROVIDER_SITE_OTHER): Payer: Medicare HMO | Admitting: Family Medicine

## 2019-04-03 ENCOUNTER — Other Ambulatory Visit: Payer: Self-pay

## 2019-04-03 VITALS — BP 114/62 | HR 53 | Ht <= 58 in | Wt 126.0 lb

## 2019-04-03 DIAGNOSIS — R002 Palpitations: Secondary | ICD-10-CM

## 2019-04-03 DIAGNOSIS — A419 Sepsis, unspecified organism: Secondary | ICD-10-CM

## 2019-04-03 DIAGNOSIS — D62 Acute posthemorrhagic anemia: Secondary | ICD-10-CM

## 2019-04-03 DIAGNOSIS — E559 Vitamin D deficiency, unspecified: Secondary | ICD-10-CM | POA: Diagnosis not present

## 2019-04-03 DIAGNOSIS — J189 Pneumonia, unspecified organism: Secondary | ICD-10-CM | POA: Diagnosis not present

## 2019-04-03 DIAGNOSIS — R739 Hyperglycemia, unspecified: Secondary | ICD-10-CM

## 2019-04-03 DIAGNOSIS — I10 Essential (primary) hypertension: Secondary | ICD-10-CM

## 2019-04-03 DIAGNOSIS — I5032 Chronic diastolic (congestive) heart failure: Secondary | ICD-10-CM

## 2019-04-03 NOTE — Progress Notes (Signed)
Cardiology Office Note:    Date:  04/03/2019   ID:  Mailey Betro, DOB 1941-03-30, MRN [id number]  PCP:  [name], MD  Cardiologist:  Berniece Salines, DO  Electrophysiologist:  None   Referring MD: [name], MD   Chief Complaint  Patient presents with  . Follow-up    History of Present Illness:    Julie Wilkerson is a 78 y.o. female with a hx of hypertension, mild to moderate coronary artery disease, hyperlipidemia, chronic diastolic heart failure presents for a follow-up visit today.  I last saw the patient February 26, 2019 at that time she was status post hip surgery on 02/13/2019.  Post surgery her recovery was complicated by acute on chronic diastolic heart failure due to not being on Lasix post surgical discharge.  At that visit she was going from Lasix 40 mg daily.  I did keep patient on her Lasix and ask for a follow-up visit.   In the interim she was able to see her PCP.  Today she is here for follow-up visit she looks great.  He has recovered well from her surgery and completed therapy.  She is using her cane tells me at home she really does not need the cane as she has had good muscle strength.    She reports that since her surgery she has been experiencing intermittent palpitations. She describes this as a abrupt onset of fast heart beat. She reports that it use to occur sparingly and now it is more frequent.   No other complaints at this time.  Past Medical History:  Diagnosis Date  . Anemia   . Anginal pain ([place])   . Anxiety   . Anxiety and depression 03/18/2019  . Arthritis   . Asthma   . Breast cancer ([place])    2002, left, encapsulated, microcalcifications. lumpectomy, radtiation x 30  . Breast cancer in female [facility])   . Change in mole 06/08/2016  . Colitis   . Complication of anesthesia    VERY SENSITIVE TO ANESTHESIA   . Decreased visual acuity 01/16/2017  . Depression   . Dyspnea 11/04/2016   with asthma exacerbation only  . Dysuria  11/04/2016  . Family history of adverse reaction to anesthesia   . Gluten intolerance   . History of chicken pox   . History of Helicobacter pylori infection 08/01/2015  . Hyperlipidemia   . Hypertension   . Hypothyroid   . IBS (irritable bowel syndrome)   . Left leg pain 03/01/2017  . Low back pain 08/10/2015  . Neck pain 03/01/2017  . Osteoporosis   . Personal history of radiation therapy   . Pneumonia   . PONV (postoperative nausea and vomiting)   . RLQ discomfort 03/01/2017  . Stomach cramps   . Urinary tract infection 11/04/2016    Past Surgical History:  Procedure Laterality Date  . ABDOMINAL HYSTERECTOMY     partial  . APPENDECTOMY    . BREAST LUMPECTOMY Left 2002  . CHOLECYSTECTOMY  Age 8 or 10  . COLONOSCOPY  2017  . EYE SURGERY Left    cataract  . LEFT HEART CATH AND CORONARY ANGIOGRAPHY N/A 02/09/2019   Procedure: LEFT HEART CATH AND CORONARY ANGIOGRAPHY;  Surgeon: Nelva Bush, MD;  Location: [facility];  Service: Cardiovascular;  Laterality: N/A;  . TONSILLECTOMY    . TOTAL HIP ARTHROPLASTY Left 02/13/2019   Procedure: TOTAL HIP ARTHROPLASTY ANTERIOR APPROACH;  Surgeon: Paralee Cancel, MD;  Location: [facility];  Service: Orthopedics;  Laterality: Left;  70 mins    Current Medications: Current Meds  Medication Sig  . albuterol (VENTOLIN HFA) 108 (90 Base) MCG/ACT inhaler Inhale 2 puffs into the lungs every 6 (six) hours as needed for wheezing or shortness of breath.  . ASPIRIN 81 PO 81 mg 2 (two) times daily.  Marland Kitchen atorvastatin (LIPITOR) 20 MG tablet Take 1 tablet (20 mg total) by mouth daily.  . citalopram (CELEXA) 20 MG tablet Take 1 tablet (20 mg total) by mouth at bedtime.  . docusate sodium (COLACE) 100 MG capsule Take 1 capsule (100 mg total) by mouth 2 (two) times daily.  . ferrous sulfate (FERROUSUL) 325 (65 FE) MG tablet Take 1 tablet (325 mg total) by mouth 3 (three) times daily with meals for 14 days.  . fluticasone (FLOVENT HFA) 110 MCG/ACT inhaler  Inhale 1 puff into the lungs 2 (two) times daily. (Patient taking differently: Inhale 1 puff into the lungs 2 (two) times daily as needed (shortness of breath or wheezing). )  . furosemide (LASIX) 40 MG tablet Take 1 tablet (40 mg total) by mouth daily.  Marland Kitchen levothyroxine (SYNTHROID, LEVOTHROID) 50 MCG tablet Take 1 tablet (50 mcg total) by mouth daily.  Marland Kitchen lisinopril (ZESTRIL) 5 MG tablet Take 1 tablet (5 mg total) by mouth 2 (two) times daily.  . metoprolol succinate (TOPROL-XL) 25 MG 24 hr tablet Take 1 tablet (25 mg total) by mouth daily. (Patient taking differently: Take 25 mg by mouth at bedtime. )  . omega-3 acid ethyl esters (LOVAZA) 1 g capsule Take 2 g by mouth daily.  Marland Kitchen omeprazole (PRILOSEC) 40 MG capsule Take 1 capsule (40 mg total) by mouth daily.  . ondansetron (ZOFRAN) 4 MG tablet Take 1 tablet (4 mg total) by mouth every 8 (eight) hours as needed for nausea or vomiting.  . polyethylene glycol (MIRALAX / GLYCOLAX) 17 g packet Take 17 g by mouth 2 (two) times daily. (Patient taking differently: Take 17 g by mouth daily. )  . potassium chloride (K-DUR) 10 MEQ tablet Take 1 tablet (10 mEq total) by mouth daily.  . primidone (MYSOLINE) 50 MG tablet Take 1 tablet (50 mg total) by mouth at bedtime.  . senna-docusate (SENOKOT-S) 8.6-50 MG tablet Take 2 tablets by mouth 2 (two) times daily. (Patient taking differently: Take 2 tablets by mouth as needed. )     Allergies:   Patient has no known allergies.   Social History   Socioeconomic History  . Marital status: Single    Spouse name: Not on file  . Number of children: 3  . Years of education: Not on file  . Highest education level: Master's degree (e.g., MA, MS, MEng, MEd, MSW, MBA)  Occupational History  . Occupation: retired    Comment: Pharmacist, hospital, kindergarten  Social Needs  . Financial resource strain: Not on file  . Food insecurity    Worry: Not on file    Inability: Not on file  . Transportation needs    Medical: Not on file     Non-medical: Not on file  Tobacco Use  . Smoking status: Never Smoker  . Smokeless tobacco: Never Used  Substance and Sexual Activity  . Alcohol use: Never    Alcohol/week: 0.0 standard drinks    Frequency: Never  . Drug use: Never  . Sexual activity: Not Currently    Birth control/protection: None    Comment: lives alone, avoids daiiry and gluten. 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  Physical activity    Days per week: Not on file    Minutes per session: Not on file  . Stress: Not on file  Relationships  . Social Herbalist on phone: Not on file    Gets together: Not on file    Attends religious service: Not on file    Active member of club or organization: Not on file    Attends meetings of clubs or organizations: Not on file    Relationship status: Not on file  Other Topics Concern  . Not on file  Social History Narrative  . Not on file     Family History: The patient's family history includes Arthritis in her daughter; Colitis in her mother; Heart attack in her brother, brother, and brother; Heart disease in her daughter; Hyperlipidemia in her brother, brother, brother, and sister; Hypertension in her father; Irritable bowel syndrome in her mother; Leukemia in her daughter; Stomach cancer (age of onset: 101) in her paternal grandmother. There is no history of Colon cancer.  ROS:   Review of Systems  Constitution: Negative for decreased appetite, fever and weight gain.  HENT: Negative for congestion, ear discharge, hoarse voice and sore throat.   Eyes: Negative for discharge, redness, vision loss in right eye and visual halos.  Cardiovascular: Negative for chest pain, dyspnea on exertion, leg swelling, orthopnea and palpitations.  Respiratory: Negative for cough, hemoptysis, shortness of breath and snoring.   Endocrine: Negative for heat intolerance and polyphagia.  Hematologic/Lymphatic: Negative for bleeding problem. Does not bruise/bleed easily.  Skin:  Negative for flushing, nail changes, rash and suspicious lesions.  Musculoskeletal: Negative for arthritis, joint pain, muscle cramps, myalgias, neck pain and stiffness.  Gastrointestinal: Negative for abdominal pain, bowel incontinence, diarrhea and excessive appetite.  Genitourinary: Negative for decreased libido, genital sores and incomplete emptying.  Neurological: Negative for brief paralysis, focal weakness, headaches and loss of balance.  Psychiatric/Behavioral: Negative for altered mental status, depression and suicidal ideas.  Allergic/Immunologic: Negative for HIV exposure and persistent infections.    EKGs/Labs/Other Studies Reviewed:    The following studies were reviewed today:   EKG:  None today.   LHC 02/09/2019   Mild to moderate, non-obstructive coronary artery disease, including sequential 30% proximal and mid LAD stenoses, 50% ostial D1 lesion, and 20% ostial/proximal LMCA and mid RCA lesions.  No definite evidence of myocardial bridge on today's study.  Normal left ventricular filling pressure. LVEF noted to be normal on recent echocardiogram.   TTE 02/16/2019  FINDINGS Left Ventricle: Left ventricular ejection fraction, by visual estimation, is 60 to 65%. The left ventricle has normal function. There is no left ventricular hypertrophy.  Right Ventricle: The right ventricular size is normal. No increase in right ventricular wall thickness. Global RV systolic function is has normal systolic function.  Left Atrium: Left atrial size was normal in size.  Right Atrium: Right atrial size was normal in size  Pericardium: There is no evidence of pericardial effusion.  Mitral Valve: The mitral valve is normal in structure. Trace mitral valve regurgitation.  Tricuspid Valve: The tricuspid valve is normal in structure. Tricuspid valve regurgitation was not assessed by color flow Doppler.  Aortic Valve: The aortic valve is normal in structure. Aortic valve  regurgitation was not visualized by color flow Doppler.  Pulmonic Valve: The pulmonic valve was not well visualized. Pulmonic valve regurgitation was not assessed by color flow Doppler.  Aorta: The aortic root is normal in size and structure.  Shunts:  The interatrial septum was not assessed  Recent Labs: 02/16/2019: TSH 1.781 02/18/2019: Magnesium 2.0 02/22/2019: B Natriuretic Peptide 310.7 02/26/2019: Hemoglobin 10.9; Platelets 428.0; Pro B Natriuretic peptide (BNP) 62.0 03/20/2019: ALT 17; BUN 26; Creatinine, Ser 1.09; Potassium 4.4; Sodium 132  Recent Lipid Panel    Component Value Date/Time   CHOL 238 (H) 02/01/2019 1409   TRIG 188.0 (H) 02/01/2019 1409   HDL 59.40 02/01/2019 1409   CHOLHDL 4 02/01/2019 1409   VLDL 37.6 02/01/2019 1409   LDLCALC 141 (H) 02/01/2019 1409    Physical Exam:    VS:  BP 114/62 (BP Location: Right Arm, Patient Position: Sitting, Cuff Size: Normal)   Pulse (!) 53   Ht 4\' 10"  (1.473 m)   Wt 126 lb (57.2 kg)   SpO2 95%   BMI 26.33 kg/m     Wt Readings from Last 3 Encounters:  04/03/19 126 lb (57.2 kg)  02/26/19 124 lb (56.2 kg)  02/26/19 126 lb 6.4 oz (57.3 kg)     GEN: Well nourished, well developed in no acute distress HEENT: Normal NECK: No JVD; No carotid bruits LYMPHATICS: No lymphadenopathy CARDIAC: S1S2 noted,RRR, no murmurs, rubs, gallops RESPIRATORY:  Clear to auscultation without rales, wheezing or rhonchi  ABDOMEN: Soft, non-tender, non-distended, +bowel sounds, no guarding. EXTREMITIES: No edema, No cyanosis, no clubbing MUSCULOSKELETAL:  No edema; No deformity  SKIN: Warm and dry NEUROLOGIC:  Alert and oriented x 3, non-focal PSYCHIATRIC:  Normal affect, good insight  ASSESSMENT:    1. Palpitations   2. Essential hypertension   3. Chronic diastolic congestive heart failure ([place])    PLAN:    1. She reports palpitations which is bordersome. I will like to rule out any arrhythmia and assess for any heart rate excursion  at this time therefore I will place a ZIo patch monitor on the patient for 5 days.   2. She tells me that her blood pressure is up and down with her lowest systolic blood pressure at home has been 99 mmHG and her highest systolic blood pressure [id number] mmHg. With this big discrepancy, I have ask the patient to take her blood pressure daily and bring this information along with her blood pressure cuff to her office visit. Therefore for now there will be no changes in her antihypertensive as I would rather have her at a slightly higher target of 140/90 mmhg to avoid the patient getting dizzy or passing out.   3. She is euvolemic  - continue current dose of her lasix.  The patient is in agreement with the above plan. The patient left the office in stable condition.  The patient will follow up in 3 months or sooner if needed.   Medication Adjustments/Labs and Tests Ordered: Current medicines are reviewed at length with the patient today.  Concerns regarding medicines are outlined above.  Orders Placed This Encounter  Procedures  . Basic Metabolic Panel (BMET)  . Magnesium  . TSH  . LONG TERM MONITOR (3-14 DAYS)   No orders of the defined types were placed in this encounter.   Patient Instructions  Your physician recommends that you continue on your current medications as directed. Please refer to the Current Medication list given to you today.  *If you need a refill on your cardiac medications before your next appointment, please call your pharmacy*  Lab Work: Your physician recommends that you return for lab work in: TODAY BMP,Magnesium,TSH  If you have labs (blood work) drawn today and your tests  are completely normal, you will receive your results only by: Marland Kitchen MyChart Message (if you have MyChart) OR . A paper copy in the mail If you have any lab test that is abnormal or we need to change your treatment, we will call you to review the results.  Testing/Procedures: A zio monitor was  placed today. It will remain on for 5 days. You will then return monitor and event diary in provided box. It takes 1-2 weeks for report to be downloaded and returned to [name]. We will call you with the results. If monitor falls off or has orange flashing light, please call Zio for further instructions.     Follow-Up: At [facility], you and your health needs are our priority.  As part of our continuing mission to provide you with exceptional heart care, we have created designated Provider Care Teams.  These Care Teams include your primary Cardiologist (physician) and Advanced Practice Providers (APPs -  Physician Assistants and Nurse Practitioners) who all work together to provide you with the care you need, when you need it.  Your next appointment:   2 months  The format for your next appointment:   In Person  Provider:   Berniece Salines, DO  Other Instructions      Adopting a Healthy Lifestyle.  Know what a healthy weight is for you (roughly BMI <25) and aim to maintain this   Aim for 7+ servings of fruits and vegetables daily   65-80+ fluid ounces of water or unsweet tea for healthy kidneys   Limit to max 1 drink of alcohol per day; avoid smoking/tobacco   Limit animal fats in diet for cholesterol and heart health - choose grass fed whenever available   Avoid highly processed foods, and foods high in saturated/trans fats   Aim for low stress - take time to unwind and care for your mental health   Aim for 150 min of moderate intensity exercise weekly for heart health, and weights twice weekly for bone health   Aim for 7-9 hours of sleep daily   When it comes to diets, agreement about the perfect plan isnt easy to find, even among the experts. Experts at the [employer] developed an idea known as the Healthy Eating Plate. Just imagine a plate divided into logical, healthy portions.   The emphasis is on diet quality:   Load up on vegetables and fruits -  one-half of your plate: Aim for color and variety, and remember that potatoes dont count.   Go for whole grains - one-quarter of your plate: Whole wheat, barley, wheat berries, quinoa, oats, brown rice, and foods made with them. If you want pasta, go with whole wheat pasta.   Protein power - one-quarter of your plate: Fish, chicken, beans, and nuts are all healthy, versatile protein sources. Limit red meat.   The diet, however, does go beyond the plate, offering a few other suggestions.   Use healthy plant oils, such as olive, canola, soy, corn, sunflower and peanut. Check the labels, and avoid partially hydrogenated oil, which have unhealthy trans fats.   If youre thirsty, drink water. Coffee and tea are good in moderation, but skip sugary drinks and limit milk and dairy products to one or two daily servings.   The type of carbohydrate in the diet is more important than the amount. Some sources of carbohydrates, such as vegetables, fruits, whole grains, and beans-are healthier than others.   Finally, stay active  Signed,  Berniece Salines, DO  04/03/2019 10:16 PM    [facility]

## 2019-04-03 NOTE — Patient Instructions (Addendum)
Your physician recommends that you continue on your current medications as directed. Please refer to the Current Medication list given to you today.  *If you need a refill on your cardiac medications before your next appointment, please call your pharmacy*  Lab Work: Your physician recommends that you return for lab work in: TODAY BMP,Magnesium,TSH  If you have labs (blood work) drawn today and your tests are completely normal, you will receive your results only by: Marland Kitchen MyChart Message (if you have MyChart) OR . A paper copy in the mail If you have any lab test that is abnormal or we need to change your treatment, we will call you to review the results.  Testing/Procedures: A zio monitor was placed today. It will remain on for 5 days. You will then return monitor and event diary in provided box. It takes 1-2 weeks for report to be downloaded and returned to [name]. We will call you with the results. If monitor falls off or has orange flashing light, please call Zio for further instructions.     Follow-Up: At [facility], you and your health needs are our priority.  As part of our continuing mission to provide you with exceptional heart care, we have created designated Provider Care Teams.  These Care Teams include your primary Cardiologist (physician) and Advanced Practice Providers (APPs -  Physician Assistants and Nurse Practitioners) who all work together to provide you with the care you need, when you need it.  Your next appointment:   2 months  The format for your next appointment:   In Person  Provider:   Berniece Salines, DO  Other Instructions

## 2019-04-04 LAB — TSH: TSH: 1.41 u[IU]/mL (ref 0.450–4.500)

## 2019-04-04 LAB — BASIC METABOLIC PANEL
BUN/Creatinine Ratio: 25 (ref 12–28)
BUN: 27 mg/dL (ref 8–27)
CO2: 20 mmol/L (ref 20–29)
Calcium: 9.1 mg/dL (ref 8.7–10.3)
Chloride: 99 mmol/L (ref 96–106)
Creatinine, Ser: 1.08 mg/dL — ABNORMAL HIGH (ref 0.57–1.00)
GFR calc Af Amer: 57 mL/min/{1.73_m2} — ABNORMAL LOW (ref >59)
GFR calc non Af Amer: 50 mL/min/{1.73_m2} — ABNORMAL LOW (ref >59)
Glucose: 78 mg/dL (ref 65–99)
Potassium: 4.4 mmol/L (ref 3.5–5.2)
Sodium: 136 mmol/L (ref 134–144)

## 2019-04-04 LAB — MAGNESIUM: Magnesium: 1.8 mg/dL (ref 1.6–2.3)

## 2019-04-04 NOTE — Assessment & Plan Note (Addendum)
She has recovered slowly but well. She has just moved back home from her daughter's house today. She has graduated from a walker to a cane. She is encouraged to maintain adequate protein intake and hydration and stay as active as tolerated

## 2019-04-04 NOTE — Assessment & Plan Note (Signed)
Increase leafy greens, consider increased lean red meat and using cast iron cookware. Continue to monitor, report any concerns. Continue to monitor

## 2019-04-04 NOTE — Progress Notes (Signed)
Virtual Visit via phone Note  I connected with Julie Wilkerson on 04/03/19 at  3:40 PM EST by a phone enabled telemedicine application and verified that I am speaking with the correct person using two identifiers.  Location: Patient: home Provider: office   I discussed the limitations of evaluation and management by telemedicine and the availability of in person appointments. The patient expressed understanding and agreed to proceed. [name], CMA was able to set the patient set up on a phone visit after being unable to set up virtual visit.   Subjective:    Patient ID: Julie Wilkerson, female    DOB: 29-Oct-1940, 78 y.o.   MRN: [id number]  No chief complaint on file.   HPI Patient is in today for follow up on recent hospitalizations, baby cramps in abdomen and more. She is feeling notably better today. No recent febile illness. No fevers or chills. Denies CP/palp/SOB/HA/congestion/fevers/GI or GU c/o. Taking meds as prescribed,  Past Medical History:  Diagnosis Date  . Anemia   . Anginal pain ([place])   . Anxiety   . Anxiety and depression 03/18/2019  . Arthritis   . Asthma   . Breast cancer ([place])    2002, left, encapsulated, microcalcifications. lumpectomy, radtiation x 30  . Breast cancer in female [facility])   . Change in mole 06/08/2016  . Colitis   . Complication of anesthesia    VERY SENSITIVE TO ANESTHESIA   . Decreased visual acuity 01/16/2017  . Depression   . Dyspnea 11/04/2016   with asthma exacerbation only  . Dysuria 11/04/2016  . Family history of adverse reaction to anesthesia   . Gluten intolerance   . History of chicken pox   . History of Helicobacter pylori infection 08/01/2015  . Hyperlipidemia   . Hypertension   . Hypothyroid   . IBS (irritable bowel syndrome)   . Left leg pain 03/01/2017  . Low back pain 08/10/2015  . Neck pain 03/01/2017  . Osteoporosis   . Personal history of radiation therapy   . Pneumonia   . PONV (postoperative nausea and  vomiting)   . RLQ discomfort 03/01/2017  . Stomach cramps   . Urinary tract infection 11/04/2016    Past Surgical History:  Procedure Laterality Date  . ABDOMINAL HYSTERECTOMY     partial  . APPENDECTOMY    . BREAST LUMPECTOMY Left 2002  . CHOLECYSTECTOMY  Age 78 or 50  . COLONOSCOPY  2017  . EYE SURGERY Left    cataract  . LEFT HEART CATH AND CORONARY ANGIOGRAPHY N/A 02/09/2019   Procedure: LEFT HEART CATH AND CORONARY ANGIOGRAPHY;  Surgeon: Nelva Bush, MD;  Location: [facility];  Service: Cardiovascular;  Laterality: N/A;  . TONSILLECTOMY    . 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 Colitis Mother   . Irritable bowel syndrome Mother   . Hypertension Father   . Hyperlipidemia Brother   . Heart attack Brother   . Stomach cancer Paternal Grandmother 53  . Hyperlipidemia Brother   . Heart attack Brother   . Hyperlipidemia Brother   . Heart attack Brother   . Hyperlipidemia Sister   . Leukemia Daughter   . Arthritis Daughter   . Heart disease Daughter        ASD  vs VSD  . Colon cancer Neg Hx     Social History   Socioeconomic History  . Marital status: Single    Spouse name: Not on file  . Number of children: 3  . Years of education: Not on file  . Highest education level: Master's degree (e.g., MA, MS, MEng, MEd, MSW, MBA)  Occupational History  . Occupation: retired    Comment: Pharmacist, hospital, kindergarten  Social Needs  . Financial resource strain: Not on file  . Food insecurity    Worry: Not on file    Inability: Not on file  . Transportation needs    Medical: Not on file    Non-medical: Not on file  Tobacco Use  . Smoking status: Never Smoker  . Smokeless tobacco: Never Used  Substance and Sexual Activity  . Alcohol use: Never    Alcohol/week:  0.0 standard drinks    Frequency: Never  . Drug use: Never  . Sexual activity: Not Currently    Birth control/protection: None    Comment: lives alone, avoids daiiry and gluten. 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 Physical activity    Days per week: Not on file    Minutes per session: Not on file  . Stress: Not on file  Relationships  . Social Herbalist on phone: Not on file    Gets together: Not on file    Attends religious service: Not on file    Active member of club or organization: Not on file    Attends meetings of clubs or organizations: Not on file    Relationship status: Not on file  . Intimate partner violence    Fear of current or ex partner: Not on file    Emotionally abused: Not on file    Physically abused: Not on file    Forced sexual activity: Not on file  Other Topics Concern  . Not on file  Social History Narrative  . Not on file    Outpatient Medications Prior to Visit  Medication Sig Dispense Refill  . albuterol (VENTOLIN HFA) 108 (90 Base) MCG/ACT inhaler Inhale 2 puffs into the lungs every 6 (six) hours as needed for wheezing or shortness of breath. 18 g 2  . atorvastatin (LIPITOR) 20 MG tablet Take 1 tablet (20 mg total) by mouth daily. 90 tablet 3  . citalopram (CELEXA) 20 MG tablet Take 1 tablet (20 mg total) by mouth at bedtime. 90 tablet 1  . docusate sodium (COLACE) 100 MG capsule Take 1 capsule (100 mg total) by mouth 2 (two) times daily. 28 capsule 0  . ferrous sulfate (FERROUSUL) 325 (65 FE) MG tablet Take 1 tablet (325 mg total) by mouth 3 (three) times daily with meals for 14 days. 42 tablet 0  . fluticasone (FLOVENT HFA) 110 MCG/ACT inhaler Inhale 1 puff into the lungs 2 (two) times daily. (Patient taking differently: Inhale 1 puff into the lungs 2 (two) times daily as needed (shortness of breath or wheezing). ) 1 Inhaler 12  . furosemide (LASIX) 40 MG tablet Take 1 tablet (40 mg total) by mouth daily. 90 tablet 1  . levothyroxine  (SYNTHROID, LEVOTHROID) 50 MCG tablet Take 1 tablet (50 mcg total) by mouth daily. 90 tablet 1  . lisinopril (ZESTRIL) 5 MG tablet Take 1 tablet (5 mg total) by mouth 2 (two) times daily. 60 tablet 2  . metoprolol succinate (TOPROL-XL) 25 MG 24 hr tablet Take 1 tablet (25 mg total) by mouth daily. (  Patient taking differently: Take 25 mg by mouth at bedtime. ) 30 tablet 11  . omega-3 acid ethyl esters (LOVAZA) 1 g capsule Take 2 g by mouth daily.    Marland Kitchen omeprazole (PRILOSEC) 40 MG capsule Take 1 capsule (40 mg total) by mouth daily. 30 capsule 4  . ondansetron (ZOFRAN) 4 MG tablet Take 1 tablet (4 mg total) by mouth every 8 (eight) hours as needed for nausea or vomiting. 30 tablet 1  . polyethylene glycol (MIRALAX / GLYCOLAX) 17 g packet Take 17 g by mouth 2 (two) times daily. (Patient taking differently: Take 17 g by mouth daily. ) 28 packet 0  . potassium chloride (K-DUR) 10 MEQ tablet Take 1 tablet (10 mEq total) by mouth daily. 30 tablet 1  . primidone (MYSOLINE) 50 MG tablet Take 1 tablet (50 mg total) by mouth at bedtime. 90 tablet 1  . senna-docusate (SENOKOT-S) 8.6-50 MG tablet Take 2 tablets by mouth 2 (two) times daily. (Patient taking differently: Take 2 tablets by mouth as needed. )     No facility-administered medications prior to visit.     No Known Allergies  Review of Systems  Constitutional: Positive for malaise/fatigue. Negative for fever.  HENT: Negative for congestion.   Eyes: Negative for blurred vision.  Respiratory: Negative for shortness of breath.   Cardiovascular: Negative for chest pain, palpitations and leg swelling.  Gastrointestinal: Negative for abdominal pain, blood in stool and nausea.  Genitourinary: Negative for dysuria and frequency.  Musculoskeletal: Negative for falls.  Skin: Negative for rash.  Neurological: Positive for weakness. Negative for dizziness, loss of consciousness and headaches.  Endo/Heme/Allergies: Negative for environmental allergies.   Psychiatric/Behavioral: Negative for depression. The patient is not nervous/anxious.        Objective:    Physical Exam Constitutional:      Appearance: Normal appearance. She is not ill-appearing.  HENT:     Head: Normocephalic and atraumatic.  Eyes:     Pupils: Pupils are equal, round, and reactive to light.  Cardiovascular:     Pulses: Normal pulses.  Abdominal:     Palpations: Abdomen is soft.  Neurological:     Mental Status: She is alert and oriented to person, place, and time.  Psychiatric:        Mood and Affect: Mood normal.        Behavior: Behavior normal.     There were no vitals taken for this visit. Wt Readings from Last 3 Encounters:  04/03/19 126 lb (57.2 kg)  02/26/19 124 lb (56.2 kg)  02/26/19 126 lb 6.4 oz (57.3 kg)    Diabetic Foot Exam - Simple   No data filed     Lab Results  Component Value Date   WBC 6.6 02/26/2019   HGB 10.9 (L) 02/26/2019   HCT 32.7 (L) 02/26/2019   PLT 428.0 (H) 02/26/2019   GLUCOSE 78 04/03/2019   CHOL 238 (H) 02/01/2019   TRIG 188.0 (H) 02/01/2019   HDL 59.40 02/01/2019   LDLCALC 141 (H) 02/01/2019   ALT 17 03/20/2019   AST 22 03/20/2019   NA 136 04/03/2019   K 4.4 04/03/2019   CL 99 04/03/2019   CREATININE 1.08 (H) 04/03/2019   BUN 27 04/03/2019   CO2 20 04/03/2019   TSH 1.410 04/03/2019   INR 1.2 02/16/2019   HGBA1C 6.2 02/01/2019    Lab Results  Component Value Date   TSH 1.410 04/03/2019   Lab Results  Component Value Date   WBC  6.6 02/26/2019   HGB 10.9 (L) 02/26/2019   HCT 32.7 (L) 02/26/2019   MCV 87.0 02/26/2019   PLT 428.0 (H) 02/26/2019   Lab Results  Component Value Date   NA 136 04/03/2019   K 4.4 04/03/2019   CO2 20 04/03/2019   GLUCOSE 78 04/03/2019   BUN 27 04/03/2019   CREATININE 1.08 (H) 04/03/2019   BILITOT 0.5 03/20/2019   ALKPHOS 93 03/20/2019   AST 22 03/20/2019   ALT 17 03/20/2019   PROT 7.0 03/20/2019   ALBUMIN 4.3 03/20/2019   CALCIUM 9.1 04/03/2019   ANIONGAP  11 02/24/2019   GFR 48.63 (L) 03/20/2019   Lab Results  Component Value Date   CHOL 238 (H) 02/01/2019   Lab Results  Component Value Date   HDL 59.40 02/01/2019   Lab Results  Component Value Date   LDLCALC 141 (H) 02/01/2019   Lab Results  Component Value Date   TRIG 188.0 (H) 02/01/2019   Lab Results  Component Value Date   CHOLHDL 4 02/01/2019   Lab Results  Component Value Date   HGBA1C 6.2 02/01/2019       Assessment & Plan:   Problem List Items Addressed This Visit    Vitamin D deficiency    Supplement and monitor      Hyperglycemia    hgba1c acceptable, minimize simple carbs. Increase exercise as tolerated. She describes some previous episodes of feeling hot and sweaty with palpitations and anxiety in the past. As a result her cardiologist is going to run an event monitor. She notes this did not happen while she was living at her daughter's house but instead occurred when she lived alone and was not taking care of eating protein in the evenings.       Sepsis due to pneumonia [facility])    She has recovered slowly but well. She has just moved back home from her daughter's house today. She has graduated from a walker to a cane. She is encouraged to maintain adequate protein intake and hydration and stay as active as tolerated      Acute blood loss anemia    Increase leafy greens, consider increased lean red meat and using cast iron cookware. Continue to monitor, report any concerns. Continue to monitor         I am having Julie Wilkerson. Julie Wilkerson maintain her metoprolol succinate, levothyroxine, primidone, fluticasone, omega-3 acid ethyl esters, atorvastatin, ferrous sulfate, docusate sodium, polyethylene glycol, senna-docusate, potassium chloride, ondansetron, albuterol, lisinopril, furosemide, citalopram, and omeprazole.     I discussed the assessment and treatment plan with the patient. The patient was provided an opportunity to ask questions and all were answered.  The patient agreed with the plan and demonstrated an understanding of the instructions.   The patient was advised to call back or seek an in-person evaluation if the symptoms worsen or if the condition fails to improve as anticipated.  I provided 25 minutes of non-face-to-face time during this encounter.   Penni Homans, MD

## 2019-04-04 NOTE — Assessment & Plan Note (Signed)
Supplement and monitor 

## 2019-04-04 NOTE — Assessment & Plan Note (Signed)
hgba1c acceptable, minimize simple carbs. Increase exercise as tolerated. She describes some previous episodes of feeling hot and sweaty with palpitations and anxiety in the past. As a result her cardiologist is going to run an event monitor. She notes this did not happen while she was living at her daughter's house but instead occurred when she lived alone and was not taking care of eating protein in the evenings.

## 2019-04-05 ENCOUNTER — Ambulatory Visit: Payer: Medicare HMO | Admitting: Cardiology

## 2019-04-06 ENCOUNTER — Ambulatory Visit (HOSPITAL_BASED_OUTPATIENT_CLINIC_OR_DEPARTMENT_OTHER): Payer: Medicare HMO

## 2019-04-06 ENCOUNTER — Telehealth: Payer: Self-pay | Admitting: Family Medicine

## 2019-04-06 ENCOUNTER — Telehealth: Payer: Self-pay | Admitting: *Deleted

## 2019-04-06 NOTE — Telephone Encounter (Signed)
Telephone call to patient. Left message that labs were stable and to call with any questions. 

## 2019-04-06 NOTE — Telephone Encounter (Signed)
Copied from [place] [id number]. Topic: General - Other >> Apr 06, 2019  3:22 PM Keene Breath wrote: Reason for CRM: Patient called to request a referral for a mammogram.  Please advise and call patient to discuss at [phone]

## 2019-04-06 NOTE — Telephone Encounter (Signed)
-----   Message from Berniece Salines, DO sent at 04/04/2019  2:37 PM EST ----- Please let the patient know that her renal function is stable.

## 2019-04-10 ENCOUNTER — Other Ambulatory Visit: Payer: Self-pay | Admitting: Family Medicine

## 2019-04-10 DIAGNOSIS — N6320 Unspecified lump in the left breast, unspecified quadrant: Secondary | ICD-10-CM

## 2019-04-10 NOTE — Telephone Encounter (Signed)
Ordered for [place] [facility]

## 2019-04-10 NOTE — Telephone Encounter (Signed)
Patient states she feels something on her left side of her breast and wants to have a mammogram, she states she needs to have a 3-D  Breast exam  Please advise

## 2019-04-10 NOTE — Telephone Encounter (Signed)
Patient notified

## 2019-04-12 ENCOUNTER — Ambulatory Visit (INDEPENDENT_AMBULATORY_CARE_PROVIDER_SITE_OTHER): Payer: Medicare HMO | Admitting: Psychology

## 2019-04-12 ENCOUNTER — Encounter: Payer: Self-pay | Admitting: Psychology

## 2019-04-12 ENCOUNTER — Other Ambulatory Visit: Payer: Self-pay

## 2019-04-12 ENCOUNTER — Ambulatory Visit: Payer: Medicare HMO

## 2019-04-12 DIAGNOSIS — F0150 Vascular dementia without behavioral disturbance: Secondary | ICD-10-CM | POA: Diagnosis not present

## 2019-04-12 DIAGNOSIS — R413 Other amnesia: Secondary | ICD-10-CM

## 2019-04-12 DIAGNOSIS — F01A0 Vascular dementia, mild, without behavioral disturbance, psychotic disturbance, mood disturbance, and anxiety: Secondary | ICD-10-CM

## 2019-04-12 DIAGNOSIS — R69 Illness, unspecified: Secondary | ICD-10-CM | POA: Diagnosis not present

## 2019-04-12 NOTE — Progress Notes (Signed)
   Neuropsychology Note   Julie Wilkerson completed 110 minutes of neuropsychological testing with technician, Cruzita Lederer, B.S., under the supervision of Dr. Christia Reading, Ph.D., licensed neuropsychologist. The patient did not appear overtly distressed by the testing session, per behavioral observation or via self-report to the technician. Rest breaks were offered.    In considering the patient's current level of functioning, level of presumed impairment, nature of symptoms, emotional and behavioral responses during the interview, level of literacy, and observed level of motivation/effort, a battery of tests was selected and communicated to the psychometrician.   Communication between the psychologist and technician was ongoing throughout the testing session and changes were made as deemed necessary based on patient performance on testing, technician observations and additional pertinent factors such as those listed above.   Julie Wilkerson will return within approximately two weeks for an interactive feedback session with Dr. Melvyn Novas at which time his test performances, clinical impressions, and treatment recommendations will be reviewed in detail. The patient understands she can contact our office should she require our assistance before this time.   Full report to follow.  110 minutes were spent face-to-face with patient administering standardized tests and 15 minutes were spent scoring (technician). [CPT [id number], [id number]

## 2019-04-12 NOTE — Progress Notes (Signed)
NEUROPSYCHOLOGICAL EVALUATION Julie Wilkerson. [place] [facility] Neurology  Reason for Referral:   Julie Wilkerson is a 78 y.o. female referred by Julie Wilkerson, D.O., to characterize her current cognitive functioning and assist with diagnostic clarity and treatment planning in the context of subjective cognitive decline and history of bilateral upper extremity tremor.  Assessment and Plan:   Clinical Impression(s): Julie Wilkerson pattern of performance is suggestive of moderate frontal-subcortical dysfunction, as evidenced by prominent weaknesses across processing speed, attention/concentration, executive functioning, and expressive language. This pattern of performance is consistent with her history of various cardiovascular ailments and neuroimaging suggesting small vessel ischemic changes. 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 It should also be noted that expressive language deficits could be partially attributed to Julie Wilkerson being tested in [place] rather than her native language ([place]). Performance was within normal limits across domains of receptive language, visuospatial functioning, and learning and memory. Responses across mood-related questionnaires did not suggest ongoing psychiatric distress.  Specific to memory, Julie Wilkerson was able to learn novel verbal and visual information efficiently and retain this knowledge after lengthy delays. Overall, memory performance combined with intact performances across other areas of cognitive functioning is not suggestive of an underlying neurodegenerative condition affecting memory processes (e.g., Alzheimer's disease).  Recommendations: A repeat neuropsychological evaluation in 12-18 months (or sooner if functional decline is noted) is recommended to assess the  trajectory of future cognitive decline should it occur. This will also aid in future efforts towards improved diagnostic clarity.  Julie Wilkerson is encouraged to attend to lifestyle factors for brain health (e.g., regular physical exercise, good nutrition habits, regular participation in cognitively-stimulating activities, and general stress management techniques), which are likely to have benefits for both emotional adjustment and cognition. Optimal control of vascular risk factors (including safe cardiovascular exercise and adherence to dietary recommendations) is encouraged.  To address problems with processing speed, she may wish to consider:   -Ensuring that she is alerted when essential material or instructions are being presented   -Adjusting the speed at which new information is presented   -Allowing additional processing time or a chance to rehearse novel information   -Allowing for more time in comprehending, processing, and responding in conversation   -Repeating and paraphrasing instructions or conversations aloud  To address problems with fluctuating attention, she may wish to consider:   -Avoiding external distractions when needing to concentrate   -Writing down complicated information and using checklists   -Attempting and completing one task at a time (i.e., no multi-tasking)   -Reducing the amount of information considered at one time  Review of Records:   Julie Wilkerson was seen by [facility] Julie Wilkerson, D.O.) on 01/16/2019 for an evaluation of tremor. Upper extremity tremulous behavior (right worse than left) was said to start approximately 5 years prior. She was started on primidone 25 mg daily to address these symptoms, which was described as effective. Julie Wilkerson also alluded to cognitive concerns surrounding memory. She reported instances where she will enter a room and forget her original intention and noted that she felt it was likely more of a concentration issue.  Performance on a brief cognitive screening instrument (MOCA) was 24/30. Points were lost across the following domains: visuospatial/executive (4/5), digit repetition (1/2), sentence repetition (1/2), verbal fluency (0/1), and delayed recall (4/5). Ultimately, Julie Wilkerson was  referred for a comprehensive neuropsychological evaluation to characterize her cognitive abilities and to assist with diagnostic clarity and treatment planning.   On 02/06/2019, Julie Wilkerson underwent echocardiogram that demonstrated an EF of [id number] and diastolic dysfunction. 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 On 02/13/2019, Julie Wilkerson was admitted for an elected hip arthroplasty. She tolerated the procedure well and was discharged home on 02/14/2019. She was then admitted on 02/15/2019 for shortness of breath. On that occasion, she was hypotensive in the ED and developed a cough. She was found to have acute hypoxic respiratory failure due to acute diastolic CHF. She was treated with IV diuresis. SaO2 on admission was 88%. BNP was elevated over 500 with crackles and evidence of pulmonary edema. She was discharged on 02/19/2019. On 02/22/2019, she presented with complaints of shortness of breath. CXR demonstrated mild diffuse pulmonary interstitial edema suggesting CHF and cardiomegaly; she was discharged home on 02/24/2019.  Brain MRI on 03/21/2016 revealed atrophy and small vessel disease. Head CT on 03/29/2017 revealed atrophy with small vessel chronic ischemic changes of deep cerebral white matter. Head CT on 02/15/2019 revealed age-related atrophy and chronic microvascular ischemic changes. Head CT on 02/22/2019 revealed mild age-related atrophy and chronic microvascular ischemic changes.  Past Medical History:  Diagnosis Date   Anemia    Anginal pain (Julie Wilkerson)    Arthritis    Asthma    Breast cancer (Julie Wilkerson)    2002, left,  encapsulated, microcalcifications. lumpectomy, radtiation x 30   [facility]ast [facility] in [facility])    Change in mole 06/08/2016   Colitis    Complication of anesthesia    VERY SENSITIVE TO ANESTHESIA    Decreased visual acuity 01/16/2017   Dyspnea 11/04/2016   with asthma exacerbation only   Dysuria 11/04/2016   Gluten intolerance    History of chicken pox    History of Helicobacter pylori infection 08/01/2015   Hyperlipidemia    Hypertension    Hypothyroidism    IBS (irritable bowel syndrome)    Left leg pain 03/01/2017   Low back pain 08/10/2015   Neck pain 03/01/2017   Osteoporosis    Personal history of radiation therapy    Pneumonia    PONV (postoperative nausea and vomiting)    RLQ discomfort 03/01/2017   Stomach cramps    Urinary tract infection 11/04/2016    Past Surgical History:  Procedure Laterality Date   ABDOMINAL HYSTERECTOMY     partial   APPENDECTOMY     BREAST LUMPECTOMY Left 2002   CHOLECYSTECTOMY  Age 69 or 47   COLONOSCOPY  2017   EYE SURGERY Left    cataract   LEFT HEART CATH AND CORONARY ANGIOGRAPHY N/A 02/09/2019   Procedure: LEFT HEART CATH AND CORONARY ANGIOGRAPHY;  Surgeon: Nelva Bush, MD;  Location: [facility];  Service: Cardiovascular;  Laterality: N/A;   TONSILLECTOMY     TOTAL HIP ARTHROPLASTY Left 02/13/2019   Procedure: TOTAL HIP ARTHROPLASTY ANTERIOR APPROACH;  Surgeon: Paralee Cancel, MD;  Location: [facility];  Service: Orthopedics;  Laterality: Left;  70 mins    Family History  Problem Relation Age of Onset   Colitis Mother    Irritable bowel syndrome Mother    Hypertension Father    Hyperlipidemia Brother    Heart attack Brother    Stomach cancer Paternal Grandmother 37   Hyperlipidemia Brother    Heart attack Brother  Hyperlipidemia Brother    Heart attack Brother    Hyperlipidemia Sister    Leukemia Daughter    Arthritis Daughter    Heart disease Daughter        ASD vs VSD    Colon cancer Neg Hx      Current Julie Medications:    albuterol (VENTOLIN HFA) 108 (90 Base) MCG/ACT inhaler, Inhale 2 puffs into the lungs every 6 (six) hours as needed for wheezing or shortness of breath., Disp: 18 g, Rfl: 2   ASPIRIN 81 PO, 81 mg 2 (two) times daily., Disp: , Rfl:    atorvastatin (LIPITOR) 20 MG tablet, Take 1 tablet (20 mg total) by mouth daily., Disp: 90 tablet, Rfl: 3   citalopram (CELEXA) 20 MG tablet, Take 1 tablet (20 mg total) by mouth at bedtime., Disp: 90 tablet, Rfl: 1   docusate sodium (COLACE) 100 MG capsule, Take 1 capsule (100 mg total) by mouth 2 (two) times daily., Disp: 28 capsule, Rfl: 0   ferrous sulfate (FERROUSUL) 325 (65 FE) MG tablet, Take 1 tablet (325 mg total) by mouth 3 (three) times daily with meals for 14 days., Disp: 42 tablet, Rfl: 0   fluticasone (FLOVENT HFA) 110 MCG/ACT inhaler, Inhale 1 puff into the lungs 2 (two) times daily. (Patient taking differently: Inhale 1 puff into the lungs 2 (two) times daily as needed (shortness of breath or wheezing). ), Disp: 1 Inhaler, Rfl: 12   furosemide (LASIX) 40 MG tablet, Take 1 tablet (40 mg total) by mouth daily., Disp: 90 tablet, Rfl: 1   levothyroxine (SYNTHROID, LEVOTHROID) 50 MCG tablet, Take 1 tablet (50 mcg total) by mouth daily., Disp: 90 tablet, Rfl: 1   lisinopril (ZESTRIL) 5 MG tablet, Take 1 tablet (5 mg total) by mouth 2 (two) times daily., Disp: 60 tablet, Rfl: 2   metoprolol succinate (TOPROL-XL) 25 MG 24 hr tablet, Take 1 tablet (25 mg total) by mouth daily. (Patient taking differently: Take 25 mg by mouth at bedtime. ), Disp: 30 tablet, Rfl: 11   omega-3 acid ethyl esters (LOVAZA) 1 g capsule, Take 2 g by mouth daily., Disp: , Rfl:    omeprazole (PRILOSEC) 40 MG capsule, Take 1 capsule (40 mg total) by mouth daily., Disp: 30 capsule, Rfl: 4   ondansetron (ZOFRAN) 4 MG tablet, Take 1 tablet (4 mg total) by mouth every 8 (eight) hours as needed for nausea or vomiting.,  Disp: 30 tablet, Rfl: 1   polyethylene glycol (MIRALAX / GLYCOLAX) 17 g packet, Take 17 g by mouth 2 (two) times daily. (Patient taking differently: Take 17 g by mouth daily. ), Disp: 28 packet, Rfl: 0   potassium chloride (K-DUR) 10 MEQ tablet, Take 1 tablet (10 mEq total) by mouth daily., Disp: 30 tablet, Rfl: 1   primidone (MYSOLINE) 50 MG tablet, Take 1 tablet (50 mg total) by mouth at bedtime., Disp: 90 tablet, Rfl: 1   senna-docusate (SENOKOT-S) 8.6-50 MG tablet, Take 2 tablets by mouth 2 (two) times daily. (Patient taking differently: Take 2 tablets by mouth as needed. ), Disp:  , Rfl:   Clinical Interview:   Cognitive Symptoms: Decreased short-term memory: Endorsed. Provided examples included entering a room and forgetting her original intention, as well as trouble remembering names of familiar individuals.  Decreased long-term memory: Denied. Decreased attention/concentration: Endorsed. Julie Wilkerson noted that her ability to stay focused on certain tasks "needs improvement." She also alluded to difficulties with distractibility and losing her train of thought.  Reduced processing  speed: Denied. Difficulties with executive functions: Denied. Difficulties with emotion regulation: Denied. Difficulties with receptive language: Largely denied. Difficulties were said to emerge if the person speaking to her is speaking too quickly or has an accent which is difficult to understand.  Difficulties with word finding: Denied. Decreased visuoperceptual ability: Denied.  Trajectory of deficits: Cognitive deficits surrounding attention/concentration and memory was said to be noticeable for the past year or so and have exhibited a very slow, gradual decline.   Difficulties completing ADLs: Denied.  Additional Medical History: History of traumatic brain injury/concussion: Denied. History of stroke: Denied. History of seizure activity: Denied. History of known exposure to toxins: Denied. Symptoms  of chronic pain: Denied. Experience of frequent headaches/migraines: "Sometimes." Medical records also suggest a history of left-sided migraine headaches dating back to childhood.  Frequent instances of dizziness/vertigo: Denied.  Sensory changes: Denied. Balance/coordination difficulties: Denied. Other motor difficulties: Endorsed. Julie Wilkerson reported a history of bilateral upper extremity tremors (right worse than left) which have been ongoing for the past 5 years. Worsening tremors were associated with acute anxiety symptoms. These symptoms were reported to have improved since starting medications.  Other medical conditions: Julie Wilkerson reported a history of breast cancer, diagnosed in 2002. She underwent surgical procedures, as well as approximately 30 radiation treatments. Persisting cognitive difficulties stemming from this treatment were denied.  Sleep History: Estimated hours obtained each night: 8 hours. Difficulties falling asleep: Denied. Difficulties staying asleep: Endorsed. She reported waking up approximately 3 times each night in order to use the restroom. However, she denied difficulties falling back asleep.  Feels rested and refreshed upon awakening: Endorsed.  History of snoring: Unknown as she lives alone. History of waking up gasping for air: Denied. Witnessed breath cessation while asleep: Unknown.  History of vivid dreaming: Denied. Excessive movement while asleep: Unknown as she lives alone. Instances of acting out her dreams: Unknown.  Psychiatric/Behavioral Health History: Depression: Denied. Outside of a period of bereavement following her mother's passing many years prior, Julie Wilkerson denied previous mental health concerns. She likewise denied prior diagnoses. Current or remote suicidal ideation, intent, or plan was also denied. Anxiety: Largely denied. She did acknowledge some anxiety symptoms and a previous "nervous breakdown," but these appear situation-dependent  and are not consistently present.  Mania: Denied. Trauma History: Denied. Visual/auditory hallucinations: Denied. Delusional thoughts: Denied. Mental health treatment: Endorsed. She reported a remote history of involvement in psychotherapy following the passing of her mother.  Tobacco: Denied. Alcohol: Julie Wilkerson denied current alcohol consumption, as well as a history of problematic alcohol use, abuse, or dependence.  Recreational drugs: Denied. Caffeine: 1 cup of coffee per week.  Academic/Vocational History: Highest level of educational attainment: 18 years. Julie Wilkerson grew up and completed early educational settings in [place]. She moved to the [place] approximately 50 years prior, earning a Water quality scientist degree in education and a Master's degree in Agricultural consultant. She described herself as an average (B) student in academic settings. Her primary language is [place], but speaks [place] fluently.  History of developmental delay: Denied. History of grade repetition: Endorsed. She reported repeating an unspecified grade when younger due to an extended illness.  Enrollment in special education courses: Denied. History of diagnosed specific learning disability: Denied. History of ADHD: Denied.  Employment: Retired. She previously worked as a Facilities manager.  Evaluation Results:   Behavioral Observations: Ms. Bettinger was unaccompanied, arrived to her appointment on time, and was appropriately dressed and groomed. Observed gait and station was  somewhat slowed, but broadly within normal limits. Mild resting tremors were observed in both hands (right somewhat greater than left). These were also very evident when completing motor-based cognitive tasks. Her affect was generally relaxed and positive, but did range appropriately given the subject being discussed during the clinical interview or the task at hand during testing procedures. Spontaneous speech was fluent and word finding difficulties  were not observed during the clinical interview or testing procedures. Sustained attention was appropriate throughout. Thought processes were coherent, organized, and normal in content. Task engagement was adequate and she persisted when challenged. She reported increasing headache and fatigue symptoms as the evaluation progressed; it was shortened in response. Overall, Ms. Batchelor was cooperative with the clinical interview and subsequent testing procedures.   Adequacy of Effort: The validity of neuropsychological testing is limited by the extent to which the individual being tested may be assumed to have exerted adequate effort during testing. Ms. Eck expressed her intention to perform to the best of her abilities and exhibited adequate task engagement and persistence. Scores across stand-alone and embedded performance validity measures were within expectation. As such, the results of the current evaluation are believed to be a valid representation of Ms. Keepers's current cognitive functioning.  Test Results: Ms. Bertelsen was fully oriented at the time of the current evaluation.  Intellectual abilities based upon educational and vocational attainment were estimated to be in the average range. Premorbid abilities were estimated to be within the average range based upon a single-word reading test.   Processing speed was variable, ranging from the exceptionally low to average normative ranges, but was generally impaired and represented a weakness across the current profile. Basic attention was well below average. More complex attention (e.g., working memory) was also well below average. Executive functioning was mildly variable, but generally impaired, also representing a weakness across the current evaluation.  Assessed receptive language abilities were within normal limits. Likewise, Ms. Roti did not exhibit any difficulties comprehending task instructions and answered all questions asked of her  appropriately. Assessed expressive language (e.g., verbal fluency and confrontation naming) was exceptionally low to below average.     Assessed visuospatial/visuoconstructional abilities were within normal limits.    Learning (i.e., encoding) of novel verbal and visual information was within normal limits. Spontaneous delayed recall (i.e., retrieval) of previously learned information was commensurate with performance across initial learning trials. Retention rates were strong across memory measures. Performance across recognition tasks was likewise strong, suggesting evidence for information consolidation.   Results of emotional screening instruments suggested that recent symptoms of generalized anxiety were in the minimal range, while symptoms of depression were within normal limits. A screening instrument assessing recent sleep quality suggested the presence of minimal sleep dysfunction.  Tables of Scores:   Note: This summary of test scores accompanies the interpretive report and should not be considered in isolation without reference to the appropriate sections in the text. Descriptors are based on appropriate normative data and may be adjusted based on clinical judgment. The terms impaired and within normal limits (WNL) are used when a more specific level of functioning cannot be determined.       Effort Testing:   DESCRIPTOR       ACS Word Choice: --- --- Within Expectation    *Based on 78 y/o norms     Dot Counting Test: --- --- Within Expectation  HVLT-R Recognition Discrimination Index: --- --- Within Expectation  BVMT-R Retention Percentage: --- --- Within Expectation  Orientation:      Raw Score Percentile   NAB Orientation, Form 1 29/29 --- ---       Intellectual Functioning:           Standard Score Percentile   Test of Premorbid Functioning: 92 30 Average       Memory:          Wechsler Memory Scale (WMS-IV):                       Raw Score (Scaled Score)  Percentile     Logical Memory I 25/53 (8) 25 Average    Logical Memory II 18/39 (11) 63 Average    Logical Memory Recognition 18/23 26-50 Average       Hopkins Verbal Learning Test (HVLT-R), Form 1: Raw Score (T Score) Percentile     Total Trials 1-3 21/36 (46) 34 Average    Delayed Recall 10/12 (56) 73 Average    Recognition Discrimination Index 11 (54) 66 Average      True Positives 11 --- ---      False Positives 0 --- ---       Brief Visuospatial Memory Test (BVMT-R), Form 1: Raw Score (T Score) Percentile     Total Trials 1-3 19/36 (48) 42 Average    Delayed Recall 9/12 (55) 69 Average    Recognition Discrimination Index 6 >16 Within Normal Limits      Recognition Hits 6/6 >16 Within Normal Limits      False Positive Errors 0 >16 Within Normal Limits        Attention/Executive Function:          Trail Making Test (TMT): Raw Score (T Score) Percentile     Part A 88 secs.,  1 error (21) <1 Exceptionally Low    Part B DC'D @ 300,  3 errors --- Impaired       Symbol Digit Modalities Test (SDMT): Raw Score (Z-Score) Percentile     Oral 25 (-2.05) 2 Exceptionally Low       NAB Attention Module, Form 1: T Score Percentile     Digits Forward 33 5 Well Below Average    Digits Backwards 34 5 Well Below Average       D-KEFS Color-Word Interference Test: Raw Score (Scaled Score) Percentile     Color Naming 46 secs. (5) 5 Well Below Average    Word Reading 29 secs. (8) 25 Average    Inhibition 100 secs. (6) 9 Below Average      Total Errors 2 errors (11) 63 Average    Inhibition/Switching 130 secs. (3) 1 Exceptionally Low      Total Errors 7 errors (6) 9 Below Average       D-KEFS 20 Questions Test: Scaled Score Percentile     Total Weighted Achievement Score 1 <1 Exceptionally Low    Initial Abstraction Score 5 5 Well Below Average       Language:          Verbal Fluency Test: Raw Score (Z-Score) Percentile     Phonemic Fluency (FAS) 14 (-2.31) 2 Well Below Average     Animal Fluency 12 (-1.48) 7 Below Average  *Based on [employer] Older Normative Studies (MOANS)          NAB Language Module, Form 2: T Score Percentile     Auditory Comprehension 54 66 Average    Naming 24/31 (20) <1 Exceptionally Low       Visuospatial/Visuoconstruction:  Raw Score Percentile   Clock Drawing: 8/10 --- Within Normal Limits       NAB Spatial Module, Form 2: T Score Percentile     Visual Discrimination 47 38 Average        Scaled Score Percentile   WAIS-IV Matrix Reasoning: 9 37 Average  WAIS-IV Visual Puzzles: 8 25 Average       Mood and Personality:      Raw Score Percentile   Geriatric Depression Scale: 2 --- Within Normal Limits  Geriatric Anxiety Scale: 8 --- Minimal    Somatic 5 --- Minimal    Cognitive 1 --- Minimal    Affective 2 --- Minimal       Additional Questionnaires:      Raw Score Percentile   PROMIS Sleep Disturbance Questionnaire: 13 --- None to Slight   Informed Consent and Coding/Compliance:   Ms. Lani was provided with a verbal description of the nature and purpose of the present neuropsychological evaluation. Also reviewed were the foreseeable risks and/or discomforts and benefits of the procedure, limits of confidentiality, and mandatory reporting requirements of this provider. The patient was given the opportunity to ask questions and receive answers about the evaluation. Oral consent to participate was provided by the patient.   This evaluation was conducted by Christia Reading, Ph.D., licensed clinical neuropsychologist. Ms. Iseman completed a 30-minute clinical interview, billed as one unit [phone], and 125 minutes of cognitive testing, billed as one unit [phone] and three additional units 96139. Psychometrist Cruzita Lederer, B.S., assisted Dr. Melvyn Novas with test administration and scoring procedures. As a separate and discrete service, Dr. Melvyn Novas spent a total of 180 minutes in interpretation and report writing, billed as one unit 96132 and two  units 96133.

## 2019-04-13 ENCOUNTER — Encounter: Payer: Self-pay | Admitting: Psychology

## 2019-04-13 DIAGNOSIS — F01A0 Vascular dementia, mild, without behavioral disturbance, psychotic disturbance, mood disturbance, and anxiety: Secondary | ICD-10-CM

## 2019-04-13 DIAGNOSIS — I999 Unspecified disorder of circulatory system: Secondary | ICD-10-CM

## 2019-04-13 DIAGNOSIS — F0150 Vascular dementia without behavioral disturbance: Secondary | ICD-10-CM | POA: Insufficient documentation

## 2019-04-13 HISTORY — DX: Vascular dementia, mild, without behavioral disturbance, psychotic disturbance, mood disturbance, and anxiety: F01.A0

## 2019-04-13 HISTORY — DX: Vascular dementia without behavioral disturbance: F01.50

## 2019-04-13 HISTORY — DX: Unspecified disorder of circulatory system: I99.9

## 2019-04-16 ENCOUNTER — Other Ambulatory Visit: Payer: Self-pay | Admitting: Family Medicine

## 2019-04-16 DIAGNOSIS — N6320 Unspecified lump in the left breast, unspecified quadrant: Secondary | ICD-10-CM

## 2019-04-17 DIAGNOSIS — R002 Palpitations: Secondary | ICD-10-CM | POA: Diagnosis not present

## 2019-04-22 ENCOUNTER — Encounter: Payer: Self-pay | Admitting: Family Medicine

## 2019-04-24 ENCOUNTER — Other Ambulatory Visit: Payer: Self-pay | Admitting: Family Medicine

## 2019-04-24 DIAGNOSIS — H52203 Unspecified astigmatism, bilateral: Secondary | ICD-10-CM | POA: Diagnosis not present

## 2019-04-24 DIAGNOSIS — H53002 Unspecified amblyopia, left eye: Secondary | ICD-10-CM | POA: Diagnosis not present

## 2019-04-24 DIAGNOSIS — H25011 Cortical age-related cataract, right eye: Secondary | ICD-10-CM | POA: Diagnosis not present

## 2019-04-24 DIAGNOSIS — H35411 Lattice degeneration of retina, right eye: Secondary | ICD-10-CM

## 2019-04-24 DIAGNOSIS — H524 Presbyopia: Secondary | ICD-10-CM | POA: Diagnosis not present

## 2019-04-24 DIAGNOSIS — H2511 Age-related nuclear cataract, right eye: Secondary | ICD-10-CM | POA: Diagnosis not present

## 2019-04-24 DIAGNOSIS — H5203 Hypermetropia, bilateral: Secondary | ICD-10-CM | POA: Diagnosis not present

## 2019-04-24 DIAGNOSIS — Z9889 Other specified postprocedural states: Secondary | ICD-10-CM

## 2019-04-24 DIAGNOSIS — Z961 Presence of intraocular lens: Secondary | ICD-10-CM | POA: Diagnosis not present

## 2019-04-24 DIAGNOSIS — H43811 Vitreous degeneration, right eye: Secondary | ICD-10-CM

## 2019-04-24 HISTORY — DX: Vitreous degeneration, right eye: H43.811

## 2019-04-24 HISTORY — DX: Lattice degeneration of retina, right eye: H35.411

## 2019-04-24 HISTORY — DX: Other specified postprocedural states: Z98.890

## 2019-04-24 MED ORDER — POTASSIUM CHLORIDE ER 10 MEQ PO TBCR: 10.0000 meq | EXTENDED_RELEASE_TABLET | Freq: Every day | ORAL | 1 refills | Status: DC

## 2019-04-26 ENCOUNTER — Telehealth: Payer: Self-pay | Admitting: *Deleted

## 2019-04-26 ENCOUNTER — Ambulatory Visit: Payer: Medicare HMO | Admitting: Psychology

## 2019-04-26 ENCOUNTER — Encounter: Payer: Self-pay | Admitting: Psychology

## 2019-04-26 ENCOUNTER — Other Ambulatory Visit: Payer: Self-pay

## 2019-04-26 ENCOUNTER — Encounter: Payer: Self-pay | Admitting: *Deleted

## 2019-04-26 DIAGNOSIS — F01A0 Vascular dementia, mild, without behavioral disturbance, psychotic disturbance, mood disturbance, and anxiety: Secondary | ICD-10-CM

## 2019-04-26 DIAGNOSIS — F0150 Vascular dementia without behavioral disturbance: Secondary | ICD-10-CM

## 2019-04-26 NOTE — Telephone Encounter (Signed)
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 Ask if  she is still experiencing palpitations.

## 2019-04-26 NOTE — Progress Notes (Signed)
   Neuropsychology Feedback Session Tillie Rung. [facility] Department of Neurology  Reason for Referral:   Earla Dittus a 78 y.o. female referred by Alonza Bogus, D.O.,to characterize hercurrent cognitive functioning and assist with diagnostic clarity and treatment planning in the context of subjective cognitive decline and history of bilateral upper extremity tremor.  Feedback:   Ms. Snowberger completed a comprehensive neuropsychological evaluation on 04/12/2019. Please refer to that encounter for the full report and recommendations. Briefly, results suggested moderate frontal-subcortical dysfunction, as evidenced by prominent weaknesses across processing speed, attention/concentration, executive functioning, and expressive language. This pattern of performance is consistent with her history of various cardiovascular ailments and neuroimaging suggesting small vessel ischemic changes. 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 It should also be noted that expressive language deficits could be partially attributed to Ms. Kovacevic being tested in [place] rather than her native language (Spanish). Memory testing was not suggestive of Alzheimer's disease.  Ms. Cooley was unaccompanied on the current telephone call. Content of the current session focused on the results of her evaluation. Ms. Goguen was given the opportunity to ask questions and her questions were answered. She was also encouraged to reach out should additional questions arise. A copy of her report was mailed at the conclusion of the visit.      A total of 15 minutes were spent with Ms. Deleo during the current feedback session.

## 2019-04-26 NOTE — Telephone Encounter (Signed)
Telephone call to patient. Left message regarding monitor results and to call back if she is still having palpitations.

## 2019-04-27 ENCOUNTER — Ambulatory Visit
Admission: RE | Admit: 2019-04-27 | Discharge: 2019-04-27 | Disposition: A | Discharge status: Home / Self Care | Payer: Medicare HMO | Source: Ambulatory Visit | Attending: Family Medicine | Admitting: Family Medicine

## 2019-04-27 ENCOUNTER — Other Ambulatory Visit: Payer: Self-pay | Admitting: Family Medicine

## 2019-04-27 DIAGNOSIS — R928 Other abnormal and inconclusive findings on diagnostic imaging of breast: Secondary | ICD-10-CM | POA: Diagnosis not present

## 2019-04-27 DIAGNOSIS — N6489 Other specified disorders of breast: Secondary | ICD-10-CM | POA: Diagnosis not present

## 2019-04-27 DIAGNOSIS — N6320 Unspecified lump in the left breast, unspecified quadrant: Secondary | ICD-10-CM

## 2019-05-02 ENCOUNTER — Other Ambulatory Visit: Payer: Self-pay

## 2019-05-03 ENCOUNTER — Ambulatory Visit (INDEPENDENT_AMBULATORY_CARE_PROVIDER_SITE_OTHER): Payer: Medicare HMO | Admitting: Family Medicine

## 2019-05-03 ENCOUNTER — Encounter: Payer: Self-pay | Admitting: Family Medicine

## 2019-05-03 VITALS — BP 118/58 | HR 67 | Temp 97.8°F | Resp 18 | Wt 127.8 lb

## 2019-05-03 DIAGNOSIS — E039 Hypothyroidism, unspecified: Secondary | ICD-10-CM

## 2019-05-03 DIAGNOSIS — R700 Elevated erythrocyte sedimentation rate: Secondary | ICD-10-CM | POA: Diagnosis not present

## 2019-05-03 DIAGNOSIS — I10 Essential (primary) hypertension: Secondary | ICD-10-CM | POA: Diagnosis not present

## 2019-05-03 DIAGNOSIS — I5032 Chronic diastolic (congestive) heart failure: Secondary | ICD-10-CM

## 2019-05-03 DIAGNOSIS — E559 Vitamin D deficiency, unspecified: Secondary | ICD-10-CM | POA: Diagnosis not present

## 2019-05-03 DIAGNOSIS — R739 Hyperglycemia, unspecified: Secondary | ICD-10-CM

## 2019-05-03 DIAGNOSIS — D649 Anemia, unspecified: Secondary | ICD-10-CM | POA: Diagnosis not present

## 2019-05-03 DIAGNOSIS — R32 Unspecified urinary incontinence: Secondary | ICD-10-CM | POA: Diagnosis not present

## 2019-05-03 DIAGNOSIS — G2581 Restless legs syndrome: Secondary | ICD-10-CM

## 2019-05-03 DIAGNOSIS — E785 Hyperlipidemia, unspecified: Secondary | ICD-10-CM | POA: Diagnosis not present

## 2019-05-03 DIAGNOSIS — K589 Irritable bowel syndrome without diarrhea: Secondary | ICD-10-CM

## 2019-05-03 LAB — COMPREHENSIVE METABOLIC PANEL
ALT: 17 U/L (ref 0–35)
AST: 23 U/L (ref 0–37)
Albumin: 4.4 g/dL (ref 3.5–5.2)
Alkaline Phosphatase: 88 U/L (ref 39–117)
BUN: 21 mg/dL (ref 6–23)
CO2: 26 mEq/L (ref 19–32)
Calcium: 9.2 mg/dL (ref 8.4–10.5)
Chloride: 99 mEq/L (ref 96–112)
Creatinine, Ser: 1.13 mg/dL (ref 0.40–1.20)
GFR: 46.64 mL/min — ABNORMAL LOW (ref >60.00)
Glucose, Bld: 78 mg/dL (ref 70–99)
Potassium: 4.2 mEq/L (ref 3.5–5.1)
Sodium: 135 mEq/L (ref 135–145)
Total Bilirubin: 0.5 mg/dL (ref 0.2–1.2)
Total Protein: 7.2 g/dL (ref 6.0–8.3)

## 2019-05-03 LAB — URINALYSIS
Bilirubin Urine: NEGATIVE
Hgb urine dipstick: NEGATIVE
Ketones, ur: NEGATIVE
Leukocytes,Ua: NEGATIVE
Nitrite: NEGATIVE
Specific Gravity, Urine: 1.01 (ref 1.000–1.030)
Total Protein, Urine: NEGATIVE
Urine Glucose: NEGATIVE
Urobilinogen, UA: 0.2 (ref 0.0–1.0)
pH: 6 (ref 5.0–8.0)

## 2019-05-03 LAB — TSH: TSH: 1.56 u[IU]/mL (ref 0.35–4.50)

## 2019-05-03 LAB — CBC
HCT: 35.8 % — ABNORMAL LOW (ref 36.0–46.0)
Hemoglobin: 11.9 g/dL — ABNORMAL LOW (ref 12.0–15.0)
MCHC: 33.4 g/dL (ref 30.0–36.0)
MCV: 89.6 fl (ref 78.0–100.0)
Platelets: 232 10*3/uL (ref 150.0–400.0)
RBC: 3.99 Mil/uL (ref 3.87–5.11)
RDW: 15.2 % (ref 11.5–15.5)
WBC: 5.6 10*3/uL (ref 4.0–10.5)

## 2019-05-03 LAB — LIPID PANEL
Cholesterol: 165 mg/dL (ref 0–200)
HDL: 72.7 mg/dL (ref >39.00)
LDL Cholesterol: 66 mg/dL (ref 0–99)
NonHDL: 92.62
Total CHOL/HDL Ratio: 2
Triglycerides: 131 mg/dL (ref 0.0–149.0)
VLDL: 26.2 mg/dL (ref 0.0–40.0)

## 2019-05-03 LAB — HEMOGLOBIN A1C: Hgb A1c MFr Bld: 5.9 % (ref 4.6–6.5)

## 2019-05-03 LAB — VITAMIN D 25 HYDROXY (VIT D DEFICIENCY, FRACTURES): VITD: 48.95 ng/mL (ref 30.00–100.00)

## 2019-05-03 LAB — SEDIMENTATION RATE: Sed Rate: 53 mm/hr — ABNORMAL HIGH (ref 0–30)

## 2019-05-03 MED ORDER — PRAMIPEXOLE DIHYDROCHLORIDE 0.125 MG PO TABS: 0.1250 mg | ORAL_TABLET | Freq: Two times a day (BID) | ORAL | 1 refills | Status: DC

## 2019-05-03 NOTE — Patient Instructions (Addendum)
Stetching, moist heat and topic rubs with lidocaine and/or menthol  Restless Legs Syndrome Restless legs syndrome is a condition that causes uncomfortable feelings or sensations in the legs, especially while sitting or lying down. The sensations usually cause an overwhelming urge to move the legs. The arms can also sometimes be affected. The condition can range from mild to severe. The symptoms often interfere with a person's ability to sleep. What are the causes? The cause of this condition is not known. What increases the risk? The following factors may make you more likely to develop this condition:  Being older than 50.  Pregnancy.  Being a woman. In general, the condition is more common in women than in men.  A family history of the condition.  Having iron deficiency.  Overuse of caffeine, nicotine, or alcohol.  Certain medical conditions, such as kidney disease, Parkinson's disease, or nerve damage.  Certain medicines, such as those for high blood pressure, nausea, colds, allergies, depression, and some heart conditions. What are the signs or symptoms? The main symptom of this condition is uncomfortable sensations in the legs, such as:  Pulling.  Tingling.  Prickling.  Throbbing.  Crawling.  Burning. Usually, the sensations:  Affect both sides of the body.  Are worse when you sit or lie down.  Are worse at night. These may wake you up or make it difficult to fall asleep.  Make you have a strong urge to move your legs.  Are temporarily relieved by moving your legs. The arms can also be affected, but this is rare. People who have this condition often have tiredness during the day because of their lack of sleep at night. How is this diagnosed? This condition may be diagnosed based on:  Your symptoms.  Blood tests. In some cases, you may be monitored in a sleep lab by a specialist (a sleep study). This can detect any disruptions in your sleep. How is this  treated? This condition is treated by managing the symptoms. This may include:  Lifestyle changes, such as exercising, using relaxation techniques, and avoiding caffeine, alcohol, or tobacco.  Medicines. Anti-seizure medicines may be tried first. Follow these instructions at home:     General instructions  Take over-the-counter and prescription medicines only as told by your health care provider.  Use methods to help relieve the uncomfortable sensations, such as: ? Massaging your legs. ? Walking or stretching. ? Taking a cold or hot bath.  Keep all follow-up visits as told by your health care provider. This is important. Lifestyle  Practice good sleep habits. For example, go to bed and get up at the same time every day. Most adults should get 7-9 hours of sleep each night.  Exercise regularly. Try to get at least 30 minutes of exercise most days of the week.  Practice ways of relaxing, such as yoga or meditation.  Avoid caffeine and alcohol.  Do not use any products that contain nicotine or tobacco, such as cigarettes and e-cigarettes. If you need help quitting, ask your health care provider. Contact a health care provider if:  Your symptoms get worse or they do not improve with treatment. Summary  Restless legs syndrome is a condition that causes uncomfortable feelings or sensations in the legs, especially while sitting or lying down.  The symptoms often interfere with a person's ability to sleep.  This condition is treated by managing the symptoms. You may need to make lifestyle changes or take medicines. This information is not intended to replace  advice given to you by your health care provider. Make sure you discuss any questions you have with your health care provider. Document Released: 04/30/2002 Document Revised: 05/30/2017 Document Reviewed: 05/30/2017 Elsevier Patient Education  2020 [employer].

## 2019-05-05 LAB — URINE CULTURE
MICRO NUMBER:: 1184754
Result:: NO GROWTH
SPECIMEN QUALITY:: ADEQUATE

## 2019-05-07 ENCOUNTER — Other Ambulatory Visit: Payer: Self-pay | Admitting: Neurology

## 2019-05-07 DIAGNOSIS — G2581 Restless legs syndrome: Secondary | ICD-10-CM

## 2019-05-07 DIAGNOSIS — R32 Unspecified urinary incontinence: Secondary | ICD-10-CM

## 2019-05-07 HISTORY — DX: Unspecified urinary incontinence: R32

## 2019-05-07 HISTORY — DX: Restless legs syndrome: G25.81

## 2019-05-07 NOTE — Assessment & Plan Note (Signed)
On Levothyroxine, continue to monitor 

## 2019-05-07 NOTE — Assessment & Plan Note (Signed)
Supplement and monitor 

## 2019-05-07 NOTE — Assessment & Plan Note (Signed)
Well controlled, no changes to meds. Encouraged heart healthy diet such as the DASH diet and exercise as tolerated.  °

## 2019-05-07 NOTE — Assessment & Plan Note (Signed)
Mirapex 0.125 mg to 0.25 mg qhs to bid prn.

## 2019-05-07 NOTE — Assessment & Plan Note (Signed)
Urine culture is negative. She will let us know if it worsens

## 2019-05-07 NOTE — Progress Notes (Signed)
Subjective:    Patient ID: Julie Wilkerson, female    DOB: 12-25-1940, 78 y.o.   MRN: [id number]  No chief complaint on file.   HPI Patient is in today for follow up on chronic medical concerns including abdominal pain, IBS, hypertension and more. She is noting increasing trouble with her legs feeling uncomfortable while sitting still especially in evening. No fevers or chills. Her appetite and energy are improving. Denies CP/palp/SOB/HA/congestion/fevers or GU c/o. Taking meds as prescribed  Past Medical History:  Diagnosis Date  . Anemia   . Anginal pain ([place])   . Arthritis   . Asthma   . Breast cancer ([place])    2002, left, encapsulated, microcalcifications. lumpectomy, radtiation x 30  . Breast cancer in female [facility])   . Change in mole 06/08/2016  . Colitis   . Complication of anesthesia    VERY SENSITIVE TO ANESTHESIA   . Decreased visual acuity 01/16/2017  . Dyspnea 11/04/2016   with asthma exacerbation only  . Dysuria 11/04/2016  . Gluten intolerance   . History of chicken pox   . History of Helicobacter pylori infection 08/01/2015  . Hyperlipidemia   . Hypertension   . Hypothyroidism   . IBS (irritable bowel syndrome)   . Left leg pain 03/01/2017  . Low back pain 08/10/2015  . Mild vascular neurocognitive disorder ([place]) 04/13/2019  . Neck pain 03/01/2017  . Osteoporosis   . Personal history of radiation therapy   . Pneumonia   . PONV (postoperative nausea and vomiting)   . RLQ discomfort 03/01/2017  . Stomach cramps   . Urinary tract infection 11/04/2016    Past Surgical History:  Procedure Laterality Date  . ABDOMINAL HYSTERECTOMY     partial  . APPENDECTOMY    . BREAST LUMPECTOMY Left 2002  . CHOLECYSTECTOMY  Age 71 or 54  . COLONOSCOPY  2017  . EYE SURGERY Left    cataract  . LEFT HEART CATH AND CORONARY ANGIOGRAPHY N/A 02/09/2019   Procedure: LEFT HEART CATH AND CORONARY ANGIOGRAPHY;  Surgeon: Nelva Bush, MD;  Location: [facility];  Service:  Cardiovascular;  Laterality: N/A;  . TONSILLECTOMY    . 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 Colitis Mother   . Irritable bowel syndrome Mother   . Hypertension Father   . Hyperlipidemia Brother   . Heart attack Brother   . Stomach cancer Paternal Grandmother 43  . Hyperlipidemia Brother   . Heart attack Brother   . Hyperlipidemia Brother   . Heart attack Brother   . Hyperlipidemia Sister   . Leukemia Daughter   . Arthritis Daughter   . Heart disease Daughter        ASD vs VSD  . Colon cancer Neg Hx     Social History   Socioeconomic History  . Marital status: Single    Spouse name: Not on file  . Number of children: 3  . Years of education: 76  . Highest education level: Master's degree (e.g., MA, MS, MEng, MEd, MSW, MBA)  Occupational History  . Occupation: retired    Comment: Pharmacist, hospital, kindergarten  Tobacco Use  . Smoking status: Never Smoker  . Smokeless tobacco: Never Used  Substance and Sexual Activity  . Alcohol use: Never  Alcohol/week: 0.0 standard drinks  . Drug use: Never  . Sexual activity: Not Currently    Birth control/protection: None    Comment: lives alone, avoids daiiry and gluten. 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 Not on file  Social History Narrative  . Not on file   Social Determinants of Health   Financial Resource Strain:   . Difficulty of Paying Living Expenses: Not on file  Food Insecurity:   . Worried About Charity fundraiser in the Last Year: Not on file  . Ran Out of Food in the Last Year: Not on file  Transportation Needs:   . Lack of Transportation (Medical): Not on file  . Lack of Transportation (Non-Medical): Not on file  Physical Activity:   . Days of Exercise per Week: Not on file  . Minutes of  Exercise per Session: Not on file  Stress:   . Feeling of Stress : Not on file  Social Connections:   . Frequency of Communication with Friends and Family: Not on file  . Frequency of Social Gatherings with Friends and Family: Not on file  . Attends Religious Services: Not on file  . Active Member of Clubs or Organizations: Not on file  . Attends Archivist Meetings: Not on file  . Marital Status: Not on file  Intimate Partner Violence:   . Fear of Current or Ex-Partner: Not on file  . Emotionally Abused: Not on file  . Physically Abused: Not on file  . Sexually Abused: Not on file    Outpatient Medications Prior to Visit  Medication Sig Dispense Refill  . albuterol (VENTOLIN HFA) 108 (90 Base) MCG/ACT inhaler Inhale 2 puffs into the lungs every 6 (six) hours as needed for wheezing or shortness of breath. 18 g 2  . ASPIRIN 81 PO 81 mg 2 (two) times daily.    Marland Kitchen atorvastatin (LIPITOR) 20 MG tablet Take 1 tablet (20 mg total) by mouth daily. 90 tablet 3  . citalopram (CELEXA) 20 MG tablet Take 1 tablet (20 mg total) by mouth at bedtime. 90 tablet 1  . docusate sodium (COLACE) 100 MG capsule Take 1 capsule (100 mg total) by mouth 2 (two) times daily. 28 capsule 0  . fluticasone (FLOVENT HFA) 110 MCG/ACT inhaler Inhale 1 puff into the lungs 2 (two) times daily. (Patient taking differently: Inhale 1 puff into the lungs 2 (two) times daily as needed (shortness of breath or wheezing). ) 1 Inhaler 12  . furosemide (LASIX) 40 MG tablet Take 1 tablet (40 mg total) by mouth daily. 90 tablet 1  . levothyroxine (SYNTHROID) 50 MCG tablet Take 1 tablet by mouth once daily 90 tablet 0  . lisinopril (ZESTRIL) 5 MG tablet Take 1 tablet (5 mg total) by mouth 2 (two) times daily. 60 tablet 2  . metoprolol succinate (TOPROL-XL) 25 MG 24 hr tablet Take 1 tablet (25 mg total) by mouth daily. (Patient taking differently: Take 25 mg by mouth at bedtime. ) 30 tablet 11  . omega-3 acid ethyl esters  (LOVAZA) 1 g capsule Take 2 g by mouth daily.    Marland Kitchen omeprazole (PRILOSEC) 40 MG capsule Take 1 capsule (40 mg total) by mouth daily. 30 capsule 4  . ondansetron (ZOFRAN) 4 MG tablet Take 1 tablet (4 mg total) by mouth every 8 (eight) hours as needed for nausea or vomiting. 30 tablet 1  . polyethylene glycol (MIRALAX / GLYCOLAX) 17 g packet Take 17 g by mouth 2 (  two) times daily. (Patient taking differently: Take 17 g by mouth daily. ) 28 packet 0  . potassium chloride (KLOR-CON) 10 MEQ tablet Take 1 tablet (10 mEq total) by mouth daily. 90 tablet 1  . senna-docusate (SENOKOT-S) 8.6-50 MG tablet Take 2 tablets by mouth 2 (two) times daily. (Patient taking differently: Take 2 tablets by mouth as needed. )    . primidone (MYSOLINE) 50 MG tablet Take 1 tablet (50 mg total) by mouth at bedtime. 90 tablet 1  . ferrous sulfate (FERROUSUL) 325 (65 FE) MG tablet Take 1 tablet (325 mg total) by mouth 3 (three) times daily with meals for 14 days. 42 tablet 0   No facility-administered medications prior to visit.    No Known Allergies  Review of Systems  Constitutional: Positive for malaise/fatigue. Negative for chills and fever.  HENT: Negative for congestion and hearing loss.   Eyes: Negative for discharge.  Respiratory: Negative for cough, sputum production and shortness of breath.   Cardiovascular: Negative for chest pain, palpitations and leg swelling.  Gastrointestinal: Positive for abdominal pain. Negative for blood in stool, constipation, diarrhea, heartburn, nausea and vomiting.  Genitourinary: Negative for dysuria, frequency, hematuria and urgency.  Musculoskeletal: Positive for back pain and joint pain. Negative for falls and myalgias.  Skin: Negative for rash.  Neurological: Negative for dizziness, sensory change, loss of consciousness, weakness and headaches.  Endo/Heme/Allergies: Negative for environmental allergies. Does not bruise/bleed easily.  Psychiatric/Behavioral: Negative for  depression and suicidal ideas. The patient is not nervous/anxious and does not have insomnia.        Objective:    Physical Exam Vitals and nursing note reviewed.  Constitutional:      General: She is not in acute distress.    Appearance: She is well-developed.  HENT:     Head: Normocephalic and atraumatic.     Nose: Nose normal.  Eyes:     General:        Right eye: No discharge.        Left eye: No discharge.  Cardiovascular:     Rate and Rhythm: Normal rate and regular rhythm.     Heart sounds: No murmur.  Pulmonary:     Effort: Pulmonary effort is normal.     Breath sounds: Normal breath sounds.  Abdominal:     General: Bowel sounds are normal.     Palpations: Abdomen is soft.     Tenderness: There is no abdominal tenderness.  Musculoskeletal:     Cervical back: Normal range of motion and neck supple.  Skin:    General: Skin is warm and dry.  Neurological:     Mental Status: She is alert and oriented to person, place, and time.     BP (!) 118/58 (BP Location: Left Arm, Patient Position: Sitting, Cuff Size: Normal)   Pulse 67   Temp 97.8 F (36.6 C) (Temporal)   Resp 18   Wt 127 lb 12.8 oz (58 kg)   SpO2 95%   BMI 26.71 kg/m  Wt Readings from Last 3 Encounters:  05/03/19 127 lb 12.8 oz (58 kg)  04/03/19 126 lb (57.2 kg)  02/26/19 124 lb (56.2 kg)    Diabetic Foot Exam - Simple   No data filed     Lab Results  Component Value Date   WBC 5.6 05/03/2019   HGB 11.9 (L) 05/03/2019   HCT 35.8 (L) 05/03/2019   PLT 232.0 05/03/2019   GLUCOSE 78 05/03/2019   CHOL 165 05/03/2019  TRIG 131.0 05/03/2019   HDL 72.70 05/03/2019   LDLCALC 66 05/03/2019   ALT 17 05/03/2019   AST 23 05/03/2019   NA 135 05/03/2019   K 4.2 05/03/2019   CL 99 05/03/2019   CREATININE 1.13 05/03/2019   BUN 21 05/03/2019   CO2 26 05/03/2019   TSH 1.56 05/03/2019   INR 1.2 02/16/2019   HGBA1C 5.9 05/03/2019    Lab Results  Component Value Date   TSH 1.56 05/03/2019    Lab Results  Component Value Date   WBC 5.6 05/03/2019   HGB 11.9 (L) 05/03/2019   HCT 35.8 (L) 05/03/2019   MCV 89.6 05/03/2019   PLT 232.0 05/03/2019   Lab Results  Component Value Date   NA 135 05/03/2019   K 4.2 05/03/2019   CO2 26 05/03/2019   GLUCOSE 78 05/03/2019   BUN 21 05/03/2019   CREATININE 1.13 05/03/2019   BILITOT 0.5 05/03/2019   ALKPHOS 88 05/03/2019   AST 23 05/03/2019   ALT 17 05/03/2019   PROT 7.2 05/03/2019   ALBUMIN 4.4 05/03/2019   CALCIUM 9.2 05/03/2019   ANIONGAP 11 02/24/2019   GFR 46.64 (L) 05/03/2019   Lab Results  Component Value Date   CHOL 165 05/03/2019   Lab Results  Component Value Date   HDL 72.70 05/03/2019   Lab Results  Component Value Date   LDLCALC 66 05/03/2019   Lab Results  Component Value Date   TRIG 131.0 05/03/2019   Lab Results  Component Value Date   CHOLHDL 2 05/03/2019   Lab Results  Component Value Date   HGBA1C 5.9 05/03/2019       Assessment & Plan:   Problem List Items Addressed This Visit    Essential hypertension (Chronic)    Well controlled, no changes to meds. Encouraged heart healthy diet such as the DASH diet and exercise as tolerated.       Relevant Orders   CBC (Completed)   CMP (Completed)   SED RATE (Completed)   Hyperlipidemia   Relevant Orders   Lipid panel (Completed)   Hypothyroid    On Levothyroxine, continue to monitor      Relevant Orders   TSH (Completed)   IBS (irritable bowel syndrome)    Bowels moving better but still has occasional discomfort. Maintain adequate hydration and fiber supplementation. Report worsening symptoms and check labs.       Vitamin D deficiency - Primary    Supplement and monitor      Relevant Orders   VITAMIN D (Completed)   Hyperglycemia   Relevant Orders   A1C (Completed)   Elevated sed rate   Relevant Orders   SED RATE (Completed)   Anemia   CHF (congestive heart failure) (HCC)    No recent exacerbation      Urinary  incontinence    Urine culture is negative. She will let us know if it worsens      Relevant Orders   Urinalysis (LAB COLLECT) (Completed)   Urine culture (Completed)   RLS (restless legs syndrome)    Mirapex 0.125 mg to 0.25 mg qhs to bid prn.          I am having Remedi Stankus. Milks start on pramipexole. I am also having her maintain her metoprolol succinate, fluticasone, omega-3 acid ethyl esters, atorvastatin, ferrous sulfate, docusate sodium, polyethylene glycol, senna-docusate, ondansetron, albuterol, lisinopril, furosemide, citalopram, omeprazole, ASPIRIN 81 PO, potassium chloride, and levothyroxine.  Meds ordered this encounter  Medications  .  pramipexole (MIRAPEX) 0.125 MG tablet    Sig: Take 1 tablet (0.125 mg total) by mouth 2 (two) times daily.    Dispense:  60 tablet    Refill:  1     Penni Homans, MD

## 2019-05-07 NOTE — Assessment & Plan Note (Signed)
Bowels moving better but still has occasional discomfort. Maintain adequate hydration and fiber supplementation. Report worsening symptoms and check labs.

## 2019-05-07 NOTE — Assessment & Plan Note (Signed)
No recent exacerbation 

## 2019-05-28 ENCOUNTER — Encounter: Payer: Self-pay | Admitting: Cardiology

## 2019-05-28 ENCOUNTER — Ambulatory Visit (INDEPENDENT_AMBULATORY_CARE_PROVIDER_SITE_OTHER): Payer: Medicare HMO | Admitting: Cardiology

## 2019-05-28 ENCOUNTER — Other Ambulatory Visit: Payer: Self-pay

## 2019-05-28 VITALS — BP 130/68 | HR 56 | Ht <= 58 in | Wt 124.0 lb

## 2019-05-28 DIAGNOSIS — I2510 Atherosclerotic heart disease of native coronary artery without angina pectoris: Secondary | ICD-10-CM | POA: Insufficient documentation

## 2019-05-28 DIAGNOSIS — I5032 Chronic diastolic (congestive) heart failure: Secondary | ICD-10-CM

## 2019-05-28 DIAGNOSIS — I471 Supraventricular tachycardia: Secondary | ICD-10-CM | POA: Diagnosis not present

## 2019-05-28 DIAGNOSIS — E782 Mixed hyperlipidemia: Secondary | ICD-10-CM

## 2019-05-28 DIAGNOSIS — I10 Essential (primary) hypertension: Secondary | ICD-10-CM | POA: Diagnosis not present

## 2019-05-28 DIAGNOSIS — I4719 Other supraventricular tachycardia: Secondary | ICD-10-CM | POA: Insufficient documentation

## 2019-05-28 HISTORY — DX: Chronic diastolic (congestive) heart failure: I50.32

## 2019-05-28 HISTORY — DX: Atherosclerotic heart disease of native coronary artery without angina pectoris: I25.10

## 2019-05-28 HISTORY — DX: Supraventricular tachycardia: I47.1

## 2019-05-28 HISTORY — DX: Other supraventricular tachycardia: I47.19

## 2019-05-28 NOTE — Progress Notes (Signed)
Cardiology Office Note:    Date:  05/28/2019   ID:  Julie Wilkerson, DOB 02/06/1941, MRN [id number]  PCP:  [name], MD  Cardiologist:  Berniece Salines, DO  Electrophysiologist:  None   Referring MD: [name], MD   Chief Complaint  Patient presents with  . Coronary Artery Disease  . Follow-up    History of Present Illness:    Julie Wilkerson is a 79 y.o. female with a hx of hypertension, mild to moderate coronary artery disease, hyperlipidemia, chronic diastolic heart failure presents for a follow-up visit today.  I saw the patient February 26, 2019 at that time she was status post hip surgery on 02/13/2019.  Post surgery her recovery was complicated by acute on chronic diastolic heart failure due to not being on Lasix post surgical discharge.  At that visit she was going from Lasix 40 mg daily.  I did keep patient on her Lasix and ask for a follow-up visit.   On April 03, 2019 to see the patient at which time she was was recovering from her surgery.  During that visit she told me that she was experiencing intermittent palpitation therefore a ZIO monitor was placed on the patient to rule out arrhythmias.  She did wear her monitor in the interim the results were previously called out the patient however she is here today for follow-up visit.  She tells me that she is expressing intermittent hot flashes and some palpitations.  She also notes that she has been having increasing stomach ache which has been a problem in the past but resolved and now recently she gets pain in her stomach when she eats.  She denies chest, no shortness of breath  Past Medical History:  Diagnosis Date  . Anemia   . Anginal pain ([place])   . Arthritis   . Asthma   . Breast cancer ([place])    2002, left, encapsulated, microcalcifications. lumpectomy, radtiation x 30  . Breast cancer in female [facility])   . Change in mole 06/08/2016  . Colitis   . Complication of anesthesia    VERY SENSITIVE TO  ANESTHESIA   . Decreased visual acuity 01/16/2017  . Dyspnea 11/04/2016   with asthma exacerbation only  . Dysuria 11/04/2016  . Gluten intolerance   . History of chicken pox   . History of Helicobacter pylori infection 08/01/2015  . Hyperlipidemia   . Hypertension   . Hypothyroidism   . IBS (irritable bowel syndrome)   . Left leg pain 03/01/2017  . Low back pain 08/10/2015  . 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 Neck pain 03/01/2017  . Osteoporosis   . Personal history of radiation therapy   . Pneumonia   . PONV (postoperative nausea and vomiting)   . RLQ discomfort 03/01/2017  . Stomach cramps   . Urinary tract infection 11/04/2016    Past Surgical History:  Procedure Laterality Date  . ABDOMINAL HYSTERECTOMY     partial  . APPENDECTOMY    . BREAST LUMPECTOMY Left 2002  . CHOLECYSTECTOMY  Age 23 or 64  . COLONOSCOPY  2017  . EYE SURGERY Left    cataract  . LEFT HEART CATH AND CORONARY ANGIOGRAPHY N/A 02/09/2019   Procedure: LEFT HEART CATH AND CORONARY ANGIOGRAPHY;  Surgeon: Nelva Bush, MD;  Location: [facility];  Service: Cardiovascular;  Laterality: N/A;  . TONSILLECTOMY    . TOTAL HIP ARTHROPLASTY Left 02/13/2019   Procedure: TOTAL HIP ARTHROPLASTY ANTERIOR  APPROACH;  Surgeon: Paralee Cancel, MD;  Location: [facility];  Service: Orthopedics;  Laterality: Left;  70 mins    Current Medications: Current Meds  Medication Sig  . albuterol (VENTOLIN HFA) 108 (90 Base) MCG/ACT inhaler Inhale 2 puffs into the lungs every 6 (six) hours as needed for wheezing or shortness of breath.  . ASPIRIN 81 PO 81 mg 2 (two) times daily.  Marland Kitchen atorvastatin (LIPITOR) 20 MG tablet Take 1 tablet (20 mg total) by mouth daily.  . citalopram (CELEXA) 20 MG tablet Take 1 tablet (20 mg total) by mouth at bedtime.  . fluticasone (FLOVENT HFA) 110 MCG/ACT inhaler Inhale 1 puff into the lungs 2 (two) times daily. (Patient taking differently: Inhale 1 puff into the lungs 2 (two)  times daily as needed (shortness of breath or wheezing). )  . furosemide (LASIX) 40 MG tablet Take 1 tablet (40 mg total) by mouth daily.  Marland Kitchen levothyroxine (SYNTHROID) 50 MCG tablet Take 1 tablet by mouth once daily  . lisinopril (ZESTRIL) 5 MG tablet Take 1 tablet (5 mg total) by mouth 2 (two) times daily.  . metoprolol succinate (TOPROL-XL) 25 MG 24 hr tablet Take 1 tablet (25 mg total) by mouth daily. (Patient taking differently: Take 25 mg by mouth at bedtime. )  . omega-3 acid ethyl esters (LOVAZA) 1 g capsule Take 2 g by mouth daily.  Marland Kitchen omeprazole (PRILOSEC) 40 MG capsule Take 1 capsule (40 mg total) by mouth daily.  . ondansetron (ZOFRAN) 4 MG tablet Take 1 tablet (4 mg total) by mouth every 8 (eight) hours as needed for nausea or vomiting.  . potassium chloride (KLOR-CON) 10 MEQ tablet Take 1 tablet (10 mEq total) by mouth daily.  . pramipexole (MIRAPEX) 0.125 MG tablet Take 1 tablet (0.125 mg total) by mouth 2 (two) times daily.  . primidone (MYSOLINE) 50 MG tablet TAKE 1 TABLET BY MOUTH AT BEDTIME     Allergies:   Patient has no known allergies.   Social History   Socioeconomic History  . Marital status: Single    Spouse name: Not on file  . Number of children: 3  . Years of education: 38  . Highest education level: Master's degree (e.g., MA, MS, MEng, MEd, MSW, MBA)  Occupational History  . Occupation: retired    Comment: Pharmacist, hospital, kindergarten  Tobacco Use  . Smoking status: Never Smoker  . Smokeless tobacco: Never Used  Substance and Sexual Activity  . Alcohol use: Never    Alcohol/week: 0.0 standard drinks  . Drug use: Never  . Sexual activity: Not Currently    Birth control/protection: None    Comment: lives alone, avoids daiiry and gluten. 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 Not on file  Social History Narrative  . Not on file   Social Determinants of Health   Financial Resource Strain:   . Difficulty of Paying Living Expenses: Not on file    Food Insecurity:   . Worried About Charity fundraiser in the Last Year: Not on file  . Ran Out of Food in the Last Year: Not on file  Transportation Needs:   . Lack of Transportation (Medical): Not on file  . Lack of Transportation (Non-Medical): Not on file  Physical Activity:   . Days of Exercise per Week: Not on file  . Minutes of Exercise per Session: Not on file  Stress:   . Feeling of Stress : Not on file  Social Connections:   . Frequency  of Communication with Friends and Family: Not on file  . Frequency of Social Gatherings with Friends and Family: Not on file  . Attends Religious Services: Not on file  . Active Member of Clubs or Organizations: Not on file  . Attends Archivist Meetings: Not on file  . Marital Status: Not on file     Family History: The patient's family history includes Arthritis in her daughter; Colitis in her mother; Heart attack in her brother, brother, and brother; Heart disease in her daughter; Hyperlipidemia in her brother, brother, brother, and sister; Hypertension in her father; Irritable bowel syndrome in her mother; Leukemia in her daughter; Stomach cancer (age of onset: 31) in her paternal grandmother. There is no history of Colon cancer.  ROS:   Review of Systems  Constitution: Negative for decreased appetite, fever and weight gain.  HENT: Negative for congestion, ear discharge, hoarse voice and sore throat.   Eyes: Negative for discharge, redness, vision loss in right eye and visual halos.  Cardiovascular: Negative for chest pain, dyspnea on exertion, leg swelling, orthopnea and palpitations.  Respiratory: Negative for cough, hemoptysis, shortness of breath and snoring.   Endocrine: Negative for heat intolerance and polyphagia.  Hematologic/Lymphatic: Negative for bleeding problem. Does not bruise/bleed easily.  Skin: Negative for flushing, nail changes, rash and suspicious lesions.  Musculoskeletal: Negative for arthritis, joint  pain, muscle cramps, myalgias, neck pain and stiffness.  Gastrointestinal: Negative for abdominal pain, bowel incontinence, diarrhea and excessive appetite.  Genitourinary: Negative for decreased libido, genital sores and incomplete emptying.  Neurological: Negative for brief paralysis, focal weakness, headaches and loss of balance.  Psychiatric/Behavioral: Negative for altered mental status, depression and suicidal ideas.  Allergic/Immunologic: Negative for HIV exposure and persistent infections.    EKGs/Labs/Other Studies Reviewed:    The following studies were reviewed today:   EKG: None today  The patient wore the monitor for 5 day starting 04/03/2019. Indication: Palpitations The minimum heart rate was 45 bpm, the maximum heart rate was 144 bpm, and average heart rate was 59 bpm. Predominant underlying rhythm was Sinus Rhythm.  9 Supraventricular Tachycardia runs occurred, the run with the fastest interval lasting 5 beats with a max rate of 144 bpm, the longest lasting 13 beats with an avg rate of 109 bpm. Premature atrial complexes were rare (<1.0%). Premature ventricular complexes were rare (<1.0%). No patient triggered events were noted.  No Ventricular tachycardia, No pauses, No AV block and no atrial fibrillation present.  Conclusion: This study is remarkable for supraventricular tachycardia which is likely atrial tachycardia with variable block.  Recent Labs: 02/22/2019: B Natriuretic Peptide 310.7 02/26/2019: Pro B Natriuretic peptide (BNP) 62.0 04/03/2019: Magnesium 1.8 05/03/2019: ALT 17; BUN 21; Creatinine, Ser 1.13; Hemoglobin 11.9; Platelets 232.0; Potassium 4.2; Sodium 135; TSH 1.56  Recent Lipid Panel    Component Value Date/Time   CHOL 165 05/03/2019 1118   TRIG 131.0 05/03/2019 1118   HDL 72.70 05/03/2019 1118   CHOLHDL 2 05/03/2019 1118   VLDL 26.2 05/03/2019 1118   LDLCALC 66 05/03/2019 1118    Physical Exam:    VS:  BP 130/68 (BP Location: Left Arm,  Patient Position: Sitting, Cuff Size: Normal)   Pulse (!) 56   Ht 4\' 10"  (1.473 m)   Wt 124 lb (56.2 kg)   SpO2 94%   BMI 25.92 kg/m     Wt Readings from Last 3 Encounters:  05/28/19 124 lb (56.2 kg)  05/03/19 127 lb 12.8 oz (58 kg)  04/03/19 126 lb (57.2 kg)     GEN: Well nourished, well developed in no acute distress HEENT: Normal NECK: No JVD; No carotid bruits LYMPHATICS: No lymphadenopathy CARDIAC: S1S2 noted,RRR, no murmurs, rubs, gallops RESPIRATORY:  Clear to auscultation without rales, wheezing or rhonchi  ABDOMEN: Soft, non-tender, non-distended, +bowel sounds, no guarding. EXTREMITIES: No edema, No cyanosis, no clubbing MUSCULOSKELETAL:  No edema; No deformity  SKIN: Warm and dry NEUROLOGIC:  Alert and oriented x 3, non-focal PSYCHIATRIC:  Normal affect, good insight  ASSESSMENT:    1. Essential hypertension   2. Mixed hyperlipidemia   3. Paroxysmal atrial tachycardia ([place])   4. Coronary artery disease involving native coronary artery of native heart without angina pectoris   5. Chronic diastolic congestive heart failure ([place])    PLAN:    1. Qiana has recovered well from her surgery.  From a cardiovascular standpoint she is stable.  The patient is very pleased with her recovery.  I again discussed the result from her ZIO monitor which showed 9 episodes of SVT which is likely atrial tachycardia with variable block.  Currently on Toprol-XL 25 mg daily we will continue this for now.  No changes will be made to her medications at this time.  2.  She is seeing her PCP on Thursday this week at which time she will discuss in more detail the hot flashes episodes as well as her stomach ache for potential further evaluation.   The patient is in agreement with the above plan. The patient left the office in stable condition.  The patient will follow up in 6 months or sooner if needed   Medication Adjustments/Labs and Tests Ordered: Current medicines are reviewed at length  with the patient today.  Concerns regarding medicines are outlined above.  No orders of the defined types were placed in this encounter.  No orders of the defined types were placed in this encounter.   Patient Instructions  Medication Instructions:  Your physician recommends that you continue on your current medications as directed. Please refer to the Current Medication list given to you today.  *If you need a refill on your cardiac medications before your next appointment, please call your pharmacy*  Lab Work: None If you have labs (blood work) drawn today and your tests are completely normal, you will receive your results only by: Marland Kitchen MyChart Message (if you have MyChart) OR . A paper copy in the mail If you have any lab test that is abnormal or we need to change your treatment, we will call you to review the results.  Testing/Procedures: NOne  Follow-Up: At [facility], you and your health needs are our priority.  As part of our continuing mission to provide you with exceptional heart care, we have created designated Provider Care Teams.  These Care Teams include your primary Cardiologist (physician) and Advanced Practice Providers (APPs -  Physician Assistants and Nurse Practitioners) who all work together to provide you with the care you need, when you need it.  Your next appointment:   6 month(s)  The format for your next appointment:   In Person  Provider:   Berniece Salines, DO  Other Instructions      Adopting a Healthy Lifestyle.  Know what a healthy weight is for you (roughly BMI <25) and aim to maintain this   Aim for 7+ servings of fruits and vegetables daily   65-80+ fluid ounces of water or unsweet tea for healthy kidneys   Limit to max  1 drink of alcohol per day; avoid smoking/tobacco   Limit animal fats in diet for cholesterol and heart health - choose grass fed whenever available   Avoid highly processed foods, and foods high in saturated/trans fats    Aim for low stress - take time to unwind and care for your mental health   Aim for 150 min of moderate intensity exercise weekly for heart health, and weights twice weekly for bone health   Aim for 7-9 hours of sleep daily   When it comes to diets, agreement about the perfect plan isnt easy to find, even among the experts. Experts at the [employer] developed an idea known as the Healthy Eating Plate. Just imagine a plate divided into logical, healthy portions.   The emphasis is on diet quality:   Load up on vegetables and fruits - one-half of your plate: Aim for color and variety, and remember that potatoes dont count.   Go for whole grains - one-quarter of your plate: Whole wheat, barley, wheat berries, quinoa, oats, brown rice, and foods made with them. If you want pasta, go with whole wheat pasta.   Protein power - one-quarter of your plate: Fish, chicken, beans, and nuts are all healthy, versatile protein sources. Limit red meat.   The diet, however, does go beyond the plate, offering a few other suggestions.   Use healthy plant oils, such as olive, canola, soy, corn, sunflower and peanut. Check the labels, and avoid partially hydrogenated oil, which have unhealthy trans fats.   If youre thirsty, drink water. Coffee and tea are good in moderation, but skip sugary drinks and limit milk and dairy products to one or two daily servings.   The type of carbohydrate in the diet is more important than the amount. Some sources of carbohydrates, such as vegetables, fruits, whole grains, and beans-are healthier than others.   Finally, stay active  Signed, Berniece Salines, DO  05/28/2019 10:58 AM    [place]

## 2019-05-28 NOTE — Patient Instructions (Signed)
Medication Instructions:  Your physician recommends that you continue on your current medications as directed. Please refer to the Current Medication list given to you today.  *If you need a refill on your cardiac medications before your next appointment, please call your pharmacy*  Lab Work: None If you have labs (blood work) drawn today and your tests are completely normal, you will receive your results only by: Marland Kitchen MyChart Message (if you have MyChart) OR . A paper copy in the mail If you have any lab test that is abnormal or we need to change your treatment, we will call you to review the results.  Testing/Procedures: NOne  Follow-Up: At [facility], you and your health needs are our priority.  As part of our continuing mission to provide you with exceptional heart care, we have created designated Provider Care Teams.  These Care Teams include your primary Cardiologist (physician) and Advanced Practice Providers (APPs -  Physician Assistants and Nurse Practitioners) who all work together to provide you with the care you need, when you need it.  Your next appointment:   6 month(s)  The format for your next appointment:   In Person  Provider:   Berniece Salines, DO  Other Instructions

## 2019-06-01 ENCOUNTER — Encounter: Payer: Self-pay | Admitting: Family Medicine

## 2019-06-01 ENCOUNTER — Ambulatory Visit (INDEPENDENT_AMBULATORY_CARE_PROVIDER_SITE_OTHER): Payer: Medicare HMO | Admitting: Family Medicine

## 2019-06-01 ENCOUNTER — Other Ambulatory Visit: Payer: Self-pay

## 2019-06-01 DIAGNOSIS — E559 Vitamin D deficiency, unspecified: Secondary | ICD-10-CM | POA: Diagnosis not present

## 2019-06-01 DIAGNOSIS — R1012 Left upper quadrant pain: Secondary | ICD-10-CM | POA: Insufficient documentation

## 2019-06-01 DIAGNOSIS — Z8719 Personal history of other diseases of the digestive system: Secondary | ICD-10-CM

## 2019-06-01 DIAGNOSIS — R739 Hyperglycemia, unspecified: Secondary | ICD-10-CM | POA: Diagnosis not present

## 2019-06-01 DIAGNOSIS — R197 Diarrhea, unspecified: Secondary | ICD-10-CM | POA: Diagnosis not present

## 2019-06-01 DIAGNOSIS — E782 Mixed hyperlipidemia: Secondary | ICD-10-CM | POA: Diagnosis not present

## 2019-06-01 HISTORY — DX: Personal history of other diseases of the digestive system: Z87.19

## 2019-06-01 HISTORY — DX: Left upper quadrant pain: R10.12

## 2019-06-01 MED ORDER — FAMOTIDINE 40 MG PO TABS: 40.0000 mg | ORAL_TABLET | Freq: Every day | ORAL | 3 refills | Status: DC

## 2019-06-01 NOTE — Assessment & Plan Note (Signed)
Supplement and monitor 

## 2019-06-01 NOTE — Assessment & Plan Note (Signed)
She had an episode of rectal bleeding with blood in the bowl about 4 days ago. Then a couple episodes of black stool after that. These have stopped but she is advised if they return she needs to seek care. For now she will monitor symptoms and she develops weakness, syncope or other concerns also seek care. Will arrange for cbc and iron studies as well as iFOB testing.

## 2019-06-01 NOTE — Progress Notes (Signed)
Virtual Visit via phone Note  I connected with Julie Wilkerson on 06/01/19 at 10:40 AM EST by a phone enabled telemedicine application and verified that I am speaking with the correct person using two identifiers.  Location: Patient: home Provider: home   I discussed the limitations of evaluation and management by telemedicine and the availability of in person appointments. The patient expressed understanding and agreed to proceed. Julie Wilkerson, CMA was able to get the patient set up on a visit, phone after being unable to set up a video visit   Subjective:    Patient ID: Julie Wilkerson, female    DOB: May 08, 1941, 79 y.o.   MRN: [id number]  No chief complaint on file.   HPI Patient is in today for follow up on chronic medical concerns including anemia, abdominal pain and more She has been having pain for 4 days. Slowly improving and start after she ate some yogurt. She had an episode of rectal bleeding that day and then some black stool but that has resolved. The LUQ pain waxes and wanes and is worse after she eats. She restarted Sucralfate and she feels that has been helpful. She denies any fevers or chills. Denies CP/palp/SOB/HA/congestion/fevers or GU c/o. Taking meds as prescribed Past Medical History:  Diagnosis Date  . Anemia   . Anginal pain ([place])   . Arthritis   . Asthma   . Breast cancer ([place])    2002, left, encapsulated, microcalcifications. lumpectomy, radtiation x 30  . Breast cancer in female [facility])   . Change in mole 06/08/2016  . Colitis   . Complication of anesthesia    VERY SENSITIVE TO ANESTHESIA   . Decreased visual acuity 01/16/2017  . Dyspnea 11/04/2016   with asthma exacerbation only  . Dysuria 11/04/2016  . Gluten intolerance   . History of chicken pox   . History of Helicobacter pylori infection 08/01/2015  . Hyperlipidemia   . Hypertension   . Hypothyroidism   . IBS (irritable bowel syndrome)   . Left leg pain 03/01/2017  . Low back pain  08/10/2015  . 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 Neck pain 03/01/2017  . Osteoporosis   . Personal history of radiation therapy   . Pneumonia   . PONV (postoperative nausea and vomiting)   . RLQ discomfort 03/01/2017  . Stomach cramps   . Urinary tract infection 11/04/2016    Past Surgical History:  Procedure Laterality Date  . ABDOMINAL HYSTERECTOMY     partial  . APPENDECTOMY    . BREAST LUMPECTOMY Left 2002  . CHOLECYSTECTOMY  Age 40 or 48  . COLONOSCOPY  2017  . EYE SURGERY Left    cataract  . LEFT HEART CATH AND CORONARY ANGIOGRAPHY N/A 02/09/2019   Procedure: LEFT HEART CATH AND CORONARY ANGIOGRAPHY;  Surgeon: Nelva Bush, MD;  Location: [facility];  Service: Cardiovascular;  Laterality: N/A;  . TONSILLECTOMY    . 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 Colitis Mother   . Irritable bowel syndrome Mother   . Hypertension Father   . Hyperlipidemia Brother   . Heart attack Brother   . Stomach cancer Paternal Grandmother 39  . Hyperlipidemia Brother   . Heart attack Brother   . Hyperlipidemia Brother   .  Heart attack Brother   . Hyperlipidemia Sister   . Leukemia Daughter   . Arthritis Daughter   . Heart disease Daughter        ASD vs VSD  . Colon cancer Neg Hx     Social History   Socioeconomic History  . Marital status: Single    Spouse name: Not on file  . Number of children: 3  . Years of education: 53  . Highest education level: Master's degree (e.g., MA, MS, MEng, MEd, MSW, MBA)  Occupational History  . Occupation: retired    Comment: Pharmacist, hospital, kindergarten  Tobacco Use  . Smoking status: Never Smoker  . Smokeless tobacco: Never Used  Substance and Sexual Activity  . Alcohol use: Never    Alcohol/week: 0.0 standard  drinks  . Drug use: Never  . Sexual activity: Not Currently    Birth control/protection: None    Comment: lives alone, avoids daiiry and gluten. 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 Not on file  Social History Narrative  . Not on file   Social Determinants of Health   Financial Resource Strain:   . Difficulty of Paying Living Expenses: Not on file  Food Insecurity:   . Worried About Charity fundraiser in the Last Year: Not on file  . Ran Out of Food in the Last Year: Not on file  Transportation Needs:   . Lack of Transportation (Medical): Not on file  . Lack of Transportation (Non-Medical): Not on file  Physical Activity:   . Days of Exercise per Week: Not on file  . Minutes of Exercise per Session: Not on file  Stress:   . Feeling of Stress : Not on file  Social Connections:   . Frequency of Communication with Friends and Family: Not on file  . Frequency of Social Gatherings with Friends and Family: Not on file  . Attends Religious Services: Not on file  . Active Member of Clubs or Organizations: Not on file  . Attends Archivist Meetings: Not on file  . Marital Status: Not on file  Intimate Partner Violence:   . Fear of Current or Ex-Partner: Not on file  . Emotionally Abused: Not on file  . Physically Abused: Not on file  . Sexually Abused: Not on file    Outpatient Medications Prior to Visit  Medication Sig Dispense Refill  . albuterol (VENTOLIN HFA) 108 (90 Base) MCG/ACT inhaler Inhale 2 puffs into the lungs every 6 (six) hours as needed for wheezing or shortness of breath. 18 g 2  . ASPIRIN 81 PO 81 mg 2 (two) times daily.    Marland Kitchen atorvastatin (LIPITOR) 20 MG tablet Take 1 tablet (20 mg total) by mouth daily. 90 tablet 3  . citalopram (CELEXA) 20 MG tablet Take 1 tablet (20 mg total) by mouth at bedtime. 90 tablet 1  . ferrous sulfate (FERROUSUL) 325 (65 FE) MG tablet Take 1 tablet (325 mg total) by mouth 3 (three) times daily with meals for 14  days. 42 tablet 0  . fluticasone (FLOVENT HFA) 110 MCG/ACT inhaler Inhale 1 puff into the lungs 2 (two) times daily. (Patient taking differently: Inhale 1 puff into the lungs 2 (two) times daily as needed (shortness of breath or wheezing). ) 1 Inhaler 12  . furosemide (LASIX) 40 MG tablet Take 1 tablet (40 mg total) by mouth daily. 90 tablet 1  . levothyroxine (SYNTHROID) 50 MCG tablet Take 1 tablet by mouth once daily 90 tablet  0  . lisinopril (ZESTRIL) 5 MG tablet Take 1 tablet (5 mg total) by mouth 2 (two) times daily. 60 tablet 2  . metoprolol succinate (TOPROL-XL) 25 MG 24 hr tablet Take 1 tablet (25 mg total) by mouth daily. (Patient taking differently: Take 25 mg by mouth at bedtime. ) 30 tablet 11  . omega-3 acid ethyl esters (LOVAZA) 1 g capsule Take 2 g by mouth daily.    Marland Kitchen omeprazole (PRILOSEC) 40 MG capsule Take 1 capsule (40 mg total) by mouth daily. 30 capsule 4  . ondansetron (ZOFRAN) 4 MG tablet Take 1 tablet (4 mg total) by mouth every 8 (eight) hours as needed for nausea or vomiting. 30 tablet 1  . potassium chloride (KLOR-CON) 10 MEQ tablet Take 1 tablet (10 mEq total) by mouth daily. 90 tablet 1  . pramipexole (MIRAPEX) 0.125 MG tablet Take 1 tablet (0.125 mg total) by mouth 2 (two) times daily. 60 tablet 1  . primidone (MYSOLINE) 50 MG tablet TAKE 1 TABLET BY MOUTH AT BEDTIME 90 tablet 0  . sucralfate (CARAFATE) 1 g tablet Take 1 tablet (1 g total) by mouth 2 (two) times daily. 60 tablet 1  . sucralfate (CARAFATE) 1 GM/10ML suspension Take 10 mLs (1 g total) by mouth 2 (two) times daily. 420 mL 0   No facility-administered medications prior to visit.    No Known Allergies  Review of Systems  Constitutional: Positive for malaise/fatigue. Negative for chills, fever and weight loss.  HENT: Negative for congestion.   Eyes: Negative for blurred vision.  Respiratory: Negative for shortness of breath.   Cardiovascular: Negative for chest pain, palpitations and leg swelling.    Gastrointestinal: Positive for abdominal pain, blood in stool, melena and nausea. Negative for constipation and diarrhea.  Genitourinary: Negative for dysuria and frequency.  Musculoskeletal: Negative for falls.  Skin: Negative for rash.  Neurological: Negative for dizziness, loss of consciousness and headaches.  Endo/Heme/Allergies: Negative for environmental allergies.  Psychiatric/Behavioral: Negative for depression. The patient is not nervous/anxious.        Objective:    Physical Exam unable to obtain via phone  BP 117/60 (BP Location: Left Arm, Patient Position: Sitting, Cuff Size: Normal)   Pulse 65   Wt 124 lb (56.2 kg)   SpO2 94%   BMI 25.92 kg/m  Wt Readings from Last 3 Encounters:  06/01/19 124 lb (56.2 kg)  05/28/19 124 lb (56.2 kg)  05/03/19 127 lb 12.8 oz (58 kg)    Diabetic Foot Exam - Simple   No data filed     Lab Results  Component Value Date   WBC 5.6 05/03/2019   HGB 11.9 (L) 05/03/2019   HCT 35.8 (L) 05/03/2019   PLT 232.0 05/03/2019   GLUCOSE 78 05/03/2019   CHOL 165 05/03/2019   TRIG 131.0 05/03/2019   HDL 72.70 05/03/2019   LDLCALC 66 05/03/2019   ALT 17 05/03/2019   AST 23 05/03/2019   NA 135 05/03/2019   K 4.2 05/03/2019   CL 99 05/03/2019   CREATININE 1.13 05/03/2019   BUN 21 05/03/2019   CO2 26 05/03/2019   TSH 1.56 05/03/2019   INR 1.2 02/16/2019   HGBA1C 5.9 05/03/2019    Lab Results  Component Value Date   TSH 1.56 05/03/2019   Lab Results  Component Value Date   WBC 5.6 05/03/2019   HGB 11.9 (L) 05/03/2019   HCT 35.8 (L) 05/03/2019   MCV 89.6 05/03/2019   PLT 232.0 05/03/2019  Lab Results  Component Value Date   NA 135 05/03/2019   K 4.2 05/03/2019   CO2 26 05/03/2019   GLUCOSE 78 05/03/2019   BUN 21 05/03/2019   CREATININE 1.13 05/03/2019   BILITOT 0.5 05/03/2019   ALKPHOS 88 05/03/2019   AST 23 05/03/2019   ALT 17 05/03/2019   PROT 7.2 05/03/2019   ALBUMIN 4.4 05/03/2019   CALCIUM 9.2 05/03/2019    ANIONGAP 11 02/24/2019   GFR 46.64 (L) 05/03/2019   Lab Results  Component Value Date   CHOL 165 05/03/2019   Lab Results  Component Value Date   HDL 72.70 05/03/2019   Lab Results  Component Value Date   LDLCALC 66 05/03/2019   Lab Results  Component Value Date   TRIG 131.0 05/03/2019   Lab Results  Component Value Date   CHOLHDL 2 05/03/2019   Lab Results  Component Value Date   HGBA1C 5.9 05/03/2019       Assessment & Plan:   Problem List Items Addressed This Visit    Hyperlipidemia   Diarrhea    Only 1 episode of diarrhea yesterday.      Vitamin D deficiency    Supplement and monitor      Hyperglycemia    hgba1c acceptable, minimize simple carbs. Increase exercise as tolerated.       Abdominal pain   Relevant Orders   CT Abdomen Pelvis W Contrast   CBC with Differential/Platelet   CMP   Iron, TIBC and Ferritin Panel   History of rectal bleeding    She had an episode of rectal bleeding with blood in the bowl about 4 days ago. Then a couple episodes of black stool after that. These have stopped but she is advised if they return she needs to seek care. For now she will monitor symptoms and she develops weakness, syncope or other concerns also seek care. Will arrange for cbc and iron studies as well as iFOB testing.       LUQ pain    She has been having pain for 4 days. Slowly improving and start after she ate some yogurt. She had an episode of rectal bleeding that day and then some black stool but that has resolved. The LUQ pain waxes and wanes and is worse after she eats. She will maintain a bland diet and she has already restarted Sucralfate she will continue that and omeprazole and we have added back famotidine. Have ordered CT abdomen and labs and she will seek care if she worsens again or new symptoms develop.          I am having Tranell Tuell. Balogh start on famotidine. I am also having her maintain her metoprolol succinate, fluticasone, omega-3 acid  ethyl esters, atorvastatin, ferrous sulfate, ondansetron, albuterol, lisinopril, furosemide, citalopram, omeprazole, ASPIRIN 81 PO, potassium chloride, levothyroxine, pramipexole, primidone, and sucralfate.  Meds ordered this encounter  Medications  . famotidine (PEPCID) 40 MG tablet    Sig: Take 1 tablet (40 mg total) by mouth daily.    Dispense:  30 tablet    Refill:  3     I discussed the assessment and treatment plan with the patient. The patient was provided an opportunity to ask questions and all were answered. The patient agreed with the plan and demonstrated an understanding of the instructions.   The patient was advised to call back or seek an in-person evaluation if the symptoms worsen or if the condition fails to improve as anticipated.  I  provided 25 minutes of non-face-to-face time during this encounter.   Penni Homans, MD

## 2019-06-01 NOTE — Assessment & Plan Note (Signed)
Only 1 episode of diarrhea yesterday.

## 2019-06-01 NOTE — Assessment & Plan Note (Signed)
hgba1c acceptable, minimize simple carbs. Increase exercise as tolerated.  

## 2019-06-01 NOTE — Assessment & Plan Note (Signed)
She has been having pain for 4 days. Slowly improving and start after she ate some yogurt. She had an episode of rectal bleeding that day and then some black stool but that has resolved. The LUQ pain waxes and wanes and is worse after she eats. She will maintain a bland diet and she has already restarted Sucralfate she will continue that and omeprazole and we have added back famotidine. Have ordered CT abdomen and labs and she will seek care if she worsens again or new symptoms develop.

## 2019-06-04 ENCOUNTER — Other Ambulatory Visit: Payer: Self-pay

## 2019-06-04 ENCOUNTER — Other Ambulatory Visit (INDEPENDENT_AMBULATORY_CARE_PROVIDER_SITE_OTHER): Payer: Medicare HMO

## 2019-06-04 DIAGNOSIS — D649 Anemia, unspecified: Secondary | ICD-10-CM | POA: Diagnosis not present

## 2019-06-04 DIAGNOSIS — R1012 Left upper quadrant pain: Secondary | ICD-10-CM | POA: Diagnosis not present

## 2019-06-04 DIAGNOSIS — Z8719 Personal history of other diseases of the digestive system: Secondary | ICD-10-CM | POA: Diagnosis not present

## 2019-06-04 LAB — CBC WITH DIFFERENTIAL/PLATELET
Basophils Absolute: 0 10*3/uL (ref 0.0–0.1)
Basophils Relative: 0.5 % (ref 0.0–3.0)
Eosinophils Absolute: 0 10*3/uL (ref 0.0–0.7)
Eosinophils Relative: 0 % (ref 0.0–5.0)
HCT: 34.8 % — ABNORMAL LOW (ref 36.0–46.0)
Hemoglobin: 11.7 g/dL — ABNORMAL LOW (ref 12.0–15.0)
Lymphocytes Relative: 29.4 % (ref 12.0–46.0)
Lymphs Abs: 1.5 10*3/uL (ref 0.7–4.0)
MCHC: 33.6 g/dL (ref 30.0–36.0)
MCV: 88.9 fl (ref 78.0–100.0)
Monocytes Absolute: 0.6 10*3/uL (ref 0.1–1.0)
Monocytes Relative: 12.1 % — ABNORMAL HIGH (ref 3.0–12.0)
Neutro Abs: 3 10*3/uL (ref 1.4–7.7)
Neutrophils Relative %: 58 % (ref 43.0–77.0)
Platelets: 219 10*3/uL (ref 150.0–400.0)
RBC: 3.91 Mil/uL (ref 3.87–5.11)
RDW: 14.5 % (ref 11.5–15.5)
WBC: 5.2 10*3/uL (ref 4.0–10.5)

## 2019-06-04 LAB — COMPREHENSIVE METABOLIC PANEL
ALT: 15 U/L (ref 0–35)
AST: 22 U/L (ref 0–37)
Albumin: 4.2 g/dL (ref 3.5–5.2)
Alkaline Phosphatase: 85 U/L (ref 39–117)
BUN: 26 mg/dL — ABNORMAL HIGH (ref 6–23)
CO2: 27 mEq/L (ref 19–32)
Calcium: 9 mg/dL (ref 8.4–10.5)
Chloride: 99 mEq/L (ref 96–112)
Creatinine, Ser: 1.26 mg/dL — ABNORMAL HIGH (ref 0.40–1.20)
GFR: 41.12 mL/min — ABNORMAL LOW (ref >60.00)
Glucose, Bld: 109 mg/dL — ABNORMAL HIGH (ref 70–99)
Potassium: 4.1 mEq/L (ref 3.5–5.1)
Sodium: 135 mEq/L (ref 135–145)
Total Bilirubin: 0.5 mg/dL (ref 0.2–1.2)
Total Protein: 6.9 g/dL (ref 6.0–8.3)

## 2019-06-05 LAB — IRON,TIBC AND FERRITIN PANEL
%SAT: 18 % (calc) (ref 16–45)
Ferritin: 52 ng/mL (ref 16–288)
Iron: 58 ug/dL (ref 45–160)
TIBC: 321 mcg/dL (calc) (ref 250–450)

## 2019-06-06 ENCOUNTER — Encounter (HOSPITAL_BASED_OUTPATIENT_CLINIC_OR_DEPARTMENT_OTHER): Payer: Self-pay

## 2019-06-06 ENCOUNTER — Ambulatory Visit (HOSPITAL_BASED_OUTPATIENT_CLINIC_OR_DEPARTMENT_OTHER)
Admission: RE | Admit: 2019-06-06 | Discharge: 2019-06-06 | Disposition: A | Discharge status: Home / Self Care | Payer: Medicare HMO | Source: Ambulatory Visit | Attending: Family Medicine | Admitting: Family Medicine

## 2019-06-06 ENCOUNTER — Other Ambulatory Visit: Payer: Self-pay

## 2019-06-06 DIAGNOSIS — R109 Unspecified abdominal pain: Secondary | ICD-10-CM | POA: Diagnosis not present

## 2019-06-06 DIAGNOSIS — R1012 Left upper quadrant pain: Secondary | ICD-10-CM

## 2019-06-06 MED ORDER — IOHEXOL 300 MG/ML  SOLN
100.0000 mL | Freq: Once | INTRAMUSCULAR | Status: AC | PRN
  Administered 2019-06-06: 80 mL via INTRAVENOUS

## 2019-06-08 ENCOUNTER — Other Ambulatory Visit (INDEPENDENT_AMBULATORY_CARE_PROVIDER_SITE_OTHER): Payer: Medicare HMO

## 2019-06-08 DIAGNOSIS — D649 Anemia, unspecified: Secondary | ICD-10-CM | POA: Diagnosis not present

## 2019-06-08 LAB — FECAL OCCULT BLOOD, IMMUNOCHEMICAL: Fecal Occult Bld: NEGATIVE

## 2019-06-09 ENCOUNTER — Other Ambulatory Visit: Payer: Self-pay | Admitting: Family Medicine

## 2019-06-09 DIAGNOSIS — R112 Nausea with vomiting, unspecified: Secondary | ICD-10-CM

## 2019-06-09 DIAGNOSIS — R198 Other specified symptoms and signs involving the digestive system and abdomen: Secondary | ICD-10-CM

## 2019-06-09 DIAGNOSIS — R109 Unspecified abdominal pain: Secondary | ICD-10-CM

## 2019-06-09 DIAGNOSIS — K589 Irritable bowel syndrome without diarrhea: Secondary | ICD-10-CM

## 2019-06-09 DIAGNOSIS — Z8619 Personal history of other infectious and parasitic diseases: Secondary | ICD-10-CM

## 2019-06-09 DIAGNOSIS — R194 Change in bowel habit: Secondary | ICD-10-CM

## 2019-06-11 DIAGNOSIS — Z96642 Presence of left artificial hip joint: Secondary | ICD-10-CM | POA: Diagnosis not present

## 2019-06-11 DIAGNOSIS — Z471 Aftercare following joint replacement surgery: Secondary | ICD-10-CM | POA: Diagnosis not present

## 2019-06-15 ENCOUNTER — Ambulatory Visit (INDEPENDENT_AMBULATORY_CARE_PROVIDER_SITE_OTHER): Payer: Medicare HMO | Admitting: Family Medicine

## 2019-06-15 ENCOUNTER — Other Ambulatory Visit: Payer: Self-pay

## 2019-06-15 DIAGNOSIS — E039 Hypothyroidism, unspecified: Secondary | ICD-10-CM | POA: Diagnosis not present

## 2019-06-15 DIAGNOSIS — R630 Anorexia: Secondary | ICD-10-CM

## 2019-06-15 DIAGNOSIS — R109 Unspecified abdominal pain: Secondary | ICD-10-CM

## 2019-06-15 DIAGNOSIS — I10 Essential (primary) hypertension: Secondary | ICD-10-CM | POA: Diagnosis not present

## 2019-06-15 DIAGNOSIS — R197 Diarrhea, unspecified: Secondary | ICD-10-CM

## 2019-06-15 DIAGNOSIS — E559 Vitamin D deficiency, unspecified: Secondary | ICD-10-CM | POA: Diagnosis not present

## 2019-06-15 DIAGNOSIS — R739 Hyperglycemia, unspecified: Secondary | ICD-10-CM | POA: Diagnosis not present

## 2019-06-15 MED ORDER — HYOSCYAMINE SULFATE 0.125 MG SL SUBL: 0.1250 mg | SUBLINGUAL_TABLET | Freq: Four times a day (QID) | SUBLINGUAL | 0 refills | Status: DC | PRN

## 2019-06-15 NOTE — Assessment & Plan Note (Signed)
Improving with increased protein intake and able to eat veg and simple carbs more so.

## 2019-06-15 NOTE — Assessment & Plan Note (Signed)
hgba1c acceptable, minimize simple carbs. Increase exercise as tolerated.  

## 2019-06-15 NOTE — Assessment & Plan Note (Signed)
Well controlled, no changes to meds. Encouraged heart healthy diet such as the DASH diet and exercise as tolerated.  °

## 2019-06-15 NOTE — Assessment & Plan Note (Signed)
Greatly improved with her recent treatment. No ongoing pain.

## 2019-06-15 NOTE — Assessment & Plan Note (Signed)
Is doing much better in this regard. She notes she is having some lower pelvic pain at times and is still worried about that. She is given a refill for Hyoscyamine to try prn and she will share this with GI when she sees them soon.

## 2019-06-15 NOTE — Assessment & Plan Note (Signed)
Supplement and monitor 

## 2019-06-15 NOTE — Progress Notes (Signed)
Virtual Visit via phone Note  I connected with Julie Wilkerson on 06/15/19 at  9:20 AM EST by a phone enabled telemedicine application and verified that I am speaking with the correct person using two identifiers.  Location: Patient: home Provider: home   I discussed the limitations of evaluation and management by telemedicine and the availability of in person appointments. The patient expressed understanding and agreed to proceed. [name], CMA was able to get the patient set upon a visit, phone   Subjective:    Patient ID: Julie Wilkerson, female    DOB: 24-Mar-1941, 79 y.o.   MRN: [id number]  No chief complaint on file.   HPI Patient is in today for follow up. She is feeling much better. Her upper abdominal pain is much better since her imaging with contrast. She does still note some pelvic discomfort at times. Her Diarrhea is improved as well. No bloody or tarry stool. Her appetite is improved and she is getting more protein and hydrating well. She is using Ensure drinks and Nutraclear powder. She notes no fevers or chills. Denies CP/palp/SOB/HA/congestion/fevers or GU c/o. Taking meds as prescribed. She has her Covid 19 vaccine scheduled for next Wednesday.   Past Medical History:  Diagnosis Date  . Anemia   . Anginal pain ([place])   . Arthritis   . Asthma   . Breast cancer ([place])    2002, left, encapsulated, microcalcifications. lumpectomy, radtiation x 30  . Breast cancer in female [facility])   . Change in mole 06/08/2016  . Colitis   . Complication of anesthesia    VERY SENSITIVE TO ANESTHESIA   . Decreased visual acuity 01/16/2017  . Dyspnea 11/04/2016   with asthma exacerbation only  . Dysuria 11/04/2016  . Gluten intolerance   . History of chicken pox   . History of Helicobacter pylori infection 08/01/2015  . Hyperlipidemia   . Hypertension   . Hypothyroidism   . IBS (irritable bowel syndrome)   . Left leg pain 03/01/2017  . Low back pain 08/10/2015  . 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 Neck pain 03/01/2017  . Osteoporosis   . Personal history of radiation therapy   . Pneumonia   . PONV (postoperative nausea and vomiting)   . RLQ discomfort 03/01/2017  . Stomach cramps   . Urinary tract infection 11/04/2016    Past Surgical History:  Procedure Laterality Date  . ABDOMINAL HYSTERECTOMY     partial  . APPENDECTOMY    . BREAST LUMPECTOMY Left 2002  . CHOLECYSTECTOMY  Age 14 or 34  . COLONOSCOPY  2017  . EYE SURGERY Left    cataract  . LEFT HEART CATH AND CORONARY ANGIOGRAPHY N/A 02/09/2019   Procedure: LEFT HEART CATH AND CORONARY ANGIOGRAPHY;  Surgeon: Nelva Bush, MD;  Location: [facility];  Service: Cardiovascular;  Laterality: N/A;  . TONSILLECTOMY    . 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 Colitis Mother   . Irritable bowel syndrome Mother   . Hypertension Father   . Hyperlipidemia Brother   . Heart attack Brother   . Stomach cancer Paternal Grandmother 99  . Hyperlipidemia Brother   . Heart attack Brother   . Hyperlipidemia Brother   . Heart attack Brother   .  Hyperlipidemia Sister   . Leukemia Daughter   . Arthritis Daughter   . Heart disease Daughter        ASD vs VSD  . Colon cancer Neg Hx     Social History   Socioeconomic History  . Marital status: Single    Spouse name: Not on file  . Number of children: 3  . Years of education: 31  . Highest education level: Master's degree (e.g., MA, MS, MEng, MEd, MSW, MBA)  Occupational History  . Occupation: retired    Comment: Pharmacist, hospital, kindergarten  Tobacco Use  . Smoking status: Never Smoker  . Smokeless tobacco: Never Used  Substance and Sexual Activity  . Alcohol use: Never    Alcohol/week: 0.0 standard drinks  . Drug  use: Never  . Sexual activity: Not Currently    Birth control/protection: None    Comment: lives alone, avoids daiiry and gluten. 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 Not on file  Social History Narrative  . Not on file   Social Determinants of Health   Financial Resource Strain:   . Difficulty of Paying Living Expenses: Not on file  Food Insecurity:   . Worried About Charity fundraiser in the Last Year: Not on file  . Ran Out of Food in the Last Year: Not on file  Transportation Needs:   . Lack of Transportation (Medical): Not on file  . Lack of Transportation (Non-Medical): Not on file  Physical Activity:   . Days of Exercise per Week: Not on file  . Minutes of Exercise per Session: Not on file  Stress:   . Feeling of Stress : Not on file  Social Connections:   . Frequency of Communication with Friends and Family: Not on file  . Frequency of Social Gatherings with Friends and Family: Not on file  . Attends Religious Services: Not on file  . Active Member of Clubs or Organizations: Not on file  . Attends Archivist Meetings: Not on file  . Marital Status: Not on file  Intimate Partner Violence:   . Fear of Current or Ex-Partner: Not on file  . Emotionally Abused: Not on file  . Physically Abused: Not on file  . Sexually Abused: Not on file    Outpatient Medications Prior to Visit  Medication Sig Dispense Refill  . albuterol (VENTOLIN HFA) 108 (90 Base) MCG/ACT inhaler Inhale 2 puffs into the lungs every 6 (six) hours as needed for wheezing or shortness of breath. 18 g 2  . ASPIRIN 81 PO 81 mg 2 (two) times daily.    Marland Kitchen atorvastatin (LIPITOR) 20 MG tablet Take 1 tablet (20 mg total) by mouth daily. 90 tablet 3  . citalopram (CELEXA) 20 MG tablet Take 1 tablet (20 mg total) by mouth at bedtime. 90 tablet 1  . famotidine (PEPCID) 40 MG tablet Take 1 tablet (40 mg total) by mouth daily. 30 tablet 3  . ferrous sulfate (FERROUSUL) 325 (65 FE) MG tablet  Take 1 tablet (325 mg total) by mouth 3 (three) times daily with meals for 14 days. 42 tablet 0  . fluticasone (FLOVENT HFA) 110 MCG/ACT inhaler Inhale 1 puff into the lungs 2 (two) times daily. (Patient taking differently: Inhale 1 puff into the lungs 2 (two) times daily as needed (shortness of breath or wheezing). ) 1 Inhaler 12  . furosemide (LASIX) 40 MG tablet Take 1 tablet (40 mg total) by mouth daily. 90 tablet 1  . levothyroxine (  SYNTHROID) 50 MCG tablet Take 1 tablet by mouth once daily 90 tablet 0  . lisinopril (ZESTRIL) 5 MG tablet Take 1 tablet (5 mg total) by mouth 2 (two) times daily. 60 tablet 2  . metoprolol succinate (TOPROL-XL) 25 MG 24 hr tablet Take 1 tablet (25 mg total) by mouth daily. (Patient taking differently: Take 25 mg by mouth at bedtime. ) 30 tablet 11  . omega-3 acid ethyl esters (LOVAZA) 1 g capsule Take 2 g by mouth daily.    Marland Kitchen omeprazole (PRILOSEC) 40 MG capsule Take 1 capsule (40 mg total) by mouth daily. 30 capsule 4  . ondansetron (ZOFRAN) 4 MG tablet Take 1 tablet (4 mg total) by mouth every 8 (eight) hours as needed for nausea or vomiting. 30 tablet 1  . potassium chloride (KLOR-CON) 10 MEQ tablet Take 1 tablet (10 mEq total) by mouth daily. 90 tablet 1  . pramipexole (MIRAPEX) 0.125 MG tablet Take 1 tablet (0.125 mg total) by mouth 2 (two) times daily. 60 tablet 1  . primidone (MYSOLINE) 50 MG tablet TAKE 1 TABLET BY MOUTH AT BEDTIME 90 tablet 0  . sucralfate (CARAFATE) 1 g tablet Take 1 tablet (1 g total) by mouth 2 (two) times daily. 60 tablet 1   No facility-administered medications prior to visit.    No Known Allergies  Review of Systems  Constitutional: Positive for malaise/fatigue. Negative for fever.  HENT: Negative for congestion.   Eyes: Negative for blurred vision.  Respiratory: Negative for shortness of breath.   Cardiovascular: Negative for chest pain, palpitations and leg swelling.  Gastrointestinal: Positive for abdominal pain and nausea.  Negative for blood in stool and vomiting.  Genitourinary: Negative for dysuria and frequency.  Musculoskeletal: Negative for falls.  Skin: Negative for rash.  Neurological: Negative for dizziness, loss of consciousness and headaches.  Endo/Heme/Allergies: Negative for environmental allergies.  Psychiatric/Behavioral: Negative for depression. The patient is not nervous/anxious.        Objective:    Physical Exam unable to obtain via phone  BP 124/67   Pulse 60  Wt Readings from Last 3 Encounters:  06/01/19 124 lb (56.2 kg)  05/28/19 124 lb (56.2 kg)  05/03/19 127 lb 12.8 oz (58 kg)    Diabetic Foot Exam - Simple   No data filed     Lab Results  Component Value Date   WBC 5.2 06/04/2019   HGB 11.7 (L) 06/04/2019   HCT 34.8 (L) 06/04/2019   PLT 219.0 06/04/2019   GLUCOSE 109 (H) 06/04/2019   CHOL 165 05/03/2019   TRIG 131.0 05/03/2019   HDL 72.70 05/03/2019   LDLCALC 66 05/03/2019   ALT 15 06/04/2019   AST 22 06/04/2019   NA 135 06/04/2019   K 4.1 06/04/2019   CL 99 06/04/2019   CREATININE 1.26 (H) 06/04/2019   BUN 26 (H) 06/04/2019   CO2 27 06/04/2019   TSH 1.56 05/03/2019   INR 1.2 02/16/2019   HGBA1C 5.9 05/03/2019    Lab Results  Component Value Date   TSH 1.56 05/03/2019   Lab Results  Component Value Date   WBC 5.2 06/04/2019   HGB 11.7 (L) 06/04/2019   HCT 34.8 (L) 06/04/2019   MCV 88.9 06/04/2019   PLT 219.0 06/04/2019   Lab Results  Component Value Date   NA 135 06/04/2019   K 4.1 06/04/2019   CO2 27 06/04/2019   GLUCOSE 109 (H) 06/04/2019   BUN 26 (H) 06/04/2019   CREATININE 1.26 (H) 06/04/2019  BILITOT 0.5 06/04/2019   ALKPHOS 85 06/04/2019   AST 22 06/04/2019   ALT 15 06/04/2019   PROT 6.9 06/04/2019   ALBUMIN 4.2 06/04/2019   CALCIUM 9.0 06/04/2019   ANIONGAP 11 02/24/2019   GFR 41.12 (L) 06/04/2019   Lab Results  Component Value Date   CHOL 165 05/03/2019   Lab Results  Component Value Date   HDL 72.70 05/03/2019    Lab Results  Component Value Date   LDLCALC 66 05/03/2019   Lab Results  Component Value Date   TRIG 131.0 05/03/2019   Lab Results  Component Value Date   CHOLHDL 2 05/03/2019   Lab Results  Component Value Date   HGBA1C 5.9 05/03/2019       Assessment & Plan:   Problem List Items Addressed This Visit    Essential hypertension (Chronic)    Well controlled, no changes to meds. Encouraged heart healthy diet such as the DASH diet and exercise as tolerated.       Hypothyroid    On Levothyroxine, continue to monitor      Diarrhea    Is doing much better in this regard. She notes she is having some lower pelvic pain at times and is still worried about that. She is given a refill for Hyoscyamine to try prn and she will share this with GI when she sees them soon.      Vitamin D deficiency    Supplement and monitor      Hyperglycemia    hgba1c acceptable, minimize simple carbs. Increase exercise as tolerated.      Abdominal pain    Greatly improved with her recent treatment. No ongoing pain.       Poor appetite    Improving with increased protein intake and able to eat veg and simple carbs more so.         I am having Kinsly Matassa. Almendarez start on hyoscyamine. I am also having her maintain her metoprolol succinate, fluticasone, omega-3 acid ethyl esters, atorvastatin, ferrous sulfate, ondansetron, albuterol, lisinopril, furosemide, citalopram, omeprazole, ASPIRIN 81 PO, potassium chloride, levothyroxine, pramipexole, primidone, sucralfate, and famotidine.  Meds ordered this encounter  Medications  . hyoscyamine (LEVSIN SL) 0.125 MG SL tablet    Sig: Place 1 tablet (0.125 mg total) under the tongue every 6 (six) hours as needed.    Dispense:  30 tablet    Refill:  0     I discussed the assessment and treatment plan with the patient. The patient was provided an opportunity to ask questions and all were answered. The patient agreed with the plan and demonstrated an  understanding of the instructions.   The patient was advised to call back or seek an in-person evaluation if the symptoms worsen or if the condition fails to improve as anticipated.  I provided 25 minutes of non-face-to-face time during this encounter.   Penni Homans, MD

## 2019-06-15 NOTE — Assessment & Plan Note (Signed)
On Levothyroxine, continue to monitor 

## 2019-06-19 DIAGNOSIS — J45909 Unspecified asthma, uncomplicated: Secondary | ICD-10-CM | POA: Diagnosis not present

## 2019-06-19 DIAGNOSIS — R69 Illness, unspecified: Secondary | ICD-10-CM | POA: Diagnosis not present

## 2019-06-19 DIAGNOSIS — K219 Gastro-esophageal reflux disease without esophagitis: Secondary | ICD-10-CM | POA: Diagnosis not present

## 2019-06-19 DIAGNOSIS — G3184 Mild cognitive impairment, so stated: Secondary | ICD-10-CM | POA: Diagnosis not present

## 2019-06-19 DIAGNOSIS — I509 Heart failure, unspecified: Secondary | ICD-10-CM | POA: Diagnosis not present

## 2019-06-19 DIAGNOSIS — E785 Hyperlipidemia, unspecified: Secondary | ICD-10-CM | POA: Diagnosis not present

## 2019-06-19 DIAGNOSIS — E261 Secondary hyperaldosteronism: Secondary | ICD-10-CM | POA: Diagnosis not present

## 2019-06-19 DIAGNOSIS — E039 Hypothyroidism, unspecified: Secondary | ICD-10-CM | POA: Diagnosis not present

## 2019-06-19 DIAGNOSIS — K589 Irritable bowel syndrome without diarrhea: Secondary | ICD-10-CM | POA: Diagnosis not present

## 2019-06-19 DIAGNOSIS — I110 Hypertensive heart disease with heart failure: Secondary | ICD-10-CM | POA: Diagnosis not present

## 2019-06-19 DIAGNOSIS — Z008 Encounter for other general examination: Secondary | ICD-10-CM | POA: Diagnosis not present

## 2019-06-30 ENCOUNTER — Encounter: Payer: Self-pay | Admitting: Family Medicine

## 2019-07-02 NOTE — Telephone Encounter (Signed)
Please schedule patient a vv with pcp she may want in person either or is fine  Please advise

## 2019-07-07 ENCOUNTER — Telehealth: Payer: Medicare HMO | Admitting: Nurse Practitioner

## 2019-07-07 DIAGNOSIS — R300 Dysuria: Secondary | ICD-10-CM

## 2019-07-07 MED ORDER — CEPHALEXIN 500 MG PO CAPS: 500.0000 mg | ORAL_CAPSULE | Freq: Two times a day (BID) | ORAL | 0 refills | Status: DC

## 2019-07-07 NOTE — Progress Notes (Signed)

## 2019-07-12 ENCOUNTER — Ambulatory Visit: Payer: Medicare HMO | Admitting: *Deleted

## 2019-07-12 ENCOUNTER — Ambulatory Visit: Payer: Medicare HMO | Admitting: Gastroenterology

## 2019-07-20 ENCOUNTER — Other Ambulatory Visit: Payer: Self-pay

## 2019-07-20 ENCOUNTER — Ambulatory Visit (INDEPENDENT_AMBULATORY_CARE_PROVIDER_SITE_OTHER): Payer: Medicare HMO | Admitting: Family Medicine

## 2019-07-20 DIAGNOSIS — E039 Hypothyroidism, unspecified: Secondary | ICD-10-CM

## 2019-07-20 DIAGNOSIS — N390 Urinary tract infection, site not specified: Secondary | ICD-10-CM

## 2019-07-20 DIAGNOSIS — R739 Hyperglycemia, unspecified: Secondary | ICD-10-CM

## 2019-07-20 DIAGNOSIS — I10 Essential (primary) hypertension: Secondary | ICD-10-CM

## 2019-07-20 NOTE — Patient Instructions (Signed)
Encouraged increased hydration and fiber in diet. Daily probiotics. If bowels not moving can use MOM 2 tbls po in 4 oz of warm prune juice by mouth every 2-3 days. If no results then repeat in 4 hours with  Dulcolax suppository pr, may repeat again in 4 more hours as needed. Seek care if symptoms worsen. Consider daily Miralax and/or Dulcolax if symptoms persist.   If you keep having trouble start MIralax and Benefiber together every day.  Omron Blood Pressure cuff, upper arm, want BP 100-140/60-90 Pulse oximeter, want oxygen in 90s  Weekly vitals  Take Multivitamin with minerals, selenium Vitamin D 1000-2000 IU daily Probiotic with lactobacillus and bifidophilus Asprin EC 81 mg daily  Melatonin 2-5 mg at bedtime  [email or web address]

## 2019-07-22 NOTE — Progress Notes (Signed)
Virtual Visit via phone Note  I connected with Julie Wilkerson on 07/20/19 at  9:40 AM EST by a phone enabled telemedicine application and verified that I am speaking with the correct person using two identifiers.  Location: Patient: home Provider: home   I discussed the limitations of evaluation and management by telemedicine and the availability of in person appointments. The patient expressed understanding and agreed to proceed. [name], CMA was able to set the patient up on a phone visit after being unable to set up a video visit.   Subjective:    Patient ID: Julie Wilkerson, female    DOB: January 19, 1941, 79 y.o.   MRN: [id number]  No chief complaint on file.   HPI Patient is in today for follow up on chronic medical concerns. She had a recent urinary tract infection that was treated with keflex. She notes her urinary symptoms including frequency have resolved but she had notable GI upset on it. She had diarrhea, abdominal discomfort and nausea. No bloody or tarry stool. She is doing much better now than she was but still notes some nausea. She is eating some better. Denies CP/palp/SOB/HA/congestion/fevers. Taking meds as prescribed  Past Medical History:  Diagnosis Date  . Anemia   . Anginal pain ([place])   . Arthritis   . Asthma   . Breast cancer ([place])    2002, left, encapsulated, microcalcifications. lumpectomy, radtiation x 30  . Breast cancer in female [facility])   . Change in mole 06/08/2016  . Colitis   . Complication of anesthesia    VERY SENSITIVE TO ANESTHESIA   . Decreased visual acuity 01/16/2017  . Dyspnea 11/04/2016   with asthma exacerbation only  . Dysuria 11/04/2016  . Gluten intolerance   . History of chicken pox   . History of Helicobacter pylori infection 08/01/2015  . Hyperlipidemia   . Hypertension   . Hypothyroidism   . IBS (irritable bowel syndrome)   . Left leg pain 03/01/2017  . Low back pain 08/10/2015  . Mild vascular neurocognitive disorder  ([place]) 04/13/2019  . Neck pain 03/01/2017  . Osteoporosis   . Personal history of radiation therapy   . Pneumonia   . PONV (postoperative nausea and vomiting)   . RLQ discomfort 03/01/2017  . Stomach cramps   . Urinary tract infection 11/04/2016    Past Surgical History:  Procedure Laterality Date  . ABDOMINAL HYSTERECTOMY     partial  . APPENDECTOMY    . BREAST LUMPECTOMY Left 2002  . CHOLECYSTECTOMY  Age 79 or 79  . COLONOSCOPY  2017  . EYE SURGERY Left    cataract  . LEFT HEART CATH AND CORONARY ANGIOGRAPHY N/A 02/09/2019   Procedure: LEFT HEART CATH AND CORONARY ANGIOGRAPHY;  Surgeon: Nelva Bush, MD;  Location: [place] [facility];  Service: Cardiovascular;  Laterality: N/A;  . TONSILLECTOMY    . 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 Colitis Mother   . Irritable bowel syndrome Mother   . Hypertension Father   . Hyperlipidemia Brother   . Heart attack Brother   . Stomach cancer Paternal Grandmother 31  . Hyperlipidemia Brother   . Heart attack Brother   . Hyperlipidemia Brother   . Heart attack Brother   . Hyperlipidemia Sister   .  Leukemia Daughter   . Arthritis Daughter   . Heart disease Daughter        ASD vs VSD  . Colon cancer Neg Hx     Social History   Socioeconomic History  . Marital status: Single    Spouse name: Not on file  . Number of children: 3  . Years of education: 31  . Highest education level: Master's degree (e.g., MA, MS, MEng, MEd, MSW, MBA)  Occupational History  . Occupation: retired    Comment: Pharmacist, hospital, kindergarten  Tobacco Use  . Smoking status: Never Smoker  . Smokeless tobacco: Never Used  Substance and Sexual Activity  . Alcohol use: Never    Alcohol/week: 0.0 standard drinks  . Drug use: Never  . Sexual activity: Not  Currently    Birth control/protection: None    Comment: lives alone, avoids daiiry and gluten. 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 Not on file  Social History Narrative  . Not on file   Social Determinants of Health   Financial Resource Strain:   . Difficulty of Paying Living Expenses: Not on file  Food Insecurity:   . Worried About Charity fundraiser in the Last Year: Not on file  . Ran Out of Food in the Last Year: Not on file  Transportation Needs:   . Lack of Transportation (Medical): Not on file  . Lack of Transportation (Non-Medical): Not on file  Physical Activity:   . Days of Exercise per Week: Not on file  . Minutes of Exercise per Session: Not on file  Stress:   . Feeling of Stress : Not on file  Social Connections:   . Frequency of Communication with Friends and Family: Not on file  . Frequency of Social Gatherings with Friends and Family: Not on file  . Attends Religious Services: Not on file  . Active Member of Clubs or Organizations: Not on file  . Attends Archivist Meetings: Not on file  . Marital Status: Not on file  Intimate Partner Violence:   . Fear of Current or Ex-Partner: Not on file  . Emotionally Abused: Not on file  . Physically Abused: Not on file  . Sexually Abused: Not on file    Outpatient Medications Prior to Visit  Medication Sig Dispense Refill  . albuterol (VENTOLIN HFA) 108 (90 Base) MCG/ACT inhaler Inhale 2 puffs into the lungs every 6 (six) hours as needed for wheezing or shortness of breath. 18 g 2  . ASPIRIN 81 PO 81 mg 2 (two) times daily.    Marland Kitchen atorvastatin (LIPITOR) 20 MG tablet Take 1 tablet (20 mg total) by mouth daily. 90 tablet 3  . citalopram (CELEXA) 20 MG tablet Take 1 tablet (20 mg total) by mouth at bedtime. 90 tablet 1  . famotidine (PEPCID) 40 MG tablet Take 1 tablet (40 mg total) by mouth daily. 30 tablet 3  . ferrous sulfate (FERROUSUL) 325 (65 FE) MG tablet Take 1 tablet (325 mg total) by mouth  3 (three) times daily with meals for 14 days. 42 tablet 0  . fluticasone (FLOVENT HFA) 110 MCG/ACT inhaler Inhale 1 puff into the lungs 2 (two) times daily. (Patient taking differently: Inhale 1 puff into the lungs 2 (two) times daily as needed (shortness of breath or wheezing). ) 1 Inhaler 12  . furosemide (LASIX) 40 MG tablet Take 1 tablet (40 mg total) by mouth daily. 90 tablet 1  . hyoscyamine (LEVSIN SL) 0.125 MG SL  tablet Place 1 tablet (0.125 mg total) under the tongue every 6 (six) hours as needed. 30 tablet 0  . levothyroxine (SYNTHROID) 50 MCG tablet Take 1 tablet by mouth once daily 90 tablet 0  . lisinopril (ZESTRIL) 5 MG tablet Take 1 tablet (5 mg total) by mouth 2 (two) times daily. 60 tablet 2  . metoprolol succinate (TOPROL-XL) 25 MG 24 hr tablet Take 1 tablet (25 mg total) by mouth daily. (Patient taking differently: Take 25 mg by mouth at bedtime. ) 30 tablet 11  . omega-3 acid ethyl esters (LOVAZA) 1 g capsule Take 2 g by mouth daily.    Marland Kitchen omeprazole (PRILOSEC) 40 MG capsule Take 1 capsule (40 mg total) by mouth daily. 30 capsule 4  . ondansetron (ZOFRAN) 4 MG tablet Take 1 tablet (4 mg total) by mouth every 8 (eight) hours as needed for nausea or vomiting. 30 tablet 1  . potassium chloride (KLOR-CON) 10 MEQ tablet Take 1 tablet (10 mEq total) by mouth daily. 90 tablet 1  . pramipexole (MIRAPEX) 0.125 MG tablet Take 1 tablet (0.125 mg total) by mouth 2 (two) times daily. 60 tablet 1  . primidone (MYSOLINE) 50 MG tablet TAKE 1 TABLET BY MOUTH AT BEDTIME 90 tablet 0  . sucralfate (CARAFATE) 1 g tablet Take 1 tablet (1 g total) by mouth 2 (two) times daily. 60 tablet 1  . cephALEXin (KEFLEX) 500 MG capsule Take 1 capsule (500 mg total) by mouth 2 (two) times daily. 14 capsule 0   No facility-administered medications prior to visit.    No Known Allergies  Review of Systems  Constitutional: Negative for fever and malaise/fatigue.  HENT: Negative for congestion.   Eyes: Negative  for blurred vision.  Respiratory: Negative for shortness of breath.   Cardiovascular: Negative for chest pain, palpitations and leg swelling.  Gastrointestinal: Positive for abdominal pain, diarrhea and nausea. Negative for blood in stool and vomiting.  Genitourinary: Positive for frequency. Negative for dysuria.  Musculoskeletal: Negative for falls.  Skin: Negative for rash.  Neurological: Negative for dizziness, loss of consciousness and headaches.  Endo/Heme/Allergies: Negative for environmental allergies.  Psychiatric/Behavioral: Negative for depression.       Objective:    Physical Exam unable to obtain via phone visit.    There were no vitals taken for this visit. Wt Readings from Last 3 Encounters:  06/01/19 124 lb (56.2 kg)  05/28/19 124 lb (56.2 kg)  05/03/19 127 lb 12.8 oz (58 kg)    Diabetic Foot Exam - Simple   No data filed     Lab Results  Component Value Date   WBC 5.2 06/04/2019   HGB 11.7 (L) 06/04/2019   HCT 34.8 (L) 06/04/2019   PLT 219.0 06/04/2019   GLUCOSE 109 (H) 06/04/2019   CHOL 165 05/03/2019   TRIG 131.0 05/03/2019   HDL 72.70 05/03/2019   LDLCALC 66 05/03/2019   ALT 15 06/04/2019   AST 22 06/04/2019   NA 135 06/04/2019   K 4.1 06/04/2019   CL 99 06/04/2019   CREATININE 1.26 (H) 06/04/2019   BUN 26 (H) 06/04/2019   CO2 27 06/04/2019   TSH 1.56 05/03/2019   INR 1.2 02/16/2019   HGBA1C 5.9 05/03/2019    Lab Results  Component Value Date   TSH 1.56 05/03/2019   Lab Results  Component Value Date   WBC 5.2 06/04/2019   HGB 11.7 (L) 06/04/2019   HCT 34.8 (L) 06/04/2019   MCV 88.9 06/04/2019   PLT  219.0 06/04/2019   Lab Results  Component Value Date   NA 135 06/04/2019   K 4.1 06/04/2019   CO2 27 06/04/2019   GLUCOSE 109 (H) 06/04/2019   BUN 26 (H) 06/04/2019   CREATININE 1.26 (H) 06/04/2019   BILITOT 0.5 06/04/2019   ALKPHOS 85 06/04/2019   AST 22 06/04/2019   ALT 15 06/04/2019   PROT 6.9 06/04/2019   ALBUMIN 4.2  06/04/2019   CALCIUM 9.0 06/04/2019   ANIONGAP 11 02/24/2019   GFR 41.12 (L) 06/04/2019   Lab Results  Component Value Date   CHOL 165 05/03/2019   Lab Results  Component Value Date   HDL 72.70 05/03/2019   Lab Results  Component Value Date   LDLCALC 66 05/03/2019   Lab Results  Component Value Date   TRIG 131.0 05/03/2019   Lab Results  Component Value Date   CHOLHDL 2 05/03/2019   Lab Results  Component Value Date   HGBA1C 5.9 05/03/2019       Assessment & Plan:   Problem List Items Addressed This Visit    Essential hypertension (Chronic)    Monitor and report any concerns, no changes to meds. Encouraged heart healthy diet such as the DASH diet and exercise as tolerated.       Hypothyroid    On Levothyroxine, continue to monitor      Urinary tract infection    She was recently treated with Keflex for a UTI and her urinary symptoms have resolved but she does note she struggled with nausea and diarrhea while taking it. These have improved off of Keflex as well. Encouraged to maintain hydration and probiotics      Hyperglycemia    hgba1c acceptable, minimize simple carbs. Increase exercise as tolerated.          I have discontinued Emya Phair. Malachi's cephALEXin. I am also having her maintain her metoprolol succinate, fluticasone, omega-3 acid ethyl esters, atorvastatin, ferrous sulfate, ondansetron, albuterol, lisinopril, furosemide, citalopram, omeprazole, ASPIRIN 81 PO, potassium chloride, levothyroxine, pramipexole, primidone, sucralfate, famotidine, and hyoscyamine.  No orders of the defined types were placed in this encounter.    I discussed the assessment and treatment plan with the patient. The patient was provided an opportunity to ask questions and all were answered. The patient agreed with the plan and demonstrated an understanding of the instructions.   The patient was advised to call back or seek an in-person evaluation if the symptoms worsen or if  the condition fails to improve as anticipated.  I provided 25 minutes of non-face-to-face time during this encounter.   Penni Homans, MD

## 2019-07-22 NOTE — Assessment & Plan Note (Signed)
hgba1c acceptable, minimize simple carbs. Increase exercise as tolerated.  

## 2019-07-22 NOTE — Assessment & Plan Note (Signed)
Monitor and report any concerns, no changes to meds. Encouraged heart healthy diet such as the DASH diet and exercise as tolerated.  ?

## 2019-07-22 NOTE — Assessment & Plan Note (Signed)
She was recently treated with Keflex for a UTI and her urinary symptoms have resolved but she does note she struggled with nausea and diarrhea while taking it. These have improved off of Keflex as well. Encouraged to maintain hydration and probiotics

## 2019-07-22 NOTE — Assessment & Plan Note (Signed)
On Levothyroxine, continue to monitor 

## 2019-07-24 ENCOUNTER — Other Ambulatory Visit: Payer: Self-pay | Admitting: *Deleted

## 2019-07-24 MED ORDER — METOPROLOL SUCCINATE ER 25 MG PO TB24: 25.0000 mg | ORAL_TABLET | Freq: Every day | ORAL | 1 refills | Status: DC

## 2019-08-02 DIAGNOSIS — R69 Illness, unspecified: Secondary | ICD-10-CM | POA: Diagnosis not present

## 2019-08-06 IMAGING — DX DG CHEST 2V
2 series · 2 of 2 positions shown · non-contrast
Comparison: 12/19/2017

CLINICAL DATA: Cough and congestion for several weeks

EXAM:
CHEST - 2 VIEW

[chest pa]
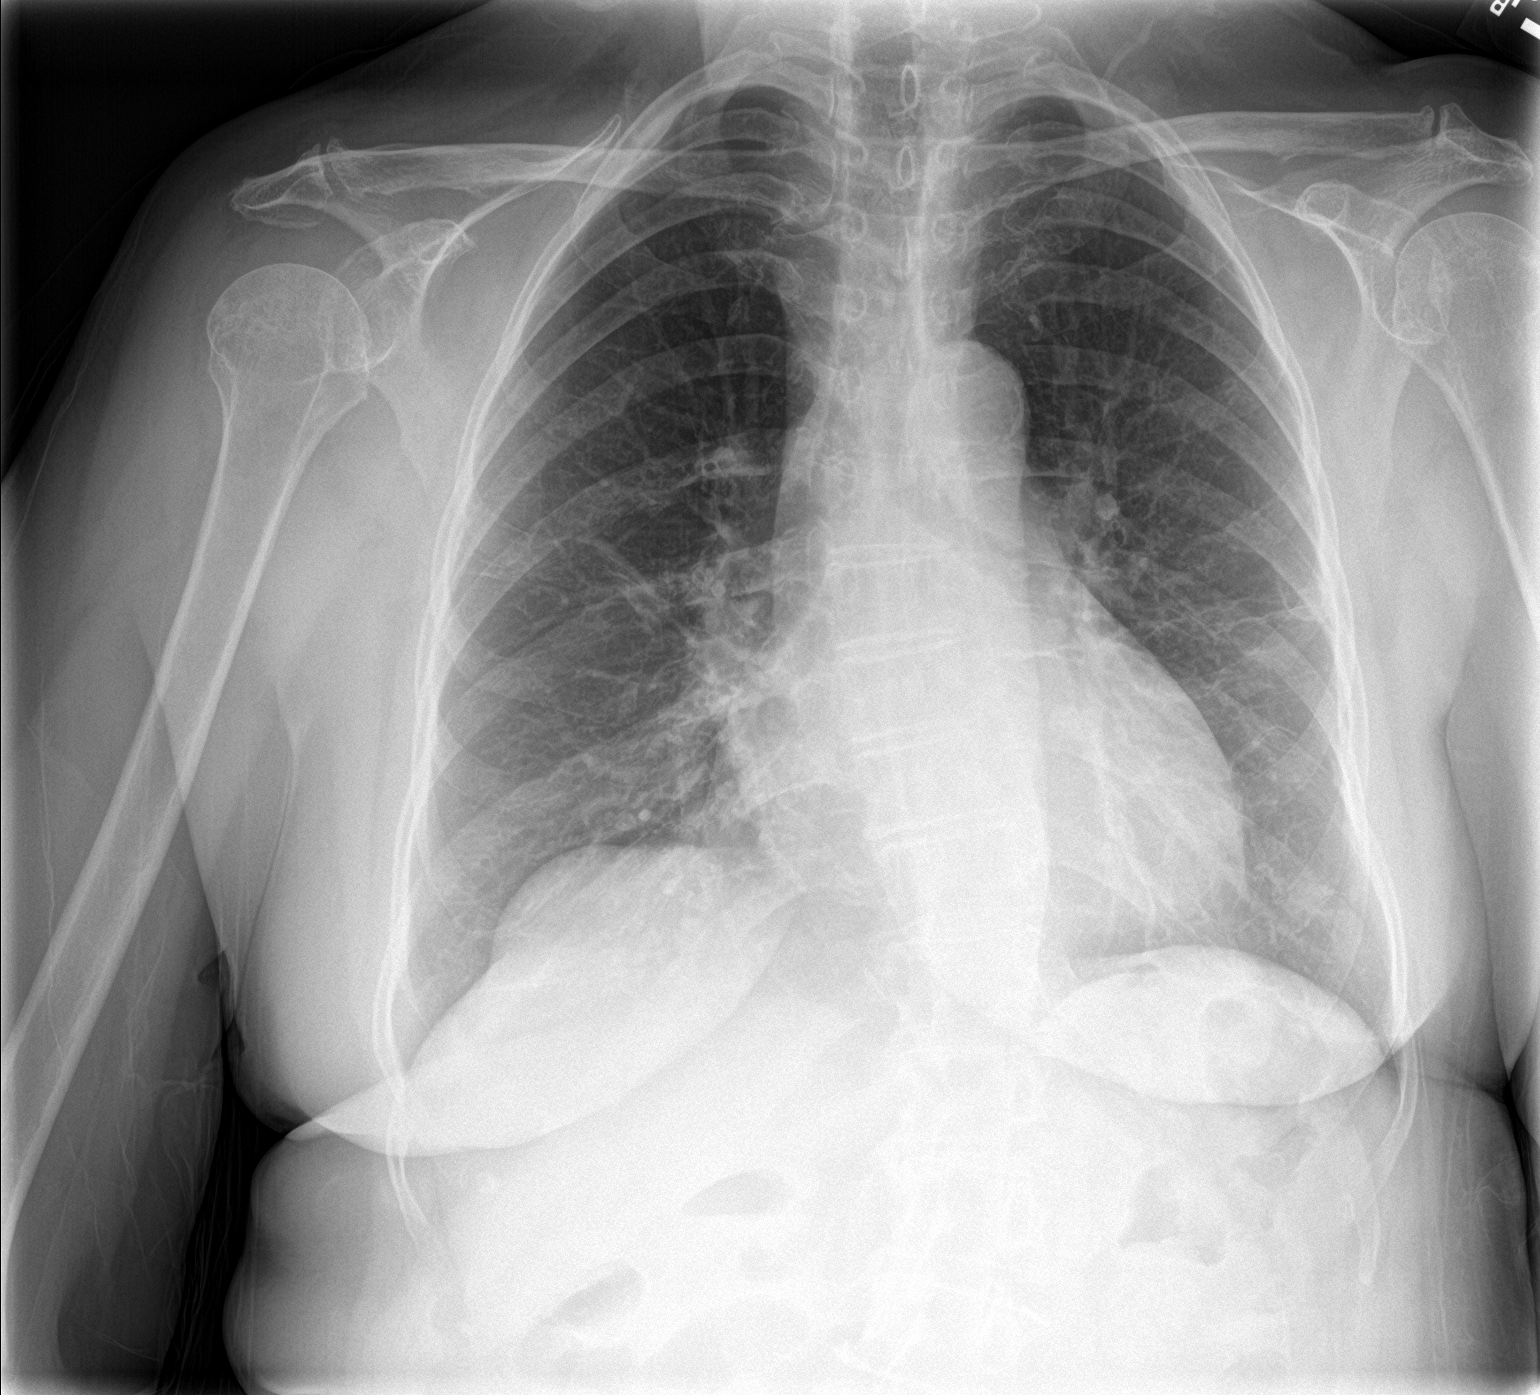

[chest lat]
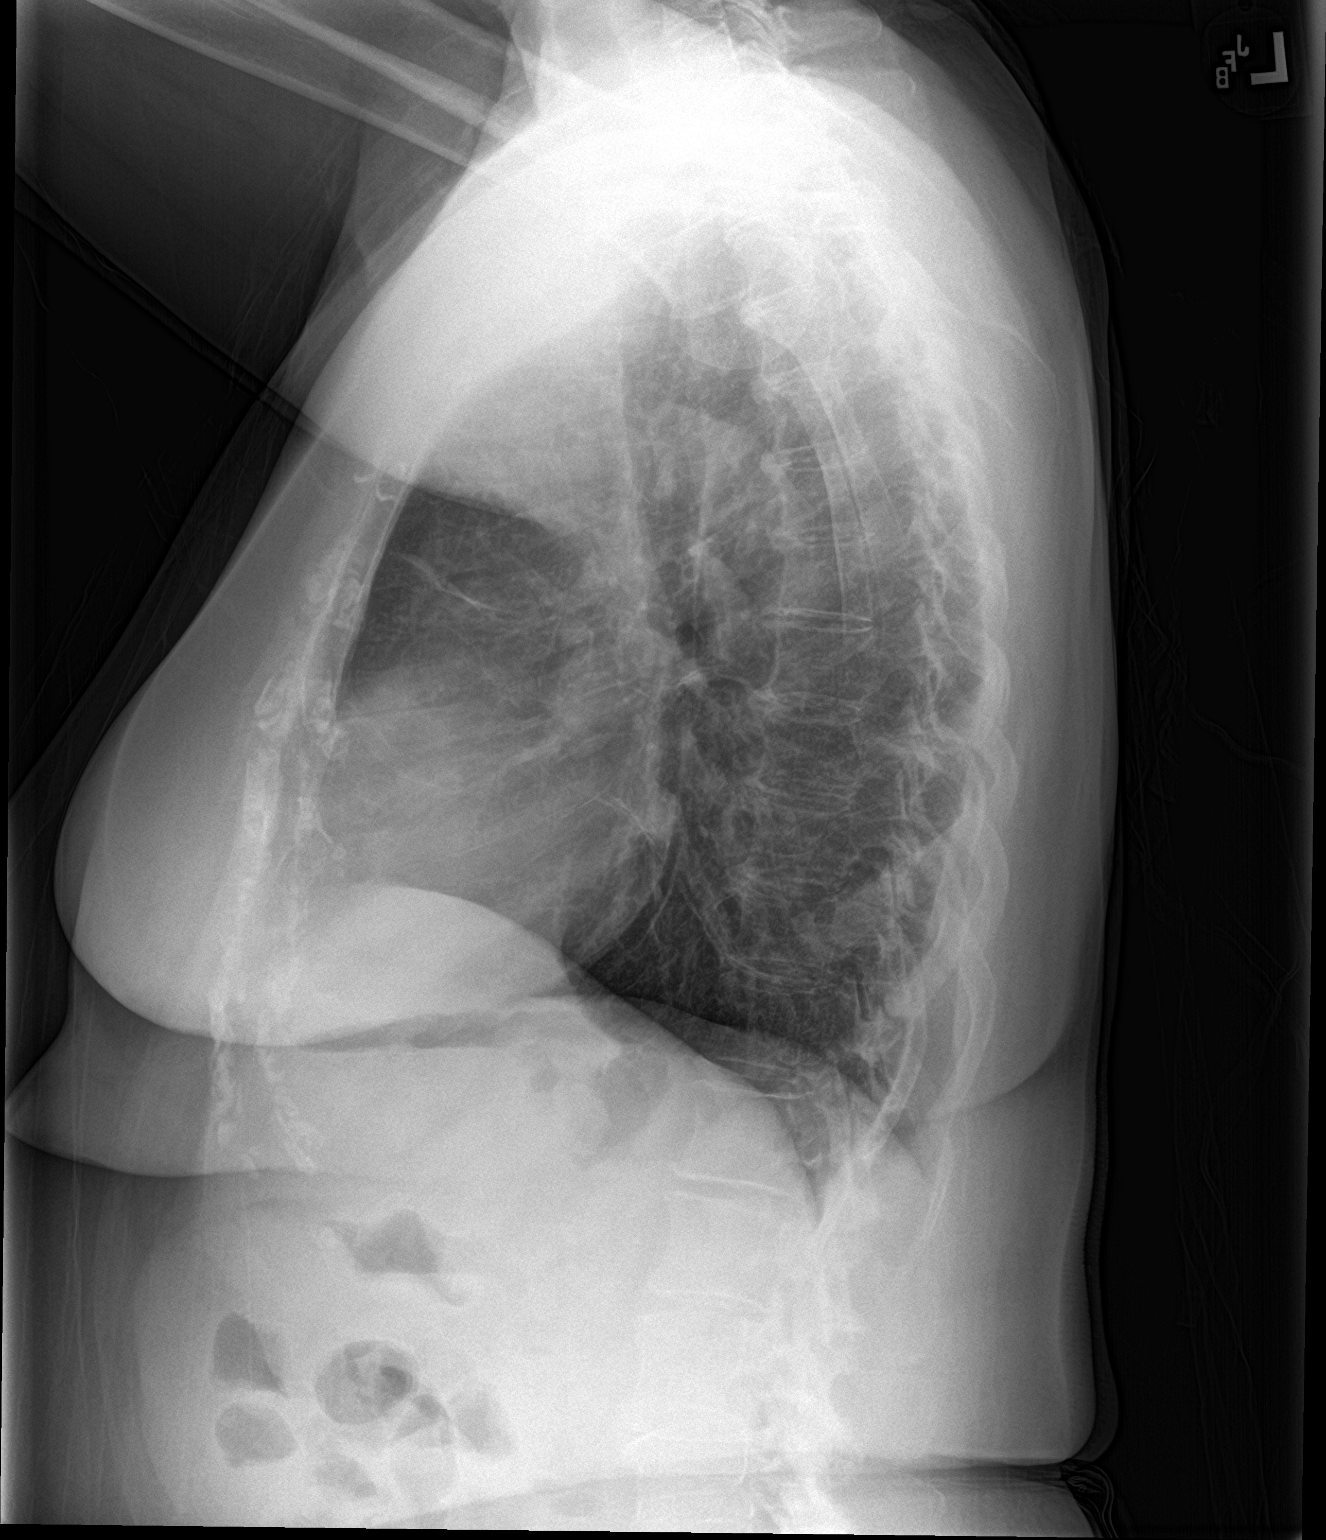

[2 of 2 positions shown; findings below may reference images not displayed]

FINDINGS: Cardiac shadow is stable. Aortic calcifications are again seen. The
lungs are well aerated bilaterally. Linear scarring is noted within
the left lung. No acute bony abnormality is noted.
IMPRESSION: No acute abnormality noted. Chronic scarring in the left lung is
seen.

## 2019-08-09 ENCOUNTER — Encounter: Payer: Self-pay | Admitting: Gastroenterology

## 2019-08-09 ENCOUNTER — Ambulatory Visit (INDEPENDENT_AMBULATORY_CARE_PROVIDER_SITE_OTHER): Payer: Medicare HMO | Admitting: Gastroenterology

## 2019-08-09 VITALS — BP 126/58 | HR 70 | Temp 97.9°F | Ht <= 58 in | Wt 127.0 lb

## 2019-08-09 DIAGNOSIS — R1032 Left lower quadrant pain: Secondary | ICD-10-CM | POA: Diagnosis not present

## 2019-08-09 DIAGNOSIS — K219 Gastro-esophageal reflux disease without esophagitis: Secondary | ICD-10-CM | POA: Diagnosis not present

## 2019-08-09 DIAGNOSIS — R194 Change in bowel habit: Secondary | ICD-10-CM | POA: Diagnosis not present

## 2019-08-09 DIAGNOSIS — R159 Full incontinence of feces: Secondary | ICD-10-CM

## 2019-08-09 MED ORDER — SUPREP BOWEL PREP KIT 17.5-3.13-1.6 GM/177ML PO SOLN: ORAL | 0 refills | Status: DC

## 2019-08-09 MED ORDER — OMEPRAZOLE 40 MG PO CPDR: DELAYED_RELEASE_CAPSULE | ORAL | 5 refills | Status: DC

## 2019-08-09 MED ORDER — HYOSCYAMINE SULFATE 0.125 MG SL SUBL: 0.1250 mg | SUBLINGUAL_TABLET | Freq: Three times a day (TID) | SUBLINGUAL | 3 refills | Status: DC

## 2019-08-09 NOTE — Progress Notes (Signed)
HPI :  79 year old female here for follow-up visit for history of GERD, dyspepsia, now with bowel habit changes.  I last saw her in July 2020.  At her last visit she had complained of constipation and we had recommended some MiraLAX for her bowel symptoms.  She had a significant change in her bowel habits for the past several months however.  She states she has loose stools about 75% of the time that is very watery.  She states she has variable stool frequency but at times can have upwards of 20 bowel movements in a day with a lot of urgency to go and episodes of fecal incontinence.  Is been very troublesome for her.  She also has some left lower quadrant discomfort that is there most of the time, often worse with some of her bowel movements.  She feels bloated frequently.  She has been taking Levsin a few times a day but does not help as much.  No blood in her stools.  Her last colonoscopy was done in 2017 at [facility], no biopsies were done at that time for her bowels.  She has had negative serologic testing in the past for celiac disease.  Otherwise we had intended to add Pepcid 40 mg to her dose of omeprazole for her reflux and dyspepsia, in addition to Carafate.  It sounds like she actually stopped the omeprazole in favor of the Pepcid and has been taking that and Carafate together.  She states the Carafate does help but she continues to have frequent reflux symptoms that are bothering her.  Her last EGD was in 2017 in [facility] where she was noted to have a 5 cm hiatal hernia.  I performed a gastric emptying study for her last July which was normal  She most recently had a CT scan done per Dr. Charlett Blake in January 2021.  Have any inflammation of her colon noted, but she did have a moderate to large hiatal hernia with upwards of 25 to 50% of her stomach in the chest.  Also recently she had stool that was negative for occult blood and normal iron studies.  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 Ileum normal. No polyps or evidence of colitis. Biopsies not taken. EGD 08/05/2015 - 5cm HH, mild gastritis on path, mild esophagitis with esophagus with normal path, normal duodenum on path   CT scan 06/06/2019: IMPRESSION: 1. No acute findings in the abdomen or pelvis. 2. Moderate hiatal hernia with 25-50% of the stomach contained in the chest. 3. Left colonic diverticulosis without diverticulitis. 4.  Aortic Atherosclerois (ICD10-170.0)  GES 12/07/18 - normal       Past Medical History:  Diagnosis Date  . Anemia   . Anginal pain ([place])   . Arthritis   . Asthma   . Breast cancer ([place])    2002, left, encapsulated, microcalcifications. lumpectomy, radtiation x 30  . Breast cancer in female [facility])   . Change in mole 06/08/2016  . Colitis   . Complication of anesthesia    VERY SENSITIVE TO ANESTHESIA   . Decreased visual acuity 01/16/2017  . Dyspnea 11/04/2016   with asthma exacerbation only  . Dysuria 11/04/2016  . Gluten intolerance   . History of chicken pox   . History of Helicobacter  pylori infection 08/01/2015  . Hyperlipidemia   . Hypertension   . Hypothyroidism   . IBS (irritable bowel syndrome)   . Left leg pain 03/01/2017  . Low back pain 08/10/2015  . Mild vascular neurocognitive disorder ([place]) 04/13/2019  . Neck pain 03/01/2017  . Osteoporosis   . Personal history of radiation therapy   . Pneumonia   . PONV (postoperative nausea and vomiting)   . RLQ discomfort 03/01/2017  . Stomach cramps   . Urinary tract infection 11/04/2016     Past Surgical History:  Procedure Laterality Date  . ABDOMINAL HYSTERECTOMY     partial  . APPENDECTOMY    . BREAST LUMPECTOMY Left 2002  . CHOLECYSTECTOMY  Age 33 or 59  . COLONOSCOPY  2017  . EYE SURGERY Left    cataract  . LEFT  HEART CATH AND CORONARY ANGIOGRAPHY N/A 02/09/2019   Procedure: LEFT HEART CATH AND CORONARY ANGIOGRAPHY;  Surgeon: Nelva Bush, MD;  Location: [place] [facility];  Service: Cardiovascular;  Laterality: N/A;  . TONSILLECTOMY    . TOTAL HIP ARTHROPLASTY Left 02/13/2019   Procedure: TOTAL HIP ARTHROPLASTY ANTERIOR APPROACH;  Surgeon: Paralee Cancel, MD;  Location: [facility];  Service: Orthopedics;  Laterality: Left;  70 mins   Family History  Problem Relation Age of Onset  . Colitis Mother   . Irritable bowel syndrome Mother   . Hypertension Father   . Hyperlipidemia Brother   . Heart attack Brother   . Stomach cancer Paternal Grandmother 90  . Hyperlipidemia Brother   . Heart attack Brother   . Hyperlipidemia Brother   . Heart attack Brother   . Hyperlipidemia Sister   . Leukemia Daughter   . Arthritis Daughter   . Heart disease Daughter        ASD vs VSD  . Colon cancer Neg Hx    Social History   Tobacco Use  . Smoking status: Never Smoker  . Smokeless tobacco: Never Used  Substance Use Topics  . Alcohol use: Never    Alcohol/week: 0.0 standard drinks  . Drug use: Never   Current Outpatient Medications  Medication Sig Dispense Refill  . albuterol (VENTOLIN HFA) 108 (90 Base) MCG/ACT inhaler Inhale 2 puffs into the lungs every 6 (six) hours as needed for wheezing or shortness of breath. 18 g 2  . ASPIRIN 81 PO 81 mg 2 (two) times daily.    Marland Kitchen atorvastatin (LIPITOR) 20 MG tablet Take 1 tablet (20 mg total) by mouth daily. 90 tablet 3  . citalopram (CELEXA) 20 MG tablet Take 1 tablet (20 mg total) by mouth at bedtime. 90 tablet 1  . famotidine (PEPCID) 40 MG tablet Take 1 tablet (40 mg total) by mouth daily. 30 tablet 3  . fluticasone (FLOVENT HFA) 110 MCG/ACT inhaler Inhale 1 puff into the lungs 2 (two) times daily. (Patient taking differently: Inhale 1 puff into the lungs 2 (two) times daily as needed (shortness of breath or wheezing). ) 1 Inhaler 12  . furosemide (LASIX) 40  MG tablet Take 1 tablet (40 mg total) by mouth daily. 90 tablet 1  . hyoscyamine (LEVSIN SL) 0.125 MG SL tablet Place 1 tablet (0.125 mg total) under the tongue every 6 (six) hours as needed. 30 tablet 0  . levothyroxine (SYNTHROID) 50 MCG tablet Take 1 tablet by mouth once daily 90 tablet 0  . lisinopril (ZESTRIL) 5 MG tablet Take 1 tablet (5 mg total) by mouth 2 (two) times daily. 60 tablet 2  .  metoprolol succinate (TOPROL-XL) 25 MG 24 hr tablet Take 1 tablet (25 mg total) by mouth at bedtime. 90 tablet 1  . omega-3 acid ethyl esters (LOVAZA) 1 g capsule Take 2 g by mouth daily.    Marland Kitchen omeprazole (PRILOSEC) 40 MG capsule Take 1 capsule (40 mg total) by mouth daily. 30 capsule 4  . ondansetron (ZOFRAN) 4 MG tablet Take 1 tablet (4 mg total) by mouth every 8 (eight) hours as needed for nausea or vomiting. 30 tablet 1  . potassium chloride (KLOR-CON) 10 MEQ tablet Take 1 tablet (10 mEq total) by mouth daily. 90 tablet 1  . pramipexole (MIRAPEX) 0.125 MG tablet Take 1 tablet (0.125 mg total) by mouth 2 (two) times daily. 60 tablet 1  . primidone (MYSOLINE) 50 MG tablet TAKE 1 TABLET BY MOUTH AT BEDTIME 90 tablet 0  . sucralfate (CARAFATE) 1 g tablet Take 1 tablet (1 g total) by mouth 2 (two) times daily. 60 tablet 1  . ferrous sulfate (FERROUSUL) 325 (65 FE) MG tablet Take 1 tablet (325 mg total) by mouth 3 (three) times daily with meals for 14 days. 42 tablet 0   No current facility-administered medications for this visit.   No Known Allergies   Review of Systems: All systems reviewed and negative except where noted in HPI.   Lab Results  Component Value Date   WBC 5.2 06/04/2019   HGB 11.7 (L) 06/04/2019   HCT 34.8 (L) 06/04/2019   MCV 88.9 06/04/2019   PLT 219.0 06/04/2019    Lab Results  Component Value Date   CREATININE 1.26 (H) 06/04/2019   BUN 26 (H) 06/04/2019   NA 135 06/04/2019   K 4.1 06/04/2019   CL 99 06/04/2019   CO2 27 06/04/2019    Lab Results  Component Value  Date   ALT 15 06/04/2019   AST 22 06/04/2019   ALKPHOS 85 06/04/2019   BILITOT 0.5 06/04/2019   Lab Results  Component Value Date   IRON 58 06/04/2019   TIBC 321 06/04/2019   FERRITIN 52 06/04/2019     Physical Exam: BP (!) 126/58   Pulse 70   Temp 97.9 F (36.6 C)   Ht 4\' 10"  (1.473 m)   Wt 127 lb (57.6 kg)   BMI 26.54 kg/m  Constitutional: Pleasant,well-developed, female in no acute distress. HEENT: Normocephalic and atraumatic. Conjunctivae are normal. No scleral icterus. Neck supple.  Cardiovascular: Normal rate, regular rhythm.  Pulmonary/chest: Effort normal and breath sounds normal. No wheezing, rales or rhonchi. Abdominal: Soft, nondistended, LLQ TTP, mild. There are no masses palpable. No hepatomegaly. Extremities: no edema Lymphadenopathy: No cervical adenopathy noted. Neurological: Alert and oriented to person place and time. Skin: Skin is warm and dry. No rashes noted. Psychiatric: Normal mood and affect. Behavior is normal.   ASSESSMENT AND PLAN: 79 year old female here for reassessment of the following issues:  Change in bowel habits / fecal incontinence / abdominal pain - patient with a significant change in her bowel habits since have last seen her, now with incontinence and mostly very loose stools with urgency and high frequency.  We discussed options.  I am recommending a colonoscopy to further evaluate her symptoms and rule out colitis/microscopic colitis.  I discussed risks and benefits with her and she wanted to proceed.  She is rather anxious about receiving anesthesia, and may wish to proceed with this unsedated given she felt poorly after having anesthesia for her hip over the past year or 2.  I  counseled her that this is different type of sedation and I think she would do fine with propofol, she wishes to discuss with anesthesia provider the date of her exam.  She understands the preparation may be difficult for her given her current stool frequency  but she thinks she can handle it.  I asked her to increase her Levsin in the interim for her cramping.  Recent CT scan otherwise reassuring.  GERD / hiatal hernia - large hiatal hernia, negative gastric emptying study, she needs to be back on PPI given her ongoing symptoms.  We will resume omeprazole at 40 mg once a day and she can increase to twice daily as needed, long-term want to use the lowest dose required to control symptoms but she probably will respond better to higher dosing.  We will await her course and I will touch base with her again about this at her colonoscopy.  Continue Carafate as needed otherwise.  We discussed that if symptoms are refractory to medical management she would warrant surgical repair of the hiatal hernia, she wishes to avoid that if at all possible  Winfield Cellar, MD [facility]

## 2019-08-09 NOTE — Patient Instructions (Addendum)
If you are age 79 or older, your body mass index should be between 23-30. Your Body mass index is 26.54 kg/m. If this is out of the aforementioned range listed, please consider follow up with your Primary Care Provider.  If you are age 36 or younger, your body mass index should be between 19-25. Your Body mass index is 26.54 kg/m. If this is out of the aformentioned range listed, please consider follow up with your Primary Care Provider.   You have been scheduled for a colonoscopy. Please follow written instructions given to you at your visit today.  Please pick up your prep supplies at the pharmacy within the next 1-3 days. If you use inhalers (even only as needed), please bring them with you on the day of your procedure.  Increase your Levsin to three (3)  times a day.  Resume taking omeprazole 40 mg once to twice daily.   Thank you for entrusting me with your care and for choosing [facility], Dr. Dickson Cellar    .

## 2019-08-10 ENCOUNTER — Other Ambulatory Visit: Payer: Self-pay | Admitting: Family Medicine

## 2019-08-13 ENCOUNTER — Telehealth: Payer: Self-pay | Admitting: Cardiology

## 2019-08-13 ENCOUNTER — Telehealth: Payer: Self-pay | Admitting: Gastroenterology

## 2019-08-13 ENCOUNTER — Encounter: Payer: Self-pay | Admitting: Gastroenterology

## 2019-08-13 NOTE — Telephone Encounter (Signed)
   [facility] Pre-operative Risk Assessment    Request for surgical clearance:  1. What type of surgery is being performed? COLONOSCOPY   2. When is this surgery scheduled? 08/14/19   3. What type of clearance is required (medical clearance vs. Pharmacy clearance to hold med vs. Both)? MEDICAL  4. Are there any medications that need to be held prior to surgery and how long? PER DR. ARMBRUSTER ASA DOES NOT NEED TO BE HELD  5. Practice name and name of physician performing surgery? [facility]; DR. Havery Moros  6. What is your office phone number [phone]    7.   What is your office fax number [phone]  8.   Anesthesia type (None, local, MAC, general) ? PROPOFOL? NOT LISTED   Julaine Hua 08/13/2019, 11:19 AM  _________________________________________________________________   (provider comments below)

## 2019-08-13 NOTE — Telephone Encounter (Signed)
Patient states she has a colonoscopy scheduled for tomorrow, 08/14/19 and she is requesting clearance from Dr. Terrial Rhodes nurse. I have made the patient aware that the office performing the colonoscopy will need to contact our office.

## 2019-08-13 NOTE — Telephone Encounter (Signed)
Faxed response back to [facility], that we do Not require patient's to hold their aspirin for a colonoscopy

## 2019-08-13 NOTE — Telephone Encounter (Signed)
   Primary Cardiologist: Berniece Salines, DO  Chart reviewed as part of pre-operative protocol coverage. Patient contacted today as part of pre-op evaluation and reported doing well from a cardiac standpoint since last office visit with Dr. Harriet Masson in 05/2019. She denies any chest pain, shortness of breath, palpitations, lightheadedness, dizziness, syncope, orthopnea, or edema. She does report some episodes of bilateral upper extremity numbness. For example, she notes numbness of left arm if she lays on her left side and has also noticed right arm noticed if she hold onto something tight with her right hand. 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 No other stroke symptoms. Able to complete >4.0 METS without any angina or dyspnea. Advised following up with PCP regarding upper extremity numbness.    Given past medical history and time since last visit, based on ACC/AHA guidelines, Julie Wilkerson would be at acceptable risk for the planned procedure without further cardiovascular testing.   I will route this recommendation to the requesting party via Epic fax function and remove from pre-op pool.  Please call with questions.  Darreld Mclean, PA-C 08/13/2019, 11:42 AM

## 2019-08-13 NOTE — Telephone Encounter (Signed)
I s/w [facility] and asked of they would please fax over clearance information as pt's colonoscopy is set for tomorrow 08/14/19, fax # [phone].

## 2019-08-13 NOTE — Telephone Encounter (Signed)
Can we see if we can get clearance form from GI office?  Thank you!

## 2019-08-13 NOTE — Telephone Encounter (Signed)
[place] office called stating this patient is scheduled for tomorrow to have colonoscopy but they did not receive clearance form. They said she is only on the Aspirin but wanted to check with our office. Fax [phone].

## 2019-08-14 ENCOUNTER — Ambulatory Visit (AMBULATORY_SURGERY_CENTER): Payer: Medicare HMO | Admitting: Gastroenterology

## 2019-08-14 ENCOUNTER — Encounter: Payer: Self-pay | Admitting: Gastroenterology

## 2019-08-14 ENCOUNTER — Other Ambulatory Visit: Payer: Self-pay

## 2019-08-14 VITALS — BP 131/54 | HR 70 | Temp 97.7°F | Resp 22 | Ht <= 58 in | Wt 127.0 lb

## 2019-08-14 DIAGNOSIS — K648 Other hemorrhoids: Secondary | ICD-10-CM | POA: Diagnosis not present

## 2019-08-14 DIAGNOSIS — K5730 Diverticulosis of large intestine without perforation or abscess without bleeding: Secondary | ICD-10-CM | POA: Diagnosis not present

## 2019-08-14 DIAGNOSIS — D123 Benign neoplasm of transverse colon: Secondary | ICD-10-CM | POA: Diagnosis not present

## 2019-08-14 DIAGNOSIS — D124 Benign neoplasm of descending colon: Secondary | ICD-10-CM | POA: Diagnosis not present

## 2019-08-14 DIAGNOSIS — R194 Change in bowel habit: Secondary | ICD-10-CM | POA: Diagnosis not present

## 2019-08-14 DIAGNOSIS — D120 Benign neoplasm of cecum: Secondary | ICD-10-CM

## 2019-08-14 MED ORDER — DICYCLOMINE HCL 10 MG PO CAPS: 10.0000 mg | ORAL_CAPSULE | Freq: Three times a day (TID) | ORAL | 1 refills | Status: DC | PRN

## 2019-08-14 MED ORDER — SODIUM CHLORIDE 0.9 % IV SOLN: 500.0000 mL | Freq: Once | INTRAVENOUS | Status: DC

## 2019-08-14 NOTE — Progress Notes (Signed)
jb-tempo  Dt-vs  Kassie Mends notified to come talk w/ pt about anesthesia due to pt concerns as she says she got really sick after leg surgery and went into CHF( 1st time & only time for having CHF 2 days after that surgery .

## 2019-08-14 NOTE — Op Note (Signed)
[facility] Patient Name: Julie Wilkerson Procedure Date: 08/14/2019 10:11 AM MRN: [id number] Endoscopist: Remo Lipps P. Havery Moros , MD Age: 79 Referring MD:  Date of Birth: 20-Dec-1940 Gender: Female Account #: [id number] Procedure:                Colonoscopy Indications:              Change in bowel habits, Fecal incontinence Medicines:                Monitored Anesthesia Care Procedure:                Pre-Anesthesia Assessment:                           - Prior to the procedure, a History and Physical                            was performed, and patient medications and                            allergies were reviewed. The patient's tolerance of                            previous anesthesia was also reviewed. The risks                            and benefits of the procedure and the sedation                            options and risks were discussed with the patient.                            All questions were answered, and informed consent                            was obtained. Prior Anticoagulants: The patient has                            taken no previous anticoagulant or antiplatelet                            agents. ASA Grade Assessment: III - A patient with                            severe systemic disease. After reviewing the risks                            and benefits, the patient was deemed in                            satisfactory condition to undergo the procedure.                           After obtaining informed consent, the colonoscope  was passed under direct vision. Throughout the                            procedure, the patient's blood pressure, pulse, and                            oxygen saturations were monitored continuously. The                            Colonoscope was introduced through the anus and                            advanced to the the terminal ileum, with                            identification of the  appendiceal orifice and IC                            valve. The colonoscopy was performed without                            difficulty. The patient tolerated the procedure                            well. The quality of the bowel preparation was                            good. The terminal ileum, ileocecal valve,                            appendiceal orifice, and rectum were photographed. Scope In: 10:24:17 AM Scope Out: 10:47:28 AM Scope Withdrawal Time: 0 hours 19 minutes 41 seconds  Total Procedure Duration: 0 hours 23 minutes 11 seconds  Findings:                 The perianal and digital rectal examinations were                            normal.                           The terminal ileum appeared normal.                           A diminutive polyp was found in the cecum. The                            polyp was sessile. The polyp was removed with a                            cold biopsy forceps. Resection and retrieval were                            complete.  Two flat polyps were found in the transverse colon.                            The polyps were diminutive in size. These polyps                            were removed with a cold biopsy forceps. Resection                            and retrieval were complete.                           A 5 mm polyp was found in the splenic flexure. The                            polyp was flat. The polyp was removed with a cold                            snare. Resection and retrieval were complete.                           A 6 mm polyp was found in the descending colon. The                            polyp was flat. The polyp was removed with a cold                            snare. Resection and retrieval were complete.                           A few small-mouthed diverticula were found in the                            sigmoid colon.                           Internal hemorrhoids were found during  retroflexion.                           The exam was otherwise without abnormality.                           Biopsies for histology were taken with a cold                            forceps from the right colon, left colon and                            transverse colon for evaluation of microscopic                            colitis. Complications:            No immediate complications. Estimated blood loss:  Minimal. Estimated Blood Loss:     Estimated blood loss was minimal. Impression:               - The examined portion of the ileum was normal.                           - One diminutive polyp in the cecum, removed with a                            cold biopsy forceps. Resected and retrieved.                           - Two diminutive polyps in the transverse colon,                            removed with a cold biopsy forceps. Resected and                            retrieved.                           - One 5 mm polyp at the splenic flexure, removed                            with a cold snare. Resected and retrieved.                           - One 6 mm polyp in the descending colon, removed                            with a cold snare. Resected and retrieved.                           - Diverticulosis in the sigmoid colon.                           - Internal hemorrhoids.                           - The examination was otherwise normal.                           - Biopsies were taken with a cold forceps from the                            right colon, left colon and transverse colon for                            evaluation of microscopic colitis. Recommendation:           - Patient has a contact number available for                            emergencies. The signs and symptoms of potential  delayed complications were discussed with the                            patient. Return to normal activities tomorrow.                             Written discharge instructions were provided to the                            patient.                           - Resume previous diet.                           - Continue present medications.                           - Await pathology results.                           - Trial of immodium one tab q AM and titrate as                            needed until biopsies return [place]. Havery Moros, MD 08/14/2019 10:53:43 AM This report has been signed electronically.

## 2019-08-14 NOTE — Patient Instructions (Addendum)
HANDOUTS PROVIDED ON: POLYPS, DIVERTICULOSIS, & HEMORRHOIDS  The polyps removed/biopsies taken today have been sent for pathology.  The results can take 1-3 weeks to receive.  When your next colonoscopy should occur will be based on the pathology results.    You may resume your previous diet and medication schedule.  Please take 1 tablet of Immodium every morning and decrease as needed until the pathology from biopsies return.  Thank you for allowing [name] to care for you today!!!   YOU HAD AN ENDOSCOPIC PROCEDURE TODAY AT [facility]:   Refer to the procedure report that was given to you for any specific questions about what was found during the examination.  If the procedure report does not answer your questions, please call your gastroenterologist to clarify.  If you requested that your care partner not be given the details of your procedure findings, then the procedure report has been included in a sealed envelope for you to review at your convenience later.  YOU SHOULD EXPECT: Some feelings of bloating in the abdomen. Passage of more gas than usual.  Walking can help get rid of the air that was put into your GI tract during the procedure and reduce the bloating. If you had a lower endoscopy (such as a colonoscopy or flexible sigmoidoscopy) you may notice spotting of blood in your stool or on the toilet paper. If you underwent a bowel prep for your procedure, you may not have a normal bowel movement for a few days.  Please Note:  You might notice some irritation and congestion in your nose or some drainage.  This is from the oxygen used during your procedure.  There is no need for concern and it should clear up in a day or so.  SYMPTOMS TO REPORT IMMEDIATELY:   Following lower endoscopy (colonoscopy or flexible sigmoidoscopy):  Excessive amounts of blood in the stool  Significant tenderness or worsening of abdominal pains  Swelling of the abdomen that is new, acute  Fever of  100F or higher  For urgent or emergent issues, a gastroenterologist can be reached at any hour by calling [phone]. Do not use MyChart messaging for urgent concerns.    DIET:  We do recommend a small meal at first, but then you may proceed to your regular diet.  Drink plenty of fluids but you should avoid alcoholic beverages for 24 hours.  ACTIVITY:  You should plan to take it easy for the rest of today and you should NOT DRIVE or use heavy machinery until tomorrow (because of the sedation medicines used during the test).    FOLLOW UP: Our staff will call the number listed on your records 48-72 hours following your procedure to check on you and address any questions or concerns that you may have regarding the information given to you following your procedure. If we do not reach you, we will leave a message.  We will attempt to reach you two times.  During this call, we will ask if you have developed any symptoms of COVID 19. If you develop any symptoms (ie: fever, flu-like symptoms, shortness of breath, cough etc.) before then, please call [phone].  If you test positive for Covid 19 in the 2 weeks post procedure, please call and report this information to [name].    If any biopsies were taken you will be contacted by phone or by letter within the next 1-3 weeks.  Please call us at [phone] if you have not heard  about the biopsies in 3 weeks.    SIGNATURES/CONFIDENTIALITY: You and/or your care partner have signed paperwork which will be entered into your electronic medical record.  These signatures attest to the fact that that the information above on your After Visit Summary has been reviewed and is understood.  Full responsibility of the confidentiality of this discharge information lies with you and/or your care-partner.

## 2019-08-14 NOTE — Progress Notes (Signed)
PT taken to PACU. Monitors in place. VSS. Report given to RN. 

## 2019-08-14 NOTE — Progress Notes (Signed)
Called to room to assist during endoscopic procedure.  Patient ID and intended procedure confirmed with present staff. Received instructions for my participation in the procedure from the performing physician.  

## 2019-08-16 ENCOUNTER — Telehealth: Payer: Self-pay | Admitting: *Deleted

## 2019-08-16 NOTE — Telephone Encounter (Signed)
  Follow up Call-  Call back number 08/14/2019 10/24/2018  Post procedure Call Back phone  # [phone]  Permission to leave phone message Yes -  Some recent data might be hidden     Patient questions:  Do you have a fever, pain , or abdominal swelling? No. Pain Score  0 *  Have you tolerated food without any problems? Yes.    Have you been able to return to your normal activities? Yes.    Do you have any questions about your discharge instructions: Diet   No. Medications  No. Follow up visit  No.  Do you have questions or concerns about your Care? No.  Actions: * If pain score is 4 or above: No action needed, pain <4.  1. Have you developed a fever since your procedure? no  2.   Have you had an respiratory symptoms (SOB or cough) since your procedure? no  3.   Have you tested positive for COVID 19 since your procedure no  4.   Have you had any family members/close contacts diagnosed with the COVID 19 since your procedure?  no   If yes to any of these questions please route to Joylene John, RN and Erenest Rasher, RN

## 2019-08-19 ENCOUNTER — Other Ambulatory Visit: Payer: Self-pay

## 2019-08-19 ENCOUNTER — Other Ambulatory Visit: Payer: Self-pay | Admitting: Neurology

## 2019-09-04 ENCOUNTER — Ambulatory Visit (INDEPENDENT_AMBULATORY_CARE_PROVIDER_SITE_OTHER): Payer: Medicare HMO | Admitting: Family Medicine

## 2019-09-04 ENCOUNTER — Other Ambulatory Visit: Payer: Self-pay | Admitting: Family Medicine

## 2019-09-04 ENCOUNTER — Other Ambulatory Visit: Payer: Self-pay

## 2019-09-04 ENCOUNTER — Encounter: Payer: Self-pay | Admitting: Family Medicine

## 2019-09-04 VITALS — BP 108/70 | HR 54 | Temp 97.3°F | Resp 18 | Ht <= 58 in | Wt 125.4 lb

## 2019-09-04 DIAGNOSIS — N764 Abscess of vulva: Secondary | ICD-10-CM

## 2019-09-04 DIAGNOSIS — R350 Frequency of micturition: Secondary | ICD-10-CM

## 2019-09-04 LAB — POC URINALSYSI DIPSTICK (AUTOMATED)
Bilirubin, UA: NEGATIVE
Blood, UA: NEGATIVE
Glucose, UA: NEGATIVE
Ketones, UA: NEGATIVE
Leukocytes, UA: NEGATIVE
Nitrite, UA: NEGATIVE
Protein, UA: NEGATIVE
Spec Grav, UA: 1.01 (ref 1.010–1.025)
Urobilinogen, UA: 0.2 E.U./dL
pH, UA: 6 (ref 5.0–8.0)

## 2019-09-04 MED ORDER — CEFTRIAXONE SODIUM 1 G IJ SOLR
1.0000 g | Freq: Once | INTRAMUSCULAR | Status: AC
  Administered 2019-09-04: 15:00:00 1 g via INTRAMUSCULAR

## 2019-09-04 MED ORDER — CEPHALEXIN 500 MG PO CAPS: 500.0000 mg | ORAL_CAPSULE | Freq: Two times a day (BID) | ORAL | 0 refills | Status: DC

## 2019-09-04 MED ORDER — CEFTRIAXONE SODIUM 1 G IJ SOLR: 1.0000 g | Freq: Once | INTRAMUSCULAR | 0 refills | Status: DC

## 2019-09-04 NOTE — Progress Notes (Signed)
Patient ID: Tykeia Rodenbeck, female    DOB: [birth year]  Age: 79 y.o. MRN: [id number]    Subjective:  Subjective  HPI Nykole Beil presents for cyst on labia for a few weeks---  She put topical abx on it and it helped but it is not gone and is painful She would also like her urine checked for uti---- due to frequency   Review of Systems  Constitutional: Negative for appetite change, diaphoresis, fatigue and unexpected weight change.  Eyes: Negative for pain, redness and visual disturbance.  Respiratory: Negative for cough, chest tightness, shortness of breath and wheezing.   Cardiovascular: Negative for chest pain, palpitations and leg swelling.  Endocrine: Negative for cold intolerance, heat intolerance, polydipsia, polyphagia and polyuria.  Genitourinary: Negative for difficulty urinating, dysuria and frequency.  Neurological: Negative for dizziness, light-headedness, numbness and headaches.    History Past Medical History:  Diagnosis Date  . Anemia   . Anginal pain ([place])   . Anxiety   . Arthritis   . Asthma   . Blood transfusion without reported diagnosis   . Breast cancer ([place])    2002, left, encapsulated, microcalcifications. lumpectomy, radtiation x 30  . Breast cancer in female [facility])   . Change in mole 06/08/2016  . CHF (congestive heart failure) ([place])   . Colitis   . Complication of anesthesia    VERY SENSITIVE TO ANESTHESIA   . Decreased visual acuity 01/16/2017  . Dyspnea 11/04/2016   with asthma exacerbation only  . Dysuria 11/04/2016  . Gluten intolerance   . History of chicken pox   . History of Helicobacter pylori infection 08/01/2015  . Hyperlipidemia   . Hypertension   . Hypothyroidism   . IBS (irritable bowel syndrome)   . Left leg pain 03/01/2017  . Low back pain 08/10/2015  . 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 Neck pain 03/01/2017  . Osteoporosis   . Personal history of radiation therapy   . Pneumonia   . PONV  (postoperative nausea and vomiting)   . RLQ discomfort 03/01/2017  . Stomach cramps   . 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   Her family history includes Arthritis in her daughter; Colitis in her mother; Colon cancer in her paternal grandmother; Heart attack in her brother, brother, and brother; Heart disease in her daughter; Hyperlipidemia in her brother, brother, brother, and sister; Hypertension in her father; Irritable bowel syndrome in her mother; Leukemia in her daughter; Stomach cancer (age of onset: 29) in her paternal grandmother.She reports that she has never smoked. She has never used smokeless tobacco. She reports that she does not drink alcohol or use drugs.  Current Outpatient Medications on File Prior to Visit  Medication Sig Dispense Refill  . albuterol (VENTOLIN HFA) 108 (90 Base) MCG/ACT inhaler Inhale 2 puffs into the lungs every 6 (six) hours as needed for wheezing or shortness of breath. 18 g 2  . ASPIRIN 81 PO 81 mg 2 (two) times daily.    Marland Kitchen atorvastatin (LIPITOR) 20 MG tablet Take 1 tablet (20 mg total) by mouth daily. 90 tablet 3  . citalopram (CELEXA) 20 MG tablet Take 1 tablet (20 mg total) by mouth at bedtime. 90 tablet 1  . dicyclomine (BENTYL) 10 MG  capsule Take 1 capsule (10 mg total) by mouth every 8 (eight) hours as needed for spasms. 30 capsule 1  . famotidine (PEPCID) 40 MG tablet Take 1 tablet (40 mg total) by mouth daily. 30 tablet 3  . fluticasone (FLOVENT HFA) 110 MCG/ACT inhaler Inhale 1 puff into the lungs 2 (two) times daily. 1 Inhaler 12  . furosemide (LASIX) 40 MG tablet Take 1 tablet (40 mg total) by mouth daily. 90 tablet 1  . hyoscyamine (LEVSIN SL)  0.125 MG SL tablet Place 1 tablet (0.125 mg total) under the tongue in the morning, at noon, and at bedtime. 90 tablet 3  . levothyroxine (SYNTHROID) 50 MCG tablet Take 1 tablet by mouth once daily 90 tablet 1  . metoprolol succinate (TOPROL-XL) 25 MG 24 hr tablet Take 1 tablet (25 mg total) by mouth at bedtime. 90 tablet 1  . omega-3 acid ethyl esters (LOVAZA) 1 g capsule Take 2 g by mouth daily.    Marland Kitchen omeprazole (PRILOSEC) 40 MG capsule Take 1 tablet by mouth once to twice daily 60 capsule 5  . ondansetron (ZOFRAN) 4 MG tablet Take 1 tablet (4 mg total) by mouth every 8 (eight) hours as needed for nausea or vomiting. 30 tablet 1  . potassium chloride (KLOR-CON) 10 MEQ tablet Take 1 tablet (10 mEq total) by mouth daily. 90 tablet 1  . pramipexole (MIRAPEX) 0.125 MG tablet Take 1 tablet (0.125 mg total) by mouth 2 (two) times daily. 60 tablet 1  . primidone (MYSOLINE) 50 MG tablet TAKE 1 TABLET BY MOUTH AT BEDTIME 90 tablet 3  . sucralfate (CARAFATE) 1 g tablet Take 1 tablet (1 g total) by mouth 2 (two) times daily. 60 tablet 1  . ferrous sulfate (FERROUSUL) 325 (65 FE) MG tablet Take 1 tablet (325 mg total) by mouth 3 (three) times daily with meals for 14 days. 42 tablet 0   No current facility-administered medications on file prior to visit.     Objective:  Objective  Physical Exam Vitals and nursing note reviewed.  Constitutional:      Appearance: She is well-developed.  HENT:     Head: Normocephalic and atraumatic.  Eyes:     Conjunctiva/sclera: Conjunctivae normal.  Neck:     Thyroid: No thyromegaly.     Vascular: No carotid bruit or JVD.  Cardiovascular:     Rate and Rhythm: Normal rate and regular rhythm.     Heart sounds: Normal heart sounds. No murmur.  Pulmonary:     Effort: Pulmonary effort is normal. No respiratory distress.     Breath sounds: Normal breath sounds. No wheezing or rales.  Chest:     Chest wall: No tenderness.  Genitourinary:   Musculoskeletal:      Cervical back: Normal range of motion and neck supple.  Neurological:     Mental Status: She is alert and oriented to person, place, and time.    BP 108/70 (BP Location: Right Arm, Patient Position: Sitting, Cuff Size: Normal)   Pulse (!) 54   Temp (!) 97.3 F (36.3 C) (Temporal)   Resp 18   Ht 4\' 10"  (1.473 m)   Wt 125 lb 6.4 oz (56.9 kg)   SpO2 95%   BMI 26.21 kg/m  Wt Readings from Last 3 Encounters:  09/04/19 125 lb 6.4 oz (56.9 kg)  08/14/19 127 lb (57.6 kg)  08/09/19 127 lb (57.6 kg)     Lab Results  Component Value Date   WBC 5.2 06/04/2019  HGB 11.7 (L) 06/04/2019   HCT 34.8 (L) 06/04/2019   PLT 219.0 06/04/2019   GLUCOSE 109 (H) 06/04/2019   CHOL 165 05/03/2019   TRIG 131.0 05/03/2019   HDL 72.70 05/03/2019   LDLCALC 66 05/03/2019   ALT 15 06/04/2019   AST 22 06/04/2019   NA 135 06/04/2019   K 4.1 06/04/2019   CL 99 06/04/2019   CREATININE 1.26 (H) 06/04/2019   BUN 26 (H) 06/04/2019   CO2 27 06/04/2019   TSH 1.56 05/03/2019   INR 1.2 02/16/2019   HGBA1C 5.9 05/03/2019    CT Abdomen Pelvis W Contrast  Result Date: 06/06/2019 CLINICAL DATA:  Abdominal pain with rectal bleeding. EXAM: CT ABDOMEN AND PELVIS WITH CONTRAST TECHNIQUE: Multidetector CT imaging of the abdomen and pelvis was performed using the standard protocol following bolus administration of intravenous contrast. CONTRAST:  29mL OMNIPAQUE IOHEXOL 300 MG/ML  SOLN COMPARISON:  CT stone study 12/23/2017 FINDINGS: Lower chest: Unremarkable. Hepatobiliary: No suspicious focal abnormality within the liver parenchyma. Gallbladder surgically absent. No intrahepatic or extrahepatic biliary dilation. Pancreas: No focal mass lesion. No dilatation of the main duct. No intraparenchymal cyst. No peripancreatic edema. Spleen: No splenomegaly. No focal mass lesion. Adrenals/Urinary Tract: No adrenal nodule or mass. Kidneys unremarkable. Visualized portions of the upper ureters are unremarkable. Distal ureters and  bladder largely obscured by beam hardening artifact from left hip replacement. Stomach/Bowel: Moderate hiatal hernia with approximately 25-50% of the stomach in the chest. Duodenum is normally positioned as is the ligament of Treitz. No small bowel wall thickening. No small bowel dilatation. The terminal ileum is normal. The appendix is not visualized, but there is no edema or inflammation in the region of the cecum. No gross colonic mass. No colonic wall thickening. Diverticular changes are noted in the left colon without evidence of diverticulitis. Vascular/Lymphatic: There is abdominal aortic atherosclerosis without aneurysm. There is no gastrohepatic or hepatoduodenal ligament lymphadenopathy. No retroperitoneal or mesenteric lymphadenopathy. No pelvic sidewall lymphadenopathy. Reproductive: Uterus is apparently surgically absent although central pelvis is largely obscured. No adnexal mass within the limitation of the artifact. Other: No intraperitoneal free fluid. Musculoskeletal: Status post left hip replacement. No worrisome lytic or sclerotic osseous abnormality. IMPRESSION: 1. No acute findings in the abdomen or pelvis. 2. Moderate hiatal hernia with 25-50% of the stomach contained in the chest. 3. Left colonic diverticulosis without diverticulitis. 4.  Aortic Atherosclerois (ICD10-170.0) Electronically Signed   By: Misty Stanley M.D.   On: 06/06/2019 16:32     Assessment & Plan:  Plan  I am having Mariam Schmeiser. Burda start on cephALEXin. I am also having her maintain her fluticasone, omega-3 acid ethyl esters, atorvastatin, ferrous sulfate, ondansetron, albuterol, furosemide, citalopram, ASPIRIN 81 PO, potassium chloride, pramipexole, sucralfate, famotidine, metoprolol succinate, hyoscyamine, omeprazole, levothyroxine, dicyclomine, primidone, and lisinopril. We administered cefTRIAXone.  Meds ordered this encounter  Medications  . cephALEXin (KEFLEX) 500 MG capsule    Sig: Take 1 capsule (500 mg  total) by mouth 2 (two) times daily.    Dispense:  20 capsule    Refill:  0  . DISCONTD: cefTRIAXone (ROCEPHIN) 1 g injection    Sig: Inject 1 g into the muscle once for 1 dose.    Dispense:  10 mL    Refill:  0  . cefTRIAXone (ROCEPHIN) injection 1 g    Problem List Items Addressed This Visit      Unprioritized   Urinary frequency   Relevant Orders   POCT Urinalysis Dipstick (Automated) (Completed)  Other Visit Diagnoses    Abscess of left genital labia    -  Primary   Relevant Medications   cephALEXin (KEFLEX) 500 MG capsule   cefTRIAXone (ROCEPHIN) injection 1 g (Completed)   Other Relevant Orders   Ambulatory referral to Obstetrics / Gynecology    warm bath with epson salt  Refer to gyn Follow-up: No follow-ups on file.  Ann Held, DO

## 2019-09-14 ENCOUNTER — Other Ambulatory Visit: Payer: Self-pay | Admitting: Family Medicine

## 2019-09-18 ENCOUNTER — Ambulatory Visit (INDEPENDENT_AMBULATORY_CARE_PROVIDER_SITE_OTHER): Payer: Medicare HMO | Admitting: Family Medicine

## 2019-09-18 ENCOUNTER — Other Ambulatory Visit: Payer: Self-pay

## 2019-09-18 VITALS — BP 106/58 | HR 53 | Temp 97.3°F | Resp 12 | Ht <= 58 in | Wt 123.6 lb

## 2019-09-18 DIAGNOSIS — D649 Anemia, unspecified: Secondary | ICD-10-CM

## 2019-09-18 DIAGNOSIS — M810 Age-related osteoporosis without current pathological fracture: Secondary | ICD-10-CM

## 2019-09-18 DIAGNOSIS — E782 Mixed hyperlipidemia: Secondary | ICD-10-CM

## 2019-09-18 DIAGNOSIS — R739 Hyperglycemia, unspecified: Secondary | ICD-10-CM

## 2019-09-18 DIAGNOSIS — I10 Essential (primary) hypertension: Secondary | ICD-10-CM | POA: Diagnosis not present

## 2019-09-18 DIAGNOSIS — R109 Unspecified abdominal pain: Secondary | ICD-10-CM | POA: Diagnosis not present

## 2019-09-18 DIAGNOSIS — E559 Vitamin D deficiency, unspecified: Secondary | ICD-10-CM

## 2019-09-18 DIAGNOSIS — E039 Hypothyroidism, unspecified: Secondary | ICD-10-CM | POA: Diagnosis not present

## 2019-09-18 DIAGNOSIS — M1990 Unspecified osteoarthritis, unspecified site: Secondary | ICD-10-CM | POA: Diagnosis not present

## 2019-09-18 LAB — LIPID PANEL
Cholesterol: 151 mg/dL (ref 0–200)
HDL: 58 mg/dL
LDL Cholesterol: 74 mg/dL (ref 0–99)
NonHDL: 92.6
Total CHOL/HDL Ratio: 3
Triglycerides: 92 mg/dL (ref 0.0–149.0)
VLDL: 18.4 mg/dL (ref 0.0–40.0)

## 2019-09-18 LAB — COMPREHENSIVE METABOLIC PANEL
ALT: 14 U/L (ref 0–35)
AST: 20 U/L (ref 0–37)
Albumin: 4.2 g/dL (ref 3.5–5.2)
Alkaline Phosphatase: 72 U/L (ref 39–117)
BUN: 34 mg/dL — ABNORMAL HIGH (ref 6–23)
CO2: 28 mEq/L (ref 19–32)
Calcium: 9 mg/dL (ref 8.4–10.5)
Chloride: 101 mEq/L (ref 96–112)
Creatinine, Ser: 1.08 mg/dL (ref 0.40–1.20)
GFR: 49.09 mL/min — ABNORMAL LOW (ref >60.00)
Glucose, Bld: 80 mg/dL (ref 70–99)
Potassium: 4.2 mEq/L (ref 3.5–5.1)
Sodium: 136 mEq/L (ref 135–145)
Total Bilirubin: 0.5 mg/dL (ref 0.2–1.2)
Total Protein: 6.8 g/dL (ref 6.0–8.3)

## 2019-09-18 LAB — TSH: TSH: 1.84 u[IU]/mL (ref 0.35–4.50)

## 2019-09-18 LAB — CBC
HCT: 32.9 % — ABNORMAL LOW (ref 36.0–46.0)
Hemoglobin: 11 g/dL — ABNORMAL LOW (ref 12.0–15.0)
MCHC: 33.6 g/dL (ref 30.0–36.0)
MCV: 89.6 fl (ref 78.0–100.0)
Platelets: 210 10*3/uL (ref 150.0–400.0)
RBC: 3.67 Mil/uL — ABNORMAL LOW (ref 3.87–5.11)
RDW: 14.5 % (ref 11.5–15.5)
WBC: 5 10*3/uL (ref 4.0–10.5)

## 2019-09-18 LAB — VITAMIN D 25 HYDROXY (VIT D DEFICIENCY, FRACTURES): VITD: 56.66 ng/mL (ref 30.00–100.00)

## 2019-09-18 LAB — HEMOGLOBIN A1C: Hgb A1c MFr Bld: 6 % (ref 4.6–6.5)

## 2019-09-18 MED ORDER — FLUTICASONE PROPIONATE 50 MCG/ACT NA SUSP
2.0000 | Freq: Every day | NASAL | 5 refills | Status: DC | PRN
Start: 2019-09-18 — End: 2021-03-16

## 2019-09-18 MED ORDER — CETIRIZINE HCL 10 MG PO TABS
10.0000 mg | ORAL_TABLET | Freq: Every day | ORAL | 5 refills | Status: DC | PRN
Start: 2019-09-18 — End: 2019-11-29

## 2019-09-18 NOTE — Patient Instructions (Signed)
Arthritis °Arthritis is a term that is commonly used to refer to joint pain or joint disease. There are more than 100 types of arthritis. °What are the causes? °The most common cause of this condition is wear and tear of a joint. Other causes include: °· Gout. °· Inflammation of a joint. °· An infection of a joint. °· Sprains and other injuries near the joint. °· A reaction to medicines or drugs, or an allergic reaction. °In some cases, the cause may not be known. °What are the signs or symptoms? °The main symptom of this condition is pain in the joint during movement. Other symptoms include: °· Redness, swelling, or stiffness at a joint. °· Warmth coming from the joint. °· Fever. °· Overall feeling of illness. °How is this diagnosed? °This condition may be diagnosed with a physical exam and tests, including: °· Blood tests. °· Urine tests. °· Imaging tests, such as X-rays, an MRI, or a CT scan. °Sometimes, fluid is removed from a joint for testing. °How is this treated? °This condition may be treated with: °· Treatment of the cause, if it is known. °· Rest. °· Raising (elevating) the joint. °· Applying cold or hot packs to the joint. °· Medicines to improve symptoms and reduce inflammation. °· Injections of a steroid such as cortisone into the joint to help reduce pain and inflammation. °Depending on the cause of your arthritis, you may need to make lifestyle changes to reduce stress on your joint. Changes may include: °· Exercising more. °· Losing weight. °Follow these instructions at home: °Medicines °· Take over-the-counter and prescription medicines only as told by your health care provider. °· Do not take aspirin to relieve pain if your health care provider thinks that gout may be causing your pain. °Activity °· Rest your joint if told by your health care provider. Rest is important when your disease is active and your joint feels painful, swollen, or stiff. °· Avoid activities that make the pain worse. It is  important to balance activity with rest. °· Exercise your joint regularly with range-of-motion exercises as told by your health care provider. Try doing low-impact exercise, such as: °? Swimming. °? Water aerobics. °? Biking. °? Walking. °Managing pain, stiffness, and swelling ° °  ° °· If directed, put ice on the joint. °? Put ice in a plastic bag. °? Place a towel between your skin and the bag. °? Leave the ice on for 20 minutes, 2-3 times per day. °· If your joint is swollen, raise (elevate) it above the level of your heart if directed by your health care provider. °· If your joint feels stiff in the morning, try taking a warm shower. °· If directed, apply heat to the affected area as often as told by your health care provider. Use the heat source that your health care provider recommends, such as a moist heat pack or a heating pad. If you have diabetes, do not apply heat without permission from your health care provider. To apply heat: °? Place a towel between your skin and the heat source. °? Leave the heat on for 20-30 minutes. °? Remove the heat if your skin turns bright red. This is especially important if you are unable to feel pain, heat, or cold. You may have a greater risk of getting burned. °General instructions °· Do not use any products that contain nicotine or tobacco, such as cigarettes, e-cigarettes, and chewing tobacco. If you need help quitting, ask your health care provider. °· Keep   all follow-up visits as told by your health care provider. This is important. °Contact a health care provider if: °· The pain gets worse. °· You have a fever. °Get help right away if: °· You develop severe joint pain, swelling, or redness. °· Many joints become painful and swollen. °· You develop severe back pain. °· You develop severe weakness in your leg. °· You cannot control your bladder or bowels. °Summary °· Arthritis is a term that is commonly used to refer to joint pain or joint disease. There are more than  100 types of arthritis. °· The most common cause of this condition is wear and tear of a joint. Other causes include gout, inflammation or infection of the joint, sprains, or allergies. °· Symptoms of this condition include redness, swelling, or stiffness of the joint. Other symptoms include warmth, fever, or feeling ill. °· This condition is treated with rest, elevation, medicines, and applying cold or hot packs. °· Follow your health care provider's instructions about medicines, activity, exercises, and other home care treatments. °This information is not intended to replace advice given to you by your health care provider. Make sure you discuss any questions you have with your health care provider. °Document Revised: 04/17/2018 Document Reviewed: 04/17/2018 °[employer]vier Patient Education © 2020 [employer] ° °

## 2019-09-19 ENCOUNTER — Other Ambulatory Visit: Payer: Self-pay | Admitting: Family Medicine

## 2019-09-19 DIAGNOSIS — M1990 Unspecified osteoarthritis, unspecified site: Secondary | ICD-10-CM | POA: Insufficient documentation

## 2019-09-19 DIAGNOSIS — R768 Other specified abnormal immunological findings in serum: Secondary | ICD-10-CM

## 2019-09-19 NOTE — Assessment & Plan Note (Signed)
Supplement and monitor 

## 2019-09-19 NOTE — Assessment & Plan Note (Signed)
On Levothyroxine, continue to monitor 

## 2019-09-19 NOTE — Assessment & Plan Note (Signed)
Encouraged heart healthy diet, increase exercise, avoid trans fats, consider a krill oil cap daily 

## 2019-09-19 NOTE — Assessment & Plan Note (Signed)
Improved with treatment and evaluation by gastroenterology. No further changes today

## 2019-09-19 NOTE — Assessment & Plan Note (Signed)
Well controlled, no changes to meds. Encouraged heart healthy diet such as the DASH diet and exercise as tolerated.  °

## 2019-09-19 NOTE — Progress Notes (Signed)
Subjective:    Patient ID: Julie Wilkerson, female    DOB: [birth year], 79 y.o.   MRN: [id number]  Chief Complaint  Patient presents with  . 3 week follow up    HPI Patient is in today for follow up on chronic medical concerns. No recent febrile illness or hospitalizations. She has been scoped by gastroenterology and started on new medication and she feels much better. Some abdominal pain and nausea are noted but it is much better. No new illness but she is noting increased joint pain and low back pain. Is noting her recent injection in her back was not helpful. Denies CP/palp/SOB/HA/congestion/fevers or GU c/o. Taking meds as prescribed  Past Medical History:  Diagnosis Date  . Anemia   . Anginal pain ([place])   . Anxiety   . Arthritis   . Asthma   . Blood transfusion without reported diagnosis   . Breast cancer ([place])    2002, left, encapsulated, microcalcifications. lumpectomy, radtiation x 30  . Breast cancer in female [facility])   . Change in mole 06/08/2016  . CHF (congestive heart failure) ([place])   . Colitis   . Complication of anesthesia    VERY SENSITIVE TO ANESTHESIA   . Decreased visual acuity 01/16/2017  . Dyspnea 11/04/2016   with asthma exacerbation only  . Dysuria 11/04/2016  . Gluten intolerance   . History of chicken pox   . History of Helicobacter pylori infection 08/01/2015  . Hyperlipidemia   . Hypertension   . Hypothyroidism   . IBS (irritable bowel syndrome)   . Left leg pain 03/01/2017  . Low back pain 08/10/2015  . 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 Neck pain 03/01/2017  . Osteoporosis   . Personal history of radiation therapy   . Pneumonia   . PONV (postoperative nausea and vomiting)   . RLQ discomfort 03/01/2017  . Stomach cramps   . Urinary tract infection 11/04/2016    Past Surgical History:  Procedure Laterality Date  . ABDOMINAL HYSTERECTOMY     partial  . APPENDECTOMY    . BREAST LUMPECTOMY Left 2002  . CHOLECYSTECTOMY   Age 49 or 89  . COLONOSCOPY  2017  . EYE SURGERY Left    cataract  . LEFT HEART CATH AND CORONARY ANGIOGRAPHY N/A 02/09/2019   Procedure: LEFT HEART CATH AND CORONARY ANGIOGRAPHY;  Surgeon: Nelva Bush, MD;  Location: [facility];  Service: Cardiovascular;  Laterality: N/A;  . TONSILLECTOMY    . 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 Colitis Mother   . Irritable bowel syndrome Mother   . Hypertension Father   . Hyperlipidemia Brother   . Heart attack Brother   . Stomach cancer Paternal Grandmother 73  . Colon cancer Paternal Grandmother   . Hyperlipidemia Brother   . Heart attack Brother   . Hyperlipidemia Brother   . Heart attack Brother   . Hyperlipidemia Sister   . Leukemia Daughter   . Arthritis Daughter   . Heart disease Daughter        ASD vs VSD    Social History   Socioeconomic History  . Marital status: Single    Spouse name: Not on file  . Number of children: 3  . Years of education: 85  .  Highest education level: Master's degree (e.g., MA, MS, MEng, MEd, MSW, MBA)  Occupational History  . Occupation: retired    Comment: Pharmacist, hospital, kindergarten  Tobacco Use  . Smoking status: Never Smoker  . Smokeless tobacco: Never Used  Substance and Sexual Activity  . Alcohol use: Never    Alcohol/week: 0.0 standard drinks  . Drug use: Never  . Sexual activity: Not Currently    Birth control/protection: None    Comment: lives alone, avoids daiiry and gluten. 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 Not on file  Social History Narrative  . Not on file   Social Determinants of Health   Financial Resource Strain:   . Difficulty of Paying Living Expenses:   Food Insecurity:   . Worried About Charity fundraiser in the Last Year:   . Academic librarian in the Last Year:   Transportation Needs:   . Film/video editor (Medical):   Marland Kitchen Lack of Transportation (Non-Medical):   Physical Activity:   . Days of Exercise per Week:   . Minutes of Exercise per Session:   Stress:   . Feeling of Stress :   Social Connections:   . Frequency of Communication with Friends and Family:   . Frequency of Social Gatherings with Friends and Family:   . Attends Religious Services:   . Active Member of Clubs or Organizations:   . Attends Archivist Meetings:   Marland Kitchen Marital Status:   Intimate Partner Violence:   . Fear of Current or Ex-Partner:   . Emotionally Abused:   Marland Kitchen Physically Abused:   . Sexually Abused:     Outpatient Medications Prior to Visit  Medication Sig Dispense Refill  . albuterol (VENTOLIN HFA) 108 (90 Base) MCG/ACT inhaler Inhale 2 puffs into the lungs every 6 (six) hours as needed for wheezing or shortness of breath. 18 g 2  . ASPIRIN 81 PO 81 mg 2 (two) times daily.    Marland Kitchen atorvastatin (LIPITOR) 20 MG tablet Take 1 tablet (20 mg total) by mouth daily. 90 tablet 3  . citalopram (CELEXA) 20 MG tablet Take 1 tablet (20 mg total) by mouth at bedtime. 90 tablet 1  . dicyclomine (BENTYL) 10 MG capsule Take 1 capsule (10 mg total) by mouth every 8 (eight) hours as needed for spasms. 30 capsule 1  . fluticasone (FLOVENT HFA) 110 MCG/ACT inhaler Inhale 1 puff into the lungs 2 (two) times daily. 1 Inhaler 12  . furosemide (LASIX) 40 MG tablet Take 1 tablet by mouth once daily 90 tablet 0  . hyoscyamine (LEVSIN SL) 0.125 MG SL tablet Place 1 tablet (0.125 mg total) under the tongue in the morning, at noon, and at bedtime. 90 tablet 3  . levothyroxine (SYNTHROID) 50 MCG tablet Take 1 tablet by mouth once daily 90 tablet 1  . lisinopril (ZESTRIL) 5 MG tablet Take 1 tablet by mouth twice daily 60 tablet 0  . metoprolol succinate (TOPROL-XL) 25 MG 24 hr tablet Take 1 tablet (25 mg total) by mouth at bedtime. 90 tablet 1  . omega-3 acid  ethyl esters (LOVAZA) 1 g capsule Take 2 g by mouth daily.    Marland Kitchen omeprazole (PRILOSEC) 40 MG capsule Take 1 tablet by mouth once to twice daily 60 capsule 5  . ondansetron (ZOFRAN) 4 MG tablet Take 1 tablet (4 mg total) by mouth every 8 (eight) hours as needed for nausea or vomiting. 30 tablet 1  . potassium chloride (KLOR-CON)  10 MEQ tablet Take 1 tablet (10 mEq total) by mouth daily. 90 tablet 1  . pramipexole (MIRAPEX) 0.125 MG tablet Take 1 tablet (0.125 mg total) by mouth 2 (two) times daily. 60 tablet 1  . primidone (MYSOLINE) 50 MG tablet TAKE 1 TABLET BY MOUTH AT BEDTIME 90 tablet 3  . sucralfate (CARAFATE) 1 g tablet Take 1 tablet (1 g total) by mouth 2 (two) times daily. 60 tablet 1  . ferrous sulfate (FERROUSUL) 325 (65 FE) MG tablet Take 1 tablet (325 mg total) by mouth 3 (three) times daily with meals for 14 days. 42 tablet 0  . cephALEXin (KEFLEX) 500 MG capsule Take 1 capsule (500 mg total) by mouth 2 (two) times daily. 20 capsule 0  . famotidine (PEPCID) 40 MG tablet Take 1 tablet (40 mg total) by mouth daily. 30 tablet 3   No facility-administered medications prior to visit.    No Known Allergies  Review of Systems  Constitutional: Positive for malaise/fatigue. Negative for fever.  HENT: Negative for congestion.   Eyes: Negative for blurred vision.  Respiratory: Negative for shortness of breath.   Cardiovascular: Negative for chest pain, palpitations and leg swelling.  Gastrointestinal: Positive for abdominal pain. Negative for blood in stool and nausea.  Genitourinary: Negative for dysuria and frequency.  Musculoskeletal: Positive for back pain, joint pain and myalgias. Negative for falls.  Skin: Negative for rash.  Neurological: Negative for dizziness, loss of consciousness and headaches.  Endo/Heme/Allergies: Negative for environmental allergies.  Psychiatric/Behavioral: Negative for depression. The patient is not nervous/anxious.        Objective:    Physical  Exam Vitals and nursing note reviewed.  Constitutional:      General: She is not in acute distress.    Appearance: She is well-developed.  HENT:     Head: Normocephalic and atraumatic.     Nose: Nose normal.  Eyes:     General:        Right eye: No discharge.        Left eye: No discharge.  Cardiovascular:     Rate and Rhythm: Normal rate and regular rhythm.     Heart sounds: No murmur.  Pulmonary:     Effort: Pulmonary effort is normal.     Breath sounds: Normal breath sounds.  Abdominal:     General: Bowel sounds are normal.     Palpations: Abdomen is soft.     Tenderness: There is no abdominal tenderness.  Musculoskeletal:     Cervical back: Normal range of motion and neck supple.  Skin:    General: Skin is warm and dry.  Neurological:     Mental Status: She is alert and oriented to person, place, and time.     BP (!) 106/58 (BP Location: Left Arm, Cuff Size: Normal)   Pulse (!) 53   Temp (!) 97.3 F (36.3 C) (Temporal)   Resp 12   Ht 4\' 10"  (1.473 m)   Wt 123 lb 9.6 oz (56.1 kg)   SpO2 93%   BMI 25.83 kg/m  Wt Readings from Last 3 Encounters:  09/18/19 123 lb 9.6 oz (56.1 kg)  09/04/19 125 lb 6.4 oz (56.9 kg)  08/14/19 127 lb (57.6 kg)    Diabetic Foot Exam - Simple   No data filed     Lab Results  Component Value Date   WBC 5.0 09/18/2019   HGB 11.0 (L) 09/18/2019   HCT 32.9 (L) 09/18/2019   PLT 210.0 09/18/2019  GLUCOSE 80 09/18/2019   CHOL 151 09/18/2019   TRIG 92.0 09/18/2019   HDL 58.00 09/18/2019   LDLCALC 74 09/18/2019   ALT 14 09/18/2019   AST 20 09/18/2019   NA 136 09/18/2019   K 4.2 09/18/2019   CL 101 09/18/2019   CREATININE 1.08 09/18/2019   BUN 34 (H) 09/18/2019   CO2 28 09/18/2019   TSH 1.84 09/18/2019   INR 1.2 02/16/2019   HGBA1C 6.0 09/18/2019    Lab Results  Component Value Date   TSH 1.84 09/18/2019   Lab Results  Component Value Date   WBC 5.0 09/18/2019   HGB 11.0 (L) 09/18/2019   HCT 32.9 (L) 09/18/2019    MCV 89.6 09/18/2019   PLT 210.0 09/18/2019   Lab Results  Component Value Date   NA 136 09/18/2019   K 4.2 09/18/2019   CO2 28 09/18/2019   GLUCOSE 80 09/18/2019   BUN 34 (H) 09/18/2019   CREATININE 1.08 09/18/2019   BILITOT 0.5 09/18/2019   ALKPHOS 72 09/18/2019   AST 20 09/18/2019   ALT 14 09/18/2019   PROT 6.8 09/18/2019   ALBUMIN 4.2 09/18/2019   CALCIUM 9.0 09/18/2019   ANIONGAP 11 02/24/2019   GFR 49.09 (L) 09/18/2019   Lab Results  Component Value Date   CHOL 151 09/18/2019   Lab Results  Component Value Date   HDL 58.00 09/18/2019   Lab Results  Component Value Date   LDLCALC 74 09/18/2019   Lab Results  Component Value Date   TRIG 92.0 09/18/2019   Lab Results  Component Value Date   CHOLHDL 3 09/18/2019   Lab Results  Component Value Date   HGBA1C 6.0 09/18/2019       Assessment & Plan:   Problem List Items Addressed This Visit    Essential hypertension - Primary (Chronic)    Well controlled, no changes to meds. Encouraged heart healthy diet such as the DASH diet and exercise as tolerated.       Relevant Orders   CBC (Completed)   Comprehensive metabolic panel (Completed)   TSH (Completed)   Hyperlipidemia    Encouraged heart healthy diet, increase exercise, avoid trans fats, consider a krill oil cap daily      Relevant Orders   Lipid panel (Completed)   Osteoporosis   Hypothyroid    On Levothyroxine, continue to monitor      Vitamin D deficiency    Supplement and monitor      Relevant Orders   VITAMIN D 25 Hydroxy (Vit-D Deficiency, Fractures) (Completed)   Hyperglycemia   Relevant Orders   Hemoglobin A1c (Completed)   Abdominal pain    Improved with treatment and evaluation by gastroenterology. No further changes today      Anemia    Increase leafy greens, consider increased lean red meat and using cast iron cookware. Continue to monitor, report any concerns      Arthritis    Worsening issues regarding pain. Notes pain  in hands, shoulders, hips and her back is worsening. She had steroid shots in low back with Dr Nelva Bush but she did not get relief so she is unlikely to take more of them. Check labs and consider referral to specialty care if worsens.       Relevant Orders   Rheumatoid Factor (Completed)   Antinuclear Antib (ANA)      I have discontinued Van Clines. Merle's famotidine and cephALEXin. I am also having her start on fluticasone and cetirizine. Additionally,  I am having her maintain her fluticasone, omega-3 acid ethyl esters, atorvastatin, ferrous sulfate, ondansetron, albuterol, citalopram, ASPIRIN 81 PO, potassium chloride, pramipexole, sucralfate, metoprolol succinate, hyoscyamine, omeprazole, levothyroxine, dicyclomine, primidone, lisinopril, and furosemide.  Meds ordered this encounter  Medications  . fluticasone (FLONASE) 50 MCG/ACT nasal spray    Sig: Place 2 sprays into both nostrils daily as needed for allergies or rhinitis.    Dispense:  16 g    Refill:  5  . cetirizine (ZYRTEC) 10 MG tablet    Sig: Take 1 tablet (10 mg total) by mouth daily as needed for allergies.    Dispense:  30 tablet    Refill:  5     Penni Homans, MD

## 2019-09-19 NOTE — Assessment & Plan Note (Signed)
Increase leafy greens, consider increased lean red meat and using cast iron cookware. Continue to monitor, report any concerns 

## 2019-09-19 NOTE — Assessment & Plan Note (Signed)
Worsening issues regarding pain. Notes pain in hands, shoulders, hips and her back is worsening. She had steroid shots in low back with Dr Nelva Bush but she did not get relief so she is unlikely to take more of them. Check labs and consider referral to specialty care if worsens.

## 2019-09-20 ENCOUNTER — Other Ambulatory Visit: Payer: Self-pay

## 2019-09-20 ENCOUNTER — Telehealth: Payer: Self-pay | Admitting: Family Medicine

## 2019-09-20 DIAGNOSIS — D649 Anemia, unspecified: Secondary | ICD-10-CM

## 2019-09-20 LAB — ANTI-NUCLEAR AB-TITER (ANA TITER): ANA Titer 1: 1:80 {titer} — ABNORMAL HIGH

## 2019-09-20 LAB — RHEUMATOID FACTOR: Rheumatoid fact SerPl-aCnc: 76 IU/mL — ABNORMAL HIGH (ref <14)

## 2019-09-20 LAB — ANA: Anti Nuclear Antibody (ANA): POSITIVE — AB

## 2019-09-20 NOTE — Telephone Encounter (Signed)
Patient was notified of results by Rod Holler.

## 2019-09-20 NOTE — Telephone Encounter (Signed)
Caller: Julie Wilkerson   Call Back # [phone]  Patient is returning a call from Dr Frederik Pear assist, in regards to lab results.

## 2019-09-25 ENCOUNTER — Ambulatory Visit: Payer: Medicare HMO | Admitting: Family Medicine

## 2019-09-26 DIAGNOSIS — R69 Illness, unspecified: Secondary | ICD-10-CM | POA: Diagnosis not present

## 2019-09-27 DIAGNOSIS — M5416 Radiculopathy, lumbar region: Secondary | ICD-10-CM | POA: Diagnosis not present

## 2019-09-27 DIAGNOSIS — M25552 Pain in left hip: Secondary | ICD-10-CM | POA: Diagnosis not present

## 2019-09-27 DIAGNOSIS — Z96642 Presence of left artificial hip joint: Secondary | ICD-10-CM | POA: Diagnosis not present

## 2019-10-04 DIAGNOSIS — R768 Other specified abnormal immunological findings in serum: Secondary | ICD-10-CM | POA: Diagnosis not present

## 2019-10-04 DIAGNOSIS — R5383 Other fatigue: Secondary | ICD-10-CM | POA: Diagnosis not present

## 2019-10-04 DIAGNOSIS — M7989 Other specified soft tissue disorders: Secondary | ICD-10-CM | POA: Diagnosis not present

## 2019-10-04 DIAGNOSIS — Z6823 Body mass index (BMI) 23.0-23.9, adult: Secondary | ICD-10-CM | POA: Diagnosis not present

## 2019-10-04 LAB — HEPATIC FUNCTION PANEL
ALT: 24 (ref 7–35)
AST: 29 (ref 13–35)
Alkaline Phosphatase: 93 (ref 25–125)
Bilirubin, Total: 0.5

## 2019-10-04 LAB — CBC AND DIFFERENTIAL
HCT: 36 (ref 36–46)
Hemoglobin: 11.8 — AB (ref 12.0–16.0)
Neutrophils Absolute: 3
Platelets: 233 (ref 150–399)
WBC: 6

## 2019-10-04 LAB — BASIC METABOLIC PANEL
BUN: 28 — AB (ref 4–21)
Creatinine: 1.2 — AB (ref 0.5–1.1)
Glucose: 61
Potassium: 4.5 (ref 3.4–5.3)
Sodium: 136 — AB (ref 137–147)

## 2019-10-04 LAB — CBC: RBC: 3.91 (ref 3.87–5.11)

## 2019-10-05 ENCOUNTER — Other Ambulatory Visit: Payer: Self-pay | Admitting: Family Medicine

## 2019-10-06 LAB — COMPREHENSIVE METABOLIC PANEL
Albumin: 4.8 (ref 3.5–5.0)
Calcium: 9.3 (ref 8.7–10.7)
Globulin: 2.8

## 2019-10-07 ENCOUNTER — Encounter: Payer: Self-pay | Admitting: Family Medicine

## 2019-10-09 ENCOUNTER — Encounter: Payer: Self-pay | Admitting: *Deleted

## 2019-10-16 DIAGNOSIS — M5136 Other intervertebral disc degeneration, lumbar region: Secondary | ICD-10-CM | POA: Diagnosis not present

## 2019-10-19 ENCOUNTER — Other Ambulatory Visit (INDEPENDENT_AMBULATORY_CARE_PROVIDER_SITE_OTHER): Payer: Medicare HMO

## 2019-10-19 ENCOUNTER — Other Ambulatory Visit: Payer: Self-pay

## 2019-10-19 DIAGNOSIS — D649 Anemia, unspecified: Secondary | ICD-10-CM | POA: Diagnosis not present

## 2019-10-19 LAB — CBC
HCT: 32.1 % — ABNORMAL LOW (ref 36.0–46.0)
Hemoglobin: 10.9 g/dL — ABNORMAL LOW (ref 12.0–15.0)
MCHC: 33.9 g/dL (ref 30.0–36.0)
MCV: 88.7 fl (ref 78.0–100.0)
Platelets: 191 10*3/uL (ref 150.0–400.0)
RBC: 3.61 Mil/uL — ABNORMAL LOW (ref 3.87–5.11)
RDW: 14.9 % (ref 11.5–15.5)
WBC: 5.3 10*3/uL (ref 4.0–10.5)

## 2019-10-19 LAB — IRON: Iron: 54 ug/dL (ref 42–145)

## 2019-10-19 LAB — FERRITIN: Ferritin: 39 ng/mL (ref 10.0–291.0)

## 2019-10-19 LAB — RETICULOCYTES
ABS Retic: 50960 cells/uL (ref 20000–8000)
Retic Ct Pct: 1.4 %

## 2019-10-21 ENCOUNTER — Encounter: Payer: Self-pay | Admitting: Family Medicine

## 2019-10-21 ENCOUNTER — Other Ambulatory Visit: Payer: Self-pay | Admitting: Family Medicine

## 2019-10-24 ENCOUNTER — Other Ambulatory Visit: Payer: Self-pay | Admitting: Family Medicine

## 2019-10-24 DIAGNOSIS — D649 Anemia, unspecified: Secondary | ICD-10-CM

## 2019-10-30 ENCOUNTER — Other Ambulatory Visit: Payer: Self-pay | Admitting: Family Medicine

## 2019-11-08 DIAGNOSIS — M5136 Other intervertebral disc degeneration, lumbar region: Secondary | ICD-10-CM | POA: Diagnosis not present

## 2019-11-13 ENCOUNTER — Telehealth: Payer: Self-pay | Admitting: Gastroenterology

## 2019-11-13 NOTE — Telephone Encounter (Signed)
Lm on vm for patient to return call 

## 2019-11-13 NOTE — Telephone Encounter (Signed)
Pt states that she feels very weak, pt states that she is having upper abdominal pain, pt states that she is having 4 episodes of diarrhea in the morning. Pt states that she eats some but as soon as she eats her abdominal pain returns, pt states that she is nauseas, pt states that she had vomiting on Saturday with blood in it. Pt states that her stools have been dark green, and sometimes just dark.Pt states that she has been taking all prescribed medications as directed and is still having issues. Pt advised to go to the ER given her symptoms, pt states that this has been going on for 2 weeks. Pt states that she will contact daughter to take her to ER.

## 2019-11-13 NOTE — Telephone Encounter (Signed)
Patient called and states she has abd pain and is vomiting for two weeks now

## 2019-11-16 ENCOUNTER — Ambulatory Visit (INDEPENDENT_AMBULATORY_CARE_PROVIDER_SITE_OTHER): Payer: Medicare HMO | Admitting: Medical

## 2019-11-16 ENCOUNTER — Other Ambulatory Visit: Payer: Self-pay

## 2019-11-16 ENCOUNTER — Telehealth: Payer: Self-pay | Admitting: *Deleted

## 2019-11-16 VITALS — BP 122/60 | HR 63 | Resp 18 | Ht <= 58 in | Wt 122.0 lb

## 2019-11-16 DIAGNOSIS — R195 Other fecal abnormalities: Secondary | ICD-10-CM | POA: Diagnosis not present

## 2019-11-16 DIAGNOSIS — R1013 Epigastric pain: Secondary | ICD-10-CM | POA: Diagnosis not present

## 2019-11-16 DIAGNOSIS — K589 Irritable bowel syndrome without diarrhea: Secondary | ICD-10-CM | POA: Diagnosis not present

## 2019-11-16 DIAGNOSIS — M25512 Pain in left shoulder: Secondary | ICD-10-CM

## 2019-11-16 DIAGNOSIS — G8929 Other chronic pain: Secondary | ICD-10-CM | POA: Diagnosis not present

## 2019-11-16 LAB — CBC WITH DIFFERENTIAL/PLATELET
Basophils Absolute: 0.1 10*3/uL (ref 0.0–0.1)
Basophils Relative: 1 % (ref 0.0–3.0)
Eosinophils Absolute: 0 10*3/uL (ref 0.0–0.7)
Eosinophils Relative: 0 % (ref 0.0–5.0)
HCT: 33.8 % — ABNORMAL LOW (ref 36.0–46.0)
Hemoglobin: 11.3 g/dL — ABNORMAL LOW (ref 12.0–15.0)
Lymphocytes Relative: 26.5 % (ref 12.0–46.0)
Lymphs Abs: 1.4 10*3/uL (ref 0.7–4.0)
MCHC: 33.5 g/dL (ref 30.0–36.0)
MCV: 90.2 fl (ref 78.0–100.0)
Monocytes Absolute: 0.7 10*3/uL (ref 0.1–1.0)
Monocytes Relative: 13.8 % — ABNORMAL HIGH (ref 3.0–12.0)
Neutro Abs: 3 10*3/uL (ref 1.4–7.7)
Neutrophils Relative %: 58.7 % (ref 43.0–77.0)
Platelets: 185 10*3/uL (ref 150.0–400.0)
RBC: 3.75 Mil/uL — ABNORMAL LOW (ref 3.87–5.11)
RDW: 14.9 % (ref 11.5–15.5)
WBC: 5.2 10*3/uL (ref 4.0–10.5)

## 2019-11-16 LAB — COMPREHENSIVE METABOLIC PANEL
ALT: 17 U/L (ref 0–35)
AST: 23 U/L (ref 0–37)
Albumin: 4.4 g/dL (ref 3.5–5.2)
Alkaline Phosphatase: 65 U/L (ref 39–117)
BUN: 30 mg/dL — ABNORMAL HIGH (ref 6–23)
CO2: 26 mEq/L (ref 19–32)
Calcium: 9.2 mg/dL (ref 8.4–10.5)
Chloride: 101 mEq/L (ref 96–112)
Creatinine, Ser: 1.22 mg/dL — ABNORMAL HIGH (ref 0.40–1.20)
GFR: 42.63 mL/min — ABNORMAL LOW (ref >60.00)
Glucose, Bld: 80 mg/dL (ref 70–99)
Potassium: 4.1 mEq/L (ref 3.5–5.1)
Sodium: 138 mEq/L (ref 135–145)
Total Bilirubin: 0.5 mg/dL (ref 0.2–1.2)
Total Protein: 7.2 g/dL (ref 6.0–8.3)

## 2019-11-16 LAB — LIPASE: Lipase: 34 U/L (ref 11.0–59.0)

## 2019-11-16 LAB — TROPONIN I (HIGH SENSITIVITY): High Sens Troponin I: 8 ng/L (ref 2–17)

## 2019-11-16 MED ORDER — METRONIDAZOLE 500 MG PO TABS
500.0000 mg | ORAL_TABLET | Freq: Three times a day (TID) | ORAL | 0 refills | Status: DC
Start: 2019-11-16 — End: 2019-11-20

## 2019-11-16 MED ORDER — CIPROFLOXACIN HCL 500 MG PO TABS: 500.0000 mg | ORAL_TABLET | Freq: Two times a day (BID) | ORAL | 0 refills | Status: DC

## 2019-11-16 MED ORDER — SUCRALFATE 1 G PO TABS
1.0000 g | ORAL_TABLET | Freq: Two times a day (BID) | ORAL | 0 refills | Status: DC
Start: 2019-11-16 — End: 2021-09-08

## 2019-11-16 NOTE — Progress Notes (Signed)
Subjective:    Patient ID: Julie Wilkerson, female    DOB: [birth year], 79 y.o.   MRN: [id number]  HPI   Pt in with recent of abdomen pain for 3 weeks. Pain got worse last week.  Pt called her gi office on 11/13/2019 See below.  Pt states that she feels very weak, pt states that she is having upper abdominal pain, pt states that she is having 4 episodes of diarrhea in the morning. Pt states that she eats some but as soon as she eats her abdominal pain returns, pt states that she is nauseas, pt states that she had vomiting on Saturday with blood in it. Pt states that her stools have been dark green, and sometimes just dark.Pt states that she has been taking all prescribed medications as directed and is still having issues. Pt advised to go to the ER given her symptoms, pt states that this has been going on for 2 weeks. Pt states that she will contact daughter to take her to ER.    Pt states her stools normal past 3 days but then got loose stool again this morning.  Pt tried omeprazole the other day. It did help decrease pain. Pt states hx of ibs as well. She states hycosamine does help stop loose stools.   Pt never had endoscopy.  Pt has no chest pain. No sob. No wheezing. No arm pain. Pt does have hx of chronic pain left one year. Pain does not hurt with movement.        Review of Systems  Cardiovascular: Negative for chest pain and palpitations.  Gastrointestinal: Positive for abdominal pain, diarrhea, nausea and vomiting. Negative for abdominal distention, blood in stool and rectal pain.       See hpi. At time green appearing stool. Not black or bright red.  One week ago vomited only one time. Speckled bright red appearance to vomitus. None since.  No black or bright red blood from rectum.  Hematological: Negative for adenopathy. Does not bruise/bleed easily.       Objective:   Physical Exam  General Mental Status- Alert. General Appearance- Not in acute distress.    Skin General: Color- Normal Color. Moisture- Normal Moisture.  Neck Carotid Arteries- Normal color. Moisture- Normal Moisture. No carotid bruits. No JVD.  Chest and Lung Exam Auscultation: Breath Sounds:-Normal.  Cardiovascular Auscultation:Rythm- Regular. Murmurs & Other Heart Sounds:Auscultation of the heart reveals- No Murmurs.  Abdomen Inspection:-Inspeection Normal. Palpation/Percussion:Note:No mass. Palpation and Percussion of the abdomen reveal- epigastric  Tender, Non Distended + BS, no rebound or guarding. Mild left lower quadrant tender.   Neurologic Cranial Nerve exam:- CN III-XII intact(No nystagmus), symmetric smile. Strength:- 5/5 equal and symmetric strength both upper and lower extremities.  Left shoulder- no pain on palpation. Good rom no pain.    Assessment & Plan:  Hx of gerd and on omeprazole twice daily. Add famotadine and carafate. Eat healthy.  Refer back to GI MD to get opinion if need endoscopy?  For llq pain on exam and diarrhea will rx cipro and flagyl. Possible diverticulitis.  Ekg today due to epigastric pain and chronic left shoulder pain. Ekg sinus brady possible pac(no ischemic changes). Will get one set troponin stat as we approach weekend. Note pain in shoulder chronic. Pt is following up with cardiologist next week.  Hx of ibs. Continue hycosamine.  Follow up one week or as needed.  Time spent with patient today was 40  minutes which consisted of chart review,  discussing diagnosis, work up,  Treatment referral, answering question and documentation.

## 2019-11-16 NOTE — Progress Notes (Signed)
lo

## 2019-11-16 NOTE — Telephone Encounter (Signed)
Received call from Saa @ [facility] reporting STAT Troponin HS level in normal range at 8. Normal (2-17). The Chemistry analyzer is being worked on at [facility] and lipase will be resulted as soon as that is fixed. We are unable to send that to [facility] for stat testing as [facility] will require a different tube than we use.

## 2019-11-16 NOTE — Patient Instructions (Addendum)
Hx of gerd and on omeprazole twice daily. Add famotadine and carafate. Eat healthy.  Refer back to GI MD to get opinion if need endoscopy?  For llq pain on exam and diarrhea will rx cipro and flagyl. Possible diverticulitis.  Ekg today due to epigastric pain and chronic left shoulder pain. Ekg sinus brady possible pac(no ischemic changes). Will get one set troponin stat as we approach weekend. Note pain in shoulder chronic. Pt is following up with cardiologist next week.  Hx of ibs. Continue hycosamine.  Follow up one week or as needed.

## 2019-11-20 ENCOUNTER — Encounter: Payer: Self-pay | Admitting: Gastroenterology

## 2019-11-20 ENCOUNTER — Ambulatory Visit: Payer: Medicare HMO | Admitting: Gastroenterology

## 2019-11-20 VITALS — BP 150/72 | HR 64 | Ht <= 58 in | Wt 123.0 lb

## 2019-11-20 DIAGNOSIS — K219 Gastro-esophageal reflux disease without esophagitis: Secondary | ICD-10-CM

## 2019-11-20 DIAGNOSIS — R1013 Epigastric pain: Secondary | ICD-10-CM | POA: Diagnosis not present

## 2019-11-20 DIAGNOSIS — K529 Noninfective gastroenteritis and colitis, unspecified: Secondary | ICD-10-CM | POA: Diagnosis not present

## 2019-11-20 DIAGNOSIS — K920 Hematemesis: Secondary | ICD-10-CM | POA: Diagnosis not present

## 2019-11-20 MED ORDER — COLESTIPOL HCL 1 G PO TABS: 1.0000 g | ORAL_TABLET | Freq: Two times a day (BID) | ORAL | 2 refills | Status: DC

## 2019-11-20 MED ORDER — OMEPRAZOLE 40 MG PO CPDR
40.0000 mg | DELAYED_RELEASE_CAPSULE | Freq: Two times a day (BID) | ORAL | 5 refills | Status: DC
Start: 2019-11-20 — End: 2021-02-11

## 2019-11-20 NOTE — Patient Instructions (Addendum)
If you are age 78 or older, your body mass index should be between 23-30. Your Body mass index is 25.71 kg/m. If this is out of the aforementioned range listed, please consider follow up with your Primary Care Provider.  If you are age 54 or younger, your body mass index should be between 19-25. Your Body mass index is 25.71 kg/m. If this is out of the aformentioned range listed, please consider follow up with your Primary Care Provider.   You have been scheduled for an endoscopy. Please follow written instructions given to you at your visit today. If you use inhalers (even only as needed), please bring them with you on the day of your procedure.   We have sent the following medications to your pharmacy for you to pick up at your convenience: Colestid 1 g: Take twice daily (Please ask the pharmacist to help you decide when to take this in relation to other medications)  Continue omeprazole twice a day.  Continue Carafate.  Thank you for entrusting me with your care and for choosing [facility], Dr. Wentworth Cellar

## 2019-11-20 NOTE — Progress Notes (Signed)
HPI :  79 year old female here for follow-up visit for history of GERD, dyspepsia, now with epigastric pain. I last saw her in March 2021.   At the time of our last visit she was having persistent loose stools with some urgency and fecal incontinence as well as some abdominal pain.  We proceeded with a colonoscopy in March, she had no inflammatory changes in her colon, biopsies were negative for microscopic colitis.  She had a few small benign-appearing polyps removed at that time.  She continues to have some loose stools although not as bad as it was before.  She has taken some Imodium but it does not really help her too much.  She does think Levsin has helped her abdomen when she takes it.  She inquires about other options she can use for this.  She has a history of cholecystectomy several years ago.  Main complaint today is otherwise epigastric pain.  She feels like this is there most of the time for the past 3 weeks, depends on what she eats but certainly has been new and a severe pain intermittently for the past 3 weeks.  She has had some episodes of vomiting and has vomited some blood which is new for her.  Symptoms are often postprandial, she denies any NSAID use.  I advised her to increase her omeprazole to 40 mg twice daily and she states this has helped a bit over the past few weeks.  She has also been taking Carafate which also provides some benefit.  She does have some reflux and some regurgitation that bothers her as well.  She has had a CT scan this past January which showed a moderate to large hiatal hernia, upwards of 50% of her stomach in her chest.  Last EGD in 2017 showed a 5 cm hiatal hernia. She has had a negative gastric emptying study in 2020.   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 Ileum  normal. No polyps or evidence of colitis. Biopsies not taken. EGD 08/05/2015 - 5cm HH, mild gastritis on path, mild esophagitis with esophagus with normal path, normal duodenum on path   CT scan 06/06/2019: IMPRESSION: 1. No acute findings in the abdomen or pelvis. 2. Moderate hiatal hernia with 25-50% of the stomach contained in the chest. 3. Left colonic diverticulosis without diverticulitis. 4. Aortic Atherosclerois (ICD10-170.0)  GES 12/07/18 - normal  Colonoscopy 08/14/19 - - The examined portion of the ileum was normal. - One diminutive polyp in the cecum, removed with a cold biopsy forceps. Resected and retrieved. - Two diminutive polyps in the transverse colon, removed with a cold biopsy forceps. Resected and retrieved. - One 5 mm polyp at the splenic flexure, removed with a cold snare. Resected and retrieved. - One 6 mm polyp in the descending colon, removed with a cold snare. Resected and retrieved. - Diverticulosis in the sigmoid colon. - Internal hemorrhoids. - The examination was otherwise normal. - Biopsies were taken with a cold forceps from the right colon, left colon and transverse colon for evaluation of microscopic colitis.  Path negative for MA   Past Medical History:  Diagnosis Date  . Anemia   . Anginal pain ([place])   . Anxiety   . Arthritis   . Asthma   . Blood transfusion without reported diagnosis   .  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 lumpectomy, radtiation x 30  . Breast cancer in female [facility])   . Change in mole 06/08/2016  . CHF (congestive heart failure) ([place])   . Colitis   . Complication of anesthesia    VERY SENSITIVE TO ANESTHESIA   . Decreased visual acuity 01/16/2017  . Dyspnea 11/04/2016   with asthma exacerbation only  . Dysuria 11/04/2016  . Gluten intolerance   . History of chicken pox   . History of Helicobacter pylori infection 08/01/2015  . Hyperlipidemia   . Hypertension   . Hypothyroidism     . IBS (irritable bowel syndrome)   . Left leg pain 03/01/2017  . Low back pain 08/10/2015  . 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 Neck pain 03/01/2017  . Osteoporosis   . Personal history of radiation therapy   . Pneumonia   . PONV (postoperative nausea and vomiting)   . RLQ discomfort 03/01/2017  . Stomach cramps   . Urinary tract infection 11/04/2016     Past Surgical History:  Procedure Laterality Date  . ABDOMINAL HYSTERECTOMY     partial  . APPENDECTOMY    . BREAST LUMPECTOMY Left 2002  . CHOLECYSTECTOMY  Age 29 or 68  . COLONOSCOPY  2017  . EYE SURGERY Left    cataract  . LEFT HEART CATH AND CORONARY ANGIOGRAPHY N/A 02/09/2019   Procedure: LEFT HEART CATH AND CORONARY ANGIOGRAPHY;  Surgeon: Nelva Bush, MD;  Location: [place] [facility];  Service: Cardiovascular;  Laterality: N/A;  . TONSILLECTOMY    . TOTAL HIP ARTHROPLASTY Left 02/13/2019   Procedure: TOTAL HIP ARTHROPLASTY ANTERIOR APPROACH;  Surgeon: Paralee Cancel, MD;  Location: [facility];  Service: Orthopedics;  Laterality: Left;  70 mins   Family History  Problem Relation Age of Onset  . Colitis Mother   . Irritable bowel syndrome Mother   . Hypertension Father   . Hyperlipidemia Brother   . Heart attack Brother   . Stomach cancer Paternal Grandmother 35  . Colon cancer Paternal Grandmother   . Hyperlipidemia Brother   . Heart attack Brother   . Hyperlipidemia Brother   . Heart attack Brother   . Hyperlipidemia Sister   . Leukemia Daughter   . Arthritis Daughter   . Heart disease Daughter        ASD vs VSD   Social History   Tobacco Use  . Smoking status: Never Smoker  . Smokeless tobacco: Never Used  Vaping Use  . Vaping Use: Never used  Substance Use Topics  . Alcohol use: Never    Alcohol/week: 0.0 standard drinks  . Drug use: Never   Current Outpatient Medications  Medication Sig Dispense Refill  . albuterol (VENTOLIN HFA) 108 (90 Base) MCG/ACT inhaler Inhale 2 puffs  into the lungs every 6 (six) hours as needed for wheezing or shortness of breath. 18 g 2  . ASPIRIN 81 PO 81 mg 2 (two) times daily.    Marland Kitchen atorvastatin (LIPITOR) 20 MG tablet Take 1 tablet (20 mg total) by mouth daily. 90 tablet 3  . cetirizine (ZYRTEC) 10 MG tablet Take 1 tablet (10 mg total) by mouth daily as needed for allergies. 30 tablet 5  . citalopram (CELEXA) 20 MG tablet TAKE 1 TABLET BY MOUTH AT BEDTIME 90 tablet 1  . dicyclomine (BENTYL) 10 MG capsule Take 1 capsule (10 mg total) by mouth every 8 (eight) hours as needed for spasms. 30 capsule 1  .  fluticasone (FLONASE) 50 MCG/ACT nasal spray Place 2 sprays into both nostrils daily as needed for allergies or rhinitis. 16 g 5  . fluticasone (FLOVENT HFA) 110 MCG/ACT inhaler Inhale 1 puff into the lungs 2 (two) times daily. 1 Inhaler 12  . furosemide (LASIX) 40 MG tablet Take 1 tablet by mouth once daily 90 tablet 0  . hyoscyamine (LEVSIN SL) 0.125 MG SL tablet DISSOLVE 1 TABLET UNDER THE TONGUE EVERY 6 HOURS AS NEEDED 30 tablet 0  . levothyroxine (SYNTHROID) 50 MCG tablet Take 1 tablet by mouth once daily 90 tablet 1  . lisinopril (ZESTRIL) 5 MG tablet Take 1 tablet by mouth twice daily 180 tablet 1  . metoprolol succinate (TOPROL-XL) 25 MG 24 hr tablet Take 1 tablet (25 mg total) by mouth at bedtime. 90 tablet 1  . omega-3 acid ethyl esters (LOVAZA) 1 g capsule Take 2 g by mouth daily.    Marland Kitchen omeprazole (PRILOSEC) 40 MG capsule Take 1 tablet by mouth once to twice daily 60 capsule 5  . ondansetron (ZOFRAN) 4 MG tablet Take 1 tablet (4 mg total) by mouth every 8 (eight) hours as needed for nausea or vomiting. 30 tablet 1  . potassium chloride (KLOR-CON) 10 MEQ tablet Take 1 tablet by mouth once daily 90 tablet 1  . pramipexole (MIRAPEX) 0.125 MG tablet Take 1 tablet (0.125 mg total) by mouth 2 (two) times daily. 60 tablet 1  . primidone (MYSOLINE) 50 MG tablet TAKE 1 TABLET BY MOUTH AT BEDTIME 90 tablet 3  . sucralfate (CARAFATE) 1 g tablet  Take 1 tablet (1 g total) by mouth 2 (two) times daily. 60 tablet 1  . sucralfate (CARAFATE) 1 g tablet Take 1 tablet (1 g total) by mouth 2 (two) times daily. 60 tablet 0  . ferrous sulfate (FERROUSUL) 325 (65 FE) MG tablet Take 1 tablet (325 mg total) by mouth 3 (three) times daily with meals for 14 days. 42 tablet 0   No current facility-administered medications for this visit.   No Known Allergies   Review of Systems: All systems reviewed and negative except where noted in HPI.   Lab Results  Component Value Date   WBC 5.2 11/16/2019   HGB 11.3 (L) 11/16/2019   HCT 33.8 (L) 11/16/2019   MCV 90.2 11/16/2019   PLT 185.0 11/16/2019    Lab Results  Component Value Date   CREATININE 1.22 (H) 11/16/2019   BUN 30 (H) 11/16/2019   NA 138 11/16/2019   K 4.1 11/16/2019   CL 101 11/16/2019   CO2 26 11/16/2019    Lab Results  Component Value Date   ALT 17 11/16/2019   AST 23 11/16/2019   ALKPHOS 65 11/16/2019   BILITOT 0.5 11/16/2019    Lab Results  Component Value Date   LIPASE 34.0 11/16/2019     Physical Exam: BP (!) 150/72   Pulse 64   Ht 4\' 10"  (1.473 m)   Wt 123 lb (55.8 kg)   BMI 25.71 kg/m  Constitutional: Pleasant,well-developed, female in no acute distress. HEENT: Normocephalic and atraumatic. Conjunctivae are normal. No scleral icterus. Neck supple.  Cardiovascular: Normal rate, regular rhythm.  Pulmonary/chest: Effort normal and breath sounds normal. No wheezing, rales or rhonchi. Abdominal: Soft, nondistended, nontender. There are no masses palpable. Extremities: no edema Lymphadenopathy: No cervical adenopathy noted. Neurological: Alert and oriented to person place and time. Skin: Skin is warm and dry. No rashes noted. Psychiatric: Normal mood and affect. Behavior is normal.  ASSESSMENT AND PLAN: 79 year old female here for reassessment following:  Epigastric pain / hematemesis / GERD / hiatal hernia - 3 weeks worth of postprandial discomfort  with an episode of hematemesis.  Hemoglobin stable a few days ago.  She is also having persistent reflux symptoms despite higher dose omeprazole although is improved since taking omeprazole 40 mg twice daily.  She is also taking Carafate.  She is status post cholecystectomy, labs otherwise reassuring.  I am concerned her symptoms could be due to large hiatal hernia, upwards of 50% of the stomach is in her chest based on most recent CT, at risk for [facility] lesions.  Given her ongoing symptoms I am recommending an EGD to further evaluate this.  If nothing other than hiatal hernia is noted, and this is large, we may need to consider surgical evaluation for her hernia.  We discussed that we would want to avoid surgery if at all possible for her however if this is causing her symptoms that are persistent despite medical therapy it is something to consider.  She agreed and will await EGD to be done in the next week or so.  Continue high-dose omeprazole and Carafate as needed as this has provided benefit recently.  She can otherwise continue Bentyl as needed as this can often provide some benefit to her symptoms as well.  Loose stools - she does not have any evidence of microscopic colitis.  Negative testing for celiac disease previously.  She is status post cholecystectomy.  Given her urgency and postprandial loose stools we will give her a trial of Colestid 1 g twice daily empirically and see if this helps, I asked her to touch base with me in a week or 2 will me know how she is doing.  She agreed  Beckham Cellar, MD [facility]

## 2019-11-21 ENCOUNTER — Encounter: Payer: Self-pay | Admitting: Gastroenterology

## 2019-11-21 ENCOUNTER — Other Ambulatory Visit: Payer: Self-pay | Admitting: Gastroenterology

## 2019-11-23 ENCOUNTER — Encounter: Payer: Self-pay | Admitting: Cardiology

## 2019-11-23 ENCOUNTER — Other Ambulatory Visit: Payer: Medicare HMO

## 2019-11-23 ENCOUNTER — Ambulatory Visit: Payer: Medicare HMO | Admitting: Cardiology

## 2019-11-23 ENCOUNTER — Other Ambulatory Visit: Payer: Self-pay

## 2019-11-23 VITALS — BP 114/58 | HR 64 | Ht <= 58 in | Wt 123.6 lb

## 2019-11-23 DIAGNOSIS — I10 Essential (primary) hypertension: Secondary | ICD-10-CM

## 2019-11-23 DIAGNOSIS — I5032 Chronic diastolic (congestive) heart failure: Secondary | ICD-10-CM | POA: Diagnosis not present

## 2019-11-23 DIAGNOSIS — Z01810 Encounter for preprocedural cardiovascular examination: Secondary | ICD-10-CM

## 2019-11-23 DIAGNOSIS — I471 Supraventricular tachycardia: Secondary | ICD-10-CM | POA: Diagnosis not present

## 2019-11-23 DIAGNOSIS — E782 Mixed hyperlipidemia: Secondary | ICD-10-CM | POA: Diagnosis not present

## 2019-11-23 NOTE — Patient Instructions (Signed)
Medication Instructions:  Your physician recommends that you continue on your current medications as directed. Please refer to the Current Medication list given to you today.  *If you need a refill on your cardiac medications before your next appointment, please call your pharmacy*   Lab Work: [place]   If you have labs (blood work) drawn today and your tests are completely normal, you will receive your results only by: Marland Kitchen MyChart Message (if you have MyChart) OR . A paper copy in the mail If you have any lab test that is abnormal or we need to change your treatment, we will call you to review the results.   Testing/Procedures: NONE ORDERED  TODAY   Follow-Up: At [facility], you and your health needs are our priority.  As part of our continuing mission to provide you with exceptional heart care, we have created designated Provider Care Teams.  These Care Teams include your primary Cardiologist (physician) and Advanced Practice Providers (APPs -  Physician Assistants and Nurse Practitioners) who all work together to provide you with the care you need, when you need it.  We recommend signing up for the patient portal called "MyChart".  Sign up information is provided on this After Visit Summary.  MyChart is used to connect with patients for Virtual Visits (Telemedicine).  Patients are able to view lab/test results, encounter notes, upcoming appointments, etc.  Non-urgent messages can be sent to your provider as well.   To learn more about what you can do with MyChart, go to [email or web address].    Your next appointment:   3 month(s)  The format for your next appointment:   In Person  Provider:   You will see Berniece Salines, DO.  Or, you can be scheduled with the following Advanced Practice Provider on your designated Care Team (at our [facility]):  Laurann Montana, FNP     Other Instructions

## 2019-11-23 NOTE — Progress Notes (Signed)
Cardiology Office Note:    Date:  11/23/2019   ID:  Julie Wilkerson, DOB 1941/05/15, MRN [id number]  PCP:  [name], MD  Cardiologist:  Berniece Salines, DO  Electrophysiologist:  None   Referring MD: [name], MD   " I am planning on getting an endoscopy"  History of Present Illness:    Julie Wilkerson is a 79 y.o. female with a hx of hypertension, coronary disease, hyperlipidemia, chronic diastolic heart.  Patient tells me that she is planning on endoscopy and she was asked gastroenterologist ask her to see a cardiologist.  She denies any chest pain, tells me that she gets short of breath at times but it is because she has asthma and her inhalers helps her.  Past Medical History:  Diagnosis Date  . Anemia   . Anginal pain ([place])   . Anxiety   . Arthritis   . Asthma   . Blood transfusion without reported diagnosis   . Breast cancer ([place])    2002, left, encapsulated, microcalcifications. lumpectomy, radtiation x 30  . Breast cancer in female [facility])   . Change in mole 06/08/2016  . CHF (congestive heart failure) ([place])   . Colitis   . Complication of anesthesia    VERY SENSITIVE TO ANESTHESIA   . Decreased visual acuity 01/16/2017  . Dyspnea 11/04/2016   with asthma exacerbation only  . Dysuria 11/04/2016  . Gluten intolerance   . History of chicken pox   . History of Helicobacter pylori infection 08/01/2015  . Hyperlipidemia   . Hypertension   . Hypothyroidism   . IBS (irritable bowel syndrome)   . Left leg pain 03/01/2017  . Low back pain 08/10/2015  . 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 Neck pain 03/01/2017  . Osteoporosis   . Personal history of radiation therapy   . Pneumonia   . PONV (postoperative nausea and vomiting)   . RLQ discomfort 03/01/2017  . Stomach cramps   . Urinary tract infection 11/04/2016    Past Surgical History:  Procedure Laterality Date  . ABDOMINAL HYSTERECTOMY     partial  . APPENDECTOMY    . BREAST  LUMPECTOMY Left 2002  . CHOLECYSTECTOMY  Age 55 or 35  . COLONOSCOPY  2017  . EYE SURGERY Left    cataract  . LEFT HEART CATH AND CORONARY ANGIOGRAPHY N/A 02/09/2019   Procedure: LEFT HEART CATH AND CORONARY ANGIOGRAPHY;  Surgeon: Nelva Bush, MD;  Location: [facility];  Service: Cardiovascular;  Laterality: N/A;  . TONSILLECTOMY    . TOTAL HIP ARTHROPLASTY Left 02/13/2019   Procedure: TOTAL HIP ARTHROPLASTY ANTERIOR APPROACH;  Surgeon: Paralee Cancel, MD;  Location: [facility];  Service: Orthopedics;  Laterality: Left;  70 mins    Current Medications: Current Meds  Medication Sig  . albuterol (VENTOLIN HFA) 108 (90 Base) MCG/ACT inhaler Inhale 2 puffs into the lungs every 6 (six) hours as needed for wheezing or shortness of breath.  . ASPIRIN 81 PO 81 mg 2 (two) times daily.  Marland Kitchen atorvastatin (LIPITOR) 20 MG tablet Take 1 tablet (20 mg total) by mouth daily.  . cetirizine (ZYRTEC) 10 MG tablet Take 1 tablet (10 mg total) by mouth daily as needed for allergies.  . citalopram (CELEXA) 20 MG tablet TAKE 1 TABLET BY MOUTH AT BEDTIME  . colestipol (COLESTID) 1 g tablet Take 1 tablet (1 g total) by mouth 2 (two) times daily.  Marland Kitchen dicyclomine (BENTYL) 10 MG capsule TAKE  1 CAPSULE BY MOUTH EVERY 8 HOURS AS NEEDED FOR SPASMS  . ferrous sulfate (FERROUSUL) 325 (65 FE) MG tablet Take 1 tablet (325 mg total) by mouth 3 (three) times daily with meals for 14 days.  . fluticasone (FLONASE) 50 MCG/ACT nasal spray Place 2 sprays into both nostrils daily as needed for allergies or rhinitis.  . fluticasone (FLOVENT HFA) 110 MCG/ACT inhaler Inhale 1 puff into the lungs 2 (two) times daily.  . furosemide (LASIX) 40 MG tablet Take 1 tablet by mouth once daily  . hyoscyamine (LEVSIN SL) 0.125 MG SL tablet DISSOLVE 1 TABLET UNDER THE TONGUE EVERY 6 HOURS AS NEEDED  . levothyroxine (SYNTHROID) 50 MCG tablet Take 1 tablet by mouth once daily  . lisinopril (ZESTRIL) 5 MG tablet Take 1 tablet by mouth twice daily   . metoprolol succinate (TOPROL-XL) 25 MG 24 hr tablet Take 1 tablet (25 mg total) by mouth at bedtime.  Marland Kitchen omega-3 acid ethyl esters (LOVAZA) 1 g capsule Take 2 g by mouth daily.  Marland Kitchen omeprazole (PRILOSEC) 40 MG capsule Take 1 capsule (40 mg total) by mouth in the morning and at bedtime. Take 1 tablet by mouth once to twice daily  . ondansetron (ZOFRAN) 4 MG tablet Take 1 tablet (4 mg total) by mouth every 8 (eight) hours as needed for nausea or vomiting.  . potassium chloride (KLOR-CON) 10 MEQ tablet Take 1 tablet by mouth once daily  . pramipexole (MIRAPEX) 0.125 MG tablet Take 1 tablet (0.125 mg total) by mouth 2 (two) times daily.  . primidone (MYSOLINE) 50 MG tablet TAKE 1 TABLET BY MOUTH AT BEDTIME  . sucralfate (CARAFATE) 1 g tablet Take 1 tablet (1 g total) by mouth 2 (two) times daily.  . sucralfate (CARAFATE) 1 g tablet Take 1 tablet (1 g total) by mouth 2 (two) times daily.     Allergies:   Patient has no known allergies.   Social History   Socioeconomic History  . Marital status: Single    Spouse name: Not on file  . Number of children: 3  . Years of education: 64  . Highest education level: Master's degree (e.g., MA, MS, MEng, MEd, MSW, MBA)  Occupational History  . Occupation: retired    Comment: Pharmacist, hospital, kindergarten  Tobacco Use  . Smoking status: Never Smoker  . Smokeless tobacco: Never Used  Vaping Use  . Vaping Use: Never used  Substance and Sexual Activity  . Alcohol use: Never    Alcohol/week: 0.0 standard drinks  . Drug use: Never  . Sexual activity: Not Currently    Birth control/protection: None    Comment: lives alone, avoids daiiry and gluten. 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 Not on file  Social History Narrative  . Not on file   Social Determinants of Health   Financial Resource Strain:   . Difficulty of Paying Living Expenses:   Food Insecurity:   . Worried About Charity fundraiser in the Last Year:   . Arboriculturist in the  Last Year:   Transportation Needs:   . Film/video editor (Medical):   Marland Kitchen Lack of Transportation (Non-Medical):   Physical Activity:   . Days of Exercise per Week:   . Minutes of Exercise per Session:   Stress:   . Feeling of Stress :   Social Connections:   . Frequency of Communication with Friends and Family:   . Frequency of Social Gatherings with Friends and Family:   .  Attends Religious Services:   . Active Member of Clubs or Organizations:   . Attends Archivist Meetings:   Marland Kitchen Marital Status:      Family History: The patient's family history includes Arthritis in her daughter; Colitis in her mother; Colon cancer in her paternal grandmother; Heart attack in her brother, brother, and brother; Heart disease in her daughter; Hyperlipidemia in her brother, brother, brother, and sister; Hypertension in her father; Irritable bowel syndrome in her mother; Leukemia in her daughter; Stomach cancer (age of onset: 69) in her paternal grandmother.  ROS:   Review of Systems  Constitution: Negative for decreased appetite, fever and weight gain.  HENT: Negative for congestion, ear discharge, hoarse voice and sore throat.   Eyes: Negative for discharge, redness, vision loss in right eye and visual halos.  Cardiovascular: Negative for chest pain, dyspnea on exertion, leg swelling, orthopnea and palpitations.  Respiratory: Negative for cough, hemoptysis, shortness of breath and snoring.   Endocrine: Negative for heat intolerance and polyphagia.  Hematologic/Lymphatic: Negative for bleeding problem. Does not bruise/bleed easily.  Skin: Negative for flushing, nail changes, rash and suspicious lesions.  Musculoskeletal: Negative for arthritis, joint pain, muscle cramps, myalgias, neck pain and stiffness.  Gastrointestinal: Negative for abdominal pain, bowel incontinence, diarrhea and excessive appetite.  Genitourinary: Negative for decreased libido, genital sores and incomplete emptying.   Neurological: Negative for brief paralysis, focal weakness, headaches and loss of balance.  Psychiatric/Behavioral: Negative for altered mental status, depression and suicidal ideas.  Allergic/Immunologic: Negative for HIV exposure and persistent infections.    EKGs/Labs/Other Studies Reviewed:    The following studies were reviewed today:   EKG: None today  Echocardiogram done in September 2020 IMPRESSIONS  FINDINGS  Left Ventricle: Left ventricular ejection fraction, by visual estimation,  is 60 to 65%. The left ventricle has normal function. There is no left  ventricular hypertrophy.   Right Ventricle: The right ventricular size is normal. No increase in  right ventricular wall thickness. Global RV systolic function is has  normal systolic function.   Left Atrium: Left atrial size was normal in size.   Right Atrium: Right atrial size was normal in size   Pericardium: There is no evidence of pericardial effusion.   Mitral Valve: The mitral valve is normal in structure. Trace mitral valve  regurgitation.   Tricuspid Valve: The tricuspid valve is normal in structure. Tricuspid  valve regurgitation was not assessed by color flow Doppler.   Aortic Valve: The aortic valve is normal in structure. Aortic valve  regurgitation was not visualized by color flow Doppler.   Pulmonic Valve: The pulmonic valve was not well visualized. Pulmonic valve  regurgitation was not assessed by color flow Doppler.   Aorta: The aortic root is normal in size and structure.   Shunts: The interatrial septum was not assessed.    Recent Labs: 02/22/2019: B Natriuretic Peptide 310.7 02/26/2019: Pro B Natriuretic peptide (BNP) 62.0 04/03/2019: Magnesium 1.8 09/18/2019: TSH 1.84 11/16/2019: ALT 17; BUN 30; Creatinine, Ser 1.22; Hemoglobin 11.3; Platelets 185.0; Potassium 4.1; Sodium 138  Recent Lipid Panel    Component Value Date/Time   CHOL 151 09/18/2019 1155   TRIG 92.0 09/18/2019 1155   HDL  58.00 09/18/2019 1155   CHOLHDL 3 09/18/2019 1155   VLDL 18.4 09/18/2019 1155   LDLCALC 74 09/18/2019 1155    Physical Exam:    VS:  BP (!) 114/58   Pulse 64   Ht 4\' 10"  (1.473 m)   Wt 123  lb 9.6 oz (56.1 kg)   SpO2 94%   BMI 25.83 kg/m     Wt Readings from Last 3 Encounters:  11/23/19 123 lb 9.6 oz (56.1 kg)  11/20/19 123 lb (55.8 kg)  11/16/19 122 lb (55.3 kg)     GEN: Well nourished, well developed in no acute distress HEENT: Normal NECK: No JVD; No carotid bruits LYMPHATICS: No lymphadenopathy CARDIAC: S1S2 noted,RRR, no murmurs, rubs, gallops RESPIRATORY:  Clear to auscultation without rales, wheezing or rhonchi  ABDOMEN: Soft, non-tender, non-distended, +bowel sounds, no guarding. EXTREMITIES: No edema, No cyanosis, no clubbing MUSCULOSKELETAL:  No deformity  SKIN: Warm and dry NEUROLOGIC:  Alert and oriented x 3, non-focal PSYCHIATRIC:  Normal affect, good insight  ASSESSMENT:    1. Essential hypertension   2. Preprocedural cardiovascular examination   3. Paroxysmal atrial tachycardia ([place])   4. Chronic diastolic congestive heart failure ([place])   5. Mixed hyperlipidemia    PLAN:     The patient does not have any unstable cardiac conditions.  Upon evaluation today, shes can achieve 4 METs or greater without anginal symptoms.  According to [facility] and AHA guidelines, she requires no further cardiac workup prior to her noncardiac surgery and should be at acceptable risk.  Our service is available as necessary in the perioperative period.  Hypertension-her blood pressure deceptively in the office today no changes will be made to her current regimen.  Coronary artery disease-no anginal symptoms continue her aspirin as well as her atorvastatin.  Chronic diastolic heart failure-the patient appears to be compensated at this time..   The patient is in agreement with the above plan. The patient left the office in stable condition.  The patient will follow up in 3 months  or sooner if needed.   Medication Adjustments/Labs and Tests Ordered: Current medicines are reviewed at length with the patient today.  Concerns regarding medicines are outlined above.  No orders of the defined types were placed in this encounter.  No orders of the defined types were placed in this encounter.   Patient Instructions  Medication Instructions:  Your physician recommends that you continue on your current medications as directed. Please refer to the Current Medication list given to you today.  *If you need a refill on your cardiac medications before your next appointment, please call your pharmacy*   Lab Work: [place]   If you have labs (blood work) drawn today and your tests are completely normal, you will receive your results only by: Marland Kitchen MyChart Message (if you have MyChart) OR . A paper copy in the mail If you have any lab test that is abnormal or we need to change your treatment, we will call you to review the results.   Testing/Procedures: NONE ORDERED  TODAY   Follow-Up: At [facility], you and your health needs are our priority.  As part of our continuing mission to provide you with exceptional heart care, we have created designated Provider Care Teams.  These Care Teams include your primary Cardiologist (physician) and Advanced Practice Providers (APPs -  Physician Assistants and Nurse Practitioners) who all work together to provide you with the care you need, when you need it.  We recommend signing up for the patient portal called "MyChart".  Sign up information is provided on this After Visit Summary.  MyChart is used to connect with patients for Virtual Visits (Telemedicine).  Patients are able to view lab/test results, encounter notes, upcoming appointments, etc.  Non-urgent messages can  be sent to your provider as well.   To learn more about what you can do with MyChart, go to [email or web address].    Your next appointment:   3  month(s)  The format for your next appointment:   In Person  Provider:   You will see Berniece Salines, DO.  Or, you can be scheduled with the following Advanced Practice Provider on your designated Care Team (at our [facility]):  Laurann Montana, FNP     Other Instructions      Adopting a Healthy Lifestyle.  Know what a healthy weight is for you (roughly BMI <25) and aim to maintain this   Aim for 7+ servings of fruits and vegetables daily   65-80+ fluid ounces of water or unsweet tea for healthy kidneys   Limit to max 1 drink of alcohol per day; avoid smoking/tobacco   Limit animal fats in diet for cholesterol and heart health - choose grass fed whenever available   Avoid highly processed foods, and foods high in saturated/trans fats   Aim for low stress - take time to unwind and care for your mental health   Aim for 150 min of moderate intensity exercise weekly for heart health, and weights twice weekly for bone health   Aim for 7-9 hours of sleep daily   When it comes to diets, agreement about the perfect plan isnt easy to find, even among the experts. Experts at the [employer] developed an idea known as the Healthy Eating Plate. Just imagine a plate divided into logical, healthy portions.   The emphasis is on diet quality:   Load up on vegetables and fruits - one-half of your plate: Aim for color and variety, and remember that potatoes dont count.   Go for whole grains - one-quarter of your plate: Whole wheat, barley, wheat berries, quinoa, oats, brown rice, and foods made with them. If you want pasta, go with whole wheat pasta.   Protein power - one-quarter of your plate: Fish, chicken, beans, and nuts are all healthy, versatile protein sources. Limit red meat.   The diet, however, does go beyond the plate, offering a few other suggestions.   Use healthy plant oils, such as olive, canola, soy, corn, sunflower and peanut. Check the  labels, and avoid partially hydrogenated oil, which have unhealthy trans fats.   If youre thirsty, drink water. Coffee and tea are good in moderation, but skip sugary drinks and limit milk and dairy products to one or two daily servings.   The type of carbohydrate in the diet is more important than the amount. Some sources of carbohydrates, such as vegetables, fruits, whole grains, and beans-are healthier than others.   Finally, stay active  Signed, Berniece Salines, DO  11/23/2019 4:56 PM    [facility]

## 2019-11-28 ENCOUNTER — Ambulatory Visit (AMBULATORY_SURGERY_CENTER): Payer: Medicare HMO | Admitting: Gastroenterology

## 2019-11-28 ENCOUNTER — Encounter: Payer: Self-pay | Admitting: Gastroenterology

## 2019-11-28 ENCOUNTER — Other Ambulatory Visit: Payer: Self-pay

## 2019-11-28 VITALS — BP 126/59 | HR 55 | Temp 97.3°F | Resp 19 | Ht <= 58 in | Wt 123.0 lb

## 2019-11-28 DIAGNOSIS — K920 Hematemesis: Secondary | ICD-10-CM | POA: Diagnosis not present

## 2019-11-28 DIAGNOSIS — R1013 Epigastric pain: Secondary | ICD-10-CM

## 2019-11-28 DIAGNOSIS — K3189 Other diseases of stomach and duodenum: Secondary | ICD-10-CM | POA: Diagnosis not present

## 2019-11-28 DIAGNOSIS — J45909 Unspecified asthma, uncomplicated: Secondary | ICD-10-CM | POA: Diagnosis not present

## 2019-11-28 DIAGNOSIS — K449 Diaphragmatic hernia without obstruction or gangrene: Secondary | ICD-10-CM | POA: Diagnosis not present

## 2019-11-28 DIAGNOSIS — K219 Gastro-esophageal reflux disease without esophagitis: Secondary | ICD-10-CM | POA: Diagnosis not present

## 2019-11-28 DIAGNOSIS — I10 Essential (primary) hypertension: Secondary | ICD-10-CM | POA: Diagnosis not present

## 2019-11-28 MED ORDER — SODIUM CHLORIDE 0.9 % IV SOLN: 500.0000 mL | Freq: Once | INTRAVENOUS | Status: DC

## 2019-11-28 NOTE — Op Note (Signed)
[place] Patient Name: Julie Wilkerson Procedure Date: 11/28/2019 7:12 AM MRN: [id number] Endoscopist: Remo Lipps P. Havery Moros , MD Age: 79 Referring MD:  Date of Birth: [birth year] Gender: Female Account #: [id number] Procedure:                Upper GI endoscopy Indications:              Epigastric abdominal pain, Follow-up of                            gastro-esophageal reflux disease, Hematemesis - now                            on omeprazole 40mg  twice daily and carafate PRN,                            patient endorses doing better. Also with loose                            stools, prior colonoscopy negative, recently                            started trial of colestid Medicines:                Monitored Anesthesia Care Procedure:                Pre-Anesthesia Assessment:                           - Prior to the procedure, a History and Physical                            was performed, and patient medications and                            allergies were reviewed. The patient's tolerance of                            previous anesthesia was also reviewed. The risks                            and benefits of the procedure and the sedation                            options and risks were discussed with the patient.                            All questions were answered, and informed consent                            was obtained. Prior Anticoagulants: The patient has                            taken no previous anticoagulant or antiplatelet  agents. ASA Grade Assessment: II - A patient with                            mild systemic disease. After reviewing the risks                            and benefits, the patient was deemed in                            satisfactory condition to undergo the procedure.                           After obtaining informed consent, the endoscope was                            passed under direct vision. Throughout  the                            procedure, the patient's blood pressure, pulse, and                            oxygen saturations were monitored continuously. The                            Endoscope was introduced through the mouth, and                            advanced to the second part of duodenum. The upper                            GI endoscopy was accomplished without difficulty.                            The patient tolerated the procedure well. Scope In: Scope Out: Findings:                 Esophagogastric landmarks were identified: the                            Z-line was found at 31 cm, the gastroesophageal                            junction was found at 31 cm and the upper extent of                            the gastric folds was found at 36 cm from the                            incisors.                           A 5 cm hiatal hernia was present. No obvious  cameron lesions noted.                           The exam of the esophagus was otherwise normal.                           Patchy mildly erythematous mucosa was found in the                            gastric antrum.                           The exam of the stomach was otherwise normal. No                            ulcers                           Biopsies were taken with a cold forceps in the                            gastric body, at the incisura and in the gastric                            antrum for Helicobacter pylori testing.                           The duodenal bulb and second portion of the                            duodenum were normal. Biopsies for histology were                            taken with a cold forceps for evaluation of celiac                            disease. Complications:            No immediate complications. Estimated blood loss:                            Minimal. Estimated Blood Loss:     Estimated blood loss was minimal. Impression:                - Esophagogastric landmarks identified.                           - 5 cm hiatal hernia.                           - Normal esophagus otherwise                           - Erythematous mucosa in the antrum.                           - Normal stomach otherwise -  biopsies taken to rule                            out H pylori                           - Normal duodenal bulb and second portion of the                            duodenum. Biopsied.                           It's possible hiatal hernia could be related to                            upper tract symptoms, no obvious Lysbeth Galas lesions                            noted now but could have had this prior to                            escalation of therapy. Patient doing better now on                            present regimen Recommendation:           - Patient has a contact number available for                            emergencies. The signs and symptoms of potential                            delayed complications were discussed with the                            patient. Return to normal activities tomorrow.                            Written discharge instructions were provided to the                            patient.                           - Resume previous diet.                           - Continue present medications.                           - Await pathology results and course on present                            regimen. Remo Lipps P. Lajoyce Tamura, MD 11/28/2019 7:48:56 AM This report has been signed electronically.

## 2019-11-28 NOTE — Progress Notes (Signed)
Called to room to assist during endoscopic procedure.  Patient ID and intended procedure confirmed with present staff. Received instructions for my participation in the procedure from the performing physician.  

## 2019-11-28 NOTE — Progress Notes (Signed)
Report to PACU, RN, vss, BBS= Clear.  

## 2019-11-28 NOTE — Patient Instructions (Signed)
Handout on hiatal hernia given    YOU HAD AN ENDOSCOPIC PROCEDURE TODAY AT [facility]:   Refer to the procedure report that was given to you for any specific questions about what was found during the examination.  If the procedure report does not answer your questions, please call your gastroenterologist to clarify.  If you requested that your care partner not be given the details of your procedure findings, then the procedure report has been included in a sealed envelope for you to review at your convenience later.  YOU SHOULD EXPECT: Some feelings of bloating in the abdomen. Passage of more gas than usual.  Walking can help get rid of the air that was put into your GI tract during the procedure and reduce the bloating. If you had a lower endoscopy (such as a colonoscopy or flexible sigmoidoscopy) you may notice spotting of blood in your stool or on the toilet paper. If you underwent a bowel prep for your procedure, you may not have a normal bowel movement for a few days.  Please Note:  You might notice some irritation and congestion in your nose or some drainage.  This is from the oxygen used during your procedure.  There is no need for concern and it should clear up in a day or so.  SYMPTOMS TO REPORT IMMEDIATELY:    Following upper endoscopy (EGD)  Vomiting of blood or coffee ground material  New chest pain or pain under the shoulder blades  Painful or persistently difficult swallowing  New shortness of breath  Fever of 100F or higher  Black, tarry-looking stools  For urgent or emergent issues, a gastroenterologist can be reached at any hour by calling [phone]. Do not use MyChart messaging for urgent concerns.    DIET:  We do recommend a small meal at first, but then you may proceed to your regular diet.  Drink plenty of fluids but you should avoid alcoholic beverages for 24 hours.  ACTIVITY:  You should plan to take it easy for the rest of today and you  should NOT DRIVE or use heavy machinery until tomorrow (because of the sedation medicines used during the test).    FOLLOW UP: Our staff will call the number listed on your records 48-72 hours following your procedure to check on you and address any questions or concerns that you may have regarding the information given to you following your procedure. If we do not reach you, we will leave a message.  We will attempt to reach you two times.  During this call, we will ask if you have developed any symptoms of COVID 19. If you develop any symptoms (ie: fever, flu-like symptoms, shortness of breath, cough etc.) before then, please call [phone].  If you test positive for Covid 19 in the 2 weeks post procedure, please call and report this information to [name].    If any biopsies were taken you will be contacted by phone or by letter within the next 1-3 weeks.  Please call us at [phone] if you have not heard about the biopsies in 3 weeks.    SIGNATURES/CONFIDENTIALITY: You and/or your care partner have signed paperwork which will be entered into your electronic medical record.  These signatures attest to the fact that that the information above on your After Visit Summary has been reviewed and is understood.  Full responsibility of the confidentiality of this discharge information lies with you and/or your care-partner.

## 2019-11-29 ENCOUNTER — Ambulatory Visit (INDEPENDENT_AMBULATORY_CARE_PROVIDER_SITE_OTHER): Payer: Medicare HMO | Admitting: Family Medicine

## 2019-11-29 ENCOUNTER — Ambulatory Visit (HOSPITAL_BASED_OUTPATIENT_CLINIC_OR_DEPARTMENT_OTHER)
Admission: RE | Admit: 2019-11-29 | Discharge: 2019-11-29 | Disposition: A | Discharge status: Home / Self Care | Payer: Medicare HMO | Source: Ambulatory Visit | Attending: Family Medicine | Admitting: Family Medicine

## 2019-11-29 VITALS — BP 108/60 | HR 54 | Temp 98.0°F | Resp 12 | Ht <= 58 in | Wt 123.2 lb

## 2019-11-29 DIAGNOSIS — M47816 Spondylosis without myelopathy or radiculopathy, lumbar region: Secondary | ICD-10-CM | POA: Diagnosis not present

## 2019-11-29 DIAGNOSIS — M25551 Pain in right hip: Secondary | ICD-10-CM | POA: Insufficient documentation

## 2019-11-29 DIAGNOSIS — I10 Essential (primary) hypertension: Secondary | ICD-10-CM | POA: Diagnosis not present

## 2019-11-29 DIAGNOSIS — E782 Mixed hyperlipidemia: Secondary | ICD-10-CM

## 2019-11-29 DIAGNOSIS — M1611 Unilateral primary osteoarthritis, right hip: Secondary | ICD-10-CM | POA: Diagnosis not present

## 2019-11-29 DIAGNOSIS — R739 Hyperglycemia, unspecified: Secondary | ICD-10-CM

## 2019-11-29 DIAGNOSIS — R109 Unspecified abdominal pain: Secondary | ICD-10-CM | POA: Diagnosis not present

## 2019-11-29 DIAGNOSIS — M25552 Pain in left hip: Secondary | ICD-10-CM | POA: Diagnosis not present

## 2019-11-29 NOTE — Patient Instructions (Signed)

## 2019-11-29 NOTE — Progress Notes (Signed)
0

## 2019-11-30 ENCOUNTER — Telehealth: Payer: Self-pay

## 2019-11-30 NOTE — Telephone Encounter (Signed)
  Follow up Call-  Call back number 11/28/2019 08/14/2019 10/24/2018  Post procedure Call Back phone  # [phone]  Permission to leave phone message Yes Yes -  Some recent data might be hidden     Patient questions:  Do you have a fever, pain , or abdominal swelling? No. Pain Score  0 *  Have you tolerated food without any problems? Yes.    Have you been able to return to your normal activities? Yes.    Do you have any questions about your discharge instructions: Diet   No. Medications  No. Follow up visit  No.  Do you have questions or concerns about your Care? No.  Actions: * If pain score is 4 or above: No action needed, pain <4.  1. Have you developed a fever since your procedure? no  2.   Have you had an respiratory symptoms (SOB or cough) since your procedure? no  3.   Have you tested positive for COVID 19 since your procedure no  4.   Have you had any family members/close contacts diagnosed with the COVID 19 since your procedure?  no   If yes to any of these questions please route to Joylene John, RN and Erenest Rasher, RN

## 2019-12-03 NOTE — Assessment & Plan Note (Signed)
Encouraged moist heat and gentle stretching as tolerated. May try NSAIDs and prescription meds as directed and report if symptoms worsen or seek immediate care. Xray ordered

## 2019-12-03 NOTE — Assessment & Plan Note (Signed)
hgba1c acceptable, minimize simple carbs. Increase exercise as tolerated.  

## 2019-12-03 NOTE — Assessment & Plan Note (Signed)
Improving and responds to Hyoscyamine prn. Decreased diarrhea and no fevers or nausea or vomiting.

## 2019-12-03 NOTE — Progress Notes (Signed)
Patient ID: Julie Wilkerson, female   DOB: [birth year], 79 y.o.   MRN: [id number]   Subjective:    Patient ID: Julie Wilkerson, female    DOB: [birth year], 79 y.o.   MRN: [id number]  Chief Complaint  Patient presents with  . 3 week follow up    HPI Patient is in today for follow up on chronic medical concerns. No recent febrile illness or hospitalizations. Her abdominal pain has been improving but still occurs. Does respond to Hyoscyamine when needed. Still having some loose stool but it has improved. No bloody or tarry stool. She is having increased left sided back pain and has been following orthopaedics, she has received an injection from Dr Nelva Bush which helped some. They may consider surgery in the future. Her left hip bothers her at times. No radicular symptoms or incontinence. Denies CP/palp/SOB/HA/congestion/fevers. Taking meds as prescribed. Does still have 2-4 loose stool daily.   Past Medical History:  Diagnosis Date  . Anemia   . Anginal pain ([place])   . Anxiety   . Arthritis   . Asthma   . Blood transfusion without reported diagnosis   . Breast cancer ([place])    2002, left, encapsulated, microcalcifications. lumpectomy, radtiation x 30  . Breast cancer in female [facility])   . Change in mole 06/08/2016  . CHF (congestive heart failure) ([place])   . Colitis   . Complication of anesthesia    VERY SENSITIVE TO ANESTHESIA   . Decreased visual acuity 01/16/2017  . Dyspnea 11/04/2016   with asthma exacerbation only  . Dysuria 11/04/2016  . Gluten intolerance   . History of chicken pox   . History of Helicobacter pylori infection 08/01/2015  . Hyperlipidemia   . Hypertension   . Hypothyroidism   . IBS (irritable bowel syndrome)   . Left leg pain 03/01/2017  . Low back pain 08/10/2015  . 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 Neck pain 03/01/2017  . Osteoporosis   . Personal history of radiation therapy   . Pneumonia   . PONV (postoperative nausea and vomiting)   .  RLQ discomfort 03/01/2017  . Stomach cramps   . Urinary tract infection 11/04/2016    Past Surgical History:  Procedure Laterality Date  . ABDOMINAL HYSTERECTOMY     partial  . APPENDECTOMY    . BREAST LUMPECTOMY Left 2002  . CHOLECYSTECTOMY  Age 42 or 19  . COLONOSCOPY  2017  . EYE SURGERY Left    cataract  . LEFT HEART CATH AND CORONARY ANGIOGRAPHY N/A 02/09/2019   Procedure: LEFT HEART CATH AND CORONARY ANGIOGRAPHY;  Surgeon: Nelva Bush, MD;  Location: [facility];  Service: Cardiovascular;  Laterality: N/A;  . TONSILLECTOMY    . 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 Colitis Mother   . Irritable bowel syndrome Mother   . Hypertension Father   . Hyperlipidemia Brother   . Heart attack Brother   . Stomach cancer Paternal Grandmother 59  . Colon cancer Paternal Grandmother   . Hyperlipidemia Brother   . Heart attack Brother   . Hyperlipidemia Brother   . Heart attack Brother   . Hyperlipidemia Sister   . Leukemia Daughter   . Arthritis Daughter   . Heart disease Daughter  ASD vs VSD  . Esophageal cancer Neg Hx     Social History   Socioeconomic History  . Marital status: Single    Spouse name: Not on file  . Number of children: 3  . Years of education: 73  . Highest education level: Master's degree (e.g., MA, MS, MEng, MEd, MSW, MBA)  Occupational History  . Occupation: retired    Comment: Pharmacist, hospital, kindergarten  Tobacco Use  . Smoking status: Never Smoker  . Smokeless tobacco: Never Used  Vaping Use  . Vaping Use: Never used  Substance and Sexual Activity  . Alcohol use: Never    Alcohol/week: 0.0 standard drinks  . Drug use: Never  . Sexual activity: Not Currently    Birth control/protection: None    Comment: lives alone, avoids  daiiry and gluten. 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 Not on file  Social History Narrative  . Not on file   Social Determinants of Health   Financial Resource Strain:   . Difficulty of Paying Living Expenses:   Food Insecurity:   . Worried About Charity fundraiser in the Last Year:   . Arboriculturist in the Last Year:   Transportation Needs:   . Film/video editor (Medical):   Marland Kitchen Lack of Transportation (Non-Medical):   Physical Activity:   . Days of Exercise per Week:   . Minutes of Exercise per Session:   Stress:   . Feeling of Stress :   Social Connections:   . Frequency of Communication with Friends and Family:   . Frequency of Social Gatherings with Friends and Family:   . Attends Religious Services:   . Active Member of Clubs or Organizations:   . Attends Archivist Meetings:   Marland Kitchen Marital Status:   Intimate Partner Violence:   . Fear of Current or Ex-Partner:   . Emotionally Abused:   Marland Kitchen Physically Abused:   . Sexually Abused:     Outpatient Medications Prior to Visit  Medication Sig Dispense Refill  . albuterol (VENTOLIN HFA) 108 (90 Base) MCG/ACT inhaler Inhale 2 puffs into the lungs every 6 (six) hours as needed for wheezing or shortness of breath. 18 g 2  . ASPIRIN 81 PO 81 mg 2 (two) times daily.    Marland Kitchen atorvastatin (LIPITOR) 20 MG tablet Take 1 tablet (20 mg total) by mouth daily. 90 tablet 3  . citalopram (CELEXA) 20 MG tablet TAKE 1 TABLET BY MOUTH AT BEDTIME 90 tablet 1  . dicyclomine (BENTYL) 10 MG capsule TAKE 1 CAPSULE BY MOUTH EVERY 8 HOURS AS NEEDED FOR SPASMS 60 capsule 1  . fluticasone (FLONASE) 50 MCG/ACT nasal spray Place 2 sprays into both nostrils daily as needed for allergies or rhinitis. 16 g 5  . furosemide (LASIX) 40 MG tablet Take 1 tablet by mouth once daily 90 tablet 0  . hyoscyamine (LEVSIN SL) 0.125 MG SL tablet DISSOLVE 1 TABLET UNDER THE TONGUE EVERY 6 HOURS AS NEEDED 30 tablet 0  . levothyroxine (SYNTHROID)  50 MCG tablet Take 1 tablet by mouth once daily 90 tablet 1  . lisinopril (ZESTRIL) 5 MG tablet Take 1 tablet by mouth twice daily 180 tablet 1  . metoprolol succinate (TOPROL-XL) 25 MG 24 hr tablet Take 1 tablet (25 mg total) by mouth at bedtime. 90 tablet 1  . omega-3 acid ethyl esters (LOVAZA) 1 g capsule Take 2 g by mouth daily.    Marland Kitchen omeprazole (PRILOSEC) 40 MG capsule  Take 1 capsule (40 mg total) by mouth in the morning and at bedtime. Take 1 tablet by mouth once to twice daily 60 capsule 5  . ondansetron (ZOFRAN) 4 MG tablet Take 1 tablet (4 mg total) by mouth every 8 (eight) hours as needed for nausea or vomiting. 30 tablet 1  . potassium chloride (KLOR-CON) 10 MEQ tablet Take 1 tablet by mouth once daily 90 tablet 1  . pramipexole (MIRAPEX) 0.125 MG tablet Take 1 tablet (0.125 mg total) by mouth 2 (two) times daily. 60 tablet 1  . primidone (MYSOLINE) 50 MG tablet TAKE 1 TABLET BY MOUTH AT BEDTIME 90 tablet 3  . sucralfate (CARAFATE) 1 g tablet Take 1 tablet (1 g total) by mouth 2 (two) times daily. 60 tablet 0  . cetirizine (ZYRTEC) 10 MG tablet Take 1 tablet (10 mg total) by mouth daily as needed for allergies. 30 tablet 5  . colestipol (COLESTID) 1 g tablet Take 1 tablet (1 g total) by mouth 2 (two) times daily. (Patient not taking: Reported on 11/28/2019) 60 tablet 2  . ferrous sulfate (FERROUSUL) 325 (65 FE) MG tablet Take 1 tablet (325 mg total) by mouth 3 (three) times daily with meals for 14 days. 42 tablet 0  . fluticasone (FLOVENT HFA) 110 MCG/ACT inhaler Inhale 1 puff into the lungs 2 (two) times daily. 1 Inhaler 12  . sucralfate (CARAFATE) 1 g tablet Take 1 tablet (1 g total) by mouth 2 (two) times daily. 60 tablet 1   No facility-administered medications prior to visit.    No Known Allergies  Review of Systems  Constitutional: Negative for fever and malaise/fatigue.  HENT: Negative for congestion.   Eyes: Negative for blurred vision.  Respiratory: Negative for shortness of  breath.   Cardiovascular: Negative for chest pain, palpitations and leg swelling.  Gastrointestinal: Positive for abdominal pain. Negative for blood in stool, nausea and vomiting.  Genitourinary: Negative for dysuria and frequency.  Musculoskeletal: Negative for falls.  Skin: Negative for rash.  Neurological: Negative for dizziness, loss of consciousness and headaches.  Endo/Heme/Allergies: Negative for environmental allergies.  Psychiatric/Behavioral: Negative for depression. The patient is not nervous/anxious.        Objective:    Physical Exam  BP 108/60 (BP Location: Left Arm, Cuff Size: Normal)   Pulse (!) 54   Temp 98 F (36.7 C) (Oral)   Resp 12   Ht 4\' 10"  (1.473 m)   Wt 123 lb 3.2 oz (55.9 kg)   SpO2 95%   BMI 25.75 kg/m  Wt Readings from Last 3 Encounters:  11/29/19 123 lb 3.2 oz (55.9 kg)  11/28/19 123 lb (55.8 kg)  11/23/19 123 lb 9.6 oz (56.1 kg)    Diabetic Foot Exam - Simple   No data filed     Lab Results  Component Value Date   WBC 5.2 11/16/2019   HGB 11.3 (L) 11/16/2019   HCT 33.8 (L) 11/16/2019   PLT 185.0 11/16/2019   GLUCOSE 80 11/16/2019   CHOL 151 09/18/2019   TRIG 92.0 09/18/2019   HDL 58.00 09/18/2019   LDLCALC 74 09/18/2019   ALT 17 11/16/2019   AST 23 11/16/2019   NA 138 11/16/2019   K 4.1 11/16/2019   CL 101 11/16/2019   CREATININE 1.22 (H) 11/16/2019   BUN 30 (H) 11/16/2019   CO2 26 11/16/2019   TSH 1.84 09/18/2019   INR 1.2 02/16/2019   HGBA1C 6.0 09/18/2019    Lab Results  Component Value Date  TSH 1.84 09/18/2019   Lab Results  Component Value Date   WBC 5.2 11/16/2019   HGB 11.3 (L) 11/16/2019   HCT 33.8 (L) 11/16/2019   MCV 90.2 11/16/2019   PLT 185.0 11/16/2019   Lab Results  Component Value Date   NA 138 11/16/2019   K 4.1 11/16/2019   CO2 26 11/16/2019   GLUCOSE 80 11/16/2019   BUN 30 (H) 11/16/2019   CREATININE 1.22 (H) 11/16/2019   BILITOT 0.5 11/16/2019   ALKPHOS 65 11/16/2019   AST 23  11/16/2019   ALT 17 11/16/2019   PROT 7.2 11/16/2019   ALBUMIN 4.4 11/16/2019   CALCIUM 9.2 11/16/2019   ANIONGAP 11 02/24/2019   GFR 42.63 (L) 11/16/2019   Lab Results  Component Value Date   CHOL 151 09/18/2019   Lab Results  Component Value Date   HDL 58.00 09/18/2019   Lab Results  Component Value Date   LDLCALC 74 09/18/2019   Lab Results  Component Value Date   TRIG 92.0 09/18/2019   Lab Results  Component Value Date   CHOLHDL 3 09/18/2019   Lab Results  Component Value Date   HGBA1C 6.0 09/18/2019       Assessment & Plan:   Problem List Items Addressed This Visit    Essential hypertension (Chronic)    Well controlled, no changes to meds. Encouraged heart healthy diet such as the DASH diet and exercise as tolerated.       Hyperlipidemia    Encouraged heart healthy diet, increase exercise, avoid trans fats, consider a krill oil cap daily. Tolerating Atorvastatin      Hyperglycemia    hgba1c acceptable, minimize simple carbs. Increase exercise as tolerated.       Abdominal pain    Improving and responds to Hyoscyamine prn. Decreased diarrhea and no fevers or nausea or vomiting.       Left hip pain    Encouraged moist heat and gentle stretching as tolerated. May try NSAIDs and prescription meds as directed and report if symptoms worsen or seek immediate care. Xray ordered      Right hip pain - Primary   Relevant Orders   DG Hip Unilat W OR W/O Pelvis 2-3 Views Right (Completed)      I have discontinued Van Clines. Schoonmaker's ferrous sulfate, cetirizine, and colestipol. I am also having her maintain her omega-3 acid ethyl esters, atorvastatin, ondansetron, albuterol, ASPIRIN 81 PO, pramipexole, metoprolol succinate, levothyroxine, primidone, furosemide, fluticasone, citalopram, potassium chloride, lisinopril, hyoscyamine, sucralfate, omeprazole, and dicyclomine.  No orders of the defined types were placed in this encounter.    Penni Homans, MD

## 2019-12-03 NOTE — Assessment & Plan Note (Signed)
Well controlled, no changes to meds. Encouraged heart healthy diet such as the DASH diet and exercise as tolerated.  °

## 2019-12-03 NOTE — Assessment & Plan Note (Signed)
Encouraged heart healthy diet, increase exercise, avoid trans fats, consider a krill oil cap daily. Tolerating Atorvastatin 

## 2019-12-11 NOTE — Progress Notes (Signed)
Subjective:   Julie Wilkerson is a 79 y.o. female who presents for Medicare Annual (Subsequent) preventive examination.  Review of Systems     Cardiac Risk Factors include: advanced age (>84men, >8 women);dyslipidemia;hypertension     Objective:    Today's Vitals   12/12/19 0949  BP: 116/60  Pulse: (!) 58  Temp: (!) 97 F (36.1 C)  TempSrc: Temporal  SpO2: 98%  Weight: 123 lb 9.6 oz (56.1 kg)  Height: 4\' 10"  (1.473 m)   Body mass index is 25.83 kg/m.  Advanced Directives 12/12/2019 02/23/2019 02/22/2019 02/16/2019 02/15/2019 02/13/2019 02/09/2019  Does Patient Have a Medical Advance Directive? Yes Yes Yes Yes Yes Yes Yes  Type of Paramedic of [place];Living will Living will Living will Living will Living will Living will Living will  Does patient want to make changes to medical advance directive? No - Patient declined No - Patient declined - No - Patient declined - No - Patient declined -  Copy of Dick[place] in Chart? No - copy requested - - - - - -  Would patient like information on creating a medical advance directive? - No - Patient declined - No - Patient declined No - Patient declined No - Patient declined No - Patient declined    Current Medications (verified) Outpatient Encounter Medications as of 12/12/2019  Medication Sig  . albuterol (VENTOLIN HFA) 108 (90 Base) MCG/ACT inhaler Inhale 2 puffs into the lungs every 6 (six) hours as needed for wheezing or shortness of breath.  . ASPIRIN 81 PO 81 mg 2 (two) times daily.  Marland Kitchen atorvastatin (LIPITOR) 20 MG tablet Take 1 tablet (20 mg total) by mouth daily.  . citalopram (CELEXA) 20 MG tablet TAKE 1 TABLET BY MOUTH AT BEDTIME  . dicyclomine (BENTYL) 10 MG capsule TAKE 1 CAPSULE BY MOUTH EVERY 8 HOURS AS NEEDED FOR SPASMS  . fluticasone (FLONASE) 50 MCG/ACT nasal spray Place 2 sprays into both nostrils daily as needed for allergies or rhinitis.  .  furosemide (LASIX) 40 MG tablet Take 1 tablet by mouth once daily  . hyoscyamine (LEVSIN SL) 0.125 MG SL tablet DISSOLVE 1 TABLET UNDER THE TONGUE EVERY 6 HOURS AS NEEDED  . levothyroxine (SYNTHROID) 50 MCG tablet Take 1 tablet by mouth once daily  . lisinopril (ZESTRIL) 5 MG tablet Take 1 tablet by mouth twice daily  . metoprolol succinate (TOPROL-XL) 25 MG 24 hr tablet Take 1 tablet (25 mg total) by mouth at bedtime.  Marland Kitchen omega-3 acid ethyl esters (LOVAZA) 1 g capsule Take 2 g by mouth daily.  Marland Kitchen omeprazole (PRILOSEC) 40 MG capsule Take 1 capsule (40 mg total) by mouth in the morning and at bedtime. Take 1 tablet by mouth once to twice daily  . ondansetron (ZOFRAN) 4 MG tablet Take 1 tablet (4 mg total) by mouth every 8 (eight) hours as needed for nausea or vomiting.  . potassium chloride (KLOR-CON) 10 MEQ tablet Take 1 tablet by mouth once daily  . pramipexole (MIRAPEX) 0.125 MG tablet Take 1 tablet (0.125 mg total) by mouth 2 (two) times daily.  . primidone (MYSOLINE) 50 MG tablet TAKE 1 TABLET BY MOUTH AT BEDTIME  . sucralfate (CARAFATE) 1 g tablet Take 1 tablet (1 g total) by mouth 2 (two) times daily.   No facility-administered  encounter medications on file as of 12/12/2019.    Allergies (verified) Patient has no known allergies.   History: Past Medical History:  Diagnosis Date  . Anemia   . Anginal pain ([place])   . Anxiety   . Arthritis   . Asthma   . Blood transfusion without reported diagnosis   . Breast cancer ([place])    2002, left, encapsulated, microcalcifications. lumpectomy, radtiation x 30  . Breast cancer in female [facility])   . Change in mole 06/08/2016  . CHF (congestive heart failure) ([place])   . Colitis   . Complication of anesthesia    VERY SENSITIVE TO ANESTHESIA   . Decreased visual acuity 01/16/2017  . Dyspnea 11/04/2016   with asthma exacerbation only  . Dysuria 11/04/2016  . Gluten intolerance   . History of chicken pox   . History of Helicobacter pylori infection  08/01/2015  . Hyperlipidemia   . Hypertension   . Hypothyroidism   . IBS (irritable bowel syndrome)   . Left leg pain 03/01/2017  . Low back pain 08/10/2015  . 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 Neck pain 03/01/2017  . Osteoporosis   . Personal history of radiation therapy   . Pneumonia   . PONV (postoperative nausea and vomiting)   . RLQ discomfort 03/01/2017  . Stomach cramps   . Urinary tract infection 11/04/2016   Past Surgical History:  Procedure Laterality Date  . ABDOMINAL HYSTERECTOMY     partial  . APPENDECTOMY    . BREAST LUMPECTOMY Left 2002  . CHOLECYSTECTOMY  Age 28 or 23  . COLONOSCOPY  2017  . EYE SURGERY Left    cataract  . LEFT HEART CATH AND CORONARY ANGIOGRAPHY N/A 02/09/2019   Procedure: LEFT HEART CATH AND CORONARY ANGIOGRAPHY;  Surgeon: Nelva Bush, MD;  Location: [facility];  Service: Cardiovascular;  Laterality: N/A;  . TONSILLECTOMY    . TOTAL HIP ARTHROPLASTY Left 02/13/2019   Procedure: TOTAL HIP ARTHROPLASTY ANTERIOR APPROACH;  Surgeon: Paralee Cancel, MD;  Location: [facility];  Service: Orthopedics;  Laterality: Left;  70 mins   Family History  Problem Relation Age of Onset  . Colitis Mother   . Irritable bowel syndrome Mother   . Hypertension Father   . Hyperlipidemia Brother   . Heart attack Brother   . Stomach cancer Paternal Grandmother 81  . Colon cancer Paternal Grandmother   . Hyperlipidemia Brother   . Heart attack Brother   . Hyperlipidemia Brother   . Heart attack Brother   . Hyperlipidemia Sister   . Leukemia Daughter   . Arthritis Daughter   . Heart disease Daughter        ASD vs VSD  . Esophageal cancer Neg Hx    Social History   Socioeconomic History  . Marital status: Single    Spouse name: Not on file  . Number of children: 3  . Years of education: 95  . Highest education level: Master's degree (e.g., MA, MS, MEng, MEd, MSW, MBA)  Occupational History  . Occupation: retired    Comment:  Pharmacist, hospital, kindergarten  Tobacco Use  . Smoking status: Never Smoker  . Smokeless tobacco: Never Used  Vaping Use  . Vaping Use: Never used  Substance and Sexual Activity  . Alcohol use: Never    Alcohol/week: 0.0 standard drinks  . Drug use: Never  . Sexual activity: Not Currently    Birth control/protection: None    Comment: lives alone, avoids daiiry and  gluten. 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 Not on file  Social History Narrative  . Not on file   Social Determinants of Health   Financial Resource Strain:   . Difficulty of Paying Living Expenses:   Food Insecurity:   . Worried About Charity fundraiser in the Last Year:   . Arboriculturist in the Last Year:   Transportation Needs:   . Film/video editor (Medical):   Marland Kitchen Lack of Transportation (Non-Medical):   Physical Activity:   . Days of Exercise per Week:   . Minutes of Exercise per Session:   Stress:   . Feeling of Stress :   Social Connections:   . Frequency of Communication with Friends and Family:   . Frequency of Social Gatherings with Friends and Family:   . Attends Religious Services:   . Active Member of Clubs or Organizations:   . Attends Archivist Meetings:   Marland Kitchen Marital Status:     Tobacco Counseling Counseling given: Not Answered   Clinical Intake:  Pain : No/denies pain    Activities of Daily Living In your present state of health, do you have any difficulty performing the following activities: 12/12/2019 02/23/2019  Hearing? N N  Vision? N N  Difficulty concentrating or making decisions? N N  Walking or climbing stairs? N Y  Comment - s/p left hip surgery  Dressing or bathing? N N  Doing errands, shopping? N N  Preparing Food and eating ? N -  Using the Toilet? N -  In the past six months, have you accidently leaked urine? Y -  Do you have problems with loss of bowel control? N -  Managing your Medications? N -  Managing your Finances? N -  Housekeeping or  managing your Housekeeping? N -  Some recent data might be hidden    Patient Care Team: [name], MD as PCP - General (Family Medicine) Berniece Salines, DO as PCP - Cardiology (Cardiology) Lake Bells., MD as Consulting Physician (Gastroenterology) Belva Bertin, [place] as Physician Assistant (Family Medicine)  Indicate any recent Medical Services you may have received from other than [facility] providers in the past year (date may be approximate).     Assessment:   This is a routine wellness examination for Pina.  Dietary issues and exercise activities discussed: Current Exercise Habits: The patient does not participate in regular exercise at present, Exercise limited by: None identified Diet (meal preparation, eat out, water intake, caffeinated beverages, dairy products, fruits and vegetables): well balanced   Goals    . Maintain healthy lifestyle    . Restart exercising 4x/ week.      Depression Screen PHQ 2/9 Scores 12/12/2019 02/01/2019 07/06/2018 06/27/2017 04/26/2017 04/29/2016 02/05/2016  PHQ - 2 Score 0 0 0 0 0 0 0  PHQ- 9 Score - - - - 0 - -    Fall Risk Fall Risk  12/12/2019 02/01/2019 01/16/2019 07/14/2018 07/06/2018  Falls in the past year? 0 0 0 0 0  Number falls in past yr: 0 0 0 0 -  Injury with Fall? 0 0 0 0 -  Follow up Education provided;Falls prevention discussed - - Falls evaluation completed -   Lives alone in 1 story home. Dtr lives close by. Any stairs in or around the home? No  If so, are there any without handrails? No  Home free of loose throw rugs in walkways, pet beds, electrical cords, etc?  Yes  Adequate lighting in your home to reduce risk of falls? Yes   ASSISTIVE DEVICES UTILIZED TO PREVENT FALLS: none   Gait steady and fast without use of assistive device  Cognitive Function:   MMSE - Mini Mental State Exam 06/27/2017 04/29/2016  Orientation to time 5 5  Orientation to Place 5 5  Registration 3 3  Attention/ Calculation 5 5  Recall 3 3    Language- name 2 objects 2 2  Language- repeat 1 1  Language- follow 3 step command 3 3  Language- read & follow direction 1 1  Write a sentence 1 1  Copy design 1 1  Total score 30 30   Montreal Cognitive Assessment  07/14/2018  Visuospatial/ Executive (0/5) 4  Naming (0/3) 3  Attention: Read list of digits (0/2) 1  Attention: Read list of letters (0/1) 1  Attention: Serial 7 subtraction starting at 100 (0/3) 3  Language: Repeat phrase (0/2) 1  Language : Fluency (0/1) 0  Abstraction (0/2) 1  Delayed Recall (0/5) 4  Orientation (0/6) 6  Total 24  Adjusted Score (based on education) 24   6CIT Screen 12/12/2019  What Year? 0 points  What month? 0 points  What time? 0 points  Count back from 20 0 points  Months in reverse 0 points  Repeat phrase 0 points  Total Score 0    Immunizations Immunization History  Administered Date(s) Administered  . Fluad Quad(high Dose 65+) 02/01/2019  . Influenza, High Dose Seasonal PF 03/11/2015, 02/05/2016, 02/05/2017, 03/23/2018  . Influenza,inj,quad, With Preservative 05/23/2014, 03/24/2018  . Pneumococcal Conjugate-13 02/01/2019  . Pneumococcal Polysaccharide-23 05/23/2014  . Td 02/05/2016  . Zoster 05/24/2009    TDAP status: Up to date Flu Vaccine status: Up to date Pneumococcal vaccine status: Up to date    Screening Tests Health Maintenance  Topic Date Due  . Hepatitis C Screening  Never done  . COVID-19 Vaccine (1) Never done  . INFLUENZA VACCINE  12/23/2019  . TETANUS/TDAP  02/04/2026  . DEXA SCAN  Completed  . PNA vac Low Risk Adult  Completed    Health Maintenance  Health Maintenance Due  Topic Date Due  . Hepatitis C Screening  Never done  . COVID-19 Vaccine (1) Never done    Colorectal cancer screening: Completed 08/14/19. Repeat every (not on file) years Mammogram status: Completed 04/27/19. Repeat every year Bone Density status: Completed 09/11/18. Results reflect: Bone density results: OSTEOPENIA. Repeat  every 2 years.  Lung Cancer Screening: (Low Dose CT Chest recommended if Age 33-80 years, 30 pack-year currently smoking OR have quit w/in 15years.) does not qualify.  Additional Screening:  Vision Screening: Recommended annual ophthalmology exams for early detection of glaucoma and other disorders of the eye. Is the patient up to date with their annual eye exam?  Yes   Dental Screening: Recommended annual dental exams for proper oral hygiene  Community Resource Referral / Chronic Care Management: CRR required this visit?  No   CCM required this visit?  No      Plan:    Please schedule your next medicare wellness visit with me in 1 yr.  Continue to eat heart healthy diet (full of fruits, vegetables, whole grains, lean protein, water--limit salt, fat, and sugar intake) and increase physical activity as tolerated.  Bring a copy of your living will and/or healthcare power of attorney to your next office visit.  Continue doing brain stimulating activities (puzzles, reading, adult coloring books, staying active) to keep  memory sharp.   I have personally reviewed and noted the following in the patient's chart:   . Medical and social history . Use of alcohol, tobacco or illicit drugs  . Current medications and supplements . Functional ability and status . Nutritional status . Physical activity . Advanced directives . List of other physicians . Hospitalizations, surgeries, and ER visits in previous 12 months . Vitals . Screenings to include cognitive, depression, and falls . Referrals and appointments  In addition, I have reviewed and discussed with patient certain preventive protocols, quality metrics, and best practice recommendations. A written personalized care plan for preventive services as well as general preventive health recommendations were provided to patient.   Pt request to view avs on mychart. Declines printout.   Naaman Plummer [place], [place]   12/12/2019

## 2019-12-12 ENCOUNTER — Encounter: Payer: Self-pay | Admitting: *Deleted

## 2019-12-12 ENCOUNTER — Ambulatory Visit (INDEPENDENT_AMBULATORY_CARE_PROVIDER_SITE_OTHER): Payer: Medicare HMO | Admitting: *Deleted

## 2019-12-12 ENCOUNTER — Other Ambulatory Visit: Payer: Self-pay

## 2019-12-12 VITALS — BP 116/60 | HR 58 | Temp 97.0°F | Ht <= 58 in | Wt 123.6 lb

## 2019-12-12 DIAGNOSIS — Z0000 Encounter for general adult medical examination without abnormal findings: Secondary | ICD-10-CM

## 2019-12-12 NOTE — Patient Instructions (Signed)
Please schedule your next medicare wellness visit with me in 1 yr.  Continue to eat heart healthy diet (full of fruits, vegetables, whole grains, lean protein, water--limit salt, fat, and sugar intake) and increase physical activity as tolerated.  Bring a copy of your living will and/or healthcare power of attorney to your next office visit.  Continue doing brain stimulating activities (puzzles, reading, adult coloring books, staying active) to keep memory sharp.    [name] , Thank you for taking time to come for your Medicare Wellness Visit. I appreciate your ongoing commitment to your health goals. Please review the following plan we discussed and let me know if I can assist you in the future.   These are the goals we discussed: Goals    . Maintain healthy lifestyle    . Restart exercising 4x/ week.       This is a list of the screening recommended for you and due dates:  Health Maintenance  Topic Date Due  .  Hepatitis C: One time screening is recommended by Center for Disease Control  (CDC) for  adults born from [birth year] through 1964.   Never done  . COVID-19 Vaccine (1) Never done  . Flu Shot  12/23/2019  . Tetanus Vaccine  02/04/2026  . DEXA scan (bone density measurement)  Completed  . Pneumonia vaccines  Completed    Preventive Care 38 Years and Older, Female Preventive care refers to lifestyle choices and visits with your health care provider that can promote health and wellness. This includes:  A yearly physical exam. This is also called an annual well check.  Regular dental and eye exams.  Immunizations.  Screening for certain conditions.  Healthy lifestyle choices, such as diet and exercise. What can I expect for my preventive care visit? Physical exam Your health care provider will check:  Height and weight. These may be used to calculate body mass index (BMI), which is a measurement that tells if you are at a healthy weight.  Heart rate and blood  pressure.  Your skin for abnormal spots. Counseling Your health care provider may ask you questions about:  Alcohol, tobacco, and drug use.  Emotional well-being.  Home and relationship well-being.  Sexual activity.  Eating habits.  History of falls.  Memory and ability to understand (cognition).  Work and work Statistician.  Pregnancy and menstrual history. What immunizations do I need?  Influenza (flu) vaccine  This is recommended every year. Tetanus, diphtheria, and pertussis (Tdap) vaccine  You may need a Td booster every 10 years. Varicella (chickenpox) vaccine  You may need this vaccine if you have not already been vaccinated. Zoster (shingles) vaccine  You may need this after age 2. Pneumococcal conjugate (PCV13) vaccine  One dose is recommended after age 5. Pneumococcal polysaccharide (PPSV23) vaccine  One dose is recommended after age 31. Measles, mumps, and rubella (MMR) vaccine  You may need at least one dose of MMR if you were born in 1957 or later. You may also need a second dose. Meningococcal conjugate (MenACWY) vaccine  You may need this if you have certain conditions. Hepatitis A vaccine  You may need this if you have certain conditions or if you travel or work in places where you may be exposed to hepatitis A. Hepatitis B vaccine  You may need this if you have certain conditions or if you travel or work in places where you may be exposed to hepatitis B. Haemophilus influenzae type b (Hib) vaccine  You  may need this if you have certain conditions. You may receive vaccines as individual doses or as more than one vaccine together in one shot (combination vaccines). Talk with your health care provider about the risks and benefits of combination vaccines. What tests do I need? Blood tests  Lipid and cholesterol levels. These may be checked every 5 years, or more frequently depending on your overall health.  Hepatitis C test.  Hepatitis B  test. Screening  Lung cancer screening. You may have this screening every year starting at age 81 if you have a 30-pack-year history of smoking and currently smoke or have quit within the past 15 years.  Colorectal cancer screening. All adults should have this screening starting at age 69 and continuing until age 1. Your health care provider may recommend screening at age 8 if you are at increased risk. You will have tests every 1-10 years, depending on your results and the type of screening test.  Diabetes screening. This is done by checking your blood sugar (glucose) after you have not eaten for a while (fasting). You may have this done every 1-3 years.  Mammogram. This may be done every 1-2 years. Talk with your health care provider about how often you should have regular mammograms.  BRCA-related cancer screening. This may be done if you have a family history of breast, ovarian, tubal, or peritoneal cancers. Other tests  Sexually transmitted disease (STD) testing.  Bone density scan. This is done to screen for osteoporosis. You may have this done starting at age 57. Follow these instructions at home: Eating and drinking  Eat a diet that includes fresh fruits and vegetables, whole grains, lean protein, and low-fat dairy products. Limit your intake of foods with high amounts of sugar, saturated fats, and salt.  Take vitamin and mineral supplements as recommended by your health care provider.  Do not drink alcohol if your health care provider tells you not to drink.  If you drink alcohol: ? Limit how much you have to 0-1 drink a day. ? Be aware of how much alcohol is in your drink. In the [place] one drink equals one 12 oz bottle of beer (355 mL), one 5 oz glass of wine (148 mL), or one 1 oz glass of hard liquor (44 mL). Lifestyle  Take daily care of your teeth and gums.  Stay active. Exercise for at least 30 minutes on 5 or more days each week.  Do not use any products that  contain nicotine or tobacco, such as cigarettes, e-cigarettes, and chewing tobacco. If you need help quitting, ask your health care provider.  If you are sexually active, practice safe sex. Use a condom or other form of protection in order to prevent STIs (sexually transmitted infections).  Talk with your health care provider about taking a low-dose aspirin or statin. What's next?  Go to your health care provider once a year for a well check visit.  Ask your health care provider how often you should have your eyes and teeth checked.  Stay up to date on all vaccines. This information is not intended to replace advice given to you by your health care provider. Make sure you discuss any questions you have with your health care provider. Document Revised: 05/04/2018 Document Reviewed: 05/04/2018 Elsevier Patient Education  2020 [employer].

## 2019-12-13 DIAGNOSIS — R69 Illness, unspecified: Secondary | ICD-10-CM | POA: Diagnosis not present

## 2019-12-19 ENCOUNTER — Other Ambulatory Visit: Payer: Self-pay | Admitting: Family Medicine

## 2019-12-19 DIAGNOSIS — M0579 Rheumatoid arthritis with rheumatoid factor of multiple sites without organ or systems involvement: Secondary | ICD-10-CM | POA: Diagnosis not present

## 2019-12-19 DIAGNOSIS — R768 Other specified abnormal immunological findings in serum: Secondary | ICD-10-CM | POA: Diagnosis not present

## 2019-12-19 DIAGNOSIS — M7989 Other specified soft tissue disorders: Secondary | ICD-10-CM | POA: Diagnosis not present

## 2019-12-19 DIAGNOSIS — Z6823 Body mass index (BMI) 23.0-23.9, adult: Secondary | ICD-10-CM | POA: Diagnosis not present

## 2020-01-24 ENCOUNTER — Other Ambulatory Visit: Payer: Self-pay | Admitting: Family Medicine

## 2020-01-31 ENCOUNTER — Other Ambulatory Visit: Payer: Self-pay

## 2020-01-31 ENCOUNTER — Telehealth (INDEPENDENT_AMBULATORY_CARE_PROVIDER_SITE_OTHER): Payer: Medicare HMO | Admitting: Family Medicine

## 2020-01-31 VITALS — BP 119/64 | HR 60 | Ht <= 58 in

## 2020-01-31 DIAGNOSIS — R197 Diarrhea, unspecified: Secondary | ICD-10-CM

## 2020-01-31 DIAGNOSIS — I10 Essential (primary) hypertension: Secondary | ICD-10-CM

## 2020-01-31 DIAGNOSIS — E559 Vitamin D deficiency, unspecified: Secondary | ICD-10-CM | POA: Diagnosis not present

## 2020-01-31 DIAGNOSIS — E039 Hypothyroidism, unspecified: Secondary | ICD-10-CM | POA: Diagnosis not present

## 2020-01-31 DIAGNOSIS — R300 Dysuria: Secondary | ICD-10-CM

## 2020-01-31 DIAGNOSIS — R739 Hyperglycemia, unspecified: Secondary | ICD-10-CM | POA: Diagnosis not present

## 2020-01-31 DIAGNOSIS — E782 Mixed hyperlipidemia: Secondary | ICD-10-CM

## 2020-01-31 DIAGNOSIS — M1990 Unspecified osteoarthritis, unspecified site: Secondary | ICD-10-CM

## 2020-02-01 ENCOUNTER — Other Ambulatory Visit (INDEPENDENT_AMBULATORY_CARE_PROVIDER_SITE_OTHER): Payer: Medicare HMO

## 2020-02-01 ENCOUNTER — Other Ambulatory Visit: Payer: Self-pay

## 2020-02-01 DIAGNOSIS — I10 Essential (primary) hypertension: Secondary | ICD-10-CM | POA: Diagnosis not present

## 2020-02-01 DIAGNOSIS — E782 Mixed hyperlipidemia: Secondary | ICD-10-CM | POA: Diagnosis not present

## 2020-02-01 DIAGNOSIS — R300 Dysuria: Secondary | ICD-10-CM

## 2020-02-01 DIAGNOSIS — R197 Diarrhea, unspecified: Secondary | ICD-10-CM

## 2020-02-01 NOTE — Addendum Note (Signed)
Addended by: Kelle Darting A on: 02/01/2020 03:06 PM   Modules accepted: Orders

## 2020-02-02 LAB — COMPREHENSIVE METABOLIC PANEL
AG Ratio: 1.6 (calc) (ref 1.0–2.5)
ALT: 16 U/L (ref 6–29)
AST: 28 U/L (ref 10–35)
Albumin: 4.2 g/dL (ref 3.6–5.1)
Alkaline phosphatase (APISO): 75 U/L (ref 37–153)
BUN/Creatinine Ratio: 21 (calc) (ref 6–22)
BUN: 28 mg/dL — ABNORMAL HIGH (ref 7–25)
CO2: 26 mmol/L (ref 20–32)
Calcium: 8.9 mg/dL (ref 8.6–10.4)
Chloride: 101 mmol/L (ref 98–110)
Creat: 1.34 mg/dL — ABNORMAL HIGH (ref 0.60–0.93)
Globulin: 2.7 g/dL (calc) (ref 1.9–3.7)
Glucose, Bld: 85 mg/dL (ref 65–99)
Potassium: 4 mmol/L (ref 3.5–5.3)
Sodium: 139 mmol/L (ref 135–146)
Total Bilirubin: 0.5 mg/dL (ref 0.2–1.2)
Total Protein: 6.9 g/dL (ref 6.1–8.1)

## 2020-02-02 LAB — TSH: TSH: 1.24 mIU/L (ref 0.40–4.50)

## 2020-02-02 LAB — CBC WITH DIFFERENTIAL/PLATELET
Absolute Monocytes: 765 cells/uL (ref 200–950)
Basophils Absolute: 9 cells/uL (ref 0–200)
Basophils Relative: 0.2 %
Eosinophils Absolute: 0 cells/uL — ABNORMAL LOW (ref 15–500)
Eosinophils Relative: 0 %
HCT: 35.8 % (ref 35.0–45.0)
Hemoglobin: 11.3 g/dL — ABNORMAL LOW (ref 11.7–15.5)
Lymphs Abs: 1301 cells/uL (ref 850–3900)
MCH: 28.8 pg (ref 27.0–33.0)
MCHC: 31.6 g/dL — ABNORMAL LOW (ref 32.0–36.0)
MCV: 91.3 fL (ref 80.0–100.0)
MPV: 13.3 fL — ABNORMAL HIGH (ref 7.5–12.5)
Monocytes Relative: 17 %
Neutro Abs: 2426 cells/uL (ref 1500–7800)
Neutrophils Relative %: 53.9 %
Platelets: 200 10*3/uL (ref 140–400)
RBC: 3.92 10*6/uL (ref 3.80–5.10)
RDW: 12.8 % (ref 11.0–15.0)
Total Lymphocyte: 28.9 %
WBC: 4.5 10*3/uL (ref 3.8–10.8)

## 2020-02-02 LAB — URINALYSIS
Bilirubin Urine: NEGATIVE
Glucose, UA: NEGATIVE
Hgb urine dipstick: NEGATIVE
Ketones, ur: NEGATIVE
Leukocytes,Ua: NEGATIVE
Nitrite: NEGATIVE
Protein, ur: NEGATIVE
Specific Gravity, Urine: 1.013 (ref 1.001–1.03)
pH: 5.5 (ref 5.0–8.0)

## 2020-02-02 LAB — LIPID PANEL
Cholesterol: 163 mg/dL (ref <200)
HDL: 62 mg/dL (ref >=50)
LDL Cholesterol (Calc): 79 mg/dL (calc)
Non-HDL Cholesterol (Calc): 101 mg/dL (calc) (ref <130)
Total CHOL/HDL Ratio: 2.6 (calc) (ref <5.0)
Triglycerides: 128 mg/dL (ref <150)

## 2020-02-02 LAB — URINE CULTURE
MICRO NUMBER:: 10935546
Result:: NO GROWTH
SPECIMEN QUALITY:: ADEQUATE

## 2020-02-04 DIAGNOSIS — R300 Dysuria: Secondary | ICD-10-CM | POA: Insufficient documentation

## 2020-02-04 NOTE — Assessment & Plan Note (Signed)
hgba1c acceptable, minimize simple carbs. Increase exercise as tolerated.  

## 2020-02-04 NOTE — Assessment & Plan Note (Signed)
On Levothyroxine, continue to monitor 

## 2020-02-04 NOTE — Progress Notes (Signed)
Subjective:    Patient ID: Julie Wilkerson, female    DOB: 02-24-1941, 79 y.o.   MRN: [id number]  Chief Complaint  Patient presents with  . 2 month follow up    HPI Patient is in today for follow up on chronic medical concerns. She continues to struggle with intermittent abdominal pain and diarrhea. No bloody or tarry stool. No fevers or chills. She does endorse fatigue and anxiety. She is also noting some dysuria and urinary frequency.  Denies CP/palp/SOB/HA/congestion/fevers.. Taking meds as prescribed. She is frustrated with how poorly she feels.   Past Medical History:  Diagnosis Date  . Anemia   . Anginal pain ([place])   . Anxiety   . Arthritis   . Asthma   . Blood transfusion without reported diagnosis   . Breast cancer ([place])    2002, left, encapsulated, microcalcifications. lumpectomy, radtiation x 30  . Breast cancer in female [facility])   . Change in mole 06/08/2016  . CHF (congestive heart failure) ([place])   . Colitis   . Complication of anesthesia    VERY SENSITIVE TO ANESTHESIA   . Decreased visual acuity 01/16/2017  . Dyspnea 11/04/2016   with asthma exacerbation only  . Dysuria 11/04/2016  . Gluten intolerance   . History of chicken pox   . History of Helicobacter pylori infection 08/01/2015  . Hyperlipidemia   . Hypertension   . Hypothyroidism   . IBS (irritable bowel syndrome)   . Left leg pain 03/01/2017  . Low back pain 08/10/2015  . 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 Neck pain 03/01/2017  . Osteoporosis   . Personal history of radiation therapy   . Pneumonia   . PONV (postoperative nausea and vomiting)   . RLQ discomfort 03/01/2017  . Stomach cramps   . Urinary tract infection 11/04/2016    Past Surgical History:  Procedure Laterality Date  . ABDOMINAL HYSTERECTOMY     partial  . APPENDECTOMY    . BREAST LUMPECTOMY Left 2002  . CHOLECYSTECTOMY  Age 32 or 77  . COLONOSCOPY  2017  . EYE SURGERY Left    cataract  . LEFT HEART CATH  AND CORONARY ANGIOGRAPHY N/A 02/09/2019   Procedure: LEFT HEART CATH AND CORONARY ANGIOGRAPHY;  Surgeon: Nelva Bush, MD;  Location: [facility];  Service: Cardiovascular;  Laterality: N/A;  . TONSILLECTOMY    . 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 Colitis Mother   . Irritable bowel syndrome Mother   . Hypertension Father   . Hyperlipidemia Brother   . Heart attack Brother   . Stomach cancer Paternal Grandmother 71  . Colon cancer Paternal Grandmother   . Hyperlipidemia Brother   . Heart attack Brother   . Hyperlipidemia Brother   . Heart attack Brother   . Hyperlipidemia Sister   . Leukemia Daughter   . Arthritis Daughter   . Heart disease Daughter        ASD vs VSD  . Esophageal cancer Neg Hx     Social History   Socioeconomic History  . Marital status: Single    Spouse name: Not on file  . Number of children: 3  . Years of education: 14  . Highest education level: Master's degree (e.g., MA, MS, MEng,  MEd, MSW, MBA)  Occupational History  . Occupation: retired    Comment: Pharmacist, hospital, kindergarten  Tobacco Use  . Smoking status: Never Smoker  . Smokeless tobacco: Never Used  Vaping Use  . Vaping Use: Never used  Substance and Sexual Activity  . Alcohol use: Never    Alcohol/week: 0.0 standard drinks  . Drug use: Never  . Sexual activity: Not Currently    Birth control/protection: None    Comment: lives alone, avoids daiiry and gluten. 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 Not on file  Social History Narrative  . Not on file   Social Determinants of Health   Financial Resource Strain:   . Difficulty of Paying Living Expenses: Not on file  Food Insecurity:   . Worried About Charity fundraiser in the Last Year: Not on file  . Ran Out  of Food in the Last Year: Not on file  Transportation Needs:   . Lack of Transportation (Medical): Not on file  . Lack of Transportation (Non-Medical): Not on file  Physical Activity:   . Days of Exercise per Week: Not on file  . Minutes of Exercise per Session: Not on file  Stress:   . Feeling of Stress : Not on file  Social Connections:   . Frequency of Communication with Friends and Family: Not on file  . Frequency of Social Gatherings with Friends and Family: Not on file  . Attends Religious Services: Not on file  . Active Member of Clubs or Organizations: Not on file  . Attends Archivist Meetings: Not on file  . Marital Status: Not on file  Intimate Partner Violence:   . Fear of Current or Ex-Partner: Not on file  . Emotionally Abused: Not on file  . Physically Abused: Not on file  . Sexually Abused: Not on file    Outpatient Medications Prior to Visit  Medication Sig Dispense Refill  . albuterol (VENTOLIN HFA) 108 (90 Base) MCG/ACT inhaler Inhale 2 puffs into the lungs every 6 (six) hours as needed for wheezing or shortness of breath. 18 g 2  . ASPIRIN 81 PO 81 mg 2 (two) times daily.    Marland Kitchen atorvastatin (LIPITOR) 20 MG tablet Take 1 tablet (20 mg total) by mouth daily. 90 tablet 3  . citalopram (CELEXA) 20 MG tablet TAKE 1 TABLET BY MOUTH AT BEDTIME 90 tablet 1  . dicyclomine (BENTYL) 10 MG capsule TAKE 1 CAPSULE BY MOUTH EVERY 8 HOURS AS NEEDED FOR SPASMS 60 capsule 1  . fluticasone (FLONASE) 50 MCG/ACT nasal spray Place 2 sprays into both nostrils daily as needed for allergies or rhinitis. 16 g 5  . furosemide (LASIX) 40 MG tablet Take 1 tablet by mouth once daily 90 tablet 0  . hyoscyamine (LEVSIN SL) 0.125 MG SL tablet DISSOLVE 1 TABLET UNDER THE TONGUE EVERY 6 HOURS AS NEEDED 30 tablet 0  . levothyroxine (SYNTHROID) 50 MCG tablet Take 1 tablet by mouth once daily 90 tablet 1  . lisinopril (ZESTRIL) 5 MG tablet Take 1 tablet by mouth twice daily 180 tablet 1  .  metoprolol succinate (TOPROL-XL) 25 MG 24 hr tablet Take 1 tablet (25 mg total) by mouth at bedtime. 90 tablet 1  . omega-3 acid ethyl esters (LOVAZA) 1 g capsule Take 2 g by mouth daily.    Marland Kitchen omeprazole (PRILOSEC) 40 MG capsule Take 1 capsule (40 mg total) by mouth in the morning and at bedtime. Take 1 tablet by mouth  once to twice daily 60 capsule 5  . ondansetron (ZOFRAN) 4 MG tablet Take 1 tablet (4 mg total) by mouth every 8 (eight) hours as needed for nausea or vomiting. 30 tablet 1  . potassium chloride (KLOR-CON) 10 MEQ tablet Take 1 tablet by mouth once daily 90 tablet 1  . pramipexole (MIRAPEX) 0.125 MG tablet Take 1 tablet (0.125 mg total) by mouth 2 (two) times daily. 60 tablet 1  . primidone (MYSOLINE) 50 MG tablet TAKE 1 TABLET BY MOUTH AT BEDTIME 90 tablet 3  . sucralfate (CARAFATE) 1 g tablet Take 1 tablet (1 g total) by mouth 2 (two) times daily. 60 tablet 0   No facility-administered medications prior to visit.    No Known Allergies  Review of Systems  Constitutional: Positive for malaise/fatigue. Negative for fever.  HENT: Negative for congestion.   Eyes: Negative for blurred vision.  Respiratory: Negative for shortness of breath.   Cardiovascular: Negative for chest pain, palpitations and leg swelling.  Gastrointestinal: Positive for abdominal pain and diarrhea. Negative for blood in stool and nausea.  Genitourinary: Negative for dysuria and frequency.  Musculoskeletal: Negative for falls.  Skin: Negative for rash.  Neurological: Positive for tingling, sensory change and focal weakness. Negative for dizziness, loss of consciousness and headaches.  Endo/Heme/Allergies: Negative for environmental allergies.  Psychiatric/Behavioral: Negative for depression. The patient is not nervous/anxious.        Objective:    Physical Exam Vitals and nursing note reviewed.  Constitutional:      General: She is not in acute distress.    Appearance: She is well-developed.    HENT:     Head: Normocephalic and atraumatic.     Nose: Nose normal.  Eyes:     General:        Right eye: No discharge.        Left eye: No discharge.  Cardiovascular:     Rate and Rhythm: Normal rate and regular rhythm.     Heart sounds: No murmur heard.   Pulmonary:     Effort: Pulmonary effort is normal.     Breath sounds: Normal breath sounds.  Abdominal:     General: Bowel sounds are normal.     Palpations: Abdomen is soft.     Tenderness: There is no abdominal tenderness.  Musculoskeletal:     Cervical back: Normal range of motion and neck supple.  Skin:    General: Skin is warm and dry.  Neurological:     Mental Status: She is alert and oriented to person, place, and time.     BP 119/64 (BP Location: Left Arm, Patient Position: Sitting, Cuff Size: Normal)   Pulse 60   Ht 4\' 10"  (1.473 m)   BMI 25.83 kg/m  Wt Readings from Last 3 Encounters:  12/12/19 123 lb 9.6 oz (56.1 kg)  11/29/19 123 lb 3.2 oz (55.9 kg)  11/28/19 123 lb (55.8 kg)    Diabetic Foot Exam - Simple   No data filed     Lab Results  Component Value Date   WBC 4.5 02/01/2020   HGB 11.3 (L) 02/01/2020   HCT 35.8 02/01/2020   PLT 200 02/01/2020   GLUCOSE 85 02/01/2020   CHOL 163 02/01/2020   TRIG 128 02/01/2020   HDL 62 02/01/2020   LDLCALC 79 02/01/2020   ALT 16 02/01/2020   AST 28 02/01/2020   NA 139 02/01/2020   K 4.0 02/01/2020   CL 101 02/01/2020   CREATININE 1.34 (H)  02/01/2020   BUN 28 (H) 02/01/2020   CO2 26 02/01/2020   TSH 1.24 02/01/2020   INR 1.2 02/16/2019   HGBA1C 6.0 09/18/2019    Lab Results  Component Value Date   TSH 1.24 02/01/2020   Lab Results  Component Value Date   WBC 4.5 02/01/2020   HGB 11.3 (L) 02/01/2020   HCT 35.8 02/01/2020   MCV 91.3 02/01/2020   PLT 200 02/01/2020   Lab Results  Component Value Date   NA 139 02/01/2020   K 4.0 02/01/2020   CO2 26 02/01/2020   GLUCOSE 85 02/01/2020   BUN 28 (H) 02/01/2020   CREATININE 1.34 (H)  02/01/2020   BILITOT 0.5 02/01/2020   ALKPHOS 65 11/16/2019   AST 28 02/01/2020   ALT 16 02/01/2020   PROT 6.9 02/01/2020   ALBUMIN 4.4 11/16/2019   CALCIUM 8.9 02/01/2020   ANIONGAP 11 02/24/2019   GFR 42.63 (L) 11/16/2019   Lab Results  Component Value Date   CHOL 163 02/01/2020   Lab Results  Component Value Date   HDL 62 02/01/2020   Lab Results  Component Value Date   LDLCALC 79 02/01/2020   Lab Results  Component Value Date   TRIG 128 02/01/2020   Lab Results  Component Value Date   CHOLHDL 2.6 02/01/2020   Lab Results  Component Value Date   HGBA1C 6.0 09/18/2019       Assessment & Plan:   Problem List Items Addressed This Visit    Essential hypertension - Primary (Chronic)    Well controlled, no changes to meds. Encouraged heart healthy diet such as the DASH diet and exercise as tolerated.       Relevant Orders   TSH (Completed)   Hyperlipidemia   Relevant Orders   Lipid panel (Completed)   Hypothyroid    On Levothyroxine, continue to monitor      Diarrhea    Cultures negative and patient referred to gastroenterology for further evaluation      Relevant Orders   Ova and parasite examination   Ambulatory referral to Gastroenterology   CBC with Differential/Platelet (Completed)   Comprehensive metabolic panel (Completed)   Clostridium difficile culture-fecal   Stool Culture   Stool, WBC/Lactoferrin (Completed)   Vitamin D deficiency   Hyperglycemia    hgba1c acceptable, minimize simple carbs. Increase exercise as tolerated.       Arthritis    Pain notes some tingling and numbness in her right 3 middle fingers when she lies down at night. Possibly TOS vs arthritic changes in neck. Encouraged moist heat and gentle stretching as tolerated. May try NSAIDs and prescription meds as directed and report if symptoms worsen or seek immediate care. Consider referral if worsens.       Dysuria   Relevant Orders   Urinalysis (Completed)   Urine  Culture (Completed)      I am having Van Clines. [place] maintain her omega-3 acid ethyl esters, atorvastatin, ondansetron, albuterol, ASPIRIN 81 PO, pramipexole, metoprolol succinate, levothyroxine, primidone, fluticasone, citalopram, potassium chloride, lisinopril, sucralfate, omeprazole, dicyclomine, furosemide, and hyoscyamine.  No orders of the defined types were placed in this encounter.    Penni Homans, MD

## 2020-02-04 NOTE — Assessment & Plan Note (Signed)
Well controlled, no changes to meds. Encouraged heart healthy diet such as the DASH diet and exercise as tolerated.  °

## 2020-02-04 NOTE — Assessment & Plan Note (Signed)
Cultures negative and patient referred to gastroenterology for further evaluation

## 2020-02-04 NOTE — Assessment & Plan Note (Deleted)
Pain notes some tingling and numbness in her right 3 middle fingers when she lies down at night. Possibly TOS vs arthritic changes in neck. Encouraged moist heat and gentle stretching as tolerated. May try NSAIDs and prescription meds as directed and report if symptoms worsen or seek immediate care. Consider referral if worsens.

## 2020-02-05 LAB — SALMONELLA/SHIGELLA CULT, CAMPY EIA AND SHIGA TOXIN RFL ECOLI
MICRO NUMBER: 10935705
MICRO NUMBER:: 10935706
MICRO NUMBER:: 10935707
Result:: NOT DETECTED
SHIGA RESULT:: NOT DETECTED
SPECIMEN QUALITY: ADEQUATE
SPECIMEN QUALITY:: ADEQUATE
SPECIMEN QUALITY:: ADEQUATE

## 2020-02-08 LAB — FECAL LACTOFERRIN, QUANT
Fecal Lactoferrin: NEGATIVE
MICRO NUMBER:: 10935552
SPECIMEN QUALITY:: ADEQUATE

## 2020-02-08 LAB — CLOSTRIDIUM DIFFICILE CULTURE-FECAL

## 2020-02-13 ENCOUNTER — Other Ambulatory Visit: Payer: Self-pay | Admitting: Family Medicine

## 2020-02-13 ENCOUNTER — Other Ambulatory Visit: Payer: Self-pay | Admitting: Cardiology

## 2020-02-19 ENCOUNTER — Telehealth: Payer: Medicare HMO | Admitting: Nurse Practitioner

## 2020-02-19 DIAGNOSIS — N3000 Acute cystitis without hematuria: Secondary | ICD-10-CM

## 2020-02-19 MED ORDER — CEPHALEXIN 500 MG PO CAPS: 500.0000 mg | ORAL_CAPSULE | Freq: Two times a day (BID) | ORAL | 0 refills | Status: DC

## 2020-02-19 NOTE — Progress Notes (Signed)
We are sorry that you are not feeling well.  Here is how we plan to help!  Based on what you shared with me it looks like you most likely have a simple urinary tract infection.  A UTI (Urinary Tract Infection) is a bacterial infection of the bladder.  Most cases of urinary tract infections are simple to treat but a key part of your care is to encourage you to drink plenty of fluids and watch your symptoms carefully.  I have prescribed Keflex 500 mg twice a day for 7 days. ( they currently are  recommending that we not use Cipro due to some recent side effects).  Your symptoms should gradually improve. Call us if the burning in your urine worsens, you develop worsening fever, back pain or pelvic pain or if your symptoms do not resolve after completing the antibiotic.  Urinary tract infections can be prevented by drinking plenty of water to keep your body hydrated.  Also be sure when you wipe, wipe from front to back and don't hold it in!  If possible, empty your bladder every 4 hours.  Your e-visit answers were reviewed by a board certified advanced clinical practitioner to complete your personal care plan.  Depending on the condition, your plan could have included both over the counter or prescription medications.  If there is a problem please reply  once you have received a response from your provider.  Your safety is important to [name].  If you have drug allergies check your prescription carefully.    You can use MyChart to ask questions about todays visit, request a non-urgent call back, or ask for a work or school excuse for 24 hours related to this e-Visit. If it has been greater than 24 hours you will need to follow up with your provider, or enter a new e-Visit to address those concerns.   You will get an e-mail in the next two days asking about your experience.  I hope that your e-visit has been valuable and will speed your recovery. Thank you for using e-visits.  5-10 minutes spent  reviewing and documenting in chart.

## 2020-02-21 DIAGNOSIS — R768 Other specified abnormal immunological findings in serum: Secondary | ICD-10-CM | POA: Diagnosis not present

## 2020-02-21 DIAGNOSIS — Z6823 Body mass index (BMI) 23.0-23.9, adult: Secondary | ICD-10-CM | POA: Diagnosis not present

## 2020-02-21 DIAGNOSIS — M0579 Rheumatoid arthritis with rheumatoid factor of multiple sites without organ or systems involvement: Secondary | ICD-10-CM | POA: Diagnosis not present

## 2020-02-21 DIAGNOSIS — M7989 Other specified soft tissue disorders: Secondary | ICD-10-CM | POA: Diagnosis not present

## 2020-02-25 ENCOUNTER — Ambulatory Visit: Payer: Medicare HMO | Admitting: Cardiology

## 2020-02-26 ENCOUNTER — Other Ambulatory Visit: Payer: Self-pay

## 2020-02-26 ENCOUNTER — Ambulatory Visit (INDEPENDENT_AMBULATORY_CARE_PROVIDER_SITE_OTHER): Payer: Medicare HMO | Admitting: Cardiology

## 2020-02-26 VITALS — BP 102/82 | HR 70 | Ht <= 58 in | Wt 122.8 lb

## 2020-02-26 DIAGNOSIS — I5032 Chronic diastolic (congestive) heart failure: Secondary | ICD-10-CM

## 2020-02-26 DIAGNOSIS — I10 Essential (primary) hypertension: Secondary | ICD-10-CM | POA: Diagnosis not present

## 2020-02-26 DIAGNOSIS — I2510 Atherosclerotic heart disease of native coronary artery without angina pectoris: Secondary | ICD-10-CM | POA: Diagnosis not present

## 2020-02-26 DIAGNOSIS — I471 Supraventricular tachycardia: Secondary | ICD-10-CM

## 2020-02-26 NOTE — Progress Notes (Signed)
Cardiology Office Note:    Date:  02/26/2020   ID:  Julie Wilkerson, DOB [birth year], MRN [id number]  PCP:  [name], MD  Cardiologist:  Berniece Salines, DO  Electrophysiologist:  None   Referring MD: [name], MD   " I am experiencing palpitations"  History of Present Illness:    Julie Wilkerson is a 79 y.o. female with a hx of coronary artery disease, hypertension, hyperlipidemia, chronic diastolic heart failure presents today for follow-up visit.  Last saw the patient in July 2021 at our [place] office at that time she  requested to see me for preprocedure clearance for gastroenterology.  Interim she was able to get her endoscopy done which showed 5 cm hiatal hernia and she has been following with GI since.  The patient tells me she has been experiencing intermittent palpitations.  She notes that it is mostly at night when she lies down to go to bed.  She states that time he wakes her up because of the abrupt onset of fast heart rate which last for a few minutes prior to resolution.  No chest pain or shortness of breath.  He does mention that when she lie on her left side she experiences significant numbness of her left arm.  But whenever she rolls over it feels better.  Past Medical History:  Diagnosis Date  . Anemia   . Anginal pain ([place])   . Anxiety   . Arthritis   . Asthma   . Blood transfusion without reported diagnosis   . Breast cancer ([place])    2002, left, encapsulated, microcalcifications. lumpectomy, radtiation x 30  . Breast cancer in female [facility])   . Change in mole 06/08/2016  . CHF (congestive heart failure) ([place])   . Colitis   . Complication of anesthesia    VERY SENSITIVE TO ANESTHESIA   . Decreased visual acuity 01/16/2017  . Dyspnea 11/04/2016   with asthma exacerbation only  . Dysuria 11/04/2016  . Gluten intolerance   . History of chicken pox   . History of Helicobacter pylori infection 08/01/2015  . Hyperlipidemia   . Hypertension   .  Hypothyroidism   . IBS (irritable bowel syndrome)   . Left leg pain 03/01/2017  . Low back pain 08/10/2015  . 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 Neck pain 03/01/2017  . Osteoporosis   . Personal history of radiation therapy   . Pneumonia   . PONV (postoperative nausea and vomiting)   . RLQ discomfort 03/01/2017  . Stomach cramps   . Urinary tract infection 11/04/2016    Past Surgical History:  Procedure Laterality Date  . ABDOMINAL HYSTERECTOMY     partial  . APPENDECTOMY    . BREAST LUMPECTOMY Left 2002  . CHOLECYSTECTOMY  Age 73 or 59  . COLONOSCOPY  2017  . EYE SURGERY Left    cataract  . LEFT HEART CATH AND CORONARY ANGIOGRAPHY N/A 02/09/2019   Procedure: LEFT HEART CATH AND CORONARY ANGIOGRAPHY;  Surgeon: Nelva Bush, MD;  Location: [place] [facility];  Service: Cardiovascular;  Laterality: N/A;  . TONSILLECTOMY    . TOTAL HIP ARTHROPLASTY Left 02/13/2019   Procedure: TOTAL HIP ARTHROPLASTY ANTERIOR APPROACH;  Surgeon: Paralee Cancel, MD;  Location: [facility];  Service: Orthopedics;  Laterality: Left;  70 mins    Current Medications: Current Meds  Medication Sig  . albuterol (VENTOLIN HFA) 108 (90 Base) MCG/ACT inhaler Inhale 2 puffs into the lungs every 6 (six)  hours as needed for wheezing or shortness of breath.  . ASPIRIN 81 PO 81 mg 2 (two) times daily.  Marland Kitchen atorvastatin (LIPITOR) 20 MG tablet Take 1 tablet by mouth once daily  . citalopram (CELEXA) 20 MG tablet TAKE 1 TABLET BY MOUTH AT BEDTIME  . dicyclomine (BENTYL) 10 MG capsule TAKE 1 CAPSULE BY MOUTH EVERY 8 HOURS AS NEEDED FOR SPASMS  . fluticasone (FLONASE) 50 MCG/ACT nasal spray Place 2 sprays into both nostrils daily as needed for allergies or rhinitis.  . furosemide (LASIX) 40 MG tablet Take 1 tablet by mouth once daily  . hyoscyamine (LEVSIN SL) 0.125 MG SL tablet DISSOLVE 1 TABLET UNDER THE TONGUE EVERY 6 HOURS AS NEEDED  . levothyroxine (SYNTHROID) 50 MCG tablet Take 1 tablet by mouth  once daily  . lisinopril (ZESTRIL) 5 MG tablet Take 1 tablet by mouth twice daily  . metoprolol succinate (TOPROL-XL) 25 MG 24 hr tablet TAKE 1 TABLET BY MOUTH AT BEDTIME  . omega-3 acid ethyl esters (LOVAZA) 1 g capsule Take 2 g by mouth daily.  Marland Kitchen omeprazole (PRILOSEC) 40 MG capsule Take 1 capsule (40 mg total) by mouth in the morning and at bedtime. Take 1 tablet by mouth once to twice daily  . ondansetron (ZOFRAN) 4 MG tablet Take 1 tablet (4 mg total) by mouth every 8 (eight) hours as needed for nausea or vomiting.  . potassium chloride (KLOR-CON) 10 MEQ tablet Take 1 tablet by mouth once daily  . pramipexole (MIRAPEX) 0.125 MG tablet Take 1 tablet (0.125 mg total) by mouth 2 (two) times daily.  . primidone (MYSOLINE) 50 MG tablet TAKE 1 TABLET BY MOUTH AT BEDTIME  . sucralfate (CARAFATE) 1 g tablet Take 1 tablet (1 g total) by mouth 2 (two) times daily.     Allergies:   Patient has no known allergies.   Social History   Socioeconomic History  . Marital status: Single    Spouse name: Not on file  . Number of children: 3  . Years of education: 58  . Highest education level: Master's degree (e.g., MA, MS, MEng, MEd, MSW, MBA)  Occupational History  . Occupation: retired    Comment: Pharmacist, hospital, kindergarten  Tobacco Use  . Smoking status: Never Smoker  . Smokeless tobacco: Never Used  Vaping Use  . Vaping Use: Never used  Substance and Sexual Activity  . Alcohol use: Never    Alcohol/week: 0.0 standard drinks  . Drug use: Never  . Sexual activity: Not Currently    Birth control/protection: None    Comment: lives alone, avoids daiiry and gluten. 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 Not on file  Social History Narrative  . Not on file   Social Determinants of Health   Financial Resource Strain:   . Difficulty of Paying Living Expenses: Not on file  Food Insecurity:   . Worried About Charity fundraiser in the Last Year: Not on file  . Ran Out of Food in the  Last Year: Not on file  Transportation Needs:   . Lack of Transportation (Medical): Not on file  . Lack of Transportation (Non-Medical): Not on file  Physical Activity:   . Days of Exercise per Week: Not on file  . Minutes of Exercise per Session: Not on file  Stress:   . Feeling of Stress : Not on file  Social Connections:   . Frequency of Communication with Friends and Family: Not on file  . Frequency of  Social Gatherings with Friends and Family: Not on file  . Attends Religious Services: Not on file  . Active Member of Clubs or Organizations: Not on file  . Attends Archivist Meetings: Not on file  . Marital Status: Not on file     Family History: The patient's family history includes Arthritis in her daughter; Colitis in her mother; Colon cancer in her paternal grandmother; Heart attack in her brother, brother, and brother; Heart disease in her daughter; Hyperlipidemia in her brother, brother, brother, and sister; Hypertension in her father; Irritable bowel syndrome in her mother; Leukemia in her daughter; Stomach cancer (age of onset: 78) in her paternal grandmother. There is no history of Esophageal cancer.  ROS:   Review of Systems  Constitution: Negative for decreased appetite, fever and weight gain.  HENT: Negative for congestion, ear discharge, hoarse voice and sore throat.   Eyes: Negative for discharge, redness, vision loss in right eye and visual halos.  Cardiovascular: Reports increasing palpitations.  Negative for chest pain, dyspnea on exertion, leg swelling, orthopnea. Respiratory: Negative for cough, hemoptysis, shortness of breath and snoring.   Endocrine: Negative for heat intolerance and polyphagia.  Hematologic/Lymphatic: Negative for bleeding problem. Does not bruise/bleed easily.  Skin: Negative for flushing, nail changes, rash and suspicious lesions.  Musculoskeletal: Negative for arthritis, joint pain, muscle cramps, myalgias, neck pain and  stiffness.  Gastrointestinal: Negative for abdominal pain, bowel incontinence, diarrhea and excessive appetite.  Genitourinary: Negative for decreased libido, genital sores and incomplete emptying.  Neurological: Negative for brief paralysis, focal weakness, headaches and loss of balance.  Psychiatric/Behavioral: Negative for altered mental status, depression and suicidal ideas.  Allergic/Immunologic: Negative for HIV exposure and persistent infections.    EKGs/Labs/Other Studies Reviewed:    The following studies were reviewed today:   EKG:  The ekg ordered today demonstrates sinus rhythm, heart rate 62 bpm with nonspecific ST changes compared to prior EKG no significant change.  The patient wore the monitor for 5 day starting 04/03/2019. Indication: Palpitations The minimum heart rate was 45 bpm, the maximum heart rate was 144 bpm, and average heart rate was 59 bpm. Predominant underlying rhythm was Sinus Rhythm.  9 Supraventricular Tachycardia runs occurred, the run with the fastest interval lasting 5 beats with a max rate of 144 bpm, the longest lasting 13 beats with an avg rate of 109 bpm. Premature atrial complexes were rare (<1.0%). Premature ventricular complexes were rare (<1.0%). No patient triggered events were noted.  No Ventricular tachycardia, No pauses, No AV block and no atrial fibrillation present.  Conclusion: This study is remarkable for supraventricular tachycardia which is likely atrial tachycardia with variable block.  Echo done February 16, 2019 IMPRESSIONS  1. Left ventricular ejection fraction, by visual estimation, is 60 to 65%. The left ventricle has normal function. There is no left ventricular hypertrophy.  2. Global right ventricle has normal systolic function.The right ventricular size is normal. No increase in right ventricular wall thickness.  3. Limited echo.   Recent Labs: 02/26/2019: Pro B Natriuretic peptide (BNP) 62.0 04/03/2019: Magnesium  1.8 02/01/2020: ALT 16; BUN 28; Creat 1.34; Hemoglobin 11.3; Platelets 200; Potassium 4.0; Sodium 139; TSH 1.24  Recent Lipid Panel    Component Value Date/Time   CHOL 163 02/01/2020 1020   TRIG 128 02/01/2020 1020   HDL 62 02/01/2020 1020   CHOLHDL 2.6 02/01/2020 1020   VLDL 18.4 09/18/2019 1155   LDLCALC 79 02/01/2020 1020    Physical Exam:  VS:  BP 102/82 (BP Location: Left Arm, Patient Position: Sitting, Cuff Size: Normal)   Pulse 70   Ht 4\' 10"  (1.473 m)   Wt 122 lb 12.8 oz (55.7 kg)   SpO2 95%   BMI 25.67 kg/m     Wt Readings from Last 3 Encounters:  02/26/20 122 lb 12.8 oz (55.7 kg)  12/12/19 123 lb 9.6 oz (56.1 kg)  11/29/19 123 lb 3.2 oz (55.9 kg)     GEN: Well nourished, well developed in no acute distress HEENT: Normal NECK: No JVD; No carotid bruits LYMPHATICS: No lymphadenopathy CARDIAC: S1S2 noted,RRR, no murmurs, rubs, gallops RESPIRATORY:  Clear to auscultation without rales, wheezing or rhonchi  ABDOMEN: Soft, non-tender, non-distended, +bowel sounds, no guarding. EXTREMITIES: No edema, No cyanosis, no clubbing MUSCULOSKELETAL:  No deformity  SKIN: Warm and dry NEUROLOGIC:  Alert and oriented x 3, non-focal PSYCHIATRIC:  Normal affect, good insight  ASSESSMENT:    1. Paroxysmal atrial tachycardia ([place])   2. Chronic diastolic congestive heart failure ([place])   3. Coronary artery disease involving native coronary artery of native heart without angina pectoris   4. Essential hypertension    PLAN:     She has a history of paroxysmal atrial tachycardia she is on beta-blocker recently she has been experiencing more palpitations.  We will like to do is have the patient take an extra dose of 12.5 of her Toprol-XL as needed.  Educated patient how to take this medication.  When she is experiencing palpitations.  She will also update me in the next 2 weeks to see if this is working.  My plan is to ideally increase her beta-blocker Toprol XL to 37.5 mg  daily.  I do believe the pain when she laid on her left side is neuropathic pain.  There is no signs of angina at this time.  We will keep the patient on aspirin 81 mg daily along with atorvastatin 20 mg daily.  She is fully compensated today.  The patient is in agreement with the above plan. The patient left the office in stable condition.  The patient will follow up in 1 month or sooner if needed.   Medication Adjustments/Labs and Tests Ordered: Current medicines are reviewed at length with the patient today.  Concerns regarding medicines are outlined above.  No orders of the defined types were placed in this encounter.  No orders of the defined types were placed in this encounter.   Patient Instructions  Medication Instructions:  Your physician recommends that you continue on your current medications as directed. Please refer to the Current Medication list given to you today.  Please take an extra 0.5 tablet of your TOPROL XL if you are having palpitations. Call us or send [name] a message on MyChart in two weeks to let us know how this is working for you.  *If you need a refill on your cardiac medications before your next appointment, please call your pharmacy*   Lab Work: None If you have labs (blood work) drawn today and your tests are completely normal, you will receive your results only by: Marland Kitchen MyChart Message (if you have MyChart) OR . A paper copy in the mail If you have any lab test that is abnormal or we need to change your treatment, we will call you to review the results.   Testing/Procedures: None   Follow-Up: At [facility], you and your health needs are our priority.  As part of our continuing mission to provide you  with exceptional heart care, we have created designated Provider Care Teams.  These Care Teams include your primary Cardiologist (physician) and Advanced Practice Providers (APPs -  Physician Assistants and Nurse Practitioners) who all work together to  provide you with the care you need, when you need it.  We recommend signing up for the patient portal called "MyChart".  Sign up information is provided on this After Visit Summary.  MyChart is used to connect with patients for Virtual Visits (Telemedicine).  Patients are able to view lab/test results, encounter notes, upcoming appointments, etc.  Non-urgent messages can be sent to your provider as well.   To learn more about what you can do with MyChart, go to [email or web address].    Your next appointment:   1 month(s)  The format for your next appointment:   In Person  Provider:   Berniece Salines, DO   Other Instructions      Adopting a Healthy Lifestyle.  Know what a healthy weight is for you (roughly BMI <25) and aim to maintain this   Aim for 7+ servings of fruits and vegetables daily   65-80+ fluid ounces of water or unsweet tea for healthy kidneys   Limit to max 1 drink of alcohol per day; avoid smoking/tobacco   Limit animal fats in diet for cholesterol and heart health - choose grass fed whenever available   Avoid highly processed foods, and foods high in saturated/trans fats   Aim for low stress - take time to unwind and care for your mental health   Aim for 150 min of moderate intensity exercise weekly for heart health, and weights twice weekly for bone health   Aim for 7-9 hours of sleep daily   When it comes to diets, agreement about the perfect plan isnt easy to find, even among the experts. Experts at the [place] developed an idea known as the Healthy Eating Plate. Just imagine a plate divided into logical, healthy portions.   The emphasis is on diet quality:   Load up on vegetables and fruits - one-half of your plate: Aim for color and variety, and remember that potatoes dont count.   Go for whole grains - one-quarter of your plate: Whole wheat, barley, wheat berries, quinoa, oats, brown rice, and foods made with them. If you  want pasta, go with whole wheat pasta.   Protein power - one-quarter of your plate: Fish, chicken, beans, and nuts are all healthy, versatile protein sources. Limit red meat.   The diet, however, does go beyond the plate, offering a few other suggestions.   Use healthy plant oils, such as olive, canola, soy, corn, sunflower and peanut. Check the labels, and avoid partially hydrogenated oil, which have unhealthy trans fats.   If youre thirsty, drink water. Coffee and tea are good in moderation, but skip sugary drinks and limit milk and dairy products to one or two daily servings.   The type of carbohydrate in the diet is more important than the amount. Some sources of carbohydrates, such as vegetables, fruits, whole grains, and beans-are healthier than others.   Finally, stay active  Signed, Berniece Salines, DO  02/26/2020 9:07 AM    [place]

## 2020-02-26 NOTE — Patient Instructions (Signed)
Medication Instructions:  Your physician recommends that you continue on your current medications as directed. Please refer to the Current Medication list given to you today.  Please take an extra 0.5 tablet of your TOPROL XL if you are having palpitations. Call us or send [name] a message on MyChart in two weeks to let us know how this is working for you.  *If you need a refill on your cardiac medications before your next appointment, please call your pharmacy*   Lab Work: None If you have labs (blood work) drawn today and your tests are completely normal, you will receive your results only by: Marland Kitchen MyChart Message (if you have MyChart) OR . A paper copy in the mail If you have any lab test that is abnormal or we need to change your treatment, we will call you to review the results.   Testing/Procedures: None   Follow-Up: At [facility], you and your health needs are our priority.  As part of our continuing mission to provide you with exceptional heart care, we have created designated Provider Care Teams.  These Care Teams include your primary Cardiologist (physician) and Advanced Practice Providers (APPs -  Physician Assistants and Nurse Practitioners) who all work together to provide you with the care you need, when you need it.  We recommend signing up for the patient portal called "MyChart".  Sign up information is provided on this After Visit Summary.  MyChart is used to connect with patients for Virtual Visits (Telemedicine).  Patients are able to view lab/test results, encounter notes, upcoming appointments, etc.  Non-urgent messages can be sent to your provider as well.   To learn more about what you can do with MyChart, go to [email or web address].    Your next appointment:   1 month(s)  The format for your next appointment:   In Person  Provider:   Berniece Salines, DO   Other Instructions

## 2020-02-26 NOTE — Addendum Note (Signed)
Addended by: Jerl Santos R on: 02/26/2020 09:14 AM   Modules accepted: Orders

## 2020-02-28 ENCOUNTER — Other Ambulatory Visit: Payer: Self-pay | Admitting: Family Medicine

## 2020-03-04 ENCOUNTER — Encounter (HOSPITAL_BASED_OUTPATIENT_CLINIC_OR_DEPARTMENT_OTHER): Payer: Self-pay | Admitting: Emergency Medicine

## 2020-03-04 ENCOUNTER — Emergency Department (HOSPITAL_BASED_OUTPATIENT_CLINIC_OR_DEPARTMENT_OTHER): Payer: Medicare HMO

## 2020-03-04 ENCOUNTER — Emergency Department (HOSPITAL_BASED_OUTPATIENT_CLINIC_OR_DEPARTMENT_OTHER)
Admission: EM | Admit: 2020-03-04 | Discharge: 2020-03-05 | Disposition: A | Discharge status: Home / Self Care | Payer: Medicare HMO | Attending: Emergency Medicine | Admitting: Emergency Medicine

## 2020-03-04 ENCOUNTER — Other Ambulatory Visit: Payer: Self-pay

## 2020-03-04 DIAGNOSIS — J45909 Unspecified asthma, uncomplicated: Secondary | ICD-10-CM | POA: Insufficient documentation

## 2020-03-04 DIAGNOSIS — I2510 Atherosclerotic heart disease of native coronary artery without angina pectoris: Secondary | ICD-10-CM | POA: Insufficient documentation

## 2020-03-04 DIAGNOSIS — Q600 Renal agenesis, unilateral: Secondary | ICD-10-CM | POA: Diagnosis not present

## 2020-03-04 DIAGNOSIS — R072 Precordial pain: Secondary | ICD-10-CM | POA: Diagnosis not present

## 2020-03-04 DIAGNOSIS — E039 Hypothyroidism, unspecified: Secondary | ICD-10-CM | POA: Diagnosis not present

## 2020-03-04 DIAGNOSIS — R519 Headache, unspecified: Secondary | ICD-10-CM | POA: Diagnosis not present

## 2020-03-04 DIAGNOSIS — K449 Diaphragmatic hernia without obstruction or gangrene: Secondary | ICD-10-CM | POA: Diagnosis not present

## 2020-03-04 DIAGNOSIS — Z96642 Presence of left artificial hip joint: Secondary | ICD-10-CM | POA: Insufficient documentation

## 2020-03-04 DIAGNOSIS — I5031 Acute diastolic (congestive) heart failure: Secondary | ICD-10-CM | POA: Insufficient documentation

## 2020-03-04 DIAGNOSIS — Z79899 Other long term (current) drug therapy: Secondary | ICD-10-CM | POA: Diagnosis not present

## 2020-03-04 DIAGNOSIS — Z853 Personal history of malignant neoplasm of breast: Secondary | ICD-10-CM | POA: Insufficient documentation

## 2020-03-04 DIAGNOSIS — K5730 Diverticulosis of large intestine without perforation or abscess without bleeding: Secondary | ICD-10-CM | POA: Diagnosis not present

## 2020-03-04 DIAGNOSIS — J984 Other disorders of lung: Secondary | ICD-10-CM | POA: Diagnosis not present

## 2020-03-04 DIAGNOSIS — I110 Hypertensive heart disease with heart failure: Secondary | ICD-10-CM | POA: Insufficient documentation

## 2020-03-04 DIAGNOSIS — R079 Chest pain, unspecified: Secondary | ICD-10-CM | POA: Diagnosis present

## 2020-03-04 DIAGNOSIS — I200 Unstable angina: Secondary | ICD-10-CM | POA: Diagnosis present

## 2020-03-04 DIAGNOSIS — Z20822 Contact with and (suspected) exposure to covid-19: Secondary | ICD-10-CM | POA: Diagnosis not present

## 2020-03-04 DIAGNOSIS — Z9049 Acquired absence of other specified parts of digestive tract: Secondary | ICD-10-CM | POA: Diagnosis not present

## 2020-03-04 HISTORY — DX: Chest pain, unspecified: R07.9

## 2020-03-04 LAB — CBC WITH DIFFERENTIAL/PLATELET
Abs Immature Granulocytes: 0.01 10*3/uL (ref 0.00–0.07)
Basophils Absolute: 0 10*3/uL (ref 0.0–0.1)
Basophils Relative: 0 %
Eosinophils Absolute: 0 10*3/uL (ref 0.0–0.5)
Eosinophils Relative: 0 %
HCT: 32.5 % — ABNORMAL LOW (ref 36.0–46.0)
Hemoglobin: 10.6 g/dL — ABNORMAL LOW (ref 12.0–15.0)
Immature Granulocytes: 0 %
Lymphocytes Relative: 35 %
Lymphs Abs: 1.4 10*3/uL (ref 0.7–4.0)
MCH: 29 pg (ref 26.0–34.0)
MCHC: 32.6 g/dL (ref 30.0–36.0)
MCV: 89 fL (ref 80.0–100.0)
Monocytes Absolute: 0.7 10*3/uL (ref 0.1–1.0)
Monocytes Relative: 17 %
Neutro Abs: 1.9 10*3/uL (ref 1.7–7.7)
Neutrophils Relative %: 48 %
Platelets: 187 10*3/uL (ref 150–400)
RBC: 3.65 MIL/uL — ABNORMAL LOW (ref 3.87–5.11)
RDW: 14.3 % (ref 11.5–15.5)
WBC: 3.9 10*3/uL — ABNORMAL LOW (ref 4.0–10.5)
nRBC: 0 % (ref 0.0–0.2)

## 2020-03-04 LAB — RESPIRATORY PANEL BY RT PCR (FLU A&B, COVID)
Influenza A by PCR: NEGATIVE
Influenza B by PCR: NEGATIVE
SARS Coronavirus 2 by RT PCR: NEGATIVE

## 2020-03-04 LAB — COMPREHENSIVE METABOLIC PANEL
ALT: 21 U/L (ref 0–44)
AST: 32 U/L (ref 15–41)
Albumin: 4 g/dL (ref 3.5–5.0)
Alkaline Phosphatase: 65 U/L (ref 38–126)
Anion gap: 11 (ref 5–15)
BUN: 21 mg/dL (ref 8–23)
CO2: 25 mmol/L (ref 22–32)
Calcium: 8.7 mg/dL — ABNORMAL LOW (ref 8.9–10.3)
Chloride: 103 mmol/L (ref 98–111)
Creatinine, Ser: 1.07 mg/dL — ABNORMAL HIGH (ref 0.44–1.00)
GFR, Estimated: 50 mL/min — ABNORMAL LOW (ref >60)
Glucose, Bld: 99 mg/dL (ref 70–99)
Potassium: 3.6 mmol/L (ref 3.5–5.1)
Sodium: 139 mmol/L (ref 135–145)
Total Bilirubin: 0.6 mg/dL (ref 0.3–1.2)
Total Protein: 7.1 g/dL (ref 6.5–8.1)

## 2020-03-04 LAB — TROPONIN I (HIGH SENSITIVITY)
Troponin I (High Sensitivity): 36 ng/L — ABNORMAL HIGH (ref <18)
Troponin I (High Sensitivity): 37 ng/L — ABNORMAL HIGH (ref <18)

## 2020-03-04 LAB — LIPASE, BLOOD: Lipase: 35 U/L (ref 11–51)

## 2020-03-04 LAB — D-DIMER, QUANTITATIVE: D-Dimer, Quant: 1.66 ug/mL-FEU — ABNORMAL HIGH (ref 0.00–0.50)

## 2020-03-04 MED ORDER — METOCLOPRAMIDE HCL 5 MG/ML IJ SOLN
5.0000 mg | Freq: Once | INTRAMUSCULAR | Status: AC
  Administered 2020-03-04: 5 mg via INTRAVENOUS
  Filled 2020-03-04: qty 2

## 2020-03-04 MED ORDER — IOHEXOL 350 MG/ML SOLN
100.0000 mL | Freq: Once | INTRAVENOUS | Status: AC | PRN
  Administered 2020-03-04: 100 mL via INTRAVENOUS

## 2020-03-04 MED ORDER — ASPIRIN 81 MG PO CHEW
324.0000 mg | CHEWABLE_TABLET | Freq: Once | ORAL | Status: AC
  Administered 2020-03-04: 324 mg via ORAL
  Filled 2020-03-04: qty 4

## 2020-03-04 NOTE — ED Notes (Signed)
Patient transported to CT via transport 

## 2020-03-04 NOTE — ED Provider Notes (Signed)
[facility] EMERGENCY DEPARTMENT Provider Note   CSN: [id number] Arrival date & time: 03/04/20  0957     History Chief Complaint  Patient presents with  . Chest Pain    Julie Wilkerson is a 79 y.o. female.  The history is provided by the patient, medical records and a relative.   Julie Wilkerson is a 79 y.o. female who presents to the Emergency Department complaining of HA and chest pain.  She awoke at 2am with severe, pounding HA with associated chest pain described as pounding.  She had associated sob and pain in her left arm/hand.  HA is located on the left side.  She has a hx/o hiatal hernia and has experienced progressive upper abdominal pain, nausea and poor appetite for the last month.  Abdominal pain radiates to her back. She has experienced episodes of chest pain over the last few days. Pain is resolved on ED presentation.  Denies fevers, vomiting, diarrhea.  Feels numb in left arm and left leg (chronic in nature).      Past Medical History:  Diagnosis Date  . Anemia   . Anginal pain ([place])   . Anxiety   . Arthritis   . Asthma   . Blood transfusion without reported diagnosis   . Breast cancer ([place])    2002, left, encapsulated, microcalcifications. lumpectomy, radtiation x 30  . Breast cancer in female [facility])   . Change in mole 06/08/2016  . CHF (congestive heart failure) ([place])   . Colitis   . Complication of anesthesia    VERY SENSITIVE TO ANESTHESIA   . Decreased visual acuity 01/16/2017  . Dyspnea 11/04/2016   with asthma exacerbation only  . Dysuria 11/04/2016  . Gluten intolerance   . History of chicken pox   . History of Helicobacter pylori infection 08/01/2015  . Hyperlipidemia   . Hypertension   . Hypothyroidism   . IBS (irritable bowel syndrome)   . Left leg pain 03/01/2017  . Low back pain 08/10/2015  . 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 Neck pain 03/01/2017  . Osteoporosis   . Personal history of radiation therapy    . Pneumonia   . PONV (postoperative nausea and vomiting)   . RLQ discomfort 03/01/2017  . Stomach cramps   . Urinary tract infection 11/04/2016    Patient Active Problem List   Diagnosis Date Noted  . Chest pain 03/04/2020  . Dysuria 02/04/2020  . Arthritis 09/19/2019  . History of rectal bleeding 06/01/2019  . LUQ pain 06/01/2019  . Chronic diastolic congestive heart failure ([place]) 05/28/2019  . Paroxysmal atrial tachycardia ([place]) 05/28/2019  . Coronary artery disease involving native coronary artery of native heart without angina pectoris 05/28/2019  . Urinary incontinence 05/07/2019  . RLS (restless legs syndrome) 05/07/2019  . Hx of LASIK 04/24/2019  . 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 PVD (posterior vitreous detachment), right 04/24/2019  . 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 Acute on chronic diastolic CHF (congestive heart failure) ([place]) 02/23/2019  . CHF (congestive heart failure) ([place]) 02/23/2019  . Sepsis due to pneumonia ([place]) 02/16/2019  . Acute diastolic CHF (congestive heart failure) ([place]) 02/16/2019  . Hyponatremia 02/16/2019  . Overweight (BMI 25.0-29.9) 02/14/2019  . 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 Accelerating angina ([place]) 02/09/2019  . Atypical chest pain 02/04/2019  . Bursitis of left hip 09/04/2018  . Pain in joint of left shoulder 08/11/2018  . Degeneration of lumbar intervertebral disc  08/01/2018  . Pelvic pain 07/02/2018  . Left shoulder pain 05/07/2018  . Left hand pain 05/07/2018  . Amblyopia of eye, left 04/19/2018  . Cortical age-related cataract of right eye 04/18/2018  . Nuclear sclerotic cataract of right eye 04/18/2018  . Pseudophakia of left eye 04/18/2018  . Change in bowel habits 02/22/2018  . Nausea and vomiting 02/22/2018  . Poor appetite 02/22/2018  . Anemia 01/17/2018  . Constipation 01/17/2018  . Urinary frequency 12/22/2017  . Right hip pain 10/23/2017  . Elevated sed rate 10/23/2017  .  Acute pansinusitis 04/26/2017  . Bilateral carotid bruits 03/28/2017  . Left hip pain 03/01/2017  . Neck pain 03/01/2017  . RLQ discomfort 03/01/2017  . Decreased visual acuity 01/16/2017  . 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 Hyperglycemia 11/07/2016  . 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 Abdominal pain 11/07/2016  . Urinary tract infection 11/04/2016  . Change in mole 06/08/2016  . 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 Vitamin D deficiency 04/29/2016  . Tremor 02/15/2016  . Diarrhea 11/16/2015  . Low back pain 08/10/2015  . History of Helicobacter pylori infection 08/01/2015  . Cough variant asthma  vs UACS from ACEi    . Breast cancer in female [facility])   . Hyperlipidemia   . Essential hypertension   . Osteoporosis   . Hypothyroid   . IBS (irritable bowel syndrome)   . Gluten intolerance   . History of chicken pox     Past Surgical History:  Procedure Laterality Date  . ABDOMINAL HYSTERECTOMY     partial  . APPENDECTOMY    . BREAST LUMPECTOMY Left 2002  . CHOLECYSTECTOMY  Age 76 or 89  . COLONOSCOPY  2017  . EYE SURGERY Left    cataract  . LEFT HEART CATH AND CORONARY ANGIOGRAPHY N/A 02/09/2019   Procedure: LEFT HEART CATH AND CORONARY ANGIOGRAPHY;  Surgeon: Nelva Bush, MD;  Location: [facility];  Service: Cardiovascular;  Laterality: N/A;  . TONSILLECTOMY    . TOTAL HIP ARTHROPLASTY Left 02/13/2019   Procedure: TOTAL HIP ARTHROPLASTY ANTERIOR APPROACH;  Surgeon: Paralee Cancel, MD;  Location: [facility];  Service: Orthopedics;  Laterality: Left;  70 mins     OB History    Gravida  4   Para  3   Term  3   Preterm      AB  1   Living  3     SAB  1   TAB      Ectopic      Multiple      Live Births  3           Family History  Problem Relation Age of Onset  . Colitis Mother   . Irritable bowel syndrome Mother   . Hypertension Father   . Hyperlipidemia Brother   . Heart attack Brother   . Stomach cancer Paternal Grandmother 70    . Colon cancer Paternal Grandmother   . Hyperlipidemia Brother   . Heart attack Brother   . Hyperlipidemia Brother   . Heart attack Brother   . Hyperlipidemia Sister   . Leukemia Daughter   . Arthritis Daughter   . Heart disease Daughter        ASD vs VSD  . Esophageal cancer Neg Hx     Social History   Tobacco Use  . Smoking status: Never Smoker  . Smokeless tobacco: Never Used  Vaping Use  . Vaping Use: Never used  Substance Use Topics  .  Alcohol use: Never    Alcohol/week: 0.0 standard drinks  . Drug use: Never    Home Medications Prior to Admission medications   Medication Sig Start Date End Date Taking? Authorizing Provider  albuterol (VENTOLIN HFA) 108 (90 Base) MCG/ACT inhaler Inhale 2 puffs into the lungs every 6 (six) hours as needed for wheezing or shortness of breath. 02/26/19   [name], MD  ASPIRIN 81 PO 81 mg 2 (two) times daily.    [provider]  atorvastatin (LIPITOR) 20 MG tablet Take 1 tablet by mouth once daily 02/14/20   Tobb, Kardie, DO  citalopram (CELEXA) 20 MG tablet TAKE 1 TABLET BY MOUTH AT BEDTIME 10/08/19   [name], MD  dicyclomine (BENTYL) 10 MG capsule TAKE 1 CAPSULE BY MOUTH EVERY 8 HOURS AS NEEDED FOR SPASMS 11/21/19   Armbruster, Carlota Raspberry, MD  fluticasone (FLONASE) 50 MCG/ACT nasal spray Place 2 sprays into both nostrils daily as needed for allergies or rhinitis. 09/18/19   [name], MD  furosemide (LASIX) 40 MG tablet Take 1 tablet by mouth once daily 12/19/19   [name], MD  hyoscyamine (LEVSIN SL) 0.125 MG SL tablet DISSOLVE 1 TABLET UNDER THE TONGUE EVERY 6 HOURS AS NEEDED 01/24/20   [name], MD  levothyroxine (EUTHYROX) 50 MCG tablet Take 1 tablet (50 mcg total) by mouth daily. 02/28/20   [name], MD  lisinopril (ZESTRIL) 5 MG tablet Take 1 tablet by mouth twice daily 10/31/19   [name], MD  metoprolol succinate (TOPROL-XL) 25 MG 24 hr tablet TAKE 1 TABLET BY MOUTH AT BEDTIME 02/13/20    [name], MD  omega-3 acid ethyl esters (LOVAZA) 1 g capsule Take 2 g by mouth daily.    [provider]  omeprazole (PRILOSEC) 40 MG capsule Take 1 capsule (40 mg total) by mouth in the morning and at bedtime. Take 1 tablet by mouth once to twice daily 11/20/19   Armbruster, Carlota Raspberry, MD  ondansetron (ZOFRAN) 4 MG tablet Take 1 tablet (4 mg total) by mouth every 8 (eight) hours as needed for nausea or vomiting. 02/26/19   [name], MD  potassium chloride (KLOR-CON) 10 MEQ tablet Take 1 tablet by mouth once daily 10/23/19   [name], MD  pramipexole (MIRAPEX) 0.125 MG tablet Take 1 tablet (0.125 mg total) by mouth 2 (two) times daily. 05/03/19   [name], MD  primidone (MYSOLINE) 50 MG tablet TAKE 1 TABLET BY MOUTH AT BEDTIME 08/20/19   Tat, Eustace Quail, DO  sucralfate (CARAFATE) 1 g tablet Take 1 tablet (1 g total) by mouth 2 (two) times daily. 11/16/19   Saguier, Percell Miller, PA-C    Allergies    Patient has no known allergies.  Review of Systems   Review of Systems  All other systems reviewed and are negative.   Physical Exam Updated Vital Signs BP (!) 130/51   Pulse 60   Temp 98.7 F (37.1 C) (Oral)   Resp 19   Ht 4\' 10"  (1.473 m)   Wt 54.4 kg   SpO2 97%   BMI 25.08 kg/m   Physical Exam Vitals and nursing note reviewed.  Constitutional:      Appearance: She is well-developed.  HENT:     Head: Normocephalic and atraumatic.  Cardiovascular:     Rate and Rhythm: Normal rate and regular rhythm.     Heart sounds: No murmur heard.   Pulmonary:  Effort: Pulmonary effort is normal. No respiratory distress.     Breath sounds: Normal breath sounds.  Abdominal:     Palpations: Abdomen is soft.     Tenderness: There is no guarding or rebound.     Comments: Mild upper abdominal tenderness  Musculoskeletal:        General: No tenderness.  Skin:    General: Skin is warm and dry.  Neurological:     Mental Status: She is alert and oriented to person,  place, and time.     Comments: Mild and is scoria with left pupil slightly larger than the right. Pupils are reactive bilaterally. EOM I. No asymmetry of facial movements. Generalized weakness. Tremor to bilateral upper extremities. Sensation to light touch intact in all four extremities.  Psychiatric:        Behavior: Behavior normal.     ED Results / Procedures / Treatments   Labs (all labs ordered are listed, but only abnormal results are displayed) Labs Reviewed  COMPREHENSIVE METABOLIC PANEL - Abnormal; Notable for the following components:      Result Value   Creatinine, Ser 1.07 (*)    Calcium 8.7 (*)    GFR, Estimated 50 (*)    All other components within normal limits  CBC WITH DIFFERENTIAL/PLATELET - Abnormal; Notable for the following components:   WBC 3.9 (*)    RBC 3.65 (*)    Hemoglobin 10.6 (*)    HCT 32.5 (*)    All other components within normal limits  D-DIMER, QUANTITATIVE (NOT AT [facility]) - Abnormal; Notable for the following components:   D-Dimer, Quant 1.66 (*)    All other components within normal limits  TROPONIN I (HIGH SENSITIVITY) - Abnormal; Notable for the following components:   Troponin I (High Sensitivity) 36 (*)    All other components within normal limits  TROPONIN I (HIGH SENSITIVITY) - Abnormal; Notable for the following components:   Troponin I (High Sensitivity) 37 (*)    All other components within normal limits  RESPIRATORY PANEL BY RT PCR (FLU A&B, COVID)  LIPASE, BLOOD    EKG EKG Interpretation  Date/Time:  Tuesday March 04 2020 10:18:40 EDT Ventricular Rate:  62 PR Interval:    QRS Duration: 88 QT Interval:  385 QTC Calculation: 391 R Axis:   14 Text Interpretation: Sinus rhythm Borderline repolarization abnormality Confirmed by Quintella Reichert [phone]) on 03/04/2020 10:20:55 AM   Radiology DG Chest 2 View  Result Date: 03/04/2020 CLINICAL DATA:  Chest pain EXAM: CHEST - 2 VIEW COMPARISON:  February 22, 2019 FINDINGS: There  are areas of scattered scarring in each mid and lower lung region. Lungs elsewhere are clear. No edema or airspace opacity. Heart size and pulmonary vascularity are normal. There is aortic atherosclerosis. There is thoracolumbar levoscoliosis. IMPRESSION: Scattered areas of scarring bilaterally. No edema or airspace opacity. Heart size normal. Aortic Atherosclerosis (ICD10-I70.0). Electronically Signed   By: Lowella Grip III M.D.   On: 03/04/2020 11:36   CT Head Wo Contrast  Result Date: 03/04/2020 CLINICAL DATA:  Headache and nausea EXAM: CT HEAD WITHOUT CONTRAST TECHNIQUE: Contiguous axial images were obtained from the base of the skull through the vertex without intravenous contrast. COMPARISON:  February 22, 2019 FINDINGS: Brain: There is stable age related volume loss. There is no intracranial mass, hemorrhage, extra-axial fluid collection, or midline shift. There is patchy small vessel disease in the centra semiovale bilaterally. No acute infarct is evident. Vascular: No hyperdense vessel. There is calcification in each  carotid siphon region. Skull: The bony calvarium appears intact. Sinuses/Orbits: There is patchy opacity in the sphenoid sinuses bilaterally. Visualized orbits appear symmetric bilaterally except for evidence of previous cataract removal on the left. Other: There is opacification in several inferior mastoid air cells on the left. Mastoids elsewhere are clear. IMPRESSION: Age related volume loss with stable periventricular small vessel disease. No acute infarct. No mass or hemorrhage. There are foci of arterial vascular calcification. There is patchy opacity in the sphenoid sinuses bilaterally. There is stable inferior mastoid air cell disease on the left. Electronically Signed   By: Lowella Grip III M.D.   On: 03/04/2020 11:29   CT Angio Chest/Abd/Pel for Dissection W and/or W/WO  Result Date: 03/04/2020 CLINICAL DATA:  Chest pain radiating to back, aortic dissection suspected  EXAM: CT ANGIOGRAPHY CHEST, ABDOMEN AND PELVIS TECHNIQUE: Non-contrast CT of the chest was initially obtained. Multidetector CT imaging through the chest, abdomen and pelvis was performed using the standard protocol during bolus administration of intravenous contrast. Multiplanar reconstructed images and MIPs were obtained and reviewed to evaluate the vascular anatomy. CONTRAST:  128mL OMNIPAQUE IOHEXOL 350 MG/ML SOLN COMPARISON:  CT abdomen pelvis, 06/06/2019 FINDINGS: CTA CHEST FINDINGS Cardiovascular: Preferential opacification of the thoracic aorta. Normal contour and caliber of the thoracic aorta. No evidence of aneurysm, dissection, or other acute aortic pathology. Scattered atherosclerosis. Normal heart size. No pericardial effusion. Mediastinum/Nodes: No enlarged mediastinal, hilar, or axillary lymph nodes. Small hiatal hernia. Thyroid gland, trachea, and esophagus demonstrate no significant findings. Lungs/Pleura: Bandlike scarring of the lung bases. No pleural effusion or pneumothorax. Musculoskeletal: No chest wall abnormality. No acute or significant osseous findings. Review of the MIP images confirms the above findings. CTA ABDOMEN AND PELVIS FINDINGS VASCULAR Normal contour and caliber of the abdominal aorta. No evidence of aneurysm, dissection, or other acute aortic pathology. Standard branching pattern of the abdominal aorta, with solitary bilateral renal arteries. Scattered atherosclerosis. Review of the MIP images confirms the above findings. NON-VASCULAR Hepatobiliary: No focal liver abnormality is seen. Status post cholecystectomy. Postoperative biliary dilatation. Pancreas: Unremarkable. No pancreatic ductal dilatation or surrounding inflammatory changes. Spleen: Normal in size without significant abnormality. Adrenals/Urinary Tract: Adrenal glands are unremarkable. Kidneys are normal, without renal calculi, solid lesion, or hydronephrosis. Bladder is unremarkable. Stomach/Bowel: Stomach is  within normal limits. Appendix appears normal. No evidence of bowel wall thickening, distention, or inflammatory changes. Sigmoid diverticulosis. Lymphatic: No enlarged abdominal or pelvic lymph nodes. Reproductive: Status post hysterectomy. Other: No abdominal wall hernia or abnormality. No abdominopelvic ascites. Musculoskeletal: No acute or significant osseous findings. Status post left hip total arthroplasty. Review of the MIP images confirms the above findings. IMPRESSION: 1. Normal contour and caliber of the thoracic and abdominal aorta. No evidence of aneurysm, dissection, or other acute aortic pathology. Scattered atherosclerosis. 2. No acute findings in the chest, abdomen or pelvis to explain pain. 3. Small hiatal hernia. 4. Sigmoid diverticulosis without evidence of diverticulitis. 5. Status post cholecystectomy and hysterectomy. Electronically Signed   By: Eddie Candle M.D.   On: 03/04/2020 12:52    Procedures Procedures (including critical care time)  Medications Ordered in ED Medications  metoCLOPramide (REGLAN) injection 5 mg (5 mg Intravenous Given 03/04/20 1140)  iohexol (OMNIPAQUE) 350 MG/ML injection 100 mL (100 mLs Intravenous Contrast Given 03/04/20 1209)  aspirin chewable tablet 324 mg (324 mg Oral Given 03/04/20 1450)    ED Course  I have reviewed the triage vital signs and the nursing notes.  Pertinent labs & imaging results  that were available during my care of the patient were reviewed by me and considered in my medical decision making (see chart for details).    MDM Rules/Calculators/A&P                         Patient here for evaluation of headache, chest pain. She is non-toxic appearing on evaluation with no focal neurologic deficits. Presentation is not consistent with subarachnoid hemorrhage, CVA. D dimer was obtained, which is elevated. CTA dissection protocol was obtained, that was negative for dissection or PE. Troponins are mildly elevated. EKG is unchanged  from priors. Given patient's history of coronary artery disease and recurrent episodes of chest pain recommend observation for further cardiac evaluation. Patient is in agreement with treatment plan. Discussed with Dr. Oval Linsey, with cardiology who will see the patient and consult over at [facility].  Final Clinical Impression(s) / ED Diagnoses Final diagnoses:  Precordial pain    Rx / DC Orders ED Discharge Orders    None       Quintella Reichert, MD 03/04/20 1525

## 2020-03-04 NOTE — ED Notes (Signed)
Pt on monitor 

## 2020-03-04 NOTE — ED Notes (Signed)
Attempted IV in right AC, unable to obtain.

## 2020-03-04 NOTE — ED Triage Notes (Signed)
Pt arrives pov with family with c/o mid sternal CP, radiation to back and left arm, nausea and weakness since yesterday.

## 2020-03-04 NOTE — ED Notes (Signed)
Pt in CT, unable to administer medication at this time.

## 2020-03-05 ENCOUNTER — Telehealth: Payer: Self-pay | Admitting: Cardiology

## 2020-03-05 ENCOUNTER — Other Ambulatory Visit: Payer: Self-pay | Admitting: Family Medicine

## 2020-03-05 DIAGNOSIS — I10 Essential (primary) hypertension: Secondary | ICD-10-CM | POA: Insufficient documentation

## 2020-03-05 DIAGNOSIS — J189 Pneumonia, unspecified organism: Secondary | ICD-10-CM | POA: Insufficient documentation

## 2020-03-05 DIAGNOSIS — C50919 Malignant neoplasm of unspecified site of unspecified female breast: Secondary | ICD-10-CM | POA: Insufficient documentation

## 2020-03-05 DIAGNOSIS — R109 Unspecified abdominal pain: Secondary | ICD-10-CM | POA: Insufficient documentation

## 2020-03-05 DIAGNOSIS — F419 Anxiety disorder, unspecified: Secondary | ICD-10-CM | POA: Insufficient documentation

## 2020-03-05 DIAGNOSIS — Z9889 Other specified postprocedural states: Secondary | ICD-10-CM | POA: Insufficient documentation

## 2020-03-05 DIAGNOSIS — Z923 Personal history of irradiation: Secondary | ICD-10-CM | POA: Insufficient documentation

## 2020-03-05 DIAGNOSIS — Z5189 Encounter for other specified aftercare: Secondary | ICD-10-CM | POA: Insufficient documentation

## 2020-03-05 DIAGNOSIS — T8859XA Other complications of anesthesia, initial encounter: Secondary | ICD-10-CM | POA: Insufficient documentation

## 2020-03-05 DIAGNOSIS — K529 Noninfective gastroenteritis and colitis, unspecified: Secondary | ICD-10-CM | POA: Insufficient documentation

## 2020-03-05 DIAGNOSIS — J45909 Unspecified asthma, uncomplicated: Secondary | ICD-10-CM | POA: Insufficient documentation

## 2020-03-05 DIAGNOSIS — R112 Nausea with vomiting, unspecified: Secondary | ICD-10-CM | POA: Insufficient documentation

## 2020-03-05 DIAGNOSIS — M79641 Pain in right hand: Secondary | ICD-10-CM | POA: Insufficient documentation

## 2020-03-05 DIAGNOSIS — I209 Angina pectoris, unspecified: Secondary | ICD-10-CM | POA: Insufficient documentation

## 2020-03-05 LAB — TROPONIN I (HIGH SENSITIVITY): Troponin I (High Sensitivity): 25 ng/L — ABNORMAL HIGH (ref <18)

## 2020-03-05 MED ORDER — METOCLOPRAMIDE HCL 10 MG PO TABS: 10.0000 mg | ORAL_TABLET | Freq: Four times a day (QID) | ORAL | 0 refills | Status: DC

## 2020-03-05 NOTE — ED Notes (Signed)
Patient slept throughout the night, up to restroom once during the night, no c/o pain

## 2020-03-05 NOTE — Telephone Encounter (Signed)
If she is willing to come to [place] I can see her earlier

## 2020-03-05 NOTE — Telephone Encounter (Signed)
Patient was in the ED yesterday, she states that they only discharge with the condition that she see Dr. Harriet Masson ASAP.  I advised her I didn't not have anything prior to November 1st at [facility].

## 2020-03-05 NOTE — Discharge Instructions (Addendum)
You were evaluated in the Emergency Department and after careful evaluation, we did not find any emergent condition requiring admission or further testing in the hospital.  You were observed in the emergency department given your symptoms of chest pain and your mildly elevated troponin tests.  After further discussions with cardiology and further testing, we have deemed you safe for discharge with close cardiology follow-up.  Please return to the Emergency Department if you experience any worsening of your condition.  Thank you for allowing [name] to be a part of your care.

## 2020-03-05 NOTE — ED Provider Notes (Signed)
  Provider Note MRN:  [id number]  Arrival date & time: 03/05/20    ED Course and Medical Decision Making  Assumed care from default provider at shift change.  Patient awaiting admission for greater than 24 hours.  I was called by the cardiology team to reassess and determine if patient could possibly be discharged.  Reassessment patient is feeling much better, she is currently endorsing mild epigastric discomfort, which she was also experiencing with her chest pain earlier.  She has had this chest/epigastric pain for several months.  She is wondering if it is related to her hiatal hernia.  After further discussion with cardiology, we obtained a third troponin which was downtrending.  Given the suspicion that her pain is GI related rather than cardiac, she is appropriate for discharge with close outpatient cardiology follow-up.  Plan discussed with both patient and patient's daughter and they are agreeable with the plan.  Procedures  Final Clinical Impressions(s) / ED Diagnoses     ICD-10-CM   1. Precordial pain  R07.2     ED Discharge Orders         Ordered    metoCLOPramide (REGLAN) 10 MG tablet  Every 6 hours        03/05/20 1149            Discharge Instructions     You were evaluated in the Emergency Department and after careful evaluation, we did not find any emergent condition requiring admission or further testing in the hospital.  You were observed in the emergency department given your symptoms of chest pain and your mildly elevated troponin tests.  After further discussions with cardiology and further testing, we have deemed you safe for discharge with close cardiology follow-up.  Please return to the Emergency Department if you experience any worsening of your condition.  Thank you for allowing [name] to be a part of your care.     Barth Kirks. [place]onia Small, [place] [email or web address]    Maudie Flakes, MD 03/05/20  [phone]

## 2020-03-05 NOTE — ED Notes (Signed)
ED Provider at bedside. 

## 2020-03-05 NOTE — Telephone Encounter (Signed)
Spoke to the patient just now and let her know that she could be seen sooner if she was willing to come to [facility]. She states that she will and she was scheduled with Dr. Harriet Masson on Friday.    Encouraged patient to call back with any questions or concerns.

## 2020-03-06 ENCOUNTER — Telehealth: Payer: Self-pay | Admitting: Family Medicine

## 2020-03-06 NOTE — Progress Notes (Signed)
  Chronic Care Management   Note  03/06/2020 Name: Julie Wilkerson MRN: [id number] DOB: [birth year]  Julie Wilkerson is a 79 y.o. year old female who is a primary care patient of [name], MD. I reached out to [name] by phone today in response to a referral sent by Julie Wilkerson's PCP, [name], MD.   Julie Wilkerson was given information about Chronic Care Management services today including:  1. CCM service includes personalized support from designated clinical staff supervised by her physician, including individualized plan of care and coordination with other care providers 2. 24/7 contact phone numbers for assistance for urgent and routine care needs. 3. Service will only be billed when office clinical staff spend 20 minutes or more in a month to coordinate care. 4. Only one practitioner may furnish and bill the service in a calendar month. 5. The patient may stop CCM services at any time (effective at the end of the month) by phone call to the office staff.   Patient agreed to services and verbal consent obtained.   Follow up plan:   Carley Perdue [employer] Scheduler

## 2020-03-10 ENCOUNTER — Other Ambulatory Visit: Payer: Self-pay

## 2020-03-10 ENCOUNTER — Ambulatory Visit: Payer: Medicare HMO | Admitting: Cardiology

## 2020-03-10 ENCOUNTER — Encounter: Payer: Self-pay | Admitting: Cardiology

## 2020-03-10 VITALS — BP 115/71 | HR 54 | Ht <= 58 in | Wt 121.2 lb

## 2020-03-10 DIAGNOSIS — I5032 Chronic diastolic (congestive) heart failure: Secondary | ICD-10-CM | POA: Diagnosis not present

## 2020-03-10 DIAGNOSIS — I2510 Atherosclerotic heart disease of native coronary artery without angina pectoris: Secondary | ICD-10-CM | POA: Diagnosis not present

## 2020-03-10 DIAGNOSIS — I471 Supraventricular tachycardia: Secondary | ICD-10-CM | POA: Diagnosis not present

## 2020-03-10 DIAGNOSIS — G4734 Idiopathic sleep related nonobstructive alveolar hypoventilation: Secondary | ICD-10-CM | POA: Diagnosis not present

## 2020-03-10 NOTE — Progress Notes (Signed)
Cardiology Office Note:    Date:  03/10/2020   ID:  Julie Wilkerson, DOB 1940-07-15, MRN [id number]  PCP:  [name], MD  Cardiologist:  Berniece Salines, DO  Electrophysiologist:  None   Referring MD: [name], MD   " I was the emergency room.  I was having some palpitations and chest pain" History of Present Illness:    Julie Wilkerson is a 79 y.o. female with a hx of coronary artery disease, hypertension, hyperlipidemia, chronic diastolic heart failure presents today for follow-up visit.  I saw the patient on February 26, 2020 at that time she reported intermittent palpitation and I recommended that the patient take extra dose of beta-blocker as needed if her heart rate was greater than 588 and her systolic blood pressure was greater than 110 to take an extra 12.5 mg of her metoprolol succinate.  She also reported pain to me that did sound more neuropathic and not appear to be anginal.  I continue the patient on her aspirin 81 mg daily and atorvastatin 20 mg daily.  She did go for emergency room visit on March 04, 2020 at that time she reported similar pain troponins were negative she was asked to follow-up.  She tells me at that time she also did have some palpitations but did not try the extra dose of metoprolol.  During her emergency department visit she tells me when she was sleeping she became hypoxic and it was concerning therefore she was asked to follow-up and discuss sleep study.  No other complaints at this time.  Past Medical History:  Diagnosis Date  . Abdominal pain 11/07/2016  . Accelerating angina ([place]) 02/09/2019  . Acute diastolic CHF (congestive heart failure) ([place]) 02/16/2019  . Acute on chronic diastolic CHF (congestive heart failure) ([place]) 02/23/2019  . Acute pansinusitis 04/26/2017  . Amblyopia of eye, left 04/19/2018  . Anemia   . Anginal pain ([place])   . Anxiety   . Arthritis   . Asthma   . Atypical chest pain 02/04/2019  . Bilateral carotid bruits  03/28/2017  . Blood transfusion without reported diagnosis   . Breast cancer ([place])    2002, left, encapsulated, microcalcifications. lumpectomy, radtiation x 30  . Breast cancer in female [facility] [place])   . Bursitis of left hip 09/04/2018  . Change in bowel habits 02/22/2018  . Change in mole 06/08/2016  . Chest pain 03/04/2020  . CHF (congestive heart failure) ([place])   . 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 Chronic diastolic congestive heart failure ([place]) 05/28/2019  . Colitis   . Complication of anesthesia    VERY SENSITIVE TO ANESTHESIA   . Constipation 01/17/2018  . Coronary artery disease involving native coronary artery of native heart without angina pectoris 05/28/2019  . Cortical age-related cataract of right eye 04/18/2018  . Cough variant asthma  vs UACS from ACEi     Spirometry 12/02/2016  FEV1 1.26 (76%)  Ratio 78 with min curvature p last saba > 6 h prior  - trial off acei 12/02/2016  - 12/02/2016  After extensive coaching HFA effectiveness =    75% with qvar autohaler > rechallenge with 80 2bid    . Decreased visual acuity 01/16/2017  . Degeneration of lumbar intervertebral disc 08/01/2018  . Diarrhea 11/16/2015   D/o Diverticuli, polyps, celiac disease.    Marland Kitchen Dyspnea 11/04/2016   with asthma exacerbation only  . Dysuria 11/04/2016  . Elevated sed rate 10/23/2017  . Essential hypertension  Trial off acei 12/02/2016 due to cough/ pseudoasthma  . Gluten intolerance   . History of chicken pox   . History of Helicobacter pylori infection 08/01/2015  . History of rectal bleeding 06/01/2019  . Hx of LASIK 04/24/2019  . Hyperglycemia 11/07/2016  . Hyperlipidemia   . Hypertension   . Hyponatremia 02/16/2019  . Hypothyroidism   . IBS (irritable bowel syndrome)   . 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 Left hand pain 05/07/2018  . Left hip pain 03/01/2017  . Left leg pain 03/01/2017  . Left shoulder pain 05/07/2018  . Low back pain 08/10/2015  . LUQ pain 06/01/2019  . 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 Neck pain 03/01/2017  . Nuclear sclerotic cataract of right eye 04/18/2018  . Osteoporosis   . Overweight (BMI 25.0-29.9) 02/14/2019  . Pain in joint of left shoulder 08/11/2018  . Paroxysmal atrial tachycardia ([place]) 05/28/2019  . Pelvic pain 07/02/2018  . Personal history of radiation therapy   . Pneumonia   . PONV (postoperative nausea and vomiting)   . Poor appetite 02/22/2018  . Pseudophakia of left eye 04/18/2018  . PVD (posterior vitreous detachment), right 04/24/2019  . 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 Right hip pain 10/23/2017  . RLQ discomfort 03/01/2017  . RLS (restless legs syndrome) 05/07/2019  . 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 Sepsis due to pneumonia ([place]) 02/16/2019  . Stomach cramps   . Tremor 02/15/2016  . Urinary frequency 12/22/2017  . Urinary incontinence 05/07/2019  . Urinary tract infection 11/04/2016  . Vitamin D deficiency 04/29/2016    Past Surgical History:  Procedure Laterality Date  . ABDOMINAL HYSTERECTOMY     partial  . APPENDECTOMY    . BREAST LUMPECTOMY Left 2002  . CHOLECYSTECTOMY  Age 43 or 44  . COLONOSCOPY  2017  . EYE SURGERY Left    cataract  . LEFT HEART CATH AND CORONARY ANGIOGRAPHY N/A 02/09/2019   Procedure: LEFT HEART CATH AND CORONARY ANGIOGRAPHY;  Surgeon: Nelva Bush, MD;  Location: [place] [facility];  Service: Cardiovascular;  Laterality: N/A;  . TONSILLECTOMY    . TOTAL HIP ARTHROPLASTY Left 02/13/2019   Procedure: TOTAL HIP ARTHROPLASTY ANTERIOR APPROACH;  Surgeon: Paralee Cancel, MD;  Location: [facility];  Service: Orthopedics;  Laterality: Left;  70 mins    Current Medications: Current Meds  Medication Sig  . albuterol (VENTOLIN HFA) 108 (90 Base) MCG/ACT inhaler Inhale 2 puffs into the lungs every 6 (six) hours as needed for wheezing or shortness of breath.  . ASPIRIN 81 PO 81 mg daily.   Marland Kitchen atorvastatin (LIPITOR) 20 MG tablet Take 1 tablet by mouth once  daily  . citalopram (CELEXA) 20 MG tablet TAKE 1 TABLET BY MOUTH AT BEDTIME  . dicyclomine (BENTYL) 10 MG capsule TAKE 1 CAPSULE BY MOUTH EVERY 8 HOURS AS NEEDED FOR SPASMS  . fluticasone (FLONASE) 50 MCG/ACT nasal spray Place 2 sprays into both nostrils daily as needed for allergies or rhinitis.  . furosemide (LASIX) 40 MG tablet Take 1 tablet by mouth once daily  . hyoscyamine (LEVSIN SL) 0.125 MG SL tablet DISSOLVE 1 TABLET UNDER THE TONGUE EVERY 6 HOURS AS NEEDED  . levothyroxine (EUTHYROX) 50 MCG tablet Take 1 tablet (50 mcg total) by mouth daily.  Marland Kitchen lisinopril (ZESTRIL) 5 MG tablet Take 1 tablet by mouth twice daily (Patient taking differently: Take 5 mg by mouth daily. )  . metoCLOPramide (REGLAN) 10  MG tablet Take 1 tablet (10 mg total) by mouth every 6 (six) hours.  . metoprolol succinate (TOPROL-XL) 25 MG 24 hr tablet TAKE 1 TABLET BY MOUTH AT BEDTIME  . omega-3 acid ethyl esters (LOVAZA) 1 g capsule Take 2 g by mouth daily.  Marland Kitchen omeprazole (PRILOSEC) 40 MG capsule Take 1 capsule (40 mg total) by mouth in the morning and at bedtime. Take 1 tablet by mouth once to twice daily  . ondansetron (ZOFRAN) 4 MG tablet Take 1 tablet (4 mg total) by mouth every 8 (eight) hours as needed for nausea or vomiting.  . potassium chloride (KLOR-CON) 10 MEQ tablet Take 1 tablet by mouth once daily  . pramipexole (MIRAPEX) 0.125 MG tablet Take 1 tablet (0.125 mg total) by mouth 2 (two) times daily.  . primidone (MYSOLINE) 50 MG tablet TAKE 1 TABLET BY MOUTH AT BEDTIME  . sucralfate (CARAFATE) 1 g tablet Take 1 tablet (1 g total) by mouth 2 (two) times daily.     Allergies:   Patient has no known allergies.   Social History   Socioeconomic History  . Marital status: Single    Spouse name: Not on file  . Number of children: 3  . Years of education: 20  . Highest education level: Master's degree (e.g., MA, MS, MEng, MEd, MSW, MBA)  Occupational History  . Occupation: retired    Comment: Pharmacist, hospital,  kindergarten  Tobacco Use  . Smoking status: Never Smoker  . Smokeless tobacco: Never Used  Vaping Use  . Vaping Use: Never used  Substance and Sexual Activity  . Alcohol use: Never    Alcohol/week: 0.0 standard drinks  . Drug use: Never  . Sexual activity: Not Currently    Birth control/protection: None    Comment: lives alone, avoids daiiry and gluten. 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 Not on file  Social History Narrative  . Not on file   Social Determinants of Health   Financial Resource Strain:   . Difficulty of Paying Living Expenses: Not on file  Food Insecurity:   . Worried About Charity fundraiser in the Last Year: Not on file  . Ran Out of Food in the Last Year: Not on file  Transportation Needs:   . Lack of Transportation (Medical): Not on file  . Lack of Transportation (Non-Medical): Not on file  Physical Activity:   . Days of Exercise per Week: Not on file  . Minutes of Exercise per Session: Not on file  Stress:   . Feeling of Stress : Not on file  Social Connections:   . Frequency of Communication with Friends and Family: Not on file  . Frequency of Social Gatherings with Friends and Family: Not on file  . Attends Religious Services: Not on file  . Active Member of Clubs or Organizations: Not on file  . Attends Archivist Meetings: Not on file  . Marital Status: Not on file     Family History: The patient's family history includes Arthritis in her daughter; Colitis in her mother; Colon cancer in her paternal grandmother; Heart attack in her brother, brother, and brother; Heart disease in her daughter; Hyperlipidemia in her brother, brother, brother, and sister; Hypertension in her father; Irritable bowel syndrome in her mother; Leukemia in her daughter; Stomach cancer (age of onset: 51) in her paternal grandmother. There is no history of Esophageal cancer.  ROS:   Review of Systems  Constitution: Negative for decreased appetite,  fever  and weight gain.  HENT: Negative for congestion, ear discharge, hoarse voice and sore throat.   Eyes: Negative for discharge, redness, vision loss in right eye and visual halos.  Cardiovascular: Negative for chest pain, dyspnea on exertion, leg swelling, orthopnea and palpitations.  Respiratory: Negative for cough, hemoptysis, shortness of breath and snoring.   Endocrine: Negative for heat intolerance and polyphagia.  Hematologic/Lymphatic: Negative for bleeding problem. Does not bruise/bleed easily.  Skin: Negative for flushing, nail changes, rash and suspicious lesions.  Musculoskeletal: Negative for arthritis, joint pain, muscle cramps, myalgias, neck pain and stiffness.  Gastrointestinal: Negative for abdominal pain, bowel incontinence, diarrhea and excessive appetite.  Genitourinary: Negative for decreased libido, genital sores and incomplete emptying.  Neurological: Negative for brief paralysis, focal weakness, headaches and loss of balance.  Psychiatric/Behavioral: Negative for altered mental status, depression and suicidal ideas.  Allergic/Immunologic: Negative for HIV exposure and persistent infections.    EKGs/Labs/Other Studies Reviewed:    The following studies were reviewed today:   EKG: None today  Recent Labs: 04/03/2019: Magnesium 1.8 02/01/2020: TSH 1.24 03/04/2020: ALT 21; BUN 21; Creatinine, Ser 1.07; Hemoglobin 10.6; Platelets 187; Potassium 3.6; Sodium 139  Recent Lipid Panel    Component Value Date/Time   CHOL 163 02/01/2020 1020   TRIG 128 02/01/2020 1020   HDL 62 02/01/2020 1020   CHOLHDL 2.6 02/01/2020 1020   VLDL 18.4 09/18/2019 1155   LDLCALC 79 02/01/2020 1020    Physical Exam:    VS:  BP 115/71   Pulse (!) 54   Ht 4\' 10"  (1.473 m)   Wt 121 lb 3.2 oz (55 kg)   SpO2 93%   BMI 25.33 kg/m     Wt Readings from Last 3 Encounters:  03/10/20 121 lb 3.2 oz (55 kg)  03/04/20 120 lb (54.4 kg)  02/26/20 122 lb 12.8 oz (55.7 kg)     GEN: Well  nourished, well developed in no acute distress HEENT: Normal NECK: No JVD; No carotid bruits LYMPHATICS: No lymphadenopathy CARDIAC: S1S2 noted,RRR, no murmurs, rubs, gallops RESPIRATORY:  Clear to auscultation without rales, wheezing or rhonchi  ABDOMEN: Soft, non-tender, non-distended, +bowel sounds, no guarding. EXTREMITIES: No edema, No cyanosis, no clubbing MUSCULOSKELETAL:  No deformity  SKIN: Warm and dry NEUROLOGIC:  Alert and oriented x 3, non-focal PSYCHIATRIC:  Normal affect, good insight  ASSESSMENT:    1. Chronic diastolic congestive heart failure (HCC)   2. Paroxysmal atrial tachycardia ([place])   3. Coronary artery disease involving native coronary artery of native heart without angina pectoris   4. Nocturnal hypoxia    PLAN:     1.  In terms of her intermittent palpitations, the best possible approach for this patient who is sinus bradycardic today is addition of beta-blocker as needed.  She is currently on metoprolol succinate 25 mg and usually is bradycardic therefore I do not want to increase her rate limiting medications.  I have educated her that if her heart rate goes greater than 120 and takes her systolic blood pressure is greater than 110 to take additional 12.5 mg of her metoprolol.  2.  She does have the report from the emergency department with her nocturnal hypoxia.  She may benefit from a sleep study to understand more.  We will schedule the patient for sleep study.  3.  No angina symptoms at this time continue the patient on her current aspirin and statin.  4. No signs of fluid overload.    The patient is in agreement  with the above plan. The patient left the office in stable condition.  The patient will follow up in 3 months or sooner if needed.    Medication Adjustments/Labs and Tests Ordered: Current medicines are reviewed at length with the patient today.  Concerns regarding medicines are outlined above.  Orders Placed This Encounter  Procedures    . Split night study   No orders of the defined types were placed in this encounter.   There are no Patient Instructions on file for this visit.   Adopting a Healthy Lifestyle.  Know what a healthy weight is for you (roughly BMI <25) and aim to maintain this   Aim for 7+ servings of fruits and vegetables daily   65-80+ fluid ounces of water or unsweet tea for healthy kidneys   Limit to max 1 drink of alcohol per day; avoid smoking/tobacco   Limit animal fats in diet for cholesterol and heart health - choose grass fed whenever available   Avoid highly processed foods, and foods high in saturated/trans fats   Aim for low stress - take time to unwind and care for your mental health   Aim for 150 min of moderate intensity exercise weekly for heart health, and weights twice weekly for bone health   Aim for 7-9 hours of sleep daily   When it comes to diets, agreement about the perfect plan isnt easy to find, even among the experts. Experts at the [employer] developed an idea known as the Healthy Eating Plate. Just imagine a plate divided into logical, healthy portions.   The emphasis is on diet quality:   Load up on vegetables and fruits - one-half of your plate: Aim for color and variety, and remember that potatoes dont count.   Go for whole grains - one-quarter of your plate: Whole wheat, barley, wheat berries, quinoa, oats, brown rice, and foods made with them. If you want pasta, go with whole wheat pasta.   Protein power - one-quarter of your plate: Fish, chicken, beans, and nuts are all healthy, versatile protein sources. Limit red meat.   The diet, however, does go beyond the plate, offering a few other suggestions.   Use healthy plant oils, such as olive, canola, soy, corn, sunflower and peanut. Check the labels, and avoid partially hydrogenated oil, which have unhealthy trans fats.   If youre thirsty, drink water. Coffee and tea are good in  moderation, but skip sugary drinks and limit milk and dairy products to one or two daily servings.   The type of carbohydrate in the diet is more important than the amount. Some sources of carbohydrates, such as vegetables, fruits, whole grains, and beans-are healthier than others.   Finally, stay active  Signed, Berniece Salines, DO  03/10/2020 2:15 PM    [facility]

## 2020-03-10 NOTE — Patient Instructions (Signed)
Medication Instructions:  Your physician recommends that you continue on your current medications as directed. Please refer to the Current Medication list given to you today.  If your heart rate is greater than 812 and your systolic (top) blood pressure is greater than 110 take 1/2 tablet of your metoprolol.  *If you need a refill on your cardiac medications before your next appointment, please call your pharmacy*   Lab Work: None If you have labs (blood work) drawn today and your tests are completely normal, you will receive your results only by: Marland Kitchen MyChart Message (if you have MyChart) OR . A paper copy in the mail If you have any lab test that is abnormal or we need to change your treatment, we will call you to review the results.   Testing/Procedures: Your physician has recommended that you have a sleep study. This test records several body functions during sleep, including: brain activity, eye movement, oxygen and carbon dioxide blood levels, heart rate and rhythm, breathing rate and rhythm, the flow of air through your mouth and nose, snoring, body muscle movements, and chest and belly movement. You will get a call to schedule this appointment.    Follow-Up: At [facility], you and your health needs are our priority.  As part of our continuing mission to provide you with exceptional heart care, we have created designated Provider Care Teams.  These Care Teams include your primary Cardiologist (physician) and Advanced Practice Providers (APPs -  Physician Assistants and Nurse Practitioners) who all work together to provide you with the care you need, when you need it.  We recommend signing up for the patient portal called "MyChart".  Sign up information is provided on this After Visit Summary.  MyChart is used to connect with patients for Virtual Visits (Telemedicine).  Patients are able to view lab/test results, encounter notes, upcoming appointments, etc.  Non-urgent messages can be  sent to your provider as well.   To learn more about what you can do with MyChart, go to [email or web address].    Your next appointment:   3 month(s)  The format for your next appointment:   In Person  Provider:   Berniece Salines, DO   Other Instructions

## 2020-03-12 ENCOUNTER — Other Ambulatory Visit: Payer: Self-pay

## 2020-03-12 DIAGNOSIS — G4734 Idiopathic sleep related nonobstructive alveolar hypoventilation: Secondary | ICD-10-CM

## 2020-03-12 NOTE — Progress Notes (Signed)
o

## 2020-03-14 ENCOUNTER — Other Ambulatory Visit: Payer: Self-pay

## 2020-03-14 DIAGNOSIS — G4734 Idiopathic sleep related nonobstructive alveolar hypoventilation: Secondary | ICD-10-CM

## 2020-03-15 ENCOUNTER — Other Ambulatory Visit: Payer: Self-pay | Admitting: Family Medicine

## 2020-03-17 ENCOUNTER — Other Ambulatory Visit: Payer: Self-pay

## 2020-03-17 ENCOUNTER — Other Ambulatory Visit: Payer: Self-pay | Admitting: Family Medicine

## 2020-03-17 MED ORDER — HYOSCYAMINE SULFATE 0.125 MG SL SUBL: SUBLINGUAL_TABLET | SUBLINGUAL | 0 refills | Status: DC

## 2020-03-19 ENCOUNTER — Telehealth: Payer: Self-pay | Admitting: *Deleted

## 2020-03-19 DIAGNOSIS — G4734 Idiopathic sleep related nonobstructive alveolar hypoventilation: Secondary | ICD-10-CM

## 2020-03-19 NOTE — Telephone Encounter (Signed)
-----   Message from Gita Kudo, RN sent at 03/10/2020  2:17 PM EDT ----- Please schedule patient for a split night sleep study per Dr. Harriet Masson.  Thanks,  Lilia Pro, RN

## 2020-03-19 NOTE — Telephone Encounter (Signed)
  03/12/20 3:17pm PA not approved, message sent to nurse to see if HST is applicable. - LCN

## 2020-03-19 NOTE — Telephone Encounter (Signed)
That will be fine. 

## 2020-03-21 ENCOUNTER — Telehealth (INDEPENDENT_AMBULATORY_CARE_PROVIDER_SITE_OTHER): Payer: Medicare HMO | Admitting: Family Medicine

## 2020-03-21 ENCOUNTER — Other Ambulatory Visit: Payer: Self-pay

## 2020-03-21 ENCOUNTER — Encounter: Payer: Self-pay | Admitting: Family Medicine

## 2020-03-21 ENCOUNTER — Telehealth: Payer: Self-pay | Admitting: *Deleted

## 2020-03-21 VITALS — BP 132/78 | Wt 118.0 lb

## 2020-03-21 DIAGNOSIS — R778 Other specified abnormalities of plasma proteins: Secondary | ICD-10-CM

## 2020-03-21 DIAGNOSIS — M542 Cervicalgia: Secondary | ICD-10-CM

## 2020-03-21 DIAGNOSIS — E782 Mixed hyperlipidemia: Secondary | ICD-10-CM

## 2020-03-21 DIAGNOSIS — I5032 Chronic diastolic (congestive) heart failure: Secondary | ICD-10-CM | POA: Diagnosis not present

## 2020-03-21 DIAGNOSIS — R202 Paresthesia of skin: Secondary | ICD-10-CM | POA: Diagnosis not present

## 2020-03-21 DIAGNOSIS — I209 Angina pectoris, unspecified: Secondary | ICD-10-CM | POA: Diagnosis not present

## 2020-03-21 DIAGNOSIS — R109 Unspecified abdominal pain: Secondary | ICD-10-CM

## 2020-03-21 DIAGNOSIS — M545 Low back pain, unspecified: Secondary | ICD-10-CM

## 2020-03-21 DIAGNOSIS — I10 Essential (primary) hypertension: Secondary | ICD-10-CM | POA: Diagnosis not present

## 2020-03-21 DIAGNOSIS — R739 Hyperglycemia, unspecified: Secondary | ICD-10-CM

## 2020-03-21 DIAGNOSIS — R1012 Left upper quadrant pain: Secondary | ICD-10-CM

## 2020-03-21 DIAGNOSIS — E559 Vitamin D deficiency, unspecified: Secondary | ICD-10-CM

## 2020-03-21 DIAGNOSIS — R32 Unspecified urinary incontinence: Secondary | ICD-10-CM | POA: Diagnosis not present

## 2020-03-21 NOTE — Telephone Encounter (Signed)
Resa Miner I, RN  P Cv Div Sleep Studies Cc: Freada Bergeron, CMA Please schedule patient for a split night sleep study per Dr. Harriet Masson.   Thanks,  Lilia Pro, RN         03/12/20 3:17pm PA not approved, message sent to nurse to see if HST is applicable. - LCN

## 2020-03-21 NOTE — Telephone Encounter (Signed)
-----   Message from Gita Kudo, RN sent at 03/10/2020  2:17 PM EDT ----- Please schedule patient for a split night sleep study per Dr. Harriet Masson.  Thanks,  Lilia Pro, RN

## 2020-03-21 NOTE — Telephone Encounter (Signed)
Patient is aware and agreeable to Home Sleep Study through [facility]. Patient is scheduled for 04/15/20 at 12:30 to pick up home sleep kit and meet with Respiratory therapist at [facility]. Patient is aware that if this appointment date and time does not work for them they should contact [facility] directly at [phone]. Patient is aware that a sleep packet will be sent from [facility] in week.  Scheduled with Leah   

## 2020-03-23 NOTE — Assessment & Plan Note (Addendum)
Encouraged heart healthy diet, increase exercise, avoid trans fats, consider a krill oil cap daily. Tolerating Atorvastatin 

## 2020-03-23 NOTE — Assessment & Plan Note (Signed)
Supplement and monitor 

## 2020-03-23 NOTE — Assessment & Plan Note (Signed)
hgba1c acceptable, minimize simple carbs. Increase exercise as tolerated.  

## 2020-03-23 NOTE — Assessment & Plan Note (Signed)
Patient was seen in ER with chest pain and her troponin was found to be elevated, she was offered admission but her troponin levels began to decrease and her symptoms improved so they sent in her home. She is referred to cardiology at [facility]. Seek care if Chest pain returns and does not resolve.

## 2020-03-23 NOTE — Assessment & Plan Note (Signed)
Her hiatal hernia has caused her increasing trouble, most notably in epigastrium and LUQ. She deos not the hyoscyamine is helpful when this occurs. She has an appointment soon with gastroenterology and labs including pancreatic enzymes are ordered today.eat small, frequent meals with lean proteins and minimal fats report worsening symptoms

## 2020-03-23 NOTE — Progress Notes (Addendum)
Patient ID: Julie Wilkerson, female   DOB: 1940/09/29, 79 y.o.   MRN: [id number] Virtual Visit via Video Note  I connected with Julie Wilkerson on 03/21/20 at 10:00 AM EDT by a video enabled telemedicine application and verified that I am speaking with the correct person using two identifiers.  Location: Patient: home, patient and provider in visit Provider: home   I discussed the limitations of evaluation and management by telemedicine and the availability of in person appointments. The patient expressed understanding and agreed to proceed. Ronda Fairly, CMA was able to get the patient set up on a video visit    Subjective:    Patient ID: Julie Wilkerson, female    DOB: November 20, 1940, 79 y.o.   MRN: [id number]  Chief Complaint  Patient presents with  . Follow-up    medication    HPI Patient is in today for follow up after an ER visit. Patient presented to there Emergency Room with chest pain and initially was found to have an elevated troponin, they planned to admit her but then her troponin began to decrease and her symptoms had resolved. They sent her home for follow up. She has not had recurrence of significant chest pain since returning home. She has continued to have LUQ pain most notably after eating and she notes she can feel to her back. She has a significant hiatal hernia and the ER sent her home with Reglan but she is not longer taking it and she is feeling some better now. No bloody or tarry stool. No vomiting but she did note some nausea when the pain was severe. Hyoscyamine did help her LUQ pain when it occurred. Denies palp/SOB/HA/congestion/fevers or GU c/o. Taking meds as prescribed  Past Medical History:  Diagnosis Date  . Abdominal pain 11/07/2016  . Accelerating angina ([place]) 02/09/2019  . Acute diastolic CHF (congestive heart failure) ([place]) 02/16/2019  . Acute on chronic diastolic CHF (congestive heart failure) ([place]) 02/23/2019  . Acute pansinusitis 04/26/2017  . Amblyopia  of eye, left 04/19/2018  . Anemia   . Anginal pain ([place])   . Anxiety   . Arthritis   . Asthma   . Atypical chest pain 02/04/2019  . Bilateral carotid bruits 03/28/2017  . Blood transfusion without reported diagnosis   . Breast cancer ([place])    2002, left, encapsulated, microcalcifications. lumpectomy, radtiation x 30  . Breast cancer in female [facility])   . Bursitis of left hip 09/04/2018  . Change in bowel habits 02/22/2018  . Change in mole 06/08/2016  . Chest pain 03/04/2020  . CHF (congestive heart failure) ([place])   . 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 Chronic diastolic congestive heart failure ([place]) 05/28/2019  . Colitis   . Complication of anesthesia    VERY SENSITIVE TO ANESTHESIA   . Constipation 01/17/2018  . Coronary artery disease involving native coronary artery of native heart without angina pectoris 05/28/2019  . Cortical age-related cataract of right eye 04/18/2018  . Cough variant asthma  vs UACS from ACEi     Spirometry 12/02/2016  FEV1 1.26 (76%)  Ratio 78 with min curvature p last saba > 6 h prior  - trial off acei 12/02/2016  - 12/02/2016  After extensive coaching HFA effectiveness =    75% with qvar autohaler > rechallenge with 80 2bid    . Decreased visual acuity 01/16/2017  . Degeneration of lumbar intervertebral disc 08/01/2018  . Diarrhea 11/16/2015   D/o Diverticuli, polyps, celiac disease.    Marland Kitchen  Dyspnea 11/04/2016   with asthma exacerbation only  . Dysuria 11/04/2016  . Elevated sed rate 10/23/2017  . Essential hypertension    Trial off acei 12/02/2016 due to cough/ pseudoasthma  . Gluten intolerance   . History of chicken pox   . History of Helicobacter pylori infection 08/01/2015  . History of rectal bleeding 06/01/2019  . Hx of LASIK 04/24/2019  . Hyperglycemia 11/07/2016  . Hyperlipidemia   . Hypertension   . Hyponatremia 02/16/2019  . Hypothyroidism   . IBS (irritable bowel syndrome)   . 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 Left hand pain  05/07/2018  . Left hip pain 03/01/2017  . Left leg pain 03/01/2017  . Left shoulder pain 05/07/2018  . Low back pain 08/10/2015  . LUQ pain 06/01/2019  . 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 Neck pain 03/01/2017  . Nuclear sclerotic cataract of right eye 04/18/2018  . Osteoporosis   . Overweight (BMI 25.0-29.9) 02/14/2019  . Pain in joint of left shoulder 08/11/2018  . Paroxysmal atrial tachycardia ([place]) 05/28/2019  . Pelvic pain 07/02/2018  . Personal history of radiation therapy   . Pneumonia   . PONV (postoperative nausea and vomiting)   . Poor appetite 02/22/2018  . Pseudophakia of left eye 04/18/2018  . PVD (posterior vitreous detachment), right 04/24/2019  . 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 Right hip pain 10/23/2017  . RLQ discomfort 03/01/2017  . RLS (restless legs syndrome) 05/07/2019  . 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 Sepsis due to pneumonia ([place]) 02/16/2019  . Stomach cramps   . Tremor 02/15/2016  . Urinary frequency 12/22/2017  . Urinary incontinence 05/07/2019  . Urinary tract infection 11/04/2016  . Vitamin D deficiency 04/29/2016    Past Surgical History:  Procedure Laterality Date  . ABDOMINAL HYSTERECTOMY     partial  . APPENDECTOMY    . BREAST LUMPECTOMY Left 2002  . CHOLECYSTECTOMY  Age 39 or 46  . COLONOSCOPY  2017  . EYE SURGERY Left    cataract  . LEFT HEART CATH AND CORONARY ANGIOGRAPHY N/A 02/09/2019   Procedure: LEFT HEART CATH AND CORONARY ANGIOGRAPHY;  Surgeon: Nelva Bush, MD;  Location: [facility];  Service: Cardiovascular;  Laterality: N/A;  . TONSILLECTOMY    . 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 Colitis Mother   . Irritable bowel syndrome Mother   . Hypertension Father   . Hyperlipidemia  Brother   . Heart attack Brother   . Stomach cancer Paternal Grandmother 42  . Colon cancer Paternal Grandmother   . Hyperlipidemia Brother   . Heart attack Brother   . Hyperlipidemia Brother   . Heart attack Brother   . Hyperlipidemia Sister   . Leukemia Daughter   . Arthritis Daughter   . Heart disease Daughter        ASD vs VSD  . Esophageal cancer Neg Hx     Social History   Socioeconomic History  . Marital status: Single    Spouse name: Not on file  . Number of children: 3  . Years of education: 66  . Highest education level: Master's degree (e.g., MA, MS, MEng, MEd, MSW, MBA)  Occupational History  . Occupation: retired  Comment: teacher, kindergarten  Tobacco Use  . Smoking status: Never Smoker  . Smokeless tobacco: Never Used  Vaping Use  . Vaping Use: Never used  Substance and Sexual Activity  . Alcohol use: Never    Alcohol/week: 0.0 standard drinks  . Drug use: Never  . Sexual activity: Not Currently    Birth control/protection: None    Comment: lives alone, avoids daiiry and gluten. 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 Not on file  Social History Narrative  . Not on file   Social Determinants of Health   Financial Resource Strain:   . Difficulty of Paying Living Expenses: Not on file  Food Insecurity:   . Worried About Charity fundraiser in the Last Year: Not on file  . Ran Out of Food in the Last Year: Not on file  Transportation Needs:   . Lack of Transportation (Medical): Not on file  . Lack of Transportation (Non-Medical): Not on file  Physical Activity:   . Days of Exercise per Week: Not on file  . Minutes of Exercise per Session: Not on file  Stress:   . Feeling of Stress : Not on file  Social Connections:   . Frequency of Communication with Friends and Family: Not on file  . Frequency of Social Gatherings with Friends and Family: Not on file  . Attends Religious Services: Not on file  . Active Member of Clubs or  Organizations: Not on file  . Attends Archivist Meetings: Not on file  . Marital Status: Not on file  Intimate Partner Violence:   . Fear of Current or Ex-Partner: Not on file  . Emotionally Abused: Not on file  . Physically Abused: Not on file  . Sexually Abused: Not on file    Outpatient Medications Prior to Visit  Medication Sig Dispense Refill  . albuterol (VENTOLIN HFA) 108 (90 Base) MCG/ACT inhaler Inhale 2 puffs into the lungs every 6 (six) hours as needed for wheezing or shortness of breath. 18 g 2  . ASPIRIN 81 PO 81 mg daily.     Marland Kitchen atorvastatin (LIPITOR) 20 MG tablet Take 1 tablet by mouth once daily 90 tablet 0  . citalopram (CELEXA) 20 MG tablet TAKE 1 TABLET BY MOUTH AT BEDTIME 90 tablet 1  . dicyclomine (BENTYL) 10 MG capsule TAKE 1 CAPSULE BY MOUTH EVERY 8 HOURS AS NEEDED FOR SPASMS 60 capsule 1  . fluticasone (FLONASE) 50 MCG/ACT nasal spray Place 2 sprays into both nostrils daily as needed for allergies or rhinitis. 16 g 5  . furosemide (LASIX) 40 MG tablet Take 1 tablet by mouth once daily 90 tablet 0  . hyoscyamine (LEVSIN SL) 0.125 MG SL tablet DISSOLVE 1 TABLET UNDER THE TONGUE EVERY 6 HOURS AS NEEDED 30 tablet 0  . levothyroxine (EUTHYROX) 50 MCG tablet Take 1 tablet (50 mcg total) by mouth daily. 90 tablet 0  . lisinopril (ZESTRIL) 5 MG tablet Take 1 tablet by mouth twice daily (Patient taking differently: Take 5 mg by mouth daily. ) 180 tablet 1  . metoprolol succinate (TOPROL-XL) 25 MG 24 hr tablet TAKE 1 TABLET BY MOUTH AT BEDTIME 90 tablet 1  . omega-3 acid ethyl esters (LOVAZA) 1 g capsule Take 2 g by mouth daily.    Marland Kitchen omeprazole (PRILOSEC) 40 MG capsule Take 1 capsule (40 mg total) by mouth in the morning and at bedtime. Take 1 tablet by mouth once to twice daily 60 capsule 5  .  ondansetron (ZOFRAN) 4 MG tablet Take 1 tablet (4 mg total) by mouth every 8 (eight) hours as needed for nausea or vomiting. 30 tablet 1  . potassium chloride (KLOR-CON) 10  MEQ tablet Take 1 tablet by mouth once daily 90 tablet 1  . pramipexole (MIRAPEX) 0.125 MG tablet Take 1 tablet (0.125 mg total) by mouth 2 (two) times daily. 60 tablet 1  . primidone (MYSOLINE) 50 MG tablet TAKE 1 TABLET BY MOUTH AT BEDTIME 90 tablet 3  . sucralfate (CARAFATE) 1 g tablet Take 1 tablet (1 g total) by mouth 2 (two) times daily. 60 tablet 0  . metoCLOPramide (REGLAN) 10 MG tablet Take 1 tablet (10 mg total) by mouth every 6 (six) hours. 30 tablet 0   No facility-administered medications prior to visit.    No Known Allergies  Review of Systems  Constitutional: Positive for malaise/fatigue. Negative for fever.  HENT: Negative for congestion.   Eyes: Negative for blurred vision.  Respiratory: Negative for shortness of breath.   Cardiovascular: Positive for chest pain. Negative for palpitations and leg swelling.  Gastrointestinal: Positive for abdominal pain and nausea. Negative for blood in stool.  Genitourinary: Negative for dysuria and frequency.  Musculoskeletal: Negative for falls.  Skin: Negative for rash.  Neurological: Negative for dizziness, loss of consciousness and headaches.  Endo/Heme/Allergies: Negative for environmental allergies.  Psychiatric/Behavioral: Negative for depression. The patient is nervous/anxious.        Objective:    Physical Exam Constitutional:      Appearance: Normal appearance. She is not ill-appearing.  HENT:     Head: Normocephalic and atraumatic.     Right Ear: External ear normal.     Left Ear: External ear normal.     Nose: Nose normal.  Pulmonary:     Effort: Pulmonary effort is normal.  Neurological:     Mental Status: She is alert and oriented to person, place, and time.  Psychiatric:        Behavior: Behavior normal.     BP 132/78   Wt 118 lb (53.5 kg)   BMI 24.66 kg/m  Wt Readings from Last 3 Encounters:  04/02/20 117 lb (53.1 kg)  03/21/20 118 lb (53.5 kg)  03/10/20 121 lb 3.2 oz (55 kg)    Diabetic Foot  Exam - Simple   No data filed     Lab Results  Component Value Date   WBC 3.4 (L) 04/02/2020   HGB 11.3 (L) 04/02/2020   HCT 35.5 (L) 04/02/2020   PLT 195 04/02/2020   GLUCOSE 118 (H) 04/02/2020   CHOL 163 02/01/2020   TRIG 128 02/01/2020   HDL 62 02/01/2020   LDLCALC 79 02/01/2020   ALT 16 04/02/2020   AST 26 04/02/2020   NA 140 04/02/2020   K 3.9 04/02/2020   CL 105 04/02/2020   CREATININE 1.20 (H) 04/02/2020   BUN 24 (H) 04/02/2020   CO2 24 04/02/2020   TSH 1.24 02/01/2020   INR 1.2 02/16/2019   HGBA1C 6.0 09/18/2019    Lab Results  Component Value Date   TSH 1.24 02/01/2020   Lab Results  Component Value Date   WBC 3.4 (L) 04/02/2020   HGB 11.3 (L) 04/02/2020   HCT 35.5 (L) 04/02/2020   MCV 92.0 04/02/2020   PLT 195 04/02/2020   Lab Results  Component Value Date   NA 140 04/02/2020   K 3.9 04/02/2020   CO2 24 04/02/2020   GLUCOSE 118 (H) 04/02/2020   BUN  24 (H) 04/02/2020   CREATININE 1.20 (H) 04/02/2020   BILITOT 0.8 04/02/2020   ALKPHOS 62 04/02/2020   AST 26 04/02/2020   ALT 16 04/02/2020   PROT 6.9 04/02/2020   ALBUMIN 3.7 04/02/2020   CALCIUM 9.2 04/02/2020   ANIONGAP 11 04/02/2020   GFR 42.63 (L) 11/16/2019   Lab Results  Component Value Date   CHOL 163 02/01/2020   Lab Results  Component Value Date   HDL 62 02/01/2020   Lab Results  Component Value Date   LDLCALC 79 02/01/2020   Lab Results  Component Value Date   TRIG 128 02/01/2020   Lab Results  Component Value Date   CHOLHDL 2.6 02/01/2020   Lab Results  Component Value Date   HGBA1C 6.0 09/18/2019       Assessment & Plan:   Problem List Items Addressed This Visit    Essential hypertension (Chronic)   Relevant Orders   Ambulatory referral to Cardiology   Comprehensive metabolic panel (Completed)   CBC w/Diff (Completed)   Hyperlipidemia    Encouraged heart healthy diet, increase exercise, avoid trans fats, consider a krill oil cap daily. Tolerating  Atorvastatin      Low back pain   Vitamin D deficiency    Supplement and monitor      Hyperglycemia    hgba1c acceptable, minimize simple carbs. Increase exercise as tolerated.      Abdominal pain    Her hiatal hernia has caused her increasing trouble, most notably in epigastrium and LUQ. She deos not the hyoscyamine is helpful when this occurs. She has an appointment soon with gastroenterology and labs including pancreatic enzymes are ordered today.eat small, frequent meals with lean proteins and minimal fats report worsening symptoms      Neck pain on left side    Worsening and with radicular symptoms of pain and weakness into left arm. Xray shows: Four view radiograph cervical spine demonstrates mild straightening of the cervical spine. There is interval development of 2-3 mm anterolisthesis of C3 upon C4, likely degenerative in nature. No acute fracture of the cervical spine. Vertebral body height has been preserved. There is again noted severe intervertebral disc space narrowing and endplate remodeling at E0-9 with associated 3 mm retrolisthesis of C5 upon C6 in keeping with changes of advanced degenerative disc disease. Remaining intervertebral disc heights are preserved. The spinal canal is widely patent. The prevertebral soft tissues are unremarkable. Lateral masses of C1 are well aligned with the body of C2. There is asymmetric facet arthrosis noted on the left at C3-4. Bilateral facet arthrosis noted at C4-5 and C5-6, not well profiled on this examination. Will proceed with MRI due to findings and symptoms so we can decide on next steps for treatment      Relevant Orders   DG Cervical Spine 2 or 3 views (Completed)   Urinary incontinence   Relevant Orders   Urinalysis (Completed)   Urine Culture (Completed)   Chronic diastolic congestive heart failure ([place])   Relevant Orders   Ambulatory referral to Cardiology   CBC w/Diff (Completed)   LUQ pain   Relevant  Orders   Amylase (Completed)   Lipase (Completed)   Hypertension    Monitor and report any concerns, no changes to meds. Encouraged heart healthy diet such as the DASH diet and exercise as tolerated.      Anginal pain ([place])    Patient was seen in ER with chest pain and her troponin was found to be  elevated, she was offered admission but her troponin levels began to decrease and her symptoms improved so they sent in her home. She is referred to cardiology at [facility]. Seek care if Chest pain returns and does not resolve.       Relevant Orders   Ambulatory referral to Cardiology   CBC w/Diff (Completed)    Other Visit Diagnoses    Elevated troponin    -  Primary   Relevant Orders   Ambulatory referral to Cardiology   CBC w/Diff (Completed)   Paresthesia       Relevant Orders   DG Cervical Spine 2 or 3 views (Completed)      I have discontinued Van Clines. Tidd's metoCLOPramide. I am also having her maintain her omega-3 acid ethyl esters, ondansetron, albuterol, ASPIRIN 81 PO, pramipexole, primidone, fluticasone, citalopram, potassium chloride, lisinopril, sucralfate, omeprazole, dicyclomine, metoprolol succinate, atorvastatin, levothyroxine, furosemide, and hyoscyamine.  No orders of the defined types were placed in this encounter.    I discussed the assessment and treatment plan with the patient. The patient was provided an opportunity to ask questions and all were answered. The patient agreed with the plan and demonstrated an understanding of the instructions.   The patient was advised to call back or seek an in-person evaluation if the symptoms worsen or if the condition fails to improve as anticipated.  I provided w0 minutes of face-to-face time during this encounter.   Penni Homans, MD

## 2020-03-23 NOTE — Assessment & Plan Note (Signed)
Monitor and report any concerns, no changes to meds. Encouraged heart healthy diet such as the DASH diet and exercise as tolerated.  ?

## 2020-03-24 ENCOUNTER — Other Ambulatory Visit: Payer: Self-pay

## 2020-03-24 ENCOUNTER — Ambulatory Visit (HOSPITAL_BASED_OUTPATIENT_CLINIC_OR_DEPARTMENT_OTHER)
Admission: RE | Admit: 2020-03-24 | Discharge: 2020-03-24 | Disposition: A | Discharge status: Home / Self Care | Payer: Medicare HMO | Source: Ambulatory Visit | Attending: Family Medicine | Admitting: Family Medicine

## 2020-03-24 ENCOUNTER — Other Ambulatory Visit (INDEPENDENT_AMBULATORY_CARE_PROVIDER_SITE_OTHER): Payer: Medicare HMO

## 2020-03-24 DIAGNOSIS — R202 Paresthesia of skin: Secondary | ICD-10-CM | POA: Diagnosis not present

## 2020-03-24 DIAGNOSIS — R32 Unspecified urinary incontinence: Secondary | ICD-10-CM

## 2020-03-24 DIAGNOSIS — R778 Other specified abnormalities of plasma proteins: Secondary | ICD-10-CM | POA: Diagnosis not present

## 2020-03-24 DIAGNOSIS — R1012 Left upper quadrant pain: Secondary | ICD-10-CM

## 2020-03-24 DIAGNOSIS — I209 Angina pectoris, unspecified: Secondary | ICD-10-CM

## 2020-03-24 DIAGNOSIS — I10 Essential (primary) hypertension: Secondary | ICD-10-CM

## 2020-03-24 DIAGNOSIS — I5032 Chronic diastolic (congestive) heart failure: Secondary | ICD-10-CM | POA: Diagnosis not present

## 2020-03-24 DIAGNOSIS — M542 Cervicalgia: Secondary | ICD-10-CM

## 2020-03-25 ENCOUNTER — Telehealth: Payer: Self-pay

## 2020-03-25 ENCOUNTER — Other Ambulatory Visit: Payer: Self-pay | Admitting: Family Medicine

## 2020-03-25 DIAGNOSIS — M542 Cervicalgia: Secondary | ICD-10-CM

## 2020-03-25 DIAGNOSIS — M5030 Other cervical disc degeneration, unspecified cervical region: Secondary | ICD-10-CM

## 2020-03-25 LAB — AMYLASE: Amylase: 44 U/L (ref 21–101)

## 2020-03-25 LAB — CBC WITH DIFFERENTIAL/PLATELET
Absolute Monocytes: 657 cells/uL (ref 200–950)
Basophils Absolute: 0 cells/uL (ref 0–200)
Basophils Relative: 0 %
Eosinophils Absolute: 0 cells/uL — ABNORMAL LOW (ref 15–500)
Eosinophils Relative: 0 %
HCT: 36.1 % (ref 35.0–45.0)
Hemoglobin: 11.7 g/dL (ref 11.7–15.5)
Lymphs Abs: 1791 cells/uL (ref 850–3900)
MCH: 29.8 pg (ref 27.0–33.0)
MCHC: 32.4 g/dL (ref 32.0–36.0)
MCV: 91.9 fL (ref 80.0–100.0)
MPV: 13 fL — ABNORMAL HIGH (ref 7.5–12.5)
Monocytes Relative: 14.6 %
Neutro Abs: 2052 cells/uL (ref 1500–7800)
Neutrophils Relative %: 45.6 %
Platelets: 208 10*3/uL (ref 140–400)
RBC: 3.93 10*6/uL (ref 3.80–5.10)
RDW: 13.1 % (ref 11.0–15.0)
Total Lymphocyte: 39.8 %
WBC: 4.5 10*3/uL (ref 3.8–10.8)

## 2020-03-25 LAB — COMPREHENSIVE METABOLIC PANEL
AG Ratio: 1.8 (calc) (ref 1.0–2.5)
ALT: 12 U/L (ref 6–29)
AST: 20 U/L (ref 10–35)
Albumin: 4.4 g/dL (ref 3.6–5.1)
Alkaline phosphatase (APISO): 72 U/L (ref 37–153)
BUN/Creatinine Ratio: 25 (calc) — ABNORMAL HIGH (ref 6–22)
BUN: 31 mg/dL — ABNORMAL HIGH (ref 7–25)
CO2: 26 mmol/L (ref 20–32)
Calcium: 9.1 mg/dL (ref 8.6–10.4)
Chloride: 103 mmol/L (ref 98–110)
Creat: 1.26 mg/dL — ABNORMAL HIGH (ref 0.60–0.93)
Globulin: 2.5 g/dL (calc) (ref 1.9–3.7)
Glucose, Bld: 132 mg/dL — ABNORMAL HIGH (ref 65–99)
Potassium: 4.3 mmol/L (ref 3.5–5.3)
Sodium: 139 mmol/L (ref 135–146)
Total Bilirubin: 0.7 mg/dL (ref 0.2–1.2)
Total Protein: 6.9 g/dL (ref 6.1–8.1)

## 2020-03-25 LAB — URINALYSIS
Bilirubin Urine: NEGATIVE
Glucose, UA: NEGATIVE
Hgb urine dipstick: NEGATIVE
Ketones, ur: NEGATIVE
Leukocytes,Ua: NEGATIVE
Nitrite: NEGATIVE
Protein, ur: NEGATIVE
Specific Gravity, Urine: 1.006 (ref 1.001–1.03)
pH: 5.5 (ref 5.0–8.0)

## 2020-03-25 LAB — URINE CULTURE
MICRO NUMBER:: 11142830
Result:: NO GROWTH
SPECIMEN QUALITY:: ADEQUATE

## 2020-03-25 LAB — LIPASE: Lipase: 32 U/L (ref 7–60)

## 2020-03-25 NOTE — Progress Notes (Signed)
Pt is aware.  

## 2020-03-25 NOTE — Telephone Encounter (Signed)
-----   Message from [name], MD sent at 03/25/2020  1:00 PM EDT ----- Some severe degenerative changes noted in neck in spots would proceed with MRI if patient agreeable for more information and planning purposes.

## 2020-03-25 NOTE — Telephone Encounter (Signed)
MRI ordered

## 2020-03-25 NOTE — Telephone Encounter (Signed)
Pt is agreeable to MRI can put the order in for pt.

## 2020-03-25 NOTE — Progress Notes (Signed)
Pt aware of result and opened a phone encounter

## 2020-03-26 ENCOUNTER — Other Ambulatory Visit: Payer: Self-pay | Admitting: Family Medicine

## 2020-03-26 NOTE — Telephone Encounter (Signed)
Pharmacy has fax several times with no response.  Name of medication is metoclopromide 10 mg tablet 1 every six hours  [place], [place] - [address] Phone:  [phone]  Fax:  [phone]

## 2020-03-27 ENCOUNTER — Telehealth: Payer: Self-pay | Admitting: Family Medicine

## 2020-03-27 NOTE — Telephone Encounter (Signed)
Patient called and said that she wants an appointment with a Cardiologist in [place]. Please call the patient back [phone].

## 2020-03-27 NOTE — Telephone Encounter (Signed)
I placed the referral to cardiology in [facility] on 10/29 please follow up with referral please

## 2020-03-28 ENCOUNTER — Ambulatory Visit: Payer: Medicare HMO | Admitting: Cardiology

## 2020-03-28 NOTE — Telephone Encounter (Signed)
Pharmacy contacted is closed will contact again at 2pm once reopened

## 2020-03-28 NOTE — Telephone Encounter (Signed)
Pharmacy contacted and informed.

## 2020-03-28 NOTE — Telephone Encounter (Signed)
Patient and I agree she is not going to take this

## 2020-03-28 NOTE — Telephone Encounter (Signed)
Medication not in patient med list or previously prescribed by you

## 2020-03-28 NOTE — Telephone Encounter (Signed)
Called informed pt that referral was placed . Pt will await call from cardiology for appointment

## 2020-04-02 ENCOUNTER — Encounter: Payer: Self-pay | Admitting: Family Medicine

## 2020-04-02 ENCOUNTER — Other Ambulatory Visit: Payer: Self-pay

## 2020-04-02 ENCOUNTER — Emergency Department (HOSPITAL_COMMUNITY): Payer: Medicare HMO

## 2020-04-02 ENCOUNTER — Telehealth: Payer: Self-pay

## 2020-04-02 ENCOUNTER — Emergency Department (HOSPITAL_COMMUNITY)
Admission: EM | Admit: 2020-04-02 | Discharge: 2020-04-02 | Disposition: A | Discharge status: Home / Self Care | Payer: Medicare HMO | Attending: Emergency Medicine | Admitting: Emergency Medicine

## 2020-04-02 DIAGNOSIS — M549 Dorsalgia, unspecified: Secondary | ICD-10-CM | POA: Insufficient documentation

## 2020-04-02 DIAGNOSIS — I2510 Atherosclerotic heart disease of native coronary artery without angina pectoris: Secondary | ICD-10-CM | POA: Insufficient documentation

## 2020-04-02 DIAGNOSIS — R072 Precordial pain: Secondary | ICD-10-CM | POA: Diagnosis not present

## 2020-04-02 DIAGNOSIS — Z79899 Other long term (current) drug therapy: Secondary | ICD-10-CM | POA: Insufficient documentation

## 2020-04-02 DIAGNOSIS — R079 Chest pain, unspecified: Secondary | ICD-10-CM | POA: Diagnosis not present

## 2020-04-02 DIAGNOSIS — Z7982 Long term (current) use of aspirin: Secondary | ICD-10-CM | POA: Diagnosis not present

## 2020-04-02 DIAGNOSIS — J45909 Unspecified asthma, uncomplicated: Secondary | ICD-10-CM | POA: Diagnosis not present

## 2020-04-02 DIAGNOSIS — E039 Hypothyroidism, unspecified: Secondary | ICD-10-CM | POA: Insufficient documentation

## 2020-04-02 DIAGNOSIS — R112 Nausea with vomiting, unspecified: Secondary | ICD-10-CM | POA: Insufficient documentation

## 2020-04-02 DIAGNOSIS — I5033 Acute on chronic diastolic (congestive) heart failure: Secondary | ICD-10-CM | POA: Insufficient documentation

## 2020-04-02 DIAGNOSIS — Z853 Personal history of malignant neoplasm of breast: Secondary | ICD-10-CM | POA: Diagnosis not present

## 2020-04-02 DIAGNOSIS — I110 Hypertensive heart disease with heart failure: Secondary | ICD-10-CM | POA: Insufficient documentation

## 2020-04-02 DIAGNOSIS — Z955 Presence of coronary angioplasty implant and graft: Secondary | ICD-10-CM | POA: Insufficient documentation

## 2020-04-02 LAB — BASIC METABOLIC PANEL
Anion gap: 11 (ref 5–15)
BUN: 24 mg/dL — ABNORMAL HIGH (ref 8–23)
CO2: 24 mmol/L (ref 22–32)
Calcium: 9.2 mg/dL (ref 8.9–10.3)
Chloride: 105 mmol/L (ref 98–111)
Creatinine, Ser: 1.2 mg/dL — ABNORMAL HIGH (ref 0.44–1.00)
GFR, Estimated: 46 mL/min — ABNORMAL LOW (ref >60)
Glucose, Bld: 118 mg/dL — ABNORMAL HIGH (ref 70–99)
Potassium: 3.9 mmol/L (ref 3.5–5.1)
Sodium: 140 mmol/L (ref 135–145)

## 2020-04-02 LAB — TROPONIN I (HIGH SENSITIVITY)
Troponin I (High Sensitivity): 10 ng/L (ref <18)
Troponin I (High Sensitivity): 9 ng/L (ref <18)

## 2020-04-02 LAB — HEPATIC FUNCTION PANEL
ALT: 16 U/L (ref 0–44)
AST: 26 U/L (ref 15–41)
Albumin: 3.7 g/dL (ref 3.5–5.0)
Alkaline Phosphatase: 62 U/L (ref 38–126)
Bilirubin, Direct: 0.1 mg/dL (ref 0.0–0.2)
Indirect Bilirubin: 0.7 mg/dL (ref 0.3–0.9)
Total Bilirubin: 0.8 mg/dL (ref 0.3–1.2)
Total Protein: 6.9 g/dL (ref 6.5–8.1)

## 2020-04-02 LAB — CBC
HCT: 35.5 % — ABNORMAL LOW (ref 36.0–46.0)
Hemoglobin: 11.3 g/dL — ABNORMAL LOW (ref 12.0–15.0)
MCH: 29.3 pg (ref 26.0–34.0)
MCHC: 31.8 g/dL (ref 30.0–36.0)
MCV: 92 fL (ref 80.0–100.0)
Platelets: 195 10*3/uL (ref 150–400)
RBC: 3.86 MIL/uL — ABNORMAL LOW (ref 3.87–5.11)
RDW: 13.9 % (ref 11.5–15.5)
WBC: 3.4 10*3/uL — ABNORMAL LOW (ref 4.0–10.5)
nRBC: 0 % (ref 0.0–0.2)

## 2020-04-02 LAB — LIPASE, BLOOD: Lipase: 34 U/L (ref 11–51)

## 2020-04-02 MED ORDER — ALUM & MAG HYDROXIDE-SIMETH 200-200-20 MG/5ML PO SUSP
30.0000 mL | Freq: Once | ORAL | Status: AC
  Administered 2020-04-02: 30 mL via ORAL
  Filled 2020-04-02: qty 30

## 2020-04-02 MED ORDER — LIDOCAINE VISCOUS HCL 2 % MT SOLN
15.0000 mL | Freq: Once | OROMUCOSAL | Status: AC
  Administered 2020-04-02: 15 mL via ORAL
  Filled 2020-04-02: qty 15

## 2020-04-02 NOTE — Telephone Encounter (Signed)
Nurse Assessment Nurse: Raenette Rover, RN, Zella Ball Date/Time Eilene Ghazi Time): 04/02/2020 9:08:24 AM Confirm and document reason for call. If symptomatic, describe symptoms. ---Caller states she has chest pain, weakness, coughing, and wondering what to do. Feels pressure in the chest the whole chest hurts and into the back as well. Little bit under the left arm and couldn't sleep last night. Chest pain started 3 weeks ago but stronger and hurting worse now. Does the patient have any new or worsening symptoms? ---Yes Will a triage be completed? ---Yes Related visit to physician within the last 2 weeks? ---No Does the PT have any chronic conditions? (i.e. diabetes, asthma, this includes High risk factors for pregnancy, etc.) ---Yes List chronic conditions. ---htn Is this a behavioral health or substance abuse call? ---No Guidelines Guideline Title Affirmed Question Affirmed Notes Nurse Date/Time Eilene Ghazi Time) Chest Pain [1] Chest pain lasts > 5 minutes AND [2] age > 60 Raenette Rover, RN, [facility] 04/02/2020 9:12:08 AM Disp. Time Eilene Ghazi Time) Disposition Final User 04/02/2020 9:07:25 AM Send to Urgent Queue Rosana Fret 04/02/2020 9:23:53 AM 911 Outcome Documentation Raenette Rover, RN, Zella Ball Reason: Considering. PLEASE NOTE: All timestamps contained within this report are represented as [place] Standard Time. CONFIDENTIALTY NOTICE: This fax transmission is intended only for the addressee. It contains information that is legally privileged, confidential or otherwise protected from use or disclosure. If you are not the intended recipient, you are strictly prohibited from reviewing, disclosing, copying using or disseminating any of this information or taking any action in reliance on or regarding this information. If you have received this fax in error, please notify us immediately by telephone so that we can arrange for its return to [name]. Phone: [phone], Toll-Free: [phone], Fax:  [phone] Page: 2 of 2 Call Id: [id number] 04/02/2020 9:15:35 AM Call EMS 911 Now Yes Raenette Rover, RN, [facility] Deaner Disagree/Comply Comply Caller Understands Yes PreDisposition Did not know what to do Care Advice Given Per Guideline CALL EMS 911 NOW: CARE ADVICE given per Chest Pain (Adult) guideline. Referrals GO TO FACILITY OTHER - SPECIFY

## 2020-04-02 NOTE — Discharge Instructions (Signed)
Please read and follow all provided instructions.  Your diagnoses today include:  1. Precordial pain    Tests performed today include:  An EKG of your heart  A chest x-ray  Cardiac enzymes - a blood test for heart muscle damage  Blood counts and electrolytes  Vital signs. See below for your results today.   Medications prescribed:  None -- continue home omeprazole  Take any prescribed medications only as directed.  Follow-up instructions: Please follow-up with your primary care provider as soon as you can for further evaluation of your symptoms.   Return instructions:  SEEK IMMEDIATE MEDICAL ATTENTION IF:  You have severe chest pain, especially if the pain is crushing or pressure-like and spreads to the arms, back, neck, or jaw, or if you have sweating, nausea (feeling sick to your stomach), or shortness of breath. THIS IS AN EMERGENCY. Don't wait to see if the pain will go away. Get medical help at once. Call 911 or 0 (operator). DO NOT drive yourself to the hospital.   Your chest pain gets worse and does not go away with rest.   You have an attack of chest pain lasting longer than usual, despite rest and treatment with the medications your caregiver has prescribed.   You wake from sleep with chest pain or shortness of breath.  You feel dizzy or faint.  You have chest pain not typical of your usual pain for which you originally saw your caregiver.   You have any other emergent concerns regarding your health.  Additional Information: Chest pain comes from many different causes. Your caregiver has diagnosed you as having chest pain that is not specific for one problem, but does not require admission.  You are at low risk for an acute heart condition or other serious illness.   Your vital signs today were: BP 118/70    Pulse (!) 47    Temp (!) 97.4 F (36.3 C)    Resp 12    Ht 4\' 10"  (1.473 m)    Wt 53.1 kg    SpO2 96%    BMI 24.45 kg/m  If your blood pressure (BP) was  elevated above 135/85 this visit, please have this repeated by your doctor within one month. --------------

## 2020-04-02 NOTE — ED Notes (Signed)
Patient verbalizes understanding of discharge instructions. Opportunity for questioning and answers were provided. Pt discharged from ED ambulatory.  

## 2020-04-02 NOTE — Telephone Encounter (Signed)
Called pt and pt is in Hospital on cardiac floor

## 2020-04-02 NOTE — ED Provider Notes (Signed)
[facility] Provider Note   CSN: [id number] Arrival date & time: 04/02/20  1051     History No chief complaint on file.   Julie Wilkerson is a 79 y.o. female.  Patient with mild nonobstructive coronary artery disease seen on cardiac catheterization in 01/2019, history of heart failure, hypertension, hyperlipidemia, hiatal hernia (although normal stomach noted CT 03/04/20) --presents to the emergency department for evaluation of chest and back pain.  Patient states that this has been ongoing for at least a month.  Patient was seen in the emergency department on 03/04/2020, initially was going to be admitted to the hospital due to mildly elevated, but stable troponins in the 30s, however admission was delayed and she was able to be discharged the next day with improving troponins patient describes pain in the mid chest which radiates to her upper back, arm at times.  She has had nausea, especially with eating and with vomiting.  No exertional pain or diaphoresis.  She denies shortness of breath.  No abdominal pain, diarrhea or constipation.  No urinary symptoms.  Patient has some intermittent lower extremity swelling which is at baseline.  Pain was severe last night and early this morning, prompting emergency department visit.  Patient does take aspirin with improvement of her symptoms and symptoms are now mild.        Past Medical History:  Diagnosis Date  . Abdominal pain 11/07/2016  . Accelerating angina ([place]) 02/09/2019  . Acute diastolic CHF (congestive heart failure) ([place]) 02/16/2019  . Acute on chronic diastolic CHF (congestive heart failure) ([place]) 02/23/2019  . Acute pansinusitis 04/26/2017  . Amblyopia of eye, left 04/19/2018  . Anemia   . Anginal pain ([place])   . Anxiety   . Arthritis   . Asthma   . Atypical chest pain 02/04/2019  . Bilateral carotid bruits 03/28/2017  . Blood transfusion without reported diagnosis   . Breast cancer ([place])     2002, left, encapsulated, microcalcifications. lumpectomy, radtiation x 30  . Breast cancer in female [facility])   . Bursitis of left hip 09/04/2018  . Change in bowel habits 02/22/2018  . Change in mole 06/08/2016  . Chest pain 03/04/2020  . CHF (congestive heart failure) ([place])   . 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 Chronic diastolic congestive heart failure ([place]) 05/28/2019  . Colitis   . Complication of anesthesia    VERY SENSITIVE TO ANESTHESIA   . Constipation 01/17/2018  . Coronary artery disease involving native coronary artery of native heart without angina pectoris 05/28/2019  . Cortical age-related cataract of right eye 04/18/2018  . Cough variant asthma  vs UACS from ACEi     Spirometry 12/02/2016  FEV1 1.26 (76%)  Ratio 78 with min curvature p last saba > 6 h prior  - trial off acei 12/02/2016  - 12/02/2016  After extensive coaching HFA effectiveness =    75% with qvar autohaler > rechallenge with 80 2bid    . Decreased visual acuity 01/16/2017  . Degeneration of lumbar intervertebral disc 08/01/2018  . Diarrhea 11/16/2015   D/o Diverticuli, polyps, celiac disease.    Marland Kitchen Dyspnea 11/04/2016   with asthma exacerbation only  . Dysuria 11/04/2016  . Elevated sed rate 10/23/2017  . Essential hypertension    Trial off acei 12/02/2016 due to cough/ pseudoasthma  . Gluten intolerance   . History of chicken pox   . History of Helicobacter pylori infection 08/01/2015  . History of rectal  bleeding 06/01/2019  . Hx of LASIK 04/24/2019  . Hyperglycemia 11/07/2016  . Hyperlipidemia   . Hypertension   . Hyponatremia 02/16/2019  . Hypothyroidism   . IBS (irritable bowel syndrome)   . 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 Left hand pain 05/07/2018  . Left hip pain 03/01/2017  . Left leg pain 03/01/2017  . Left shoulder pain 05/07/2018  . Low back pain 08/10/2015  . LUQ pain 06/01/2019  . 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 Neck  pain 03/01/2017  . Nuclear sclerotic cataract of right eye 04/18/2018  . Osteoporosis   . Overweight (BMI 25.0-29.9) 02/14/2019  . Pain in joint of left shoulder 08/11/2018  . Paroxysmal atrial tachycardia ([place]) 05/28/2019  . Pelvic pain 07/02/2018  . Personal history of radiation therapy   . Pneumonia   . PONV (postoperative nausea and vomiting)   . Poor appetite 02/22/2018  . Pseudophakia of left eye 04/18/2018  . PVD (posterior vitreous detachment), right 04/24/2019  . 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 Right hip pain 10/23/2017  . RLQ discomfort 03/01/2017  . RLS (restless legs syndrome) 05/07/2019  . 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 Sepsis due to pneumonia ([place]) 02/16/2019  . Stomach cramps   . Tremor 02/15/2016  . Urinary frequency 12/22/2017  . Urinary incontinence 05/07/2019  . Urinary tract infection 11/04/2016  . Vitamin D deficiency 04/29/2016    Patient Active Problem List   Diagnosis Date Noted  . PONV (postoperative nausea and vomiting)   . Personal history of radiation therapy   . Hypertension   . Complication of anesthesia   . Colitis   . Breast cancer ([place])   . Blood transfusion without reported diagnosis   . Asthma   . Anxiety   . Anginal pain ([place])   . Chest pain 03/04/2020  . Dysuria 02/04/2020  . Arthritis 09/19/2019  . History of rectal bleeding 06/01/2019  . LUQ pain 06/01/2019  . Chronic diastolic congestive heart failure ([place]) 05/28/2019  . Paroxysmal atrial tachycardia ([place]) 05/28/2019  . Coronary artery disease involving native coronary artery of native heart without angina pectoris 05/28/2019  . Urinary incontinence 05/07/2019  . RLS (restless legs syndrome) 05/07/2019  . Hx of LASIK 04/24/2019  . 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 PVD (posterior vitreous detachment), right 04/24/2019  . 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 Acute on chronic diastolic CHF (congestive heart failure) ([place]) 02/23/2019  . CHF (congestive heart  failure) ([place]) 02/23/2019  . Sepsis due to pneumonia ([place]) 02/16/2019  . Acute diastolic CHF (congestive heart failure) ([place]) 02/16/2019  . Hyponatremia 02/16/2019  . Overweight (BMI 25.0-29.9) 02/14/2019  . 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 Accelerating angina ([place]) 02/09/2019  . Atypical chest pain 02/04/2019  . Bursitis of left hip 09/04/2018  . Pain in joint of left shoulder 08/11/2018  . Degeneration of lumbar intervertebral disc 08/01/2018  . Pelvic pain 07/02/2018  . Left shoulder pain 05/07/2018  . Left hand pain 05/07/2018  . Amblyopia of eye, left 04/19/2018  . Cortical age-related cataract of right eye 04/18/2018  . Nuclear sclerotic cataract of right eye 04/18/2018  . Pseudophakia of left eye 04/18/2018  . Change in bowel habits 02/22/2018  . Nausea and vomiting 02/22/2018  . Poor appetite 02/22/2018  . Anemia 01/17/2018  . Constipation 01/17/2018  . Urinary frequency 12/22/2017  . Right hip pain 10/23/2017  . Elevated sed rate 10/23/2017  .  Acute pansinusitis 04/26/2017  . Bilateral carotid bruits 03/28/2017  . Left hip pain 03/01/2017  . Neck pain on left side 03/01/2017  . RLQ discomfort 03/01/2017  . Left leg pain 03/01/2017  . Decreased visual acuity 01/16/2017  . 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 Hyperglycemia 11/07/2016  . 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 Abdominal pain 11/07/2016  . Urinary tract infection 11/04/2016  . Dyspnea 11/04/2016  . Change in mole 06/08/2016  . 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 Vitamin D deficiency 04/29/2016  . Tremor 02/15/2016  . Diarrhea 11/16/2015  . Low back pain 08/10/2015  . History of Helicobacter pylori infection 08/01/2015  . Cough variant asthma  vs UACS from ACEi    . Breast cancer in female [facility])   . Hyperlipidemia   . Essential hypertension   . Osteoporosis   . Hypothyroid   . IBS (irritable bowel syndrome)   . Gluten intolerance   . History of chicken pox     Past Surgical History:    Procedure Laterality Date  . ABDOMINAL HYSTERECTOMY     partial  . APPENDECTOMY    . BREAST LUMPECTOMY Left 2002  . CHOLECYSTECTOMY  Age 6 or 56  . COLONOSCOPY  2017  . EYE SURGERY Left    cataract  . LEFT HEART CATH AND CORONARY ANGIOGRAPHY N/A 02/09/2019   Procedure: LEFT HEART CATH AND CORONARY ANGIOGRAPHY;  Surgeon: Nelva Bush, MD;  Location: [facility];  Service: Cardiovascular;  Laterality: N/A;  . TONSILLECTOMY    . TOTAL HIP ARTHROPLASTY Left 02/13/2019   Procedure: TOTAL HIP ARTHROPLASTY ANTERIOR APPROACH;  Surgeon: Paralee Cancel, MD;  Location: [facility];  Service: Orthopedics;  Laterality: Left;  70 mins     OB History    Gravida  4   Para  3   Term  3   Preterm      AB  1   Living  3     SAB  1   TAB      Ectopic      Multiple      Live Births  3           Family History  Problem Relation Age of Onset  . Colitis Mother   . Irritable bowel syndrome Mother   . Hypertension Father   . Hyperlipidemia Brother   . Heart attack Brother   . Stomach cancer Paternal Grandmother 63  . Colon cancer Paternal Grandmother   . Hyperlipidemia Brother   . Heart attack Brother   . Hyperlipidemia Brother   . Heart attack Brother   . Hyperlipidemia Sister   . Leukemia Daughter   . Arthritis Daughter   . Heart disease Daughter        ASD vs VSD  . Esophageal cancer Neg Hx     Social History   Tobacco Use  . Smoking status: Never Smoker  . Smokeless tobacco: Never Used  Vaping Use  . Vaping Use: Never used  Substance Use Topics  . Alcohol use: Never    Alcohol/week: 0.0 standard drinks  . Drug use: Never    Home Medications Prior to Admission medications   Medication Sig Start Date End Date Taking? Authorizing Provider  albuterol (VENTOLIN HFA) 108 (90 Base) MCG/ACT inhaler Inhale 2 puffs into the lungs every 6 (six) hours as needed for wheezing or shortness of breath. 02/26/19   [name], MD  ASPIRIN 81 PO 81 mg daily.  [provider]  atorvastatin (LIPITOR) 20 MG tablet Take 1 tablet by mouth once daily 02/14/20   Tobb, Kardie, DO  citalopram (CELEXA) 20 MG tablet TAKE 1 TABLET BY MOUTH AT BEDTIME 10/08/19   [name], MD  dicyclomine (BENTYL) 10 MG capsule TAKE 1 CAPSULE BY MOUTH EVERY 8 HOURS AS NEEDED FOR SPASMS 11/21/19   Armbruster, Carlota Raspberry, MD  fluticasone (FLONASE) 50 MCG/ACT nasal spray Place 2 sprays into both nostrils daily as needed for allergies or rhinitis. 09/18/19   [name], MD  furosemide (LASIX) 40 MG tablet Take 1 tablet by mouth once daily 03/17/20   [name], MD  hyoscyamine (LEVSIN SL) 0.125 MG SL tablet DISSOLVE 1 TABLET UNDER THE TONGUE EVERY 6 HOURS AS NEEDED 03/17/20   [name], MD  levothyroxine (EUTHYROX) 50 MCG tablet Take 1 tablet (50 mcg total) by mouth daily. 02/28/20   [name], MD  lisinopril (ZESTRIL) 5 MG tablet Take 1 tablet by mouth twice daily Patient taking differently: Take 5 mg by mouth daily.  10/31/19   [name], MD  metoprolol succinate (TOPROL-XL) 25 MG 24 hr tablet TAKE 1 TABLET BY MOUTH AT BEDTIME 02/13/20   [name], MD  omega-3 acid ethyl esters (LOVAZA) 1 g capsule Take 2 g by mouth daily.    [provider]  omeprazole (PRILOSEC) 40 MG capsule Take 1 capsule (40 mg total) by mouth in the morning and at bedtime. Take 1 tablet by mouth once to twice daily 11/20/19   Armbruster, Carlota Raspberry, MD  ondansetron (ZOFRAN) 4 MG tablet Take 1 tablet (4 mg total) by mouth every 8 (eight) hours as needed for nausea or vomiting. 02/26/19   [name], MD  potassium chloride (KLOR-CON) 10 MEQ tablet Take 1 tablet by mouth once daily 10/23/19   [name], MD  pramipexole (MIRAPEX) 0.125 MG tablet Take 1 tablet (0.125 mg total) by mouth 2 (two) times daily. 05/03/19   [name], MD  primidone (MYSOLINE) 50 MG tablet TAKE 1 TABLET BY MOUTH AT BEDTIME 08/20/19   Tat, Eustace Quail, DO  sucralfate (CARAFATE) 1 g tablet  Take 1 tablet (1 g total) by mouth 2 (two) times daily. 11/16/19   Saguier, Percell Miller, PA-C    Allergies    Patient has no known allergies.  Review of Systems   Review of Systems  Constitutional: Negative for fever.  HENT: Negative for rhinorrhea and sore throat.   Eyes: Negative for redness.  Respiratory: Negative for cough and shortness of breath.   Cardiovascular: Positive for chest pain.  Gastrointestinal: Positive for nausea. Negative for abdominal pain, diarrhea and vomiting.  Genitourinary: Negative for dysuria, frequency, hematuria and urgency.  Musculoskeletal: Positive for back pain. Negative for myalgias.  Skin: Negative for rash.  Neurological: Negative for headaches.    Physical Exam Updated Vital Signs BP 118/63   Pulse (!) 57   Temp (!) 97.4 F (36.3 C)   Resp 18   SpO2 94%   Physical Exam Vitals and nursing note reviewed.  Constitutional:      Appearance: She is well-developed. She is not diaphoretic.  HENT:     Head: Normocephalic and atraumatic.     Mouth/Throat:     Mouth: Mucous membranes are not dry.  Eyes:     Conjunctiva/sclera: Conjunctivae normal.  Neck:     Vascular: Normal carotid pulses. No carotid bruit or JVD.     Trachea: Trachea  normal. No tracheal deviation.  Cardiovascular:     Rate and Rhythm: Normal rate and regular rhythm.     Pulses: No decreased pulses.     Heart sounds: Normal heart sounds, S1 normal and S2 normal. No murmur heard.   Pulmonary:     Effort: Pulmonary effort is normal. No respiratory distress.     Breath sounds: No wheezing.  Chest:     Chest wall: No tenderness.  Abdominal:     General: Bowel sounds are normal.     Palpations: Abdomen is soft.     Tenderness: There is no abdominal tenderness. There is no guarding or rebound.  Musculoskeletal:        General: No tenderness. Normal range of motion.     Cervical back: Normal range of motion and neck supple. No muscular tenderness.     Right lower leg: Edema  present.     Left lower leg: Edema present.     Comments: Trace ankle swelling.   Skin:    General: Skin is warm and dry.     Coloration: Skin is not pale.     Findings: No rash.  Neurological:     Mental Status: She is alert.     ED Results / Procedures / Treatments   Labs (all labs ordered are listed, but only abnormal results are displayed) Labs Reviewed  BASIC METABOLIC PANEL - Abnormal; Notable for the following components:      Result Value   Glucose, Bld 118 (*)    BUN 24 (*)    Creatinine, Ser 1.20 (*)    GFR, Estimated 46 (*)    All other components within normal limits  CBC - Abnormal; Notable for the following components:   WBC 3.4 (*)    RBC 3.86 (*)    Hemoglobin 11.3 (*)    HCT 35.5 (*)    All other components within normal limits  LIPASE, BLOOD  HEPATIC FUNCTION PANEL  TROPONIN I (HIGH SENSITIVITY)  TROPONIN I (HIGH SENSITIVITY)    ED ECG REPORT   Date: 04/02/2020  Rate: 55  Rhythm: sinus bradycardia  QRS Axis: normal  Intervals: normal  ST/T Wave abnormalities: nonspecific T wave changes  Conduction Disutrbances:none  Narrative Interpretation:   Old EKG Reviewed: unchanged from 03/04/20  I have personally reviewed the EKG tracing and agree with the computerized printout as noted.  Radiology DG Chest 2 View  Result Date: 04/02/2020 CLINICAL DATA:  Chest pain EXAM: CHEST - 2 VIEW COMPARISON:  03/04/2020 FINDINGS: Normal heart size and mediastinal contours. Mild scar-like opacity in the bilateral lungs. No edema, effusion, or air bronchogram. Normal heart size. No visible effusion or pneumothorax. IMPRESSION: No evidence of active disease. Electronically Signed   By: Monte Fantasia M.D.   On: 04/02/2020 11:14    Procedures Procedures (including critical care time)  Medications Ordered in ED Medications  alum & mag hydroxide-simeth (MAALOX/MYLANTA) 200-200-20 MG/5ML suspension 30 mL (30 mLs Oral Given 04/02/20 1213)    And  lidocaine  (XYLOCAINE) 2 % viscous mouth solution 15 mL (15 mLs Oral Given 04/02/20 1213)    ED Course  I have reviewed the triage vital signs and the nursing notes.  Pertinent labs & imaging results that were available during my care of the patient were reviewed by me and considered in my medical decision making (see chart for details).  Patient seen and examined. Work-up initiated. Medications ordered.  Suspect GI cause but will evaluate for cardiac etiology.  Reviewed  CT imaging from a month ago.  I do not feel that the patient checked again for PE or dissection.  Symptoms have really been ongoing since that time.  Vital signs reviewed and are as follows: BP 118/63   Pulse (!) 57   Temp (!) 97.4 F (36.3 C)   Resp 18   SpO2 94%   2:40 PM Pt continues to do well.  She states that the GI cocktail made her feel nauseous however took the pain away and she is currently pain-free.  We reviewed results of bedside.  We discussed her cardiac enzymes which are normal today x2.  Patient is anxious to go home and states she is comfortable with going home.  Pt discussed with and seen earlier by Dr. Sedonia Small. He is in agreement with plan for discharge.   Encouraged to continue PCP follow-up and cardiology follow-up as planned.  Patient was counseled to return with severe chest pain, especially if the pain is crushing or pressure-like and spreads to the arms, back, neck, or jaw, or if they have sweating, nausea, or shortness of breath with the pain. They were encouraged to call 911 with these symptoms.   They were also told to return if their chest pain gets worse and does not go away with rest, they have an attack of chest pain lasting longer than usual despite rest and treatment with the medications their caregiver has prescribed, if they wake from sleep with chest pain or shortness of breath, if they feel dizzy or faint, if they have chest pain not typical of their usual pain, or if they have any other emergent  concerns regarding their health.  The patient verbalized understanding and agreed.      MDM Rules/Calculators/A&P                          Patient with chest and back pain, recent work-up in the ED.  Plan to follow-up with cardiology.  She has been in contact with her primary care doctor as well.  Today, pain is resolved with GI cocktail.  Troponin 10>9.  EKG was nonischemic and unchanged.  Low concern for cardiac etiology, ACS, dissection, PE.  Likely GI related etiology.  Patient to follow-up as above.  Comfortable discharged home.   Final Clinical Impression(s) / ED Diagnoses Final diagnoses:  Precordial pain    Rx / DC Orders ED Discharge Orders    None       Carlisle Cater, PA-C 04/02/20 1445    Maudie Flakes, MD 04/02/20 [phone]

## 2020-04-02 NOTE — ED Triage Notes (Signed)
Pt here from home with c/o chest and back pain over the last 2 weeks , pt has a out pt mri ordered but not done as of yet

## 2020-04-07 NOTE — Assessment & Plan Note (Signed)
Worsening and with radicular symptoms of pain and weakness into left arm. Xray shows: Four view radiograph cervical spine demonstrates mild straightening of the cervical spine. There is interval development of 2-3 mm anterolisthesis of C3 upon C4, likely degenerative in nature. No acute fracture of the cervical spine. Vertebral body height has been preserved. There is again noted severe intervertebral disc space narrowing and endplate remodeling at E7-0 with associated 3 mm retrolisthesis of C5 upon C6 in keeping with changes of advanced degenerative disc disease. Remaining intervertebral disc heights are preserved. The spinal canal is widely patent. The prevertebral soft tissues are unremarkable. Lateral masses of C1 are well aligned with the body of C2. There is asymmetric facet arthrosis noted on the left at C3-4. Bilateral facet arthrosis noted at C4-5 and C5-6, not well profiled on this examination. Will proceed with MRI due to findings and symptoms so we can decide on next steps for treatment

## 2020-04-09 ENCOUNTER — Other Ambulatory Visit: Payer: Self-pay | Admitting: Family Medicine

## 2020-04-09 DIAGNOSIS — M47812 Spondylosis without myelopathy or radiculopathy, cervical region: Secondary | ICD-10-CM

## 2020-04-09 DIAGNOSIS — M542 Cervicalgia: Secondary | ICD-10-CM

## 2020-04-11 ENCOUNTER — Other Ambulatory Visit: Payer: Self-pay | Admitting: Family Medicine

## 2020-04-11 ENCOUNTER — Telehealth: Payer: Self-pay | Admitting: Family Medicine

## 2020-04-11 NOTE — Telephone Encounter (Signed)
Patient states she would like to go to [facility] : Richard D. Ramos, MD, please change referral reflect this provider.

## 2020-04-14 NOTE — Telephone Encounter (Signed)
Done

## 2020-04-15 ENCOUNTER — Other Ambulatory Visit: Payer: Self-pay

## 2020-04-15 ENCOUNTER — Ambulatory Visit (HOSPITAL_BASED_OUTPATIENT_CLINIC_OR_DEPARTMENT_OTHER): Payer: Medicare HMO | Attending: Cardiology | Admitting: Cardiovascular Disease

## 2020-04-15 DIAGNOSIS — G4733 Obstructive sleep apnea (adult) (pediatric): Secondary | ICD-10-CM | POA: Insufficient documentation

## 2020-04-15 DIAGNOSIS — R0902 Hypoxemia: Secondary | ICD-10-CM | POA: Diagnosis not present

## 2020-04-15 DIAGNOSIS — G4734 Idiopathic sleep related nonobstructive alveolar hypoventilation: Secondary | ICD-10-CM

## 2020-04-21 ENCOUNTER — Other Ambulatory Visit (HOSPITAL_BASED_OUTPATIENT_CLINIC_OR_DEPARTMENT_OTHER): Payer: Self-pay

## 2020-04-21 DIAGNOSIS — G4734 Idiopathic sleep related nonobstructive alveolar hypoventilation: Secondary | ICD-10-CM

## 2020-04-22 ENCOUNTER — Ambulatory Visit: Payer: Medicare HMO | Admitting: Gastroenterology

## 2020-04-22 ENCOUNTER — Other Ambulatory Visit (HOSPITAL_BASED_OUTPATIENT_CLINIC_OR_DEPARTMENT_OTHER): Payer: Self-pay | Admitting: Internal Medicine

## 2020-04-22 ENCOUNTER — Ambulatory Visit: Payer: Medicare HMO | Attending: Internal Medicine

## 2020-04-22 ENCOUNTER — Encounter: Payer: Self-pay | Admitting: Gastroenterology

## 2020-04-22 ENCOUNTER — Encounter (HOSPITAL_BASED_OUTPATIENT_CLINIC_OR_DEPARTMENT_OTHER): Payer: Self-pay | Admitting: Cardiovascular Disease

## 2020-04-22 VITALS — BP 114/60 | HR 64 | Ht <= 58 in | Wt 120.2 lb

## 2020-04-22 DIAGNOSIS — R1013 Epigastric pain: Secondary | ICD-10-CM

## 2020-04-22 DIAGNOSIS — R194 Change in bowel habit: Secondary | ICD-10-CM | POA: Diagnosis not present

## 2020-04-22 DIAGNOSIS — R109 Unspecified abdominal pain: Secondary | ICD-10-CM

## 2020-04-22 DIAGNOSIS — Z23 Encounter for immunization: Secondary | ICD-10-CM

## 2020-04-22 DIAGNOSIS — K449 Diaphragmatic hernia without obstruction or gangrene: Secondary | ICD-10-CM

## 2020-04-22 MED ORDER — ONDANSETRON HCL 4 MG PO TABS
ORAL_TABLET | ORAL | 3 refills | Status: DC
Start: 2020-04-22 — End: 2022-09-17

## 2020-04-22 MED ORDER — DICYCLOMINE HCL 10 MG PO CAPS
10.0000 mg | ORAL_CAPSULE | Freq: Two times a day (BID) | ORAL | 1 refills | Status: DC
Start: 2020-04-22 — End: 2020-10-09

## 2020-04-22 MED FILL — PFIZER-BIONTECH COVID-19 VA: 30 | 1 days supply | Qty: 0 | Fill #0

## 2020-04-22 NOTE — Patient Instructions (Addendum)
If you are age 78 or older, your body mass index should be between 23-30. Your Body mass index is 26.25 kg/m. If this is out of the aforementioned range listed, please consider follow up with your Primary Care Provider.  If you are age 100 or younger, your body mass index should be between 19-25. Your Body mass index is 26.25 kg/m. If this is out of the aformentioned range listed, please consider follow up with your Primary Care Provider.   We have sent the following medications to your pharmacy for you to pick up at your convenience: Bentyl: Take once or twice a day, EVERY day. Zofran 4 mg ODT:  Take once or twice a day as needed  We have given you samples of the following medication to take: FDgard: Take as directed as needed  Thank you for entrusting me with your care and for choosing [facility], Dr. [place] Cellar

## 2020-04-22 NOTE — Progress Notes (Signed)
HPI :  79 year old female here for follow-up visit.  She had a history of GERD, dyspepsia, altered bowels.  She was last seen in July.  At that time she had complained of some hematemesis, epigastric pain, early satiety.  Omeprazole was increased to 40 mg twice daily.  EGD was performed showing 5 cm hiatal hernia, no high risk pathology.  Biopsy of the stomach and small bowel did not show any concerning problems.  She has been doing pretty well on omeprazole twice daily and Carafate as needed.  She has had some nausea for the past month or so with decreased appetite.  She does have some ongoing early satiety.  Weight is fluctuated about 3 pounds over the past month.  Zofran helps when she takes it but she does not take it routinely.  Recall that she has had a normal gastric emptying study in 2020.  She had a negative CT angio of the chest abdomen and pelvis this October which is reassuring.  She is not having much reflux or heartburn currently.  At the last time I saw her she was endorsing loose stools with urgency and fecal incontinence at times.  She had a colonoscopy in March which was negative for microscopic colitis.  I gave her an empiric trial of Colestid given her history of cholecystectomy in the past.  Somewhere along the line she stopped it, unclear what her response was to this.  She states she may not even have tried it, her stools have been much better lately as she reportedly stopped a medication she was previously on that could have been related.  She cannot recall which medication that was, she thinks it was the cause of her loose stools.  She is not really having any problems with that right now.  She does continue to have some abdominal discomfort at times that has been ongoing in her left side that is sporadic and can sometimes be worse with a bowel movement.  Bentyl does help this.  Prior work-up: CT scan 10/24/18 - spinal stenosis lumbar spine  CT scan 12/23/2017 - diverticulosis,no  cause for pain CT scan 07/19/2017 - no cause for pain noted  Colonoscopy 08/05/2015 - AVM in the cecum noted, ablated with APC. Ileum normal. No polyps or evidence of colitis. Biopsies not taken. EGD 08/05/2015 -5cm HH,mild gastritis on path, mild esophagitis with esophagus with normal path, normal duodenum on path  CT scan 06/06/2019: IMPRESSION: 1. No acute findings in the abdomen or pelvis. 2. Moderate hiatal hernia with 25-50% of the stomach contained in the chest. 3. Left colonic diverticulosis without diverticulitis. 4. Aortic Atherosclerois (ICD10-170.0)  GES 12/07/18 - normal  Colonoscopy 08/14/19 - - The examined portion of the ileum was normal. - One diminutive polyp in the cecum, removed with a cold biopsy forceps. Resected and retrieved. - Two diminutive polyps in the transverse colon, removed with a cold biopsy forceps. Resected and retrieved. - One 5 mm polyp at the splenic flexure, removed with a cold snare. Resected and retrieved. - One 6 mm polyp in the descending colon, removed with a cold snare. Resected and retrieved. - Diverticulosis in the sigmoid colon. - Internal hemorrhoids. - The examination was otherwise normal. - Biopsies were taken with a cold forceps from the right colon, left colon and transverse colon for evaluation of microscopic colitis.  Path negative for MC   EGD 11/28/19 -  - A 5 cm hiatal hernia was present. No obvious cameron lesions noted. - The  exam of the esophagus was otherwise normal. - Patchy mildly erythematous mucosa was found in the gastric antrum. - The exam of the stomach was otherwise normal. No ulcers - Biopsies were taken with a cold forceps in the gastric body, at the incisura and in the gastric antrum for Helicobacter pylori testing. - The duodenal bulb and second portion of the duodenum were normal. Biopsies for histology were taken with a cold forceps for evaluation of celiac disease.  1. Surgical [P], duodenal  bulb, 2nd portion of duodenum and distal duodenum - BENIGN DUODENAL MUCOSA. - NO FEATURES OF CELIAC SPRUE OR GRANULOMAS. 2. Surgical [P], gastric antrum and gastric body - ANTRAL AND OXYNTIC MUCOSA WITH HYPEREMIA. - WARTHIN-STARRY NEGATIVE FOR HELICOBACTER PYLORI. - NO INTESTINAL METAPLASIA, DYSPLASIA OR CARCINOMA.   CT angio chest / abdomen / pelvis 03/04/20 - IMPRESSION: 1. Normal contour and caliber of the thoracic and abdominal aorta. No evidence of aneurysm, dissection, or other acute aortic pathology. Scattered atherosclerosis. 2. No acute findings in the chest, abdomen or pelvis to explain pain. 3. Small hiatal hernia. 4. Sigmoid diverticulosis without evidence of diverticulitis. 5. Status post cholecystectomy and hysterectomy   Past Medical History:  Diagnosis Date  . Abdominal pain 11/07/2016  . Accelerating angina ([place]) 02/09/2019  . Acute diastolic CHF (congestive heart failure) ([place]) 02/16/2019  . Acute on chronic diastolic CHF (congestive heart failure) ([place]) 02/23/2019  . Acute pansinusitis 04/26/2017  . Amblyopia of eye, left 04/19/2018  . Anemia   . Anginal pain ([place])   . Anxiety   . Arthritis   . Asthma   . Atypical chest pain 02/04/2019  . Bilateral carotid bruits 03/28/2017  . Blood transfusion without reported diagnosis   . Breast cancer ([place])    2002, left, encapsulated, microcalcifications. lumpectomy, radtiation x 30  . Breast cancer in female [facility])   . Bursitis of left hip 09/04/2018  . Change in bowel habits 02/22/2018  . Change in mole 06/08/2016  . Chest pain 03/04/2020  . CHF (congestive heart failure) ([place])   . 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 Chronic diastolic congestive heart failure ([place]) 05/28/2019  . Colitis   . Complication of anesthesia    VERY SENSITIVE TO ANESTHESIA   . Constipation 01/17/2018  . Coronary artery disease involving native coronary artery of native heart without angina pectoris 05/28/2019  . Cortical age-related  cataract of right eye 04/18/2018  . Cough variant asthma  vs UACS from ACEi     Spirometry 12/02/2016  FEV1 1.26 (76%)  Ratio 78 with min curvature p last saba > 6 h prior  - trial off acei 12/02/2016  - 12/02/2016  After extensive coaching HFA effectiveness =    75% with qvar autohaler > rechallenge with 80 2bid    . Decreased visual acuity 01/16/2017  . Degeneration of lumbar intervertebral disc 08/01/2018  . Diarrhea 11/16/2015   D/o Diverticuli, polyps, celiac disease.    Marland Kitchen Dyspnea 11/04/2016   with asthma exacerbation only  . Dysuria 11/04/2016  . Elevated sed rate 10/23/2017  . Essential hypertension    Trial off acei 12/02/2016 due to cough/ pseudoasthma  . Gluten intolerance   . History of chicken pox   . History of Helicobacter pylori infection 08/01/2015  . History of rectal bleeding 06/01/2019  . Hx of LASIK 04/24/2019  . Hyperglycemia 11/07/2016  . Hyperlipidemia   . Hypertension   . Hyponatremia 02/16/2019  . Hypothyroidism   . IBS (irritable bowel syndrome)   .  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 Left hand pain 05/07/2018  . Left hip pain 03/01/2017  . Left leg pain 03/01/2017  . Left shoulder pain 05/07/2018  . Low back pain 08/10/2015  . LUQ pain 06/01/2019  . 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 Neck pain 03/01/2017  . Nuclear sclerotic cataract of right eye 04/18/2018  . Osteoporosis   . Overweight (BMI 25.0-29.9) 02/14/2019  . Pain in joint of left shoulder 08/11/2018  . Paroxysmal atrial tachycardia ([place]) 05/28/2019  . Pelvic pain 07/02/2018  . Personal history of radiation therapy   . Pneumonia   . PONV (postoperative nausea and vomiting)   . Poor appetite 02/22/2018  . Pseudophakia of left eye 04/18/2018  . PVD (posterior vitreous detachment), right 04/24/2019  . 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 Right hip pain 10/23/2017  . RLQ discomfort 03/01/2017  . RLS (restless legs syndrome) 05/07/2019  . S/P left THA, AA  02/13/2019  . Sepsis due to pneumonia ([place]) 02/16/2019  . Stomach cramps   . Tremor 02/15/2016  . Urinary frequency 12/22/2017  . Urinary incontinence 05/07/2019  . Urinary tract infection 11/04/2016  . Vitamin D deficiency 04/29/2016     Past Surgical History:  Procedure Laterality Date  . ABDOMINAL HYSTERECTOMY     partial  . APPENDECTOMY    . BREAST LUMPECTOMY Left 2002  . CHOLECYSTECTOMY  Age 41 or 57  . COLONOSCOPY  2017  . EYE SURGERY Left    cataract  . LEFT HEART CATH AND CORONARY ANGIOGRAPHY N/A 02/09/2019   Procedure: LEFT HEART CATH AND CORONARY ANGIOGRAPHY;  Surgeon: Nelva Bush, MD;  Location: [facility];  Service: Cardiovascular;  Laterality: N/A;  . TONSILLECTOMY    . TOTAL HIP ARTHROPLASTY Left 02/13/2019   Procedure: TOTAL HIP ARTHROPLASTY ANTERIOR APPROACH;  Surgeon: Paralee Cancel, MD;  Location: [facility];  Service: Orthopedics;  Laterality: Left;  70 mins   Family History  Problem Relation Age of Onset  . Colitis Mother   . Irritable bowel syndrome Mother   . Hypertension Father   . Hyperlipidemia Brother   . Heart attack Brother   . Stomach cancer Paternal Grandmother 52  . Colon cancer Paternal Grandmother   . Hyperlipidemia Brother   . Heart attack Brother   . Hyperlipidemia Brother   . Heart attack Brother   . Hyperlipidemia Sister   . Leukemia Daughter   . Arthritis Daughter   . Heart disease Daughter        ASD vs VSD  . Esophageal cancer Neg Hx    Social History   Tobacco Use  . Smoking status: Never Smoker  . Smokeless tobacco: Never Used  Vaping Use  . Vaping Use: Never used  Substance Use Topics  . Alcohol use: Never    Alcohol/week: 0.0 standard drinks  . Drug use: Never   Current Outpatient Medications  Medication Sig Dispense Refill  . albuterol (VENTOLIN HFA) 108 (90 Base) MCG/ACT inhaler Inhale 2 puffs into the lungs every 6 (six) hours as needed for wheezing or shortness of breath. 18 g 2  . ASPIRIN 81 PO 81 mg daily.      Marland Kitchen atorvastatin (LIPITOR) 20 MG tablet Take 1 tablet by mouth once daily 90 tablet 0  . citalopram (CELEXA) 20 MG tablet Take 1 tablet (20 mg total) by mouth at bedtime. 90 tablet 0  . dicyclomine (BENTYL) 10 MG capsule TAKE 1 CAPSULE BY MOUTH EVERY  8 HOURS AS NEEDED FOR SPASMS 60 capsule 1  . fluticasone (FLONASE) 50 MCG/ACT nasal spray Place 2 sprays into both nostrils daily as needed for allergies or rhinitis. 16 g 5  . furosemide (LASIX) 40 MG tablet Take 1 tablet by mouth once daily 90 tablet 0  . hyoscyamine (LEVSIN SL) 0.125 MG SL tablet DISSOLVE 1 TABLET UNDER THE TONGUE EVERY 6 HOURS AS NEEDED 30 tablet 0  . levothyroxine (EUTHYROX) 50 MCG tablet Take 1 tablet (50 mcg total) by mouth daily. 90 tablet 0  . lisinopril (ZESTRIL) 5 MG tablet Take 1 tablet by mouth twice daily (Patient taking differently: Take 5 mg by mouth daily. ) 180 tablet 1  . metoprolol succinate (TOPROL-XL) 25 MG 24 hr tablet TAKE 1 TABLET BY MOUTH AT BEDTIME 90 tablet 1  . omega-3 acid ethyl esters (LOVAZA) 1 g capsule Take 2 g by mouth daily.    Marland Kitchen omeprazole (PRILOSEC) 40 MG capsule Take 1 capsule (40 mg total) by mouth in the morning and at bedtime. Take 1 tablet by mouth once to twice daily 60 capsule 5  . ondansetron (ZOFRAN) 4 MG tablet Take 1 tablet (4 mg total) by mouth every 8 (eight) hours as needed for nausea or vomiting. 30 tablet 1  . potassium chloride (KLOR-CON) 10 MEQ tablet Take 1 tablet by mouth once daily 90 tablet 1  . pramipexole (MIRAPEX) 0.125 MG tablet Take 1 tablet (0.125 mg total) by mouth 2 (two) times daily. 60 tablet 1  . sucralfate (CARAFATE) 1 g tablet Take 1 tablet (1 g total) by mouth 2 (two) times daily. 60 tablet 0   No current facility-administered medications for this visit.   No Known Allergies   Review of Systems: All systems reviewed and negative except where noted in HPI.   Lab Results  Component Value Date   WBC 3.4 (L) 04/02/2020   HGB 11.3 (L) 04/02/2020   HCT 35.5  (L) 04/02/2020   MCV 92.0 04/02/2020   PLT 195 04/02/2020    Lab Results  Component Value Date   CREATININE 1.20 (H) 04/02/2020   BUN 24 (H) 04/02/2020   NA 140 04/02/2020   K 3.9 04/02/2020   CL 105 04/02/2020   CO2 24 04/02/2020    Lab Results  Component Value Date   ALT 16 04/02/2020   AST 26 04/02/2020   ALKPHOS 62 04/02/2020   BILITOT 0.8 04/02/2020     Physical Exam: BP 114/60 (BP Location: Left Arm, Patient Position: Sitting, Cuff Size: Normal)   Pulse 64   Ht 4' 8.75" (1.441 m) Comment: height measured without shoes  Wt 120 lb 4 oz (54.5 kg)   BMI 26.25 kg/m  Constitutional: Pleasant,well-developed, female in no acute distress. Cardiovascular: Normal rate, regular rhythm.  Pulmonary/chest: Effort normal and breath sounds normal. Abdominal: Soft, nondistended, nontender.  There are no masses palpable.  Extremities: no edema Lymphadenopathy: No cervical adenopathy noted. Neurological: Alert and oriented to person place and time. Skin: Skin is warm and dry. No rashes noted. Psychiatric: Normal mood and affect. Behavior is normal.   ASSESSMENT AND PLAN: 79 year old female here for reassessment of the following:  Dyspepsia / Hiatal hernia / GERD - she is had a recent EGD showing large hiatal hernia, reflux is well controlled on present dosing of PPI and Carafate.  She has a negative gastric emptying study, CT chest abdomen pelvis recently done without concerning findings.  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  We discussed what this is.  She can continue Zofran scheduled or as needed for her intermittent nausea.  Recommend a trial of FD guard which is safe and often effective in this situation, free sample provided today hopefully this can help her.  Reassured her on work-up to date otherwise.  She agreed with the plan can follow-up as needed.  Abdominal pain / altered bowel habits - suspect bowel spasm/functional  bowel disorder as cause of symptoms.  Again CT scan and recent colonoscopy reassuring.  Her loose stools have essentially resolved and no longer bothering her.  Recommend she take the Bentyl daily to twice daily as baseline as this really works for her symptoms but she does not take it routinely, if symptoms persist despite this she can follow-up as needed.  All questions answered  Lincoln Cellar, MD [facility]

## 2020-04-22 NOTE — Progress Notes (Signed)
   Covid-19 Vaccination Clinic  Name:  Devanny Palecek    MRN: [id number] DOB: [birth year]  04/22/2020  Ms. Mackley was observed post Covid-19 immunization for 15 minutes without incident. She was provided with Vaccine Information Sheet and instruction to access the V-Safe system.   Ms. Puzio was instructed to call 911 with any severe reactions post vaccine: Marland Kitchen Difficulty breathing  . Swelling of face and throat  . A fast heartbeat  . A bad rash all over body  . Dizziness and weakness   Immunizations Administered    Name Date Dose VIS Date Route   Pfizer COVID-19 Vaccine 04/22/2020  2:53 PM 0.3 mL 03/12/2020 Intramuscular   Manufacturer: [place]   Lot: [id number]   [place]: [id number]

## 2020-04-22 NOTE — Procedures (Signed)
     Patient Name: Julie Wilkerson, Julie Wilkerson Date: 04/15/2020 Gender: Female D.O.B: 1941-01-29 Age (years): 62 Referring Provider: Godfrey Pick Tobb DO Height (inches): 58 Interpreting Physician: Shelva Majestic MD, ABSM Weight (lbs): 120 RPSGT: Jacolyn Reedy BMI: 25 MRN: [id number] Neck Size: 12.00  CLINICAL INFORMATION Sleep Study Type: HST  Indication for sleep study: snoring, nocturnal hypoxemia  Epworth Sleepiness Score: 6  SLEEP STUDY TECHNIQUE A multi-channel overnight portable sleep study was performed. The channels recorded were: nasal airflow, thoracic respiratory movement, and oxygen saturation with a pulse oximetry. Snoring was also monitored.  MEDICATIONS albuterol (VENTOLIN HFA) 108 (90 Base) MCG/ACT inhaler ASPIRIN 81 PO atorvastatin (LIPITOR) 20 MG tablet citalopram (CELEXA) 20 MG tablet dicyclomine (BENTYL) 10 MG capsule fluticasone (FLONASE) 50 MCG/ACT nasal spray furosemide (LASIX) 40 MG tablet levothyroxine (EUTHYROX) 50 MCG tablet lisinopril (ZESTRIL) 5 MG tablet metoprolol succinate (TOPROL-XL) 25 MG 24 hr tablet omega-3 acid ethyl esters (LOVAZA) 1 g capsule omeprazole (PRILOSEC) 40 MG capsule ondansetron (ZOFRAN) 4 MG tablet potassium chloride (KLOR-CON) 10 MEQ tablet pramipexole (MIRAPEX) 0.125 MG tablet sucralfate (CARAFATE) 1 g tablet Patient self administered medications include: N/A.  SLEEP ARCHITECTURE Patient was studied for 611.2 minutes. The sleep efficiency was 100.0 % and the patient was supine for 0%. The arousal index was 0.0 per hour.  RESPIRATORY PARAMETERS The overall AHI was 18.1 per hour, with a central apnea index of 0.0 per hour with absent supine sleep.  The oxygen nadir was 76% during sleep. Time spent < 89% was 341.3 minutes.  CARDIAC DATA Mean heart rate during sleep was 52.7 bpm.  IMPRESSIONS - Moderate obstructive sleep apnea (AHI 18.1/h). However, the overall severity may be underestimated since supine sleep was  absent on this study and the severity during REM sleep cannot be assessed on this home study. - No significant central sleep apnea occurred during this study (CAI 0.0/h). - Severe oxygen desaturation to a nadir of 76%. - Patient snored 0.2% during the sleep.  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  - Effort should be made to opimize nasal and oropharyngeal patency. - Avoid alcohol, sedatives and other CNS depressants that may worsen sleep apnea and disrupt normal sleep architecture. - Sleep hygiene should be reviewed to assess factors that may improve sleep quality. - Weight management and regular exercise should be initiated or continued. - Recommend a download and sleep clinic evaluation after initiation of CPAP therapy.   [Electronically signed] 04/22/2020 11:32 AM  Shelva Majestic MD, Tomasa Hose Diplomate, American Board of Sleep Medicine   NPI: [id number] [place] PH: [phone]   FX: [phone] [place]

## 2020-04-23 ENCOUNTER — Other Ambulatory Visit: Payer: Medicare HMO

## 2020-04-24 ENCOUNTER — Telehealth: Payer: Self-pay | Admitting: *Deleted

## 2020-04-24 ENCOUNTER — Encounter: Payer: Self-pay | Admitting: Family Medicine

## 2020-04-24 DIAGNOSIS — G4733 Obstructive sleep apnea (adult) (pediatric): Secondary | ICD-10-CM

## 2020-04-24 NOTE — Telephone Encounter (Signed)
-----   Message from Troy Sine, MD sent at 04/22/2020 11:37 AM EST ----- [name], please notify pt and set up for CPAP titration study

## 2020-04-24 NOTE — Telephone Encounter (Signed)
Informed patient of sleep study results and patient understanding was verbalized. Patient understands her sleep study showed  IMPRESSIONS - Moderate obstructive sleep apnea (AHI 18.1/h). However, the overall severity may be underestimated since supine sleep was absent on this study and the severity during REM sleep cannot be assessed on this home study. - Severe oxygen desaturation to a nadir of 76%. - Patient snored 0.2% during the sleep.  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   Titration sent to sleep pool

## 2020-04-29 ENCOUNTER — Other Ambulatory Visit: Payer: Self-pay | Admitting: *Deleted

## 2020-04-29 ENCOUNTER — Other Ambulatory Visit (INDEPENDENT_AMBULATORY_CARE_PROVIDER_SITE_OTHER): Payer: Medicare HMO

## 2020-04-29 ENCOUNTER — Other Ambulatory Visit: Payer: Self-pay

## 2020-04-29 DIAGNOSIS — N289 Disorder of kidney and ureter, unspecified: Secondary | ICD-10-CM

## 2020-04-29 DIAGNOSIS — D649 Anemia, unspecified: Secondary | ICD-10-CM

## 2020-04-29 LAB — BASIC METABOLIC PANEL
BUN: 25 mg/dL — ABNORMAL HIGH (ref 6–23)
CO2: 27 mEq/L (ref 19–32)
Calcium: 9 mg/dL (ref 8.4–10.5)
Chloride: 104 mEq/L (ref 96–112)
Creatinine, Ser: 1.19 mg/dL (ref 0.40–1.20)
GFR: 43.82 mL/min — ABNORMAL LOW (ref >60.00)
Glucose, Bld: 104 mg/dL — ABNORMAL HIGH (ref 70–99)
Potassium: 3.8 mEq/L (ref 3.5–5.1)
Sodium: 140 mEq/L (ref 135–145)

## 2020-04-29 LAB — CBC
HCT: 33.4 % — ABNORMAL LOW (ref 36.0–46.0)
Hemoglobin: 11.1 g/dL — ABNORMAL LOW (ref 12.0–15.0)
MCHC: 33.2 g/dL (ref 30.0–36.0)
MCV: 90.4 fl (ref 78.0–100.0)
Platelets: 206 10*3/uL (ref 150.0–400.0)
RBC: 3.7 Mil/uL — ABNORMAL LOW (ref 3.87–5.11)
RDW: 14.4 % (ref 11.5–15.5)
WBC: 4.3 10*3/uL (ref 4.0–10.5)

## 2020-04-30 ENCOUNTER — Telehealth: Payer: Self-pay | Admitting: Cardiovascular Disease

## 2020-04-30 DIAGNOSIS — H25011 Cortical age-related cataract, right eye: Secondary | ICD-10-CM | POA: Diagnosis not present

## 2020-04-30 DIAGNOSIS — H43811 Vitreous degeneration, right eye: Secondary | ICD-10-CM | POA: Diagnosis not present

## 2020-04-30 DIAGNOSIS — H2511 Age-related nuclear cataract, right eye: Secondary | ICD-10-CM | POA: Diagnosis not present

## 2020-04-30 DIAGNOSIS — H35411 Lattice degeneration of retina, right eye: Secondary | ICD-10-CM | POA: Diagnosis not present

## 2020-04-30 DIAGNOSIS — H53002 Unspecified amblyopia, left eye: Secondary | ICD-10-CM | POA: Diagnosis not present

## 2020-04-30 DIAGNOSIS — H35372 Puckering of macula, left eye: Secondary | ICD-10-CM | POA: Diagnosis not present

## 2020-04-30 DIAGNOSIS — Z961 Presence of intraocular lens: Secondary | ICD-10-CM | POA: Diagnosis not present

## 2020-04-30 DIAGNOSIS — H04123 Dry eye syndrome of bilateral lacrimal glands: Secondary | ICD-10-CM | POA: Diagnosis not present

## 2020-04-30 DIAGNOSIS — Z9889 Other specified postprocedural states: Secondary | ICD-10-CM | POA: Diagnosis not present

## 2020-04-30 NOTE — Telephone Encounter (Signed)
Auth submitted through [employer], should hear back in 48 hours - LCN

## 2020-05-02 MED ORDER — FENTANYL CITRATE (PF) 100 MCG/2ML IJ SOLN
INTRAMUSCULAR | Status: AC
  Filled 2020-05-02: qty 2

## 2020-05-02 MED ORDER — DEXAMETHASONE SODIUM PHOSPHATE 10 MG/ML IJ SOLN
INTRAMUSCULAR | Status: AC
  Filled 2020-05-02: qty 1

## 2020-05-02 MED ORDER — PROPOFOL 10 MG/ML IV BOLUS
INTRAVENOUS | Status: AC
  Filled 2020-05-02: qty 20

## 2020-05-02 MED ORDER — ONDANSETRON HCL 4 MG/2ML IJ SOLN
INTRAMUSCULAR | Status: AC
  Filled 2020-05-02: qty 2

## 2020-05-04 IMAGING — CT CT HEAD W/O CM
3 series · 14 of 45 positions shown, 16 images · non-contrast
Comparison: Head CT dated 02/15/2019

CLINICAL DATA: 78-year-old female with acute headache.

EXAM:
CT HEAD WITHOUT CONTRAST
TECHNIQUE: Contiguous axial images were obtained from the base of the skull
through the vertex without intravenous contrast.

[Series 2: head wo · axial · 0.39mm/px · z∈[+838,+954]mm · 8 of 28 slices shown, 10 images]
[im 3/28  brain]
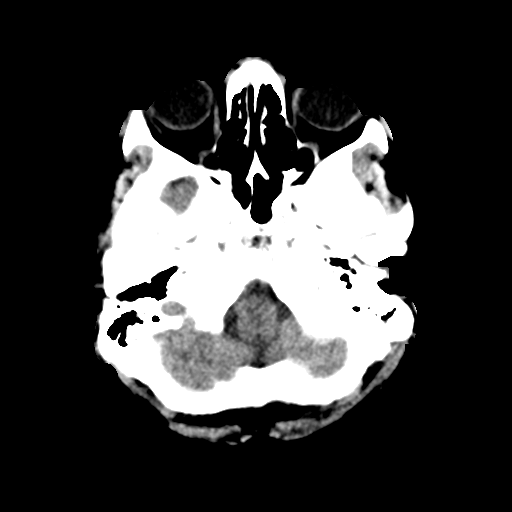
[im 3/28  bone]
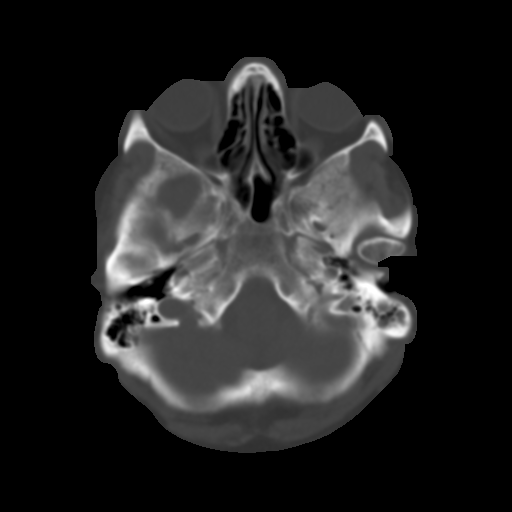
[im 6/28  brain]
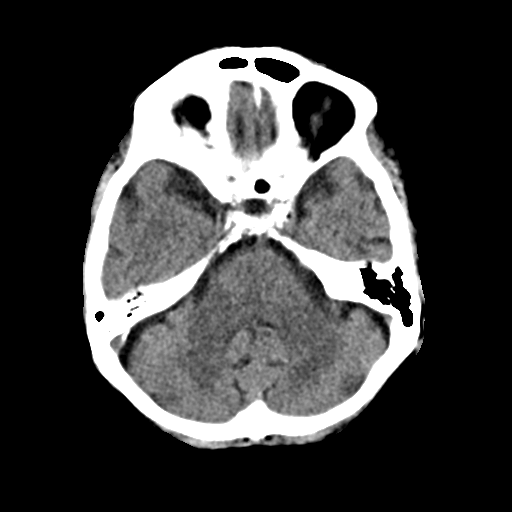
[im 10/28  brain]
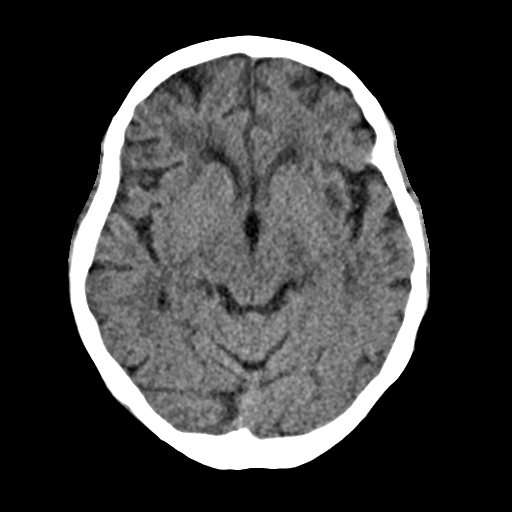
[im 13/28  brain]
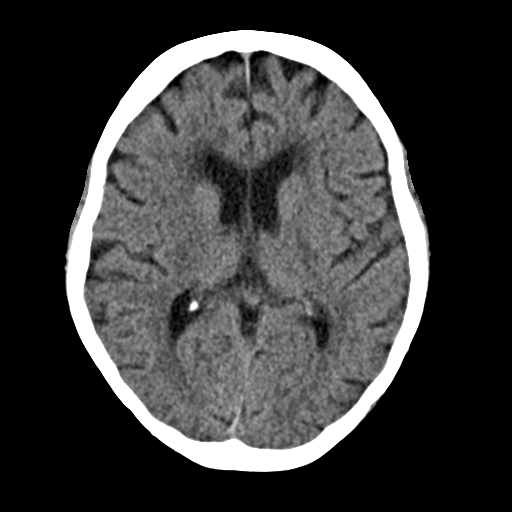
[im 16/28  brain]
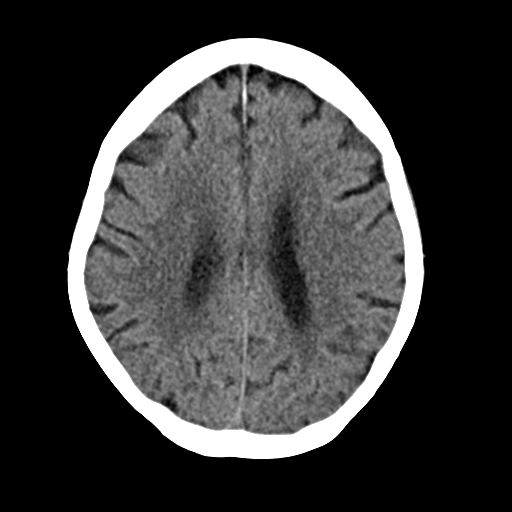
[im 16/28  bone]
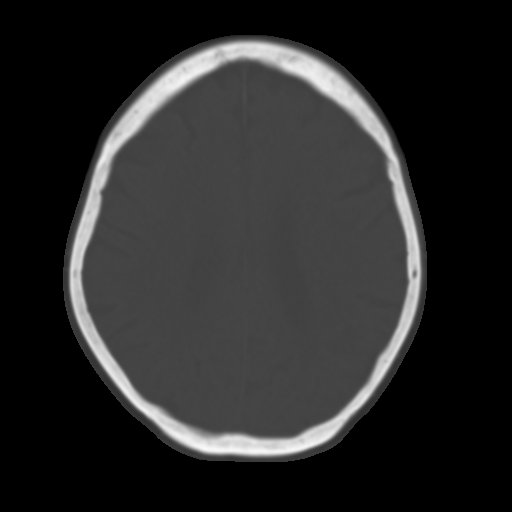
[im 19/28  brain]
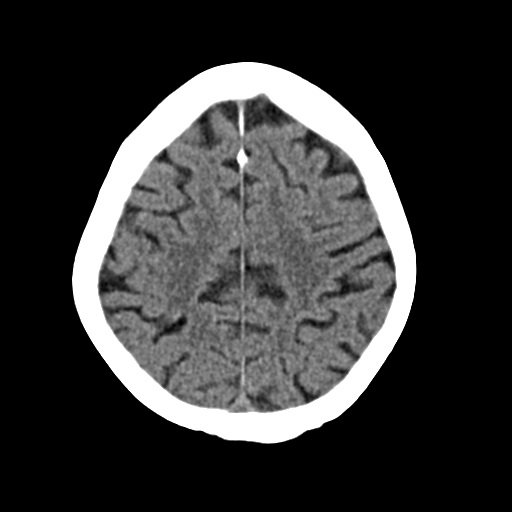
[im 23/28  brain]
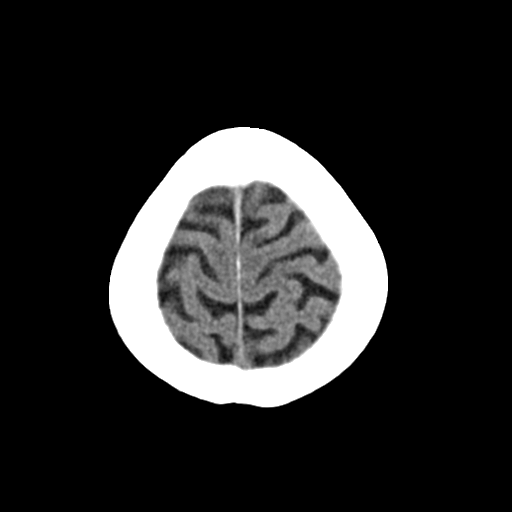
[im 26/28  brain]
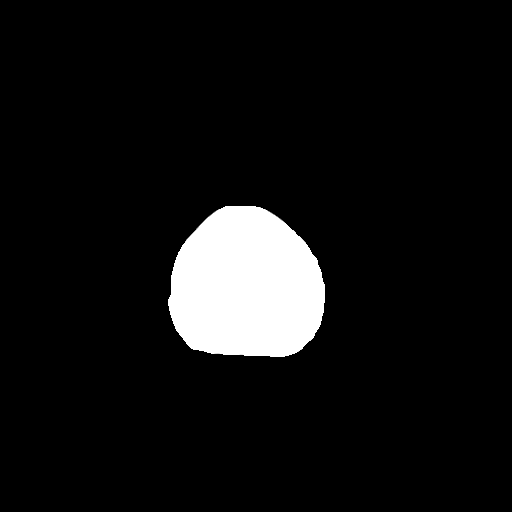

[Series 4: coronal soft · coronal · 0.28mm/px · 3 of 67 slices shown]
[im 23/67  brain]
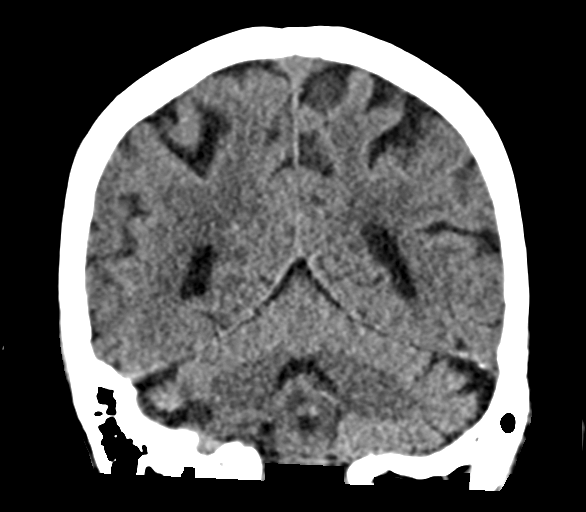
[im 30/67  brain]
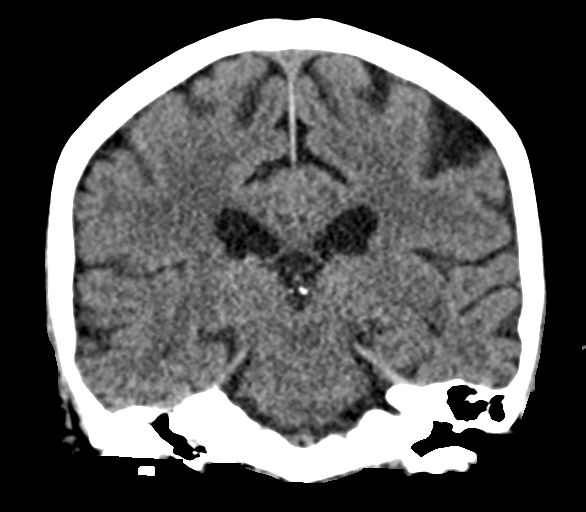
[im 37/67  brain]
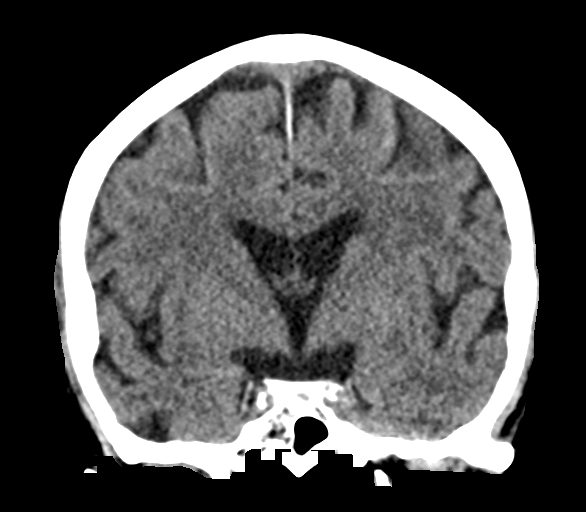

[Series 5: sag soft · sagittal · 0.27mm/px · 3 of 55 slices shown]
[im 19/55  brain]
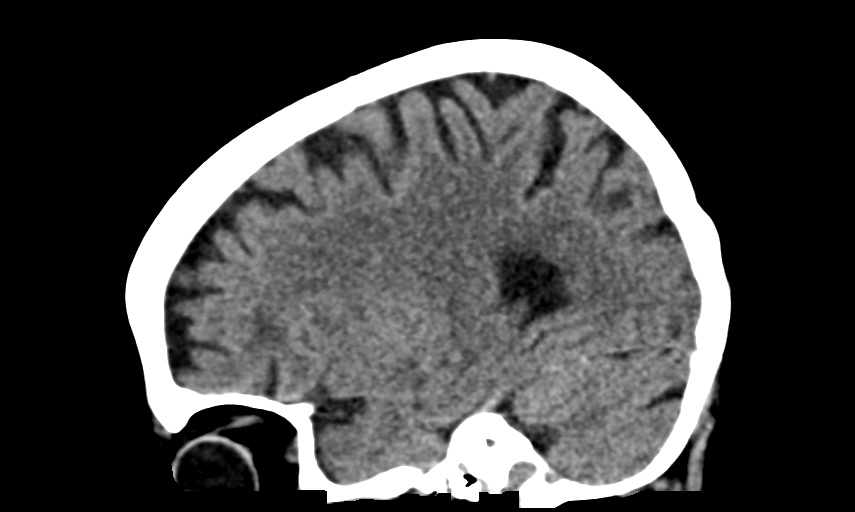
[im 28/55  brain]
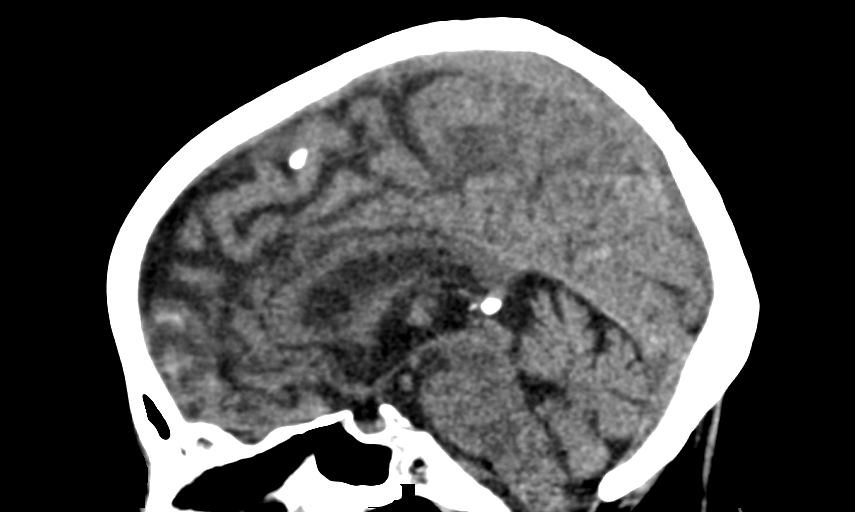
[im 37/55  brain]
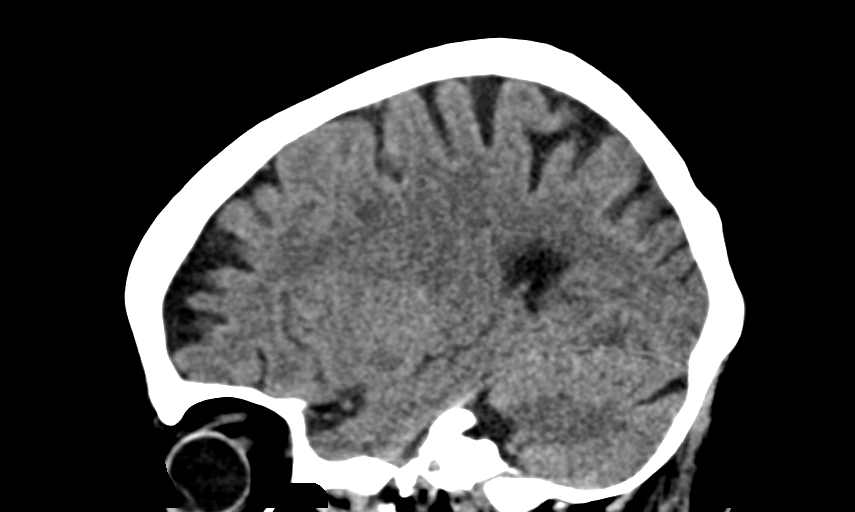

[14 of 45 positions shown; findings below may reference images not displayed]

FINDINGS: Brain: Mild age-related atrophy and chronic microvascular ischemic
changes. There is no acute intracranial hemorrhage. No mass effect
or midline shift. No extra-axial fluid collection.

Vascular: No hyperdense vessel or unexpected calcification.

Skull: Normal. Negative for fracture or focal lesion.

Sinuses/Orbits: No acute finding.

Other: None
IMPRESSION: 1. No acute intracranial hemorrhage.
2. Mild age-related atrophy and chronic microvascular ischemic
changes.

## 2020-05-05 ENCOUNTER — Ambulatory Visit: Payer: Medicare HMO | Admitting: Family Medicine

## 2020-05-06 NOTE — Telephone Encounter (Signed)
APPROVED, AUTH # [id number]; Valid until 08/03/2020

## 2020-05-07 DIAGNOSIS — H524 Presbyopia: Secondary | ICD-10-CM | POA: Diagnosis not present

## 2020-05-07 DIAGNOSIS — H52223 Regular astigmatism, bilateral: Secondary | ICD-10-CM | POA: Diagnosis not present

## 2020-05-08 ENCOUNTER — Ambulatory Visit: Payer: Medicare HMO | Admitting: Family Medicine

## 2020-05-08 ENCOUNTER — Encounter: Payer: Self-pay | Admitting: Family Medicine

## 2020-05-12 ENCOUNTER — Other Ambulatory Visit: Payer: Self-pay | Admitting: Cardiology

## 2020-05-12 DIAGNOSIS — R519 Headache, unspecified: Secondary | ICD-10-CM | POA: Diagnosis not present

## 2020-05-12 DIAGNOSIS — J029 Acute pharyngitis, unspecified: Secondary | ICD-10-CM | POA: Diagnosis not present

## 2020-05-12 DIAGNOSIS — Z20822 Contact with and (suspected) exposure to covid-19: Secondary | ICD-10-CM | POA: Diagnosis not present

## 2020-05-12 NOTE — Telephone Encounter (Signed)
Rx refill sent to pharmacy. 

## 2020-05-14 NOTE — Telephone Encounter (Signed)
Pt is scheduled for 05/29/20 at 11:20am

## 2020-05-21 ENCOUNTER — Other Ambulatory Visit: Payer: Self-pay | Admitting: Family Medicine

## 2020-05-21 DIAGNOSIS — Z1231 Encounter for screening mammogram for malignant neoplasm of breast: Secondary | ICD-10-CM

## 2020-05-28 ENCOUNTER — Ambulatory Visit: Payer: Medicare HMO | Attending: Cardiovascular Disease | Admitting: Cardiovascular Disease

## 2020-05-28 ENCOUNTER — Other Ambulatory Visit: Payer: Self-pay

## 2020-05-28 ENCOUNTER — Emergency Department (HOSPITAL_COMMUNITY): Admission: EM | Admit: 2020-05-28 | Discharge: 2020-05-28 | Discharge status: Absent without leave | Payer: Medicare HMO

## 2020-05-28 DIAGNOSIS — G4733 Obstructive sleep apnea (adult) (pediatric): Secondary | ICD-10-CM | POA: Insufficient documentation

## 2020-05-29 ENCOUNTER — Ambulatory Visit (INDEPENDENT_AMBULATORY_CARE_PROVIDER_SITE_OTHER): Payer: Medicare HMO | Admitting: Family Medicine

## 2020-05-29 ENCOUNTER — Encounter: Payer: Self-pay | Admitting: Family Medicine

## 2020-05-29 ENCOUNTER — Ambulatory Visit (HOSPITAL_BASED_OUTPATIENT_CLINIC_OR_DEPARTMENT_OTHER)
Admission: RE | Admit: 2020-05-29 | Discharge: 2020-05-29 | Disposition: A | Discharge status: Home / Self Care | Payer: Medicare HMO | Source: Ambulatory Visit | Attending: Family Medicine | Admitting: Family Medicine

## 2020-05-29 DIAGNOSIS — M545 Low back pain, unspecified: Secondary | ICD-10-CM | POA: Diagnosis not present

## 2020-05-29 DIAGNOSIS — M79641 Pain in right hand: Secondary | ICD-10-CM | POA: Diagnosis not present

## 2020-05-29 DIAGNOSIS — Z23 Encounter for immunization: Secondary | ICD-10-CM

## 2020-05-29 DIAGNOSIS — R739 Hyperglycemia, unspecified: Secondary | ICD-10-CM

## 2020-05-29 DIAGNOSIS — M19041 Primary osteoarthritis, right hand: Secondary | ICD-10-CM | POA: Diagnosis not present

## 2020-05-29 DIAGNOSIS — E559 Vitamin D deficiency, unspecified: Secondary | ICD-10-CM | POA: Diagnosis not present

## 2020-05-29 DIAGNOSIS — M79645 Pain in left finger(s): Secondary | ICD-10-CM | POA: Diagnosis not present

## 2020-05-29 DIAGNOSIS — R531 Weakness: Secondary | ICD-10-CM | POA: Diagnosis not present

## 2020-05-29 DIAGNOSIS — E039 Hypothyroidism, unspecified: Secondary | ICD-10-CM

## 2020-05-29 DIAGNOSIS — I10 Essential (primary) hypertension: Secondary | ICD-10-CM | POA: Diagnosis not present

## 2020-05-29 DIAGNOSIS — M19042 Primary osteoarthritis, left hand: Secondary | ICD-10-CM | POA: Diagnosis not present

## 2020-05-29 DIAGNOSIS — M79642 Pain in left hand: Secondary | ICD-10-CM | POA: Insufficient documentation

## 2020-05-29 DIAGNOSIS — E785 Hyperlipidemia, unspecified: Secondary | ICD-10-CM

## 2020-05-29 DIAGNOSIS — M79605 Pain in left leg: Secondary | ICD-10-CM

## 2020-05-29 DIAGNOSIS — K589 Irritable bowel syndrome without diarrhea: Secondary | ICD-10-CM

## 2020-05-29 DIAGNOSIS — F419 Anxiety disorder, unspecified: Secondary | ICD-10-CM

## 2020-05-29 DIAGNOSIS — M810 Age-related osteoporosis without current pathological fracture: Secondary | ICD-10-CM

## 2020-05-29 LAB — CBC WITH DIFFERENTIAL/PLATELET
Basophils Absolute: 0 10*3/uL (ref 0.0–0.1)
Basophils Relative: 0.7 % (ref 0.0–3.0)
Eosinophils Absolute: 0 10*3/uL (ref 0.0–0.7)
Eosinophils Relative: 0 % (ref 0.0–5.0)
HCT: 33.6 % — ABNORMAL LOW (ref 36.0–46.0)
Hemoglobin: 11.2 g/dL — ABNORMAL LOW (ref 12.0–15.0)
Lymphocytes Relative: 35.2 % (ref 12.0–46.0)
Lymphs Abs: 1.6 10*3/uL (ref 0.7–4.0)
MCHC: 33.5 g/dL (ref 30.0–36.0)
MCV: 89.3 fl (ref 78.0–100.0)
Monocytes Absolute: 0.7 10*3/uL (ref 0.1–1.0)
Monocytes Relative: 14.6 % — ABNORMAL HIGH (ref 3.0–12.0)
Neutro Abs: 2.3 10*3/uL (ref 1.4–7.7)
Neutrophils Relative %: 49.5 % (ref 43.0–77.0)
Platelets: 235 10*3/uL (ref 150.0–400.0)
RBC: 3.76 Mil/uL — ABNORMAL LOW (ref 3.87–5.11)
RDW: 14.3 % (ref 11.5–15.5)
WBC: 4.6 10*3/uL (ref 4.0–10.5)

## 2020-05-29 LAB — LIPID PANEL
Cholesterol: 160 mg/dL (ref 0–200)
HDL: 65.9 mg/dL (ref >39.00)
LDL Cholesterol: 73 mg/dL (ref 0–99)
NonHDL: 94.22
Total CHOL/HDL Ratio: 2
Triglycerides: 105 mg/dL (ref 0.0–149.0)
VLDL: 21 mg/dL (ref 0.0–40.0)

## 2020-05-29 LAB — COMPREHENSIVE METABOLIC PANEL
ALT: 14 U/L (ref 0–35)
AST: 22 U/L (ref 0–37)
Albumin: 4.7 g/dL (ref 3.5–5.2)
Alkaline Phosphatase: 75 U/L (ref 39–117)
BUN: 22 mg/dL (ref 6–23)
CO2: 30 mEq/L (ref 19–32)
Calcium: 9.5 mg/dL (ref 8.4–10.5)
Chloride: 101 mEq/L (ref 96–112)
Creatinine, Ser: 1.2 mg/dL (ref 0.40–1.20)
GFR: 43.35 mL/min — ABNORMAL LOW (ref >60.00)
Glucose, Bld: 89 mg/dL (ref 70–99)
Potassium: 4.2 mEq/L (ref 3.5–5.1)
Sodium: 139 mEq/L (ref 135–145)
Total Bilirubin: 0.8 mg/dL (ref 0.2–1.2)
Total Protein: 7.4 g/dL (ref 6.0–8.3)

## 2020-05-29 LAB — HEMOGLOBIN A1C: Hgb A1c MFr Bld: 5.8 % (ref 4.6–6.5)

## 2020-05-29 LAB — TSH: TSH: 3.05 u[IU]/mL (ref 0.35–4.50)

## 2020-05-29 LAB — VITAMIN D 25 HYDROXY (VIT D DEFICIENCY, FRACTURES): VITD: 75.52 ng/mL (ref 30.00–100.00)

## 2020-05-29 NOTE — Patient Instructions (Signed)

## 2020-06-01 DIAGNOSIS — M79641 Pain in right hand: Secondary | ICD-10-CM | POA: Insufficient documentation

## 2020-06-01 NOTE — Assessment & Plan Note (Signed)
On Levothyroxine, continue to monitor 

## 2020-06-01 NOTE — Assessment & Plan Note (Signed)
Improved with abdominal pain improvement.

## 2020-06-01 NOTE — Assessment & Plan Note (Signed)
Worsening and worst at left thenar prominence. xrays today confirm definitive arthritic changes of note. She is referred to a hand surgeon for consideration. For now keep active and use topical treatments

## 2020-06-01 NOTE — Assessment & Plan Note (Signed)
Encouraged heart healthy diet, increase exercise, avoid trans fats, consider a krill oil cap daily. Tolerating Atorvastatin 

## 2020-06-01 NOTE — Assessment & Plan Note (Signed)
Encouraged to get adequate exercise, calcium and vitamin d intake 

## 2020-06-01 NOTE — Assessment & Plan Note (Signed)
She is feeling much better her abdominal pain is greatly improved as is her diarrhea. meds are helpful prn. No changes

## 2020-06-01 NOTE — Progress Notes (Signed)
Subjective:    Patient ID: Julie Wilkerson, female    DOB: Mar 09, 1941, 80 y.o.   MRN: [id number]  Chief Complaint  Patient presents with  . Follow-up    Pt states that she has pain in both hand especially the left hand.    HPI Patient is in today for follow up on chronic medical concerns. No recent febrile illness or hospitalizations.  Her abdominal symptoms are greatly improved.  Her diarrhea has calmed down and her abdominal pain is much less frequent and intense.  When she needs hyoscyamine as needed she gets good relief.  She reports her biggest concern today is of weakness and increasing pain in her hands.  Most notably her left thumb is painful stiff and weak but she does have trouble with both hands.  She does not endorse redness or warmth although there is sometimes some swelling. Denies CP/palp/SOB/HA/congestion/fevers/GI or GU c/o. Taking meds as prescribed  Past Medical History:  Diagnosis Date  . Abdominal pain 11/07/2016  . Accelerating angina ([place]) 02/09/2019  . Acute diastolic CHF (congestive heart failure) ([place]) 02/16/2019  . Acute on chronic diastolic CHF (congestive heart failure) ([place]) 02/23/2019  . Acute pansinusitis 04/26/2017  . Amblyopia of eye, left 04/19/2018  . Anemia   . Anginal pain ([place])   . Anxiety   . Arthritis   . Asthma   . Atypical chest pain 02/04/2019  . Bilateral carotid bruits 03/28/2017  . Blood transfusion without reported diagnosis   . Breast cancer ([place])    2002, left, encapsulated, microcalcifications. lumpectomy, radtiation x 30  . Breast cancer in female [facility])   . Bursitis of left hip 09/04/2018  . Change in bowel habits 02/22/2018  . Change in mole 06/08/2016  . Chest pain 03/04/2020  . CHF (congestive heart failure) ([place])   . 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 Chronic diastolic congestive heart failure ([place]) 05/28/2019  . Colitis   . Complication of anesthesia    VERY SENSITIVE TO ANESTHESIA   . Constipation 01/17/2018   . Coronary artery disease involving native coronary artery of native heart without angina pectoris 05/28/2019  . Cortical age-related cataract of right eye 04/18/2018  . Cough variant asthma  vs UACS from ACEi     Spirometry 12/02/2016  FEV1 1.26 (76%)  Ratio 78 with min curvature p last saba > 6 h prior  - trial off acei 12/02/2016  - 12/02/2016  After extensive coaching HFA effectiveness =    75% with qvar autohaler > rechallenge with 80 2bid    . Decreased visual acuity 01/16/2017  . Degeneration of lumbar intervertebral disc 08/01/2018  . Diarrhea 11/16/2015   D/o Diverticuli, polyps, celiac disease.    Marland Kitchen Dyspnea 11/04/2016   with asthma exacerbation only  . Dysuria 11/04/2016  . Elevated sed rate 10/23/2017  . Essential hypertension    Trial off acei 12/02/2016 due to cough/ pseudoasthma  . Gluten intolerance   . History of chicken pox   . History of Helicobacter pylori infection 08/01/2015  . History of rectal bleeding 06/01/2019  . Hx of LASIK 04/24/2019  . Hyperglycemia 11/07/2016  . Hyperlipidemia   . Hypertension   . Hyponatremia 02/16/2019  . Hypothyroidism   . IBS (irritable bowel syndrome)   . 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 Left hand pain 05/07/2018  . Left hip pain 03/01/2017  . Left leg pain 03/01/2017  . Left shoulder pain 05/07/2018  . Low back pain 08/10/2015  .  LUQ pain 06/01/2019  . 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 Neck pain 03/01/2017  . Nuclear sclerotic cataract of right eye 04/18/2018  . Osteoporosis   . Overweight (BMI 25.0-29.9) 02/14/2019  . Pain in joint of left shoulder 08/11/2018  . Paroxysmal atrial tachycardia ([place]) 05/28/2019  . Pelvic pain 07/02/2018  . Personal history of radiation therapy   . Pneumonia   . PONV (postoperative nausea and vomiting)   . Poor appetite 02/22/2018  . Pseudophakia of left eye 04/18/2018  . PVD (posterior vitreous detachment), right 04/24/2019  . Restless sleeper  11/07/2016  . Right hip pain 10/23/2017  . RLQ discomfort 03/01/2017  . RLS (restless legs syndrome) 05/07/2019  . 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 Sepsis due to pneumonia ([place]) 02/16/2019  . Stomach cramps   . Tremor 02/15/2016  . Urinary frequency 12/22/2017  . Urinary incontinence 05/07/2019  . Urinary tract infection 11/04/2016  . Vitamin D deficiency 04/29/2016    Past Surgical History:  Procedure Laterality Date  . ABDOMINAL HYSTERECTOMY     partial  . APPENDECTOMY    . BREAST LUMPECTOMY Left 2002  . CHOLECYSTECTOMY  Age 73 or 54  . COLONOSCOPY  2017  . EYE SURGERY Left    cataract  . LEFT HEART CATH AND CORONARY ANGIOGRAPHY N/A 02/09/2019   Procedure: LEFT HEART CATH AND CORONARY ANGIOGRAPHY;  Surgeon: Nelva Bush, MD;  Location: [facility];  Service: Cardiovascular;  Laterality: N/A;  . TONSILLECTOMY    . 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 Colitis Mother   . Irritable bowel syndrome Mother   . Hypertension Father   . Hyperlipidemia Brother   . Heart attack Brother   . Stomach cancer Paternal Grandmother 31  . Colon cancer Paternal Grandmother   . Hyperlipidemia Brother   . Heart attack Brother   . Hyperlipidemia Brother   . Heart attack Brother   . Hyperlipidemia Sister   . Leukemia Daughter   . Arthritis Daughter   . Heart disease Daughter        ASD vs VSD  . Esophageal cancer Neg Hx     Social History   Socioeconomic History  . Marital status: Single    Spouse name: Not on file  . Number of children: 3  . Years of education: 26  . Highest education level: Master's degree (e.g., MA, MS, MEng, MEd, MSW, MBA)  Occupational History  . Occupation: retired    Comment: Pharmacist, hospital, kindergarten  Tobacco Use  . Smoking status: Never Smoker  .  Smokeless tobacco: Never Used  Vaping Use  . Vaping Use: Never used  Substance and Sexual Activity  . Alcohol use: Never    Alcohol/week: 0.0 standard drinks  . Drug use: Never  . Sexual activity: Not Currently    Birth control/protection: None    Comment: lives alone, avoids daiiry and gluten. 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 Not on file  Social History Narrative  . Not on file   Social Determinants of Health   Financial Resource Strain: Not on file  Food Insecurity: Not on file  Transportation Needs: Not on file  Physical Activity: Not on file  Stress: Not on file  Social Connections:  Not on file  Intimate Partner Violence: Not on file    Outpatient Medications Prior to Visit  Medication Sig Dispense Refill  . albuterol (VENTOLIN HFA) 108 (90 Base) MCG/ACT inhaler Inhale 2 puffs into the lungs every 6 (six) hours as needed for wheezing or shortness of breath. 18 g 2  . ASPIRIN 81 PO 81 mg daily.     Marland Kitchen atorvastatin (LIPITOR) 20 MG tablet Take 1 tablet by mouth once daily 90 tablet 0  . citalopram (CELEXA) 20 MG tablet Take 1 tablet (20 mg total) by mouth at bedtime. 90 tablet 0  . dicyclomine (BENTYL) 10 MG capsule Take 1 capsule (10 mg total) by mouth 2 (two) times daily. 180 capsule 1  . fluticasone (FLONASE) 50 MCG/ACT nasal spray Place 2 sprays into both nostrils daily as needed for allergies or rhinitis. 16 g 5  . furosemide (LASIX) 40 MG tablet Take 1 tablet by mouth once daily 90 tablet 0  . levothyroxine (EUTHYROX) 50 MCG tablet Take 1 tablet (50 mcg total) by mouth daily. 90 tablet 0  . lisinopril (ZESTRIL) 5 MG tablet Take 1 tablet by mouth twice daily (Patient taking differently: Take 5 mg by mouth daily.) 180 tablet 1  . metoprolol succinate (TOPROL-XL) 25 MG 24 hr tablet TAKE 1 TABLET BY MOUTH AT BEDTIME 90 tablet 1  . omega-3 acid ethyl esters (LOVAZA) 1 g capsule Take 2 g by mouth daily.    Marland Kitchen omeprazole (PRILOSEC) 40 MG capsule Take 1 capsule  (40 mg total) by mouth in the morning and at bedtime. Take 1 tablet by mouth once to twice daily 60 capsule 5  . ondansetron (ZOFRAN) 4 MG tablet Dissolve 1 tablet under tongue once to twice a day, every day 90 tablet 3  . potassium chloride (KLOR-CON) 10 MEQ tablet Take 1 tablet by mouth once daily 90 tablet 1  . pramipexole (MIRAPEX) 0.125 MG tablet Take 1 tablet (0.125 mg total) by mouth 2 (two) times daily. 60 tablet 1  . sucralfate (CARAFATE) 1 g tablet Take 1 tablet (1 g total) by mouth 2 (two) times daily. 60 tablet 0   No facility-administered medications prior to visit.    No Known Allergies  Review of Systems  Constitutional: Positive for malaise/fatigue. Negative for fever.  HENT: Negative for congestion.   Eyes: Negative for blurred vision.  Respiratory: Negative for shortness of breath.   Cardiovascular: Negative for chest pain, palpitations and leg swelling.  Gastrointestinal: Positive for abdominal pain. Negative for blood in stool and nausea.  Genitourinary: Negative for dysuria and frequency.  Musculoskeletal: Positive for joint pain. Negative for falls.  Skin: Negative for rash.  Neurological: Negative for dizziness, loss of consciousness and headaches.  Endo/Heme/Allergies: Negative for environmental allergies.  Psychiatric/Behavioral: Negative for depression. The patient is not nervous/anxious.        Objective:    Physical Exam Vitals and nursing note reviewed.  Constitutional:      General: She is not in acute distress.    Appearance: She is well-developed and well-nourished.  HENT:     Head: Normocephalic and atraumatic.     Nose: Nose normal.  Eyes:     General:        Right eye: No discharge.        Left eye: No discharge.  Cardiovascular:     Rate and Rhythm: Normal rate and regular rhythm.     Heart sounds: No murmur heard.   Pulmonary:     Effort:  Pulmonary effort is normal.     Breath sounds: Normal breath sounds.  Abdominal:     General:  Bowel sounds are normal.     Palpations: Abdomen is soft.     Tenderness: There is no abdominal tenderness.  Musculoskeletal:        General: No edema.     Cervical back: Normal range of motion and neck supple.  Skin:    General: Skin is warm and dry.  Neurological:     Mental Status: She is alert and oriented to person, place, and time.  Psychiatric:        Mood and Affect: Mood and affect normal.     There were no vitals taken for this visit. Wt Readings from Last 3 Encounters:  04/22/20 120 lb 4 oz (54.5 kg)  04/15/20 120 lb (54.4 kg)  04/02/20 117 lb (53.1 kg)    Diabetic Foot Exam - Simple   No data filed    Lab Results  Component Value Date   WBC 4.6 05/29/2020   HGB 11.2 (L) 05/29/2020   HCT 33.6 (L) 05/29/2020   PLT 235.0 05/29/2020   GLUCOSE 89 05/29/2020   CHOL 160 05/29/2020   TRIG 105.0 05/29/2020   HDL 65.90 05/29/2020   LDLCALC 73 05/29/2020   ALT 14 05/29/2020   AST 22 05/29/2020   NA 139 05/29/2020   K 4.2 05/29/2020   CL 101 05/29/2020   CREATININE 1.20 05/29/2020   BUN 22 05/29/2020   CO2 30 05/29/2020   TSH 3.05 05/29/2020   INR 1.2 02/16/2019   HGBA1C 5.8 05/29/2020    Lab Results  Component Value Date   TSH 3.05 05/29/2020   Lab Results  Component Value Date   WBC 4.6 05/29/2020   HGB 11.2 (L) 05/29/2020   HCT 33.6 (L) 05/29/2020   MCV 89.3 05/29/2020   PLT 235.0 05/29/2020   Lab Results  Component Value Date   NA 139 05/29/2020   K 4.2 05/29/2020   CO2 30 05/29/2020   GLUCOSE 89 05/29/2020   BUN 22 05/29/2020   CREATININE 1.20 05/29/2020   BILITOT 0.8 05/29/2020   ALKPHOS 75 05/29/2020   AST 22 05/29/2020   ALT 14 05/29/2020   PROT 7.4 05/29/2020   ALBUMIN 4.7 05/29/2020   CALCIUM 9.5 05/29/2020   ANIONGAP 11 04/02/2020   GFR 43.35 (L) 05/29/2020   Lab Results  Component Value Date   CHOL 160 05/29/2020   Lab Results  Component Value Date   HDL 65.90 05/29/2020   Lab Results  Component Value Date   LDLCALC  73 05/29/2020   Lab Results  Component Value Date   TRIG 105.0 05/29/2020   Lab Results  Component Value Date   CHOLHDL 2 05/29/2020   Lab Results  Component Value Date   HGBA1C 5.8 05/29/2020       Assessment & Plan:   Problem List Items Addressed This Visit    Essential hypertension (Chronic)    Well controlled, no changes to meds. Encouraged heart healthy diet such as the DASH diet and exercise as tolerated.       Hyperlipidemia    Encouraged heart healthy diet, increase exercise, avoid trans fats, consider a krill oil cap daily. Tolerating Atorvastatin      Relevant Orders   Lipid panel (Completed)   Osteoporosis    Encouraged to get adequate exercise, calcium and vitamin d intake      Hypothyroid    On Levothyroxine, continue to  monitor      Relevant Orders   TSH (Completed)   IBS (irritable bowel syndrome)    She is feeling much better her abdominal pain is greatly improved as is her diarrhea. meds are helpful prn. No changes      Low back pain   Relevant Orders   CBC with Differential/Platelet (Completed)   Vitamin D deficiency   Relevant Orders   Comprehensive metabolic panel (Completed)   VITAMIN D 25 Hydroxy (Vit-D Deficiency, Fractures) (Completed)   Hyperglycemia   Relevant Orders   Hemoglobin A1c (Completed)   Left leg pain   Anxiety    Improved with abdominal pain improvement.       Bilateral hand pain - Primary    Worsening and worst at left thenar prominence. xrays today confirm definitive arthritic changes of note. She is referred to a hand surgeon for consideration. For now keep active and use topical treatments      Relevant Orders   Ambulatory referral to Hand Surgery   DG Hand Complete Left (Completed)   DG Hand Complete Right (Completed)    Other Visit Diagnoses    Thumb pain, left       Relevant Orders   Ambulatory referral to Hand Surgery   Need for influenza vaccination       Relevant Orders   Flu Vaccine QUAD High  Dose(Fluad) (Completed)      I am having Van Clines. Zahniser maintain her omega-3 acid ethyl esters, albuterol, ASPIRIN 81 PO, pramipexole, fluticasone, lisinopril, sucralfate, omeprazole, metoprolol succinate, levothyroxine, furosemide, potassium chloride, citalopram, dicyclomine, ondansetron, and atorvastatin.  No orders of the defined types were placed in this encounter.    Penni Homans, MD

## 2020-06-01 NOTE — Assessment & Plan Note (Signed)
Well controlled, no changes to meds. Encouraged heart healthy diet such as the DASH diet and exercise as tolerated.  °

## 2020-06-09 ENCOUNTER — Other Ambulatory Visit: Payer: Self-pay

## 2020-06-09 DIAGNOSIS — J189 Pneumonia, unspecified organism: Secondary | ICD-10-CM | POA: Insufficient documentation

## 2020-06-09 DIAGNOSIS — Z5189 Encounter for other specified aftercare: Secondary | ICD-10-CM | POA: Insufficient documentation

## 2020-06-09 DIAGNOSIS — R109 Unspecified abdominal pain: Secondary | ICD-10-CM | POA: Insufficient documentation

## 2020-06-11 ENCOUNTER — Ambulatory Visit: Payer: Medicare HMO | Admitting: Cardiology

## 2020-06-13 ENCOUNTER — Encounter: Payer: Self-pay | Admitting: Cardiovascular Disease

## 2020-06-13 NOTE — Procedures (Signed)
[facility]        Patient Name: Julie Wilkerson, Julie Wilkerson Date: 05/28/2020 Gender: Female D.O.B: 29-Jul-1940 Age (years): 19 Referring Provider: Godfrey Pick Tobb DO Height (inches): 58 Interpreting Physician: Shelva Majestic MD, ABSM Weight (lbs): 120 RPSGT: Peak, Robert BMI: 25 MRN: [id number] Neck Size: 12.00  CLINICAL INFORMATION The patient is referred for a CPAP titration to treat sleep apnea.  Date of HST: 04/15/2020: AHI 18.1/h; O 2 nadir 76%.  SLEEP STUDY TECHNIQUE As per the AASM Manual for the Scoring of Sleep and Associated Events v2.3 (April 2016) with a hypopnea requiring 4% desaturations.  The channels recorded and monitored were frontal, central and occipital EEG, electrooculogram (EOG), submentalis EMG (chin), nasal and oral airflow, thoracic and abdominal wall motion, anterior tibialis EMG, snore microphone, electrocardiogram, and pulse oximetry. Continuous positive airway pressure (CPAP) was initiated at the beginning of the study and titrated to treat sleep-disordered breathing.  MEDICATIONS albuterol (VENTOLIN HFA) 108 (90 Base) MCG/ACT inhaler ASPIRIN 81 PO atorvastatin (LIPITOR) 20 MG tablet citalopram (CELEXA) 20 MG tablet dicyclomine (BENTYL) 10 MG capsule erythromycin ophthalmic ointment fluticasone (FLONASE) 50 MCG/ACT nasal spray furosemide (LASIX) 40 MG tablet leflunomide (ARAVA) 20 MG tablet levothyroxine (EUTHYROX) 50 MCG tablet lisinopril (ZESTRIL) 5 MG tablet meloxicam (MOBIC) 15 MG tablet metoprolol succinate (TOPROL-XL) 25 MG 24 hr tablet omega-3 acid ethyl esters (LOVAZA) 1 g capsule omeprazole (PRILOSEC) 40 MG capsule ondansetron (ZOFRAN) 4 MG tablet potassium chloride (KLOR-CON) 10 MEQ tablet pramipexole (MIRAPEX) 0.125 MG tablet sucralfate (CARAFATE) 1 g tablet  Medications self-administered by patient taken the night of the study : N/A  TECHNICIAN COMMENTS Comments added by technician: Patient was adherent to CPAP pressure  and mask. Comments added by scorer: N/A  RESPIRATORY PARAMETERS Optimal PAP Pressure (cm): 9 AHI at Optimal Pressure (/hr): N/A Overall Minimal O2 (%): 84.00 Supine % at Optimal Pressure (%): N/A Minimal O2 at Optimal Pressure (%): 84.00   SLEEP ARCHITECTURE The study was initiated at 10:05:37 PM and ended at 5:12:48 AM.  Sleep onset time was 45.4 minutes and the sleep efficiency was 58.1%. The total sleep time was 248 minutes.  The patient spent 16.53% of the night in stage N1 sleep, 83.27% in stage N2 sleep, 0.20% in stage N3 and 0% in REM.Stage REM latency was N/A minutes  Wake after sleep onset was 133.8. Alpha intrusion was absent. Supine sleep was 98.79%.  CARDIAC DATA The 2 lead EKG demonstrated sinus rhythm. The mean heart rate was 80.25 beats per minute. Other EKG findings include: None.  LEG MOVEMENT DATA The total Periodic Limb Movements of Sleep (PLMS) were 0. The PLMS index was 0.00. A PLMS index of <15 is considered normal in adults.  IMPRESSIONS - CPAP was initated at 6 cm and was titrated to 9 cm of water. AHI at 9 cm was 4.1 and O2 nadir 86%. - Central sleep apnea was not noted during this titration (CAI = 1.2/h). - Moderate oxygen desaturations to a nadir of 84% at 7 cm of water. - No snoring was audible during this study. - No cardiac abnormalities were observed during this study. - Clinically significant periodic limb movements were not noted during this study. Arousals associated with PLMs were rare.  DIAGNOSIS - Obstructive Sleep Apnea (G47.33)  RECOMMENDATIONS - Recommend an initial trial of CPAP Auto therapy at 8 - 14 cm H2O with heated humidification. A Small size Philips Respironics Nasal Mask DreamWear mask was used for  the titration.  - Effort should be made to optimize nasal and oropharyngeal patency. - Avoid alcohol, sedatives and other CNS depressants that may worsen sleep apnea and disrupt normal sleep architecture. - Sleep hygiene should be  reviewed to assess factors that may improve sleep quality. - Weight management and regular exercise should be initiated or continued. - Recommend a download in 30 days and sleep clinicevaluation after 4 weeks of therapy.   [Electronically signed] 06/13/2020 05:36 PM  Shelva Majestic MD, [facility], [place], American Board of Sleep Medicine   NPI: [id number] [place] PH: [phone]   FX: [phone] [place]

## 2020-06-16 ENCOUNTER — Other Ambulatory Visit: Payer: Self-pay | Admitting: Family Medicine

## 2020-06-17 ENCOUNTER — Encounter: Payer: Self-pay | Admitting: Family Medicine

## 2020-06-18 ENCOUNTER — Telehealth: Payer: Self-pay | Admitting: Cardiovascular Disease

## 2020-06-18 NOTE — Telephone Encounter (Signed)
Spoke with someone from from Dr. Claiborne Billings office and they stated that would send a message to Dr. Claiborne Billings and they will give [name] a call back shortly.

## 2020-06-18 NOTE — Telephone Encounter (Signed)
Lurline Idol with [employer] is requesting to speak with Dr. Evette Georges nurse to discuss his next steps for the patient. She states she would also like to discuss the sleep study order.  Phone #: [phone]

## 2020-06-18 NOTE — Telephone Encounter (Signed)
Attempted to call back but there was no answer.

## 2020-06-19 DIAGNOSIS — G8929 Other chronic pain: Secondary | ICD-10-CM | POA: Diagnosis not present

## 2020-06-19 DIAGNOSIS — Z008 Encounter for other general examination: Secondary | ICD-10-CM | POA: Diagnosis not present

## 2020-06-19 DIAGNOSIS — R69 Illness, unspecified: Secondary | ICD-10-CM | POA: Diagnosis not present

## 2020-06-19 DIAGNOSIS — E785 Hyperlipidemia, unspecified: Secondary | ICD-10-CM | POA: Diagnosis not present

## 2020-06-19 DIAGNOSIS — K219 Gastro-esophageal reflux disease without esophagitis: Secondary | ICD-10-CM | POA: Diagnosis not present

## 2020-06-19 DIAGNOSIS — I2510 Atherosclerotic heart disease of native coronary artery without angina pectoris: Secondary | ICD-10-CM | POA: Diagnosis not present

## 2020-06-19 DIAGNOSIS — I509 Heart failure, unspecified: Secondary | ICD-10-CM | POA: Diagnosis not present

## 2020-06-19 DIAGNOSIS — I110 Hypertensive heart disease with heart failure: Secondary | ICD-10-CM | POA: Diagnosis not present

## 2020-06-19 DIAGNOSIS — E039 Hypothyroidism, unspecified: Secondary | ICD-10-CM | POA: Diagnosis not present

## 2020-06-19 DIAGNOSIS — J45909 Unspecified asthma, uncomplicated: Secondary | ICD-10-CM | POA: Diagnosis not present

## 2020-06-19 NOTE — Telephone Encounter (Signed)
-----   Message from Troy Sine, MD sent at 06/13/2020  5:41 PM EST ----- [place], please notify pt and set up with DME for CPAP initiation.

## 2020-06-19 NOTE — Telephone Encounter (Signed)
Patient notified that her sleep study has been completed and CPAP machine ordered, however there is a backorder on the machines. Order has been sent to [facility]. Patient also informed that due to the backorder it can take up to 4-5 months for her to receive her machine. If she notices any change in her medical condition she was told to inform her MD. Patient voiced understanding of what has been told to her.

## 2020-06-19 NOTE — Telephone Encounter (Signed)
I called Dr. Evette Georges office and they informed me that the CPAP is on back order and it take 4-5 months before she could receive her machine. Pt is to call them if she has any problems or concerns. They also wrote a note in [place].

## 2020-06-20 NOTE — Telephone Encounter (Signed)
Pt is aware of below.

## 2020-06-23 ENCOUNTER — Encounter (HOSPITAL_BASED_OUTPATIENT_CLINIC_OR_DEPARTMENT_OTHER): Payer: Medicare HMO | Admitting: Cardiovascular Disease

## 2020-06-24 ENCOUNTER — Ambulatory Visit (INDEPENDENT_AMBULATORY_CARE_PROVIDER_SITE_OTHER): Payer: Medicare HMO | Admitting: Family Medicine

## 2020-06-24 ENCOUNTER — Other Ambulatory Visit: Payer: Self-pay

## 2020-06-24 VITALS — BP 122/64 | HR 58 | Temp 98.3°F | Resp 16 | Wt 120.2 lb

## 2020-06-24 DIAGNOSIS — R109 Unspecified abdominal pain: Secondary | ICD-10-CM | POA: Diagnosis not present

## 2020-06-24 DIAGNOSIS — M549 Dorsalgia, unspecified: Secondary | ICD-10-CM | POA: Insufficient documentation

## 2020-06-24 DIAGNOSIS — N390 Urinary tract infection, site not specified: Secondary | ICD-10-CM

## 2020-06-24 DIAGNOSIS — G8929 Other chronic pain: Secondary | ICD-10-CM

## 2020-06-24 DIAGNOSIS — I10 Essential (primary) hypertension: Secondary | ICD-10-CM | POA: Diagnosis not present

## 2020-06-24 DIAGNOSIS — I5032 Chronic diastolic (congestive) heart failure: Secondary | ICD-10-CM | POA: Diagnosis not present

## 2020-06-24 DIAGNOSIS — M79641 Pain in right hand: Secondary | ICD-10-CM

## 2020-06-24 DIAGNOSIS — R102 Pelvic and perineal pain: Secondary | ICD-10-CM | POA: Diagnosis not present

## 2020-06-24 DIAGNOSIS — M546 Pain in thoracic spine: Secondary | ICD-10-CM

## 2020-06-24 DIAGNOSIS — M79642 Pain in left hand: Secondary | ICD-10-CM

## 2020-06-24 DIAGNOSIS — G4730 Sleep apnea, unspecified: Secondary | ICD-10-CM

## 2020-06-24 NOTE — Progress Notes (Signed)
Subjective:    Patient ID: Julie Wilkerson, female    DOB: 1940-08-05, 80 y.o.   MRN: [id number]  Chief Complaint  Patient presents with  . Follow-up    HPI Patient is in today for follow up on chronic medical concerns. She continues to have daily pain and debility in her hands, left>right. She is unable to close her fist on the left. Has not heard from ortho about the referral. Also notes her sleep study was positive but now she has to wait for equipment which is on back order. She notes what she believes to be excessive fatigue so she would like to find a different pulmonologist to see if they can get a machine faster. She also would like to switch her cardiology provider. Her lower abdominal pain is better but can still recur. Had pain and then fecal incontinence recently. Also some urinary incontinence and dysuria continue to recur. Denies CP/palp/SOB/HA/congestion/fevers. Taking meds as prescribed  Past Medical History:  Diagnosis Date  . Abdominal pain 11/07/2016  . Accelerating angina ([place]) 02/09/2019  . Acute diastolic CHF (congestive heart failure) ([place]) 02/16/2019  . Acute on chronic diastolic CHF (congestive heart failure) ([place]) 02/23/2019  . Acute pansinusitis 04/26/2017  . Amblyopia of eye, left 04/19/2018  . Anemia   . Anginal pain ([place])   . Anxiety   . Arthritis   . Asthma   . Atypical chest pain 02/04/2019  . Bilateral carotid bruits 03/28/2017  . Blood transfusion without reported diagnosis   . Breast cancer ([place])    2002, left, encapsulated, microcalcifications. lumpectomy, radtiation x 30  . Breast cancer in female [facility])   . Bursitis of left hip 09/04/2018  . Change in bowel habits 02/22/2018  . Change in mole 06/08/2016  . Chest pain 03/04/2020  . CHF (congestive heart failure) ([place])   . 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 Chronic diastolic congestive heart failure ([place]) 05/28/2019  . Colitis   . Complication of anesthesia    VERY SENSITIVE TO  ANESTHESIA   . Constipation 01/17/2018  . Coronary artery disease involving native coronary artery of native heart without angina pectoris 05/28/2019  . Cortical age-related cataract of right eye 04/18/2018  . Cough variant asthma  vs UACS from ACEi     Spirometry 12/02/2016  FEV1 1.26 (76%)  Ratio 78 with min curvature p last saba > 6 h prior  - trial off acei 12/02/2016  - 12/02/2016  After extensive coaching HFA effectiveness =    75% with qvar autohaler > rechallenge with 80 2bid    . Decreased visual acuity 01/16/2017  . Degeneration of lumbar intervertebral disc 08/01/2018  . Diarrhea 11/16/2015   D/o Diverticuli, polyps, celiac disease.    Marland Kitchen Dyspnea 11/04/2016   with asthma exacerbation only  . Dysuria 11/04/2016  . Elevated sed rate 10/23/2017  . Essential hypertension    Trial off acei 12/02/2016 due to cough/ pseudoasthma  . Gluten intolerance   . History of chicken pox   . History of Helicobacter pylori infection 08/01/2015  . History of rectal bleeding 06/01/2019  . Hx of LASIK 04/24/2019  . Hyperglycemia 11/07/2016  . Hyperlipidemia   . Hypertension   . Hyponatremia 02/16/2019  . Hypothyroidism   . IBS (irritable bowel syndrome)   . 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 Left hand pain 05/07/2018  . Left hip pain 03/01/2017  . Left leg pain 03/01/2017  . Left shoulder pain 05/07/2018  . Low  back pain 08/10/2015  . LUQ pain 06/01/2019  . 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 Neck pain 03/01/2017  . Nuclear sclerotic cataract of right eye 04/18/2018  . Osteoporosis   . Overweight (BMI 25.0-29.9) 02/14/2019  . Pain in joint of left shoulder 08/11/2018  . Paroxysmal atrial tachycardia ([place]) 05/28/2019  . Pelvic pain 07/02/2018  . Personal history of radiation therapy   . Pneumonia   . PONV (postoperative nausea and vomiting)   . Poor appetite 02/22/2018  . Pseudophakia of left eye 04/18/2018  . PVD (posterior vitreous detachment),  right 04/24/2019  . 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 Right hip pain 10/23/2017  . RLQ discomfort 03/01/2017  . RLS (restless legs syndrome) 05/07/2019  . 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 Sepsis due to pneumonia ([place]) 02/16/2019  . Stomach cramps   . Tremor 02/15/2016  . Urinary frequency 12/22/2017  . Urinary incontinence 05/07/2019  . Urinary tract infection 11/04/2016  . Vitamin D deficiency 04/29/2016    Past Surgical History:  Procedure Laterality Date  . ABDOMINAL HYSTERECTOMY     partial  . APPENDECTOMY    . BREAST LUMPECTOMY Left 2002  . CHOLECYSTECTOMY  Age 20 or 87  . COLONOSCOPY  2017  . EYE SURGERY Left    cataract  . LEFT HEART CATH AND CORONARY ANGIOGRAPHY N/A 02/09/2019   Procedure: LEFT HEART CATH AND CORONARY ANGIOGRAPHY;  Surgeon: Nelva Bush, MD;  Location: [facility];  Service: Cardiovascular;  Laterality: N/A;  . TONSILLECTOMY    . 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 Colitis Mother   . Irritable bowel syndrome Mother   . Hypertension Father   . Hyperlipidemia Brother   . Heart attack Brother   . Stomach cancer Paternal Grandmother 70  . Colon cancer Paternal Grandmother   . Hyperlipidemia Brother   . Heart attack Brother   . Hyperlipidemia Brother   . Heart attack Brother   . Hyperlipidemia Sister   . Leukemia Daughter   . Arthritis Daughter   . Heart disease Daughter        ASD vs VSD  . Esophageal cancer Neg Hx     Social History   Socioeconomic History  . Marital status: Single    Spouse name: Not on file  . Number of children: 3  . Years of education: 12  . Highest education level: Master's degree (e.g., MA, MS, MEng, MEd, MSW, MBA)  Occupational History  . Occupation: retired    Comment: Pharmacist, hospital, kindergarten  Tobacco Use  .  Smoking status: Never Smoker  . Smokeless tobacco: Never Used  Vaping Use  . Vaping Use: Never used  Substance and Sexual Activity  . Alcohol use: Never    Alcohol/week: 0.0 standard drinks  . Drug use: Never  . Sexual activity: Not Currently    Birth control/protection: None    Comment: lives alone, avoids daiiry and gluten. 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 Not on file  Social History Narrative  . Not on file   Social Determinants of Health   Financial Resource Strain: Not on file  Food Insecurity: Not on file  Transportation Needs: Not on file  Physical Activity: Not on file  Stress: Not  on file  Social Connections: Not on file  Intimate Partner Violence: Not on file    Outpatient Medications Prior to Visit  Medication Sig Dispense Refill  . albuterol (VENTOLIN HFA) 108 (90 Base) MCG/ACT inhaler Inhale 2 puffs into the lungs every 6 (six) hours as needed for wheezing or shortness of breath. 18 g 2  . ASPIRIN 81 PO 81 mg daily.     Marland Kitchen atorvastatin (LIPITOR) 20 MG tablet Take 1 tablet by mouth once daily 90 tablet 0  . citalopram (CELEXA) 20 MG tablet Take 1 tablet (20 mg total) by mouth at bedtime. 90 tablet 0  . dicyclomine (BENTYL) 10 MG capsule Take 1 capsule (10 mg total) by mouth 2 (two) times daily. 180 capsule 1  . fluticasone (FLONASE) 50 MCG/ACT nasal spray Place 2 sprays into both nostrils daily as needed for allergies or rhinitis. 16 g 5  . furosemide (LASIX) 40 MG tablet Take 1 tablet by mouth once daily 90 tablet 0  . levothyroxine (SYNTHROID) 50 MCG tablet Take 1 tablet by mouth once daily 90 tablet 0  . lisinopril (ZESTRIL) 5 MG tablet Take 1 tablet by mouth twice daily (Patient taking differently: Take 5 mg by mouth daily.) 180 tablet 1  . meloxicam (MOBIC) 15 MG tablet Take 15 mg by mouth daily.    . metoprolol succinate (TOPROL-XL) 25 MG 24 hr tablet TAKE 1 TABLET BY MOUTH AT BEDTIME 90 tablet 1  . omega-3 acid ethyl esters (LOVAZA) 1 g  capsule Take 2 g by mouth daily.    Marland Kitchen omeprazole (PRILOSEC) 40 MG capsule Take 1 capsule (40 mg total) by mouth in the morning and at bedtime. Take 1 tablet by mouth once to twice daily 60 capsule 5  . ondansetron (ZOFRAN) 4 MG tablet Dissolve 1 tablet under tongue once to twice a day, every day 90 tablet 3  . potassium chloride (KLOR-CON) 10 MEQ tablet Take 1 tablet by mouth once daily 90 tablet 1  . pramipexole (MIRAPEX) 0.125 MG tablet Take 1 tablet (0.125 mg total) by mouth 2 (two) times daily. 60 tablet 1  . sucralfate (CARAFATE) 1 g tablet Take 1 tablet (1 g total) by mouth 2 (two) times daily. 60 tablet 0  . erythromycin ophthalmic ointment Place into the left eye 3 (three) times daily. (Patient not taking: Reported on 06/24/2020)    . leflunomide (ARAVA) 20 MG tablet Take 20 mg by mouth daily. (Patient not taking: Reported on 06/24/2020)     No facility-administered medications prior to visit.    No Known Allergies  Review of Systems  Constitutional: Positive for malaise/fatigue. Negative for fever.  HENT: Negative for congestion.   Eyes: Negative for blurred vision.  Respiratory: Negative for shortness of breath.   Cardiovascular: Negative for chest pain, palpitations and leg swelling.  Gastrointestinal: Positive for abdominal pain and constipation. Negative for blood in stool, melena and nausea.  Genitourinary: Negative for dysuria and frequency.  Musculoskeletal: Positive for back pain, joint pain, myalgias and neck pain. Negative for falls.  Skin: Negative for rash.  Neurological: Negative for dizziness, loss of consciousness and headaches.  Endo/Heme/Allergies: Negative for environmental allergies.  Psychiatric/Behavioral: Negative for depression. The patient is not nervous/anxious.        Objective:    Physical Exam  BP 122/64 (BP Location: Left Arm, Patient Position: Sitting, Cuff Size: Small)   Pulse (!) 58   Temp 98.3 F (36.8 C) (Oral)   Resp 16   Wt 120 lb  4 oz  (54.5 kg)   SpO2 98%   BMI 26.25 kg/m  Wt Readings from Last 3 Encounters:  06/24/20 120 lb 4 oz (54.5 kg)  04/22/20 120 lb 4 oz (54.5 kg)  04/15/20 120 lb (54.4 kg)    Diabetic Foot Exam - Simple   No data filed    Lab Results  Component Value Date   WBC 4.6 05/29/2020   HGB 11.2 (L) 05/29/2020   HCT 33.6 (L) 05/29/2020   PLT 235.0 05/29/2020   GLUCOSE 89 05/29/2020   CHOL 160 05/29/2020   TRIG 105.0 05/29/2020   HDL 65.90 05/29/2020   LDLCALC 73 05/29/2020   ALT 14 05/29/2020   AST 22 05/29/2020   NA 139 05/29/2020   K 4.2 05/29/2020   CL 101 05/29/2020   CREATININE 1.20 05/29/2020   BUN 22 05/29/2020   CO2 30 05/29/2020   TSH 3.05 05/29/2020   INR 1.2 02/16/2019   HGBA1C 5.8 05/29/2020    Lab Results  Component Value Date   TSH 3.05 05/29/2020   Lab Results  Component Value Date   WBC 4.6 05/29/2020   HGB 11.2 (L) 05/29/2020   HCT 33.6 (L) 05/29/2020   MCV 89.3 05/29/2020   PLT 235.0 05/29/2020   Lab Results  Component Value Date   NA 139 05/29/2020   K 4.2 05/29/2020   CO2 30 05/29/2020   GLUCOSE 89 05/29/2020   BUN 22 05/29/2020   CREATININE 1.20 05/29/2020   BILITOT 0.8 05/29/2020   ALKPHOS 75 05/29/2020   AST 22 05/29/2020   ALT 14 05/29/2020   PROT 7.4 05/29/2020   ALBUMIN 4.7 05/29/2020   CALCIUM 9.5 05/29/2020   ANIONGAP 11 04/02/2020   GFR 43.35 (L) 05/29/2020   Lab Results  Component Value Date   CHOL 160 05/29/2020   Lab Results  Component Value Date   HDL 65.90 05/29/2020   Lab Results  Component Value Date   LDLCALC 73 05/29/2020   Lab Results  Component Value Date   TRIG 105.0 05/29/2020   Lab Results  Component Value Date   CHOLHDL 2 05/29/2020   Lab Results  Component Value Date   HGBA1C 5.8 05/29/2020       Assessment & Plan:   Problem List Items Addressed This Visit    Pelvic pain   Relevant Orders   Ambulatory referral to Urogynecology   CHF (congestive heart failure) ([place])    Asymptomatic no  recent flares, no changes,is awaiting appointment with cardiology      Hypertension    Well controlled, no changes to meds. Encouraged heart healthy diet such as the DASH diet and exercise as tolerated.       Bilateral hand pain    Patient referred to a hand specialist at [facility] but have not heard anything yet. Will check on referral      Stomach cramps    Has noted some increase in lower abdominal cramping again and can be associated with BM or urination. She notes dysuria and urinary frequency referred to urogynecology for further consideration.       Back pain, chronic    Mid back pain is chronic but has been worsening and radiates around left flank to the front. She has an appt with ortho later this month. If she stretches out she is some better. She has scheduled an appt with Dr Nelva Bush to consider an injection which has helped in the past. No recent fall or trauma.  Sleep apnea - Primary    Testing with [facility] confirmed sleep apnea but due to ordering restraints it may be several months until she gets her machine. She is very tired each morning and would like to investigate other systems so see if she can get the machine earlier. Referral placed      Relevant Orders   Ambulatory referral to Pulmonology    Other Visit Diagnoses    Recurrent UTI       Relevant Orders   Ambulatory referral to Urogynecology      I am having [place]. Halle maintain her omega-3 acid ethyl esters, albuterol, ASPIRIN 81 PO, pramipexole, fluticasone, lisinopril, sucralfate, omeprazole, metoprolol succinate, furosemide, potassium chloride, citalopram, dicyclomine, ondansetron, atorvastatin, erythromycin, leflunomide, meloxicam, and levothyroxine.  No orders of the defined types were placed in this encounter.    Penni Homans, MD

## 2020-06-24 NOTE — Assessment & Plan Note (Signed)
Mid back pain is chronic but has been worsening and radiates around left flank to the front. She has an appt with ortho later this month. If she stretches out she is some better. She has scheduled an appt with Dr Nelva Bush to consider an injection which has helped in the past. No recent fall or trauma.

## 2020-06-24 NOTE — Patient Instructions (Signed)
Arthritis Arthritis is a term that is commonly used to refer to joint pain or joint disease. There are more than 100 types of arthritis. What are the causes? The most common cause of this condition is wear and tear of a joint. Other causes include:  Gout.  Inflammation of a joint.  An infection of a joint.  Sprains and other injuries near the joint.  A reaction to medicines or drugs, or an allergic reaction. In some cases, the cause may not be known. What are the signs or symptoms? The main symptom of this condition is pain in the joint during movement. Other symptoms include:  Redness, swelling, or stiffness at a joint.  Warmth coming from the joint.  Fever.  Overall feeling of illness. How is this diagnosed? This condition may be diagnosed with a physical exam and tests, including:  Blood tests.  Urine tests.  Imaging tests, such as X-rays, an MRI, or a CT scan. Sometimes, fluid is removed from a joint for testing. How is this treated? This condition may be treated with:  Treatment of the cause, if it is known.  Rest.  Raising (elevating) the joint.  Applying cold or hot packs to the joint.  Medicines to improve symptoms and reduce inflammation.  Injections of a steroid such as cortisone into the joint to help reduce pain and inflammation. Depending on the cause of your arthritis, you may need to make lifestyle changes to reduce stress on your joint. Changes may include:  Exercising more.  Losing weight. Follow these instructions at home: Medicines  Take over-the-counter and prescription medicines only as told by your health care provider.  Do not take aspirin to relieve pain if your health care provider thinks that gout may be causing your pain. Activity  Rest your joint if told by your health care provider. Rest is important when your disease is active and your joint feels painful, swollen, or stiff.  Avoid activities that make the pain worse. It is  important to balance activity with rest.  Exercise your joint regularly with range-of-motion exercises as told by your health care provider. Try doing low-impact exercise, such as: ? Swimming. ? Water aerobics. ? Biking. ? Walking. Managing pain, stiffness, and swelling  If directed, put ice on the joint. ? Put ice in a plastic bag. ? Place a towel between your skin and the bag. ? Leave the ice on for 20 minutes, 2-3 times per day.  If your joint is swollen, raise (elevate) it above the level of your heart if directed by your health care provider.  If your joint feels stiff in the morning, try taking a warm shower.  If directed, apply heat to the affected area as often as told by your health care provider. Use the heat source that your health care provider recommends, such as a moist heat pack or a heating pad. If you have diabetes, do not apply heat without permission from your health care provider. To apply heat: ? Place a towel between your skin and the heat source. ? Leave the heat on for 20-30 minutes. ? Remove the heat if your skin turns bright red. This is especially important if you are unable to feel pain, heat, or cold. You may have a greater risk of getting burned.      General instructions  Do not use any products that contain nicotine or tobacco, such as cigarettes, e-cigarettes, and chewing tobacco. If you need help quitting, ask your health care provider.    Keep all follow-up visits as told by your health care provider. This is important. Contact a health care provider if:  The pain gets worse.  You have a fever. Get help right away if:  You develop severe joint pain, swelling, or redness.  Many joints become painful and swollen.  You develop severe back pain.  You develop severe weakness in your leg.  You cannot control your bladder or bowels. Summary  Arthritis is a term that is commonly used to refer to joint pain or joint disease. There are more than  100 types of arthritis.  The most common cause of this condition is wear and tear of a joint. Other causes include gout, inflammation or infection of the joint, sprains, or allergies.  Symptoms of this condition include redness, swelling, or stiffness of the joint. Other symptoms include warmth, fever, or feeling ill.  This condition is treated with rest, elevation, medicines, and applying cold or hot packs.  Follow your health care provider's instructions about medicines, activity, exercises, and other home care treatments. This information is not intended to replace advice given to you by your health care provider. Make sure you discuss any questions you have with your health care provider. Document Revised: 04/17/2018 Document Reviewed: 04/17/2018 Elsevier Patient Education  2021 Elsevier Inc.  

## 2020-06-24 NOTE — Assessment & Plan Note (Signed)
Well controlled, no changes to meds. Encouraged heart healthy diet such as the DASH diet and exercise as tolerated.  °

## 2020-06-24 NOTE — Assessment & Plan Note (Signed)
Testing with [facility] confirmed sleep apnea but due to ordering restraints it may be several months until she gets her machine. She is very tired each morning and would like to investigate other systems so see if she can get the machine earlier. Referral placed

## 2020-06-24 NOTE — Assessment & Plan Note (Signed)
Patient referred to a hand specialist at [facility] but have not heard anything yet. Will check on referral

## 2020-06-24 NOTE — Assessment & Plan Note (Signed)
Has noted some increase in lower abdominal cramping again and can be associated with BM or urination. She notes dysuria and urinary frequency referred to urogynecology for further consideration.

## 2020-06-24 NOTE — Assessment & Plan Note (Addendum)
Asymptomatic no recent flares, no changes,is awaiting appointment with cardiology

## 2020-06-25 ENCOUNTER — Telehealth: Payer: Self-pay | Admitting: *Deleted

## 2020-06-25 NOTE — Telephone Encounter (Signed)
CPAP order faxed to [facility]. 

## 2020-06-30 ENCOUNTER — Other Ambulatory Visit: Payer: Self-pay

## 2020-06-30 ENCOUNTER — Other Ambulatory Visit: Payer: Self-pay | Admitting: Family Medicine

## 2020-06-30 ENCOUNTER — Ambulatory Visit
Admission: RE | Admit: 2020-06-30 | Discharge: 2020-06-30 | Disposition: A | Discharge status: Home / Self Care | Payer: Medicare HMO | Source: Ambulatory Visit | Attending: Family Medicine | Admitting: Family Medicine

## 2020-06-30 DIAGNOSIS — Z1231 Encounter for screening mammogram for malignant neoplasm of breast: Secondary | ICD-10-CM

## 2020-06-30 DIAGNOSIS — N63 Unspecified lump in unspecified breast: Secondary | ICD-10-CM

## 2020-06-30 DIAGNOSIS — N644 Mastodynia: Secondary | ICD-10-CM

## 2020-07-01 ENCOUNTER — Other Ambulatory Visit: Payer: Self-pay | Admitting: Family Medicine

## 2020-07-03 ENCOUNTER — Telehealth: Payer: Self-pay | Admitting: *Deleted

## 2020-07-03 NOTE — Telephone Encounter (Signed)
Patient called to ask how much longer will she have to wait to receive her CPAP machine. She states that she is waking up every morning with a headache. I deferred her to [facility]. I also reminded her per our initial conversation that it may take months due to the back order of the machines.

## 2020-07-07 DIAGNOSIS — M542 Cervicalgia: Secondary | ICD-10-CM | POA: Diagnosis not present

## 2020-07-07 DIAGNOSIS — M5459 Other low back pain: Secondary | ICD-10-CM | POA: Diagnosis not present

## 2020-07-08 ENCOUNTER — Ambulatory Visit
Admission: RE | Admit: 2020-07-08 | Discharge: 2020-07-08 | Disposition: A | Discharge status: Home / Self Care | Payer: Medicare HMO | Source: Ambulatory Visit | Attending: Family Medicine | Admitting: Family Medicine

## 2020-07-08 ENCOUNTER — Ambulatory Visit: Payer: Medicare HMO

## 2020-07-08 ENCOUNTER — Other Ambulatory Visit: Payer: Self-pay

## 2020-07-08 DIAGNOSIS — N63 Unspecified lump in unspecified breast: Secondary | ICD-10-CM

## 2020-07-08 DIAGNOSIS — N644 Mastodynia: Secondary | ICD-10-CM

## 2020-07-09 ENCOUNTER — Ambulatory Visit: Payer: Medicare HMO | Admitting: Cardiology

## 2020-07-10 DIAGNOSIS — M79641 Pain in right hand: Secondary | ICD-10-CM | POA: Diagnosis not present

## 2020-07-10 DIAGNOSIS — M1812 Unilateral primary osteoarthritis of first carpometacarpal joint, left hand: Secondary | ICD-10-CM | POA: Diagnosis not present

## 2020-07-10 DIAGNOSIS — M79642 Pain in left hand: Secondary | ICD-10-CM | POA: Diagnosis not present

## 2020-07-22 DIAGNOSIS — M25631 Stiffness of right wrist, not elsewhere classified: Secondary | ICD-10-CM | POA: Diagnosis not present

## 2020-07-22 DIAGNOSIS — M25632 Stiffness of left wrist, not elsewhere classified: Secondary | ICD-10-CM | POA: Diagnosis not present

## 2020-07-22 DIAGNOSIS — M1812 Unilateral primary osteoarthritis of first carpometacarpal joint, left hand: Secondary | ICD-10-CM | POA: Diagnosis not present

## 2020-07-22 DIAGNOSIS — R531 Weakness: Secondary | ICD-10-CM | POA: Diagnosis not present

## 2020-07-23 DIAGNOSIS — R531 Weakness: Secondary | ICD-10-CM | POA: Diagnosis not present

## 2020-07-23 DIAGNOSIS — M25632 Stiffness of left wrist, not elsewhere classified: Secondary | ICD-10-CM | POA: Diagnosis not present

## 2020-07-23 DIAGNOSIS — M1812 Unilateral primary osteoarthritis of first carpometacarpal joint, left hand: Secondary | ICD-10-CM | POA: Diagnosis not present

## 2020-07-23 DIAGNOSIS — M25631 Stiffness of right wrist, not elsewhere classified: Secondary | ICD-10-CM | POA: Diagnosis not present

## 2020-07-28 DIAGNOSIS — M25631 Stiffness of right wrist, not elsewhere classified: Secondary | ICD-10-CM | POA: Diagnosis not present

## 2020-07-28 DIAGNOSIS — R531 Weakness: Secondary | ICD-10-CM | POA: Diagnosis not present

## 2020-07-28 DIAGNOSIS — M1812 Unilateral primary osteoarthritis of first carpometacarpal joint, left hand: Secondary | ICD-10-CM | POA: Diagnosis not present

## 2020-07-28 DIAGNOSIS — M25632 Stiffness of left wrist, not elsewhere classified: Secondary | ICD-10-CM | POA: Diagnosis not present

## 2020-08-05 DIAGNOSIS — M25631 Stiffness of right wrist, not elsewhere classified: Secondary | ICD-10-CM | POA: Diagnosis not present

## 2020-08-05 DIAGNOSIS — R531 Weakness: Secondary | ICD-10-CM | POA: Diagnosis not present

## 2020-08-05 DIAGNOSIS — M1812 Unilateral primary osteoarthritis of first carpometacarpal joint, left hand: Secondary | ICD-10-CM | POA: Diagnosis not present

## 2020-08-05 DIAGNOSIS — M25632 Stiffness of left wrist, not elsewhere classified: Secondary | ICD-10-CM | POA: Diagnosis not present

## 2020-08-07 DIAGNOSIS — M25631 Stiffness of right wrist, not elsewhere classified: Secondary | ICD-10-CM | POA: Diagnosis not present

## 2020-08-07 DIAGNOSIS — R350 Frequency of micturition: Secondary | ICD-10-CM | POA: Diagnosis not present

## 2020-08-07 DIAGNOSIS — R102 Pelvic and perineal pain: Secondary | ICD-10-CM | POA: Diagnosis not present

## 2020-08-07 DIAGNOSIS — M1812 Unilateral primary osteoarthritis of first carpometacarpal joint, left hand: Secondary | ICD-10-CM | POA: Diagnosis not present

## 2020-08-07 DIAGNOSIS — N952 Postmenopausal atrophic vaginitis: Secondary | ICD-10-CM | POA: Diagnosis not present

## 2020-08-07 DIAGNOSIS — R3915 Urgency of urination: Secondary | ICD-10-CM | POA: Diagnosis not present

## 2020-08-07 DIAGNOSIS — M25632 Stiffness of left wrist, not elsewhere classified: Secondary | ICD-10-CM | POA: Diagnosis not present

## 2020-08-07 DIAGNOSIS — R531 Weakness: Secondary | ICD-10-CM | POA: Diagnosis not present

## 2020-08-07 DIAGNOSIS — N3020 Other chronic cystitis without hematuria: Secondary | ICD-10-CM | POA: Diagnosis not present

## 2020-08-11 DIAGNOSIS — M2569 Stiffness of other specified joint, not elsewhere classified: Secondary | ICD-10-CM | POA: Diagnosis not present

## 2020-08-11 DIAGNOSIS — M545 Low back pain, unspecified: Secondary | ICD-10-CM | POA: Diagnosis not present

## 2020-08-11 DIAGNOSIS — R531 Weakness: Secondary | ICD-10-CM | POA: Diagnosis not present

## 2020-08-11 DIAGNOSIS — M5136 Other intervertebral disc degeneration, lumbar region: Secondary | ICD-10-CM | POA: Diagnosis not present

## 2020-08-12 ENCOUNTER — Other Ambulatory Visit: Payer: Self-pay | Admitting: Urology

## 2020-08-12 DIAGNOSIS — R102 Pelvic and perineal pain: Secondary | ICD-10-CM

## 2020-08-15 DIAGNOSIS — R531 Weakness: Secondary | ICD-10-CM | POA: Diagnosis not present

## 2020-08-15 DIAGNOSIS — M2569 Stiffness of other specified joint, not elsewhere classified: Secondary | ICD-10-CM | POA: Diagnosis not present

## 2020-08-15 DIAGNOSIS — M5136 Other intervertebral disc degeneration, lumbar region: Secondary | ICD-10-CM | POA: Diagnosis not present

## 2020-08-15 DIAGNOSIS — M545 Low back pain, unspecified: Secondary | ICD-10-CM | POA: Diagnosis not present

## 2020-08-16 ENCOUNTER — Other Ambulatory Visit: Payer: Self-pay | Admitting: Cardiology

## 2020-08-16 ENCOUNTER — Other Ambulatory Visit: Payer: Self-pay | Admitting: Family Medicine

## 2020-08-18 DIAGNOSIS — R531 Weakness: Secondary | ICD-10-CM | POA: Diagnosis not present

## 2020-08-18 DIAGNOSIS — M2569 Stiffness of other specified joint, not elsewhere classified: Secondary | ICD-10-CM | POA: Diagnosis not present

## 2020-08-18 DIAGNOSIS — M545 Low back pain, unspecified: Secondary | ICD-10-CM | POA: Diagnosis not present

## 2020-08-18 DIAGNOSIS — M5136 Other intervertebral disc degeneration, lumbar region: Secondary | ICD-10-CM | POA: Diagnosis not present

## 2020-08-18 NOTE — Telephone Encounter (Signed)
Refill sent to pharmacy.   

## 2020-08-20 DIAGNOSIS — M2569 Stiffness of other specified joint, not elsewhere classified: Secondary | ICD-10-CM | POA: Diagnosis not present

## 2020-08-20 DIAGNOSIS — R531 Weakness: Secondary | ICD-10-CM | POA: Diagnosis not present

## 2020-08-20 DIAGNOSIS — M5136 Other intervertebral disc degeneration, lumbar region: Secondary | ICD-10-CM | POA: Diagnosis not present

## 2020-08-20 DIAGNOSIS — M545 Low back pain, unspecified: Secondary | ICD-10-CM | POA: Diagnosis not present

## 2020-08-22 ENCOUNTER — Ambulatory Visit
Admission: RE | Admit: 2020-08-22 | Discharge: 2020-08-22 | Disposition: A | Discharge status: Home / Self Care | Payer: Medicare HMO | Source: Ambulatory Visit | Attending: Urology | Admitting: Urology

## 2020-08-22 DIAGNOSIS — Z90710 Acquired absence of both cervix and uterus: Secondary | ICD-10-CM | POA: Diagnosis not present

## 2020-08-22 DIAGNOSIS — R102 Pelvic and perineal pain: Secondary | ICD-10-CM

## 2020-08-25 DIAGNOSIS — M545 Low back pain, unspecified: Secondary | ICD-10-CM | POA: Diagnosis not present

## 2020-08-25 DIAGNOSIS — M2569 Stiffness of other specified joint, not elsewhere classified: Secondary | ICD-10-CM | POA: Diagnosis not present

## 2020-08-25 DIAGNOSIS — R531 Weakness: Secondary | ICD-10-CM | POA: Diagnosis not present

## 2020-08-25 DIAGNOSIS — M5136 Other intervertebral disc degeneration, lumbar region: Secondary | ICD-10-CM | POA: Diagnosis not present

## 2020-08-27 DIAGNOSIS — M2569 Stiffness of other specified joint, not elsewhere classified: Secondary | ICD-10-CM | POA: Diagnosis not present

## 2020-08-27 DIAGNOSIS — R531 Weakness: Secondary | ICD-10-CM | POA: Diagnosis not present

## 2020-08-27 DIAGNOSIS — M5136 Other intervertebral disc degeneration, lumbar region: Secondary | ICD-10-CM | POA: Diagnosis not present

## 2020-08-27 DIAGNOSIS — M545 Low back pain, unspecified: Secondary | ICD-10-CM | POA: Diagnosis not present

## 2020-09-03 ENCOUNTER — Encounter: Payer: Self-pay | Admitting: Family Medicine

## 2020-09-03 DIAGNOSIS — M2569 Stiffness of other specified joint, not elsewhere classified: Secondary | ICD-10-CM | POA: Diagnosis not present

## 2020-09-03 DIAGNOSIS — M5136 Other intervertebral disc degeneration, lumbar region: Secondary | ICD-10-CM | POA: Diagnosis not present

## 2020-09-03 DIAGNOSIS — M545 Low back pain, unspecified: Secondary | ICD-10-CM | POA: Diagnosis not present

## 2020-09-03 DIAGNOSIS — R531 Weakness: Secondary | ICD-10-CM | POA: Diagnosis not present

## 2020-09-04 ENCOUNTER — Telehealth: Payer: Self-pay

## 2020-09-08 DIAGNOSIS — M545 Low back pain, unspecified: Secondary | ICD-10-CM | POA: Diagnosis not present

## 2020-09-08 DIAGNOSIS — R531 Weakness: Secondary | ICD-10-CM | POA: Diagnosis not present

## 2020-09-08 DIAGNOSIS — M2569 Stiffness of other specified joint, not elsewhere classified: Secondary | ICD-10-CM | POA: Diagnosis not present

## 2020-09-08 DIAGNOSIS — M5136 Other intervertebral disc degeneration, lumbar region: Secondary | ICD-10-CM | POA: Diagnosis not present

## 2020-09-09 NOTE — Telephone Encounter (Signed)
Pt states that she is feeling better.

## 2020-09-15 ENCOUNTER — Other Ambulatory Visit: Payer: Medicare HMO

## 2020-09-15 ENCOUNTER — Other Ambulatory Visit: Payer: Self-pay

## 2020-09-15 ENCOUNTER — Ambulatory Visit (INDEPENDENT_AMBULATORY_CARE_PROVIDER_SITE_OTHER): Payer: Medicare HMO | Admitting: Family Medicine

## 2020-09-15 ENCOUNTER — Encounter: Payer: Self-pay | Admitting: Family Medicine

## 2020-09-15 ENCOUNTER — Ambulatory Visit (HOSPITAL_BASED_OUTPATIENT_CLINIC_OR_DEPARTMENT_OTHER)
Admission: RE | Admit: 2020-09-15 | Discharge: 2020-09-15 | Disposition: A | Discharge status: Home / Self Care | Payer: Medicare HMO | Source: Ambulatory Visit | Attending: Family Medicine | Admitting: Family Medicine

## 2020-09-15 ENCOUNTER — Telehealth: Payer: Self-pay

## 2020-09-15 DIAGNOSIS — R195 Other fecal abnormalities: Secondary | ICD-10-CM | POA: Insufficient documentation

## 2020-09-15 DIAGNOSIS — R739 Hyperglycemia, unspecified: Secondary | ICD-10-CM

## 2020-09-15 DIAGNOSIS — E559 Vitamin D deficiency, unspecified: Secondary | ICD-10-CM

## 2020-09-15 DIAGNOSIS — R109 Unspecified abdominal pain: Secondary | ICD-10-CM | POA: Diagnosis not present

## 2020-09-15 DIAGNOSIS — I10 Essential (primary) hypertension: Secondary | ICD-10-CM | POA: Diagnosis not present

## 2020-09-15 DIAGNOSIS — R531 Weakness: Secondary | ICD-10-CM | POA: Diagnosis not present

## 2020-09-15 DIAGNOSIS — M545 Low back pain, unspecified: Secondary | ICD-10-CM | POA: Diagnosis not present

## 2020-09-15 DIAGNOSIS — D649 Anemia, unspecified: Secondary | ICD-10-CM | POA: Diagnosis not present

## 2020-09-15 DIAGNOSIS — M5136 Other intervertebral disc degeneration, lumbar region: Secondary | ICD-10-CM | POA: Diagnosis not present

## 2020-09-15 DIAGNOSIS — M2569 Stiffness of other specified joint, not elsewhere classified: Secondary | ICD-10-CM | POA: Diagnosis not present

## 2020-09-15 MED ORDER — LIDOCAINE 2 % EX GEL: 1.0000 | Freq: Two times a day (BID) | CUTANEOUS | 1 refills | Status: DC | PRN

## 2020-09-15 NOTE — Telephone Encounter (Signed)
[facility] [place] [facility] - Client Client Site [facility] Day Physician Penni Homans - MD Contact Type Call Who Is Calling Patient / Member / Family / Caregiver Call Type Triage / Clinical Relationship To Patient Self Return Phone Number [phone] (Primary) Chief Complaint Blood In Stool Reason for Call Symptomatic / Request for Health Information Initial Comment Caller states she is having dark diarrhea. Cross Not Listed ED close to her house Translation No Nurse Assessment Nurse: Hassell Done, RN, Melanie Date/Time (Eastern Time): 09/12/2020 4:47:10 PM Confirm and document reason for call. If symptomatic, describe symptoms. ---Caller states she has had black diarrhea all day. She has had abdominal pain she would rate a 10 out of 10 for pain and she has had this pain for a week. Does the patient have any new or worsening symptoms? ---Yes Will a triage be completed? ---Yes Related visit to physician within the last 2 weeks? ---No Does the PT have any chronic conditions? (i.e. diabetes, asthma, this includes High risk factors for pregnancy, etc.) ---Yes List chronic conditions. ---asthma Is this a behavioral health or substance abuse call? ---No

## 2020-09-15 NOTE — Addendum Note (Signed)
Addended by: Randolm Idol A on: 09/15/2020 03:05 PM   Modules accepted: Orders

## 2020-09-15 NOTE — Addendum Note (Signed)
Addended by: Manuela Schwartz on: 09/15/2020 03:13 PM   Modules accepted: Orders

## 2020-09-15 NOTE — Addendum Note (Signed)
Addended by: Manuela Schwartz on: 09/15/2020 03:10 PM   Modules accepted: Orders

## 2020-09-15 NOTE — Assessment & Plan Note (Signed)
Well controlled, no changes to meds. Encouraged heart healthy diet such as the DASH diet and exercise as tolerated.  °

## 2020-09-15 NOTE — Telephone Encounter (Signed)
Can she get here at 3:30? Put her in 3:40 slot and send her to lab first. CBC with diff, cmp, sed rate and iron studies. For abdominal pain and dark stool

## 2020-09-15 NOTE — Assessment & Plan Note (Signed)
Diffuse abdominal pain for several days now. Started with 7 seven dark loose stool several days ago. Now having only one or two formed stool a day. Not black. Repeat stool studies. Check cbc with diff and cmp.

## 2020-09-15 NOTE — Progress Notes (Signed)
MyChart Video Visit    Virtual Visit via Video Note   This visit type was conducted due to national recommendations for restrictions regarding the COVID-19 Pandemic (e.g. social distancing) in an effort to limit this patient's exposure and mitigate transmission in our community. This patient is at least at moderate risk for complications without adequate follow up. This format is felt to be most appropriate for this patient at this time. Physical exam was limited by quality of the video and audio technology used for the visit. S Chism, [facility] was able to get the patient set up on a video visit.  Patient location: home Patient and provider in visit Provider location: Office  I discussed the limitations of evaluation and management by telemedicine and the availability of in person appointments. The patient expressed understanding and agreed to proceed.  Visit Date: 09/15/2020  Today's healthcare provider: Penni Homans, MD     Subjective:    Patient ID: Julie Wilkerson, female    DOB: [birth year], 80 y.o.   MRN: [id number]  Chief Complaint  Patient presents with  . dark stools  . Abdominal Pain    HPI Patient is in today for evaluation of worsening abdominal pain. She has been ill since last week on Thursday she started feeling poorly then by Friday she had 7 dark runny stool no light headed ness or syncope she did not go get looked at that stopped after a day and she has just one one more formed and brown bowel movement daily since then. No fevers or chills, but anorexia is noted. Denies CP/palp/SOB/HA/congestion/fevers or GU c/o. Taking meds as prescribed  Past Medical History:  Diagnosis Date  . Abdominal pain 11/07/2016  . Accelerating angina ([place]) 02/09/2019  . Acute diastolic CHF (congestive heart failure) ([place]) 02/16/2019  . Acute on chronic diastolic CHF (congestive heart failure) ([place]) 02/23/2019  . Acute pansinusitis 04/26/2017  . Amblyopia of eye, left 04/19/2018  .  Anemia   . Anginal pain ([place])   . Anxiety   . Arthritis   . Asthma   . Atypical chest pain 02/04/2019  . Bilateral carotid bruits 03/28/2017  . Blood transfusion without reported diagnosis   . Breast cancer ([place])    2002, left, encapsulated, microcalcifications. lumpectomy, radtiation x 30  . Breast cancer in female [facility])   . Bursitis of left hip 09/04/2018  . Change in bowel habits 02/22/2018  . Change in mole 06/08/2016  . Chest pain 03/04/2020  . CHF (congestive heart failure) ([place])   . 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 Chronic diastolic congestive heart failure ([place]) 05/28/2019  . Colitis   . Complication of anesthesia    VERY SENSITIVE TO ANESTHESIA   . Constipation 01/17/2018  . Coronary artery disease involving native coronary artery of native heart without angina pectoris 05/28/2019  . Cortical age-related cataract of right eye 04/18/2018  . Cough variant asthma  vs UACS from ACEi     Spirometry 12/02/2016  FEV1 1.26 (76%)  Ratio 78 with min curvature p last saba > 6 h prior  - trial off acei 12/02/2016  - 12/02/2016  After extensive coaching HFA effectiveness =    75% with qvar autohaler > rechallenge with 80 2bid    . Decreased visual acuity 01/16/2017  . Degeneration of lumbar intervertebral disc 08/01/2018  . Diarrhea 11/16/2015   D/o Diverticuli, polyps, celiac disease.    Marland Kitchen Dyspnea 11/04/2016   with asthma exacerbation only  . Dysuria  11/04/2016  . Elevated sed rate 10/23/2017  . Essential hypertension    Trial off acei 12/02/2016 due to cough/ pseudoasthma  . Gluten intolerance   . History of chicken pox   . History of Helicobacter pylori infection 08/01/2015  . History of rectal bleeding 06/01/2019  . Hx of LASIK 04/24/2019  . Hyperglycemia 11/07/2016  . Hyperlipidemia   . Hypertension   . Hyponatremia 02/16/2019  . Hypothyroidism   . IBS (irritable bowel syndrome)   . 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 Left hand pain 05/07/2018  . Left hip pain  03/01/2017  . Left leg pain 03/01/2017  . Left shoulder pain 05/07/2018  . Low back pain 08/10/2015  . LUQ pain 06/01/2019  . 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 Neck pain 03/01/2017  . Nuclear sclerotic cataract of right eye 04/18/2018  . Osteoporosis   . Overweight (BMI 25.0-29.9) 02/14/2019  . Pain in joint of left shoulder 08/11/2018  . Paroxysmal atrial tachycardia ([place]) 05/28/2019  . Pelvic pain 07/02/2018  . Personal history of radiation therapy   . Pneumonia   . PONV (postoperative nausea and vomiting)   . Poor appetite 02/22/2018  . Pseudophakia of left eye 04/18/2018  . PVD (posterior vitreous detachment), right 04/24/2019  . 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 Right hip pain 10/23/2017  . RLQ discomfort 03/01/2017  . RLS (restless legs syndrome) 05/07/2019  . 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 Sepsis due to pneumonia ([place]) 02/16/2019  . Stomach cramps   . Tremor 02/15/2016  . Urinary frequency 12/22/2017  . Urinary incontinence 05/07/2019  . Urinary tract infection 11/04/2016  . Vitamin D deficiency 04/29/2016    Past Surgical History:  Procedure Laterality Date  . ABDOMINAL HYSTERECTOMY     partial  . APPENDECTOMY    . BREAST LUMPECTOMY Left 2002  . CHOLECYSTECTOMY  Age 88 or 59  . COLONOSCOPY  2017  . EYE SURGERY Left    cataract  . LEFT HEART CATH AND CORONARY ANGIOGRAPHY N/A 02/09/2019   Procedure: LEFT HEART CATH AND CORONARY ANGIOGRAPHY;  Surgeon: Nelva Bush, MD;  Location: [facility];  Service: Cardiovascular;  Laterality: N/A;  . TONSILLECTOMY    . 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 Colitis Mother   . Irritable bowel syndrome Mother   . Hypertension Father   . Hyperlipidemia Brother   . Heart attack  Brother   . Stomach cancer Paternal Grandmother 23  . Colon cancer Paternal Grandmother   . Hyperlipidemia Brother   . Heart attack Brother   . Hyperlipidemia Brother   . Heart attack Brother   . Hyperlipidemia Sister   . Leukemia Daughter   . Arthritis Daughter   . Heart disease Daughter        ASD vs VSD  . Esophageal cancer Neg Hx     Social History   Socioeconomic History  . Marital status: Single    Spouse name: Not on file  . Number of children: 3  . Years of education: 51  . Highest education level: Master's degree (e.g., MA, MS, MEng, MEd, MSW, MBA)  Occupational History  . Occupation: retired    Comment: Pharmacist, hospital, kindergarten  Tobacco Use  . Smoking status:  Never Smoker  . Smokeless tobacco: Never Used  Vaping Use  . Vaping Use: Never used  Substance and Sexual Activity  . Alcohol use: Never    Alcohol/week: 0.0 standard drinks  . Drug use: Never  . Sexual activity: Not Currently    Birth control/protection: None    Comment: lives alone, avoids daiiry and gluten. 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 Not on file  Social History Narrative  . Not on file   Social Determinants of Health   Financial Resource Strain: Not on file  Food Insecurity: Not on file  Transportation Needs: Not on file  Physical Activity: Not on file  Stress: Not on file  Social Connections: Not on file  Intimate Partner Violence: Not on file    Outpatient Medications Prior to Visit  Medication Sig Dispense Refill  . albuterol (VENTOLIN HFA) 108 (90 Base) MCG/ACT inhaler Inhale 2 puffs into the lungs every 6 (six) hours as needed for wheezing or shortness of breath. 18 g 2  . ASPIRIN 81 PO 81 mg daily.     Marland Kitchen atorvastatin (LIPITOR) 20 MG tablet Take 1 tablet by mouth once daily 90 tablet 0  . citalopram (CELEXA) 20 MG tablet Take 1 tablet (20 mg total) by mouth at bedtime. 90 tablet 1  . COVID-19 mRNA vaccine, Pfizer, 30 MCG/0.3ML injection INJECT AS DIRECTED .3 mL 0   . dicyclomine (BENTYL) 10 MG capsule Take 1 capsule (10 mg total) by mouth 2 (two) times daily. 180 capsule 1  . fluticasone (FLONASE) 50 MCG/ACT nasal spray Place 2 sprays into both nostrils daily as needed for allergies or rhinitis. 16 g 5  . furosemide (LASIX) 40 MG tablet Take 1 tablet (40 mg total) by mouth daily. 90 tablet 1  . levothyroxine (SYNTHROID) 50 MCG tablet Take 1 tablet by mouth once daily 90 tablet 0  . lisinopril (ZESTRIL) 5 MG tablet Take 1 tablet by mouth twice daily (Patient taking differently: Take 5 mg by mouth daily.) 180 tablet 1  . metoprolol succinate (TOPROL-XL) 25 MG 24 hr tablet TAKE 1 TABLET BY MOUTH AT BEDTIME 90 tablet 0  . omega-3 acid ethyl esters (LOVAZA) 1 g capsule Take 2 g by mouth daily.    Marland Kitchen omeprazole (PRILOSEC) 40 MG capsule Take 1 capsule (40 mg total) by mouth in the morning and at bedtime. Take 1 tablet by mouth once to twice daily 60 capsule 5  . ondansetron (ZOFRAN) 4 MG tablet Dissolve 1 tablet under tongue once to twice a day, every day 90 tablet 3  . potassium chloride (KLOR-CON) 10 MEQ tablet Take 1 tablet by mouth once daily 90 tablet 1  . sucralfate (CARAFATE) 1 g tablet Take 1 tablet (1 g total) by mouth 2 (two) times daily. 60 tablet 0  . erythromycin ophthalmic ointment Place into the left eye 3 (three) times daily. (Patient not taking: Reported on 06/24/2020)    . leflunomide (ARAVA) 20 MG tablet Take 20 mg by mouth daily. (Patient not taking: Reported on 06/24/2020)    . meloxicam (MOBIC) 15 MG tablet Take 15 mg by mouth daily.    . pramipexole (MIRAPEX) 0.125 MG tablet Take 1 tablet (0.125 mg total) by mouth 2 (two) times daily. 60 tablet 1   No facility-administered medications prior to visit.    Not on File  Review of Systems  Constitutional: Negative for fever and malaise/fatigue.  HENT: Negative for congestion.   Eyes: Negative for blurred vision.  Respiratory:  Negative for shortness of breath.   Cardiovascular: Negative for  chest pain, palpitations and leg swelling.  Gastrointestinal: Negative for abdominal pain, blood in stool and nausea.  Genitourinary: Negative for dysuria and frequency.  Musculoskeletal: Negative for falls.  Skin: Negative for rash.  Neurological: Negative for dizziness, loss of consciousness and headaches.  Endo/Heme/Allergies: Negative for environmental allergies.  Psychiatric/Behavioral: Negative for depression. The patient is not nervous/anxious.        Objective:    Physical Exam Vitals and nursing note reviewed.  Constitutional:      General: She is not in acute distress.    Appearance: She is well-developed. She is not diaphoretic.  HENT:     Head: Normocephalic and atraumatic.     Nose: Nose normal.  Eyes:     General:        Right eye: No discharge.        Left eye: No discharge.  Cardiovascular:     Rate and Rhythm: Normal rate and regular rhythm.     Heart sounds: No murmur heard.   Pulmonary:     Effort: Pulmonary effort is normal.     Breath sounds: Normal breath sounds.  Abdominal:     General: Bowel sounds are normal. There is no distension.     Palpations: Abdomen is rigid.     Tenderness: There is generalized abdominal tenderness. There is no guarding. Negative signs include Murphy's sign and McBurney's sign.  Musculoskeletal:     Cervical back: Normal range of motion and neck supple.  Skin:    General: Skin is warm and dry.  Neurological:     Mental Status: She is alert and oriented to person, place, and time.     BP 114/62   Pulse (!) 58   Temp 98.3 F (36.8 C)   Resp 16   Wt 120 lb 6.4 oz (54.6 kg)   SpO2 98%   BMI 26.28 kg/m  Wt Readings from Last 3 Encounters:  09/15/20 120 lb 6.4 oz (54.6 kg)  06/24/20 120 lb 4 oz (54.5 kg)  04/22/20 120 lb 4 oz (54.5 kg)    Diabetic Foot Exam - Simple   No data filed    Lab Results  Component Value Date   WBC 4.6 05/29/2020   HGB 11.2 (L) 05/29/2020   HCT 33.6 (L) 05/29/2020   PLT 235.0  05/29/2020   GLUCOSE 89 05/29/2020   CHOL 160 05/29/2020   TRIG 105.0 05/29/2020   HDL 65.90 05/29/2020   LDLCALC 73 05/29/2020   ALT 14 05/29/2020   AST 22 05/29/2020   NA 139 05/29/2020   K 4.2 05/29/2020   CL 101 05/29/2020   CREATININE 1.20 05/29/2020   BUN 22 05/29/2020   CO2 30 05/29/2020   TSH 3.05 05/29/2020   INR 1.2 02/16/2019   HGBA1C 5.8 05/29/2020    Lab Results  Component Value Date   TSH 3.05 05/29/2020   Lab Results  Component Value Date   WBC 4.6 05/29/2020   HGB 11.2 (L) 05/29/2020   HCT 33.6 (L) 05/29/2020   MCV 89.3 05/29/2020   PLT 235.0 05/29/2020   Lab Results  Component Value Date   NA 139 05/29/2020   K 4.2 05/29/2020   CO2 30 05/29/2020   GLUCOSE 89 05/29/2020   BUN 22 05/29/2020   CREATININE 1.20 05/29/2020   BILITOT 0.8 05/29/2020   ALKPHOS 75 05/29/2020   AST 22 05/29/2020   ALT 14 05/29/2020   PROT 7.4  05/29/2020   ALBUMIN 4.7 05/29/2020   CALCIUM 9.5 05/29/2020   ANIONGAP 11 04/02/2020   GFR 43.35 (L) 05/29/2020   Lab Results  Component Value Date   CHOL 160 05/29/2020   Lab Results  Component Value Date   HDL 65.90 05/29/2020   Lab Results  Component Value Date   LDLCALC 73 05/29/2020   Lab Results  Component Value Date   TRIG 105.0 05/29/2020   Lab Results  Component Value Date   CHOLHDL 2 05/29/2020   Lab Results  Component Value Date   HGBA1C 5.8 05/29/2020       Assessment & Plan:   Problem List Items Addressed This Visit    Essential hypertension (Chronic)    Well controlled, no changes to meds. Encouraged heart healthy diet such as the DASH diet and exercise as tolerated.       Vitamin D deficiency    Supplement and monitor      Hyperglycemia    hgba1c acceptable, minimize simple carbs. Increase exercise as tolerated.       Abdominal pain    Diffuse abdominal pain for several days now. Started with 7 seven dark loose stool several days ago. Now having only one or two formed stool a day.  Not black. Repeat stool studies. Check cbc with diff and cmp.       Relevant Orders   DG Abd 2 Views   Hypertension    Well controlled, no changes to meds. Encouraged heart healthy diet such as the DASH diet and exercise as tolerated.       Dark stools    Patient reports 7 dark stool on Friday 3 days ago. No further ones since then. hemeoccult in office negative for blood today      Relevant Orders   DG Abd 2 Views      I have discontinued Emmakate Hypes. Wolken's pramipexole, erythromycin, leflunomide, and meloxicam. I am also having her start on Lidocaine. Additionally, I am having her maintain her omega-3 acid ethyl esters, albuterol, ASPIRIN 81 PO, fluticasone, lisinopril, sucralfate, omeprazole, potassium chloride, dicyclomine, ondansetron, levothyroxine, furosemide, citalopram, atorvastatin, metoprolol succinate, and COVID-19 mRNA vaccine [employer]).  Meds ordered this encounter  Medications  . Lidocaine 2 % GEL    Sig: Apply 1 Dose topically 2 (two) times daily as needed.    Dispense:  28.33 g    Refill:  1    I discussed the assessment and treatment plan with the patient. The patient was provided an opportunity to ask questions and all were answered. The patient agreed with the plan and demonstrated an understanding of the instructions.   The patient was advised to call back or seek an in-person evaluation if the symptoms worsen or if the condition fails to improve as anticipated.  I provided 30 minutes of face-to-face time during this encounter.   Penni Homans, MD [facility] [phone] (phone) [phone] (fax)  [place]

## 2020-09-15 NOTE — Assessment & Plan Note (Signed)
Supplement and monitor 

## 2020-09-15 NOTE — Assessment & Plan Note (Signed)
Patient reports 7 dark stool on Friday 3 days ago. No further ones since then. hemeoccult in office negative for blood today

## 2020-09-15 NOTE — Telephone Encounter (Signed)
Pt is scheduled for today  °

## 2020-09-15 NOTE — Assessment & Plan Note (Signed)
hgba1c acceptable, minimize simple carbs. Increase exercise as tolerated.  

## 2020-09-15 NOTE — Patient Instructions (Signed)
24 hours clear liquids then 4 hours of BRAT (bananas, rice, applesauce, toast) then can progress diet with lean proteins etc as tolerated   Abdominal Pain, Adult Pain in the abdomen (abdominal pain) can be caused by many things. Often, abdominal pain is not serious and it gets better with no treatment or by being treated at home. However, sometimes abdominal pain is serious. Your health care provider will ask questions about your medical history and do a physical exam to try to determine the cause of your abdominal pain. Follow these instructions at home: Medicines  Take over-the-counter and prescription medicines only as told by your health care provider.  Do not take a laxative unless told by your health care provider. General instructions  Watch your condition for any changes.  Drink enough fluid to keep your urine pale yellow.  Keep all follow-up visits as told by your health care provider. This is important.   Contact a health care provider if:  Your abdominal pain changes or gets worse.  You are not hungry or you lose weight without trying.  You are constipated or have diarrhea for more than 2-3 days.  You have pain when you urinate or have a bowel movement.  Your abdominal pain wakes you up at night.  Your pain gets worse with meals, after eating, or with certain foods.  You are vomiting and cannot keep anything down.  You have a fever.  You have blood in your urine. Get help right away if:  Your pain does not go away as soon as your health care provider told you to expect.  You cannot stop vomiting.  Your pain is only in areas of the abdomen, such as the right side or the left lower portion of the abdomen. Pain on the right side could be caused by appendicitis.  You have bloody or black stools, or stools that look like tar.  You have severe pain, cramping, or bloating in your abdomen.  You have signs of dehydration, such as: ? Dark urine, very little urine,  or no urine. ? Cracked lips. ? Dry mouth. ? Sunken eyes. ? Sleepiness. ? Weakness.  You have trouble breathing or chest pain. Summary  Often, abdominal pain is not serious and it gets better with no treatment or by being treated at home. However, sometimes abdominal pain is serious.  Watch your condition for any changes.  Take over-the-counter and prescription medicines only as told by your health care provider.  Contact a health care provider if your abdominal pain changes or gets worse.  Get help right away if you have severe pain, cramping, or bloating in your abdomen. This information is not intended to replace advice given to you by your health care provider. Make sure you discuss any questions you have with your health care provider. Document Revised: 06/29/2019 Document Reviewed: 09/18/2018 Elsevier Patient Education  [place].

## 2020-09-15 NOTE — Telephone Encounter (Signed)
Pt is having dark stool with abdomen pain

## 2020-09-16 ENCOUNTER — Ambulatory Visit: Payer: Medicare HMO | Admitting: Family Medicine

## 2020-09-16 DIAGNOSIS — R531 Weakness: Secondary | ICD-10-CM | POA: Diagnosis not present

## 2020-09-16 DIAGNOSIS — M2569 Stiffness of other specified joint, not elsewhere classified: Secondary | ICD-10-CM | POA: Diagnosis not present

## 2020-09-16 DIAGNOSIS — M5136 Other intervertebral disc degeneration, lumbar region: Secondary | ICD-10-CM | POA: Diagnosis not present

## 2020-09-16 DIAGNOSIS — M545 Low back pain, unspecified: Secondary | ICD-10-CM | POA: Diagnosis not present

## 2020-09-16 LAB — COMPREHENSIVE METABOLIC PANEL
ALT: 21 U/L (ref 0–35)
AST: 26 U/L (ref 0–37)
Albumin: 4 g/dL (ref 3.5–5.2)
Alkaline Phosphatase: 67 U/L (ref 39–117)
BUN: 22 mg/dL (ref 6–23)
CO2: 26 mEq/L (ref 19–32)
Calcium: 8.6 mg/dL (ref 8.4–10.5)
Chloride: 103 mEq/L (ref 96–112)
Creatinine, Ser: 1.23 mg/dL — ABNORMAL HIGH (ref 0.40–1.20)
GFR: 42 mL/min — ABNORMAL LOW (ref >60.00)
Glucose, Bld: 105 mg/dL — ABNORMAL HIGH (ref 70–99)
Potassium: 4.6 mEq/L (ref 3.5–5.1)
Sodium: 136 mEq/L (ref 135–145)
Total Bilirubin: 0.4 mg/dL (ref 0.2–1.2)
Total Protein: 6.8 g/dL (ref 6.0–8.3)

## 2020-09-16 LAB — CBC WITH DIFFERENTIAL/PLATELET
Basophils Absolute: 0.1 10*3/uL (ref 0.0–0.1)
Basophils Relative: 1.1 % (ref 0.0–3.0)
Eosinophils Absolute: 0 10*3/uL (ref 0.0–0.7)
Eosinophils Relative: 0 % (ref 0.0–5.0)
HCT: 31.3 % — ABNORMAL LOW (ref 36.0–46.0)
Hemoglobin: 10.6 g/dL — ABNORMAL LOW (ref 12.0–15.0)
Lymphocytes Relative: 35.4 % (ref 12.0–46.0)
Lymphs Abs: 1.9 10*3/uL (ref 0.7–4.0)
MCHC: 33.7 g/dL (ref 30.0–36.0)
MCV: 88.1 fl (ref 78.0–100.0)
Monocytes Absolute: 0.7 10*3/uL (ref 0.1–1.0)
Monocytes Relative: 13.2 % — ABNORMAL HIGH (ref 3.0–12.0)
Neutro Abs: 2.7 10*3/uL (ref 1.4–7.7)
Neutrophils Relative %: 50.3 % (ref 43.0–77.0)
Platelets: 192 10*3/uL (ref 150.0–400.0)
RBC: 3.56 Mil/uL — ABNORMAL LOW (ref 3.87–5.11)
RDW: 14.7 % (ref 11.5–15.5)
WBC: 5.3 10*3/uL (ref 4.0–10.5)

## 2020-09-16 LAB — IRON,TIBC AND FERRITIN PANEL
%SAT: 13 % (calc) — ABNORMAL LOW (ref 16–45)
Ferritin: 28 ng/mL (ref 16–288)
Iron: 40 ug/dL — ABNORMAL LOW (ref 45–160)
TIBC: 301 mcg/dL (calc) (ref 250–450)

## 2020-09-16 LAB — SEDIMENTATION RATE: Sed Rate: 42 mm/hr — ABNORMAL HIGH (ref 0–30)

## 2020-09-17 ENCOUNTER — Other Ambulatory Visit (INDEPENDENT_AMBULATORY_CARE_PROVIDER_SITE_OTHER): Payer: Medicare HMO

## 2020-09-17 ENCOUNTER — Other Ambulatory Visit: Payer: Self-pay

## 2020-09-17 ENCOUNTER — Other Ambulatory Visit: Payer: Self-pay | Admitting: Family Medicine

## 2020-09-17 DIAGNOSIS — D649 Anemia, unspecified: Secondary | ICD-10-CM | POA: Diagnosis not present

## 2020-09-17 DIAGNOSIS — K56609 Unspecified intestinal obstruction, unspecified as to partial versus complete obstruction: Secondary | ICD-10-CM

## 2020-09-17 MED ORDER — IRON (FERROUS SULFATE) 325 (65 FE) MG PO TABS: 325.0000 mg | ORAL_TABLET | Freq: Every day | ORAL | 3 refills | Status: DC

## 2020-09-17 NOTE — Telephone Encounter (Signed)
Labs put in

## 2020-09-18 DIAGNOSIS — G4733 Obstructive sleep apnea (adult) (pediatric): Secondary | ICD-10-CM | POA: Diagnosis not present

## 2020-09-18 LAB — CBC WITH DIFFERENTIAL/PLATELET
Basophils Absolute: 0 10*3/uL (ref 0.0–0.1)
Basophils Relative: 0.7 % (ref 0.0–3.0)
Eosinophils Absolute: 0 10*3/uL (ref 0.0–0.7)
Eosinophils Relative: 0 % (ref 0.0–5.0)
HCT: 31.5 % — ABNORMAL LOW (ref 36.0–46.0)
Hemoglobin: 10.6 g/dL — ABNORMAL LOW (ref 12.0–15.0)
Lymphocytes Relative: 28.9 % (ref 12.0–46.0)
Lymphs Abs: 1.7 10*3/uL (ref 0.7–4.0)
MCHC: 33.7 g/dL (ref 30.0–36.0)
MCV: 88.6 fl (ref 78.0–100.0)
Monocytes Absolute: 0.7 10*3/uL (ref 0.1–1.0)
Monocytes Relative: 12.4 % — ABNORMAL HIGH (ref 3.0–12.0)
Neutro Abs: 3.4 10*3/uL (ref 1.4–7.7)
Neutrophils Relative %: 58 % (ref 43.0–77.0)
Platelets: 187 10*3/uL (ref 150.0–400.0)
RBC: 3.55 Mil/uL — ABNORMAL LOW (ref 3.87–5.11)
RDW: 15.1 % (ref 11.5–15.5)
WBC: 5.9 10*3/uL (ref 4.0–10.5)

## 2020-09-19 ENCOUNTER — Other Ambulatory Visit: Payer: Self-pay

## 2020-09-19 ENCOUNTER — Other Ambulatory Visit: Payer: Medicare HMO

## 2020-09-19 DIAGNOSIS — D649 Anemia, unspecified: Secondary | ICD-10-CM

## 2020-09-19 NOTE — Addendum Note (Signed)
Addended by: Manuela Schwartz on: 09/19/2020 01:15 PM   Modules accepted: Orders

## 2020-09-22 ENCOUNTER — Other Ambulatory Visit: Payer: Self-pay

## 2020-09-22 ENCOUNTER — Encounter (HOSPITAL_BASED_OUTPATIENT_CLINIC_OR_DEPARTMENT_OTHER): Payer: Self-pay

## 2020-09-22 ENCOUNTER — Ambulatory Visit (HOSPITAL_BASED_OUTPATIENT_CLINIC_OR_DEPARTMENT_OTHER)
Admission: RE | Admit: 2020-09-22 | Discharge: 2020-09-22 | Disposition: A | Discharge status: Home / Self Care | Payer: Medicare HMO | Source: Ambulatory Visit | Attending: Family Medicine | Admitting: Family Medicine

## 2020-09-22 DIAGNOSIS — K5730 Diverticulosis of large intestine without perforation or abscess without bleeding: Secondary | ICD-10-CM | POA: Diagnosis not present

## 2020-09-22 DIAGNOSIS — R194 Change in bowel habit: Secondary | ICD-10-CM | POA: Diagnosis not present

## 2020-09-22 DIAGNOSIS — K449 Diaphragmatic hernia without obstruction or gangrene: Secondary | ICD-10-CM | POA: Diagnosis not present

## 2020-09-22 DIAGNOSIS — Z9049 Acquired absence of other specified parts of digestive tract: Secondary | ICD-10-CM | POA: Diagnosis not present

## 2020-09-22 DIAGNOSIS — K56609 Unspecified intestinal obstruction, unspecified as to partial versus complete obstruction: Secondary | ICD-10-CM | POA: Diagnosis not present

## 2020-09-22 MED ORDER — IOHEXOL 300 MG/ML  SOLN
100.0000 mL | Freq: Once | INTRAMUSCULAR | Status: AC | PRN
  Administered 2020-09-22: 80 mL via INTRAVENOUS

## 2020-09-23 ENCOUNTER — Other Ambulatory Visit (INDEPENDENT_AMBULATORY_CARE_PROVIDER_SITE_OTHER): Payer: Medicare HMO

## 2020-09-23 DIAGNOSIS — D649 Anemia, unspecified: Secondary | ICD-10-CM | POA: Diagnosis not present

## 2020-09-23 LAB — FECAL OCCULT BLOOD, IMMUNOCHEMICAL: Fecal Occult Bld: NEGATIVE

## 2020-09-24 ENCOUNTER — Telehealth: Payer: Self-pay | Admitting: Gastroenterology

## 2020-09-24 NOTE — Telephone Encounter (Signed)
Spoke with patient, she has been scheduled for a follow up with Dr. Havery Moros on Thursday, 10/09/20 at 1:20 PM. CT report is in epic.

## 2020-09-24 NOTE — Telephone Encounter (Signed)
Inbound call from patient stating she had a CT scan with her PCP and they informed her she has diverticulosis.  She wants to schedule an appt with Dr. Havery Moros but informed her he does not have anything until June and with an App it will be 10/14/20.  Requested to speak with a nurse to see if she can be seen sooner.  Please advise.

## 2020-09-29 ENCOUNTER — Telehealth: Payer: Self-pay | Admitting: *Deleted

## 2020-09-29 NOTE — Telephone Encounter (Signed)
Informed by Theressa Stamps, RT with [facility] while here in clinic with Dr Claiborne Billings that the patient was scheduled for her CPAP set up on 09/18/20. After being 55 minutes late for her appointment she tells Anderson Malta that she will be out of the country in [place], [place] from 10/12/20 to 12/06/20. Anderson Malta told her that she could not be monitored for compliance in [place]. Once she returns from [place] she will be home for 1 week then she will leave again for [place]. She will be in [place] 12/12/20 to 12/18/20. She told Anderson Malta that she will contact her once she returns home to get another set up appointment.

## 2020-10-02 DIAGNOSIS — N952 Postmenopausal atrophic vaginitis: Secondary | ICD-10-CM | POA: Diagnosis not present

## 2020-10-02 DIAGNOSIS — R350 Frequency of micturition: Secondary | ICD-10-CM | POA: Diagnosis not present

## 2020-10-02 DIAGNOSIS — R3915 Urgency of urination: Secondary | ICD-10-CM | POA: Diagnosis not present

## 2020-10-02 DIAGNOSIS — N3020 Other chronic cystitis without hematuria: Secondary | ICD-10-CM | POA: Diagnosis not present

## 2020-10-03 ENCOUNTER — Other Ambulatory Visit: Payer: Self-pay

## 2020-10-03 ENCOUNTER — Other Ambulatory Visit (INDEPENDENT_AMBULATORY_CARE_PROVIDER_SITE_OTHER): Payer: Medicare HMO

## 2020-10-03 DIAGNOSIS — D649 Anemia, unspecified: Secondary | ICD-10-CM | POA: Diagnosis not present

## 2020-10-03 LAB — CBC WITH DIFFERENTIAL/PLATELET
Basophils Absolute: 0 10*3/uL (ref 0.0–0.1)
Basophils Relative: 0.9 % (ref 0.0–3.0)
Eosinophils Absolute: 0 10*3/uL (ref 0.0–0.7)
Eosinophils Relative: 0 % (ref 0.0–5.0)
HCT: 31.7 % — ABNORMAL LOW (ref 36.0–46.0)
Hemoglobin: 10.9 g/dL — ABNORMAL LOW (ref 12.0–15.0)
Lymphocytes Relative: 28.4 % (ref 12.0–46.0)
Lymphs Abs: 1.4 10*3/uL (ref 0.7–4.0)
MCHC: 34.3 g/dL (ref 30.0–36.0)
MCV: 87.1 fl (ref 78.0–100.0)
Monocytes Absolute: 0.5 10*3/uL (ref 0.1–1.0)
Monocytes Relative: 9.7 % (ref 3.0–12.0)
Neutro Abs: 3.1 10*3/uL (ref 1.4–7.7)
Neutrophils Relative %: 61 % (ref 43.0–77.0)
Platelets: 201 10*3/uL (ref 150.0–400.0)
RBC: 3.64 Mil/uL — ABNORMAL LOW (ref 3.87–5.11)
RDW: 15 % (ref 11.5–15.5)
WBC: 5 10*3/uL (ref 4.0–10.5)

## 2020-10-06 ENCOUNTER — Encounter: Payer: Self-pay | Admitting: Family Medicine

## 2020-10-06 ENCOUNTER — Other Ambulatory Visit: Payer: Self-pay | Admitting: Family Medicine

## 2020-10-07 ENCOUNTER — Other Ambulatory Visit: Payer: Self-pay | Admitting: Family Medicine

## 2020-10-07 MED ORDER — LISINOPRIL 10 MG PO TABS: 10.0000 mg | ORAL_TABLET | Freq: Two times a day (BID) | ORAL | 1 refills | Status: DC | PRN

## 2020-10-09 ENCOUNTER — Ambulatory Visit: Payer: Medicare HMO | Admitting: Gastroenterology

## 2020-10-09 ENCOUNTER — Telehealth: Payer: Self-pay

## 2020-10-09 ENCOUNTER — Other Ambulatory Visit: Payer: Self-pay | Admitting: Family Medicine

## 2020-10-09 DIAGNOSIS — M5416 Radiculopathy, lumbar region: Secondary | ICD-10-CM | POA: Diagnosis not present

## 2020-10-09 DIAGNOSIS — M48062 Spinal stenosis, lumbar region with neurogenic claudication: Secondary | ICD-10-CM | POA: Diagnosis not present

## 2020-10-09 MED ORDER — HYOSCYAMINE SULFATE 0.125 MG SL SUBL: 0.1250 mg | SUBLINGUAL_TABLET | Freq: Two times a day (BID) | SUBLINGUAL | 1 refills | Status: DC | PRN

## 2020-10-09 NOTE — Telephone Encounter (Signed)
I have sent in a few Hyoscyamine and she can take it up to twice a day for abdominal pain, cramping not specifically for diarrhea. Because she is on Dicyclomine regularly from Dr Havery Moros she can only take one or two doses a day and not at the same time as the Dicyclomine.

## 2020-10-09 NOTE — Telephone Encounter (Signed)
Please advise 

## 2020-10-09 NOTE — Telephone Encounter (Signed)
Pt called wanting a refill on Hyoscyamine 0.125mg  for diarrhea. Medication is not on her current med list and is wanting a refill for  Was advised that she might need an apt for this. Pt said that she been on it and just does not take it often. Please advise the patient. -JMA

## 2020-10-10 ENCOUNTER — Ambulatory Visit: Payer: Medicare HMO | Attending: Critical Care Medicine

## 2020-10-10 DIAGNOSIS — Z20822 Contact with and (suspected) exposure to covid-19: Secondary | ICD-10-CM | POA: Diagnosis not present

## 2020-10-10 NOTE — Telephone Encounter (Signed)
Pt is aware instruction 

## 2020-10-11 LAB — NOVEL CORONAVIRUS, NAA: SARS-CoV-2, NAA: NOT DETECTED

## 2020-10-11 LAB — SARS-COV-2, NAA 2 DAY TAT

## 2020-11-18 ENCOUNTER — Other Ambulatory Visit: Payer: Self-pay | Admitting: Cardiology

## 2020-12-17 ENCOUNTER — Encounter: Payer: Self-pay | Admitting: Family Medicine

## 2020-12-19 ENCOUNTER — Ambulatory Visit (INDEPENDENT_AMBULATORY_CARE_PROVIDER_SITE_OTHER): Payer: Medicare HMO

## 2020-12-19 VITALS — Ht <= 58 in | Wt 120.0 lb

## 2020-12-19 DIAGNOSIS — Z0000 Encounter for general adult medical examination without abnormal findings: Secondary | ICD-10-CM

## 2020-12-19 NOTE — Progress Notes (Signed)
Subjective:   Julie Wilkerson is a 80 y.o. female who presents for Medicare Annual (Subsequent) preventive examination.  I connected with [name] today by telephone and verified that I am speaking with the correct person using two identifiers. Location patient: home Location provider: work Persons participating in the virtual visit: patient, Marine scientist.    I discussed the limitations, risks, security and privacy concerns of performing an evaluation and management service by telephone and the availability of in person appointments. I also discussed with the patient that there may be a patient responsible charge related to this service. The patient expressed understanding and verbally consented to this telephonic visit.    Interactive audio and video telecommunications were attempted between this provider and patient, however failed, due to patient having technical difficulties OR patient did not have access to video capability.  We continued and completed visit with audio only.  Some vital signs may be absent or patient reported.   Time Spent with patient on telephone encounter: 20 minutes   Review of Systems     Cardiac Risk Factors include: advanced age (>71mn, >>52women);dyslipidemia;hypertension     Objective:    Today's Vitals   12/19/20 0905 12/19/20 0906  Weight: 120 lb (54.4 kg)   Height: '4\' 10"'$  (1.473 m)   PainSc:  5    Body mass index is 25.08 kg/m.  Advanced Directives 12/19/2020 04/02/2020 03/04/2020 12/12/2019 02/23/2019 02/22/2019 02/16/2019  Does Patient Have a Medical Advance Directive? Yes No No Yes Yes Yes Yes  Type of [place] - - [place] Living will Living will Living will  Does patient want to make changes to medical advance directive? - - - No - Patient declined No - Patient declined - No - Patient declined  Copy of [place]? - - - No - copy requested -  - -  Would patient like information on creating a medical advance directive? - - - - No - Patient declined - No - Patient declined    Current Medications (verified) Outpatient Encounter Medications as of 12/19/2020  Medication Sig   albuterol (VENTOLIN HFA) 108 (90 Base) MCG/ACT inhaler Inhale 2 puffs into the lungs every 6 (six) hours as needed for wheezing or shortness of breath.   ASPIRIN 81 PO 81 mg daily.    atorvastatin (LIPITOR) 20 MG tablet Take 1 tablet by mouth once daily   citalopram (CELEXA) 20 MG tablet Take 1 tablet (20 mg total) by mouth at bedtime.   fluticasone (FLONASE) 50 MCG/ACT nasal spray Place 2 sprays into both nostrils daily as needed for allergies or rhinitis.   furosemide (LASIX) 40 MG tablet Take 1 tablet (40 mg total) by mouth daily.   hyoscyamine (LEVSIN SL) 0.125 MG SL tablet Place 1 tablet (0.125 mg total) under the tongue 2 (two) times daily as needed.   Iron, Ferrous Sulfate, 325 (65 Fe) MG TABS Take 325 mg by mouth daily.   levothyroxine (SYNTHROID) 50 MCG tablet Take 1 tablet (50 mcg total) by mouth daily before breakfast.   Lidocaine 2 % GEL Apply 1 Dose topically 2 (two) times daily as needed.   lisinopril (ZESTRIL) 10 MG tablet Take 1 tablet (10 mg total) by mouth 2 (two) times daily as needed.   metoprolol succinate (TOPROL-XL) 25 MG 24 hr tablet TAKE 1 TABLET BY MOUTH AT BEDTIME   omega-3 acid ethyl esters (LOVAZA) 1 g capsule Take 2 g by  mouth daily.   omeprazole (PRILOSEC) 40 MG capsule Take 1 capsule (40 mg total) by mouth in the morning and at bedtime. Take 1 tablet by mouth once to twice daily   ondansetron (ZOFRAN) 4 MG tablet Dissolve 1 tablet under tongue once to twice a day, every day   potassium chloride (KLOR-CON) 10 MEQ tablet Take 1 tablet by mouth once daily   sucralfate (CARAFATE) 1 g tablet Take 1 tablet (1 g total) by mouth 2 (two) times daily.   [DISCONTINUED] COVID-19 mRNA vaccine, Pfizer, 30 MCG/0.3ML injection INJECT AS DIRECTED    No facility-administered encounter medications on file as of 12/19/2020.    Allergies (verified) Patient has no allergy information on record.   History: Past Medical History:  Diagnosis Date   Abdominal pain 11/07/2016   Accelerating angina ([place]) [id number]   Acute diastolic CHF (congestive heart failure) ([place]) 02/16/2019   Acute on chronic diastolic CHF (congestive heart failure) ([place]) 02/23/2019   Acute pansinusitis 04/26/2017   Amblyopia of eye, left 04/19/2018   Anemia    Anginal pain (HCC)    Anxiety    Arthritis    Asthma    Atypical chest pain 02/04/2019   Bilateral carotid bruits 03/28/2017   Blood transfusion without reported diagnosis    Breast cancer ([place])    2002, left, encapsulated, microcalcifications. lumpectomy, radtiation x 30   Breast cancer in female [facility])    Bursitis of left hip 09/04/2018   Change in bowel habits 02/22/2018   Change in mole 06/08/2016   Chest pain 03/04/2020   CHF (congestive heart failure) (HCC)    Chronic bilateral thoracic back pain 05/23/2016   Chronic diastolic congestive heart failure ([place]) 05/28/2019   Colitis    Complication of anesthesia    VERY SENSITIVE TO ANESTHESIA    Constipation 01/17/2018   Coronary artery disease involving native coronary artery of native heart without angina pectoris 05/28/2019   Cortical age-related cataract of right eye 04/18/2018   Cough variant asthma  vs UACS from ACEi     Spirometry 12/02/2016  FEV1 1.26 (76%)  Ratio 78 with min curvature p last saba > 6 h prior  - trial off acei 12/02/2016  - 12/02/2016  After extensive coaching HFA effectiveness =    75% with qvar autohaler > rechallenge with 80 2bid     Decreased visual acuity 01/16/2017   Degeneration of lumbar intervertebral disc 08/01/2018   Diarrhea 11/16/2015   D/o Diverticuli, polyps, celiac disease.     Dyspnea 11/04/2016   with asthma exacerbation only   Dysuria 11/04/2016   Elevated sed rate 10/23/2017   Essential hypertension    Trial off acei  12/02/2016 due to cough/ pseudoasthma   Gluten intolerance    History of chicken pox    History of Helicobacter pylori infection 08/01/2015   History of rectal bleeding 06/01/2019   Hx of LASIK 04/24/2019   Hyperglycemia 11/07/2016   Hyperlipidemia    Hypertension    Hyponatremia 02/16/2019   Hypothyroidism    IBS (irritable bowel syndrome)    Lattice degeneration of right retina 04/24/2019   Left hand pain 05/07/2018   Left hip pain 03/01/2017   Left leg pain 03/01/2017   Left shoulder pain 05/07/2018   Low back pain 08/10/2015   LUQ pain 06/01/2019   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   Neck pain 03/01/2017   Nuclear sclerotic cataract of right eye 04/18/2018   Osteoporosis  Overweight (BMI 25.0-29.9) 02/14/2019   Pain in joint of left shoulder 08/11/2018   Paroxysmal atrial tachycardia (HCC) 05/28/2019   Pelvic pain 07/02/2018   Personal history of radiation therapy    Pneumonia    PONV (postoperative nausea and vomiting)    Poor appetite 02/22/2018   Pseudophakia of left eye 04/18/2018   PVD (posterior vitreous detachment), right 04/24/2019   Restless sleeper 11/07/2016   Right hip pain 10/23/2017   RLQ discomfort 03/01/2017   RLS (restless legs syndrome) 05/07/2019   S/P left THA, AA 02/13/2019   Sepsis due to pneumonia ([place]) 02/16/2019   Stomach cramps    Tremor 02/15/2016   Urinary frequency 12/22/2017   Urinary incontinence 05/07/2019   Urinary tract infection 11/04/2016   Vitamin D deficiency 04/29/2016   Past Surgical History:  Procedure Laterality Date   ABDOMINAL HYSTERECTOMY     partial   APPENDECTOMY     BREAST LUMPECTOMY Left 2002   CHOLECYSTECTOMY  Age 54 or 77   COLONOSCOPY  2017   EYE SURGERY Left    cataract   LEFT HEART CATH AND CORONARY ANGIOGRAPHY N/A 02/09/2019   Procedure: LEFT HEART CATH AND CORONARY ANGIOGRAPHY;  Surgeon: Nelva Bush, MD;  Location: [facility];  Service: Cardiovascular;  Laterality:  N/A;   TONSILLECTOMY     TOTAL HIP ARTHROPLASTY Left 02/13/2019   Procedure: TOTAL HIP ARTHROPLASTY ANTERIOR APPROACH;  Surgeon: Paralee Cancel, MD;  Location: [facility];  Service: Orthopedics;  Laterality: Left;  70 mins   Family History  Problem Relation Age of Onset   Colitis Mother    Irritable bowel syndrome Mother    Hypertension Father    Hyperlipidemia Brother    Heart attack Brother    Stomach cancer Paternal Grandmother 13   Colon cancer Paternal Grandmother    Hyperlipidemia Brother    Heart attack Brother    Hyperlipidemia Brother    Heart attack Brother    Hyperlipidemia Sister    Leukemia Daughter    Arthritis Daughter    Heart disease Daughter        ASD vs VSD   Esophageal cancer Neg Hx    Social History   Socioeconomic History   Marital status: Single    Spouse name: Not on file   Number of children: 3   Years of education: 18   Highest education level: Master's degree (e.g., MA, MS, MEng, MEd, MSW, MBA)  Occupational History   Occupation: retired    Comment: Pharmacist, hospital, kindergarten  Tobacco Use   Smoking status: Never   Smokeless tobacco: Never  Vaping Use   Vaping Use: Never used  Substance and Sexual Activity   Alcohol use: Never    Alcohol/week: 0.0 standard drinks   Drug use: Never   Sexual activity: Not Currently    Birth control/protection: None    Comment: lives alone, avoids daiiry and gluten. 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  Other Topics Concern   Not on file  Social History Narrative   Not on file   Social Determinants of Health   Financial Resource Strain: Low Risk    Difficulty of Paying Living Expenses: Not hard at all  Food Insecurity: No Food Insecurity   Worried About Charity fundraiser in the Last Year: Never true   [place] in the Last Year: Never true  Transportation Needs: No Transportation Needs   Lack of Transportation (Medical): No   Lack of Transportation (Non-Medical): No  Physical Activity: Insufficiently Active  Days of Exercise per Week: 1 day   Minutes of Exercise per Session: 30 min  Stress: No Stress Concern Present   Feeling of Stress : Not at all  Social Connections: Not on file    Tobacco Counseling Counseling given: Not Answered   Clinical Intake:  Pre-visit preparation completed: Yes  Pain : 0-10 Pain Score: 5  Pain Type: Chronic pain Pain Location: Back Pain Orientation: Lower Pain Onset: More than a month ago Pain Frequency: Constant     Nutritional Status: BMI 25 -29 Overweight Nutritional Risks: None Diabetes: No  How often do you need to have someone help you when you read instructions, pamphlets, or other written materials from your doctor or pharmacy?: 1 - Never  Diabetic?No  Interpreter Needed?: No  Information entered by :: Caroleen Hamman LPN   Activities of Daily Living In your present state of health, do you have any difficulty performing the following activities: 12/19/2020  Hearing? N  Vision? N  Difficulty concentrating or making decisions? N  Walking or climbing stairs? N  Dressing or bathing? N  Doing errands, shopping? N  Preparing Food and eating ? N  Using the Toilet? N  In the past six months, have you accidently leaked urine? Y  Comment occasionally  Do you have problems with loss of bowel control? N  Managing your Medications? N  Managing your Finances? N  Housekeeping or managing your Housekeeping? N  Some recent data might be hidden    Patient Care Team: [name], MD as PCP - General (Family Medicine) Berniece Salines, DO as PCP - Cardiology (Cardiology) Lake Bells., MD as Consulting Physician (Gastroenterology) Belva Bertin, [place] as Physician Assistant (Family Medicine) Day, Melvenia Beam, [facility] (Inactive) as Pharmacist (Pharmacist)  Indicate any recent Medical Services you may have received from other than [facility] providers in the past year (date may be approximate).     Assessment:   This is a routine wellness examination  for Aniella.  Hearing/Vision screen Hearing Screening - Comments:: No issues Vision Screening - Comments:: Last eye exam-04/2020  Dietary issues and exercise activities discussed: Current Exercise Habits: Home exercise routine, Type of exercise: walking, Time (Minutes): 30, Frequency (Times/Week): 1, Weekly Exercise (Minutes/Week): 30, Intensity: Mild   Goals Addressed             This Visit's Progress    Maintain healthy lifestyle   On track    Restart exercising 4x/ week.   Not on track      Depression Screen PHQ 2/9 Scores 12/19/2020 06/24/2020 01/31/2020 12/12/2019 02/01/2019 07/06/2018 06/27/2017  PHQ - 2 Score 0 0 0 0 0 0 0  PHQ- 9 Score - - - - - - -    Fall Risk Fall Risk  12/19/2020 06/24/2020 12/12/2019 02/01/2019 01/16/2019  Falls in the past year? 0 0 0 0 0  Number falls in past yr: 0 0 0 0 0  Injury with Fall? 0 0 0 0 0  Follow up Falls prevention discussed - Education provided;Falls prevention discussed - -    FALL RISK PREVENTION PERTAINING TO THE HOME:  Any stairs in or around the home? No  Home free of loose throw rugs in walkways, pet beds, electrical cords, etc? Yes  Adequate lighting in your home to reduce risk of falls? Yes   ASSISTIVE DEVICES UTILIZED TO PREVENT FALLS:  Life alert? No  Use of a cane, walker or w/c? No  Grab bars in the bathroom? Yes  Shower chair  or bench in shower? No  Elevated toilet seat or a handicapped toilet? No   TIMED UP AND GO:  Was the test performed? No . Phone visit   Cognitive Function:Normal cognitive status assessed by this Nurse Health Advisor. No abnormalities found.   MMSE - Mini Mental State Exam 06/27/2017 04/29/2016  Orientation to time 5 5  Orientation to Place 5 5  Registration 3 3  Attention/ Calculation 5 5  Recall 3 3  Language- name 2 objects 2 2  Language- repeat 1 1  Language- follow 3 step command 3 3  Language- read & follow direction 1 1  Write a sentence 1 1  Copy design 1 1  Total score 30 30    Montreal Cognitive Assessment  07/14/2018  Visuospatial/ Executive (0/5) 4  Naming (0/3) 3  Attention: Read list of digits (0/2) 1  Attention: Read list of letters (0/1) 1  Attention: Serial 7 subtraction starting at 100 (0/3) 3  Language: Repeat phrase (0/2) 1  Language : Fluency (0/1) 0  Abstraction (0/2) 1  Delayed Recall (0/5) 4  Orientation (0/6) 6  Total 24  Adjusted Score (based on education) 24   6CIT Screen 12/12/2019  What Year? 0 points  What month? 0 points  What time? 0 points  Count back from 20 0 points  Months in reverse 0 points  Repeat phrase 0 points  Total Score 0    Immunizations Immunization History  Administered Date(s) Administered   Fluad Quad(high Dose 65+) 02/01/2019, 05/29/2020   Influenza, High Dose Seasonal PF 03/11/2015, 02/05/2016, 02/05/2017, 03/23/2018   Influenza,inj,quad, With Preservative 05/23/2014, 03/24/2018   PFIZER(Purple Top)SARS-COV-2 Vaccination 06/13/2019, 07/04/2019, 04/22/2020   Pneumococcal Conjugate-13 02/01/2019   Pneumococcal Polysaccharide-23 05/23/2014   Td 02/05/2016   Zoster, Live 05/24/2009    TDAP status: Up to date  Flu Vaccine status: Up to date  Pneumococcal vaccine status: Up to date  Covid-19 vaccine status: Information provided on how to obtain vaccines. Booster due  Qualifies for Shingles Vaccine? Yes   Zostavax completed Yes   Shingrix Completed?: No.    Education has been provided regarding the importance of this vaccine. Patient has been advised to call insurance company to determine out of pocket expense if they have not yet received this vaccine. Advised may also receive vaccine at local pharmacy or Health Dept. Verbalized acceptance and understanding.  Screening Tests Health Maintenance  Topic Date Due   Hepatitis C Screening  Never done   Zoster Vaccines- Shingrix (1 of 2) Never done   COVID-19 Vaccine (4 - Booster for Pfizer series) 07/21/2020   INFLUENZA VACCINE  12/22/2020    TETANUS/TDAP  02/04/2026   DEXA SCAN  Completed   PNA vac Low Risk Adult  Completed   HPV VACCINES  Aged Out    Health Maintenance  Health Maintenance Due  Topic Date Due   Hepatitis C Screening  Never done   Zoster Vaccines- Shingrix (1 of 2) Never done   COVID-19 Vaccine (4 - Booster for Pfizer series) 07/21/2020    Colorectal cancer screening: No longer required.   Mammogram status: Completed Bilateral 07/08/2020. Repeat every year  Bone Density status: Due-Declined today  Lung Cancer Screening: (Low Dose CT Chest recommended if Age 65-80 years, 30 pack-year currently smoking OR have quit w/in 15years.) does not qualify.     Additional Screening:  Hepatitis C Screening: does not qualify  Vision Screening: Recommended annual ophthalmology exams for early detection of glaucoma and other disorders of  the eye. Is the patient up to date with their annual eye exam?  Yes  Who is the provider or what is the name of the office in which the patient attends annual eye exams? Dr. Antionette Fairy   Dental Screening: Recommended annual dental exams for proper oral hygiene  Community Resource Referral / Chronic Care Management: CRR required this visit?  No   CCM required this visit?  No      Plan:     I have personally reviewed and noted the following in the patient's chart:   Medical and social history Use of alcohol, tobacco or illicit drugs  Current medications and supplements including opioid prescriptions.  Functional ability and status Nutritional status Physical activity Advanced directives List of other physicians Hospitalizations, surgeries, and ER visits in previous 12 months Vitals Screenings to include cognitive, depression, and falls Referrals and appointments  In addition, I have reviewed and discussed with patient certain preventive protocols, quality metrics, and best practice recommendations. A written personalized care plan for preventive services as  well as general preventive health recommendations were provided to patient.   Due to this being a telephonic visit, the after visit summary with patients personalized plan was offered to patient via mail or my-chart. Patient would like to access on my-chart.   Marta Antu, LPN   [id number]  Nurse Health Advisor  Nurse Notes: None

## 2020-12-19 NOTE — Patient Instructions (Signed)
Julie Wilkerson , Thank you for taking time to complete your Medicare Wellness Visit. I appreciate your ongoing commitment to your health goals. Please review the following plan we discussed and let me know if I can assist you in the future.   Screening recommendations/referrals: Colonoscopy: No longer required Mammogram: Completed 07/08/2020-Due 07/08/2021 Bone Density: Due -Declined today Recommended yearly ophthalmology/optometry visit for glaucoma screening and checkup Recommended yearly dental visit for hygiene and checkup  Vaccinations: Influenza vaccine: Up to date Pneumococcal vaccine: Up to date Tdap vaccine: Up to date-Due-02/04/2026 Shingles vaccine: Discuss with pharmacy   Covid-19:Booster due  Advanced directives: Per our conversation, you have a living will & a healthcare power of attorney but you do not wish to have them in your medical record.  Conditions/risks identified: See problem list  Next appointment: Follow up in one year for your annual wellness visit    Preventive Care 65 Years and Older, Female Preventive care refers to lifestyle choices and visits with your health care provider that can promote health and wellness. What does preventive care include? A yearly physical exam. This is also called an annual well check. Dental exams once or twice a year. Routine eye exams. Ask your health care provider how often you should have your eyes checked. Personal lifestyle choices, including: Daily care of your teeth and gums. Regular physical activity. Eating a healthy diet. Avoiding tobacco and drug use. Limiting alcohol use. Practicing safe sex. Taking low-dose aspirin every day. Taking vitamin and mineral supplements as recommended by your health care provider. What happens during an annual well check? The services and screenings done by your health care provider during your annual well check will depend on your age, overall health, lifestyle risk factors, and family  history of disease. Counseling  Your health care provider may ask you questions about your: Alcohol use. Tobacco use. Drug use. Emotional well-being. Home and relationship well-being. Sexual activity. Eating habits. History of falls. Memory and ability to understand (cognition). Work and work Statistician. Reproductive health. Screening  You may have the following tests or measurements: Height, weight, and BMI. Blood pressure. Lipid and cholesterol levels. These may be checked every 5 years, or more frequently if you are over 67 years old. Skin check. Lung cancer screening. You may have this screening every year starting at age 31 if you have a 30-pack-year history of smoking and currently smoke or have quit within the past 15 years. Fecal occult blood test (FOBT) of the stool. You may have this test every year starting at age 62. Flexible sigmoidoscopy or colonoscopy. You may have a sigmoidoscopy every 5 years or a colonoscopy every 10 years starting at age 72. Hepatitis C blood test. Hepatitis B blood test. Sexually transmitted disease (STD) testing. Diabetes screening. This is done by checking your blood sugar (glucose) after you have not eaten for a while (fasting). You may have this done every 1-3 years. Bone density scan. This is done to screen for osteoporosis. You may have this done starting at age 13. Mammogram. This may be done every 1-2 years. Talk to your health care provider about how often you should have regular mammograms. Talk with your health care provider about your test results, treatment options, and if necessary, the need for more tests. Vaccines  Your health care provider may recommend certain vaccines, such as: Influenza vaccine. This is recommended every year. Tetanus, diphtheria, and acellular pertussis (Tdap, Td) vaccine. You may need a Td booster every 10 years. Zoster vaccine. You  may need this after age 77. Pneumococcal 13-valent conjugate (PCV13)  vaccine. One dose is recommended after age 26. Pneumococcal polysaccharide (PPSV23) vaccine. One dose is recommended after age 27. Talk to your health care provider about which screenings and vaccines you need and how often you need them. This information is not intended to replace advice given to you by your health care provider. Make sure you discuss any questions you have with your health care provider. Document Released: 06/06/2015 Document Revised: 01/28/2016 Document Reviewed: 03/11/2015 Elsevier Interactive Patient Education  2017 [name] Prevention in the Home Falls can cause injuries. They can happen to people of all ages. There are many things you can do to make your home safe and to help prevent falls. What can I do on the outside of my home? Regularly fix the edges of walkways and driveways and fix any cracks. Remove anything that might make you trip as you walk through a door, such as a raised step or threshold. Trim any bushes or trees on the path to your home. Use bright outdoor lighting. Clear any walking paths of anything that might make someone trip, such as rocks or tools. Regularly check to see if handrails are loose or broken. Make sure that both sides of any steps have handrails. Any raised decks and porches should have guardrails on the edges. Have any leaves, snow, or ice cleared regularly. Use sand or salt on walking paths during winter. Clean up any spills in your garage right away. This includes oil or grease spills. What can I do in the bathroom? Use night lights. Install grab bars by the toilet and in the tub and shower. Do not use towel bars as grab bars. Use non-skid mats or decals in the tub or shower. If you need to sit down in the shower, use a plastic, non-slip stool. Keep the floor dry. Clean up any water that spills on the floor as soon as it happens. Remove soap buildup in the tub or shower regularly. Attach bath mats securely with  double-sided non-slip rug tape. Do not have throw rugs and other things on the floor that can make you trip. What can I do in the bedroom? Use night lights. Make sure that you have a light by your bed that is easy to reach. Do not use any sheets or blankets that are too big for your bed. They should not hang down onto the floor. Have a firm chair that has side arms. You can use this for support while you get dressed. Do not have throw rugs and other things on the floor that can make you trip. What can I do in the kitchen? Clean up any spills right away. Avoid walking on wet floors. Keep items that you use a lot in easy-to-reach places. If you need to reach something above you, use a strong step stool that has a grab bar. Keep electrical cords out of the way. Do not use floor polish or wax that makes floors slippery. If you must use wax, use non-skid floor wax. Do not have throw rugs and other things on the floor that can make you trip. What can I do with my stairs? Do not leave any items on the stairs. Make sure that there are handrails on both sides of the stairs and use them. Fix handrails that are broken or loose. Make sure that handrails are as long as the stairways. Check any carpeting to make sure that it is firmly  attached to the stairs. Fix any carpet that is loose or worn. Avoid having throw rugs at the top or bottom of the stairs. If you do have throw rugs, attach them to the floor with carpet tape. Make sure that you have a light switch at the top of the stairs and the bottom of the stairs. If you do not have them, ask someone to add them for you. What else can I do to help prevent falls? Wear shoes that: Do not have high heels. Have rubber bottoms. Are comfortable and fit you well. Are closed at the toe. Do not wear sandals. If you use a stepladder: Make sure that it is fully opened. Do not climb a closed stepladder. Make sure that both sides of the stepladder are locked  into place. Ask someone to hold it for you, if possible. Clearly mark and make sure that you can see: Any grab bars or handrails. First and last steps. Where the edge of each step is. Use tools that help you move around (mobility aids) if they are needed. These include: Canes. Walkers. Scooters. Crutches. Turn on the lights when you go into a dark area. Replace any light bulbs as soon as they burn out. Set up your furniture so you have a clear path. Avoid moving your furniture around. If any of your floors are uneven, fix them. If there are any pets around you, be aware of where they are. Review your medicines with your doctor. Some medicines can make you feel dizzy. This can increase your chance of falling. Ask your doctor what other things that you can do to help prevent falls. This information is not intended to replace advice given to you by your health care provider. Make sure you discuss any questions you have with your health care provider. Document Released: 03/06/2009 Document Revised: 10/16/2015 Document Reviewed: 06/14/2014 Elsevier Interactive Patient Education  2017 [employer].

## 2020-12-23 ENCOUNTER — Other Ambulatory Visit: Payer: Self-pay

## 2020-12-23 ENCOUNTER — Emergency Department (HOSPITAL_BASED_OUTPATIENT_CLINIC_OR_DEPARTMENT_OTHER): Payer: Medicare HMO

## 2020-12-23 ENCOUNTER — Emergency Department (HOSPITAL_BASED_OUTPATIENT_CLINIC_OR_DEPARTMENT_OTHER)
Admission: EM | Admit: 2020-12-23 | Discharge: 2020-12-23 | Disposition: A | Discharge status: Home / Self Care | Payer: Medicare HMO | Attending: Emergency Medicine | Admitting: Emergency Medicine

## 2020-12-23 ENCOUNTER — Encounter (HOSPITAL_BASED_OUTPATIENT_CLINIC_OR_DEPARTMENT_OTHER): Payer: Self-pay

## 2020-12-23 ENCOUNTER — Ambulatory Visit (INDEPENDENT_AMBULATORY_CARE_PROVIDER_SITE_OTHER): Payer: Medicare HMO | Admitting: Medical

## 2020-12-23 VITALS — BP 122/52 | HR 61 | Resp 18 | Ht <= 58 in | Wt 120.4 lb

## 2020-12-23 DIAGNOSIS — M79605 Pain in left leg: Secondary | ICD-10-CM | POA: Diagnosis not present

## 2020-12-23 DIAGNOSIS — R7989 Other specified abnormal findings of blood chemistry: Secondary | ICD-10-CM | POA: Diagnosis not present

## 2020-12-23 DIAGNOSIS — R0789 Other chest pain: Secondary | ICD-10-CM

## 2020-12-23 DIAGNOSIS — Z20822 Contact with and (suspected) exposure to covid-19: Secondary | ICD-10-CM | POA: Insufficient documentation

## 2020-12-23 DIAGNOSIS — R0609 Other forms of dyspnea: Secondary | ICD-10-CM

## 2020-12-23 DIAGNOSIS — E039 Hypothyroidism, unspecified: Secondary | ICD-10-CM | POA: Insufficient documentation

## 2020-12-23 DIAGNOSIS — I110 Hypertensive heart disease with heart failure: Secondary | ICD-10-CM | POA: Diagnosis not present

## 2020-12-23 DIAGNOSIS — J45909 Unspecified asthma, uncomplicated: Secondary | ICD-10-CM | POA: Insufficient documentation

## 2020-12-23 DIAGNOSIS — Z7982 Long term (current) use of aspirin: Secondary | ICD-10-CM | POA: Diagnosis not present

## 2020-12-23 DIAGNOSIS — R0600 Dyspnea, unspecified: Secondary | ICD-10-CM | POA: Diagnosis not present

## 2020-12-23 DIAGNOSIS — Z96642 Presence of left artificial hip joint: Secondary | ICD-10-CM | POA: Diagnosis not present

## 2020-12-23 DIAGNOSIS — R059 Cough, unspecified: Secondary | ICD-10-CM | POA: Diagnosis not present

## 2020-12-23 DIAGNOSIS — I5031 Acute diastolic (congestive) heart failure: Secondary | ICD-10-CM | POA: Diagnosis not present

## 2020-12-23 DIAGNOSIS — Z79899 Other long term (current) drug therapy: Secondary | ICD-10-CM | POA: Diagnosis not present

## 2020-12-23 DIAGNOSIS — M25561 Pain in right knee: Secondary | ICD-10-CM | POA: Diagnosis not present

## 2020-12-23 DIAGNOSIS — K449 Diaphragmatic hernia without obstruction or gangrene: Secondary | ICD-10-CM | POA: Diagnosis not present

## 2020-12-23 DIAGNOSIS — Z853 Personal history of malignant neoplasm of breast: Secondary | ICD-10-CM | POA: Insufficient documentation

## 2020-12-23 DIAGNOSIS — I2510 Atherosclerotic heart disease of native coronary artery without angina pectoris: Secondary | ICD-10-CM | POA: Diagnosis not present

## 2020-12-23 DIAGNOSIS — R079 Chest pain, unspecified: Secondary | ICD-10-CM | POA: Diagnosis not present

## 2020-12-23 DIAGNOSIS — M79661 Pain in right lower leg: Secondary | ICD-10-CM | POA: Diagnosis not present

## 2020-12-23 LAB — COMPREHENSIVE METABOLIC PANEL
ALT: 17 U/L (ref 0–44)
AST: 24 U/L (ref 15–41)
Albumin: 4 g/dL (ref 3.5–5.0)
Alkaline Phosphatase: 60 U/L (ref 38–126)
Anion gap: 10 (ref 5–15)
BUN: 16 mg/dL (ref 8–23)
CO2: 26 mmol/L (ref 22–32)
Calcium: 8.9 mg/dL (ref 8.9–10.3)
Chloride: 101 mmol/L (ref 98–111)
Creatinine, Ser: 1.08 mg/dL — ABNORMAL HIGH (ref 0.44–1.00)
GFR, Estimated: 52 mL/min — ABNORMAL LOW (ref >60)
Glucose, Bld: 96 mg/dL (ref 70–99)
Potassium: 4 mmol/L (ref 3.5–5.1)
Sodium: 137 mmol/L (ref 135–145)
Total Bilirubin: 0.4 mg/dL (ref 0.3–1.2)
Total Protein: 7.2 g/dL (ref 6.5–8.1)

## 2020-12-23 LAB — CBC WITH DIFFERENTIAL/PLATELET
Abs Immature Granulocytes: 0.04 10*3/uL (ref 0.00–0.07)
Basophils Absolute: 0 10*3/uL (ref 0.0–0.1)
Basophils Relative: 0 %
Eosinophils Absolute: 0 10*3/uL (ref 0.0–0.5)
Eosinophils Relative: 0 %
HCT: 34 % — ABNORMAL LOW (ref 36.0–46.0)
Hemoglobin: 11.3 g/dL — ABNORMAL LOW (ref 12.0–15.0)
Immature Granulocytes: 1 %
Lymphocytes Relative: 32 %
Lymphs Abs: 1.7 10*3/uL (ref 0.7–4.0)
MCH: 29.8 pg (ref 26.0–34.0)
MCHC: 33.2 g/dL (ref 30.0–36.0)
MCV: 89.7 fL (ref 80.0–100.0)
Monocytes Absolute: 0.8 10*3/uL (ref 0.1–1.0)
Monocytes Relative: 14 %
Neutro Abs: 2.8 10*3/uL (ref 1.7–7.7)
Neutrophils Relative %: 53 %
Platelets: 239 10*3/uL (ref 150–400)
RBC: 3.79 MIL/uL — ABNORMAL LOW (ref 3.87–5.11)
RDW: 14.5 % (ref 11.5–15.5)
WBC: 5.3 10*3/uL (ref 4.0–10.5)
nRBC: 0 % (ref 0.0–0.2)

## 2020-12-23 LAB — RESP PANEL BY RT-PCR (FLU A&B, COVID) ARPGX2
Influenza A by PCR: NEGATIVE
Influenza B by PCR: NEGATIVE
SARS Coronavirus 2 by RT PCR: NEGATIVE

## 2020-12-23 LAB — BRAIN NATRIURETIC PEPTIDE: B Natriuretic Peptide: 190.5 pg/mL — ABNORMAL HIGH (ref 0.0–100.0)

## 2020-12-23 LAB — TROPONIN I (HIGH SENSITIVITY): Troponin I (High Sensitivity): 6 ng/L (ref <18)

## 2020-12-23 MED ORDER — IOHEXOL 350 MG/ML SOLN
100.0000 mL | Freq: Once | INTRAVENOUS | Status: AC | PRN
  Administered 2020-12-23: 75 mL via INTRAVENOUS

## 2020-12-23 NOTE — Progress Notes (Signed)
Subjective:    Patient ID: Julie Wilkerson, female    DOB: [birth year], 80 y.o.   MRN: [id number]  HPI  Pt in for evaluation. Pt state recently while out of town. She was seen in [place]. Weather was very cold there.   Pt was coughing while in [place]. Pt has history of asthma and she was wheezing. Pt was given prednisone. Symptoms did not resolve.   Seen by MD in [place] on 22 nd. She saw a pulmonlogist. After below labs told to be seen asap by provider in [name] on arrival.  Pt had elevated sed rate of 33. Lab range.  Pt also had elevated d dimer in [place]. The bnp was elevated at 315. D dimer was 1563 ng/ml. Upper range of that lab is 500 ng/ml.   Pt had chest xray in [place]. She did not get ct. No ekg done.  Pt has some pressure in chest nights ago. When pt walks she feels short of breath. Some intermittent left arm  pain.   Pt returned from [place] on 12-16-2020. Also recent flight to [place].     Review of Systems  Constitutional:  Negative for chills, fatigue and fever.  HENT:  Negative for congestion.   Respiratory:  Positive for cough and shortness of breath. Negative for wheezing.   Cardiovascular:  Positive for chest pain. Negative for palpitations and leg swelling.       2 nights ago  Musculoskeletal:  Negative for back pain.  Neurological:  Negative for dizziness, light-headedness and headaches.  Hematological:  Negative for adenopathy. Does not bruise/bleed easily.  Psychiatric/Behavioral:  Negative for behavioral problems.     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 lumpectomy, radtiation x 30   Breast cancer in female [facility])    Bursitis of left hip 09/04/2018   Change in bowel habits 02/22/2018   Change in mole 06/08/2016   Chest pain 03/04/2020   CHF (congestive heart failure) ([facility])    Chronic bilateral thoracic back pain 05/23/2016   Chronic diastolic congestive heart failure ([facility]) 05/28/2019   Colitis    Complication of anesthesia    VERY SENSITIVE TO ANESTHESIA    Constipation 01/17/2018   Coronary artery disease involving native coronary artery of native heart without angina pectoris 05/28/2019   Cortical age-related cataract of right eye 04/18/2018   Cough variant asthma  vs UACS from ACEi     Spirometry 12/02/2016  FEV1 1.26 (76%)  Ratio 78 with min curvature p last saba > 6 h prior  - trial off acei 12/02/2016  - 12/02/2016  After extensive coaching HFA effectiveness =    75% with qvar autohaler > rechallenge with 80 2bid     Decreased visual acuity 01/16/2017   Degeneration of lumbar intervertebral disc 08/01/2018   Diarrhea 11/16/2015   D/o Diverticuli, polyps, celiac disease.     Dyspnea 11/04/2016   with asthma exacerbation only  Dysuria 11/04/2016   Elevated sed rate 10/23/2017   Essential hypertension    Trial off acei 12/02/2016 due to cough/ pseudoasthma   Gluten intolerance    History of chicken pox    History of Helicobacter pylori infection 08/01/2015   History of rectal bleeding 06/01/2019   Hx of LASIK 04/24/2019   Hyperglycemia 11/07/2016   Hyperlipidemia    Hypertension    Hyponatremia 02/16/2019   Hypothyroidism    IBS (irritable bowel syndrome)    Lattice degeneration of right retina 04/24/2019   Left hand pain 05/07/2018   Left hip pain 03/01/2017   Left leg pain 03/01/2017   Left shoulder pain 05/07/2018   Low back pain 08/10/2015   LUQ pain 06/01/2019   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   Neck pain 03/01/2017   Nuclear sclerotic cataract of right eye 04/18/2018   Osteoporosis    Overweight (BMI 25.0-29.9) 02/14/2019   Pain in joint of left shoulder 08/11/2018   Paroxysmal atrial tachycardia ([place]) 05/28/2019   Pelvic pain 07/02/2018   Personal history of radiation therapy    Pneumonia    PONV (postoperative nausea and vomiting)    Poor appetite 02/22/2018   Pseudophakia of left eye 04/18/2018   PVD (posterior vitreous detachment), right 04/24/2019   Restless sleeper 11/07/2016   Right hip pain 10/23/2017   RLQ discomfort 03/01/2017   RLS (restless legs syndrome) 05/07/2019   S/P left THA, AA 02/13/2019   Sepsis due to pneumonia ([facility]) 02/16/2019   Stomach cramps    Tremor 02/15/2016   Urinary frequency 12/22/2017   Urinary incontinence 05/07/2019   Urinary tract infection 11/04/2016   Vitamin D deficiency 04/29/2016     Social History   Socioeconomic History   Marital status: Single    Spouse name: Not on file   Number of children: 3   Years of education: 18   Highest education level: Master's degree (e.g., MA, MS, MEng, MEd, MSW, MBA)  Occupational History   Occupation: retired    Comment: Pharmacist, hospital, kindergarten  Tobacco Use   Smoking status: Never   Smokeless tobacco: Never  Vaping Use   Vaping Use: Never used  Substance and Sexual Activity   Alcohol use: Never    Alcohol/week: 0.0 standard drinks   Drug use: Never   Sexual activity: Not Currently    Birth control/protection: None    Comment: lives alone, avoids daiiry and gluten. 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  Other Topics Concern   Not on file  Social History Narrative   Not on file   Social Determinants of Health   Financial Resource Strain: Low Risk    Difficulty of Paying Living Expenses: Not hard at all  Food Insecurity: No Food Insecurity   Worried About Charity fundraiser in the Last Year: Never true   [place] in the Last Year: Never true  Transportation  Needs: No Transportation Needs   Lack of Transportation (Medical): No   Lack of Transportation (Non-Medical): No  Physical Activity: Insufficiently Active   Days of Exercise per Week: 1 day   Minutes of Exercise per Session: 30 min  Stress: No Stress Concern Present   Feeling of Stress : Not at all  Social Connections: Not on file  Intimate Partner Violence: Not At Risk   Fear of Current or Ex-Partner: No   Emotionally Abused: No   Physically Abused: No   Sexually Abused: No    Past Surgical  History:  Procedure Laterality Date   ABDOMINAL HYSTERECTOMY     partial   APPENDECTOMY     BREAST LUMPECTOMY Left 2002   CHOLECYSTECTOMY  Age 80 or 15   COLONOSCOPY  2017   EYE SURGERY Left    cataract   LEFT HEART CATH AND CORONARY ANGIOGRAPHY N/A 02/09/2019   Procedure: LEFT HEART CATH AND CORONARY ANGIOGRAPHY;  Surgeon: Nelva Bush, MD;  Location: [facility];  Service: Cardiovascular;  Laterality: N/A;   TONSILLECTOMY     TOTAL HIP ARTHROPLASTY Left 02/13/2019   Procedure: TOTAL HIP ARTHROPLASTY ANTERIOR APPROACH;  Surgeon: Paralee Cancel, MD;  Location: [facility];  Service: Orthopedics;  Laterality: Left;  70 mins    Family History  Problem Relation Age of Onset   Colitis Mother    Irritable bowel syndrome Mother    Hypertension Father    Hyperlipidemia Brother    Heart attack Brother    Stomach cancer Paternal Grandmother 52   Colon cancer Paternal Grandmother    Hyperlipidemia Brother    Heart attack Brother    Hyperlipidemia Brother    Heart attack Brother    Hyperlipidemia Sister    Leukemia Daughter    Arthritis Daughter    Heart disease Daughter        ASD vs VSD   Esophageal cancer Neg Hx     Not on File  Current Outpatient Medications on File Prior to Visit  Medication Sig Dispense Refill   albuterol (VENTOLIN HFA) 108 (90 Base) MCG/ACT inhaler Inhale 2 puffs into the lungs every 6 (six) hours as needed for wheezing or shortness of breath. 18 g 2    ASPIRIN 81 PO 81 mg daily.      atorvastatin (LIPITOR) 20 MG tablet Take 1 tablet by mouth once daily 90 tablet 0   citalopram (CELEXA) 20 MG tablet Take 1 tablet (20 mg total) by mouth at bedtime. 90 tablet 1   fluticasone (FLONASE) 50 MCG/ACT nasal spray Place 2 sprays into both nostrils daily as needed for allergies or rhinitis. 16 g 5   furosemide (LASIX) 40 MG tablet Take 1 tablet (40 mg total) by mouth daily. 90 tablet 1   hyoscyamine (LEVSIN SL) 0.125 MG SL tablet Place 1 tablet (0.125 mg total) under the tongue 2 (two) times daily as needed. 30 tablet 1   Iron, Ferrous Sulfate, 325 (65 Fe) MG TABS Take 325 mg by mouth daily. 30 tablet 3   levothyroxine (SYNTHROID) 50 MCG tablet Take 1 tablet (50 mcg total) by mouth daily before breakfast. 90 tablet 1   Lidocaine 2 % GEL Apply 1 Dose topically 2 (two) times daily as needed. 28.33 g 1   lisinopril (ZESTRIL) 10 MG tablet Take 1 tablet (10 mg total) by mouth 2 (two) times daily as needed. 180 tablet 1   metoprolol succinate (TOPROL-XL) 25 MG 24 hr tablet TAKE 1 TABLET BY MOUTH AT BEDTIME 90 tablet 0   omega-3 acid ethyl esters (LOVAZA) 1 g capsule Take 2 g by mouth daily.     omeprazole (PRILOSEC) 40 MG capsule Take 1 capsule (40 mg total) by mouth in the morning and at bedtime. Take 1 tablet by mouth once to twice daily 60 capsule 5   ondansetron (ZOFRAN) 4 MG tablet Dissolve 1 tablet under tongue once to twice a day, every day 90 tablet 3   potassium chloride (KLOR-CON) 10 MEQ tablet Take 1 tablet by mouth once daily 90 tablet 1   sucralfate (  CARAFATE) 1 g tablet Take 1 tablet (1 g total) by mouth 2 (two) times daily. 60 tablet 0   No current facility-administered medications on file prior to visit.    BP (!) 122/52   Pulse 61   Resp 18   Ht '4\' 10"'$  (1.473 m)   Wt 120 lb 6.4 oz (54.6 kg)   SpO2 96%   BMI 25.16 kg/m       Objective:   Physical Exam  General Mental Status- Alert. General Appearance- Not in acute distress.    Skin General: Color- Normal Color. Moisture- Normal Moisture.  Neck Carotid Arteries- Normal color. Moisture- Normal Moisture. No carotid bruits. No JVD.  Chest and Lung Exam Auscultation: Breath Sounds:-Normal.  Cardiovascular Auscultation:Rythm- Regular. Murmurs & Other Heart Sounds:Auscultation of the heart reveals- No Murmurs.  Abdomen Inspection:-Inspeection Normal. Palpation/Percussion:Note:No mass. Palpation and Percussion of the abdomen reveal- Non Tender, Non Distended + BS, no rebound or guarding.    Neurologic Cranial Nerve exam:- CN III-XII intact(No nystagmus), symmetric smile. Strength:- 5/5 equal and symmetric strength both upper and lower extremities.   Rt lower ext- positive homans sign on exam. Calf mild swollen.    Assessment & Plan:   Recent symptoms of cough, shortness of breath, chest pain arm pain and rt side popliteal pain on exam. While in [place] labs elevated d dimer, elevated bnp and instructed to be seen asap on return to [name].  In addition to above long airflights on return to [name]. Chest pain 2 night ago with arm pain.  Ekg today sinus bradycardia.  Based on abnormal labs, recent symptoms and popliteal pain/+homans sign to recommend ED evaluation.  May get repeat d dimer, bnp, imaging studies, troponin and other labs.  Follow with [name] to be determined after ED evaluation.  Did call down stairs to notify ED of pt presentnation  Mackie Pai, PA-C   Time spent with patient today was 42 minutes which consisted of chart review, discussing  differential diagnosis, work up ,treatment ,need for ED evaluation and documentation.

## 2020-12-23 NOTE — ED Notes (Signed)
Patient reports increasing shortness of breath over last month, with dyspnea on exertion and when lying flat,  now with cough.  Patient recently in [place] and told D-dimer and BNP were both elevated and to have them checked upon return to [name].

## 2020-12-23 NOTE — Discharge Instructions (Addendum)
Increase your Lasix from 40 mg once per day to 40 mg twice per day for the next 3 days and see if this helps her shortness of breath.  If at any point you develop fever, coughing up blood, new or worsening trouble breathing, chest pain, or any other new/concerning symptoms then return to the ER for evaluation.

## 2020-12-23 NOTE — ED Notes (Signed)
Discharge instructions discussed with pt. Pt verbalized understanding. Pt stable and ambulatory.  °

## 2020-12-23 NOTE — ED Triage Notes (Signed)
Pt c/o cough, chest pain and dyspnea on exertion & lying flat. Recent bloodwork showed elevated D-dimer and elevated BNP. Recent travel to [place]. States symptoms have persisted the past 2 months.

## 2020-12-23 NOTE — Patient Instructions (Addendum)
Recent symptoms of cough, shortness of breath, chest pain arm pain and rt side popliteal pain on exam. While in [place] labs elevated d dimer, elevated bnp and instructed to be seen asap on return to [name].  In addition to above long airflights on return to [name]. Chest pain 2 night ago with arm pain.  Ekg today sinus bradycardia.  Based on abnormal labs, recent symptoms and popliteal pain/+homans sign to recommend ED evaluation.  May get repeat d dimer, bnp, imaging studies, troponin and other labs.  Follow with [name] to be determined after ED evaluation.  Did call down stairs to notify ED of pt presentation.

## 2020-12-23 NOTE — ED Provider Notes (Signed)
[facility] EMERGENCY DEPARTMENT Provider Note   CSN: [id number] Arrival date & time: 12/23/20  1037     History Chief Complaint  Patient presents with   Shortness of Breath   Chest Pain    Julie Wilkerson is a 80 y.o. female.  HPI 80 year old female presents with dyspnea and cough.  She has been having the symptoms for least 2 months.  Cough is nonproductive.  Some dyspnea with exertion and laying flat.  She is also had some chest pressure on and off.  Has heard some wheezing here and there and the albuterol inhaler seems to help.  She was given prednisone a while ago and this temporarily gave her relief but then her symptoms came back.  She has been traveling to [place], [place] and just got back.  Over the last few days she is had some right popliteal pain and anterior lower leg discomfort and swelling.  She states she has some blood work there because she was not feeling well and they noted an elevated D-dimer and elevated BNP.  No imaging was performed.  Past Medical History:  Diagnosis Date   Abdominal pain 11/07/2016   Accelerating angina ([place]) 08/14/4008   Acute diastolic CHF (congestive heart failure) ([place]) 02/16/2019   Acute on chronic diastolic CHF (congestive heart failure) ([place]) 02/23/2019   Acute pansinusitis 04/26/2017   Amblyopia of eye, left 04/19/2018   Anemia    Anginal pain (HCC)    Anxiety    Arthritis    Asthma    Atypical chest pain 02/04/2019   Bilateral carotid bruits 03/28/2017   Blood transfusion without reported diagnosis    Breast cancer ([place])    2002, left, encapsulated, microcalcifications. lumpectomy, radtiation x 30   Breast cancer in female [facility])    Bursitis of left hip 09/04/2018   Change in bowel habits 02/22/2018   Change in mole 06/08/2016   Chest pain 03/04/2020   CHF (congestive heart failure) (HCC)    Chronic bilateral thoracic back pain 05/23/2016   Chronic diastolic congestive heart failure ([place]) 05/28/2019   Colitis     Complication of anesthesia    VERY SENSITIVE TO ANESTHESIA    Constipation 01/17/2018   Coronary artery disease involving native coronary artery of native heart without angina pectoris 05/28/2019   Cortical age-related cataract of right eye 04/18/2018   Cough variant asthma  vs UACS from ACEi     Spirometry 12/02/2016  FEV1 1.26 (76%)  Ratio 78 with min curvature p last saba > 6 h prior  - trial off acei 12/02/2016  - 12/02/2016  After extensive coaching HFA effectiveness =    75% with qvar autohaler > rechallenge with 80 2bid     Decreased visual acuity 01/16/2017   Degeneration of lumbar intervertebral disc 08/01/2018   Diarrhea 11/16/2015   D/o Diverticuli, polyps, celiac disease.     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   Left  hand pain 05/07/2018   Left hip pain 03/01/2017   Left leg pain 03/01/2017   Left shoulder pain 05/07/2018   Low back pain 08/10/2015   LUQ pain 06/01/2019   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   Neck pain 03/01/2017   Nuclear sclerotic cataract of right eye 04/18/2018   Osteoporosis    Overweight (BMI 25.0-29.9) 02/14/2019   Pain in joint of left shoulder 08/11/2018   Paroxysmal atrial tachycardia (HCC) 05/28/2019   Pelvic pain 07/02/2018   Personal history of radiation therapy    Pneumonia    PONV (postoperative nausea and vomiting)    Poor appetite 02/22/2018   Pseudophakia of left eye 04/18/2018   PVD (posterior vitreous  detachment), right 04/24/2019   Restless sleeper 11/07/2016   Right hip pain 10/23/2017   RLQ discomfort 03/01/2017   RLS (restless legs syndrome) 05/07/2019   S/P left THA, AA 02/13/2019   Sepsis due to pneumonia ([place]) 02/16/2019   Stomach cramps    Tremor 02/15/2016   Urinary frequency 12/22/2017   Urinary incontinence 05/07/2019   Urinary tract infection 11/04/2016   Vitamin D deficiency 04/29/2016    Patient Active Problem List   Diagnosis Date Noted   Dark stools 09/15/2020   Back pain, chronic 06/24/2020   Sleep apnea 06/24/2020   Stomach cramps    Blood transfusion without reported diagnosis    PONV (postoperative nausea and vomiting)    Personal history of radiation therapy    Hypertension    Complication of anesthesia    Colitis    Malignant neoplasm of female breast ([place])    Asthma    Anxiety    Bilateral hand pain    Dysuria 02/04/2020   History of rectal bleeding 06/01/2019   LUQ pain 06/01/2019   Chronic diastolic congestive heart failure ([place]) 05/28/2019   Paroxysmal atrial tachycardia ([place]) 05/28/2019   Coronary artery disease involving native coronary artery of native heart without angina pectoris 05/28/2019   Urinary incontinence 05/07/2019   RLS (restless legs syndrome) 05/07/2019   Hx of LASIK 04/24/2019   Lattice degeneration of right retina 04/24/2019   PVD (posterior vitreous detachment), right 04/24/2019   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   CHF (congestive heart failure) ([place]) 02/23/2019   Sepsis due to pneumonia ([place]) 02/16/2019   Hyponatremia 02/16/2019   Overweight (BMI 25.0-29.9) 02/14/2019   History of repair of hip joint 02/13/2019   Accelerating angina ([place]) 02/09/2019   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   Atypical chest pain 02/04/2019   Bursitis of left hip 09/04/2018   Pain in joint of left shoulder 08/11/2018   Degeneration of lumbar intervertebral disc 08/01/2018   Pelvic pain 07/02/2018   Shoulder pain  05/07/2018   Left hand pain 05/07/2018   Pain of left hand 05/07/2018   Amblyopia of eye, left 04/19/2018   Cortical age-related cataract of right eye 04/18/2018   Nuclear sclerotic cataract of right eye 04/18/2018   Pseudophakia of left eye 04/18/2018   Change in bowel habits 02/22/2018   Nausea and vomiting 02/22/2018   Decrease in appetite 02/22/2018   Anemia 01/17/2018   Constipation 01/17/2018   Increased frequency of urination 12/22/2017   Right hip pain 10/23/2017   ESR raised 10/23/2017   Influenza 09/04/2017   Acute pansinusitis 04/26/2017   Bronchitis 04/26/2017   Carotid bruit 03/28/2017   Amnesia 03/28/2017   Pain in pelvis 03/01/2017   Neck pain on left side 03/01/2017  Right lower quadrant pain 03/01/2017   Left leg pain 03/01/2017   Neck pain 03/01/2017   Reduced visual acuity 01/16/2017   Attention deficit hyperactivity disorder, 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   Hyperglycemia 11/07/2016   Restless sleeper 11/07/2016   Abdominal pain 11/07/2016   Urinary tract infectious disease 11/04/2016   Dyspnea 11/04/2016   Change in mole 06/08/2016   Chronic thoracic back pain 05/23/2016   Vitamin D deficiency 04/29/2016   Tremor 02/15/2016   Diarrhea 11/16/2015   Low back pain 85/27/7824   History of Helicobacter pylori infection 08/01/2015   Cough variant asthma  vs UACS from ACEi     Breast cancer in female [facility])    Hyperlipidemia    Essential hypertension    Osteoporosis    Hypothyroid    Irritable bowel syndrome    Gluten intolerance    History of chickenpox     Past Surgical History:  Procedure Laterality Date   ABDOMINAL HYSTERECTOMY     partial   APPENDECTOMY     BREAST LUMPECTOMY Left 2002   CHOLECYSTECTOMY  Age 76 or 41   COLONOSCOPY  2017   EYE SURGERY Left    cataract   LEFT HEART CATH AND CORONARY ANGIOGRAPHY N/A 02/09/2019   Procedure: LEFT HEART CATH AND CORONARY ANGIOGRAPHY;  Surgeon: Nelva Bush, MD;  Location: [facility];  Service: Cardiovascular;  Laterality: N/A;   TONSILLECTOMY     TOTAL HIP ARTHROPLASTY Left 02/13/2019   Procedure: TOTAL HIP ARTHROPLASTY ANTERIOR APPROACH;  Surgeon: Paralee Cancel, MD;  Location: [facility];  Service: Orthopedics;  Laterality: Left;  70 mins     OB History     Gravida  4   Para  3   Term  3   Preterm      AB  1   Living  3      SAB  1   IAB      Ectopic      Multiple      Live Births  3           Family History  Problem Relation Age of Onset   Colitis Mother    Irritable bowel syndrome Mother    Hypertension Father    Hyperlipidemia Brother    Heart attack Brother    Stomach cancer Paternal Grandmother 37   Colon cancer Paternal Grandmother    Hyperlipidemia Brother    Heart attack Brother    Hyperlipidemia Brother    Heart attack Brother    Hyperlipidemia Sister    Leukemia Daughter    Arthritis Daughter    Heart disease Daughter        ASD vs VSD   Esophageal cancer Neg Hx     Social History   Tobacco Use   Smoking status: Never   Smokeless tobacco: Never  Vaping Use   Vaping Use: Never used  Substance Use Topics   Alcohol use: Never    Alcohol/week: 0.0 standard drinks   Drug use: Never    Home Medications Prior to Admission medications   Medication Sig Start Date End Date Taking? Authorizing Provider  albuterol (VENTOLIN HFA) 108 (90 Base) MCG/ACT inhaler Inhale 2 puffs into the lungs every 6 (six) hours as needed for wheezing or shortness of breath. 02/26/19   [name], MD  ASPIRIN 81 PO 81 mg daily.     [provider]  atorvastatin (LIPITOR) 20 MG tablet Take 1 tablet  by mouth once daily 11/18/20   Tobb, Kardie, DO  citalopram (CELEXA) 20 MG tablet Take 1 tablet (20 mg total) by mouth at bedtime. 07/02/20   [name], MD  fluticasone (FLONASE) 50 MCG/ACT nasal spray Place 2 sprays into both nostrils daily as needed for allergies or rhinitis. 09/18/19    [name], MD  furosemide (LASIX) 40 MG tablet Take 1 tablet (40 mg total) by mouth daily. 07/02/20   [name], MD  hyoscyamine (LEVSIN SL) 0.125 MG SL tablet Place 1 tablet (0.125 mg total) under the tongue 2 (two) times daily as needed. 10/09/20   [name], MD  Iron, Ferrous Sulfate, 325 (65 Fe) MG TABS Take 325 mg by mouth daily. 09/17/20   [name], MD  levothyroxine (SYNTHROID) 50 MCG tablet Take 1 tablet (50 mcg total) by mouth daily before breakfast. 10/06/20   [name], MD  Lidocaine 2 % GEL Apply 1 Dose topically 2 (two) times daily as needed. 09/15/20   [name], MD  lisinopril (ZESTRIL) 10 MG tablet Take 1 tablet (10 mg total) by mouth 2 (two) times daily as needed. 10/07/20   [name], MD  metoprolol succinate (TOPROL-XL) 25 MG 24 hr tablet TAKE 1 TABLET BY MOUTH AT BEDTIME 08/18/20   [name], MD  omega-3 acid ethyl esters (LOVAZA) 1 g capsule Take 2 g by mouth daily.    [provider]  omeprazole (PRILOSEC) 40 MG capsule Take 1 capsule (40 mg total) by mouth in the morning and at bedtime. Take 1 tablet by mouth once to twice daily 11/20/19   Armbruster, Carlota Raspberry, MD  ondansetron [facility]) 4 MG tablet Dissolve 1 tablet under tongue once to twice a day, every day 04/22/20   Armbruster, Carlota Raspberry, MD  potassium chloride (KLOR-CON) 10 MEQ tablet Take 1 tablet by mouth once daily 04/09/20   [name], MD  sucralfate (CARAFATE) 1 g tablet Take 1 tablet (1 g total) by mouth 2 (two) times daily. 11/16/19   Saguier, Percell Miller, PA-C    Allergies    Patient has no known allergies.  Review of Systems   Review of Systems  Constitutional:  Negative for fever.  Respiratory:  Positive for cough, shortness of breath and wheezing.   Cardiovascular:  Positive for chest pain and leg swelling.  All other systems reviewed and are negative.  Physical Exam Updated Vital Signs BP 128/68   Pulse (!) 53   Temp 97.9 F (36.6 C) (Oral)   Resp 16    Ht _0  (1.473 m)   Wt 54.6 kg   SpO2 96%   BMI 25.16 kg/m   Physical Exam Vitals and nursing note reviewed.  Constitutional:      Appearance: She is well-developed.  HENT:     Head: Normocephalic and atraumatic.     Right Ear: External ear normal.     Left Ear: External ear normal.     Nose: Nose normal.  Eyes:     General:        Right eye: No discharge.        Left eye: No discharge.  Cardiovascular:     Rate and Rhythm: Normal rate and regular rhythm.     Heart sounds: Normal heart sounds.  Pulmonary:     Effort: Pulmonary effort is normal.     Breath sounds: Normal breath sounds.  Abdominal:     Palpations: Abdomen is soft.  Tenderness: There is no abdominal tenderness.  Musculoskeletal:     Right lower leg: No edema.     Left lower leg: No edema.     Comments: Some mild tenderness to right popliteal knee. No calf swelling  Skin:    General: Skin is warm and dry.  Neurological:     Mental Status: She is alert.  Psychiatric:        Mood and Affect: Mood is not anxious.    ED Results / Procedures / Treatments   Labs (all labs ordered are listed, but only abnormal results are displayed) Labs Reviewed  COMPREHENSIVE METABOLIC PANEL - Abnormal; Notable for the following components:      Result Value   Creatinine, Ser 1.08 (*)    Total Protein 5.2 (*)    Albumin 2.1 (*)    GFR, Estimated 52 (*)    All other components within normal limits  BRAIN NATRIURETIC PEPTIDE - Abnormal; Notable for the following components:   B Natriuretic Peptide 190.5 (*)    All other components within normal limits  CBC WITH DIFFERENTIAL/PLATELET - Abnormal; Notable for the following components:   RBC 3.79 (*)    Hemoglobin 11.3 (*)    HCT 34.0 (*)    All other components within normal limits  RESP PANEL BY RT-PCR (FLU A&B, COVID) [id number]  TROPONIN I (HIGH SENSITIVITY)    EKG EKG Interpretation  Date/Time:  Tuesday December 23 2020 10:50:59 EDT Ventricular Rate:  53 PR  Interval:  160 QRS Duration: 85 QT Interval:  425 QTC Calculation: 399 R Axis:   12 Text Interpretation: Sinus rhythm nonspecific ST/T changes similar to Nov 2021 Confirmed by Sherwood Gambler [phone]) on 12/23/2020 10:59:47 AM  Radiology DG Chest 2 View  Result Date: 12/23/2020 CLINICAL DATA:  Cough, dyspnea, chest pain EXAM: CHEST - 2 VIEW COMPARISON:  04/02/2020 FINDINGS: The heart size and mediastinal contours are within normal limits. Both lungs are clear. Disc degenerative disease of the thoracic spine. IMPRESSION: No acute abnormality of the lungs. Electronically Signed   By: Eddie Candle M.D.   On: 12/23/2020 12:39   CT Angio Chest PE W and/or Wo Contrast  Result Date: 12/23/2020 CLINICAL DATA:  Persistent cough for 2 months, chest and back pain EXAM: CT ANGIOGRAPHY CHEST WITH CONTRAST TECHNIQUE: Multidetector CT imaging of the chest was performed using the standard protocol during bolus administration of intravenous contrast. Multiplanar CT image reconstructions and MIPs were obtained to evaluate the vascular anatomy. CONTRAST:  19m OMNIPAQUE IOHEXOL 350 MG/ML SOLN COMPARISON:  03/04/2020 FINDINGS: Cardiovascular: Pulmonary arteries appear patent and are normal in caliber. Negative for significant acute filling defect or pulmonary embolus by CTA. Minor aortic atherosclerosis. Negative for aneurysm or dissection. No focal wall thickening or acute vascular process. No mediastinal hemorrhage or hematoma. Normal heart size.  No pericardial effusion. Central venous structures are patent.  No veno-occlusive process. Mediastinum/Nodes: Thyroid unremarkable. Trachea and central airways are patent. Esophagus nondilated. No bulky adenopathy. Moderate hiatal hernia noted, larger than October 12 21. Lungs/Pleura: Diffuse bilateral patchy ground-glass opacities involving all lobes of both lungs, nonspecific for early edema, alveolitis, mild pneumonitis, or atypical or viral infection. No significant collapse or  consolidation. No focal nodule or mass. No pleural abnormality, effusion, or pneumothorax. Upper Abdomen: No acute abnormality. Musculoskeletal: Degenerative changes noted spine. No acute osseous finding. Sternum intact. Review of the MIP images confirms the above findings. IMPRESSION: Negative for significant acute pulmonary embolus by CTA. No other acute intrathoracic vascular finding.  Diffuse bilateral patchy ground-glass opacities, nonspecific. See above comment. Moderate hiatal hernia Aortic Atherosclerosis (ICD10-I70.0). Electronically Signed   By: Jerilynn Mages.  Shick M.D.   On: 12/23/2020 13:22   US Venous Img Lower Right (DVT Study)  Result Date: 12/23/2020 CLINICAL DATA:  80 year old female with right popliteal pain, recent travel, concern for deep vein thrombosis. EXAM: RIGHT LOWER EXTREMITY VENOUS DOPPLER ULTRASOUND TECHNIQUE: Gray-scale sonography with graded compression, as well as color Doppler and duplex ultrasound were performed to evaluate the right lower extremity deep venous systems from the level of the common femoral vein and including the common femoral, femoral, profunda femoral, popliteal and calf veins including the posterior tibial, peroneal and gastrocnemius veins when visible. Spectral Doppler was utilized to evaluate flow at rest and with distal augmentation maneuvers in the common femoral, femoral and popliteal veins. The contralateral common femoral vein was also evaluated for comparison. COMPARISON:  None. FINDINGS: RIGHT LOWER EXTREMITY Common Femoral Vein: No evidence of thrombus. Normal compressibility, respiratory phasicity and response to augmentation. Central Greater Saphenous Vein: No evidence of thrombus. Normal compressibility and flow on color Doppler imaging. Central Profunda Femoral Vein: No evidence of thrombus. Normal compressibility and flow on color Doppler imaging. Femoral Vein: No evidence of thrombus. Normal compressibility, respiratory phasicity and response to  augmentation. Popliteal Vein: No evidence of thrombus. Normal compressibility, respiratory phasicity and response to augmentation. Calf Veins: No evidence of thrombus. Normal compressibility and flow on color Doppler imaging. Other Findings:  None. LEFT LOWER EXTREMITY Common Femoral Vein: No evidence of thrombus. Normal compressibility, respiratory phasicity and response to augmentation. IMPRESSION: No evidence of right lower extremity deep venous thrombosis. Ruthann Cancer, MD [facility] Radiology Electronically Signed   By: Ruthann Cancer MD   On: 12/23/2020 12:25    Procedures Procedures   Medications Ordered in ED Medications  iohexol (OMNIPAQUE) 350 MG/ML injection 100 mL (75 mLs Intravenous Contrast Given 12/23/20 1245)    ED Course  I have reviewed the triage vital signs and the nursing notes.  Pertinent labs & imaging results that were available during my care of the patient were reviewed by me and considered in my medical decision making (see chart for details).    MDM Rules/Calculators/A&P                           Patient has had symptoms for months.  Given the elevated D-dimer upon check at an outside clinic, a CT was ultimately obtained.  There is no DVT or PE.  Labs are pretty benign besides a nonspecific BNP.  Troponin is negative.  I do wonder with the CT findings if she has a low level CHF exacerbation.  We will have her double her Lasix for the next couple days and have her follow-up with her PCP.  However she is not hypoxic or in distress.  Given the length of time of symptoms I think infection is less likely. Final Clinical Impression(s) / ED Diagnoses Final diagnoses:  Acute diastolic congestive heart failure [facility])    Rx / DC Orders ED Discharge Orders     None        Sherwood Gambler, MD 12/23/20 1443

## 2020-12-26 ENCOUNTER — Telehealth: Payer: Self-pay

## 2020-12-26 NOTE — Telephone Encounter (Signed)
Pt called states that she went to the ER 12/23/20. Pt would like you to review her records from ER.Pt asked if she could be seen.

## 2020-12-29 NOTE — Telephone Encounter (Signed)
Sent to Kim to schedule for 12/30/20 at 1 pm

## 2020-12-30 ENCOUNTER — Telehealth (INDEPENDENT_AMBULATORY_CARE_PROVIDER_SITE_OTHER): Payer: Medicare HMO | Admitting: Family Medicine

## 2020-12-30 DIAGNOSIS — E039 Hypothyroidism, unspecified: Secondary | ICD-10-CM

## 2020-12-30 DIAGNOSIS — J329 Chronic sinusitis, unspecified: Secondary | ICD-10-CM

## 2020-12-30 DIAGNOSIS — E559 Vitamin D deficiency, unspecified: Secondary | ICD-10-CM

## 2020-12-30 DIAGNOSIS — R739 Hyperglycemia, unspecified: Secondary | ICD-10-CM | POA: Diagnosis not present

## 2020-12-30 DIAGNOSIS — I10 Essential (primary) hypertension: Secondary | ICD-10-CM | POA: Diagnosis not present

## 2020-12-30 DIAGNOSIS — R059 Cough, unspecified: Secondary | ICD-10-CM

## 2020-12-30 DIAGNOSIS — K449 Diaphragmatic hernia without obstruction or gangrene: Secondary | ICD-10-CM

## 2020-12-30 DIAGNOSIS — K219 Gastro-esophageal reflux disease without esophagitis: Secondary | ICD-10-CM

## 2020-12-30 DIAGNOSIS — R0600 Dyspnea, unspecified: Secondary | ICD-10-CM

## 2020-12-30 MED ORDER — CEFDINIR 300 MG PO CAPS: 300.0000 mg | ORAL_CAPSULE | Freq: Two times a day (BID) | ORAL | 0 refills | Status: AC

## 2020-12-30 MED ORDER — PROMETHAZINE-DM 6.25-15 MG/5ML PO SYRP: 2.5000 mL | ORAL_SOLUTION | Freq: Three times a day (TID) | ORAL | 0 refills | Status: DC | PRN

## 2020-12-30 MED ORDER — ALBUTEROL SULFATE HFA 108 (90 BASE) MCG/ACT IN AERS: 2.0000 | INHALATION_SPRAY | Freq: Four times a day (QID) | RESPIRATORY_TRACT | 2 refills | Status: DC | PRN

## 2020-12-30 MED ORDER — METHYLPREDNISOLONE 4 MG PO TABS: ORAL_TABLET | ORAL | 0 refills | Status: DC

## 2020-12-30 NOTE — Progress Notes (Signed)
MyChart Video Visit    Virtual Visit via Video Note   This visit type was conducted due to national recommendations for restrictions regarding the COVID-19 Pandemic (e.g. social distancing) in an effort to limit this patient's exposure and mitigate transmission in our community. This patient is at least at moderate risk for complications without adequate follow up. This format is felt to be most appropriate for this patient at this time. Physical exam was limited by quality of the video and audio technology used for the visit. S Chism, CMA was able to get the patient set up on a video visit.  Patient location: home Patient and provider in visit Provider location: Office  I discussed the limitations of evaluation and management by telemedicine and the availability of in person appointments. The patient expressed understanding and agreed to proceed.  Visit Date: 12/30/2020  Today's healthcare provider: Penni Homans, MD     Subjective:    Patient ID: Julie Wilkerson, female    DOB: 1940/08/04, 80 y.o.   MRN: [id number]  No chief complaint on file.   HPI  Patient is in today for follow up on infection she got while overseas in [place] and HTN.She states that she was feeling cold when she arrived to [place]. She started coughing and was on going for 5 days and was getting worse. Took hyoscyamine and allegra then her cough improved for several days, but came back with CP. She reports that she was struggling with CP most of the time she was in [place]. Denies HA/congestion/fevers or GU c/o. Taking meds as prescribed.  She reports that her sternum and face are painful to touch. She still experiences back pain. She states that she has chills, but denies fevers. She has left ear pain and experiences CP and SOB when exerting effort. She reports GI symptoms of left abdominal pain, diarrhea, and reduced appetite, but does not experience that much bloating.    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 lumpectomy, radtiation x 30   Breast cancer in female [facility])    Bursitis of left hip 09/04/2018   Change in bowel habits 02/22/2018   Change in mole 06/08/2016   Chest pain 03/04/2020   CHF (congestive heart failure) (HCC)    Chronic bilateral thoracic back pain 05/23/2016   Chronic diastolic congestive heart failure ([place]) 05/28/2019   Colitis    Complication of anesthesia    VERY SENSITIVE TO ANESTHESIA    Constipation 01/17/2018   Coronary artery disease involving native coronary artery of native heart without angina pectoris 05/28/2019   Cortical age-related cataract of right eye 04/18/2018   Cough variant asthma  vs UACS from ACEi     Spirometry 12/02/2016  FEV1 1.26 (76%)  Ratio 78 with min curvature p last saba > 6 h prior  - trial off acei 12/02/2016  - 12/02/2016  After extensive coaching HFA effectiveness =  75% with qvar autohaler > rechallenge with 80 2bid     Decreased visual acuity 01/16/2017   Degeneration of lumbar intervertebral disc 08/01/2018   Diarrhea 11/16/2015   D/o Diverticuli, polyps, celiac disease.     Dyspnea 11/04/2016   with asthma exacerbation only   Dysuria 11/04/2016   Elevated sed rate 10/23/2017   Essential hypertension    Trial off acei 12/02/2016 due to cough/ pseudoasthma   Gluten intolerance    History of chicken pox    History of Helicobacter pylori infection 08/01/2015   History of rectal bleeding 06/01/2019   Hx of  LASIK 04/24/2019   Hyperglycemia 11/07/2016   Hyperlipidemia    Hypertension    Hyponatremia 02/16/2019   Hypothyroidism    IBS (irritable bowel syndrome)    Lattice degeneration of right retina 04/24/2019   Left hand pain 05/07/2018   Left hip pain 03/01/2017   Left leg pain 03/01/2017   Left shoulder pain 05/07/2018   Low back pain 08/10/2015   LUQ pain 06/01/2019   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   Neck pain 03/01/2017   Nuclear sclerotic cataract of right eye 04/18/2018   Osteoporosis    Overweight (BMI 25.0-29.9) 02/14/2019   Pain in joint of left shoulder 08/11/2018   Paroxysmal atrial tachycardia ([place]) 05/28/2019   Pelvic pain 07/02/2018   Personal history of radiation therapy    Pneumonia    PONV (postoperative nausea and vomiting)    Poor appetite 02/22/2018   Pseudophakia of left eye 04/18/2018   PVD (posterior vitreous detachment), right 04/24/2019   Restless sleeper 11/07/2016   Right hip pain 10/23/2017   RLQ discomfort 03/01/2017   RLS (restless legs syndrome) 05/07/2019   S/P left THA, AA 02/13/2019   Sepsis due to pneumonia ([place]) 02/16/2019   Stomach cramps    Tremor 02/15/2016   Urinary frequency 12/22/2017   Urinary incontinence 05/07/2019   Urinary tract infection 11/04/2016   Vitamin D deficiency 04/29/2016    Past Surgical History:  Procedure Laterality Date   ABDOMINAL HYSTERECTOMY     partial   APPENDECTOMY     BREAST LUMPECTOMY Left 2002   CHOLECYSTECTOMY  Age 35 or 22   COLONOSCOPY  2017   EYE SURGERY Left    cataract   LEFT HEART CATH AND CORONARY ANGIOGRAPHY N/A 02/09/2019   Procedure: LEFT HEART CATH AND CORONARY ANGIOGRAPHY;  Surgeon: Nelva Bush, MD;  Location: [facility];  Service: Cardiovascular;  Laterality: N/A;   TONSILLECTOMY     TOTAL HIP ARTHROPLASTY Left 02/13/2019   Procedure: TOTAL HIP ARTHROPLASTY ANTERIOR APPROACH;  Surgeon: Paralee Cancel, MD;  Location: [facility];  Service:  Orthopedics;  Laterality: Left;  70 mins    Family History  Problem Relation Age of Onset   Colitis Mother    Irritable bowel syndrome Mother    Hypertension Father    Hyperlipidemia Brother    Heart attack Brother    Stomach cancer Paternal Grandmother 32   Colon cancer Paternal Grandmother    Hyperlipidemia Brother    Heart attack Brother    Hyperlipidemia Brother    Heart attack Brother    Hyperlipidemia Sister    Leukemia Daughter    Arthritis Daughter    Heart disease Daughter        ASD vs VSD   Esophageal cancer Neg Hx     Social History   Socioeconomic History   Marital status: Single  Spouse name: Not on file   Number of children: 3   Years of education: 71   Highest education level: Master's degree (e.g., MA, MS, MEng, MEd, MSW, MBA)  Occupational History   Occupation: retired    Comment: Pharmacist, hospital, kindergarten  Tobacco Use   Smoking status: Never   Smokeless tobacco: Never  Vaping Use   Vaping Use: Never used  Substance and Sexual Activity   Alcohol use: Never    Alcohol/week: 0.0 standard drinks   Drug use: Never   Sexual activity: Not Currently    Birth control/protection: None    Comment: lives alone, avoids daiiry and gluten. 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  Other Topics Concern   Not on file  Social History Narrative   Not on file   Social Determinants of Health   Financial Resource Strain: Low Risk    Difficulty of Paying Living Expenses: Not hard at all  Food Insecurity: No Food Insecurity   Worried About Charity fundraiser in the Last Year: Never true   [place] in the Last Year: Never true  Transportation Needs: No Transportation Needs   Lack of Transportation (Medical): No   Lack of Transportation (Non-Medical): No  Physical Activity: Insufficiently Active   Days of Exercise per Week: 1 day   Minutes of Exercise per Session: 30 min  Stress: No Stress Concern Present   Feeling of Stress : Not at all  Social Connections: Not on  file  Intimate Partner Violence: Not At Risk   Fear of Current or Ex-Partner: No   Emotionally Abused: No   Physically Abused: No   Sexually Abused: No    Outpatient Medications Prior to Visit  Medication Sig Dispense Refill   ASPIRIN 81 PO 81 mg daily.      atorvastatin (LIPITOR) 20 MG tablet Take 1 tablet by mouth once daily 90 tablet 0   fluticasone (FLONASE) 50 MCG/ACT nasal spray Place 2 sprays into both nostrils daily as needed for allergies or rhinitis. 16 g 5   furosemide (LASIX) 40 MG tablet Take 1 tablet (40 mg total) by mouth daily. 90 tablet 1   hyoscyamine (LEVSIN SL) 0.125 MG SL tablet Place 1 tablet (0.125 mg total) under the tongue 2 (two) times daily as needed. 30 tablet 1   Iron, Ferrous Sulfate, 325 (65 Fe) MG TABS Take 325 mg by mouth daily. 30 tablet 3   levothyroxine (SYNTHROID) 50 MCG tablet Take 1 tablet (50 mcg total) by mouth daily before breakfast. 90 tablet 1   Lidocaine 2 % GEL Apply 1 Dose topically 2 (two) times daily as needed. 28.33 g 1   lisinopril (ZESTRIL) 10 MG tablet Take 1 tablet (10 mg total) by mouth 2 (two) times daily as needed. 180 tablet 1   omega-3 acid ethyl esters (LOVAZA) 1 g capsule Take 2 g by mouth daily.     omeprazole (PRILOSEC) 40 MG capsule Take 1 capsule (40 mg total) by mouth in the morning and at bedtime. Take 1 tablet by mouth once to twice daily 60 capsule 5   ondansetron (ZOFRAN) 4 MG tablet Dissolve 1 tablet under tongue once to twice a day, every day 90 tablet 3   potassium chloride (KLOR-CON) 10 MEQ tablet Take 1 tablet by mouth once daily 90 tablet 1   sucralfate (CARAFATE) 1 g tablet Take 1 tablet (1 g total) by mouth 2 (two) times daily. 60 tablet 0   albuterol (VENTOLIN HFA) 108 (90 Base) MCG/ACT inhaler  Inhale 2 puffs into the lungs every 6 (six) hours as needed for wheezing or shortness of breath. 18 g 2   citalopram (CELEXA) 20 MG tablet Take 1 tablet (20 mg total) by mouth at bedtime. 90 tablet 1   metoprolol succinate  (TOPROL-XL) 25 MG 24 hr tablet TAKE 1 TABLET BY MOUTH AT BEDTIME 90 tablet 0   No facility-administered medications prior to visit.    No Known Allergies  Review of Systems  Constitutional:  Positive for chills. Negative for fever and malaise/fatigue.  HENT:  Positive for ear pain (left). Negative for congestion, sinus pain and sore throat.   Eyes:  Negative for blurred vision and pain.  Respiratory:  Positive for cough and shortness of breath.   Cardiovascular:  Positive for chest pain. Negative for palpitations and leg swelling.  Gastrointestinal:  Positive for abdominal pain (left) and diarrhea. Negative for blood in stool, nausea and vomiting.  Genitourinary:  Negative for flank pain and frequency.  Musculoskeletal:  Positive for back pain.  Skin:  Negative for rash.      Objective:    Physical Exam Constitutional:      Appearance: Normal appearance.  HENT:     Head: Normocephalic and atraumatic.     Right Ear: External ear normal.     Left Ear: External ear normal.  Pulmonary:     Effort: Pulmonary effort is normal.  Musculoskeletal:     Cervical back: No rigidity.  Neurological:     Mental Status: She is alert and oriented to person, place, and time.  Psychiatric:        Behavior: Behavior normal.    There were no vitals taken for this visit. Wt Readings from Last 3 Encounters:  12/23/20 120 lb 6.4 oz (54.6 kg)  12/23/20 120 lb 6.4 oz (54.6 kg)  12/19/20 120 lb (54.4 kg)    Diabetic Foot Exam - Simple   No data filed    Lab Results  Component Value Date   WBC 5.3 12/23/2020   HGB 11.3 (L) 12/23/2020   HCT 34.0 (L) 12/23/2020   PLT 239 12/23/2020   GLUCOSE 96 12/23/2020   CHOL 160 05/29/2020   TRIG 105.0 05/29/2020   HDL 65.90 05/29/2020   LDLCALC 73 05/29/2020   ALT 17 12/23/2020   AST 24 12/23/2020   NA 137 12/23/2020   K 4.0 12/23/2020   CL 101 12/23/2020   CREATININE 1.08 (H) 12/23/2020   BUN 16 12/23/2020   CO2 26 12/23/2020   TSH 3.05  05/29/2020   INR 1.2 02/16/2019   HGBA1C 5.8 05/29/2020    Lab Results  Component Value Date   TSH 3.05 05/29/2020   Lab Results  Component Value Date   WBC 5.3 12/23/2020   HGB 11.3 (L) 12/23/2020   HCT 34.0 (L) 12/23/2020   MCV 89.7 12/23/2020   PLT 239 12/23/2020   Lab Results  Component Value Date   NA 137 12/23/2020   K 4.0 12/23/2020   CO2 26 12/23/2020   GLUCOSE 96 12/23/2020   BUN 16 12/23/2020   CREATININE 1.08 (H) 12/23/2020   BILITOT 0.4 12/23/2020   ALKPHOS 60 12/23/2020   AST 24 12/23/2020   ALT 17 12/23/2020   PROT 7.2 12/23/2020   ALBUMIN 4.0 12/23/2020   CALCIUM 8.9 12/23/2020   ANIONGAP 10 12/23/2020   GFR 42.00 (L) 09/15/2020   Lab Results  Component Value Date   CHOL 160 05/29/2020   Lab Results  Component Value Date  HDL 65.90 05/29/2020   Lab Results  Component Value Date   LDLCALC 73 05/29/2020   Lab Results  Component Value Date   TRIG 105.0 05/29/2020   Lab Results  Component Value Date   CHOLHDL 2 05/29/2020   Lab Results  Component Value Date   HGBA1C 5.8 05/29/2020       Assessment & Plan:   Problem List Items Addressed This Visit     Essential hypertension (Chronic)    Monitor and report any concerns, no changes to meds. Encouraged heart healthy diet such as the DASH diet and exercise as tolerated.        Hypothyroid    On Levothyroxine, continue to monitor       Vitamin D deficiency   Hyperglycemia    hgba1c acceptable, minimize simple carbs. Increase exercise as tolerated.        Dyspnea    She has just returned from a trip to [place] visiting family while she was there she felt ill once again.  She struggled with cough and cold and flu type symptoms with chills and rhinorrhea.  Body aches malaise and fatigue she developed ear pain increased shortness of breath and recurrence of some diarrhea and GI symptoms.  Upon return she presented to our emergency room due to her shortness of breath and concerns  about PE.  Imaging ruled out PE BNP was noted to be 190.5 so the possibility of mild and early CHF was considered but most recent echocardiogram did not show any signs of that.  She has an appointment on September 14 with gastroenterology for her ongoing GI issues, hiatal hernia and concerns.  She continues to feel poorly although she is improving from her worst state and [place].  Her CAT scan upon return here did show groundglass appearance in both lungs.  She continues to have a dry cough with clear rhinorrhea malaise and left ear pain she notes that a course of steroids did help her symptoms temporarily.  We will proceed with repeat echocardiogram, refer her to pulmonary for further consideration of interstitial lung disease or an atypical infection.  She is having symptoms of sinusitis including congestion and sinus pressure so we will also treat with an antibiotic and probiotic.  She will present if symptoms worsen for further evaluation. Spent 25 minutes discussing patient current state and plan of care.        Relevant Orders   Ambulatory referral to Pulmonology   ECHOCARDIOGRAM COMPLETE   Sinusitis    Started on antibiotics and Mucinex. Encouraged increased rest and hydration, add probiotics, zinc such as Coldeze or Xicam. Treat fevers as needed       Relevant Medications   promethazine-dextromethorphan (PROMETHAZINE-DM) 6.25-15 MG/5ML syrup   methylPREDNISolone (MEDROL) 4 MG tablet   cefdinir (OMNICEF) 300 MG capsule   Gastroesophageal reflux disease with hiatal hernia    Avoid offending foods, start probiotics. Do not eat large meals in late evening and consider raising head of bed. Has an appointment with gastroenterology on 9/14       Other Visit Diagnoses     Cough    -  Primary   Relevant Orders   Ambulatory referral to Pulmonology   ECHOCARDIOGRAM COMPLETE        Meds ordered this encounter  Medications   promethazine-dextromethorphan (PROMETHAZINE-DM) 6.25-15  MG/5ML syrup    Sig: Take 2.5-5 mLs by mouth 3 (three) times daily as needed for cough.    Dispense:  180 mL  Refill:  0   albuterol (VENTOLIN HFA) 108 (90 Base) MCG/ACT inhaler    Sig: Inhale 2 puffs into the lungs every 6 (six) hours as needed for wheezing or shortness of breath.    Dispense:  18 g    Refill:  2   methylPREDNISolone (MEDROL) 4 MG tablet    Sig: 5 tabs po x 3 day then 4 tabs po x 3 day then 3 tabs po x 3 day then 2 tabs po x 3 day then 1 tab po x 3 day and stop    Dispense:  45 tablet    Refill:  0   cefdinir (OMNICEF) 300 MG capsule    Sig: Take 1 capsule (300 mg total) by mouth 2 (two) times daily for 10 days.    Dispense:  20 capsule    Refill:  0     I discussed the assessment and treatment plan with the patient. The patient was provided an opportunity to ask questions and all were answered. The patient agreed with the plan and demonstrated an understanding of the instructions.   The patient was advised to call back or seek an in-person evaluation if the symptoms worsen or if the condition fails to improve as anticipated.  I provided 21 minutes of face-to-face time during this encounter.   Penni Homans, MD [facility] [phone] (phone) [phone] (fax)   I, Suezanne Jacquet, acting as a scribe for Penni Homans, MD, have documented all relevent documentation on behalf of Penni Homans, MD, as directed by Penni Homans, MD while in the presence of Penni Homans, MD.  I, [name], MD personally performed the services described in this documentation. All medical record entries made by the scribe were at my direction and in my presence. I have reviewed the chart and agree that the record reflects my personal performance and is accurate and complete

## 2020-12-31 ENCOUNTER — Other Ambulatory Visit: Payer: Self-pay | Admitting: Family Medicine

## 2020-12-31 DIAGNOSIS — K219 Gastro-esophageal reflux disease without esophagitis: Secondary | ICD-10-CM | POA: Insufficient documentation

## 2020-12-31 DIAGNOSIS — K449 Diaphragmatic hernia without obstruction or gangrene: Secondary | ICD-10-CM | POA: Insufficient documentation

## 2020-12-31 DIAGNOSIS — J329 Chronic sinusitis, unspecified: Secondary | ICD-10-CM | POA: Insufficient documentation

## 2020-12-31 NOTE — Assessment & Plan Note (Signed)
Avoid offending foods, start probiotics. Do not eat large meals in late evening and consider raising head of bed. Has an appointment with gastroenterology on 9/14

## 2020-12-31 NOTE — Assessment & Plan Note (Addendum)
She has just returned from a trip to [place] visiting family while she was there she felt ill once again.  She struggled with cough and cold and flu type symptoms with chills and rhinorrhea.  Body aches malaise and fatigue she developed ear pain increased shortness of breath and recurrence of some diarrhea and GI symptoms.  Upon return she presented to our emergency room due to her shortness of breath and concerns about PE.  Imaging ruled out PE BNP was noted to be 190.5 so the possibility of mild and early CHF was considered but most recent echocardiogram did not show any signs of that.  She has an appointment on September 14 with gastroenterology for her ongoing GI issues, hiatal hernia and concerns.  She continues to feel poorly although she is improving from her worst state and [place].  Her CAT scan upon return here did show groundglass appearance in both lungs.  She continues to have a dry cough with clear rhinorrhea malaise and left ear pain she notes that a course of steroids did help her symptoms temporarily.  We will proceed with repeat echocardiogram, refer her to pulmonary for further consideration of interstitial lung disease or an atypical infection.  She is having symptoms of sinusitis including congestion and sinus pressure so we will also treat with an antibiotic and probiotic.  She will present if symptoms worsen for further evaluation. Spent 25 minutes discussing patient current state and plan of care.

## 2020-12-31 NOTE — Assessment & Plan Note (Signed)
hgba1c acceptable, minimize simple carbs. Increase exercise as tolerated.  

## 2020-12-31 NOTE — Assessment & Plan Note (Signed)
On Levothyroxine, continue to monitor 

## 2020-12-31 NOTE — Assessment & Plan Note (Signed)
Started on antibiotics and Mucinex. Encouraged increased rest and hydration, add probiotics, zinc such as Coldeze or Xicam. Treat fevers as needed

## 2020-12-31 NOTE — Assessment & Plan Note (Signed)
Monitor and report any concerns, no changes to meds. Encouraged heart healthy diet such as the DASH diet and exercise as tolerated.  ?

## 2021-01-01 DIAGNOSIS — H04123 Dry eye syndrome of bilateral lacrimal glands: Secondary | ICD-10-CM | POA: Diagnosis not present

## 2021-01-08 ENCOUNTER — Telehealth: Payer: Self-pay

## 2021-01-08 ENCOUNTER — Other Ambulatory Visit: Payer: Self-pay | Admitting: Family Medicine

## 2021-01-08 NOTE — Telephone Encounter (Signed)
Confirmed provider

## 2021-01-12 ENCOUNTER — Other Ambulatory Visit: Payer: Self-pay | Admitting: Family Medicine

## 2021-01-13 ENCOUNTER — Ambulatory Visit (HOSPITAL_BASED_OUTPATIENT_CLINIC_OR_DEPARTMENT_OTHER)
Admission: RE | Admit: 2021-01-13 | Discharge: 2021-01-13 | Disposition: A | Discharge status: Home / Self Care | Payer: Medicare HMO | Source: Ambulatory Visit | Attending: Family Medicine | Admitting: Family Medicine

## 2021-01-13 DIAGNOSIS — R0600 Dyspnea, unspecified: Secondary | ICD-10-CM

## 2021-01-13 DIAGNOSIS — R059 Cough, unspecified: Secondary | ICD-10-CM | POA: Diagnosis not present

## 2021-01-13 DIAGNOSIS — R0609 Other forms of dyspnea: Secondary | ICD-10-CM | POA: Diagnosis not present

## 2021-01-13 LAB — ECHOCARDIOGRAM COMPLETE
AR max vel: 1.22 cm2
AV Area VTI: 1.27 cm2
AV Area mean vel: 1.25 cm2
AV Mean grad: 4.5 mmHg
AV Peak grad: 9.1 mmHg
Ao pk vel: 1.51 m/s
Area-P 1/2: 4.74 cm2
P 1/2 time: 1027 msec
S' Lateral: 2.36 cm
Single Plane A4C EF: 76.3 %

## 2021-01-13 NOTE — Progress Notes (Signed)
*  PRELIMINARY RESULTS* Echocardiogram 2D Echocardiogram has been performed.  Luisa Hart RDCS 01/13/2021, 2:30 PM

## 2021-01-15 DIAGNOSIS — M5416 Radiculopathy, lumbar region: Secondary | ICD-10-CM | POA: Diagnosis not present

## 2021-01-15 DIAGNOSIS — M48062 Spinal stenosis, lumbar region with neurogenic claudication: Secondary | ICD-10-CM | POA: Diagnosis not present

## 2021-01-15 DIAGNOSIS — M542 Cervicalgia: Secondary | ICD-10-CM | POA: Diagnosis not present

## 2021-01-15 DIAGNOSIS — M5459 Other low back pain: Secondary | ICD-10-CM | POA: Diagnosis not present

## 2021-01-19 DIAGNOSIS — G4733 Obstructive sleep apnea (adult) (pediatric): Secondary | ICD-10-CM | POA: Diagnosis not present

## 2021-01-27 ENCOUNTER — Telehealth: Payer: Self-pay | Admitting: Gastroenterology

## 2021-01-27 ENCOUNTER — Other Ambulatory Visit (INDEPENDENT_AMBULATORY_CARE_PROVIDER_SITE_OTHER): Payer: Medicare HMO

## 2021-01-27 DIAGNOSIS — R194 Change in bowel habit: Secondary | ICD-10-CM | POA: Diagnosis not present

## 2021-01-27 DIAGNOSIS — R1013 Epigastric pain: Secondary | ICD-10-CM

## 2021-01-27 DIAGNOSIS — R109 Unspecified abdominal pain: Secondary | ICD-10-CM

## 2021-01-27 DIAGNOSIS — R1032 Left lower quadrant pain: Secondary | ICD-10-CM

## 2021-01-27 LAB — CBC WITH DIFFERENTIAL/PLATELET
Basophils Absolute: 0 10*3/uL (ref 0.0–0.1)
Basophils Relative: 0.2 % (ref 0.0–3.0)
Eosinophils Absolute: 0 10*3/uL (ref 0.0–0.7)
Eosinophils Relative: 0 % (ref 0.0–5.0)
HCT: 33.5 % — ABNORMAL LOW (ref 36.0–46.0)
Hemoglobin: 11.2 g/dL — ABNORMAL LOW (ref 12.0–15.0)
Lymphocytes Relative: 40.5 % (ref 12.0–46.0)
Lymphs Abs: 1.7 10*3/uL (ref 0.7–4.0)
MCHC: 33.2 g/dL (ref 30.0–36.0)
MCV: 88.5 fl (ref 78.0–100.0)
Monocytes Absolute: 0.6 10*3/uL (ref 0.1–1.0)
Monocytes Relative: 13.7 % — ABNORMAL HIGH (ref 3.0–12.0)
Neutro Abs: 1.9 10*3/uL (ref 1.4–7.7)
Neutrophils Relative %: 45.6 % (ref 43.0–77.0)
Platelets: 229 10*3/uL (ref 150.0–400.0)
RBC: 3.79 Mil/uL — ABNORMAL LOW (ref 3.87–5.11)
RDW: 14.9 % (ref 11.5–15.5)
WBC: 4.1 10*3/uL (ref 4.0–10.5)

## 2021-01-27 LAB — COMPREHENSIVE METABOLIC PANEL
ALT: 13 U/L (ref 0–35)
AST: 20 U/L (ref 0–37)
Albumin: 3.9 g/dL (ref 3.5–5.2)
Alkaline Phosphatase: 64 U/L (ref 39–117)
BUN: 34 mg/dL — ABNORMAL HIGH (ref 6–23)
CO2: 24 mEq/L (ref 19–32)
Calcium: 8.6 mg/dL (ref 8.4–10.5)
Chloride: 104 mEq/L (ref 96–112)
Creatinine, Ser: 1.44 mg/dL — ABNORMAL HIGH (ref 0.40–1.20)
GFR: 34.67 mL/min — ABNORMAL LOW (ref >60.00)
Glucose, Bld: 131 mg/dL — ABNORMAL HIGH (ref 70–99)
Potassium: 4.3 mEq/L (ref 3.5–5.1)
Sodium: 137 mEq/L (ref 135–145)
Total Bilirubin: 0.5 mg/dL (ref 0.2–1.2)
Total Protein: 7 g/dL (ref 6.0–8.3)

## 2021-01-27 LAB — LIPASE: Lipase: 30 U/L (ref 11.0–59.0)

## 2021-01-27 NOTE — Telephone Encounter (Signed)
Spoke with patient, she states that she has had ongoing generalized abdominal pain for the last 2 months. Pt states that she has been taking Tylenol and Ibuprofen to help with the pain. She reports that the pain is worse on her left side, she reports that she has upper abdominal swelling as well. Pt states that her last BM was yesterday, she rep;orts that she had 2 loose stools yesterday, describes as a medium amount. Denies any fever or bleeding. She reports that she has pain almost all of the time, she reports that the pain radiates to her back and down her left leg. Pt states that sometimes the pain is so bad that she can't stand up and will have to lay down. Pt states that she is not eating as much because she feels like something is getting stuck in her stomach. Pt reports that she has lost about 8 lbs in the last month. Pt's appt has been moved to Friday, 01/30/21 at 8:10 am. Please advise if there is anything that you would like patient to do prior to her appt. Thanks

## 2021-01-27 NOTE — Telephone Encounter (Signed)
Pt is requesting a sooner appt that 01/1421. She stated to feel very sick. She reported to have a generalized stomach pain, but is worse on her left side, she states that pain radiates to her left leg. If she eats she feels like food gets stuck in the upper part of her stomach so she has been eating very little. Pls call her.

## 2021-01-27 NOTE — Telephone Encounter (Signed)
Hard to say what is going on without seeing her. She she has had a CT scan of her chest and abdomen within recent months. She should come in for CBC, CMET, lipase to make sure okay. She should stop NSAIDs / ibuprofen I see she is on omeprazole BID and she should continue that. If she is in distress, can't eat, severe pain, fevers, etc, in the interim her option would be to go to the ED. Otherwise if stable for the past 2 months I will see her on Friday

## 2021-01-27 NOTE — Telephone Encounter (Addendum)
Spoke with patient in regards to recommendations. Pt is aware that no appt is necessary. Pt is aware of symptoms that will require ED evaluation if she can't make it until her appt. Patient verbalized understanding and had no concerns at the end of the call.  Lab order in epic.

## 2021-01-30 ENCOUNTER — Encounter: Payer: Self-pay | Admitting: Gastroenterology

## 2021-01-30 ENCOUNTER — Ambulatory Visit (INDEPENDENT_AMBULATORY_CARE_PROVIDER_SITE_OTHER): Payer: Medicare HMO | Admitting: Gastroenterology

## 2021-01-30 ENCOUNTER — Other Ambulatory Visit (INDEPENDENT_AMBULATORY_CARE_PROVIDER_SITE_OTHER): Payer: Medicare HMO

## 2021-01-30 ENCOUNTER — Other Ambulatory Visit: Payer: Self-pay

## 2021-01-30 VITALS — BP 112/58 | HR 61 | Ht <= 58 in | Wt 119.0 lb

## 2021-01-30 DIAGNOSIS — G8929 Other chronic pain: Secondary | ICD-10-CM

## 2021-01-30 DIAGNOSIS — K449 Diaphragmatic hernia without obstruction or gangrene: Secondary | ICD-10-CM

## 2021-01-30 DIAGNOSIS — K6289 Other specified diseases of anus and rectum: Secondary | ICD-10-CM

## 2021-01-30 DIAGNOSIS — K219 Gastro-esophageal reflux disease without esophagitis: Secondary | ICD-10-CM

## 2021-01-30 DIAGNOSIS — R1013 Epigastric pain: Secondary | ICD-10-CM | POA: Diagnosis not present

## 2021-01-30 DIAGNOSIS — M549 Dorsalgia, unspecified: Secondary | ICD-10-CM | POA: Diagnosis not present

## 2021-01-30 DIAGNOSIS — R109 Unspecified abdominal pain: Secondary | ICD-10-CM | POA: Diagnosis not present

## 2021-01-30 LAB — BASIC METABOLIC PANEL
BUN: 41 mg/dL — ABNORMAL HIGH (ref 6–23)
CO2: 24 mEq/L (ref 19–32)
Calcium: 9.3 mg/dL (ref 8.4–10.5)
Chloride: 105 mEq/L (ref 96–112)
Creatinine, Ser: 1.26 mg/dL — ABNORMAL HIGH (ref 0.40–1.20)
GFR: 40.7 mL/min — ABNORMAL LOW (ref >60.00)
Glucose, Bld: 93 mg/dL (ref 70–99)
Potassium: 4.2 mEq/L (ref 3.5–5.1)
Sodium: 138 mEq/L (ref 135–145)

## 2021-01-30 MED ORDER — HYOSCYAMINE SULFATE 0.125 MG SL SUBL: 0.1250 mg | SUBLINGUAL_TABLET | Freq: Two times a day (BID) | SUBLINGUAL | 1 refills | Status: DC

## 2021-01-30 MED ORDER — AMBULATORY NON FORMULARY MEDICATION: Status: DC

## 2021-01-30 MED ORDER — IBGARD 90 MG PO CPCR: ORAL_CAPSULE | ORAL | 0 refills | Status: DC

## 2021-01-30 NOTE — Patient Instructions (Addendum)
If you are age 79 or older, your body mass index should be between 23-30. Your Body mass index is 24.87 kg/m. If this is out of the aforementioned range listed, please consider follow up with your Primary Care Provider.  If you are age 90 or younger, your body mass index should be between 19-25. Your Body mass index is 24.87 kg/m. If this is out of the aformentioned range listed, please consider follow up with your Primary Care Provider.   __________________________________________________________  The [facility] providers would like to encourage you to use [facility] to communicate with providers for non-urgent requests or questions.  Due to long hold times on the telephone, sending your provider a message by [facility] may be a faster and more efficient way to get a response.  Please allow 48 business hours for a response.  Please remember that this is for non-urgent requests.   You have been scheduled for an endoscopy. Please follow written instructions given to you at your visit today. If you use inhalers (even only as needed), please bring them with you on the day of your procedure.  Please go to the lab in the basement of our building to have lab work done as you leave today. Hit "B" for basement when you get on the elevator.  When the doors open the lab is on your left.  We will call you with the results. Thank you.  Take Levsin twice a day.  We have given you samples of the following medication to take: IBgard: Take as directed as needed  ____________________________________________________________________  We have sent a prescription for nitroglycerin 0.125% gel to [facility]. You should apply a pea size amount to your rectum three times daily x 6-8 weeks.  [facility]'s information is below: Address: [address] [place], [place] [address]  [phone]  *Please DO NOT go directly from our office to pick up this medication! Give the pharmacy 1 day to  process the prescription as this is compounded and takes time to make.  _____________________________________________________________________    Thank you for entrusting me with your care and for choosing [facility], Dr. Woodland Cellar

## 2021-01-30 NOTE — Progress Notes (Signed)
HPI :  80 year old female here for follow-up visit with me for multiple abdominal symptoms.  She has a history of GERD, dyspepsia, hiatal hernia.  Patient today describes a variety of symptoms and pains that have been bothering her.  Her main complaint is discomfort in her upper abdomen that can radiate into her left upper side.  She states this is present pretty much all the time and never really goes away.  She has been eating less because she thinks eating more will make her feel worse.  She states food will get "stuck" in her upper abdomen after she eats it makes her feel worse.  She has early satiety with this and has been eating small portions.  She states it takes "forever" for food to go down.  She denies any heartburn.  She will have occasional vomiting postprandially with the symptoms.  She feels distended and bloated in her upper abdomen but denies much gas that bothers her.  She thinks she perhaps has lost a few pounds since this started.  Recall she has had a 5 cm hiatal hernia on EGD done over a year ago.  She has been taking omeprazole twice daily as well as Carafate daily, she does think this helps her symptoms, but clearly does not resolve it.  She also endorses a variety of other pains.  She has pain in her mid back which can radiate down to her left leg and into her abdomen as well.  She has an MRI of her back pending this weekend to further evaluate this.  She has intermittent loose stools and has been using Levsin as needed which works well to control this as well as lower abdominal pain when she has it.  She has not been using her Levsin routinely.  She inquires about rectal pain that has been bothering her for the past several weeks.  She has pain with bowel movements and when she sits.  No blood in her stools.  She has about 3 bowel movements per day at baseline.  She had what she states is problems with asthma in recent months but is currently breathing fine at this time  without problems.  She had an echocardiogram done in August 23 which showed an EF of 60 to 65% without any diastolic dysfunction.  Overall she states her symptoms in general have been bothering her worse since beginning of May.  Recall on May 2 of this year she had a CT scan of the abdomen pelvis with contrast which showed some diverticulosis and a hiatal hernia but no other concerning pathology.  She had a CT scan of her chest with contrast last months which showed no evidence of PE or other acute intrathoracic vascular findings.  She had some patchy groundglass opacities bilaterally.  She is seeing pulmonary for that finding.  Of note she had some lab work drawn this past week.  She had a hemoglobin 11.2 with an MCV of 88.  BUN elevated at 34, creatinine elevated at 1.44, I asked her to stop her Lasix which she has done.  LFTs normal.  Lipase normal     Prior work-up: CT scan 10/24/18 - spinal stenosis lumbar spine   CT scan 12/23/2017 - diverticulosis, no cause for pain  CT scan 07/19/2017 - no cause for pain noted   Colonoscopy 08/05/2015 - AVM in the cecum noted, ablated with APC. Ileum normal. No polyps or evidence of colitis. Biopsies not taken.  EGD 08/05/2015 - 5cm HH, mild  gastritis on path, mild esophagitis with esophagus with normal path, normal duodenum on path   CT scan 06/06/2019: IMPRESSION: 1. No acute findings in the abdomen or pelvis. 2. Moderate hiatal hernia with 25-50% of the stomach contained in the chest. 3. Left colonic diverticulosis without diverticulitis. 4.  Aortic Atherosclerois (ICD10-170.0)   GES 12/07/18 - normal   Colonoscopy 08/14/19 - - The examined portion of the ileum was normal. - One diminutive polyp in the cecum, removed with a cold biopsy forceps. Resected and retrieved. - Two diminutive polyps in the transverse colon, removed with a cold biopsy forceps. Resected and retrieved. - One 5 mm polyp at the splenic flexure, removed with a cold snare.  Resected and retrieved. - One 6 mm polyp in the descending colon, removed with a cold snare. Resected and retrieved. - Diverticulosis in the sigmoid colon. - Internal hemorrhoids. - The examination was otherwise normal. - Biopsies were taken with a cold forceps from the right colon, left colon and transverse colon for evaluation of microscopic colitis.   Path negative for MC     EGD 11/28/19 -  - A 5 cm hiatal hernia was present. No obvious cameron lesions noted. - The exam of the esophagus was otherwise normal. - Patchy mildly erythematous mucosa was found in the gastric antrum. - The exam of the stomach was otherwise normal. No ulcers - Biopsies were taken with a cold forceps in the gastric body, at the incisura and in the gastric antrum for Helicobacter pylori testing. - The duodenal bulb and second portion of the duodenum were normal. Biopsies for histology were taken with a cold forceps for evaluation of celiac disease.   1. Surgical [P], duodenal bulb, 2nd portion of duodenum and distal duodenum - BENIGN DUODENAL MUCOSA. - NO FEATURES OF CELIAC SPRUE OR GRANULOMAS. 2. Surgical [P], gastric antrum and gastric body - ANTRAL AND OXYNTIC MUCOSA WITH HYPEREMIA. - WARTHIN-STARRY NEGATIVE FOR HELICOBACTER PYLORI. - NO INTESTINAL METAPLASIA, DYSPLASIA OR CARCINOMA.   CT abdomen pelvis with contrast 09/23/2020 IMPRESSION: Colonic diverticulosis. No radiographic evidence of diverticulitis or other acute findings. Small hiatal hernia.     CT angio chest / abdomen / pelvis 03/04/20 - IMPRESSION: 1. Normal contour and caliber of the thoracic and abdominal aorta. No evidence of aneurysm, dissection, or other acute aortic pathology. Scattered atherosclerosis. 2. No acute findings in the chest, abdomen or pelvis to explain pain. 3. Small hiatal hernia. 4. Sigmoid diverticulosis without evidence of diverticulitis. 5. Status post cholecystectomy and hysterectomy   CT abdomen / pelvis  with contrast 09/23/20 -  IMPRESSION: Colonic diverticulosis. No radiographic evidence of diverticulitis or other acute findings. Small hiatal hernia.  CT chest angio 12/23/20: IMPRESSION: Negative for significant acute pulmonary embolus by CTA.  No other acute intrathoracic vascular finding. Diffuse bilateral patchy ground-glass opacities, nonspecific. See above comment. 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 lumpectomy, radtiation x 30   Breast cancer in female [facility])    Bursitis of left hip 09/04/2018   Change in bowel habits 02/22/2018   Change in mole 06/08/2016   Chest pain 03/04/2020   CHF (congestive heart failure) (HCC)    Chronic bilateral thoracic back pain 05/23/2016   Chronic diastolic congestive heart failure ([place]) 05/28/2019   Colitis    Complication of anesthesia    VERY SENSITIVE TO ANESTHESIA    Constipation 01/17/2018   Coronary artery disease involving native coronary artery of native heart without angina pectoris 05/28/2019   Cortical age-related cataract of right eye 04/18/2018   Cough variant asthma  vs UACS from ACEi     Spirometry 12/02/2016  FEV1 1.26 (76%)  Ratio 78 with min curvature p last saba > 6 h prior  - trial off acei 12/02/2016  - 12/02/2016  After extensive coaching HFA effectiveness =    75% with qvar autohaler >  rechallenge with 80 2bid     Decreased visual acuity 01/16/2017   Degeneration of lumbar intervertebral disc 08/01/2018   Diarrhea 11/16/2015   D/o Diverticuli, polyps, celiac disease.     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   Left hand pain 05/07/2018   Left hip pain 03/01/2017   Left leg pain 03/01/2017   Left shoulder pain 05/07/2018   Low back pain 08/10/2015   LUQ pain 06/01/2019   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   Neck pain 03/01/2017   Nuclear sclerotic cataract of right eye 04/18/2018   Osteoporosis    Overweight (BMI 25.0-29.9) 02/14/2019   Pain in joint of left shoulder 08/11/2018   Paroxysmal atrial tachycardia ([place]) 05/28/2019   Pelvic pain 07/02/2018   Personal history of radiation therapy    Pneumonia    PONV (postoperative nausea and vomiting)    Poor appetite 02/22/2018   Pseudophakia of left eye 04/18/2018   PVD (posterior vitreous detachment), right 04/24/2019   Restless sleeper 11/07/2016   Right hip pain 10/23/2017   RLQ discomfort 03/01/2017   RLS (restless legs syndrome) 05/07/2019   S/P left THA, AA 02/13/2019   Sepsis due to pneumonia ([place]) 02/16/2019   Stomach cramps    Tremor 02/15/2016   Urinary frequency 12/22/2017   Urinary incontinence 05/07/2019   Urinary tract infection 11/04/2016   Vitamin D deficiency 04/29/2016     Past Surgical History:  Procedure Laterality Date   ABDOMINAL HYSTERECTOMY     partial    APPENDECTOMY     BREAST LUMPECTOMY Left 2002   CHOLECYSTECTOMY  Age 72 or 41   COLONOSCOPY  2017   EYE SURGERY Left    cataract   LEFT HEART CATH AND CORONARY ANGIOGRAPHY N/A 02/09/2019   Procedure: LEFT HEART CATH AND CORONARY ANGIOGRAPHY;  Surgeon: Nelva Bush, MD;  Location: [facility];  Service: Cardiovascular;  Laterality: N/A;   TONSILLECTOMY     TOTAL HIP ARTHROPLASTY Left 02/13/2019   Procedure: TOTAL HIP ARTHROPLASTY ANTERIOR APPROACH;  Surgeon: Paralee Cancel, MD;  Location: [facility];  Service: Orthopedics;  Laterality: Left;  70 mins   Family History  Problem Relation Age of Onset   Colitis Mother    Irritable bowel syndrome Mother    Hypertension Father    Hyperlipidemia Sister    Hyperlipidemia Brother    Heart attack Brother    Hyperlipidemia Brother    Heart attack Brother    Hyperlipidemia Brother    Heart attack Brother    Stomach cancer Paternal Grandmother 59   Colon cancer Paternal Grandmother    Leukemia Daughter    Arthritis Daughter    Heart disease Daughter        ASD vs VSD   Esophageal cancer Neg Hx    Pancreatic cancer Neg Hx    Social History   Tobacco Use   Smoking status: Never   Smokeless tobacco: Never  Vaping Use   Vaping Use: Never used  Substance Use Topics   Alcohol use: Never    Alcohol/week: 0.0 standard drinks   Drug use: Never   Current Outpatient Medications  Medication Sig Dispense Refill   albuterol (VENTOLIN HFA) 108 (90 Base) MCG/ACT inhaler Inhale 2 puffs into the lungs every 6 (six) hours as needed for wheezing or shortness of breath. 18 g 2   ASPIRIN 81 PO 81 mg daily.      atorvastatin (LIPITOR) 20 MG tablet Take 1 tablet by mouth once daily 90 tablet 0   citalopram (CELEXA) 20 MG tablet TAKE 1 TABLET BY MOUTH AT BEDTIME 90 tablet 0   fluticasone (FLONASE) 50 MCG/ACT nasal spray Place 2 sprays into both nostrils daily as needed for allergies or rhinitis. 16 g 5   furosemide (LASIX) 40 MG tablet Take 1 tablet  by mouth once daily 90 tablet 0   hyoscyamine (LEVSIN SL) 0.125 MG SL tablet Place 1 tablet (0.125 mg total) under the tongue 2 (two) times daily as needed. 30 tablet 1   Iron, Ferrous Sulfate, 325 (65 Fe) MG TABS Take 325 mg by mouth daily. 30 tablet 3   levothyroxine (SYNTHROID) 50 MCG tablet Take 1 tablet (50 mcg total) by mouth daily before breakfast. 90 tablet 1   lisinopril (ZESTRIL) 10 MG tablet Take 1 tablet (10 mg total) by mouth 2 (two) times daily as needed. 180 tablet 1   metoprolol succinate (TOPROL-XL) 25 MG 24 hr tablet TAKE 1 TABLET BY MOUTH AT BEDTIME 90 tablet 0   omega-3 acid ethyl esters (LOVAZA) 1 g capsule Take 2 g by mouth daily.     omeprazole (PRILOSEC) 40 MG capsule Take 1 capsule (40 mg total) by mouth in the morning and at bedtime. Take 1 tablet by mouth once to twice daily 60 capsule 5   ondansetron (ZOFRAN) 4 MG tablet Dissolve 1 tablet under tongue once to twice a day, every day 90 tablet 3   potassium chloride (KLOR-CON) 10 MEQ tablet Take 1 tablet by mouth once daily 90 tablet 0   sucralfate (CARAFATE) 1 g tablet Take 1 tablet (1 g total) by mouth 2 (two) times daily. 60 tablet 0   No current facility-administered medications for this visit.   No Known Allergies   Review of Systems: All systems reviewed and negative except where noted in HPI.    ECHOCARDIOGRAM  COMPLETE  Result Date: 01/13/2021    ECHOCARDIOGRAM REPORT   Patient Name:   AYRIEL KOVALSKY Date of Exam: 01/13/2021 Medical Rec #:  [id number]          Height:       58.0 in Accession #:    [id number]         Weight:       120.4 lb Date of Birth:  1941-03-29           BSA:          1.468 m Patient Age:    33 years           BP:           128/77 mmHg Patient Gender: F                  HR:           51 bpm. Exam Location:  [place] Procedure: 2D Echo, Cardiac Doppler and Color Doppler Indications:    Dyspnea  History:        Patient has prior history of Echocardiogram examinations. CHF,                  CAD and Angina, Arrythmias:Tachycardia; Risk                 Factors:Hypertension, Diabetes and Dyslipidemia.  Sonographer:    Luisa Hart RDCS Referring Phys: [place]  1. Left ventricular ejection fraction, by estimation, is 60 to 65%. The left ventricle has normal function. The left ventricle has no regional wall motion abnormalities. Left ventricular diastolic parameters were normal.  2. Right ventricular systolic function is normal. The right ventricular size is normal.  3. The mitral valve is normal in structure. Trivial mitral valve regurgitation. No evidence of mitral stenosis.  4. The aortic valve is normal in structure. Aortic valve regurgitation is mild. No aortic stenosis is present.  5. The inferior vena cava is normal in size with greater than 50% respiratory variability, suggesting right atrial pressure of 3 mmHg. FINDINGS  Left Ventricle: Left ventricular ejection fraction, by estimation, is 60 to 65%. The left ventricle has normal function. The left ventricle has no regional wall motion abnormalities. The left ventricular internal cavity size was normal in size. There is  no left ventricular hypertrophy. Left ventricular diastolic parameters were normal. Right Ventricle: The right ventricular size is normal. No increase in right ventricular wall thickness. Right ventricular systolic function is normal. Left Atrium: Left atrial size was normal in size. Right Atrium: Right atrial size was normal in size. Pericardium: There is no evidence of pericardial effusion. Mitral Valve: The mitral valve is normal in structure. Trivial mitral valve regurgitation. No evidence of mitral valve stenosis. Tricuspid Valve: The tricuspid valve is normal in structure. Tricuspid valve regurgitation is not demonstrated. No evidence of tricuspid stenosis. Aortic Valve: The aortic valve is normal in structure. Aortic valve regurgitation is mild. Aortic regurgitation PHT measures 1027 msec. No aortic  stenosis is present. Aortic valve mean gradient measures 4.5 mmHg. Aortic valve peak gradient measures 9.1 mmHg. Aortic valve area, by VTI measures 1.27 cm. Pulmonic Valve: The pulmonic valve was normal in structure. Pulmonic valve regurgitation is not visualized. No evidence of pulmonic stenosis. Aorta: The aortic root is normal in size and structure. Venous: The inferior vena cava is normal in size with greater than 50% respiratory variability, suggesting right atrial pressure of 3 mmHg. IAS/Shunts: No atrial level  shunt detected by color flow Doppler.  LEFT VENTRICLE PLAX 2D LVIDd:         4.38 cm     Diastology LVIDs:         2.36 cm     LV e' medial:    3.68 cm/s LV PW:         0.78 cm     LV E/e' medial:  19.6 LV IVS:        0.73 cm     LV e' lateral:   5.52 cm/s LVOT diam:     1.60 cm     LV E/e' lateral: 13.1 LV SV:         43 LV SV Index:   29 LVOT Area:     2.01 cm  LV Volumes (MOD) LV vol d, MOD A4C: 33.3 ml LV vol s, MOD A4C: 7.9 ml LV SV MOD A4C:     33.3 ml RIGHT VENTRICLE RV Basal diam:  2.17 cm    PULMONARY VEINS RV Mid diam:    0.74 cm    A Reversal Duration: 90.00 msec RV S prime:     9.81 cm/s  A Reversal Velocity: 26.80 cm/s TAPSE (M-mode): 2.0 cm     Diastolic Velocity:  [id number] cm/s                            S/D Velocity:        1.40                            Systolic Velocity:   [id number] cm/s LEFT ATRIUM             Index       RIGHT ATRIUM           Index LA diam:        4.00 cm 2.73 cm/m  RA Area:     10.70 cm LA Vol (A2C):   37.6 ml 25.62 ml/m RA Volume:   20.20 ml  13.76 ml/m LA Vol (A4C):   30.0 ml 20.44 ml/m LA Biplane Vol: 33.5 ml 22.82 ml/m  AORTIC VALVE                    PULMONIC VALVE AV Area (Vmax):    1.22 cm     PV Vmax:       0.84 m/s AV Area (Vmean):   1.25 cm     PV Vmean:      57.200 cm/s AV Area (VTI):     1.27 cm     PV VTI:        0.202 m AV Vmax:           151.00 cm/s  PV Peak grad:  2.9 mmHg AV Vmean:          100.100 cm/s PV Mean grad:  1.0 mmHg AV VTI:             0.336 m AV Peak Grad:      9.1 mmHg AV Mean Grad:      4.5 mmHg LVOT Vmax:         91.40 cm/s LVOT Vmean:        62.300 cm/s LVOT VTI:          0.212 m LVOT/AV VTI ratio: 0.63 AI PHT:            1027 msec  AORTA Ao Root diam: 2.70 cm Ao Asc diam:  2.70 cm MITRAL VALVE               TRICUSPID VALVE MV Area (PHT): 4.74 cm    TR Peak grad:   28.3 mmHg MV Decel Time: 160 msec    TR Vmax:        266.00 cm/s MV E velocity: 72.20 cm/s MV A velocity: 90.80 cm/s  SHUNTS MV E/A ratio:  0.80        Systemic VTI:  0.21 m                            Systemic Diam: 1.60 cm Jyl Heinz MD Electronically signed by Jyl Heinz MD Signature Date/Time: 01/13/2021/3:10:40 PM    Final     Physical Exam: Ht '4\' 10"'$  (1.473 m)   Wt 119 lb (54 kg)   BMI 24.87 kg/m  Constitutional: Pleasant,well-developed, female in no acute distress. HEENT: Normocephalic and atraumatic. Conjunctivae are normal. No scleral icterus. Neck supple.  Cardiovascular: Normal rate, regular rhythm.  Pulmonary/chest: Effort normal and breath sounds normal.  Abdominal: Soft, nondistended, mostly upper abdominal mild TTP but some mild tenderness everywhere. There are no masses palpable. Back pain reproduced with positional change. DRE - tenderness with DRE - stool in vault, no mass lesions, no obvious fissure- Tia Alert CMA as standby. Anoscopy not possible due to pain. Extremities: trace edema Lymphadenopathy: No cervical adenopathy noted. Neurological: Alert and oriented to person place and time. Skin: Skin is warm and dry. No rashes noted. Psychiatric: Normal mood and affect. Behavior is normal.   ASSESSMENT AND PLAN: 80 year old female here for reassessment of the following issues:  Abdominal pain Dyspepsia Hiatal hernia GERD Back pain Rectal pain  Patient presenting with acute on chronic symptoms.  She has had an extensive evaluation over the years with endoscopy and multiple imaging modalities fairly recently. Labs stable and  look okay.  She does have a moderate to large hiatal hernia with clear reflux which for the most part has controlled her reflux symptoms however having a lot of postprandial dyspepsia and discomfort with eating.  We discussed her options.  She has tested negative for gastroparesis but discussed empiric Reglan.  She wants to hold off on that and pursue an upper endoscopy to reassess her hiatal hernia and assess for [facility] lesions etc. given her worsening postprandial symptoms.  I discussed risk benefits of endoscopy and anesthesia and she wants to proceed.  Her recent echocardiogram looks good, she states her pulmonary status is stable and she denies any cardiopulmonary symptoms at this time.  Otherwise she has a lot of various pains in her abdomen.  I wonder if some of this is musculoskeletal and could be related to her back pain which has really been bothering her with radiation down her left leg.  She is waiting on an MRI this weekend to evaluate that.  I recommend she take her Bentyl schedule twice daily as that has often helps her abdominal pain and controlled her loose stools, she is not taking it very frequently.  I also gave her some IBgard samples to help with that as well.  Present dosing of PPI until her endoscopy with further recommendations.  In regards to her rectal discomfort I did not see any concerning pathology on her exam today however it was somewhat limited due to her pain.  Certainly could have a small fissure versus spasm of  the rectum.  In this light we will offer her some topical nitroglycerin ointment and see if that helps.  She agreed Of note, we held a few doses of her Lasix in light of her renal function from earlier in the week, will check BMET today to make sure stable.  Plan: - upper endoscopy - discussed empiric Reglan but she wishes to hold off. Not sure if cameron lesions / worsening hernia in light of symptoms - MRI back on Sunday - BMET today - adjust lasix PRN - Levsin  scheduled BID - trial of IB gard samples as well - continue PPI for now - trial of 0.125% topical nitroglycerin ointment empirically  Jolly Mango, MD [facility]

## 2021-02-01 ENCOUNTER — Encounter: Payer: Self-pay | Admitting: Family Medicine

## 2021-02-04 ENCOUNTER — Ambulatory Visit: Payer: Medicare HMO | Admitting: Gastroenterology

## 2021-02-05 DIAGNOSIS — G4733 Obstructive sleep apnea (adult) (pediatric): Secondary | ICD-10-CM | POA: Diagnosis not present

## 2021-02-09 ENCOUNTER — Other Ambulatory Visit: Payer: Self-pay

## 2021-02-09 ENCOUNTER — Telehealth: Payer: Self-pay | Admitting: Cardiology

## 2021-02-09 ENCOUNTER — Ambulatory Visit (INDEPENDENT_AMBULATORY_CARE_PROVIDER_SITE_OTHER): Payer: Medicare HMO | Admitting: Family Medicine

## 2021-02-09 VITALS — BP 118/64 | HR 67 | Temp 98.1°F | Resp 16 | Wt 119.4 lb

## 2021-02-09 DIAGNOSIS — E782 Mixed hyperlipidemia: Secondary | ICD-10-CM | POA: Diagnosis not present

## 2021-02-09 DIAGNOSIS — D649 Anemia, unspecified: Secondary | ICD-10-CM

## 2021-02-09 DIAGNOSIS — R739 Hyperglycemia, unspecified: Secondary | ICD-10-CM

## 2021-02-09 DIAGNOSIS — R079 Chest pain, unspecified: Secondary | ICD-10-CM

## 2021-02-09 DIAGNOSIS — I10 Essential (primary) hypertension: Secondary | ICD-10-CM | POA: Diagnosis not present

## 2021-02-09 DIAGNOSIS — R109 Unspecified abdominal pain: Secondary | ICD-10-CM

## 2021-02-09 DIAGNOSIS — E039 Hypothyroidism, unspecified: Secondary | ICD-10-CM

## 2021-02-09 NOTE — Progress Notes (Signed)
Subjective:   By signing my name below, I, Zite Okoli, attest that this documentation has been prepared under the direction and in the presence of [name], MD. 02/09/2021   Patient ID: Julie Wilkerson, female    DOB: 1940-08-25, 80 y.o.   MRN: [id number]  No chief complaint on file.   HPI Patient is in today for an office visit.  She went to see Dr Havery Moros for gastrointestinal problems and is having an upper endoscopy on Wednesday 09/21. She mentions that the abdominal pain is still the same and is exacerbated when she eats or goes to the toilet. She also mentions that she feels the pain radiating to all parts of her abdomen and has been managing it with Ibgard and 0.125 mg hyoscyamine.She mentions it provides great relief. She also reports being bloated and sometimes, the pain radiates down to her rectum and legs. She also reports that sometimes, the pain is present when she wakes up and she finds it hard to stand often. She also mentions a slight loss of appetite because she always feels full.  She also reports having frequent palpitations, chest pain that is located in the center of her chest and feels like pressure , fatigue and always wants to sleep. She also mentions she cant lay down on her left side because her left arm gets tender and goes numb. She will be seeing Dr Nelva Bush on Saturday for an MRI.  She also mentions she has stopped playing racquetball because it is a lot of pressure on her back because she has to twist her back a lot.  She has also been receiving conflicting reports about using Lasix because at her last blood work, her kidney functions were not good. She is still taking 40 mg Lasix PO daily.  She also mentions she hates using her CPAP machine and finds it hard to sleep when it is on. She was supposed to receive a follow-up call from pulmonology last month  but never received the call.   Past Medical History:  Diagnosis Date   Abdominal pain 11/07/2016    Accelerating angina ([place]) [id number]   Acute diastolic CHF (congestive heart failure) ([place]) 02/16/2019   Acute on chronic diastolic CHF (congestive heart failure) ([place]) 02/23/2019   Acute pansinusitis 04/26/2017   Amblyopia of eye, left 04/19/2018   Anemia    Anginal pain (HCC)    Anxiety    Arthritis    Asthma    Atypical chest pain 02/04/2019   Bilateral carotid bruits 03/28/2017   Blood transfusion without reported diagnosis    Breast cancer ([place])    2002, left, encapsulated, microcalcifications. lumpectomy, radtiation x 30   Breast cancer in female [facility])    Bursitis of left hip 09/04/2018   Change in bowel habits 02/22/2018   Change in mole 06/08/2016   Chest pain 03/04/2020   CHF (congestive heart failure) (HCC)    Chronic bilateral thoracic back pain 05/23/2016   Chronic diastolic congestive heart failure ([place]) 05/28/2019   Colitis    Complication of anesthesia    VERY SENSITIVE TO ANESTHESIA    Constipation 01/17/2018   Coronary artery disease involving native coronary artery of native heart without angina pectoris 05/28/2019   Cortical age-related cataract of right eye 04/18/2018   Cough variant asthma  vs UACS from ACEi     Spirometry 12/02/2016  FEV1 1.26 (76%)  Ratio 78 with min curvature p last saba > 6 h prior  - trial off  acei 12/02/2016  - 12/02/2016  After extensive coaching HFA effectiveness =    75% with qvar autohaler > rechallenge with 80 2bid     Decreased visual acuity 01/16/2017   Degeneration of lumbar intervertebral disc 08/01/2018   Diarrhea 11/16/2015   D/o Diverticuli, polyps, celiac disease.     Dyspnea 11/04/2016   with asthma exacerbation only   Dysuria 11/04/2016   Elevated sed rate 10/23/2017   Essential hypertension    Trial off acei 12/02/2016 due to cough/ pseudoasthma   GERD (gastroesophageal reflux disease)    Gluten intolerance    History of chicken pox    History of Helicobacter pylori infection 08/01/2015   History of rectal  bleeding 06/01/2019   Hx of LASIK 04/24/2019   Hyperglycemia 11/07/2016   Hyperlipidemia    Hypertension    Hyponatremia 02/16/2019   Hypothyroidism    IBS (irritable bowel syndrome)    Lattice degeneration of right retina 04/24/2019   Left hand pain 05/07/2018   Left hip pain 03/01/2017   Left leg pain 03/01/2017   Left shoulder pain 05/07/2018   Low back pain 08/10/2015   LUQ pain 06/01/2019   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   Neck pain 03/01/2017   Nuclear sclerotic cataract of right eye 04/18/2018   Osteoporosis    Overweight (BMI 25.0-29.9) 02/14/2019   Pain in joint of left shoulder 08/11/2018   Paroxysmal atrial tachycardia ([place]) 05/28/2019   Pelvic pain 07/02/2018   Personal history of radiation therapy    Pneumonia    PONV (postoperative nausea and vomiting)    Poor appetite 02/22/2018   Pseudophakia of left eye 04/18/2018   PVD (posterior vitreous detachment), right 04/24/2019   Restless sleeper 11/07/2016   Right hip pain 10/23/2017   RLQ discomfort 03/01/2017   RLS (restless legs syndrome) 05/07/2019   S/P left THA, AA 02/13/2019   Sepsis due to pneumonia ([place]) 02/16/2019   Sleep apnea    uses CPAP   Stomach cramps    Tremor 02/15/2016   Urinary frequency 12/22/2017   Urinary incontinence 05/07/2019   Urinary tract infection 11/04/2016   Vitamin D deficiency 04/29/2016    Past Surgical History:  Procedure Laterality Date   ABDOMINAL HYSTERECTOMY     partial   APPENDECTOMY     BREAST LUMPECTOMY Left 2002   CHOLECYSTECTOMY  Age 54 or 26   COLONOSCOPY  2017   EYE SURGERY Left    cataract   LEFT HEART CATH AND CORONARY ANGIOGRAPHY N/A 02/09/2019   Procedure: LEFT HEART CATH AND CORONARY ANGIOGRAPHY;  Surgeon: Nelva Bush, MD;  Location: [facility];  Service: Cardiovascular;  Laterality: N/A;   TONSILLECTOMY     TOTAL HIP ARTHROPLASTY Left 02/13/2019   Procedure: TOTAL HIP  ARTHROPLASTY ANTERIOR APPROACH;  Surgeon: Paralee Cancel, MD;  Location: [facility];  Service: Orthopedics;  Laterality: Left;  70 mins   UPPER GASTROINTESTINAL ENDOSCOPY      Family History  Problem Relation Age of Onset   Colitis Mother    Irritable bowel syndrome Mother    Hypertension Father    Hyperlipidemia Sister    Hyperlipidemia Brother    Heart attack Brother    Hyperlipidemia Brother    Heart attack Brother    Hyperlipidemia Brother    Heart attack Brother    Stomach cancer Paternal Grandmother 72   Colon cancer Paternal 25    Leukemia Daughter    Arthritis Daughter  Heart disease Daughter        ASD vs VSD   Esophageal cancer Neg Hx    Pancreatic cancer Neg Hx    Rectal cancer Neg Hx     Social History   Socioeconomic History   Marital status: Single    Spouse name: Not on file   Number of children: 3   Years of education: 3   Highest education level: Master's degree (e.g., MA, MS, MEng, MEd, MSW, MBA)  Occupational History   Occupation: retired    Comment: Pharmacist, hospital, kindergarten  Tobacco Use   Smoking status: Never   Smokeless tobacco: Never  Vaping Use   Vaping Use: Never used  Substance and Sexual Activity   Alcohol use: Never    Alcohol/week: 0.0 standard drinks   Drug use: Never   Sexual activity: Not Currently    Birth control/protection: None    Comment: lives alone, avoids daiiry and gluten. 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  Other Topics Concern   Not on file  Social History Narrative   Not on file   Social Determinants of Health   Financial Resource Strain: Low Risk    Difficulty of Paying Living Expenses: Not hard at all  Food Insecurity: No Food Insecurity   Worried About Charity fundraiser in the Last Year: Never true   Axis in the Last Year: Never true  Transportation Needs: No Transportation Needs   Lack of Transportation (Medical): No   Lack of Transportation (Non-Medical): No  Physical Activity: Insufficiently  Active   Days of Exercise per Week: 1 day   Minutes of Exercise per Session: 30 min  Stress: No Stress Concern Present   Feeling of Stress : Not at all  Social Connections: Not on file  Intimate Partner Violence: Not At Risk   Fear of Current or Ex-Partner: No   Emotionally Abused: No   Physically Abused: No   Sexually Abused: No    Outpatient Medications Prior to Visit  Medication Sig Dispense Refill   albuterol (VENTOLIN HFA) 108 (90 Base) MCG/ACT inhaler Inhale 2 puffs into the lungs every 6 (six) hours as needed for wheezing or shortness of breath. 18 g 2   ASPIRIN 81 PO 81 mg daily.      atorvastatin (LIPITOR) 20 MG tablet Take 1 tablet by mouth once daily 90 tablet 0   citalopram (CELEXA) 20 MG tablet TAKE 1 TABLET BY MOUTH AT BEDTIME 90 tablet 0   fluticasone (FLONASE) 50 MCG/ACT nasal spray Place 2 sprays into both nostrils daily as needed for allergies or rhinitis. 16 g 5   hyoscyamine (LEVSIN SL) 0.125 MG SL tablet Place 1 tablet (0.125 mg total) under the tongue 2 (two) times daily. 30 tablet 1   Iron, Ferrous Sulfate, 325 (65 Fe) MG TABS Take 325 mg by mouth daily. 30 tablet 3   levothyroxine (SYNTHROID) 50 MCG tablet Take 1 tablet (50 mcg total) by mouth daily before breakfast. 90 tablet 1   lisinopril (ZESTRIL) 10 MG tablet Take 1 tablet (10 mg total) by mouth 2 (two) times daily as needed. 180 tablet 1   metoprolol succinate (TOPROL-XL) 25 MG 24 hr tablet TAKE 1 TABLET BY MOUTH AT BEDTIME 90 tablet 0   omega-3 acid ethyl esters (LOVAZA) 1 g capsule Take 2 g by mouth daily.     ondansetron (ZOFRAN) 4 MG tablet Dissolve 1 tablet under tongue once to twice a day, every day 90 tablet 3   Peppermint Oil (  IBGARD) 90 MG CPCR Take as directed as needed 16 capsule 0   potassium chloride (KLOR-CON) 10 MEQ tablet Take 1 tablet by mouth once daily 90 tablet 0   sucralfate (CARAFATE) 1 g tablet Take 1 tablet (1 g total) by mouth 2 (two) times daily. 60 tablet 0   AMBULATORY NON  FORMULARY MEDICATION Medication Name: Nitroglycerine ointment 0.125 %  Apply a pea sized amount internally three times daily for 4 weeks or until symptoms have resolved Dispense 30 GM zero refill 30 g 0.NI   furosemide (LASIX) 40 MG tablet Take 1 tablet by mouth once daily 90 tablet 0   omeprazole (PRILOSEC) 40 MG capsule Take 1 capsule (40 mg total) by mouth in the morning and at bedtime. Take 1 tablet by mouth once to twice daily 60 capsule 5   No facility-administered medications prior to visit.    No Known Allergies  Review of Systems  Constitutional:  Positive for malaise/fatigue. Negative for fever.  HENT:  Negative for congestion.   Eyes:  Negative for redness.  Respiratory:  Negative for shortness of breath.   Cardiovascular:  Positive for chest pain and palpitations. Negative for leg swelling.  Gastrointestinal:  Positive for abdominal pain. Negative for blood in stool and nausea.       (+) rectal pain (+) bloating  Genitourinary:  Negative for dysuria, frequency and hematuria.  Musculoskeletal:  Negative for falls.  Skin:  Negative for rash.  Neurological:  Negative for dizziness, loss of consciousness and headaches.  Endo/Heme/Allergies:  Negative for polydipsia.  Psychiatric/Behavioral:  Negative for depression. The patient is not nervous/anxious.       Objective:    Physical Exam Constitutional:      General: She is not in acute distress.    Appearance: She is well-developed.  HENT:     Head: Normocephalic and atraumatic.  Eyes:     Conjunctiva/sclera: Conjunctivae normal.  Neck:     Thyroid: No thyromegaly.  Cardiovascular:     Rate and Rhythm: Normal rate and regular rhythm.     Heart sounds: Normal heart sounds. No murmur heard. Pulmonary:     Effort: Pulmonary effort is normal. No respiratory distress.     Breath sounds: Normal breath sounds.  Abdominal:     General: Bowel sounds are normal. There is no distension.     Palpations: Abdomen is soft.  There is no mass.     Tenderness: There is abdominal tenderness.  Musculoskeletal:     Cervical back: Neck supple.  Lymphadenopathy:     Cervical: No cervical adenopathy.  Skin:    General: Skin is warm and dry.  Neurological:     Mental Status: She is alert and oriented to person, place, and time.  Psychiatric:        Behavior: Behavior normal.    BP 118/64   Pulse 67   Temp 98.1 F (36.7 C)   Resp 16   Wt 119 lb 6.4 oz (54.2 kg)   SpO2 94%   BMI 24.95 kg/m  Wt Readings from Last 3 Encounters:  02/11/21 119 lb (54 kg)  02/09/21 119 lb 6.4 oz (54.2 kg)  01/30/21 119 lb (54 kg)    Diabetic Foot Exam - Simple   No data filed    Lab Results  Component Value Date   WBC 5.2 02/09/2021   HGB 11.3 (L) 02/09/2021   HCT 34.2 (L) 02/09/2021   PLT 224.0 02/09/2021   GLUCOSE 92 02/09/2021   CHOL  262 (H) 02/09/2021   TRIG 139.0 02/09/2021   HDL 72.00 02/09/2021   LDLCALC 162 (H) 02/09/2021   ALT 15 02/09/2021   AST 23 02/09/2021   NA 140 02/09/2021   K 4.3 02/09/2021   CL 102 02/09/2021   CREATININE 1.30 (H) 02/09/2021   BUN 34 (H) 02/09/2021   CO2 29 02/09/2021   TSH 2.69 02/09/2021   INR 1.2 02/16/2019   HGBA1C 5.8 05/29/2020    Lab Results  Component Value Date   TSH 2.69 02/09/2021   Lab Results  Component Value Date   WBC 5.2 02/09/2021   HGB 11.3 (L) 02/09/2021   HCT 34.2 (L) 02/09/2021   MCV 87.9 02/09/2021   PLT 224.0 02/09/2021   Lab Results  Component Value Date   NA 140 02/09/2021   K 4.3 02/09/2021   CO2 29 02/09/2021   GLUCOSE 92 02/09/2021   BUN 34 (H) 02/09/2021   CREATININE 1.30 (H) 02/09/2021   BILITOT 0.4 02/09/2021   ALKPHOS 67 02/09/2021   AST 23 02/09/2021   ALT 15 02/09/2021   PROT 7.2 02/09/2021   ALBUMIN 4.3 02/09/2021   CALCIUM 9.3 02/09/2021   ANIONGAP 10 12/23/2020   GFR 39.19 (L) 02/09/2021   Lab Results  Component Value Date   CHOL 262 (H) 02/09/2021   Lab Results  Component Value Date   HDL 72.00 02/09/2021    Lab Results  Component Value Date   LDLCALC 162 (H) 02/09/2021   Lab Results  Component Value Date   TRIG 139.0 02/09/2021   Lab Results  Component Value Date   CHOLHDL 4 02/09/2021   Lab Results  Component Value Date   HGBA1C 5.8 05/29/2020       Assessment & Plan:   Problem List Items Addressed This Visit     Essential hypertension (Chronic)    Well controlled, no changes to meds. Encouraged heart healthy diet such as the DASH diet and exercise as tolerated.       Relevant Orders   Lipid panel (Completed)   Hyperlipidemia    Encourage heart healthy diet such as MIND or DASH diet, increase exercise, avoid trans fats, simple carbohydrates and processed foods, consider a krill or fish or flaxseed oil cap daily.       Relevant Orders   CBC (Completed)   Comprehensive metabolic panel (Completed)   TSH (Completed)   Hypothyroid    On Levothyroxine, continue to monitor      Hyperglycemia    hgba1c acceptable, minimize simple carbs. Increase exercise as tolerated.       Abdominal pain    Gets relief with IGard and Hyoscyamine but pain returns. She is following with gastroenterology. Avoid offending foods.      Anemia    Increase leafy greens, consider increased lean red meat and using cast iron cookware. Continue to monitor, report any concerns. stable      Chest pain - Primary    She notes intermittent episodes of chest pressure. No symptoms today. She will seek care if returns and does not resolve      Relevant Orders   Ambulatory referral to Cardiology   Ambulatory referral to Cardiology   CBC (Completed)   Comprehensive metabolic panel (Completed)   TSH (Completed)     No orders of the defined types were placed in this encounter.   I,Zite Okoli,acting as a Education administrator for Penni Homans, MD.,have documented all relevant documentation on the behalf of Penni Homans, MD,as directed by  Penni Homans, MD while in the presence of Penni Homans, MD.   I,  [name], MD., personally preformed the services described in this documentation.  All medical record entries made by the scribe were at my direction and in my presence.  I have reviewed the chart and discharge instructions (if applicable) and agree that the record reflects my personal performance and is accurate and complete. 02/09/2021

## 2021-02-09 NOTE — Patient Instructions (Signed)
Chest Wall Pain Chest wall pain is pain in or around the bones and muscles of your chest. Sometimes, an injury causes this pain. Excessive coughing or overuse of arm and chest muscles may also cause chest wall pain. Sometimes, the cause may not be known. This pain may take several weeks or longer to get better. Follow these instructions at home: Managing pain, stiffness, and swelling  If directed, put ice on the painful area: Put ice in a plastic bag. Place a towel between your skin and the bag. Leave the ice on for 20 minutes, 2-3 times per day. Activity Rest as told by your health care provider. Avoid activities that cause pain. These include any activities that use your chest muscles or your abdominal and side muscles to lift heavy items. Ask your health care provider what activities are safe for you. General instructions  Take over-the-counter and prescription medicines only as told by your health care provider. Do not use any products that contain nicotine or tobacco, such as cigarettes, e-cigarettes, and chewing tobacco. These can delay healing after injury. If you need help quitting, ask your health care provider. Keep all follow-up visits as told by your health care provider. This is important. Contact a health care provider if: You have a fever. Your chest pain becomes worse. You have new symptoms. Get help right away if: You have nausea or vomiting. You feel sweaty or light-headed. You have a cough with mucus from your lungs (sputum) or you cough up blood. You develop shortness of breath. These symptoms may represent a serious problem that is an emergency. Do not wait to see if the symptoms will go away. Get medical help right away. Call your local emergency services (911 in the [place] Do not drive yourself to the hospital. Summary Chest wall pain is pain in or around the bones and muscles of your chest. Depending on the cause, it may be treated with ice, rest, medicines, and  avoiding activities that cause pain. Contact a health care provider if you have a fever, worsening chest pain, or new symptoms. Get help right away if you feel light-headed or you develop shortness of breath. These symptoms may be an emergency. This information is not intended to replace advice given to you by your health care provider. Make sure you discuss any questions you have with your health care provider. Document Revised: 07/25/2020 Document Reviewed: 07/25/2020 Elsevier Patient Education  [employer].

## 2021-02-09 NOTE — Telephone Encounter (Signed)
Pt is requesting a Provider Switch form Dr. Harriet Masson to Dr. Irish Lack.

## 2021-02-10 LAB — COMPREHENSIVE METABOLIC PANEL
ALT: 15 U/L (ref 0–35)
AST: 23 U/L (ref 0–37)
Albumin: 4.3 g/dL (ref 3.5–5.2)
Alkaline Phosphatase: 67 U/L (ref 39–117)
BUN: 34 mg/dL — ABNORMAL HIGH (ref 6–23)
CO2: 29 mEq/L (ref 19–32)
Calcium: 9.3 mg/dL (ref 8.4–10.5)
Chloride: 102 mEq/L (ref 96–112)
Creatinine, Ser: 1.3 mg/dL — ABNORMAL HIGH (ref 0.40–1.20)
GFR: 39.19 mL/min — ABNORMAL LOW (ref >60.00)
Glucose, Bld: 92 mg/dL (ref 70–99)
Potassium: 4.3 mEq/L (ref 3.5–5.1)
Sodium: 140 mEq/L (ref 135–145)
Total Bilirubin: 0.4 mg/dL (ref 0.2–1.2)
Total Protein: 7.2 g/dL (ref 6.0–8.3)

## 2021-02-10 LAB — LIPID PANEL
Cholesterol: 262 mg/dL — ABNORMAL HIGH (ref 0–200)
HDL: 72 mg/dL (ref >39.00)
LDL Cholesterol: 162 mg/dL — ABNORMAL HIGH (ref 0–99)
NonHDL: 189.99
Total CHOL/HDL Ratio: 4
Triglycerides: 139 mg/dL (ref 0.0–149.0)
VLDL: 27.8 mg/dL (ref 0.0–40.0)

## 2021-02-10 LAB — CBC
HCT: 34.2 % — ABNORMAL LOW (ref 36.0–46.0)
Hemoglobin: 11.3 g/dL — ABNORMAL LOW (ref 12.0–15.0)
MCHC: 33.1 g/dL (ref 30.0–36.0)
MCV: 87.9 fl (ref 78.0–100.0)
Platelets: 224 10*3/uL (ref 150.0–400.0)
RBC: 3.89 Mil/uL (ref 3.87–5.11)
RDW: 14.4 % (ref 11.5–15.5)
WBC: 5.2 10*3/uL (ref 4.0–10.5)

## 2021-02-10 LAB — TSH: TSH: 2.69 u[IU]/mL (ref 0.35–5.50)

## 2021-02-11 ENCOUNTER — Encounter: Payer: Self-pay | Admitting: Gastroenterology

## 2021-02-11 ENCOUNTER — Other Ambulatory Visit: Payer: Self-pay

## 2021-02-11 ENCOUNTER — Ambulatory Visit (AMBULATORY_SURGERY_CENTER): Payer: Medicare HMO | Admitting: Gastroenterology

## 2021-02-11 VITALS — BP 127/56 | HR 59 | Temp 98.0°F | Resp 10 | Ht <= 58 in | Wt 119.0 lb

## 2021-02-11 DIAGNOSIS — R1013 Epigastric pain: Secondary | ICD-10-CM | POA: Diagnosis not present

## 2021-02-11 DIAGNOSIS — K449 Diaphragmatic hernia without obstruction or gangrene: Secondary | ICD-10-CM

## 2021-02-11 DIAGNOSIS — K219 Gastro-esophageal reflux disease without esophagitis: Secondary | ICD-10-CM | POA: Diagnosis not present

## 2021-02-11 DIAGNOSIS — R109 Unspecified abdominal pain: Secondary | ICD-10-CM

## 2021-02-11 MED ORDER — SODIUM CHLORIDE 0.9 % IV SOLN: 500.0000 mL | INTRAVENOUS | Status: DC

## 2021-02-11 MED ORDER — OMEPRAZOLE 40 MG PO CPDR: 40.0000 mg | DELAYED_RELEASE_CAPSULE | Freq: Two times a day (BID) | ORAL | 5 refills | Status: DC

## 2021-02-11 NOTE — Patient Instructions (Signed)
YOU HAD AN ENDOSCOPIC PROCEDURE TODAY AT THE [facility]:   Refer to the procedure report that was given to you for any specific questions about what was found during the examination.  If the procedure report does not answer your questions, please call your gastroenterologist to clarify.  If you requested that your care partner not be given the details of your procedure findings, then the procedure report has been included in a sealed envelope for you to review at your convenience later.  YOU SHOULD EXPECT: Some feelings of bloating in the abdomen. Passage of more gas than usual.  Walking can help get rid of the air that was put into your GI tract during the procedure and reduce the bloating. If you had a lower endoscopy (such as a colonoscopy or flexible sigmoidoscopy) you may notice spotting of blood in your stool or on the toilet paper. If you underwent a bowel prep for your procedure, you may not have a normal bowel movement for a few days.  Please Note:  You might notice some irritation and congestion in your nose or some drainage.  This is from the oxygen used during your procedure.  There is no need for concern and it should clear up in a day or so.  SYMPTOMS TO REPORT IMMEDIATELY:   Following lower endoscopy (colonoscopy or flexible sigmoidoscopy):  Excessive amounts of blood in the stool  Significant tenderness or worsening of abdominal pains  Swelling of the abdomen that is new, acute  Fever of 100F or higher   Following upper endoscopy (EGD)  Vomiting of blood or coffee ground material  New chest pain or pain under the shoulder blades  Painful or persistently difficult swallowing  New shortness of breath  Fever of 100F or higher  Black, tarry-looking stools  For urgent or emergent issues, a gastroenterologist can be reached at any hour by calling [phone]. Do not use MyChart messaging for urgent concerns.    DIET:  We do recommend a small meal at first, but  then you may proceed to your regular diet.  Drink plenty of fluids but you should avoid alcoholic beverages for 24 hours.  ACTIVITY:  You should plan to take it easy for the rest of today and you should NOT DRIVE or use heavy machinery until tomorrow (because of the sedation medicines used during the test).    FOLLOW UP: Our staff will call the number listed on your records 48-72 hours following your procedure to check on you and address any questions or concerns that you may have regarding the information given to you following your procedure. If we do not reach you, we will leave a message.  We will attempt to reach you two times.  During this call, we will ask if you have developed any symptoms of COVID 19. If you develop any symptoms (ie: fever, flu-like symptoms, shortness of breath, cough etc.) before then, please call [phone].  If you test positive for Covid 19 in the 2 weeks post procedure, please call and report this information to us.    If any biopsies were taken you will be contacted by phone or by letter within the next 1-3 weeks.  Please call us at [phone] if you have not heard about the biopsies in 3 weeks.    SIGNATURES/CONFIDENTIALITY: You and/or your care partner have signed paperwork which will be entered into your electronic medical record.  These signatures attest to the fact that that the information above on   your After Visit Summary has been reviewed and is understood.  Full responsibility of the confidentiality of this discharge information lies with you and/or your care-partner. 

## 2021-02-11 NOTE — Progress Notes (Signed)
A/ox3, pleased with MAC, report to RN 

## 2021-02-11 NOTE — Progress Notes (Signed)
VS-DT 

## 2021-02-11 NOTE — Progress Notes (Signed)
Called to room to assist during endoscopic procedure.  Patient ID and intended procedure confirmed with present staff. Received instructions for my participation in the procedure from the performing physician.  

## 2021-02-11 NOTE — Progress Notes (Signed)
History and Physical Interval Note: Patient seen on 01/30/21 - no interval changes since I have last seen her. Symptoms persist. MRI back pending. Denies cardiopulmonary symptoms. Further recommendations pending results of this exam.   02/11/2021 11:04 AM  [name]  has presented today for endoscopic procedure(s), with the diagnosis of  Encounter Diagnoses  Name Primary?   Dyspepsia Yes   Abdominal pain, unspecified abdominal location    Gastroesophageal reflux disease, unspecified whether esophagitis present   .  The various methods of evaluation and treatment have been discussed with the patient and/or family. After consideration of risks, benefits and other options for treatment, the patient has consented to  the endoscopic procedure(s).   The patient's history has been reviewed, patient examined, no change in status, stable for surgery.  I have reviewed the patient's chart and labs.  Questions were answered to the patient's satisfaction.    Jolly Mango, MD [facility]

## 2021-02-11 NOTE — Op Note (Signed)
[facility] Patient Name: Julie Wilkerson Procedure Date: 02/11/2021 11:02 AM MRN: [id number] Endoscopist: Remo Lipps P. Havery Moros , MD Age: 80 Referring MD:  Date of Birth: 1940/05/31 Gender: Female Account #: [id number] Procedure:                Upper GI endoscopy Indications:              Upper abdominal pain, Dyspepsia, history of GERD -                            negative CT in May, GES negative, on PPI /                            carafate. Symptoms persist. Also with other lower                            abdominal pains, on Levsin / IB gard, ongoing back                            pain and awaiting MRI Medicines:                Monitored Anesthesia Care Procedure:                Pre-Anesthesia Assessment:                           - Prior to the procedure, a History and Physical                            was performed, and patient medications and                            allergies were reviewed. The patient's tolerance of                            previous anesthesia was also reviewed. The risks                            and benefits of the procedure and the sedation                            options and risks were discussed with the patient.                            All questions were answered, and informed consent                            was obtained. Prior Anticoagulants: The patient has                            taken no previous anticoagulant or antiplatelet                            agents. ASA Grade Assessment: III - A patient with  severe systemic disease. After reviewing the risks                            and benefits, the patient was deemed in                            satisfactory condition to undergo the procedure.                           After obtaining informed consent, the endoscope was                            passed under direct vision. Throughout the                            procedure, the patient's blood  pressure, pulse, and                            oxygen saturations were monitored continuously. The                            GIF [id number] #[id number] was introduced through the                            mouth, and advanced to the second part of duodenum.                            The upper GI endoscopy was accomplished without                            difficulty. The patient tolerated the procedure                            well. Scope In: Scope Out: Findings:                 Esophagogastric landmarks were identified: the                            Z-line was found at 32 cm, the gastroesophageal                            junction was found at 32 cm and the upper extent of                            the gastric folds was found at 37 cm from the                            incisors.                           A 5 cm hiatal hernia was present.                           The exam of the  esophagus was otherwise normal.                           The entire examined stomach was normal. Biopsies                            were taken with a cold forceps for Helicobacter                            pylori testing.                           The duodenal bulb and second portion of the                            duodenum were normal. Complications:            No immediate complications. Estimated blood loss:                            Minimal. Estimated Blood Loss:     Estimated blood loss was minimal. Impression:               - Esophagogastric landmarks identified.                           - 5 cm hiatal hernia.                           - Normal esophagus otherwise.                           - Normal stomach. Biopsied.                           - Normal duodenal bulb and second portion of the                            duodenum.                           Overall, no clear cause for refractory symptoms on                            this exam. Hiatal hernia is stable in size, no                             Cameron lesions. Will await biopsy results.                            Functional dyspepsia possible. Otherwise not sure                            if some of upper abdominal pain is referred from                            her ongoing back pain  and has some musculoskeletal                            component, MRI spine pending this weekend. Recommendation:           - Patient has a contact number available for                            emergencies. The signs and symptoms of potential                            delayed complications were discussed with the                            patient. Return to normal activities tomorrow.                            Written discharge instructions were provided to the                            patient.                           - Resume previous diet.                           - Continue present medications.                           - Await pathology results with further                            recommendations. Remo Lipps P. Lanea Vankirk, MD 02/11/2021 11:25:56 AM This report has been signed electronically.

## 2021-02-12 NOTE — Telephone Encounter (Signed)
Patient does not want to wait until January to see Dr. Irish Lack. I will add her on with Dr. Phineas Inches since pt would like to be seen in October.

## 2021-02-13 ENCOUNTER — Telehealth: Payer: Self-pay | Admitting: *Deleted

## 2021-02-13 NOTE — Assessment & Plan Note (Signed)
Well controlled, no changes to meds. Encouraged heart healthy diet such as the DASH diet and exercise as tolerated.  °

## 2021-02-13 NOTE — Assessment & Plan Note (Addendum)
Gets relief with IGard and Hyoscyamine but pain returns. She is following with gastroenterology. Avoid offending foods.

## 2021-02-13 NOTE — Assessment & Plan Note (Signed)
hgba1c acceptable, minimize simple carbs. Increase exercise as tolerated.  

## 2021-02-13 NOTE — Assessment & Plan Note (Signed)
On Levothyroxine, continue to monitor 

## 2021-02-13 NOTE — Assessment & Plan Note (Signed)
Encourage heart healthy diet such as MIND or DASH diet, increase exercise, avoid trans fats, simple carbohydrates and processed foods, consider a krill or fish or flaxseed oil cap daily.  °

## 2021-02-13 NOTE — Telephone Encounter (Signed)
  Follow up Call-  Call back number 02/11/2021 11/28/2019 08/14/2019 10/24/2018  Post procedure Call Back phone  # [phone]  Permission to leave phone message Yes Yes Yes -  Some recent data might be hidden     Patient questions:  Do you have a fever, pain , or abdominal swelling? No. Pain Score  0 *  Have you tolerated food without any problems? Yes.    Have you been able to return to your normal activities? Yes.    Do you have any questions about your discharge instructions: Diet   No. Medications  No. Follow up visit  No.  Do you have questions or concerns about your Care? No.  Actions: * If pain score is 4 or above: No action needed, pain <4.  Have you developed a fever since your procedure? no  2.   Have you had an respiratory symptoms (SOB or cough) since your procedure? no  3.   Have you tested positive for COVID 19 since your procedure no  4.   Have you had any family members/close contacts diagnosed with the COVID 19 since your procedure?  no   If yes to any of these questions please route to Joylene John, RN and Joella Prince, RN

## 2021-02-13 NOTE — Assessment & Plan Note (Signed)
She notes intermittent episodes of chest pressure. No symptoms today. She will seek care if returns and does not resolve

## 2021-02-13 NOTE — Assessment & Plan Note (Addendum)
Increase leafy greens, consider increased lean red meat and using cast iron cookware. Continue to monitor, report any concerns. stable 

## 2021-02-14 DIAGNOSIS — M5459 Other low back pain: Secondary | ICD-10-CM | POA: Diagnosis not present

## 2021-02-14 DIAGNOSIS — M542 Cervicalgia: Secondary | ICD-10-CM | POA: Diagnosis not present

## 2021-02-19 DIAGNOSIS — G4733 Obstructive sleep apnea (adult) (pediatric): Secondary | ICD-10-CM | POA: Diagnosis not present

## 2021-02-26 DIAGNOSIS — M5416 Radiculopathy, lumbar region: Secondary | ICD-10-CM | POA: Diagnosis not present

## 2021-02-26 DIAGNOSIS — M5136 Other intervertebral disc degeneration, lumbar region: Secondary | ICD-10-CM | POA: Diagnosis not present

## 2021-02-26 DIAGNOSIS — M5412 Radiculopathy, cervical region: Secondary | ICD-10-CM | POA: Diagnosis not present

## 2021-02-26 DIAGNOSIS — M5030 Other cervical disc degeneration, unspecified cervical region: Secondary | ICD-10-CM | POA: Diagnosis not present

## 2021-03-05 ENCOUNTER — Other Ambulatory Visit: Payer: Self-pay

## 2021-03-05 ENCOUNTER — Ambulatory Visit: Payer: Medicare HMO | Admitting: Pulmonary Disease

## 2021-03-05 ENCOUNTER — Encounter: Payer: Self-pay | Admitting: Pulmonary Disease

## 2021-03-05 VITALS — BP 116/70 | HR 60 | Ht <= 58 in | Wt 122.0 lb

## 2021-03-05 DIAGNOSIS — R0602 Shortness of breath: Secondary | ICD-10-CM

## 2021-03-05 DIAGNOSIS — J4520 Mild intermittent asthma, uncomplicated: Secondary | ICD-10-CM | POA: Diagnosis not present

## 2021-03-05 NOTE — Progress Notes (Signed)
Synopsis: Referred in October 2022 for evaluation of asthma by Penni Homans  Subjective:   PATIENT ID: Julie Wilkerson GENDER: female DOB: 1940-08-07, MRN: [id number]  HPI  Chief Complaint  Patient presents with   Consult    Referred by PCP for having a cough and SOB for the past 4 months. States the phlegm was green at times, never saw any blood. Only has the SOB with exertion.    Julie Wilkerson is a 80 year old woman, never smoker with history of hypertension, congestive heart failure, coronary artery disease and asthma who is referred to pulmonary clinic for evaluation of asthma.   She reports having cough for 4 months that has been treated with steroids and antibiotics with complete resolution. She does have some intermittent wheezing and exertional dyspnea. She denies seasonal allergies that affect her breathing. Cold air and cigarette smoke bother her breathing. She has an as needed albuterol inhaler but this causes her to shake and feel jittery.   Cough for 4 months. Several rounds of steroids and antibiotics.   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 lumpectomy, radtiation x 30   Breast cancer in female [facility])    Bursitis of left hip 09/04/2018   Change in bowel habits 02/22/2018   Change in mole 06/08/2016   Chest pain 03/04/2020   CHF (congestive heart failure) ([facility])    Chronic bilateral thoracic back pain 05/23/2016   Chronic diastolic  congestive heart failure ([place]) 05/28/2019   Colitis    Complication of anesthesia    VERY SENSITIVE TO ANESTHESIA    Constipation 01/17/2018   Coronary artery disease involving native coronary artery of native heart without angina pectoris 05/28/2019   Cortical age-related cataract of right eye 04/18/2018   Cough variant asthma  vs UACS from ACEi     Spirometry 12/02/2016  FEV1 1.26 (76%)  Ratio 78 with min curvature p last saba > 6 h prior  - trial off acei 12/02/2016  - 12/02/2016  After extensive coaching HFA effectiveness =    75% with qvar autohaler > rechallenge with 80 2bid     Decreased visual acuity 01/16/2017   Degeneration of lumbar intervertebral disc 08/01/2018   Diarrhea 11/16/2015   D/o Diverticuli, polyps, celiac disease.     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  Left hand pain 05/07/2018   Left hip pain 03/01/2017   Left leg pain 03/01/2017   Left shoulder pain 05/07/2018   Low back pain 08/10/2015   LUQ pain 06/01/2019   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   Neck pain 03/01/2017   Nuclear sclerotic cataract of right eye 04/18/2018   Osteoporosis    Overweight (BMI 25.0-29.9) 02/14/2019   Pain in joint of left shoulder 08/11/2018   Paroxysmal atrial tachycardia ([place]) 05/28/2019   Pelvic pain 07/02/2018   Personal history of radiation therapy    Pneumonia    PONV (postoperative  nausea and vomiting)    Poor appetite 02/22/2018   Pseudophakia of left eye 04/18/2018   PVD (posterior vitreous detachment), right 04/24/2019   Restless sleeper 11/07/2016   Right hip pain 10/23/2017   RLQ discomfort 03/01/2017   RLS (restless legs syndrome) 05/07/2019   S/P left THA, AA 02/13/2019   Sepsis due to pneumonia ([place]) 02/16/2019   Sleep apnea    uses CPAP   Stomach cramps    Tremor 02/15/2016   Urinary frequency 12/22/2017   Urinary incontinence 05/07/2019   Urinary tract infection 11/04/2016   Vitamin D deficiency 04/29/2016     Family History  Problem Relation Age of Onset   Colitis Mother    Irritable bowel syndrome Mother    Hypertension Father    Hyperlipidemia Sister    Hyperlipidemia Brother    Heart attack Brother    Hyperlipidemia Brother    Heart attack Brother    Hyperlipidemia Brother    Heart attack Brother    Stomach cancer Paternal Grandmother 66   Colon cancer Paternal Grandmother    Leukemia Daughter    Arthritis Daughter    Heart disease Daughter        ASD vs VSD   Esophageal cancer Neg Hx    Pancreatic cancer Neg Hx    Rectal cancer Neg Hx      Social History   Socioeconomic History   Marital status: Single    Spouse name: Not on file   Number of children: 3   Years of education: 18   Highest education level: Master's degree (e.g., MA, MS, MEng, MEd, MSW, MBA)  Occupational History   Occupation: retired    Comment: Pharmacist, hospital, kindergarten  Tobacco Use   Smoking status: Never   Smokeless tobacco: Never  Vaping Use   Vaping Use: Never used  Substance and Sexual Activity   Alcohol use: Never    Alcohol/week: 0.0 standard drinks   Drug use: Never   Sexual activity: Not Currently    Birth control/protection: None    Comment: lives alone, avoids daiiry and gluten. 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  Other Topics Concern   Not on file  Social History Narrative   Not on file   Social Determinants of Health   Financial Resource  Strain: Low Risk    Difficulty of Paying Living Expenses: Not hard at all  Food Insecurity: No Food Insecurity   Worried About Charity fundraiser in the Last Year: Never true   [place] in the Last Year: Never true  Transportation Needs: No Transportation Needs   Lack of Transportation (Medical): No   Lack of Transportation (Non-Medical): No  Physical Activity: Insufficiently Active   Days of Exercise per Week: 1 day   Minutes of Exercise per Session: 30 min  Stress: No Stress Concern Present   Feeling of Stress : Not at  all  Social Connections: Not on file  Intimate Partner Violence: Not At Risk   Fear of Current or Ex-Partner: No   Emotionally Abused: No   Physically Abused: No   Sexually Abused: No     No Known Allergies   Outpatient Medications Prior to Visit  Medication Sig Dispense Refill   albuterol (VENTOLIN HFA) 108 (90 Base) MCG/ACT inhaler Inhale 2 puffs into the lungs every 6 (six) hours as needed for wheezing or shortness of breath. 18 g 2   ASPIRIN 81 PO 81 mg daily.      citalopram (CELEXA) 20 MG tablet TAKE 1 TABLET BY MOUTH AT BEDTIME 90 tablet 0   hyoscyamine (LEVSIN SL) 0.125 MG SL tablet Place 1 tablet (0.125 mg total) under the tongue 2 (two) times daily. 30 tablet 1   levothyroxine (SYNTHROID) 50 MCG tablet Take 1 tablet (50 mcg total) by mouth daily before breakfast. 90 tablet 1   lisinopril (ZESTRIL) 10 MG tablet Take 1 tablet (10 mg total) by mouth 2 (two) times daily as needed. 180 tablet 1   metoprolol succinate (TOPROL-XL) 25 MG 24 hr tablet TAKE 1 TABLET BY MOUTH AT BEDTIME 90 tablet 0   NON FORMULARY as needed (wheezing, SOB). Iprasynt inhaler (unable to find english equivalent med name)     omega-3 acid ethyl esters (LOVAZA) 1 g capsule Take 2 g by mouth daily.     omeprazole (PRILOSEC) 40 MG capsule Take 1 capsule (40 mg total) by mouth in the morning and at bedtime. Take 1 tablet by mouth once to twice daily 60 capsule 5   ondansetron  (ZOFRAN) 4 MG tablet Dissolve 1 tablet under tongue once to twice a day, every day 90 tablet 3   potassium chloride (KLOR-CON) 10 MEQ tablet Take 1 tablet by mouth once daily 90 tablet 0   sucralfate (CARAFATE) 1 g tablet Take 1 tablet (1 g total) by mouth 2 (two) times daily. 60 tablet 0   atorvastatin (LIPITOR) 20 MG tablet Take 1 tablet by mouth once daily (Patient not taking: Reported on 03/16/2021) 90 tablet 0   fluticasone (FLONASE) 50 MCG/ACT nasal spray Place 2 sprays into both nostrils daily as needed for allergies or rhinitis. (Patient not taking: Reported on 03/16/2021) 16 g 5   Iron, Ferrous Sulfate, 325 (65 Fe) MG TABS Take 325 mg by mouth daily. (Patient not taking: Reported on 03/16/2021) 30 tablet 3   Peppermint Oil (IBGARD) 90 MG CPCR Take as directed as needed (Patient not taking: Reported on 03/16/2021) 16 capsule 0   No facility-administered medications prior to visit.    Review of Systems  Constitutional:  Negative for chills, fever, malaise/fatigue and weight loss.  HENT:  Negative for congestion, sinus pain and sore throat.   Eyes: Negative.   Respiratory:  Positive for shortness of breath and wheezing. Negative for cough, hemoptysis and sputum production.   Cardiovascular:  Positive for chest pain. Negative for palpitations, orthopnea, claudication and leg swelling.  Gastrointestinal:  Positive for abdominal pain. Negative for heartburn, nausea and vomiting.  Genitourinary: Negative.   Musculoskeletal:  Negative for joint pain and myalgias.  Skin:  Negative for rash.  Neurological:  Negative for weakness.  Endo/Heme/Allergies: Negative.   Psychiatric/Behavioral:  The patient is nervous/anxious.    Objective:   Vitals:   03/05/21 1434  BP: 116/70  Pulse: 60  SpO2: 95%  Weight: 122 lb (55.3 kg)  Height: 4\' 10"  (1.473 m)   Physical Exam Constitutional:  General: She is not in acute distress.    Appearance: She is not ill-appearing.  HENT:     Head:  Normocephalic and atraumatic.  Eyes:     General: No scleral icterus.    Conjunctiva/sclera: Conjunctivae normal.     Pupils: Pupils are equal, round, and reactive to light.  Cardiovascular:     Rate and Rhythm: Normal rate and regular rhythm.     Pulses: Normal pulses.     Heart sounds: Normal heart sounds. No murmur heard. Pulmonary:     Effort: Pulmonary effort is normal.     Breath sounds: Normal breath sounds. No wheezing, rhonchi or rales.  Abdominal:     General: Bowel sounds are normal.     Palpations: Abdomen is soft.  Musculoskeletal:     Right lower leg: No edema.     Left lower leg: No edema.  Lymphadenopathy:     Cervical: No cervical adenopathy.  Skin:    General: Skin is warm and dry.  Neurological:     General: No focal deficit present.     Mental Status: She is alert.  Psychiatric:        Mood and Affect: Mood normal.        Behavior: Behavior normal.        Thought Content: Thought content normal.        Judgment: Judgment normal.   CBC    Component Value Date/Time   WBC 5.2 02/09/2021 1548   RBC 3.89 02/09/2021 1548   HGB 11.3 (L) 02/09/2021 1548   HGB 11.0 (L) 02/05/2019 1058   HCT 34.2 (L) 02/09/2021 1548   HCT 33.7 (L) 02/05/2019 1058   PLT 224.0 02/09/2021 1548   PLT 227 02/05/2019 1058   MCV 87.9 02/09/2021 1548   MCV 89 02/05/2019 1058   MCH 29.8 12/23/2020 1118   MCHC 33.1 02/09/2021 1548   RDW 14.4 02/09/2021 1548   RDW 14.0 02/05/2019 1058   LYMPHSABS 1.7 01/27/2021 1444   MONOABS 0.6 01/27/2021 1444   EOSABS 0.0 01/27/2021 1444   BASOSABS 0.0 01/27/2021 1444   BMP Latest Ref Rng & Units 02/09/2021 01/30/2021 01/27/2021  Glucose 70 - 99 mg/dL 92 93 131(H)  BUN 6 - 23 mg/dL 34(H) 41(H) 34(H)  Creatinine 0.40 - 1.20 mg/dL 1.30(H) 1.26(H) 1.44(H)  BUN/Creat Ratio 6 - 22 (calc) - - -  Sodium 135 - 145 mEq/L 140 138 137  Potassium 3.5 - 5.1 mEq/L 4.3 4.2 4.3  Chloride 96 - 112 mEq/L 102 105 104  CO2 19 - 32 mEq/L 29 24 24   Calcium 8.4 -  10.5 mg/dL 9.3 9.3 8.6   Chest imaging: CTA Chest 12/23/20 Negative for significant acute pulmonary embolus by CTA. No other acute intrathoracic vascular finding. Diffuse bilateral patchy ground-glass opacities, nonspecific. See above comment. Moderate hiatal hernia Aortic Atherosclerosis  PFT: No flowsheet data found. Spirometry 2018 FVC: 1.6L (72%) FEV1: 1.2L (76%) FEV1/FVC 78%  Labs:  Path:  Echo 01/13/21: LV EF 60-65%. LV diastolic parameters are normal. RV systolic function is normal. RV size is normal.  No valvular issues.   Heart Catheterization:  Assessment & Plan:   Mild intermittent asthma without complication  Shortness of breath - Plan: Pulmonary Function Test, CT Chest High Resolution  Discussion: Julie Wilkerson is a 80 year old woman, never smoker with history of hypertension, congestive heart failure, coronary artery disease and asthma who is referred to pulmonary clinic for evaluation of asthma.   She does have reactive  airways symptoms concerning for mild intermittent asthma. She is not able to tolerate albuterol due to side effects of shaking.   We will obtain pulmonary function tests and high resolution CT chest scan for further evaluation of her shortness of breath and reduced FEV1 and FVC from spirometry in 2018.   Follow up in 4 weeks.   Freda Jackson, MD [facility] Office: [phone]   Current Outpatient Medications:    albuterol (VENTOLIN HFA) 108 (90 Base) MCG/ACT inhaler, Inhale 2 puffs into the lungs every 6 (six) hours as needed for wheezing or shortness of breath., Disp: 18 g, Rfl: 2   ASPIRIN 81 PO, 81 mg daily. , Disp: , Rfl:    citalopram (CELEXA) 20 MG tablet, TAKE 1 TABLET BY MOUTH AT BEDTIME, Disp: 90 tablet, Rfl: 0   hyoscyamine (LEVSIN SL) 0.125 MG SL tablet, Place 1 tablet (0.125 mg total) under the tongue 2 (two) times daily., Disp: 30 tablet, Rfl: 1   levothyroxine (SYNTHROID) 50 MCG tablet, Take 1 tablet  (50 mcg total) by mouth daily before breakfast., Disp: 90 tablet, Rfl: 1   lisinopril (ZESTRIL) 10 MG tablet, Take 1 tablet (10 mg total) by mouth 2 (two) times daily as needed., Disp: 180 tablet, Rfl: 1   metoprolol succinate (TOPROL-XL) 25 MG 24 hr tablet, TAKE 1 TABLET BY MOUTH AT BEDTIME, Disp: 90 tablet, Rfl: 0   NON FORMULARY, as needed (wheezing, SOB). Iprasynt inhaler (unable to find english equivalent med name), Disp: , Rfl:    omega-3 acid ethyl esters (LOVAZA) 1 g capsule, Take 2 g by mouth daily., Disp: , Rfl:    omeprazole (PRILOSEC) 40 MG capsule, Take 1 capsule (40 mg total) by mouth in the morning and at bedtime. Take 1 tablet by mouth once to twice daily, Disp: 60 capsule, Rfl: 5   ondansetron (ZOFRAN) 4 MG tablet, Dissolve 1 tablet under tongue once to twice a day, every day, Disp: 90 tablet, Rfl: 3   potassium chloride (KLOR-CON) 10 MEQ tablet, Take 1 tablet by mouth once daily, Disp: 90 tablet, Rfl: 0   sucralfate (CARAFATE) 1 g tablet, Take 1 tablet (1 g total) by mouth 2 (two) times daily., Disp: 60 tablet, Rfl: 0   erythromycin ophthalmic ointment, SMARTSIG:In Eye(s), Disp: , Rfl:

## 2021-03-05 NOTE — Patient Instructions (Addendum)
We check pulmonary function tests in 1 month at follow up  We will check a repeat CT Chest scan in early November  Continue to use albuterol as needed for wheezing or shortness

## 2021-03-15 NOTE — Progress Notes (Signed)
Cardiology Office Note:    Date:  03/15/2021   ID:  Julie Wilkerson, DOB September 20, 1940, MRN [id number]  PCP:  [name], MD   [place] [facility] Cardiologist:  Berniece Salines, DO     Referring MD: [name], MD   No chief complaint on file. Chest pain  History of Present Illness:    Julie Wilkerson is a 80 y.o. female with a hx of HTN, non obstructive CAD, central sleep apnea, hypothyroidism,  comes in today for chest pain in her chest and side.Her left arm gets numb. This has been going on for 1 year. Sometimes it is two days in a row, then it stops.It lasts for seconds to minutes. She rubs it and moves her arm. That improves it. He was getting speciality messages that helped with the pain. During the day she does house cleaning , walks in the neighborhood. She has back pain. She noted LH,dizzy sometimes with headaches. No dyspnea on exertion. She can walk up a flight of stairs fine. Her BP at home 120s. No orthopnea, PND, no LE edema. She cannot sleep with her CPAP.  Cardiac Hx: She underwent cath 02/09/2019 prior to hip arthroplasty procedure. She had mild-moderate non obstructive dx. No evidence of myocardial bridge. Normal filling pressures. Saw Dr. Claiborne Billings for a sleep study who recommended CPAP.    Cardiology Studies:  TTE 01/13/21 Normal LV fxn Normal RV fxn No valve dx Normal IVC  Cardiac Monitor: palpitations 04/03/2019 9 SVTlongest 13 beats. No atrial fibrillation, no block. No VT.  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 lumpectomy, radtiation x 30   Breast cancer in female [facility])    Bursitis of left hip 09/04/2018   Change in bowel habits 02/22/2018   Change in mole 06/08/2016   Chest pain 03/04/2020   CHF (congestive heart failure) (HCC)    Chronic bilateral thoracic back pain 05/23/2016   Chronic diastolic congestive heart failure ([place]) 05/28/2019   Colitis    Complication of anesthesia    VERY SENSITIVE TO ANESTHESIA    Constipation 01/17/2018   Coronary artery disease involving native coronary artery of native heart without angina pectoris 05/28/2019   Cortical age-related cataract of right eye 04/18/2018   Cough variant asthma  vs UACS from ACEi     Spirometry 12/02/2016  FEV1 1.26 (76%)  Ratio 78 with min curvature p last saba > 6 h prior  - trial off acei 12/02/2016  - 12/02/2016  After extensive coaching HFA effectiveness =    75% with qvar autohaler > rechallenge with 80 2bid     Decreased visual acuity 01/16/2017   Degeneration of lumbar intervertebral disc 08/01/2018   Diarrhea 11/16/2015   D/o Diverticuli, polyps, celiac disease.     Dyspnea 11/04/2016   with asthma exacerbation only   Dysuria 11/04/2016   Elevated sed  rate 10/23/2017   Essential hypertension    Trial off acei 12/02/2016 due to cough/ pseudoasthma   GERD (gastroesophageal reflux disease)    Gluten intolerance    History of chicken pox    History of Helicobacter pylori infection 08/01/2015   History of rectal bleeding 06/01/2019   Hx of LASIK 04/24/2019   Hyperglycemia 11/07/2016   Hyperlipidemia    Hypertension    Hyponatremia 02/16/2019   Hypothyroidism    IBS (irritable bowel syndrome)    Lattice degeneration of right retina 04/24/2019   Left hand pain 05/07/2018   Left hip pain 03/01/2017   Left leg pain 03/01/2017   Left shoulder pain  05/07/2018   Low back pain 08/10/2015   LUQ pain 06/01/2019   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   Neck pain 03/01/2017   Nuclear sclerotic cataract of right eye 04/18/2018   Osteoporosis    Overweight (BMI 25.0-29.9) 02/14/2019   Pain in joint of left shoulder 08/11/2018   Paroxysmal atrial tachycardia ([place]) 05/28/2019   Pelvic pain 07/02/2018   Personal history of radiation therapy    Pneumonia    PONV (postoperative nausea and vomiting)    Poor appetite 02/22/2018   Pseudophakia of left eye 04/18/2018   PVD (posterior vitreous detachment), right 04/24/2019   Restless sleeper 11/07/2016   Right hip pain 10/23/2017   RLQ discomfort 03/01/2017   RLS (restless legs syndrome) 05/07/2019   S/P left THA, AA 02/13/2019   Sepsis due to pneumonia ([place]) 02/16/2019   Sleep apnea    uses CPAP   Stomach cramps    Tremor 02/15/2016   Urinary frequency 12/22/2017   Urinary incontinence 05/07/2019   Urinary tract infection 11/04/2016   Vitamin D deficiency 04/29/2016    Past Surgical History:  Procedure Laterality Date   ABDOMINAL HYSTERECTOMY     partial   APPENDECTOMY     BREAST LUMPECTOMY Left 2002   CHOLECYSTECTOMY  Age 89 or 75   COLONOSCOPY  2017   EYE SURGERY Left    cataract   LEFT HEART CATH AND CORONARY ANGIOGRAPHY N/A 02/09/2019   Procedure: LEFT HEART CATH AND CORONARY ANGIOGRAPHY;  Surgeon: Nelva Bush, MD;  Location: [facility];  Service: Cardiovascular;  Laterality: N/A;   TONSILLECTOMY     TOTAL HIP ARTHROPLASTY Left 02/13/2019   Procedure: TOTAL HIP ARTHROPLASTY ANTERIOR APPROACH;  Surgeon: Paralee Cancel, MD;  Location: [facility];  Service: Orthopedics;  Laterality: Left;  70 mins   UPPER GASTROINTESTINAL ENDOSCOPY      Current Medications: No outpatient medications have been marked as taking for the 03/16/21 encounter (Appointment) with Janina Mayo, MD.     Allergies:   Patient has no known  allergies.   Social History   Socioeconomic History   Marital status: Single    Spouse name: Not on file   Number of children: 3   Years of education: 102   Highest education level: Master's degree (e.g., MA, MS, MEng, MEd, MSW, MBA)  Occupational History   Occupation: retired    Comment: Pharmacist, hospital, kindergarten  Tobacco Use   Smoking status: Never   Smokeless tobacco: Never  Vaping Use   Vaping Use: Never used  Substance and Sexual Activity   Alcohol use: Never    Alcohol/week: 0.0 standard drinks   Drug use: Never   Sexual activity: Not Currently    Birth control/protection: None    Comment: lives alone, avoids daiiry and gluten. volunteers with  children  Other Topics Concern   Not on file  Social History Narrative   Not on file   Social Determinants of Health   Financial Resource Strain: Low Risk    Difficulty of Paying Living Expenses: Not hard at all  Food Insecurity: No Food Insecurity   Worried About Charity fundraiser in the Last Year: Never true   [place] in the Last Year: Never true  Transportation Needs: No Transportation Needs   Lack of Transportation (Medical): No   Lack of Transportation (Non-Medical): No  Physical Activity: Insufficiently Active   Days of Exercise per Week: 1 day   Minutes of Exercise per Session: 30 min  Stress: No Stress Concern Present   Feeling of Stress : Not at all  Social Connections: Not on file     Family History: The patient's family history includes Arthritis in her daughter; Colitis in her mother; Colon cancer in her paternal grandmother; Heart attack in her brother, brother, and brother; Heart disease in her daughter; Hyperlipidemia in her brother, brother, brother, and sister; Hypertension in her father; Irritable bowel syndrome in her mother; Leukemia in her daughter; Stomach cancer (age of onset: 30) in her paternal grandmother. There is no history of Esophageal cancer, Pancreatic cancer, or Rectal cancer.  ROS:    Please see the history of present illness.     All other systems reviewed and are negative.  EKGs/Labs/Other Studies Reviewed:    The following studies were reviewed today:   EKG:  EKG is  ordered today.  The ekg ordered today demonstrates   Sinus bradycardia,non specific ST-T changes  Recent Labs: 12/23/2020: B Natriuretic Peptide 190.5 02/09/2021: ALT 15; BUN 34; Creatinine, Ser 1.30; Hemoglobin 11.3; Platelets 224.0; Potassium 4.3; Sodium 140; TSH 2.69   Recent Lipid Panel    Component Value Date/Time   CHOL 262 (H) 02/09/2021 1548   TRIG 139.0 02/09/2021 1548   HDL 72.00 02/09/2021 1548   CHOLHDL 4 02/09/2021 1548   VLDL 27.8 02/09/2021 1548   LDLCALC 162 (H) 02/09/2021 1548   LDLCALC 79 02/01/2020 1020     Risk Assessment/Calculations:           Physical Exam:    VS:   Vitals:   03/16/21 1029  BP: 138/60  Pulse: (!) 51  SpO2: 97%     Wt Readings from Last 3 Encounters:  03/05/21 122 lb (55.3 kg)  02/11/21 119 lb (54 kg)  02/09/21 119 lb 6.4 oz (54.2 kg)     GEN:  Well nourished, well developed in no acute distress HEENT: Normal NECK: No JVD; No carotid bruits CARDIAC: RRR, no murmurs, rubs, gallops RESPIRATORY:  Clear to auscultation without rales, wheezing or rhonchi  ABDOMEN: Soft, non-tender, non-distended MUSCULOSKELETAL:  No edema; No deformity  SKIN: Warm and dry NEUROLOGIC:  Alert and oriented x 3 PSYCHIATRIC:  Normal affect   ASSESSMENT:    #MSK pain: considering the pain is producible, not associated with activity, it is very atypical.  Her recent echo was normal. We discussed the possibility of MSK pain and continued massages. I let her know that I will continue to follow her.  PLAN:    In order of problems listed above:  Follow up 3 months   Medication Adjustments/Labs and Tests Ordered: Current medicines are reviewed at length with the patient today.  Concerns regarding medicines are outlined above.    Signed, Janina Mayo, MD  03/15/2021 10:42 AM    [facility]

## 2021-03-16 ENCOUNTER — Encounter: Payer: Self-pay | Admitting: Internal Medicine

## 2021-03-16 ENCOUNTER — Ambulatory Visit: Payer: Medicare HMO | Admitting: Internal Medicine

## 2021-03-16 ENCOUNTER — Other Ambulatory Visit: Payer: Self-pay

## 2021-03-16 VITALS — BP 138/60 | HR 51 | Ht <= 58 in | Wt 119.0 lb

## 2021-03-16 DIAGNOSIS — I2510 Atherosclerotic heart disease of native coronary artery without angina pectoris: Secondary | ICD-10-CM | POA: Diagnosis not present

## 2021-03-16 NOTE — Patient Instructions (Signed)
Medication Instructions:  Your Physician recommend you continue on your current medication as directed.    *If you need a refill on your cardiac medications before your next appointment, please call your pharmacy*   Lab Work: None ordered today   Testing/Procedures: None ordered today   Follow-Up: At [facility], you and your health needs are our priority.  As part of our continuing mission to provide you with exceptional heart care, we have created designated Provider Care Teams.  These Care Teams include your primary Cardiologist (physician) and Advanced Practice Providers (APPs -  Physician Assistants and Nurse Practitioners) who all work together to provide you with the care you need, when you need it.  We recommend signing up for the patient portal called "MyChart".  Sign up information is provided on this After Visit Summary.  MyChart is used to connect with patients for Virtual Visits (Telemedicine).  Patients are able to view lab/test results, encounter notes, upcoming appointments, etc.  Non-urgent messages can be sent to your provider as well.   To learn more about what you can do with MyChart, go to [email or web address].    Your next appointment:   3 month(s)  The format for your next appointment:   In Person  Provider:   Phineas Inches, MD

## 2021-03-17 ENCOUNTER — Encounter: Payer: Self-pay | Admitting: Family Medicine

## 2021-03-17 ENCOUNTER — Ambulatory Visit (INDEPENDENT_AMBULATORY_CARE_PROVIDER_SITE_OTHER): Payer: Medicare HMO

## 2021-03-17 DIAGNOSIS — Z23 Encounter for immunization: Secondary | ICD-10-CM

## 2021-03-21 DIAGNOSIS — G4733 Obstructive sleep apnea (adult) (pediatric): Secondary | ICD-10-CM | POA: Diagnosis not present

## 2021-03-23 ENCOUNTER — Telehealth: Payer: Self-pay | Admitting: Physician Assistant

## 2021-03-23 NOTE — Telephone Encounter (Signed)
New Message:      Patient said she thought Anderson Malta wanted her to see Dr Clinic in the [facility]. She will need a referral from [facility] to see Dr Claiborne Billings.

## 2021-03-24 NOTE — Telephone Encounter (Signed)
Good Morning, Our sleep scheduler is out today so im not able to schedule this pt.. could someone follow up with her.Marland KitchenMarland Kitchen

## 2021-03-25 ENCOUNTER — Ambulatory Visit (INDEPENDENT_AMBULATORY_CARE_PROVIDER_SITE_OTHER): Payer: Medicare HMO | Admitting: Pulmonary Disease

## 2021-03-25 ENCOUNTER — Other Ambulatory Visit: Payer: Self-pay

## 2021-03-25 DIAGNOSIS — R0602 Shortness of breath: Secondary | ICD-10-CM

## 2021-03-25 NOTE — Progress Notes (Signed)
PFT done today. 

## 2021-03-28 ENCOUNTER — Encounter: Payer: Self-pay | Admitting: Pulmonary Disease

## 2021-03-29 LAB — PULMONARY FUNCTION TEST
DL/VA % pred: 99 %
DL/VA: 4.34 ml/min/mmHg/L
DLCO cor % pred: 82 %
DLCO cor: 11.62 ml/min/mmHg
DLCO unc % pred: 76 %
DLCO unc: 10.75 ml/min/mmHg
FEF 25-75 Post: 2.27 L/sec
FEF 25-75 Pre: 1.55 L/sec
FEF2575-%Change-Post: 46 %
FEF2575-%Pred-Post: 221 %
FEF2575-%Pred-Pre: 150 %
FEV1-%Change-Post: 7 %
FEV1-%Pred-Post: 116 %
FEV1-%Pred-Pre: 108 %
FEV1-Post: 1.47 L
FEV1-Pre: 1.38 L
FEV1FVC-%Change-Post: 4 %
FEV1FVC-%Pred-Pre: 113 %
FEV6-%Change-Post: 2 %
FEV6-%Pred-Post: 103 %
FEV6-%Pred-Pre: 100 %
FEV6-Post: 1.68 L
FEV6-Pre: 1.63 L
FEV6FVC-%Change-Post: 0 %
FEV6FVC-%Pred-Post: 107 %
FEV6FVC-%Pred-Pre: 106 %
FVC-%Change-Post: 2 %
FVC-%Pred-Post: 96 %
FVC-%Pred-Pre: 94 %
FVC-Post: 1.68 L
FVC-Pre: 1.64 L
Post FEV1/FVC ratio: 88 %
Post FEV6/FVC ratio: 100 %
Pre FEV1/FVC ratio: 84 %
Pre FEV6/FVC Ratio: 100 %
RV % pred: 70 %
RV: 1.4 L
TLC % pred: 81 %
TLC: 3.14 L

## 2021-03-30 ENCOUNTER — Ambulatory Visit: Payer: Medicare HMO | Admitting: Pulmonary Disease

## 2021-03-30 ENCOUNTER — Ambulatory Visit: Payer: Medicare HMO | Admitting: Cardiovascular Disease

## 2021-03-30 ENCOUNTER — Other Ambulatory Visit: Payer: Self-pay

## 2021-03-30 ENCOUNTER — Encounter: Payer: Self-pay | Admitting: Pulmonary Disease

## 2021-03-30 VITALS — BP 132/62 | HR 50 | Temp 98.3°F | Ht <= 58 in | Wt 119.0 lb

## 2021-03-30 DIAGNOSIS — J4520 Mild intermittent asthma, uncomplicated: Secondary | ICD-10-CM

## 2021-03-30 MED ORDER — FLUTICASONE PROPIONATE HFA 110 MCG/ACT IN AERO: 1.0000 | INHALATION_SPRAY | Freq: Two times a day (BID) | RESPIRATORY_TRACT | 12 refills | Status: DC | PRN

## 2021-03-30 NOTE — Patient Instructions (Signed)
Avoid albuterol or levalbuterol inhalers or nebulizer treatments due to your adverse reaction to these medications.   Use flovent 174mcg 1-2 puffs twice daily as needed for cough, shortness of breath or wheezing.   Your pulmonary function tests normal.

## 2021-03-30 NOTE — Progress Notes (Signed)
Synopsis: Referred in October 2022 for evaluation of asthma by Penni Homans  Subjective:   PATIENT ID: Julie Wilkerson GENDER: female DOB: 1940/06/10, MRN: [id number]  HPI  Chief Complaint  Patient presents with   Follow-up    Asthma is doing much better now   Julie Wilkerson is a 80 year old woman, never smoker with history of hypertension, congestive heart failure, coronary artery disease and asthma who returns to pulmonary clinic for asthma.   She had pulmonary function tests on 11/2 which were within normal limits but she had reaction to the levalbuterol medication with increased anxiety, tremors, chest tightness and palpitations. This resolved with time.   She has no further complaints since last visit.   OV 03/05/21 She reports having cough for 4 months that has been treated with steroids and antibiotics with complete resolution. She does have some intermittent wheezing and exertional dyspnea. She denies seasonal allergies that affect her breathing. Cold air and cigarette smoke bother her breathing. She has an as needed albuterol inhaler but this causes her to shake and feel jittery.   Cough for 4 months. Several rounds of steroids and antibiotics.   Past Medical History:  Diagnosis Date   Abdominal pain 11/07/2016   Accelerating angina ([place]) 40/12/6759   Acute diastolic CHF (congestive heart failure) ([place]) 02/16/2019   Acute on chronic diastolic CHF (congestive heart failure) ([place]) 02/23/2019   Acute pansinusitis 04/26/2017   Amblyopia of eye, left 04/19/2018   Anemia    Anginal pain (HCC)    Anxiety    Arthritis    Asthma    Atypical chest pain 02/04/2019   Bilateral carotid bruits 03/28/2017   Blood transfusion without reported diagnosis    Breast cancer ([place])    2002, left, encapsulated, microcalcifications. lumpectomy, radtiation x 30   Breast cancer in female [facility])    Bursitis of left hip 09/04/2018   Change in bowel habits 02/22/2018   Change in mole  06/08/2016   Chest pain 03/04/2020   CHF (congestive heart failure) (HCC)    Chronic bilateral thoracic back pain 05/23/2016   Chronic diastolic congestive heart failure ([place]) 05/28/2019   Colitis    Complication of anesthesia    VERY SENSITIVE TO ANESTHESIA    Constipation 01/17/2018   Coronary artery disease involving native coronary artery of native heart without angina pectoris 05/28/2019   Cortical age-related cataract of right eye 04/18/2018   Cough variant asthma  vs UACS from ACEi     Spirometry 12/02/2016  FEV1 1.26 (76%)  Ratio 78 with min curvature p last saba > 6 h prior  - trial off acei 12/02/2016  - 12/02/2016  After extensive coaching HFA effectiveness =    75% with qvar autohaler > rechallenge with 80 2bid     Decreased visual acuity 01/16/2017   Degeneration of lumbar intervertebral disc 08/01/2018   Diarrhea 11/16/2015   D/o Diverticuli, polyps, celiac disease.     Dyspnea 11/04/2016   with asthma exacerbation only   Dysuria 11/04/2016   Elevated sed rate 10/23/2017   Essential hypertension    Trial off acei 12/02/2016 due to cough/ pseudoasthma   GERD (gastroesophageal reflux disease)    Gluten intolerance    History of chicken pox    History of Helicobacter pylori infection 08/01/2015   History of rectal bleeding 06/01/2019   Hx of LASIK 04/24/2019   Hyperglycemia 11/07/2016   Hyperlipidemia    Hypertension    Hyponatremia 02/16/2019   Hypothyroidism  IBS (irritable bowel syndrome)    Lattice degeneration of right retina 04/24/2019   Left hand pain 05/07/2018   Left hip pain 03/01/2017   Left leg pain 03/01/2017   Left shoulder pain 05/07/2018   Low back pain 08/10/2015   LUQ pain 06/01/2019   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   Neck pain 03/01/2017   Nuclear sclerotic cataract of right eye 04/18/2018   Osteoporosis    Overweight (BMI 25.0-29.9) 02/14/2019   Pain in joint of left shoulder 08/11/2018    Paroxysmal atrial tachycardia ([place]) 05/28/2019   Pelvic pain 07/02/2018   Personal history of radiation therapy    Pneumonia    PONV (postoperative nausea and vomiting)    Poor appetite 02/22/2018   Pseudophakia of left eye 04/18/2018   PVD (posterior vitreous detachment), right 04/24/2019   Restless sleeper 11/07/2016   Right hip pain 10/23/2017   RLQ discomfort 03/01/2017   RLS (restless legs syndrome) 05/07/2019   S/P left THA, AA 02/13/2019   Sepsis due to pneumonia ([place]) 02/16/2019   Sleep apnea    uses CPAP   Stomach cramps    Tremor 02/15/2016   Urinary frequency 12/22/2017   Urinary incontinence 05/07/2019   Urinary tract infection 11/04/2016   Vitamin D deficiency 04/29/2016     Family History  Problem Relation Age of Onset   Colitis Mother    Irritable bowel syndrome Mother    Hypertension Father    Hyperlipidemia Sister    Hyperlipidemia Brother    Heart attack Brother    Hyperlipidemia Brother    Heart attack Brother    Hyperlipidemia Brother    Heart attack Brother    Stomach cancer Paternal Grandmother 84   Colon cancer Paternal Grandmother    Leukemia Daughter    Arthritis Daughter    Heart disease Daughter        ASD vs VSD   Esophageal cancer Neg Hx    Pancreatic cancer Neg Hx    Rectal cancer Neg Hx      Social History   Socioeconomic History   Marital status: Single    Spouse name: Not on file   Number of children: 3   Years of education: 18   Highest education level: Master's degree (e.g., MA, MS, MEng, MEd, MSW, MBA)  Occupational History   Occupation: retired    Comment: Pharmacist, hospital, kindergarten  Tobacco Use   Smoking status: Never   Smokeless tobacco: Never  Vaping Use   Vaping Use: Never used  Substance and Sexual Activity   Alcohol use: Never    Alcohol/week: 0.0 standard drinks   Drug use: Never   Sexual activity: Not Currently    Birth control/protection: None    Comment: lives alone, avoids daiiry and gluten. 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  Other Topics Concern   Not on file  Social History Narrative   Not on file   Social Determinants of Health   Financial Resource Strain: Low Risk    Difficulty of Paying Living Expenses: Not hard at all  Food Insecurity: No Food Insecurity   Worried About Charity fundraiser in the Last Year: Never true   [place] in the Last Year: Never true  Transportation Needs: No Transportation Needs   Lack of Transportation (Medical): No   Lack of Transportation (Non-Medical): No  Physical Activity: Insufficiently Active   Days of Exercise per Week: 1 day   Minutes of Exercise per Session: 30  min  Stress: No Stress Concern Present   Feeling of Stress : Not at all  Social Connections: Not on file  Intimate Partner Violence: Not At Risk   Fear of Current or Ex-Partner: No   Emotionally Abused: No   Physically Abused: No   Sexually Abused: No     Allergies  Allergen Reactions   Albuterol And Levalbuterol Palpitations    Episode of palpitations, tremor and anxiety when administered levalbuterol for PFT. Recommend avoiding this medicaiton class     Outpatient Medications Prior to Visit  Medication Sig Dispense Refill   ASPIRIN 81 PO 81 mg daily.      citalopram (CELEXA) 20 MG tablet TAKE 1 TABLET BY MOUTH AT BEDTIME 90 tablet 0   erythromycin ophthalmic ointment SMARTSIG:In Eye(s)     hyoscyamine (LEVSIN SL) 0.125 MG SL tablet Place 1 tablet (0.125 mg total) under the tongue 2 (two) times daily. 30 tablet 1   levothyroxine (SYNTHROID) 50 MCG tablet Take 1 tablet (50 mcg total) by mouth daily before breakfast. 90 tablet 1   lisinopril (ZESTRIL) 10 MG tablet Take 1 tablet (10 mg total) by mouth 2 (two) times daily as needed. 180 tablet 1   metoprolol succinate (TOPROL-XL) 25 MG 24 hr tablet TAKE 1 TABLET BY MOUTH AT BEDTIME 90 tablet 0   NON FORMULARY as needed (wheezing, SOB). Iprasynt inhaler (unable to find english equivalent med name)     omega-3 acid ethyl esters  (LOVAZA) 1 g capsule Take 2 g by mouth daily.     omeprazole (PRILOSEC) 40 MG capsule Take 1 capsule (40 mg total) by mouth in the morning and at bedtime. Take 1 tablet by mouth once to twice daily 60 capsule 5   ondansetron (ZOFRAN) 4 MG tablet Dissolve 1 tablet under tongue once to twice a day, every day 90 tablet 3   potassium chloride (KLOR-CON) 10 MEQ tablet Take 1 tablet by mouth once daily 90 tablet 0   sucralfate (CARAFATE) 1 g tablet Take 1 tablet (1 g total) by mouth 2 (two) times daily. 60 tablet 0   albuterol (VENTOLIN HFA) 108 (90 Base) MCG/ACT inhaler Inhale 2 puffs into the lungs every 6 (six) hours as needed for wheezing or shortness of breath. 18 g 2   No facility-administered medications prior to visit.    Review of Systems  Constitutional:  Negative for chills, fever, malaise/fatigue and weight loss.  HENT:  Negative for congestion, sinus pain and sore throat.   Eyes: Negative.   Respiratory:  Negative for cough, hemoptysis, sputum production, shortness of breath and wheezing.   Cardiovascular:  Negative for chest pain, palpitations, orthopnea, claudication and leg swelling.  Gastrointestinal:  Negative for abdominal pain, heartburn, nausea and vomiting.  Genitourinary: Negative.   Musculoskeletal:  Negative for joint pain and myalgias.  Skin:  Negative for rash.  Neurological:  Negative for weakness.  Endo/Heme/Allergies: Negative.   Psychiatric/Behavioral:  The patient is not nervous/anxious.    Objective:   Vitals:   03/30/21 1113  BP: 132/62  Pulse: (!) 50  Temp: 98.3 F (36.8 C)  TempSrc: Oral  SpO2: 95%  Weight: 119 lb (54 kg)  Height: 4\' 10"  (1.473 m)   Physical Exam Constitutional:      General: She is not in acute distress.    Appearance: She is not ill-appearing.  HENT:     Head: Normocephalic and atraumatic.  Eyes:     General: No scleral icterus.    Conjunctiva/sclera: Conjunctivae normal.  Pupils: Pupils are equal, round, and reactive  to light.  Cardiovascular:     Rate and Rhythm: Normal rate and regular rhythm.     Pulses: Normal pulses.     Heart sounds: Normal heart sounds. No murmur heard. Pulmonary:     Effort: Pulmonary effort is normal.     Breath sounds: Normal breath sounds. No wheezing, rhonchi or rales.  Musculoskeletal:     Right lower leg: No edema.     Left lower leg: No edema.  Skin:    General: Skin is warm and dry.  Neurological:     General: No focal deficit present.     Mental Status: She is alert.   CBC    Component Value Date/Time   WBC 5.2 02/09/2021 1548   RBC 3.89 02/09/2021 1548   HGB 11.3 (L) 02/09/2021 1548   HGB 11.0 (L) 02/05/2019 1058   HCT 34.2 (L) 02/09/2021 1548   HCT 33.7 (L) 02/05/2019 1058   PLT 224.0 02/09/2021 1548   PLT 227 02/05/2019 1058   MCV 87.9 02/09/2021 1548   MCV 89 02/05/2019 1058   MCH 29.8 12/23/2020 1118   MCHC 33.1 02/09/2021 1548   RDW 14.4 02/09/2021 1548   RDW 14.0 02/05/2019 1058   LYMPHSABS 1.7 01/27/2021 1444   MONOABS 0.6 01/27/2021 1444   EOSABS 0.0 01/27/2021 1444   BASOSABS 0.0 01/27/2021 1444   BMP Latest Ref Rng & Units 02/09/2021 01/30/2021 01/27/2021  Glucose 70 - 99 mg/dL 92 93 131(H)  BUN 6 - 23 mg/dL 34(H) 41(H) 34(H)  Creatinine 0.40 - 1.20 mg/dL 1.30(H) 1.26(H) 1.44(H)  BUN/Creat Ratio 6 - 22 (calc) - - -  Sodium 135 - 145 mEq/L 140 138 137  Potassium 3.5 - 5.1 mEq/L 4.3 4.2 4.3  Chloride 96 - 112 mEq/L 102 105 104  CO2 19 - 32 mEq/L 29 24 24   Calcium 8.4 - 10.5 mg/dL 9.3 9.3 8.6   Chest imaging: CTA Chest 12/23/20 Negative for significant acute pulmonary embolus by CTA. No other acute intrathoracic vascular finding. Diffuse bilateral patchy ground-glass opacities, nonspecific. See above comment. Moderate hiatal hernia Aortic Atherosclerosis  PFT: PFT Results Latest Ref Rng & Units 03/25/2021  FVC-Pre L 1.64  FVC-Predicted Pre % 94  FVC-Post L 1.68  FVC-Predicted Post % 96  Pre FEV1/FVC % % 84  Post FEV1/FCV % % 88   FEV1-Pre L 1.38  FEV1-Predicted Pre % 108  FEV1-Post L 1.47  DLCO uncorrected ml/min/mmHg 10.75  DLCO UNC% % 76  DLCO corrected ml/min/mmHg 11.62  DLCO COR %Predicted % 82  DLVA Predicted % 99  TLC L 3.14  TLC % Predicted % 81  RV % Predicted % 70  PFT 2022 is within normal limits.   Spirometry 2018 FVC: 1.6L (72%) FEV1: 1.2L (76%) FEV1/FVC 78%  Labs:  Path:  Echo 01/13/21: LV EF 60-65%. LV diastolic parameters are normal. RV systolic function is normal. RV size is normal.  No valvular issues.   Heart Catheterization:  Assessment & Plan:   Mild intermittent asthma without complication  Discussion: Cheryn Lundquist is a 80 year old woman, never smoker with history of hypertension, congestive heart failure, coronary artery disease and asthma who returns to pulmonary for asthma.   She has mild interrmittent asthma. We will prescribe her flovent 111mcg inhaler to be used 1-2 puffs twice daily a needed due to her intolerance of albuterol or levalbuterol.   Her pulmonary function tests are within normal limits.  Follow up in  1 year or as needed.  Freda Jackson, MD [facility] Office: [phone]   Current Outpatient Medications:    ASPIRIN 81 PO, 81 mg daily. , Disp: , Rfl:    citalopram (CELEXA) 20 MG tablet, TAKE 1 TABLET BY MOUTH AT BEDTIME, Disp: 90 tablet, Rfl: 0   erythromycin ophthalmic ointment, SMARTSIG:In Eye(s), Disp: , Rfl:    hyoscyamine (LEVSIN SL) 0.125 MG SL tablet, Place 1 tablet (0.125 mg total) under the tongue 2 (two) times daily., Disp: 30 tablet, Rfl: 1   levothyroxine (SYNTHROID) 50 MCG tablet, Take 1 tablet (50 mcg total) by mouth daily before breakfast., Disp: 90 tablet, Rfl: 1   lisinopril (ZESTRIL) 10 MG tablet, Take 1 tablet (10 mg total) by mouth 2 (two) times daily as needed., Disp: 180 tablet, Rfl: 1   metoprolol succinate (TOPROL-XL) 25 MG 24 hr tablet, TAKE 1 TABLET BY MOUTH AT BEDTIME, Disp: 90 tablet, Rfl: 0   NON  FORMULARY, as needed (wheezing, SOB). Iprasynt inhaler (unable to find english equivalent med name), Disp: , Rfl:    omega-3 acid ethyl esters (LOVAZA) 1 g capsule, Take 2 g by mouth daily., Disp: , Rfl:    omeprazole (PRILOSEC) 40 MG capsule, Take 1 capsule (40 mg total) by mouth in the morning and at bedtime. Take 1 tablet by mouth once to twice daily, Disp: 60 capsule, Rfl: 5   ondansetron (ZOFRAN) 4 MG tablet, Dissolve 1 tablet under tongue once to twice a day, every day, Disp: 90 tablet, Rfl: 3   potassium chloride (KLOR-CON) 10 MEQ tablet, Take 1 tablet by mouth once daily, Disp: 90 tablet, Rfl: 0   sucralfate (CARAFATE) 1 g tablet, Take 1 tablet (1 g total) by mouth 2 (two) times daily., Disp: 60 tablet, Rfl: 0

## 2021-03-31 DIAGNOSIS — M5416 Radiculopathy, lumbar region: Secondary | ICD-10-CM | POA: Diagnosis not present

## 2021-03-31 DIAGNOSIS — M5459 Other low back pain: Secondary | ICD-10-CM | POA: Diagnosis not present

## 2021-04-03 ENCOUNTER — Ambulatory Visit: Payer: Medicare HMO | Admitting: Cardiovascular Disease

## 2021-04-06 ENCOUNTER — Other Ambulatory Visit: Payer: Self-pay | Admitting: Family Medicine

## 2021-04-14 ENCOUNTER — Telehealth: Payer: Self-pay | Admitting: Family Medicine

## 2021-04-14 NOTE — Telephone Encounter (Signed)
Called to clarify with pt first and she said she doesn't need brand only so it was ok to switch. done

## 2021-04-14 NOTE — Telephone Encounter (Signed)
[facility] called and stated they need permission to switch manufactures on the medicine below.  levothyroxine (SYNTHROID) 50 MCG tablet   [facility] [address] [place], [place] - [place], [place] [address]  Phone:  [phone]  Fax:  [phone]

## 2021-04-15 ENCOUNTER — Encounter: Payer: Self-pay | Admitting: Internal Medicine

## 2021-04-15 ENCOUNTER — Encounter: Payer: Self-pay | Admitting: Cardiovascular Disease

## 2021-04-15 ENCOUNTER — Ambulatory Visit: Payer: Medicare HMO | Admitting: Cardiovascular Disease

## 2021-04-15 ENCOUNTER — Other Ambulatory Visit: Payer: Self-pay

## 2021-04-15 ENCOUNTER — Ambulatory Visit (INDEPENDENT_AMBULATORY_CARE_PROVIDER_SITE_OTHER): Payer: Medicare HMO | Admitting: Internal Medicine

## 2021-04-15 VITALS — BP 122/76 | HR 76 | Temp 97.9°F | Resp 16 | Ht <= 58 in | Wt 117.0 lb

## 2021-04-15 DIAGNOSIS — G4734 Idiopathic sleep related nonobstructive alveolar hypoventilation: Secondary | ICD-10-CM

## 2021-04-15 DIAGNOSIS — I2510 Atherosclerotic heart disease of native coronary artery without angina pectoris: Secondary | ICD-10-CM

## 2021-04-15 DIAGNOSIS — E039 Hypothyroidism, unspecified: Secondary | ICD-10-CM | POA: Diagnosis not present

## 2021-04-15 DIAGNOSIS — B349 Viral infection, unspecified: Secondary | ICD-10-CM | POA: Diagnosis not present

## 2021-04-15 DIAGNOSIS — I10 Essential (primary) hypertension: Secondary | ICD-10-CM | POA: Diagnosis not present

## 2021-04-15 DIAGNOSIS — R0683 Snoring: Secondary | ICD-10-CM | POA: Diagnosis not present

## 2021-04-15 DIAGNOSIS — K219 Gastro-esophageal reflux disease without esophagitis: Secondary | ICD-10-CM

## 2021-04-15 DIAGNOSIS — G4733 Obstructive sleep apnea (adult) (pediatric): Secondary | ICD-10-CM | POA: Diagnosis not present

## 2021-04-15 LAB — POCT INFLUENZA A/B
Influenza A, POC: NEGATIVE
Influenza B, POC: NEGATIVE

## 2021-04-15 NOTE — Patient Instructions (Signed)
Medication Instructions:  Your Physician recommend you continue on your current medication as directed.    *If you need a refill on your cardiac medications before your next appointment, please call your pharmacy*   Lab Work: None   Testing/Procedures: None.   Follow-Up: At [facility], you and your health needs are our priority.  As part of our continuing mission to provide you with exceptional heart care, we have created designated Provider Care Teams.  These Care Teams include your primary Cardiologist (physician) and Advanced Practice Providers (APPs -  Physician Assistants and Nurse Practitioners) who all work together to provide you with the care you need, when you need it.  We recommend signing up for the patient portal called "MyChart".  Sign up information is provided on this After Visit Summary.  MyChart is used to connect with patients for Virtual Visits (Telemedicine).  Patients are able to view lab/test results, encounter notes, upcoming appointments, etc.  Non-urgent messages can be sent to your provider as well.   To learn more about what you can do with MyChart, go to [email or web address].    Your next appointment:   12 month(s)  The format for your next appointment:   In Person  Provider:   Dr. Corky Downs, MD

## 2021-04-15 NOTE — Progress Notes (Signed)
Cardiology Office Note    Date:  04/22/2021   ID:  Julie Wilkerson, DOB [birth year], MRN [id number]  PCP:  [name], MD  Cardiologist:  Shelva Majestic, MD   New sleep evaluation  History of Present Illness:  Julie Wilkerson is a 80 y.o. female who was born in [place] been in [place] for the last 42.  She sees Dr. Willette Alma for primary care.  And remotely had seen Dr. Eulas Post in [place] and has seen Phineas Inches for cardiology evaluation.  She has a history of hypertension, nonobstructive CAD, hypothyroidism, and at times has had some atypical chest pain.  Due to concerns for obstructive sleep apnea, she was originally referred by Dr. Berniece Salines for a home sleep study which was done on April 15, 2020.  This was positive for moderate overall sleep apnea with an AHI of 18.1 but it the overall severity may have been underestimated since supine sleep was absent on the study and also the severity during REM sleep could not be assessed.  She did have severe oxygen desaturation to nadir of 76%.  He subsequently underwent an in lab CPAP titration study on May 28, 2020 at [place] lab.  CPAP was initiated at 6 cm and was titrated only to 9 cm of water.  At 9 cm AHI was 4.1/h and O2 nadir was still 86.  Apparently, due to supply chain issues, she did not receive her CPAP which is a ResMed air sense 11 AutoSet unit until January 19, 2021.  Apparently, initially she met compliance on the download from October 3 to March 15, 2021 and had 100% use and average usage 6 hours and 15 minutes.  Her pressure was set at a range of of 8 to 14 cm and her 95th percentile pressure was 12.9 with a maximum average pressure of 13.5.  AHI was 4.4.  Apparently, and had significant difficulty with her mask, and subsequently received a N 30i small mask but has been having difficulty with this and as result was unable to sleep with CPAP.  She is seen in the office today for her initial  evaluation.  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 lumpectomy, radtiation x 30   Breast cancer in female [facility])    Bursitis of left hip 09/04/2018   Change in bowel habits 02/22/2018   Change in mole 06/08/2016   Chest pain 03/04/2020   CHF (congestive heart failure) (HCC)    Chronic bilateral thoracic back pain 05/23/2016   Chronic diastolic congestive heart failure ([place]) 05/28/2019   Colitis    Complication of anesthesia    VERY SENSITIVE TO ANESTHESIA    Constipation 01/17/2018   Coronary artery disease involving native coronary artery of native heart without angina pectoris 05/28/2019   Cortical age-related cataract of right eye 04/18/2018   Cough variant asthma  vs UACS from ACEi  Spirometry 12/02/2016  FEV1 1.26 (76%)  Ratio 78 with min curvature p last saba > 6 h prior  - trial off acei 12/02/2016  - 12/02/2016  After extensive coaching HFA effectiveness =    75% with qvar autohaler > rechallenge with 80 2bid     Decreased visual acuity 01/16/2017   Degeneration of lumbar intervertebral disc 08/01/2018   Diarrhea 11/16/2015   D/o Diverticuli, polyps, celiac disease.     Dyspnea 11/04/2016   with asthma exacerbation only   Dysuria 11/04/2016   Elevated sed rate 10/23/2017   Essential hypertension    Trial off acei 12/02/2016 due to cough/ pseudoasthma   GERD (gastroesophageal reflux disease)    Gluten intolerance    History of chicken pox     History of Helicobacter pylori infection 08/01/2015   History of rectal bleeding 06/01/2019   Hx of LASIK 04/24/2019   Hyperglycemia 11/07/2016   Hyperlipidemia    Hypertension    Hyponatremia 02/16/2019   Hypothyroidism    IBS (irritable bowel syndrome)    Lattice degeneration of right retina 04/24/2019   Left hand pain 05/07/2018   Left hip pain 03/01/2017   Left leg pain 03/01/2017   Left shoulder pain 05/07/2018   Low back pain 08/10/2015   LUQ pain 06/01/2019   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   Neck pain 03/01/2017   Nuclear sclerotic cataract of right eye 04/18/2018   Osteoporosis    Overweight (BMI 25.0-29.9) 02/14/2019   Pain in joint of left shoulder 08/11/2018   Paroxysmal atrial tachycardia ([place]) 05/28/2019   Pelvic pain 07/02/2018   Personal history of radiation therapy    Pneumonia    PONV (postoperative nausea and vomiting)    Poor appetite 02/22/2018   Pseudophakia of left eye 04/18/2018   PVD (posterior vitreous detachment), right 04/24/2019   Restless sleeper 11/07/2016   Right hip pain 10/23/2017   RLQ discomfort 03/01/2017   RLS (restless legs syndrome) 05/07/2019   S/P left THA, AA 02/13/2019   Sepsis due to pneumonia ([place]) 02/16/2019   Sleep apnea    uses CPAP   Stomach cramps    Tremor 02/15/2016   Urinary frequency 12/22/2017   Urinary incontinence 05/07/2019   Urinary tract infection 11/04/2016   Vitamin D deficiency 04/29/2016    Past Surgical History:  Procedure Laterality Date   ABDOMINAL HYSTERECTOMY     partial   APPENDECTOMY     BREAST LUMPECTOMY Left 2002   CHOLECYSTECTOMY  Age 50 or 46   COLONOSCOPY  2017   EYE SURGERY Left    cataract   LEFT HEART CATH AND CORONARY ANGIOGRAPHY N/A 02/09/2019   Procedure: LEFT HEART CATH AND CORONARY ANGIOGRAPHY;  Surgeon: Nelva Bush, MD;  Location: [facility];  Service: Cardiovascular;  Laterality: N/A;   TONSILLECTOMY      TOTAL HIP ARTHROPLASTY Left 02/13/2019   Procedure: TOTAL HIP ARTHROPLASTY ANTERIOR APPROACH;  Surgeon: Paralee Cancel, MD;  Location: [facility];  Service: Orthopedics;  Laterality: Left;  70 mins   UPPER GASTROINTESTINAL ENDOSCOPY      Current Medications: Outpatient Medications Prior to Visit  Medication Sig Dispense Refill   ASPIRIN 81 PO 81 mg daily.      citalopram (CELEXA) 20 MG tablet TAKE 1 TABLET BY MOUTH AT BEDTIME 90 tablet 0   erythromycin ophthalmic ointment SMARTSIG:In Eye(s)     fluticasone (FLOVENT HFA) 110 MCG/ACT inhaler Inhale 1-2 puffs into the lungs 2 (two) times  daily as needed. 1 each 12   hyoscyamine (LEVSIN SL) 0.125 MG SL tablet Place 1 tablet (0.125 mg total) under the tongue 2 (two) times daily. 30 tablet 1   levothyroxine (SYNTHROID) 50 MCG tablet TAKE 1 TABLET BY MOUTH ONCE DAILY BEFORE BREAKFAST 90 tablet 0   lisinopril (ZESTRIL) 10 MG tablet Take 1 tablet by mouth twice daily as needed 180 tablet 0   metoprolol succinate (TOPROL-XL) 25 MG 24 hr tablet TAKE 1 TABLET BY MOUTH AT BEDTIME 90 tablet 0   NON FORMULARY as needed (wheezing, SOB). Iprasynt inhaler (unable to find english equivalent med name)     omega-3 acid ethyl esters (LOVAZA) 1 g capsule Take 2 g by mouth daily.     omeprazole (PRILOSEC) 40 MG capsule Take 1 capsule (40 mg total) by mouth in the morning and at bedtime. Take 1 tablet by mouth once to twice daily 60 capsule 5   ondansetron (ZOFRAN) 4 MG tablet Dissolve 1 tablet under tongue once to twice a day, every day 90 tablet 3   potassium chloride (KLOR-CON) 10 MEQ tablet Take 1 tablet by mouth once daily 90 tablet 0   sucralfate (CARAFATE) 1 g tablet Take 1 tablet (1 g total) by mouth 2 (two) times daily. 60 tablet 0   No facility-administered medications prior to visit.     Allergies:   Albuterol and levalbuterol   Social History   Socioeconomic History   Marital status: Single    Spouse name: Not on file   Number of children: 3   Years  of education: 22   Highest education level: Master's degree (e.g., MA, MS, MEng, MEd, MSW, MBA)  Occupational History   Occupation: retired    Comment: Pharmacist, hospital, kindergarten  Tobacco Use   Smoking status: Never   Smokeless tobacco: Never  Vaping Use   Vaping Use: Never used  Substance and Sexual Activity   Alcohol use: Never    Alcohol/week: 0.0 standard drinks   Drug use: Never   Sexual activity: Not Currently    Birth control/protection: None    Comment: lives alone, avoids daiiry and gluten. 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  Other Topics Concern   Not on file  Social History Narrative   Not on file   Social Determinants of Health   Financial Resource Strain: Low Risk    Difficulty of Paying Living Expenses: Not hard at all  Food Insecurity: No Food Insecurity   Worried About Charity fundraiser in the Last Year: Never true   [place] in the Last Year: Never true  Transportation Needs: No Transportation Needs   Lack of Transportation (Medical): No   Lack of Transportation (Non-Medical): No  Physical Activity: Insufficiently Active   Days of Exercise per Week: 1 day   Minutes of Exercise per Session: 30 min  Stress: No Stress Concern Present   Feeling of Stress : Not at all  Social Connections: Not on file    Socially, she was born a [place] and moved to [place] 55 years ago.  She is divorced and has 3 children and 4 grandchildren.  There is no tobacco use.  Family History:  The patient's family history includes Arthritis in her daughter; Colitis in her mother; Colon cancer in her paternal grandmother; Heart attack in her brother, brother, and brother; Heart disease in her daughter; Hyperlipidemia in her brother, brother, brother, and sister; Hypertension in her father; Irritable bowel syndrome in her mother; Leukemia in her  daughter; Stomach cancer (age of onset: 45) in her paternal grandmother.   Her father was a physician and died at age 51.  Her  mother unfortunately died at age 85 and the volcanic eruption in [place] in 1985.  She has 2 brothers 1 is deceased who was a Psychologist, sport and exercise and another brother is alive and has sleep apnea.  She has 1 sister.  ROS General: Negative; No fevers, chills, or night sweats;  HEENT: Negative; No changes in vision or hearing, sinus congestion, difficulty swallowing Pulmonary: Negative; No cough, wheezing, shortness of breath, hemoptysis Cardiovascular: Negative; No chest pain, presyncope, syncope, palpitations GI: Occasional GI discomfort and abdominal pain for which he sees Dr. Havery Moros GU: Negative; No dysuria, hematuria, or difficulty voiding Musculoskeletal: Negative; no myalgias, joint pain, or weakness Hematologic/Oncology: Negative; no easy bruising, bleeding Endocrine: Negative; no heat/cold intolerance; no diabetes Neuro: Negative; no changes in balance, headaches Skin: Negative; No rashes or skin lesions Psychiatric: Negative; No behavioral problems, depression Sleep: Negative; No snoring, daytime sleepiness, hypersomnolence, bruxism, restless legs, hypnogognic hallucinations, no cataplexy Other comprehensive 14 point system review is negative.   PHYSICAL EXAM:   VS:  BP 126/62 (BP Location: Left Arm, Patient Position: Sitting, Cuff Size: Normal)   Pulse (!) 52   Ht $R'4\' 11"'Jn$  (1.499 m)   Wt 117 lb (53.1 kg)   BMI 23.63 kg/m     Repeat blood pressure by me was 122/64  Wt Readings from Last 3 Encounters:  04/15/21 117 lb (53.1 kg)  04/15/21 117 lb (53.1 kg)  03/30/21 119 lb (54 kg)    General: Alert, oriented, no distress.  Skin: normal turgor, no rashes, warm and dry HEENT: Normocephalic, atraumatic. Pupils equal round and reactive to light; sclera anicteric; extraocular muscles intact; Fundi ** Nose without nasal septal hypertrophy Mouth/Parynx benign; Mallinpatti scale 2/3 Neck: No JVD, no carotid bruits; normal carotid upstroke Lungs: clear to ausculatation and percussion; no  wheezing or rales Chest wall: without tenderness to palpitation Heart: PMI not displaced, RRR, s1 s2 normal, 1/6 systolic murmur, no diastolic murmur, no rubs, gallops, thrills, or heaves Abdomen: soft, nontender; no hepatosplenomehaly, BS+; abdominal aorta nontender and not dilated by palpation. Back: no CVA tenderness Pulses 2+ Musculoskeletal: full range of motion, normal strength, no joint deformities Extremities: no clubbing cyanosis or edema, Homan's sign negative  Neurologic: grossly nonfocal; Cranial nerves grossly wnl Psychologic: Normal mood and affect   Studies/Labs Reviewed:   April 15, 2021 ECG (independently read by me): Sinus bradycardia at 52  Recent Labs: BMP Latest Ref Rng & Units 02/09/2021 01/30/2021 01/27/2021  Glucose 70 - 99 mg/dL 92 93 131(H)  BUN 6 - 23 mg/dL 34(H) 41(H) 34(H)  Creatinine 0.40 - 1.20 mg/dL 1.30(H) 1.26(H) 1.44(H)  BUN/Creat Ratio 6 - 22 (calc) - - -  Sodium 135 - 145 mEq/L 140 138 137  Potassium 3.5 - 5.1 mEq/L 4.3 4.2 4.3  Chloride 96 - 112 mEq/L 102 105 104  CO2 19 - 32 mEq/L $Remove'29 24 24  'ZBVMQFo$ Calcium 8.4 - 10.5 mg/dL 9.3 9.3 8.6     Hepatic Function Latest Ref Rng & Units 02/09/2021 01/27/2021 12/23/2020  Total Protein 6.0 - 8.3 g/dL 7.2 7.0 7.2  Albumin 3.5 - 5.2 g/dL 4.3 3.9 4.0  AST 0 - 37 U/L $Remo'23 20 24  'iuwYl$ ALT 0 - 35 U/L $Remo'15 13 17  'rgboE$ Alk Phosphatase 39 - 117 U/L 67 64 60  Total Bilirubin 0.2 - 1.2 mg/dL 0.4 0.5 0.4  Bilirubin, Direct 0.0 -  0.2 mg/dL - - -    CBC Latest Ref Rng & Units 02/09/2021 01/27/2021 12/23/2020  WBC 4.0 - 10.5 K/uL 5.2 4.1 5.3  Hemoglobin 12.0 - 15.0 g/dL 11.3(L) 11.2(L) 11.3(L)  Hematocrit 36.0 - 46.0 % 34.2(L) 33.5(L) 34.0(L)  Platelets 150.0 - 400.0 K/uL 224.0 229.0 239   Lab Results  Component Value Date   MCV 87.9 02/09/2021   MCV 88.5 01/27/2021   MCV 89.7 12/23/2020   Lab Results  Component Value Date   TSH 2.69 02/09/2021   Lab Results  Component Value Date   HGBA1C 5.8 05/29/2020     BNP    Component  Value Date/Time   BNP 190.5 (H) 12/23/2020 1118    ProBNP    Component Value Date/Time   PROBNP 62.0 02/26/2019 1351     Lipid Panel     Component Value Date/Time   CHOL 262 (H) 02/09/2021 1548   TRIG 139.0 02/09/2021 1548   HDL 72.00 02/09/2021 1548   CHOLHDL 4 02/09/2021 1548   VLDL 27.8 02/09/2021 1548   LDLCALC 162 (H) 02/09/2021 1548   LDLCALC 79 02/01/2020 1020     RADIOLOGY: No results found.   Additional studies/ records that were reviewed today include:   04/15/2020 CLINICAL INFORMATION Sleep Study Type: HST   Indication for sleep study: snoring, nocturnal hypoxemia   Epworth Sleepiness Score: 6   SLEEP STUDY TECHNIQUE A multi-channel overnight portable sleep study was performed. The channels recorded were: nasal airflow, thoracic respiratory movement, and oxygen saturation with a pulse oximetry. Snoring was also monitored.   MEDICATIONS albuterol (VENTOLIN HFA) 108 (90 Base) MCG/ACT inhaler ASPIRIN 81 PO atorvastatin (LIPITOR) 20 MG tablet citalopram (CELEXA) 20 MG tablet dicyclomine (BENTYL) 10 MG capsule fluticasone (FLONASE) 50 MCG/ACT nasal spray furosemide (LASIX) 40 MG tablet levothyroxine (EUTHYROX) 50 MCG tablet lisinopril (ZESTRIL) 5 MG tablet metoprolol succinate (TOPROL-XL) 25 MG 24 hr tablet omega-3 acid ethyl esters (LOVAZA) 1 g capsule omeprazole (PRILOSEC) 40 MG capsule ondansetron (ZOFRAN) 4 MG tablet potassium chloride (KLOR-CON) 10 MEQ tablet pramipexole (MIRAPEX) 0.125 MG tablet sucralfate (CARAFATE) 1 g tablet Patient self administered medications include: N/A.   SLEEP ARCHITECTURE Patient was studied for 611.2 minutes. The sleep efficiency was 100.0 % and the patient was supine for 0%. The arousal index was 0.0 per hour.   RESPIRATORY PARAMETERS The overall AHI was 18.1 per hour, with a central apnea index of 0.0 per hour with absent supine sleep.   The oxygen nadir was 76% during sleep. Time spent < 89% was 341.3  minutes.   CARDIAC DATA Mean heart rate during sleep was 52.7 bpm.   IMPRESSIONS - Moderate obstructive sleep apnea (AHI 18.1/h). However, the overall severity may be underestimated since supine sleep was absent on this study and the severity during REM sleep cannot be assessed on this home study. - No significant central sleep apnea occurred during this study (CAI 0.0/h). - Severe oxygen desaturation to a nadir of 76%. - Patient snored 0.2% during the sleep.   DIAGNOSIS - Obstructive Sleep Apnea (G47.33) - Nocturnal Hypoxemia (G47.36)   RECOMMENDATIONS - In this patient with cardiovascular co-morbidities recommend an in-lab CPAP titration study to further assess her sleep disordered breathing and nocturnal hypoxemia.  - Effort should be made to opimize nasal and oropharyngeal patency. - Avoid alcohol, sedatives and other CNS depressants that may worsen sleep apnea and disrupt normal sleep architecture. - Sleep hygiene should be reviewed to assess factors that may improve sleep quality. - Weight  management and regular exercise should be initiated or continued. - Recommend a download and sleep clinic evaluation after initiation of CPAP therapy.    -----------------------------------------------------------------------------------------------------------------------  05/28/2020 CLINICAL INFORMATION The patient is referred for a CPAP titration to treat sleep apnea.   Date of HST: 04/15/2020: AHI 18.1/h; O 2 nadir 76%.   SLEEP STUDY TECHNIQUE As per the AASM Manual for the Scoring of Sleep and Associated Events v2.3 (April 2016) with a hypopnea requiring 4% desaturations.   The channels recorded and monitored were frontal, central and occipital EEG, electrooculogram (EOG), submentalis EMG (chin), nasal and oral airflow, thoracic and abdominal wall motion, anterior tibialis EMG, snore microphone, electrocardiogram, and pulse oximetry. Continuous positive airway pressure (CPAP) was  initiated at the beginning of the study and titrated to treat sleep-disordered breathing.   MEDICATIONS albuterol (VENTOLIN HFA) 108 (90 Base) MCG/ACT inhaler ASPIRIN 81 PO atorvastatin (LIPITOR) 20 MG tablet citalopram (CELEXA) 20 MG tablet dicyclomine (BENTYL) 10 MG capsule erythromycin ophthalmic ointment fluticasone (FLONASE) 50 MCG/ACT nasal spray furosemide (LASIX) 40 MG tablet leflunomide (ARAVA) 20 MG tablet levothyroxine (EUTHYROX) 50 MCG tablet lisinopril (ZESTRIL) 5 MG tablet meloxicam (MOBIC) 15 MG tablet metoprolol succinate (TOPROL-XL) 25 MG 24 hr tablet omega-3 acid ethyl esters (LOVAZA) 1 g capsule omeprazole (PRILOSEC) 40 MG capsule ondansetron (ZOFRAN) 4 MG tablet potassium chloride (KLOR-CON) 10 MEQ tablet pramipexole (MIRAPEX) 0.125 MG tablet sucralfate (CARAFATE) 1 g tablet   Medications self-administered by patient taken the night of the study : N/A   TECHNICIAN COMMENTS Comments added by technician: Patient was adherent to CPAP pressure and mask. Comments added by scorer: N/A   RESPIRATORY PARAMETERS Optimal PAP Pressure (cm):  9          AHI at Optimal Pressure (/hr):            N/A Overall Minimal O2 (%):         84.00   Supine % at Optimal Pressure (%):    N/A Minimal O2 at Optimal Pressure (%): 84.00      SLEEP ARCHITECTURE The study was initiated at 10:05:37 PM and ended at 5:12:48 AM.   Sleep onset time was 45.4 minutes and the sleep efficiency was 58.1%. The total sleep time was 248 minutes.   The patient spent 16.53% of the night in stage N1 sleep, 83.27% in stage N2 sleep, 0.20% in stage N3 and 0% in REM.Stage REM latency was N/A minutes   Wake after sleep onset was 133.8. Alpha intrusion was absent. Supine sleep was 98.79%.   CARDIAC DATA The 2 lead EKG demonstrated sinus rhythm. The mean heart rate was 80.25 beats per minute. Other EKG findings include: None.   LEG MOVEMENT DATA The total Periodic Limb Movements of Sleep (PLMS) were 0.  The PLMS index was 0.00. A PLMS index of <15 is considered normal in adults.   IMPRESSIONS - CPAP was initated at 6 cm and was titrated to 9 cm of water. AHI at 9 cm was 4.1 and O2 nadir 86%. - Central sleep apnea was not noted during this titration (CAI = 1.2/h). - Moderate oxygen desaturations to a nadir of 84% at 7 cm of water. - No snoring was audible during this study. - No cardiac abnormalities were observed during this study. - Clinically significant periodic limb movements were not noted during this study. Arousals associated with PLMs were rare.   DIAGNOSIS - Obstructive Sleep Apnea (G47.33)   RECOMMENDATIONS - Recommend an initial trial of CPAP Auto therapy at 8 -  14 cm H2O with heated humidification. A Small size Philips Respironics Nasal Mask DreamWear mask was used for the titration.  - Effort should be made to optimize nasal and oropharyngeal patency. - Avoid alcohol, sedatives and other CNS depressants that may worsen sleep apnea and disrupt normal sleep architecture. - Sleep hygiene should be reviewed to assess factors that may improve sleep quality. - Weight management and regular exercise should be initiated or continued. - Recommend a download in 30 days and sleep clinicevaluation after 4 weeks of therapy.    ASSESSMENT:    1. OSA (obstructive sleep apnea)   2. Nocturnal hypoxia   3. Snoring   4. Coronary artery disease involving native coronary artery of native heart without angina pectoris   5. Essential hypertension   6. Hypothyroidism, unspecified type   7. Gastroesophageal reflux disease without esophagitis     PLAN:  [name] is a very pleasant 80 year old female who has a history of hypertension, nonobstructive CAD, hypothyroidism, and due to complaints of snoring, fatigability, the crease concentration she ultimately was referred to undergo a sleep study.  On her home study she did not have any supine sleep but was found to have at least moderate  overall sleep apnea with an AHI of 18.1/h.  She had significant oxygen desaturation to nadir of 76% raising concern that she may very well have more severe sleep apnea during REM sleep.  She ultimately received a new ResMed air sense 11 CPAP auto unit on January 19, 2021.  Initially she felt well with therapy and had met compliance standards.  However, her initial mask she thought may have been too big and subsequently received a ResMed AirFit N 30i mask.  Apparently she does not seem to be able to use this mask as result of significant difficulty with a essentially stopped using treatment since March 15, 2021.  I had a lengthy discussion with her today in the office.  I reviewed both her home study as well as her CPAP titration study.  I discussed normal sleep and the adverse consequences of untreated sleep apnea leading to increased arousals with disruptive sleep architecture.  I discussed its ramifications with reference to blood pressure control, development of nocturnal palpitations with potential increased risk for atrial fibrillation, potential for nocturnal hypoxemia contributing to nocturnal ischemia both cardiac as well as cerebrovascular Lee, and I discussed its effects on inflammation, glucose control and GERD.  She has noticed that her GERD did improve with initiation of CPAP therapy.  We also discussed its effects on frequent nocturia which typically improves with CPAP therapy.  I have asked Barry Brunner, our sleep coordinator to come and see if there was a potential sample mask we could provide her that would fit better than her current mask and hopefully allow for optimal sleep PAP compliance.  I discussed optimal sleep duration for an adult should be 7 to 9 hours.  Based on her current settings, I have recommended adjustment to her current settings and will change her settings to 8-16 pressure range since she was already achieving 13.7 cm of water on her most recent download.  With CPAP therapy  she was unaware of any snoring.  An Epworth scale was calculated in the office today and this endorsed at 0.  We will obtain a download after 1 month of her new mask trial and hopefully compliance will be markedly improved.  She has a history of palpitations and her blood pressure today is stable on metoprolol  succinate 25 mg daily.  Apparently she has a prescription for lisinopril 10 mg but does not take this daily.  She is on levothyroxine 50 mcg for hypothyroidism and takes omeprazole for GERD.   Medication Adjustments/Labs and Tests Ordered: Current medicines are reviewed at length with the patient today.  Concerns regarding medicines are outlined above.  Medication changes, Labs and Tests ordered today are listed in the Patient Instructions below. Patient Instructions  Medication Instructions:  Your Physician recommend you continue on your current medication as directed.    *If you need a refill on your cardiac medications before your next appointment, please call your pharmacy*   Lab Work: None   Testing/Procedures: None.   Follow-Up: At [facility], you and your health needs are our priority.  As part of our continuing mission to provide you with exceptional heart care, we have created designated Provider Care Teams.  These Care Teams include your primary Cardiologist (physician) and Advanced Practice Providers (APPs -  Physician Assistants and Nurse Practitioners) who all work together to provide you with the care you need, when you need it.  We recommend signing up for the patient portal called "MyChart".  Sign up information is provided on this After Visit Summary.  MyChart is used to connect with patients for Virtual Visits (Telemedicine).  Patients are able to view lab/test results, encounter notes, upcoming appointments, etc.  Non-urgent messages can be sent to your provider as well.   To learn more about what you can do with MyChart, go to [email or web address].    Your  next appointment:   12 month(s)  The format for your next appointment:   In Person  Provider:   Dr. Corky Downs, MD      Signed, Shelva Majestic, MD  04/22/2021 5:56 PM    [facility] [address], [place], [place], [place]  [address] Phone: [phone]

## 2021-04-15 NOTE — Patient Instructions (Addendum)
Rest, fluids , tylenol  For cough:  Take Mucinex DM twice a day as needed until better  For nasal congestion: -Use OTC  Flonase or Nasacort (generics are okay and less expensive): 2 nasal sprays on each side of the nose in the morning until you feel better    Avoid decongestants such as  Pseudoephedrine or phenylephrine     Call if not gradually better over the next  7 days   Call anytime if the symptoms are severe, you have high fever, short of breath, chest pain   Proceed with a COVID booster once you are better

## 2021-04-15 NOTE — Progress Notes (Signed)
Subjective:    Patient ID: Julie Wilkerson, female    DOB: 01-17-1941, 80 y.o.   MRN: [id number]  DOS:  04/15/2021 Type of visit - description: Acute  Symptoms a started 5 days ago: Headache, sore throat, aches, chills. She also had fatigue and poor appetite. Symptoms decreased with Tylenol.  Had a negative COVID test. Overall today she feels somewhat better.  Denies fever per se No nausea vomiting No rash Has some sinus congestion with clear discharge. Mild cough at night No chest pain or difficulty breathing.   Review of Systems See above   Past Medical History:  Diagnosis Date   Abdominal pain 11/07/2016   Accelerating angina ([place]) 02/54/2706   Acute diastolic CHF (congestive heart failure) ([place]) 02/16/2019   Acute on chronic diastolic CHF (congestive heart failure) ([place]) 02/23/2019   Acute pansinusitis 04/26/2017   Amblyopia of eye, left 04/19/2018   Anemia    Anginal pain ([facility])    Anxiety    Arthritis    Asthma    Atypical chest pain 02/04/2019   Bilateral carotid bruits 03/28/2017   Blood transfusion without reported diagnosis    Breast cancer ([place])    2002, left, encapsulated, microcalcifications. lumpectomy, radtiation x 30   Breast cancer in female [facility])    Bursitis of left hip 09/04/2018   Change in bowel habits 02/22/2018   Change in mole 06/08/2016   Chest pain 03/04/2020   CHF (congestive heart failure) (HCC)    Chronic bilateral thoracic back pain 05/23/2016   Chronic diastolic congestive heart failure ([place]) 05/28/2019   Colitis    Complication of anesthesia    VERY SENSITIVE TO ANESTHESIA    Constipation 01/17/2018   Coronary artery disease involving native coronary artery of native heart without angina pectoris 05/28/2019   Cortical age-related cataract of right eye 04/18/2018   Cough variant asthma  vs UACS from ACEi     Spirometry 12/02/2016  FEV1 1.26 (76%)  Ratio 78 with min curvature p last saba > 6 h prior  - trial off acei 12/02/2016   - 12/02/2016  After extensive coaching HFA effectiveness =    75% with qvar autohaler > rechallenge with 80 2bid     Decreased visual acuity 01/16/2017   Degeneration of lumbar intervertebral disc 08/01/2018   Diarrhea 11/16/2015   D/o Diverticuli, polyps, celiac disease.     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   Left hand pain 05/07/2018   Left hip pain 03/01/2017   Left leg pain 03/01/2017   Left shoulder pain 05/07/2018   Low back pain 08/10/2015   LUQ pain 06/01/2019   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   Neck pain 03/01/2017   Nuclear sclerotic cataract of right eye 04/18/2018   Osteoporosis    Overweight (BMI 25.0-29.9) 02/14/2019   Pain in joint of left shoulder 08/11/2018   Paroxysmal  atrial tachycardia ([place]) 05/28/2019   Pelvic pain 07/02/2018   Personal history of radiation therapy    Pneumonia    PONV (postoperative nausea and vomiting)    Poor appetite 02/22/2018   Pseudophakia of left eye 04/18/2018   PVD (posterior vitreous detachment), right 04/24/2019   Restless sleeper 11/07/2016   Right hip pain 10/23/2017   RLQ discomfort 03/01/2017   RLS (restless legs syndrome) 05/07/2019   S/P left THA, AA 02/13/2019   Sepsis due to pneumonia ([place]) 02/16/2019   Sleep apnea    uses CPAP   Stomach cramps    Tremor 02/15/2016   Urinary frequency 12/22/2017   Urinary incontinence  05/07/2019   Urinary tract infection 11/04/2016   Vitamin D deficiency 04/29/2016    Past Surgical History:  Procedure Laterality Date   ABDOMINAL HYSTERECTOMY     partial   APPENDECTOMY     BREAST LUMPECTOMY Left 2002   CHOLECYSTECTOMY  Age 66 or 61   COLONOSCOPY  2017   EYE SURGERY Left    cataract   LEFT HEART CATH AND CORONARY ANGIOGRAPHY N/A 02/09/2019   Procedure: LEFT HEART CATH AND CORONARY ANGIOGRAPHY;  Surgeon: Nelva Bush, MD;  Location: [facility];  Service: Cardiovascular;  Laterality: N/A;   TONSILLECTOMY     TOTAL HIP ARTHROPLASTY Left 02/13/2019   Procedure: TOTAL HIP ARTHROPLASTY ANTERIOR APPROACH;  Surgeon: Paralee Cancel, MD;  Location: [facility];  Service: Orthopedics;  Laterality: Left;  70 mins   UPPER GASTROINTESTINAL ENDOSCOPY      Allergies as of 04/15/2021       Reactions   Albuterol And Levalbuterol Palpitations   Episode of palpitations, tremor and anxiety when administered levalbuterol for PFT. Recommend avoiding this medicaiton class        Medication List        Accurate as of April 15, 2021 11:59 PM. If you have any questions, ask your nurse or doctor.          ASPIRIN 81 PO 81 mg daily.   citalopram 20 MG tablet Commonly known as: CELEXA TAKE 1 TABLET BY MOUTH AT BEDTIME   erythromycin ophthalmic ointment SMARTSIG:In Eye(s)   fluticasone 110 MCG/ACT inhaler Commonly known as: FLOVENT HFA Inhale 1-2 puffs into the lungs 2 (two) times daily as needed.   hyoscyamine 0.125 MG SL tablet Commonly known as: LEVSIN SL Place 1 tablet (0.125 mg total) under the tongue 2 (two) times daily.   levothyroxine 50 MCG tablet Commonly known as: SYNTHROID TAKE 1 TABLET BY MOUTH ONCE DAILY BEFORE BREAKFAST   lisinopril 10 MG tablet Commonly known as: ZESTRIL Take 1 tablet by mouth twice daily as needed   metoprolol succinate 25 MG 24 hr tablet Commonly known as: TOPROL-XL TAKE 1 TABLET BY MOUTH AT BEDTIME   NON  FORMULARY as needed (wheezing, SOB). Iprasynt inhaler (unable to find english equivalent med name)   omega-3 acid ethyl esters 1 g capsule Commonly known as: LOVAZA Take 2 g by mouth daily.   omeprazole 40 MG capsule Commonly known as: PRILOSEC Take 1 capsule (40 mg total) by mouth in the morning and at bedtime. Take 1 tablet by mouth once to twice daily   ondansetron 4 MG tablet Commonly known as: Zofran Dissolve 1 tablet under tongue once to twice a day, every day   potassium chloride 10 MEQ tablet Commonly known as: KLOR-CON Take 1 tablet by mouth once daily   sucralfate 1 g tablet Commonly known as: Carafate Take 1  tablet (1 g total) by mouth 2 (two) times daily.           Objective:   Physical Exam BP 122/76 (BP Location: Left Arm, Patient Position: Sitting, Cuff Size: Small)   Pulse 76   Temp 97.9 F (36.6 C) (Oral)   Resp 16   Ht 4\' 10"  (1.473 m)   Wt 117 lb (53.1 kg)   SpO2 96%   BMI 24.45 kg/m  General:   Well developed, NAD, BMI noted. HEENT:  Normocephalic . Face symmetric, atraumatic. TMs obscured by wax. Nose slightly congested Throat: Symmetric, no red Lungs:  CTA B.  No crackles or wheezing. Normal respiratory effort, no intercostal retractions, no accessory muscle use. Heart: RRR,  no murmur.  Lower extremities: no pretibial edema bilaterally  Skin: Not pale. Not jaundice Neurologic:  alert & oriented X3.  Speech normal, gait appropriate for age and unassisted Psych--  Cognition and judgment appear intact.  Cooperative with normal attention span and concentration.  Behavior appropriate. No anxious or depressed appearing.      Assessment     80 year old female, PMH includes hypertension, history of breast cancer, thyroid disease, CHF, CAD, presents with:  Viral syndrome: Symptoms started 5 days ago, she has a history of 3 previous COVID-vaccine and a flu shot 2 weeks ago. Home COVID test negative, flu test today here: Negative. On  exam she is nontoxic, plan: Rest, fluids, Robitussin, Flonase. Red flag symptoms that indicate need to ER visit discussed with the patient. Proceed with a COVID booster once she feels better  This visit occurred during the SARS-CoV-2 public health emergency.  Safety protocols were in place, including screening questions prior to the visit, additional usage of staff PPE, and extensive cleaning of exam room while observing appropriate contact time as indicated for disinfecting solutions.

## 2021-04-21 DIAGNOSIS — G4733 Obstructive sleep apnea (adult) (pediatric): Secondary | ICD-10-CM | POA: Diagnosis not present

## 2021-04-22 ENCOUNTER — Encounter: Payer: Self-pay | Admitting: Cardiovascular Disease

## 2021-04-24 ENCOUNTER — Ambulatory Visit: Payer: Medicare HMO | Admitting: Cardiovascular Disease

## 2021-05-05 ENCOUNTER — Other Ambulatory Visit: Payer: Self-pay | Admitting: Family Medicine

## 2021-05-05 DIAGNOSIS — M5412 Radiculopathy, cervical region: Secondary | ICD-10-CM | POA: Diagnosis not present

## 2021-05-05 DIAGNOSIS — M546 Pain in thoracic spine: Secondary | ICD-10-CM | POA: Diagnosis not present

## 2021-05-05 DIAGNOSIS — M5030 Other cervical disc degeneration, unspecified cervical region: Secondary | ICD-10-CM | POA: Diagnosis not present

## 2021-05-06 DIAGNOSIS — H43811 Vitreous degeneration, right eye: Secondary | ICD-10-CM | POA: Diagnosis not present

## 2021-05-06 DIAGNOSIS — H5203 Hypermetropia, bilateral: Secondary | ICD-10-CM | POA: Diagnosis not present

## 2021-05-06 DIAGNOSIS — H35411 Lattice degeneration of retina, right eye: Secondary | ICD-10-CM | POA: Diagnosis not present

## 2021-05-06 DIAGNOSIS — H524 Presbyopia: Secondary | ICD-10-CM | POA: Diagnosis not present

## 2021-05-06 DIAGNOSIS — H35372 Puckering of macula, left eye: Secondary | ICD-10-CM | POA: Diagnosis not present

## 2021-05-06 DIAGNOSIS — H2511 Age-related nuclear cataract, right eye: Secondary | ICD-10-CM | POA: Diagnosis not present

## 2021-05-06 DIAGNOSIS — H53002 Unspecified amblyopia, left eye: Secondary | ICD-10-CM | POA: Diagnosis not present

## 2021-05-06 DIAGNOSIS — H16223 Keratoconjunctivitis sicca, not specified as Sjogren's, bilateral: Secondary | ICD-10-CM | POA: Diagnosis not present

## 2021-05-06 DIAGNOSIS — H25011 Cortical age-related cataract, right eye: Secondary | ICD-10-CM | POA: Diagnosis not present

## 2021-05-06 DIAGNOSIS — H52203 Unspecified astigmatism, bilateral: Secondary | ICD-10-CM | POA: Diagnosis not present

## 2021-05-21 DIAGNOSIS — G4733 Obstructive sleep apnea (adult) (pediatric): Secondary | ICD-10-CM | POA: Diagnosis not present

## 2021-05-27 ENCOUNTER — Other Ambulatory Visit: Payer: Self-pay | Admitting: Family Medicine

## 2021-05-27 DIAGNOSIS — Z1231 Encounter for screening mammogram for malignant neoplasm of breast: Secondary | ICD-10-CM

## 2021-05-29 ENCOUNTER — Ambulatory Visit: Payer: Medicare HMO | Admitting: Internal Medicine

## 2021-06-01 ENCOUNTER — Ambulatory Visit: Payer: Medicare HMO | Admitting: Medical

## 2021-06-03 ENCOUNTER — Ambulatory Visit (INDEPENDENT_AMBULATORY_CARE_PROVIDER_SITE_OTHER): Payer: Medicare HMO | Admitting: Medical

## 2021-06-03 ENCOUNTER — Ambulatory Visit (HOSPITAL_BASED_OUTPATIENT_CLINIC_OR_DEPARTMENT_OTHER)
Admission: RE | Admit: 2021-06-03 | Discharge: 2021-06-03 | Disposition: A | Discharge status: Home / Self Care | Payer: Medicare HMO | Source: Ambulatory Visit | Attending: Medical | Admitting: Medical

## 2021-06-03 ENCOUNTER — Other Ambulatory Visit: Payer: Self-pay

## 2021-06-03 VITALS — BP 120/60 | HR 66 | Resp 18 | Ht <= 58 in | Wt 116.0 lb

## 2021-06-03 DIAGNOSIS — R0781 Pleurodynia: Secondary | ICD-10-CM | POA: Insufficient documentation

## 2021-06-03 DIAGNOSIS — R1084 Generalized abdominal pain: Secondary | ICD-10-CM

## 2021-06-03 DIAGNOSIS — K588 Other irritable bowel syndrome: Secondary | ICD-10-CM | POA: Diagnosis not present

## 2021-06-03 DIAGNOSIS — M546 Pain in thoracic spine: Secondary | ICD-10-CM

## 2021-06-03 DIAGNOSIS — Z1231 Encounter for screening mammogram for malignant neoplasm of breast: Secondary | ICD-10-CM | POA: Diagnosis not present

## 2021-06-03 DIAGNOSIS — I509 Heart failure, unspecified: Secondary | ICD-10-CM

## 2021-06-03 DIAGNOSIS — J984 Other disorders of lung: Secondary | ICD-10-CM | POA: Diagnosis not present

## 2021-06-03 DIAGNOSIS — K449 Diaphragmatic hernia without obstruction or gangrene: Secondary | ICD-10-CM

## 2021-06-03 DIAGNOSIS — G8929 Other chronic pain: Secondary | ICD-10-CM

## 2021-06-03 DIAGNOSIS — K219 Gastro-esophageal reflux disease without esophagitis: Secondary | ICD-10-CM

## 2021-06-03 DIAGNOSIS — R0600 Dyspnea, unspecified: Secondary | ICD-10-CM | POA: Diagnosis not present

## 2021-06-03 LAB — COMPREHENSIVE METABOLIC PANEL
ALT: 13 U/L (ref 0–35)
AST: 21 U/L (ref 0–37)
Albumin: 4.2 g/dL (ref 3.5–5.2)
Alkaline Phosphatase: 69 U/L (ref 39–117)
BUN: 31 mg/dL — ABNORMAL HIGH (ref 6–23)
CO2: 26 mEq/L (ref 19–32)
Calcium: 8.8 mg/dL (ref 8.4–10.5)
Chloride: 105 mEq/L (ref 96–112)
Creatinine, Ser: 0.98 mg/dL (ref 0.40–1.20)
GFR: 54.89 mL/min — ABNORMAL LOW (ref >60.00)
Glucose, Bld: 85 mg/dL (ref 70–99)
Potassium: 4.8 mEq/L (ref 3.5–5.1)
Sodium: 137 mEq/L (ref 135–145)
Total Bilirubin: 0.6 mg/dL (ref 0.2–1.2)
Total Protein: 6.8 g/dL (ref 6.0–8.3)

## 2021-06-03 LAB — CBC WITH DIFFERENTIAL/PLATELET
Basophils Absolute: 0 10*3/uL (ref 0.0–0.1)
Basophils Relative: 0.2 % (ref 0.0–3.0)
Eosinophils Absolute: 0 10*3/uL (ref 0.0–0.7)
Eosinophils Relative: 0 % (ref 0.0–5.0)
HCT: 34.7 % — ABNORMAL LOW (ref 36.0–46.0)
Hemoglobin: 11.2 g/dL — ABNORMAL LOW (ref 12.0–15.0)
Lymphocytes Relative: 35.7 % (ref 12.0–46.0)
Lymphs Abs: 1.8 10*3/uL (ref 0.7–4.0)
MCHC: 32.2 g/dL (ref 30.0–36.0)
MCV: 88.1 fl (ref 78.0–100.0)
Monocytes Absolute: 0.6 10*3/uL (ref 0.1–1.0)
Monocytes Relative: 12.2 % — ABNORMAL HIGH (ref 3.0–12.0)
Neutro Abs: 2.6 10*3/uL (ref 1.4–7.7)
Neutrophils Relative %: 51.9 % (ref 43.0–77.0)
Platelets: 199 10*3/uL (ref 150.0–400.0)
RBC: 3.94 Mil/uL (ref 3.87–5.11)
RDW: 15.3 % (ref 11.5–15.5)
WBC: 4.9 10*3/uL (ref 4.0–10.5)

## 2021-06-03 LAB — LIPASE: Lipase: 23 U/L (ref 11.0–59.0)

## 2021-06-03 LAB — BRAIN NATRIURETIC PEPTIDE: Pro B Natriuretic peptide (BNP): 63 pg/mL (ref 0.0–100.0)

## 2021-06-03 MED ORDER — HYOSCYAMINE SULFATE 0.125 MG SL SUBL: 0.1250 mg | SUBLINGUAL_TABLET | Freq: Two times a day (BID) | SUBLINGUAL | 1 refills | Status: DC

## 2021-06-03 NOTE — Patient Instructions (Addendum)
Chronic abdomen pain.  More of umbilicus wheezing presently.  History of hiatal hernia and suspect GERD.  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  Recommend that you stay on omeprazole, IB guard and refilling your Levsin which you only used for 2 months after last GI visit.  Recommend using Levsin on days that you have diarrhea.  If you get constipated would recommend hydrating well and using that such as Colace over-the-counter.  We will get CBC, CMP, lipase and place order for CT abdomen and pelvis.  You do have known history of diverticulosis and we will see if any diverticulitis might be present.  CT abdomen pelvis will need to be prior authorized.  Patient referral back to your GI MD.   CHF on chart review.  No pedal edema on exam.  Clinically does not appear to be in failure.  You had questions about whether or not you should be on furosemide.  Will get chest x-ray today and BNP.  Also follow your kidney function as this will play a role in deciding potential dosing and frequency of diuretic.  Kidney function has been low in the past.  Chronic left lower rib pain since early 2000's.  We will get left rib x-ray/series today.   For chronic back pain continue to follow-up with your orthopedist.  No angina symptoms presently.  But do have history of intermittent complaints in the past.  Continue aspirin and statin.  Very active recurrent chest pain be seen in the ED.  Follow-up date to be determined based on imaging and lab studies.  But would also asked that you go ahead and schedule follow-up with your PCP.

## 2021-06-03 NOTE — Progress Notes (Signed)
Subjective:    Patient ID: Julie Wilkerson, female    DOB: 10/18/1940, 80 y.o.   MRN: [id number]  HPI  Pt in with some persistent abdomen pain. She points to lower abdomen and some pain in back as well.   Pt also has questions mammogram. Pt has order for screening mammogram. Pt does have history of breast cancer on left side years ago. Pt has left lower rib pain when she bends over.   Pt had pain in [place] back in the summer.  My plan on that day was.   "Hx of gerd and on omeprazole twice daily. Add famotadine and carafate. Eat healthy.  Refer back to GI MD to get opinion if need endoscopy?   For llq pain on exam and diarrhea will rx cipro and flagyl. Possible diverticulitis.    Hx of ibs. Continue hycosamine."   Pt did eventually see GI MD.  "Plan: - upper endoscopy - discussed empiric Reglan but she wishes to hold off. Not sure if cameron lesions / worsening hernia in light of symptoms - MRI back on Sunday - BMET today - adjust lasix PRN - Levsin scheduled BID - trial of IB gard samples as well - continue PPI for now - trial of 0.125% topical nitroglycerin ointment empirically"   Jolly Mango, MD [facility]  Pt is telling me her pain is about the same as before. She does not her stool pattern varies. Some days diarrhea and some times constipated. Most recently having mild loose stools.  Pt tells me IB guard did help. She used levsin briefly but only for 2 months.    Pt used to be on furosemide. She tried to get refill and pharmacy and did not refill. On review chf on prior echo report. Rare occaional swelling of legs. Some occasional dyspnea on exertion.   On recent visit with pcp her comments in AVS. "She has also been receiving conflicting reports about using Lasix because at her last blood work, her kidney functions were not good. She is still taking 40 mg Lasix PO daily."  Pt has back pain. She states various disk issues and scoliosis. I  don't see mri of her spine. Pt got injection in her lower back. Her back pain improved. It did not impact abdomen pain.    Pt also reports chronic left lower rib pain since 2002. After breast surgery.       Review of Systems  Constitutional:  Negative for chills, fatigue and fever.  HENT:  Negative for congestion, drooling, ear discharge and ear pain.   Respiratory:  Negative for cough, chest tightness and wheezing.   Cardiovascular:  Negative for chest pain and palpitations.  Gastrointestinal:  Positive for abdominal pain. Negative for blood in stool, diarrhea, nausea and rectal pain.  Genitourinary:  Negative for difficulty urinating, dysuria, flank pain, genital sores and hematuria.  Musculoskeletal:  Positive for back pain. Negative for joint swelling and myalgias.  Skin:  Negative for rash.    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 lumpectomy, radtiation x 30   Breast cancer in female [facility])    Bursitis of left hip 09/04/2018   Change in bowel habits 02/22/2018   Change in mole 06/08/2016   Chest pain 03/04/2020   CHF (congestive heart failure) ([facility])    Chronic bilateral thoracic back pain 05/23/2016   Chronic diastolic congestive heart failure ([place]) 05/28/2019   Colitis    Complication of anesthesia    VERY SENSITIVE TO ANESTHESIA    Constipation 01/17/2018   Coronary artery disease involving native coronary artery of native heart without  angina pectoris 05/28/2019   Cortical age-related cataract of right eye 04/18/2018   Cough variant asthma  vs UACS from ACEi     Spirometry 12/02/2016  FEV1 1.26 (76%)  Ratio 78 with min curvature p last saba > 6 h prior  - trial off acei 12/02/2016  - 12/02/2016  After extensive coaching HFA effectiveness =    75% with qvar autohaler > rechallenge with 80 2bid     Decreased visual acuity 01/16/2017   Degeneration of lumbar intervertebral disc 08/01/2018   Diarrhea 11/16/2015   D/o Diverticuli, polyps, celiac disease.     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   Left hand pain 05/07/2018   Left hip pain 03/01/2017   Left leg pain 03/01/2017   Left shoulder pain 05/07/2018   Low back pain 08/10/2015   LUQ pain 06/01/2019   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   Neck pain 03/01/2017   Nuclear sclerotic cataract of right eye 04/18/2018   Osteoporosis    Overweight (BMI 25.0-29.9) 02/14/2019   Pain in joint of left shoulder 08/11/2018   Paroxysmal atrial tachycardia ([place]) 05/28/2019   Pelvic pain 07/02/2018   Personal history of radiation therapy    Pneumonia    PONV (postoperative nausea and vomiting)    Poor appetite 02/22/2018   Pseudophakia of left eye 04/18/2018   PVD (posterior vitreous detachment), right 04/24/2019   Restless sleeper 11/07/2016   Right hip pain 10/23/2017   RLQ discomfort  03/01/2017   RLS (restless legs syndrome) 05/07/2019   S/P left THA, AA 02/13/2019   Sepsis due to pneumonia ([place]) 02/16/2019   Sleep apnea    uses CPAP   Stomach cramps    Tremor 02/15/2016   Urinary frequency 12/22/2017   Urinary incontinence 05/07/2019   Urinary tract infection 11/04/2016   Vitamin D deficiency 04/29/2016     Social History   Socioeconomic History   Marital status: Single    Spouse name: Not on file   Number of children: 3   Years of education: 18   Highest education level: Master's degree (e.g., MA, MS, MEng, MEd, MSW, MBA)  Occupational History   Occupation: retired    Comment: Pharmacist, hospital, kindergarten  Tobacco Use   Smoking status: Never   Smokeless tobacco: Never  Vaping Use   Vaping Use: Never used  Substance and Sexual Activity   Alcohol use: Never    Alcohol/week: 0.0 standard drinks   Drug use: Never   Sexual activity: Not Currently    Birth control/protection: None    Comment: lives alone, avoids daiiry and gluten. 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  Other Topics Concern   Not on file  Social History Narrative   Not on file   Social Determinants of Health   Financial Resource Strain: Low Risk    Difficulty of Paying Living Expenses: Not hard at all  Food Insecurity: No Food Insecurity   Worried About Charity fundraiser in the Last Year: Never true   Cecilia in the Last Year: Never true  Transportation Needs: No Transportation Needs   Lack of Transportation (Medical): No   Lack of Transportation (Non-Medical): No  Physical Activity: Insufficiently Active   Days of Exercise per Week: 1 day   Minutes of Exercise per Session: 30 min  Stress: No Stress Concern Present   Feeling of Stress : Not at all  Social Connections: Not on file  Intimate Partner Violence: Not At Risk   Fear of Current or Ex-Partner: No   Emotionally Abused: No   Physically Abused: No   Sexually Abused: No    Past Surgical History:  Procedure Laterality Date    ABDOMINAL HYSTERECTOMY     partial   APPENDECTOMY     BREAST LUMPECTOMY Left 2002   CHOLECYSTECTOMY  Age 89 or 55   COLONOSCOPY  2017   EYE SURGERY Left    cataract   LEFT HEART CATH AND CORONARY ANGIOGRAPHY N/A 02/09/2019   Procedure: LEFT HEART CATH AND CORONARY ANGIOGRAPHY;  Surgeon: Nelva Bush, MD;  Location: [facility];  Service: Cardiovascular;  Laterality: N/A;   TONSILLECTOMY     TOTAL HIP ARTHROPLASTY Left 02/13/2019   Procedure: TOTAL HIP ARTHROPLASTY ANTERIOR APPROACH;  Surgeon: Paralee Cancel, MD;  Location: [facility];  Service: Orthopedics;  Laterality: Left;  70 mins   UPPER GASTROINTESTINAL ENDOSCOPY      Family History  Problem Relation Age of Onset   Colitis Mother    Irritable bowel syndrome Mother    Hypertension Father    Hyperlipidemia Sister    Hyperlipidemia Brother    Heart attack Brother    Hyperlipidemia Brother    Heart attack Brother    Hyperlipidemia Brother    Heart attack Brother    Stomach cancer Paternal Grandmother 22   Colon cancer Paternal Grandmother    Leukemia Daughter    Arthritis Daughter    Heart disease Daughter        ASD vs VSD   Esophageal cancer Neg Hx    Pancreatic cancer Neg Hx    Rectal cancer Neg Hx     Allergies  Allergen Reactions   Albuterol And Levalbuterol Palpitations    Episode of palpitations, tremor and anxiety when administered levalbuterol for PFT. Recommend avoiding this medicaiton class    Current Outpatient Medications on File Prior to Visit  Medication Sig Dispense Refill   ASPIRIN 81 PO 81 mg daily.      citalopram (CELEXA) 20 MG tablet TAKE 1 TABLET BY MOUTH AT BEDTIME 90 tablet 0   erythromycin ophthalmic ointment SMARTSIG:In Eye(s)     fluticasone (FLOVENT HFA) 110 MCG/ACT inhaler Inhale 1-2 puffs into the lungs 2 (two) times daily  as needed. 1 each 12   hyoscyamine (LEVSIN SL) 0.125 MG SL tablet Place 1 tablet (0.125 mg total) under the tongue 2 (two) times daily. 30 tablet 1    levothyroxine (SYNTHROID) 50 MCG tablet TAKE 1 TABLET BY MOUTH ONCE DAILY BEFORE BREAKFAST 90 tablet 0   lisinopril (ZESTRIL) 10 MG tablet Take 1 tablet by mouth twice daily as needed 180 tablet 0   metoprolol succinate (TOPROL-XL) 25 MG 24 hr tablet TAKE 1 TABLET BY MOUTH AT BEDTIME 90 tablet 0   NON FORMULARY as needed (wheezing, SOB). Iprasynt inhaler (unable to find english equivalent med name)     omega-3 acid ethyl esters (LOVAZA) 1 g capsule Take 2 g by mouth daily.     omeprazole (PRILOSEC) 40 MG capsule Take 1 capsule (40 mg total) by mouth in the morning and at bedtime. Take 1 tablet by mouth once to twice daily 60 capsule 5   ondansetron (ZOFRAN) 4 MG tablet Dissolve 1 tablet under tongue once to twice a day, every day 90 tablet 3   potassium chloride (KLOR-CON) 10 MEQ tablet Take 1 tablet by mouth once daily 90 tablet 0   sucralfate (CARAFATE) 1 g tablet Take 1 tablet (1 g total) by mouth 2 (two) times daily. 60 tablet 0   No current facility-administered medications on file prior to visit.    BP 120/60    Pulse 66    Resp 18    Ht 4\' 10"  (1.473 m)    Wt 116 lb (52.6 kg)    SpO2 93%    BMI 24.24 kg/m       Objective:   Physical Exam   General Mental Status- Alert. General Appearance- Not in acute distress.   Skin General: Color- Normal Color. Moisture- Normal Moisture.  Neck Carotid Arteries- Normal color. Moisture- Normal Moisture. No carotid bruits. No JVD.  Chest and Lung Exam Auscultation: Breath Sounds:-Normal.  Cardiovascular Auscultation:Rythm- Regular. Murmurs & Other Heart Sounds:Auscultation of the heart reveals- No Murmurs.  Abdomen Inspection:-Inspeection Normal. Palpation/Percussion:Note:No mass. Palpation and Percussion of the abdomen reveal-mild diffuse tenderness, but moderate tender area right of umbilicus. Non Distended + BS, no rebound or guarding.   Neurologic Cranial Nerve exam:- CN III-XII intact(No nystagmus), symmetric smile. Drift  Test:- No drift. Romberg Exam:- Negative.  Heal to Toe Gait exam:-Normal. Finger to Nose:- Normal/Intact Strength:- 5/5 equal and symmetric strength both upper and lower extremities.    Anterior thorax-left lower rib area tenderness to palpation directly.  Lower extremity-symmetric And no pedal edema.  Negative Homans' sign.    Assessment & Plan:   Patient Instructions  Chronic abdomen pain.  More of umbilicus wheezing presently.  History of hiatal hernia and suspect GERD.  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  Recommend that you stay on omeprazole, IB guard and refilling your Levsin which you only used for 2 months after last GI visit.  Recommend using Levsin on days that you have diarrhea.  If you get constipated would recommend hydrating well and using that such as Colace over-the-counter.  We will get CBC, CMP, lipase and place order for CT abdomen and pelvis.  You do have known history of diverticulosis and we will see if any diverticulitis might be present.  CT abdomen pelvis will need to be prior authorized.  Patient referral back to your GI MD.   CHF on chart review.  No pedal edema on exam.  Clinically does not appear to be in  failure.  You had questions about whether or not you should be on furosemide.  Will get chest x-ray today and BNP.  Also follow your kidney function as this will play a role in deciding potential dosing and frequency of diuretic.  Kidney function has been low in the past.  Chronic left lower rib pain since early 2000's.  We will get left rib x-ray/series today.   For chronic back pain continue to follow-up with your orthopedist.  No angina symptoms presently.  But do have history of intermittent complaints in the past.  Continue aspirin and statin.  Very active recurrent chest pain be seen in the ED.  Follow-up date to be determined based on imaging and lab studies.  But would also asked that you go ahead and schedule  follow-up with your PCP.   Mackie Pai, PA-C   Time spent with patient today was 48 minutes which consisted of chart revdiew, discussing diagnosis, work up treatment and documentation.

## 2021-06-04 ENCOUNTER — Other Ambulatory Visit: Payer: Self-pay | Admitting: Family Medicine

## 2021-06-10 ENCOUNTER — Other Ambulatory Visit: Payer: Self-pay

## 2021-06-10 ENCOUNTER — Ambulatory Visit (HOSPITAL_BASED_OUTPATIENT_CLINIC_OR_DEPARTMENT_OTHER)
Admission: RE | Admit: 2021-06-10 | Discharge: 2021-06-10 | Disposition: A | Discharge status: Home / Self Care | Payer: Medicare HMO | Source: Ambulatory Visit | Attending: Medical | Admitting: Medical

## 2021-06-10 DIAGNOSIS — R1084 Generalized abdominal pain: Secondary | ICD-10-CM | POA: Diagnosis not present

## 2021-06-10 DIAGNOSIS — K5730 Diverticulosis of large intestine without perforation or abscess without bleeding: Secondary | ICD-10-CM | POA: Diagnosis not present

## 2021-06-10 MED ORDER — IOHEXOL 300 MG/ML  SOLN
100.0000 mL | Freq: Once | INTRAMUSCULAR | Status: AC | PRN
  Administered 2021-06-10: 100 mL via INTRAVENOUS

## 2021-06-12 DIAGNOSIS — H52223 Regular astigmatism, bilateral: Secondary | ICD-10-CM | POA: Diagnosis not present

## 2021-06-12 DIAGNOSIS — H524 Presbyopia: Secondary | ICD-10-CM | POA: Diagnosis not present

## 2021-06-18 DIAGNOSIS — G4733 Obstructive sleep apnea (adult) (pediatric): Secondary | ICD-10-CM | POA: Diagnosis not present

## 2021-06-21 DIAGNOSIS — G4733 Obstructive sleep apnea (adult) (pediatric): Secondary | ICD-10-CM | POA: Diagnosis not present

## 2021-06-23 ENCOUNTER — Ambulatory Visit (HOSPITAL_BASED_OUTPATIENT_CLINIC_OR_DEPARTMENT_OTHER)
Admission: RE | Admit: 2021-06-23 | Discharge: 2021-06-23 | Disposition: A | Discharge status: Home / Self Care | Payer: Medicare HMO | Source: Ambulatory Visit | Attending: Medical | Admitting: Medical

## 2021-06-23 ENCOUNTER — Ambulatory Visit (INDEPENDENT_AMBULATORY_CARE_PROVIDER_SITE_OTHER): Payer: Medicare HMO | Admitting: Medical

## 2021-06-23 ENCOUNTER — Telehealth: Payer: Self-pay | Admitting: Medical

## 2021-06-23 ENCOUNTER — Other Ambulatory Visit: Payer: Self-pay

## 2021-06-23 VITALS — BP 130/50 | HR 57 | Resp 18 | Ht <= 58 in | Wt 117.2 lb

## 2021-06-23 DIAGNOSIS — R5383 Other fatigue: Secondary | ICD-10-CM | POA: Diagnosis not present

## 2021-06-23 DIAGNOSIS — R1030 Lower abdominal pain, unspecified: Secondary | ICD-10-CM

## 2021-06-23 DIAGNOSIS — I509 Heart failure, unspecified: Secondary | ICD-10-CM

## 2021-06-23 DIAGNOSIS — R109 Unspecified abdominal pain: Secondary | ICD-10-CM | POA: Diagnosis not present

## 2021-06-23 DIAGNOSIS — R051 Acute cough: Secondary | ICD-10-CM | POA: Diagnosis not present

## 2021-06-23 DIAGNOSIS — R922 Inconclusive mammogram: Secondary | ICD-10-CM | POA: Diagnosis not present

## 2021-06-23 DIAGNOSIS — R197 Diarrhea, unspecified: Secondary | ICD-10-CM

## 2021-06-23 DIAGNOSIS — R1032 Left lower quadrant pain: Secondary | ICD-10-CM

## 2021-06-23 DIAGNOSIS — R9230 Dense breasts, unspecified: Secondary | ICD-10-CM

## 2021-06-23 DIAGNOSIS — R059 Cough, unspecified: Secondary | ICD-10-CM | POA: Diagnosis not present

## 2021-06-23 LAB — CBC WITH DIFFERENTIAL/PLATELET
Basophils Absolute: 0 10*3/uL (ref 0.0–0.1)
Basophils Relative: 0.5 % (ref 0.0–3.0)
Eosinophils Absolute: 0 10*3/uL (ref 0.0–0.7)
Eosinophils Relative: 0 % (ref 0.0–5.0)
HCT: 35.1 % — ABNORMAL LOW (ref 36.0–46.0)
Hemoglobin: 11.3 g/dL — ABNORMAL LOW (ref 12.0–15.0)
Lymphocytes Relative: 34.4 % (ref 12.0–46.0)
Lymphs Abs: 1.5 10*3/uL (ref 0.7–4.0)
MCHC: 32.1 g/dL (ref 30.0–36.0)
MCV: 88.3 fl (ref 78.0–100.0)
Monocytes Absolute: 0.6 10*3/uL (ref 0.1–1.0)
Monocytes Relative: 13.9 % — ABNORMAL HIGH (ref 3.0–12.0)
Neutro Abs: 2.2 10*3/uL (ref 1.4–7.7)
Neutrophils Relative %: 51.2 % (ref 43.0–77.0)
Platelets: 226 10*3/uL (ref 150.0–400.0)
RBC: 3.98 Mil/uL (ref 3.87–5.11)
RDW: 15.3 % (ref 11.5–15.5)
WBC: 4.3 10*3/uL (ref 4.0–10.5)

## 2021-06-23 LAB — COMPREHENSIVE METABOLIC PANEL
ALT: 22 U/L (ref 0–35)
AST: 28 U/L (ref 0–37)
Albumin: 4.2 g/dL (ref 3.5–5.2)
Alkaline Phosphatase: 67 U/L (ref 39–117)
BUN: 22 mg/dL (ref 6–23)
CO2: 30 mEq/L (ref 19–32)
Calcium: 9.2 mg/dL (ref 8.4–10.5)
Chloride: 101 mEq/L (ref 96–112)
Creatinine, Ser: 1.2 mg/dL (ref 0.40–1.20)
GFR: 43.03 mL/min — ABNORMAL LOW (ref >60.00)
Glucose, Bld: 92 mg/dL (ref 70–99)
Potassium: 4.3 mEq/L (ref 3.5–5.1)
Sodium: 139 mEq/L (ref 135–145)
Total Bilirubin: 0.6 mg/dL (ref 0.2–1.2)
Total Protein: 6.9 g/dL (ref 6.0–8.3)

## 2021-06-23 LAB — BRAIN NATRIURETIC PEPTIDE: Pro B Natriuretic peptide (BNP): 159 pg/mL — ABNORMAL HIGH (ref 0.0–100.0)

## 2021-06-23 NOTE — Patient Instructions (Signed)
Chronic abdomen pain with history per chart review of IBS-C and some IBS-D.   Most recent CT showed retained stool throughout the abdomen.  At that time I had advised using magnesium citrate and attempt to clear all stool present.  Since you having a lot of loose stools at that time you decided not to take the magnesium citrate.  Since then you describe significant days of very loose foul-smelling stool.  Now that has subsided but still having some recurrent intermittent moderate left lower quadrant and lower pelvic area pain.  Will get stat CBC, CMP and 1 view abdomen x-ray.  Want to assess degree of potential retained stool/stool burden.  After x-ray might decide to get CT abdomen/pelvis repeat.  Will discuss findings of the studies with Dr. Charlett Blake PCP.  I do want you to do stool studies for bacteria, parasites and C. difficile.  Also turn in I fob.  Regarding your dense breast and imaging warning you to have diagnostic mammogram rather than screening, I decided to send that note to Dr. Frederik Pear MA so they can order the correct study.  I am not sure if insurance would cover diagnostic for only dense breast?  Follow-up date to be determined after lab and imaging review.  Also after study of the you might ask the GI MD if they can see you sooner than scheduled.

## 2021-06-23 NOTE — Addendum Note (Signed)
Addended by: Manuela Schwartz on: 06/23/2021 11:37 AM   Modules accepted: Orders

## 2021-06-23 NOTE — Progress Notes (Signed)
Subjective:    Patient ID: Julie Wilkerson, female    DOB: 03-Nov-1940, 81 y.o.   MRN: [id number]  HPI   Pt in for some persisting abdomen pain. Last note A/P   "Chronic abdomen pain.  More of umbilicus wheezing presently.  History of hiatal hernia and suspect GERD.  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   Recommend that you stay on omeprazole, IB guard and refilling your Levsin which you only used for 2 months after last GI visit.  Recommend using Levsin on days that you have diarrhea.  If you get constipated would recommend hydrating well and using that such as Colace over-the-counter.  We will get CBC, CMP, lipase and place order for CT abdomen and pelvis.  You do have known history of diverticulosis and we will see if any diverticulitis might be present.  CT abdomen pelvis will need to be prior authorized.   Patient referral back to your GI MD"   Pt has appointment with GI on Feb 21st.   Pt CT showed.    IMPRESSION: 1. No acute process identified. 2. Small to moderate sized hiatal hernia. 3. Mild colonic diverticulosis. Retained fecal material throughout the colon.  Since she had retained stool throughout I had advised to use magnesium citrate to try to clear. Pt never took the magnesium. She tells me the diarrha was severe. Stomach feels bloated. Odor to stools. She states had loose stools watery stools from jan 21 thru 24. She took immodium on 25 th and diarrhea stopped. Some black colored spots when had diarrhea.   She had one loose stool on June 19, 2021. She took peptobismal for that.    Over last 3 days she felt tired with a lot of abdomen pain. Today pain is a lot less. More energy today.  Some pain in her rectum at times. Presently 5/10 level abdomen. She points to left lower quadrant and suprapubic region.  Recently coughing over past week at night. She is using mucinex and it helps.  Review of Systems  Constitutional:   Negative for chills, fatigue and fever.  Respiratory:  Negative for cough, chest tightness and wheezing.   Cardiovascular:  Negative for chest pain and palpitations.  Gastrointestinal:  Positive for abdominal pain and diarrhea. Negative for abdominal distention, blood in stool, constipation, nausea, rectal pain and vomiting.  Genitourinary:  Negative for dysuria, flank pain and frequency.  Musculoskeletal:  Negative for back pain, joint swelling, neck pain and neck stiffness.  Skin:  Negative for rash.  Neurological:  Negative for dizziness and headaches.  Psychiatric/Behavioral:  Negative for confusion and hallucinations. The patient is not nervous/anxious.    Past Medical History:  Diagnosis Date   Abdominal pain 11/07/2016   Accelerating angina ([place]) 56/38/9373   Acute diastolic CHF (congestive heart failure) ([place]) 02/16/2019   Acute on chronic diastolic CHF (congestive heart failure) ([place]) 02/23/2019   Acute pansinusitis 04/26/2017   Amblyopia of eye, left 04/19/2018   Anemia    Anginal pain ([facility])    Anxiety    Arthritis    Asthma    Atypical chest pain 02/04/2019   Bilateral carotid bruits 03/28/2017   Blood transfusion without reported diagnosis    Breast cancer ([place])    2002, left, encapsulated, microcalcifications. lumpectomy, radtiation x 30   Breast cancer in female [facility])    Bursitis of left hip 09/04/2018   Change in bowel habits 02/22/2018   Change in mole 06/08/2016  Chest pain 03/04/2020   CHF (congestive heart failure) ([facility])    Chronic bilateral thoracic back pain 05/23/2016   Chronic diastolic congestive heart failure ([place]) 05/28/2019   Colitis    Complication of anesthesia    VERY SENSITIVE TO ANESTHESIA    Constipation 01/17/2018   Coronary artery disease involving native coronary artery of native heart without angina pectoris 05/28/2019   Cortical age-related cataract of right eye 04/18/2018   Cough variant asthma  vs UACS from ACEi     Spirometry  12/02/2016  FEV1 1.26 (76%)  Ratio 78 with min curvature p last saba > 6 h prior  - trial off acei 12/02/2016  - 12/02/2016  After extensive coaching HFA effectiveness =    75% with qvar autohaler > rechallenge with 80 2bid     Decreased visual acuity 01/16/2017   Degeneration of lumbar intervertebral disc 08/01/2018   Diarrhea 11/16/2015   D/o Diverticuli, polyps, celiac disease.     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   Left hand pain 05/07/2018   Left hip pain 03/01/2017   Left leg pain 03/01/2017   Left shoulder pain 05/07/2018   Low back pain 08/10/2015   LUQ pain 06/01/2019   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   Neck pain 03/01/2017   Nuclear sclerotic cataract of right eye 04/18/2018   Osteoporosis    Overweight (BMI 25.0-29.9) 02/14/2019   Pain in joint of left shoulder 08/11/2018   Paroxysmal atrial tachycardia ([place]) 05/28/2019   Pelvic pain 07/02/2018   Personal history of radiation therapy    Pneumonia    PONV (postoperative nausea and vomiting)    Poor appetite 02/22/2018   Pseudophakia of left eye 04/18/2018   PVD (posterior vitreous detachment), right 04/24/2019   Restless sleeper 11/07/2016   Right hip pain 10/23/2017   RLQ discomfort 03/01/2017   RLS (restless legs syndrome) 05/07/2019   S/P left THA, AA 02/13/2019   Sepsis due to pneumonia ([place]) 02/16/2019   Sleep apnea     uses CPAP   Stomach cramps    Tremor 02/15/2016   Urinary frequency 12/22/2017   Urinary incontinence 05/07/2019   Urinary tract infection 11/04/2016   Vitamin D deficiency 04/29/2016     Social History   Socioeconomic History   Marital status: Single    Spouse name: Not on file   Number of children: 3   Years of education: 18   Highest education level: Master's degree (e.g., MA, MS, MEng, MEd, MSW, MBA)  Occupational History   Occupation: retired    Comment: Pharmacist, hospital, kindergarten  Tobacco Use   Smoking status: Never   Smokeless tobacco: Never  Vaping Use   Vaping Use: Never used  Substance and Sexual Activity   Alcohol use: Never    Alcohol/week: 0.0 standard drinks   Drug use: Never   Sexual activity: Not Currently  Birth control/protection: None    Comment: lives alone, avoids daiiry and gluten. 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  Other Topics Concern   Not on file  Social History Narrative   Not on file   Social Determinants of Health   Financial Resource Strain: Low Risk    Difficulty of Paying Living Expenses: Not hard at all  Food Insecurity: No Food Insecurity   Worried About Charity fundraiser in the Last Year: Never true   [place] in the Last Year: Never true  Transportation Needs: No Transportation Needs   Lack of Transportation (Medical): No   Lack of Transportation (Non-Medical): No  Physical Activity: Insufficiently Active   Days of Exercise per Week: 1 day   Minutes of Exercise per Session: 30 min  Stress: No Stress Concern Present   Feeling of Stress : Not at all  Social Connections: Not on file  Intimate Partner Violence: Not At Risk   Fear of Current or Ex-Partner: No   Emotionally Abused: No   Physically Abused: No   Sexually Abused: No    Past Surgical History:  Procedure Laterality Date   ABDOMINAL HYSTERECTOMY     partial   APPENDECTOMY     BREAST LUMPECTOMY Left 2002   CHOLECYSTECTOMY  Age 50 or 22   COLONOSCOPY  2017   EYE  SURGERY Left    cataract   LEFT HEART CATH AND CORONARY ANGIOGRAPHY N/A 02/09/2019   Procedure: LEFT HEART CATH AND CORONARY ANGIOGRAPHY;  Surgeon: Nelva Bush, MD;  Location: [facility] [facility];  Service: Cardiovascular;  Laterality: N/A;   TONSILLECTOMY     TOTAL HIP ARTHROPLASTY Left 02/13/2019   Procedure: TOTAL HIP ARTHROPLASTY ANTERIOR APPROACH;  Surgeon: Paralee Cancel, MD;  Location: [facility];  Service: Orthopedics;  Laterality: Left;  70 mins   UPPER GASTROINTESTINAL ENDOSCOPY      Family History  Problem Relation Age of Onset   Colitis Mother    Irritable bowel syndrome Mother    Hypertension Father    Hyperlipidemia Sister    Hyperlipidemia Brother    Heart attack Brother    Hyperlipidemia Brother    Heart attack Brother    Hyperlipidemia Brother    Heart attack Brother    Stomach cancer Paternal Grandmother 5   Colon cancer Paternal Grandmother    Leukemia Daughter    Arthritis Daughter    Heart disease Daughter        ASD vs VSD   Esophageal cancer Neg Hx    Pancreatic cancer Neg Hx    Rectal cancer Neg Hx     Allergies  Allergen Reactions   Albuterol And Levalbuterol Palpitations    Episode of palpitations, tremor and anxiety when administered levalbuterol for PFT. Recommend avoiding this medicaiton class    Current Outpatient Medications on File Prior to Visit  Medication Sig Dispense Refill   ASPIRIN 81 PO 81 mg daily.      citalopram (CELEXA) 20 MG tablet TAKE 1 TABLET BY MOUTH AT BEDTIME 90 tablet 0   erythromycin ophthalmic ointment SMARTSIG:In Eye(s)     fluticasone (FLOVENT HFA) 110 MCG/ACT inhaler Inhale 1-2 puffs into the lungs 2 (two) times daily as needed. 1 each 12   furosemide (LASIX) 40 MG tablet Take 1 tablet by mouth once daily 90 tablet 1   hyoscyamine (LEVSIN SL) 0.125 MG SL tablet Place 1 tablet (0.125 mg total) under the tongue 2 (two) times daily. 30 tablet 1   levothyroxine (SYNTHROID) 50  MCG tablet TAKE 1 TABLET BY MOUTH ONCE  DAILY BEFORE BREAKFAST 90 tablet 0   lisinopril (ZESTRIL) 10 MG tablet Take 1 tablet by mouth twice daily as needed 180 tablet 0   metoprolol succinate (TOPROL-XL) 25 MG 24 hr tablet TAKE 1 TABLET BY MOUTH AT BEDTIME 90 tablet 0   NON FORMULARY as needed (wheezing, SOB). Iprasynt inhaler (unable to find english equivalent med name)     omega-3 acid ethyl esters (LOVAZA) 1 g capsule Take 2 g by mouth daily.     omeprazole (PRILOSEC) 40 MG capsule Take 1 capsule (40 mg total) by mouth in the morning and at bedtime. Take 1 tablet by mouth once to twice daily 60 capsule 5   ondansetron (ZOFRAN) 4 MG tablet Dissolve 1 tablet under tongue once to twice a day, every day 90 tablet 3   potassium chloride (KLOR-CON) 10 MEQ tablet Take 1 tablet by mouth once daily 90 tablet 0   sucralfate (CARAFATE) 1 g tablet Take 1 tablet (1 g total) by mouth 2 (two) times daily. 60 tablet 0   No current facility-administered medications on file prior to visit.    BP (!) 130/50    Pulse (!) 57    Resp 18    Ht 4\' 10"  (1.473 m)    Wt 117 lb 3.2 oz (53.2 kg)    SpO2 96%    BMI 24.49 kg/m        Objective:   Physical Exam  General Mental Status- Alert. General Appearance- Not in acute distress.   Skin General: Color- Normal Color. Moisture- Normal Moisture.  Neck Carotid Arteries- Normal color. Moisture- Normal Moisture. No carotid bruits. No JVD.  Chest and Lung Exam Auscultation: Breath Sounds:-Normal.  Cardiovascular Auscultation:Rythm- Regular. Murmurs & Other Heart Sounds:Auscultation of the heart reveals- No Murmurs.  Abdomen Inspection:-Inspeection Normal. Palpation/Percussion:Note:No mass. Palpation and Percussion of the abdomen reveal- moderate left lower quadrant and suprapubic Tenderness, Non Distended + BS, no rebound or guarding.   Neurologic Cranial Nerve exam:- CN III-XII intact(No nystagmus), symmetric smile. Strength:- 5/5 equal and symmetric strength both upper and lower  extremities.       Assessment & Plan:   Patient Instructions  Chronic abdomen pain with history per chart review of IBS-C and some IBS-D.   Most recent CT showed retained stool throughout the abdomen.  At that time I had advised using magnesium citrate and attempt to clear all stool present.  Since you having a lot of loose stools at that time you decided not to take the magnesium citrate.  Since then you describe significant days of very loose foul-smelling stool.  Now that has subsided but still having some recurrent intermittent moderate left lower quadrant and lower pelvic area pain.  Will get stat CBC, CMP and 1 view abdomen x-ray.  Want to assess degree of potential retained stool/stool burden.  After x-ray might decide to get CT abdomen/pelvis repeat.  Will discuss findings of the studies with Dr. Charlett Blake PCP.  I do want you to do stool studies for bacteria, parasites and C. difficile.  Also turn in I fob.  Regarding your dense breast and imaging warning you to have diagnostic mammogram rather than screening, I decided to send that note to Dr. Frederik Pear MA so they can order the correct study.  I am not sure if insurance would cover diagnostic for only dense breast?  Follow-up date to be determined after lab and imaging review.  Also after study of the you  might ask the GI MD if they can see you sooner than scheduled.    Mackie Pai, PA-C    Time spent with patient today was  40  minutes which consisted of chart revdiew, discussing diagnosis, work up treatment and documentation.

## 2021-06-23 NOTE — Telephone Encounter (Signed)
Patient is mention she cannot get a screening mammogram but needs to be diagnostic.  On review of most recent mammogram and only states dense breast but no other concern.  Would you coordinate with Dr. Charlett Blake and placed order as I do not fully understand reason for diagnostic testing.  Below is the note in epic on screening order.  * SCREENING TURNED DIAGNOSTIC*  PQS:[id number]  PF:07/08/20@[facility]   ANNUAL// NO PROBLEMS// NO HX OF BREAST CANCER  PREV:  NO NEEDS  AJ SW PT

## 2021-06-23 NOTE — Addendum Note (Signed)
Addended by: Anabel Halon on: 06/23/2021 07:47 PM   Modules accepted: Orders

## 2021-06-24 ENCOUNTER — Other Ambulatory Visit: Payer: Self-pay | Admitting: Family Medicine

## 2021-06-24 ENCOUNTER — Ambulatory Visit (HOSPITAL_BASED_OUTPATIENT_CLINIC_OR_DEPARTMENT_OTHER): Payer: Medicare HMO

## 2021-06-24 ENCOUNTER — Other Ambulatory Visit: Payer: Medicare HMO

## 2021-06-24 DIAGNOSIS — R197 Diarrhea, unspecified: Secondary | ICD-10-CM

## 2021-06-24 DIAGNOSIS — R922 Inconclusive mammogram: Secondary | ICD-10-CM

## 2021-06-25 ENCOUNTER — Telehealth: Payer: Self-pay | Admitting: Family Medicine

## 2021-06-25 NOTE — Telephone Encounter (Signed)
I am happy to see her back and sort things out. Our office will contact her.  Her symptoms are chronic dating back years and she has had numerous imaging findings and endoscopies which have not shown anything bad, but can reassess her. Thanks  [place] can you help get this patient scheduled for an office follow up with me? Thanks

## 2021-06-25 NOTE — Telephone Encounter (Signed)
Patient stated she is having stomach pain but has been taking hyoscyamine.  Advised patient since she has had a recent MRI/CT done insurance will not cover until June and to visit the ed to have one done.  Patient declined and stated she will see her provider tues 2/7

## 2021-06-25 NOTE — Telephone Encounter (Signed)
Dr. Havery Moros,  I have recently seen pt for moderate to severe abdomen pain  twice twice in January. She has history of abdomen pain in past and has seen you. I believe negative works up in past. She may have ibs. I did Ct abdomen and pelvis around 06-03-2021 which only showed small to moderate sized hiatal hernia and mild colonic diverticulosis. Retained fecal material throughout the colon.  She had a lot diarrhea after ct and on follow up still had discomfort/moderate to severe pain. Xray on follow up did not show any significant stool burden.  I placed referral recently. Pt wanted me to see if her visit could be moved up?   Thanks for your help.  Mackie Pai, PA-C

## 2021-06-26 LAB — CLOSTRIDIUM DIFFICILE BY PCR: Toxigenic C. Difficile by PCR: NEGATIVE

## 2021-06-26 NOTE — Telephone Encounter (Signed)
Called and spoke with patient. Her appt has been moved up to Monday, 06/29/21 at 3:40 pm. Pt verbalized understanding and had no concerns at the end of the call.

## 2021-06-29 ENCOUNTER — Encounter: Payer: Self-pay | Admitting: Gastroenterology

## 2021-06-29 ENCOUNTER — Ambulatory Visit: Payer: Medicare HMO | Admitting: Gastroenterology

## 2021-06-29 ENCOUNTER — Other Ambulatory Visit: Payer: Medicare HMO

## 2021-06-29 VITALS — BP 120/60 | HR 56 | Ht <= 58 in | Wt 117.0 lb

## 2021-06-29 DIAGNOSIS — R109 Unspecified abdominal pain: Secondary | ICD-10-CM

## 2021-06-29 DIAGNOSIS — R194 Change in bowel habit: Secondary | ICD-10-CM

## 2021-06-29 LAB — OVA AND PARASITE EXAMINATION
CONCENTRATE RESULT:: NONE SEEN
MICRO NUMBER:: 12949143
SPECIMEN QUALITY:: ADEQUATE
TRICHROME RESULT:: NONE SEEN

## 2021-06-29 MED ORDER — RIFAXIMIN 550 MG PO TABS: 550.0000 mg | ORAL_TABLET | Freq: Three times a day (TID) | ORAL | 0 refills | Status: AC

## 2021-06-29 MED ORDER — OMEPRAZOLE 40 MG PO CPDR: 40.0000 mg | DELAYED_RELEASE_CAPSULE | Freq: Two times a day (BID) | ORAL | 5 refills | Status: DC

## 2021-06-29 NOTE — Progress Notes (Signed)
HPI :  81 year old female here for follow-up visit with me for abdominal pain. I have seen her in the past for multiple abdominal symptoms.  She has a history of GERD, dyspepsia, hiatal hernia.   I last saw her in September 2022.  At that point time she had acute on chronic worsening of her dyspepsia and upper tract symptoms with pain.  Ultimately she proceeded with an EGD, was noted to have 5 cm hiatal hernia otherwise normal exam and biopsies negative for H. pylori.  I had placed her on FD guard for treatment of dyspepsia, she states it did help somewhat.  She states she had a severe gastroenteritis roughly 2 weeks ago.  Developed severe diarrhea for about 2 to 3 days with severe abdominal cramping.  This has improved in regards to the diarrhea, back to her usual baseline, she has some mild intermittent loose stools at baseline.  Taking Imodium helped.  She had an infectious work-up that was negative for C. difficile, bacterial infection, parasites etc.  She states for the past 3 weeks in general she has had pain in her bilateral sides, lower abdomen, and upper abdomen.  States she feels severely bloated with a lot of gas, her stomach feels hard and tender to palpation.  Having a bowel movement does not really make her feel much better.  She does find that Levsin has helped this when she takes it as well as the FD guard.  She was seen by Mackie Pai for worsening pain and she had a CT scan on January 18 which showed no acute cause for her pain.  She has had lab work otherwise that has been normal without clear cause for her symptoms.  She inquires about other options.  She takes omeprazole once to twice daily.  She is been on Carafate and Zofran in the past.  Levsin as well.  She is taking Imodium for diarrhea as needed which has worked well for her.  Colonoscopy up-to-date, work-up as outlined below   Prior work-up: CT scan 10/24/18 - spinal stenosis lumbar spine   CT scan 12/23/2017 -  diverticulosis, no cause for pain  CT scan 07/19/2017 - no cause for pain noted   Colonoscopy 08/05/2015 - AVM in the cecum noted, ablated with APC. Ileum normal. No polyps or evidence of colitis. Biopsies not taken.  EGD 08/05/2015 - 5cm HH, mild gastritis on path, mild esophagitis with esophagus with normal path, normal duodenum on path   CT scan 06/06/2019: IMPRESSION: 1. No acute findings in the abdomen or pelvis. 2. Moderate hiatal hernia with 25-50% of the stomach contained in the chest. 3. Left colonic diverticulosis without diverticulitis. 4.  Aortic Atherosclerois (ICD10-170.0)   GES 12/07/18 - normal   Colonoscopy 08/14/19 - - The examined portion of the ileum was normal. - One diminutive polyp in the cecum, removed with a cold biopsy forceps. Resected and retrieved. - Two diminutive polyps in the transverse colon, removed with a cold biopsy forceps. Resected and retrieved. - One 5 mm polyp at the splenic flexure, removed with a cold snare. Resected and retrieved. - One 6 mm polyp in the descending colon, removed with a cold snare. Resected and retrieved. - Diverticulosis in the sigmoid colon. - Internal hemorrhoids. - The examination was otherwise normal. - Biopsies were taken with a cold forceps from the right colon, left colon and transverse colon for evaluation of microscopic colitis.   Path negative for MC     EGD 11/28/19 -  -  A 5 cm hiatal hernia was present. No obvious cameron lesions noted. - The exam of the esophagus was otherwise normal. - Patchy mildly erythematous mucosa was found in the gastric antrum. - The exam of the stomach was otherwise normal. No ulcers - Biopsies were taken with a cold forceps in the gastric body, at the incisura and in the gastric antrum for Helicobacter pylori testing. - The duodenal bulb and second portion of the duodenum were normal. Biopsies for histology were taken with a cold forceps for evaluation of celiac disease.   1.  Surgical [P], duodenal bulb, 2nd portion of duodenum and distal duodenum - BENIGN DUODENAL MUCOSA. - NO FEATURES OF CELIAC SPRUE OR GRANULOMAS. 2. Surgical [P], gastric antrum and gastric body - ANTRAL AND OXYNTIC MUCOSA WITH HYPEREMIA. - WARTHIN-STARRY NEGATIVE FOR HELICOBACTER PYLORI. - NO INTESTINAL METAPLASIA, DYSPLASIA OR CARCINOMA.   CT abdomen pelvis with contrast 09/23/2020 IMPRESSION: Colonic diverticulosis. No radiographic evidence of diverticulitis or other acute findings. Small hiatal hernia.     CT angio chest / abdomen / pelvis 03/04/20 - IMPRESSION: 1. Normal contour and caliber of the thoracic and abdominal aorta. No evidence of aneurysm, dissection, or other acute aortic pathology. Scattered atherosclerosis. 2. No acute findings in the chest, abdomen or pelvis to explain pain. 3. Small hiatal hernia. 4. Sigmoid diverticulosis without evidence of diverticulitis. 5. Status post cholecystectomy and hysterectomy     CT abdomen / pelvis with contrast 09/23/20 -  IMPRESSION: Colonic diverticulosis. No radiographic evidence of diverticulitis or other acute findings. Small hiatal hernia.   CT chest angio 12/23/20: IMPRESSION: Negative for significant acute pulmonary embolus by CTA.  No other acute intrathoracic vascular finding. Diffuse bilateral patchy ground-glass opacities, nonspecific. See above comment. Moderate hiatal hernia   FOBT negative 09/23/20   EGD 02/11/21 -  - A 5 cm hiatal hernia was present. - The exam of the esophagus was otherwise normal. - The entire examined stomach was normal. Biopsies were taken with a cold forceps for Helicobacter pylori testing. - The duodenal bulb and second portion of the duodenum were normal.  Surgical [P], gastric antrum and gastric body - BENIGN GASTRIC MUCOSA. [name] IS NEGATIVE FOR HELICOBACTER PYLORI. - NO INTESTINAL METAPLASIA, DYSPLASIA, OR MALIGNANCY.  CT scan 06/10/2021: IMPRESSION: 1. No acute  process identified. 2. Small to moderate sized hiatal hernia. 3. Mild colonic diverticulosis. 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 lumpectomy, radtiation x 30   Breast cancer in female [facility])    Bursitis of left hip 09/04/2018   Change in bowel habits 02/22/2018   Change in mole 06/08/2016   Chest pain 03/04/2020   CHF (congestive heart failure) (HCC)    Chronic bilateral thoracic back pain 05/23/2016   Chronic diastolic congestive heart failure ([place]) 05/28/2019   Colitis    Complication of anesthesia    VERY SENSITIVE TO ANESTHESIA  Constipation 01/17/2018   Coronary artery disease involving native coronary artery of native heart without angina pectoris 05/28/2019   Cortical age-related cataract of right eye 04/18/2018   Cough variant asthma  vs UACS from ACEi     Spirometry 12/02/2016  FEV1 1.26 (76%)  Ratio 78 with min curvature p last saba > 6 h prior  - trial off acei 12/02/2016  - 12/02/2016  After extensive coaching HFA effectiveness =    75% with qvar autohaler > rechallenge with 80 2bid     Decreased visual acuity 01/16/2017   Degeneration of lumbar intervertebral disc 08/01/2018   Diarrhea 11/16/2015   D/o Diverticuli, polyps, celiac disease.     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   Left hand pain 05/07/2018   Left hip pain 03/01/2017   Left leg pain 03/01/2017   Left shoulder pain 05/07/2018   Low back pain 08/10/2015   LUQ pain 06/01/2019   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   Neck pain 03/01/2017   Nuclear sclerotic cataract of right eye 04/18/2018   Osteoporosis    Overweight (BMI 25.0-29.9) 02/14/2019   Pain in joint of left shoulder 08/11/2018   Paroxysmal atrial tachycardia ([place]) 05/28/2019   Pelvic pain 07/02/2018   Personal history of radiation therapy    Pneumonia    PONV (postoperative nausea and vomiting)    Poor appetite 02/22/2018   Pseudophakia of left eye 04/18/2018   PVD (posterior vitreous detachment), right 04/24/2019   Restless sleeper 11/07/2016   Right hip pain 10/23/2017   RLQ discomfort 03/01/2017   RLS (restless legs syndrome) 05/07/2019   S/P left THA, AA 02/13/2019   Sepsis due to pneumonia ([place]) 02/16/2019   Sleep apnea    uses CPAP   Stomach cramps    Tremor 02/15/2016   Urinary frequency 12/22/2017   Urinary incontinence 05/07/2019   Urinary tract infection 11/04/2016   Vitamin D deficiency 04/29/2016     Past Surgical History:  Procedure Laterality Date   ABDOMINAL HYSTERECTOMY     partial   APPENDECTOMY     BREAST LUMPECTOMY Left 2002   CHOLECYSTECTOMY  Age 70 or 39   COLONOSCOPY  2017   EYE SURGERY Left    cataract   LEFT HEART CATH AND CORONARY ANGIOGRAPHY N/A 02/09/2019   Procedure: LEFT HEART CATH AND CORONARY  ANGIOGRAPHY;  Surgeon: Nelva Bush, MD;  Location: [place];  Service: Cardiovascular;  Laterality: N/A;   TONSILLECTOMY     TOTAL HIP ARTHROPLASTY Left 02/13/2019   Procedure: TOTAL HIP ARTHROPLASTY ANTERIOR APPROACH;  Surgeon: Paralee Cancel, MD;  Location: [facility];  Service: Orthopedics;  Laterality: Left;  70 mins   UPPER GASTROINTESTINAL ENDOSCOPY     Family History  Problem Relation Age of Onset   Colitis Mother    Irritable bowel syndrome Mother    Hypertension Father  Hyperlipidemia Sister    Hyperlipidemia Brother    Heart attack Brother    Hyperlipidemia Brother    Heart attack Brother    Hyperlipidemia Brother    Heart attack Brother    Stomach cancer Paternal Grandmother 68   Colon cancer Paternal Grandmother    Leukemia Daughter    Arthritis Daughter    Heart disease Daughter        ASD vs VSD   Esophageal cancer Neg Hx    Pancreatic cancer Neg Hx    Rectal cancer Neg Hx    Social History   Tobacco Use   Smoking status: Never   Smokeless tobacco: Never  Vaping Use   Vaping Use: Never used  Substance Use Topics   Alcohol use: Never    Alcohol/week: 0.0 standard drinks   Drug use: Never   Current Outpatient Medications  Medication Sig Dispense Refill   ASPIRIN 81 PO 81 mg daily.      citalopram (CELEXA) 20 MG tablet TAKE 1 TABLET BY MOUTH AT BEDTIME 90 tablet 0   erythromycin ophthalmic ointment SMARTSIG:In Eye(s)     fluticasone (FLOVENT HFA) 110 MCG/ACT inhaler Inhale 1-2 puffs into the lungs 2 (two) times daily as needed. 1 each 12   furosemide (LASIX) 40 MG tablet Take 1 tablet by mouth once daily 90 tablet 1   hyoscyamine (LEVSIN SL) 0.125 MG SL tablet Place 1 tablet (0.125 mg total) under the tongue 2 (two) times daily. 30 tablet 1   levothyroxine (SYNTHROID) 50 MCG tablet TAKE 1 TABLET BY MOUTH ONCE DAILY BEFORE BREAKFAST 90 tablet 0   lisinopril (ZESTRIL) 10 MG tablet Take 1 tablet by mouth twice daily as needed 180 tablet 0    metoprolol succinate (TOPROL-XL) 25 MG 24 hr tablet TAKE 1 TABLET BY MOUTH AT BEDTIME 90 tablet 0   NON FORMULARY as needed (wheezing, SOB). Iprasynt inhaler (unable to find english equivalent med name)     omega-3 acid ethyl esters (LOVAZA) 1 g capsule Take 2 g by mouth daily.     omeprazole (PRILOSEC) 40 MG capsule Take 1 capsule (40 mg total) by mouth in the morning and at bedtime. Take 1 tablet by mouth once to twice daily 60 capsule 5   ondansetron (ZOFRAN) 4 MG tablet Dissolve 1 tablet under tongue once to twice a day, every day 90 tablet 3   potassium chloride (KLOR-CON) 10 MEQ tablet Take 1 tablet by mouth once daily 90 tablet 0   sucralfate (CARAFATE) 1 g tablet Take 1 tablet (1 g total) by mouth 2 (two) times daily. 60 tablet 0   No current facility-administered medications for this visit.   Allergies  Allergen Reactions   Albuterol And Levalbuterol Palpitations    Episode of palpitations, tremor and anxiety when administered levalbuterol for PFT. Recommend avoiding this medicaiton class     Review of Systems: All systems reviewed and negative except where noted in HPI.    Lab Results  Component Value Date   WBC 4.3 06/23/2021   HGB 11.3 (L) 06/23/2021   HCT 35.1 (L) 06/23/2021   MCV 88.3 06/23/2021   PLT 226.0 06/23/2021   Lab Results  Component Value Date   CREATININE 1.20 06/23/2021   BUN 22 06/23/2021   NA 139 06/23/2021   K 4.3 06/23/2021   CL 101 06/23/2021   CO2 30 06/23/2021    Lab Results  Component Value Date   ALT 22 06/23/2021   AST 28 06/23/2021   ALKPHOS  67 06/23/2021   BILITOT 0.6 06/23/2021     Physical Exam: BP 120/60    Pulse (!) 56    Ht 4\' 10"  (1.473 m)    Wt 117 lb (53.1 kg)    BMI 24.45 kg/m  Constitutional: Pleasant,well-developed, female in no acute distress. Abdominal: Soft, nondistended, nontender. There are no masses palpable.  Extremities: no edema Neurological: Alert and oriented to person place and time. Skin: Skin is warm  and dry. No rashes noted. Psychiatric: Normal mood and affect. Behavior is normal.   ASSESSMENT AND PLAN: 81 year old female here for reassessment of the following:  Abdominal pain Altered bowel habits  As above patient has chronic dyspepsia with a variety of abdominal pains that have been ongoing for years with an extensive evaluation with multiple imaging modalities, endoscopy, and colonoscopy.  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  I do think she probably had a acute viral gastroenteritis in recent weeks, several family members have been sick including herself which seems to have made her symptoms worse.  Tested negative for infection.  She has had some persistent abdominal pain for a few weeks now, significant amount of gas and bloating that bothers her.  We discussed option.  I reassured her that her blood work and CT scans look okay I do not see anything bad.  Her EGD and colonoscopy are up-to-date.  She has some baseline loose stools at times, I will screen her for fecal pancreatic insufficiency with a stool test but her pancreas looks okay on imaging.  With all of the gas/bloating she is experiencing, I think she may benefit from a course of rifaximin which may ease her abdominal discomfort.  I gave her a 2-week free sample that we had in the office today as this will be very expensive for her to get in the pharmacy, she was very grateful for this.  See how she responds to rifaximin.  Longer-term, she has a lot of chronic pain/discomfort in her abdomen, I wonder if she may benefit from a switch of her Celexa to Cymbalta.  I will touch base with her primary care team about this. She agreed with the plan.  Plan: - stool for fecal pancreatic elastase - provided free Rifaximin sample - 2 week course - 550mg  TID for ongoing bloating / abdominal discomfort  - consider switch from SSRI to cymbalta in the setting of  chronic pain / bloating, will discuss with primary care team  Jolly Mango, MD [facility]

## 2021-06-29 NOTE — Patient Instructions (Addendum)
If you are age 80 or older, your body mass index should be between 23-30. Your Body mass index is 24.45 kg/m. If this is out of the aforementioned range listed, please consider follow up with your Primary Care Provider.  If you are age 71 or younger, your body mass index should be between 19-25. Your Body mass index is 24.45 kg/m. If this is out of the aformentioned range listed, please consider follow up with your Primary Care Provider.   ________________________________________________________  The  GI providers would like to encourage you to use [facility] to communicate with providers for non-urgent requests or questions.  Due to long hold times on the telephone, sending your provider a message by [facility] may be a faster and more efficient way to get a response.  Please allow 48 business hours for a response.  Please remember that this is for non-urgent requests.  _______________________________________________________  Please go to the lab in the basement of our building to have lab work done as you leave today. Hit "B" for basement when you get on the elevator.  When the doors open the lab is on your left.  We will call you with the results. Thank you.  We have given you samples of the following medication to take: Xifaxan 550 mg: Take one tablet three times a day for 14 days  We have sent the following medications to your pharmacy for you to pick up at your convenience: Omeprazole: once to twice daily   Thank you for entrusting me with your care and for choosing [employer], Dr. Marydel Cellar

## 2021-06-30 ENCOUNTER — Ambulatory Visit (INDEPENDENT_AMBULATORY_CARE_PROVIDER_SITE_OTHER): Payer: Medicare HMO | Admitting: Family Medicine

## 2021-06-30 ENCOUNTER — Encounter: Payer: Self-pay | Admitting: Family Medicine

## 2021-06-30 ENCOUNTER — Telehealth: Payer: Self-pay | Admitting: Family Medicine

## 2021-06-30 ENCOUNTER — Other Ambulatory Visit: Payer: Medicare HMO

## 2021-06-30 VITALS — BP 126/66 | HR 61 | Temp 98.2°F | Resp 16 | Wt 117.0 lb

## 2021-06-30 DIAGNOSIS — R194 Change in bowel habit: Secondary | ICD-10-CM | POA: Diagnosis not present

## 2021-06-30 DIAGNOSIS — E559 Vitamin D deficiency, unspecified: Secondary | ICD-10-CM

## 2021-06-30 DIAGNOSIS — N390 Urinary tract infection, site not specified: Secondary | ICD-10-CM | POA: Diagnosis not present

## 2021-06-30 DIAGNOSIS — R197 Diarrhea, unspecified: Secondary | ICD-10-CM

## 2021-06-30 DIAGNOSIS — E782 Mixed hyperlipidemia: Secondary | ICD-10-CM | POA: Diagnosis not present

## 2021-06-30 DIAGNOSIS — L578 Other skin changes due to chronic exposure to nonionizing radiation: Secondary | ICD-10-CM

## 2021-06-30 DIAGNOSIS — E611 Iron deficiency: Secondary | ICD-10-CM

## 2021-06-30 DIAGNOSIS — M810 Age-related osteoporosis without current pathological fracture: Secondary | ICD-10-CM | POA: Diagnosis not present

## 2021-06-30 DIAGNOSIS — R700 Elevated erythrocyte sedimentation rate: Secondary | ICD-10-CM | POA: Diagnosis not present

## 2021-06-30 DIAGNOSIS — E039 Hypothyroidism, unspecified: Secondary | ICD-10-CM | POA: Diagnosis not present

## 2021-06-30 DIAGNOSIS — R739 Hyperglycemia, unspecified: Secondary | ICD-10-CM | POA: Diagnosis not present

## 2021-06-30 DIAGNOSIS — K589 Irritable bowel syndrome without diarrhea: Secondary | ICD-10-CM

## 2021-06-30 DIAGNOSIS — R109 Unspecified abdominal pain: Secondary | ICD-10-CM | POA: Diagnosis not present

## 2021-06-30 DIAGNOSIS — F419 Anxiety disorder, unspecified: Secondary | ICD-10-CM

## 2021-06-30 DIAGNOSIS — I10 Essential (primary) hypertension: Secondary | ICD-10-CM | POA: Diagnosis not present

## 2021-06-30 LAB — LIPID PANEL
Cholesterol: 247 mg/dL — ABNORMAL HIGH (ref 0–200)
HDL: 64.1 mg/dL (ref >39.00)
LDL Cholesterol: 162 mg/dL — ABNORMAL HIGH (ref 0–99)
NonHDL: 183.22
Total CHOL/HDL Ratio: 4
Triglycerides: 105 mg/dL (ref 0.0–149.0)
VLDL: 21 mg/dL (ref 0.0–40.0)

## 2021-06-30 LAB — CBC WITH DIFFERENTIAL/PLATELET
Basophils Absolute: 0 10*3/uL (ref 0.0–0.1)
Basophils Relative: 0.6 % (ref 0.0–3.0)
Eosinophils Absolute: 0 10*3/uL (ref 0.0–0.7)
Eosinophils Relative: 0 % (ref 0.0–5.0)
HCT: 33.9 % — ABNORMAL LOW (ref 36.0–46.0)
Hemoglobin: 11.2 g/dL — ABNORMAL LOW (ref 12.0–15.0)
Lymphocytes Relative: 36.2 % (ref 12.0–46.0)
Lymphs Abs: 1.7 10*3/uL (ref 0.7–4.0)
MCHC: 33.2 g/dL (ref 30.0–36.0)
MCV: 86.9 fl (ref 78.0–100.0)
Monocytes Absolute: 0.6 10*3/uL (ref 0.1–1.0)
Monocytes Relative: 13.2 % — ABNORMAL HIGH (ref 3.0–12.0)
Neutro Abs: 2.3 10*3/uL (ref 1.4–7.7)
Neutrophils Relative %: 50 % (ref 43.0–77.0)
Platelets: 217 10*3/uL (ref 150.0–400.0)
RBC: 3.9 Mil/uL (ref 3.87–5.11)
RDW: 15.2 % (ref 11.5–15.5)
WBC: 4.7 10*3/uL (ref 4.0–10.5)

## 2021-06-30 LAB — URINALYSIS
Bilirubin Urine: NEGATIVE
Hgb urine dipstick: NEGATIVE
Ketones, ur: NEGATIVE
Leukocytes,Ua: NEGATIVE
Nitrite: NEGATIVE
Specific Gravity, Urine: <=1.005 — AB (ref 1.000–1.030)
Total Protein, Urine: NEGATIVE
Urine Glucose: NEGATIVE
Urobilinogen, UA: 0.2 (ref 0.0–1.0)
pH: 6 (ref 5.0–8.0)

## 2021-06-30 LAB — COMPREHENSIVE METABOLIC PANEL
ALT: 14 U/L (ref 0–35)
AST: 19 U/L (ref 0–37)
Albumin: 4.3 g/dL (ref 3.5–5.2)
Alkaline Phosphatase: 67 U/L (ref 39–117)
BUN: 28 mg/dL — ABNORMAL HIGH (ref 6–23)
CO2: 29 mEq/L (ref 19–32)
Calcium: 9.1 mg/dL (ref 8.4–10.5)
Chloride: 102 mEq/L (ref 96–112)
Creatinine, Ser: 1.19 mg/dL (ref 0.40–1.20)
GFR: 43.46 mL/min — ABNORMAL LOW (ref >60.00)
Glucose, Bld: 88 mg/dL (ref 70–99)
Potassium: 4.3 mEq/L (ref 3.5–5.1)
Sodium: 137 mEq/L (ref 135–145)
Total Bilirubin: 0.7 mg/dL (ref 0.2–1.2)
Total Protein: 6.9 g/dL (ref 6.0–8.3)

## 2021-06-30 LAB — HEMOGLOBIN A1C: Hgb A1c MFr Bld: 6.3 % (ref 4.6–6.5)

## 2021-06-30 LAB — STOOL CULTURE: E coli, Shiga toxin Assay: NEGATIVE

## 2021-06-30 LAB — SEDIMENTATION RATE: Sed Rate: 50 mm/hr — ABNORMAL HIGH (ref 0–30)

## 2021-06-30 LAB — TSH: TSH: 2.37 u[IU]/mL (ref 0.35–5.50)

## 2021-06-30 MED ORDER — DULOXETINE HCL 60 MG PO CPEP: 60.0000 mg | ORAL_CAPSULE | Freq: Every day | ORAL | 3 refills | Status: DC

## 2021-06-30 NOTE — Telephone Encounter (Signed)
Julie Wilkerson asking for a 9 week follow up Where should we put her  Please advise

## 2021-06-30 NOTE — Patient Instructions (Addendum)
Sarna lotion helps with itching  Seborrheic Keratosis A seborrheic keratosis is a common, noncancerous (benign) skin growth. These growths are velvety, waxy, rough, tan, brown, or black spots that appear on the skin. These skin growths can be flat or raised, and scaly. What are the causes? The cause of this condition is not known. What increases the risk? You are more likely to develop this condition if you: Have a family history of seborrheic keratosis. Are 50 or older. Are pregnant. Have had estrogen replacement therapy. What are the signs or symptoms? Symptoms of this condition include growths on the face, chest, shoulders, back, or other areas. These growths: Are usually painless, but may become irritated and itchy. Can be yellow, brown, black, or other colors. Are slightly raised or have a flat surface. Are sometimes rough or wart-like in texture. Are often velvety or waxy on the surface. Are round or oval-shaped. Often occur in groups, but may occur as a single growth. How is this diagnosed? This condition is diagnosed with a medical history and physical exam. A sample of the growth may be tested (skin biopsy). You may need to see a skin specialist (dermatologist). How is this treated? Treatment is not usually needed for this condition, unless the growths are irritated or bleed often. You may also choose to have the growths removed if you do not like their appearance. Most commonly, these growths are treated with a procedure in which liquid nitrogen is applied to "freeze" off the growth (cryosurgery). They may also be burned off with electricity (electrocautery) or removed by scraping (curettage). Follow these instructions at home: Watch your growth for any changes. Keep all follow-up visits as told by your health care provider. This is important. Do not scratch or pick at the growth or growths. This can cause them to become irritated or infected. Contact a health care  provider if: You suddenly have many new growths. Your growth bleeds, itches, or hurts. Your growth suddenly becomes larger or changes color. Summary A seborrheic keratosis is a common, noncancerous (benign) skin growth. Treatment is not usually needed for this condition, unless the growths are irritated or bleed often. Watch your growth for any changes. Contact a health care provider if you suddenly have many new growths or your growth suddenly becomes larger or changes color. Keep all follow-up visits as told by your health care provider. This is important. This information is not intended to replace advice given to you by your health care provider. Make sure you discuss any questions you have with your health care provider. Document Revised: 09/22/2017 Document Reviewed: 09/22/2017 Elsevier Patient Education  2022 [employer].

## 2021-07-01 ENCOUNTER — Telehealth: Payer: Self-pay | Admitting: Family Medicine

## 2021-07-01 LAB — IRON,TIBC AND FERRITIN PANEL
%SAT: 19 % (calc) (ref 16–45)
Ferritin: 20 ng/mL (ref 16–288)
Iron: 73 ug/dL (ref 45–160)
TIBC: 384 mcg/dL (calc) (ref 250–450)

## 2021-07-01 LAB — URINE CULTURE
MICRO NUMBER:: 12974564
Result:: NO GROWTH
SPECIMEN QUALITY:: ADEQUATE

## 2021-07-01 NOTE — Telephone Encounter (Signed)
Pt stated imaging center is unable to do her mammogram because the order is listed for dense tissue instead of breast pain and they need it changed to give her a diagnostic mammogram. Please advise.

## 2021-07-02 ENCOUNTER — Other Ambulatory Visit: Payer: Self-pay

## 2021-07-02 DIAGNOSIS — N644 Mastodynia: Secondary | ICD-10-CM

## 2021-07-02 NOTE — Telephone Encounter (Signed)
Ordered placed

## 2021-07-03 ENCOUNTER — Other Ambulatory Visit: Payer: Self-pay | Admitting: Family Medicine

## 2021-07-03 DIAGNOSIS — N644 Mastodynia: Secondary | ICD-10-CM

## 2021-07-06 DIAGNOSIS — L578 Other skin changes due to chronic exposure to nonionizing radiation: Secondary | ICD-10-CM | POA: Insufficient documentation

## 2021-07-06 NOTE — Assessment & Plan Note (Signed)
Cymbalta 60 mg daily.

## 2021-07-06 NOTE — Assessment & Plan Note (Signed)
She continues to follow with gastroenterology and had been improving although she notes she feels a little more nauseous again the past couple of weeks. No other new symptoms. No change in therapy at this time

## 2021-07-06 NOTE — Assessment & Plan Note (Signed)
On Levothyroxine, continue to monitor 

## 2021-07-06 NOTE — Assessment & Plan Note (Signed)
Supplement and monitor 

## 2021-07-06 NOTE — Assessment & Plan Note (Signed)
Continue to monitor, check UA and urine culture.

## 2021-07-06 NOTE — Assessment & Plan Note (Signed)
Referred to dermatology for further evaluation.

## 2021-07-06 NOTE — Progress Notes (Signed)
Subjective:    Patient ID: Julie Wilkerson, female    DOB: [birth year], 81 y.o.   MRN: [id number]  Chief Complaint  Patient presents with   Follow-up    Sick to stomach for 3 weeks    ,bb    HPI Patient is in today for follow up on chronic medical concerns. No recent febrile illness or hospitalizations. She continues to struggle with intermittent abdominal and pelvic discomfort. It has improved but still persists. She notes a flare in nausea a couple of weeks ago. No fevers or chills. No vomiting or change in bowel habits. She notes her poor health continues to make her feel anxious. Denies CP/palp/SOB/HA/congestion/fevers or GU c/o. Taking meds as prescribed   Past Medical History:  Diagnosis Date   Abdominal pain 11/07/2016   Accelerating angina ([place]) 33/29/5188   Acute diastolic CHF (congestive heart failure) ([place]) 02/16/2019   Acute on chronic diastolic CHF (congestive heart failure) ([place]) 02/23/2019   Acute pansinusitis 04/26/2017   Amblyopia of eye, left 04/19/2018   Anemia    Anginal pain (HCC)    Anxiety    Arthritis    Asthma    Atypical chest pain 02/04/2019   Bilateral carotid bruits 03/28/2017   Blood transfusion without reported diagnosis    Breast cancer ([place])    2002, left, encapsulated, microcalcifications. lumpectomy, radtiation x 30   Breast cancer in female [facility])    Bursitis of left hip 09/04/2018   Change in bowel habits 02/22/2018   Change in mole 06/08/2016   Chest pain 03/04/2020   CHF (congestive heart failure) (HCC)    Chronic bilateral thoracic back pain 05/23/2016   Chronic diastolic congestive heart failure ([place]) 05/28/2019   Colitis    Complication of anesthesia    VERY SENSITIVE TO ANESTHESIA    Constipation 01/17/2018   Coronary artery disease involving native coronary artery of native heart without angina pectoris 05/28/2019   Cortical age-related cataract of right eye 04/18/2018   Cough variant asthma  vs UACS from ACEi     Spirometry  12/02/2016  FEV1 1.26 (76%)  Ratio 78 with min curvature p last saba > 6 h prior  - trial off acei 12/02/2016  - 12/02/2016  After extensive coaching HFA effectiveness =    75% with qvar autohaler > rechallenge with 80 2bid     Decreased visual acuity 01/16/2017   Degeneration of lumbar intervertebral disc 08/01/2018   Diarrhea 11/16/2015   D/o Diverticuli, polyps, celiac disease.     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   Left hand pain 05/07/2018   Left hip pain 03/01/2017   Left leg pain 03/01/2017   Left shoulder pain 05/07/2018   Low back pain 08/10/2015   LUQ pain 06/01/2019   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   Neck pain 03/01/2017   Nuclear sclerotic cataract of right eye  04/18/2018   Osteoporosis    Overweight (BMI 25.0-29.9) 02/14/2019   Pain in joint of left shoulder 08/11/2018   Paroxysmal atrial tachycardia (HCC) 05/28/2019   Pelvic pain 07/02/2018   Personal history of radiation therapy    Pneumonia    PONV (postoperative nausea and vomiting)    Poor appetite 02/22/2018   Pseudophakia of left eye 04/18/2018   PVD (posterior vitreous detachment), right 04/24/2019   Restless sleeper 11/07/2016   Right hip pain 10/23/2017   RLQ discomfort 03/01/2017   RLS (restless legs syndrome) 05/07/2019   S/P left THA, AA 02/13/2019   Sepsis due to pneumonia ([place]) 02/16/2019   Sleep apnea     uses CPAP   Stomach cramps    Tremor 02/15/2016   Urinary frequency 12/22/2017   Urinary incontinence 05/07/2019   Urinary tract infection 11/04/2016   Vitamin D deficiency 04/29/2016    Past Surgical History:  Procedure Laterality Date   ABDOMINAL HYSTERECTOMY     partial   APPENDECTOMY     BREAST LUMPECTOMY Left 2002   CHOLECYSTECTOMY  Age 14 or 41   COLONOSCOPY  2017   EYE SURGERY Left    cataract   LEFT HEART CATH AND CORONARY ANGIOGRAPHY N/A 02/09/2019   Procedure: LEFT HEART CATH AND CORONARY ANGIOGRAPHY;  Surgeon: Nelva Bush, MD;  Location: [facility];  Service: Cardiovascular;  Laterality: N/A;   TONSILLECTOMY     TOTAL HIP ARTHROPLASTY Left 02/13/2019   Procedure: TOTAL HIP ARTHROPLASTY ANTERIOR APPROACH;  Surgeon: Paralee Cancel, MD;  Location: [facility];  Service: Orthopedics;  Laterality: Left;  70 mins   UPPER GASTROINTESTINAL ENDOSCOPY      Family History  Problem Relation Age of Onset   Colitis Mother    Irritable bowel syndrome Mother    Hypertension Father    Hyperlipidemia Sister    Hyperlipidemia Brother    Heart attack Brother    Hyperlipidemia Brother    Heart attack Brother    Hyperlipidemia Brother    Heart attack Brother    Stomach cancer Paternal Grandmother 78   Colon cancer Paternal 48    Leukemia Daughter    Arthritis Daughter    Heart disease Daughter        ASD vs VSD   Esophageal cancer Neg Hx    Pancreatic cancer Neg Hx    Rectal cancer Neg Hx     Social History   Socioeconomic History   Marital status: Single    Spouse name: Not on file   Number of children: 3   Years of education: 18   Highest education level: Master's degree (e.g., MA, MS, MEng, MEd, MSW, MBA)  Occupational History   Occupation: retired    Comment: Pharmacist, hospital, kindergarten  Tobacco Use   Smoking status: Never   Smokeless tobacco: Never  Vaping Use   Vaping Use: Never used  Substance and Sexual Activity   Alcohol use: Never     Alcohol/week: 0.0 standard drinks   Drug use: Never   Sexual activity: Not Currently    Birth control/protection: None    Comment: lives alone, avoids daiiry and gluten. 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  Other Topics Concern   Not on file  Social History Narrative   Not on file   Social Determinants of Health   Financial Resource Strain: Low Risk    Difficulty of Paying Living Expenses: Not hard at all  Food Insecurity: No Food Insecurity   Worried About Charity fundraiser in the  Last Year: Never true   Ran Out of Food in the Last Year: Never true  Transportation Needs: No Transportation Needs   Lack of Transportation (Medical): No   Lack of Transportation (Non-Medical): No  Physical Activity: Insufficiently Active   Days of Exercise per Week: 1 day   Minutes of Exercise per Session: 30 min  Stress: No Stress Concern Present   Feeling of Stress : Not at all  Social Connections: Not on file  Intimate Partner Violence: Not At Risk   Fear of Current or Ex-Partner: No   Emotionally Abused: No   Physically Abused: No   Sexually Abused: No    Outpatient Medications Prior to Visit  Medication Sig Dispense Refill   ASPIRIN 81 PO 81 mg daily.      erythromycin ophthalmic ointment SMARTSIG:In Eye(s)     fluticasone (FLOVENT HFA) 110 MCG/ACT inhaler Inhale 1-2 puffs into the lungs 2 (two) times daily as needed. 1 each 12   furosemide (LASIX) 40 MG tablet Take 1 tablet by mouth once daily 90 tablet 1   hyoscyamine (LEVSIN SL) 0.125 MG SL tablet Place 1 tablet (0.125 mg total) under the tongue 2 (two) times daily. 30 tablet 1   levothyroxine (SYNTHROID) 50 MCG tablet TAKE 1 TABLET BY MOUTH ONCE DAILY BEFORE BREAKFAST 90 tablet 0   lisinopril (ZESTRIL) 10 MG tablet Take 1 tablet by mouth twice daily as needed 180 tablet 0   metoprolol succinate (TOPROL-XL) 25 MG 24 hr tablet TAKE 1 TABLET BY MOUTH AT BEDTIME 90 tablet 0   NON FORMULARY as needed (wheezing, SOB). Iprasynt inhaler (unable to find  english equivalent med name)     omega-3 acid ethyl esters (LOVAZA) 1 g capsule Take 2 g by mouth daily.     omeprazole (PRILOSEC) 40 MG capsule Take 1 capsule (40 mg total) by mouth in the morning and at bedtime. Take 1 tablet by mouth once to twice daily 60 capsule 5   ondansetron (ZOFRAN) 4 MG tablet Dissolve 1 tablet under tongue once to twice a day, every day 90 tablet 3   potassium chloride (KLOR-CON) 10 MEQ tablet Take 1 tablet by mouth once daily 90 tablet 0   rifaximin (XIFAXAN) 550 MG TABS tablet Take 1 tablet (550 mg total) by mouth 3 (three) times daily for 14 days. 42 tablet 0   sucralfate (CARAFATE) 1 g tablet Take 1 tablet (1 g total) by mouth 2 (two) times daily. 60 tablet 0   citalopram (CELEXA) 20 MG tablet TAKE 1 TABLET BY MOUTH AT BEDTIME 90 tablet 0   No facility-administered medications prior to visit.    Allergies  Allergen Reactions   Albuterol And Levalbuterol Palpitations    Episode of palpitations, tremor and anxiety when administered levalbuterol for PFT. Recommend avoiding this medicaiton class    Review of Systems  Constitutional:  Negative for fever and malaise/fatigue.  HENT:  Negative for congestion.   Eyes:  Negative for blurred vision.  Respiratory:  Negative for shortness of breath.   Cardiovascular:  Negative for chest pain, palpitations and leg swelling.  Gastrointestinal:  Positive for abdominal pain and nausea. Negative for blood in stool.  Genitourinary:  Negative for dysuria and frequency.  Musculoskeletal:  Negative for falls.  Skin:  Negative for rash.  Neurological:  Negative for dizziness, loss of consciousness and headaches.  Endo/Heme/Allergies:  Negative for environmental allergies.  Psychiatric/Behavioral:  Negative for depression. The patient is nervous/anxious.       Objective:  Physical Exam Constitutional:      General: She is not in acute distress.    Appearance: She is well-developed.  HENT:     Head: Normocephalic and  atraumatic.  Eyes:     Conjunctiva/sclera: Conjunctivae normal.  Neck:     Thyroid: No thyromegaly.  Cardiovascular:     Rate and Rhythm: Normal rate and regular rhythm.     Heart sounds: Normal heart sounds. No murmur heard. Pulmonary:     Effort: Pulmonary effort is normal. No respiratory distress.     Breath sounds: Normal breath sounds.  Abdominal:     General: Bowel sounds are normal. There is no distension.     Palpations: Abdomen is soft. There is no mass.     Tenderness: There is no abdominal tenderness.  Musculoskeletal:     Cervical back: Neck supple.  Lymphadenopathy:     Cervical: No cervical adenopathy.  Skin:    General: Skin is warm and dry.  Neurological:     Mental Status: She is alert and oriented to person, place, and time.  Psychiatric:        Behavior: Behavior normal.    BP 126/66    Pulse 61    Temp 98.2 F (36.8 C)    Resp 16    Wt 117 lb (53.1 kg)    SpO2 96%    BMI 24.45 kg/m  Wt Readings from Last 3 Encounters:  06/30/21 117 lb (53.1 kg)  06/29/21 117 lb (53.1 kg)  06/23/21 117 lb 3.2 oz (53.2 kg)    Diabetic Foot Exam - Simple   No data filed    Lab Results  Component Value Date   WBC 4.7 06/30/2021   HGB 11.2 (L) 06/30/2021   HCT 33.9 (L) 06/30/2021   PLT 217.0 06/30/2021   GLUCOSE 88 06/30/2021   CHOL 247 (H) 06/30/2021   TRIG 105.0 06/30/2021   HDL 64.10 06/30/2021   LDLCALC 162 (H) 06/30/2021   ALT 14 06/30/2021   AST 19 06/30/2021   NA 137 06/30/2021   K 4.3 06/30/2021   CL 102 06/30/2021   CREATININE 1.19 06/30/2021   BUN 28 (H) 06/30/2021   CO2 29 06/30/2021   TSH 2.37 06/30/2021   INR 1.2 02/16/2019   HGBA1C 6.3 06/30/2021    Lab Results  Component Value Date   TSH 2.37 06/30/2021   Lab Results  Component Value Date   WBC 4.7 06/30/2021   HGB 11.2 (L) 06/30/2021   HCT 33.9 (L) 06/30/2021   MCV 86.9 06/30/2021   PLT 217.0 06/30/2021   Lab Results  Component Value Date   NA 137 06/30/2021   K 4.3  06/30/2021   CO2 29 06/30/2021   GLUCOSE 88 06/30/2021   BUN 28 (H) 06/30/2021   CREATININE 1.19 06/30/2021   BILITOT 0.7 06/30/2021   ALKPHOS 67 06/30/2021   AST 19 06/30/2021   ALT 14 06/30/2021   PROT 6.9 06/30/2021   ALBUMIN 4.3 06/30/2021   CALCIUM 9.1 06/30/2021   ANIONGAP 10 12/23/2020   GFR 43.46 (L) 06/30/2021   Lab Results  Component Value Date   CHOL 247 (H) 06/30/2021   Lab Results  Component Value Date   HDL 64.10 06/30/2021   Lab Results  Component Value Date   LDLCALC 162 (H) 06/30/2021   Lab Results  Component Value Date   TRIG 105.0 06/30/2021   Lab Results  Component Value Date   CHOLHDL 4 06/30/2021   Lab Results  Component Value Date   HGBA1C 6.3 06/30/2021       Assessment & Plan:   Problem List Items Addressed This Visit     Essential hypertension - Primary (Chronic)   Relevant Orders   Comprehensive metabolic panel (Completed)   TSH (Completed)   Hyperlipidemia    Encourage heart healthy diet such as MIND or DASH diet, increase exercise, avoid trans fats, simple carbohydrates and processed foods, consider a krill or fish or flaxseed oil cap daily.       Relevant Orders   Lipid panel (Completed)   Osteoporosis   Hypothyroid    On Levothyroxine, continue to monitor      Irritable bowel syndrome    She continues to follow with gastroenterology and had been improving although she notes she feels a little more nauseous again the past couple of weeks. No other new symptoms. No change in therapy at this time      Diarrhea   Vitamin D deficiency    Supplement and monitor      Urinary tract infectious disease    Continue to monitor, check UA and urine culture.       Relevant Orders   Urinalysis (Completed)   Urine Culture (Completed)   Hyperglycemia    hgba1c acceptable, minimize simple carbs. Increase exercise as tolerated.       Relevant Orders   Hemoglobin A1c (Completed)   Anxiety    Cymbalta 60 mg daily.       Relevant Medications   DULoxetine (CYMBALTA) 60 MG capsule   Sun-damaged skin    Referred to dermatology for further evaluation.       Relevant Orders   Ambulatory referral to Dermatology   Other Visit Diagnoses     Iron deficiency       Relevant Orders   Iron, TIBC and Ferritin Panel (Completed)   CBC w/Diff (Completed)   Elevated sed rate       Relevant Orders   Sedimentation rate (Completed)       I have discontinued Van Clines. Andreason's citalopram. I am also having her start on DULoxetine. Additionally, I am having her maintain her omega-3 acid ethyl esters, ASPIRIN 81 PO, sucralfate, ondansetron, NON FORMULARY, erythromycin, fluticasone, metoprolol succinate, potassium chloride, lisinopril, levothyroxine, hyoscyamine, furosemide, rifaximin, and omeprazole.  Meds ordered this encounter  Medications   DULoxetine (CYMBALTA) 60 MG capsule    Sig: Take 1 capsule (60 mg total) by mouth daily.    Dispense:  30 capsule    Refill:  3     Penni Homans, MD

## 2021-07-06 NOTE — Assessment & Plan Note (Signed)
Encourage heart healthy diet such as MIND or DASH diet, increase exercise, avoid trans fats, simple carbohydrates and processed foods, consider a krill or fish or flaxseed oil cap daily.  °

## 2021-07-06 NOTE — Assessment & Plan Note (Signed)
hgba1c acceptable, minimize simple carbs. Increase exercise as tolerated.  

## 2021-07-08 LAB — PANCREATIC ELASTASE, FECAL: Pancreatic Elastase-1, Stool: 320 mcg/g

## 2021-07-14 ENCOUNTER — Ambulatory Visit: Payer: Medicare HMO | Admitting: Gastroenterology

## 2021-07-20 ENCOUNTER — Ambulatory Visit: Payer: Medicare HMO

## 2021-07-20 ENCOUNTER — Ambulatory Visit
Admission: RE | Admit: 2021-07-20 | Discharge: 2021-07-20 | Disposition: A | Discharge status: Home / Self Care | Payer: Medicare HMO | Source: Ambulatory Visit | Attending: Family Medicine | Admitting: Family Medicine

## 2021-07-20 ENCOUNTER — Other Ambulatory Visit: Payer: Self-pay

## 2021-07-20 DIAGNOSIS — R922 Inconclusive mammogram: Secondary | ICD-10-CM | POA: Diagnosis not present

## 2021-07-20 DIAGNOSIS — N644 Mastodynia: Secondary | ICD-10-CM

## 2021-07-21 DIAGNOSIS — G4733 Obstructive sleep apnea (adult) (pediatric): Secondary | ICD-10-CM | POA: Diagnosis not present

## 2021-07-27 ENCOUNTER — Ambulatory Visit (INDEPENDENT_AMBULATORY_CARE_PROVIDER_SITE_OTHER): Payer: Medicare HMO | Admitting: Medical

## 2021-07-27 VITALS — BP 124/56 | HR 57 | Temp 98.2°F | Resp 18 | Ht <= 58 in | Wt 118.0 lb

## 2021-07-27 DIAGNOSIS — R1084 Generalized abdominal pain: Secondary | ICD-10-CM | POA: Diagnosis not present

## 2021-07-27 DIAGNOSIS — R1032 Left lower quadrant pain: Secondary | ICD-10-CM

## 2021-07-27 NOTE — Patient Instructions (Addendum)
Much improved abdomen pain that was formally more diffuse.   Much better since use of  Rifaximin sample - 2 week course - '550mg'$  TID.  Also use of Cymbalta might have helped as well. ? ?Recent left lower quadrant pain and some rectal pain associated with passing small hard stools.  Since extensive work-up with GI with negative I think this current pain might be related to constipation.  Recommended getting 1 view abdomen x-ray, CBC and CMP.  You declined that presently.  Recommend hydrating well and try Colace stool softener to see if this helps with pain.  If not then let me know.  If the pain is worsening then could arrange for you to get the x-ray as well as the labs. ? ?If any dramatic worsening or changing signs symptoms recommend emergency department evaluation. ? ?You had mentioned Dr. Charlett Blake was referred you to another specialist.  If you do not respond to the above and pain worsens let me know and I would send message to PCP asking if she intended to refer you to someone else?. ? ? ?Follow up in 10 days or sooner if needed. ?

## 2021-07-27 NOTE — Progress Notes (Signed)
Subjective:    Patient ID: Julie Wilkerson, female    DOB: 1940/11/03, 81 y.o.   MRN: [id number]  HPI Here for abdomen pain follow up.  I saw pt on 06-23-2021 for this. See note. Work up was negative. Got her back in with her GI MD. Visit not in "for that day visit.    Hpi. "81 year old female here for follow-up visit with me for abdominal pain. I have seen her in the past for multiple abdominal symptoms.  She has a history of GERD, dyspepsia, hiatal hernia.   I last saw her in September 2022.  At that point time she had acute on chronic worsening of her dyspepsia and upper tract symptoms with pain.  Ultimately she proceeded with an EGD, was noted to have 5 cm hiatal hernia otherwise normal exam and biopsies negative for H. pylori.  I had placed her on FD guard for treatment of dyspepsia, she states it did help somewhat.   She states she had a severe gastroenteritis roughly 2 weeks ago.  Developed severe diarrhea for about 2 to 3 days with severe abdominal cramping.  This has improved in regards to the diarrhea, back to her usual baseline, she has some mild intermittent loose stools at baseline.  Taking Imodium helped.  She had an infectious work-up that was negative for C. difficile, bacterial infection, parasites etc.  She states for the past 3 weeks in general she has had pain in her bilateral sides, lower abdomen, and upper abdomen.  States she feels severely bloated with a lot of gas, her stomach feels hard and tender to palpation.  Having a bowel movement does not really make her feel much better.  She does find that Levsin has helped this when she takes it as well as the FD guard.  She was seen by Mackie Pai for worsening pain and she had a CT scan on January 18 which showed no acute cause for her pain.  She has had lab work otherwise that has been normal without clear cause for her symptoms.   She inquires about other options.  She takes omeprazole once to twice daily.  She is been on  Carafate and Zofran in the past.  Levsin as well.  She is taking Imodium for diarrhea as needed which has worked well for her.  Colonoscopy up-to-date, work-up as outlined below   Prior work-up: CT scan 10/24/18 - spinal stenosis lumbar spine   CT scan 12/23/2017 - diverticulosis, no cause for pain  CT scan 07/19/2017 - no cause for pain noted   Colonoscopy 08/05/2015 - AVM in the cecum noted, ablated with APC. Ileum normal. No polyps or evidence of colitis. Biopsies not taken.  EGD 08/05/2015 - 5cm HH, mild gastritis on path, mild esophagitis with esophagus with normal path, normal duodenum on path   CT scan 06/06/2019: IMPRESSION: 1. No acute findings in the abdomen or pelvis. 2. Moderate hiatal hernia with 25-50% of the stomach contained in the chest. 3. Left colonic diverticulosis without diverticulitis. 4.  Aortic Atherosclerois (ICD10-170.0)   GES 12/07/18 - normal   Colonoscopy 08/14/19 - - The examined portion of the ileum was normal. - One diminutive polyp in the cecum, removed with a cold biopsy forceps. Resected and retrieved. - Two diminutive polyps in the transverse colon, removed with a cold biopsy forceps. Resected and retrieved. - One 5 mm polyp at the splenic flexure, removed with a cold snare. Resected and retrieved. - One 6 mm polyp in  the descending colon, removed with a cold snare. Resected and retrieved. - Diverticulosis in the sigmoid colon. - Internal hemorrhoids. - The examination was otherwise normal. - Biopsies were taken with a cold forceps from the right colon, left colon and transverse colon for evaluation of microscopic colitis.   Path negative for MC     EGD 11/28/19 -  - A 5 cm hiatal hernia was present. No obvious cameron lesions noted. - The exam of the esophagus was otherwise normal. - Patchy mildly erythematous mucosa was found in the gastric antrum. - The exam of the stomach was otherwise normal. No ulcers - Biopsies were taken with a cold forceps  in the gastric body, at the incisura and in the gastric antrum for Helicobacter pylori testing. - The duodenal bulb and second portion of the duodenum were normal. Biopsies for histology were taken with a cold forceps for evaluation of celiac disease.   1. Surgical [P], duodenal bulb, 2nd portion of duodenum and distal duodenum - BENIGN DUODENAL MUCOSA. - NO FEATURES OF CELIAC SPRUE OR GRANULOMAS. 2. Surgical [P], gastric antrum and gastric body - ANTRAL AND OXYNTIC MUCOSA WITH HYPEREMIA. - WARTHIN-STARRY NEGATIVE FOR HELICOBACTER PYLORI. - NO INTESTINAL METAPLASIA, DYSPLASIA OR CARCINOMA.   CT abdomen pelvis with contrast 09/23/2020 IMPRESSION: Colonic diverticulosis. No radiographic evidence of diverticulitis or other acute findings. Small hiatal hernia.     CT angio chest / abdomen / pelvis 03/04/20 - IMPRESSION: 1. Normal contour and caliber of the thoracic and abdominal aorta. No evidence of aneurysm, dissection, or other acute aortic pathology. Scattered atherosclerosis. 2. No acute findings in the chest, abdomen or pelvis to explain pain. 3. Small hiatal hernia. 4. Sigmoid diverticulosis without evidence of diverticulitis. 5. Status post cholecystectomy and hysterectomy     CT abdomen / pelvis with contrast 09/23/20 -  IMPRESSION: Colonic diverticulosis. No radiographic evidence of diverticulitis or other acute findings. Small hiatal hernia.   CT chest angio 12/23/20: IMPRESSION: Negative for significant acute pulmonary embolus by CTA.  No other acute intrathoracic vascular finding. Diffuse bilateral patchy ground-glass opacities, nonspecific. See above comment. Moderate hiatal hernia   FOBT negative 09/23/20     EGD 02/11/21 -  - A 5 cm hiatal hernia was present. - The exam of the esophagus was otherwise normal. - The entire examined stomach was normal. Biopsies were taken with a cold forceps for Helicobacter pylori testing. - The duodenal bulb and second portion  of the duodenum were normal.   Surgical [P], gastric antrum and gastric body - BENIGN GASTRIC MUCOSA. [name] IS NEGATIVE FOR HELICOBACTER PYLORI. - NO INTESTINAL METAPLASIA, DYSPLASIA, OR MALIGNANCY.   CT scan 06/10/2021: IMPRESSION: 1. No acute process identified. 2. Small to moderate sized hiatal hernia. 3. Mild colonic diverticulosis. Retained fecal material throughout the colon"  A/P below from gi office visit.  "As above patient has chronic dyspepsia with a variety of abdominal pains that have been ongoing for years with an extensive evaluation with multiple imaging modalities, endoscopy, and colonoscopy.  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  I do think she probably had a acute viral gastroenteritis in recent weeks, several family members have been sick including herself which seems to have made her symptoms worse.  Tested negative for infection.  She has had some persistent abdominal pain for a few weeks now, significant amount of gas and bloating that bothers her.   We discussed option.  I reassured her that  her blood work and CT scans look okay I do not see anything bad.  Her EGD and colonoscopy are up-to-date.  She has some baseline loose stools at times, I will screen her for fecal pancreatic insufficiency with a stool test but her pancreas looks okay on imaging.  With all of the gas/bloating she is experiencing, I think she may benefit from a course of rifaximin which may ease her abdominal discomfort.  I gave her a 2-week free sample that we had in the office today as this will be very expensive for her to get in the pharmacy, she was very grateful for this.  See how she responds to rifaximin.  Longer-term, she has a lot of chronic pain/discomfort in her abdomen, I wonder if she may benefit from a switch of her Celexa to Cymbalta.  I will touch base with her primary care team about this. She agreed  with the plan.   Plan: - stool for fecal pancreatic elastase - provided free Rifaximin sample - 2 week course - '550mg'$  TID for ongoing bloating / abdominal discomfort  - consider switch from SSRI to cymbalta in the setting of chronic pain / bloating, will discuss with primary care team"  Dr. Charlett Blake has seen pt and noted   She continues to follow with gastroenterology and had been improving although she notes she feels a little more nauseous again the past couple of weeks. No other new symptoms. No change in therapy at this time   Pt has anxiety and Dr. Charlett Blake rx cymbalta.  Which was suggested as tx option per GI.  Pt tells me that she feel a lot better now after using rifaximin. She states after 3 days started to feel better. Not feeling pain or bloated.   Pt still having some lower abdomen pain that radiates to her back. Pt states with lower abdomen pain no current urinary symptoms. Last visit ua and urine culture was negative. She has pain in left lower quadrant pain when has bowel movement. Also has pain in rectum. Will note harder stools. Pt mentions Dr. Charlett Blake was considering referal to other specialist but I don't see that in epic.     Review of Systems  Constitutional:  Negative for chills, fatigue and fever.  Respiratory:  Negative for cough, chest tightness, shortness of breath and wheezing.   Cardiovascular:  Negative for chest pain and palpitations.  Gastrointestinal:  Negative for abdominal pain, constipation, diarrhea and nausea.  Genitourinary:  Negative for dysuria, flank pain, frequency, hematuria and urgency.  Musculoskeletal:  Negative for back pain, gait problem and joint swelling.  Skin:  Negative for rash.    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 lumpectomy, radtiation x 30   Breast cancer in female [facility])    Bursitis of left hip 09/04/2018   Change in bowel habits 02/22/2018   Change in mole 06/08/2016   Chest pain 03/04/2020   CHF (  congestive heart failure) (HCC)    Chronic bilateral thoracic back pain 05/23/2016   Chronic diastolic congestive heart failure ([place]) 05/28/2019   Colitis    Complication of anesthesia    VERY SENSITIVE TO ANESTHESIA    Constipation 01/17/2018   Coronary artery disease involving native coronary artery of native heart without angina pectoris 05/28/2019   Cortical age-related cataract of right eye 04/18/2018   Cough variant asthma  vs UACS from ACEi     Spirometry 12/02/2016  FEV1 1.26 (76%)  Ratio 78 with min curvature p last saba > 6 h prior  - trial off acei 12/02/2016  - 12/02/2016  After extensive coaching HFA effectiveness =    75% with qvar autohaler > rechallenge with 80 2bid     Decreased visual acuity 01/16/2017   Degeneration of lumbar intervertebral disc 08/01/2018   Diarrhea 11/16/2015   D/o Diverticuli, polyps, celiac disease.     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   Left hand pain 05/07/2018   Left hip pain 03/01/2017   Left leg pain 03/01/2017   Left shoulder pain 05/07/2018   Low back pain 08/10/2015   LUQ pain 06/01/2019   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   Neck pain 03/01/2017   Nuclear sclerotic cataract of right eye 04/18/2018   Osteoporosis    Overweight (BMI 25.0-29.9) 02/14/2019   Pain in joint of left shoulder 08/11/2018   Paroxysmal atrial tachycardia ([place]) 05/28/2019   Pelvic pain 07/02/2018   Personal history of radiation therapy    Pneumonia    PONV (postoperative nausea and vomiting)    Poor appetite 02/22/2018   Pseudophakia of left eye 04/18/2018   PVD (posterior vitreous detachment), right 04/24/2019   Restless sleeper 11/07/2016   Right hip pain 10/23/2017   RLQ discomfort 03/01/2017   RLS (restless legs syndrome) 05/07/2019   S/P left THA, AA 02/13/2019   Sepsis due to pneumonia ([place]) 02/16/2019   Sleep apnea    uses CPAP   Stomach cramps    Tremor 02/15/2016   Urinary frequency 12/22/2017   Urinary incontinence 05/07/2019   Urinary tract infection 11/04/2016   Vitamin D deficiency 04/29/2016     Social History   Socioeconomic History   Marital status: Single    Spouse name: Not on file   Number of children: 3   Years of education: 18   Highest education level: Master's degree (e.g., MA, MS, MEng, MEd, MSW, MBA)  Occupational History   Occupation: retired    Comment: Pharmacist, hospital, kindergarten  Tobacco Use   Smoking status: Never   Smokeless tobacco: Never  Vaping Use   Vaping Use: Never used  Substance and Sexual Activity   Alcohol use: Never    Alcohol/week: 0.0 standard drinks   Drug use: Never   Sexual activity: Not Currently    Birth control/protection: None  Comment: lives alone, avoids daiiry and gluten. 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  Other Topics Concern   Not on file  Social History Narrative   Not on file    Social Determinants of Health   Financial Resource Strain: Low Risk    Difficulty of Paying Living Expenses: Not hard at all  Food Insecurity: No Food Insecurity   Worried About Charity fundraiser in the Last Year: Never true   [place] in the Last Year: Never true  Transportation Needs: No Transportation Needs   Lack of Transportation (Medical): No   Lack of Transportation (Non-Medical): No  Physical Activity: Insufficiently Active   Days of Exercise per Week: 1 day   Minutes of Exercise per Session: 30 min  Stress: No Stress Concern Present   Feeling of Stress : Not at all  Social Connections: Not on file  Intimate Partner Violence: Not At Risk   Fear of Current or Ex-Partner: No   Emotionally Abused: No   Physically Abused: No   Sexually Abused: No    Past Surgical History:  Procedure Laterality Date   ABDOMINAL HYSTERECTOMY     partial   APPENDECTOMY     BREAST LUMPECTOMY Left 2002   CHOLECYSTECTOMY  Age 67 or 59   COLONOSCOPY  2017   EYE SURGERY Left    cataract   LEFT HEART CATH AND CORONARY ANGIOGRAPHY N/A 02/09/2019   Procedure: LEFT HEART CATH AND CORONARY ANGIOGRAPHY;  Surgeon: Nelva Bush, MD;  Location: [facility];  Service: Cardiovascular;  Laterality: N/A;   TONSILLECTOMY     TOTAL HIP ARTHROPLASTY Left 02/13/2019   Procedure: TOTAL HIP ARTHROPLASTY ANTERIOR APPROACH;  Surgeon: Paralee Cancel, MD;  Location: [facility];  Service: Orthopedics;  Laterality: Left;  70 mins   UPPER GASTROINTESTINAL ENDOSCOPY      Family History  Problem Relation Age of Onset   Colitis Mother    Irritable bowel syndrome Mother    Hypertension Father    Hyperlipidemia Sister    Hyperlipidemia Brother    Heart attack Brother    Hyperlipidemia Brother    Heart attack Brother    Hyperlipidemia Brother    Heart attack Brother    Stomach cancer Paternal Grandmother 55   Colon cancer Paternal Grandmother    Leukemia Daughter    Arthritis Daughter    Heart  disease Daughter        ASD vs VSD   Esophageal cancer Neg Hx    Pancreatic cancer Neg Hx    Rectal cancer Neg Hx     Allergies  Allergen Reactions   Albuterol And Levalbuterol Palpitations    Episode of palpitations, tremor and anxiety when administered levalbuterol for PFT. Recommend avoiding this medicaiton class    Current Outpatient Medications on File Prior to Visit  Medication Sig Dispense Refill   ASPIRIN 81 PO 81 mg daily.      DULoxetine (CYMBALTA) 60 MG capsule Take 1 capsule (60 mg total) by mouth daily. 30 capsule 3   erythromycin ophthalmic ointment SMARTSIG:In Eye(s)     fluticasone (FLOVENT HFA) 110 MCG/ACT inhaler Inhale 1-2 puffs into the lungs 2 (two) times daily as needed. 1 each 12   furosemide (LASIX) 40 MG tablet Take 1 tablet by mouth once daily 90 tablet 1   hyoscyamine (LEVSIN SL) 0.125 MG SL tablet Place 1 tablet (0.125 mg total) under the tongue 2 (two) times daily. 30 tablet 1   levothyroxine (SYNTHROID) 50 MCG tablet TAKE  1 TABLET BY MOUTH ONCE DAILY BEFORE BREAKFAST 90 tablet 0   lisinopril (ZESTRIL) 10 MG tablet Take 1 tablet by mouth twice daily as needed 180 tablet 0   metoprolol succinate (TOPROL-XL) 25 MG 24 hr tablet TAKE 1 TABLET BY MOUTH AT BEDTIME 90 tablet 0   NON FORMULARY as needed (wheezing, SOB). Iprasynt inhaler (unable to find english equivalent med name)     omega-3 acid ethyl esters (LOVAZA) 1 g capsule Take 2 g by mouth daily.     omeprazole (PRILOSEC) 40 MG capsule Take 1 capsule (40 mg total) by mouth in the morning and at bedtime. Take 1 tablet by mouth once to twice daily 60 capsule 5   ondansetron (ZOFRAN) 4 MG tablet Dissolve 1 tablet under tongue once to twice a day, every day 90 tablet 3   potassium chloride (KLOR-CON) 10 MEQ tablet Take 1 tablet by mouth once daily 90 tablet 0   sucralfate (CARAFATE) 1 g tablet Take 1 tablet (1 g total) by mouth 2 (two) times daily. 60 tablet 0   No current facility-administered medications on  file prior to visit.    BP (!) 124/56    Pulse (!) 57    Temp 98.2 F (36.8 C)    Resp 18    Ht '4\' 10"'$  (1.473 m)    Wt 118 lb (53.5 kg)    SpO2 96%    BMI 24.66 kg/m       Objective:   Physical Exam  General- No acute distress. Pleasant patient. Neck- Full range of motion, no jvd Lungs- Clear, even and unlabored. Heart- regular rate and rhythm. Neurologic- CNII- XII grossly intact.   Abdomen-soft, nondistended, positive bowel sounds, no rebound or guarding but does have some faint left lower quadrant tenderness to palpation.      Assessment & Plan:   Patient Instructions  Much improved abdomen pain that was formally more diffuse.   Much better since use of  Rifaximin sample - 2 week course - '550mg'$  TID.  Also use of Cymbalta might have helped as well.  Recent left lower quadrant pain and some rectal pain associated with passing small hard stools.  Since extensive work-up with GI with negative I think this current pain might be related to constipation.  Recommended getting 1 view abdomen x-ray, CBC and CMP.  You declined that presently.  Recommend hydrating well and try Colace stool softener to see if this helps with pain.  If not then let me know.  If the pain is worsening then could arrange for you to get the x-ray as well as the labs.  If any dramatic worsening or changing signs symptoms recommend emergency department evaluation.  You had mentioned Dr. Charlett Blake was referred you to another specialist.  If you do not respond to the above and pain worsens let me know and I would send message to PCP asking if she intended to refer you to someone else?Marland Kitchen   Follow up in 10 days or sooner if needed.  Mackie Pai, PA-C

## 2021-07-30 ENCOUNTER — Other Ambulatory Visit: Payer: Medicare HMO

## 2021-07-31 ENCOUNTER — Other Ambulatory Visit: Payer: Self-pay

## 2021-07-31 ENCOUNTER — Other Ambulatory Visit: Payer: Self-pay | Admitting: Family Medicine

## 2021-07-31 ENCOUNTER — Encounter: Payer: Self-pay | Admitting: Internal Medicine

## 2021-07-31 ENCOUNTER — Ambulatory Visit (INDEPENDENT_AMBULATORY_CARE_PROVIDER_SITE_OTHER): Payer: Medicare HMO | Admitting: Internal Medicine

## 2021-07-31 VITALS — BP 120/62 | HR 51 | Ht <= 58 in | Wt 118.0 lb

## 2021-07-31 DIAGNOSIS — R0789 Other chest pain: Secondary | ICD-10-CM | POA: Diagnosis not present

## 2021-07-31 NOTE — Patient Instructions (Signed)
Medication Instructions:  ?No Changes In Medications at this time.  ?*If you need a refill on your cardiac medications before your next appointment, please call your pharmacy* ? ?Follow-Up: ?At [facility], you and your health needs are our priority.  As part of our continuing mission to provide you with exceptional heart care, we have created designated Provider Care Teams.  These Care Teams include your primary Cardiologist (physician) and Advanced Practice Providers (APPs -  Physician Assistants and Nurse Practitioners) who all work together to provide you with the care you need, when you need it. ? ?Your next appointment:   ?AS NEEDED  ? ?The format for your next appointment:   ?In Person ? ?Provider:   ?Branch, Mary E, MD   ?

## 2021-07-31 NOTE — Progress Notes (Signed)
Cardiology Office Note:    Date:  07/31/2021   ID:  Julie Wilkerson, DOB 1940-12-19, MRN [id number]  PCP:  [name], MD   [place] Providers Cardiologist:  Berniece Salines, DO     Referring MD: [name], MD   No chief complaint on file. Chest pain  History of Present Illness:   Initial Visit Julie Wilkerson is a 81 y.o. female with a hx of HTN, non obstructive CAD, central sleep apnea, hypothyroidism,  comes in today for chest pain in her chest and side.Her left arm gets numb. This has been going on for 1 year. Sometimes it is two days in a row, then it stops.It lasts for seconds to minutes. She rubs it and moves her arm. That improves it. He was getting speciality messages that helped with the pain. During the day she does house cleaning , walks in the neighborhood. She has back pain. She noted LH,dizzy sometimes with headaches. No dyspnea on exertion. She can walk up a flight of stairs fine. Her BP at home 120s. No orthopnea, PND, no LE edema. She cannot sleep with her CPAP.  Cardiac Hx: She underwent cath 02/09/2019 prior to hip arthroplasty procedure. She had mild-moderate non obstructive dx. No evidence of myocardial bridge. Normal filling pressures. Saw Dr. Claiborne Billings for a sleep study who recommended CPAP.    Interim Hx: Her pain has not progressed. But it has not resolved. Her house cleaning is going well. NO symptoms with activity  Cardiology Studies:  TTE 01/13/21 Normal LV fxn Normal RV fxn No valve dx Normal IVC  Cardiac Monitor: palpitations 04/03/2019 9 SVTlongest 13 beats. No atrial fibrillation, no block. No VT.  SPECT 01/18/2017 -Normal  03/29/2017 BL carotid US negative  Past Medical History:  Diagnosis Date   Abdominal pain 11/07/2016   Accelerating angina ([place]) 96/22/2979   Acute diastolic CHF (congestive heart failure) ([place]) 02/16/2019   Acute on chronic diastolic CHF (congestive heart failure) ([place]) 02/23/2019   Acute pansinusitis 04/26/2017    Amblyopia of eye, left 04/19/2018   Anemia    Anginal pain (HCC)    Anxiety    Arthritis    Asthma    Atypical chest pain 02/04/2019   Bilateral carotid bruits 03/28/2017   Blood transfusion without reported diagnosis    Breast cancer ([place])    2002, left, encapsulated, microcalcifications. lumpectomy, radtiation x 30   Breast cancer in female [facility])    Bursitis of left hip 09/04/2018   Change in bowel habits 02/22/2018   Change in mole 06/08/2016   Chest pain 03/04/2020   CHF (congestive heart failure) (HCC)    Chronic bilateral thoracic back pain 05/23/2016   Chronic diastolic congestive heart failure ([place]) 05/28/2019   Colitis    Complication of anesthesia    VERY SENSITIVE TO ANESTHESIA    Constipation 01/17/2018   Coronary artery disease involving native coronary artery of native heart without angina pectoris 05/28/2019   Cortical age-related cataract of right eye 04/18/2018   Cough variant asthma  vs UACS from ACEi     Spirometry 12/02/2016  FEV1 1.26 (76%)  Ratio 78 with min curvature p last saba > 6 h prior  - trial off acei 12/02/2016  - 12/02/2016  After extensive coaching HFA effectiveness =    75% with qvar autohaler > rechallenge with 80 2bid     Decreased visual acuity 01/16/2017   Degeneration of lumbar intervertebral disc 08/01/2018   Diarrhea 11/16/2015   D/o  Diverticuli, polyps, celiac disease.     Dyspnea 11/04/2016   with asthma exacerbation only   Dysuria 11/04/2016   Elevated sed rate 10/23/2017   Essential hypertension    Trial off acei 12/02/2016 due to cough/ pseudoasthma   GERD (gastroesophageal reflux disease)    Gluten intolerance    History of chicken pox    History of Helicobacter pylori infection 08/01/2015   History of rectal bleeding 06/01/2019   Hx of LASIK 04/24/2019   Hyperglycemia 11/07/2016   Hyperlipidemia    Hypertension    Hyponatremia 02/16/2019   Hypothyroidism    IBS (irritable bowel syndrome)    Lattice degeneration of right  retina 04/24/2019   Left hand pain 05/07/2018   Left hip pain 03/01/2017   Left leg pain 03/01/2017   Left shoulder pain 05/07/2018   Low back pain 08/10/2015   LUQ pain 06/01/2019   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   Neck pain 03/01/2017   Nuclear sclerotic cataract of right eye 04/18/2018   Osteoporosis    Overweight (BMI 25.0-29.9) 02/14/2019   Pain in joint of left shoulder 08/11/2018   Paroxysmal atrial tachycardia ([place]) 05/28/2019   Pelvic pain 07/02/2018   Personal history of radiation therapy    Pneumonia    PONV (postoperative nausea and vomiting)    Poor appetite 02/22/2018   Pseudophakia of left eye 04/18/2018   PVD (posterior vitreous detachment), right 04/24/2019   Restless sleeper 11/07/2016   Right hip pain 10/23/2017   RLQ discomfort 03/01/2017   RLS (restless legs syndrome) 05/07/2019   S/P left THA, AA 02/13/2019   Sepsis due to pneumonia ([place]) 02/16/2019   Sleep apnea    uses CPAP   Stomach cramps    Tremor 02/15/2016   Urinary frequency 12/22/2017   Urinary incontinence 05/07/2019   Urinary tract infection 11/04/2016   Vitamin D deficiency 04/29/2016    Past Surgical History:  Procedure Laterality Date   ABDOMINAL HYSTERECTOMY     partial   APPENDECTOMY     BREAST LUMPECTOMY Left 2002   CHOLECYSTECTOMY  Age 23 or 80   COLONOSCOPY  2017   EYE SURGERY Left    cataract   LEFT HEART CATH AND CORONARY ANGIOGRAPHY N/A 02/09/2019   Procedure: LEFT HEART CATH AND CORONARY ANGIOGRAPHY;  Surgeon: Nelva Bush, MD;  Location: [place] [facility];  Service: Cardiovascular;  Laterality: N/A;   TONSILLECTOMY     TOTAL HIP ARTHROPLASTY Left 02/13/2019   Procedure: TOTAL HIP ARTHROPLASTY ANTERIOR APPROACH;  Surgeon: Paralee Cancel, MD;  Location: [facility];  Service: Orthopedics;  Laterality: Left;  70 mins   UPPER GASTROINTESTINAL ENDOSCOPY      Current Medications: Current Meds  Medication Sig   ASPIRIN 81  PO 81 mg daily.    fluticasone (FLOVENT HFA) 110 MCG/ACT inhaler Inhale 1-2 puffs into the lungs 2 (two) times daily as needed.   furosemide (LASIX) 40 MG tablet Take 1 tablet by mouth once daily   hyoscyamine (LEVSIN SL) 0.125 MG SL tablet Place 1 tablet (0.125 mg total) under the tongue 2 (two) times daily.   levothyroxine (SYNTHROID) 50 MCG tablet TAKE 1 TABLET BY MOUTH ONCE DAILY BEFORE BREAKFAST   lisinopril (ZESTRIL) 10 MG tablet Take 1 tablet by mouth twice daily as needed   metoprolol succinate (TOPROL-XL) 25 MG 24 hr tablet TAKE 1 TABLET BY MOUTH AT BEDTIME   omega-3 acid ethyl esters (LOVAZA) 1 g capsule Take 2 g by mouth  daily.   omeprazole (PRILOSEC) 40 MG capsule Take 1 capsule (40 mg total) by mouth in the morning and at bedtime. Take 1 tablet by mouth once to twice daily   ondansetron (ZOFRAN) 4 MG tablet Dissolve 1 tablet under tongue once to twice a day, every day   potassium chloride (KLOR-CON) 10 MEQ tablet Take 1 tablet by mouth once daily   sucralfate (CARAFATE) 1 g tablet Take 1 tablet (1 g total) by mouth 2 (two) times daily.     Allergies:   Albuterol and levalbuterol   Social History   Socioeconomic History   Marital status: Single    Spouse name: Not on file   Number of children: 3   Years of education: 17   Highest education level: Master's degree (e.g., MA, MS, MEng, MEd, MSW, MBA)  Occupational History   Occupation: retired    Comment: Pharmacist, hospital, kindergarten  Tobacco Use   Smoking status: Never   Smokeless tobacco: Never  Vaping Use   Vaping Use: Never used  Substance and Sexual Activity   Alcohol use: Never    Alcohol/week: 0.0 standard drinks   Drug use: Never   Sexual activity: Not Currently    Birth control/protection: None    Comment: lives alone, avoids daiiry and gluten. 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  Other Topics Concern   Not on file  Social History Narrative   Not on file   Social Determinants of Health   Financial Resource Strain: Low  Risk    Difficulty of Paying Living Expenses: Not hard at all  Food Insecurity: No Food Insecurity   Worried About Charity fundraiser in the Last Year: Never true   [place] in the Last Year: Never true  Transportation Needs: No Transportation Needs   Lack of Transportation (Medical): No   Lack of Transportation (Non-Medical): No  Physical Activity: Insufficiently Active   Days of Exercise per Week: 1 day   Minutes of Exercise per Session: 30 min  Stress: No Stress Concern Present   Feeling of Stress : Not at all  Social Connections: Not on file     Family History: The patient's family history includes Arthritis in her daughter; Colitis in her mother; Colon cancer in her paternal grandmother; Heart attack in her brother, brother, and brother; Heart disease in her daughter; Hyperlipidemia in her brother, brother, brother, and sister; Hypertension in her father; Irritable bowel syndrome in her mother; Leukemia in her daughter; Stomach cancer (age of onset: 17) in her paternal grandmother. There is no history of Esophageal cancer, Pancreatic cancer, or Rectal cancer.  ROS:   Please see the history of present illness.     All other systems reviewed and are negative.  EKGs/Labs/Other Studies Reviewed:    The following studies were reviewed today:   EKG:  EKG is  ordered today.  The ekg ordered today demonstrates   Sinus bradycardia,non specific ST-T changes  Sinus bradycardia   Recent Labs: 12/23/2020: B Natriuretic Peptide 190.5 06/23/2021: Pro B Natriuretic peptide (BNP) 159.0 06/30/2021: ALT 14; BUN 28; Creatinine, Ser 1.19; Hemoglobin 11.2; Platelets 217.0; Potassium 4.3; Sodium 137; TSH 2.37   Recent Lipid Panel    Component Value Date/Time   CHOL 247 (H) 06/30/2021 1215   TRIG 105.0 06/30/2021 1215   HDL 64.10 06/30/2021 1215   CHOLHDL 4 06/30/2021 1215   VLDL 21.0 06/30/2021 1215   LDLCALC 162 (H) 06/30/2021 1215   LDLCALC 79 02/01/2020 1020     Risk  Assessment/Calculations:           Physical Exam:    VS:   Vitals:   07/31/21 1123  BP: 120/62  Pulse: (!) 51  SpO2: 96%     Wt Readings from Last 3 Encounters:  07/31/21 118 lb (53.5 kg)  07/27/21 118 lb (53.5 kg)  06/30/21 117 lb (53.1 kg)     GEN:  Well nourished, well developed in no acute distress HEENT: Normal NECK: No JVD; No carotid bruits CARDIAC: RRR, no murmurs, rubs, gallops RESPIRATORY:  Clear to auscultation without rales, wheezing or rhonchi  ABDOMEN: Soft, non-tender, non-distended MUSCULOSKELETAL:  No edema; No deformity  SKIN: Warm and dry NEUROLOGIC:  Alert and oriented x 3 PSYCHIATRIC:  Normal affect   ASSESSMENT:    #Atypical CP: Her pain has not progressed. Felt MSK on her initial visit. We discussed that if she has progressive chest pressure or shortness of breath limiting her activity to get medical attention and let us know. Otherwise will plan to have her follow up as needed  PLAN:    In order of problems listed above:  Follow up prn   Medication Adjustments/Labs and Tests Ordered: Current medicines are reviewed at length with the patient today.  Concerns regarding medicines are outlined above.    Signed, Janina Mayo, MD  07/31/2021 11:33 AM

## 2021-07-31 NOTE — Progress Notes (Deleted)
Cardiology Office Note:    Date:  07/31/2021   ID:  Julie Wilkerson, DOB 04-29-1941, MRN [id number]  PCP:  [name], MD   [facility] Providers Cardiologist:  Berniece Salines, DO { Click to update primary MD,subspecialty MD or APP then REFRESH:1}    Referring MD: [name], MD   No chief complaint on file. ***  History of Present Illness:    Julie Wilkerson is a 81 y.o. female with a hx of ***     Cardiology Studies: TTE- EF 60-65%,  no significant valve dx  Past Medical History:  Diagnosis Date   Abdominal pain 11/07/2016   Accelerating angina ([place]) 82/50/5397   Acute diastolic CHF (congestive heart failure) ([place]) 02/16/2019   Acute on chronic diastolic CHF (congestive heart failure) ([place]) 02/23/2019   Acute pansinusitis 04/26/2017   Amblyopia of eye, left 04/19/2018   Anemia    Anginal pain (HCC)    Anxiety    Arthritis    Asthma    Atypical chest pain 02/04/2019   Bilateral carotid bruits 03/28/2017   Blood transfusion without reported diagnosis    Breast cancer ([place])    2002, left, encapsulated, microcalcifications. lumpectomy, radtiation x 30   Breast cancer in female [facility])    Bursitis of left hip 09/04/2018   Change in bowel habits 02/22/2018   Change in mole 06/08/2016   Chest pain 03/04/2020   CHF (congestive heart failure) (HCC)    Chronic bilateral thoracic back pain 05/23/2016   Chronic diastolic congestive heart failure ([place]) 05/28/2019   Colitis    Complication of anesthesia    VERY SENSITIVE TO ANESTHESIA    Constipation 01/17/2018   Coronary artery disease involving native coronary artery of native heart without angina pectoris 05/28/2019   Cortical age-related cataract of right eye 04/18/2018   Cough variant asthma  vs UACS from ACEi     Spirometry 12/02/2016  FEV1 1.26 (76%)  Ratio 78 with min curvature p last saba > 6 h prior  - trial off acei 12/02/2016  - 12/02/2016  After extensive coaching HFA effectiveness =    75% with qvar  autohaler > rechallenge with 80 2bid     Decreased visual acuity 01/16/2017   Degeneration of lumbar intervertebral disc 08/01/2018   Diarrhea 11/16/2015   D/o Diverticuli, polyps, celiac disease.     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   Left hand pain 05/07/2018   Left hip pain 03/01/2017   Left leg pain 03/01/2017   Left shoulder pain 05/07/2018   Low back pain 08/10/2015   LUQ pain 06/01/2019   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   Neck pain 03/01/2017   Nuclear sclerotic cataract of right eye 04/18/2018   Osteoporosis    Overweight (BMI 25.0-29.9) 02/14/2019   Pain in joint of left shoulder 08/11/2018   Paroxysmal atrial tachycardia (  [place]) 05/28/2019   Pelvic pain 07/02/2018   Personal history of radiation therapy    Pneumonia    PONV (postoperative nausea and vomiting)    Poor appetite 02/22/2018   Pseudophakia of left eye 04/18/2018   PVD (posterior vitreous detachment), right 04/24/2019   Restless sleeper 11/07/2016   Right hip pain 10/23/2017   RLQ discomfort 03/01/2017   RLS (restless legs syndrome) 05/07/2019   S/P left THA, AA 02/13/2019   Sepsis due to pneumonia ([place]) 02/16/2019   Sleep apnea    uses CPAP   Stomach cramps    Tremor 02/15/2016   Urinary frequency 12/22/2017   Urinary incontinence 05/07/2019   Urinary tract infection 11/04/2016   Vitamin D deficiency  04/29/2016    Past Surgical History:  Procedure Laterality Date   ABDOMINAL HYSTERECTOMY     partial   APPENDECTOMY     BREAST LUMPECTOMY Left 2002   CHOLECYSTECTOMY  Age 26 or 69   COLONOSCOPY  2017   EYE SURGERY Left    cataract   LEFT HEART CATH AND CORONARY ANGIOGRAPHY N/A 02/09/2019   Procedure: LEFT HEART CATH AND CORONARY ANGIOGRAPHY;  Surgeon: Nelva Bush, MD;  Location: [place] [facility];  Service: Cardiovascular;  Laterality: N/A;   TONSILLECTOMY     TOTAL HIP ARTHROPLASTY Left 02/13/2019   Procedure: TOTAL HIP ARTHROPLASTY ANTERIOR APPROACH;  Surgeon: Paralee Cancel, MD;  Location: [facility];  Service: Orthopedics;  Laterality: Left;  70 mins   UPPER GASTROINTESTINAL ENDOSCOPY      Current Medications: No outpatient medications have been marked as taking for the 07/31/21 encounter (Appointment) with Julie Mayo, MD.     Allergies:   Albuterol and levalbuterol   Social History   Socioeconomic History   Marital status: Single    Spouse name: Not on file   Number of children: 3   Years of education: 74   Highest education level: Master's degree (e.g., MA, MS, MEng, MEd, MSW, MBA)  Occupational History   Occupation: retired    Comment: Pharmacist, hospital, kindergarten  Tobacco Use   Smoking status: Never   Smokeless tobacco: Never  Vaping Use   Vaping Use: Never used  Substance and Sexual Activity   Alcohol use: Never    Alcohol/week: 0.0 standard drinks   Drug use: Never   Sexual activity: Not Currently    Birth control/protection: None    Comment: lives alone, avoids daiiry and gluten. 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  Other Topics Concern   Not on file  Social History Narrative   Not on file   Social Determinants of Health   Financial Resource Strain: Low Risk    Difficulty of Paying Living Expenses: Not hard at all  Food Insecurity: No Food Insecurity   Worried About Charity fundraiser in the Last Year: Never true   [place] in the Last Year: Never  true  Transportation Needs: No Transportation Needs   Lack of Transportation (Medical): No   Lack of Transportation (Non-Medical): No  Physical Activity: Insufficiently Active   Days of Exercise per Week: 1 day   Minutes of Exercise per Session: 30 min  Stress: No Stress Concern Present   Feeling of Stress : Not at all  Social Connections: Not on file     Family History: The patient's ***family history includes Arthritis in her daughter; Colitis in her mother; Colon cancer in her paternal grandmother; Heart attack in her brother, brother, and brother; Heart disease in her daughter; Hyperlipidemia  in her brother, brother, brother, and sister; Hypertension in her father; Irritable bowel syndrome in her mother; Leukemia in her daughter; Stomach cancer (age of onset: 74) in her paternal grandmother. There is no history of Esophageal cancer, Pancreatic cancer, or Rectal cancer.  ROS:   Please see the history of present illness.    *** All other systems reviewed and are negative.  EKGs/Labs/Other Studies Reviewed:    The following studies were reviewed today: ***  EKG:  EKG is *** ordered today.  The ekg ordered today demonstrates ***  Recent Labs: 12/23/2020: B Natriuretic Peptide 190.5 06/23/2021: Pro B Natriuretic peptide (BNP) 159.0 06/30/2021: ALT 14; BUN 28; Creatinine, Ser 1.19; Hemoglobin 11.2; Platelets 217.0; Potassium 4.3; Sodium 137; TSH 2.37  Recent Lipid Panel    Component Value Date/Time   CHOL 247 (H) 06/30/2021 1215   TRIG 105.0 06/30/2021 1215   HDL 64.10 06/30/2021 1215   CHOLHDL 4 06/30/2021 1215   VLDL 21.0 06/30/2021 1215   LDLCALC 162 (H) 06/30/2021 1215   LDLCALC 79 02/01/2020 1020     Risk Assessment/Calculations:   {Does this patient have ATRIAL FIBRILLATION?:[phone]}       Physical Exam:    VS:  There were no vitals taken for this visit.    Wt Readings from Last 3 Encounters:  07/27/21 118 lb (53.5 kg)  06/30/21 117 lb (53.1 kg)  06/29/21 117  lb (53.1 kg)     GEN: *** Well nourished, well developed in no acute distress HEENT: Normal NECK: No JVD; No carotid bruits LYMPHATICS: No lymphadenopathy CARDIAC: ***RRR, no murmurs, rubs, gallops RESPIRATORY:  Clear to auscultation without rales, wheezing or rhonchi  ABDOMEN: Soft, non-tender, non-distended MUSCULOSKELETAL:  No edema; No deformity  SKIN: Warm and dry NEUROLOGIC:  Alert and oriented x 3 PSYCHIATRIC:  Normal affect   ASSESSMENT:    No diagnosis found. PLAN:    In order of problems listed above:  ***      {Are you ordering a CV Procedure (e.g. stress test, cath, DCCV, TEE, etc)?   Press F2        :[id number]}    Medication Adjustments/Labs and Tests Ordered: Current medicines are reviewed at length with the patient today.  Concerns regarding medicines are outlined above.  No orders of the defined types were placed in this encounter.  No orders of the defined types were placed in this encounter.   There are no Patient Instructions on file for this visit.   Signed, Julie Mayo, MD  07/31/2021 9:06 AM    [place]

## 2021-08-07 ENCOUNTER — Telehealth: Payer: Self-pay | Admitting: Pulmonary Disease

## 2021-08-07 NOTE — Telephone Encounter (Signed)
I received a message from [place] about the high res CT scan that was ordered back in October 2022. Per the office note, the CT was supposed to have been done before her f/u in November 2022 but wasn't. The note from November 2022 does not mention the need for a CT. She is not due for an appt until November 2023.  ? ?JD, can you please advise if you still want a CT scan on her? Thanks!  ? ? ?

## 2021-08-11 NOTE — Telephone Encounter (Signed)
I called pt to tell her JD said she did not need CT at this time.  She states she is having pains and is having to use her inhaler and did not call into office because she knew she would be getting CT tomorrow.  I sent message to [facility] and she states to keep CT appt.  I called pt & made her aware she can go for CT tomorrow.  Nothing further needed. ?

## 2021-08-12 ENCOUNTER — Other Ambulatory Visit: Payer: Self-pay

## 2021-08-12 ENCOUNTER — Ambulatory Visit (INDEPENDENT_AMBULATORY_CARE_PROVIDER_SITE_OTHER)
Admission: RE | Admit: 2021-08-12 | Discharge: 2021-08-12 | Disposition: A | Discharge status: Home / Self Care | Payer: Medicare HMO | Source: Ambulatory Visit | Attending: Pulmonary Disease | Admitting: Pulmonary Disease

## 2021-08-12 DIAGNOSIS — Z8709 Personal history of other diseases of the respiratory system: Secondary | ICD-10-CM | POA: Diagnosis not present

## 2021-08-12 DIAGNOSIS — K449 Diaphragmatic hernia without obstruction or gangrene: Secondary | ICD-10-CM | POA: Diagnosis not present

## 2021-08-12 DIAGNOSIS — R0602 Shortness of breath: Secondary | ICD-10-CM

## 2021-08-12 DIAGNOSIS — I2510 Atherosclerotic heart disease of native coronary artery without angina pectoris: Secondary | ICD-10-CM | POA: Diagnosis not present

## 2021-08-14 DIAGNOSIS — M25551 Pain in right hip: Secondary | ICD-10-CM | POA: Diagnosis not present

## 2021-08-17 ENCOUNTER — Other Ambulatory Visit: Payer: Self-pay | Admitting: Family Medicine

## 2021-08-17 DIAGNOSIS — L821 Other seborrheic keratosis: Secondary | ICD-10-CM | POA: Diagnosis not present

## 2021-08-17 DIAGNOSIS — L814 Other melanin hyperpigmentation: Secondary | ICD-10-CM | POA: Diagnosis not present

## 2021-08-17 DIAGNOSIS — X32XXXS Exposure to sunlight, sequela: Secondary | ICD-10-CM | POA: Diagnosis not present

## 2021-08-19 DIAGNOSIS — G4733 Obstructive sleep apnea (adult) (pediatric): Secondary | ICD-10-CM | POA: Diagnosis not present

## 2021-08-24 ENCOUNTER — Ambulatory Visit: Payer: Medicare HMO | Admitting: Pulmonary Disease

## 2021-08-24 ENCOUNTER — Encounter: Payer: Self-pay | Admitting: Pulmonary Disease

## 2021-08-24 VITALS — BP 114/72 | HR 52 | Ht <= 58 in | Wt 116.2 lb

## 2021-08-24 DIAGNOSIS — J4520 Mild intermittent asthma, uncomplicated: Secondary | ICD-10-CM

## 2021-08-24 NOTE — Progress Notes (Signed)
? ?Synopsis: Referred in October 2022 for evaluation of asthma by Penni Homans ? ?Subjective:  ? ?PATIENT ID: Julie Wilkerson GENDER: female DOB: 16-Nov-1940, MRN: [id number] ? ?HPI ? ?Chief Complaint  ?Patient presents with  ? Follow-up  ?  F/U after CT on 03/22. States she has had increased SOB and chest tightness for the past 2 weeks.   ? ?Julie Wilkerson is a 81 year old woman, never smoker with history of hypertension, congestive heart failure, coronary artery disease and asthma who returns to pulmonary clinic for asthma.  ? ?She reports 3 weeks ago she was having chest pains and then developed pain in her back as well. She saw her primary care doctor. No issues on X-ray, then had HRCT chest on 08/12/21 which showed air trapping concerning for small airways disease and some bland basilar scarring. She then started using her flovent inhaler 2 puffs twice daily with improvement of the chest pressure and shortness of breath. ? ?She currently denies any symptoms. ? ?OV 03/30/2021 ?She had pulmonary function tests on 11/2 which were within normal limits but she had reaction to the levalbuterol medication with increased anxiety, tremors, chest tightness and palpitations. This resolved with time.  ? ?She has no further complaints since last visit.  ? ?OV 03/05/21 ?She reports having cough for 4 months that has been treated with steroids and antibiotics with complete resolution. She does have some intermittent wheezing and exertional dyspnea. She denies seasonal allergies that affect her breathing. Cold air and cigarette smoke bother her breathing. She has an as needed albuterol inhaler but this causes her to shake and feel jittery.  ? ?Cough for 4 months. Several rounds of steroids and antibiotics.  ? ?Past Medical History:  ?Diagnosis Date  ? Abdominal pain 11/07/2016  ? Accelerating angina ([place]) 02/09/2019  ? Acute diastolic CHF (congestive heart failure) ([place]) 02/16/2019  ? Acute on chronic diastolic CHF (congestive heart  failure) ([place]) 02/23/2019  ? Acute pansinusitis 04/26/2017  ? Amblyopia of eye, left 04/19/2018  ? Anemia   ? Anginal pain ([place])   ? Anxiety   ? Arthritis   ? Asthma   ? Atypical chest pain 02/04/2019  ? Bilateral carotid bruits 03/28/2017  ? Blood transfusion without reported diagnosis   ? Breast cancer ([place])   ? 2002, left, encapsulated, microcalcifications. lumpectomy, radtiation x 30  ? Breast cancer in female [facility])   ? Bursitis of left hip 09/04/2018  ? Change in bowel habits 02/22/2018  ? Change in mole 06/08/2016  ? Chest pain 03/04/2020  ? CHF (congestive heart failure) ([place])   ? Chronic bilateral thoracic back pain 05/23/2016  ? Chronic diastolic congestive heart failure ([place]) 05/28/2019  ? Colitis   ? Complication of anesthesia   ? VERY SENSITIVE TO ANESTHESIA   ? Constipation 01/17/2018  ? Coronary artery disease involving native coronary artery of native heart without angina pectoris 05/28/2019  ? Cortical age-related cataract of right eye 04/18/2018  ? Cough variant asthma  vs UACS from ACEi    ? Spirometry 12/02/2016  FEV1 1.26 (76%)  Ratio 78 with min curvature p last saba > 6 h prior  - trial off acei 12/02/2016  - 12/02/2016  After extensive coaching HFA effectiveness =    75% with qvar autohaler > rechallenge with 80 2bid    ? Decreased visual acuity 01/16/2017  ? Degeneration of lumbar intervertebral disc 08/01/2018  ? Diarrhea 11/16/2015  ? D/o Diverticuli, polyps, celiac disease.    ?  Dyspnea 11/04/2016  ? with asthma exacerbation only  ? Dysuria 11/04/2016  ? Elevated sed rate 10/23/2017  ? Essential hypertension   ? Trial off acei 12/02/2016 due to cough/ pseudoasthma  ? GERD (gastroesophageal reflux disease)   ? Gluten intolerance   ? History of chicken pox   ? History of Helicobacter pylori infection 08/01/2015  ? History of rectal bleeding 06/01/2019  ? Hx of LASIK 04/24/2019  ? Hyperglycemia 11/07/2016  ? Hyperlipidemia   ? Hypertension   ? Hyponatremia 02/16/2019  ? Hypothyroidism   ? IBS  (irritable bowel syndrome)   ? Lattice degeneration of right retina 04/24/2019  ? Left hand pain 05/07/2018  ? Left hip pain 03/01/2017  ? Left leg pain 03/01/2017  ? Left shoulder pain 05/07/2018  ? Low back pain 08/10/2015  ? LUQ pain 06/01/2019  ? 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 Neck pain 03/01/2017  ? Nuclear sclerotic cataract of right eye 04/18/2018  ? Osteoporosis   ? Overweight (BMI 25.0-29.9) 02/14/2019  ? Pain in joint of left shoulder 08/11/2018  ? Paroxysmal atrial tachycardia ([place]) 05/28/2019  ? Pelvic pain 07/02/2018  ? Personal history of radiation therapy   ? Pneumonia   ? PONV (postoperative nausea and vomiting)   ? Poor appetite 02/22/2018  ? Pseudophakia of left eye 04/18/2018  ? PVD (posterior vitreous detachment), right 04/24/2019  ? Restless sleeper 11/07/2016  ? Right hip pain 10/23/2017  ? RLQ discomfort 03/01/2017  ? RLS (restless legs syndrome) 05/07/2019  ? S/P left THA, AA 02/13/2019  ? Sepsis due to pneumonia ([place]) 02/16/2019  ? Sleep apnea   ? uses CPAP  ? Stomach cramps   ? Tremor 02/15/2016  ? Urinary frequency 12/22/2017  ? Urinary incontinence 05/07/2019  ? Urinary tract infection 11/04/2016  ? Vitamin D deficiency 04/29/2016  ?  ? ?Family History  ?Problem Relation Age of Onset  ? Colitis Mother   ? Irritable bowel syndrome Mother   ? Hypertension Father   ? Hyperlipidemia Sister   ? Hyperlipidemia Brother   ? Heart attack Brother   ? Hyperlipidemia Brother   ? Heart attack Brother   ? Hyperlipidemia Brother   ? Heart attack Brother   ? Stomach cancer Paternal Grandmother 85  ? Colon cancer Paternal Grandmother   ? Leukemia Daughter   ? Arthritis Daughter   ? Heart disease Daughter   ?     ASD vs VSD  ? Esophageal cancer Neg Hx   ? Pancreatic cancer Neg Hx   ? Rectal cancer Neg Hx   ?  ? ?Social History  ? ?Socioeconomic History  ? Marital status: Single  ?  Spouse name: Not on file  ? Number of children: 3  ? Years of education:  66  ? Highest education level: Master's degree (e.g., MA, MS, MEng, MEd, MSW, MBA)  ?Occupational History  ? Occupation: retired  ?  Comment: Pharmacist, hospital, kindergarten  ?Tobacco Use  ? Smoking status: Never  ? Smokeless tobacco: Never  ?Vaping Use  ? Vaping Use: Never used  ?Substance and Sexual Activity  ? Alcohol use: Never  ?  Alcohol/week: 0.0 standard drinks  ? Drug use: Never  ? Sexual activity: Not Currently  ?  Birth control/protection: None  ?  Comment: lives alone, avoids daiiry and gluten. 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  ?Other Topics Concern  ? Not on file  ?Social History Narrative  ? Not on file  ? ?Social Determinants  of Health  ? ?Financial Resource Strain: Low Risk   ? Difficulty of Paying Living Expenses: Not hard at all  ?Food Insecurity: No Food Insecurity  ? Worried About Charity fundraiser in the Last Year: Never true  ? Ran Out of Food in the Last Year: Never true  ?Transportation Needs: No Transportation Needs  ? Lack of Transportation (Medical): No  ? Lack of Transportation (Non-Medical): No  ?Physical Activity: Insufficiently Active  ? Days of Exercise per Week: 1 day  ? Minutes of Exercise per Session: 30 min  ?Stress: No Stress Concern Present  ? Feeling of Stress : Not at all  ?Social Connections: Not on file  ?Intimate Partner Violence: Not At Risk  ? Fear of Current or Ex-Partner: No  ? Emotionally Abused: No  ? Physically Abused: No  ? Sexually Abused: No  ?  ? ?Allergies  ?Allergen Reactions  ? Albuterol And Levalbuterol Palpitations  ?  Episode of palpitations, tremor and anxiety when administered levalbuterol for PFT. Recommend avoiding this medicaiton class  ?  ? ?Outpatient Medications Prior to Visit  ?Medication Sig Dispense Refill  ? ASPIRIN 81 PO 81 mg daily.     ? DULoxetine (CYMBALTA) 60 MG capsule Take 1 capsule (60 mg total) by mouth daily. 30 capsule 3  ? erythromycin ophthalmic ointment SMARTSIG:In Eye(s)    ? fluticasone (FLOVENT HFA) 110 MCG/ACT inhaler Inhale 1-2 puffs into  the lungs 2 (two) times daily as needed. 1 each 12  ? furosemide (LASIX) 40 MG tablet Take 1 tablet by mouth once daily 90 tablet 1  ? hyoscyamine (LEVSIN SL) 0.125 MG SL tablet Place 1 tablet (0.125 mg total

## 2021-08-24 NOTE — Patient Instructions (Signed)
Continue flovent 2 puffs twice daily for your asthma symptoms ?- rinse mouth out after each use.  ? ?Continue allegra daily for allergies ? ?If you develop worsening chest tightness, wheezing or shortness of breath despite the above treatments we can consider adding singulair to your asthma regimen.  ? ?Follow up in 6 months.  ?

## 2021-08-24 NOTE — Progress Notes (Deleted)
? ?Subjective:  ? ? Patient ID: Julie Wilkerson, female    DOB: 1941-04-16, 81 y.o.   MRN: [id number] ? ?No chief complaint on file. ? ? ?HPI ?Patient is in today for a follow up. ? ?Past Medical History:  ?Diagnosis Date  ? Abdominal pain 11/07/2016  ? Accelerating angina ([place]) 02/09/2019  ? Acute diastolic CHF (congestive heart failure) ([place]) 02/16/2019  ? Acute on chronic diastolic CHF (congestive heart failure) ([place]) 02/23/2019  ? Acute pansinusitis 04/26/2017  ? Amblyopia of eye, left 04/19/2018  ? Anemia   ? Anginal pain ([place])   ? Anxiety   ? Arthritis   ? Asthma   ? Atypical chest pain 02/04/2019  ? Bilateral carotid bruits 03/28/2017  ? Blood transfusion without reported diagnosis   ? Breast cancer ([place])   ? 2002, left, encapsulated, microcalcifications. lumpectomy, radtiation x 30  ? Breast cancer in female [facility])   ? Bursitis of left hip 09/04/2018  ? Change in bowel habits 02/22/2018  ? Change in mole 06/08/2016  ? Chest pain 03/04/2020  ? CHF (congestive heart failure) ([place])   ? Chronic bilateral thoracic back pain 05/23/2016  ? Chronic diastolic congestive heart failure ([place]) 05/28/2019  ? Colitis   ? Complication of anesthesia   ? VERY SENSITIVE TO ANESTHESIA   ? Constipation 01/17/2018  ? Coronary artery disease involving native coronary artery of native heart without angina pectoris 05/28/2019  ? Cortical age-related cataract of right eye 04/18/2018  ? Cough variant asthma  vs UACS from ACEi    ? Spirometry 12/02/2016  FEV1 1.26 (76%)  Ratio 78 with min curvature p last saba > 6 h prior  - trial off acei 12/02/2016  - 12/02/2016  After extensive coaching HFA effectiveness =    75% with qvar autohaler > rechallenge with 80 2bid    ? Decreased visual acuity 01/16/2017  ? Degeneration of lumbar intervertebral disc 08/01/2018  ? Diarrhea 11/16/2015  ? D/o Diverticuli, polyps, celiac disease.    ? Dyspnea 11/04/2016  ? with asthma exacerbation only  ? Dysuria 11/04/2016  ? Elevated sed rate 10/23/2017  ?  Essential hypertension   ? Trial off acei 12/02/2016 due to cough/ pseudoasthma  ? GERD (gastroesophageal reflux disease)   ? Gluten intolerance   ? History of chicken pox   ? History of Helicobacter pylori infection 08/01/2015  ? History of rectal bleeding 06/01/2019  ? Hx of LASIK 04/24/2019  ? Hyperglycemia 11/07/2016  ? Hyperlipidemia   ? Hypertension   ? Hyponatremia 02/16/2019  ? Hypothyroidism   ? IBS (irritable bowel syndrome)   ? Lattice degeneration of right retina 04/24/2019  ? Left hand pain 05/07/2018  ? Left hip pain 03/01/2017  ? Left leg pain 03/01/2017  ? Left shoulder pain 05/07/2018  ? Low back pain 08/10/2015  ? LUQ pain 06/01/2019  ? 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 Neck pain 03/01/2017  ? Nuclear sclerotic cataract of right eye 04/18/2018  ? Osteoporosis   ? Overweight (BMI 25.0-29.9) 02/14/2019  ? Pain in joint of left shoulder 08/11/2018  ? Paroxysmal atrial tachycardia ([place]) 05/28/2019  ? Pelvic pain 07/02/2018  ? Personal history of radiation therapy   ? Pneumonia   ? PONV (postoperative nausea and vomiting)   ? Poor appetite 02/22/2018  ? Pseudophakia of left eye 04/18/2018  ? PVD (posterior vitreous detachment), right 04/24/2019  ? Restless sleeper 11/07/2016  ? Right hip pain 10/23/2017  ? RLQ  discomfort 03/01/2017  ? RLS (restless legs syndrome) 05/07/2019  ? S/P left THA, AA 02/13/2019  ? Sepsis due to pneumonia ([place]) 02/16/2019  ? Sleep apnea   ? uses CPAP  ? Stomach cramps   ? Tremor 02/15/2016  ? Urinary frequency 12/22/2017  ? Urinary incontinence 05/07/2019  ? Urinary tract infection 11/04/2016  ? Vitamin D deficiency 04/29/2016  ? ? ?Past Surgical History:  ?Procedure Laterality Date  ? ABDOMINAL HYSTERECTOMY    ? partial  ? APPENDECTOMY    ? BREAST LUMPECTOMY Left 2002  ? CHOLECYSTECTOMY  Age 6 or 56  ? COLONOSCOPY  2017  ? EYE SURGERY Left   ? cataract  ? LEFT HEART CATH AND CORONARY ANGIOGRAPHY N/A 02/09/2019  ? Procedure:  LEFT HEART CATH AND CORONARY ANGIOGRAPHY;  Surgeon: Nelva Bush, MD;  Location: [facility];  Service: Cardiovascular;  Laterality: N/A;  ? TONSILLECTOMY    ? TOTAL HIP ARTHROPLASTY Left 02/13/2019  ? Procedure: TOTAL HIP ARTHROPLASTY ANTERIOR APPROACH;  Surgeon: Paralee Cancel, MD;  Location: [facility];  Service: Orthopedics;  Laterality: Left;  70 mins  ? UPPER GASTROINTESTINAL ENDOSCOPY    ? ? ?Family History  ?Problem Relation Age of Onset  ? Colitis Mother   ? Irritable bowel syndrome Mother   ? Hypertension Father   ? Hyperlipidemia Sister   ? Hyperlipidemia Brother   ? Heart attack Brother   ? Hyperlipidemia Brother   ? Heart attack Brother   ? Hyperlipidemia Brother   ? Heart attack Brother   ? Stomach cancer Paternal Grandmother 70  ? Colon cancer Paternal Grandmother   ? Leukemia Daughter   ? Arthritis Daughter   ? Heart disease Daughter   ?     ASD vs VSD  ? Esophageal cancer Neg Hx   ? Pancreatic cancer Neg Hx   ? Rectal cancer Neg Hx   ? ? ?Social History  ? ?Socioeconomic History  ? Marital status: Single  ?  Spouse name: Not on file  ? Number of children: 3  ? Years of education: 36  ? Highest education level: Master's degree (e.g., MA, MS, MEng, MEd, MSW, MBA)  ?Occupational History  ? Occupation: retired  ?  Comment: Pharmacist, hospital, kindergarten  ?Tobacco Use  ? Smoking status: Never  ? Smokeless tobacco: Never  ?Vaping Use  ? Vaping Use: Never used  ?Substance and Sexual Activity  ? Alcohol use: Never  ?  Alcohol/week: 0.0 standard drinks  ? Drug use: Never  ? Sexual activity: Not Currently  ?  Birth control/protection: None  ?  Comment: lives alone, avoids daiiry and gluten. 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  ?Other Topics Concern  ? Not on file  ?Social History Narrative  ? Not on file  ? ?Social Determinants of Health  ? ?Financial Resource Strain: Low Risk   ? Difficulty of Paying Living Expenses: Not hard at all  ?Food Insecurity: No Food Insecurity  ? Worried About Charity fundraiser in the Last  Year: Never true  ? Ran Out of Food in the Last Year: Never true  ?Transportation Needs: No Transportation Needs  ? Lack of Transportation (Medical): No  ? Lack of Transportation (Non-Medical): No  ?Physical Activity: Insufficiently Active  ? Days of Exercise per Week: 1 day  ? Minutes of Exercise per Session: 30 min  ?Stress: No Stress Concern Present  ? Feeling of Stress : Not at all  ?Social Connections: Not on file  ?Intimate Partner Violence: Not At  Risk  ? Fear of Current or Ex-Partner: No  ? Emotionally Abused: No  ? Physically Abused: No  ? Sexually Abused: No  ? ? ?Outpatient Medications Prior to Visit  ?Medication Sig Dispense Refill  ? ASPIRIN 81 PO 81 mg daily.     ? DULoxetine (CYMBALTA) 60 MG capsule Take 1 capsule (60 mg total) by mouth daily. 30 capsule 3  ? erythromycin ophthalmic ointment SMARTSIG:In Eye(s)    ? fluticasone (FLOVENT HFA) 110 MCG/ACT inhaler Inhale 1-2 puffs into the lungs 2 (two) times daily as needed. 1 each 12  ? furosemide (LASIX) 40 MG tablet Take 1 tablet by mouth once daily 90 tablet 1  ? hyoscyamine (LEVSIN SL) 0.125 MG SL tablet Place 1 tablet (0.125 mg total) under the tongue 2 (two) times daily. 30 tablet 1  ? levothyroxine (SYNTHROID) 50 MCG tablet TAKE 1 TABLET BY MOUTH ONCE DAILY BEFORE BREAKFAST 90 tablet 0  ? lisinopril (ZESTRIL) 10 MG tablet Take 1 tablet by mouth twice daily as needed 180 tablet 0  ? metoprolol succinate (TOPROL-XL) 25 MG 24 hr tablet TAKE 1 TABLET BY MOUTH AT BEDTIME 90 tablet 0  ? NON FORMULARY as needed (wheezing, SOB). Iprasynt inhaler (unable to find english equivalent med name)    ? omega-3 acid ethyl esters (LOVAZA) 1 g capsule Take 2 g by mouth daily.    ? omeprazole (PRILOSEC) 40 MG capsule Take 1 capsule (40 mg total) by mouth in the morning and at bedtime. Take 1 tablet by mouth once to twice daily 60 capsule 5  ? ondansetron (ZOFRAN) 4 MG tablet Dissolve 1 tablet under tongue once to twice a day, every day 90 tablet 3  ? potassium  chloride (KLOR-CON M) 10 MEQ tablet Take 1 tablet by mouth once daily 90 tablet 0  ? sucralfate (CARAFATE) 1 g tablet Take 1 tablet (1 g total) by mouth 2 (two) times daily. 60 tablet 0  ? ?No facility-administered medi

## 2021-08-25 ENCOUNTER — Ambulatory Visit (INDEPENDENT_AMBULATORY_CARE_PROVIDER_SITE_OTHER): Payer: Medicare HMO | Admitting: Family Medicine

## 2021-08-25 ENCOUNTER — Encounter: Payer: Self-pay | Admitting: Family Medicine

## 2021-08-25 VITALS — BP 118/80 | HR 50 | Resp 20 | Ht <= 58 in | Wt 117.0 lb

## 2021-08-25 DIAGNOSIS — I10 Essential (primary) hypertension: Secondary | ICD-10-CM | POA: Diagnosis not present

## 2021-08-25 DIAGNOSIS — E782 Mixed hyperlipidemia: Secondary | ICD-10-CM

## 2021-08-25 DIAGNOSIS — E039 Hypothyroidism, unspecified: Secondary | ICD-10-CM

## 2021-08-25 DIAGNOSIS — R102 Pelvic and perineal pain: Secondary | ICD-10-CM | POA: Diagnosis not present

## 2021-08-25 DIAGNOSIS — K589 Irritable bowel syndrome without diarrhea: Secondary | ICD-10-CM

## 2021-08-25 DIAGNOSIS — N390 Urinary tract infection, site not specified: Secondary | ICD-10-CM

## 2021-08-25 DIAGNOSIS — I729 Aneurysm of unspecified site: Secondary | ICD-10-CM | POA: Insufficient documentation

## 2021-08-25 DIAGNOSIS — K529 Noninfective gastroenteritis and colitis, unspecified: Secondary | ICD-10-CM

## 2021-08-25 DIAGNOSIS — J4520 Mild intermittent asthma, uncomplicated: Secondary | ICD-10-CM | POA: Insufficient documentation

## 2021-08-25 DIAGNOSIS — R700 Elevated erythrocyte sedimentation rate: Secondary | ICD-10-CM

## 2021-08-25 DIAGNOSIS — F419 Anxiety disorder, unspecified: Secondary | ICD-10-CM

## 2021-08-25 DIAGNOSIS — R109 Unspecified abdominal pain: Secondary | ICD-10-CM

## 2021-08-25 DIAGNOSIS — I471 Supraventricular tachycardia: Secondary | ICD-10-CM

## 2021-08-25 DIAGNOSIS — E559 Vitamin D deficiency, unspecified: Secondary | ICD-10-CM

## 2021-08-25 DIAGNOSIS — J40 Bronchitis, not specified as acute or chronic: Secondary | ICD-10-CM

## 2021-08-25 DIAGNOSIS — Z0000 Encounter for general adult medical examination without abnormal findings: Secondary | ICD-10-CM

## 2021-08-25 DIAGNOSIS — R350 Frequency of micturition: Secondary | ICD-10-CM

## 2021-08-25 DIAGNOSIS — T81718A Complication of other artery following a procedure, not elsewhere classified, initial encounter: Secondary | ICD-10-CM

## 2021-08-25 DIAGNOSIS — M810 Age-related osteoporosis without current pathological fracture: Secondary | ICD-10-CM

## 2021-08-25 DIAGNOSIS — R32 Unspecified urinary incontinence: Secondary | ICD-10-CM

## 2021-08-25 DIAGNOSIS — R69 Illness, unspecified: Secondary | ICD-10-CM | POA: Diagnosis not present

## 2021-08-25 NOTE — Assessment & Plan Note (Signed)
Was seen by pulmonology and they recommend she use her Flovent twice daily routinely and since she started doing that she has been breathing better.  ?

## 2021-08-25 NOTE — Patient Instructions (Addendum)
NOW [employer] at [place] ? ?Urinary Incontinence ?Urinary incontinence refers to a condition in which a person is unable to control where and when to pass urine. A person with this condition will urinate involuntarily. This means that the person urinates when he or she does not mean to. ?What are the causes? ?This condition may be caused by: ?Medicines. ?Infections. ?Constipation. ?Overactive bladder muscles. ?Weak bladder muscles. ?Weak pelvic floor muscles. These muscles provide support for the bladder, intestine, and, in women, the uterus. ?Enlarged prostate in men. The prostate is a gland near the bladder. When it gets too big, it can pinch the urethra. With the urethra blocked, the bladder can weaken and lose the ability to empty properly. ?Surgery. ?Emotional factors, such as anxiety, stress, or post-traumatic stress disorder (PTSD). ?Spinal cord injury, nerve injury, or other neurological conditions. ?Pelvic organ prolapse. This happens in women when organs move out of place and into the vagina. This movement can prevent the bladder and urethra from working properly. ?What increases the risk? ?The following factors may make you more likely to develop this condition: ?Age. The older you are, the higher the risk. ?Obesity. ?Being physically inactive. ?Pregnancy and childbirth. ?Menopause. ?Diseases that affect the nerves or spinal cord. ?Long-term, or chronic, coughing. This can increase pressure on the bladder and pelvic floor muscles. ?What are the signs or symptoms? ?Symptoms may vary depending on the type of urinary incontinence you have. They include: ?A sudden urge to urinate, and passing urine involuntarily before you can get to a bathroom (urge incontinence). ?Suddenly passing urine when doing activities that force urine to pass, such as coughing, laughing, exercising, or sneezing (stress incontinence). ?Needing to urinate often but urinating only a small amount, or constantly  dribbling urine (overflow incontinence). ?Urinating because you cannot get to the bathroom in time due to a physical disability, such as arthritis or injury, or due to a communication or thinking problem, such as Alzheimer's disease (functional incontinence). ?How is this diagnosed? ?This condition may be diagnosed based on: ?Your medical history. ?A physical exam. ?Tests, such as: ?Urine tests. ?X-rays of your kidney and bladder. ?Ultrasound. ?CT scan. ?Cystoscopy. In this procedure, a health care provider inserts a tube with a light and camera (cystoscope) through the urethra and into the bladder to check for problems. ?Urodynamic testing. These tests assess how well the bladder, urethra, and sphincter can store and release urine. There are different types of urodynamic tests, and they vary depending on what the test is measuring. ?To help diagnose your condition, your health care provider may recommend that you keep a log of when you urinate and how much you urinate. ?How is this treated? ?Treatment for this condition depends on the type of incontinence that you have and its cause. Treatment may include: ?Lifestyle changes, such as: ?Quitting smoking. ?Maintaining a healthy weight. ?Staying active. Try to get 150 minutes of moderate-intensity exercise every week. Ask your health care provider which activities are safe for you. ?Eating a healthy diet. ?Avoid high-fat foods, like fried foods. ?Avoid refined carbohydrates like white bread and white rice. ?Limit how much alcohol and caffeine you drink. ?Increase your fiber intake. Healthy sources of fiber include beans, whole grains, and fresh fruits and vegetables. ?Behavioral changes, such as: ?Pelvic floor muscle exercises. ?Bladder training, such as lengthening the amount of time between bathroom breaks, or using the bathroom at regular intervals. ?Using techniques to suppress bladder urges. This can include distraction techniques or controlled breathing  exercises. ?Medicines,  such as: ?Medicines to relax the bladder muscles and prevent bladder spasms. ?Medicines to help slow or prevent the growth of a man's prostate. ?Botox injections. These can help relax the bladder muscles. ?Treatments, such as: ?Using pulses of electricity to help change bladder reflexes (electrical nerve stimulation). ?For women, using a medical device to prevent urine leaks. This is a small, tampon-like, disposable device that is inserted into the urethra. ?Injecting collagen or carbon beads (bulking agents) into the urinary sphincter. These can help thicken tissue and close the bladder opening. ?Surgery. ?Follow these instructions at home: ?Lifestyle ?Limit alcohol and caffeine. These can fill your bladder quickly and irritate it. ?Keep yourself clean to help prevent odors and skin damage. Ask your health care provider about special skin creams and cleansers that can protect the skin from urine. ?Consider wearing pads or adult diapers. Make sure to change them regularly, and always change them right after experiencing incontinence. ?General instructions ?Take over-the-counter and prescription medicines only as told by your health care provider. ?Use the bathroom about every 3-4 hours, even if you do not feel the need to urinate. Try to empty your bladder completely every time. After urinating, wait a minute. Then try to urinate again. ?Make sure you are in a relaxed position while urinating. ?If your incontinence is caused by nerve problems, keep a log of the medicines you take and the times you go to the bathroom. ?Keep all follow-up visits. This is important. ?Where to find more information ?[employer] of Diabetes and Digestive and Kidney Diseases: [employer] ?American Urology Association: www.urologyhealth.org ?Contact a health care provider if: ?You have pain that gets worse. ?Your incontinence gets worse. ?Get help right away if: ?You have a fever or chills. ?You are  unable to urinate. ?You have redness in your groin area or down your legs. ?Summary ?Urinary incontinence refers to a condition in which a person is unable to control where and when to pass urine. ?This condition may be caused by medicines, infection, weak bladder muscles, weak pelvic floor muscles, enlargement of the prostate (in men), or surgery. ?Factors such as older age, obesity, pregnancy and childbirth, menopause, neurological diseases, and chronic coughing may increase your risk for developing this condition. ?Types of urinary incontinence include urge incontinence, stress incontinence, overflow incontinence, and functional incontinence. ?This condition is usually treated first with lifestyle and behavioral changes, such as quitting smoking, eating a healthier diet, and doing regular pelvic floor exercises. Other treatment options include medicines, bulking agents, medical devices, electrical nerve stimulation, or surgery. ?This information is not intended to replace advice given to you by your health care provider. Make sure you discuss any questions you have with your health care provider. ?Document Revised: 12/14/2019 Document Reviewed: 12/14/2019 ?Elsevier Patient Education ? [place]. ? ?

## 2021-08-25 NOTE — Assessment & Plan Note (Signed)
Well controlled, no changes to meds. Encouraged heart healthy diet such as the DASH diet and exercise as tolerated.  °

## 2021-08-25 NOTE — Progress Notes (Signed)
? ?Subjective:  ? ?By signing my name below, I, [name], attest that this documentation has been prepared under the direction and in the presence of Penni Homans MD, 08/25/2021 ? ? Patient ID: Julie Wilkerson, female    DOB: 1940/08/20, 81 y.o.   MRN: [id number] ? ?Chief Complaint  ?Patient presents with  ? Follow-up  ? ? ?HPI ?Patient is in today for an office visit. ? ?She reports no recent febrile illness or hospitalizations ? ?She complains of persistent abdominal pain. She went to Dr. Havery Moros on 06/29/2021. She was diagnosed with an infection in her gastrointestinal tract. She was given 550 MG of Rifaximin and symptoms in her upper abdominal were resolved. However, the pain in her lower abdomen is still persistent. She describes the pain as a stabbing sensation and worsens when she experiences a bowel movement. Symptoms also worsen when she eats and pain last for an extended period of time.She also experiences urinary incontinence, urgency and frequency. She states that her urine appears darker. She has been regularly drinking water. She denies of any hematuria.  She takes 0.125 MG of Hyoscyamine when pain is elevated. She occasionally gets diarrhea and constipation. She states that she does take Benefiber daily and Align to help alleviate symptoms.  ? ?She went to Dr. Erin Fulling on 08/24/2021 for her chest pains and shortness of breath. She was advised to use her inhaler twice a day and as needed. Her symptoms were resolved.  ? ?She denies of any CP/palp/SOB/HA/congestion/fevers c/o. She is taking meds as prescribed ? ? ?Past Medical History:  ?Diagnosis Date  ? Abdominal pain 11/07/2016  ? Accelerating angina ([place]) 02/09/2019  ? Acute diastolic CHF (congestive heart failure) ([place]) 02/16/2019  ? Acute on chronic diastolic CHF (congestive heart failure) ([place]) 02/23/2019  ? Acute pansinusitis 04/26/2017  ? Amblyopia of eye, left 04/19/2018  ? Anemia   ? Anginal pain ([place])   ? Anxiety   ? Arthritis   ? Asthma    ? Atypical chest pain 02/04/2019  ? Bilateral carotid bruits 03/28/2017  ? Blood transfusion without reported diagnosis   ? Breast cancer ([place])   ? 2002, left, encapsulated, microcalcifications. lumpectomy, radtiation x 30  ? Breast cancer in female [facility])   ? Bursitis of left hip 09/04/2018  ? Change in bowel habits 02/22/2018  ? Change in mole 06/08/2016  ? Chest pain 03/04/2020  ? CHF (congestive heart failure) ([place])   ? Chronic bilateral thoracic back pain 05/23/2016  ? Chronic diastolic congestive heart failure ([place]) 05/28/2019  ? Colitis   ? Complication of anesthesia   ? VERY SENSITIVE TO ANESTHESIA   ? Constipation 01/17/2018  ? Coronary artery disease involving native coronary artery of native heart without angina pectoris 05/28/2019  ? Cortical age-related cataract of right eye 04/18/2018  ? Cough variant asthma  vs UACS from ACEi    ? Spirometry 12/02/2016  FEV1 1.26 (76%)  Ratio 78 with min curvature p last saba > 6 h prior  - trial off acei 12/02/2016  - 12/02/2016  After extensive coaching HFA effectiveness =    75% with qvar autohaler > rechallenge with 80 2bid    ? Decreased visual acuity 01/16/2017  ? Degeneration of lumbar intervertebral disc 08/01/2018  ? Diarrhea 11/16/2015  ? D/o Diverticuli, polyps, celiac disease.    ? Dyspnea 11/04/2016  ? with asthma exacerbation only  ? Dysuria 11/04/2016  ? Elevated sed rate 10/23/2017  ? Essential hypertension   ? Trial off  acei 12/02/2016 due to cough/ pseudoasthma  ? GERD (gastroesophageal reflux disease)   ? Gluten intolerance   ? History of chicken pox   ? History of Helicobacter pylori infection 08/01/2015  ? History of rectal bleeding 06/01/2019  ? Hx of LASIK 04/24/2019  ? Hyperglycemia 11/07/2016  ? Hyperlipidemia   ? Hypertension   ? Hyponatremia 02/16/2019  ? Hypothyroidism   ? IBS (irritable bowel syndrome)   ? Lattice degeneration of right retina 04/24/2019  ? Left hand pain 05/07/2018  ? Left hip pain 03/01/2017  ? Left leg pain 03/01/2017  ?  Left shoulder pain 05/07/2018  ? Low back pain 08/10/2015  ? LUQ pain 06/01/2019  ? 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 Neck pain 03/01/2017  ? Nuclear sclerotic cataract of right eye 04/18/2018  ? Osteoporosis   ? Overweight (BMI 25.0-29.9) 02/14/2019  ? Pain in joint of left shoulder 08/11/2018  ? Paroxysmal atrial tachycardia ([place]) 05/28/2019  ? Pelvic pain 07/02/2018  ? Personal history of radiation therapy   ? Pneumonia   ? PONV (postoperative nausea and vomiting)   ? Poor appetite 02/22/2018  ? Pseudophakia of left eye 04/18/2018  ? PVD (posterior vitreous detachment), right 04/24/2019  ? Restless sleeper 11/07/2016  ? Right hip pain 10/23/2017  ? RLQ discomfort 03/01/2017  ? RLS (restless legs syndrome) 05/07/2019  ? S/P left THA, AA 02/13/2019  ? Sepsis due to pneumonia ([facility]) 02/16/2019  ? Sleep apnea   ? uses CPAP  ? Stomach cramps   ? Tremor 02/15/2016  ? Urinary frequency 12/22/2017  ? Urinary incontinence 05/07/2019  ? Urinary tract infection 11/04/2016  ? Vitamin D deficiency 04/29/2016  ? ? ?Past Surgical History:  ?Procedure Laterality Date  ? ABDOMINAL HYSTERECTOMY    ? partial  ? APPENDECTOMY    ? BREAST LUMPECTOMY Left 2002  ? CHOLECYSTECTOMY  Age 73 or 30  ? COLONOSCOPY  2017  ? EYE SURGERY Left   ? cataract  ? LEFT HEART CATH AND CORONARY ANGIOGRAPHY N/A 02/09/2019  ? Procedure: LEFT HEART CATH AND CORONARY ANGIOGRAPHY;  Surgeon: Nelva Bush, MD;  Location: [place] [facility];  Service: Cardiovascular;  Laterality: N/A;  ? TONSILLECTOMY    ? TOTAL HIP ARTHROPLASTY Left 02/13/2019  ? Procedure: TOTAL HIP ARTHROPLASTY ANTERIOR APPROACH;  Surgeon: Paralee Cancel, MD;  Location: [facility];  Service: Orthopedics;  Laterality: Left;  70 mins  ? UPPER GASTROINTESTINAL ENDOSCOPY    ? ? ?Family History  ?Problem Relation Age of Onset  ? Colitis Mother   ? Irritable bowel syndrome Mother   ? Hypertension Father   ? Hyperlipidemia Sister   ? Hyperlipidemia  Brother   ? Heart attack Brother   ? Hyperlipidemia Brother   ? Heart attack Brother   ? Hyperlipidemia Brother   ? Heart attack Brother   ? Stomach cancer Paternal Grandmother 79  ? Colon cancer Paternal Grandmother   ? Leukemia Daughter   ? Arthritis Daughter   ? Heart disease Daughter   ?     ASD vs VSD  ? Esophageal cancer Neg Hx   ? Pancreatic cancer Neg Hx   ? Rectal cancer Neg Hx   ? ? ?Social History  ? ?Socioeconomic History  ? Marital status: Single  ?  Spouse name: Not on file  ? Number of children: 3  ? Years of education: 108  ? Highest education level: Master's degree (e.g., MA, MS, MEng, MEd, MSW, MBA)  ?  Occupational History  ? Occupation: retired  ?  Comment: Pharmacist, hospital, kindergarten  ?Tobacco Use  ? Smoking status: Never  ? Smokeless tobacco: Never  ?Vaping Use  ? Vaping Use: Never used  ?Substance and Sexual Activity  ? Alcohol use: Never  ?  Alcohol/week: 0.0 standard drinks  ? Drug use: Never  ? Sexual activity: Not Currently  ?  Birth control/protection: None  ?  Comment: lives alone, avoids daiiry and gluten. 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  ?Other Topics Concern  ? Not on file  ?Social History Narrative  ? Not on file  ? ?Social Determinants of Health  ? ?Financial Resource Strain: Low Risk   ? Difficulty of Paying Living Expenses: Not hard at all  ?Food Insecurity: No Food Insecurity  ? Worried About Charity fundraiser in the Last Year: Never true  ? Ran Out of Food in the Last Year: Never true  ?Transportation Needs: No Transportation Needs  ? Lack of Transportation (Medical): No  ? Lack of Transportation (Non-Medical): No  ?Physical Activity: Insufficiently Active  ? Days of Exercise per Week: 1 day  ? Minutes of Exercise per Session: 30 min  ?Stress: No Stress Concern Present  ? Feeling of Stress : Not at all  ?Social Connections: Not on file  ?Intimate Partner Violence: Not At Risk  ? Fear of Current or Ex-Partner: No  ? Emotionally Abused: No  ? Physically Abused: No  ? Sexually Abused: No   ? ? ?Outpatient Medications Prior to Visit  ?Medication Sig Dispense Refill  ? ASPIRIN 81 PO 81 mg daily.     ? erythromycin ophthalmic ointment SMARTSIG:In Eye(s)    ? fluticasone (FLOVENT HFA) 110 MCG/ACT inha

## 2021-08-25 NOTE — Assessment & Plan Note (Signed)
Supplement and monitor 

## 2021-08-25 NOTE — Assessment & Plan Note (Signed)
resolved 

## 2021-08-25 NOTE — Assessment & Plan Note (Signed)
Treated with Xifaxin and she notes her upper abdomen feels better but her lower abdomen and pelvis still have daily pain. Changes with position changes, bowel movements, eating no set pattern. Check labs and referred to urogynegology for further evaluation.  ?

## 2021-08-25 NOTE — Assessment & Plan Note (Signed)
Repeat sed rate today ?

## 2021-08-26 LAB — CBC WITH DIFFERENTIAL/PLATELET
Basophils Absolute: 0.1 10*3/uL (ref 0.0–0.1)
Basophils Relative: 1 % (ref 0.0–3.0)
Eosinophils Absolute: 0 10*3/uL (ref 0.0–0.7)
Eosinophils Relative: 0 % (ref 0.0–5.0)
HCT: 33.5 % — ABNORMAL LOW (ref 36.0–46.0)
Hemoglobin: 11.2 g/dL — ABNORMAL LOW (ref 12.0–15.0)
Lymphocytes Relative: 27.5 % (ref 12.0–46.0)
Lymphs Abs: 1.7 10*3/uL (ref 0.7–4.0)
MCHC: 33.3 g/dL (ref 30.0–36.0)
MCV: 88.7 fl (ref 78.0–100.0)
Monocytes Absolute: 0.7 10*3/uL (ref 0.1–1.0)
Monocytes Relative: 10.9 % (ref 3.0–12.0)
Neutro Abs: 3.6 10*3/uL (ref 1.4–7.7)
Neutrophils Relative %: 60.6 % (ref 43.0–77.0)
Platelets: 208 10*3/uL (ref 150.0–400.0)
RBC: 3.78 Mil/uL — ABNORMAL LOW (ref 3.87–5.11)
RDW: 15.3 % (ref 11.5–15.5)
WBC: 6 10*3/uL (ref 4.0–10.5)

## 2021-08-26 LAB — URINALYSIS
Bilirubin Urine: NEGATIVE
Hgb urine dipstick: NEGATIVE
Ketones, ur: NEGATIVE
Leukocytes,Ua: NEGATIVE
Nitrite: NEGATIVE
Specific Gravity, Urine: <=1.005 — AB (ref 1.000–1.030)
Total Protein, Urine: NEGATIVE
Urine Glucose: NEGATIVE
Urobilinogen, UA: 0.2 (ref 0.0–1.0)
pH: 6 (ref 5.0–8.0)

## 2021-08-26 LAB — COMPREHENSIVE METABOLIC PANEL
ALT: 14 U/L (ref 0–35)
AST: 20 U/L (ref 0–37)
Albumin: 4.5 g/dL (ref 3.5–5.2)
Alkaline Phosphatase: 73 U/L (ref 39–117)
BUN: 37 mg/dL — ABNORMAL HIGH (ref 6–23)
CO2: 26 mEq/L (ref 19–32)
Calcium: 9.2 mg/dL (ref 8.4–10.5)
Chloride: 104 mEq/L (ref 96–112)
Creatinine, Ser: 1.3 mg/dL — ABNORMAL HIGH (ref 0.40–1.20)
GFR: 39.04 mL/min — ABNORMAL LOW (ref >60.00)
Glucose, Bld: 99 mg/dL (ref 70–99)
Potassium: 4.5 mEq/L (ref 3.5–5.1)
Sodium: 138 mEq/L (ref 135–145)
Total Bilirubin: 0.5 mg/dL (ref 0.2–1.2)
Total Protein: 6.8 g/dL (ref 6.0–8.3)

## 2021-08-26 LAB — URINE CULTURE
MICRO NUMBER:: 13220319
SPECIMEN QUALITY:: ADEQUATE

## 2021-08-26 LAB — SEDIMENTATION RATE: Sed Rate: 27 mm/hr (ref 0–30)

## 2021-08-27 ENCOUNTER — Other Ambulatory Visit: Payer: Self-pay

## 2021-08-27 DIAGNOSIS — I10 Essential (primary) hypertension: Secondary | ICD-10-CM

## 2021-09-04 ENCOUNTER — Telehealth (INDEPENDENT_AMBULATORY_CARE_PROVIDER_SITE_OTHER): Payer: Medicare HMO | Admitting: Medical

## 2021-09-04 ENCOUNTER — Ambulatory Visit (INDEPENDENT_AMBULATORY_CARE_PROVIDER_SITE_OTHER): Payer: Medicare HMO | Admitting: Medical

## 2021-09-04 ENCOUNTER — Ambulatory Visit (HOSPITAL_BASED_OUTPATIENT_CLINIC_OR_DEPARTMENT_OTHER)
Admission: RE | Admit: 2021-09-04 | Discharge: 2021-09-04 | Disposition: A | Discharge status: Home / Self Care | Payer: Medicare HMO | Source: Ambulatory Visit | Attending: Medical | Admitting: Medical

## 2021-09-04 VITALS — BP 135/63 | HR 50 | Temp 98.0°F | Resp 16 | Ht <= 58 in | Wt 114.0 lb

## 2021-09-04 DIAGNOSIS — R1030 Lower abdominal pain, unspecified: Secondary | ICD-10-CM | POA: Diagnosis not present

## 2021-09-04 DIAGNOSIS — R1032 Left lower quadrant pain: Secondary | ICD-10-CM

## 2021-09-04 DIAGNOSIS — K5900 Constipation, unspecified: Secondary | ICD-10-CM | POA: Diagnosis not present

## 2021-09-04 LAB — CBC WITH DIFFERENTIAL/PLATELET
Absolute Monocytes: 570 cells/uL (ref 200–950)
Basophils Absolute: 0 cells/uL (ref 0–200)
Basophils Relative: 0 %
Eosinophils Absolute: 0 cells/uL — ABNORMAL LOW (ref 15–500)
Eosinophils Relative: 0 %
HCT: 34.4 % — ABNORMAL LOW (ref 35.0–45.0)
Hemoglobin: 11.3 g/dL — ABNORMAL LOW (ref 11.7–15.5)
Lymphs Abs: 1950 cells/uL (ref 850–3900)
MCH: 30 pg (ref 27.0–33.0)
MCHC: 32.8 g/dL (ref 32.0–36.0)
MCV: 91.2 fL (ref 80.0–100.0)
MPV: 11.7 fL (ref 7.5–12.5)
Monocytes Relative: 9.5 %
Neutro Abs: 3480 cells/uL (ref 1500–7800)
Neutrophils Relative %: 58 %
Platelets: 190 10*3/uL (ref 140–400)
RBC: 3.77 10*6/uL — ABNORMAL LOW (ref 3.80–5.10)
RDW: 13.9 % (ref 11.0–15.0)
Total Lymphocyte: 32.5 %
WBC: 6 10*3/uL (ref 3.8–10.8)

## 2021-09-04 LAB — COMPREHENSIVE METABOLIC PANEL
AG Ratio: 1.6 (calc) (ref 1.0–2.5)
ALT: 15 U/L (ref 6–29)
AST: 21 U/L (ref 10–35)
Albumin: 4.3 g/dL (ref 3.6–5.1)
Alkaline phosphatase (APISO): 71 U/L (ref 37–153)
BUN/Creatinine Ratio: 24 (calc) — ABNORMAL HIGH (ref 6–22)
BUN: 34 mg/dL — ABNORMAL HIGH (ref 7–25)
CO2: 25 mmol/L (ref 20–32)
Calcium: 9 mg/dL (ref 8.6–10.4)
Chloride: 102 mmol/L (ref 98–110)
Creat: 1.39 mg/dL — ABNORMAL HIGH (ref 0.60–1.00)
Globulin: 2.7 g/dL (calc) (ref 1.9–3.7)
Glucose, Bld: 102 mg/dL — ABNORMAL HIGH (ref 65–99)
Potassium: 4.4 mmol/L (ref 3.5–5.3)
Sodium: 136 mmol/L (ref 135–146)
Total Bilirubin: 0.5 mg/dL (ref 0.2–1.2)
Total Protein: 7 g/dL (ref 6.1–8.1)

## 2021-09-04 LAB — POC URINALSYSI DIPSTICK (AUTOMATED)
Bilirubin, UA: NEGATIVE
Blood, UA: NEGATIVE
Glucose, UA: NEGATIVE
Ketones, UA: NEGATIVE
Leukocytes, UA: NEGATIVE
Nitrite, UA: NEGATIVE
Protein, UA: NEGATIVE
Spec Grav, UA: 1.01
Urobilinogen, UA: 0.2 U/dL
pH, UA: 5

## 2021-09-04 LAB — LIPASE: Lipase: 25 U/L (ref 7–60)

## 2021-09-04 MED ORDER — METRONIDAZOLE 500 MG PO TABS: 500.0000 mg | ORAL_TABLET | Freq: Three times a day (TID) | ORAL | 0 refills | Status: AC

## 2021-09-04 MED ORDER — CIPROFLOXACIN HCL 250 MG PO TABS: 250.0000 mg | ORAL_TABLET | Freq: Two times a day (BID) | ORAL | 0 refills | Status: AC

## 2021-09-04 MED ORDER — DULOXETINE HCL 30 MG PO CPEP: 30.0000 mg | ORAL_CAPSULE | Freq: Every day | ORAL | 0 refills | Status: DC

## 2021-09-04 NOTE — Addendum Note (Signed)
Addended by: Anabel Halon on: 09/04/2021 04:23 PM ? ? Modules accepted: Orders ? ?

## 2021-09-04 NOTE — Patient Instructions (Addendum)
Chronic abdomen pain for months with work-up by myself in the past and extensive work-up/evaluation with patient's GI MD. Increase pain over past week.  Some possible constipation described recently.  Known diverticulosis by prior CT but last CT did not show diverticulitis.  Some lower suprapubic region pain presently and left lower quadrant region pain. ? ?Continue rifaximin 550 mg 3 times daily.  On review you had stopped Cymbalta 60 mg in the past thinking that it made you lightheaded but you did note some improvement in your symptoms.  So in light of this we will go ahead and prescribe Cymbalta 30 mg/lower dose with hopes you won't have  lightheaded side effects.  Be careful/cautious on ambulating.  If you do think it is making you lightheaded then stop Cymbalta. ? ?Will get CBC, CMP and lipase stat.  Placing order for CT abdomen pelvis with contrast to be done stat.  Asking referral staff to try to get prior authorization. ? ?POCT urinalysis today in the office and culture as well. ? ?Follow-up 5 to 7 days with PCP or sooner if needed. ?If despite above measures pain worsening or changing then recommend emergency department evaluation.  Mention ED evaluation option presently but patient indicated  she did not think necessary at this time. ? ?Pt can't get ct abdomen pelvis until July 10, 23. So will get dg abd. Rx antibiotic. Advise if pain worsens then go to ED. ?

## 2021-09-04 NOTE — Telephone Encounter (Signed)
Lower dose cymbalta sent to pharmacy. ?

## 2021-09-04 NOTE — Progress Notes (Addendum)
? ?Subjective:  ? ? Patient ID: Julie Wilkerson, female    DOB: [birth year], 81 y.o.   MRN: [id number] ? ?HPI ?Pt in today reporting her chronic abdomen pain is "so bad". She point to her suprapubic area and left side abdomen. Pt tells me he abdomen pain is about same as it has been in the past. She has had extensive evaluation for this in the past.  ? ?Pt states she feels constipated then has loose stools.Pt last bowel movement was today. She felt constipated and had to strain. Then states finally did go to bathroom. Today eventually had medium solid stool. Yesterday she felt constipated and then eventually had water stool.  ? ?Pt family members in [place] think she has divertiultis. ? ?I saw her in January for similar presentation and did CT which showed. ? ?IMPRESSION: ?1. No acute process identified. ?2. Small to moderate sized hiatal hernia. ?3. Mild colonic diverticulosis. Retained fecal material throughout ?the colon. ?  ? ? ?In February pt GI MD plan. ? ?Plan: ?- stool for fecal pancreatic elastase ?- provided free Rifaximin sample - 2 week course - '550mg'$  TID for ongoing bloating / abdominal discomfort  ?- consider switch from SSRI to cymbalta in the setting of chronic pain / bloating, will discuss with primary care team ?  ? ?Pt was on cymbalta and she stopped med without being told to stop per pt. She had refill. She thinks tablet may have made her light headed. Pt was on 60 mg daiy. ? ?Pt does have some low back pain. Pt stats had conversation wit Dr. Nelva Bush that some of her abdomen pain may be occurring from her back.  ? ?Pt also was referred to Urogyn to evaluate is gyn cause of her lower abdomen pain. She has appt for December 30, 2021. ? ? ? ?Review of Systems  ?Constitutional:  Negative for chills, fatigue and fever.  ?Respiratory:  Negative for cough, chest tightness, shortness of breath and wheezing.   ?Cardiovascular:  Negative for chest pain and palpitations.  ?Gastrointestinal:  Positive for abdominal  pain and constipation. Negative for abdominal distention, blood in stool, diarrhea, nausea and vomiting.  ?Genitourinary:  Negative for dysuria, flank pain, frequency and vaginal discharge.  ?Neurological:  Negative for dizziness, seizures and headaches.  ?Hematological:  Negative for adenopathy. Does not bruise/bleed easily.  ?Psychiatric/Behavioral:  Negative for behavioral problems and confusion.   ? ? ?Past Medical History:  ?Diagnosis Date  ? Abdominal pain 11/07/2016  ? Accelerating angina ([place]) 02/09/2019  ? Acute diastolic CHF (congestive heart failure) ([place]) 02/16/2019  ? Acute on chronic diastolic CHF (congestive heart failure) ([place]) 02/23/2019  ? Acute pansinusitis 04/26/2017  ? Amblyopia of eye, left 04/19/2018  ? Anemia   ? Anginal pain ([place])   ? Anxiety   ? Arthritis   ? Asthma   ? Atypical chest pain 02/04/2019  ? Bilateral carotid bruits 03/28/2017  ? Blood transfusion without reported diagnosis   ? Breast cancer ([place])   ? 2002, left, encapsulated, microcalcifications. lumpectomy, radtiation x 30  ? Breast cancer in female [facility])   ? Bursitis of left hip 09/04/2018  ? Change in bowel habits 02/22/2018  ? Change in mole 06/08/2016  ? Chest pain 03/04/2020  ? CHF (congestive heart failure) ([place])   ? Chronic bilateral thoracic back pain 05/23/2016  ? Chronic diastolic congestive heart failure ([place]) 05/28/2019  ? Colitis   ? Complication of anesthesia   ? VERY SENSITIVE TO ANESTHESIA   ?  Constipation 01/17/2018  ? Coronary artery disease involving native coronary artery of native heart without angina pectoris 05/28/2019  ? Cortical age-related cataract of right eye 04/18/2018  ? Cough variant asthma  vs UACS from ACEi    ? Spirometry 12/02/2016  FEV1 1.26 (76%)  Ratio 78 with min curvature p last saba > 6 h prior  - trial off acei 12/02/2016  - 12/02/2016  After extensive coaching HFA effectiveness =    75% with qvar autohaler > rechallenge with 80 2bid    ? Decreased visual acuity 01/16/2017  ? Degeneration  of lumbar intervertebral disc 08/01/2018  ? Diarrhea 11/16/2015  ? D/o Diverticuli, polyps, celiac disease.    ? Dyspnea 11/04/2016  ? with asthma exacerbation only  ? Dysuria 11/04/2016  ? Elevated sed rate 10/23/2017  ? Essential hypertension   ? Trial off acei 12/02/2016 due to cough/ pseudoasthma  ? GERD (gastroesophageal reflux disease)   ? Gluten intolerance   ? History of chicken pox   ? History of Helicobacter pylori infection 08/01/2015  ? History of rectal bleeding 06/01/2019  ? Hx of LASIK 04/24/2019  ? Hyperglycemia 11/07/2016  ? Hyperlipidemia   ? Hypertension   ? Hyponatremia 02/16/2019  ? Hypothyroidism   ? IBS (irritable bowel syndrome)   ? Lattice degeneration of right retina 04/24/2019  ? Left hand pain 05/07/2018  ? Left hip pain 03/01/2017  ? Left leg pain 03/01/2017  ? Left shoulder pain 05/07/2018  ? Low back pain 08/10/2015  ? LUQ pain 06/01/2019  ? 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 Neck pain 03/01/2017  ? Nuclear sclerotic cataract of right eye 04/18/2018  ? Osteoporosis   ? Overweight (BMI 25.0-29.9) 02/14/2019  ? Pain in joint of left shoulder 08/11/2018  ? Paroxysmal atrial tachycardia ([place]) 05/28/2019  ? Pelvic pain 07/02/2018  ? Personal history of radiation therapy   ? Pneumonia   ? PONV (postoperative nausea and vomiting)   ? Poor appetite 02/22/2018  ? Pseudophakia of left eye 04/18/2018  ? PVD (posterior vitreous detachment), right 04/24/2019  ? Restless sleeper 11/07/2016  ? Right hip pain 10/23/2017  ? RLQ discomfort 03/01/2017  ? RLS (restless legs syndrome) 05/07/2019  ? S/P left THA, AA 02/13/2019  ? Sepsis due to pneumonia ([place]) 02/16/2019  ? Sleep apnea   ? uses CPAP  ? Stomach cramps   ? Tremor 02/15/2016  ? Urinary frequency 12/22/2017  ? Urinary incontinence 05/07/2019  ? Urinary tract infection 11/04/2016  ? Vitamin D deficiency 04/29/2016  ? ?  ?Social History  ? ?Socioeconomic History  ? Marital status: Single  ?  Spouse  name: Not on file  ? Number of children: 3  ? Years of education: 41  ? Highest education level: Master's degree (e.g., MA, MS, MEng, MEd, MSW, MBA)  ?Occupational History  ? Occupation: retired  ?  Comment: Pharmacist, hospital, kindergarten  ?Tobacco Use  ? Smoking status: Never  ? Smokeless tobacco: Never  ?Vaping Use  ? Vaping Use: Never used  ?Substance and Sexual Activity  ? Alcohol use: Never  ?  Alcohol/week: 0.0 standard drinks  ? Drug use: Never  ? Sexual activity: Not Currently  ?  Birth control/protection: None  ?  Comment: lives alone, avoids daiiry and gluten. 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  ?Other Topics Concern  ? Not on file  ?Social History Narrative  ? Not on file  ? ?Social Determinants of Health  ? ?Financial Resource Strain: Low  Risk   ? Difficulty of Paying Living Expenses: Not hard at all  ?Food Insecurity: No Food Insecurity  ? Worried About Charity fundraiser in the Last Year: Never true  ? Ran Out of Food in the Last Year: Never true  ?Transportation Needs: No Transportation Needs  ? Lack of Transportation (Medical): No  ? Lack of Transportation (Non-Medical): No  ?Physical Activity: Insufficiently Active  ? Days of Exercise per Week: 1 day  ? Minutes of Exercise per Session: 30 min  ?Stress: No Stress Concern Present  ? Feeling of Stress : Not at all  ?Social Connections: Not on file  ?Intimate Partner Violence: Not At Risk  ? Fear of Current or Ex-Partner: No  ? Emotionally Abused: No  ? Physically Abused: No  ? Sexually Abused: No  ? ? ?Past Surgical History:  ?Procedure Laterality Date  ? ABDOMINAL HYSTERECTOMY    ? partial  ? APPENDECTOMY    ? BREAST LUMPECTOMY Left 2002  ? CHOLECYSTECTOMY  Age 67 or 66  ? COLONOSCOPY  2017  ? EYE SURGERY Left   ? cataract  ? LEFT HEART CATH AND CORONARY ANGIOGRAPHY N/A 02/09/2019  ? Procedure: LEFT HEART CATH AND CORONARY ANGIOGRAPHY;  Surgeon: Nelva Bush, MD;  Location: [place] [facility];  Service: Cardiovascular;  Laterality: N/A;  ? TONSILLECTOMY    ? TOTAL  HIP ARTHROPLASTY Left 02/13/2019  ? Procedure: TOTAL HIP ARTHROPLASTY ANTERIOR APPROACH;  Surgeon: Paralee Cancel, MD;  Location: [facility];  Service: Orthopedics;  Laterality: Left;  70 mins  ? UPPER GASTR

## 2021-09-04 NOTE — Addendum Note (Signed)
Addended by: Legrand Rams on: 09/04/2021 05:03 PM ? ? Modules accepted: Orders ? ?

## 2021-09-05 ENCOUNTER — Encounter: Payer: Self-pay | Admitting: Family Medicine

## 2021-09-05 LAB — URINE CULTURE
MICRO NUMBER:: 13265516
Result:: NO GROWTH
SPECIMEN QUALITY:: ADEQUATE

## 2021-09-08 ENCOUNTER — Other Ambulatory Visit: Payer: Self-pay | Admitting: Medical

## 2021-09-19 DIAGNOSIS — G4733 Obstructive sleep apnea (adult) (pediatric): Secondary | ICD-10-CM | POA: Diagnosis not present

## 2021-09-20 ENCOUNTER — Encounter: Payer: Self-pay | Admitting: Family Medicine

## 2021-09-21 ENCOUNTER — Other Ambulatory Visit: Payer: Self-pay | Admitting: Family Medicine

## 2021-09-21 MED ORDER — VENLAFAXINE HCL ER 37.5 MG PO CP24: 37.5000 mg | ORAL_CAPSULE | Freq: Every day | ORAL | 1 refills | Status: DC

## 2021-09-22 MED ORDER — CITALOPRAM HYDROBROMIDE 20 MG PO TABS: 20.0000 mg | ORAL_TABLET | Freq: Every day | ORAL | 1 refills | Status: DC

## 2021-10-06 ENCOUNTER — Encounter: Payer: Self-pay | Admitting: Family Medicine

## 2021-10-06 ENCOUNTER — Ambulatory Visit (INDEPENDENT_AMBULATORY_CARE_PROVIDER_SITE_OTHER): Payer: Medicare HMO | Admitting: Family Medicine

## 2021-10-06 VITALS — BP 124/53 | HR 69 | Ht <= 58 in | Wt 118.8 lb

## 2021-10-06 DIAGNOSIS — J069 Acute upper respiratory infection, unspecified: Secondary | ICD-10-CM | POA: Diagnosis not present

## 2021-10-06 DIAGNOSIS — J4521 Mild intermittent asthma with (acute) exacerbation: Secondary | ICD-10-CM

## 2021-10-06 DIAGNOSIS — R059 Cough, unspecified: Secondary | ICD-10-CM | POA: Diagnosis not present

## 2021-10-06 LAB — POC COVID19 BINAXNOW: SARS Coronavirus 2 Ag: NEGATIVE

## 2021-10-06 MED ORDER — BENZONATATE 200 MG PO CAPS: 200.0000 mg | ORAL_CAPSULE | Freq: Two times a day (BID) | ORAL | 0 refills | Status: DC | PRN

## 2021-10-06 MED ORDER — IPRATROPIUM BROMIDE 0.02 % IN SOLN: 0.5000 mg | Freq: Four times a day (QID) | RESPIRATORY_TRACT | 12 refills | Status: DC | PRN

## 2021-10-06 NOTE — Progress Notes (Signed)
? ?Acute Office Visit ? ?Subjective:  ? ?  ?Patient ID: Julie Wilkerson, female    DOB: 1940/07/29, 81 y.o.   MRN: [id number] ? ?CC: cough ? ? ?HPI ?Patient is in today for cough. ? ?Patient reports that two nights ago she started having dry cough, chest tightness/rib pain, dyspnea, wheezing. She had to use her Flovent at least twice the past 2 days. (She cannot tolerated albuterol). Reports that she sat outside at her daughter's house over the weekend and feels like her allergies flared up. She has also had headaches, body aches, weakness, fatigue, rhinorrhea, sneezing. She denies any fevers, GI/GU symptoms, chest pain, purulent or blood-tinged sputum. She has been taking Allegra and Coricidin which has helped some.  ? ?Reports she does not tolerate prednisone or albuterol.  ? ?She  has an ipratropium bromide inhaler which she says works well, but she prefers the nebulizer form - will order. ? ? ?ROS ?All review of systems negative except what is listed in the HPI ? ? ?   ?Objective:  ?  ?BP (!) 124/53   Pulse 69   Ht '4\' 10"'$  (1.473 m)   Wt 118 lb 12.8 oz (53.9 kg)   SpO2 96%   BMI 24.83 kg/m?  ? ? ?Physical Exam ?Vitals reviewed.  ?Constitutional:   ?   Appearance: Normal appearance.  ?Cardiovascular:  ?   Rate and Rhythm: Normal rate and regular rhythm.  ?Pulmonary:  ?   Effort: Pulmonary effort is normal.  ?   Breath sounds: Normal breath sounds.  ?Musculoskeletal:  ?   Cervical back: Normal range of motion and neck supple. No tenderness.  ?Lymphadenopathy:  ?   Cervical: No cervical adenopathy.  ?Skin: ?   General: Skin is warm and dry.  ?Neurological:  ?   General: No focal deficit present.  ?   Mental Status: She is alert and oriented to person, place, and time. Mental status is at baseline.  ?Psychiatric:     ?   Mood and Affect: Mood normal.     ?   Behavior: Behavior normal.     ?   Thought Content: Thought content normal.     ?   Judgment: Judgment normal.  ? ? ?Results for orders placed or performed in  visit on 10/06/21  ?POC COVID-19 BinaxNow  ?Result Value Ref Range  ? SARS Coronavirus 2 Ag Negative Negative  ? ? ? ?   ?Assessment & Plan:  ? ? ?1. Cough, unspecified type ?2. Viral URI with cough ?3. Mild intermittent asthma with exacerbation ?COVID test is negative. Suspect allergic or other viral trigger. ?I will send in nebulizer solution for you to use as needed.  ?Continue your allergy medicine, Coricidin, and start taking Mucinex. Adding a cough pill for you.  ?We will hold off on steroids for now given your preference to avoid side effects.  ?Continue supportive measures including rest, hydration, humidifier use, steam showers, warm compresses to sinuses, warm liquids with lemon and honey, and over-the-counter cough, cold, and analgesics as needed.   ?If not improving over the next several days, or if symptoms are significantly worse before then, please let us know.  ? ?- POC COVID-19 BinaxNow ?- ipratropium (ATROVENT) 0.02 % nebulizer solution; Take 2.5 mLs (0.5 mg total) by nebulization every 6 (six) hours as needed for wheezing or shortness of breath.  Dispense: 75 mL; Refill: 12 ?- benzonatate (TESSALON) 200 MG capsule; Take 1 capsule (200 mg total) by mouth 2 (  two) times daily as needed for cough.  Dispense: 20 capsule; Refill: 0 ? ? ? ? ?Return if symptoms worsen or fail to improve. ? ?Terrilyn Saver, NP ? ? ?

## 2021-10-06 NOTE — Patient Instructions (Addendum)
COVID test is negative ?I will send in nebulizer solution for you to use as needed.  ?Continue your allergy medicine, Coricidin, and start taking Mucinex. Adding a cough pill for you.  ?We will hold off on steroids for now given your preference to avoid side effects.  ?Continue supportive measures including rest, hydration, humidifier use, steam showers, warm compresses to sinuses, warm liquids with lemon and honey, and over-the-counter cough, cold, and analgesics as needed.   ?If not improving over the next several days, or if symptoms are significantly worse before then, please let us know.  ?

## 2021-10-12 DIAGNOSIS — E785 Hyperlipidemia, unspecified: Secondary | ICD-10-CM | POA: Diagnosis not present

## 2021-10-12 DIAGNOSIS — K219 Gastro-esophageal reflux disease without esophagitis: Secondary | ICD-10-CM | POA: Diagnosis not present

## 2021-10-12 DIAGNOSIS — G4733 Obstructive sleep apnea (adult) (pediatric): Secondary | ICD-10-CM | POA: Diagnosis not present

## 2021-10-12 DIAGNOSIS — I509 Heart failure, unspecified: Secondary | ICD-10-CM | POA: Diagnosis not present

## 2021-10-12 DIAGNOSIS — I471 Supraventricular tachycardia: Secondary | ICD-10-CM | POA: Diagnosis not present

## 2021-10-12 DIAGNOSIS — J45909 Unspecified asthma, uncomplicated: Secondary | ICD-10-CM | POA: Diagnosis not present

## 2021-10-12 DIAGNOSIS — I2510 Atherosclerotic heart disease of native coronary artery without angina pectoris: Secondary | ICD-10-CM | POA: Diagnosis not present

## 2021-10-12 DIAGNOSIS — R69 Illness, unspecified: Secondary | ICD-10-CM | POA: Diagnosis not present

## 2021-10-12 DIAGNOSIS — E039 Hypothyroidism, unspecified: Secondary | ICD-10-CM | POA: Diagnosis not present

## 2021-10-12 DIAGNOSIS — I700 Atherosclerosis of aorta: Secondary | ICD-10-CM | POA: Diagnosis not present

## 2021-10-12 DIAGNOSIS — I110 Hypertensive heart disease with heart failure: Secondary | ICD-10-CM | POA: Diagnosis not present

## 2021-10-19 DIAGNOSIS — G4733 Obstructive sleep apnea (adult) (pediatric): Secondary | ICD-10-CM | POA: Diagnosis not present

## 2021-10-26 DIAGNOSIS — M6289 Other specified disorders of muscle: Secondary | ICD-10-CM | POA: Diagnosis not present

## 2021-10-26 DIAGNOSIS — Z87440 Personal history of urinary (tract) infections: Secondary | ICD-10-CM | POA: Diagnosis not present

## 2021-10-26 DIAGNOSIS — N3941 Urge incontinence: Secondary | ICD-10-CM | POA: Diagnosis not present

## 2021-10-26 DIAGNOSIS — R102 Pelvic and perineal pain: Secondary | ICD-10-CM | POA: Diagnosis not present

## 2021-10-26 DIAGNOSIS — R350 Frequency of micturition: Secondary | ICD-10-CM | POA: Diagnosis not present

## 2021-10-26 DIAGNOSIS — N952 Postmenopausal atrophic vaginitis: Secondary | ICD-10-CM | POA: Diagnosis not present

## 2021-11-02 ENCOUNTER — Emergency Department (HOSPITAL_BASED_OUTPATIENT_CLINIC_OR_DEPARTMENT_OTHER): Payer: Medicare HMO

## 2021-11-02 ENCOUNTER — Emergency Department (HOSPITAL_BASED_OUTPATIENT_CLINIC_OR_DEPARTMENT_OTHER)
Admission: EM | Admit: 2021-11-02 | Discharge: 2021-11-02 | Disposition: A | Discharge status: Home / Self Care | Payer: Medicare HMO | Attending: Emergency Medicine | Admitting: Emergency Medicine

## 2021-11-02 ENCOUNTER — Encounter (HOSPITAL_BASED_OUTPATIENT_CLINIC_OR_DEPARTMENT_OTHER): Payer: Self-pay | Admitting: Emergency Medicine

## 2021-11-02 ENCOUNTER — Ambulatory Visit (INDEPENDENT_AMBULATORY_CARE_PROVIDER_SITE_OTHER): Payer: Medicare HMO | Admitting: Medical

## 2021-11-02 ENCOUNTER — Other Ambulatory Visit: Payer: Self-pay

## 2021-11-02 VITALS — BP 130/72 | HR 50 | Temp 98.0°F | Resp 16 | Ht <= 58 in | Wt 117.4 lb

## 2021-11-02 DIAGNOSIS — I10 Essential (primary) hypertension: Secondary | ICD-10-CM | POA: Diagnosis not present

## 2021-11-02 DIAGNOSIS — R0789 Other chest pain: Secondary | ICD-10-CM

## 2021-11-02 DIAGNOSIS — J45909 Unspecified asthma, uncomplicated: Secondary | ICD-10-CM | POA: Insufficient documentation

## 2021-11-02 DIAGNOSIS — R0600 Dyspnea, unspecified: Secondary | ICD-10-CM

## 2021-11-02 DIAGNOSIS — Z7982 Long term (current) use of aspirin: Secondary | ICD-10-CM | POA: Insufficient documentation

## 2021-11-02 DIAGNOSIS — R079 Chest pain, unspecified: Secondary | ICD-10-CM | POA: Diagnosis not present

## 2021-11-02 DIAGNOSIS — Z79899 Other long term (current) drug therapy: Secondary | ICD-10-CM | POA: Insufficient documentation

## 2021-11-02 DIAGNOSIS — R001 Bradycardia, unspecified: Secondary | ICD-10-CM | POA: Diagnosis not present

## 2021-11-02 DIAGNOSIS — I2510 Atherosclerotic heart disease of native coronary artery without angina pectoris: Secondary | ICD-10-CM | POA: Diagnosis not present

## 2021-11-02 LAB — CBC
HCT: 33 % — ABNORMAL LOW (ref 36.0–46.0)
Hemoglobin: 10.9 g/dL — ABNORMAL LOW (ref 12.0–15.0)
MCH: 30.3 pg (ref 26.0–34.0)
MCHC: 33 g/dL (ref 30.0–36.0)
MCV: 91.7 fL (ref 80.0–100.0)
Platelets: 161 10*3/uL (ref 150–400)
RBC: 3.6 MIL/uL — ABNORMAL LOW (ref 3.87–5.11)
RDW: 15.6 % — ABNORMAL HIGH (ref 11.5–15.5)
WBC: 5.9 10*3/uL (ref 4.0–10.5)
nRBC: 0 % (ref 0.0–0.2)

## 2021-11-02 LAB — CBC WITH DIFFERENTIAL/PLATELET
Abs Immature Granulocytes: 0.02 10*3/uL (ref 0.00–0.07)
Basophils Absolute: 0 10*3/uL (ref 0.0–0.1)
Basophils Relative: 0 %
Eosinophils Absolute: 0 10*3/uL (ref 0.0–0.5)
Eosinophils Relative: 0 %
HCT: 31.6 % — ABNORMAL LOW (ref 36.0–46.0)
Hemoglobin: 10.4 g/dL — ABNORMAL LOW (ref 12.0–15.0)
Immature Granulocytes: 0 %
Lymphocytes Relative: 36 %
Lymphs Abs: 2.2 10*3/uL (ref 0.7–4.0)
MCH: 30 pg (ref 26.0–34.0)
MCHC: 32.9 g/dL (ref 30.0–36.0)
MCV: 91.1 fL (ref 80.0–100.0)
Monocytes Absolute: 0.7 10*3/uL (ref 0.1–1.0)
Monocytes Relative: 12 %
Neutro Abs: 3.2 10*3/uL (ref 1.7–7.7)
Neutrophils Relative %: 52 %
Platelets: 217 10*3/uL (ref 150–400)
RBC: 3.47 MIL/uL — ABNORMAL LOW (ref 3.87–5.11)
RDW: 14.6 % (ref 11.5–15.5)
WBC: 6.1 10*3/uL (ref 4.0–10.5)
nRBC: 0 % (ref 0.0–0.2)

## 2021-11-02 LAB — BASIC METABOLIC PANEL
Anion gap: 6 (ref 5–15)
BUN: 36 mg/dL — ABNORMAL HIGH (ref 8–23)
CO2: 25 mmol/L (ref 22–32)
Calcium: 8.7 mg/dL — ABNORMAL LOW (ref 8.9–10.3)
Chloride: 106 mmol/L (ref 98–111)
Creatinine, Ser: 1.39 mg/dL — ABNORMAL HIGH (ref 0.44–1.00)
GFR, Estimated: 39 mL/min — ABNORMAL LOW (ref >60)
Glucose, Bld: 115 mg/dL — ABNORMAL HIGH (ref 70–99)
Potassium: 4.5 mmol/L (ref 3.5–5.1)
Sodium: 137 mmol/L (ref 135–145)

## 2021-11-02 LAB — TROPONIN I (HIGH SENSITIVITY)
Troponin I (High Sensitivity): 6 ng/L (ref <18)
Troponin I (High Sensitivity): 7 ng/L (ref <18)

## 2021-11-02 MED ORDER — IPRATROPIUM-ALBUTEROL 0.5-2.5 (3) MG/3ML IN SOLN
3.0000 mL | Freq: Once | RESPIRATORY_TRACT | Status: AC
  Administered 2021-11-02: 3 mL via RESPIRATORY_TRACT
  Filled 2021-11-02: qty 3

## 2021-11-02 MED ORDER — KETOROLAC TROMETHAMINE 15 MG/ML IJ SOLN
15.0000 mg | Freq: Once | INTRAMUSCULAR | Status: AC
  Administered 2021-11-02: 15 mg via INTRAVENOUS
  Filled 2021-11-02: qty 1

## 2021-11-02 NOTE — ED Triage Notes (Addendum)
Intermittent centralized chest pain radiating into her back x 1 week. States the pain worsened last night and kept her up all night. She describes It as pressure. Sent from PCP for further eval.

## 2021-11-02 NOTE — Discharge Instructions (Addendum)
Please schedule an appointment with Dr. Claiborne Billings to discuss your intermittent chest pain.  It is possible that you need further evaluation of this as well as your low heart rate.  Do not hesitate to return with any worsening symptoms.

## 2021-11-02 NOTE — Progress Notes (Signed)
Subjective:    Patient ID: Julie Wilkerson, female    DOB: [birth year], 81 y.o.   MRN: [id number]  HPI  Pt in with recent cough. Pt states when she coughs has pain in upper chest and back. However on discussion she states present regardless if coughing or breathing deep.   Pt has this for one week. Last night the pain present. Little productive cough.   Pt notes she has asthma. She thinks recent poor air quality has caused.   Also increased shortness of breath. Sates walking very short distances will have severe shortness of breath.  Pt used nebulizer machine atrovent and states did not help. Pt can't tolerate albuterol.   The 10-year ASCVD risk score (Arnett DK, et al., 2019) is: 32.3%   Values used to calculate the score:     Age: 40 years     Sex: Female     Is Non-Hispanic African American: No     Diabetic: No     Tobacco smoker: No     Systolic Blood Pressure: 638 mmHg     Is BP treated: Yes     HDL Cholesterol: 64.1 mg/dL     Total Cholesterol: 247 mg/dL   Cardiac Hx: She underwent cath 02/09/2019 prior to hip arthroplasty procedure. She had mild-moderate non obstructive dx.   Mild to moderate, non-obstructive coronary artery disease, including sequential 30% proximal and mid LAD stenoses, 50% ostial D1 lesion, and 20% ostial/proximal LMCA and mid RCA lesions. No definite evidence of myocardial bridge on today's study. Normal left ventricular filling pressure.  LVEF noted to be normal on recent echocardiogram.  Review of Systems  Constitutional:  Negative for chills, fatigue and fever.  Respiratory:  Positive for cough and shortness of breath. Negative for chest tightness and wheezing.   Cardiovascular:  Negative for chest pain and palpitations.       Chest wall pain. Reproducible on palpation but present constant as well with back pain.  Gastrointestinal:  Negative for abdominal pain.  Musculoskeletal:  Positive for back pain.  Neurological:  Negative for dizziness,  seizures and headaches.  Hematological:  Negative for adenopathy.  Psychiatric/Behavioral:  Negative for behavioral problems.     Past Medical History:  Diagnosis Date   Abdominal pain 11/07/2016   Accelerating angina (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 lumpectomy, radtiation x 30   Breast cancer in female [facility])    Bursitis of left hip 09/04/2018   Change in bowel habits 02/22/2018   Change in mole 06/08/2016   Chest pain 03/04/2020   CHF (congestive heart failure) (HCC)    Chronic bilateral thoracic back pain 05/23/2016   Chronic diastolic congestive heart failure ([place]) 05/28/2019   Colitis    Complication of anesthesia    VERY SENSITIVE TO ANESTHESIA    Constipation 01/17/2018   Coronary artery disease involving native coronary artery of native heart without angina pectoris 05/28/2019   Cortical age-related cataract of right eye 04/18/2018   Cough variant asthma  vs UACS  from ACEi     Spirometry 12/02/2016  FEV1 1.26 (76%)  Ratio 78 with min curvature p last saba > 6 h prior  - trial off acei 12/02/2016  - 12/02/2016  After extensive coaching HFA effectiveness =    75% with qvar autohaler > rechallenge with 80 2bid     Decreased visual acuity 01/16/2017   Degeneration of lumbar intervertebral disc 08/01/2018   Diarrhea 11/16/2015   D/o Diverticuli, polyps, celiac disease.     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   Left hand pain 05/07/2018   Left hip pain 03/01/2017   Left leg pain 03/01/2017   Left shoulder pain 05/07/2018   Low back pain 08/10/2015   LUQ pain 06/01/2019   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   Neck pain 03/01/2017   Nuclear sclerotic cataract of right eye 04/18/2018   Osteoporosis    Overweight (BMI 25.0-29.9) 02/14/2019   Pain in joint of left shoulder 08/11/2018   Paroxysmal atrial tachycardia ([place]) 05/28/2019   Pelvic pain 07/02/2018   Personal history of radiation therapy    Pneumonia    PONV (postoperative nausea and vomiting)    Poor appetite 02/22/2018   Pseudophakia of left eye 04/18/2018   PVD (posterior vitreous detachment), right 04/24/2019   Restless sleeper 11/07/2016   Right hip pain 10/23/2017   RLQ discomfort 03/01/2017   RLS (restless legs syndrome) 05/07/2019   S/P left THA, AA 02/13/2019   Sepsis due to pneumonia ([place]) 02/16/2019   Sleep apnea    uses CPAP   Stomach cramps    Tremor 02/15/2016   Urinary frequency 12/22/2017   Urinary incontinence 05/07/2019   Urinary tract infection 11/04/2016   Vitamin D deficiency 04/29/2016     Social History   Socioeconomic History   Marital status: Single    Spouse name: Not on file   Number of children: 3   Years of education: 18   Highest education level: Master's degree (e.g., MA, MS, MEng, MEd, MSW, MBA)  Occupational History   Occupation: retired    Comment: Pharmacist, hospital, kindergarten  Tobacco Use   Smoking status: Never    Smokeless tobacco: Never  Vaping Use   Vaping Use: Never used  Substance and Sexual Activity   Alcohol use: Never    Alcohol/week: 0.0 standard drinks of alcohol   Drug use: Never   Sexual activity: Not Currently    Birth control/protection: None    Comment: lives alone, avoids daiiry and gluten. 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  Other Topics Concern   Not on file  Social History Narrative   Not on file   Social Determinants of Health   Financial Resource Strain: Low Risk  (12/19/2020)   Overall Financial Resource Strain (CARDIA)    Difficulty of Paying Living Expenses: Not hard at all  Food Insecurity: No Food Insecurity (12/19/2020)   Hunger Vital  Sign    Worried About Charity fundraiser in the Last Year: Never true    [place] in the Last Year: Never true  Transportation Needs: No Transportation Needs (12/19/2020)   PRAPARE - Hydrologist (Medical): No    Lack of Transportation (Non-Medical): No  Physical Activity: Insufficiently Active (12/19/2020)   Exercise Vital Sign    Days of Exercise per Week: 1 day    Minutes of Exercise per Session: 30 min  Stress: No Stress Concern Present (12/19/2020)   [place]    Feeling of Stress : Not at all  Social Connections: Not on file  Intimate Partner Violence: Not At Risk (12/19/2020)   Humiliation, Afraid, Rape, and Kick questionnaire    Fear of Current or Ex-Partner: No    Emotionally Abused: No    Physically Abused: No    Sexually Abused: No    Past Surgical History:  Procedure Laterality Date   ABDOMINAL HYSTERECTOMY     partial   APPENDECTOMY     BREAST LUMPECTOMY Left 2002   CHOLECYSTECTOMY  Age 10 or 82   COLONOSCOPY  2017   EYE SURGERY Left    cataract   LEFT HEART CATH AND CORONARY ANGIOGRAPHY N/A 02/09/2019   Procedure: LEFT HEART CATH AND CORONARY ANGIOGRAPHY;  Surgeon: Nelva Bush, MD;  Location: [facility];  Service: Cardiovascular;  Laterality: N/A;   TONSILLECTOMY     TOTAL HIP ARTHROPLASTY Left 02/13/2019   Procedure: TOTAL HIP ARTHROPLASTY ANTERIOR APPROACH;  Surgeon: Paralee Cancel, MD;  Location: [facility];  Service: Orthopedics;  Laterality: Left;  70 mins   UPPER GASTROINTESTINAL ENDOSCOPY      Family History  Problem Relation Age of Onset   Colitis Mother    Irritable bowel syndrome Mother    Hypertension Father    Hyperlipidemia Sister    Hyperlipidemia Brother    Heart attack Brother    Hyperlipidemia Brother    Heart attack Brother    Hyperlipidemia Brother    Heart attack Brother    Stomach cancer Paternal Grandmother 27   Colon cancer Paternal Grandmother    Leukemia Daughter    Arthritis Daughter    Heart disease Daughter        ASD vs VSD   Esophageal cancer Neg Hx    Pancreatic cancer Neg Hx    Rectal cancer Neg Hx     Allergies  Allergen Reactions   Albuterol And Levalbuterol Palpitations    Episode of palpitations, tremor and anxiety when administered levalbuterol for PFT. Recommend avoiding this medicaiton class    Current Outpatient Medications on File Prior to Visit  Medication Sig Dispense Refill   ASPIRIN 81 PO 81 mg daily.      benzonatate (TESSALON) 200 MG capsule Take 1 capsule (200 mg total) by mouth 2 (two) times daily as needed for cough. 20 capsule 0   citalopram (CELEXA) 20 MG tablet Take 1 tablet (20 mg total) by mouth at bedtime. 90 tablet 1   erythromycin ophthalmic ointment SMARTSIG:In Eye(s)     fluticasone (FLOVENT HFA) 110 MCG/ACT inhaler Inhale 1-2 puffs into the lungs 2 (two) times daily as needed. 1 each 12   furosemide (LASIX) 40 MG tablet Take 1 tablet by mouth once daily 90 tablet 1   hyoscyamine (LEVSIN SL) 0.125 MG SL tablet Place 1 tablet (0.125 mg total) under the tongue 2 (two) times  daily. 30 tablet 1   ipratropium (ATROVENT) 0.02 % nebulizer solution Take 2.5 mLs (0.5 mg total) by nebulization every 6 (six) hours as needed  for wheezing or shortness of breath. 75 mL 12   levothyroxine (SYNTHROID) 50 MCG tablet TAKE 1 TABLET BY MOUTH ONCE DAILY BEFORE BREAKFAST 90 tablet 0   lisinopril (ZESTRIL) 10 MG tablet Take 1 tablet by mouth twice daily as needed 180 tablet 0   metoprolol succinate (TOPROL-XL) 25 MG 24 hr tablet TAKE 1 TABLET BY MOUTH AT BEDTIME 90 tablet 0   NON FORMULARY as needed (wheezing, SOB). Iprasynt inhaler (unable to find english equivalent med name)     omega-3 acid ethyl esters (LOVAZA) 1 g capsule Take 2 g by mouth daily.     omeprazole (PRILOSEC) 40 MG capsule Take 1 capsule (40 mg total) by mouth in the morning and at bedtime. Take 1 tablet by mouth once to twice daily 60 capsule 5   ondansetron (ZOFRAN) 4 MG tablet Dissolve 1 tablet under tongue once to twice a day, every day 90 tablet 3   potassium chloride (KLOR-CON M) 10 MEQ tablet Take 1 tablet by mouth once daily 90 tablet 0   sucralfate (CARAFATE) 1 g tablet Take 1 tablet by mouth twice daily 60 tablet 0   No current facility-administered medications on file prior to visit.    BP 130/72 (BP Location: Right Arm, Patient Position: Sitting, Cuff Size: Normal)   Pulse (!) 50 Comment: walking pulse increae to 72.  Temp 98 F (36.7 C) (Oral)   Resp 16   Ht '4\' 10"'$  (1.473 m)   Wt 117 lb 6.4 oz (53.3 kg)   SpO2 97% Comment: maintained on walking.  BMI 24.54 kg/m        Objective:   Physical Exam  General- No acute distress. Pleasant patient. Neck- Full range of motion, no jvd Lungs- Clear, even and unlabored. Heart- regular rate and rhythm. Neurologic- CNII- XII grossly intact.  Lower ext- no obvious pedal edema presntly( nights ago moderate swollen per pt.)      Assessment & Plan:   Patient Instructions  Recent cough with history of asthma. Recent worse dyspnea on exertion but some degree of constant chest pain and mid upper back pain even without coughing or deep inspiration. Although you may  have asthma flare with  costochondritis not entirely convinced no other condition present in light of your 10 year cardiovascular risk score of 32.3% and catheterization in 2020. 30% proximal and mid LAD stenoses, 50% ostial D1 lesion, and 20% ostial/proximal LMCA and mid RCA lesions. Also chf on hx review.  I think best to be evaluated in ED for further work up. May be beneficial to get cxr, bnp and troponin studies. I did talk with emergency dept ED MD. Notified of recent presentation. ED MD did notify me that they are very busy. Please update triage/receptionist staff if your symptoms worsen or change.  Follow up date to be determined after ED note review.   Mackie Pai, PA-C   Time spent with patient today was 41  minutes which consisted of chart revdiew, discussing diagnosis, work up treatment and documentation.

## 2021-11-02 NOTE — ED Notes (Signed)
Lab called for recollection of blood. Blood redrawn and sent to lab. EDP aware

## 2021-11-02 NOTE — Patient Instructions (Addendum)
Recent cough with history of asthma. Recent worse dyspnea on exertion but some degree of constant chest pain and mid upper back pain even without coughing or deep inspiration. Although you may  have asthma flare with costochondritis not entirely convinced no other condition present in light of your 10 year cardiovascular risk score of 32.3% and catheterization in 2020. 30% proximal and mid LAD stenoses, 50% ostial D1 lesion, and 20% ostial/proximal LMCA and mid RCA lesions. Also chf on hx review.  Ekg shows marked sinus bracycardia of 46. Though walking pulse increased to 72  I think best to be evaluated in ED for further work up. May be beneficial to get cxr, bnp and troponin studies. I did talk with emergency dept ED MD. Notified of recent presentation. ED MD did notify me that they are very busy. Please update triage/receptionist staff if your symptoms worsen or change.  Follow up date to be determined after ED note review.

## 2021-11-02 NOTE — ED Notes (Signed)
Lab called again with request for redraw. Blood redrawn and sent to lab

## 2021-11-02 NOTE — ED Notes (Signed)
Lab called to add troponin to blood work

## 2021-11-02 NOTE — ED Provider Notes (Signed)
[facility] EMERGENCY DEPARTMENT Provider Note   CSN: [id number] Arrival date & time: 11/02/21  1235     History  Chief Complaint  Patient presents with   Chest Pain    Julie Wilkerson is a 81 y.o. female with a past medical history of hypertension, osteoporosis, Breast Cx 2002, CAD and asthma presenting today with complaint of chest pain.  Says that she has had waxing and waning chest pain over the past week that is substernal but occasionally radiates into her back.  Denies any dizziness or lightheadedness, blood pressure has been reasonably controlled at home.  Says that she has had increased dyspnea on exertion however not during rest.  She has been using her Atrovent inhalers over the week without relief.  She says that this feels like a large asthma exacerbation like she has had in the past.  No recent surgery, travel, tobacco use, DVT/PE or oral estrogen use.   Chest Pain      Home Medications Prior to Admission medications   Medication Sig Start Date End Date Taking? Authorizing Provider  ASPIRIN 81 PO 81 mg daily.     [provider]  benzonatate (TESSALON) 200 MG capsule Take 1 capsule (200 mg total) by mouth 2 (two) times daily as needed for cough. 10/06/21   Terrilyn Saver, NP  citalopram (CELEXA) 20 MG tablet Take 1 tablet (20 mg total) by mouth at bedtime. 09/22/21   [name], MD  erythromycin ophthalmic ointment SMARTSIG:In Eye(s) 01/01/21   [provider]  fluticasone (FLOVENT HFA) 110 MCG/ACT inhaler Inhale 1-2 puffs into the lungs 2 (two) times daily as needed. 03/30/21   Freddi Starr, MD  furosemide (LASIX) 40 MG tablet Take 1 tablet by mouth once daily 06/04/21   [name], MD  hyoscyamine (LEVSIN SL) 0.125 MG SL tablet Place 1 tablet (0.125 mg total) under the tongue 2 (two) times daily. 06/03/21   Saguier, Percell Miller, PA-C  ipratropium (ATROVENT) 0.02 % nebulizer solution Take 2.5 mLs (0.5 mg total) by nebulization every 6  (six) hours as needed for wheezing or shortness of breath. 10/06/21   Terrilyn Saver, NP  levothyroxine (SYNTHROID) 50 MCG tablet TAKE 1 TABLET BY MOUTH ONCE DAILY BEFORE BREAKFAST 08/18/21   [name], MD  lisinopril (ZESTRIL) 10 MG tablet Take 1 tablet by mouth twice daily as needed 04/06/21   [name], MD  metoprolol succinate (TOPROL-XL) 25 MG 24 hr tablet TAKE 1 TABLET BY MOUTH AT BEDTIME 08/03/21   [name], MD  NON FORMULARY as needed (wheezing, SOB). Iprasynt inhaler (unable to find english equivalent med name)    [provider]  omega-3 acid ethyl esters (LOVAZA) 1 g capsule Take 2 g by mouth daily.    [provider]  omeprazole (PRILOSEC) 40 MG capsule Take 1 capsule (40 mg total) by mouth in the morning and at bedtime. Take 1 tablet by mouth once to twice daily 06/29/21   Armbruster, Carlota Raspberry, MD  ondansetron [facility]) 4 MG tablet Dissolve 1 tablet under tongue once to twice a day, every day 04/22/20   Yetta Flock, MD  potassium chloride (KLOR-CON M) 10 MEQ tablet Take 1 tablet by mouth once daily 08/03/21   [name], MD  sucralfate (CARAFATE) 1 g tablet Take 1 tablet by mouth twice daily 09/08/21   Saguier, Percell Miller, PA-C      Allergies    Albuterol and levalbuterol  Review of Systems   Review of Systems  Cardiovascular:  Positive for chest pain.    Physical Exam Updated Vital Signs BP (!) 148/65 (BP Location: Right Arm)   Pulse (!) 46   Temp 97.6 F (36.4 C) (Oral)   Resp 20   SpO2 100%  Physical Exam Vitals and nursing note reviewed.  Constitutional:      Appearance: Normal appearance.  HENT:     Head: Normocephalic and atraumatic.  Eyes:     General: No scleral icterus.    Conjunctiva/sclera: Conjunctivae normal.  Cardiovascular:     Rate and Rhythm: Regular rhythm. Bradycardia present.  Pulmonary:     Effort: Pulmonary effort is normal. No respiratory distress.     Breath sounds: Normal breath sounds.   Musculoskeletal:     Right lower leg: No edema.     Left lower leg: No edema.  Skin:    Findings: No rash.  Neurological:     Mental Status: She is alert.  Psychiatric:        Mood and Affect: Mood normal.     ED Results / Procedures / Treatments   Labs (all labs ordered are listed, but only abnormal results are displayed) Labs Reviewed  CBC - Abnormal; Notable for the following components:      Result Value   RBC 3.60 (*)    Hemoglobin 10.9 (*)    HCT 33.0 (*)    RDW 15.6 (*)    All other components within normal limits  CBC WITH DIFFERENTIAL/PLATELET - Abnormal; Notable for the following components:   RBC 3.47 (*)    Hemoglobin 10.4 (*)    HCT 31.6 (*)    All other components within normal limits  BASIC METABOLIC PANEL - Abnormal; Notable for the following components:   Glucose, Bld 115 (*)    BUN 36 (*)    Creatinine, Ser 1.39 (*)    Calcium 8.7 (*)    GFR, Estimated 39 (*)    All other components within normal limits  TROPONIN I (HIGH SENSITIVITY)  TROPONIN I (HIGH SENSITIVITY)    EKG EKG Interpretation  Date/Time:  Monday November 02 2021 12:42:06 EDT Ventricular Rate:  48 PR Interval:  162 QRS Duration: 70 QT Interval:  436 QTC Calculation: 389 R Axis:   37 Text Interpretation: Sinus bradycardia Otherwise normal ECG When compared with ECG of 23-Dec-2020 10:50, No significant change since last tracing Confirmed by Dorie Rank [phone]) on 11/02/2021 12:47:24 PM  Radiology No results found.  Procedures Procedures   Medications Ordered in ED Medications  ipratropium-albuterol (DUONEB) 0.5-2.5 (3) MG/3ML nebulizer solution 3 mL (3 mLs Nebulization Given 11/02/21 1319)  ketorolac (TORADOL) 15 MG/ML injection 15 mg (15 mg Intravenous Given 11/02/21 1344)    ED Course/ Medical Decision Making/ A&P Clinical Course as of 11/02/21 1404  Mon Nov 02, 2021  1403 Patient was reevaluated at bedside.  She said that she felt better after the DuoNeb.  Continues to deny  pain at this time.  Heart rate noted to be 55 at rest [MR]    Clinical Course User Index [MR] Glorene Leitzke, Cecilio Asper, PA-C                           Medical Decision Making Amount and/or Complexity of Data Reviewed Labs: ordered. Radiology: ordered.  Risk Prescription drug management.   This patient presents to the ED for concern of chest pain. The emergent differential diagnosis of  chest pain includes: Acute coronary syndrome, pericarditis, aortic dissection, pulmonary embolism, tension pneumothorax, and esophageal rupture.    This is not an exhaustive differential.    Past Medical History / Co-morbidities / Social History: Asthma and CAD and questionable CHF     Physical Exam: Physical exam performed. The pertinent findings include: Clear lung sounds, no leg swelling.  Patient noted to be bradycardic in the 40s.  She says that this used to happen years ago because she was very active and played tennis.  Now she says she only goes on walks and over the last week she has been increasingly short of breath.  No dizziness, presyncope, fatigue or weakness associated with this bradycardia  Lab Tests: I ordered, and personally interpreted labs.  The pertinent results include:  -Normal troponin x2 -Creatinine 1.39 which appears to be patient's baseline over the last few months.   Imaging Studies: I ordered imaging studies including chest x-ray. I independently visualized and interpreted imaging which showed no abnormalities. I agree with the radiologist interpretation.   Cardiac Monitoring:  The patient was maintained on a cardiac monitor.  My attending physician MD Tomi Bamberger viewed and interpreted the cardiac monitored which showed an underlying rhythm of: Sinus brady   Medications: I ordered medication including DuoNeb and Toradol. Reevaluation of the patient after these medicines showed that the patient resolved. I have reviewed the patients home medicines and have made adjustments as  needed.  Disposition: 81 year old female who presented today with intermittent chest pain.  Also with increased dyspnea on exertion that she attributes to an asthma exacerbation.  Work-up negative.  Heart score 3.  Continues to be asymptomatic.  After consideration of the diagnostic results and the patients response to treatment, I feel that she is stable for discharge with outpatient follow-up.  She is already established with cardiology and may follow-up outpatient.  It is possible that she needs a stress test or catheterization to further investigate her intermittent chest pain.  She will also discuss her bradycardia with them.  She is agreeable to this plan.  Strict return precautions were given and agreed upon.   I discussed this case with my attending physician Dr. Tomi Bamberger who cosigned this note including patient's presenting symptoms, physical exam, and planned diagnostics and interventions. Attending physician stated agreement with plan or made changes to plan which were implemented.     Final Clinical Impression(s) / ED Diagnoses Final diagnoses:  Bradycardia  Nonspecific chest pain    Rx / DC Orders Results and diagnoses were explained to the patient. Return precautions discussed in full. Patient had no additional questions and expressed complete understanding.   This chart was dictated using voice recognition software.  Despite best efforts to proofread,  errors can occur which can change the documentation meaning.      Darliss Ridgel 11/02/21 1705    Dorie Rank, MD 11/03/21 [phone]

## 2021-11-05 NOTE — Progress Notes (Signed)
Cardiology Office Note:    Date:  11/05/2021   ID:  Julie Wilkerson, DOB 01-02-1941, MRN [id number]  PCP:  [name], MD   [facility] Providers Cardiologist:  Janina Mayo, MD     Referring MD: [name], MD   No chief complaint on file. Chest pain  History of Present Illness:   Initial Visit Julie Wilkerson is a 81 y.o. female with a hx of HTN, non obstructive CAD, central sleep apnea uses CPAP, hypothyroidism,  breast cancer survivor, comes in today for chest pain in her chest and side.Her left arm gets numb. This has been going on for 1 year. Sometimes it is two days in a row, then it stops.It lasts for seconds to minutes. She rubs it and moves her arm. That improves it. He was getting speciality messages that helped with the pain. During the day she does house cleaning , walks in the neighborhood. She has back pain. She noted LH,dizzy sometimes with headaches. No dyspnea on exertion. She can walk up a flight of stairs fine. Her BP at home 120s. No orthopnea, PND, no LE edema. She cannot sleep with her CPAP.  Cardiac Hx: She underwent cath 02/09/2019 prior to hip arthroplasty procedure. She had mild-moderate non obstructive dx. No evidence of myocardial bridge. Normal filling pressures. Saw Dr. Claiborne Billings for a sleep study who recommended CPAP.    Interim Hx: Her pain has not progressed. But it has not resolved. Her house cleaning is going well. No symptoms with activity  Interim hx 11/05/2021 She was seen in the ED on 11/02/2021 for chest pain. Notes substernal discomfort that radiates to her back. She was having DOE. She notes resolution of chest pain. Notes swelling in her ankles that occurs in the afternoon for which she wears compression stockings. She is struggling with her CPAP. Blood pressure is well controlled. She also reported pain in her R calf with carrying groceries as well as her thighs   Cardiology Studies:  TTE 01/13/21 Normal LV fxn Normal RV fxn No valve  dx Normal IVC  Cath 02/09/2019 - LHC mild-moderate non obstructive dx  Cardiac Monitor: palpitations 04/03/2019 9 SVTlongest 13 beats. No atrial fibrillation, no block. No VT.  SPECT 01/18/2017 -Normal  03/29/2017 BL carotid US negative  Past Medical History:  Diagnosis Date   Abdominal pain 11/07/2016   Accelerating angina ([place]) 28/78/6767   Acute diastolic CHF (congestive heart failure) ([place]) 02/16/2019   Acute on chronic diastolic CHF (congestive heart failure) ([place]) 02/23/2019   Acute pansinusitis 04/26/2017   Amblyopia of eye, left 04/19/2018   Anemia    Anginal pain (HCC)    Anxiety    Arthritis    Asthma    Atypical chest pain 02/04/2019   Bilateral carotid bruits 03/28/2017   Blood transfusion without reported diagnosis    Breast cancer ([place])    2002, left, encapsulated, microcalcifications. lumpectomy, radtiation x 30   Breast cancer in female [facility])    Bursitis of left hip 09/04/2018   Change in bowel habits 02/22/2018   Change in mole 06/08/2016   Chest pain 03/04/2020   CHF (congestive heart failure) (HCC)    Chronic bilateral thoracic back pain 05/23/2016   Chronic diastolic congestive heart failure ([place]) 05/28/2019   Colitis    Complication of anesthesia    VERY SENSITIVE TO ANESTHESIA    Constipation 01/17/2018   Coronary artery disease involving native coronary artery of native heart without angina pectoris 05/28/2019  Cortical age-related cataract of right eye 04/18/2018   Cough variant asthma  vs UACS from ACEi     Spirometry 12/02/2016  FEV1 1.26 (76%)  Ratio 78 with min curvature p last saba > 6 h prior  - trial off acei 12/02/2016  - 12/02/2016  After extensive coaching HFA effectiveness =    75% with qvar autohaler > rechallenge with 80 2bid     Decreased visual acuity 01/16/2017   Degeneration of lumbar intervertebral disc 08/01/2018   Diarrhea 11/16/2015   D/o Diverticuli, polyps, celiac disease.     Dyspnea 11/04/2016   with asthma exacerbation  only   Dysuria 11/04/2016   Elevated sed rate 10/23/2017   Essential hypertension    Trial off acei 12/02/2016 due to cough/ pseudoasthma   GERD (gastroesophageal reflux disease)    Gluten intolerance    History of chicken pox    History of Helicobacter pylori infection 08/01/2015   History of rectal bleeding 06/01/2019   Hx of LASIK 04/24/2019   Hyperglycemia 11/07/2016   Hyperlipidemia    Hypertension    Hyponatremia 02/16/2019   Hypothyroidism    IBS (irritable bowel syndrome)    Lattice degeneration of right retina 04/24/2019   Left hand pain 05/07/2018   Left hip pain 03/01/2017   Left leg pain 03/01/2017   Left shoulder pain 05/07/2018   Low back pain 08/10/2015   LUQ pain 06/01/2019   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   Neck pain 03/01/2017   Nuclear sclerotic cataract of right eye 04/18/2018   Osteoporosis    Overweight (BMI 25.0-29.9) 02/14/2019   Pain in joint of left shoulder 08/11/2018   Paroxysmal atrial tachycardia ([place]) 05/28/2019   Pelvic pain 07/02/2018   Personal history of radiation therapy    Pneumonia    PONV (postoperative nausea and vomiting)    Poor appetite 02/22/2018   Pseudophakia of left eye 04/18/2018   PVD (posterior vitreous detachment), right 04/24/2019   Restless sleeper 11/07/2016   Right hip pain 10/23/2017   RLQ discomfort 03/01/2017   RLS (restless legs syndrome) 05/07/2019   S/P left THA, AA 02/13/2019   Sepsis due to pneumonia ([place]) 02/16/2019   Sleep apnea    uses CPAP   Stomach cramps    Tremor 02/15/2016   Urinary frequency 12/22/2017   Urinary incontinence 05/07/2019   Urinary tract infection 11/04/2016   Vitamin D deficiency 04/29/2016    Past Surgical History:  Procedure Laterality Date   ABDOMINAL HYSTERECTOMY     partial   APPENDECTOMY     BREAST LUMPECTOMY Left 2002   CHOLECYSTECTOMY  Age 40 or 61   COLONOSCOPY  2017   EYE SURGERY Left    cataract   LEFT HEART  CATH AND CORONARY ANGIOGRAPHY N/A 02/09/2019   Procedure: LEFT HEART CATH AND CORONARY ANGIOGRAPHY;  Surgeon: Nelva Bush, MD;  Location: [facility];  Service: Cardiovascular;  Laterality: N/A;   TONSILLECTOMY     TOTAL HIP ARTHROPLASTY Left 02/13/2019   Procedure: TOTAL HIP ARTHROPLASTY ANTERIOR APPROACH;  Surgeon: Paralee Cancel, MD;  Location: [facility];  Service: Orthopedics;  Laterality: Left;  70 mins   UPPER GASTROINTESTINAL ENDOSCOPY      Current Medications: No outpatient medications have been marked as taking for the 11/06/21 encounter (Appointment) with Janina Mayo, MD.     Allergies:   Albuterol and levalbuterol   Social History   Socioeconomic History   Marital status: Single  Spouse name: Not on file   Number of children: 3   Years of education: 63   Highest education level: Master's degree (e.g., MA, MS, MEng, MEd, MSW, MBA)  Occupational History   Occupation: retired    Comment: Pharmacist, hospital, kindergarten  Tobacco Use   Smoking status: Never   Smokeless tobacco: Never  Vaping Use   Vaping Use: Never used  Substance and Sexual Activity   Alcohol use: Never    Alcohol/week: 0.0 standard drinks of alcohol   Drug use: Never   Sexual activity: Not Currently    Birth control/protection: None    Comment: lives alone, avoids daiiry and gluten. 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  Other Topics Concern   Not on file  Social History Narrative   Not on file   Social Determinants of Health   Financial Resource Strain: Low Risk  (12/19/2020)   Overall Financial Resource Strain (CARDIA)    Difficulty of Paying Living Expenses: Not hard at all  Food Insecurity: No Food Insecurity (12/19/2020)   Hunger Vital Sign    Worried About Running Out of Food in the Last Year: Never true    [place] in the Last Year: Never true  Transportation Needs: No Transportation Needs (12/19/2020)   PRAPARE - Hydrologist (Medical): No    Lack of  Transportation (Non-Medical): No  Physical Activity: Insufficiently Active (12/19/2020)   Exercise Vital Sign    Days of Exercise per Week: 1 day    Minutes of Exercise per Session: 30 min  Stress: No Stress Concern Present (12/19/2020)   [place]    Feeling of Stress : Not at all  Social Connections: Not on file     Family History: The patient's family history includes Arthritis in her daughter; Colitis in her mother; Colon cancer in her paternal grandmother; Heart attack in her brother, brother, and brother; Heart disease in her daughter; Hyperlipidemia in her brother, brother, brother, and sister; Hypertension in her father; Irritable bowel syndrome in her mother; Leukemia in her daughter; Stomach cancer (age of onset: 79) in her paternal grandmother. There is no history of Esophageal cancer, Pancreatic cancer, or Rectal cancer.  ROS:   Please see the history of present illness.     All other systems reviewed and are negative.  EKGs/Labs/Other Studies Reviewed:    The following studies were reviewed today:   EKG:  EKG is  ordered today.  The ekg ordered today demonstrates   Sinus bradycardia,non specific ST-T changes  Sinus bradycardia   Recent Labs: 12/23/2020: B Natriuretic Peptide 190.5 06/23/2021: Pro B Natriuretic peptide (BNP) 159.0 06/30/2021: TSH 2.37 09/04/2021: ALT 15 11/02/2021: BUN 36; Creatinine, Ser 1.39; Hemoglobin 10.4; Platelets 217; Potassium 4.5; Sodium 137   Recent Lipid Panel    Component Value Date/Time   CHOL 247 (H) 06/30/2021 1215   TRIG 105.0 06/30/2021 1215   HDL 64.10 06/30/2021 1215   CHOLHDL 4 06/30/2021 1215   VLDL 21.0 06/30/2021 1215   LDLCALC 162 (H) 06/30/2021 1215   LDLCALC 79 02/01/2020 1020     Risk Assessment/Calculations:           Physical Exam:    VS:   Vitals:   11/06/21 0759  BP: 110/60  Pulse: 65  SpO2: 94%     Wt Readings from Last 3 Encounters:   11/02/21 117 lb 6.4 oz (53.3 kg)  10/06/21 118 lb 12.8 oz (53.9 kg)  09/04/21 114 lb (51.7 kg)     GEN:  Well nourished, well developed in no acute distress HEENT: Normal NECK: No JVD; No carotid bruits CARDIAC: RRR, no murmurs, rubs, gallops RESPIRATORY:  Clear to auscultation without rales, wheezing or rhonchi  ABDOMEN: Soft, non-tender, non-distended MUSCULOSKELETAL:  No edema; No deformity  SKIN: Warm and dry NEUROLOGIC:  Alert and oriented x 3 PSYCHIATRIC:  Normal affect   ASSESSMENT:    #CP/DOE: had prior MSK pain. Now with more progressive symptoms including DOE and chest pain. Will plan for coronary CTA  #Leg Cramping: will plan for ABI to ensure this is not related to PAD  HTN: continue lisinopril 10 mg at night, metop XL 25 mg daily.   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 Continue to encourage compression stockings.   PLAN:    In order of problems listed above:  Coronary CTA morph ABI BL Follow up in 3 months   Medication Adjustments/Labs and Tests Ordered: Current medicines are reviewed at length with the patient today.  Concerns regarding medicines are outlined above.    Signed, Janina Mayo, MD  11/05/2021 1:12 PM

## 2021-11-06 ENCOUNTER — Ambulatory Visit (INDEPENDENT_AMBULATORY_CARE_PROVIDER_SITE_OTHER): Payer: Medicare HMO | Admitting: Internal Medicine

## 2021-11-06 ENCOUNTER — Encounter: Payer: Self-pay | Admitting: Internal Medicine

## 2021-11-06 ENCOUNTER — Other Ambulatory Visit: Payer: Self-pay | Admitting: Family Medicine

## 2021-11-06 ENCOUNTER — Other Ambulatory Visit: Payer: Self-pay | Admitting: Internal Medicine

## 2021-11-06 VITALS — BP 110/60 | HR 65 | Ht <= 58 in | Wt 119.0 lb

## 2021-11-06 DIAGNOSIS — R079 Chest pain, unspecified: Secondary | ICD-10-CM

## 2021-11-06 DIAGNOSIS — M79604 Pain in right leg: Secondary | ICD-10-CM

## 2021-11-06 DIAGNOSIS — M79605 Pain in left leg: Secondary | ICD-10-CM

## 2021-11-06 DIAGNOSIS — R252 Cramp and spasm: Secondary | ICD-10-CM

## 2021-11-06 MED ORDER — METOPROLOL TARTRATE 25 MG PO TABS: ORAL_TABLET | ORAL | 0 refills | Status: DC

## 2021-11-06 NOTE — Patient Instructions (Addendum)
Medication Instructions:  TAKE Metoprolol 25 mg two hours before CT when scheduled.   *If you need a refill on your cardiac medications before your next appointment, please call your pharmacy*   Testing/Procedures: Your physician has requested that you have cardiac CT. Cardiac computed tomography (CT) is a painless test that uses an x-ray machine to take clear, detailed pictures of your heart. For further information please visit [email or web address]. Please follow instruction sheet as given.  Your physician has requested that you have an ankle brachial index (ABI). During this test an ultrasound and blood pressure cuff are used to evaluate the arteries that supply the arms and legs with blood. Allow thirty minutes for this exam. There are no restrictions or special instructions.    Follow-Up: At [facility], you and your health needs are our priority.  As part of our continuing mission to provide you with exceptional heart care, we have created designated Provider Care Teams.  These Care Teams include your primary Cardiologist (physician) and Advanced Practice Providers (APPs -  Physician Assistants and Nurse Practitioners) who all work together to provide you with the care you need, when you need it.  We recommend signing up for the patient portal called "MyChart".  Sign up information is provided on this After Visit Summary.  MyChart is used to connect with patients for Virtual Visits (Telemedicine).  Patients are able to view lab/test results, encounter notes, upcoming appointments, etc.  Non-urgent messages can be sent to your provider as well.   To learn more about what you can do with MyChart, go to [email or web address].    Your next appointment:   3 month(s)  The format for your next appointment:   In Person  Provider:   Janina Mayo, MD      Other Instructions   Your cardiac CT will be scheduled at one of the below locations:   [facility] [address] [place], [place] [address] [phone]  If scheduled at [facility], please arrive at the [facility] and Children's Entrance (Entrance C2) of [facility] 30 minutes prior to test start time. You can use the FREE valet parking offered at entrance C (encouraged to control the heart rate for the test)  Proceed to the [facility] Radiology Department (first floor) to check-in and test prep.  All radiology patients and guests should use entrance C2 at [facility], accessed from [facility], even though the hospital's physical address listed is [address]    Please follow these instructions carefully (unless otherwise directed):  Hold all erectile dysfunction medications at least 3 days (72 hrs) prior to test.  On the Night Before the Test: Be sure to Drink plenty of water. Do not consume any caffeinated/decaffeinated beverages or chocolate 12 hours prior to your test. Do not take any antihistamines 12 hours prior to your test.  On the Day of the Test: Drink plenty of water until 1 hour prior to the test. Do not eat any food 4 hours prior to the test. You may take your regular medications prior to the test.  Take metoprolol (Lopressor) two hours prior to test. HOLD Furosemide/Hydrochlorothiazide morning of the test. FEMALES- please wear underwire-free bra if available, avoid dresses & tight clothing      After the Test: Drink plenty of water. After receiving IV contrast, you may experience a mild flushed feeling. This is normal. On occasion, you may experience a mild rash up to 24 hours  after the test. This is not dangerous. If this occurs, you can take Benadryl 25 mg and increase your fluid intake. If you experience trouble breathing, this can be serious. If it is severe call 911 IMMEDIATELY. If it is mild, please call our office. If you take any of these medications: Glipizide/Metformin, Avandament, Glucavance, please do not take 48 hours  after completing test unless otherwise instructed.  We will call to schedule your test 2-4 weeks out understanding that some insurance companies will need an authorization prior to the service being performed.   For non-scheduling related questions, please contact the cardiac imaging nurse navigator should you have any questions/concerns: Marchia Bond, Cardiac Imaging Nurse Navigator Gordy Clement, Cardiac Imaging Nurse Navigator [facility] Direct Office Dial: [phone]   For scheduling needs, including cancellations and rescheduling, please call [place], [phone].

## 2021-11-09 ENCOUNTER — Ambulatory Visit (HOSPITAL_COMMUNITY)
Admission: RE | Admit: 2021-11-09 | Discharge: 2021-11-09 | Disposition: A | Discharge status: Home / Self Care | Payer: Medicare HMO | Source: Ambulatory Visit | Attending: Cardiology | Admitting: Cardiology

## 2021-11-09 DIAGNOSIS — M79604 Pain in right leg: Secondary | ICD-10-CM | POA: Diagnosis not present

## 2021-11-09 DIAGNOSIS — M79605 Pain in left leg: Secondary | ICD-10-CM | POA: Diagnosis not present

## 2021-11-09 DIAGNOSIS — R252 Cramp and spasm: Secondary | ICD-10-CM | POA: Diagnosis not present

## 2021-11-10 ENCOUNTER — Telehealth: Payer: Self-pay | Admitting: *Deleted

## 2021-11-10 NOTE — Telephone Encounter (Signed)
Patient called in to request a appointment to see Dr Claiborne Billings to discuss her possibly discontinuing the use of her CPAP machine. The message was deferred to the scheduler, Imagene Gurney  via staff message to contact the patient.

## 2021-11-18 ENCOUNTER — Other Ambulatory Visit: Payer: Self-pay | Admitting: Family Medicine

## 2021-11-19 DIAGNOSIS — G4733 Obstructive sleep apnea (adult) (pediatric): Secondary | ICD-10-CM | POA: Diagnosis not present

## 2021-11-21 DIAGNOSIS — T148XXA Other injury of unspecified body region, initial encounter: Secondary | ICD-10-CM | POA: Diagnosis not present

## 2021-11-21 DIAGNOSIS — S61215A Laceration without foreign body of left ring finger without damage to nail, initial encounter: Secondary | ICD-10-CM | POA: Diagnosis not present

## 2021-11-21 DIAGNOSIS — M7989 Other specified soft tissue disorders: Secondary | ICD-10-CM | POA: Diagnosis not present

## 2021-11-21 DIAGNOSIS — W2209XA Striking against other stationary object, initial encounter: Secondary | ICD-10-CM | POA: Diagnosis not present

## 2021-11-21 DIAGNOSIS — S60415A Abrasion of left ring finger, initial encounter: Secondary | ICD-10-CM | POA: Diagnosis not present

## 2021-11-21 DIAGNOSIS — S60222A Contusion of left hand, initial encounter: Secondary | ICD-10-CM | POA: Diagnosis not present

## 2021-11-21 DIAGNOSIS — S6992XA Unspecified injury of left wrist, hand and finger(s), initial encounter: Secondary | ICD-10-CM | POA: Diagnosis not present

## 2021-11-21 DIAGNOSIS — S60052A Contusion of left little finger without damage to nail, initial encounter: Secondary | ICD-10-CM | POA: Diagnosis not present

## 2021-11-21 DIAGNOSIS — Y93E9 Activity, other interior property and clothing maintenance: Secondary | ICD-10-CM | POA: Diagnosis not present

## 2021-11-21 DIAGNOSIS — W228XXA Striking against or struck by other objects, initial encounter: Secondary | ICD-10-CM | POA: Diagnosis not present

## 2021-11-21 DIAGNOSIS — M189 Osteoarthritis of first carpometacarpal joint, unspecified: Secondary | ICD-10-CM | POA: Diagnosis not present

## 2021-11-25 ENCOUNTER — Encounter (HOSPITAL_COMMUNITY): Payer: Self-pay

## 2021-11-25 ENCOUNTER — Telehealth (HOSPITAL_COMMUNITY): Payer: Self-pay | Admitting: *Deleted

## 2021-11-25 NOTE — Telephone Encounter (Signed)
Reaching out to patient to offer assistance regarding upcoming cardiac imaging study; pt verbalizes understanding of appt date/time, parking situation and where to check in, pre-test NPO status and medications ordered, and verified current allergies; name and call back number provided for further questions should they arise  Gordy Clement RN Navigator Cardiac Imaging [facility] [phone] office [phone] cell  Patient takes metoprolol succinate in PM and was given Mychart instructions to take the 25 mg metoprolol tartrate for test if her Heart rate is greater than 65bpm. She was made aware to arrive at 11:30am via Mychart instructions.

## 2021-11-26 ENCOUNTER — Ambulatory Visit (HOSPITAL_COMMUNITY)
Admission: RE | Admit: 2021-11-26 | Discharge: 2021-11-26 | Disposition: A | Discharge status: Home / Self Care | Payer: Medicare HMO | Source: Ambulatory Visit | Attending: Internal Medicine | Admitting: Internal Medicine

## 2021-11-26 ENCOUNTER — Encounter (HOSPITAL_COMMUNITY): Payer: Self-pay

## 2021-11-26 DIAGNOSIS — R079 Chest pain, unspecified: Secondary | ICD-10-CM | POA: Diagnosis present

## 2021-11-26 DIAGNOSIS — K449 Diaphragmatic hernia without obstruction or gangrene: Secondary | ICD-10-CM | POA: Diagnosis not present

## 2021-11-26 DIAGNOSIS — I700 Atherosclerosis of aorta: Secondary | ICD-10-CM | POA: Diagnosis not present

## 2021-11-26 DIAGNOSIS — I2510 Atherosclerotic heart disease of native coronary artery without angina pectoris: Secondary | ICD-10-CM | POA: Diagnosis not present

## 2021-11-26 MED ORDER — IOHEXOL 350 MG/ML SOLN
100.0000 mL | Freq: Once | INTRAVENOUS | Status: AC | PRN
  Administered 2021-11-26: 100 mL via INTRAVENOUS

## 2021-11-26 MED ORDER — NITROGLYCERIN 0.4 MG SL SUBL
SUBLINGUAL_TABLET | SUBLINGUAL | Status: AC
  Filled 2021-11-26: qty 2

## 2021-11-26 MED ORDER — NITROGLYCERIN 0.4 MG SL SUBL
0.8000 mg | SUBLINGUAL_TABLET | Freq: Once | SUBLINGUAL | Status: AC
  Administered 2021-11-26: 0.8 mg via SUBLINGUAL

## 2021-11-26 NOTE — Progress Notes (Signed)
CT scan completed. Tolerated well. D/c home ambulatory, awake and alert. In no distress

## 2021-12-15 ENCOUNTER — Encounter: Payer: Self-pay | Admitting: Family Medicine

## 2021-12-15 ENCOUNTER — Ambulatory Visit (INDEPENDENT_AMBULATORY_CARE_PROVIDER_SITE_OTHER): Payer: Medicare HMO | Admitting: Family Medicine

## 2021-12-15 VITALS — BP 126/76 | HR 44 | Resp 20 | Ht <= 58 in | Wt 116.4 lb

## 2021-12-15 DIAGNOSIS — E559 Vitamin D deficiency, unspecified: Secondary | ICD-10-CM | POA: Diagnosis not present

## 2021-12-15 DIAGNOSIS — R32 Unspecified urinary incontinence: Secondary | ICD-10-CM

## 2021-12-15 DIAGNOSIS — D649 Anemia, unspecified: Secondary | ICD-10-CM

## 2021-12-15 DIAGNOSIS — R739 Hyperglycemia, unspecified: Secondary | ICD-10-CM

## 2021-12-15 DIAGNOSIS — E039 Hypothyroidism, unspecified: Secondary | ICD-10-CM | POA: Diagnosis not present

## 2021-12-15 DIAGNOSIS — G8929 Other chronic pain: Secondary | ICD-10-CM | POA: Diagnosis not present

## 2021-12-15 DIAGNOSIS — I10 Essential (primary) hypertension: Secondary | ICD-10-CM | POA: Diagnosis not present

## 2021-12-15 DIAGNOSIS — R0789 Other chest pain: Secondary | ICD-10-CM

## 2021-12-15 DIAGNOSIS — M546 Pain in thoracic spine: Secondary | ICD-10-CM

## 2021-12-15 DIAGNOSIS — R109 Unspecified abdominal pain: Secondary | ICD-10-CM

## 2021-12-15 DIAGNOSIS — E782 Mixed hyperlipidemia: Secondary | ICD-10-CM | POA: Diagnosis not present

## 2021-12-15 DIAGNOSIS — E538 Deficiency of other specified B group vitamins: Secondary | ICD-10-CM | POA: Diagnosis not present

## 2021-12-15 MED ORDER — FEXOFENADINE HCL 180 MG PO TABS: 180.0000 mg | ORAL_TABLET | Freq: Every day | ORAL | Status: AC | PRN

## 2021-12-15 NOTE — Progress Notes (Signed)
Subjective:   By signing my name below, I, [name], attest that this documentation has been prepared under the direction and in the presence of [name], MD 12/15/2021.     Patient ID: Julie Wilkerson, female    DOB: 1940/08/24, 81 y.o.   MRN: [id number]  No chief complaint on file.   HPI Patient is in today for an office visit.  She denies fever, chills, palpitations and hematuria.   GU: She complains of urinary incontinence, urgency and painful burning. She has been seeing her urologist Dr. Aundra Dubin.   Cardiology: She complains of slight chest pain and is currently seeing a cardiologist. She is currently taking 1 Lisinopril 10 mg daily to manage her blood pressure. Her blood pressure is within normal range today.  BP Readings from Last 3 Encounters:  12/15/21 126/76  11/26/21 (!) 116/59  11/06/21 110/60   Pulse Readings from Last 3 Encounters:  12/15/21 (!) 44  11/26/21 61  11/06/21 65   Valium: She reports that in 10/2021 she was given Valium suppositories to manage her GU symptoms, which caused her to have an allergic reaction and be admitted to the ER. This allergic reaction caused bradycardia.   Respiration: She reports that she has been coughing frequently across the past month, especially at night. She states that she has been using a CPAP. She states that she sometimes wheezes. She has been seeing her pulmonologist Dr. Erin Fulling.  Prilosec: She is currently taking Prilosec 40 mg to manage her stomach acid.  Allegra: She states that sometimes takes Allegra to manage her cough.  Vitamin D: She states that she is currently taking Vitamin D.  IBS: She reports that she has IBS and that dairy products worsen this.   Iron: She reports that she is taking iron supplements.   Past Medical History:  Diagnosis Date   Abdominal pain 11/07/2016   Accelerating angina ([place]) 56/31/4970   Acute diastolic CHF (congestive heart failure) ([place]) 02/16/2019   Acute on chronic  diastolic CHF (congestive heart failure) ([place]) 02/23/2019   Acute pansinusitis 04/26/2017   Amblyopia of eye, left 04/19/2018   Anemia    Anginal pain (HCC)    Anxiety    Arthritis    Asthma    Atypical chest pain 02/04/2019   Bilateral carotid bruits 03/28/2017   Blood transfusion without reported diagnosis    Breast cancer ([place])    2002, left, encapsulated, microcalcifications. lumpectomy, radtiation x 30   Breast cancer in female [facility])    Bursitis of left hip 09/04/2018   Change in bowel habits 02/22/2018   Change in mole 06/08/2016   Chest pain 03/04/2020   CHF (congestive heart failure) (HCC)    Chronic bilateral thoracic back pain 05/23/2016   Chronic diastolic congestive heart failure ([place]) 05/28/2019   Colitis    Complication of anesthesia    VERY SENSITIVE TO ANESTHESIA    Constipation 01/17/2018   Coronary artery disease involving native coronary artery of native heart without angina pectoris 05/28/2019   Cortical age-related cataract of right eye 04/18/2018   Cough variant asthma  vs UACS from ACEi     Spirometry 12/02/2016  FEV1 1.26 (76%)  Ratio 78 with min curvature p last saba > 6 h prior  - trial off acei 12/02/2016  - 12/02/2016  After extensive coaching HFA effectiveness =    75% with qvar autohaler > rechallenge with 80 2bid     Decreased visual acuity 01/16/2017   Degeneration of  lumbar intervertebral disc 08/01/2018   Diarrhea 11/16/2015   D/o Diverticuli, polyps, celiac disease.     Dyspnea 11/04/2016   with asthma exacerbation only   Dysuria 11/04/2016   Elevated sed rate 10/23/2017   Essential hypertension    Trial off acei 12/02/2016 due to cough/ pseudoasthma   GERD (gastroesophageal reflux disease)    Gluten intolerance    History of chicken pox    History of Helicobacter pylori infection 08/01/2015   History of rectal bleeding 06/01/2019   Hx of LASIK 04/24/2019   Hyperglycemia 11/07/2016   Hyperlipidemia    Hypertension    Hyponatremia  02/16/2019   Hypothyroidism    IBS (irritable bowel syndrome)    Lattice degeneration of right retina 04/24/2019   Left hand pain 05/07/2018   Left hip pain 03/01/2017   Left leg pain 03/01/2017   Left shoulder pain 05/07/2018   Low back pain 08/10/2015   LUQ pain 06/01/2019   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   Neck pain 03/01/2017   Nuclear sclerotic cataract of right eye 04/18/2018   Osteoporosis    Overweight (BMI 25.0-29.9) 02/14/2019   Pain in joint of left shoulder 08/11/2018   Paroxysmal atrial tachycardia ([place]) 05/28/2019   Pelvic pain 07/02/2018   Personal history of radiation therapy    Pneumonia    PONV (postoperative nausea and vomiting)    Poor appetite 02/22/2018   Pseudophakia of left eye 04/18/2018   PVD (posterior vitreous detachment), right 04/24/2019   Restless sleeper 11/07/2016   Right hip pain 10/23/2017   RLQ discomfort 03/01/2017   RLS (restless legs syndrome) 05/07/2019   S/P left THA, AA 02/13/2019   Sepsis due to pneumonia ([place]) 02/16/2019   Sleep apnea    uses CPAP   Stomach cramps    Tremor 02/15/2016   Urinary frequency 12/22/2017   Urinary incontinence 05/07/2019   Urinary tract infection 11/04/2016   Vitamin D deficiency 04/29/2016    Past Surgical History:  Procedure Laterality Date   ABDOMINAL HYSTERECTOMY     partial   APPENDECTOMY     BREAST LUMPECTOMY Left 2002   CHOLECYSTECTOMY  Age 24 or 56   COLONOSCOPY  2017   EYE SURGERY Left    cataract   LEFT HEART CATH AND CORONARY ANGIOGRAPHY N/A 02/09/2019   Procedure: LEFT HEART CATH AND CORONARY ANGIOGRAPHY;  Surgeon: Nelva Bush, MD;  Location: [facility];  Service: Cardiovascular;  Laterality: N/A;   TONSILLECTOMY     TOTAL HIP ARTHROPLASTY Left 02/13/2019   Procedure: TOTAL HIP ARTHROPLASTY ANTERIOR APPROACH;  Surgeon: Paralee Cancel, MD;  Location: [facility];  Service: Orthopedics;  Laterality: Left;  70 mins   UPPER  GASTROINTESTINAL ENDOSCOPY      Family History  Problem Relation Age of Onset   Colitis Mother    Irritable bowel syndrome Mother    Hypertension Father    Hyperlipidemia Sister    Hyperlipidemia Brother    Heart attack Brother    Hyperlipidemia Brother    Heart attack Brother    Hyperlipidemia Brother    Heart attack Brother    Stomach cancer Paternal Grandmother 45   Colon cancer Paternal Grandmother    Leukemia Daughter    Arthritis Daughter    Heart disease Daughter        ASD vs VSD   Esophageal cancer Neg Hx    Pancreatic cancer Neg Hx    Rectal cancer Neg Hx  Social History   Socioeconomic History   Marital status: Single    Spouse name: Not on file   Number of children: 3   Years of education: 93   Highest education level: Master's degree (e.g., MA, MS, MEng, MEd, MSW, MBA)  Occupational History   Occupation: retired    Comment: Pharmacist, hospital, kindergarten  Tobacco Use   Smoking status: Never   Smokeless tobacco: Never  Vaping Use   Vaping Use: Never used  Substance and Sexual Activity   Alcohol use: Never    Alcohol/week: 0.0 standard drinks of alcohol   Drug use: Never   Sexual activity: Not Currently    Birth control/protection: None    Comment: lives alone, avoids daiiry and gluten. 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  Other Topics Concern   Not on file  Social History Narrative   Not on file   Social Determinants of Health   Financial Resource Strain: Low Risk  (12/19/2020)   Overall Financial Resource Strain (CARDIA)    Difficulty of Paying Living Expenses: Not hard at all  Food Insecurity: No Food Insecurity (12/19/2020)   Hunger Vital Sign    Worried About Running Out of Food in the Last Year: Never true    [place] in the Last Year: Never true  Transportation Needs: No Transportation Needs (12/19/2020)   PRAPARE - Hydrologist (Medical): No    Lack of Transportation (Non-Medical): No  Physical Activity:  Insufficiently Active (12/19/2020)   Exercise Vital Sign    Days of Exercise per Week: 1 day    Minutes of Exercise per Session: 30 min  Stress: No Stress Concern Present (12/19/2020)   [place]    Feeling of Stress : Not at all  Social Connections: Not on file  Intimate Partner Violence: Not At Risk (12/19/2020)   Humiliation, Afraid, Rape, and Kick questionnaire    Fear of Current or Ex-Partner: No    Emotionally Abused: No    Physically Abused: No    Sexually Abused: No   Outpatient Medications Prior to Visit  Medication Sig Dispense Refill   ASPIRIN 81 PO 81 mg daily.      benzonatate (TESSALON) 200 MG capsule Take 1 capsule (200 mg total) by mouth 2 (two) times daily as needed for cough. 20 capsule 0   citalopram (CELEXA) 20 MG tablet Take 1 tablet (20 mg total) by mouth at bedtime. 90 tablet 1   erythromycin ophthalmic ointment SMARTSIG:In Eye(s)     fluticasone (FLOVENT HFA) 110 MCG/ACT inhaler Inhale 1-2 puffs into the lungs 2 (two) times daily as needed. 1 each 12   furosemide (LASIX) 40 MG tablet Take 1 tablet by mouth once daily 90 tablet 1   hyoscyamine (LEVSIN SL) 0.125 MG SL tablet Place 1 tablet (0.125 mg total) under the tongue 2 (two) times daily. 30 tablet 1   ipratropium (ATROVENT) 0.02 % nebulizer solution Take 2.5 mLs (0.5 mg total) by nebulization every 6 (six) hours as needed for wheezing or shortness of breath. 75 mL 12   levothyroxine (SYNTHROID) 50 MCG tablet TAKE 1 TABLET BY MOUTH ONCE DAILY BEFORE BREAKFAST 90 tablet 0   lisinopril (ZESTRIL) 10 MG tablet Take 1 tablet by mouth twice daily as needed 180 tablet 0   metoprolol succinate (TOPROL-XL) 25 MG 24 hr tablet TAKE 1 TABLET BY MOUTH ONCE DAILY AT BEDTIME 90 tablet 0   metoprolol tartrate (LOPRESSOR) 25 MG tablet  Take 1 tablet by mouth once for procedure. 1 tablet 0   NON FORMULARY as needed (wheezing, SOB). Iprasynt inhaler (unable to find  english equivalent med name)     omega-3 acid ethyl esters (LOVAZA) 1 g capsule Take 2 g by mouth daily.     omeprazole (PRILOSEC) 40 MG capsule Take 1 capsule (40 mg total) by mouth in the morning and at bedtime. Take 1 tablet by mouth once to twice daily 60 capsule 5   ondansetron (ZOFRAN) 4 MG tablet Dissolve 1 tablet under tongue once to twice a day, every day 90 tablet 3   potassium chloride (KLOR-CON M) 10 MEQ tablet Take 1 tablet by mouth once daily 90 tablet 0   sucralfate (CARAFATE) 1 g tablet Take 1 tablet by mouth twice daily 60 tablet 0   No facility-administered medications prior to visit.   Allergies  Allergen Reactions   Albuterol And Levalbuterol Palpitations    Episode of palpitations, tremor and anxiety when administered levalbuterol for PFT. Recommend avoiding this medicaiton class   Review of Systems  Constitutional:  Negative for chills and fever.  Cardiovascular:  Negative for palpitations.  Genitourinary:  Positive for urgency. Negative for hematuria.       (+) urinary incontinence (+) urinary burning      Objective:    Physical Exam Constitutional:      General: She is not in acute distress.    Appearance: Normal appearance. She is not ill-appearing.  HENT:     Head: Normocephalic and atraumatic.     Right Ear: External ear normal.     Left Ear: External ear normal.  Eyes:     Extraocular Movements: Extraocular movements intact.     Pupils: Pupils are equal, round, and reactive to light.  Cardiovascular:     Rate and Rhythm: Normal rate and regular rhythm.     Pulses: Normal pulses.     Heart sounds: Normal heart sounds. No murmur heard.    No gallop.  Pulmonary:     Effort: Pulmonary effort is normal. No respiratory distress.     Breath sounds: Normal breath sounds. No wheezing or rales.  Skin:    General: Skin is warm and dry.  Neurological:     Mental Status: She is alert and oriented to person, place, and time.  Psychiatric:        Mood and  Affect: Mood normal.        Behavior: Behavior normal.        Judgment: Judgment normal.    There were no vitals taken for this visit. Wt Readings from Last 3 Encounters:  11/06/21 119 lb (54 kg)  11/02/21 117 lb 6.4 oz (53.3 kg)  10/06/21 118 lb 12.8 oz (53.9 kg)   Diabetic Foot Exam - Simple   No data filed    Lab Results  Component Value Date   WBC 6.1 11/02/2021   HGB 10.4 (L) 11/02/2021   HCT 31.6 (L) 11/02/2021   PLT 217 11/02/2021   GLUCOSE 115 (H) 11/02/2021   CHOL 247 (H) 06/30/2021   TRIG 105.0 06/30/2021   HDL 64.10 06/30/2021   LDLCALC 162 (H) 06/30/2021   ALT 15 09/04/2021   AST 21 09/04/2021   NA 137 11/02/2021   K 4.5 11/02/2021   CL 106 11/02/2021   CREATININE 1.39 (H) 11/02/2021   BUN 36 (H) 11/02/2021   CO2 25 11/02/2021   TSH 2.37 06/30/2021   INR 1.2 02/16/2019   HGBA1C 6.3  06/30/2021   Lab Results  Component Value Date   TSH 2.37 06/30/2021   Lab Results  Component Value Date   WBC 6.1 11/02/2021   HGB 10.4 (L) 11/02/2021   HCT 31.6 (L) 11/02/2021   MCV 91.1 11/02/2021   PLT 217 11/02/2021   Lab Results  Component Value Date   NA 137 11/02/2021   K 4.5 11/02/2021   CO2 25 11/02/2021   GLUCOSE 115 (H) 11/02/2021   BUN 36 (H) 11/02/2021   CREATININE 1.39 (H) 11/02/2021   BILITOT 0.5 09/04/2021   ALKPHOS 73 08/25/2021   AST 21 09/04/2021   ALT 15 09/04/2021   PROT 7.0 09/04/2021   ALBUMIN 4.5 08/25/2021   CALCIUM 8.7 (L) 11/02/2021   ANIONGAP 6 11/02/2021   GFR 39.04 (L) 08/25/2021   Lab Results  Component Value Date   CHOL 247 (H) 06/30/2021   Lab Results  Component Value Date   HDL 64.10 06/30/2021   Lab Results  Component Value Date   LDLCALC 162 (H) 06/30/2021   Lab Results  Component Value Date   TRIG 105.0 06/30/2021   Lab Results  Component Value Date   CHOLHDL 4 06/30/2021   Lab Results  Component Value Date   HGBA1C 6.3 06/30/2021      Assessment & Plan:   Problem List Items Addressed This Visit        Other   Urinary incontinence   Back pain, chronic - Primary   No orders of the defined types were placed in this encounter.  I, [name], personally preformed the services described in this documentation.  All medical record entries made by the scribe were at my direction and in my presence.  I have reviewed the chart and discharge instructions (if applicable) and agree that the record reflects my personal performance and is accurate and complete. 12/15/2021.  I,Mohammed Iqbal,acting as a scribe for Penni Homans, MD.,have documented all relevant documentation on the behalf of Penni Homans, MD,as directed by  Penni Homans, MD while in the presence of Penni Homans, MD.  [name]

## 2021-12-15 NOTE — Patient Instructions (Signed)
[place]er[place] Matte tea do not drink  Bradycardia, Adult Bradycardia is a slower-than-normal heartbeat. A normal resting heart rate for an adult ranges from 60 to 100 beats per minute. With bradycardia, the resting heart rate is less than 60 beats per minute. Bradycardia can prevent enough oxygen from reaching certain areas of your body when you are active. It can be serious if it keeps enough oxygen from reaching your brain and other parts of your body. Bradycardia is not a problem for everyone. For some healthy adults, a slow resting heart rate is normal. What are the causes? This condition may be caused by: A problem with the heart, including: A problem with the heart's electrical system, such as a heart block. With a heart block, electrical signals between the chambers of the heart are partially or completely blocked, so they are not able to work as they should. A problem with the heart's natural pacemaker (sinus node). Heart disease. A heart attack. Heart damage. Lyme disease. A heart infection. A heart condition that is present at birth (congenital heart defect). Certain medicines that treat heart conditions. Certain conditions, such as hypothyroidism and obstructive sleep apnea. Problems with the balance of chemicals and other substances, like potassium, in the blood. Trauma. Radiation therapy. What increases the risk? You are more likely to develop this condition if you: Are age 22 or older. Have high blood pressure (hypertension), high cholesterol (hyperlipidemia), or diabetes. Drink heavily, use tobacco or nicotine products, or use drugs. What are the signs or symptoms? Symptoms of this condition include: Light-headedness. Feeling faint or fainting. Fatigue and weakness. Trouble with activity or exercise. Shortness of breath. Chest pain (angina). Drowsiness. Confusion. Dizziness. How is this diagnosed? This condition may be diagnosed based on: Your symptoms. Your medical  history. A physical exam. During the exam, your health care provider will listen to your heartbeat and check your pulse. To confirm the diagnosis, your health care provider may order tests, such as: Blood tests. An electrocardiogram (ECG). This test records the heart's electrical activity. The test can show how fast your heart is beating and whether the heartbeat is steady. A test in which you wear a portable device (event recorder or Holter monitor) to record your heart's electrical activity while you go about your day. An exercise test. How is this treated? Treatment for this condition depends on the cause of the condition and how severe your symptoms are. Treatment may involve: Treatment of the underlying condition. Changing your medicines or how much medicine you take. Having a small, battery-operated device called a pacemaker implanted under the skin. When bradycardia occurs, this device can be used to increase your heart rate and help your heart beat in a regular rhythm. Follow these instructions at home: Lifestyle Manage any health conditions that contribute to bradycardia as told by your health care provider. Follow a heart-healthy diet. A nutrition specialist (dietitian) can help educate you about healthy food options and changes. Follow an exercise program that is approved by your health care provider. Maintain a healthy weight. Try to reduce or manage your stress, such as with yoga or meditation. If you need help reducing stress, ask your health care provider. Do not use any products that contain nicotine or tobacco. These products include cigarettes, chewing tobacco, and vaping devices, such as e-cigarettes. If you need help quitting, ask your health care provider. Do not use illegal drugs. Alcohol use If you drink alcohol: Limit how much you have to: 0-1 drink a day for women who  are not pregnant. 0-2 drinks a day for men. Know how much alcohol is in a drink. In the [place] one  drink equals one 12 oz bottle of beer (355 mL), one 5 oz glass of wine (148 mL), or one 1 oz glass of hard liquor (44 mL). General instructions Take over-the-counter and prescription medicines only as told by your health care provider. Keep all follow-up visits. This is important. How is this prevented? In some cases, bradycardia may be prevented by: Treating underlying medical problems. Stopping behaviors or medicines that can trigger the condition. Contact a health care provider if: You feel light-headed or dizzy. You almost faint. You feel weak or are easily fatigued during physical activity. You experience confusion or have memory problems. Get help right away if: You faint. You have chest pains or an irregular heartbeat (palpitations). You have trouble breathing. These symptoms may represent a serious problem that is an emergency. Do not wait to see if the symptoms will go away. Get medical help right away. Call your local emergency services (911 in the [place] Do not drive yourself to the hospital. Summary Bradycardia is a slower-than-normal heartbeat. With bradycardia, the resting heart rate is less than 60 beats per minute. Treatment for this condition depends on the cause. Manage any health conditions that contribute to bradycardia as told by your health care provider. Do not use any products that contain nicotine or tobacco. These products include cigarettes, chewing tobacco, and vaping devices, such as e-cigarettes. Keep all follow-up visits. This is important. This information is not intended to replace advice given to you by your health care provider. Make sure you discuss any questions you have with your health care provider. Document Revised: 08/31/2020 Document Reviewed: 08/31/2020 Elsevier Patient Education  [place].

## 2021-12-16 LAB — URINALYSIS
Bilirubin Urine: NEGATIVE
Hgb urine dipstick: NEGATIVE
Ketones, ur: NEGATIVE
Leukocytes,Ua: NEGATIVE
Nitrite: NEGATIVE
Specific Gravity, Urine: <=1.005 — AB (ref 1.000–1.030)
Total Protein, Urine: NEGATIVE
Urine Glucose: NEGATIVE
Urobilinogen, UA: 0.2 (ref 0.0–1.0)
pH: 5.5 (ref 5.0–8.0)

## 2021-12-16 LAB — CBC
HCT: 34.2 % — ABNORMAL LOW (ref 36.0–46.0)
Hemoglobin: 11.3 g/dL — ABNORMAL LOW (ref 12.0–15.0)
MCHC: 33.2 g/dL (ref 30.0–36.0)
MCV: 91 fl (ref 78.0–100.0)
Platelets: 214 K/uL (ref 150.0–400.0)
RBC: 3.75 Mil/uL — ABNORMAL LOW (ref 3.87–5.11)
RDW: 14.4 % (ref 11.5–15.5)
WBC: 5.3 K/uL (ref 4.0–10.5)

## 2021-12-16 LAB — LIPID PANEL
Cholesterol: 304 mg/dL — ABNORMAL HIGH (ref 0–200)
HDL: 73.2 mg/dL
LDL Cholesterol: 203 mg/dL — ABNORMAL HIGH (ref 0–99)
NonHDL: 230.75
Total CHOL/HDL Ratio: 4
Triglycerides: 140 mg/dL (ref 0.0–149.0)
VLDL: 28 mg/dL (ref 0.0–40.0)

## 2021-12-16 LAB — COMPREHENSIVE METABOLIC PANEL WITH GFR
ALT: 13 U/L (ref 0–35)
AST: 22 U/L (ref 0–37)
Albumin: 4.6 g/dL (ref 3.5–5.2)
Alkaline Phosphatase: 76 U/L (ref 39–117)
BUN: 34 mg/dL — ABNORMAL HIGH (ref 6–23)
CO2: 27 meq/L (ref 19–32)
Calcium: 9.3 mg/dL (ref 8.4–10.5)
Chloride: 100 meq/L (ref 96–112)
Creatinine, Ser: 1.31 mg/dL — ABNORMAL HIGH (ref 0.40–1.20)
GFR: 38.6 mL/min — ABNORMAL LOW
Glucose, Bld: 89 mg/dL (ref 70–99)
Potassium: 4.7 meq/L (ref 3.5–5.1)
Sodium: 137 meq/L (ref 135–145)
Total Bilirubin: 0.6 mg/dL (ref 0.2–1.2)
Total Protein: 7.5 g/dL (ref 6.0–8.3)

## 2021-12-16 LAB — HEMOGLOBIN A1C: Hgb A1c MFr Bld: 6.1 % (ref 4.6–6.5)

## 2021-12-16 LAB — VITAMIN B12: Vitamin B-12: 860 pg/mL (ref 211–911)

## 2021-12-16 LAB — VITAMIN D 25 HYDROXY (VIT D DEFICIENCY, FRACTURES): VITD: 32.69 ng/mL (ref 30.00–100.00)

## 2021-12-16 LAB — TSH: TSH: 2.8 u[IU]/mL (ref 0.35–5.50)

## 2021-12-16 NOTE — Assessment & Plan Note (Signed)
Check UA and culture 

## 2021-12-16 NOTE — Assessment & Plan Note (Signed)
Supplement and monitor 

## 2021-12-16 NOTE — Assessment & Plan Note (Signed)
hgba1c acceptable, minimize simple carbs. Increase exercise as tolerated.  

## 2021-12-16 NOTE — Assessment & Plan Note (Signed)
Encourage heart healthy diet such as MIND or DASH diet, increase exercise, avoid trans fats, simple carbohydrates and processed foods, consider a krill or fish or flaxseed oil cap daily.  °

## 2021-12-16 NOTE — Assessment & Plan Note (Signed)
On Levothyroxine, continue to monitor 

## 2021-12-16 NOTE — Assessment & Plan Note (Signed)
Her symptoms are stable and she follows with cardiology.

## 2021-12-16 NOTE — Assessment & Plan Note (Signed)
Lower abdominal/pelvic pain is intermittent. She saw Dr Aundra Dubin of urogyn last month and they noted high pelvic floor pain and prescribed Valium suppositories but this sent her to the ER with SOB and palpitations. She has had no further episodes without more Diazepam.

## 2021-12-16 NOTE — Assessment & Plan Note (Signed)
Well controlled, no changes to meds. Encouraged heart healthy diet such as the DASH diet and exercise as tolerated.  °

## 2021-12-19 DIAGNOSIS — G4733 Obstructive sleep apnea (adult) (pediatric): Secondary | ICD-10-CM | POA: Diagnosis not present

## 2021-12-19 LAB — URINE CULTURE
MICRO NUMBER:: 13691253
SPECIMEN QUALITY:: ADEQUATE

## 2021-12-20 ENCOUNTER — Other Ambulatory Visit: Payer: Self-pay | Admitting: Family Medicine

## 2021-12-20 ENCOUNTER — Encounter: Payer: Self-pay | Admitting: Family Medicine

## 2021-12-20 MED ORDER — NITROFURANTOIN MONOHYD MACRO 100 MG PO CAPS: 100.0000 mg | ORAL_CAPSULE | Freq: Two times a day (BID) | ORAL | 0 refills | Status: DC

## 2021-12-20 NOTE — Progress Notes (Unsigned)
acrobid

## 2021-12-24 ENCOUNTER — Ambulatory Visit: Payer: Medicare HMO

## 2021-12-24 DIAGNOSIS — M5416 Radiculopathy, lumbar region: Secondary | ICD-10-CM | POA: Diagnosis not present

## 2021-12-24 NOTE — Progress Notes (Signed)
Patient here for one week follow up as indicated on last office visit: 1 week bp and pulse check.  BP Readings from Last 3 Encounters:  12/15/21 126/76  11/26/21 (!) 116/59  11/06/21 110/60   Pulse was low last ov at 44 bpm.  BP today is 133 68  With a pulse of 50.  Patient denies feeling dizzy or light headed. Pule goes up with activity.  Ok per Dr. Etter Sjogren, patient advised ok to follow up on next scheduled appointment and to seek in person evaluation if she starts having symptoms at any time.

## 2021-12-25 ENCOUNTER — Ambulatory Visit: Payer: Medicare HMO | Admitting: Pulmonary Disease

## 2021-12-25 ENCOUNTER — Encounter: Payer: Self-pay | Admitting: Pulmonary Disease

## 2021-12-25 VITALS — BP 116/68 | HR 44 | Ht <= 58 in | Wt 116.0 lb

## 2021-12-25 DIAGNOSIS — J4520 Mild intermittent asthma, uncomplicated: Secondary | ICD-10-CM | POA: Diagnosis not present

## 2021-12-25 DIAGNOSIS — R001 Bradycardia, unspecified: Secondary | ICD-10-CM | POA: Diagnosis not present

## 2021-12-25 NOTE — Progress Notes (Signed)
Synopsis: Referred in October 2022 for evaluation of asthma by Penni Homans  Subjective:   PATIENT ID: [name] GENDER: female DOB: 01-10-1941, MRN: [id number]  HPI  Chief Complaint  Patient presents with   Follow-up    Patient will be travelling out of the country for 6 wks. States she has been coughing more, non productive cough.    Shalia Bartko is a 81 year old woman, never smoker with history of hypertension, congestive heart failure, coronary artery disease and asthma who returns to pulmonary clinic for asthma.   She reports her breathing has been stable since last visit. She reports left sided back pain and had injection yesterday which her pain is improved.   She reports having low heart rate over the past couple of months. She remains on metoprolol succinate '25mg'$  nightly.   She is planning to go to [place] for 6 weeks to see family.   OV 08/24/21 She reports 3 weeks ago she was having chest pains and then developed pain in her back as well. She saw her primary care doctor. No issues on X-ray, then had HRCT chest on 08/12/21 which showed air trapping concerning for small airways disease and some bland basilar scarring. She then started using her flovent inhaler 2 puffs twice daily with improvement of the chest pressure and shortness of breath.  She currently denies any symptoms.  OV 03/30/2021 She had pulmonary function tests on 11/2 which were within normal limits but she had reaction to the levalbuterol medication with increased anxiety, tremors, chest tightness and palpitations. This resolved with time.   She has no further complaints since last visit.   OV 03/05/21 She reports having cough for 4 months that has been treated with steroids and antibiotics with complete resolution. She does have some intermittent wheezing and exertional dyspnea. She denies seasonal allergies that affect her breathing. Cold air and cigarette smoke bother her breathing. She has an as needed  albuterol inhaler but this causes her to shake and feel jittery.   Cough for 4 months. Several rounds of steroids and antibiotics.   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 lumpectomy, radtiation x 30   Breast cancer in female [facility])    Bursitis of left hip 09/04/2018   Change in bowel habits 02/22/2018   Change in mole 06/08/2016   Chest pain 03/04/2020   CHF (congestive heart failure) (HCC)    Chronic bilateral thoracic back pain 05/23/2016   Chronic diastolic congestive heart failure ([place]) 05/28/2019   Colitis    Complication of anesthesia    VERY SENSITIVE TO ANESTHESIA    Constipation 01/17/2018   Coronary artery disease involving native coronary artery of native heart without angina pectoris 05/28/2019   Cortical age-related cataract of right eye 04/18/2018   Cough variant asthma  vs UACS from ACEi     Spirometry 12/02/2016  FEV1 1.26 (76%)  Ratio 78 with min curvature  p last saba > 6 h prior  - trial off acei 12/02/2016  - 12/02/2016  After extensive coaching HFA effectiveness =    75% with qvar autohaler > rechallenge with 80 2bid     Decreased visual acuity 01/16/2017   Degeneration of lumbar intervertebral disc 08/01/2018   Diarrhea 11/16/2015   D/o Diverticuli, polyps, celiac disease.     Dyspnea 11/04/2016   with asthma exacerbation only   Dysuria 11/04/2016   Elevated sed rate 10/23/2017   Essential hypertension    Trial off acei  12/02/2016 due to cough/ pseudoasthma   GERD (gastroesophageal reflux disease)    Gluten intolerance    History of chicken pox    History of Helicobacter pylori infection 08/01/2015   History of rectal bleeding 06/01/2019   Hx of LASIK 04/24/2019   Hyperglycemia 11/07/2016   Hyperlipidemia    Hypertension    Hyponatremia 02/16/2019   Hypothyroidism    IBS (irritable bowel syndrome)    Lattice degeneration of right retina 04/24/2019   Left hand pain 05/07/2018   Left hip pain 03/01/2017   Left leg pain 03/01/2017   Left shoulder pain 05/07/2018   Low back pain 08/10/2015   LUQ pain 06/01/2019   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   Neck pain 03/01/2017   Nuclear sclerotic cataract of right eye 04/18/2018   Osteoporosis    Overweight (BMI 25.0-29.9) 02/14/2019   Pain in joint of left shoulder 08/11/2018   Paroxysmal atrial tachycardia ([place]) 05/28/2019   Pelvic pain 07/02/2018   Personal history of radiation therapy    Pneumonia    PONV (postoperative nausea and vomiting)    Poor appetite 02/22/2018   Pseudophakia of left eye 04/18/2018   PVD (posterior vitreous detachment), right 04/24/2019   Restless sleeper 11/07/2016   Right hip pain 10/23/2017   RLQ discomfort 03/01/2017   RLS (restless legs syndrome) 05/07/2019   S/P left THA, AA 02/13/2019   Sepsis due to pneumonia ([place]) 02/16/2019   Sleep apnea    uses CPAP   Stomach cramps    Tremor 02/15/2016   Urinary frequency 12/22/2017   Urinary incontinence 05/07/2019   Urinary tract infection 11/04/2016   Vitamin D deficiency 04/29/2016     Family History  Problem Relation Age of Onset   Colitis Mother    Irritable bowel syndrome Mother    Hypertension Father    Hyperlipidemia Sister    Hyperlipidemia Brother    Heart attack Brother    Hyperlipidemia Brother    Heart attack Brother    Hyperlipidemia Brother    Heart attack Brother    Stomach cancer Paternal  Grandmother 85   Colon cancer Paternal Grandmother    Leukemia Daughter    Arthritis Daughter    Heart disease Daughter        ASD vs VSD   Esophageal cancer Neg Hx    Pancreatic cancer Neg Hx    Rectal cancer Neg Hx      Social History   Socioeconomic History   Marital status: Single    Spouse name: Not on file   Number of children: 3   Years of education: 18   Highest education level: Master's degree (e.g., MA, MS, MEng, MEd, MSW, MBA)  Occupational History   Occupation: retired    Comment: Pharmacist, hospital, kindergarten  Tobacco Use   Smoking status: Never   Smokeless tobacco: Never  Vaping Use   Vaping Use: Never used  Substance and  Sexual Activity   Alcohol use: Never    Alcohol/week: 0.0 standard drinks of alcohol   Drug use: Never   Sexual activity: Not Currently    Birth control/protection: None    Comment: lives alone, avoids daiiry and gluten. 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  Other Topics Concern   Not on file  Social History Narrative   Not on file   Social Determinants of Health   Financial Resource Strain: Low Risk  (12/19/2020)   Overall Financial Resource Strain (CARDIA)    Difficulty of Paying Living Expenses: Not hard at all  Food Insecurity: No Food Insecurity (12/19/2020)   Hunger Vital Sign    Worried About Running Out of Food in the Last Year: Never true    [place] in the Last Year: Never true  Transportation Needs: No Transportation Needs (12/19/2020)   PRAPARE - Hydrologist (Medical): No    Lack of Transportation (Non-Medical): No  Physical Activity: Insufficiently Active (12/19/2020)   Exercise Vital Sign    Days of Exercise per Week: 1 day    Minutes of Exercise per Session: 30 min  Stress: No Stress Concern Present (12/19/2020)   [place]    Feeling of Stress : Not at all  Social Connections: Not on file  Intimate Partner Violence: Not At Risk  (12/19/2020)   Humiliation, Afraid, Rape, and Kick questionnaire    Fear of Current or Ex-Partner: No    Emotionally Abused: No    Physically Abused: No    Sexually Abused: No     Allergies  Allergen Reactions   Valium [Diazepam] Shortness Of Breath    Sob and bradycardia   Albuterol And Levalbuterol Palpitations    Episode of palpitations, tremor and anxiety when administered levalbuterol for PFT. Recommend avoiding this medicaiton class     Outpatient Medications Prior to Visit  Medication Sig Dispense Refill   ASPIRIN 81 PO 81 mg daily.      citalopram (CELEXA) 20 MG tablet Take 1 tablet (20 mg total) by mouth at bedtime. 90 tablet 1   fexofenadine (ALLEGRA ALLERGY) 180 MG tablet Take 1 tablet (180 mg total) by mouth daily as needed for allergies or rhinitis.     fluticasone (FLOVENT HFA) 110 MCG/ACT inhaler Inhale 1-2 puffs into the lungs 2 (two) times daily as needed. 1 each 12   furosemide (LASIX) 40 MG tablet Take 1 tablet by mouth once daily 90 tablet 0   hyoscyamine (LEVSIN SL) 0.125 MG SL tablet Place 1 tablet (0.125 mg total) under the tongue 2 (two) times daily. 30 tablet 1   ipratropium (ATROVENT) 0.02 % nebulizer solution Take 2.5 mLs (0.5 mg total) by nebulization every 6 (six) hours as needed for wheezing or shortness of breath. 75 mL 12   levothyroxine (SYNTHROID) 50 MCG tablet TAKE 1 TABLET BY MOUTH ONCE DAILY BEFORE BREAKFAST 90 tablet 0   lisinopril (ZESTRIL) 10 MG tablet Take 1 tablet by mouth twice daily as needed 180 tablet 0   metoprolol succinate (TOPROL-XL) 25 MG 24 hr tablet TAKE 1 TABLET BY MOUTH ONCE DAILY AT BEDTIME 90 tablet 0   nitrofurantoin, macrocrystal-monohydrate, (MACROBID) 100 MG capsule Take 1 capsule (100 mg total) by mouth 2 (two) times daily. 14 capsule 0   NON FORMULARY as needed (wheezing, SOB). Iprasynt inhaler (unable to find english equivalent med name)     omega-3 acid ethyl esters (LOVAZA) 1 g capsule Take  2 g by mouth daily.      omeprazole (PRILOSEC) 40 MG capsule Take 1 capsule (40 mg total) by mouth in the morning and at bedtime. Take 1 tablet by mouth once to twice daily 60 capsule 5   ondansetron (ZOFRAN) 4 MG tablet Dissolve 1 tablet under tongue once to twice a day, every day 90 tablet 3   potassium chloride (KLOR-CON M) 10 MEQ tablet Take 1 tablet by mouth once daily 90 tablet 0   sucralfate (CARAFATE) 1 g tablet Take 1 tablet by mouth twice daily 60 tablet 0   No facility-administered medications prior to visit.    Review of Systems  Constitutional:  Negative for chills, fever, malaise/fatigue and weight loss.  HENT:  Negative for congestion, sinus pain and sore throat.   Eyes: Negative.   Respiratory:  Negative for cough, hemoptysis, sputum production, shortness of breath and wheezing.   Cardiovascular:  Negative for chest pain, palpitations, orthopnea, claudication and leg swelling.  Gastrointestinal:  Negative for abdominal pain, heartburn, nausea and vomiting.  Genitourinary: Negative.   Musculoskeletal:  Positive for back pain. Negative for joint pain and myalgias.  Skin:  Negative for rash.  Neurological:  Negative for weakness.  Endo/Heme/Allergies: Negative.   Psychiatric/Behavioral:  The patient is not nervous/anxious.     Objective:   Vitals:   12/25/21 1035  BP: 116/68  Pulse: (!) 44  SpO2: 98%  Weight: 116 lb (52.6 kg)  Height: '4\' 10"'$  (1.473 m)   Physical Exam Constitutional:      General: She is not in acute distress.    Appearance: She is not ill-appearing.  HENT:     Head: Normocephalic and atraumatic.  Eyes:     General: No scleral icterus. Cardiovascular:     Rate and Rhythm: Normal rate and regular rhythm.     Pulses: Normal pulses.     Heart sounds: Normal heart sounds. No murmur heard. Pulmonary:     Effort: Pulmonary effort is normal.     Breath sounds: Normal breath sounds. No wheezing, rhonchi or rales.  Musculoskeletal:     Right lower leg: No edema.     Left  lower leg: No edema.  Skin:    General: Skin is warm and dry.  Neurological:     General: No focal deficit present.     Mental Status: She is alert.    CBC    Component Value Date/Time   WBC 5.3 12/15/2021 1239   RBC 3.75 (L) 12/15/2021 1239   HGB 11.3 (L) 12/15/2021 1239   HGB 11.0 (L) 02/05/2019 1058   HCT 34.2 (L) 12/15/2021 1239   HCT 33.7 (L) 02/05/2019 1058   PLT 214.0 12/15/2021 1239   PLT 227 02/05/2019 1058   MCV 91.0 12/15/2021 1239   MCV 89 02/05/2019 1058   MCH 30.0 11/02/2021 1540   MCHC 33.2 12/15/2021 1239   RDW 14.4 12/15/2021 1239   RDW 14.0 02/05/2019 1058   LYMPHSABS 2.2 11/02/2021 1540   MONOABS 0.7 11/02/2021 1540   EOSABS 0.0 11/02/2021 1540   BASOSABS 0.0 11/02/2021 1540      Latest Ref Rng & Units 12/15/2021   12:39 PM 11/02/2021    3:40 PM 09/04/2021    3:58 PM  BMP  Glucose 70 - 99 mg/dL 89  115  102   BUN 6 - 23 mg/dL 34  36  34   Creatinine 0.40 - 1.20 mg/dL 1.31  1.39  1.39   BUN/Creat Ratio 6 -  22 (calc)   24   Sodium 135 - 145 mEq/L 137  137  136   Potassium 3.5 - 5.1 mEq/L 4.7  4.5  4.4   Chloride 96 - 112 mEq/L 100  106  102   CO2 19 - 32 mEq/L '27  25  25   '$ Calcium 8.4 - 10.5 mg/dL 9.3  8.7  9.0    Chest imaging: HRCT Chest 08/12/21 1. Predominantly bandlike, bland appearing post infectious or inflammatory scarring of the bilateral lung bases. No evidence of fibrotic interstitial lung disease. 2. Lobular air trapping on expiratory phase imaging, consistent with small airways disease. This mosaic attenuation likely explains ground-glass opacity reported on prior CT angiogram, performed in expiratory phase. 3. Coronary artery disease. 4. Moderate hiatal hernia.  CTA Chest 12/23/20 Negative for significant acute pulmonary embolus by CTA. No other acute intrathoracic vascular finding. Diffuse bilateral patchy ground-glass opacities, nonspecific. See above comment. Moderate hiatal hernia Aortic Atherosclerosis  PFT:    Latest  Ref Rng & Units 03/25/2021    3:58 PM  PFT Results  FVC-Pre L 1.64   FVC-Predicted Pre % 94   FVC-Post L 1.68   FVC-Predicted Post % 96   Pre FEV1/FVC % % 84   Post FEV1/FCV % % 88   FEV1-Pre L 1.38   FEV1-Predicted Pre % 108   FEV1-Post L 1.47   DLCO uncorrected ml/min/mmHg 10.75   DLCO UNC% % 76   DLCO corrected ml/min/mmHg 11.62   DLCO COR %Predicted % 82   DLVA Predicted % 99   TLC L 3.14   TLC % Predicted % 81   RV % Predicted % 70   PFT 2022 is within normal limits.   Spirometry 2018 FVC: 1.6L (72%) FEV1: 1.2L (76%) FEV1/FVC 78%  Labs:  Path:  Echo 01/13/21: LV EF 60-65%. LV diastolic parameters are normal. RV systolic function is normal. RV size is normal.  No valvular issues.   Heart Catheterization:  Assessment & Plan:   Mild intermittent asthma without complication  Bradycardia  Discussion: Empress Newmann is a 81 year old woman, never smoker with history of hypertension, congestive heart failure, coronary artery disease and asthma who returns to pulmonary clinic for asthma.   She has mild intermittent asthma. She is to continue flovent 111mg inhaler 2 puffs twice daily during symptomatic periods. If she continues to have increased symptoms during allergy season we can consider adding montelukast in the future.   She is intolerant of albuterol or xopenex due to tachycardia and jitteriness.  She is bradycardic on exam today. She is overall asymptomatic but will rarely have dizziness/lightheadedness.   Follow up in 6 months.  JFreda Jackson MD [facility] Office: [phone]  Current Outpatient Medications:    ASPIRIN 81 PO, 81 mg daily. , Disp: , Rfl:    citalopram (CELEXA) 20 MG tablet, Take 1 tablet (20 mg total) by mouth at bedtime., Disp: 90 tablet, Rfl: 1   fexofenadine (ALLEGRA ALLERGY) 180 MG tablet, Take 1 tablet (180 mg total) by mouth daily as needed for allergies or rhinitis., Disp: , Rfl:    fluticasone (FLOVENT  HFA) 110 MCG/ACT inhaler, Inhale 1-2 puffs into the lungs 2 (two) times daily as needed., Disp: 1 each, Rfl: 12   furosemide (LASIX) 40 MG tablet, Take 1 tablet by mouth once daily, Disp: 90 tablet, Rfl: 0   hyoscyamine (LEVSIN SL) 0.125 MG SL tablet, Place 1 tablet (0.125 mg total) under the tongue 2 (  two) times daily., Disp: 30 tablet, Rfl: 1   ipratropium (ATROVENT) 0.02 % nebulizer solution, Take 2.5 mLs (0.5 mg total) by nebulization every 6 (six) hours as needed for wheezing or shortness of breath., Disp: 75 mL, Rfl: 12   levothyroxine (SYNTHROID) 50 MCG tablet, TAKE 1 TABLET BY MOUTH ONCE DAILY BEFORE BREAKFAST, Disp: 90 tablet, Rfl: 0   lisinopril (ZESTRIL) 10 MG tablet, Take 1 tablet by mouth twice daily as needed, Disp: 180 tablet, Rfl: 0   metoprolol succinate (TOPROL-XL) 25 MG 24 hr tablet, TAKE 1 TABLET BY MOUTH ONCE DAILY AT BEDTIME, Disp: 90 tablet, Rfl: 0   nitrofurantoin, macrocrystal-monohydrate, (MACROBID) 100 MG capsule, Take 1 capsule (100 mg total) by mouth 2 (two) times daily., Disp: 14 capsule, Rfl: 0   NON FORMULARY, as needed (wheezing, SOB). Iprasynt inhaler (unable to find english equivalent med name), Disp: , Rfl:    omega-3 acid ethyl esters (LOVAZA) 1 g capsule, Take 2 g by mouth daily., Disp: , Rfl:    omeprazole (PRILOSEC) 40 MG capsule, Take 1 capsule (40 mg total) by mouth in the morning and at bedtime. Take 1 tablet by mouth once to twice daily, Disp: 60 capsule, Rfl: 5   ondansetron (ZOFRAN) 4 MG tablet, Dissolve 1 tablet under tongue once to twice a day, every day, Disp: 90 tablet, Rfl: 3   potassium chloride (KLOR-CON M) 10 MEQ tablet, Take 1 tablet by mouth once daily, Disp: 90 tablet, Rfl: 0   sucralfate (CARAFATE) 1 g tablet, Take 1 tablet by mouth twice daily, Disp: 60 tablet, Rfl: 0

## 2021-12-25 NOTE — Patient Instructions (Addendum)
Continue flovent 2 puffs twice daily for your asthma symptoms - rinse mouth out after each use.    Continue allegra daily for allergies  Schedule a sleep medicine appointment for when you get back from vacation for your sleep apnea  Follow up in 6 months

## 2021-12-28 ENCOUNTER — Telehealth: Payer: Self-pay

## 2021-12-28 NOTE — Telephone Encounter (Signed)
[name], MD  Freddi Starr, MD; Gerilyn Nestle, RN Thanks for letting me know. I will have my teammate, our office RN reach out to her and have her stop her Metoprolol for about 2 weeks and then come in for a nurse visit for pulse and blood pressure check. Regards, Cana with patient regarding medication change and NV. Patient will hold Metoprolol x 2 weeks.  She is currently in [place] until 02/06/22. She is scheduled for a NV on 02/09/22.  Advised patient to monitor HR and BP at home at least 2/week and call with abnormal results. Also instructed to let provider know if she becomes lightheaded or dizzy, to report to ER if severe.  Patient verbalized understanding.

## 2021-12-30 ENCOUNTER — Ambulatory Visit: Payer: Medicare HMO | Admitting: Obstetrics and Gynecology

## 2021-12-30 NOTE — Progress Notes (Deleted)
[facility] New Patient Evaluation and Consultation  Referring Provider: [name], MD PCP: [name], MD Date of Service: 12/30/2021  SUBJECTIVE Chief Complaint: No chief complaint on file.  History of Present Illness: Julie Wilkerson is a 81 y.o. White or Caucasian female seen in consultation at the request of Dr. Charlett Blake for evaluation of incontinence.    Review of records significant for: Seen by Dr Aundra Dubin at [facility]. Has urgency incontinence and recurrent UTI. Was prescribed vaginal estrogen. Prescribed pelvic PT and valium for high tone pelvic floor.   Urinary Symptoms: {urine leakage?:24754} Leaks *** time(s) per {days/wks/mos/yrs:310907}.  Pad use: {NUMBERS 1-10:18281} {pad option:24752} per day.   She {ACTION; IS/IS ZOX:09604540} bothered by her UI symptoms.  Day time voids ***.  Nocturia: *** times per night to void. Voiding dysfunction: she {empties:24755} her bladder well.  {DOES NOT does:27190::"does not"} use a catheter to empty bladder.  When urinating, she feels {urine symptoms:24756} Drinks: *** per day  UTIs: {NUMBERS 1-10:18281} UTI's in the last year.   {ACTIONS;DENIES/REPORTS:21021675::"Denies"} history of {urologic concerns:24757}  Pelvic Organ Prolapse Symptoms:                  She {denies/ admits to:24761} a feeling of a bulge the vaginal area. It has been present for {NUMBER 1-10:22536} {days/wks/mos/yrs:310907}.  She {denies/ admits to:24761} seeing a bulge.  This bulge {ACTION; IS/IS JWJ:19147829} bothersome.  Bowel Symptom: Bowel movements: *** time(s) per {Time; day/week/month:13537} Stool consistency: {stool consistency:24758} Straining: {yes/no:19897}.  Splinting: {yes/no:19897}.  Incomplete evacuation: {yes/no:19897}.  She {denies/ admits to:24761} accidental bowel leakage / fecal incontinence  Occurs: *** time(s) per {Time; day/week/month:13537}  Consistency with leakage: {stool consistency:24758} Bowel regimen:  {bowel regimen:24759} Last colonoscopy: Date ***, Results ***  Sexual Function Sexually active: {yes/no:19897}.  Sexual orientation: {Sexual [phone]} Pain with sex: {pain with sex:24762}  Pelvic Pain {denies/ admits to:24761} pelvic pain Location: *** Pain occurs: *** Prior pain treatment: *** Improved by: *** Worsened by: ***   Past Medical History:  Past Medical History:  Diagnosis Date   Abdominal pain 11/07/2016   Accelerating angina ([place]) 56/21/3086   Acute diastolic CHF (congestive heart failure) ([place]) 02/16/2019   Acute on chronic diastolic CHF (congestive heart failure) ([place]) 02/23/2019   Acute pansinusitis 04/26/2017   Amblyopia of eye, left 04/19/2018   Anemia    Anginal pain (HCC)    Anxiety    Arthritis    Asthma    Atypical chest pain 02/04/2019   Bilateral carotid bruits 03/28/2017   Blood transfusion without reported diagnosis    Breast cancer ([place])    2002, left, encapsulated, microcalcifications. lumpectomy, radtiation x 30   Breast cancer in female [facility])    Bursitis of left hip 09/04/2018   Change in bowel habits 02/22/2018   Change in mole 06/08/2016   Chest pain 03/04/2020   CHF (congestive heart failure) (HCC)    Chronic bilateral thoracic back pain 05/23/2016   Chronic diastolic congestive heart failure ([place]) 05/28/2019   Colitis    Complication of anesthesia    VERY SENSITIVE TO ANESTHESIA    Constipation 01/17/2018   Coronary artery disease involving native coronary artery of native heart without angina pectoris 05/28/2019   Cortical age-related cataract of right eye 04/18/2018   Cough variant asthma  vs UACS from ACEi     Spirometry 12/02/2016  FEV1 1.26 (76%)  Ratio 78 with min curvature p last saba > 6 h prior  - trial off acei 12/02/2016  -  12/02/2016  After extensive coaching HFA effectiveness =    75% with qvar autohaler > rechallenge with 80 2bid     Decreased visual acuity 01/16/2017   Degeneration of lumbar  intervertebral disc 08/01/2018   Diarrhea 11/16/2015   D/o Diverticuli, polyps, celiac disease.     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   Left hand pain 05/07/2018   Left hip pain 03/01/2017   Left leg pain 03/01/2017   Left shoulder pain 05/07/2018   Low back pain 08/10/2015   LUQ pain 06/01/2019   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   Neck pain 03/01/2017   Nuclear sclerotic cataract of right eye 04/18/2018   Osteoporosis    Overweight (BMI 25.0-29.9) 02/14/2019   Pain in joint of left shoulder 08/11/2018   Paroxysmal atrial tachycardia ([place]) 05/28/2019   Pelvic pain 07/02/2018   Personal history of radiation therapy    Pneumonia    PONV (postoperative nausea and vomiting)    Poor appetite 02/22/2018   Pseudophakia of left eye 04/18/2018   PVD (posterior vitreous detachment), right 04/24/2019   Restless sleeper 11/07/2016   Right hip pain 10/23/2017   RLQ discomfort 03/01/2017   RLS (restless legs syndrome) 05/07/2019   S/P left THA, AA 02/13/2019   Sepsis due to pneumonia ([place]) 02/16/2019   Sleep apnea    uses CPAP   Stomach cramps    Tremor 02/15/2016   Urinary frequency 12/22/2017   Urinary incontinence 05/07/2019   Urinary tract infection 11/04/2016   Vitamin D deficiency 04/29/2016     Past Surgical History:   Past Surgical History:  Procedure Laterality Date    ABDOMINAL HYSTERECTOMY     partial   APPENDECTOMY     BREAST LUMPECTOMY Left 2002   CHOLECYSTECTOMY  Age 19 or 26   COLONOSCOPY  2017   EYE SURGERY Left    cataract   LEFT HEART CATH AND CORONARY ANGIOGRAPHY N/A 02/09/2019   Procedure: LEFT HEART CATH AND CORONARY ANGIOGRAPHY;  Surgeon: Nelva Bush, MD;  Location: [facility];  Service: Cardiovascular;  Laterality: N/A;   TONSILLECTOMY     TOTAL HIP ARTHROPLASTY Left 02/13/2019   Procedure: TOTAL HIP ARTHROPLASTY ANTERIOR APPROACH;  Surgeon: Paralee Cancel, MD;  Location: [facility];  Service: Orthopedics;  Laterality: Left;  70 mins   UPPER GASTROINTESTINAL ENDOSCOPY       Past OB/GYN History: G{NUMBERS 1-10:18281} P{NUMBERS 1-10:18281} Vaginal deliveries: ***,  Forceps/ Vacuum deliveries: ***, Cesarean section: *** Menopausal: {menopausal:24763} Contraception: ***. Last pap smear was ***.  Any history of abnormal pap smears: {yes/no:19897}.   Medications: She has a current medication list which includes the following prescription(s): aspirin, citalopram, fexofenadine, fluticasone, furosemide, hyoscyamine, ipratropium, levothyroxine, lisinopril, metoprolol succinate, nitrofurantoin (macrocrystal-monohydrate), NON FORMULARY, omega-3 acid ethyl esters, omeprazole, ondansetron, potassium chloride, and sucralfate.   Allergies: Patient is allergic to valium [diazepam] and albuterol and  levalbuterol.   Social History:  Social History   Tobacco Use   Smoking status: Never   Smokeless tobacco: Never  Vaping Use   Vaping Use: Never used  Substance Use Topics   Alcohol use: Never    Alcohol/week: 0.0 standard drinks of alcohol   Drug use: Never    Relationship status: {relationship status:24764} She lives with ***.   She {ACTION; IS/IS FUX:32355732} employed ***. Regular exercise: {Yes/No:304960894} History of abuse: {Yes/No:304960894}  Family History:   Family History  Problem Relation Age of Onset   Colitis Mother     Irritable bowel syndrome Mother    Hypertension Father    Hyperlipidemia Sister    Hyperlipidemia Brother    Heart attack Brother    Hyperlipidemia Brother    Heart attack Brother    Hyperlipidemia Brother    Heart attack Brother    Stomach cancer Paternal Grandmother 17   Colon cancer Paternal Grandmother    Leukemia Daughter    Arthritis Daughter    Heart disease Daughter        ASD vs VSD   Esophageal cancer Neg Hx    Pancreatic cancer Neg Hx    Rectal cancer Neg Hx      Review of Systems: ROS   OBJECTIVE Physical Exam: There were no vitals filed for this visit.  Physical Exam   GU / Detailed Urogynecologic Evaluation:  Pelvic Exam: Normal external female genitalia; Bartholin's and Skene's glands normal in appearance; urethral meatus normal in appearance, no urethral masses or discharge.   CST: {gen negative/positive:315881}  Reflexes: bulbocavernosis {DESC; PRESENT/NOT PRESENT:21021351}, anocutaneous {DESC; PRESENT/NOT PRESENT:21021351} ***bilaterally.  Speculum exam reveals normal vaginal mucosa {With/Without:20273} atrophy. Cervix {exam; gyn cervix:30847}. Uterus {exam; pelvic uterus:30849}. Adnexa {exam; adnexa:12223}.    s/p hysterectomy: Speculum exam reveals normal vaginal mucosa {With/Without:20273}  atrophy and normal vaginal cuff.  Adnexa {exam; adnexa:12223}.    With apex supported, anterior compartment defect was {reduced:24765}  Pelvic floor strength {Roman # I-V:19040}/V, puborectalis {Roman # I-V:19040}/V external anal sphincter {Roman # I-V:19040}/V  Pelvic floor musculature: Right levator {Tender/Non-tender:20250}, Right obturator {Tender/Non-tender:20250}, Left levator {Tender/Non-tender:20250}, Left obturator {Tender/Non-tender:20250}  POP-Q:   POP-Q                                               Aa                                               Ba                                                 C                                                 Gh  Pb                                               tvl                                                Ap                                               Bp                                                 D     Rectal Exam:  Normal sphincter tone, {rectocele:24766} distal rectocele, enterocoele {DESC; PRESENT/NOT PRESENT:21021351}, no rectal masses, {sign of:24767} dyssynergia when asking the patient to bear down.  Post-Void Residual (PVR) by Bladder Scan: In order to evaluate bladder emptying, we discussed obtaining a postvoid residual and she agreed to this procedure.  Procedure: The ultrasound unit was placed on the patient's abdomen in the suprapubic region after the patient had voided. A PVR of *** ml was obtained by bladder scan.  Laboratory Results: '@ENCLABS'$ @   ***I visualized the urine specimen, noting the specimen to be {urine color:24768}  ASSESSMENT AND PLAN Julie Wilkerson is a 81 y.o. with: No diagnosis found.    Jaquita Folds, MD   Medical Decision Making:  - Reviewed/ ordered a clinical laboratory test - Reviewed/ ordered a radiologic study - Reviewed/ ordered medicine test - Decision to obtain old records - Discussion of management of or test interpretation with an external physician / other healthcare professional  - Assessment requiring independent historian - Review and summation of prior records - Independent review of image, tracing or specimen

## 2022-01-03 DIAGNOSIS — R69 Illness, unspecified: Secondary | ICD-10-CM | POA: Diagnosis not present

## 2022-01-04 DIAGNOSIS — R69 Illness, unspecified: Secondary | ICD-10-CM | POA: Diagnosis not present

## 2022-01-13 DIAGNOSIS — R69 Illness, unspecified: Secondary | ICD-10-CM | POA: Diagnosis not present

## 2022-01-19 DIAGNOSIS — R69 Illness, unspecified: Secondary | ICD-10-CM | POA: Diagnosis not present

## 2022-01-19 DIAGNOSIS — G4733 Obstructive sleep apnea (adult) (pediatric): Secondary | ICD-10-CM | POA: Diagnosis not present

## 2022-01-28 ENCOUNTER — Ambulatory Visit: Payer: Medicare HMO

## 2022-02-04 ENCOUNTER — Ambulatory Visit (INDEPENDENT_AMBULATORY_CARE_PROVIDER_SITE_OTHER): Payer: Medicare HMO | Admitting: Medical

## 2022-02-04 ENCOUNTER — Ambulatory Visit (HOSPITAL_BASED_OUTPATIENT_CLINIC_OR_DEPARTMENT_OTHER)
Admission: RE | Admit: 2022-02-04 | Discharge: 2022-02-04 | Disposition: A | Discharge status: Home / Self Care | Payer: Medicare HMO | Source: Ambulatory Visit | Attending: Medical | Admitting: Medical

## 2022-02-04 VITALS — BP 130/60 | HR 80 | Temp 98.5°F | Resp 18 | Ht <= 58 in | Wt 116.0 lb

## 2022-02-04 DIAGNOSIS — M2550 Pain in unspecified joint: Secondary | ICD-10-CM

## 2022-02-04 DIAGNOSIS — R5383 Other fatigue: Secondary | ICD-10-CM | POA: Diagnosis not present

## 2022-02-04 DIAGNOSIS — Z8616 Personal history of COVID-19: Secondary | ICD-10-CM | POA: Diagnosis not present

## 2022-02-04 DIAGNOSIS — R059 Cough, unspecified: Secondary | ICD-10-CM | POA: Insufficient documentation

## 2022-02-04 DIAGNOSIS — M791 Myalgia, unspecified site: Secondary | ICD-10-CM | POA: Diagnosis not present

## 2022-02-04 LAB — COMPREHENSIVE METABOLIC PANEL
ALT: 16 U/L (ref 0–35)
AST: 20 U/L (ref 0–37)
Albumin: 3.8 g/dL (ref 3.5–5.2)
Alkaline Phosphatase: 69 U/L (ref 39–117)
BUN: 35 mg/dL — ABNORMAL HIGH (ref 6–23)
CO2: 25 mEq/L (ref 19–32)
Calcium: 8.7 mg/dL (ref 8.4–10.5)
Chloride: 105 mEq/L (ref 96–112)
Creatinine, Ser: 1.31 mg/dL — ABNORMAL HIGH (ref 0.40–1.20)
GFR: 38.56 mL/min — ABNORMAL LOW (ref >60.00)
Glucose, Bld: 97 mg/dL (ref 70–99)
Potassium: 4.3 mEq/L (ref 3.5–5.1)
Sodium: 140 mEq/L (ref 135–145)
Total Bilirubin: 0.5 mg/dL (ref 0.2–1.2)
Total Protein: 6.7 g/dL (ref 6.0–8.3)

## 2022-02-04 LAB — CBC WITH DIFFERENTIAL/PLATELET
Basophils Absolute: 0 10*3/uL (ref 0.0–0.1)
Basophils Relative: 0.4 % (ref 0.0–3.0)
Eosinophils Absolute: 0 10*3/uL (ref 0.0–0.7)
Eosinophils Relative: 0 % (ref 0.0–5.0)
HCT: 32.9 % — ABNORMAL LOW (ref 36.0–46.0)
Hemoglobin: 11 g/dL — ABNORMAL LOW (ref 12.0–15.0)
Lymphocytes Relative: 35.2 % (ref 12.0–46.0)
Lymphs Abs: 1.5 10*3/uL (ref 0.7–4.0)
MCHC: 33.4 g/dL (ref 30.0–36.0)
MCV: 88.6 fl (ref 78.0–100.0)
Monocytes Absolute: 0.6 10*3/uL (ref 0.1–1.0)
Monocytes Relative: 13.1 % — ABNORMAL HIGH (ref 3.0–12.0)
Neutro Abs: 2.2 10*3/uL (ref 1.4–7.7)
Neutrophils Relative %: 51.3 % (ref 43.0–77.0)
Platelets: 253 10*3/uL (ref 150.0–400.0)
RBC: 3.71 Mil/uL — ABNORMAL LOW (ref 3.87–5.11)
RDW: 15 % (ref 11.5–15.5)
WBC: 4.3 10*3/uL (ref 4.0–10.5)

## 2022-02-04 LAB — SEDIMENTATION RATE: Sed Rate: 38 mm/hr — ABNORMAL HIGH (ref 0–30)

## 2022-02-04 LAB — TSH: TSH: 2.84 u[IU]/mL (ref 0.35–5.50)

## 2022-02-04 LAB — VITAMIN B12: Vitamin B-12: 731 pg/mL (ref 211–911)

## 2022-02-04 LAB — C-REACTIVE PROTEIN: CRP: <1 mg/dL (ref 0.5–20.0)

## 2022-02-04 LAB — T4, FREE: Free T4: 0.93 ng/dL (ref 0.60–1.60)

## 2022-02-04 MED ORDER — BENZONATATE 100 MG PO CAPS: ORAL_CAPSULE | ORAL | 0 refills | Status: DC

## 2022-02-04 NOTE — Patient Instructions (Addendum)
COVID infection December 30, 2021.  Now having persistent fatigue, arthralgias, myalgias and cough at night.  No obvious wheezing reported.  Vital stable and clinically stable presently.  Will get CBC, CMP, TSH, T4 and B12 today.  Chest x-ray as well.  Prescribing benzonatate for cough.  Tylenol for body aches and myalgia.  With low GFR on lab review avoiding NSAIDs.  If getting obvious wheezing or coughing at night despite using benzonatate recommend add on Atrovent and continue Flovent daily.  Follow-up in 2 weeks with PCP or sooner if needed(if signs symptoms change or worsen please let us know.)

## 2022-02-04 NOTE — Progress Notes (Signed)
Subjective:    Patient ID: Julie Wilkerson, female    DOB: Jul 28, 1940, 81 y.o.   MRN: [id number]  HPI Pt in for follow up.  Pt states just recently 12-30-2021. She tested positive in [place]. She states she went to ED in [place]. She states was given prednisone and then later antibiotic.    She states went thru 2 rounds of prednisone. She states got bronchitis but was diagnosed with covid pneumonia.   She states at time of diagnosis was confused.   This was first covid infection. She had 4 covid vaccines in the past.  Pt states she feels tired, body aches and mild ha since covid. Reporting minimal improvement in her energy.  Pt is coughing occasionally. Some at night. She is using flovent. Also has atrovent. Notes more cough at night. No wheezing heard. States albuterol makes her shake.  Reviewed labs and xray studies in [place].       Review of Systems  Constitutional:  Positive for fatigue. Negative for chills and fever.  HENT:  Negative for congestion, ear pain, mouth sores and sore throat.   Respiratory:  Positive for cough. Negative for chest tightness and wheezing.   Cardiovascular:  Negative for chest pain and palpitations.  Gastrointestinal:  Negative for abdominal pain, constipation and diarrhea.  Musculoskeletal:  Positive for arthralgias and myalgias. Negative for back pain.  Skin:  Negative for rash.  Neurological:  Negative for dizziness, numbness and headaches.  Hematological:  Negative for adenopathy. Does not bruise/bleed easily.  Psychiatric/Behavioral:  Negative for behavioral problems, decreased concentration and dysphoric mood.     Past Medical History:  Diagnosis Date   Abdominal pain 11/07/2016   Accelerating angina ([place]) 36/62/9476   Acute diastolic CHF (congestive heart failure) ([place]) 02/16/2019   Acute on chronic diastolic CHF (congestive heart failure) ([place]) 02/23/2019   Acute pansinusitis 04/26/2017   Amblyopia of eye, left 04/19/2018   Anemia     Anginal pain (HCC)    Anxiety    Arthritis    Asthma    Atypical chest pain 02/04/2019   Bilateral carotid bruits 03/28/2017   Blood transfusion without reported diagnosis    Breast cancer ([place])    2002, left, encapsulated, microcalcifications. lumpectomy, radtiation x 30   Breast cancer in female [facility])    Bursitis of left hip 09/04/2018   Change in bowel habits 02/22/2018   Change in mole 06/08/2016   Chest pain 03/04/2020   CHF (congestive heart failure) (HCC)    Chronic bilateral thoracic back pain 05/23/2016   Chronic diastolic congestive heart failure ([place]) 05/28/2019   Colitis    Complication of anesthesia    VERY SENSITIVE TO ANESTHESIA    Constipation 01/17/2018   Coronary artery disease involving native coronary artery of native heart without angina pectoris 05/28/2019   Cortical age-related cataract of right eye 04/18/2018   Cough variant asthma  vs UACS from ACEi     Spirometry 12/02/2016  FEV1 1.26 (76%)  Ratio 78 with min curvature p last saba > 6 h prior  - trial off acei 12/02/2016  - 12/02/2016  After extensive coaching HFA effectiveness =    75% with qvar autohaler > rechallenge with 80 2bid     Decreased visual acuity 01/16/2017   Degeneration of lumbar intervertebral disc 08/01/2018   Diarrhea 11/16/2015   D/o Diverticuli, polyps, celiac disease.     Dyspnea 11/04/2016   with asthma exacerbation only   Dysuria 11/04/2016   Elevated sed rate  10/23/2017   Essential hypertension    Trial off acei 12/02/2016 due to cough/ pseudoasthma   GERD (gastroesophageal reflux disease)    Gluten intolerance    History of chicken pox    History of Helicobacter pylori infection 08/01/2015   History of rectal bleeding 06/01/2019   Hx of LASIK 04/24/2019   Hyperglycemia 11/07/2016   Hyperlipidemia    Hypertension    Hyponatremia 02/16/2019   Hypothyroidism    IBS (irritable bowel syndrome)    Lattice degeneration of right retina 04/24/2019   Left hand pain 05/07/2018    Left hip pain 03/01/2017   Left leg pain 03/01/2017   Left shoulder pain 05/07/2018   Low back pain 08/10/2015   LUQ pain 06/01/2019   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   Neck pain 03/01/2017   Nuclear sclerotic cataract of right eye 04/18/2018   Osteoporosis    Overweight (BMI 25.0-29.9) 02/14/2019   Pain in joint of left shoulder 08/11/2018   Paroxysmal atrial tachycardia ([place]) 05/28/2019   Pelvic pain 07/02/2018   Personal history of radiation therapy    Pneumonia    PONV (postoperative nausea and vomiting)    Poor appetite 02/22/2018   Pseudophakia of left eye 04/18/2018   PVD (posterior vitreous detachment), right 04/24/2019   Restless sleeper 11/07/2016   Right hip pain 10/23/2017   RLQ discomfort 03/01/2017   RLS (restless legs syndrome) 05/07/2019   S/P left THA, AA 02/13/2019   Sepsis due to pneumonia ([place]) 02/16/2019   Sleep apnea    uses CPAP   Stomach cramps    Tremor 02/15/2016   Urinary frequency 12/22/2017   Urinary incontinence 05/07/2019   Urinary tract infection 11/04/2016   Vitamin D deficiency 04/29/2016     Social History   Socioeconomic History   Marital status: Single    Spouse name: Not on file   Number of children: 3   Years of education: 18   Highest education level: Master's degree (e.g., MA, MS, MEng, MEd, MSW, MBA)  Occupational History   Occupation: retired    Comment: Pharmacist, hospital, kindergarten  Tobacco Use   Smoking status: Never   Smokeless tobacco: Never  Vaping Use   Vaping Use: Never used  Substance and Sexual Activity   Alcohol use: Never    Alcohol/week: 0.0 standard drinks of alcohol   Drug use: Never   Sexual activity: Not Currently    Birth control/protection: None    Comment: lives alone, avoids daiiry and gluten. 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  Other Topics Concern   Not on file  Social History Narrative   Not on file   Social Determinants of Health   Financial Resource  Strain: Low Risk  (12/19/2020)   Overall Financial Resource Strain (CARDIA)    Difficulty of Paying Living Expenses: Not hard at all  Food Insecurity: No Food Insecurity (12/19/2020)   Hunger Vital Sign    Worried About Running Out of Food in the Last Year: Never true    [place] in the Last Year: Never true  Transportation Needs: No Transportation Needs (12/19/2020)   PRAPARE - Hydrologist (Medical): No    Lack of Transportation (Non-Medical): No  Physical Activity: Insufficiently Active (12/19/2020)   Exercise Vital Sign    Days of Exercise per Week: 1 day    Minutes of Exercise per Session: 30 min  Stress: No Stress Concern Present (12/19/2020)   [employer] - Occupational Stress Questionnaire    Feeling of Stress : Not at all  Social Connections: Not on file  Intimate Partner Violence: Not At Risk (12/19/2020)   Humiliation, Afraid, Rape, and Kick questionnaire    Fear of Current or Ex-Partner: No    Emotionally Abused: No    Physically Abused: No    Sexually Abused: No    Past Surgical History:  Procedure Laterality Date   ABDOMINAL HYSTERECTOMY     partial   APPENDECTOMY     BREAST LUMPECTOMY Left 2002   CHOLECYSTECTOMY  Age 29 or 47   COLONOSCOPY  2017   EYE SURGERY Left    cataract   LEFT HEART CATH AND CORONARY ANGIOGRAPHY N/A 02/09/2019   Procedure: LEFT HEART CATH AND CORONARY ANGIOGRAPHY;  Surgeon: Nelva Bush, MD;  Location: [facility];  Service: Cardiovascular;  Laterality: N/A;   TONSILLECTOMY     TOTAL HIP ARTHROPLASTY Left 02/13/2019   Procedure: TOTAL HIP ARTHROPLASTY ANTERIOR APPROACH;  Surgeon: Paralee Cancel, MD;  Location: [facility];  Service: Orthopedics;  Laterality: Left;  70 mins   UPPER GASTROINTESTINAL ENDOSCOPY      Family History  Problem Relation Age of Onset   Colitis Mother    Irritable bowel syndrome Mother    Hypertension Father    Hyperlipidemia Sister     Hyperlipidemia Brother    Heart attack Brother    Hyperlipidemia Brother    Heart attack Brother    Hyperlipidemia Brother    Heart attack Brother    Stomach cancer Paternal Grandmother 33   Colon cancer Paternal Grandmother    Leukemia Daughter    Arthritis Daughter    Heart disease Daughter        ASD vs VSD   Esophageal cancer Neg Hx    Pancreatic cancer Neg Hx    Rectal cancer Neg Hx     Allergies  Allergen Reactions   Valium [Diazepam] Shortness Of Breath    Sob and bradycardia   Albuterol And Levalbuterol Palpitations    Episode of palpitations, tremor and anxiety when administered levalbuterol for PFT. Recommend avoiding this medicaiton class    Current Outpatient Medications on File Prior to Visit  Medication Sig Dispense Refill   ASPIRIN 81 PO 81 mg daily.      citalopram (CELEXA) 20 MG tablet Take 1 tablet (20 mg total) by mouth at bedtime. 90 tablet 1   fexofenadine (ALLEGRA ALLERGY) 180 MG tablet Take 1 tablet (180 mg total) by mouth daily as needed for allergies or rhinitis.     fluticasone (FLOVENT HFA) 110 MCG/ACT inhaler Inhale 1-2 puffs into the lungs 2 (two) times daily as needed. 1 each 12   furosemide (LASIX) 40 MG tablet Take 1 tablet by mouth once daily 90 tablet 0   hyoscyamine (LEVSIN SL) 0.125 MG SL tablet Place 1 tablet (0.125 mg total) under the tongue 2 (two) times daily. 30 tablet 1   ipratropium (ATROVENT) 0.02 % nebulizer solution Take 2.5 mLs (0.5 mg total) by nebulization every 6 (six) hours as needed for wheezing or shortness of breath. 75 mL 12   levothyroxine (SYNTHROID) 50 MCG tablet TAKE 1 TABLET BY MOUTH ONCE DAILY BEFORE BREAKFAST 90 tablet 0   lisinopril (ZESTRIL) 10 MG tablet Take 1 tablet by mouth twice daily as needed 180 tablet 0   metoprolol succinate (TOPROL-XL) 25 MG 24 hr tablet TAKE 1 TABLET BY MOUTH ONCE DAILY AT BEDTIME 90 tablet 0   nitrofurantoin,  macrocrystal-monohydrate, (MACROBID) 100 MG capsule Take 1 capsule (100 mg  total) by mouth 2 (two) times daily. 14 capsule 0   NON FORMULARY as needed (wheezing, SOB). Iprasynt inhaler (unable to find english equivalent med name)     omega-3 acid ethyl esters (LOVAZA) 1 g capsule Take 2 g by mouth daily.     omeprazole (PRILOSEC) 40 MG capsule Take 1 capsule (40 mg total) by mouth in the morning and at bedtime. Take 1 tablet by mouth once to twice daily 60 capsule 5   ondansetron (ZOFRAN) 4 MG tablet Dissolve 1 tablet under tongue once to twice a day, every day 90 tablet 3   potassium chloride (KLOR-CON M) 10 MEQ tablet Take 1 tablet by mouth once daily 90 tablet 0   sucralfate (CARAFATE) 1 g tablet Take 1 tablet by mouth twice daily 60 tablet 0   No current facility-administered medications on file prior to visit.    BP 130/60   Pulse 80   Temp 98.5 F (36.9 C)   Resp 18   Ht '4\' 10"'$  (1.473 m)   Wt 116 lb (52.6 kg)   SpO2 99%   BMI 24.24 kg/m        Objective:   Physical Exam  General Mental Status- Alert. General Appearance- Not in acute distress.   Skin General: Color- Normal Color. Moisture- Normal Moisture.  Neck Carotid Arteries- Normal color. Moisture- Normal Moisture. No carotid bruits. No JVD.  Chest and Lung Exam Auscultation: Breath Sounds:-Normal.  Cardiovascular Auscultation:Rythm- Regular. Murmurs & Other Heart Sounds:Auscultation of the heart reveals- No Murmurs.  Abdomen Inspection:-Inspeection Normal. Palpation/Percussion:Note:No mass. Palpation and Percussion of the abdomen reveal- Non Tender, Non Distended + BS, no rebound or guarding.   Neurologic Cranial Nerve exam:- CN III-XII intact(No nystagmus), symmetric smile. Strength:- 5/5 equal and symmetric strength both upper and lower extremities.   Lower ext- no pedal edema. Cavles symmetric.  Assessment & Plan:   Patient Instructions  COVID infection December 30, 2021.  Now having persistent fatigue, arthralgias, myalgias and cough at night.  No obvious wheezing  reported.  Vital stable and clinically stable presently.  Will get CBC, CMP, TSH, T4 and B12 today.  Chest x-ray as well.  Prescribing benzonatate for cough.  Tylenol for body aches and myalgia.  With low GFR on lab review avoiding NSAIDs.  If getting obvious wheezing or coughing at night despite using benzonatate recommend add on Atrovent and continue Flovent daily.  Follow-up in 2 weeks with PCP or sooner if needed(if signs symptoms change or worsen please let us know.)    Mackie Pai, PA-C

## 2022-02-07 LAB — RHEUMATOID FACTOR: Rheumatoid fact SerPl-aCnc: 43 IU/mL — ABNORMAL HIGH (ref <14)

## 2022-02-07 LAB — ANTI-NUCLEAR AB-TITER (ANA TITER): ANA Titer 1: 1:80 {titer} — ABNORMAL HIGH

## 2022-02-07 LAB — ANA: Anti Nuclear Antibody (ANA): POSITIVE — AB

## 2022-02-09 ENCOUNTER — Ambulatory Visit: Payer: Medicare HMO

## 2022-02-16 ENCOUNTER — Ambulatory Visit: Payer: Medicare HMO | Attending: Internal Medicine | Admitting: Internal Medicine

## 2022-02-24 ENCOUNTER — Ambulatory Visit: Payer: Medicare HMO | Attending: Internal Medicine | Admitting: Internal Medicine

## 2022-02-24 ENCOUNTER — Encounter: Payer: Self-pay | Admitting: Internal Medicine

## 2022-02-24 VITALS — BP 112/56 | HR 63 | Ht <= 58 in | Wt 117.6 lb

## 2022-02-24 DIAGNOSIS — R079 Chest pain, unspecified: Secondary | ICD-10-CM

## 2022-02-24 NOTE — Progress Notes (Signed)
Cardiology Office Note:    Date:  02/24/2022   ID:  Julie Wilkerson, DOB 1941/03/26, MRN [id number]  PCP:  [name], MD   [facility] Providers Cardiologist:  Janina Mayo, MD     Referring MD: [name], MD   No chief complaint on file. Chest pain  History of Present Illness:   Initial Visit Julie Wilkerson is a 81 y.o. female with a hx of HTN, non obstructive CAD, central sleep apnea uses CPAP, hypothyroidism,  breast cancer survivor, comes in today for chest pain in her chest and side.Her left arm gets numb. This has been going on for 1 year. Sometimes it is two days in a row, then it stops.It lasts for seconds to minutes. She rubs it and moves her arm. That improves it. He was getting speciality messages that helped with the pain. During the day she does house cleaning , walks in the neighborhood. She has back pain. She noted LH,dizzy sometimes with headaches. No dyspnea on exertion. She can walk up a flight of stairs fine. Her BP at home 120s. No orthopnea, PND, no LE edema. She cannot sleep with her CPAP.  Cardiac Hx: She underwent cath 02/09/2019 prior to hip arthroplasty procedure. She had mild-moderate non obstructive dx. No evidence of myocardial bridge. Normal filling pressures. Saw Dr. Claiborne Billings for a sleep study who recommended CPAP.    Interim Hx: Her pain has not progressed. But it has not resolved. Her house cleaning is going well. No symptoms with activity  Interim hx 11/05/2021 She was seen in the ED on 11/02/2021 for chest pain. Notes substernal discomfort that radiates to her back. She was having DOE. She notes resolution of chest pain. Notes swelling in her ankles that occurs in the afternoon for which she wears compression stockings. She is struggling with her CPAP. Blood pressure is well controlled. She also reported pain in her R calf with carrying groceries as well as her thighs  Interim 02/24/2022 She recently had COVID and managed for this. Otherwise she  feels well.   Cardiology Studies:  Coronary CTA 01/13/2022 coronary CTA with low CAC score 2.52.   TTE 01/13/21 Normal LV fxn Normal RV fxn No valve dx Normal IVC  Cath 02/09/2019 - LHC mild-moderate non obstructive dx  Cardiac Monitor: palpitations 04/03/2019 9 SVTlongest 13 beats. No atrial fibrillation, no block. No VT.  SPECT 01/18/2017 -Normal  03/29/2017 BL carotid US negative  Past Medical History:  Diagnosis Date   Abdominal pain 11/07/2016   Accelerating angina ([place]) 02/63/7858   Acute diastolic CHF (congestive heart failure) ([place]) 02/16/2019   Acute on chronic diastolic CHF (congestive heart failure) ([place]) 02/23/2019   Acute pansinusitis 04/26/2017   Amblyopia of eye, left 04/19/2018   Anemia    Anginal pain (HCC)    Anxiety    Arthritis    Asthma    Atypical chest pain 02/04/2019   Bilateral carotid bruits 03/28/2017   Blood transfusion without reported diagnosis    Breast cancer ([place])    2002, left, encapsulated, microcalcifications. lumpectomy, radtiation x 30   Breast cancer in female [facility])    Bursitis of left hip 09/04/2018   Change in bowel habits 02/22/2018   Change in mole 06/08/2016   Chest pain 03/04/2020   CHF (congestive heart failure) (HCC)    Chronic bilateral thoracic back pain 05/23/2016   Chronic diastolic congestive heart failure ([place]) 05/28/2019   Colitis    Complication of anesthesia  VERY SENSITIVE TO ANESTHESIA    Constipation 01/17/2018   Coronary artery disease involving native coronary artery of native heart without angina pectoris 05/28/2019   Cortical age-related cataract of right eye 04/18/2018   Cough variant asthma  vs UACS from ACEi     Spirometry 12/02/2016  FEV1 1.26 (76%)  Ratio 78 with min curvature p last saba > 6 h prior  - trial off acei 12/02/2016  - 12/02/2016  After extensive coaching HFA effectiveness =    75% with qvar autohaler > rechallenge with 80 2bid     Decreased visual acuity 01/16/2017   Degeneration of  lumbar intervertebral disc 08/01/2018   Diarrhea 11/16/2015   D/o Diverticuli, polyps, celiac disease.     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   Left hand pain 05/07/2018   Left hip pain 03/01/2017   Left leg pain 03/01/2017   Left shoulder pain 05/07/2018   Low back pain 08/10/2015   LUQ pain 06/01/2019   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   Neck pain 03/01/2017   Nuclear sclerotic cataract of right eye 04/18/2018   Osteoporosis    Overweight (BMI 25.0-29.9) 02/14/2019   Pain in joint of left shoulder 08/11/2018   Paroxysmal atrial tachycardia ([place]) 05/28/2019   Pelvic pain 07/02/2018   Personal history of radiation therapy    Pneumonia    PONV (postoperative nausea and vomiting)    Poor appetite 02/22/2018   Pseudophakia of left eye 04/18/2018   PVD (posterior vitreous detachment), right 04/24/2019   Restless sleeper 11/07/2016   Right hip pain 10/23/2017   RLQ discomfort 03/01/2017   RLS (restless legs syndrome) 05/07/2019   S/P left THA, AA 02/13/2019   Sepsis due to pneumonia ([place]) 02/16/2019   Sleep apnea    uses CPAP   Stomach cramps    Tremor 02/15/2016   Urinary frequency 12/22/2017   Urinary incontinence 05/07/2019   Urinary tract infection 11/04/2016   Vitamin D deficiency 04/29/2016    Past Surgical History:  Procedure Laterality Date   ABDOMINAL HYSTERECTOMY      partial   APPENDECTOMY     BREAST LUMPECTOMY Left 2002   CHOLECYSTECTOMY  Age 18 or 46   COLONOSCOPY  2017   EYE SURGERY Left    cataract   LEFT HEART CATH AND CORONARY ANGIOGRAPHY N/A 02/09/2019   Procedure: LEFT HEART CATH AND CORONARY ANGIOGRAPHY;  Surgeon: Nelva Bush, MD;  Location: [facility];  Service: Cardiovascular;  Laterality: N/A;   TONSILLECTOMY     TOTAL HIP ARTHROPLASTY Left 02/13/2019   Procedure: TOTAL HIP ARTHROPLASTY ANTERIOR APPROACH;  Surgeon: Paralee Cancel, MD;  Location: [facility];  Service: Orthopedics;  Laterality: Left;  70 mins   UPPER GASTROINTESTINAL ENDOSCOPY      Current Medications: No outpatient medications have been marked as taking for the 02/24/22 encounter (Appointment) with Phineas Inches  E, MD.     Allergies:   Valium [diazepam] and Albuterol and levalbuterol   Social History   Socioeconomic History   Marital status: Single    Spouse name: Not on file   Number of children: 3   Years of education: 14   Highest education level: Master's degree (e.g., MA, MS, MEng, MEd, MSW, MBA)  Occupational History   Occupation: retired    Comment: Pharmacist, hospital, kindergarten  Tobacco Use   Smoking status: Never   Smokeless tobacco: Never  Vaping Use   Vaping Use: Never used  Substance and Sexual Activity   Alcohol use: Never    Alcohol/week: 0.0 standard drinks of alcohol   Drug use: Never   Sexual activity: Not Currently    Birth control/protection: None    Comment: lives alone, avoids daiiry and gluten. 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  Other Topics Concern   Not on file  Social History Narrative   Not on file   Social Determinants of Health   Financial Resource Strain: Low Risk  (12/19/2020)   Overall Financial Resource Strain (CARDIA)    Difficulty of Paying Living Expenses: Not hard at all  Food Insecurity: No Food Insecurity (12/19/2020)   Hunger Vital Sign    Worried About Running Out of Food in the Last Year: Never true    [place]  in the Last Year: Never true  Transportation Needs: No Transportation Needs (12/19/2020)   PRAPARE - Hydrologist (Medical): No    Lack of Transportation (Non-Medical): No  Physical Activity: Insufficiently Active (12/19/2020)   Exercise Vital Sign    Days of Exercise per Week: 1 day    Minutes of Exercise per Session: 30 min  Stress: No Stress Concern Present (12/19/2020)   [place]    Feeling of Stress : Not at all  Social Connections: Not on file     Family History: The patient's family history includes Arthritis in her daughter; Colitis in her mother; Colon cancer in her paternal grandmother; Heart attack in her brother, brother, and brother; Heart disease in her daughter; Hyperlipidemia in her brother, brother, brother, and sister; Hypertension in her father; Irritable bowel syndrome in her mother; Leukemia in her daughter; Stomach cancer (age of onset: 14) in her paternal grandmother. There is no history of Esophageal cancer, Pancreatic cancer, or Rectal cancer.  ROS:   Please see the history of present illness.     All other systems reviewed and are negative.  EKGs/Labs/Other Studies Reviewed:    The following studies were reviewed today:   EKG:  EKG is  ordered today.  The ekg ordered today demonstrates   Prior EKGs Sinus bradycardia,non specific ST-T changes Sinus bradycardia   Recent Labs: 06/23/2021: Pro B Natriuretic peptide (BNP) 159.0 02/04/2022: ALT 16; BUN 35; Creatinine, Ser 1.31; Hemoglobin 11.0; Platelets 253.0; Potassium 4.3; Sodium 140; TSH 2.84   Recent Lipid Panel    Component Value Date/Time   CHOL 304 (H) 12/15/2021 1239   TRIG 140.0 12/15/2021 1239   HDL 73.20 12/15/2021 1239   CHOLHDL 4 12/15/2021 1239   VLDL 28.0 12/15/2021 1239   LDLCALC 203 (H) 12/15/2021 1239   LDLCALC 79 02/01/2020 1020     Risk Assessment/Calculations:           Physical  Exam:    VS:   Vitals:   02/24/22 1202  BP: (!) 112/56  Pulse: 63  SpO2: 96%  Wt Readings from Last 3 Encounters:  02/04/22 116 lb (52.6 kg)  12/25/21 116 lb (52.6 kg)  12/15/21 116 lb 6.4 oz (52.8 kg)     GEN:  Well nourished, well developed in no acute distress HEENT: Normal NECK: No JVD; No carotid bruits CARDIAC: RRR, no murmurs, rubs, gallops RESPIRATORY:  Clear to auscultation without rales, wheezing or rhonchi  ABDOMEN: Soft, non-tender, non-distended MUSCULOSKELETAL:  No edema; No deformity  SKIN: Warm and dry NEUROLOGIC:  Alert and oriented x 3 PSYCHIATRIC:  Normal affect   ASSESSMENT:    #CP/DOE: had prior MSK pain. Now with more progressive symptoms including DOE and chest pain. Coronary CTA was did not show a cardiac cause.  #Leg Cramping: ABI was normal  HTN: continue lisinopril 10 mg at night, metop XL 25 mg daily.   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 Continue to encourage compression stockings.   PLAN:    In order of problems listed above:  Follow up PRN   Medication Adjustments/Labs and Tests Ordered: Current medicines are reviewed at length with the patient today.  Concerns regarding medicines are outlined above.    Signed, Janina Mayo, MD  02/24/2022 10:20 AM

## 2022-02-24 NOTE — Patient Instructions (Signed)
Medication Instructions:  No Changes In Medications at this time.  *If you need a refill on your cardiac medications before your next appointment, please call your pharmacy*  Follow-Up: At [facility], you and your health needs are our priority.  As part of our continuing mission to provide you with exceptional heart care, we have created designated Provider Care Teams.  These Care Teams include your primary Cardiologist (physician) and Advanced Practice Providers (APPs -  Physician Assistants and Nurse Practitioners) who all work together to provide you with the care you need, when you need it.  Your next appointment:   AS NEEDED   The format for your next appointment:   In Person  Provider:   Janina Mayo, MD

## 2022-02-25 ENCOUNTER — Other Ambulatory Visit: Payer: Self-pay | Admitting: Family Medicine

## 2022-03-05 ENCOUNTER — Other Ambulatory Visit: Payer: Self-pay | Admitting: Family Medicine

## 2022-03-15 ENCOUNTER — Encounter: Payer: Self-pay | Admitting: *Deleted

## 2022-03-18 ENCOUNTER — Ambulatory Visit: Payer: Medicare HMO | Admitting: Pulmonary Disease

## 2022-03-22 ENCOUNTER — Other Ambulatory Visit: Payer: Self-pay | Admitting: Family Medicine

## 2022-03-24 NOTE — Assessment & Plan Note (Addendum)
[id number] acceptable, minimize simple carbs. Increase exercise as tolerated RSV (respiratory syncitial virus) vaccine at pharmacy, Arexvy Covid booster at pharmacy High dose flu shot  Shingrix is the new shingles shot, 2 shots over 2-6 months, confirm coverage with insurance and document, then can return here for shots with nurse appt or at pharmacy

## 2022-03-24 NOTE — Assessment & Plan Note (Signed)
Well controlled, no changes to meds. Encouraged heart healthy diet such as the DASH diet and exercise as tolerated.  °

## 2022-03-24 NOTE — Assessment & Plan Note (Signed)
Stable, no recent exacerbation

## 2022-03-24 NOTE — Assessment & Plan Note (Signed)
Encourage heart healthy diet such as MIND or DASH diet, increase exercise, avoid trans fats, simple carbohydrates and processed foods, consider a krill or fish or flaxseed oil cap daily.  °

## 2022-03-24 NOTE — Assessment & Plan Note (Signed)
monitor

## 2022-03-24 NOTE — Assessment & Plan Note (Signed)
On Levothyroxine, continue to monitor 

## 2022-03-24 NOTE — Assessment & Plan Note (Signed)
Supplement and monitor 

## 2022-03-25 ENCOUNTER — Ambulatory Visit (INDEPENDENT_AMBULATORY_CARE_PROVIDER_SITE_OTHER): Payer: Medicare HMO | Admitting: Family Medicine

## 2022-03-25 VITALS — BP 125/60 | HR 59 | Temp 97.0°F | Resp 16 | Ht <= 58 in | Wt 116.8 lb

## 2022-03-25 DIAGNOSIS — I509 Heart failure, unspecified: Secondary | ICD-10-CM | POA: Diagnosis not present

## 2022-03-25 DIAGNOSIS — Z23 Encounter for immunization: Secondary | ICD-10-CM

## 2022-03-25 DIAGNOSIS — R700 Elevated erythrocyte sedimentation rate: Secondary | ICD-10-CM

## 2022-03-25 DIAGNOSIS — R519 Headache, unspecified: Secondary | ICD-10-CM | POA: Diagnosis not present

## 2022-03-25 DIAGNOSIS — H5712 Ocular pain, left eye: Secondary | ICD-10-CM | POA: Diagnosis not present

## 2022-03-25 DIAGNOSIS — R32 Unspecified urinary incontinence: Secondary | ICD-10-CM

## 2022-03-25 DIAGNOSIS — R3915 Urgency of urination: Secondary | ICD-10-CM

## 2022-03-25 DIAGNOSIS — E559 Vitamin D deficiency, unspecified: Secondary | ICD-10-CM

## 2022-03-25 DIAGNOSIS — I10 Essential (primary) hypertension: Secondary | ICD-10-CM | POA: Diagnosis not present

## 2022-03-25 DIAGNOSIS — E782 Mixed hyperlipidemia: Secondary | ICD-10-CM | POA: Diagnosis not present

## 2022-03-25 DIAGNOSIS — H539 Unspecified visual disturbance: Secondary | ICD-10-CM

## 2022-03-25 DIAGNOSIS — R739 Hyperglycemia, unspecified: Secondary | ICD-10-CM

## 2022-03-25 DIAGNOSIS — E039 Hypothyroidism, unspecified: Secondary | ICD-10-CM

## 2022-03-25 DIAGNOSIS — H5789 Other specified disorders of eye and adnexa: Secondary | ICD-10-CM

## 2022-03-25 LAB — COMPREHENSIVE METABOLIC PANEL
ALT: 14 U/L (ref 0–35)
AST: 21 U/L (ref 0–37)
Albumin: 4.3 g/dL (ref 3.5–5.2)
Alkaline Phosphatase: 71 U/L (ref 39–117)
BUN: 29 mg/dL — ABNORMAL HIGH (ref 6–23)
CO2: 29 mEq/L (ref 19–32)
Calcium: 9.2 mg/dL (ref 8.4–10.5)
Chloride: 101 mEq/L (ref 96–112)
Creatinine, Ser: 1.27 mg/dL — ABNORMAL HIGH (ref 0.40–1.20)
GFR: 39.99 mL/min — ABNORMAL LOW (ref >60.00)
Glucose, Bld: 86 mg/dL (ref 70–99)
Potassium: 4.6 mEq/L (ref 3.5–5.1)
Sodium: 140 mEq/L (ref 135–145)
Total Bilirubin: 0.5 mg/dL (ref 0.2–1.2)
Total Protein: 7.2 g/dL (ref 6.0–8.3)

## 2022-03-25 LAB — CBC
HCT: 34.1 % — ABNORMAL LOW (ref 36.0–46.0)
Hemoglobin: 11.2 g/dL — ABNORMAL LOW (ref 12.0–15.0)
MCHC: 32.9 g/dL (ref 30.0–36.0)
MCV: 87.4 fl (ref 78.0–100.0)
Platelets: 206 10*3/uL (ref 150.0–400.0)
RBC: 3.9 Mil/uL (ref 3.87–5.11)
RDW: 15.2 % (ref 11.5–15.5)
WBC: 5.1 10*3/uL (ref 4.0–10.5)

## 2022-03-25 LAB — LIPID PANEL
Cholesterol: 273 mg/dL — ABNORMAL HIGH (ref 0–200)
HDL: 72.2 mg/dL (ref >39.00)
LDL Cholesterol: 171 mg/dL — ABNORMAL HIGH (ref 0–99)
NonHDL: 201.28
Total CHOL/HDL Ratio: 4
Triglycerides: 152 mg/dL — ABNORMAL HIGH (ref 0.0–149.0)
VLDL: 30.4 mg/dL (ref 0.0–40.0)

## 2022-03-25 LAB — TSH: TSH: 1.88 u[IU]/mL (ref 0.35–5.50)

## 2022-03-25 LAB — HEMOGLOBIN A1C: Hgb A1c MFr Bld: 6.3 % (ref 4.6–6.5)

## 2022-03-25 LAB — SEDIMENTATION RATE: Sed Rate: 23 mm/hr (ref 0–30)

## 2022-03-25 NOTE — Assessment & Plan Note (Signed)
Has noted worsening left sided headaches over the last few months. She describes pressure behind her eye and some eye pain. Will order a CT head to evaluate and she is referred to a new ophthalmologist as she has been unable to be seen.

## 2022-03-25 NOTE — Progress Notes (Addendum)
Subjective:   By signing my name below, I, [name], attest that this documentation has been prepared under the direction and in the presence of [name], MD., 03/25/2022.   Patient ID: Julie Wilkerson, female    DOB: [birth year], 81 y.o.   MRN: [id number]  Chief Complaint  Patient presents with   Follow-up    Here for follow up   HPI Patient is in today for an office visit.  Appetite: She states that she has a reduced appetite and feels full soon after eating.   Headaches: She complains of constant headaches, which cause pain on the left side of her head that radiates to her left eye and lips. She says that she has 3-4 headaches weekly and takes Tylenol to manage the pain. She denies having nausea.  Immunizations: She has been informed about receiving COVID-19 and RSV immunizations. She will receive the high-dose Flu immunization today.   Left Shoulder Pain: She complains of intermittent left shoulder pain that occasionally radiates down her left arm. She states that she occasionally experiences numbness in her left fingers.   Ophthalmology: She states that she has been unable to make an appointment with ophthalmology to manage her left eye pain and vision changes and is interested in receiving an ophthalmology referral. She states that there is itching and redness on her left eye lid.  Urology: She states that she experiences incontinence, urgency and difficulty urinating and is interested in receiving a urology referral.  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 lumpectomy, radtiation x 30   Breast cancer in female [facility])    Bursitis of left hip 09/04/2018   Change in bowel habits 02/22/2018   Change in mole 06/08/2016   Chest pain 03/04/2020   CHF (congestive heart failure) (HCC)    Chronic bilateral thoracic back pain 05/23/2016   Chronic diastolic congestive heart failure ([place]) 05/28/2019   Colitis    Complication of anesthesia    VERY SENSITIVE TO ANESTHESIA    Constipation 01/17/2018   Coronary artery disease involving native coronary artery of native heart without angina pectoris 05/28/2019   Cortical age-related cataract of right eye 04/18/2018   Cough variant asthma  vs UACS from ACEi     Spirometry 12/02/2016  FEV1 1.26 (76%)  Ratio 78 with min curvature p last saba > 6 h prior  - trial off acei 12/02/2016  - 12/02/2016  After extensive coaching HFA effectiveness =    75% with qvar autohaler > rechallenge with 80 2bid     Decreased visual acuity 01/16/2017   Degeneration of lumbar intervertebral disc 08/01/2018   Diarrhea 11/16/2015   D/o Diverticuli, polyps, celiac disease.     Dyspnea 11/04/2016   with asthma exacerbation only   Dysuria 11/04/2016   Elevated sed rate 10/23/2017   Essential hypertension    Trial off acei 12/02/2016 due to cough/ pseudoasthma   GERD (gastroesophageal  reflux disease)    Gluten intolerance    History of chicken pox    History of Helicobacter pylori infection 08/01/2015   History of rectal bleeding 06/01/2019   Hx of LASIK 04/24/2019   Hyperglycemia 11/07/2016   Hyperlipidemia    Hypertension    Hyponatremia 02/16/2019   Hypothyroidism    IBS (irritable bowel syndrome)    Lattice degeneration of right retina 04/24/2019   Left hand pain 05/07/2018   Left hip pain 03/01/2017   Left leg pain 03/01/2017   Left shoulder pain 05/07/2018   Low back pain 08/10/2015   LUQ  pain 06/01/2019   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   Neck pain 03/01/2017   Nuclear sclerotic cataract of right eye 04/18/2018   Osteoporosis    Overweight (BMI 25.0-29.9) 02/14/2019   Pain in joint of left shoulder 08/11/2018   Paroxysmal atrial tachycardia 05/28/2019   Pelvic pain 07/02/2018   Personal history of radiation therapy    Pneumonia    PONV (postoperative nausea and vomiting)    Poor appetite 02/22/2018   Pseudophakia of left eye 04/18/2018   PVD (posterior vitreous detachment), right 04/24/2019   Restless sleeper 11/07/2016   Right hip pain 10/23/2017   RLQ discomfort 03/01/2017   RLS (restless legs syndrome) 05/07/2019   S/P left THA, AA 02/13/2019   Sepsis due to pneumonia ([place]) 02/16/2019   Sleep apnea    uses CPAP   Stomach cramps    Tremor 02/15/2016   Urinary frequency 12/22/2017   Urinary incontinence 05/07/2019   Urinary tract infection 11/04/2016   Vitamin D deficiency 04/29/2016   Past Surgical History:  Procedure Laterality Date   ABDOMINAL HYSTERECTOMY     partial   APPENDECTOMY     BREAST LUMPECTOMY Left 2002   CHOLECYSTECTOMY  Age 13 or 89   COLONOSCOPY  2017   EYE SURGERY Left    cataract   LEFT HEART CATH AND CORONARY ANGIOGRAPHY N/A 02/09/2019   Procedure: LEFT HEART CATH AND CORONARY ANGIOGRAPHY;  Surgeon: Nelva Bush, MD;  Location: [facility];  Service: Cardiovascular;  Laterality: N/A;   TONSILLECTOMY     TOTAL HIP ARTHROPLASTY Left 02/13/2019   Procedure: TOTAL HIP ARTHROPLASTY ANTERIOR APPROACH;  Surgeon: Paralee Cancel, MD;  Location: [facility];  Service: Orthopedics;  Laterality: Left;  70 mins   UPPER GASTROINTESTINAL ENDOSCOPY     Family History  Problem Relation Age of Onset   Colitis Mother    Irritable bowel syndrome Mother    Hypertension Father    Hyperlipidemia Sister    Hyperlipidemia Brother    Heart attack Brother    Hyperlipidemia Brother    Heart attack  Brother    Hyperlipidemia Brother    Heart attack Brother    Stomach cancer Paternal Grandmother 67   Colon cancer Paternal 72    Leukemia Daughter    Arthritis Daughter    Heart disease Daughter        ASD vs VSD   Esophageal cancer Neg Hx    Pancreatic cancer Neg Hx    Rectal cancer Neg Hx    Social History   Socioeconomic History   Marital status: Single    Spouse name: Not on file   Number of children: 3   Years of education: 18   Highest education level: Master's degree (e.g., MA, MS, MEng, 33, MSW, MBA)  Occupational History   Occupation: retired    Comment: Pharmacist, hospital, kindergarten  Tobacco Use   Smoking status: Never   Smokeless tobacco: Never  Vaping Use   Vaping Use: Never used  Substance and Sexual Activity   Alcohol use: Never    Alcohol/week: 0.0 standard drinks of alcohol   Drug use: Never   Sexual activity: Not Currently    Birth control/protection: None    Comment: lives alone, avoids daiiry and gluten. 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  Other Topics Concern   Not on file  Social History Narrative   Not on file   Social Determinants of Health   Financial Resource Strain: Low Risk  (12/19/2020)   Overall Financial Resource Strain (CARDIA)    Difficulty of Paying Living Expenses: Not hard at all  Food Insecurity: No Food Insecurity (12/19/2020)   Hunger Vital Sign    Worried About Running Out of Food in the Last Year: Never true    [place] in the Last Year: Never true  Transportation Needs: No Transportation Needs (12/19/2020)   PRAPARE - Hydrologist (Medical): No    Lack of Transportation (Non-Medical): No  Physical Activity: Insufficiently Active (12/19/2020)   Exercise Vital Sign    Days of Exercise per Week: 1 day    Minutes of Exercise per Session: 30 min  Stress: No Stress Concern Present (12/19/2020)   [place]    Feeling of Stress : Not at  all  Social Connections: Not on file  Intimate Partner Violence: Not At Risk (12/19/2020)   Humiliation, Afraid, Rape, and Kick questionnaire    Fear of Current or Ex-Partner: No    Emotionally Abused: No    Physically Abused: No    Sexually Abused: No   Outpatient Medications Prior to Visit  Medication Sig Dispense Refill   ASPIRIN 81 PO 81 mg daily.      benzonatate (TESSALON) 100 MG capsule 1 tab po tid prn cough 30 capsule 0   citalopram (CELEXA) 20 MG tablet Take 1 tablet (20 mg total) by mouth at bedtime. 90 tablet 0   fexofenadine (ALLEGRA ALLERGY) 180 MG tablet Take 1 tablet (180 mg total) by mouth daily as needed for allergies or rhinitis.     fluticasone (FLOVENT HFA) 110 MCG/ACT inhaler Inhale 1-2 puffs into the lungs 2 (two) times daily as needed. 1 each 12   furosemide (LASIX) 40 MG tablet Take 1 tablet by mouth once daily 90 tablet 0   hyoscyamine (LEVSIN SL) 0.125 MG SL tablet Place 1 tablet (0.125 mg total) under the tongue 2 (two) times daily. 30 tablet 1   ipratropium (ATROVENT) 0.02 % nebulizer solution Take 2.5 mLs (0.5 mg total) by nebulization every 6 (six) hours as needed for wheezing or shortness of breath. 75 mL 12   levothyroxine (SYNTHROID) 50 MCG tablet TAKE 1 TABLET BY MOUTH ONCE DAILY BEFORE BREAKFAST 90 tablet 0   lisinopril (ZESTRIL) 10 MG tablet Take 1 tablet by mouth twice daily as needed 180 tablet 0   metoprolol succinate (TOPROL-XL) 25 MG 24 hr tablet Take 1 tablet (25 mg total) by mouth at bedtime. 90 tablet 0   nitrofurantoin, macrocrystal-monohydrate, (MACROBID) 100 MG capsule Take 1 capsule (100 mg total) by mouth 2 (two) times daily. 14 capsule 0   NON FORMULARY as needed (wheezing, SOB). Iprasynt inhaler (unable to find english equivalent med name)     omega-3 acid ethyl esters (LOVAZA) 1 g capsule Take 2 g by mouth daily.  omeprazole (PRILOSEC) 40 MG capsule Take 1 capsule (40 mg total) by mouth in the morning and at bedtime. Take 1 tablet by  mouth once to twice daily 60 capsule 5   ondansetron (ZOFRAN) 4 MG tablet Dissolve 1 tablet under tongue once to twice a day, every day 90 tablet 3   potassium chloride (KLOR-CON M) 10 MEQ tablet Take 1 tablet (10 mEq total) by mouth daily. 90 tablet 1   sucralfate (CARAFATE) 1 g tablet Take 1 tablet by mouth twice daily 60 tablet 0   No facility-administered medications prior to visit.   Allergies  Allergen Reactions   Valium [Diazepam] Shortness Of Breath    Sob and bradycardia   Albuterol And Levalbuterol Palpitations    Episode of palpitations, tremor and anxiety when administered levalbuterol for PFT. Recommend avoiding this medicaiton class   ROS    Objective:    Physical Exam Constitutional:      General: She is not in acute distress.    Appearance: Normal appearance. She is not ill-appearing.  HENT:     Head: Normocephalic and atraumatic.     Right Ear: External ear normal.     Left Ear: External ear normal.     Mouth/Throat:     Mouth: Mucous membranes are moist.     Pharynx: Oropharynx is clear.  Eyes:     Extraocular Movements: Extraocular movements intact.     Pupils: Pupils are equal, round, and reactive to light.  Cardiovascular:     Rate and Rhythm: Normal rate and regular rhythm.     Pulses: Normal pulses.     Heart sounds: Normal heart sounds. No murmur heard.    No gallop.  Pulmonary:     Effort: Pulmonary effort is normal. No respiratory distress.     Breath sounds: Normal breath sounds. No wheezing or rales.  Abdominal:     General: Bowel sounds are normal.  Skin:    General: Skin is warm and dry.  Neurological:     Mental Status: She is alert and oriented to person, place, and time.  Psychiatric:        Mood and Affect: Mood normal.        Behavior: Behavior normal.        Judgment: Judgment normal.    BP 125/60 (BP Location: Left Arm, Patient Position: Sitting, Cuff Size: Small)   Pulse (!) 59   Temp (!) 97 F (36.1 C) (Oral)   Resp 16    Ht '4\' 10"'$  (1.473 m)   Wt 116 lb 12.8 oz (53 kg)   SpO2 98%   BMI 24.41 kg/m  Wt Readings from Last 3 Encounters:  03/25/22 116 lb 12.8 oz (53 kg)  02/24/22 117 lb 9.6 oz (53.3 kg)  02/04/22 116 lb (52.6 kg)   Diabetic Foot Exam - Simple   No data filed    Lab Results  Component Value Date   WBC 5.1 03/25/2022   HGB 11.2 (L) 03/25/2022   HCT 34.1 (L) 03/25/2022   PLT 206.0 03/25/2022   GLUCOSE 86 03/25/2022   CHOL 273 (H) 03/25/2022   TRIG 152.0 (H) 03/25/2022   HDL 72.20 03/25/2022   LDLCALC 171 (H) 03/25/2022   ALT 14 03/25/2022   AST 21 03/25/2022   NA 140 03/25/2022   K 4.6 03/25/2022   CL 101 03/25/2022   CREATININE 1.27 (H) 03/25/2022   BUN 29 (H) 03/25/2022   CO2 29 03/25/2022   TSH 1.88 03/25/2022  INR 1.2 02/16/2019   HGBA1C 6.3 03/25/2022   Lab Results  Component Value Date   TSH 1.88 03/25/2022   Lab Results  Component Value Date   WBC 5.1 03/25/2022   HGB 11.2 (L) 03/25/2022   HCT 34.1 (L) 03/25/2022   MCV 87.4 03/25/2022   PLT 206.0 03/25/2022   Lab Results  Component Value Date   NA 140 03/25/2022   K 4.6 03/25/2022   CO2 29 03/25/2022   GLUCOSE 86 03/25/2022   BUN 29 (H) 03/25/2022   CREATININE 1.27 (H) 03/25/2022   BILITOT 0.5 03/25/2022   ALKPHOS 71 03/25/2022   AST 21 03/25/2022   ALT 14 03/25/2022   PROT 7.2 03/25/2022   ALBUMIN 4.3 03/25/2022   CALCIUM 9.2 03/25/2022   ANIONGAP 6 11/02/2021   GFR 39.99 (L) 03/25/2022   Lab Results  Component Value Date   CHOL 273 (H) 03/25/2022   Lab Results  Component Value Date   HDL 72.20 03/25/2022   Lab Results  Component Value Date   LDLCALC 171 (H) 03/25/2022   Lab Results  Component Value Date   TRIG 152.0 (H) 03/25/2022   Lab Results  Component Value Date   CHOLHDL 4 03/25/2022   Lab Results  Component Value Date   HGBA1C 6.3 03/25/2022      Assessment & Plan:   Problem List Items Addressed This Visit     Essential hypertension (Chronic)    Well controlled,  no changes to meds. Encouraged heart healthy diet such as the DASH diet and exercise as tolerated.       Relevant Orders   CBC (Completed)   Comprehensive metabolic panel (Completed)   TSH (Completed)   Hyperlipidemia    Encourage heart healthy diet such as MIND or DASH diet, increase exercise, avoid trans fats, simple carbohydrates and processed foods, consider a krill or fish or flaxseed oil cap daily.       Relevant Orders   Lipid panel (Completed)   Hypothyroid    On Levothyroxine, continue to monitor      Vitamin D deficiency    Supplement and monitor      Hyperglycemia    hgba1c acceptable, minimize simple carbs. Increase exercise as tolerated RSV (respiratory syncitial virus) vaccine at pharmacy, Arexvy Covid booster at pharmacy High dose flu shot  Shingrix is the new shingles shot, 2 shots over 2-6 months, confirm coverage with insurance and document, then can return here for shots with nurse appt or at pharmacy      Relevant Orders   Hemoglobin A1c (Completed)   Nonintractable headache    Has noted worsening left sided headaches over the last few months. She describes pressure behind her eye and some eye pain. Will order a CT head to evaluate and she is referred to a new ophthalmologist as she has been unable to be seen.      Relevant Orders   CT HEAD WO CONTRAST (5MM)   Ambulatory referral to Ophthalmology   Elevated sed rate    monitor      Relevant Orders   Sedimentation rate (Completed)   CHF (congestive heart failure) (HCC)    Stable, no recent exacerbation      Urinary incontinence   Relevant Orders   Ambulatory referral to Urology   Eye pain, left    With some redness, discomfort, itching and change in acuity. Is referred to ophthalmology for further evalaution.       Relevant Orders   Ambulatory referral  to Ophthalmology   Visual changes   Relevant Orders   CT HEAD WO CONTRAST (5MM)   Ambulatory referral to Ophthalmology   Urinary urgency  - Primary    Recurrent UTIs, pelvic pain and urinary urgency, frequency. Referred to urology for further consideration.      Relevant Orders   Ambulatory referral to Urology   Other Visit Diagnoses     Redness, eye       Relevant Orders   Ambulatory referral to Ophthalmology   Influenza vaccine administered       Relevant Orders   Flu Vaccine QUAD High Dose(Fluad) (Completed)      No orders of the defined types were placed in this encounter.  I, Penni Homans, MD, personally preformed the services described in this documentation.  All medical record entries made by the scribe were at my direction and in my presence.  I have reviewed the chart and discharge instructions (if applicable) and agree that the record reflects my personal performance and is accurate and complete. 03/25/2022  I,Mohammed Iqbal,acting as a scribe for Penni Homans, MD.,have documented all relevant documentation on the behalf of Penni Homans, MD,as directed by  Penni Homans, MD while in the presence of Penni Homans, MD.  Penni Homans, MD

## 2022-03-25 NOTE — Assessment & Plan Note (Signed)
With some redness, discomfort, itching and change in acuity. Is referred to ophthalmology for further evalaution.

## 2022-03-25 NOTE — Patient Instructions (Addendum)
RSV (respiratory syncitial virus) vaccine at pharmacy, Arexvy  Covid booster at pharmacy High dose flu shot today

## 2022-03-25 NOTE — Assessment & Plan Note (Signed)
Recurrent UTIs, pelvic pain and urinary urgency, frequency. Referred to urology for further consideration.

## 2022-03-29 ENCOUNTER — Ambulatory Visit (INDEPENDENT_AMBULATORY_CARE_PROVIDER_SITE_OTHER): Payer: Medicare HMO | Admitting: *Deleted

## 2022-03-29 VITALS — BP 119/70 | HR 76 | Ht <= 58 in | Wt 116.6 lb

## 2022-03-29 DIAGNOSIS — Z0000 Encounter for general adult medical examination without abnormal findings: Secondary | ICD-10-CM | POA: Diagnosis not present

## 2022-03-29 NOTE — Progress Notes (Signed)
Subjective:   Julie Wilkerson is a 81 y.o. female who presents for Medicare Annual (Subsequent) preventive examination.  Review of Systems    Defer to PCP Cardiac Risk Factors include: advanced age (>51mn, >>87women);dyslipidemia;hypertension     Objective:    Today's Vitals   03/29/22 1259  BP: 119/70  Pulse: 76  Weight: 116 lb 9.6 oz (52.9 kg)  Height: '4\' 10"'$  (1.473 m)   Body mass index is 24.37 kg/m.     03/29/2022    1:04 PM 11/02/2021   12:40 PM 12/23/2020   10:46 AM 12/19/2020    9:10 AM 04/02/2020   12:16 PM 03/04/2020   10:15 AM 12/12/2019    9:42 AM  Advanced Directives  Does Patient Have a Medical Advance Directive? Yes Yes Yes Yes No No Yes  Type of Advance Directive Living will Living will;Healthcare Power of [place] will [place] will   [place] will  Does patient want to make changes to medical advance directive? No - Patient declined      No - Patient declined  Copy of [place] Chart?       No - copy requested    Current Medications (verified) Outpatient Encounter Medications as of 03/29/2022  Medication Sig   ASPIRIN 81 PO 81 mg daily.    benzonatate (TESSALON) 100 MG capsule 1 tab po tid prn cough   citalopram (CELEXA) 20 MG tablet Take 1 tablet (20 mg total) by mouth at bedtime.   fexofenadine (ALLEGRA ALLERGY) 180 MG tablet Take 1 tablet (180 mg total) by mouth daily as needed for allergies or rhinitis.   fluticasone (FLOVENT HFA) 110 MCG/ACT inhaler Inhale 1-2 puffs into the lungs 2 (two) times daily as needed.   furosemide (LASIX) 40 MG tablet Take 1 tablet by mouth once daily   hyoscyamine (LEVSIN SL) 0.125 MG SL tablet Place 1 tablet (0.125 mg total) under the tongue 2 (two) times daily.   ipratropium (ATROVENT) 0.02 % nebulizer solution Take 2.5 mLs (0.5 mg total) by nebulization every 6 (six) hours as needed for wheezing or shortness of  breath.   levothyroxine (SYNTHROID) 50 MCG tablet TAKE 1 TABLET BY MOUTH ONCE DAILY BEFORE BREAKFAST   lisinopril (ZESTRIL) 10 MG tablet Take 1 tablet by mouth twice daily as needed   metoprolol succinate (TOPROL-XL) 25 MG 24 hr tablet Take 1 tablet (25 mg total) by mouth at bedtime.   nitrofurantoin, macrocrystal-monohydrate, (MACROBID) 100 MG capsule Take 1 capsule (100 mg total) by mouth 2 (two) times daily.   NON FORMULARY as needed (wheezing, SOB). Iprasynt inhaler (unable to find english equivalent med name)   omega-3 acid ethyl esters (LOVAZA) 1 g capsule Take 2 g by mouth daily.   omeprazole (PRILOSEC) 40 MG capsule Take 1 capsule (40 mg total) by mouth in the morning and at bedtime. Take 1 tablet by mouth once to twice daily   ondansetron (ZOFRAN) 4 MG tablet Dissolve 1 tablet under tongue once to twice a day, every day   potassium chloride (KLOR-CON M) 10 MEQ tablet Take 1 tablet (10 mEq total) by mouth daily.   sucralfate (CARAFATE) 1 g tablet Take 1 tablet by mouth twice daily   No facility-administered encounter medications on file as of 03/29/2022.    Allergies (verified) Valium [diazepam] and Albuterol and levalbuterol   History: Past Medical History:  Diagnosis Date   Abdominal pain 11/07/2016   Accelerating  angina ([place]) 80/99/8338   Acute diastolic CHF (congestive heart failure) ([place]) 02/16/2019   Acute on chronic diastolic CHF (congestive heart failure) ([place]) 02/23/2019   Acute pansinusitis 04/26/2017   Amblyopia of eye, left 04/19/2018   Anemia    Anginal pain ([facility])    Anxiety    Arthritis    Asthma    Atypical chest pain 02/04/2019   Bilateral carotid bruits 03/28/2017   Blood transfusion without reported diagnosis    Breast cancer ([place])    2002, left, encapsulated, microcalcifications. lumpectomy, radtiation x 30   Breast cancer in female [facility])    Bursitis of left hip 09/04/2018   Change in bowel habits 02/22/2018   Change in mole 06/08/2016   Chest pain  03/04/2020   CHF (congestive heart failure) (H[facility])    Chronic bilateral thoracic back pain 05/23/2016   Chronic diastolic congestive heart failure ([place]) 05/28/2019   Colitis    Complication of anesthesia    VERY SENSITIVE TO ANESTHESIA    Constipation 01/17/2018   Coronary artery disease involving native coronary artery of native heart without angina pectoris 05/28/2019   Cortical age-related cataract of right eye 04/18/2018   Cough variant asthma  vs UACS from ACEi     Spirometry 12/02/2016  FEV1 1.26 (76%)  Ratio 78 with min curvature p last saba > 6 h prior  - trial off acei 12/02/2016  - 12/02/2016  After extensive coaching HFA effectiveness =    75% with qvar autohaler > rechallenge with 80 2bid     Decreased visual acuity 01/16/2017   Degeneration of lumbar intervertebral disc 08/01/2018   Diarrhea 11/16/2015   D/o Diverticuli, polyps, celiac disease.     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   Left hand pain 05/07/2018   Left hip pain 03/01/2017   Left leg pain 03/01/2017   Left shoulder pain 05/07/2018   Low back pain 08/10/2015   LUQ pain 06/01/2019   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   Neck pain 03/01/2017   Nuclear sclerotic cataract of right eye 04/18/2018   Osteoporosis    Overweight (BMI 25.0-29.9) 02/14/2019   Pain in joint of left shoulder 08/11/2018   Paroxysmal atrial  tachycardia 05/28/2019   Pelvic pain 07/02/2018   Personal history of radiation therapy    Pneumonia    PONV (postoperative nausea and vomiting)    Poor appetite 02/22/2018   Pseudophakia of left eye 04/18/2018   PVD (posterior vitreous detachment), right 04/24/2019   Restless sleeper 11/07/2016   Right hip pain 10/23/2017   RLQ discomfort 03/01/2017   RLS (restless legs syndrome) 05/07/2019   S/P left THA, AA 02/13/2019   Sepsis due to pneumonia ([place]) 02/16/2019   Sleep apnea    uses CPAP   Stomach cramps    Tremor 02/15/2016   Urinary frequency 12/22/2017   Urinary incontinence 05/07/2019  Urinary tract infection 11/04/2016   Vitamin D deficiency 04/29/2016   Past Surgical History:  Procedure Laterality Date   ABDOMINAL HYSTERECTOMY     partial   APPENDECTOMY     BREAST LUMPECTOMY Left 2002   CHOLECYSTECTOMY  Age 23 or 2   COLONOSCOPY  2017   EYE SURGERY Left    cataract   LEFT HEART CATH AND CORONARY ANGIOGRAPHY N/A 02/09/2019   Procedure: LEFT HEART CATH AND CORONARY ANGIOGRAPHY;  Surgeon: Nelva Bush, MD;  Location: [facility];  Service: Cardiovascular;  Laterality: N/A;   TONSILLECTOMY     TOTAL HIP ARTHROPLASTY Left 02/13/2019   Procedure: TOTAL HIP ARTHROPLASTY ANTERIOR APPROACH;  Surgeon: Paralee Cancel, MD;  Location: [facility];  Service: Orthopedics;  Laterality: Left;  70 mins   UPPER GASTROINTESTINAL ENDOSCOPY     Family History  Problem Relation Age of Onset   Colitis Mother    Irritable bowel syndrome Mother    Hypertension Father    Hyperlipidemia Sister    Hyperlipidemia Brother    Heart attack Brother    Hyperlipidemia Brother    Heart attack Brother    Hyperlipidemia Brother    Heart attack Brother    Stomach cancer Paternal Grandmother 87   Colon cancer Paternal 67    Leukemia Daughter    Arthritis Daughter    Heart disease Daughter        ASD vs VSD   Esophageal cancer Neg Hx    Pancreatic cancer Neg Hx    Rectal  cancer Neg Hx    Social History   Socioeconomic History   Marital status: Single    Spouse name: Not on file   Number of children: 3   Years of education: 18   Highest education level: Master's degree (e.g., MA, MS, MEng, MEd, MSW, MBA)  Occupational History   Occupation: retired    Comment: Pharmacist, hospital, kindergarten  Tobacco Use   Smoking status: Never   Smokeless tobacco: Never  Vaping Use   Vaping Use: Never used  Substance and Sexual Activity   Alcohol use: Never    Alcohol/week: 0.0 standard drinks of alcohol   Drug use: Never   Sexual activity: Not Currently    Birth control/protection: None    Comment: lives alone, avoids daiiry and gluten. 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  Other Topics Concern   Not on file  Social History Narrative   Not on file   Social Determinants of Health   Financial Resource Strain: Low Risk  (12/19/2020)   Overall Financial Resource Strain (CARDIA)    Difficulty of Paying Living Expenses: Not hard at all  Food Insecurity: No Food Insecurity (03/29/2022)   Hunger Vital Sign    Worried About Running Out of Food in the Last Year: Never true    Ran Out of Food in the Last Year: Never true  Transportation Needs: No Transportation Needs (03/29/2022)   PRAPARE - Hydrologist (Medical): No    Lack of Transportation (Non-Medical): No  Physical Activity: Insufficiently Active (12/19/2020)   Exercise Vital Sign    Days of Exercise per Week: 1 day    Minutes of Exercise per Session: 30 min  Stress: No Stress Concern Present (12/19/2020)   [place]    Feeling of Stress : Not at all  Social Connections: Moderately Integrated (03/29/2022)   Social Connection and Isolation Panel [NHANES]    Frequency of Communication with Friends  and Family: More than three times a week    Frequency of Social Gatherings with Friends and Family: More than three times a week    Attends  Religious Services: More than 4 times per year    Active Member of [employer] or Organizations: Yes    Attends Music therapist: More than 4 times per year    Marital Status: Divorced    Tobacco Counseling Counseling given: Not Answered   Clinical Intake:  Pre-visit preparation completed: Yes  Pain : No/denies pain  Diabetes: No  How often do you need to have someone help you when you read instructions, pamphlets, or other written materials from your doctor or pharmacy?: 1 - Never   Activities of Daily Living    03/29/2022    1:07 PM  In your present state of health, do you have any difficulty performing the following activities:  Hearing? 0  Vision? 0  Difficulty concentrating or making decisions? 0  Walking or climbing stairs? 0  Dressing or bathing? 0  Doing errands, shopping? 0  Preparing Food and eating ? N  Using the Toilet? N  In the past six months, have you accidently leaked urine? Y  Do you have problems with loss of bowel control? N  Managing your Medications? N  Managing your Finances? N  Housekeeping or managing your Housekeeping? N    Patient Care Team: [name], MD as PCP - General (Family Medicine) Janina Mayo, MD as PCP - Cardiology (Cardiology) Lake Bells., MD as Consulting Physician (Gastroenterology) Belva Bertin, Armida Sans as Physician Assistant (Family Medicine) Day, Melvenia Beam, [facility] (Inactive) as Pharmacist (Pharmacist)  Indicate any recent Medical Services you may have received from other than [facility] providers in the past year (date may be approximate).     Assessment:   This is a routine wellness examination for Hollye.  Hearing/Vision screen No results found.  Dietary issues and exercise activities discussed: Current Exercise Habits: Home exercise routine, Type of exercise: Other - see comments;strength training/weights (eliptical), Time (Minutes): 60, Frequency (Times/Week): 3, Weekly Exercise (Minutes/Week): 180,  Intensity: Mild, Exercise limited by: None identified   Goals Addressed   None    Depression Screen    03/29/2022    1:07 PM 03/25/2022   11:20 AM 12/15/2021   11:55 AM 11/02/2021   11:31 AM 09/04/2021    2:48 PM 08/25/2021    1:31 PM 04/15/2021    4:08 PM  PHQ 2/9 Scores  PHQ - 2 Score 0 0 0 0 0 0 0  PHQ- 9 Score  0 1        Fall Risk    03/29/2022    1:07 PM 03/25/2022   11:19 AM 12/15/2021   11:55 AM 11/02/2021   11:31 AM 09/04/2021    2:48 PM  [place] in the past year? 0 0 0 0 0  Number falls in past yr: 0 0 0 0 0  Injury with Fall? 0 0 0 0 0  Risk for fall due to : No Fall Risks  No Fall Risks    Follow up Falls evaluation completed Falls evaluation completed Falls evaluation completed      [place]:  Any stairs in or around the home? No  If so, are there any without handrails?  No stairs Home free of loose throw rugs in walkways, pet beds, electrical cords, etc? Yes  Adequate lighting in your home to reduce  risk of falls? Yes   ASSISTIVE DEVICES UTILIZED TO PREVENT FALLS:  Life alert? No  Use of a cane, walker or w/c? No  Grab bars in the bathroom? Yes  Shower chair or bench in shower? No  Elevated toilet seat or a handicapped toilet? No   TIMED UP AND GO:  Was the test performed? Yes .  Length of time to ambulate 10 feet: 7 sec.   Gait slow and steady without use of assistive device  Cognitive Function:    06/27/2017    2:25 PM 04/29/2016   11:04 AM  MMSE - Mini Mental State Exam  Orientation to time 5 5  Orientation to Place 5 5  Registration 3 3  Attention/ Calculation 5 5  Recall 3 3  Language- name 2 objects 2 2  Language- repeat 1 1  Language- follow 3 step command 3 3  Language- read & follow direction 1 1  Write a sentence 1 1  Copy design 1 1  Total score 30 30      07/14/2018   11:22 AM  Montreal Cognitive Assessment   Visuospatial/ Executive (0/5) 4  Naming (0/3) 3  Attention: Read list of  digits (0/2) 1  Attention: Read list of letters (0/1) 1  Attention: Serial 7 subtraction starting at 100 (0/3) 3  Language: Repeat phrase (0/2) 1  Language : Fluency (0/1) 0  Abstraction (0/2) 1  Delayed Recall (0/5) 4  Orientation (0/6) 6  Total 24  Adjusted Score (based on education) 24      03/29/2022    1:11 PM 12/12/2019    9:43 AM  6CIT Screen  What Year? 0 points 0 points  What month? 0 points 0 points  What time? 0 points 0 points  Count back from 20 0 points 0 points  Months in reverse 0 points 0 points  Repeat phrase 2 points 0 points  Total Score 2 points 0 points    Immunizations Immunization History  Administered Date(s) Administered   Fluad Quad(high Dose 65+) 02/01/2019, 05/29/2020, 03/17/2021, 03/25/2022   Influenza, High Dose Seasonal PF 03/11/2015, 02/05/2016, 02/05/2017, 03/23/2018   Influenza,inj,quad, With Preservative 05/23/2014, 03/24/2018   PFIZER(Purple Top)SARS-COV-2 Vaccination 06/13/2019, 07/04/2019, 04/22/2020   Pneumococcal Conjugate-13 02/01/2019   Pneumococcal Polysaccharide-23 05/23/2014   Td 02/05/2016   Zoster, Live 05/24/2009    TDAP status: Up to date  Flu Vaccine status: Up to date  Pneumococcal vaccine status: Up to date  Covid-19 vaccine status: Information provided on how to obtain vaccines.   Qualifies for Shingles Vaccine? Yes   Zostavax completed Yes   Shingrix Completed?: No.    Education has been provided regarding the importance of this vaccine. Patient has been advised to call insurance company to determine out of pocket expense if they have not yet received this vaccine. Advised may also receive vaccine at local pharmacy or Health Dept. Verbalized acceptance and understanding.  Screening Tests Health Maintenance  Topic Date Due   Zoster Vaccines- Shingrix (1 of 2) Never done   Medicare Annual Wellness (AWV)  12/19/2021   COVID-19 Vaccine (4 - Pfizer risk series) 05/21/2022 (Originally 06/17/2020)   TETANUS/TDAP   02/04/2026   Pneumonia Vaccine 65+ Years old  Completed   INFLUENZA VACCINE  Completed   DEXA SCAN  Completed   HPV VACCINES  Aged Out    Health Maintenance  Health Maintenance Due  Topic Date Due   Zoster Vaccines- Shingrix (1 of 2) Never done   [employer] (  AWV)  12/19/2021    Colorectal cancer screening: No longer required.   Mammogram status: Completed 07/20/21. Repeat every year  Bone Density status: Completed 09/11/18. Results reflect: Bone density results: NORMAL. Repeat every 2 years.  Lung Cancer Screening: (Low Dose CT Chest recommended if Age 74-80 years, 30 pack-year currently smoking OR have quit w/in 15years.) does not qualify.   Lung Cancer Screening Referral: N/a  Additional Screening:  Hepatitis C Screening: does not qualify  Vision Screening: Recommended annual ophthalmology exams for early detection of glaucoma and other disorders of the eye. Is the patient up to date with their annual eye exam?  Yes  Who is the provider or what is the name of the office in which the patient attends annual eye exams? Dr. Antionette Fairy If pt is not established with a provider, would they like to be referred to a provider to establish care? No .   Dental Screening: Recommended annual dental exams for proper oral hygiene  Community Resource Referral / Chronic Care Management: CRR required this visit?  No   CCM required this visit?  No      Plan:     I have personally reviewed and noted the following in the patient's chart:   Medical and social history Use of alcohol, tobacco or illicit drugs  Current medications and supplements including opioid prescriptions. Patient is not currently taking opioid prescriptions. Functional ability and status Nutritional status Physical activity Advanced directives List of other physicians Hospitalizations, surgeries, and ER visits in previous 12 months Vitals Screenings to include cognitive, depression, and  falls Referrals and appointments  In addition, I have reviewed and discussed with patient certain preventive protocols, quality metrics, and best practice recommendations. A written personalized care plan for preventive services as well as general preventive health recommendations were provided to patient.     Beatris Ship, [place]   03/29/2022   Nurse Notes: None

## 2022-03-29 NOTE — Patient Instructions (Signed)
Julie Wilkerson , Thank you for taking time to come for your Medicare Wellness Visit. I appreciate your ongoing commitment to your health goals. Please review the following plan we discussed and let me know if I can assist you in the future.   These are the goals we discussed:  Goals      Maintain healthy lifestyle     Restart exercising 4x/ week.        This is a list of the screening recommended for you and due dates:  Health Maintenance  Topic Date Due   Zoster (Shingles) Vaccine (1 of 2) Never done   COVID-19 Vaccine (4 - Pfizer risk series) 05/21/2022*   Medicare Annual Wellness Visit  03/30/2023   Tetanus Vaccine  02/04/2026   Pneumonia Vaccine  Completed   Flu Shot  Completed   DEXA scan (bone density measurement)  Completed   HPV Vaccine  Aged Out  *Topic was postponed. The date shown is not the original due date.     Next appointment: Follow up in one year for your annual wellness visit.   Preventive Care 41 Years and Older, Female Preventive care refers to lifestyle choices and visits with your health care provider that can promote health and wellness. What does preventive care include? A yearly physical exam. This is also called an annual well check. Dental exams once or twice a year. Routine eye exams. Ask your health care provider how often you should have your eyes checked. Personal lifestyle choices, including: Daily care of your teeth and gums. Regular physical activity. Eating a healthy diet. Avoiding tobacco and drug use. Limiting alcohol use. Practicing safe sex. Taking low-dose aspirin every day. Taking vitamin and mineral supplements as recommended by your health care provider. What happens during an annual well check? The services and screenings done by your health care provider during your annual well check will depend on your age, overall health, lifestyle risk factors, and family history of disease. Counseling  Your health care provider may ask you  questions about your: Alcohol use. Tobacco use. Drug use. Emotional well-being. Home and relationship well-being. Sexual activity. Eating habits. History of falls. Memory and ability to understand (cognition). Work and work Statistician. Reproductive health. Screening  You may have the following tests or measurements: Height, weight, and BMI. Blood pressure. Lipid and cholesterol levels. These may be checked every 5 years, or more frequently if you are over 77 years old. Skin check. Lung cancer screening. You may have this screening every year starting at age 41 if you have a 30-pack-year history of smoking and currently smoke or have quit within the past 15 years. Fecal occult blood test (FOBT) of the stool. You may have this test every year starting at age 34. Flexible sigmoidoscopy or colonoscopy. You may have a sigmoidoscopy every 5 years or a colonoscopy every 10 years starting at age 28. Hepatitis C blood test. Hepatitis B blood test. Sexually transmitted disease (STD) testing. Diabetes screening. This is done by checking your blood sugar (glucose) after you have not eaten for a while (fasting). You may have this done every 1-3 years. Bone density scan. This is done to screen for osteoporosis. You may have this done starting at age 35. Mammogram. This may be done every 1-2 years. Talk to your health care provider about how often you should have regular mammograms. Talk with your health care provider about your test results, treatment options, and if necessary, the need for more tests. Vaccines  Your  health care provider may recommend certain vaccines, such as: Influenza vaccine. This is recommended every year. Tetanus, diphtheria, and acellular pertussis (Tdap, Td) vaccine. You may need a Td booster every 10 years. Zoster vaccine. You may need this after age 64. Pneumococcal 13-valent conjugate (PCV13) vaccine. One dose is recommended after age 53. Pneumococcal polysaccharide  (PPSV23) vaccine. One dose is recommended after age 33. Talk to your health care provider about which screenings and vaccines you need and how often you need them. This information is not intended to replace advice given to you by your health care provider. Make sure you discuss any questions you have with your health care provider. Document Released: 06/06/2015 Document Revised: 01/28/2016 Document Reviewed: 03/11/2015 Elsevier Interactive Patient Education  2017 [place] Prevention in the Home Falls can cause injuries. They can happen to people of all ages. There are many things you can do to make your home safe and to help prevent falls. What can I do on the outside of my home? Regularly fix the edges of walkways and driveways and fix any cracks. Remove anything that might make you trip as you walk through a door, such as a raised step or threshold. Trim any bushes or trees on the path to your home. Use bright outdoor lighting. Clear any walking paths of anything that might make someone trip, such as rocks or tools. Regularly check to see if handrails are loose or broken. Make sure that both sides of any steps have handrails. Any raised decks and porches should have guardrails on the edges. Have any leaves, snow, or ice cleared regularly. Use sand or salt on walking paths during winter. Clean up any spills in your garage right away. This includes oil or grease spills. What can I do in the bathroom? Use night lights. Install grab bars by the toilet and in the tub and shower. Do not use towel bars as grab bars. Use non-skid mats or decals in the tub or shower. If you need to sit down in the shower, use a plastic, non-slip stool. Keep the floor dry. Clean up any water that spills on the floor as soon as it happens. Remove soap buildup in the tub or shower regularly. Attach bath mats securely with double-sided non-slip rug tape. Do not have throw rugs and other things on the  floor that can make you trip. What can I do in the bedroom? Use night lights. Make sure that you have a light by your bed that is easy to reach. Do not use any sheets or blankets that are too big for your bed. They should not hang down onto the floor. Have a firm chair that has side arms. You can use this for support while you get dressed. Do not have throw rugs and other things on the floor that can make you trip. What can I do in the kitchen? Clean up any spills right away. Avoid walking on wet floors. Keep items that you use a lot in easy-to-reach places. If you need to reach something above you, use a strong step stool that has a grab bar. Keep electrical cords out of the way. Do not use floor polish or wax that makes floors slippery. If you must use wax, use non-skid floor wax. Do not have throw rugs and other things on the floor that can make you trip. What can I do with my stairs? Do not leave any items on the stairs. Make sure that there are handrails  on both sides of the stairs and use them. Fix handrails that are broken or loose. Make sure that handrails are as long as the stairways. Check any carpeting to make sure that it is firmly attached to the stairs. Fix any carpet that is loose or worn. Avoid having throw rugs at the top or bottom of the stairs. If you do have throw rugs, attach them to the floor with carpet tape. Make sure that you have a light switch at the top of the stairs and the bottom of the stairs. If you do not have them, ask someone to add them for you. What else can I do to help prevent falls? Wear shoes that: Do not have high heels. Have rubber bottoms. Are comfortable and fit you well. Are closed at the toe. Do not wear sandals. If you use a stepladder: Make sure that it is fully opened. Do not climb a closed stepladder. Make sure that both sides of the stepladder are locked into place. Ask someone to hold it for you, if possible. Clearly mark and make  sure that you can see: Any grab bars or handrails. First and last steps. Where the edge of each step is. Use tools that help you move around (mobility aids) if they are needed. These include: Canes. Walkers. Scooters. Crutches. Turn on the lights when you go into a dark area. Replace any light bulbs as soon as they burn out. Set up your furniture so you have a clear path. Avoid moving your furniture around. If any of your floors are uneven, fix them. If there are any pets around you, be aware of where they are. Review your medicines with your doctor. Some medicines can make you feel dizzy. This can increase your chance of falling. Ask your doctor what other things that you can do to help prevent falls. This information is not intended to replace advice given to you by your health care provider. Make sure you discuss any questions you have with your health care provider. Document Released: 03/06/2009 Document Revised: 10/16/2015 Document Reviewed: 06/14/2014 Elsevier Interactive Patient Education  2017 [employer].

## 2022-04-01 ENCOUNTER — Ambulatory Visit (HOSPITAL_BASED_OUTPATIENT_CLINIC_OR_DEPARTMENT_OTHER): Payer: Medicare HMO

## 2022-04-01 DIAGNOSIS — H04123 Dry eye syndrome of bilateral lacrimal glands: Secondary | ICD-10-CM | POA: Diagnosis not present

## 2022-04-01 DIAGNOSIS — H0288A Meibomian gland dysfunction right eye, upper and lower eyelids: Secondary | ICD-10-CM | POA: Diagnosis not present

## 2022-04-01 DIAGNOSIS — H0288B Meibomian gland dysfunction left eye, upper and lower eyelids: Secondary | ICD-10-CM | POA: Diagnosis not present

## 2022-04-04 ENCOUNTER — Encounter: Payer: Self-pay | Admitting: Family Medicine

## 2022-04-04 ENCOUNTER — Other Ambulatory Visit: Payer: Self-pay | Admitting: Family Medicine

## 2022-04-05 MED ORDER — LISINOPRIL 10 MG PO TABS: 10.0000 mg | ORAL_TABLET | Freq: Two times a day (BID) | ORAL | 0 refills | Status: DC | PRN

## 2022-04-06 NOTE — Telephone Encounter (Signed)
Called pt was advised to stop medication  stated understand and recheck Bp  Appt made

## 2022-04-08 ENCOUNTER — Ambulatory Visit: Payer: Medicare HMO | Admitting: Pulmonary Disease

## 2022-04-08 ENCOUNTER — Encounter: Payer: Self-pay | Admitting: Pulmonary Disease

## 2022-04-08 VITALS — BP 122/64 | HR 58 | Ht <= 58 in | Wt 115.8 lb

## 2022-04-08 DIAGNOSIS — G4733 Obstructive sleep apnea (adult) (pediatric): Secondary | ICD-10-CM

## 2022-04-08 NOTE — Progress Notes (Signed)
Synopsis: Referred in October 2022 for evaluation of asthma by Penni Homans  Subjective:   PATIENT ID: Julie Wilkerson, Julie Wilkerson  HPI  Chief Complaint  Patient presents with   Follow-up    F/U after having COVID and bronchitis. Had COVID at the beginning of October 2023. Still has a productive cough with clear phlegm. Increased chest tightness. States she is unable to use her cpap due to constant headaches and facial pain.    Julie Wilkerson is a 81 year old woman, never smoker with history of hypertension, congestive heart failure, coronary artery disease and asthma who returns to pulmonary clinic for asthma.   She had covid in August when she went to visit her family in [place]. She went to the ER for evaluation. She took many weeks to recover but reports cough over recent weeks with chest discomfort. She reports easily tiring. She is trying to go to the gym to exercise which has helped a bit. She continues to have headache.   She is using flovent twice daily.   The chest discomfort is reproducible if she presses on her chest.   OV 12/25/21 She reports her breathing has been stable since last visit. She reports left sided back pain and had injection yesterday which her pain is improved.   She reports having low heart rate over the past couple of months. She remains on metoprolol succinate '25mg'$  nightly.   She is planning to go to [place] for 6 weeks to see family.   OV 08/24/21 She reports 3 weeks ago she was having chest pains and then developed pain in her back as well. She saw her primary care doctor. No issues on X-ray, then had HRCT chest on 08/12/21 which showed air trapping concerning for small airways disease and some bland basilar scarring. She then started using her flovent inhaler 2 puffs twice daily with improvement of the chest pressure and shortness of breath.  She currently denies any symptoms.  OV 03/30/2021 She had pulmonary function  tests on 11/2 which were within normal limits but she had reaction to the levalbuterol medication with increased anxiety, tremors, chest tightness and palpitations. This resolved with time.   She has no further complaints since last visit.   OV 03/05/21 She reports having cough for 4 months that has been treated with steroids and antibiotics with complete resolution. She does have some intermittent wheezing and exertional dyspnea. She denies seasonal allergies that affect her breathing. Cold air and cigarette smoke bother her breathing. She has an as needed albuterol inhaler but this causes her to shake and feel jittery.   Cough for 4 months. Several rounds of steroids and antibiotics.   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 lumpectomy, radtiation x 30   Breast cancer in female [facility])    Bursitis of left hip 09/04/2018   Change in bowel habits 02/22/2018   Change  in mole 06/08/2016   Chest pain 03/04/2020   CHF (congestive heart failure) (HCC)    Chronic bilateral thoracic back pain 05/23/2016   Chronic diastolic congestive heart failure ([place]) 05/28/2019   Colitis    Complication of anesthesia    VERY SENSITIVE TO ANESTHESIA    Constipation 01/17/2018   Coronary artery disease involving native coronary artery of native heart without angina pectoris 05/28/2019   Cortical age-related cataract of right eye 04/18/2018   Cough variant asthma  vs UACS from  ACEi     Spirometry 12/02/2016  FEV1 1.26 (76%)  Ratio 78 with min curvature p last saba > 6 h prior  - trial off acei 12/02/2016  - 12/02/2016  After extensive coaching HFA effectiveness =    75% with qvar autohaler > rechallenge with 80 2bid     Decreased visual acuity 01/16/2017   Degeneration of lumbar intervertebral disc 08/01/2018   Diarrhea 11/16/2015   D/o Diverticuli, polyps, celiac disease.     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   Left hand pain 05/07/2018   Left hip pain 03/01/2017   Left leg pain 03/01/2017   Left shoulder pain 05/07/2018   Low back pain 08/10/2015   LUQ pain 06/01/2019   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   Neck pain 03/01/2017   Nuclear sclerotic cataract of right eye 04/18/2018   Osteoporosis    Overweight (BMI 25.0-29.9) 02/14/2019   Pain in joint of left shoulder 08/11/2018   Paroxysmal atrial tachycardia 05/28/2019   Pelvic pain 07/02/2018   Personal history of radiation therapy    Pneumonia    PONV (postoperative nausea and vomiting)    Poor appetite 02/22/2018   Pseudophakia of left eye 04/18/2018   PVD (posterior vitreous detachment), right 04/24/2019   Restless sleeper 11/07/2016   Right hip pain 10/23/2017   RLQ discomfort 03/01/2017   RLS (restless legs syndrome) 05/07/2019   S/P left THA, AA 02/13/2019   Sepsis due to pneumonia ([place]) 02/16/2019    Sleep apnea    uses CPAP   Stomach cramps    Tremor 02/15/2016   Urinary frequency 12/22/2017   Urinary incontinence 05/07/2019   Urinary tract infection 11/04/2016   Vitamin D deficiency 04/29/2016     Family History  Problem Relation Age of Onset   Colitis Mother    Irritable bowel syndrome Mother    Hypertension Father    Hyperlipidemia Sister    Hyperlipidemia Brother    Heart attack Brother    Hyperlipidemia Brother    Heart attack Brother    Hyperlipidemia Brother    Heart attack Brother    Stomach cancer Paternal Grandmother 16   Colon cancer Paternal Grandmother    Leukemia Daughter    Arthritis Daughter    Heart disease Daughter        ASD vs VSD   Esophageal cancer  Neg Hx    Pancreatic cancer Neg Hx    Rectal cancer Neg Hx      Social History   Socioeconomic History   Marital status: Single    Spouse name: Not on file   Number of children: 3   Years of education: 64   Highest education level: Master's degree (e.g., MA, MS, MEng, MEd, MSW, MBA)  Occupational History   Occupation: retired    Comment: Pharmacist, hospital, kindergarten  Tobacco Use   Smoking status: Never   Smokeless tobacco: Never  Vaping Use   Vaping Use: Never used  Substance and Sexual Activity   Alcohol use: Never    Alcohol/week: 0.0 standard drinks of alcohol   Drug use: Never   Sexual activity: Not Currently    Birth control/protection: None    Comment: lives alone, avoids daiiry and gluten. 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  Other Topics Concern   Not on file  Social History Narrative   Not on file   Social Determinants of Health   Financial Resource Strain: Low Risk  (12/19/2020)   Overall Financial Resource Strain (CARDIA)    Difficulty of Paying Living Expenses: Not hard at all  Food Insecurity: No Food Insecurity (03/29/2022)   Hunger Vital Sign    Worried About Running Out of Food in the Last Year: Never true    Ran Out of Food in the Last Year: Never true  Transportation Needs: No  Transportation Needs (03/29/2022)   PRAPARE - Hydrologist (Medical): No    Lack of Transportation (Non-Medical): No  Physical Activity: Insufficiently Active (12/19/2020)   Exercise Vital Sign    Days of Exercise per Week: 1 day    Minutes of Exercise per Session: 30 min  Stress: No Stress Concern Present (12/19/2020)   [place]    Feeling of Stress : Not at all  Social Connections: Moderately Integrated (03/29/2022)   Social Connection and Isolation Panel [NHANES]    Frequency of Communication with Friends and Family: More than three times a week    Frequency of Social Gatherings with Friends and Family: More than three times a week    Attends Religious Services: More than 4 times per year    Active Member of [employer] or Organizations: Yes    Attends Music therapist: More than 4 times per year    Marital Status: Divorced  Intimate Partner Violence: Not At Risk (12/19/2020)   Humiliation, Afraid, Rape, and Kick questionnaire    Fear of Current or Ex-Partner: No    Emotionally Abused: No    Physically Abused: No    Sexually Abused: No     Allergies  Allergen Reactions   Valium [Diazepam] Shortness Of Breath    Sob and bradycardia   Albuterol And Levalbuterol Palpitations    Episode of palpitations, tremor and anxiety when administered levalbuterol for PFT. Recommend avoiding this medicaiton class     Outpatient Medications Prior to Visit  Medication Sig Dispense Refill   ASPIRIN 81 PO 81 mg daily.      citalopram (CELEXA) 20 MG tablet Take 1 tablet (20 mg total) by mouth at bedtime. 90 tablet 0   fexofenadine (ALLEGRA ALLERGY) 180 MG tablet Take 1 tablet (180 mg total) by mouth daily as needed for allergies or rhinitis.     fluticasone (FLOVENT HFA) 110 MCG/ACT inhaler Inhale 1-2 puffs into the lungs 2 (two) times daily as needed. 1 each  12   furosemide (LASIX) 40 MG tablet  Take 1 tablet by mouth once daily 90 tablet 0   hyoscyamine (LEVSIN SL) 0.125 MG SL tablet Place 1 tablet (0.125 mg total) under the tongue 2 (two) times daily. 30 tablet 1   ipratropium (ATROVENT) 0.02 % nebulizer solution Take 2.5 mLs (0.5 mg total) by nebulization every 6 (six) hours as needed for wheezing or shortness of breath. 75 mL 12   levothyroxine (SYNTHROID) 50 MCG tablet TAKE 1 TABLET BY MOUTH ONCE DAILY BEFORE BREAKFAST 90 tablet 0   lisinopril (ZESTRIL) 10 MG tablet Take 1 tablet (10 mg total) by mouth 2 (two) times daily as needed. 180 tablet 0   metoprolol succinate (TOPROL-XL) 25 MG 24 hr tablet Take 1 tablet (25 mg total) by mouth at bedtime. 90 tablet 0   NON FORMULARY as needed (wheezing, SOB). Iprasynt inhaler (unable to find english equivalent med name)     omega-3 acid ethyl esters (LOVAZA) 1 g capsule Take 2 g by mouth daily.     omeprazole (PRILOSEC) 40 MG capsule Take 1 capsule (40 mg total) by mouth in the morning and at bedtime. Take 1 tablet by mouth once to twice daily 60 capsule 5   ondansetron (ZOFRAN) 4 MG tablet Dissolve 1 tablet under tongue once to twice a day, every day 90 tablet 3   potassium chloride (KLOR-CON M) 10 MEQ tablet Take 1 tablet (10 mEq total) by mouth daily. 90 tablet 1   sucralfate (CARAFATE) 1 g tablet Take 1 tablet by mouth twice daily 60 tablet 0   benzonatate (TESSALON) 100 MG capsule 1 tab po tid prn cough 30 capsule 0   nitrofurantoin, macrocrystal-monohydrate, (MACROBID) 100 MG capsule Take 1 capsule (100 mg total) by mouth 2 (two) times daily. 14 capsule 0   No facility-administered medications prior to visit.    Review of Systems  Constitutional:  Positive for malaise/fatigue. Negative for chills, fever and weight loss.  HENT:  Negative for congestion, sinus pain and sore throat.   Eyes: Negative.   Respiratory:  Positive for cough. Negative for hemoptysis, sputum production, shortness of breath and wheezing.   Cardiovascular:   Positive for chest pain. Negative for palpitations, orthopnea, claudication and leg swelling.  Gastrointestinal:  Negative for abdominal pain, heartburn, nausea and vomiting.  Genitourinary: Negative.   Musculoskeletal:  Negative for joint pain and myalgias.  Skin:  Negative for rash.  Neurological:  Negative for weakness.  Endo/Heme/Allergies: Negative.   Psychiatric/Behavioral:  The patient is not nervous/anxious.     Objective:   Vitals:   04/08/22 1000  BP: 122/64  Pulse: (!) 58  SpO2: 97%  Weight: 115 lb 12.8 oz (52.5 kg)  Height: '4\' 10"'$  (1.473 m)   Physical Exam Constitutional:      General: She is not in acute distress.    Appearance: She is not ill-appearing.  HENT:     Head: Normocephalic and atraumatic.  Eyes:     General: No scleral icterus. Cardiovascular:     Rate and Rhythm: Normal rate and regular rhythm.     Pulses: Normal pulses.     Heart sounds: Normal heart sounds. No murmur heard. Pulmonary:     Effort: Pulmonary effort is normal.     Breath sounds: Normal breath sounds. No wheezing, rhonchi or rales.  Musculoskeletal:     Right lower leg: No edema.     Left lower leg: No edema.  Skin:    General: Skin is warm  and dry.  Neurological:     General: No focal deficit present.     Mental Status: She is alert.    CBC    Component Value Date/Time   WBC 5.1 03/25/2022 1234   RBC 3.90 03/25/2022 1234   HGB 11.2 (L) 03/25/2022 1234   HGB 11.0 (L) 02/05/2019 1058   HCT 34.1 (L) 03/25/2022 1234   HCT 33.7 (L) 02/05/2019 1058   PLT 206.0 03/25/2022 1234   PLT 227 02/05/2019 1058   MCV 87.4 03/25/2022 1234   MCV 89 02/05/2019 1058   MCH 30.0 11/02/2021 1540   MCHC 32.9 03/25/2022 1234   RDW 15.2 03/25/2022 1234   RDW 14.0 02/05/2019 1058   LYMPHSABS 1.5 02/04/2022 1001   MONOABS 0.6 02/04/2022 1001   EOSABS 0.0 02/04/2022 1001   BASOSABS 0.0 02/04/2022 1001      Latest Ref Rng & Units 03/25/2022   12:34 PM 02/04/2022   10:01 AM 12/15/2021    12:39 PM  BMP  Glucose 70 - 99 mg/dL 86  97  89   BUN 6 - 23 mg/dL 29  35  34   Creatinine 0.40 - 1.20 mg/dL 1.27  1.31  1.31   Sodium 135 - 145 mEq/L 140  140  137   Potassium 3.5 - 5.1 mEq/L 4.6  4.3  4.7   Chloride 96 - 112 mEq/L 101  105  100   CO2 19 - 32 mEq/L '29  25  27   '$ Calcium 8.4 - 10.5 mg/dL 9.2  8.7  9.3    Chest imaging: HRCT Chest 08/12/21 1. Predominantly bandlike, bland appearing post infectious or inflammatory scarring of the bilateral lung bases. No evidence of fibrotic interstitial lung disease. 2. Lobular air trapping on expiratory phase imaging, consistent with small airways disease. This mosaic attenuation likely explains ground-glass opacity reported on prior CT angiogram, performed in expiratory phase. 3. Coronary artery disease. 4. Moderate hiatal hernia.  CTA Chest 12/23/20 Negative for significant acute pulmonary embolus by CTA. No other acute intrathoracic vascular finding. Diffuse bilateral patchy ground-glass opacities, nonspecific. See above comment. Moderate hiatal hernia Aortic Atherosclerosis  PFT:    Latest Ref Rng & Units 03/25/2021    3:58 PM  PFT Results  FVC-Pre L 1.64   FVC-Predicted Pre % 94   FVC-Post L 1.68   FVC-Predicted Post % 96   Pre FEV1/FVC % % 84   Post FEV1/FCV % % 88   FEV1-Pre L 1.38   FEV1-Predicted Pre % 108   FEV1-Post L 1.47   DLCO uncorrected ml/min/mmHg 10.75   DLCO UNC% % 76   DLCO corrected ml/min/mmHg 11.62   DLCO COR %Predicted % 82   DLVA Predicted % 99   TLC L 3.14   TLC % Predicted % 81   RV % Predicted % 70   PFT 2022 is within normal limits.   Spirometry 2018 FVC: 1.6L (72%) FEV1: 1.2L (76%) FEV1/FVC 78%  Labs:  Path:  Echo 01/13/21: LV EF 60-65%. LV diastolic parameters are normal. RV systolic function is normal. RV size is normal.  No valvular issues.   Heart Catheterization:  Assessment & Plan:   Obstructive sleep apnea - Plan: Ambulatory referral to  Pulmonology  Discussion: Jasalyn Frysinger is a 81 year old woman, never smoker with history of hypertension, congestive heart failure, coronary artery disease and asthma who returns to pulmonary clinic for asthma.   She has mild intermittent asthma. She has lingering cough after recent covid infection.  She is to continue flovent 113mg inhaler 2 puffs twice daily during symptomatic periods.   She can take tylenol as needed for her costochondritis.   She is intolerant of albuterol or xopenex due to tachycardia and jitteriness.  Follow up in 6 months.  JFreda Jackson MD [facility] Office: [phone]  Current Outpatient Medications:    ASPIRIN 81 PO, 81 mg daily. , Disp: , Rfl:    citalopram (CELEXA) 20 MG tablet, Take 1 tablet (20 mg total) by mouth at bedtime., Disp: 90 tablet, Rfl: 0   fexofenadine (ALLEGRA ALLERGY) 180 MG tablet, Take 1 tablet (180 mg total) by mouth daily as needed for allergies or rhinitis., Disp: , Rfl:    fluticasone (FLOVENT HFA) 110 MCG/ACT inhaler, Inhale 1-2 puffs into the lungs 2 (two) times daily as needed., Disp: 1 each, Rfl: 12   furosemide (LASIX) 40 MG tablet, Take 1 tablet by mouth once daily, Disp: 90 tablet, Rfl: 0   hyoscyamine (LEVSIN SL) 0.125 MG SL tablet, Place 1 tablet (0.125 mg total) under the tongue 2 (two) times daily., Disp: 30 tablet, Rfl: 1   ipratropium (ATROVENT) 0.02 % nebulizer solution, Take 2.5 mLs (0.5 mg total) by nebulization every 6 (six) hours as needed for wheezing or shortness of breath., Disp: 75 mL, Rfl: 12   levothyroxine (SYNTHROID) 50 MCG tablet, TAKE 1 TABLET BY MOUTH ONCE DAILY BEFORE BREAKFAST, Disp: 90 tablet, Rfl: 0   lisinopril (ZESTRIL) 10 MG tablet, Take 1 tablet (10 mg total) by mouth 2 (two) times daily as needed., Disp: 180 tablet, Rfl: 0   metoprolol succinate (TOPROL-XL) 25 MG 24 hr tablet, Take 1 tablet (25 mg total) by mouth at bedtime., Disp: 90 tablet, Rfl: 0   NON FORMULARY, as  needed (wheezing, SOB). Iprasynt inhaler (unable to find english equivalent med name), Disp: , Rfl:    omega-3 acid ethyl esters (LOVAZA) 1 g capsule, Take 2 g by mouth daily., Disp: , Rfl:    omeprazole (PRILOSEC) 40 MG capsule, Take 1 capsule (40 mg total) by mouth in the morning and at bedtime. Take 1 tablet by mouth once to twice daily, Disp: 60 capsule, Rfl: 5   ondansetron (ZOFRAN) 4 MG tablet, Dissolve 1 tablet under tongue once to twice a day, every day, Disp: 90 tablet, Rfl: 3   potassium chloride (KLOR-CON M) 10 MEQ tablet, Take 1 tablet (10 mEq total) by mouth daily., Disp: 90 tablet, Rfl: 1   sucralfate (CARAFATE) 1 g tablet, Take 1 tablet by mouth twice daily, Disp: 60 tablet, Rfl: 0

## 2022-04-08 NOTE — Patient Instructions (Addendum)
We will refer you to our sleep medicine team for sleep apnea and CPAP follow up  You can take tylenol as needed for the chest discomfort which is likely costochondritis.   Continue to use flovent inhaler 1-2 puffs twice daily as needed  Follow up in 6 months

## 2022-04-09 ENCOUNTER — Ambulatory Visit (HOSPITAL_BASED_OUTPATIENT_CLINIC_OR_DEPARTMENT_OTHER)
Admission: RE | Admit: 2022-04-09 | Discharge: 2022-04-09 | Disposition: A | Discharge status: Home / Self Care | Payer: Medicare HMO | Source: Ambulatory Visit | Attending: Family Medicine | Admitting: Family Medicine

## 2022-04-09 DIAGNOSIS — R519 Headache, unspecified: Secondary | ICD-10-CM | POA: Insufficient documentation

## 2022-04-09 DIAGNOSIS — H539 Unspecified visual disturbance: Secondary | ICD-10-CM | POA: Diagnosis not present

## 2022-04-13 ENCOUNTER — Other Ambulatory Visit: Payer: Self-pay | Admitting: Family Medicine

## 2022-04-14 ENCOUNTER — Ambulatory Visit (INDEPENDENT_AMBULATORY_CARE_PROVIDER_SITE_OTHER): Payer: Medicare HMO

## 2022-04-14 ENCOUNTER — Encounter: Payer: Self-pay | Admitting: Pulmonary Disease

## 2022-04-14 DIAGNOSIS — I10 Essential (primary) hypertension: Secondary | ICD-10-CM

## 2022-04-14 NOTE — Progress Notes (Signed)
Pt here for Blood pressure check per Dr.Blyth lisinopril now she needs a nurse BP and pulse check in the next couple of weeks  Per pulmonologist to stop Metoprolol   Pt currently takes:   Aspirin 81 mg daily    Pt reports compliance with medication.  BP today @ =120/64 HR =58  Pt advised per : Follow up with PCP in 3 Months

## 2022-04-16 ENCOUNTER — Encounter: Payer: Self-pay | Admitting: Primary Care

## 2022-04-16 ENCOUNTER — Ambulatory Visit (INDEPENDENT_AMBULATORY_CARE_PROVIDER_SITE_OTHER): Payer: Medicare HMO | Admitting: Primary Care

## 2022-04-16 VITALS — BP 124/64 | HR 74 | Temp 97.4°F | Ht <= 58 in | Wt 115.4 lb

## 2022-04-16 DIAGNOSIS — G4730 Sleep apnea, unspecified: Secondary | ICD-10-CM | POA: Diagnosis not present

## 2022-04-16 DIAGNOSIS — J329 Chronic sinusitis, unspecified: Secondary | ICD-10-CM | POA: Diagnosis not present

## 2022-04-16 DIAGNOSIS — G4733 Obstructive sleep apnea (adult) (pediatric): Secondary | ICD-10-CM

## 2022-04-16 NOTE — Assessment & Plan Note (Signed)
-   She has been having difficulty tolerating CPAP d.t sinus discomfort. No signs of active bacterial sinus infection. Recommend patient try decreasing flonase use as it could be irritating her nasal passages, recommend adding ocean saline nasal spray 1-2 times a day and ARY nasal gel. No indications for abx at this time. Could consider prednisone course for sinusitis symptoms before referral to ENT

## 2022-04-16 NOTE — Patient Instructions (Addendum)
Download shows actual good control of apneic events on current pressure settings She is having a large amount of air leaks Would recommend mask desensitization versus a CPAP titration study in lab We can try lower pressure settings at next visit if new mask does not help   Try using Flonase once a day or every other day, frequent use could be irritating nasal passages (could also try Sensi mist); recommend you use Ocean saline nasal spray 1-2 times daily; look into AYR nasal gel to moisten nasal massages   Follow-up: 6 weeks with Beth NP  CPAP and BIPAP Information CPAP and BIPAP are methods that use air pressure to keep your airways open and to help you breathe well. CPAP and BIPAP use different amounts of pressure. Your health care provider will tell you whether CPAP or BIPAP would be more helpful for you. CPAP stands for "continuous positive airway pressure." With CPAP, the amount of pressure stays the same while you breathe in (inhale) and out (exhale). BIPAP stands for "bi-level positive airway pressure." With BIPAP, the amount of pressure will be higher when you inhale and lower when you exhale. This allows you to take larger breaths. CPAP or BIPAP may be used in the hospital, or your health care provider may want you to use it at home. You may need to have a sleep study before your health care provider can order a machine for you to use at home. What are the advantages? CPAP or BIPAP can be helpful if you have: Sleep apnea. Chronic obstructive pulmonary disease (COPD). Heart failure. Medical conditions that cause muscle weakness, including muscular dystrophy or amyotrophic lateral sclerosis (ALS). Other problems that cause breathing to be shallow, weak, abnormal, or difficult. CPAP and BIPAP are most commonly used for obstructive sleep apnea (OSA) to keep the airways from collapsing when the muscles relax during sleep. What are the risks? Generally, this is a safe treatment. However,  problems may occur, including: Irritated skin or skin sores if the mask does not fit properly. Dry or stuffy nose or nosebleeds. Dry mouth. Feeling gassy or bloated. Sinus or lung infection if the equipment is not cleaned properly. When should CPAP or BIPAP be used? In most cases, the mask only needs to be worn during sleep. Generally, the mask needs to be worn throughout the night and during any daytime naps. People with certain medical conditions may also need to wear the mask at other times, such as when they are awake. Follow instructions from your health care provider about when to use the machine. What happens during CPAP or BIPAP?  Both CPAP and BIPAP are provided by a small machine with a flexible plastic tube that attaches to a plastic mask that you wear. Air is blown through the mask into your nose or mouth. The amount of pressure that is used to blow the air can be adjusted on the machine. Your health care provider will set the pressure setting and help you find the best mask for you. Tips for using the mask Because the mask needs to be snug, some people feel trapped or closed-in (claustrophobic) when first using the mask. If you feel this way, you may need to get used to the mask. One way to do this is to hold the mask loosely over your nose or mouth and then gradually apply the mask more snugly. You can also gradually increase the amount of time that you use the mask. Masks are available in various types and sizes.  If your mask does not fit well, talk with your health care provider about getting a different one. Some common types of masks include: Full face masks, which fit over the mouth and nose. Nasal masks, which fit over the nose. Nasal pillow or prong masks, which fit into the nostrils. If you are using a mask that fits over your nose and you tend to breathe through your mouth, a chin strap may be applied to help keep your mouth closed. Use a skin barrier to protect your skin as  told by your health care provider. Some CPAP and BIPAP machines have alarms that may sound if the mask comes off or develops a leak. If you have trouble with the mask, it is very important that you talk with your health care provider about finding a way to make the mask easier to tolerate. Do not stop using the mask. There could be a negative impact on your health if you stop using the mask. Tips for using the machine Place your CPAP or BIPAP machine on a secure table or stand near an electrical outlet. Know where the on/off switch is on the machine. Follow instructions from your health care provider about how to set the pressure on your machine and when you should use it. Do not eat or drink while the CPAP or BIPAP machine is on. Food or fluids could get pushed into your lungs by the pressure of the CPAP or BIPAP. For home use, CPAP and BIPAP machines can be rented or purchased through home health care companies. Many different brands of machines are available. Renting a machine before purchasing may help you find out which particular machine works well for you. Your health insurance company may also decide which machine you may get. Keep the CPAP or BIPAP machine and attachments clean. Ask your health care provider for specific instructions. Check the humidifier if you have a dry stuffy nose or nosebleeds. Make sure it is working correctly. Follow these instructions at home: Take over-the-counter and prescription medicines only as told by your health care provider. Ask if you can take sinus medicine if your sinuses are blocked. Do not use any products that contain nicotine or tobacco. These products include cigarettes, chewing tobacco, and vaping devices, such as e-cigarettes. If you need help quitting, ask your health care provider. Keep all follow-up visits. This is important. Contact a health care provider if: You have redness or pressure sores on your head, face, mouth, or nose from the mask or  head gear. You have trouble using the CPAP or BIPAP machine. You cannot tolerate wearing the CPAP or BIPAP mask. Someone tells you that you snore even when wearing your CPAP or BIPAP. Get help right away if: You have trouble breathing. You feel confused. Summary CPAP and BIPAP are methods that use air pressure to keep your airways open and to help you breathe well. If you have trouble with the mask, it is very important that you talk with your health care provider about finding a way to make the mask easier to tolerate. Do not stop using the mask. There could be a negative impact to your health if you stop using the mask. Follow instructions from your health care provider about when to use the machine. This information is not intended to replace advice given to you by your health care provider. Make sure you discuss any questions you have with your health care provider. Document Revised: 12/17/2020 Document Reviewed: 04/18/2020 Elsevier Patient Education  2023 Elsevier Inc.  

## 2022-04-16 NOTE — Progress Notes (Signed)
$'@Patient'l$  ID: Julie Wilkerson, female    DOB: May 30, 1940, 81 y.o.   MRN: [id number]  Chief Complaint  Patient presents with   Consult    Pt has CPAP at home from Dr. Claiborne Billings.  Pt saw Dr. Erin Fulling and was referred for possible OSA.  CPAP mask not fitting properly.    Referring provider: [name], MD  HPI: 81 year old female, never smoked.  Past medical history significant for hypertension, chronic congestive heart failure, coronary artery disease, atrial tachycardia, asthma, sleep apnea.  04/16/2022 Patient presents today for sleep consult. She follows with Dr. Erin Fulling with [facility] for chronic cough/asthma. She was referred by him during her last office visit d/t history of sleep apnea, she is having issues tolerating CPAP and issues with mask fit. Dr. Claiborne Billings with cardiology was managing her sleep apnea. Home sleep study in November 2021 showed moderate obstructive sleep apnea, average AHI 18/hour. CPAP machine not working well, mask reportedly coming off. It took a long time for her to be able to tolerate her CPAP machine. Her daughter states that most masks were too big. She is currently using Airfit [email or web address] nasal mask size small, which was working well for her until recently. She reports sinus discomfort with use. She is not getting up any purulent mucus. She uses flonase 1-2 times a day. She is using humidification with CPAP.  Dme is [facility].    Airview download 04/16/21-04/15/22 Days used greater than 4 hours - 291/355 (81%) Average usage - 5 hours 58 minutes Leak 95th percentile- 27 L/min Average AHI 3.3 events per hour  Airview download 03/17/22- 04/15/22 Days greater than 4 hours - 8/20 (40%)  Average usage 3 hours 9 minutes Leaks 95% - 30.98 L/min Average AHI - 2.23 events per hour   Sleep questionnaire Symptoms- CPAP machine not working     Prior sleep study-Home sleep study 2021 Bedtime- 9:30pm Time to fall asleep- 10-15 mins Nocturnal awakenings- 2-3  times  Out of bed/start of day- 8am Weight changes- n/a Do you operate heavy machinery- no Do you currently wear CPAP- yes Do you current wear oxygen- no Epworth- 9    Allergies  Allergen Reactions   Valium [Diazepam] Shortness Of Breath    Sob and bradycardia   Albuterol And Levalbuterol Palpitations    Episode of palpitations, tremor and anxiety when administered levalbuterol for PFT. Recommend avoiding this medicaiton class    Immunization History  Administered Date(s) Administered   Fluad Quad(high Dose 65+) 02/01/2019, 05/29/2020, 03/17/2021, 03/25/2022   Influenza, High Dose Seasonal PF 03/11/2015, 02/05/2016, 02/05/2017, 03/23/2018   Influenza,inj,quad, With Preservative 05/23/2014, 03/24/2018   PFIZER(Purple Top)SARS-COV-2 Vaccination 06/13/2019, 07/04/2019, 04/22/2020   Pneumococcal Conjugate-13 02/01/2019   Pneumococcal Polysaccharide-23 05/23/2014   Td 02/05/2016   Zoster, Live 05/24/2009    Past Medical History:  Diagnosis Date   Abdominal pain 11/07/2016   Accelerating angina ([place]) 61/95/0932   Acute diastolic CHF (congestive heart failure) ([place]) 02/16/2019   Acute on chronic diastolic CHF (congestive heart failure) ([place]) 02/23/2019   Acute pansinusitis 04/26/2017   Amblyopia of eye, left 04/19/2018   Anemia    Anginal pain ([facility])    Anxiety    Arthritis    Asthma    Atypical chest pain 02/04/2019   Bilateral carotid bruits 03/28/2017   Blood transfusion without reported diagnosis    Breast cancer ([place])    2002, left, encapsulated, microcalcifications. lumpectomy, radtiation x 30   Breast cancer in female [facility])  Bursitis of left hip 09/04/2018   Change in bowel habits 02/22/2018   Change in mole 06/08/2016   Chest pain 03/04/2020   CHF (congestive heart failure) (H[facility])    Chronic bilateral thoracic back pain 05/23/2016   Chronic diastolic congestive heart failure ([place]) 05/28/2019   Colitis    Complication of anesthesia    VERY SENSITIVE TO  ANESTHESIA    Constipation 01/17/2018   Coronary artery disease involving native coronary artery of native heart without angina pectoris 05/28/2019   Cortical age-related cataract of right eye 04/18/2018   Cough variant asthma  vs UACS from ACEi     Spirometry 12/02/2016  FEV1 1.26 (76%)  Ratio 78 with min curvature p last saba > 6 h prior  - trial off acei 12/02/2016  - 12/02/2016  After extensive coaching HFA effectiveness =    75% with qvar autohaler > rechallenge with 80 2bid     Decreased visual acuity 01/16/2017   Degeneration of lumbar intervertebral disc 08/01/2018   Diarrhea 11/16/2015   D/o Diverticuli, polyps, celiac disease.     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   Left hand pain 05/07/2018   Left hip pain 03/01/2017   Left leg pain 03/01/2017   Left shoulder pain 05/07/2018   Low back pain 08/10/2015   LUQ pain 06/01/2019   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   Neck pain 03/01/2017   Nuclear sclerotic cataract of right eye 04/18/2018   Osteoporosis    Overweight (BMI 25.0-29.9) 02/14/2019   Pain in joint of left shoulder 08/11/2018   Paroxysmal atrial tachycardia 05/28/2019   Pelvic pain 07/02/2018   Personal history of radiation therapy    Pneumonia    PONV (postoperative nausea and vomiting)    Poor appetite 02/22/2018   Pseudophakia of left eye 04/18/2018   PVD (posterior  vitreous detachment), right 04/24/2019   Restless sleeper 11/07/2016   Right hip pain 10/23/2017   RLQ discomfort 03/01/2017   RLS (restless legs syndrome) 05/07/2019   S/P left THA, AA 02/13/2019   Sepsis due to pneumonia ([place]) 02/16/2019   Sleep apnea    uses CPAP   Stomach cramps    Tremor 02/15/2016   Urinary frequency 12/22/2017   Urinary incontinence 05/07/2019   Urinary tract infection 11/04/2016   Vitamin D deficiency 04/29/2016    Tobacco History: Social History   Tobacco Use  Smoking Status Never  Smokeless Tobacco Never   Counseling given: Not Answered   Outpatient Medications Prior to Visit  Medication Sig Dispense Refill   ASPIRIN 81 PO 81 mg daily.      citalopram (CELEXA) 20 MG tablet Take 1 tablet (20 mg total) by mouth at bedtime. 90 tablet 0   fexofenadine (ALLEGRA ALLERGY) 180 MG tablet Take 1 tablet (180 mg total) by mouth daily as needed for allergies or rhinitis.  fluticasone (FLOVENT HFA) 110 MCG/ACT inhaler Inhale 1-2 puffs into the lungs 2 (two) times daily as needed. 1 each 12   furosemide (LASIX) 40 MG tablet Take 1 tablet (40 mg total) by mouth daily. 90 tablet 1   hyoscyamine (LEVSIN SL) 0.125 MG SL tablet Place 1 tablet (0.125 mg total) under the tongue 2 (two) times daily. 30 tablet 1   ipratropium (ATROVENT) 0.02 % nebulizer solution Take 2.5 mLs (0.5 mg total) by nebulization every 6 (six) hours as needed for wheezing or shortness of breath. 75 mL 12   levothyroxine (SYNTHROID) 50 MCG tablet TAKE 1 TABLET BY MOUTH ONCE DAILY BEFORE BREAKFAST 90 tablet 0   lisinopril (ZESTRIL) 10 MG tablet Take 1 tablet (10 mg total) by mouth 2 (two) times daily as needed. 180 tablet 0   metoprolol succinate (TOPROL-XL) 25 MG 24 hr tablet Take 1 tablet (25 mg total) by mouth at bedtime. 90 tablet 0   NON FORMULARY as needed (wheezing, SOB). Iprasynt inhaler (unable to find english equivalent med name)     omega-3 acid ethyl esters (LOVAZA) 1 g capsule Take 2  g by mouth daily.     omeprazole (PRILOSEC) 40 MG capsule Take 1 capsule (40 mg total) by mouth in the morning and at bedtime. Take 1 tablet by mouth once to twice daily 60 capsule 5   ondansetron (ZOFRAN) 4 MG tablet Dissolve 1 tablet under tongue once to twice a day, every day 90 tablet 3   potassium chloride (KLOR-CON M) 10 MEQ tablet Take 1 tablet (10 mEq total) by mouth daily. 90 tablet 1   sucralfate (CARAFATE) 1 g tablet Take 1 tablet by mouth twice daily 60 tablet 0   No facility-administered medications prior to visit.   Review of Systems  Review of Systems  Constitutional:  Positive for fatigue.  HENT:  Positive for sinus pain.   Respiratory: Negative.    Cardiovascular: Negative.    Physical Exam  BP 124/64 (BP Location: Right Arm, Patient Position: Sitting, Cuff Size: Normal)   Pulse 74   Temp (!) 97.4 F (36.3 C) (Oral)   Ht '4\' 10"'$  (1.473 m)   Wt 115 lb 6.4 oz (52.3 kg)   SpO2 97%   BMI 24.12 kg/m  Physical Exam Constitutional:      Appearance: Normal appearance.  HENT:     Head: Normocephalic and atraumatic.     Nose: No nasal deformity.     Right Sinus: Maxillary sinus tenderness present.     Left Sinus: Maxillary sinus tenderness present.     Mouth/Throat:     Mouth: Mucous membranes are moist.     Pharynx: Oropharynx is clear. No oropharyngeal exudate or posterior oropharyngeal erythema.  Cardiovascular:     Rate and Rhythm: Normal rate and regular rhythm.  Pulmonary:     Effort: Pulmonary effort is normal.     Breath sounds: Normal breath sounds.  Musculoskeletal:        General: Normal range of motion.     Cervical back: Normal range of motion and neck supple.  Skin:    General: Skin is warm and dry.  Neurological:     General: No focal deficit present.     Mental Status: She is alert and oriented to person, place, and time. Mental status is at baseline.  Psychiatric:        Mood and Affect: Mood normal.        Behavior: Behavior normal.  Thought Content: Thought content normal.        Judgment: Judgment normal.      Lab Results:  CBC    Component Value Date/Time   WBC 5.1 03/25/2022 1234   RBC 3.90 03/25/2022 1234   HGB 11.2 (L) 03/25/2022 1234   HGB 11.0 (L) 02/05/2019 1058   HCT 34.1 (L) 03/25/2022 1234   HCT 33.7 (L) 02/05/2019 1058   PLT 206.0 03/25/2022 1234   PLT 227 02/05/2019 1058   MCV 87.4 03/25/2022 1234   MCV 89 02/05/2019 1058   MCH 30.0 11/02/2021 1540   MCHC 32.9 03/25/2022 1234   RDW 15.2 03/25/2022 1234   RDW 14.0 02/05/2019 1058   LYMPHSABS 1.5 02/04/2022 1001   MONOABS 0.6 02/04/2022 1001   EOSABS 0.0 02/04/2022 1001   BASOSABS 0.0 02/04/2022 1001    BMET    Component Value Date/Time   NA 140 03/25/2022 1234   NA 136 (A) 10/04/2019 0000   K 4.6 03/25/2022 1234   CL 101 03/25/2022 1234   CO2 29 03/25/2022 1234   GLUCOSE 86 03/25/2022 1234   BUN 29 (H) 03/25/2022 1234   BUN 28 (A) 10/04/2019 0000   CREATININE 1.27 (H) 03/25/2022 1234   CREATININE 1.39 (H) 09/04/2021 1558   CALCIUM 9.2 03/25/2022 1234   GFRNONAA 39 (L) 11/02/2021 1540   GFRAA 57 (L) 04/03/2019 1111    BNP    Component Value Date/Time   BNP 190.5 (H) 12/23/2020 1118    ProBNP    Component Value Date/Time   PROBNP 159.0 (H) 06/23/2021 1130    Imaging: CT HEAD WO CONTRAST (5MM)  Result Date: 04/09/2022 CLINICAL DATA:  Headache EXAM: CT HEAD WITHOUT CONTRAST TECHNIQUE: Contiguous axial images were obtained from the base of the skull through the vertex without intravenous contrast. RADIATION DOSE REDUCTION: This exam was performed according to the departmental dose-optimization program which includes automated exposure control, adjustment of the mA and/or kV according to patient size and/or use of iterative reconstruction technique. COMPARISON:  CT brain 03/04/2020 FINDINGS: Brain: No acute territorial infarction, hemorrhage or intracranial mass. Mild atrophy. Patchy white matter hypodensity consistent with  chronic small vessel ischemic change. More focal hypodensity within the left sub insula, may reflect small vessel disease or chronic small infarct, this is unchanged. The ventricles are nonenlarged Vascular: No hyperdense vessels.  Carotid vascular calcification Skull: Normal. Negative for fracture or focal lesion. Sinuses/Orbits: No acute finding. Other: None IMPRESSION: No CT evidence for acute intracranial abnormality. Atrophy and chronic small vessel ischemic changes of the white matter. Electronically Signed   By: Donavan Foil M.D.   On: 04/09/2022 15:17     Assessment & Plan:   Sleep apnea - Dx with moderate OSA in November 2021, AHI 18/hour and has been on CPAP since then. Currently having issues tolerating CPAP and difficulty with mask fit. She is 51% compliant with CPAP use >4 hours over the last 90 days. Average usage 3 hours 25 mins. She is using Airfit [email or web address] nasal mask size small which was working well until recently but now causing sinus discomfort. Apneas are well controlled on current pressure settings 8-14cm h20 with residual AHI 2.8/hr. She is having moderate amount of air leaks. She is due for new CPAP supplies. Plan for patient to have mask desensitization study in lab and renew CPAP supplies with [facility]. We may be able to lower max pressure if changing mask does not help.   Sinusitis - She has been having difficulty  tolerating CPAP d.t sinus discomfort. No signs of active bacterial sinus infection. Recommend patient try decreasing flonase use as it could be irritating her nasal passages, recommend adding ocean saline nasal spray 1-2 times a day and ARY nasal gel. No indications for abx at this time. Could consider prednisone course for sinusitis symptoms before referral to ENT    Martyn Ehrich, NP 04/16/2022

## 2022-04-16 NOTE — Assessment & Plan Note (Addendum)
-   Dx with moderate OSA in November 2021, AHI 18/hour and has been on CPAP since then. Currently having issues tolerating CPAP and difficulty with mask fit. She is 51% compliant with CPAP use >4 hours over the last 90 days. Average usage 3 hours 25 mins. She is using Airfit [email or web address] nasal mask size small which was working well until recently but now causing sinus discomfort. Apneas are well controlled on current pressure settings 8-14cm h20 with residual AHI 2.8/hr. She is having moderate amount of air leaks. She is due for new CPAP supplies. Plan for patient to have mask desensitization study in lab and renew CPAP supplies with [facility]. We may be able to lower max pressure if changing mask does not help.

## 2022-04-19 NOTE — Telephone Encounter (Signed)
Mychart message sent by pt: Consuella Lose [facility] ([place], NP)3 days ago    Hello, The address where I received my CPAP machine supplies is [address] in [place]. They no longer provide CPAP supplies there.  Janiaya     Pt's prior DME was [facility] and they are now only providing services to hospice patients. Beth, please advise if you want [name] to place an order for pt to be set up with a new DME or if you want this to wait until after she has the titration study so we can get all taken care of at the same time.

## 2022-04-20 NOTE — Telephone Encounter (Signed)
We can place an order for CPAP supplies with new DME.

## 2022-04-27 ENCOUNTER — Telehealth: Payer: Self-pay

## 2022-04-27 NOTE — Patient Outreach (Signed)
  Care Coordination   Initial Visit Note   04/27/2022 Name: Julie Wilkerson MRN: [id number] DOB: 10/02/1940  Julie Wilkerson is a 81 y.o. year old female who sees [name], MD for primary care. I spoke with  [name] by phone today.  What matters to the patients health and wellness today?  Ms. Loiseau reports this is not a good time to talk and request to schedule a time to discuss care coordination program.   SDOH assessments and interventions completed:  No  Care Coordination Interventions:  No, not indicated   Follow up plan: Follow up call scheduled for 04/28/22    Encounter Outcome:  Pt. Scheduled   Thea Silversmith, RN, MSN, BSN, [place] Coordinator [phone]

## 2022-04-28 ENCOUNTER — Encounter: Payer: Self-pay | Admitting: Urology

## 2022-04-28 ENCOUNTER — Ambulatory Visit: Payer: Self-pay

## 2022-04-28 ENCOUNTER — Ambulatory Visit (INDEPENDENT_AMBULATORY_CARE_PROVIDER_SITE_OTHER): Payer: Medicare HMO | Admitting: Urology

## 2022-04-28 VITALS — BP 112/71 | HR 69 | Ht <= 58 in | Wt 113.0 lb

## 2022-04-28 DIAGNOSIS — N3941 Urge incontinence: Secondary | ICD-10-CM

## 2022-04-28 DIAGNOSIS — N952 Postmenopausal atrophic vaginitis: Secondary | ICD-10-CM

## 2022-04-28 DIAGNOSIS — R1030 Lower abdominal pain, unspecified: Secondary | ICD-10-CM

## 2022-04-28 DIAGNOSIS — Z87440 Personal history of urinary (tract) infections: Secondary | ICD-10-CM

## 2022-04-28 DIAGNOSIS — R3915 Urgency of urination: Secondary | ICD-10-CM

## 2022-04-28 LAB — BLADDER SCAN AMB NON-IMAGING: Scan Result: 79

## 2022-04-28 MED ORDER — MIRABEGRON ER 25 MG PO TB24: 25.0000 mg | ORAL_TABLET | Freq: Every day | ORAL | 0 refills | Status: DC

## 2022-04-28 NOTE — Progress Notes (Signed)
post void residual =79mL 

## 2022-04-28 NOTE — Progress Notes (Signed)
Assessment: 1. Urinary urgency   2. Urge incontinence   3. Lower abdominal pain   4. History of UTI   5. Post-menopausal atrophic vaginitis     Plan: I reviewed the patient's chart including office notes, lab results, and imaging results. Diagnosis and management of OAB and urge incontinence discussed with the patient including behavioral modification, avoidance of dietary irritants, medical therapy, neuromodulation, and chemodenervation. Bladder diet sheet provided Trial of Myrbetriq 25 mg daily.  Samples given.   Return to office in 1 month.  Chief Complaint:  Chief Complaint  Patient presents with   Urinary Frequency    History of Present Illness:  Julie Wilkerson is a 81 y.o. female who is seen in consultation from [name], MD for evaluation of urinary urgency and urge incontinence.  She has had urinary symptoms for a number of years.  She reports urinary frequency, urgency, nocturia, and urge incontinence.  She is using incontinence pads, 1-2 many pads/day.  No dysuria or gross hematuria.  She has not tried any medical therapy for her symptoms. She reports a history of frequent UTIs.  Her last UTI was in July 2023.  Urine culture at that time grew 10-49 K Enterococcus. She reports fairly constant lower abdominal pain.  She reports that the pain is worsened with the need to void.  She has previously been evaluated by GI.  She does have ongoing problems with diarrhea. CT imaging from January 2023 showed no renal or ureteral calculi, no evidence of obstruction, and an unremarkable bladder. She was previously evaluated by urogynecology in [facility].  She was seen in June 2023 for urinary incontinence and pelvic pain.  Bladder scan showed a volume of 65 mL.  Pelvic exam demonstrated no urethral hypermobility, tender bladder, atrophic changes, hypertonic pelvic floor, and no prolapse.  She was started on vaginal hormone cream.  She was also given a prescription for vaginal  Valium suppositories and pelvic floor physical therapy was recommended.  She did not tolerate the vaginal Valium and did not proceed with physical therapy.   Past Medical History:  Past Medical History:  Diagnosis Date   Abdominal pain 11/07/2016   Accelerating angina ([place]) 51/76/1607   Acute diastolic CHF (congestive heart failure) ([place]) 02/16/2019   Acute on chronic diastolic CHF (congestive heart failure) ([place]) 02/23/2019   Acute pansinusitis 04/26/2017   Amblyopia of eye, left 04/19/2018   Anemia    Anginal pain (HCC)    Anxiety    Arthritis    Asthma    Atypical chest pain 02/04/2019   Bilateral carotid bruits 03/28/2017   Blood transfusion without reported diagnosis    Breast cancer ([place])    2002, left, encapsulated, microcalcifications. lumpectomy, radtiation x 30   Breast cancer in female [facility])    Bursitis of left hip 09/04/2018   Change in bowel habits 02/22/2018   Change in mole 06/08/2016   Chest pain 03/04/2020   CHF (congestive heart failure) (HCC)    Chronic bilateral thoracic back pain 05/23/2016   Chronic diastolic congestive heart failure ([place]) 05/28/2019   Colitis    Complication of anesthesia    VERY SENSITIVE TO ANESTHESIA    Constipation 01/17/2018   Coronary artery disease involving native coronary artery of native heart without angina pectoris 05/28/2019   Cortical age-related cataract of right eye 04/18/2018   Cough variant asthma  vs UACS from ACEi     Spirometry 12/02/2016  FEV1 1.26 (76%)  Ratio 78 with min  curvature p last saba > 6 h prior  - trial off acei 12/02/2016  - 12/02/2016  After extensive coaching HFA effectiveness =    75% with qvar autohaler > rechallenge with 80 2bid     Decreased visual acuity 01/16/2017   Degeneration of lumbar intervertebral disc 08/01/2018   Diarrhea 11/16/2015   D/o Diverticuli, polyps, celiac disease.     Dyspnea 11/04/2016   with asthma exacerbation only   Dysuria 11/04/2016   Elevated sed rate 10/23/2017    Essential hypertension    Trial off acei 12/02/2016 due to cough/ pseudoasthma   GERD (gastroesophageal reflux disease)    Gluten intolerance    History of chicken pox    History of Helicobacter pylori infection 08/01/2015   History of rectal bleeding 06/01/2019   Hx of LASIK 04/24/2019   Hyperglycemia 11/07/2016   Hyperlipidemia    Hypertension    Hyponatremia 02/16/2019   Hypothyroidism    IBS (irritable bowel syndrome)    Lattice degeneration of right retina 04/24/2019   Left hand pain 05/07/2018   Left hip pain 03/01/2017   Left leg pain 03/01/2017   Left shoulder pain 05/07/2018   Low back pain 08/10/2015   LUQ pain 06/01/2019   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   Neck pain 03/01/2017   Nuclear sclerotic cataract of right eye 04/18/2018   Osteoporosis    Overweight (BMI 25.0-29.9) 02/14/2019   Pain in joint of left shoulder 08/11/2018   Paroxysmal atrial tachycardia 05/28/2019   Pelvic pain 07/02/2018   Personal history of radiation therapy    Pneumonia    PONV (postoperative nausea and vomiting)    Poor appetite 02/22/2018   Pseudophakia of left eye 04/18/2018   PVD (posterior vitreous detachment), right 04/24/2019   Restless sleeper 11/07/2016   Right hip pain 10/23/2017   RLQ discomfort 03/01/2017   RLS (restless legs syndrome) 05/07/2019   S/P left THA, AA 02/13/2019   Sepsis due to pneumonia ([place]) 02/16/2019   Sleep apnea    uses CPAP   Stomach cramps    Tremor 02/15/2016   Urinary frequency 12/22/2017   Urinary incontinence 05/07/2019   Urinary tract infection 11/04/2016   Vitamin D deficiency 04/29/2016    Past Surgical History:  Past Surgical History:  Procedure Laterality Date   ABDOMINAL HYSTERECTOMY     partial   APPENDECTOMY     BREAST LUMPECTOMY Left 2002   CHOLECYSTECTOMY  Age 77 or 26   COLONOSCOPY  2017   EYE SURGERY Left    cataract   LEFT HEART CATH AND CORONARY ANGIOGRAPHY N/A  02/09/2019   Procedure: LEFT HEART CATH AND CORONARY ANGIOGRAPHY;  Surgeon: Nelva Bush, MD;  Location: [facility];  Service: Cardiovascular;  Laterality: N/A;   TONSILLECTOMY     TOTAL HIP ARTHROPLASTY Left 02/13/2019   Procedure: TOTAL HIP ARTHROPLASTY ANTERIOR APPROACH;  Surgeon: Paralee Cancel, MD;  Location: [facility];  Service: Orthopedics;  Laterality: Left;  70 mins   UPPER GASTROINTESTINAL ENDOSCOPY      Allergies:  Allergies  Allergen Reactions   Valium [Diazepam] Shortness Of Breath    Sob and bradycardia   Albuterol And Levalbuterol Palpitations    Episode of palpitations, tremor and anxiety when administered levalbuterol for PFT. Recommend avoiding this medicaiton class    Family History:  Family History  Problem Relation Age of Onset   Colitis Mother    Irritable bowel syndrome Mother    Hypertension Father  Hyperlipidemia Sister    Hyperlipidemia Brother    Heart attack Brother    Hyperlipidemia Brother    Heart attack Brother    Hyperlipidemia Brother    Heart attack Brother    Stomach cancer Paternal Grandmother 85   Colon cancer Paternal Grandmother    Leukemia Daughter    Arthritis Daughter    Heart disease Daughter        ASD vs VSD   Esophageal cancer Neg Hx    Pancreatic cancer Neg Hx    Rectal cancer Neg Hx     Social History:  Social History   Tobacco Use   Smoking status: Never   Smokeless tobacco: Never  Vaping Use   Vaping Use: Never used  Substance Use Topics   Alcohol use: Never    Alcohol/week: 0.0 standard drinks of alcohol   Drug use: Never    Review of symptoms:  Constitutional:  Negative for unexplained weight loss, night sweats, fever, chills ENT:  Negative for nose bleeds, sinus pain, painful swallowing CV:  Negative for chest pain, shortness of breath, exercise intolerance, palpitations, loss of consciousness Resp:  Negative for cough, wheezing, shortness of breath GI:  Negative for nausea, vomiting, diarrhea,  bloody stools GU:  Positives noted in HPI; otherwise negative for gross hematuria, dysuria Neuro:  Negative for seizures, poor balance, limb weakness, slurred speech Psych:  Negative for lack of energy, depression, anxiety Endocrine:  Negative for polydipsia, polyuria, symptoms of hypoglycemia (dizziness, hunger, sweating) Hematologic:  Negative for anemia, purpura, petechia, prolonged or excessive bleeding, use of anticoagulants  Allergic:  Negative for difficulty breathing or choking as a result of exposure to anything; no shellfish allergy; no allergic response (rash/itch) to materials, foods  Physical exam: BP 112/71   Pulse 69   Ht '4\' 10"'$  (1.473 m)   Wt 113 lb (51.3 kg)   BMI 23.62 kg/m  GENERAL APPEARANCE:  Well appearing, well developed, well nourished, NAD HEENT: Atraumatic, Normocephalic, oropharynx clear. NECK: Supple without lymphadenopathy or thyromegaly. LUNGS: Clear to auscultation bilaterally. HEART: Regular Rate and Rhythm without murmurs, gallops, or rubs. ABDOMEN: Soft, non-tender, No Masses. EXTREMITIES: Moves all extremities well.  Without clubbing, cyanosis, or edema. NEUROLOGIC:  Alert and oriented x 3, normal gait, CN II-XII grossly intact.  MENTAL STATUS:  Appropriate. BACK:  Non-tender to palpation.  No CVAT SKIN:  Warm, dry and intact.    Results: U/A: dipstick trace blood  PVR = 79 ml

## 2022-04-28 NOTE — Patient Outreach (Signed)
  Care Coordination   Initial Visit Note   04/28/2022 Name: Julie Wilkerson MRN: [id number] DOB: Oct 15, 1940  Julie Wilkerson is a 81 y.o. year old female who sees Julie Lukes, MD for primary care. I spoke with  Julie Wilkerson by phone today.  What matters to the patients health and wellness today?  Julie Wilkerson denies any care coordination, disease management or resource needs at this time. Julie Wilkerson to contact RNCM if needs change.    Goals Addressed             This Visit's Progress    COMPLETED: Care Coordination Activites       Care Coordination Interventions: Discussed care coordination services Encouraged to contact RNCM(contact number provided) and/or primary care provider if needs change in the future        SDOH assessments and interventions completed:  Yes  SDOH Interventions Today    Flowsheet Row Most Recent Value  SDOH Interventions   Food Insecurity Interventions Intervention Not Indicated  Housing Interventions Intervention Not Indicated  Transportation Interventions Intervention Not Indicated  Utilities Interventions Intervention Not Indicated     Care Coordination Interventions:  Yes, provided   Follow up plan: No further intervention required.   Encounter Outcome:  Pt. Visit Completed  Thea Silversmith, RN, MSN, BSN, [place] Coordinator [phone]

## 2022-04-29 LAB — URINALYSIS
Bilirubin, UA: NEGATIVE
Glucose, UA: NEGATIVE mg/dL
Ketones, UA: NEGATIVE
Leukocytes, UA: NEGATIVE
Nitrite, UA: NEGATIVE
Protein, UA: NEGATIVE
Spec Grav, UA: 1.02 (ref 1.010–1.025)
Urobilinogen, UA: NORMAL
pH, UA: 6 (ref 5.0–8.0)

## 2022-04-30 ENCOUNTER — Encounter: Payer: Self-pay | Admitting: Obstetrics and Gynecology

## 2022-04-30 ENCOUNTER — Ambulatory Visit (INDEPENDENT_AMBULATORY_CARE_PROVIDER_SITE_OTHER): Payer: Medicare HMO | Admitting: Obstetrics and Gynecology

## 2022-04-30 VITALS — BP 131/80 | HR 66 | Ht <= 58 in | Wt 114.0 lb

## 2022-04-30 DIAGNOSIS — R350 Frequency of micturition: Secondary | ICD-10-CM

## 2022-04-30 DIAGNOSIS — N393 Stress incontinence (female) (male): Secondary | ICD-10-CM | POA: Diagnosis not present

## 2022-04-30 DIAGNOSIS — M62838 Other muscle spasm: Secondary | ICD-10-CM | POA: Diagnosis not present

## 2022-04-30 DIAGNOSIS — R159 Full incontinence of feces: Secondary | ICD-10-CM

## 2022-04-30 DIAGNOSIS — N3281 Overactive bladder: Secondary | ICD-10-CM

## 2022-04-30 LAB — POCT URINALYSIS DIPSTICK
Bilirubin, UA: NEGATIVE
Blood, UA: NEGATIVE
Glucose, UA: NEGATIVE
Ketones, UA: NEGATIVE
Leukocytes, UA: NEGATIVE
Nitrite, UA: NEGATIVE
Protein, UA: NEGATIVE
Spec Grav, UA: 1.01 (ref 1.010–1.025)
Urobilinogen, UA: 0.2 E.U./dL
pH, UA: 5.5 (ref 5.0–8.0)

## 2022-04-30 NOTE — Progress Notes (Signed)
Julie Wilkerson Urogynecology New Patient Evaluation and Consultation  Referring Provider: [name], MD PCP: [name], MD Date of Service: 04/30/2022  SUBJECTIVE Chief Complaint: New Patient (Initial Visit) Comp Julie Wilkerson is a 81 y.o. female here for a consult for low pelvic pain that feels like menstrual pain. Pt complains of urinary and fecal incontinence./)  History of Present Illness: Julie Wilkerson is a 81 y.o. hispanic female seen in consultation at the request of Dr. Charlett Blake for evaluation of urinary urgency.    Review of records significant for: Has complaints of urinary incontinence, urgency and painful burning. Has been seen by [facility] (Dr Aundra Dubin). She was diagnosed with high tone pelvic floor dysfunction and physical therapy and vaginal valium was recommended. Also prescribed vaginal estrogen cream.   Also seen by [facility]rology [facility]- Dr Felipa Eth.   Urinary Symptoms: Leaks urine with cough/ sneeze, lifting, and with urgency. UUI > SUI. Leakage with SUI is not often.  Leaks 2-3 time(s) per day.  Pad use: 1-2 pads per day.   She is bothered by her UI symptoms. Just started on Myrbetriq two days ago- prescribed by Dr Felipa Eth.   Day time voids 4-5.  Nocturia: 2-3 times per night to void. Voiding dysfunction: she does not empty her bladder well.  does not use a catheter to empty bladder.  When urinating, she feels a weak stream, dribbling after finishing, and the need to urinate multiple times in a row Drinks: tiny amount of coffee in milk (teaspoon for flavor), about 3 bottles water, 2 cups tea with coconut milk (herbal, sometimes has caffeine) per day  UTIs: 1-2 UTI's in the last year.   Denies history of blood in urine and kidney or bladder stones  Pelvic Organ Prolapse Symptoms:                  She Denies a feeling of a bulge the vaginal area.   Bowel Symptom: Bowel movements: 2-3 time(s) per day, sometimes less if stool is not loose Stool  consistency: loose Straining: no.  Splinting: no.  Incomplete evacuation: yes.  She Admits to accidental bowel leakage / fecal incontinence  Occurs: a few times per week  Consistency with leakage: soft  Bowel regimen: diet, has tried fiber but not consistent with it Last colonoscopy: 2 years ago  Sexual Function Sexually active: no.   Pelvic Pain Admits to pelvic pain- this is what is bothering her the most.  Location: lower abdomen Improved by: pain killers  She tried a valium suppository but it made her feel sick and she ended up in Emergency room.   Has a lot of back pain, has been worsening. Sees an orthopedist who recommended surgery. Sometimes gets blocks which help short term.   Has CT A/P on 06/10/21- no adnexal masses noted.   Past Medical History:  Past Medical History:  Diagnosis Date   Abdominal pain 11/07/2016   Accelerating angina ([place]) 64/33/2951   Acute diastolic CHF (congestive heart failure) ([place]) 02/16/2019   Acute on chronic diastolic CHF (congestive heart failure) ([place]) 02/23/2019   Acute pansinusitis 04/26/2017   Amblyopia of eye, left 04/19/2018   Anemia    Anginal pain (HCC)    Anxiety    Arthritis    Asthma    Atypical chest pain 02/04/2019   Bilateral carotid bruits 03/28/2017   Blood transfusion without reported diagnosis    Breast cancer ([place])    2002, left, encapsulated, microcalcifications. lumpectomy, radtiation x  51   Breast cancer in female [facility])    Bursitis of left hip 09/04/2018   Change in bowel habits 02/22/2018   Change in mole 06/08/2016   Chest pain 03/04/2020   CHF (congestive heart failure) (HCC)    Chronic bilateral thoracic back pain 05/23/2016   Chronic diastolic congestive heart failure ([place]) 05/28/2019   Colitis    Complication of anesthesia    VERY SENSITIVE TO ANESTHESIA    Constipation 01/17/2018   Coronary artery disease involving native coronary artery of native heart without angina pectoris 05/28/2019    Cortical age-related cataract of right eye 04/18/2018   Cough variant asthma  vs UACS from ACEi     Spirometry 12/02/2016  FEV1 1.26 (76%)  Ratio 78 with min curvature p last saba > 6 h prior  - trial off acei 12/02/2016  - 12/02/2016  After extensive coaching HFA effectiveness =    75% with qvar autohaler > rechallenge with 81 2bid     Decreased visual acuity 01/16/2017   Degeneration of lumbar intervertebral disc 08/01/2018   Diarrhea 11/16/2015   D/o Diverticuli, polyps, celiac disease.     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   Left hand pain 05/07/2018   Left hip pain 03/01/2017   Left leg pain 03/01/2017   Left shoulder pain 05/07/2018   Low back pain 08/10/2015   LUQ pain 06/01/2019   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   Neck pain 03/01/2017   Nuclear sclerotic cataract of right eye 04/18/2018   Osteoporosis    Overweight (BMI 25.0-29.9) 02/14/2019   Pain in joint of left shoulder 08/11/2018   Paroxysmal atrial tachycardia 05/28/2019   Pelvic pain 07/02/2018   Personal history of radiation therapy    Pneumonia    PONV (postoperative nausea and vomiting)    Poor appetite 02/22/2018   Pseudophakia of left eye 04/18/2018   PVD (posterior vitreous detachment), right 04/24/2019   Restless sleeper 11/07/2016   Right hip pain 10/23/2017   RLQ discomfort 03/01/2017   RLS (restless legs  syndrome) 05/07/2019   S/P left THA, AA 02/13/2019   Sepsis due to pneumonia ([place]) 02/16/2019   Sleep apnea    uses CPAP   Stomach cramps    Tremor 02/15/2016   Urinary frequency 12/22/2017   Urinary incontinence 05/07/2019   Urinary tract infection 11/04/2016   Vitamin D deficiency 04/29/2016     Past Surgical History:   Past Surgical History:  Procedure Laterality Date   ABDOMINAL HYSTERECTOMY     partial   APPENDECTOMY     BREAST LUMPECTOMY Left 2002   CHOLECYSTECTOMY  Age 68 or 30   COLONOSCOPY  2017   EYE SURGERY Left    cataract   LEFT HEART CATH AND CORONARY ANGIOGRAPHY N/A 02/09/2019   Procedure: LEFT HEART CATH AND CORONARY ANGIOGRAPHY;  Surgeon: Nelva Bush, MD;  Location:  [facility];  Service: Cardiovascular;  Laterality: N/A;   TONSILLECTOMY     TOTAL HIP ARTHROPLASTY Left 02/13/2019   Procedure: TOTAL HIP ARTHROPLASTY ANTERIOR APPROACH;  Surgeon: Paralee Cancel, MD;  Location: [facility];  Service: Orthopedics;  Laterality: Left;  70 mins   UPPER GASTROINTESTINAL ENDOSCOPY       Past OB/GYN History: OB History  Gravida Para Term Preterm AB Living  '4 3 3   1 3  '$ SAB IAB Ectopic Multiple Live Births  1       3    # Outcome Date GA Lbr Len/2nd Weight Sex Delivery Anes PTL Lv  4 Term 1979 [redacted]w[redacted]d  F Vag-Spont Local N LIV  3 Term 1977 [id number] F Vag-Spont Local N LIV  2 Term 1974 [id number] Vag-Spont Local N LIV  1 SAB 1972           Forceps used for first delivery Menopausal: Denies vaginal bleeding since menopause   Medications: She has a current medication list which includes the following prescription(s): aspirin, citalopram, fexofenadine, fluticasone, furosemide, hyoscyamine, ipratropium, levothyroxine, lisinopril, metoprolol succinate, mirabegron er, NON FORMULARY, omega-3 acid ethyl esters, omeprazole, ondansetron, potassium chloride, and sucralfate.   Allergies: Patient is allergic to valium [diazepam] and albuterol and levalbuterol.   Social  History:  Social History   Tobacco Use   Smoking status: Never   Smokeless tobacco: Never  Vaping Use   Vaping Use: Never used  Substance Use Topics   Alcohol use: Never    Alcohol/week: 0.0 standard drinks of alcohol   Drug use: Never    Relationship status: single She is not employed . Regular exercise: Yes:     Family History:   Family History  Problem Relation Age of Onset   Colitis Mother    Irritable bowel syndrome Mother    Hypertension Father    Hyperlipidemia Sister    Hyperlipidemia Brother    Heart attack Brother    Hyperlipidemia Brother    Heart attack Brother    Hyperlipidemia Brother    Heart attack Brother    Stomach cancer Paternal Grandmother 65 19Colon cancer Paternal Grandmother    Leukemia Daughter    Arthritis Daughter    Heart disease Daughter        ASD vs VSD   Esophageal cancer Neg Hx    Pancreatic cancer Neg Hx    Rectal cancer Neg Hx      Review of Systems: Review of Systems  Constitutional:  Negative for fever, malaise/fatigue and weight loss.  Respiratory:  Positive for cough, shortness of breath and wheezing.   Cardiovascular:  Negative for chest pain, palpitations and leg swelling.  Gastrointestinal:  Positive for abdominal pain. Negative for blood in stool.  Genitourinary:  Negative for dysuria.  Musculoskeletal:  Negative for myalgias.  Skin:  Negative for rash.  Neurological:  Positive for dizziness and headaches.  Endo/Heme/Allergies:  Bruises/bleeds easily.  Psychiatric/Behavioral:  Negative for depression. The patient is not nervous/anxious.      OBJECTIVE Physical Exam: Vitals:   04/30/22 0944  BP: 131/80  Pulse: 66  Weight: 114 lb (51.7 kg)  Height: '4\' 8"'$  (1.422 m)    Physical Exam Constitutional:      General: She is not in acute distress. Pulmonary:     Effort: Pulmonary effort is normal.  Abdominal:     General: There is no distension.     Palpations: Abdomen is soft.  Tenderness: There is no  abdominal tenderness. There is no rebound.  Musculoskeletal:        General: No swelling. Normal range of motion.  Skin:    General: Skin is warm and dry.     Findings: No rash.  Neurological:     Mental Status: She is alert and oriented to person, place, and time.  Psychiatric:        Mood and Affect: Mood normal.        Behavior: Behavior normal.      GU / Detailed Urogynecologic Evaluation:  Pelvic Exam: Normal external female genitalia; Bartholin's and Skene's glands normal in appearance; urethral meatus normal in appearance, no urethral masses or discharge.   CST: negative   s/p hysterectomy: Speculum exam reveals normal vaginal mucosa with  atrophy and normal vaginal cuff.  Adnexa no mass, fullness, tenderness.    Pelvic floor strength II/V, puborectalis II/V external anal sphincter III/V  Pelvic floor musculature: Right levator tender, Right obturator tender, Left levator tender, Left obturator tender  POP-Q:   POP-Q  -1                                            Aa   -1                                           Ba  -5                                              C   2                                            Gh  2.5                                            Pb  5.5                                            tvl   -2                                            Ap  -2                                            Bp  D      Rectal Exam:  Normal sphincter tone, no distal rectocele, enterocoele not present, no rectal masses, no sign of dyssynergia when asking the patient to bear down.  Post-Void Residual (PVR): In order to evaluate bladder emptying, we discussed obtaining a postvoid residual and she agreed to this procedure.  Procedure: Urethra was prepped with betadine and straight catheter placed. A PVR of 50 ml was obtained.  Laboratory Results: POC urine:  negative  ASSESSMENT AND PLAN Ms. Howton  is a 81 y.o. with:  1. Levator spasm   2. Incontinence of feces, unspecified fecal incontinence type   3. Overactive bladder   4. SUI (stress urinary incontinence, female)   5. Urinary frequency    Levator spasm - The origin of pelvic floor muscle spasm can be multifactorial, including primary, reactive to a different pain source, trauma, or even part of a centralized pain syndrome.Treatment options include pelvic floor physical therapy, local (vaginal) or oral  muscle relaxants, pelvic muscle trigger point injections or centrally acting pain medications.   - referral placed to pelvic PT. Has tried valium suppositories in the past and did not have a good reaction to them.    2. Fecal incontinence - Treatment options include anti-diarrhea medication (loperamide/ Imodium OTC or prescription lomotil), fiber supplements, physical therapy, and possible sacral neuromodulation or surgery.   - She will start with pelvic PT and psyllium fiber supplement - She is considering SNM  3. OAB - We discussed the symptoms of overactive bladder (OAB), which include urinary urgency, urinary frequency, nocturia, with or without urge incontinence.  While we do not know the exact etiology of OAB, several treatment options exist. We discussed management including behavioral therapy (decreasing bladder irritants, urge suppression strategies, timed voids, bladder retraining), physical therapy, medication; for refractory cases posterior tibial nerve stimulation, sacral neuromodulation, and intravesical botulinum toxin injection.  - Started myrbetriq a few days ago with another physician, will continue for now - also considering PTNS or SNM  4. SUI - For treatment of stress urinary incontinence,  non-surgical options include expectant management, weight loss, physical therapy, as well as a pessary.  Surgical options include a midurethral sling, Burch urethropexy, and transurethral injection of a bulking agent. - Not as  bothersome to her. Will start with pelvic PT.   Return 3 months or sooner if needed  Jaquita Folds, MD

## 2022-04-30 NOTE — Patient Instructions (Addendum)
Today we talked about ways to manage bladder urgency such as altering your diet to avoid irritative beverages and foods (bladder diet) as well as attempting to decrease stress and other exacerbating factors.    The Most Bothersome Foods* The Least Bothersome Foods*  Coffee - Regular & Decaf Tea - caffeinated Carbonated beverages - cola, non-colas, diet & caffeine-free Alcohols - Beer, Red Wine, White Wine, Champagne Fruits - Grapefruit, [place], Orange, [employer] - Cranberry, Grapefruit, Orange, Pineapple Vegetables - Tomato & Tomato Products Flavor Enhancers - Hot peppers, Spicy foods, Chili, Horseradish, Vinegar, Monosodium glutamate (MSG) Artificial Sweeteners - NutraSweet, Sweet 'N Low, Equal (sweetener), Saccharin Ethnic foods - [place], [place], [place] food [employer] - low-fat & whole Fruits - Bananas, Blueberries, Honeydew melon, Pears, Raisins, Watermelon Vegetables - Broccoli, Brussels Sprouts, [place], Carrots, Cauliflower, Mars, Cucumber, Mushrooms, Peas, Radishes, Squash, Zucchini, White potatoes, Sweet potatoes & yams Poultry - Chicken, Eggs, [place], [employer] - Beef, Programmer, multimedia, Lamb Seafood - Shrimp, [place] fish, Salmon Grains - Oat, Rice Snacks - Pretzels, Popcorn  *Julie Wilkerson et al. Diet and its role in interstitial cystitis/bladder pain syndrome (IC/BPS) and comorbid conditions. [place] 2012 Jan 11.   Accidental Bowel Leakage: Our goal is to achieve formed bowel movements daily or every-other-day without leakage.  You may need to try different combinations of the following options to find what works best for you.  Some management options include: Dietary changes (more leafy greens, vegetables and fruits; less processed foods) Fiber supplementation (Metamucil or something with psyllium as active ingredient) Over-the-counter imodium (tablets or liquid) to help solidify the stool and prevent leakage of stool. If you get constipated you can use Miralax  as needed to achieve bowel movements.   We discussed the symptoms of overactive bladder (OAB), which include urinary urgency, urinary frequency, night-time urination, with or without urge incontinence.  We discussed management including behavioral therapy (decreasing bladder irritants by following a bladder diet, urge suppression strategies, timed voids, bladder retraining), physical therapy, medication; and for refractory cases posterior tibial nerve stimulation, sacral neuromodulation, and intravesical botulinum toxin injection.

## 2022-05-03 ENCOUNTER — Ambulatory Visit: Payer: Medicare HMO | Admitting: Obstetrics and Gynecology

## 2022-05-03 DIAGNOSIS — G4733 Obstructive sleep apnea (adult) (pediatric): Secondary | ICD-10-CM | POA: Diagnosis not present

## 2022-05-03 NOTE — Progress Notes (Deleted)
[facility]  PTNS VISIT  CC:  Overactive bladder  81 y.o. with refractory overactive bladder who presents for percutaneous tibial nerve stimulation. The patient presents for PTNS session # 1.   Procedure: The patient was placed in the sitting position and the {left/right:311354} lower extremity was prepped in the usual fashion. The PTNS needle was then inserted at a 60 degree angle, 5 cm cephalad and 2 cm posterior to the medial malleolus. The PTNS unit was then programmed and an optimal response was noted at *** milliamps. The PTNS stimulation was then performed at this setting for 30 minutes without incident and the patient tolerated the procedure well. The needle was removed and hemostasis was noted.   The pt will return in 1 week for PTNS session # 2. All questions were answered.

## 2022-05-05 ENCOUNTER — Encounter: Payer: Self-pay | Admitting: Family

## 2022-05-05 ENCOUNTER — Ambulatory Visit (INDEPENDENT_AMBULATORY_CARE_PROVIDER_SITE_OTHER): Payer: Medicare HMO | Admitting: Family

## 2022-05-05 VITALS — BP 130/53 | HR 63 | Temp 97.7°F | Resp 18 | Ht <= 58 in | Wt 115.8 lb

## 2022-05-05 DIAGNOSIS — J029 Acute pharyngitis, unspecified: Secondary | ICD-10-CM | POA: Insufficient documentation

## 2022-05-05 DIAGNOSIS — H9202 Otalgia, left ear: Secondary | ICD-10-CM

## 2022-05-05 LAB — POCT INFLUENZA A/B
Influenza A, POC: NEGATIVE
Influenza B, POC: NEGATIVE

## 2022-05-05 LAB — POCT RAPID STREP A (OFFICE): Rapid Strep A Screen: NEGATIVE

## 2022-05-05 LAB — POC COVID19 BINAXNOW: SARS Coronavirus 2 Ag: NEGATIVE

## 2022-05-05 MED ORDER — AMOXICILLIN 500 MG PO CAPS: 500.0000 mg | ORAL_CAPSULE | Freq: Three times a day (TID) | ORAL | 0 refills | Status: AC

## 2022-05-05 NOTE — Assessment & Plan Note (Signed)
Rapid strep is negative 

## 2022-05-05 NOTE — Assessment & Plan Note (Signed)
Will plan empiric rx with amoxicllin- I am unable to visualize L TM due to cerumen impaction.

## 2022-05-05 NOTE — Progress Notes (Signed)
Subjective:   By signing my name below, I, Julie Wilkerson, attest that this documentation has been prepared under the direction and in the presence of Julie Alar, NP. 05/05/2022   Patient ID: Julie Wilkerson, female    DOB: Jul 10, 1940, 81 y.o.   MRN: [id number]  Chief Complaint  Patient presents with   Sore Throat    Onset 05/03/2022 Headaches  Body aches  Ear ache      Sore Throat  Associated symptoms include ear pain (L>R) and headaches.   Patient is in today for a office visit.   She complains of headaches, sneezing, sore throat, headaches, ear pain (L>R), general body aches, fatigue since 05/03/2022. Her daughter at home has strep throat.    Past Medical History:  Diagnosis Date   Abdominal pain 11/07/2016   Accelerating angina ([place]) 19/14/7829   Acute diastolic CHF (congestive heart failure) ([place]) 02/16/2019   Acute on chronic diastolic CHF (congestive heart failure) ([place]) 02/23/2019   Acute pansinusitis 04/26/2017   Amblyopia of eye, left 04/19/2018   Anemia    Anginal pain (HCC)    Anxiety    Arthritis    Asthma    Atypical chest pain 02/04/2019   Bilateral carotid bruits 03/28/2017   Blood transfusion without reported diagnosis    Breast cancer ([place])    2002, left, encapsulated, microcalcifications. lumpectomy, radtiation x 30   Breast cancer in female [facility])    Bursitis of left hip 09/04/2018   Change in bowel habits 02/22/2018   Change in mole 06/08/2016   Chest pain 03/04/2020   CHF (congestive heart failure) (HCC)    Chronic bilateral thoracic back pain 05/23/2016   Chronic diastolic congestive heart failure ([place]) 05/28/2019   Colitis    Complication of anesthesia    VERY SENSITIVE TO ANESTHESIA    Constipation 01/17/2018   Coronary artery disease involving native coronary artery of native heart without angina pectoris 05/28/2019   Cortical age-related cataract of right eye 04/18/2018   Cough variant asthma  vs UACS from ACEi     Spirometry  12/02/2016  FEV1 1.26 (76%)  Ratio 78 with min curvature p last saba > 6 h prior  - trial off acei 12/02/2016  - 12/02/2016  After extensive coaching HFA effectiveness =    75% with qvar autohaler > rechallenge with 80 2bid     Decreased visual acuity 01/16/2017   Degeneration of lumbar intervertebral disc 08/01/2018   Diarrhea 11/16/2015   D/o Diverticuli, polyps, celiac disease.     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   Left hand pain 05/07/2018   Left hip pain 03/01/2017   Left leg pain 03/01/2017   Left shoulder pain 05/07/2018   Low back pain 08/10/2015   LUQ pain 06/01/2019   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   Neck pain 03/01/2017   Nuclear sclerotic cataract of  right eye 04/18/2018   Osteoporosis    Overweight (BMI 25.0-29.9) 02/14/2019   Pain in joint of left shoulder 08/11/2018   Paroxysmal atrial tachycardia 05/28/2019   Pelvic pain 07/02/2018   Personal history of radiation therapy    Pneumonia    PONV (postoperative nausea and vomiting)    Poor appetite 02/22/2018   Pseudophakia of left eye 04/18/2018   PVD (posterior vitreous detachment), right 04/24/2019   Restless sleeper 11/07/2016   Right hip pain 10/23/2017   RLQ discomfort 03/01/2017   RLS (restless legs syndrome) 05/07/2019   S/P left THA, AA 02/13/2019   Sepsis due to pneumonia ([place]) 02/16/2019   Sleep apnea    uses  CPAP   Stomach cramps    Tremor 02/15/2016   Urinary frequency 12/22/2017   Urinary incontinence 05/07/2019   Urinary tract infection 11/04/2016   Vitamin D deficiency 04/29/2016    Past Surgical History:  Procedure Laterality Date   ABDOMINAL HYSTERECTOMY     partial   APPENDECTOMY     BREAST LUMPECTOMY Left 2002   CHOLECYSTECTOMY  Age 25 or 3   COLONOSCOPY  2017   EYE SURGERY Left    cataract   LEFT HEART CATH AND CORONARY ANGIOGRAPHY N/A 02/09/2019   Procedure: LEFT HEART CATH AND CORONARY ANGIOGRAPHY;  Surgeon: Nelva Bush, MD;  Location: [facility];  Service: Cardiovascular;  Laterality: N/A;   TONSILLECTOMY     TOTAL HIP ARTHROPLASTY Left 02/13/2019   Procedure: TOTAL HIP ARTHROPLASTY ANTERIOR APPROACH;  Surgeon: Paralee Cancel, MD;  Location: [facility];  Service: Orthopedics;  Laterality: Left;  70 mins   UPPER GASTROINTESTINAL ENDOSCOPY      Family History  Problem Relation Age of Onset   Colitis Mother    Irritable bowel syndrome Mother    Hypertension Father    Hyperlipidemia Sister    Hyperlipidemia Brother    Heart attack Brother    Hyperlipidemia Brother    Heart attack Brother    Hyperlipidemia Brother    Heart attack Brother    Stomach cancer Paternal Grandmother 39   Colon cancer Paternal 57    Leukemia Daughter    Arthritis Daughter    Heart disease Daughter        ASD vs VSD   Esophageal cancer Neg Hx    Pancreatic cancer Neg Hx    Rectal cancer Neg Hx     Social History   Socioeconomic History   Marital status: Single    Spouse name: Not on file   Number of children: 3   Years of education: 18   Highest education level: Master's degree (e.g., MA, MS, MEng, MEd, MSW, MBA)  Occupational History   Occupation: retired    Comment: Pharmacist, hospital, kindergarten  Tobacco Use   Smoking status: Never   Smokeless tobacco: Never  Vaping Use   Vaping Use: Never used  Substance and Sexual Activity   Alcohol use: Never     Alcohol/week: 0.0 standard drinks of alcohol   Drug use: Never   Sexual activity: Not Currently    Birth control/protection: None    Comment: lives alone, avoids daiiry and gluten. 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  Other Topics Concern   Not on file  Social History Narrative   Not on file   Social Determinants of Health   Financial Resource Strain: Low Risk  (12/19/2020)   Overall Financial Resource Strain (CARDIA)    Difficulty of Paying Living Expenses: Not hard at all  Food Insecurity: No  Food Insecurity (04/28/2022)   Hunger Vital Sign    Worried About Running Out of Food in the Last Year: Never true    Ran Out of Food in the Last Year: Never true  Transportation Needs: No Transportation Needs (04/28/2022)   PRAPARE - Hydrologist (Medical): No    Lack of Transportation (Non-Medical): No  Physical Activity: Insufficiently Active (12/19/2020)   Exercise Vital Sign    Days of Exercise per Week: 1 day    Minutes of Exercise per Session: 30 min  Stress: No Stress Concern Present (12/19/2020)   [place]    Feeling of Stress : Not at all  Social Connections: Moderately Integrated (03/29/2022)   Social Connection and Isolation Panel [NHANES]    Frequency of Communication with Friends and Family: More than three times a week    Frequency of Social Gatherings with Friends and Family: More than three times a week    Attends Religious Services: More than 4 times per year    Active Member of [employer] or Organizations: Yes    Attends Music therapist: More than 4 times per year    Marital Status: Divorced  Intimate Partner Violence: Not At Risk (12/19/2020)   Humiliation, Afraid, Rape, and Kick questionnaire    Fear of Current or Ex-Partner: No    Emotionally Abused: No    Physically Abused: No    Sexually Abused: No    Outpatient Medications Prior to Visit  Medication Sig Dispense Refill    ASPIRIN 81 PO 81 mg daily.      citalopram (CELEXA) 20 MG tablet Take 1 tablet (20 mg total) by mouth at bedtime. 90 tablet 0   fexofenadine (ALLEGRA ALLERGY) 180 MG tablet Take 1 tablet (180 mg total) by mouth daily as needed for allergies or rhinitis.     fluticasone (FLOVENT HFA) 110 MCG/ACT inhaler Inhale 1-2 puffs into the lungs 2 (two) times daily as needed. 1 each 12   furosemide (LASIX) 40 MG tablet Take 1 tablet (40 mg total) by mouth daily. 90 tablet 1   hyoscyamine (LEVSIN SL) 0.125 MG SL tablet Place 1 tablet (0.125 mg total) under the tongue 2 (two) times daily. 30 tablet 1   ipratropium (ATROVENT) 0.02 % nebulizer solution Take 2.5 mLs (0.5 mg total) by nebulization every 6 (six) hours as needed for wheezing or shortness of breath. 75 mL 12   levothyroxine (SYNTHROID) 50 MCG tablet TAKE 1 TABLET BY MOUTH ONCE DAILY BEFORE BREAKFAST 90 tablet 0   metoprolol succinate (TOPROL-XL) 25 MG 24 hr tablet Take 1 tablet (25 mg total) by mouth at bedtime. 90 tablet 0   mirabegron ER (MYRBETRIQ) 25 MG TB24 tablet Take 1 tablet (25 mg total) by mouth daily. 28 tablet 0   NON FORMULARY as needed (wheezing, SOB). Iprasynt inhaler (unable to find english equivalent med name)     omega-3 acid ethyl esters (LOVAZA) 1 g capsule Take 2 g by mouth daily.     omeprazole (PRILOSEC) 40 MG capsule Take 1 capsule (40 mg total) by mouth in the morning and at bedtime. Take 1 tablet by mouth once to twice daily 60 capsule 5   ondansetron (ZOFRAN) 4 MG tablet Dissolve 1 tablet under tongue once to twice a day, every day 90 tablet 3   potassium chloride (KLOR-CON M) 10 MEQ tablet Take 1 tablet (10 mEq total) by mouth daily. 90 tablet 1  sucralfate (CARAFATE) 1 g tablet Take 1 tablet by mouth twice daily 60 tablet 0   lisinopril (ZESTRIL) 10 MG tablet Take 1 tablet (10 mg total) by mouth 2 (two) times daily as needed. (Patient not taking: Reported on 05/05/2022) 180 tablet 0   No facility-administered  medications prior to visit.    Allergies  Allergen Reactions   Valium [Diazepam] Shortness Of Breath    Sob and bradycardia   Albuterol And Levalbuterol Palpitations    Episode of palpitations, tremor and anxiety when administered levalbuterol for PFT. Recommend avoiding this medicaiton class    Review of Systems  Constitutional:  Positive for malaise/fatigue.  HENT:  Positive for ear pain (L>R) and sore throat.   Musculoskeletal:  Positive for myalgias.  Neurological:  Positive for headaches.       Objective:    Physical Exam Constitutional:      General: She is not in acute distress.    Appearance: Normal appearance. She is not ill-appearing.  HENT:     Head: Normocephalic and atraumatic.     Right Ear: Tympanic membrane, ear canal and external ear normal.     Left Ear: Ear canal and external ear normal. There is impacted cerumen.     Mouth/Throat:     Mouth: Mucous membranes are moist.     Pharynx: Oropharynx is clear. No oropharyngeal exudate or posterior oropharyngeal erythema.  Eyes:     Extraocular Movements: Extraocular movements intact.     Pupils: Pupils are equal, round, and reactive to light.  Cardiovascular:     Rate and Rhythm: Normal rate and regular rhythm.     Heart sounds: Normal heart sounds. No murmur heard.    No gallop.  Pulmonary:     Effort: Pulmonary effort is normal. No respiratory distress.     Breath sounds: Normal breath sounds. No wheezing or rales.     Comments: Tested negative for Strep throat Skin:    General: Skin is warm and dry.  Neurological:     Mental Status: She is alert and oriented to person, place, and time.  Psychiatric:        Judgment: Judgment normal.     BP (!) 130/53 (BP Location: Right Arm, Patient Position: Sitting, Cuff Size: Normal)   Pulse 63   Temp 97.7 F (36.5 C) (Oral)   Resp 18   Ht '4\' 8"'$  (1.422 m)   Wt 115 lb 12.8 oz (52.5 kg)   SpO2 98%   BMI 25.96 kg/m  Wt Readings from Last 3 Encounters:   05/05/22 115 lb 12.8 oz (52.5 kg)  04/30/22 114 lb (51.7 kg)  04/28/22 113 lb (51.3 kg)       Assessment & Plan:  Sore throat Assessment & Plan: Rapid strep is negative.   Orders: -     POCT rapid strep A -     POC COVID-19 BinaxNow -     POCT Influenza A/B  Otalgia of left ear Assessment & Plan: Will plan empiric rx with amoxicllin- I am unable to visualize L TM due to cerumen impaction.    Other orders -     Amoxicillin; Take 1 capsule (500 mg total) by mouth 3 (three) times daily for 10 days.  Dispense: 30 capsule; Refill: 0    I, Nance Pear, NP, personally preformed the services described in this documentation.  All medical record entries made by the scribe were at my direction and in my presence.  I have reviewed the chart  and discharge instructions (if applicable) and agree that the record reflects my personal performance and is accurate and complete. 05/05/2022  Nance Pear, NP

## 2022-05-10 ENCOUNTER — Ambulatory Visit: Payer: Medicare HMO | Admitting: Obstetrics and Gynecology

## 2022-05-19 ENCOUNTER — Ambulatory Visit: Payer: Medicare HMO | Admitting: Obstetrics and Gynecology

## 2022-05-25 ENCOUNTER — Ambulatory Visit: Payer: Medicare HMO | Admitting: Obstetrics and Gynecology

## 2022-05-28 ENCOUNTER — Ambulatory Visit: Payer: Medicare HMO | Admitting: Primary Care

## 2022-05-31 ENCOUNTER — Ambulatory Visit: Payer: Medicare HMO | Admitting: Urology

## 2022-05-31 ENCOUNTER — Encounter: Payer: Self-pay | Admitting: Urology

## 2022-05-31 ENCOUNTER — Ambulatory Visit: Payer: Medicare HMO | Admitting: Obstetrics and Gynecology

## 2022-05-31 ENCOUNTER — Encounter: Payer: Self-pay | Admitting: Obstetrics and Gynecology

## 2022-05-31 VITALS — BP 139/75

## 2022-05-31 DIAGNOSIS — Z809 Family history of malignant neoplasm, unspecified: Secondary | ICD-10-CM | POA: Diagnosis not present

## 2022-05-31 DIAGNOSIS — N3281 Overactive bladder: Secondary | ICD-10-CM | POA: Diagnosis not present

## 2022-05-31 DIAGNOSIS — G4733 Obstructive sleep apnea (adult) (pediatric): Secondary | ICD-10-CM | POA: Diagnosis not present

## 2022-05-31 DIAGNOSIS — E876 Hypokalemia: Secondary | ICD-10-CM | POA: Diagnosis not present

## 2022-05-31 DIAGNOSIS — I110 Hypertensive heart disease with heart failure: Secondary | ICD-10-CM | POA: Diagnosis not present

## 2022-05-31 DIAGNOSIS — R69 Illness, unspecified: Secondary | ICD-10-CM | POA: Diagnosis not present

## 2022-05-31 DIAGNOSIS — I509 Heart failure, unspecified: Secondary | ICD-10-CM | POA: Diagnosis not present

## 2022-05-31 DIAGNOSIS — E039 Hypothyroidism, unspecified: Secondary | ICD-10-CM | POA: Diagnosis not present

## 2022-05-31 DIAGNOSIS — M810 Age-related osteoporosis without current pathological fracture: Secondary | ICD-10-CM | POA: Diagnosis not present

## 2022-05-31 DIAGNOSIS — R350 Frequency of micturition: Secondary | ICD-10-CM | POA: Diagnosis not present

## 2022-05-31 DIAGNOSIS — Z008 Encounter for other general examination: Secondary | ICD-10-CM | POA: Diagnosis not present

## 2022-05-31 DIAGNOSIS — N3941 Urge incontinence: Secondary | ICD-10-CM | POA: Diagnosis not present

## 2022-05-31 DIAGNOSIS — Z8249 Family history of ischemic heart disease and other diseases of the circulatory system: Secondary | ICD-10-CM | POA: Diagnosis not present

## 2022-05-31 DIAGNOSIS — K219 Gastro-esophageal reflux disease without esophagitis: Secondary | ICD-10-CM | POA: Diagnosis not present

## 2022-05-31 NOTE — Patient Instructions (Signed)
It was nice meeting you! I will see you next week for PTNS session #2

## 2022-05-31 NOTE — Progress Notes (Deleted)
Assessment: 1. Urinary urgency   2. Urge incontinence   3. Lower abdominal pain   4. History of UTI   5. Post-menopausal atrophic vaginitis      Plan: I reviewed the patient's chart including office notes, lab results, and imaging results. Diagnosis and management of OAB and urge incontinence discussed with the patient including behavioral modification, avoidance of dietary irritants, medical therapy, neuromodulation, and chemodenervation. Bladder diet sheet provided Trial of Myrbetriq 25 mg daily.  Samples given.   Return to office in 1 month.  Chief Complaint:  No chief complaint on file.   History of Present Illness:  Julie Wilkerson is a 82 y.o. female who is seen for further evaluation of urinary urgency and urge incontinence.   At her initial visit on 04/28/22, she reported urinary symptoms for a number of years. Her symptoms include urinary frequency, urgency, nocturia, and urge incontinence.  She has been using incontinence pads, 1-2 many pads/day.  No dysuria or gross hematuria.  She had not tried any medical therapy for her symptoms. She reported a history of frequent UTIs.  Her last UTI was in July 2023.  Urine culture at that time grew 10-49 K Enterococcus. She reported fairly constant lower abdominal pain, worsened with the need to void.  She has previously been evaluated by GI.  She does have ongoing problems with diarrhea. CT imaging from January 2023 showed no renal or ureteral calculi, no evidence of obstruction, and an unremarkable bladder. She was previously evaluated by urogynecology in [facility].  She was seen in June 2023 for urinary incontinence and pelvic pain.  Bladder scan showed a volume of 65 mL.  Pelvic exam demonstrated no urethral hypermobility, tender bladder, atrophic changes, hypertonic pelvic floor, and no prolapse.  She was started on vaginal hormone cream.  She was also given a prescription for vaginal Valium suppositories and pelvic floor physical  therapy was recommended.  She did not tolerate the vaginal Valium and did not proceed with physical therapy.  PVR from 12/23:  79 ml She was started on Myrbetriq 25 mg daily. She was added by Dr. Wannetta Sender with urogynecology.  She was referred for pelvic floor physical therapy.  Portions of the above documentation were copied from a prior visit for review purposes only.    Past Medical History:  Past Medical History:  Diagnosis Date   Abdominal pain 11/07/2016   Accelerating angina ([place]) 14/48/1856   Acute diastolic CHF (congestive heart failure) ([place]) 02/16/2019   Acute on chronic diastolic CHF (congestive heart failure) ([place]) 02/23/2019   Acute pansinusitis 04/26/2017   Amblyopia of eye, left 04/19/2018   Anemia    Anginal pain (HCC)    Anxiety    Arthritis    Asthma    Atypical chest pain 02/04/2019   Bilateral carotid bruits 03/28/2017   Blood transfusion without reported diagnosis    Breast cancer ([place])    2002, left, encapsulated, microcalcifications. lumpectomy, radtiation x 30   Breast cancer in female [facility])    Bursitis of left hip 09/04/2018   Change in bowel habits 02/22/2018   Change in mole 06/08/2016   Chest pain 03/04/2020   CHF (congestive heart failure) (HCC)    Chronic bilateral thoracic back pain 05/23/2016   Chronic diastolic congestive heart failure ([place]) 05/28/2019   Colitis    Complication of anesthesia    VERY SENSITIVE TO ANESTHESIA    Constipation 01/17/2018   Coronary artery disease involving native coronary artery of native heart  without angina pectoris 05/28/2019   Cortical age-related cataract of right eye 04/18/2018   Cough variant asthma  vs UACS from ACEi     Spirometry 12/02/2016  FEV1 1.26 (76%)  Ratio 78 with min curvature p last saba > 6 h prior  - trial off acei 12/02/2016  - 12/02/2016  After extensive coaching HFA effectiveness =    75% with qvar autohaler > rechallenge with 80 2bid     Decreased visual acuity 01/16/2017   Degeneration  of lumbar intervertebral disc 08/01/2018   Diarrhea 11/16/2015   D/o Diverticuli, polyps, celiac disease.     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   Left hand pain 05/07/2018   Left hip pain 03/01/2017   Left leg pain 03/01/2017   Left shoulder pain 05/07/2018   Low back pain 08/10/2015   LUQ pain 06/01/2019   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   Neck pain 03/01/2017   Nuclear sclerotic cataract of right eye 04/18/2018   Osteoporosis    Overweight (BMI 25.0-29.9) 02/14/2019   Pain in joint of left shoulder 08/11/2018   Paroxysmal atrial tachycardia 05/28/2019   Pelvic pain 07/02/2018   Personal history of radiation therapy    Pneumonia    PONV (postoperative nausea and vomiting)    Poor appetite 02/22/2018   Pseudophakia of left eye 04/18/2018   PVD (posterior vitreous detachment), right 04/24/2019   Restless sleeper 11/07/2016   Right hip pain 10/23/2017   RLQ discomfort 03/01/2017   RLS (restless legs syndrome) 05/07/2019   S/P left THA, AA 02/13/2019   Sepsis due to pneumonia ([place]) 02/16/2019   Sleep apnea    uses CPAP   Stomach cramps    Tremor 02/15/2016   Urinary frequency 12/22/2017   Urinary incontinence 05/07/2019   Urinary tract infection 11/04/2016   Vitamin D deficiency 04/29/2016    Past Surgical History:  Past Surgical History:  Procedure Laterality Date    ABDOMINAL HYSTERECTOMY     partial   APPENDECTOMY     BREAST LUMPECTOMY Left 2002   CHOLECYSTECTOMY  Age 3 or 65   COLONOSCOPY  2017   EYE SURGERY Left    cataract   LEFT HEART CATH AND CORONARY ANGIOGRAPHY N/A 02/09/2019   Procedure: LEFT HEART CATH AND CORONARY ANGIOGRAPHY;  Surgeon: Nelva Bush, MD;  Location: [place] [facility];  Service: Cardiovascular;  Laterality: N/A;   TONSILLECTOMY     TOTAL HIP ARTHROPLASTY Left 02/13/2019   Procedure: TOTAL HIP ARTHROPLASTY ANTERIOR APPROACH;  Surgeon: Paralee Cancel, MD;  Location: [facility];  Service: Orthopedics;  Laterality: Left;  70 mins   UPPER GASTROINTESTINAL ENDOSCOPY      Allergies:  Allergies  Allergen Reactions   Valium [Diazepam] Shortness Of Breath    Sob and bradycardia   Albuterol And Levalbuterol Palpitations    Episode of palpitations, tremor and anxiety when administered  levalbuterol for PFT. Recommend avoiding this medicaiton class    Family History:  Family History  Problem Relation Age of Onset   Colitis Mother    Irritable bowel syndrome Mother    Hypertension Father    Hyperlipidemia Sister    Hyperlipidemia Brother    Heart attack Brother    Hyperlipidemia Brother    Heart attack Brother    Hyperlipidemia Brother    Heart attack Brother    Stomach cancer Paternal Grandmother 20   Colon cancer Paternal Grandmother    Leukemia Daughter    Arthritis Daughter    Heart disease Daughter        ASD vs VSD   Esophageal cancer Neg Hx    Pancreatic cancer Neg Hx    Rectal cancer Neg Hx     Social History:  Social History   Tobacco Use   Smoking status: Never   Smokeless tobacco: Never  Vaping Use   Vaping Use: Never used  Substance Use Topics   Alcohol use: Never    Alcohol/week: 0.0 standard drinks of alcohol   Drug use: Never    ROS: Constitutional:  Negative for fever, chills, weight loss CV: Negative for chest pain, previous MI, hypertension Respiratory:  Negative for shortness of  breath, wheezing, sleep apnea, frequent cough GI:  Negative for nausea, vomiting, bloody stool, GERD  Physical exam: There were no vitals taken for this visit. GENERAL APPEARANCE:  Well appearing, well developed, well nourished, NAD HEENT:  Atraumatic, normocephalic, oropharynx clear NECK:  Supple without lymphadenopathy or thyromegaly ABDOMEN:  Soft, non-tender, no masses EXTREMITIES:  Moves all extremities well, without clubbing, cyanosis, or edema NEUROLOGIC:  Alert and oriented x 3, normal gait, CN II-XII grossly intact MENTAL STATUS:  appropriate BACK:  Non-tender to palpation, No CVAT SKIN:  Warm, dry, and intact  Results: U/A:

## 2022-05-31 NOTE — Progress Notes (Signed)
[facility]  PTNS VISIT  CC:  Overactive bladder  82 y.o. with refractory overactive bladder who presents for percutaneous tibial nerve stimulation. The patient presents for PTNS session # 1.   Procedure: The patient was placed in the sitting position and the right lower extremity was prepped in the usual fashion. The PTNS needle was then inserted at a 60 degree angle, 5 cm cephalad and 2 cm posterior to the medial malleolus. The PTNS unit was then programmed and an optimal response was noted at 3 milliamps. The PTNS stimulation was then performed at this setting for 30 minutes without incident and the patient tolerated the procedure well. The needle was removed and hemostasis was noted.   The pt will return in 1 week for PTNS session # 2. All questions were answered.

## 2022-06-02 ENCOUNTER — Other Ambulatory Visit: Payer: Self-pay | Admitting: Family Medicine

## 2022-06-02 ENCOUNTER — Telehealth: Payer: Self-pay | Admitting: Urology

## 2022-06-02 NOTE — Telephone Encounter (Signed)
Patient called & was requesting a refill of medication listed below. Patient also rescheduled her missed appointment on Monday to tomorrow morning, 06/03/22 @ 10:45am.   mirabegron ER (MYRBETRIQ) 25 MG TB24 tablet    [facility] - [place], [place] - [place] Phone: [phone]  Fax: [phone]

## 2022-06-03 ENCOUNTER — Ambulatory Visit: Payer: Medicare HMO | Admitting: Primary Care

## 2022-06-03 ENCOUNTER — Ambulatory Visit: Payer: Medicare HMO | Admitting: Urology

## 2022-06-03 ENCOUNTER — Encounter: Payer: Self-pay | Admitting: Urology

## 2022-06-03 VITALS — BP 134/76 | HR 64 | Ht <= 58 in | Wt 112.0 lb

## 2022-06-03 DIAGNOSIS — R3915 Urgency of urination: Secondary | ICD-10-CM

## 2022-06-03 DIAGNOSIS — Z87440 Personal history of urinary (tract) infections: Secondary | ICD-10-CM | POA: Diagnosis not present

## 2022-06-03 DIAGNOSIS — N952 Postmenopausal atrophic vaginitis: Secondary | ICD-10-CM | POA: Diagnosis not present

## 2022-06-03 DIAGNOSIS — N3941 Urge incontinence: Secondary | ICD-10-CM

## 2022-06-03 MED ORDER — MIRABEGRON ER 25 MG PO TB24: 25.0000 mg | ORAL_TABLET | Freq: Every day | ORAL | 11 refills | Status: DC

## 2022-06-03 NOTE — Addendum Note (Signed)
Addended by: Evelina Bucy on: 06/03/2022 12:11 PM   Modules accepted: Orders

## 2022-06-03 NOTE — Progress Notes (Signed)
Assessment: 1. Urge incontinence   2. Urinary urgency   3. History of UTI   4. Post-menopausal atrophic vaginitis     Plan: I reviewed the patient's chart including notes from Dr. Wannetta Sender. Continue bladder diet  Continue Myrbetriq 25 mg daily.  Samples and prescription provided. Agree with recommendations for pelvic floor physical therapy and PTNS. Return to office in 59-month  Chief Complaint:  Chief Complaint  Patient presents with   Urinary Incontinence    History of Present Illness:  Julie BRUBACHERis a 82y.o. female who is seen for further evaluation of urinary urgency and urge incontinence.   At her initial visit on 04/28/22, she reported urinary symptoms for a number of years. Her symptoms include urinary frequency, urgency, nocturia, and urge incontinence.  She has been using incontinence pads, 1-2 many pads/day.  No dysuria or gross hematuria.  She had not tried any medical therapy for her symptoms. She reported a history of frequent UTIs.  Her last UTI was in July 2023.  Urine culture at that time grew 10-49 K Enterococcus. She reported fairly constant lower abdominal pain, worsened with the need to void.  She has previously been evaluated by GI.  She does have ongoing problems with diarrhea. CT imaging from January 2023 showed no renal or ureteral calculi, no evidence of obstruction, and an unremarkable bladder. She was previously evaluated by urogynecology in [facility]  She was seen in June 2023 for urinary incontinence and pelvic pain.  Bladder scan showed a volume of 65 mL.  Pelvic exam demonstrated no urethral hypermobility, tender bladder, atrophic changes, hypertonic pelvic floor, and no prolapse.  She was started on vaginal hormone cream.  She was also given a prescription for vaginal Valium suppositories and pelvic floor physical therapy was recommended.  She did not tolerate the vaginal Valium and did not proceed with physical therapy.  PVR from 12/23:  79  ml She was started on Myrbetriq 25 mg daily in 12/23. She was evaluated by Dr. SWannetta Senderwith urogynecology and was referred for pelvic floor physical therapy. She also started PTNS on 05/31/22.  She returns today for follow-up.  She reports improvement in her frequency, urgency, and urge incontinence with the Myrbetriq.  She was not having any incontinence episodes while on the medication.  She recently ran out of her samples.  No side effects from the medication.  No dysuria or gross hematuria.  I have fairly constant lower abdominal pain.  This was unchanged with Myrbetriq.  Portions of the above documentation were copied from a prior visit for review purposes only.    Past Medical History:  Past Medical History:  Diagnosis Date   Abdominal pain 11/07/2016   Accelerating angina ([place] 025/09/3974  Acute diastolic CHF (congestive heart failure) ([facility] 02/16/2019   Acute on chronic diastolic CHF (congestive heart failure) ([place] 02/23/2019   Acute pansinusitis 04/26/2017   Amblyopia of eye, left 04/19/2018   Anemia    Anginal pain (HCC)    Anxiety    Arthritis    Asthma    Atypical chest pain 02/04/2019   Bilateral carotid bruits 03/28/2017   Blood transfusion without reported diagnosis    Breast cancer ([facility]    2002, left, encapsulated, microcalcifications. lumpectomy, radtiation x 30   Breast cancer in female ([facility]    Bursitis of left hip 09/04/2018   Change in bowel habits 02/22/2018   Change in mole 06/08/2016   Chest pain 03/04/2020   CHF (congestive  heart failure) (HCC)    Chronic bilateral thoracic back pain 05/23/2016   Chronic diastolic congestive heart failure ([place]) 05/28/2019   Colitis    Complication of anesthesia    VERY SENSITIVE TO ANESTHESIA    Constipation 01/17/2018   Coronary artery disease involving native coronary artery of native heart without angina pectoris 05/28/2019   Cortical age-related cataract of right eye 04/18/2018   Cough variant asthma  vs  UACS from ACEi     Spirometry 12/02/2016  FEV1 1.26 (76%)  Ratio 78 with min curvature p last saba > 6 h prior  - trial off acei 12/02/2016  - 12/02/2016  After extensive coaching HFA effectiveness =    75% with qvar autohaler > rechallenge with 80 2bid     Decreased visual acuity 01/16/2017   Degeneration of lumbar intervertebral disc 08/01/2018   Diarrhea 11/16/2015   D/o Diverticuli, polyps, celiac disease.     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   Left hand pain 05/07/2018   Left hip pain 03/01/2017   Left leg pain 03/01/2017   Left shoulder pain 05/07/2018   Low back pain 08/10/2015   LUQ pain 06/01/2019   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   Neck pain 03/01/2017   Nuclear sclerotic cataract of right eye 04/18/2018   Osteoporosis    Overweight (BMI 25.0-29.9) 02/14/2019   Pain in joint of left shoulder 08/11/2018   Paroxysmal atrial tachycardia 05/28/2019   Pelvic pain 07/02/2018   Personal history of radiation therapy    Pneumonia    PONV (postoperative nausea and vomiting)    Poor appetite 02/22/2018   Pseudophakia of left eye 04/18/2018   PVD (posterior vitreous detachment), right 04/24/2019   Restless sleeper 11/07/2016   Right hip pain 10/23/2017   RLQ discomfort 03/01/2017   RLS (restless legs syndrome) 05/07/2019   S/P left THA, AA 02/13/2019   Sepsis due to pneumonia ([place])  02/16/2019   Sleep apnea    uses CPAP   Stomach cramps    Tremor 02/15/2016   Urinary frequency 12/22/2017   Urinary incontinence 05/07/2019   Urinary tract infection 11/04/2016   Vitamin D deficiency 04/29/2016    Past Surgical History:  Past Surgical History:  Procedure Laterality Date   ABDOMINAL HYSTERECTOMY     partial   APPENDECTOMY     BREAST LUMPECTOMY Left 2002   CHOLECYSTECTOMY  Age 64 or 30   COLONOSCOPY  2017   EYE SURGERY Left    cataract   LEFT HEART CATH AND CORONARY ANGIOGRAPHY N/A 02/09/2019   Procedure: LEFT HEART CATH AND CORONARY ANGIOGRAPHY;  Surgeon: Nelva Bush, MD;  Location: [place] [facility];  Service: Cardiovascular;  Laterality: N/A;   TONSILLECTOMY     TOTAL HIP ARTHROPLASTY Left 02/13/2019   Procedure: TOTAL HIP ARTHROPLASTY ANTERIOR APPROACH;  Surgeon: Paralee Cancel, MD;  Location: [facility];  Service: Orthopedics;  Laterality: Left;  70 mins   UPPER GASTROINTESTINAL ENDOSCOPY      Allergies:  Allergies  Allergen Reactions   Valium [Diazepam] Shortness Of Breath    Sob and bradycardia   Albuterol And Levalbuterol Palpitations    Episode of palpitations, tremor and anxiety when administered levalbuterol for PFT. Recommend avoiding this medicaiton class    Family History:  Family History  Problem Relation Age of Onset   Colitis Mother    Irritable bowel syndrome Mother    Hypertension Father    Hyperlipidemia Sister    Hyperlipidemia Brother    Heart attack Brother    Hyperlipidemia Brother    Heart attack Brother    Hyperlipidemia Brother    Heart attack Brother    Stomach cancer Paternal Grandmother 82   Colon cancer Paternal Grandmother    Leukemia Daughter    Arthritis Daughter    Heart disease Daughter        ASD vs VSD   Esophageal cancer Neg Hx    Pancreatic cancer Neg Hx    Rectal cancer Neg Hx     Social History:  Social History   Tobacco Use   Smoking status: Never   Smokeless tobacco: Never  Vaping Use    Vaping Use: Never used  Substance Use Topics   Alcohol use: Never    Alcohol/week: 0.0 standard drinks of alcohol   Drug use: Never    ROS: Constitutional:  Negative for fever, chills, weight loss CV: Negative for chest pain, previous MI, hypertension Respiratory:  Negative for shortness of breath, wheezing, sleep apnea, frequent cough GI:  Negative for nausea, vomiting, bloody stool, GERD  Physical exam: BP 134/76   Pulse 64   Ht '4\' 10"'$  (1.473 m)   Wt 112 lb (50.8 kg)   BMI 23.41 kg/m  GENERAL APPEARANCE:  Well appearing, well developed, well nourished, NAD HEENT:  Atraumatic, normocephalic, oropharynx clear NECK:  Supple without lymphadenopathy or thyromegaly ABDOMEN:  Soft, non-tender, no masses EXTREMITIES:  Moves all extremities well, without clubbing, cyanosis, or edema NEUROLOGIC:  Alert and oriented x 3, normal gait, CN II-XII grossly intact MENTAL STATUS:  appropriate BACK:  Non-tender to palpation, No CVAT SKIN:  Warm, dry, and intact  Results: No sample provided

## 2022-06-07 ENCOUNTER — Encounter: Payer: Self-pay | Admitting: Obstetrics and Gynecology

## 2022-06-07 ENCOUNTER — Ambulatory Visit (INDEPENDENT_AMBULATORY_CARE_PROVIDER_SITE_OTHER): Payer: Medicare HMO | Admitting: Obstetrics and Gynecology

## 2022-06-07 VITALS — BP 125/71 | HR 75

## 2022-06-07 DIAGNOSIS — N3281 Overactive bladder: Secondary | ICD-10-CM | POA: Diagnosis not present

## 2022-06-07 DIAGNOSIS — R350 Frequency of micturition: Secondary | ICD-10-CM

## 2022-06-07 NOTE — Progress Notes (Signed)
[facility]  PTNS VISIT  CC:  Overactive bladder  82 y.o. with refractory overactive bladder who presents for percutaneous tibial nerve stimulation. The patient presents for PTNS session # 2.   Procedure: The patient was placed in the sitting position and the right lower extremity was prepped in the usual fashion. The PTNS needle was then inserted at a 60 degree angle, 5 cm cephalad and 2 cm posterior to the medial malleolus. The PTNS unit was then programmed and an optimal response was noted at 2 milliamps. The PTNS stimulation was then performed at this setting for 30 minutes without incident and the patient tolerated the procedure well. The needle was removed and hemostasis was noted.   The pt will return in 1 week for PTNS session # 3. All questions were answered.

## 2022-06-11 NOTE — Progress Notes (Signed)
[facility]  PTNS VISIT  CC:  Overactive bladder  82 y.o. with refractory overactive bladder who presents for percutaneous tibial nerve stimulation. The patient presents for PTNS session # 3.   Procedure: The patient was placed in the sitting position and the left lower extremity was prepped in the usual fashion. The PTNS needle was then inserted at a 60 degree angle, 5 cm cephalad and 2 cm posterior to the medial malleolus. The PTNS unit was then programmed and an optimal response was noted at 7 milliamps. The PTNS stimulation was then performed at this setting for 30 minutes without incident and the patient tolerated the procedure well. The needle was removed and hemostasis was noted.   The pt will return in 1 week for PTNS session # 4. All questions were answered.

## 2022-06-13 ENCOUNTER — Encounter: Payer: Self-pay | Admitting: Urology

## 2022-06-14 ENCOUNTER — Ambulatory Visit: Payer: Medicare HMO | Admitting: Obstetrics and Gynecology

## 2022-06-14 ENCOUNTER — Encounter: Payer: Self-pay | Admitting: Obstetrics and Gynecology

## 2022-06-14 VITALS — BP 112/71 | HR 78

## 2022-06-14 DIAGNOSIS — R350 Frequency of micturition: Secondary | ICD-10-CM | POA: Diagnosis not present

## 2022-06-14 DIAGNOSIS — N3281 Overactive bladder: Secondary | ICD-10-CM | POA: Diagnosis not present

## 2022-06-14 DIAGNOSIS — R351 Nocturia: Secondary | ICD-10-CM | POA: Diagnosis not present

## 2022-06-15 ENCOUNTER — Encounter: Payer: Self-pay | Admitting: Primary Care

## 2022-06-15 ENCOUNTER — Ambulatory Visit: Payer: Medicare HMO | Admitting: Primary Care

## 2022-06-15 VITALS — BP 112/68 | HR 69 | Ht <= 58 in | Wt 116.6 lb

## 2022-06-15 DIAGNOSIS — G4730 Sleep apnea, unspecified: Secondary | ICD-10-CM | POA: Diagnosis not present

## 2022-06-15 DIAGNOSIS — G4733 Obstructive sleep apnea (adult) (pediatric): Secondary | ICD-10-CM

## 2022-06-15 NOTE — Progress Notes (Signed)
$'@Patient'J$  ID: Julie Wilkerson, female    DOB: [birth year], 82 y.o.   MRN: [id number]  Chief Complaint  Patient presents with   Follow-up    OSA not using CPAP    Referring provider: [name], MD  HPI: 82 year old female, never smoked.  Past medical history significant for hypertension, chronic congestive heart failure, coronary artery disease, atrial tachycardia, asthma, sleep apnea.  Previous [facility] encounter: 04/16/2022 Patient presents today for sleep consult. She follows with Dr. Erin Fulling with [facility] for chronic cough/asthma. She was referred by him during her last office visit d/t history of sleep apnea, she is having issues tolerating CPAP and issues with mask fit. Dr. Claiborne Billings with cardiology was managing her sleep apnea. Home sleep study in November 2021 showed moderate obstructive sleep apnea, average AHI 18/hour. CPAP machine not working well, mask reportedly coming off. It took a long time for her to be able to tolerate her CPAP machine. Her daughter states that most masks were too big. She is currently using Airfit [id number] nasal mask size small, which was working well for her until recently. She reports sinus discomfort with use. She is not getting up any purulent mucus. She uses flonase 1-2 times a day. She is using humidification with CPAP.  Dme is [facility].   Airview download 04/16/21-04/15/22 Days used greater than 4 hours - 291/355 (81%) Average usage - 5 hours 58 minutes Leak 95th percentile- 27 L/min Average AHI 3.3 events per hour  Sleep questionnaire Symptoms- CPAP machine not working     Prior sleep study-Home sleep study 2021 Bedtime- 9:30pm Time to fall asleep- 10-15 mins Nocturnal awakenings- 2-3 times  Out of bed/start of day- 8am Weight changes- n/a Do you operate heavy machinery- no Do you currently wear CPAP- yes Do you current wear oxygen- no Epworth- 9  06/15/2022 Patient presents today for follow-up/OSA. Sleep study November  2021 showed moderate obstructive sleep apnea.  She has been on CPAP for the last 1 to 2 years without issue.  Recently she has been calling with mask fit.  During her last overview we placed an order for her to have mask desensitization study. She received new CPAP mask.  She is still having issue with mask, mainly the back of the headgear is bothering her. She has been unable to wear CPAP. We re-adjusted headgear in office today. She plans to re-try CPAP. She is also interested in alternative treatment options for OSA. She would like to be considered for oral appliance   She has been sleeping well recently. No snoring and waking up gasping for air. She does wake up to use restroom still at night and has some fatigue symptoms.   Airview download 03/15/22-06/12/22 Usage 14/90 days (16%); 9 days (10%) > 4 hours Average usage days used 5 hours 17 mins Pressure 8-14cm h20 Airleaks 11L/min (95%) AHI 2.3  Allergies  Allergen Reactions   Valium [Diazepam] Shortness Of Breath    Sob and bradycardia   Albuterol And Levalbuterol Palpitations    Episode of palpitations, tremor and anxiety when administered levalbuterol for PFT. Recommend avoiding this medicaiton class    Immunization History  Administered Date(s) Administered   Fluad Quad(high Dose 65+) 02/01/2019, 05/29/2020, 03/17/2021, 03/25/2022   Influenza, High Dose Seasonal PF 03/11/2015, 02/05/2016, 02/05/2017, 03/23/2018   Influenza,inj,quad, With Preservative 05/23/2014, 03/24/2018   PFIZER(Purple Top)SARS-COV-2 Vaccination 06/13/2019, 07/04/2019, 04/22/2020   Pneumococcal Conjugate-13 02/01/2019   Pneumococcal Polysaccharide-23 05/23/2014   Td 02/05/2016  Zoster, Live 05/24/2009    Past Medical History:  Diagnosis Date   Abdominal pain 11/07/2016   Accelerating angina ([place]) 67/04/4579   Acute diastolic CHF (congestive heart failure) ([place]) 02/16/2019   Acute on chronic diastolic CHF (congestive heart failure) ([place]) 02/23/2019    Acute pansinusitis 04/26/2017   Amblyopia of eye, left 04/19/2018   Anemia    Anginal pain (HCC)    Anxiety    Arthritis    Asthma    Atypical chest pain 02/04/2019   Bilateral carotid bruits 03/28/2017   Blood transfusion without reported diagnosis    Breast cancer ([place])    2002, left, encapsulated, microcalcifications. lumpectomy, radtiation x 30   Breast cancer in female [facility])    Bursitis of left hip 09/04/2018   Change in bowel habits 02/22/2018   Change in mole 06/08/2016   Chest pain 03/04/2020   CHF (congestive heart failure) (HCC)    Chronic bilateral thoracic back pain 05/23/2016   Chronic diastolic congestive heart failure ([place]) 05/28/2019   Colitis    Complication of anesthesia    VERY SENSITIVE TO ANESTHESIA    Constipation 01/17/2018   Coronary artery disease involving native coronary artery of native heart without angina pectoris 05/28/2019   Cortical age-related cataract of right eye 04/18/2018   Cough variant asthma  vs UACS from ACEi     Spirometry 12/02/2016  FEV1 1.26 (76%)  Ratio 78 with min curvature p last saba > 6 h prior  - trial off acei 12/02/2016  - 12/02/2016  After extensive coaching HFA effectiveness =    75% with qvar autohaler > rechallenge with 80 2bid     Decreased visual acuity 01/16/2017   Degeneration of lumbar intervertebral disc 08/01/2018   Diarrhea 11/16/2015   D/o Diverticuli, polyps, celiac disease.     Dyspnea 11/04/2016   with asthma exacerbation only   Dysuria 11/04/2016   Elevated sed rate 10/23/2017   Essential hypertension    Trial off acei 12/02/2016 due to cough/ pseudoasthma   GERD (gastroesophageal reflux disease)    Gluten intolerance    History of chicken pox    History of Helicobacter pylori infection 08/01/2015   History of rectal bleeding 06/01/2019   Hx of LASIK 04/24/2019   Hyperglycemia 11/07/2016   Hyperlipidemia    Hypertension    Hyponatremia 02/16/2019   Hypothyroidism    IBS (irritable bowel syndrome)     Lattice degeneration of right retina 04/24/2019   Left hand pain 05/07/2018   Left hip pain 03/01/2017   Left leg pain 03/01/2017   Left shoulder pain 05/07/2018   Low back pain 08/10/2015   LUQ pain 06/01/2019   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   Neck pain 03/01/2017   Nuclear sclerotic cataract of right eye 04/18/2018   Osteoporosis    Overweight (BMI 25.0-29.9) 02/14/2019   Pain in joint of left shoulder 08/11/2018   Paroxysmal atrial tachycardia 05/28/2019   Pelvic pain 07/02/2018   Personal history of radiation therapy    Pneumonia    PONV (postoperative nausea and vomiting)    Poor appetite 02/22/2018   Pseudophakia of left eye 04/18/2018   PVD (posterior vitreous detachment), right 04/24/2019   Restless sleeper 11/07/2016   Right hip pain 10/23/2017   RLQ discomfort 03/01/2017   RLS (restless legs syndrome) 05/07/2019   S/P left THA, AA 02/13/2019   Sepsis due to pneumonia ([place]) 02/16/2019   Sleep apnea    uses CPAP  Stomach cramps    Tremor 02/15/2016   Urinary frequency 12/22/2017   Urinary incontinence 05/07/2019   Urinary tract infection 11/04/2016   Vitamin D deficiency 04/29/2016    Tobacco History: Social History   Tobacco Use  Smoking Status Never  Smokeless Tobacco Never   Counseling given: Not Answered   Outpatient Medications Prior to Visit  Medication Sig Dispense Refill   ASPIRIN 81 PO 81 mg daily.      citalopram (CELEXA) 20 MG tablet TAKE 1 TABLET BY MOUTH AT BEDTIME 90 tablet 0   fexofenadine (ALLEGRA ALLERGY) 180 MG tablet Take 1 tablet (180 mg total) by mouth daily as needed for allergies or rhinitis.     fluticasone (FLOVENT HFA) 110 MCG/ACT inhaler Inhale 1-2 puffs into the lungs 2 (two) times daily as needed. 1 each 12   furosemide (LASIX) 40 MG tablet Take 1 tablet (40 mg total) by mouth daily. 90 tablet 1   hyoscyamine (LEVSIN SL) 0.125 MG SL tablet Place 1 tablet (0.125 mg total)  under the tongue 2 (two) times daily. 30 tablet 1   ipratropium (ATROVENT) 0.02 % nebulizer solution Take 2.5 mLs (0.5 mg total) by nebulization every 6 (six) hours as needed for wheezing or shortness of breath. 75 mL 12   levothyroxine (SYNTHROID) 50 MCG tablet TAKE 1 TABLET BY MOUTH ONCE DAILY BEFORE BREAKFAST 90 tablet 0   omega-3 acid ethyl esters (LOVAZA) 1 g capsule Take 2 g by mouth daily.     omeprazole (PRILOSEC) 40 MG capsule Take 1 capsule (40 mg total) by mouth in the morning and at bedtime. Take 1 tablet by mouth once to twice daily 60 capsule 5   ondansetron (ZOFRAN) 4 MG tablet Dissolve 1 tablet under tongue once to twice a day, every day 90 tablet 3   potassium chloride (KLOR-CON M) 10 MEQ tablet Take 1 tablet (10 mEq total) by mouth daily. 90 tablet 1   sucralfate (CARAFATE) 1 g tablet Take 1 tablet by mouth twice daily 60 tablet 0   mirabegron ER (MYRBETRIQ) 25 MG TB24 tablet Take 1 tablet (25 mg total) by mouth daily. (Patient not taking: Reported on 06/15/2022) 30 tablet 11   NON FORMULARY as needed (wheezing, SOB). Iprasynt inhaler (unable to find english equivalent med name) (Patient not taking: Reported on 06/15/2022)     No facility-administered medications prior to visit.   Review of Systems  Review of Systems  Constitutional:  Positive for fatigue.  HENT: Negative.    Respiratory: Negative.  Negative for apnea.   Cardiovascular: Negative.   Psychiatric/Behavioral:  Positive for sleep disturbance.    Physical Exam  BP 112/68 (BP Location: Left Arm, Patient Position: Sitting, Cuff Size: Normal)   Pulse 69   Ht '4\' 10"'$  (1.473 m)   Wt 116 lb 9.6 oz (52.9 kg)   SpO2 96%   BMI 24.37 kg/m  Physical Exam Constitutional:      Appearance: Normal appearance.  HENT:     Head: Normocephalic and atraumatic.     Mouth/Throat:     Mouth: Mucous membranes are moist.     Pharynx: Oropharynx is clear.  Cardiovascular:     Rate and Rhythm: Normal rate and regular rhythm.   Pulmonary:     Effort: Pulmonary effort is normal.     Breath sounds: Normal breath sounds.  Musculoskeletal:        General: Normal range of motion.  Skin:    General: Skin is warm and dry.  Neurological:  General: No focal deficit present.     Mental Status: She is alert and oriented to person, place, and time. Mental status is at baseline.  Psychiatric:        Mood and Affect: Mood normal.        Behavior: Behavior normal.        Thought Content: Thought content normal.        Judgment: Judgment normal.      Lab Results:  CBC    Component Value Date/Time   WBC 5.1 03/25/2022 1234   RBC 3.90 03/25/2022 1234   HGB 11.2 (L) 03/25/2022 1234   HGB 11.0 (L) 02/05/2019 1058   HCT 34.1 (L) 03/25/2022 1234   HCT 33.7 (L) 02/05/2019 1058   PLT 206.0 03/25/2022 1234   PLT 227 02/05/2019 1058   MCV 87.4 03/25/2022 1234   MCV 89 02/05/2019 1058   MCH 30.0 11/02/2021 1540   MCHC 32.9 03/25/2022 1234   RDW 15.2 03/25/2022 1234   RDW 14.0 02/05/2019 1058   LYMPHSABS 1.5 02/04/2022 1001   MONOABS 0.6 02/04/2022 1001   EOSABS 0.0 02/04/2022 1001   BASOSABS 0.0 02/04/2022 1001    BMET    Component Value Date/Time   NA 140 03/25/2022 1234   NA 136 (A) 10/04/2019 0000   K 4.6 03/25/2022 1234   CL 101 03/25/2022 1234   CO2 29 03/25/2022 1234   GLUCOSE 86 03/25/2022 1234   BUN 29 (H) 03/25/2022 1234   BUN 28 (A) 10/04/2019 0000   CREATININE 1.27 (H) 03/25/2022 1234   CREATININE 1.39 (H) 09/04/2021 1558   CALCIUM 9.2 03/25/2022 1234   GFRNONAA 39 (L) 11/02/2021 1540   GFRAA 57 (L) 04/03/2019 1111    BNP    Component Value Date/Time   BNP 190.5 (H) 12/23/2020 1118    ProBNP    Component Value Date/Time   PROBNP 159.0 (H) 06/23/2021 1130    Imaging: No results found.   Assessment & Plan:   Sleep apnea - Home sleep study in November 2021 showed moderate obstructive sleep apnea, average AHI 18/hour. Poor CPAP compliance over the last 90 days d/t mask fit.  We re-adjusted headgear today in office and asked her to re-try, if still having difficulty with mask fit advised she contact [facility] and set up a time to be re-fitted for CPAP mask. She is  interested in being considered for oral appliance for alternative treatment option for sleep apnea.   Referral: Mask fitting with [facility] re: oral appliance for sleep apnea   Follow-up: 6-8 weeks with Eustaquio Maize NP or sooner if needed    Martyn Ehrich, NP 06/15/2022

## 2022-06-15 NOTE — Patient Instructions (Addendum)
You have moderate sleep apnea from sleep study in 2021  Having trouble tolerating CPAP mask, we re-adjusted headgear today. I want you to re-try and if still havings difficulty with mask fit please call your DME company and set up a time to be re-fitted for CPAP mask.  [facility] = DME [phone]   In the mean time we will also refer you to orthodontics for consideration oral appliance as a treatment option for your sleep apnea instead of CPAP  Recommendations: Aim to wear CPAP every night 4-6 hours or more  Referral: [facility] fitting with [facility] re: oral appliance for sleep apnea   Follow-up: 6-8 weeks with Beth NP or sooner if needed

## 2022-06-15 NOTE — Assessment & Plan Note (Addendum)
-  Home sleep study in November 2021 showed moderate obstructive sleep apnea, average AHI 18/hour. Poor CPAP compliance over the last 90 days d/t mask fit. We re-adjusted headgear today in office and asked her to re-try, if still having difficulty with mask fit advised she contact [facility] and set up a time to be re-fitted for CPAP mask. She is  interested in being considered for oral appliance for alternative treatment option for sleep apnea.   Referral: Mask fitting with [facility] re: oral appliance for sleep apnea   Follow-up: 6-8 weeks with Beth NP or sooner if needed

## 2022-06-21 ENCOUNTER — Encounter: Payer: Self-pay | Admitting: Obstetrics and Gynecology

## 2022-06-21 ENCOUNTER — Ambulatory Visit: Payer: Medicare HMO | Admitting: Obstetrics and Gynecology

## 2022-06-21 ENCOUNTER — Other Ambulatory Visit: Payer: Self-pay | Admitting: Family Medicine

## 2022-06-21 VITALS — BP 125/60 | HR 63

## 2022-06-21 DIAGNOSIS — N3281 Overactive bladder: Secondary | ICD-10-CM | POA: Diagnosis not present

## 2022-06-21 DIAGNOSIS — R351 Nocturia: Secondary | ICD-10-CM | POA: Diagnosis not present

## 2022-06-21 DIAGNOSIS — Z1231 Encounter for screening mammogram for malignant neoplasm of breast: Secondary | ICD-10-CM

## 2022-06-21 DIAGNOSIS — R350 Frequency of micturition: Secondary | ICD-10-CM

## 2022-06-21 NOTE — Progress Notes (Signed)
[facility]  PTNS VISIT  CC:  Overactive bladder  82 y.o. with refractory overactive bladder who presents for percutaneous tibial nerve stimulation. The patient presents for PTNS session # 4.  She reports she has had some improvement with urgency and has had less accidents. We discussed that this was encouraging and that we will continue the PTNS.   Procedure: The patient was placed in the sitting position and the left lower extremity was prepped in the usual fashion. The PTNS needle was then inserted at a 60 degree angle, 5 cm cephalad and 2 cm posterior to the medial malleolus. The PTNS unit was then programmed and an optimal response was noted at 2 milliamps. The PTNS stimulation was then performed at this setting for 30 minutes without incident and the patient tolerated the procedure well. The needle was removed and hemostasis was noted.   The pt will return in 1 week for PTNS session # 5. All questions were answered.

## 2022-06-22 DIAGNOSIS — H532 Diplopia: Secondary | ICD-10-CM | POA: Diagnosis not present

## 2022-06-22 DIAGNOSIS — H5231 Anisometropia: Secondary | ICD-10-CM | POA: Diagnosis not present

## 2022-06-22 DIAGNOSIS — H0288B Meibomian gland dysfunction left eye, upper and lower eyelids: Secondary | ICD-10-CM | POA: Diagnosis not present

## 2022-06-22 DIAGNOSIS — H04123 Dry eye syndrome of bilateral lacrimal glands: Secondary | ICD-10-CM | POA: Diagnosis not present

## 2022-06-22 DIAGNOSIS — H0288A Meibomian gland dysfunction right eye, upper and lower eyelids: Secondary | ICD-10-CM | POA: Diagnosis not present

## 2022-06-28 ENCOUNTER — Other Ambulatory Visit: Payer: Self-pay | Admitting: Gastroenterology

## 2022-06-28 ENCOUNTER — Other Ambulatory Visit: Payer: Self-pay | Admitting: Medical

## 2022-06-28 ENCOUNTER — Other Ambulatory Visit: Payer: Self-pay | Admitting: Family Medicine

## 2022-06-28 ENCOUNTER — Other Ambulatory Visit: Payer: Self-pay | Admitting: Pulmonary Disease

## 2022-06-28 ENCOUNTER — Telehealth: Payer: Self-pay

## 2022-06-28 ENCOUNTER — Ambulatory Visit: Payer: Medicare HMO | Admitting: Obstetrics and Gynecology

## 2022-06-28 DIAGNOSIS — J4520 Mild intermittent asthma, uncomplicated: Secondary | ICD-10-CM

## 2022-06-28 NOTE — Telephone Encounter (Signed)
Samples placed up front for pt pickup. Pt notified and states she will pick them up this week.

## 2022-06-28 NOTE — Progress Notes (Deleted)
[facility]  PTNS VISIT  CC:  Overactive bladder  82 y.o. with refractory overactive bladder who presents for percutaneous tibial nerve stimulation. The patient presents for PTNS session # 5.   Procedure: The patient was placed in the sitting position and the {left/right:[id number]} lower extremity was prepped in the usual fashion. The PTNS needle was then inserted at a 60 degree angle, 5 cm cephalad and 2 cm posterior to the medial malleolus. The PTNS unit was then programmed and an optimal response was noted at *** milliamps. The PTNS stimulation was then performed at this setting for 30 minutes without incident and the patient tolerated the procedure well. The needle was removed and hemostasis was noted.   The pt will return in 1 week for PTNS session # 6. All questions were answered.

## 2022-06-28 NOTE — Progress Notes (Unsigned)
[facility]  PTNS VISIT  CC:  Overactive bladder  82 y.o. with refractory overactive bladder who presents for percutaneous tibial nerve stimulation. The patient presents for PTNS session # 5.   Procedure: The patient was placed in the sitting position and the left lower extremity was prepped in the usual fashion. The PTNS needle was then inserted at a 60 degree angle, 5 cm cephalad and 2 cm posterior to the medial malleolus. The PTNS unit was then programmed and an optimal response was noted at 5 milliamps. The PTNS stimulation was then performed at this setting for 30 minutes without incident and the patient tolerated the procedure well. The needle was removed and hemostasis was noted.   The pt will return in 1 week for PTNS session # 6. All questions were answered.

## 2022-06-28 NOTE — Telephone Encounter (Signed)
Pt could not recall when her symptoms started however she states she had been experiencing a headache all along while taking the Myrbetriq, but she did not know what it was from till she stopped the medication. Pt states her daughter is a PA and she suggested pt stop taking the medication to see if symptoms resolve. She would like to try another medication.

## 2022-06-28 NOTE — Telephone Encounter (Signed)
-----   Message from Primus Bravo, MD sent at 06/28/2022 11:38 AM EST ----- This patient sent me a message about 2 weeks ago reporting possible side effects from Myrbetriq. I responded with a MyChart message but apparently she has not read it or responded. Please find out when the symptoms started.  She was not reporting any side effects from the medication at her last visit after taking Myrbetriq for a month. Does she still want to try a different medication?

## 2022-06-29 ENCOUNTER — Ambulatory Visit: Payer: Medicare HMO | Admitting: Obstetrics and Gynecology

## 2022-06-29 ENCOUNTER — Encounter: Payer: Self-pay | Admitting: Obstetrics and Gynecology

## 2022-06-29 VITALS — BP 105/70 | HR 69

## 2022-06-29 DIAGNOSIS — R351 Nocturia: Secondary | ICD-10-CM

## 2022-06-29 DIAGNOSIS — R350 Frequency of micturition: Secondary | ICD-10-CM | POA: Diagnosis not present

## 2022-06-29 DIAGNOSIS — N3281 Overactive bladder: Secondary | ICD-10-CM | POA: Diagnosis not present

## 2022-06-30 ENCOUNTER — Encounter: Payer: Self-pay | Admitting: Gastroenterology

## 2022-06-30 ENCOUNTER — Ambulatory Visit: Payer: Medicare HMO | Admitting: Gastroenterology

## 2022-06-30 VITALS — BP 118/72 | HR 61 | Ht <= 58 in | Wt 116.0 lb

## 2022-06-30 DIAGNOSIS — K5989 Other specified functional intestinal disorders: Secondary | ICD-10-CM | POA: Diagnosis not present

## 2022-06-30 DIAGNOSIS — K599 Functional intestinal disorder, unspecified: Secondary | ICD-10-CM

## 2022-06-30 DIAGNOSIS — R32 Unspecified urinary incontinence: Secondary | ICD-10-CM | POA: Diagnosis not present

## 2022-06-30 MED ORDER — IBGARD 90 MG PO CPCR: ORAL_CAPSULE | ORAL | Status: AC

## 2022-06-30 MED ORDER — BENEFIBER PO POWD: ORAL | 0 refills | Status: DC

## 2022-06-30 MED ORDER — HYOSCYAMINE SULFATE 0.125 MG SL SUBL: SUBLINGUAL_TABLET | SUBLINGUAL | 1 refills | Status: DC

## 2022-06-30 NOTE — Progress Notes (Signed)
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  Recall I have seen her numerous times for multiple abdominal symptoms.  She also has a history of GERD, hiatal hernia, dyspepsia.  She has had significant workup with endoscopy, colonoscopy, multiple imaging studies with CT scans in recent years.  Please see prior notes for details of her case.  I last saw her about a year ago.  We discussed some options.  I performed a pancreatic fecal elastase which was normal.  I gave her a free sample of rifaximin for suspected IBS and bloating, she took it but cannot remember if it helped or not.  For her pain we decided to switch her SSRI to Cymbalta to see if this would help her visceral hypersensitivity.  From looking through the chart it sounds like this initially did provide some benefit for her however she developed some dizziness on the Cymbalta, unclear if it was related but it was stopped and she was switched back to Celexa.  She reports today that she continues to have abdominal discomfort that can bother her in her upper abdomen, and bilateral in the lower abdomen.  She has fluctuating stool frequency, sometimes loose sometimes not.  She typically has a few bowel movements per day.  She rarely will have some fecal leakage if her stools are loose.  She also has urinary incontinence has been seen by pelvic floor physical therapy for these issues and she is actively engaged with them.  She had COVID in September which took her some time to get over.  She states she takes Levsin as needed for pain and this does help her pain.  She is also been using IBgard with her meals and she states this helps as well.  She is happy with both of those but does not use the Levsin too frequently.  She has Benefiber at home but does not take this routinely.  She has a lot of discomfort around the time of her  bowel movements which is then relieved with a bowel movement.  The symptoms have been ongoing for years.     Prior work-up: CT scan 10/24/18 - spinal stenosis lumbar spine   CT scan 12/23/2017 - diverticulosis, no cause for pain  CT scan 07/19/2017 - no cause for pain noted   Colonoscopy 08/05/2015 - AVM in the cecum noted, ablated with APC. Ileum normal. No polyps or evidence of colitis. Biopsies not taken.  EGD 08/05/2015 - 5cm HH, mild gastritis on path, mild esophagitis with esophagus with normal path, normal duodenum on path   CT scan 06/06/2019: IMPRESSION: 1. No acute findings in the abdomen or pelvis. 2. Moderate hiatal hernia with 25-50% of the stomach contained in the chest. 3. Left colonic diverticulosis without diverticulitis. 4.  Aortic Atherosclerois (ICD10-170.0)   GES 12/07/18 - normal   Colonoscopy 08/14/19 - - The examined portion of the ileum was normal. - One diminutive polyp in the cecum, removed with a cold biopsy forceps. Resected and retrieved. - Two diminutive polyps in the transverse colon, removed with a cold biopsy forceps. Resected and retrieved. - One 5 mm polyp at the splenic flexure, removed with a cold snare. Resected and retrieved. - One 6 mm polyp in the descending colon, removed with a cold snare. Resected and retrieved. - Diverticulosis in the sigmoid colon. - Internal hemorrhoids. - The examination was otherwise normal. - Biopsies were taken with  a cold forceps from the right colon, left colon and transverse colon for evaluation of microscopic colitis.   Path negative for MC     EGD 11/28/19 -  - A 5 cm hiatal hernia was present. No obvious cameron lesions noted. - The exam of the esophagus was otherwise normal. - Patchy mildly erythematous mucosa was found in the gastric antrum. - The exam of the stomach was otherwise normal. No ulcers - Biopsies were taken with a cold forceps in the gastric body, at the incisura and in the gastric antrum for  Helicobacter pylori testing. - The duodenal bulb and second portion of the duodenum were normal. Biopsies for histology were taken with a cold forceps for evaluation of celiac disease.   1. Surgical [P], duodenal bulb, 2nd portion of duodenum and distal duodenum - BENIGN DUODENAL MUCOSA. - NO FEATURES OF CELIAC SPRUE OR GRANULOMAS. 2. Surgical [P], gastric antrum and gastric body - ANTRAL AND OXYNTIC MUCOSA WITH HYPEREMIA. - WARTHIN-STARRY NEGATIVE FOR HELICOBACTER PYLORI. - NO INTESTINAL METAPLASIA, DYSPLASIA OR CARCINOMA.     CT angio chest / abdomen / pelvis 03/04/20 - IMPRESSION: 1. Normal contour and caliber of the thoracic and abdominal aorta. No evidence of aneurysm, dissection, or other acute aortic pathology. Scattered atherosclerosis. 2. No acute findings in the chest, abdomen or pelvis to explain pain. 3. Small hiatal hernia. 4. Sigmoid diverticulosis without evidence of diverticulitis. 5. Status post cholecystectomy and hysterectomy     CT abdomen / pelvis with contrast 09/23/20 -  IMPRESSION: Colonic diverticulosis. No radiographic evidence of diverticulitis or other acute findings. Small hiatal hernia.   CT chest angio 12/23/20: IMPRESSION: Negative for significant acute pulmonary embolus by CTA.  No other acute intrathoracic vascular finding. Diffuse bilateral patchy ground-glass opacities, nonspecific. See above comment. Moderate hiatal hernia   FOBT negative 09/23/20     EGD 02/11/21 -  - A 5 cm hiatal hernia was present. - The exam of the esophagus was otherwise normal. - The entire examined stomach was normal. Biopsies were taken with a cold forceps for Helicobacter pylori testing. - The duodenal bulb and second portion of the duodenum were normal.   Surgical [P], gastric antrum and gastric body - BENIGN GASTRIC MUCOSA. [name] IS NEGATIVE FOR HELICOBACTER PYLORI. - NO INTESTINAL METAPLASIA, DYSPLASIA, OR MALIGNANCY.   CT scan  06/10/2021: IMPRESSION: 1. No acute process identified. 2. Small to moderate sized hiatal hernia. 3. Mild colonic diverticulosis. Retained fecal material throughout the colon    CT chest 08/13/21: IMPRESSION: 1. Predominantly bandlike, bland appearing post infectious or inflammatory scarring of the bilateral lung bases. No evidence of fibrotic interstitial lung disease. 2. Lobular air trapping on expiratory phase imaging, consistent with small airways disease. This mosaic attenuation likely explains ground-glass opacity reported on prior CT angiogram, performed in expiratory phase. 3. Coronary artery disease. 4. Moderate hiatal hernia.   Cardiac CT 11/26/21: IMPRESSION: Moderate size hiatal hernia. Otherwise no signs of significant or acute extracardiac findings.  IMPRESSION: 1. Minimal mixed CAD without stenosis, CADRADS = 0.   2. Coronary calcium score of 2.52. This was 14th percentile for age and sex matched control.   3. Normal coronary origin with right dominance.   4. Aortic atherosclerosis.   5. Consider non-coronary causes of chest pain.    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 lumpectomy, radtiation x 30   Breast cancer in female [facility])    Bursitis of left hip 09/04/2018   Change in bowel habits 02/22/2018   Change in mole 06/08/2016   Chest pain 03/04/2020   CHF (congestive heart failure) ([facility])    Chronic bilateral thoracic  back pain 05/23/2016   Chronic diastolic congestive heart failure ([place]) 05/28/2019   Colitis    Complication of anesthesia    VERY SENSITIVE TO ANESTHESIA    Constipation 01/17/2018   Coronary artery disease involving native coronary artery of native heart without angina pectoris 05/28/2019   Cortical age-related cataract of right eye 04/18/2018   Cough variant asthma  vs UACS from ACEi     Spirometry 12/02/2016  FEV1 1.26 (76%)  Ratio 78 with min curvature p last saba > 6 h prior  - trial off acei 12/02/2016  - 12/02/2016  After extensive coaching HFA effectiveness =    75% with qvar autohaler > rechallenge with 80 2bid     Decreased visual acuity 01/16/2017   Degeneration of lumbar intervertebral disc 08/01/2018   Diarrhea 11/16/2015   D/o Diverticuli, polyps, celiac disease.     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   Left hand pain 05/07/2018   Left hip pain 03/01/2017   Left leg pain 03/01/2017   Left shoulder pain 05/07/2018   Low back pain 08/10/2015   LUQ pain 06/01/2019   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   Neck pain 03/01/2017   Nuclear sclerotic cataract of right eye 04/18/2018   Osteoporosis    Overweight (BMI 25.0-29.9) 02/14/2019   Pain in joint of left shoulder 08/11/2018   Paroxysmal atrial tachycardia 05/28/2019   Pelvic pain 07/02/2018   Personal history of radiation therapy     Pneumonia    PONV (postoperative nausea and vomiting)    Poor appetite 02/22/2018   Pseudophakia of left eye 04/18/2018   PVD (posterior vitreous detachment), right 04/24/2019   Restless sleeper 11/07/2016   Right hip pain 10/23/2017   RLQ discomfort 03/01/2017   RLS (restless legs syndrome) 05/07/2019   S/P left THA, AA 02/13/2019   Sepsis due to pneumonia ([place]) 02/16/2019   Sleep apnea    uses CPAP   Stomach cramps    Tremor 02/15/2016   Urinary frequency 12/22/2017   Urinary incontinence 05/07/2019   Urinary tract infection 11/04/2016   Vitamin D deficiency 04/29/2016     Past Surgical History:  Procedure Laterality Date  ABDOMINAL HYSTERECTOMY     partial   APPENDECTOMY     BREAST LUMPECTOMY Left 2002   CHOLECYSTECTOMY  Age 37 or 20   COLONOSCOPY  2017   EYE SURGERY Left    cataract   LEFT HEART CATH AND CORONARY ANGIOGRAPHY N/A 02/09/2019   Procedure: LEFT HEART CATH AND CORONARY ANGIOGRAPHY;  Surgeon: Nelva Bush, MD;  Location: [facility];  Service: Cardiovascular;  Laterality: N/A;   TONSILLECTOMY     TOTAL HIP ARTHROPLASTY Left 02/13/2019   Procedure: TOTAL HIP ARTHROPLASTY ANTERIOR APPROACH;  Surgeon: Paralee Cancel, MD;  Location: [facility];  Service: Orthopedics;  Laterality: Left;  70 mins   UPPER GASTROINTESTINAL ENDOSCOPY     Family History  Problem Relation Age of Onset   Colitis Mother    Irritable bowel syndrome Mother    Hypertension Father    Hyperlipidemia Sister    Hyperlipidemia Brother    Heart attack Brother    Hyperlipidemia Brother    Heart attack Brother    Hyperlipidemia Brother    Heart attack Brother    Stomach cancer Paternal Grandmother 30   Colon cancer Paternal Grandmother    Leukemia Daughter    Arthritis Daughter    Heart disease Daughter        ASD vs VSD   Esophageal cancer Neg Hx    Pancreatic cancer Neg Hx    Rectal cancer Neg Hx    Social History   Tobacco Use   Smoking status: Never   Smokeless  tobacco: Never  Vaping Use   Vaping Use: Never used  Substance Use Topics   Alcohol use: Never    Alcohol/week: 0.0 standard drinks of alcohol   Drug use: Never   Current Outpatient Medications  Medication Sig Dispense Refill   ASPIRIN 81 PO 81 mg daily.      citalopram (CELEXA) 20 MG tablet TAKE 1 TABLET BY MOUTH AT BEDTIME 90 tablet 0   fexofenadine (ALLEGRA ALLERGY) 180 MG tablet Take 1 tablet (180 mg total) by mouth daily as needed for allergies or rhinitis.     FLOVENT HFA 110 MCG/ACT inhaler INHALE 1 TO 2 PUFFS BY MOUTH TWICE DAILY AS NEEDED 12 g 0   furosemide (LASIX) 40 MG tablet Take 1 tablet (40 mg total) by mouth daily. 90 tablet 1   hyoscyamine (LEVSIN SL) 0.125 MG SL tablet DISSOLVE 1 TABLET IN MOUTH UNDER THE TONGUE TWICE DAILY 30 tablet 0   ipratropium (ATROVENT) 0.02 % nebulizer solution Take 2.5 mLs (0.5 mg total) by nebulization every 6 (six) hours as needed for wheezing or shortness of breath. 75 mL 12   levothyroxine (SYNTHROID) 50 MCG tablet Take 1 tablet (50 mcg total) by mouth daily before breakfast. 90 tablet 1   omega-3 acid ethyl esters (LOVAZA) 1 g capsule Take 2 g by mouth daily.     omeprazole (PRILOSEC) 40 MG capsule Take 1 capsule (40 mg total) by mouth 2 (two) times daily. Please schedule a yearly follow up for further refills. Thank you 60 capsule 1   ondansetron (ZOFRAN) 4 MG tablet Dissolve 1 tablet under tongue once to twice a day, every day 90 tablet 3   potassium chloride (KLOR-CON M) 10 MEQ tablet Take 1 tablet (10 mEq total) by mouth daily. 90 tablet 1   sucralfate (CARAFATE) 1 g tablet Take 1 tablet by mouth twice daily 60 tablet 0   No current facility-administered medications for this visit.   Allergies  Allergen Reactions  Valium [Diazepam] Shortness Of Breath    Sob and bradycardia   Albuterol And Levalbuterol Palpitations    Episode of palpitations, tremor and anxiety when administered levalbuterol for PFT. Recommend avoiding this  medicaiton class     Review of Systems: All systems reviewed and negative except where noted in HPI.   Lab Results  Component Value Date   WBC 5.1 03/25/2022   HGB 11.2 (L) 03/25/2022   HCT 34.1 (L) 03/25/2022   MCV 87.4 03/25/2022   PLT 206.0 03/25/2022    Lab Results  Component Value Date   CREATININE 1.27 (H) 03/25/2022   BUN 29 (H) 03/25/2022   NA 140 03/25/2022   K 4.6 03/25/2022   CL 101 03/25/2022   CO2 29 03/25/2022    Lab Results  Component Value Date   ALT 14 03/25/2022   AST 21 03/25/2022   ALKPHOS 71 03/25/2022   BILITOT 0.5 03/25/2022     Physical Exam: BP 118/72   Pulse 61   Ht '4\' 10"'$  (1.473 m)   Wt 116 lb (52.6 kg)   BMI 24.24 kg/m  Constitutional: Pleasant,well-developed, female in no acute distress. Neurological: Alert and oriented to person place and time. Skin: Skin is warm and dry. No rashes noted. Psychiatric: Normal mood and affect. Behavior is normal.   ASSESSMENT: 82 y.o. female here for assessment of the following  1. Functional bowel disorder   2. Visceral hypersensitivity syndrome   3. Urinary incontinence, unspecified type    Extensive workup with numerous imaging studies, endoscopy, colonoscopy.  Symptoms are chronic.  She very likely has functional bowel disorders/IBS along with visceral hypersensitivity.  Pain modulator I think would be her best option for long-term management.  Unfortunately it appears that Cymbalta use was associated with some dizziness although she did endorse some benefit with her abdomen with this.  IBgard is helping her as well as Levsin, she does not use the Levsin too frequently so I think we can up the dose on that.  I do think daily fiber supplement would help keep her stools bulked and regular and hopefully that would help fecal leakage.  She has been following with pelvic floor PT which she thinks has been helping with that issue as well as her urinary incontinence.  Moving forward, I asked her to  take her Levsin at least twice daily to see if this will help reduce her frequency of symptoms.  I will need to reach out to her primary care, Dr. Randel Pigg to discuss her candidacy for potentially reintroducing Cymbalta at a lower dose versus consideration of a TCA.  I think Elavil will be a good option for her bowels and her pain, however concerned about drowsiness and fall risk in this elderly patient they would need to discuss its use with her primary care.  I will get back to her after this conversation.  She agrees with the plan.  I tried her on rifaximin in the past, unclear if any benefit with that, would hold off on repeat use given cost.  PLAN: - use benefiber daily - take levsin at least twice daily to reduce frequency / symptoms - continue scheduled IB gard daily - continue to see pelvic floor PT - reportedly had some dizziness with Cymbalta at '30mg'$ ? Consider lower dose Cymbalta or trial of very low dose Elavil but concerned about tolerance - will discuss with Dr. Buelah Manis, MD [facility]

## 2022-06-30 NOTE — Patient Instructions (Addendum)
If you are age 81 or older, your body mass index should be between 23-30. Your Body mass index is 24.24 kg/m. If this is out of the aforementioned range listed, please consider follow up with your Primary Care Provider.  If you are age 88 or younger, your body mass index should be between 19-25. Your Body mass index is 24.24 kg/m. If this is out of the aformentioned range listed, please consider follow up with your Primary Care Provider.   ________________________________________________________  Take Benefiber DAILY.  Take Levsin at least twice a day.  Continue IBGard.  Thank you for entrusting me with your care and for choosing [facility], Dr. Henning Cellar

## 2022-07-01 ENCOUNTER — Telehealth: Payer: Self-pay | Admitting: Gastroenterology

## 2022-07-01 NOTE — Telephone Encounter (Signed)
Called and spoke with patient regarding your recommendations. I had a hard time understanding patient, but she is concerned that something else is going on with her stomach. Pt states that she is not sure why no one looked at orr assessed her stomach, she is very surprised at this. Pt states that she does not want to have something going on that could kill her. Pt has an appt with Dr. Randel Pigg tomorrow and she will have her perform an assessment and see what she recommends. Pt declined the prescription for Elavil that was recommended.

## 2022-07-01 NOTE — Telephone Encounter (Signed)
I am surprised she has said this, I had a nice visit with her yesterday.  She has had 4 CT scans, 2 endoscopies, and a colonoscopy within the past few years.  She is status post cholecystectomy.  I really think this medication would help her, I do not think were missing anything concerning, she has these symptoms chronically.  Can you reiterate the workup she has had done so far so she understands this, although I tried to reassure her yesterday when we discussed it.  Okay if she wants to discuss with Dr. Randel Pigg first.  Thanks

## 2022-07-01 NOTE — Telephone Encounter (Signed)
I touched base with Dr. Randel Pigg about this mutual patient, starting low dose Cymbalta or low dose Elavil and see how she does, she is okay with this.   [place] can you let the patient know we spoke. Would like to add Elavil '10mg'$  at night prior to bed to see if this will help slow her bowel and treat her stomach pains. Main side effect would be drowsiness but hopefully it will just help her sleep if she uses it at night. If she has intolerance she should let us know. Can you order her a 30 day supply to start to see how she does? She can touch base with me in 30 days for an update to determine if we continue it or not. I really think it may help her stomach.  Thanks

## 2022-07-02 ENCOUNTER — Ambulatory Visit (INDEPENDENT_AMBULATORY_CARE_PROVIDER_SITE_OTHER): Payer: Medicare HMO | Admitting: Medical

## 2022-07-02 ENCOUNTER — Ambulatory Visit: Payer: Medicare HMO | Admitting: Family Medicine

## 2022-07-02 VITALS — BP 130/64 | HR 69 | Temp 97.6°F | Resp 18 | Ht <= 58 in | Wt 114.0 lb

## 2022-07-02 DIAGNOSIS — R300 Dysuria: Secondary | ICD-10-CM

## 2022-07-02 DIAGNOSIS — R197 Diarrhea, unspecified: Secondary | ICD-10-CM

## 2022-07-02 DIAGNOSIS — R350 Frequency of micturition: Secondary | ICD-10-CM

## 2022-07-02 DIAGNOSIS — R109 Unspecified abdominal pain: Secondary | ICD-10-CM

## 2022-07-02 DIAGNOSIS — R1032 Left lower quadrant pain: Secondary | ICD-10-CM

## 2022-07-02 LAB — COMPREHENSIVE METABOLIC PANEL
ALT: 12 U/L (ref 0–35)
AST: 19 U/L (ref 0–37)
Albumin: 4.1 g/dL (ref 3.5–5.2)
Alkaline Phosphatase: 89 U/L (ref 39–117)
BUN: 34 mg/dL — ABNORMAL HIGH (ref 6–23)
CO2: 28 mEq/L (ref 19–32)
Calcium: 8.8 mg/dL (ref 8.4–10.5)
Chloride: 101 mEq/L (ref 96–112)
Creatinine, Ser: 1.25 mg/dL — ABNORMAL HIGH (ref 0.40–1.20)
GFR: 40.68 mL/min — ABNORMAL LOW (ref >60.00)
Glucose, Bld: 99 mg/dL (ref 70–99)
Potassium: 4.1 mEq/L (ref 3.5–5.1)
Sodium: 139 mEq/L (ref 135–145)
Total Bilirubin: 0.5 mg/dL (ref 0.2–1.2)
Total Protein: 6.9 g/dL (ref 6.0–8.3)

## 2022-07-02 LAB — POCT URINALYSIS DIPSTICK
Bilirubin, UA: NEGATIVE
Blood, UA: NEGATIVE
Glucose, UA: NEGATIVE
Ketones, UA: NEGATIVE
Leukocytes, UA: NEGATIVE
Nitrite, UA: NEGATIVE
Protein, UA: NEGATIVE
Spec Grav, UA: 1.01 (ref 1.010–1.025)
Urobilinogen, UA: 0.2 E.U./dL
pH, UA: 5 (ref 5.0–8.0)

## 2022-07-02 LAB — CBC WITH DIFFERENTIAL/PLATELET
Basophils Absolute: 0 10*3/uL (ref 0.0–0.1)
Basophils Relative: 0.9 % (ref 0.0–3.0)
Eosinophils Absolute: 0 10*3/uL (ref 0.0–0.7)
Eosinophils Relative: 0 % (ref 0.0–5.0)
HCT: 33.1 % — ABNORMAL LOW (ref 36.0–46.0)
Hemoglobin: 11 g/dL — ABNORMAL LOW (ref 12.0–15.0)
Lymphocytes Relative: 36.1 % (ref 12.0–46.0)
Lymphs Abs: 1.7 10*3/uL (ref 0.7–4.0)
MCHC: 33.1 g/dL (ref 30.0–36.0)
MCV: 86.4 fl (ref 78.0–100.0)
Monocytes Absolute: 0.5 10*3/uL (ref 0.1–1.0)
Monocytes Relative: 10.4 % (ref 3.0–12.0)
Neutro Abs: 2.5 10*3/uL (ref 1.4–7.7)
Neutrophils Relative %: 52.6 % (ref 43.0–77.0)
Platelets: 228 10*3/uL (ref 150.0–400.0)
RBC: 3.83 Mil/uL — ABNORMAL LOW (ref 3.87–5.11)
RDW: 14.9 % (ref 11.5–15.5)
WBC: 4.7 10*3/uL (ref 4.0–10.5)

## 2022-07-02 MED ORDER — CIPROFLOXACIN HCL 500 MG PO TABS: 500.0000 mg | ORAL_TABLET | Freq: Two times a day (BID) | ORAL | 0 refills | Status: DC

## 2022-07-02 MED ORDER — METRONIDAZOLE 500 MG PO TABS: 500.0000 mg | ORAL_TABLET | Freq: Two times a day (BID) | ORAL | 0 refills | Status: AC

## 2022-07-02 NOTE — Addendum Note (Signed)
Addended by: Kem Boroughs D on: 07/02/2022 12:16 PM   Modules accepted: Orders

## 2022-07-02 NOTE — Patient Instructions (Addendum)
Recent onset of frequent urination and dysuria for 3 days.  In addition some left CVA tenderness to palpation and left lower quadrant pain as well as suprapubic pressure.  Approximate weekend and prescribing Cipro 500 mg twice daily for 7 days.  Urine culture being sent out.  In light of your chronic abdomen symptoms and known diverticulosis in sigmoid colon decided to also prescribe Flagyl 500 mg twice daily.  Your urinalysis today did not show blood.  Will get CBC, CMP stat.  Urine culture is pending.  Hydrate well and eat bland foods.  If signs and symptoms worsen or change despite above measures then advise emergency department evaluation.  Follow-up date to be determined after lab review as well as based on how you are feeling on Monday.  Would asked that Monday morning that you send a MyChart update.   You did also mention diarrhea/loose stools over the last week.  I did place stool panel studies to be done.  If you could collect those today and return before and then start antibiotics that would be ideal.  However if you are not able to turn in stool studies then go ahead and start antibiotic today.

## 2022-07-02 NOTE — Progress Notes (Signed)
Subjective:    Patient ID: Julie Wilkerson, female    DOB: 1940/12/09, 82 y.o.   MRN: [id number]  HPI Pt points to left lower quadrant and suprapubic region pain.    Pt just saw GI MD who told her she probably had ibs. Pt states GI Md and Dr. Charlett Blake had coversation. He offered a medication but she had concern for side effects.   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  Recall I have seen her numerous times for multiple abdominal symptoms.  She also has a history of GERD, hiatal hernia, dyspepsia.   She has had significant workup with endoscopy, colonoscopy, multiple imaging studies with CT scans in recent years.  Please see prior notes for details of her case.   I last saw her about a year ago.  We discussed some options.  I performed a pancreatic fecal elastase which was normal.  I gave her a free sample of rifaximin for suspected IBS and bloating, she took it but cannot remember if it helped or not.  For her pain we decided to switch her SSRI to Cymbalta to see if this would help her visceral hypersensitivity.  From looking through the chart it sounds like this initially did provide some benefit for her however she developed some dizziness on the Cymbalta, unclear if it was related but it was stopped and she was switched back to Celexa.   She reports today that she continues to have abdominal discomfort that can bother her in her upper abdomen, and bilateral in the lower abdomen.  She has fluctuating stool frequency, sometimes loose sometimes not.  She typically has a few bowel movements per day.  She rarely will have some fecal leakage if her stools are loose.  She also has urinary incontinence has been seen by pelvic floor physical therapy for these issues and she is actively engaged with them.  She had COVID in September which took her some time to get over.   She states she  takes Levsin as needed for pain and this does help her pain.  She is also been using IBgard with her meals and she states this helps as well.  She is happy with both of those but does not use the Levsin too frequently.  She has Benefiber at home but does not take this routinely.  She has a lot of discomfort around the time of her bowel movements which is then relieved with a bowel movement.  The symptoms have been ongoing for years."  CT scan 06/06/2019: IMPRESSION: 1. No acute findings in the abdomen or pelvis. 2. Moderate hiatal hernia with 25-50% of the stomach contained in the chest. 3. Left colonic diverticulosis without diverticulitis. 4.  Aortic Atherosclerois (ICD10-170.0)   GES 12/07/18 - normal   Colonoscopy 08/14/19 - - The examined portion of the ileum was normal. - One diminutive polyp in the cecum, removed with a cold biopsy forceps. Resected and retrieved. - Two diminutive polyps in the transverse colon, removed with a cold biopsy forceps. Resected and retrieved. - One 5 mm polyp at the splenic flexure, removed with a cold snare. Resected and retrieved. - One 6 mm polyp in the descending colon, removed with a cold snare. Resected and retrieved. - Diverticulosis in the sigmoid colon. - Internal hemorrhoids. - The examination was otherwise normal. - Biopsies were taken with a cold forceps from the right colon, left colon and  transverse colon for evaluation of microscopic colitis.   CT angio chest / abdomen / pelvis 03/04/20 - IMPRESSION: 1. Normal contour and caliber of the thoracic and abdominal aorta. No evidence of aneurysm, dissection, or other acute aortic pathology. Scattered atherosclerosis. 2. No acute findings in the chest, abdomen or pelvis to explain pain. 3. Small hiatal hernia. 4. Sigmoid diverticulosis without evidence of diverticulitis. 5. Status post cholecystectomy and hysterectomy    Pt states 3 days ago got painful urination and urinating more  frequently. Pt states loose stools over past week. 2-3 times a day.    Review of Systems  Constitutional:  Negative for chills, fatigue and fever.  Respiratory:  Negative for cough, chest tightness, shortness of breath and wheezing.   Cardiovascular:  Negative for chest pain and palpitations.  Gastrointestinal:  Positive for diarrhea. Negative for anal bleeding, nausea, rectal pain and vomiting.  Genitourinary:  Positive for dysuria, frequency and urgency.  Neurological:  Negative for dizziness, numbness and headaches.  Hematological:  Negative for adenopathy. Does not bruise/bleed easily.  Psychiatric/Behavioral:  Negative for behavioral problems and confusion.    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 lumpectomy, radtiation x 30   Breast cancer in female [facility])    Bursitis of left hip 09/04/2018   Change in bowel habits 02/22/2018   Change in mole 06/08/2016   Chest pain 03/04/2020   CHF (congestive heart failure) ([facility])    Chronic bilateral thoracic back pain 05/23/2016   Chronic diastolic congestive heart failure ([facility]) 05/28/2019   Colitis    Complication of anesthesia    VERY SENSITIVE TO ANESTHESIA    Constipation 01/17/2018   Coronary artery disease involving native coronary artery of native heart without angina pectoris 05/28/2019   Cortical age-related cataract of right eye 04/18/2018   Cough variant asthma  vs UACS from ACEi     Spirometry  12/02/2016  FEV1 1.26 (76%)  Ratio 78 with min curvature p last saba > 6 h prior  - trial off acei 12/02/2016  - 12/02/2016  After extensive coaching HFA effectiveness =    75% with qvar autohaler > rechallenge with 80 2bid     Decreased visual acuity 01/16/2017   Degeneration of lumbar intervertebral disc 08/01/2018   Diarrhea 11/16/2015   D/o Diverticuli, polyps, celiac disease.     Dyspnea 11/04/2016   with asthma exacerbation only   Dysuria 11/04/2016   Elevated sed rate 10/23/2017   Essential hypertension    Trial off acei 12/02/2016 due to cough/ pseudoasthma   GERD (gastroesophageal reflux disease)    Gluten intolerance    History of chicken pox    History of Helicobacter pylori infection 08/01/2015   History of rectal bleeding 06/01/2019   Hx of LASIK 04/24/2019   Hyperglycemia 11/07/2016   Hyperlipidemia    Hypertension    Hyponatremia 02/16/2019   Hypothyroidism    IBS (irritable bowel syndrome)  Lattice degeneration of right retina 04/24/2019   Left hand pain 05/07/2018   Left hip pain 03/01/2017   Left leg pain 03/01/2017   Left shoulder pain 05/07/2018   Low back pain 08/10/2015   LUQ pain 06/01/2019   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   Neck pain 03/01/2017   Nuclear sclerotic cataract of right eye 04/18/2018   Osteoporosis    Overweight (BMI 25.0-29.9) 02/14/2019   Pain in joint of left shoulder 08/11/2018   Paroxysmal atrial tachycardia 05/28/2019   Pelvic pain 07/02/2018   Personal history of radiation therapy    Pneumonia    PONV (postoperative nausea and vomiting)    Poor appetite 02/22/2018   Pseudophakia of left eye 04/18/2018   PVD (posterior vitreous detachment), right 04/24/2019   Restless sleeper 11/07/2016   Right hip pain 10/23/2017   RLQ discomfort 03/01/2017   RLS (restless legs syndrome) 05/07/2019   S/P left THA, AA 02/13/2019   Sepsis due to pneumonia ([facility]) 02/16/2019   Sleep apnea    uses  CPAP   Stomach cramps    Tremor 02/15/2016   Urinary frequency 12/22/2017   Urinary incontinence 05/07/2019   Urinary tract infection 11/04/2016   Vitamin D deficiency 04/29/2016     Social History   Socioeconomic History   Marital status: Single    Spouse name: Not on file   Number of children: 3   Years of education: 18   Highest education level: Master's degree (e.g., MA, MS, MEng, MEd, MSW, MBA)  Occupational History   Occupation: retired    Comment: Pharmacist, hospital, kindergarten  Tobacco Use   Smoking status: Never   Smokeless tobacco: Never  Vaping Use   Vaping Use: Never used  Substance and Sexual Activity   Alcohol use: Never    Alcohol/week: 0.0 standard drinks of alcohol   Drug use: Never   Sexual activity: Not Currently    Birth control/protection: None    Comment: lives alone, avoids daiiry and gluten. 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  Other Topics Concern   Not on file  Social History Narrative   Not on file   Social Determinants of Health   Financial Resource Strain: Low Risk  (12/19/2020)   Overall Financial Resource Strain (CARDIA)    Difficulty of Paying Living Expenses: Not hard at all  Food Insecurity: No Food Insecurity (04/28/2022)   Hunger Vital Sign    Worried About Running Out of Food in the Last Year: Never true    Ran Out of Food in the Last Year: Never true  Transportation Needs: No Transportation Needs (04/28/2022)   PRAPARE - Hydrologist (Medical): No    Lack of Transportation (Non-Medical): No  Physical Activity: Insufficiently Active (12/19/2020)   Exercise Vital Sign    Days of Exercise per Week: 1 day    Minutes of Exercise per Session: 30 min  Stress: No Stress Concern Present (12/19/2020)   [place]    Feeling of Stress : Not at all  Social Connections: Moderately Integrated (03/29/2022)   Social Connection and Isolation Panel [NHANES]    Frequency  of Communication with Friends and Family: More than three times a week    Frequency of Social Gatherings with Friends and Family: More than three times a week    Attends Religious Services: More than 4 times per year    Active Member of [employer] or Organizations: Yes  Attends Archivist Meetings: More than 4 times per year    Marital Status: Divorced  Intimate Partner Violence: Not At Risk (12/19/2020)   Humiliation, Afraid, Rape, and Kick questionnaire    Fear of Current or Ex-Partner: No    Emotionally Abused: No    Physically Abused: No    Sexually Abused: No    Past Surgical History:  Procedure Laterality Date   ABDOMINAL HYSTERECTOMY     partial   APPENDECTOMY     BREAST LUMPECTOMY Left 2002   CHOLECYSTECTOMY  Age 47 or 53   COLONOSCOPY  2017   EYE SURGERY Left    cataract   LEFT HEART CATH AND CORONARY ANGIOGRAPHY N/A 02/09/2019   Procedure: LEFT HEART CATH AND CORONARY ANGIOGRAPHY;  Surgeon: Nelva Bush, MD;  Location: [facility];  Service: Cardiovascular;  Laterality: N/A;   TONSILLECTOMY     TOTAL HIP ARTHROPLASTY Left 02/13/2019   Procedure: TOTAL HIP ARTHROPLASTY ANTERIOR APPROACH;  Surgeon: Paralee Cancel, MD;  Location: [facility];  Service: Orthopedics;  Laterality: Left;  70 mins   UPPER GASTROINTESTINAL ENDOSCOPY      Family History  Problem Relation Age of Onset   Colitis Mother    Irritable bowel syndrome Mother    Hypertension Father    Hyperlipidemia Sister    Hyperlipidemia Brother    Heart attack Brother    Hyperlipidemia Brother    Heart attack Brother    Hyperlipidemia Brother    Heart attack Brother    Stomach cancer Paternal Grandmother 67   Colon cancer Paternal Grandmother    Leukemia Daughter    Arthritis Daughter    Heart disease Daughter        ASD vs VSD   Esophageal cancer Neg Hx    Pancreatic cancer Neg Hx    Rectal cancer Neg Hx     Allergies  Allergen Reactions   Valium [Diazepam] Shortness Of Breath    Sob  and bradycardia   Albuterol And Levalbuterol Palpitations    Episode of palpitations, tremor and anxiety when administered levalbuterol for PFT. Recommend avoiding this medicaiton class    Current Outpatient Medications on File Prior to Visit  Medication Sig Dispense Refill   ASPIRIN 81 PO 81 mg daily.      citalopram (CELEXA) 20 MG tablet TAKE 1 TABLET BY MOUTH AT BEDTIME 90 tablet 0   fexofenadine (ALLEGRA ALLERGY) 180 MG tablet Take 1 tablet (180 mg total) by mouth daily as needed for allergies or rhinitis.     FLOVENT HFA 110 MCG/ACT inhaler INHALE 1 TO 2 PUFFS BY MOUTH TWICE DAILY AS NEEDED 12 g 0   furosemide (LASIX) 40 MG tablet Take 1 tablet (40 mg total) by mouth daily. 90 tablet 1   hyoscyamine (LEVSIN SL) 0.125 MG SL tablet Use at least twice a day 90 tablet 1   ipratropium (ATROVENT) 0.02 % nebulizer solution Take 2.5 mLs (0.5 mg total) by nebulization every 6 (six) hours as needed for wheezing or shortness of breath. 75 mL 12   levothyroxine (SYNTHROID) 50 MCG tablet Take 1 tablet (50 mcg total) by mouth daily before breakfast. 90 tablet 1   omega-3 acid ethyl esters (LOVAZA) 1 g capsule Take 2 g by mouth daily.     omeprazole (PRILOSEC) 40 MG capsule Take 1 capsule (40 mg total) by mouth 2 (two) times daily. Please schedule a yearly follow up for further refills. Thank you 60 capsule 1   ondansetron (ZOFRAN) 4  MG tablet Dissolve 1 tablet under tongue once to twice a day, every day 90 tablet 3   Peppermint Oil (IBGARD) 90 MG CPCR Use as directed     potassium chloride (KLOR-CON M) 10 MEQ tablet Take 1 tablet (10 mEq total) by mouth daily. 90 tablet 1   sucralfate (CARAFATE) 1 g tablet Take 1 tablet by mouth twice daily 60 tablet 0   Wheat Dextrin (BENEFIBER) POWD Use as directed, daily  0   No current facility-administered medications on file prior to visit.    BP 130/64   Pulse 69   Temp 97.6 F (36.4 C)   Resp 18   Ht 4' 10"$  (1.473 m)   Wt 114 lb (51.7 kg)   SpO2 95%    BMI 23.83 kg/m         Objective:   Physical Exam  General Mental Status- Alert. General Appearance- Not in acute distress.   Skin General: Color- Normal Color. Moisture- Normal Moisture.  Neck Carotid Arteries- Normal color. Moisture- Normal Moisture. No carotid bruits. No JVD.  Chest and Lung Exam Auscultation: Breath Sounds:-Normal.  Cardiovascular Auscultation:Rythm- Regular. Murmurs & Other Heart Sounds:Auscultation of the heart reveals- No Murmurs.  Abdomen Inspection:-Inspeection Normal. Palpation/Percussion:Note:No mass. Palpation and Percussion of the abdomen reveal-moderate left lower quadrant and suprapubic tenderness, Non Distended + BS, no rebound or guarding.   Neurologic Cranial Nerve exam:- CN III-XII intact(No nystagmus), symmetric smile. Strength:- 5/5 equal and symmetric strength both upper and lower extremities.    Back-mild left CVA tenderness.     Assessment & Plan:   Patient Instructions  Recent onset of frequent urination and dysuria for 3 days.  In addition some left CVA tenderness to palpation and left lower quadrant pain as well as suprapubic pressure.  Approximate weekend and prescribing Cipro 500 mg twice daily for 7 days.  Urine culture being sent out.  In light of your chronic abdomen symptoms and known diverticulosis in sigmoid colon decided to also prescribe Flagyl 500 mg twice daily.  Your urinalysis today did not show blood.  Will get CBC, CMP stat.  Urine culture is pending.  Hydrate well and eat bland foods.  If signs and symptoms worsen or change despite above measures then advise emergency department evaluation.  Follow-up date to be determined after lab review as well as based on how you are feeling on Monday.  Would asked that Monday morning that you send a MyChart update.   You did also mention diarrhea/loose stools over the last week.  I did place stool panel studies to be done.  If you could collect those today and  return before and then start antibiotics that would be ideal.  However if you are not able to turn in stool studies then go ahead and start antibiotic today.   Mackie Pai, PA-C

## 2022-07-02 NOTE — Telephone Encounter (Signed)
Called and spoke with patient. I tried to reassure patient that she has had an extensive work up so far and Dr. Havery Moros does not think that we are missing anything concerning. Pt will see one of Dr. Azalee Course colleagues and they will relay the patient's concerns to her. Pt will call us back if she wishes to proceed with trial of Elavil. Pt verbalized understanding and had no concerns at the end of the call.

## 2022-07-03 LAB — URINE CULTURE
MICRO NUMBER:: 14544646
SPECIMEN QUALITY:: ADEQUATE

## 2022-07-05 ENCOUNTER — Ambulatory Visit: Payer: Medicare HMO | Admitting: Obstetrics and Gynecology

## 2022-07-05 ENCOUNTER — Other Ambulatory Visit: Payer: Medicare HMO

## 2022-07-05 ENCOUNTER — Encounter: Payer: Self-pay | Admitting: Obstetrics and Gynecology

## 2022-07-05 VITALS — BP 128/67 | HR 63

## 2022-07-05 DIAGNOSIS — N3281 Overactive bladder: Secondary | ICD-10-CM | POA: Diagnosis not present

## 2022-07-05 DIAGNOSIS — R197 Diarrhea, unspecified: Secondary | ICD-10-CM | POA: Diagnosis not present

## 2022-07-05 DIAGNOSIS — M25552 Pain in left hip: Secondary | ICD-10-CM | POA: Diagnosis not present

## 2022-07-05 DIAGNOSIS — R351 Nocturia: Secondary | ICD-10-CM | POA: Diagnosis not present

## 2022-07-05 DIAGNOSIS — Z96642 Presence of left artificial hip joint: Secondary | ICD-10-CM | POA: Diagnosis not present

## 2022-07-05 NOTE — Progress Notes (Signed)
[facility]  PTNS VISIT  CC:  Overactive bladder  82 y.o. with refractory overactive bladder who presents for percutaneous tibial nerve stimulation. The patient presents for PTNS session # 6.   Procedure: The patient was placed in the sitting position and the right lower extremity was prepped in the usual fashion. The PTNS needle was then inserted at a 60 degree angle, 5 cm cephalad and 2 cm posterior to the medial malleolus. The PTNS unit was then programmed and an optimal response was noted at 4 milliamps. The PTNS stimulation was then performed at this setting for 30 minutes without incident and the patient tolerated the procedure well. The needle was removed and hemostasis was noted.   The pt will return in 1 week for PTNS session # 7. All questions were answered.

## 2022-07-06 ENCOUNTER — Telehealth: Payer: Self-pay | Admitting: Primary Care

## 2022-07-06 NOTE — Telephone Encounter (Signed)
That is fine with me, thanks

## 2022-07-06 NOTE — Telephone Encounter (Signed)
FYI - I scheduled titration study and when I called pt to give her appt info she states she decided she didn't want to do it.  She has already seen Dr Ron Parker and he is making oral appliance for her.  She is supposed to get it in March.  She already had follow up appt scheduled with you at beginning of March and she still wanted to follow up with you but after she gets appliance.  I rescheduled her to last week in March.  She asked that I let you know she decided not to do the study.

## 2022-07-07 LAB — OVA AND PARASITE EXAMINATION
CONCENTRATE RESULT:: NONE SEEN
MICRO NUMBER:: 14552026
SPECIMEN QUALITY:: ADEQUATE
TRICHROME RESULT:: NONE SEEN

## 2022-07-07 LAB — CLOSTRIDIUM DIFFICILE BY PCR: Toxigenic C. Difficile by PCR: NEGATIVE

## 2022-07-08 ENCOUNTER — Telehealth: Payer: Self-pay | Admitting: Obstetrics and Gynecology

## 2022-07-08 ENCOUNTER — Encounter: Payer: Self-pay | Admitting: Obstetrics and Gynecology

## 2022-07-08 NOTE — Telephone Encounter (Addendum)
Patient called and sounded in distress. She reports severe abdominal pain with loose stools. She reports she was being treated for a UTI but upon review of labs her culture was negative from 07/02/22 and the noted from [facility], PA-C stated they were trying to prophylactically treat for possible diverticulitis concerns. Stool culture negative for C-diff.  Patient is reporting left sided stabbing pain. We talked about how if she does need imaging she needs to go to the ER where they can do CT scans and assess for acute diverticulitis flare.   She reports understanding of going to the ER for evaluation of her abdominal pain. She reports she will tell her daughter, who is a medical provider, and see about going to the ER.

## 2022-07-09 ENCOUNTER — Other Ambulatory Visit: Payer: Self-pay | Admitting: Urology

## 2022-07-09 DIAGNOSIS — N3941 Urge incontinence: Secondary | ICD-10-CM

## 2022-07-09 LAB — STOOL CULTURE: E coli, Shiga toxin Assay: NEGATIVE

## 2022-07-09 MED ORDER — SOLIFENACIN SUCCINATE 5 MG PO TABS: 5.0000 mg | ORAL_TABLET | Freq: Every day | ORAL | 11 refills | Status: DC

## 2022-07-12 ENCOUNTER — Encounter: Payer: Self-pay | Admitting: Obstetrics and Gynecology

## 2022-07-12 ENCOUNTER — Encounter: Payer: Self-pay | Admitting: *Deleted

## 2022-07-12 ENCOUNTER — Ambulatory Visit: Payer: Medicare HMO | Admitting: Obstetrics and Gynecology

## 2022-07-12 VITALS — BP 123/73 | HR 58

## 2022-07-12 DIAGNOSIS — N3281 Overactive bladder: Secondary | ICD-10-CM | POA: Diagnosis not present

## 2022-07-12 DIAGNOSIS — R350 Frequency of micturition: Secondary | ICD-10-CM

## 2022-07-12 DIAGNOSIS — R351 Nocturia: Secondary | ICD-10-CM | POA: Diagnosis not present

## 2022-07-12 NOTE — Progress Notes (Signed)
[facility]  PTNS VISIT  CC:  Overactive bladder  82 y.o. with refractory overactive bladder who presents for percutaneous tibial nerve stimulation. The patient presents for PTNS session # 7.  Patent reports she was getting sick from antibiotics and that was what caused her previous distress that she called about. Patient reports she stopped the antibiotics and has been taking bentyl for the spasms in her stomach.   Patient did not tolerate Myrbetriq well. She reports she is not currently on any bladder medication.   Procedure: The patient was placed in the sitting position and the left lower extremity was prepped in the usual fashion. The PTNS needle was then inserted at a 60 degree angle, 5 cm cephalad and 2 cm posterior to the medial malleolus. The PTNS unit was then programmed and an optimal response was noted at 7 milliamps. The PTNS stimulation was then performed at this setting for 30 minutes without incident and the patient tolerated the procedure well. The needle was removed and hemostasis was noted.   Will have patient fill out bladder diary to assess her progress. Bladder diary instructions discussed and hat given.   The pt will return in 1 week for PTNS session # 8. All questions were answered.

## 2022-07-19 ENCOUNTER — Ambulatory Visit: Payer: Medicare HMO | Admitting: Obstetrics and Gynecology

## 2022-07-19 ENCOUNTER — Encounter: Payer: Self-pay | Admitting: Obstetrics and Gynecology

## 2022-07-19 VITALS — BP 128/77 | HR 57

## 2022-07-19 DIAGNOSIS — R350 Frequency of micturition: Secondary | ICD-10-CM | POA: Diagnosis not present

## 2022-07-19 DIAGNOSIS — N3281 Overactive bladder: Secondary | ICD-10-CM | POA: Diagnosis not present

## 2022-07-19 DIAGNOSIS — R159 Full incontinence of feces: Secondary | ICD-10-CM | POA: Diagnosis not present

## 2022-07-19 DIAGNOSIS — R351 Nocturia: Secondary | ICD-10-CM

## 2022-07-19 LAB — POCT URINALYSIS DIPSTICK
Bilirubin, UA: NEGATIVE
Blood, UA: NEGATIVE
Glucose, UA: NEGATIVE
Ketones, UA: NEGATIVE
Leukocytes, UA: NEGATIVE
Nitrite, UA: NEGATIVE
Protein, UA: NEGATIVE
Spec Grav, UA: 1.025 (ref 1.010–1.025)
Urobilinogen, UA: 0.2 E.U./dL
pH, UA: 5.5 (ref 5.0–8.0)

## 2022-07-19 NOTE — Patient Instructions (Addendum)
Start Gemtesa '75mg'$  once daily Consider options of sacroneuromodulation and bladder botox. You have tried other treatments including other medications and PTNS.   Remember the pros we discussed on bladder botox include: In office procedure and no anesthesia, but the concern is that you may need to in and out catheterize if you are unable to pee.   SNM could be helpful with your bowels as well as your bladder but if you like it after the trial phase it would be a surgical implantation.

## 2022-07-19 NOTE — Progress Notes (Signed)
[facility] Return Visit  SUBJECTIVE  History of Present Illness: Julie Wilkerson is a 82 y.o. female seen in follow-up for OAB and FI. Completed 7 sessions of PTNS and reports at first she saw some symptom relief but now is back at her baseline and elects to stop PTNS.   Has attempted Myrbetriq and had headaches and dizziness related to this. Patient was prescribed Vesicare from Dr. Felipa Wilkerson and she has not been taking it.     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), Anemia, Anginal pain ([place]), Anxiety, Arthritis, Asthma, Atypical chest pain (02/04/2019), Bilateral carotid bruits (03/28/2017), Blood transfusion without reported diagnosis, Breast cancer ([place]), Breast cancer in female [facility]), Bursitis of left hip (09/04/2018), Change in bowel habits (02/22/2018), Change in mole (06/08/2016), Chest pain (03/04/2020), CHF (congestive heart failure) ([place]), Chronic bilateral thoracic back pain (05/23/2016), Chronic diastolic congestive heart failure ([place]) ([id number]), Colitis, Complication of anesthesia, Constipation (01/17/2018), Coronary artery disease involving native coronary artery of native heart without angina pectoris (05/28/2019), Cortical age-related cataract of right eye (04/18/2018), Cough variant asthma  vs UACS from ACEi , Decreased visual acuity (01/16/2017), Degeneration of lumbar intervertebral disc (08/01/2018), Diarrhea (11/16/2015), Dyspnea (11/04/2016), Dysuria (11/04/2016), Elevated sed rate (10/23/2017), Essential hypertension, GERD (gastroesophageal reflux disease), Gluten intolerance, History of chicken pox, History of Helicobacter pylori infection (08/01/2015), History of  rectal bleeding (06/01/2019), LASIK (04/24/2019), Hyperglycemia (11/07/2016), Hyperlipidemia, Hypertension, Hyponatremia (02/16/2019), Hypothyroidism, IBS (irritable bowel syndrome), 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), Left hand pain (05/07/2018), Left hip pain (03/01/2017), Left leg pain (03/01/2017), Left shoulder pain (05/07/2018), Low back pain (08/10/2015), LUQ pain (06/01/2019), 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), 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), Neck pain (03/01/2017), Nuclear sclerotic cataract of right eye (04/18/2018), Osteoporosis, Overweight (BMI 25.0-29.9) (02/14/2019), Pain in joint of left shoulder (08/11/2018), Paroxysmal atrial tachycardia (05/28/2019), Pelvic pain (07/02/2018), Personal history of radiation therapy, Pneumonia, PONV (postoperative nausea and vomiting), Poor appetite (02/22/2018), Pseudophakia of left eye (04/18/2018), PVD (posterior vitreous detachment), right (04/24/2019), Restless sleeper (11/07/2016), Right hip pain (10/23/2017), RLQ discomfort (03/01/2017), RLS (restless legs syndrome) (05/07/2019), S/P left THA, 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   Past Surgical History: She  has a past surgical history that includes Appendectomy; Tonsillectomy; Abdominal hysterectomy; Colonoscopy (2017); Cholecystectomy (Age 20 or 16); Eye surgery (Left); LEFT HEART CATH AND CORONARY ANGIOGRAPHY (N/A, 02/09/2019); Total hip arthroplasty (Left, 02/13/2019); Breast lumpectomy (Left, 2002); and Upper gastrointestinal endoscopy.   Medications: She has a current medication list which includes the following prescription(s): aspirin, ciprofloxacin, citalopram, fexofenadine, flovent hfa, furosemide, hyoscyamine, ipratropium, levothyroxine, omega-3  acid ethyl esters, omeprazole, ondansetron, ibgard, potassium chloride, solifenacin, sucralfate, and benefiber.   Allergies: Patient is allergic to valium [diazepam] and albuterol and levalbuterol.   Social History: Patient  reports that she has never smoked. She has never used smokeless tobacco. She reports that she does not drink alcohol and does not use drugs.      OBJECTIVE     Physical Exam: Vitals:   07/19/22 1031  BP: 128/77  Pulse: (!) 57   Gen: No apparent distress, A&O x 3.  Detailed Urogynecologic Evaluation:  Deferred. Prior exam showed:  POC Urine: Negative for  all components   ASSESSMENT AND PLAN    Ms. Wilkerson is a 82 y.o. with:  1. Overactive bladder   2. Nocturia   3. Incontinence of feces, unspecified fecal incontinence type   4. Urinary frequency    Patient completed 7 PTNS sessions and felt it was not providing relief and wanted to explore other options. She has taken Myrbetriq and had significant side effects and is not a good candidate for anticholinergic medications with her IBS problems and age. Will start on Gemtesa '75mg'$  daily, samples provided at this time.  She is not on a CPAP for her OSA but is being fitted for other therapy interventions.  Is having frequent accidents. Information given for SNM as she is still having bladder frequency and urgency. She may be a candidate for SNM if she wants to pursue this, but hesitation related to her age, comorbid conditions and her aversion to anesthesia. POC urine checked today, no signs of UTI.   Patient to follow up in 4 weeks for medication follow up and discuss further thoughts for bladder botox and SNM. She plans to discuss this with her daughter first.

## 2022-07-20 ENCOUNTER — Encounter (HOSPITAL_BASED_OUTPATIENT_CLINIC_OR_DEPARTMENT_OTHER): Payer: Medicare HMO | Admitting: Pulmonary Disease

## 2022-07-21 ENCOUNTER — Other Ambulatory Visit: Payer: Self-pay

## 2022-07-21 ENCOUNTER — Emergency Department (HOSPITAL_COMMUNITY): Payer: Medicare HMO

## 2022-07-21 ENCOUNTER — Telehealth: Payer: Self-pay | Admitting: Primary Care

## 2022-07-21 ENCOUNTER — Telehealth: Payer: Self-pay | Admitting: Family Medicine

## 2022-07-21 ENCOUNTER — Encounter (HOSPITAL_COMMUNITY): Payer: Self-pay | Admitting: Emergency Medicine

## 2022-07-21 ENCOUNTER — Emergency Department (HOSPITAL_COMMUNITY)
Admission: EM | Admit: 2022-07-21 | Discharge: 2022-07-21 | Disposition: A | Discharge status: Home / Self Care | Payer: Medicare HMO | Attending: Emergency Medicine | Admitting: Emergency Medicine

## 2022-07-21 DIAGNOSIS — H532 Diplopia: Secondary | ICD-10-CM | POA: Diagnosis not present

## 2022-07-21 DIAGNOSIS — I6782 Cerebral ischemia: Secondary | ICD-10-CM | POA: Diagnosis not present

## 2022-07-21 DIAGNOSIS — R9431 Abnormal electrocardiogram [ECG] [EKG]: Secondary | ICD-10-CM | POA: Insufficient documentation

## 2022-07-21 DIAGNOSIS — Z7982 Long term (current) use of aspirin: Secondary | ICD-10-CM | POA: Diagnosis not present

## 2022-07-21 LAB — URINALYSIS, ROUTINE W REFLEX MICROSCOPIC
Bilirubin Urine: NEGATIVE
Glucose, UA: NEGATIVE mg/dL
Hgb urine dipstick: NEGATIVE
Ketones, ur: NEGATIVE mg/dL
Leukocytes,Ua: NEGATIVE
Nitrite: NEGATIVE
Protein, ur: NEGATIVE mg/dL
Specific Gravity, Urine: 1.013 (ref 1.005–1.030)
pH: 5 (ref 5.0–8.0)

## 2022-07-21 LAB — CBC WITH DIFFERENTIAL/PLATELET
Abs Immature Granulocytes: 0.02 10*3/uL (ref 0.00–0.07)
Basophils Absolute: 0 10*3/uL (ref 0.0–0.1)
Basophils Relative: 0 %
Eosinophils Absolute: 0 10*3/uL (ref 0.0–0.5)
Eosinophils Relative: 0 %
HCT: 35 % — ABNORMAL LOW (ref 36.0–46.0)
Hemoglobin: 11.2 g/dL — ABNORMAL LOW (ref 12.0–15.0)
Immature Granulocytes: 0 %
Lymphocytes Relative: 36 %
Lymphs Abs: 1.7 10*3/uL (ref 0.7–4.0)
MCH: 28.6 pg (ref 26.0–34.0)
MCHC: 32 g/dL (ref 30.0–36.0)
MCV: 89.3 fL (ref 80.0–100.0)
Monocytes Absolute: 0.5 10*3/uL (ref 0.1–1.0)
Monocytes Relative: 11 %
Neutro Abs: 2.5 10*3/uL (ref 1.7–7.7)
Neutrophils Relative %: 53 %
Platelets: 195 10*3/uL (ref 150–400)
RBC: 3.92 MIL/uL (ref 3.87–5.11)
RDW: 14.6 % (ref 11.5–15.5)
WBC: 4.8 10*3/uL (ref 4.0–10.5)
nRBC: 0 % (ref 0.0–0.2)

## 2022-07-21 LAB — I-STAT CHEM 8, ED
BUN: 31 mg/dL — ABNORMAL HIGH (ref 8–23)
Calcium, Ion: 1.09 mmol/L — ABNORMAL LOW (ref 1.15–1.40)
Chloride: 103 mmol/L (ref 98–111)
Creatinine, Ser: 1.2 mg/dL — ABNORMAL HIGH (ref 0.44–1.00)
Glucose, Bld: 149 mg/dL — ABNORMAL HIGH (ref 70–99)
HCT: 34 % — ABNORMAL LOW (ref 36.0–46.0)
Hemoglobin: 11.6 g/dL — ABNORMAL LOW (ref 12.0–15.0)
Potassium: 3.8 mmol/L (ref 3.5–5.1)
Sodium: 139 mmol/L (ref 135–145)
TCO2: 26 mmol/L (ref 22–32)

## 2022-07-21 LAB — APTT: aPTT: 21 seconds — ABNORMAL LOW (ref 24–36)

## 2022-07-21 LAB — BASIC METABOLIC PANEL
Anion gap: 10 (ref 5–15)
BUN: 27 mg/dL — ABNORMAL HIGH (ref 8–23)
CO2: 22 mmol/L (ref 22–32)
Calcium: 8.8 mg/dL — ABNORMAL LOW (ref 8.9–10.3)
Chloride: 105 mmol/L (ref 98–111)
Creatinine, Ser: 1.21 mg/dL — ABNORMAL HIGH (ref 0.44–1.00)
GFR, Estimated: 45 mL/min — ABNORMAL LOW (ref >60)
Glucose, Bld: 151 mg/dL — ABNORMAL HIGH (ref 70–99)
Potassium: 3.8 mmol/L (ref 3.5–5.1)
Sodium: 137 mmol/L (ref 135–145)

## 2022-07-21 LAB — PROTIME-INR
INR: 1.1 (ref 0.8–1.2)
Prothrombin Time: 14 seconds (ref 11.4–15.2)

## 2022-07-21 LAB — CBG MONITORING, ED: Glucose-Capillary: 119 mg/dL — ABNORMAL HIGH (ref 70–99)

## 2022-07-21 MED ORDER — GADOBUTROL 1 MMOL/ML IV SOLN
5.0000 mL | Freq: Once | INTRAVENOUS | Status: AC | PRN
  Administered 2022-07-21: 5 mL via INTRAVENOUS

## 2022-07-21 NOTE — Telephone Encounter (Signed)
I have called the sleep center to schedule

## 2022-07-21 NOTE — ED Provider Triage Note (Addendum)
Emergency Medicine Provider Triage Evaluation Note  Julie Wilkerson , a 82 y.o. female  was evaluated in triage.  Pt complains of concerns for diplopia x 1 month.  Notes that she has diplopia when she looks to the left.  Also notes left-sided headache, numbness and tingling to the left side.  Was evaluated by her eye doctor and told to come to the emergency department for further evaluation of her symptoms with MRI to rule out a stroke.  Patient is on a baby aspirin however denies anticoagulants at this time.  Denies chest pain or shortness of breath..  Review of Systems  Positive:  Negative:   Physical Exam  BP 135/83 (BP Location: Right Arm)   Pulse 66   Temp 98 F (36.7 C) (Oral)   Resp 19   SpO2 91%  Gen:   Awake, no distress   Resp:  Normal effort  MSK:   Moves extremities without difficulty  Other:  Negative pronator drift. Strength and sensation intact to BUE and BLE extremities. No facial droop.   Medical Decision Making  Medically screening exam initiated at 4:38 PM.  Appropriate orders placed.  Julie Wilkerson was informed that the remainder of the evaluation will be completed by another provider, this initial triage assessment does not replace that evaluation, and the importance of remaining in the ED until their evaluation is complete.  Per paperwork from ophthalmologist, Dr. Felix Ahmadi requesting MRI to rule out stroke.  MRI brain with and without ordered today.   Arman Loy A, PA-C 07/21/22 1640  5:02 PM - Discussed with RN that patient is in need of a room. RN aware and working on room placement.    Dontarious Schaum A, PA-C 07/21/22 1702

## 2022-07-21 NOTE — Telephone Encounter (Signed)
PT calling saying she went in for titration last night and they did not have her registered. She asked why. I read the notes from the last signed encounter to her but she still did not believe she told us she did not want this test. I read off date, time and exact notes and reminder her of her appt 3/26. Nothing further needed. You can sign off on this.

## 2022-07-21 NOTE — Discharge Instructions (Addendum)
Continue taking aspirin every day.  Follow-up with  neurology

## 2022-07-21 NOTE — ED Provider Notes (Signed)
[facility] Provider Note   CSN: [id number] Arrival date & time: 07/21/22  1622     History  Chief Complaint  Patient presents with   Diplopia   Dizziness    Julie Wilkerson is a 82 y.o. female.  82 year old female presents with left eye diplopia times one 1 month or greater.  Patient states that she has double vision only when she looks to her left.  Denies any vision loss.  No headaches.  No new focal weakness.  Gait has been normal.  Saw an optometrist today who sent her here for stroke workup.  Has any speech disturbance.  No confusion.  Denies any prior history of CVAs.  States her symptoms have been persistent.  No treatment use for this prior to arrival       Home Medications Prior to Admission medications   Medication Sig Start Date End Date Taking? Authorizing Provider  ASPIRIN 81 PO 81 mg daily.     [provider]  ciprofloxacin (CIPRO) 500 MG tablet Take 1 tablet (500 mg total) by mouth 2 (two) times daily. 07/02/22   Saguier, Percell Miller, PA-C  citalopram (CELEXA) 20 MG tablet TAKE 1 TABLET BY MOUTH AT BEDTIME 06/02/22   [name], MD  fexofenadine [facility] ALLERGY) 180 MG tablet Take 1 tablet (180 mg total) by mouth daily as needed for allergies or rhinitis. 12/15/21   [name], MD  FLOVENT HFA 110 MCG/ACT inhaler INHALE 1 TO 2 PUFFS BY MOUTH TWICE DAILY AS NEEDED 06/29/22   Freddi Starr, MD  furosemide (LASIX) 40 MG tablet Take 1 tablet (40 mg total) by mouth daily. 04/14/22   [name], MD  hyoscyamine (LEVSIN SL) 0.125 MG SL tablet Use at least twice a day 06/30/22   Armbruster, Carlota Raspberry, MD  ipratropium (ATROVENT) 0.02 % nebulizer solution Take 2.5 mLs (0.5 mg total) by nebulization every 6 (six) hours as needed for wheezing or shortness of breath. 10/06/21   Terrilyn Saver, NP  levothyroxine (SYNTHROID) 50 MCG tablet Take 1 tablet (50 mcg total) by mouth daily before breakfast. 06/29/22   [name], MD  omega-3 acid ethyl esters (LOVAZA) 1 g capsule Take 2 g by mouth daily.    [provider]  omeprazole (PRILOSEC) 40 MG capsule Take 1 capsule (40 mg total) by mouth 2 (two) times daily. Please schedule a yearly follow up for further refills. Thank you 06/28/22   Yetta Flock, MD  ondansetron (ZOFRAN) 4 MG tablet Dissolve 1 tablet under tongue once to twice a day, every day 04/22/20   Armbruster, Carlota Raspberry, MD  Peppermint Oil (IBGARD) 90 MG CPCR Use as directed 06/30/22   Armbruster, Carlota Raspberry, MD  potassium chloride (KLOR-CON M) 10 MEQ tablet Take 1 tablet (10 mEq total) by mouth daily. 02/26/22   [name], MD  solifenacin (VESICARE) 5 MG tablet Take 1 tablet (5 mg total) by mouth daily. 07/09/22   Stoneking, Reece Leader., MD  sucralfate (CARAFATE) 1 g tablet Take 1 tablet by mouth twice daily 09/08/21   Saguier, Percell Miller, PA-C  Wheat Dextrin [facility]) POWD Use as directed, daily 06/30/22   Armbruster, Carlota Raspberry, MD      Allergies    Valium [diazepam] and Albuterol and levalbuterol    Review of Systems   Review of Systems  All other systems reviewed and are negative.   Physical Exam Updated Vital Signs BP 139/72  Pulse 65   Temp 98 F (36.7 C) (Oral)   Resp 16   Ht 1.473 m ('4\' 10"'$ )   Wt 51.3 kg   SpO2 96%   BMI 23.62 kg/m  Physical Exam Vitals and nursing note reviewed.  Constitutional:      General: She is not in acute distress.    Appearance: Normal appearance. She is well-developed. She is not toxic-appearing.  HENT:     Head: Normocephalic and atraumatic.  Eyes:     General: Lids are normal.     Conjunctiva/sclera: Conjunctivae normal.     Pupils: Pupils are equal, round, and reactive to light.  Neck:     Thyroid: No thyroid mass.     Trachea: No tracheal deviation.  Cardiovascular:     Rate and Rhythm: Normal rate and regular rhythm.     Heart sounds: Normal heart sounds. No murmur heard.    No gallop.  Pulmonary:     Effort: Pulmonary effort  is normal. No respiratory distress.     Breath sounds: Normal breath sounds. No stridor. No decreased breath sounds, wheezing, rhonchi or rales.  Abdominal:     General: There is no distension.     Palpations: Abdomen is soft.     Tenderness: There is no abdominal tenderness. There is no rebound.  Musculoskeletal:        General: No tenderness. Normal range of motion.     Cervical back: Normal range of motion and neck supple.  Skin:    General: Skin is warm and dry.     Findings: No abrasion or rash.  Neurological:     General: No focal deficit present.     Mental Status: She is alert and oriented to person, place, and time. Mental status is at baseline.     GCS: GCS eye subscore is 4. GCS verbal subscore is 5. GCS motor subscore is 6.     Cranial Nerves: No cranial nerve deficit.     Sensory: No sensory deficit.     Motor: Motor function is intact.     Coordination: Coordination is intact.  Psychiatric:        Attention and Perception: Attention normal.        Speech: Speech normal.        Behavior: Behavior normal.     ED Results / Procedures / Treatments   Labs (all labs ordered are listed, but only abnormal results are displayed) Labs Reviewed  CBC WITH DIFFERENTIAL/PLATELET - Abnormal; Notable for the following components:      Result Value   Hemoglobin 11.2 (*)    HCT 35.0 (*)    All other components within normal limits  I-STAT CHEM 8, ED - Abnormal; Notable for the following components:   BUN 31 (*)    Creatinine, Ser 1.20 (*)    Glucose, Bld 149 (*)    Calcium, Ion 1.09 (*)    Hemoglobin 11.6 (*)    HCT 34.0 (*)    All other components within normal limits  CBG MONITORING, ED - Abnormal; Notable for the following components:   Glucose-Capillary 119 (*)    All other components within normal limits  APTT  PROTIME-INR  BASIC METABOLIC PANEL  URINALYSIS, ROUTINE W REFLEX MICROSCOPIC    EKG EKG Interpretation  Date/Time:  Wednesday July 21 2022 16:23:27  EST Ventricular Rate:  65 PR Interval:  156 QRS Duration: 68 QT Interval:  424 QTC Calculation: 440 R Axis:   4 Text Interpretation: Normal  sinus rhythm Nonspecific ST and T wave abnormality Abnormal ECG When compared with ECG of 02-Nov-2021 12:42, PREVIOUS ECG IS PRESENT Confirmed by Lacretia Leigh ([id number]) on 07/21/2022 5:31:15 PM  Radiology No results found.  Procedures Procedures    Medications Ordered in ED Medications - No data to display  ED Course/ Medical Decision Making/ A&P                             Medical Decision Making Patient is EKG per interpretation shows no signs of A-fib. Patient presents with intermittent left eye diplopia.  Patient MRI of her brain which did not show any acute findings.  Labs reviewed and overall reassuring.  Will give patient neurology referral.  No indication of acute CVA.        Final Clinical Impression(s) / ED Diagnoses Final diagnoses:  None    Rx / DC Orders ED Discharge Orders     None         Lacretia Leigh, MD 07/21/22 2130

## 2022-07-21 NOTE — Telephone Encounter (Signed)
[facility] called to make Dr. Charlett Blake aware that they have sent the patient to the ED to be checked for stroke symptoms.

## 2022-07-21 NOTE — ED Notes (Signed)
Pt back from MRI 

## 2022-07-21 NOTE — ED Notes (Signed)
Patient transported to MRI 

## 2022-07-21 NOTE — ED Triage Notes (Addendum)
Pt c/o double vision in left eye for one month. Pt endorses generalized weakness for approx 1 month. Seen at eye doctor for same and was sent here. Endorses dizziness and left sided tingling.

## 2022-07-21 NOTE — ED Notes (Signed)
Pt continues to be in MRI  

## 2022-07-25 NOTE — Assessment & Plan Note (Signed)
Well controlled, no changes to meds. Encouraged heart healthy diet such as the DASH diet and exercise as tolerated.  °

## 2022-07-25 NOTE — Assessment & Plan Note (Signed)
No recent exacerbation noted.

## 2022-07-25 NOTE — Assessment & Plan Note (Signed)
Encourage heart healthy diet such as MIND or DASH diet, increase exercise, avoid trans fats, simple carbohydrates and processed foods, consider a krill or fish or flaxseed oil cap daily.  °

## 2022-07-25 NOTE — Assessment & Plan Note (Signed)
hgba1c acceptable, minimize simple carbs. Increase exercise as tolerated.  

## 2022-07-25 NOTE — Assessment & Plan Note (Signed)
Encouraged increased hydration and fiber in diet. Daily probiotics. If bowels not moving can use MOM 2 tbls po in 4 oz of warm prune juice by mouth every 2-3 days. If no results then repeat in 4 hours with  Dulcolax suppository pr, may repeat again in 4 more hours as needed. Seek care if symptoms worsen. Consider daily Miralax and/or Dulcolax if symptoms persist.  

## 2022-07-25 NOTE — Assessment & Plan Note (Signed)
Euvolemic, no recent exacerbation

## 2022-07-25 NOTE — Assessment & Plan Note (Signed)
Supplement and monitor 

## 2022-07-26 ENCOUNTER — Telehealth: Payer: Self-pay | Admitting: Family Medicine

## 2022-07-26 ENCOUNTER — Ambulatory Visit: Payer: Medicare HMO | Admitting: Obstetrics and Gynecology

## 2022-07-26 ENCOUNTER — Ambulatory Visit (INDEPENDENT_AMBULATORY_CARE_PROVIDER_SITE_OTHER): Payer: Medicare HMO | Admitting: Family Medicine

## 2022-07-26 ENCOUNTER — Telehealth (HOSPITAL_BASED_OUTPATIENT_CLINIC_OR_DEPARTMENT_OTHER): Payer: Self-pay

## 2022-07-26 ENCOUNTER — Telehealth: Payer: Self-pay | Admitting: Gastroenterology

## 2022-07-26 VITALS — BP 128/64 | HR 52 | Temp 97.5°F | Resp 16 | Ht <= 58 in | Wt 113.8 lb

## 2022-07-26 DIAGNOSIS — J45909 Unspecified asthma, uncomplicated: Secondary | ICD-10-CM

## 2022-07-26 DIAGNOSIS — E559 Vitamin D deficiency, unspecified: Secondary | ICD-10-CM

## 2022-07-26 DIAGNOSIS — Z1231 Encounter for screening mammogram for malignant neoplasm of breast: Secondary | ICD-10-CM

## 2022-07-26 DIAGNOSIS — I10 Essential (primary) hypertension: Secondary | ICD-10-CM | POA: Diagnosis not present

## 2022-07-26 DIAGNOSIS — E782 Mixed hyperlipidemia: Secondary | ICD-10-CM | POA: Diagnosis not present

## 2022-07-26 DIAGNOSIS — R739 Hyperglycemia, unspecified: Secondary | ICD-10-CM

## 2022-07-26 DIAGNOSIS — N644 Mastodynia: Secondary | ICD-10-CM | POA: Diagnosis not present

## 2022-07-26 DIAGNOSIS — I509 Heart failure, unspecified: Secondary | ICD-10-CM | POA: Diagnosis not present

## 2022-07-26 DIAGNOSIS — K5900 Constipation, unspecified: Secondary | ICD-10-CM

## 2022-07-26 DIAGNOSIS — R109 Unspecified abdominal pain: Secondary | ICD-10-CM

## 2022-07-26 DIAGNOSIS — H532 Diplopia: Secondary | ICD-10-CM

## 2022-07-26 LAB — CBC WITH DIFFERENTIAL/PLATELET
Basophils Absolute: 0 10*3/uL (ref 0.0–0.1)
Basophils Relative: 0.9 % (ref 0.0–3.0)
Eosinophils Absolute: 0 10*3/uL (ref 0.0–0.7)
Eosinophils Relative: 0 % (ref 0.0–5.0)
HCT: 33.4 % — ABNORMAL LOW (ref 36.0–46.0)
Hemoglobin: 11.3 g/dL — ABNORMAL LOW (ref 12.0–15.0)
Lymphocytes Relative: 37 % (ref 12.0–46.0)
Lymphs Abs: 1.8 10*3/uL (ref 0.7–4.0)
MCHC: 33.9 g/dL (ref 30.0–36.0)
MCV: 87.7 fl (ref 78.0–100.0)
Monocytes Absolute: 0.5 10*3/uL (ref 0.1–1.0)
Monocytes Relative: 10.8 % (ref 3.0–12.0)
Neutro Abs: 2.5 10*3/uL (ref 1.4–7.7)
Neutrophils Relative %: 51.3 % (ref 43.0–77.0)
Platelets: 181 10*3/uL (ref 150.0–400.0)
RBC: 3.81 Mil/uL — ABNORMAL LOW (ref 3.87–5.11)
RDW: 15.7 % — ABNORMAL HIGH (ref 11.5–15.5)
WBC: 4.9 10*3/uL (ref 4.0–10.5)

## 2022-07-26 LAB — COMPREHENSIVE METABOLIC PANEL
ALT: 14 U/L (ref 0–35)
AST: 20 U/L (ref 0–37)
Albumin: 3.7 g/dL (ref 3.5–5.2)
Alkaline Phosphatase: 84 U/L (ref 39–117)
BUN: 20 mg/dL (ref 6–23)
CO2: 27 mEq/L (ref 19–32)
Calcium: 9 mg/dL (ref 8.4–10.5)
Chloride: 106 mEq/L (ref 96–112)
Creatinine, Ser: 1 mg/dL (ref 0.40–1.20)
GFR: 53.15 mL/min — ABNORMAL LOW (ref >60.00)
Glucose, Bld: 79 mg/dL (ref 70–99)
Potassium: 4.6 mEq/L (ref 3.5–5.1)
Sodium: 140 mEq/L (ref 135–145)
Total Bilirubin: 0.5 mg/dL (ref 0.2–1.2)
Total Protein: 6.5 g/dL (ref 6.0–8.3)

## 2022-07-26 LAB — LIPID PANEL
Cholesterol: 234 mg/dL — ABNORMAL HIGH (ref 0–200)
HDL: 71.4 mg/dL (ref >39.00)
LDL Cholesterol: 136 mg/dL — ABNORMAL HIGH (ref 0–99)
NonHDL: 162.6
Total CHOL/HDL Ratio: 3
Triglycerides: 135 mg/dL (ref 0.0–149.0)
VLDL: 27 mg/dL (ref 0.0–40.0)

## 2022-07-26 LAB — VITAMIN D 25 HYDROXY (VIT D DEFICIENCY, FRACTURES): VITD: 24.67 ng/mL — ABNORMAL LOW (ref 30.00–100.00)

## 2022-07-26 LAB — TSH: TSH: 2.34 u[IU]/mL (ref 0.35–5.50)

## 2022-07-26 LAB — HEMOGLOBIN A1C: Hgb A1c MFr Bld: 6.1 % (ref 4.6–6.5)

## 2022-07-26 MED ORDER — BUTALBITAL-ACETAMINOPHEN 25-325 MG PO TABS: 1.0000 | ORAL_TABLET | Freq: Three times a day (TID) | ORAL | 1 refills | Status: DC | PRN

## 2022-07-26 NOTE — Telephone Encounter (Signed)
Good Afternoon Dr. Havery Moros,     Patient called requesting a transfer of care to a different provider because she didn't feel like your don't help her enough so Im going to submit to who is supervising MD for today which is Dr.Cirigiliano (3/4/ at 12/58 pm)  Are you okay with patient transferring to [place] ?

## 2022-07-26 NOTE — Progress Notes (Signed)
Subjective:   By signing my name below, I, Jamey Reas, attest that this documentation has been prepared under the direction and in the presence of [name], MD. 07/26/2022   Patient ID: Julie Wilkerson, female    DOB: 08-29-1940, 82 y.o.   MRN: [id number]  Chief Complaint  Patient presents with   Follow-up    Follow up    HPI Patient is in today for a follow up appointment.  Stomach Pain She complains of stomach discomfort often on her left side and occasionally on her right side that persists when she has a bowel movement. She denies having bloody stools, but has tarry stools. The pain usually lasts a few hours. She takes buscopina to manage it. She is taking 75 mg gemtesa daily PO and finds no relief with it.   Mass under left armpit She has noticed a mass underneath her left armpit that comes and goes several times a week for the past five months. It is tender with palpation.   ED  She was sent to the ED by her ophthalmologist on 07/21/2022 for a stroke workup. She presented with left eye diplopia times one 1 month or greater and had double vision when looking to the left.  MRI  MR Brain W and Wo contrast on 07/21/2022. 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   A1C Her A1C levels are elevated. Lab Results  Component Value Date   HGBA1C 6.1 07/26/2022    Mammogram She complains of intermittent pain on her left breast. Last mammogram completed on 07/20/2021. Findings are normal. Repeat in 1 year.    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 lumpectomy, radtiation x 30   Breast cancer in female [facility])    Bursitis of left hip 09/04/2018   Change in bowel habits 02/22/2018   Change in mole 06/08/2016   Chest pain 03/04/2020   CHF (congestive heart failure) (HCC)    Chronic bilateral thoracic back pain 05/23/2016   Chronic diastolic congestive heart failure ([place]) 05/28/2019   Colitis    Complication of anesthesia    VERY SENSITIVE TO ANESTHESIA    Constipation 01/17/2018   Coronary artery disease involving native coronary artery of native heart without angina pectoris 05/28/2019   Cortical age-related cataract of right eye 04/18/2018   Cough variant asthma  vs UACS from ACEi     Spirometry 12/02/2016  FEV1 1.26 (76%)  Ratio 78 with min curvature p last saba > 6 h prior  - trial off acei 12/02/2016  - 12/02/2016  After extensive coaching HFA effectiveness =    75% with qvar autohaler > rechallenge with 80 2bid     Decreased visual acuity 01/16/2017   Degeneration of lumbar intervertebral disc 08/01/2018   Diarrhea 11/16/2015   D/o Diverticuli, polyps, celiac disease.     Dyspnea 11/04/2016   with asthma exacerbation  only   Dysuria 11/04/2016   Elevated sed rate 10/23/2017   Essential hypertension    Trial off acei 12/02/2016 due to cough/ pseudoasthma   GERD (gastroesophageal reflux disease)    Gluten intolerance    History of chicken pox    History of Helicobacter pylori infection 08/01/2015   History of rectal bleeding 06/01/2019   Hx of LASIK 04/24/2019   Hyperglycemia 11/07/2016   Hyperlipidemia    Hypertension    Hyponatremia 02/16/2019   Hypothyroidism    IBS (irritable bowel syndrome)    Lattice degeneration of right retina 04/24/2019   Left hand pain 05/07/2018   Left hip pain 03/01/2017   Left leg pain 03/01/2017   Left shoulder pain 05/07/2018   Low  back pain 08/10/2015   LUQ pain 06/01/2019   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   Neck pain 03/01/2017   Nuclear sclerotic cataract of right eye 04/18/2018   Osteoporosis    Overweight (BMI 25.0-29.9) 02/14/2019   Pain in joint of left shoulder 08/11/2018   Paroxysmal atrial tachycardia 05/28/2019   Pelvic pain 07/02/2018   Personal history of radiation therapy    Pneumonia    PONV (postoperative nausea and vomiting)    Poor appetite 02/22/2018   Pseudophakia of left eye 04/18/2018   PVD (posterior vitreous detachment), right 04/24/2019   Restless sleeper 11/07/2016   Right hip pain 10/23/2017   RLQ discomfort 03/01/2017   RLS (restless legs syndrome) 05/07/2019   S/P left THA, AA 02/13/2019   Sepsis due to pneumonia ([place]) 02/16/2019   Sleep apnea    uses CPAP   Stomach cramps    Tremor 02/15/2016   Urinary frequency 12/22/2017   Urinary incontinence 05/07/2019   Urinary tract infection 11/04/2016   Vitamin D deficiency 04/29/2016    Past Surgical History:  Procedure Laterality Date   ABDOMINAL HYSTERECTOMY     partial   APPENDECTOMY     BREAST LUMPECTOMY Left 2002   CHOLECYSTECTOMY  Age 4 or 11   COLONOSCOPY  2017   EYE SURGERY Left    cataract   LEFT HEART CATH AND CORONARY ANGIOGRAPHY N/A 02/09/2019   Procedure: LEFT HEART CATH AND CORONARY ANGIOGRAPHY;  Surgeon: Nelva Bush, MD;  Location: [facility];  Service: Cardiovascular;  Laterality: N/A;   TONSILLECTOMY     TOTAL HIP ARTHROPLASTY Left 02/13/2019   Procedure: TOTAL HIP ARTHROPLASTY ANTERIOR APPROACH;  Surgeon: Paralee Cancel, MD;  Location: [facility];  Service: Orthopedics;  Laterality: Left;  70 mins   UPPER GASTROINTESTINAL ENDOSCOPY      Family History  Problem Relation Age of Onset   Colitis Mother    Irritable bowel syndrome Mother    Hypertension Father    Hyperlipidemia Sister    Hyperlipidemia Brother    Heart attack Brother     Hyperlipidemia Brother    Heart attack Brother    Hyperlipidemia Brother    Heart attack Brother    Stomach cancer Paternal Grandmother 13   Colon cancer Paternal Grandmother    Leukemia Daughter    Arthritis Daughter    Heart disease Daughter        ASD vs VSD   Esophageal cancer Neg Hx    Pancreatic cancer Neg Hx    Rectal cancer Neg Hx     Social History   Socioeconomic History   Marital status: Single    Spouse name: Not on file   Number of children: 3  Years of education: 65   Highest education level: Master's degree (e.g., MA, MS, MEng, MEd, MSW, MBA)  Occupational History   Occupation: retired    Comment: Pharmacist, hospital, kindergarten  Tobacco Use   Smoking status: Never   Smokeless tobacco: Never  Vaping Use   Vaping Use: Never used  Substance and Sexual Activity   Alcohol use: Never    Alcohol/week: 0.0 standard drinks of alcohol   Drug use: Never   Sexual activity: Not Currently    Birth control/protection: None    Comment: lives alone, avoids daiiry and gluten. 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  Other Topics Concern   Not on file  Social History Narrative   Not on file   Social Determinants of Health   Financial Resource Strain: Low Risk  (12/19/2020)   Overall Financial Resource Strain (CARDIA)    Difficulty of Paying Living Expenses: Not hard at all  Food Insecurity: No Food Insecurity (04/28/2022)   Hunger Vital Sign    Worried About Running Out of Food in the Last Year: Never true    Ran Out of Food in the Last Year: Never true  Transportation Needs: No Transportation Needs (04/28/2022)   PRAPARE - Hydrologist (Medical): No    Lack of Transportation (Non-Medical): No  Physical Activity: Insufficiently Active (12/19/2020)   Exercise Vital Sign    Days of Exercise per Week: 1 day    Minutes of Exercise per Session: 30 min  Stress: No Stress Concern Present (12/19/2020)   [place]    Feeling of Stress : Not at all  Social Connections: Moderately Integrated (03/29/2022)   Social Connection and Isolation Panel [NHANES]    Frequency of Communication with Friends and Family: More than three times a week    Frequency of Social Gatherings with Friends and Family: More than three times a week    Attends Religious Services: More than 4 times per year    Active Member of [employer] or Organizations: Yes    Attends Music therapist: More than 4 times per year    Marital Status: Divorced  Intimate Partner Violence: Not At Risk (12/19/2020)   Humiliation, Afraid, Rape, and Kick questionnaire    Fear of Current or Ex-Partner: No    Emotionally Abused: No    Physically Abused: No    Sexually Abused: No    Outpatient Medications Prior to Visit  Medication Sig Dispense Refill   ASPIRIN 81 PO 81 mg daily.      citalopram (CELEXA) 20 MG tablet TAKE 1 TABLET BY MOUTH AT BEDTIME 90 tablet 0   fexofenadine (ALLEGRA ALLERGY) 180 MG tablet Take 1 tablet (180 mg total) by mouth daily as needed for allergies or rhinitis.     FLOVENT HFA 110 MCG/ACT inhaler INHALE 1 TO 2 PUFFS BY MOUTH TWICE DAILY AS NEEDED 12 g 0   furosemide (LASIX) 40 MG tablet Take 1 tablet (40 mg total) by mouth daily. 90 tablet 1   hyoscyamine (LEVSIN SL) 0.125 MG SL tablet Use at least twice a day 90 tablet 1   ipratropium (ATROVENT) 0.02 % nebulizer solution Take 2.5 mLs (0.5 mg total) by nebulization every 6 (six) hours as needed for wheezing or shortness of breath. 75 mL 12   levothyroxine (SYNTHROID) 50 MCG tablet Take 1 tablet (50 mcg total) by mouth daily before breakfast. 90 tablet 1   omega-3 acid ethyl esters (LOVAZA) 1 g capsule  Take 2 g by mouth daily.     omeprazole (PRILOSEC) 40 MG capsule Take 1 capsule (40 mg total) by mouth 2 (two) times daily. Please schedule a yearly follow up for further refills. Thank you 60 capsule 1   ondansetron (ZOFRAN) 4 MG tablet Dissolve 1 tablet under  tongue once to twice a day, every day 90 tablet 3   Peppermint Oil (IBGARD) 90 MG CPCR Use as directed     potassium chloride (KLOR-CON M) 10 MEQ tablet Take 1 tablet (10 mEq total) by mouth daily. 90 tablet 1   solifenacin (VESICARE) 5 MG tablet Take 1 tablet (5 mg total) by mouth daily. 30 tablet 11   sucralfate (CARAFATE) 1 g tablet Take 1 tablet by mouth twice daily 60 tablet 0   Vibegron (GEMTESA) 75 MG TABS Take by mouth.     Wheat Dextrin (BENEFIBER) POWD Use as directed, daily  0   ciprofloxacin (CIPRO) 500 MG tablet Take 1 tablet (500 mg total) by mouth 2 (two) times daily. 14 tablet 0   No facility-administered medications prior to visit.    Allergies  Allergen Reactions   Valium [Diazepam] Shortness Of Breath    Sob and bradycardia   Albuterol And Levalbuterol Palpitations    Episode of palpitations, tremor and anxiety when administered levalbuterol for PFT. Recommend avoiding this medicaiton class    Review of Systems  Gastrointestinal:  Positive for abdominal pain. Negative for blood in stool.       (+) tarry stools  Genitourinary:  Positive for flank pain (in left side, occasionally on right side).  Musculoskeletal:        (+) pain in left breast       Objective:    Physical Exam Constitutional:      General: She is not in acute distress.    Appearance: Normal appearance.  HENT:     Head: Normocephalic and atraumatic.     Right Ear: External ear normal.     Left Ear: External ear normal.  Eyes:     Extraocular Movements: Extraocular movements intact.     Pupils: Pupils are equal, round, and reactive to light.  Cardiovascular:     Rate and Rhythm: Normal rate and regular rhythm.     Heart sounds: Normal heart sounds. No murmur heard.    No gallop.  Pulmonary:     Effort: Pulmonary effort is normal. No respiratory distress.     Breath sounds: Normal breath sounds. No wheezing or rales.  Abdominal:     General: Bowel sounds are normal. There is no  distension.     Palpations: Abdomen is soft.     Tenderness: There is no abdominal tenderness. There is no guarding.     Comments: Tender upon palpation  Lymphadenopathy:     Comments: Mass underneath left armpit   Skin:    General: Skin is warm.  Neurological:     Mental Status: She is alert and oriented to person, place, and time.  Psychiatric:        Judgment: Judgment normal.     BP 128/64 (BP Location: Right Arm, Patient Position: Sitting, Cuff Size: Normal)   Pulse (!) 52   Temp (!) 97.5 F (36.4 C) (Oral)   Resp 16   Ht '4\' 10"'$  (1.473 m)   Wt 113 lb 12.8 oz (51.6 kg)   SpO2 97%   BMI 23.78 kg/m  Wt Readings from Last 3 Encounters:  07/27/22 110 lb (49.9 kg)  07/26/22  113 lb 12.8 oz (51.6 kg)  07/21/22 113 lb (51.3 kg)       Assessment & Plan:  Uncomplicated asthma, unspecified asthma severity, unspecified whether persistent Assessment & Plan: No recent exacerbation noted.    Congestive heart failure, unspecified HF chronicity, unspecified heart failure type [facility]) Assessment & Plan: Euvolemic, no recent exacerbation   Constipation, unspecified constipation type Assessment & Plan: Encouraged increased hydration and fiber in diet. Daily probiotics. If bowels not moving can use MOM 2 tbls po in 4 oz of warm prune juice by mouth every 2-3 days. If no results then repeat in 4 hours with  Dulcolax suppository pr, may repeat again in 4 more hours as needed. Seek care if symptoms worsen. Consider daily Miralax and/or Dulcolax if symptoms persist.    Orders: -     CT ABDOMEN PELVIS W CONTRAST; Future  Essential hypertension Assessment & Plan: Well controlled, no changes to meds. Encouraged heart healthy diet such as the DASH diet and exercise as tolerated.    Orders: -     Comprehensive metabolic panel -     TSH -     CBC with Differential/Platelet  Hyperglycemia Assessment & Plan: hgba1c acceptable, minimize simple carbs. Increase exercise as tolerated.    Orders: -     Hemoglobin A1c  Mixed hyperlipidemia Assessment & Plan: Encourage heart healthy diet such as MIND or DASH diet, increase exercise, avoid trans fats, simple carbohydrates and processed foods, consider a krill or fish or flaxseed oil cap daily.   Orders: -     Lipid panel  Vitamin D deficiency Assessment & Plan: Supplement and monitor   Orders: -     VITAMIN D 25 Hydroxy (Vit-D Deficiency, Fractures)  Abdominal pain, unspecified abdominal location Assessment & Plan: Persistent trouble with abdominal pain and she gets some relief from Hyoscyamine, Butalbital and acetaminophen. She is referred back to gastroenterology for further evaluation. CT scan ordered of abdomen to further evaluate  Orders: -     Ambulatory referral to Gastroenterology -     CT ABDOMEN PELVIS W CONTRAST; Future  Encounter for screening mammogram for malignant neoplasm of breast -     MM 3D DIAGNOSTIC MAMMOGRAM BILATERAL BREAST; Future  Breast pain, left Assessment & Plan: Left breast pain and lesion. Diagnostic mammogram ordered.   Orders: -     MM 3D DIAGNOSTIC MAMMOGRAM BILATERAL BREAST; Future  Diplopia Assessment & Plan: Is following with ophthalmology and has had imaging of the brain after presenting to [facility], no concerns identified     I, Penni Homans, MD, personally preformed the services described in this documentation.  All medical record entries made by the scribe were at my direction and in my presence.  I have reviewed the chart and discharge instructions (if applicable) and agree that the record reflects my personal performance and is accurate and complete. 07/26/2022  Penni Homans, MD   Lacretia Leigh as a scribe for Penni Homans, MD.,have documented all relevant documentation on the behalf of Penni Homans, MD,as directed by  Penni Homans, MD while in the presence of Penni Homans, MD.

## 2022-07-26 NOTE — Patient Instructions (Signed)

## 2022-07-26 NOTE — Telephone Encounter (Signed)
[facility] called stating that they did not have the dosage of the pt's butalbital-acetaminophen and needed clarification to ensure what they did have would be ok for the pt. Please Advise.

## 2022-07-27 ENCOUNTER — Other Ambulatory Visit: Payer: Self-pay

## 2022-07-27 ENCOUNTER — Other Ambulatory Visit: Payer: Self-pay | Admitting: Family Medicine

## 2022-07-27 ENCOUNTER — Ambulatory Visit: Payer: Medicare HMO | Admitting: Primary Care

## 2022-07-27 ENCOUNTER — Encounter: Payer: Self-pay | Admitting: Diagnostic Neuroimaging

## 2022-07-27 ENCOUNTER — Ambulatory Visit: Payer: Medicare HMO | Admitting: Diagnostic Neuroimaging

## 2022-07-27 VITALS — BP 140/79 | HR 62 | Ht <= 58 in | Wt 110.0 lb

## 2022-07-27 DIAGNOSIS — H532 Diplopia: Secondary | ICD-10-CM

## 2022-07-27 DIAGNOSIS — N644 Mastodynia: Secondary | ICD-10-CM

## 2022-07-27 DIAGNOSIS — Z1231 Encounter for screening mammogram for malignant neoplasm of breast: Secondary | ICD-10-CM

## 2022-07-27 MED ORDER — VITAMIN D (ERGOCALCIFEROL) 1.25 MG (50000 UNIT) PO CAPS: 50000.0000 [IU] | ORAL_CAPSULE | ORAL | 4 refills | Status: AC

## 2022-07-27 NOTE — Progress Notes (Signed)
[facility]  PATIENT: Julie Wilkerson DOB: 03/28/1941  REFERRING CLINICIAN: [name], MD HISTORY FROM: patient  REASON FOR VISIT: new consult   HISTORICAL  CHIEF COMPLAINT:  Chief Complaint  Patient presents with   New Patient (Initial Visit)    Pt with friend, rm 6 started 2 months ago. She was seeing double vision. Had vision assessment week ago. Waiting on new glasses. Had a MRI completed 2/28 in ER.     HISTORY OF PRESENT ILLNESS:   82 year old female with new onset double vision.  Patient woke up early February 2024 when all of a sudden she noticed double vision when looking to the left side.  Symptoms present only with both eyes open.  She went to optometrist for evaluation and then referred to the ER.  She had MRI of the brain which showed no acute stroke.  Symptoms have been stable since that time.  Patient does report history of left cataract surgery in 2020 with some chronic left eye pain since that time.   REVIEW OF SYSTEMS: Full 14 system review of systems performed and negative with exception of: as per HPI.  ALLERGIES: Allergies  Allergen Reactions   Valium [Diazepam] Shortness Of Breath    Sob and bradycardia   Albuterol And Levalbuterol Palpitations    Episode of palpitations, tremor and anxiety when administered levalbuterol for PFT. Recommend avoiding this medicaiton class    HOME MEDICATIONS: Outpatient Medications Prior to Visit  Medication Sig Dispense Refill   ASPIRIN 81 PO 81 mg daily.      Butalbital-Acetaminophen 25-325 MG TABS Take 1 tablet by mouth every 8 (eight) hours as needed. 60 tablet 1   citalopram (CELEXA) 20 MG tablet TAKE 1 TABLET BY MOUTH AT BEDTIME 90 tablet 0   fexofenadine (ALLEGRA ALLERGY) 180 MG tablet Take 1 tablet (180 mg total) by mouth daily as needed for allergies or rhinitis.     FLOVENT HFA 110 MCG/ACT inhaler INHALE 1 TO 2 PUFFS BY MOUTH TWICE DAILY AS NEEDED 12 g 0   furosemide (LASIX) 40 MG  tablet Take 1 tablet (40 mg total) by mouth daily. 90 tablet 1   hyoscyamine (LEVSIN SL) 0.125 MG SL tablet Use at least twice a day 90 tablet 1   ipratropium (ATROVENT) 0.02 % nebulizer solution Take 2.5 mLs (0.5 mg total) by nebulization every 6 (six) hours as needed for wheezing or shortness of breath. 75 mL 12   levothyroxine (SYNTHROID) 50 MCG tablet Take 1 tablet (50 mcg total) by mouth daily before breakfast. 90 tablet 1   omega-3 acid ethyl esters (LOVAZA) 1 g capsule Take 2 g by mouth daily.     omeprazole (PRILOSEC) 40 MG capsule Take 1 capsule (40 mg total) by mouth 2 (two) times daily. Please schedule a yearly follow up for further refills. Thank you 60 capsule 1   ondansetron (ZOFRAN) 4 MG tablet Dissolve 1 tablet under tongue once to twice a day, every day 90 tablet 3   Peppermint Oil (IBGARD) 90 MG CPCR Use as directed     potassium chloride (KLOR-CON M) 10 MEQ tablet Take 1 tablet (10 mEq total) by mouth daily. 90 tablet 1   solifenacin (VESICARE) 5 MG tablet Take 1 tablet (5 mg total) by mouth daily. 30 tablet 11   sucralfate (CARAFATE) 1 g tablet Take 1 tablet by mouth twice daily 60 tablet 0   Vibegron (GEMTESA) 75 MG TABS Take by mouth.  Vitamin D, Ergocalciferol, (DRISDOL) 1.25 MG (50000 UNIT) CAPS capsule Take 1 capsule (50,000 Units total) by mouth every 7 (seven) days. 5 capsule 4   Wheat Dextrin (BENEFIBER) POWD Use as directed, daily  0   ciprofloxacin (CIPRO) 500 MG tablet Take 1 tablet (500 mg total) by mouth 2 (two) times daily. 14 tablet 0   No facility-administered medications prior to visit.    PAST MEDICAL HISTORY: Past Medical History:  Diagnosis Date   Abdominal pain 11/07/2016   Accelerating angina ([place]) [id number]   Acute diastolic CHF (congestive heart failure) ([place]) 02/16/2019   Acute on chronic diastolic CHF (congestive heart failure) ([place]) 02/23/2019   Acute pansinusitis 04/26/2017   Amblyopia of eye, left 04/19/2018   Anemia    Anginal pain  (HCC)    Anxiety    Arthritis    Asthma    Atypical chest pain 02/04/2019   Bilateral carotid bruits 03/28/2017   Blood transfusion without reported diagnosis    Breast cancer ([place])    2002, left, encapsulated, microcalcifications. lumpectomy, radtiation x 30   Breast cancer in female [facility])    Bursitis of left hip 09/04/2018   Change in bowel habits 02/22/2018   Change in mole 06/08/2016   Chest pain 03/04/2020   CHF (congestive heart failure) (HCC)    Chronic bilateral thoracic back pain 05/23/2016   Chronic diastolic congestive heart failure ([place]) 05/28/2019   Colitis    Complication of anesthesia    VERY SENSITIVE TO ANESTHESIA    Constipation 01/17/2018   Coronary artery disease involving native coronary artery of native heart without angina pectoris 05/28/2019   Cortical age-related cataract of right eye 04/18/2018   Cough variant asthma  vs UACS from ACEi     Spirometry 12/02/2016  FEV1 1.26 (76%)  Ratio 78 with min curvature p last saba > 6 h prior  - trial off acei 12/02/2016  - 12/02/2016  After extensive coaching HFA effectiveness =    75% with qvar autohaler > rechallenge with 80 2bid     Decreased visual acuity 01/16/2017   Degeneration of lumbar intervertebral disc 08/01/2018   Diarrhea 11/16/2015   D/o Diverticuli, polyps, celiac disease.     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   Left hand pain 05/07/2018   Left hip pain  03/01/2017   Left leg pain 03/01/2017   Left shoulder pain 05/07/2018   Low back pain 08/10/2015   LUQ pain 06/01/2019   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   Neck pain 03/01/2017   Nuclear sclerotic cataract of right eye 04/18/2018   Osteoporosis    Overweight (BMI 25.0-29.9) 02/14/2019   Pain in joint of left shoulder 08/11/2018   Paroxysmal atrial tachycardia 05/28/2019   Pelvic pain 07/02/2018   Personal history of radiation therapy    Pneumonia    PONV (postoperative nausea and vomiting)  Poor appetite 02/22/2018   Pseudophakia of left eye 04/18/2018   PVD (posterior vitreous detachment), right 04/24/2019   Restless sleeper 11/07/2016   Right hip pain 10/23/2017   RLQ discomfort 03/01/2017   RLS (restless legs syndrome) 05/07/2019   S/P left THA, AA 02/13/2019   Sepsis due to pneumonia ([place] [place]) 02/16/2019   Sleep apnea    uses CPAP   Stomach cramps    Tremor 02/15/2016   Urinary frequency 12/22/2017   Urinary incontinence 05/07/2019   Urinary tract infection 11/04/2016   Vitamin D deficiency 04/29/2016    PAST SURGICAL HISTORY: Past Surgical History:  Procedure Laterality Date   ABDOMINAL HYSTERECTOMY     partial   APPENDECTOMY     BREAST LUMPECTOMY Left 2002   CHOLECYSTECTOMY  Age 58 or 34   COLONOSCOPY  2017   EYE SURGERY Left    cataract   LEFT HEART CATH AND CORONARY ANGIOGRAPHY N/A 02/09/2019   Procedure: LEFT HEART CATH AND CORONARY ANGIOGRAPHY;  Surgeon: Nelva Bush, MD;  Location: [facility];  Service: Cardiovascular;  Laterality: N/A;   TONSILLECTOMY     TOTAL HIP ARTHROPLASTY Left 02/13/2019   Procedure: TOTAL HIP ARTHROPLASTY ANTERIOR APPROACH;  Surgeon: Paralee Cancel, MD;  Location: [facility];  Service: Orthopedics;  Laterality: Left;  70 mins   UPPER GASTROINTESTINAL ENDOSCOPY      FAMILY HISTORY: Family History  Problem Relation Age of Onset   Colitis Mother    Irritable bowel syndrome  Mother    Hypertension Father    Hyperlipidemia Sister    Hyperlipidemia Brother    Heart attack Brother    Hyperlipidemia Brother    Heart attack Brother    Hyperlipidemia Brother    Heart attack Brother    Stomach cancer Paternal Grandmother 11   Colon cancer Paternal 39    Leukemia Daughter    Arthritis Daughter    Heart disease Daughter        ASD vs VSD   Esophageal cancer Neg Hx    Pancreatic cancer Neg Hx    Rectal cancer Neg Hx     SOCIAL HISTORY: Social History   Socioeconomic History   Marital status: Single    Spouse name: Not on file   Number of children: 3   Years of education: 18   Highest education level: Master's degree (e.g., MA, MS, MEng, MEd, MSW, MBA)  Occupational History   Occupation: retired    Comment: Pharmacist, hospital, kindergarten  Tobacco Use   Smoking status: Never   Smokeless tobacco: Never  Vaping Use   Vaping Use: Never used  Substance and Sexual Activity   Alcohol use: Never    Alcohol/week: 0.0 standard drinks of alcohol   Drug use: Never   Sexual activity: Not Currently    Birth control/protection: None    Comment: lives alone, avoids daiiry and gluten. 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  Other Topics Concern   Not on file  Social History Narrative   Not on file   Social Determinants of Health   Financial Resource Strain: Low Risk  (12/19/2020)   Overall Financial Resource Strain (CARDIA)    Difficulty of Paying Living Expenses: Not hard at all  Food Insecurity: No Food Insecurity (04/28/2022)   Hunger Vital Sign    Worried About Running Out of Food in the Last Year: Never true    Ran Out of Food in the Last Year: Never true  Transportation Needs: No Transportation Needs (04/28/2022)   PRAPARE - Transportation  Lack of Transportation (Medical): No    Lack of Transportation (Non-Medical): No  Physical Activity: Insufficiently Active (12/19/2020)   Exercise Vital Sign    Days of Exercise per Week: 1 day    Minutes of Exercise per  Session: 30 min  Stress: No Stress Concern Present (12/19/2020)   [place]    Feeling of Stress : Not at all  Social Connections: Moderately Integrated (03/29/2022)   Social Connection and Isolation Panel [NHANES]    Frequency of Communication with Friends and Family: More than three times a week    Frequency of Social Gatherings with Friends and Family: More than three times a week    Attends Religious Services: More than 4 times per year    Active Member of [employer] or Organizations: Yes    Attends Archivist Meetings: More than 4 times per year    Marital Status: Divorced  Intimate Partner Violence: Not At Risk (12/19/2020)   Humiliation, Afraid, Rape, and Kick questionnaire    Fear of Current or Ex-Partner: No    Emotionally Abused: No    Physically Abused: No    Sexually Abused: No     PHYSICAL EXAM  GENERAL EXAM/CONSTITUTIONAL: Vitals:  Vitals:   07/27/22 1504  BP: (!) 140/79  Pulse: 62  Weight: 110 lb (49.9 kg)  Height: '4\' 10"'$  (1.473 m)   Body mass index is 22.99 kg/m. Wt Readings from Last 3 Encounters:  07/27/22 110 lb (49.9 kg)  07/26/22 113 lb 12.8 oz (51.6 kg)  07/21/22 113 lb (51.3 kg)   Patient is in no distress; well developed, nourished and groomed; neck is supple  CARDIOVASCULAR: Examination of carotid arteries is normal; no carotid bruits Regular rate and rhythm, no murmurs Examination of peripheral vascular system by observation and palpation is normal  EYES: Ophthalmoscopic exam of optic discs and posterior segments is normal; no papilledema or hemorrhages Vision Screening   Right eye Left eye Both eyes  Without correction     With correction '20/30 20/30 20/40 '$    MUSCULOSKELETAL: Gait, strength, tone, movements noted in Neurologic exam below  NEUROLOGIC: MENTAL STATUS:     06/27/2017    2:25 PM 04/29/2016   11:04 AM  MMSE - Mini Mental State Exam  Orientation to  time 5 5  Orientation to Place 5 5  Registration 3 3  Attention/ Calculation 5 5  Recall 3 3  Language- name 2 objects 2 2  Language- repeat 1 1  Language- follow 3 step command 3 3  Language- read & follow direction 1 1  Write a sentence 1 1  Copy design 1 1  Total score 30 30   awake, alert, oriented to person, place and time recent and remote memory intact normal attention and concentration language fluent, comprehension intact, naming intact fund of knowledge appropriate  CRANIAL NERVE:  2nd - no papilledema on fundoscopic exam 2nd, 3rd, 4th, 6th - RIGHT PUPIL 3MM REACTIVE; LEFT PUPIL 2MM NO REACTION (POST-SURGICAL), visual fields full to confrontation, extraocular muscles intact, no nystagmus; SUBJECTIVE BINOCULAR DOUBLE VISION ON LEFT GAZE 5th - facial sensation symmetric 7th - facial strength symmetric 8th - hearing intact 9th - palate elevates symmetrically, uvula midline 11th - shoulder shrug symmetric 12th - tongue protrusion midline  MOTOR:  normal bulk and tone, full strength in the BUE, BLE MILD POSTURAL HEAD AND ARM TREMOR  SENSORY:  normal and symmetric to light touch, temperature, vibration  COORDINATION:  finger-nose-finger, fine finger movements normal  REFLEXES:  deep tendon reflexes TRACE and symmetric  GAIT/STATION:  narrow based gait     DIAGNOSTIC DATA (LABS, IMAGING, TESTING) - I reviewed patient records, labs, notes, testing and imaging myself where available.  Lab Results  Component Value Date   WBC 4.9 07/26/2022   HGB 11.3 (L) 07/26/2022   HCT 33.4 (L) 07/26/2022   MCV 87.7 07/26/2022   PLT 181.0 07/26/2022      Component Value Date/Time   NA 140 07/26/2022 1136   NA 136 (A) 10/04/2019 0000   K 4.6 07/26/2022 1136   CL 106 07/26/2022 1136   CO2 27 07/26/2022 1136   GLUCOSE 79 07/26/2022 1136   BUN 20 07/26/2022 1136   BUN 28 (A) 10/04/2019 0000   CREATININE 1.00 07/26/2022 1136   CREATININE 1.39 (H) 09/04/2021 1558    CALCIUM 9.0 07/26/2022 1136   PROT 6.5 07/26/2022 1136   ALBUMIN 3.7 07/26/2022 1136   AST 20 07/26/2022 1136   ALT 14 07/26/2022 1136   ALKPHOS 84 07/26/2022 1136   BILITOT 0.5 07/26/2022 1136   GFRNONAA 45 (L) 07/21/2022 1622   GFRAA 57 (L) 04/03/2019 1111   Lab Results  Component Value Date   CHOL 234 (H) 07/26/2022   HDL 71.40 07/26/2022   LDLCALC 136 (H) 07/26/2022   TRIG 135.0 07/26/2022   CHOLHDL 3 07/26/2022   Lab Results  Component Value Date   HGBA1C 6.1 07/26/2022   Lab Results  Component Value Date   [id number] 02/04/2022   Lab Results  Component Value Date   TSH 2.34 07/26/2022    01/13/21 TTE  1. Left ventricular ejection fraction, by estimation, is 60 to 65%. The  left ventricle has normal function. The left ventricle has no regional  wall motion abnormalities. Left ventricular diastolic parameters were  normal.   2. Right ventricular systolic function is normal. The right ventricular  size is normal.   3. The mitral valve is normal in structure. Trivial mitral valve  regurgitation. No evidence of mitral stenosis.   4. The aortic valve is normal in structure. Aortic valve regurgitation is  mild. No aortic stenosis is present.   5. The inferior vena cava is normal in size with greater than 50%  respiratory variability, suggesting right atrial pressure of 3 mmHg.   07/21/22 MRI brain [I reviewed images myself and agree with interpretation. -VRP]  1. No acute intracranial abnormality. 2. Mild chronic microvascular ischemic disease for age.   ASSESSMENT AND PLAN  82 y.o. year old female here with:   Dx:  1. 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 Charlett Blake; monitor BP and sugars and treat as  needed) - check AchR ab  Orders Placed This Encounter  Procedures   AChR Abs with Reflex to MuSK   Return for pending if symptoms worsen or fail to improve, pending test results.    Penni Bombard, MD [id number], [id number] PM Certified in Neurology, Neurophysiology and Neuroimaging  [facility] [address] [place], [place] [address] [phone]

## 2022-07-27 NOTE — Telephone Encounter (Signed)
Okay with me but ultimately up to Dr. Bryan Lemma. She has had extensive workup for chronic pain, I understand she is frustrated.

## 2022-07-28 ENCOUNTER — Other Ambulatory Visit: Payer: Self-pay | Admitting: Family Medicine

## 2022-07-28 MED ORDER — BUTALBITAL-ACETAMINOPHEN 50-325 MG PO TABS: 1.0000 | ORAL_TABLET | Freq: Three times a day (TID) | ORAL | 0 refills | Status: DC | PRN

## 2022-07-29 NOTE — Telephone Encounter (Signed)
Thank you    Dr.Cirigiliano are you ok with this transfer?

## 2022-07-30 DIAGNOSIS — H532 Diplopia: Secondary | ICD-10-CM | POA: Insufficient documentation

## 2022-07-30 DIAGNOSIS — N644 Mastodynia: Secondary | ICD-10-CM | POA: Insufficient documentation

## 2022-07-30 NOTE — Assessment & Plan Note (Signed)
Left breast pain and lesion. Diagnostic mammogram ordered.

## 2022-07-30 NOTE — Assessment & Plan Note (Signed)
Is following with ophthalmology and has had imaging of the brain after presenting to [facility], no concerns identified

## 2022-07-30 NOTE — Assessment & Plan Note (Addendum)
Persistent trouble with abdominal pain and she gets some relief from Hyoscyamine, Butalbital and acetaminophen. She is referred back to gastroenterology for further evaluation. CT scan ordered of abdomen to further evaluate

## 2022-08-02 ENCOUNTER — Ambulatory Visit: Payer: Medicare HMO | Admitting: Obstetrics and Gynecology

## 2022-08-02 NOTE — Telephone Encounter (Signed)
Spoke with patient, advised her of the doctor's recommendation. Patient confirmed understanding.

## 2022-08-03 ENCOUNTER — Telehealth: Payer: Self-pay

## 2022-08-03 ENCOUNTER — Ambulatory Visit (HOSPITAL_BASED_OUTPATIENT_CLINIC_OR_DEPARTMENT_OTHER)
Admission: RE | Admit: 2022-08-03 | Discharge: 2022-08-03 | Disposition: A | Discharge status: Home / Self Care | Payer: Medicare HMO | Source: Ambulatory Visit | Attending: Family Medicine | Admitting: Family Medicine

## 2022-08-03 ENCOUNTER — Encounter (HOSPITAL_BASED_OUTPATIENT_CLINIC_OR_DEPARTMENT_OTHER): Payer: Self-pay

## 2022-08-03 DIAGNOSIS — R109 Unspecified abdominal pain: Secondary | ICD-10-CM | POA: Diagnosis not present

## 2022-08-03 DIAGNOSIS — K5900 Constipation, unspecified: Secondary | ICD-10-CM

## 2022-08-03 DIAGNOSIS — K5730 Diverticulosis of large intestine without perforation or abscess without bleeding: Secondary | ICD-10-CM | POA: Diagnosis not present

## 2022-08-03 MED ORDER — IOHEXOL 300 MG/ML  SOLN
100.0000 mL | Freq: Once | INTRAMUSCULAR | Status: AC | PRN
  Administered 2022-08-03: 100 mL via INTRAVENOUS

## 2022-08-04 LAB — ACHR ABS WITH REFLEX TO MUSK: AChR Binding Ab, Serum: <0.03 nmol/L (ref 0.00–0.24)

## 2022-08-04 LAB — MUSK ANTIBODIES: MuSK Antibodies: <1 U/mL

## 2022-08-05 ENCOUNTER — Ambulatory Visit (INDEPENDENT_AMBULATORY_CARE_PROVIDER_SITE_OTHER): Payer: Medicare HMO

## 2022-08-05 NOTE — Progress Notes (Addendum)
[employer] was contacted through [employer] provider potral to check CPT codes for procedure : Botox CPT code: [phone] and [id number]   PA needed for J0585  Pending Auth #: [id number]   ---Bennett 08/05/22- 08/05/2023

## 2022-08-05 NOTE — Progress Notes (Signed)
Julie Wilkerson is a 82 y.o. female here for a self-cath teaching. I demonstrated on the pt using a mirror how to use a 12f cath to drain urine from the bladder. Pt drained 200cc fromt he bladder using 170fcath with lubricant Pt was able to demonstrate successfully using the catheter and verbalized understanding. Pt was given a self-cath bag with several catheters, lubricant, a mirror, measuring hat.

## 2022-08-05 NOTE — Telephone Encounter (Signed)
b

## 2022-08-09 ENCOUNTER — Ambulatory Visit: Payer: Medicare HMO | Admitting: Obstetrics and Gynecology

## 2022-08-12 ENCOUNTER — Ambulatory Visit
Admission: RE | Admit: 2022-08-12 | Discharge: 2022-08-12 | Disposition: A | Discharge status: Home / Self Care | Payer: Medicare HMO | Source: Ambulatory Visit | Attending: Family Medicine | Admitting: Family Medicine

## 2022-08-12 DIAGNOSIS — N644 Mastodynia: Secondary | ICD-10-CM

## 2022-08-12 DIAGNOSIS — Z1231 Encounter for screening mammogram for malignant neoplasm of breast: Secondary | ICD-10-CM

## 2022-08-12 DIAGNOSIS — N6489 Other specified disorders of breast: Secondary | ICD-10-CM | POA: Diagnosis not present

## 2022-08-12 DIAGNOSIS — R922 Inconclusive mammogram: Secondary | ICD-10-CM | POA: Diagnosis not present

## 2022-08-16 ENCOUNTER — Ambulatory Visit: Payer: Medicare HMO | Admitting: Obstetrics and Gynecology

## 2022-08-16 ENCOUNTER — Ambulatory Visit (HOSPITAL_BASED_OUTPATIENT_CLINIC_OR_DEPARTMENT_OTHER): Payer: Medicare HMO | Attending: Primary Care | Admitting: Pulmonary Disease

## 2022-08-16 DIAGNOSIS — G4761 Periodic limb movement disorder: Secondary | ICD-10-CM | POA: Insufficient documentation

## 2022-08-16 DIAGNOSIS — G4733 Obstructive sleep apnea (adult) (pediatric): Secondary | ICD-10-CM | POA: Diagnosis not present

## 2022-08-17 ENCOUNTER — Encounter: Payer: Self-pay | Admitting: Primary Care

## 2022-08-17 ENCOUNTER — Ambulatory Visit: Payer: Medicare HMO | Admitting: Primary Care

## 2022-08-17 VITALS — BP 112/72 | HR 57 | Ht <= 58 in | Wt 112.6 lb

## 2022-08-17 DIAGNOSIS — G4730 Sleep apnea, unspecified: Secondary | ICD-10-CM | POA: Diagnosis not present

## 2022-08-17 NOTE — Patient Instructions (Addendum)
Continue to wear CPAP until you get oral appliance. If mouth pieces is not working, expectation is that you will resume CPAP with new mask  Follow-up: 3-4 months with [facility] for OSA check up   Sleep Apnea Sleep apnea is a condition in which breathing pauses or becomes shallow during sleep. People with sleep apnea usually snore loudly. They may have times when they gasp and stop breathing for 10 seconds or more during sleep. This may happen many times during the night. Sleep apnea disrupts your sleep and keeps your body from getting the rest that it needs. This condition can increase your risk of certain health problems, including: Heart attack. Stroke. Obesity. Type 2 diabetes. Heart failure. Irregular heartbeat. High blood pressure. The goal of treatment is to help you breathe normally again. What are the causes?  The most common cause of sleep apnea is a collapsed or blocked airway. There are three kinds of sleep apnea: Obstructive sleep apnea. This kind is caused by a blocked or collapsed airway. Central sleep apnea. This kind happens when the part of the brain that controls breathing does not send the correct signals to the muscles that control breathing. Mixed sleep apnea. This is a combination of obstructive and central sleep apnea. What increases the risk? You are more likely to develop this condition if you: Are overweight. Smoke. Have a smaller than normal airway. Are older. Are female. Drink alcohol. Take sedatives or tranquilizers. Have a family history of sleep apnea. Have a tongue or tonsils that are larger than normal. What are the signs or symptoms? Symptoms of this condition include: Trouble staying asleep. Loud snoring. Morning headaches. Waking up gasping. Dry mouth or sore throat in the morning. Daytime sleepiness and tiredness. If you have daytime fatigue because of sleep apnea, you may be more likely to have: Trouble  concentrating. Forgetfulness. Irritability or mood swings. Personality changes. Feelings of depression. Sexual dysfunction. This may include loss of interest if you are female, or erectile dysfunction if you are female. How is this diagnosed? This condition may be diagnosed with: A medical history. A physical exam. A series of tests that are done while you are sleeping (sleep study). These tests are usually done in a sleep lab, but they may also be done at home. How is this treated? Treatment for this condition aims to restore normal breathing and to ease symptoms during sleep. It may involve managing health issues that can affect breathing, such as high blood pressure or obesity. Treatment may include: Sleeping on your side. Using a decongestant if you have nasal congestion. Avoiding the use of depressants, including alcohol, sedatives, and narcotics. Losing weight if you are overweight. Making changes to your diet. Quitting smoking. Using a device to open your airway while you sleep, such as: An oral appliance. This is a custom-made mouthpiece that shifts your lower jaw forward. A continuous positive airway pressure (CPAP) device. This device blows air through a mask when you breathe out (exhale). A nasal expiratory positive airway pressure (EPAP) device. This device has valves that you put into each nostril. A bi-level positive airway pressure (BIPAP) device. This device blows air through a mask when you breathe in (inhale) and breathe out (exhale). Having surgery if other treatments do not work. During surgery, excess tissue is removed to create a wider airway. Follow these instructions at home: Lifestyle Make any lifestyle changes that your health care provider recommends. Eat a healthy, well-balanced diet. Take steps to lose weight if you are  overweight. Avoid using depressants, including alcohol, sedatives, and narcotics. Do not use any products that contain nicotine or tobacco.  These products include cigarettes, chewing tobacco, and vaping devices, such as e-cigarettes. If you need help quitting, ask your health care provider. General instructions Take over-the-counter and prescription medicines only as told by your health care provider. If you were given a device to open your airway while you sleep, use it only as told by your health care provider. If you are having surgery, make sure to tell your health care provider you have sleep apnea. You may need to bring your device with you. Keep all follow-up visits. This is important. Contact a health care provider if: The device that you received to open your airway during sleep is uncomfortable or does not seem to be working. Your symptoms do not improve. Your symptoms get worse. Get help right away if: You develop: Chest pain. Shortness of breath. Discomfort in your back, arms, or stomach. You have: Trouble speaking. Weakness on one side of your body. Drooping in your face. These symptoms may represent a serious problem that is an emergency. Do not wait to see if the symptoms will go away. Get medical help right away. Call your local emergency services (911 in the [place] Do not drive yourself to the hospital. Summary Sleep apnea is a condition in which breathing pauses or becomes shallow during sleep. The most common cause is a collapsed or blocked airway. The goal of treatment is to restore normal breathing and to ease symptoms during sleep. This information is not intended to replace advice given to you by your health care provider. Make sure you discuss any questions you have with your health care provider. Document Revised: 12/17/2020 Document Reviewed: 04/18/2020 Elsevier Patient Education  [employer].

## 2022-08-17 NOTE — Progress Notes (Signed)
Reviewed and agree with assessment/plan.   Chesley Mires, MD [facility] Pulmonary/Critical Care 08/17/2022, 7:16 PM Pager:  [phone]

## 2022-08-17 NOTE — Assessment & Plan Note (Signed)
-   Home sleep study in November 2021 showed moderate obstructive sleep apnea, average AHI 18/hour. She has been struggling with CPAP compliance. She had mask fitting yesterday and was given xs full face mask which she seemed to tolerate some better. She will continue to wear CPAP until she gets oral appliance from Dr. Ron Parker next week. Expectation is that if oral appliance is not effective she will resume CPAP. FU in 3-4 months with [facility] NP or sooner if needed.

## 2022-08-17 NOTE — Telephone Encounter (Signed)
Erroneous entry

## 2022-08-17 NOTE — Progress Notes (Signed)
@Patient  ID: Julie Wilkerson, female    DOB: Dec 25, 1940, 82 y.o.   MRN: [id number]  No chief complaint on file.   Referring provider: [name], MD  HPI:  82 year old female, never smoked.  Past medical history significant for hypertension, chronic congestive heart failure, coronary artery disease, atrial tachycardia, asthma, sleep apnea.  Previous [facility] pulmonary encounter: 04/16/2022 Patient presents today for sleep consult. She follows with Dr. Erin Fulling with [facility] for chronic cough/asthma. She was referred by him during her last office visit d/t history of sleep apnea, she is having issues tolerating CPAP and issues with mask fit. Dr. Claiborne Billings with cardiology was managing her sleep apnea. Home sleep study in November 2021 showed moderate obstructive sleep apnea, average AHI 18/hour. CPAP machine not working well, mask reportedly coming off. It took a long time for her to be able to tolerate her CPAP machine. Her daughter states that most masks were too big. She is currently using Airfit [id number] nasal mask size small, which was working well for her until recently. She reports sinus discomfort with use. She is not getting up any purulent mucus. She uses flonase 1-2 times a day. She is using humidification with CPAP.  Dme is [facility].   Airview download 04/16/21-04/15/22 Days used greater than 4 hours - 291/355 (81%) Average usage - 5 hours 58 minutes Leak 95th percentile- 27 L/min Average AHI 3.3 events per hour  Sleep questionnaire Symptoms- CPAP machine not working     Prior sleep study-Home sleep study 2021 Bedtime- 9:30pm Time to fall asleep- 10-15 mins Nocturnal awakenings- 2-3 times  Out of bed/start of day- 8am Weight changes- n/a Do you operate heavy machinery- no Do you currently wear CPAP- yes Do you current wear oxygen- no Epworth- 9  06/15/2022 Patient presents today for follow-up/OSA. Sleep study November 2021 showed moderate obstructive sleep apnea.   She has been on CPAP for the last 1 to 2 years without issue.  Recently she has been calling with mask fit.  During her last overview we placed an order for her to have mask desensitization study. She received new CPAP mask.  She is still having issue with mask, mainly the back of the headgear is bothering her. She has been unable to wear CPAP. We re-adjusted headgear in office today. She plans to re-try CPAP. She is also interested in alternative treatment options for OSA. She would like to be considered for oral appliance   She has been sleeping well recently. No snoring and waking up gasping for air. She does wake up to use restroom still at night and has some fatigue symptoms.   Airview download 03/15/22-06/12/22 Usage 14/90 days (16%); 9 days (10%) > 4 hours Average usage days used 5 hours 17 mins Pressure 8-14cm h20 Airleaks 11L/min (95%) AHI 2.3  08/17/2022 - interim hx  Patient presents today for 2 month follow-up. Home sleep study in November 2021 showed moderate obstructive sleep apnea, average AHI 18/hour.  Been struggling with CPAP compliance due to mask fit. Her last office visit we adjusted her headgear which seem to help, advised if she was still having difficulty to contact [facility] to set up a time to be refitted for CPAP mask.  She expressed interest in considering oral appliance as an alternative treatment option for sleep apnea.   She had mask fitting last night in sleep lab, she was given xs full face mask. Tolerated this better.  She owns CPAP machine. No longer  getting CPAP supplies, her DME company closed. She did not like new DME company. She saw Dr. Ron Parker and oral appliance is being made and will be ready next week. She sleeps well at night. She rarely wakes up. No significant daytime sleepiness. Denies gasping/choking symptoms.   Airview download 06/03/22-07/02/22 Usage 3/30 days; 1 day (3%) > 4 hours Average usage 18 mins Pressure 8-14cm h20 Airleaks 46L/min AHI  11.4  Allergies  Allergen Reactions   Valium [Diazepam] Shortness Of Breath    Sob and bradycardia   Albuterol And Levalbuterol Palpitations    Episode of palpitations, tremor and anxiety when administered levalbuterol for PFT. Recommend avoiding this medicaiton class    Immunization History  Administered Date(s) Administered   Fluad Quad(high Dose 65+) 02/01/2019, 05/29/2020, 03/17/2021, 03/25/2022   Influenza, High Dose Seasonal PF 03/11/2015, 02/05/2016, 02/05/2017, 03/23/2018   Influenza,inj,quad, With Preservative 05/23/2014, 03/24/2018   PFIZER(Purple Top)SARS-COV-2 Vaccination 06/13/2019, 07/04/2019, 04/22/2020   Pneumococcal Conjugate-13 02/01/2019   Pneumococcal Polysaccharide-23 05/23/2014   Td 02/05/2016   Zoster, Live 05/24/2009    Past Medical History:  Diagnosis Date   Abdominal pain 11/07/2016   Accelerating angina ([place]) [id number]   Acute diastolic CHF (congestive heart failure) ([place]) 02/16/2019   Acute on chronic diastolic CHF (congestive heart failure) ([place]) 02/23/2019   Acute pansinusitis 04/26/2017   Amblyopia of eye, left 04/19/2018   Anemia    Anginal pain (H[facility])    Anxiety    Arthritis    Asthma    Atypical chest pain 02/04/2019   Bilateral carotid bruits 03/28/2017   Blood transfusion without reported diagnosis    Breast cancer ([place])    2002, left, encapsulated, microcalcifications. lumpectomy, radtiation x 30   Breast cancer in female [facility])    Bursitis of left hip 09/04/2018   Change in bowel habits 02/22/2018   Change in mole 06/08/2016   Chest pain 03/04/2020   CHF (congestive heart failure) (HCC)    Chronic bilateral thoracic back pain 05/23/2016   Chronic diastolic congestive heart failure ([place]) 05/28/2019   Colitis    Complication of anesthesia    VERY SENSITIVE TO ANESTHESIA    Constipation 01/17/2018   Coronary artery disease involving native coronary artery of native heart without angina pectoris 05/28/2019   Cortical age-related  cataract of right eye 04/18/2018   Cough variant asthma  vs UACS from ACEi     Spirometry 12/02/2016  FEV1 1.26 (76%)  Ratio 78 with min curvature p last saba > 6 h prior  - trial off acei 12/02/2016  - 12/02/2016  After extensive coaching HFA effectiveness =    75% with qvar autohaler > rechallenge with 80 2bid     Decreased visual acuity 01/16/2017   Degeneration of lumbar intervertebral disc 08/01/2018   Diarrhea 11/16/2015   D/o Diverticuli, polyps, celiac disease.     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   Left hand  pain 05/07/2018   Left hip pain 03/01/2017   Left leg pain 03/01/2017   Left shoulder pain 05/07/2018   Low back pain 08/10/2015   LUQ pain 06/01/2019   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   Neck pain 03/01/2017   Nuclear sclerotic cataract of right eye 04/18/2018   Osteoporosis    Overweight (BMI 25.0-29.9) 02/14/2019   Pain in joint of left shoulder 08/11/2018   Paroxysmal atrial tachycardia 05/28/2019   Pelvic pain 07/02/2018   Personal history of radiation therapy    Pneumonia    PONV (postoperative nausea and vomiting)    Poor appetite 02/22/2018   Pseudophakia of left eye 04/18/2018   PVD (posterior vitreous detachment), right 04/24/2019   Restless sleeper 11/07/2016   Right hip pain 10/23/2017   RLQ discomfort 03/01/2017   RLS (restless legs syndrome) 05/07/2019   S/P  left THA, AA 02/13/2019   Sepsis due to pneumonia ([place]) 02/16/2019   Sleep apnea    uses CPAP   Stomach cramps    Tremor 02/15/2016   Urinary frequency 12/22/2017   Urinary incontinence 05/07/2019   Urinary tract infection 11/04/2016   Vitamin D deficiency 04/29/2016    Tobacco History: Social History   Tobacco Use  Smoking Status Never  Smokeless Tobacco Never   Counseling given: Not Answered   Outpatient Medications Prior to Visit  Medication Sig Dispense Refill   ACETAMINOPHEN-BUTALBITAL 50-325 MG TABS Take 1 tablet by mouth 3 (three) times daily as needed (abd pain). 90 tablet 0   ASPIRIN 81 PO 81 mg daily.      citalopram (CELEXA) 20 MG tablet TAKE 1 TABLET BY MOUTH AT BEDTIME 90 tablet 0   fexofenadine (ALLEGRA ALLERGY) 180 MG tablet Take 1 tablet (180 mg total) by mouth daily as needed for allergies or rhinitis.     FLOVENT HFA 110 MCG/ACT inhaler INHALE 1 TO 2 PUFFS BY MOUTH TWICE DAILY AS NEEDED 12 g 0   furosemide (LASIX) 40 MG tablet Take 1 tablet (40 mg total) by mouth daily. 90 tablet 1   hyoscyamine (LEVSIN SL) 0.125 MG SL tablet Use at least twice a day 90 tablet 1   ipratropium (ATROVENT) 0.02 % nebulizer solution Take 2.5 mLs (0.5 mg total) by nebulization every 6 (six) hours as needed for wheezing or shortness of breath. 75 mL 12   levothyroxine (SYNTHROID) 50 MCG tablet Take 1 tablet (50 mcg total) by mouth daily before breakfast. 90 tablet 1   omega-3 acid ethyl esters (LOVAZA) 1 g capsule Take 2 g by mouth daily.     omeprazole (PRILOSEC) 40 MG capsule Take 1 capsule (40 mg total) by mouth 2 (two) times daily. Please schedule a yearly follow up for further refills. Thank you 60 capsule 1   ondansetron (ZOFRAN) 4 MG tablet Dissolve 1 tablet under tongue once to twice a day, every day 90 tablet 3   Peppermint Oil (IBGARD) 90 MG CPCR Use as directed     potassium chloride (KLOR-CON M) 10 MEQ tablet Take 1 tablet (10 mEq total) by mouth daily. 90 tablet 1    solifenacin (VESICARE) 5 MG tablet Take 1 tablet (5 mg total) by mouth daily. 30 tablet 11   sucralfate (CARAFATE) 1 g tablet Take 1 tablet by mouth twice daily 60 tablet 0   Vibegron (GEMTESA) 75 MG TABS Take by mouth.     Vitamin D, Ergocalciferol, (DRISDOL) 1.25 MG (50000 UNIT) CAPS capsule Take 1 capsule (50,000  Units total) by mouth every 7 (seven) days. 5 capsule 4   Wheat Dextrin (BENEFIBER) POWD Use as directed, daily  0   No facility-administered medications prior to visit.    Review of Systems  Review of Systems  Constitutional:  Negative for fatigue.  Respiratory: Negative.  Negative for apnea.   Psychiatric/Behavioral:  Negative for sleep disturbance.    Physical Exam  There were no vitals taken for this visit. Physical Exam Constitutional:      Appearance: Normal appearance. She is not ill-appearing.  HENT:     Head: Normocephalic and atraumatic.     Mouth/Throat:     Mouth: Mucous membranes are moist.     Pharynx: Oropharynx is clear.  Cardiovascular:     Rate and Rhythm: Normal rate and regular rhythm.  Pulmonary:     Effort: Pulmonary effort is normal.     Breath sounds: Normal breath sounds.  Musculoskeletal:        General: Normal range of motion.  Skin:    General: Skin is warm and dry.  Neurological:     General: No focal deficit present.     Mental Status: She is alert and oriented to person, place, and time. Mental status is at baseline.  Psychiatric:        Mood and Affect: Mood normal.        Behavior: Behavior normal.        Thought Content: Thought content normal.        Judgment: Judgment normal.      Lab Results:  CBC    Component Value Date/Time   WBC 4.9 07/26/2022 1136   RBC 3.81 (L) 07/26/2022 1136   HGB 11.3 (L) 07/26/2022 1136   HGB 11.0 (L) 02/05/2019 1058   HCT 33.4 (L) 07/26/2022 1136   HCT 33.7 (L) 02/05/2019 1058   PLT 181.0 07/26/2022 1136   PLT 227 02/05/2019 1058   MCV 87.7 07/26/2022 1136   MCV 89 02/05/2019 1058    MCH 28.6 07/21/2022 1622   MCHC 33.9 07/26/2022 1136   RDW 15.7 (H) 07/26/2022 1136   RDW 14.0 02/05/2019 1058   LYMPHSABS 1.8 07/26/2022 1136   MONOABS 0.5 07/26/2022 1136   EOSABS 0.0 07/26/2022 1136   BASOSABS 0.0 07/26/2022 1136    BMET    Component Value Date/Time   NA 140 07/26/2022 1136   NA 136 (A) 10/04/2019 0000   K 4.6 07/26/2022 1136   CL 106 07/26/2022 1136   CO2 27 07/26/2022 1136   GLUCOSE 79 07/26/2022 1136   BUN 20 07/26/2022 1136   BUN 28 (A) 10/04/2019 0000   CREATININE 1.00 07/26/2022 1136   CREATININE 1.39 (H) 09/04/2021 1558   CALCIUM 9.0 07/26/2022 1136   GFRNONAA 45 (L) 07/21/2022 1622   GFRAA 57 (L) 04/03/2019 1111    BNP    Component Value Date/Time   BNP 190.5 (H) 12/23/2020 1118    ProBNP    Component Value Date/Time   PROBNP 159.0 (H) 06/23/2021 1130    Imaging: MM DIAG BREAST TOMO BILATERAL  Result Date: 08/12/2022 CLINICAL DATA:  Patient presents for palpable abnormalities bilaterally. EXAM: DIGITAL DIAGNOSTIC BILATERAL MAMMOGRAM WITH TOMOSYNTHESIS; ULTRASOUND LEFT BREAST LIMITED TECHNIQUE: Bilateral digital diagnostic mammography and breast tomosynthesis was performed.; Targeted ultrasound examination of the left breast was performed. COMPARISON:  Previous exam(s). ACR Breast Density Category c: The breasts are heterogeneously dense, which may obscure small masses. FINDINGS: No concerning masses, calcifications or nonsurgical distortion identified within either breast.  Targeted ultrasound is performed, showing normal tissue without suspicious mass at the sites of concern left breast 2 o'clock position 14 cm from nipple and 9 o'clock position 5 cm from the nipple. IMPRESSION: No mammographic evidence for malignancy. RECOMMENDATION: Continued clinical evaluation for left breast palpable areas of concern Screening mammogram in one year.(Code:SM-B-01Y) I have discussed the findings and recommendations with the patient. If applicable, a  reminder letter will be sent to the patient regarding the next appointment. BI-RADS CATEGORY  2: Benign. Electronically Signed   By: Lovey Newcomer M.D.   On: 08/12/2022 10:24   [name] LIMITED ULTRASOUND INCLUDING AXILLA LEFT BREAST   Result Date: 08/12/2022 CLINICAL DATA:  Patient presents for palpable abnormalities bilaterally. EXAM: DIGITAL DIAGNOSTIC BILATERAL MAMMOGRAM WITH TOMOSYNTHESIS; ULTRASOUND LEFT BREAST LIMITED TECHNIQUE: Bilateral digital diagnostic mammography and breast tomosynthesis was performed.; Targeted ultrasound examination of the left breast was performed. COMPARISON:  Previous exam(s). ACR Breast Density Category c: The breasts are heterogeneously dense, which may obscure small masses. FINDINGS: No concerning masses, calcifications or nonsurgical distortion identified within either breast. Targeted ultrasound is performed, showing normal tissue without suspicious mass at the sites of concern left breast 2 o'clock position 14 cm from nipple and 9 o'clock position 5 cm from the nipple. IMPRESSION: No mammographic evidence for malignancy. RECOMMENDATION: Continued clinical evaluation for left breast palpable areas of concern Screening mammogram in one year.(Code:SM-B-01Y) I have discussed the findings and recommendations with the patient. If applicable, a reminder letter will be sent to the patient regarding the next appointment. BI-RADS CATEGORY  2: Benign. Electronically Signed   By: Lovey Newcomer M.D.   On: 08/12/2022 10:24  CT Abdomen Pelvis W Contrast  Result Date: 08/05/2022 CLINICAL DATA:  Abdominal pain, acute, nonlocalized. Patient c/o abdominal pain and discomfort for months, some constipation, no other complaints, hx of HTN, CHF, breast cancer, anemia EXAM: CT ABDOMEN AND PELVIS WITH CONTRAST TECHNIQUE: Multidetector CT imaging of the abdomen and pelvis was performed using the standard protocol following bolus administration of intravenous contrast. RADIATION DOSE REDUCTION: This exam  was performed according to the departmental dose-optimization program which includes automated exposure control, adjustment of the mA and/or kV according to patient size and/or use of iterative reconstruction technique. CONTRAST:  125mL OMNIPAQUE IOHEXOL 300 MG/ML  SOLN COMPARISON:  CT abdomen pelvis 06/10/2021 FINDINGS: Lower chest: Prominent heart size.  Moderate volume hiatal hernia. Hepatobiliary: No focal liver abnormality. Status post cholecystectomy. No biliary dilatation. Pancreas: Diffusely atrophic. No focal lesion. Otherwise normal pancreatic contour. No surrounding inflammatory changes. No main pancreatic ductal dilatation. Spleen: Normal in size without focal abnormality. Adrenals/Urinary Tract: No adrenal nodule bilaterally. Bilateral kidneys enhance symmetrically. No hydronephrosis. No hydroureter. Limited evaluation of the urinary bladder due to streak artifact from the left femoral surgical hardware. On delayed imaging, there is no urothelial wall thickening and there are no filling defects in the opacified portions of the bilateral collecting systems or ureters. Stomach/Bowel: Stomach is within normal limits. No evidence of bowel wall thickening or dilatation. Colonic diverticulosis. Stool throughout the colon. Status post appendectomy. Vascular/Lymphatic: No abdominal aorta or iliac aneurysm. Mild atherosclerotic plaque of the aorta and its branches. No abdominal, pelvic, or inguinal lymphadenopathy. Reproductive: Not well visualized due to streak artifact originating from the left femoral surgical hardware. Status post hysterectomy. No adnexal masses. Other: No intraperitoneal free fluid. No intraperitoneal free gas. No organized fluid collection. Musculoskeletal: Gluteal soft tissue fat stranding likely related to medication injection. No suspicious lytic or blastic osseous lesions. No  acute displaced fracture. Levoscoliosis of the lumbar spine with associated degenerative changes of the lumbar  spine. Total left hip arthroplasty. IMPRESSION: 1. Cardiomegaly. 2. Moderate volume hiatal hernia. 3. Colonic diverticulosis with no acute diverticulitis. 4. Stool throughout the colon-correlate for constipation. 5. Limited evaluation of the pelvis due to streak artifact originating from the left hip surgical hardware. 6.  Aortic Atherosclerosis (ICD10-I70.0). Electronically Signed   By: Iven Finn M.D.   On: 08/05/2022 23:50   MR Brain W and Wo Contrast  Result Date: 07/21/2022 CLINICAL DATA:  Initial evaluation for neuro deficit, stroke suspected. EXAM: MRI HEAD WITHOUT AND WITH CONTRAST TECHNIQUE: Multiplanar, multiecho pulse sequences of the brain and surrounding structures were obtained without and with intravenous contrast. CONTRAST:  32mL GADAVIST GADOBUTROL 1 MMOL/ML IV SOLN COMPARISON:  Prior CT from 04/09/2022. FINDINGS: Brain: Cerebral volume within normal limits. Patchy T2/FLAIR hyperintensity involving the periventricular deep white matter both cerebral hemispheres, most consistent with chronic small vessel ischemic disease, mild for age. No evidence for acute or subacute ischemia. Gray-white matter differentiation maintained. No areas of chronic cortical infarction. No acute or chronic intracranial blood products. No mass lesion, midline shift or mass effect. No hydrocephalus or extra-axial fluid collection. Pituitary gland and suprasellar region within normal limits. No abnormal enhancement. Vascular: Major intracranial vascular flow voids are maintained. Skull and upper cervical spine: Craniocervical junction within normal limits. Bone marrow signal intensity normal. No scalp soft tissue abnormality. Sinuses/Orbits: Prior ocular lens replacement on the left. Globes and orbital soft tissues demonstrate no acute finding. Paranasal sinuses are largely clear. Small chronic appearing left mastoid effusion, of doubtful significance. Other: None. IMPRESSION: 1. No acute intracranial abnormality. 2.  Mild chronic microvascular ischemic disease for age. Electronically Signed   By: Jeannine Boga M.D.   On: 07/21/2022 21:02     Assessment & Plan:   No problem-specific Assessment & Plan notes found for this encounter.     Martyn Ehrich, NP 08/17/2022

## 2022-08-18 ENCOUNTER — Telehealth: Payer: Self-pay | Admitting: Family Medicine

## 2022-08-18 DIAGNOSIS — H532 Diplopia: Secondary | ICD-10-CM | POA: Diagnosis not present

## 2022-08-18 DIAGNOSIS — G4733 Obstructive sleep apnea (adult) (pediatric): Secondary | ICD-10-CM | POA: Diagnosis not present

## 2022-08-18 NOTE — Procedures (Signed)
     Patient Name: Julie Wilkerson, Julie Wilkerson Date: 08/16/2022 Gender: Female D.O.B: 11/06/1940 Age (years): 27 Referring Provider: Geraldo Pitter NP Height (inches): 58 Interpreting Physician: Chesley Mires MD, ABSM Weight (lbs): 113 RPSGT: Zadie Rhine BMI: 24 MRN: [id number] Neck Size: 12.00  CLINICAL INFORMATION The patient is referred for a CPAP titration to treat sleep apnea.  Date of HST 04/15/20: AHI 18.1.  SLEEP STUDY TECHNIQUE As per the AASM Manual for the Scoring of Sleep and Associated Events v2.3 (April 2016) with a hypopnea requiring 4% desaturations.  The channels recorded and monitored were frontal, central and occipital EEG, electrooculogram (EOG), submentalis EMG (chin), nasal and oral airflow, thoracic and abdominal wall motion, anterior tibialis EMG, snore microphone, electrocardiogram, and pulse oximetry. Continuous positive airway pressure (CPAP) was initiated at the beginning of the study and titrated to treat sleep-disordered breathing.  MEDICATIONS Medications self-administered by patient taken the night of the study : N/A  TECHNICIAN COMMENTS Comments added by technician: Pt had one restroom visted. Patient had difficulty initiating sleep. Patient was restless all through the night. Comments added by scorer: N/A  RESPIRATORY PARAMETERS Optimal PAP Pressure (cm): 7 AHI at Optimal Pressure (/hr): 0.6 Overall Minimal O2 (%): 83.0 Supine % at Optimal Pressure (%): 100 Minimal O2 at Optimal Pressure (%): 87.0   SLEEP ARCHITECTURE The study was initiated at 9:57:27 PM and ended at 4:49:55 AM.  Sleep onset time was 32.7 minutes and the sleep efficiency was 56.6%. The total sleep time was 233.5 minutes.  The patient spent 12.4% of the night in stage N1 sleep, 71.5% in stage N2 sleep, 0.0% in stage N3 and 16.1% in REM.Stage REM latency was 243.0 minutes  Wake after sleep onset was 146.3. Alpha intrusion was absent. Supine sleep was 100.00%.  CARDIAC  DATA The 2 lead EKG demonstrated sinus rhythm. The mean heart rate was 56.0 beats per minute. Other EKG findings include: None.  LEG MOVEMENT DATA The total Periodic Limb Movements of Sleep (PLMS) were 0. The PLMS index was 0.0. A PLMS index of <15 is considered normal in adults.  IMPRESSIONS - The optimal PAP pressure was 7 cm of water. - Moderate oxygen desaturations were observed during this titration (min O2 = 83.0%). - No snoring was audible during this study. - No cardiac abnormalities were observed during this study. - Increased periodic limb movements were noted during this study. Arousals associated with PLMs were significant.  DIAGNOSIS - Obstructive Sleep Apnea  - Periodic Limb Movements of Sleep.  RECOMMENDATIONS - Trial of CPAP therapy on 7 cm H2O with a X-Small size Resmed Full Face Mirage Quattro mask and heated humidification. - Avoid alcohol, sedatives and other CNS depressants that may worsen sleep apnea and disrupt normal sleep architecture. - Sleep hygiene should be reviewed to assess factors that may improve sleep quality. - Weight management and regular exercise should be initiated or continued.  [Electronically signed] 08/18/2022 09:27 PM  Chesley Mires MD, ABSM Diplomate, American Board of Sleep Medicine NPI: [id number]  [place] PH: [phone]   FX: [phone] [place]

## 2022-08-18 NOTE — Telephone Encounter (Signed)
Julie Wilkerson is requesting last ov notes from [name] and mri from the ED on 2/28. [phone].

## 2022-08-19 NOTE — Telephone Encounter (Signed)
Called pt lvm that I have copy of last OV note from our office, however we can't print her MRI for but the phone number to the Imaging is [phone] to call them to help with that.

## 2022-08-19 NOTE — Telephone Encounter (Signed)
Faxed over the OV note to [facility].

## 2022-08-26 ENCOUNTER — Ambulatory Visit (INDEPENDENT_AMBULATORY_CARE_PROVIDER_SITE_OTHER): Payer: Medicare HMO | Admitting: Family Medicine

## 2022-08-26 ENCOUNTER — Other Ambulatory Visit: Payer: Self-pay | Admitting: Family Medicine

## 2022-08-26 ENCOUNTER — Ambulatory Visit (HOSPITAL_BASED_OUTPATIENT_CLINIC_OR_DEPARTMENT_OTHER)
Admission: RE | Admit: 2022-08-26 | Discharge: 2022-08-26 | Disposition: A | Discharge status: Home / Self Care | Payer: Medicare HMO | Source: Ambulatory Visit | Attending: Family Medicine | Admitting: Family Medicine

## 2022-08-26 VITALS — BP 142/80 | HR 69 | Temp 97.8°F | Resp 18 | Ht <= 58 in | Wt 113.4 lb

## 2022-08-26 DIAGNOSIS — R051 Acute cough: Secondary | ICD-10-CM | POA: Diagnosis not present

## 2022-08-26 DIAGNOSIS — J40 Bronchitis, not specified as acute or chronic: Secondary | ICD-10-CM

## 2022-08-26 DIAGNOSIS — R059 Cough, unspecified: Secondary | ICD-10-CM | POA: Diagnosis not present

## 2022-08-26 LAB — POCT INFLUENZA A/B
Influenza A, POC: NEGATIVE
Influenza B, POC: NEGATIVE

## 2022-08-26 LAB — POC COVID19 BINAXNOW: SARS Coronavirus 2 Ag: NEGATIVE

## 2022-08-26 MED ORDER — PREDNISONE 20 MG PO TABS
ORAL_TABLET | ORAL | 0 refills | Status: DC
Start: 2022-08-26 — End: 2022-12-08

## 2022-08-26 MED ORDER — DOXYCYCLINE HYCLATE 100 MG PO CAPS
100.0000 mg | ORAL_CAPSULE | Freq: Two times a day (BID) | ORAL | 0 refills | Status: DC
Start: 2022-08-26 — End: 2022-12-08

## 2022-08-26 MED ORDER — BENZONATATE 200 MG PO CAPS
200.0000 mg | ORAL_CAPSULE | Freq: Three times a day (TID) | ORAL | 0 refills | Status: DC | PRN
Start: 2022-08-26 — End: 2022-09-17

## 2022-08-26 NOTE — Progress Notes (Signed)
[place] [facility] [address], [place], [place] [address] [phone]  Date:  08/26/2022   Name:  Julie Wilkerson   DOB:  [birth year]   MRN:  [id number]  PCP:  [name], MD    Chief Complaint: URI (Pt says she has been sick a little over a week. She started to feel better, and then today her cough worsened and is productive. Has been taking mucinex and Neb tx. Covid Neg 4.1.24)   History of Present Illness:  Julie Wilkerson is a 82 y.o. very pleasant female patient who presents with the following:  Pt of Dr Iverson Alamin have not seen her myself previously History of asthma, distant history of breast cancer, history of CHF, CAD, hypertension She started coughing a little over a week ago This continued so she took a covid test which was negative-she took the test 3-4 days ago  She has been using guifenesin She also has been using her nebs and was doing well yesterday.  However she seemed to get worse again today She is coughing and bringing up some phglem today She notes chills but no fever She is sore from the cough and feels a bit achy  She is on flovent at baseline She feels like she might vomit from coughing but has not otherwise been having vomiting or diarrhea   History of pre-diabetes only -recent A1c as below  Lab Results  Component Value Date   HGBA1C 6.1 07/26/2022      Patient Active Problem List   Diagnosis Date Noted   Breast pain, left 07/30/2022   Diplopia 07/30/2022   Otalgia of left ear 05/05/2022   Sore throat 05/05/2022   Urge incontinence 04/28/2022   History of UTI 04/28/2022   Post-menopausal atrophic vaginitis 04/28/2022   Eye pain, left 03/25/2022   Visual changes 03/25/2022   Urinary urgency 03/25/2022   Mild intermittent asthma 08/25/2021   Pseudoaneurysm following procedure 08/25/2021   Sun-damaged skin 07/06/2021   Sinusitis 12/31/2020   Gastroesophageal reflux disease with hiatal  hernia 12/31/2020   Dark stools 09/15/2020   Back pain, chronic 06/24/2020   Sleep apnea 06/24/2020   Stomach cramps    PONV (postoperative nausea and vomiting)    Personal history of radiation therapy    Complication of anesthesia    Colitis    Malignant neoplasm of female breast    Asthma    Anxiety    Bilateral hand pain    Dysuria 02/04/2020   History of rectal bleeding 06/01/2019   Chronic diastolic congestive heart failure 05/28/2019   Paroxysmal atrial tachycardia 05/28/2019   Coronary artery disease involving native coronary artery of native heart without angina pectoris 05/28/2019   Urinary incontinence 05/07/2019   RLS (restless legs syndrome) 05/07/2019   Hx of LASIK 04/24/2019   Lattice degeneration of right retina 04/24/2019   PVD (posterior vitreous detachment), right 04/24/2019   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   CHF (congestive heart failure) 02/23/2019   Hyponatremia 02/16/2019   Overweight (BMI 25.0-29.9) 02/14/2019   History of repair of hip joint 02/13/2019   Accelerating angina 02/09/2019   Preinfarction syndrome 02/09/2019   Atypical chest pain 02/04/2019   Bursitis of left hip 09/04/2018   Pain in joint of left shoulder 08/11/2018   Degeneration of lumbar intervertebral disc 08/01/2018   Pelvic pain 07/02/2018   Left shoulder pain 05/07/2018   Left hand pain 05/07/2018   Pain  of left hand 05/07/2018   Amblyopia of eye, left 04/19/2018   Cortical age-related cataract of right eye 04/18/2018   Nuclear sclerotic cataract of right eye 04/18/2018   Pseudophakia of left eye 04/18/2018   Change in bowel habits 02/22/2018   Nausea and vomiting 02/22/2018   Decrease in appetite 02/22/2018   Anemia 01/17/2018   Constipation 01/17/2018   Increased frequency of urination 12/22/2017   Right hip pain 10/23/2017   Elevated sed rate 10/23/2017   Acute pansinusitis 04/26/2017   Bronchitis 04/26/2017   Carotid bruit 03/28/2017    Nonintractable headache 03/28/2017   Amnesia 03/28/2017   Pain in pelvis 03/01/2017   Neck pain on left side 03/01/2017   Right lower quadrant pain 03/01/2017   Left leg pain 03/01/2017   Reduced visual acuity 01/16/2017   Attention deficit hyperactivity disorder, 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   Hyperglycemia 11/07/2016   Restless sleeper 11/07/2016   Abdominal pain 11/07/2016   Urinary tract infectious disease 11/04/2016   Dyspnea 11/04/2016   Change in mole 06/08/2016   Chronic thoracic back pain 05/23/2016   Vitamin D deficiency 04/29/2016   Tremor 02/15/2016   Diarrhea 11/16/2015   Low back pain 0000000   History of Helicobacter pylori infection 08/01/2015   Cough variant asthma  vs UACS from ACEi     Breast cancer in female    Hyperlipidemia    Essential hypertension    Osteoporosis    Hypothyroid    Irritable bowel disease    Gluten intolerance    History of chickenpox     Past Medical History:  Diagnosis Date   Abdominal pain 11/07/2016   Accelerating angina (HCC) 123456   Acute diastolic CHF (congestive heart failure) ([place]) 02/16/2019   Acute on chronic diastolic CHF (congestive heart failure) ([place]) 02/23/2019   Acute pansinusitis 04/26/2017   Amblyopia of eye, left 04/19/2018   Anemia    Anginal pain ([place])    Anxiety    Arthritis    Asthma    Atypical chest pain 02/04/2019   Bilateral carotid bruits 03/28/2017   Blood transfusion without reported diagnosis    Breast cancer ([place])    2002, left, encapsulated, microcalcifications. lumpectomy, radtiation x 30   Breast cancer in female [facility])    Bursitis of left hip 09/04/2018   Change in bowel habits 02/22/2018   Change in mole 06/08/2016   Chest pain 03/04/2020   CHF (congestive heart failure) (HCC)    Chronic bilateral thoracic back pain 05/23/2016   Chronic diastolic congestive heart failure ([place]) 05/28/2019   Colitis    Complication of anesthesia     VERY SENSITIVE TO ANESTHESIA    Constipation 01/17/2018   Coronary artery disease involving native coronary artery of native heart without angina pectoris 05/28/2019   Cortical age-related cataract of right eye 04/18/2018   Cough variant asthma  vs UACS from ACEi     Spirometry 12/02/2016  FEV1 1.26 (76%)  Ratio 78 with min curvature p last saba > 6 h prior  - trial off acei 12/02/2016  - 12/02/2016  After extensive coaching HFA effectiveness =    75% with qvar autohaler > rechallenge with 80 2bid     Decreased visual acuity 01/16/2017   Degeneration of lumbar intervertebral disc 08/01/2018   Diarrhea 11/16/2015   D/o Diverticuli, polyps, celiac disease.     Dyspnea 11/04/2016   with asthma exacerbation only   Dysuria 11/04/2016   Elevated sed rate 10/23/2017  Essential hypertension    Trial off acei 12/02/2016 due to cough/ pseudoasthma   GERD (gastroesophageal reflux disease)    Gluten intolerance    History of chicken pox    History of Helicobacter pylori infection 08/01/2015   History of rectal bleeding 06/01/2019   Hx of LASIK 04/24/2019   Hyperglycemia 11/07/2016   Hyperlipidemia    Hypertension    Hyponatremia 02/16/2019   Hypothyroidism    IBS (irritable bowel syndrome)    Lattice degeneration of right retina 04/24/2019   Left hand pain 05/07/2018   Left hip pain 03/01/2017   Left leg pain 03/01/2017   Left shoulder pain 05/07/2018   Low back pain 08/10/2015   LUQ pain 06/01/2019   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   Neck pain 03/01/2017   Nuclear sclerotic cataract of right eye 04/18/2018   Osteoporosis    Overweight (BMI 25.0-29.9) 02/14/2019   Pain in joint of left shoulder 08/11/2018   Paroxysmal atrial tachycardia 05/28/2019   Pelvic pain 07/02/2018   Personal history of radiation therapy    Pneumonia    PONV (postoperative nausea and vomiting)    Poor appetite 02/22/2018   Pseudophakia of left eye 04/18/2018    PVD (posterior vitreous detachment), right 04/24/2019   Restless sleeper 11/07/2016   Right hip pain 10/23/2017   RLQ discomfort 03/01/2017   RLS (restless legs syndrome) 05/07/2019   S/P left THA, AA 02/13/2019   Sepsis due to pneumonia ([place]) 02/16/2019   Sleep apnea    uses CPAP   Stomach cramps    Tremor 02/15/2016   Urinary frequency 12/22/2017   Urinary incontinence 05/07/2019   Urinary tract infection 11/04/2016   Vitamin D deficiency 04/29/2016    Past Surgical History:  Procedure Laterality Date   ABDOMINAL HYSTERECTOMY     partial   APPENDECTOMY     BREAST LUMPECTOMY Left 2002   CHOLECYSTECTOMY  Age 89 or 61   COLONOSCOPY  2017   EYE SURGERY Left    cataract   LEFT HEART CATH AND CORONARY ANGIOGRAPHY N/A 02/09/2019   Procedure: LEFT HEART CATH AND CORONARY ANGIOGRAPHY;  Surgeon: Nelva Bush, MD;  Location: [facility];  Service: Cardiovascular;  Laterality: N/A;   TONSILLECTOMY     TOTAL HIP ARTHROPLASTY Left 02/13/2019   Procedure: TOTAL HIP ARTHROPLASTY ANTERIOR APPROACH;  Surgeon: Paralee Cancel, MD;  Location: [facility];  Service: Orthopedics;  Laterality: Left;  70 mins   UPPER GASTROINTESTINAL ENDOSCOPY      Social History   Tobacco Use   Smoking status: Never   Smokeless tobacco: Never  Vaping Use   Vaping Use: Never used  Substance Use Topics   Alcohol use: Never    Alcohol/week: 0.0 standard drinks of alcohol   Drug use: Never    Family History  Problem Relation Age of Onset   Colitis Mother    Irritable bowel syndrome Mother    Hypertension Father    Hyperlipidemia Sister    Hyperlipidemia Brother    Heart attack Brother    Hyperlipidemia Brother    Heart attack Brother    Hyperlipidemia Brother    Heart attack Brother    Stomach cancer Paternal Grandmother 65   Colon cancer Paternal Grandmother    Leukemia Daughter    Arthritis Daughter    Heart disease Daughter        ASD vs VSD   Esophageal cancer Neg Hx    Pancreatic  cancer Neg  Hx    Rectal cancer Neg Hx     Allergies  Allergen Reactions   Valium [Diazepam] Shortness Of Breath    Sob and bradycardia   Albuterol And Levalbuterol Palpitations    Episode of palpitations, tremor and anxiety when administered levalbuterol for PFT. Recommend avoiding this medicaiton class    Medication list has been reviewed and updated.  Current Outpatient Medications on File Prior to Visit  Medication Sig Dispense Refill   ASPIRIN 81 PO 81 mg daily.      citalopram (CELEXA) 20 MG tablet TAKE 1 TABLET BY MOUTH AT BEDTIME 90 tablet 0   fexofenadine (ALLEGRA ALLERGY) 180 MG tablet Take 1 tablet (180 mg total) by mouth daily as needed for allergies or rhinitis.     FLOVENT HFA 110 MCG/ACT inhaler INHALE 1 TO 2 PUFFS BY MOUTH TWICE DAILY AS NEEDED 12 g 0   furosemide (LASIX) 40 MG tablet Take 1 tablet (40 mg total) by mouth daily. 90 tablet 1   hyoscyamine (LEVSIN SL) 0.125 MG SL tablet Use at least twice a day 90 tablet 1   ipratropium (ATROVENT) 0.02 % nebulizer solution Take 2.5 mLs (0.5 mg total) by nebulization every 6 (six) hours as needed for wheezing or shortness of breath. 75 mL 12   levothyroxine (SYNTHROID) 50 MCG tablet Take 1 tablet (50 mcg total) by mouth daily before breakfast. 90 tablet 1   omega-3 acid ethyl esters (LOVAZA) 1 g capsule Take 2 g by mouth daily.     omeprazole (PRILOSEC) 40 MG capsule Take 1 capsule (40 mg total) by mouth 2 (two) times daily. Please schedule a yearly follow up for further refills. Thank you 60 capsule 1   ondansetron (ZOFRAN) 4 MG tablet Dissolve 1 tablet under tongue once to twice a day, every day 90 tablet 3   Peppermint Oil (IBGARD) 90 MG CPCR Use as directed     potassium chloride (KLOR-CON M) 10 MEQ tablet Take 1 tablet (10 mEq total) by mouth daily. 90 tablet 1   sucralfate (CARAFATE) 1 g tablet Take 1 tablet by mouth twice daily 60 tablet 0   Vitamin D, Ergocalciferol, (DRISDOL) 1.25 MG (50000 UNIT) CAPS capsule Take 1  capsule (50,000 Units total) by mouth every 7 (seven) days. 5 capsule 4   No current facility-administered medications on file prior to visit.    Review of Systems:  As per HPI- otherwise negative. Marland Kitchen  Physical Examination: Vitals:   08/26/22 1408  BP: (!) 142/80  Pulse: 69  Resp: 18  Temp: 97.8 F (36.6 C)  SpO2: 96%   Vitals:   08/26/22 1408  Weight: 113 lb 6.4 oz (51.4 kg)  Height: 4\' 10"  (1.473 m)   Body mass index is 23.7 kg/m. Ideal Body Weight: Weight in (lb) to have BMI = 25: 119.4  GEN: no acute distress.  Slight build, looks well but is coughing frequently HEENT: Atraumatic, Normocephalic.  Bilateral TM wnl, oropharynx normal.  PEERL,EOMI.   Ears and Nose: No external deformity. CV: RRR, No M/G/R. No JVD. No thrill. No extra heart sounds. PULM: CTA B, no wheezes, crackles, rhonchi. No retractions. No resp. distress. No accessory muscle use. ABD: S, NT, ND EXTR: No c/c/e PSYCH: Normally interactive. Conversant.   Recent CMP, CBC on chart about 1 month ago-reviewed  Results for orders placed or performed in visit on 08/26/22  POC COVID-19 BinaxNow  Result Value Ref Range   SARS Coronavirus 2 Ag Negative Negative  POCT Influenza A/B  Result Value Ref Range   Influenza A, POC Negative Negative   Influenza B, POC Negative Negative    DG Chest 2 View  Result Date: 08/26/2022 CLINICAL DATA:  Cough and congestion for 1 week. EXAM: CHEST - 2 VIEW COMPARISON:  Chest radiographs 02/04/2022 and 11/02/2021 FINDINGS: Cardiac silhouette and mediastinal contours are within normal limits. Mild-to-moderate calcification within the aortic arch. Left-greater-than-right mid lung and bilateral lower lung horizontal linear scarring is unchanged. No acute airspace opacity. No pleural effusion pneumothorax. Moderate multilevel degenerative disc changes of the thoracic spine. Mild to moderate S-shaped thoracolumbar curvature is similar to prior. IMPRESSION: 1. No active  cardiopulmonary disease. 2. Unchanged bilateral mid and lower lung linear scarring. Electronically Signed   By: Yvonne Kendall M.D.   On: 08/26/2022 15:14    Assessment and Plan: Acute cough - Plan: DG Chest 2 View, POC COVID-19 BinaxNow, POCT Influenza A/B, doxycycline (VIBRAMYCIN) 100 MG capsule, predniSONE (DELTASONE) 20 MG tablet, benzonatate (TESSALON) 200 MG capsule  Wheezy bronchitis - Plan: doxycycline (VIBRAMYCIN) 100 MG capsule, predniSONE (DELTASONE) 20 MG tablet, benzonatate (TESSALON) 200 MG capsule  Patient seen today with cough, likely exacerbation of asthma with wheezy bronchitis.  Will treat her with doxycycline, prednisone.  I offered a cough syrup, patient's daughter notes she is very sensitive to sedating substances.  Therefore, we will have her try some Tessalon Perles if not too expensive.  I have asked him to let me know if she is not feeling better in the next few days, sooner if she is getting worse  Signed Lamar Blinks, MD

## 2022-09-01 ENCOUNTER — Ambulatory Visit: Payer: Medicare HMO | Admitting: Family

## 2022-09-01 NOTE — Progress Notes (Shared)
Subjective:   By signing my name below, I, Julie Wilkerson, attest that this documentation has been prepared under the direction and in the presence of Julie Craze, NP. 09/01/2022   Patient ID: Julie Wilkerson, female    DOB: [birth year], 82 y.o.   MRN: [id number]  No chief complaint on file.   HPI Patient is in today for an office visit.   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 lumpectomy, radtiation x 30   Breast cancer in female [facility])    Bursitis of left hip 09/04/2018   Change in bowel habits 02/22/2018   Change in mole 06/08/2016   Chest pain 03/04/2020   CHF (congestive heart failure) (HCC)    Chronic bilateral thoracic back pain 05/23/2016   Chronic diastolic congestive heart failure (HCC) 05/28/2019   Colitis    Complication of anesthesia    VERY SENSITIVE TO ANESTHESIA    Constipation 01/17/2018   Coronary artery disease involving native coronary artery of native heart without angina pectoris 05/28/2019   Cortical age-related cataract of right eye 04/18/2018   Cough variant asthma  vs UACS from ACEi     Spirometry 12/02/2016  FEV1 1.26 (76%)  Ratio 78 with min curvature p last saba > 6 h prior  - trial off acei 12/02/2016  - 12/02/2016  After extensive coaching HFA effectiveness =    75% with qvar autohaler > rechallenge with 80 2bid     Decreased visual acuity 01/16/2017   Degeneration of lumbar intervertebral disc 08/01/2018    Diarrhea 11/16/2015   D/o Diverticuli, polyps, celiac disease.     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   Left hand pain 05/07/2018   Left hip pain 03/01/2017   Left leg pain 03/01/2017   Left shoulder pain 05/07/2018   Low back pain 08/10/2015   LUQ pain 06/01/2019   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   Neck pain 03/01/2017   Nuclear sclerotic cataract of right eye 04/18/2018   Osteoporosis    Overweight (BMI 25.0-29.9) 02/14/2019   Pain in joint of left shoulder 08/11/2018   Paroxysmal atrial tachycardia 05/28/2019   Pelvic pain 07/02/2018   Personal history of radiation therapy    Pneumonia    PONV (postoperative nausea and vomiting)    Poor appetite 02/22/2018  Pseudophakia of left eye 04/18/2018   PVD (posterior vitreous detachment), right 04/24/2019   Restless sleeper 11/07/2016   Right hip pain 10/23/2017   RLQ discomfort 03/01/2017   RLS (restless legs syndrome) 05/07/2019   S/P left THA, AA 02/13/2019   Sepsis due to pneumonia (HCC) 02/16/2019   Sleep apnea    uses CPAP   Stomach cramps    Tremor 02/15/2016   Urinary frequency 12/22/2017   Urinary incontinence 05/07/2019   Urinary tract infection 11/04/2016   Vitamin D deficiency 04/29/2016    Past Surgical History:  Procedure Laterality Date   ABDOMINAL HYSTERECTOMY     partial   APPENDECTOMY     BREAST LUMPECTOMY  Left 2002   CHOLECYSTECTOMY  Age 54 or 41   COLONOSCOPY  2017   EYE SURGERY Left    cataract   LEFT HEART CATH AND CORONARY ANGIOGRAPHY N/A 02/09/2019   Procedure: LEFT HEART CATH AND CORONARY ANGIOGRAPHY;  Surgeon: Yvonne Kendall, MD;  Location: [facility];  Service: Cardiovascular;  Laterality: N/A;   TONSILLECTOMY     TOTAL HIP ARTHROPLASTY Left 02/13/2019   Procedure: TOTAL HIP ARTHROPLASTY ANTERIOR APPROACH;  Surgeon: Durene Romans, MD;  Location: [facility];  Service: Orthopedics;  Laterality: Left;  70 mins   UPPER GASTROINTESTINAL ENDOSCOPY      Family History  Problem Relation Age of Onset   Colitis Mother    Irritable bowel syndrome Mother    Hypertension Father    Hyperlipidemia Sister    Hyperlipidemia Brother    Heart attack Brother    Hyperlipidemia Brother    Heart attack Brother    Hyperlipidemia Brother    Heart attack Brother    Stomach cancer Paternal Grandmother 44   Colon cancer Paternal Grandmother    Leukemia Daughter    Arthritis Daughter    Heart disease Daughter        ASD vs VSD   Esophageal cancer Neg Hx    Pancreatic cancer Neg Hx    Rectal cancer Neg Hx     Social History   Socioeconomic History   Marital status: Single    Spouse name: Not on file   Number of children: 3   Years of education: 18   Highest education level: Master's degree (e.g., MA, MS, MEng, MEd, MSW, MBA)  Occupational History   Occupation: retired    Comment: Runner, broadcasting/film/video, kindergarten  Tobacco Use   Smoking status: Never   Smokeless tobacco: Never  Vaping Use   Vaping Use: Never used  Substance and Sexual Activity   Alcohol use: Never    Alcohol/week: 0.0 standard drinks of alcohol   Drug use: Never   Sexual activity: Not Currently    Birth control/protection: None    Comment: lives alone, avoids daiiry and gluten. 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  Other Topics Concern   Not on file  Social History Narrative   Not on file   Social Determinants of Health    Financial Resource Strain: Low Risk  (12/19/2020)   Overall Financial Resource Strain (CARDIA)    Difficulty of Paying Living Expenses: Not hard at all  Food Insecurity: No Food Insecurity (04/28/2022)   Hunger Vital Sign    Worried About Running Out of Food in the Last Year: Never true    Ran Out of Food in the Last Year: Never true  Transportation Needs: No Transportation Needs (04/28/2022)   PRAPARE - Administrator, Civil Service (Medical): No    Lack  of Transportation (Non-Medical): No  Physical Activity: Insufficiently Active (12/19/2020)   Exercise Vital Sign    Days of Exercise per Week: 1 day    Minutes of Exercise per Session: 30 min  Stress: No Stress Concern Present (12/19/2020)   [employer] of Occupational Health - Occupational Stress Questionnaire    Feeling of Stress : Not at all  Social Connections: Moderately Integrated (03/29/2022)   Social Connection and Isolation Panel [NHANES]    Frequency of Communication with Friends and Family: More than three times a week    Frequency of Social Gatherings with Friends and Family: More than three times a week    Attends Religious Services: More than 4 times per year    Active Member of [employer] or Organizations: Yes    Attends Engineer, structural: More than 4 times per year    Marital Status: Divorced  Intimate Partner Violence: Not At Risk (12/19/2020)   Humiliation, Afraid, Rape, and Kick questionnaire    Fear of Current or Ex-Partner: No    Emotionally Abused: No    Physically Abused: No    Sexually Abused: No    Outpatient Medications Prior to Visit  Medication Sig Dispense Refill   ASPIRIN 81 PO 81 mg daily.      benzonatate (TESSALON) 200 MG capsule Take 1 capsule (200 mg total) by mouth 3 (three) times daily as needed for cough. 40 capsule 0   citalopram (CELEXA) 20 MG tablet TAKE 1 TABLET BY MOUTH AT BEDTIME 90 tablet 0   doxycycline (VIBRAMYCIN) 100 MG capsule Take 1 capsule (100 mg total)  by mouth 2 (two) times daily. 20 capsule 0   fexofenadine (ALLEGRA ALLERGY) 180 MG tablet Take 1 tablet (180 mg total) by mouth daily as needed for allergies or rhinitis.     FLOVENT HFA 110 MCG/ACT inhaler INHALE 1 TO 2 PUFFS BY MOUTH TWICE DAILY AS NEEDED 12 g 0   furosemide (LASIX) 40 MG tablet Take 1 tablet (40 mg total) by mouth daily. 90 tablet 1   hyoscyamine (LEVSIN SL) 0.125 MG SL tablet Use at least twice a day 90 tablet 1   ipratropium (ATROVENT) 0.02 % nebulizer solution Take 2.5 mLs (0.5 mg total) by nebulization every 6 (six) hours as needed for wheezing or shortness of breath. 75 mL 12   levothyroxine (SYNTHROID) 50 MCG tablet Take 1 tablet (50 mcg total) by mouth daily before breakfast. 90 tablet 1   omega-3 acid ethyl esters (LOVAZA) 1 g capsule Take 2 g by mouth daily.     omeprazole (PRILOSEC) 40 MG capsule Take 1 capsule (40 mg total) by mouth 2 (two) times daily. Please schedule a yearly follow up for further refills. Thank you 60 capsule 1   ondansetron (ZOFRAN) 4 MG tablet Dissolve 1 tablet under tongue once to twice a day, every day 90 tablet 3   Peppermint Oil (IBGARD) 90 MG CPCR Use as directed     potassium chloride (KLOR-CON M) 10 MEQ tablet Take 1 tablet (10 mEq total) by mouth daily. 90 tablet 1   predniSONE (DELTASONE) 20 MG tablet Take 40 mg by mouth daily for 3 days then 20 mg daily for 3 days 9 tablet 0   sucralfate (CARAFATE) 1 g tablet Take 1 tablet by mouth twice daily 60 tablet 0   Vitamin D, Ergocalciferol, (DRISDOL) 1.25 MG (50000 UNIT) CAPS capsule Take 1 capsule (50,000 Units total) by mouth every 7 (seven) days. 5 capsule 4   No  facility-administered medications prior to visit.    Allergies  Allergen Reactions   Valium [Diazepam] Shortness Of Breath    Sob and bradycardia   Albuterol And Levalbuterol Palpitations    Episode of palpitations, tremor and anxiety when administered levalbuterol for PFT. Recommend avoiding this medicaiton class     ROS     Objective:    Physical Exam Constitutional:      General: She is not in acute distress.    Appearance: Normal appearance.  HENT:     Head: Normocephalic and atraumatic.     Right Ear: External ear normal.     Left Ear: External ear normal.  Eyes:     Extraocular Movements: Extraocular movements intact.     Pupils: Pupils are equal, round, and reactive to light.  Cardiovascular:     Rate and Rhythm: Normal rate and regular rhythm.     Heart sounds: Normal heart sounds. No murmur heard.    No gallop.  Pulmonary:     Effort: Pulmonary effort is normal. No respiratory distress.     Breath sounds: Normal breath sounds. No wheezing or rales.  Skin:    General: Skin is warm.  Neurological:     Mental Status: She is alert and oriented to person, place, and time.  Psychiatric:        Judgment: Judgment normal.     There were no vitals taken for this visit. Wt Readings from Last 3 Encounters:  08/26/22 113 lb 6.4 oz (51.4 kg)  08/17/22 112 lb 9.6 oz (51.1 kg)  08/16/22 113 lb (51.3 kg)       Assessment & Plan:  There are no diagnoses linked to this encounter.  I, Julie ShellKennedy C Lynn, personally preformed the services described in this documentation.  All medical record entries made by the scribe were at my direction and in my presence.  I have reviewed the chart and discharge instructions (if applicable) and agree that the record reflects my personal performance and is accurate and complete. 09/01/2022  Julie ShellKennedy C Lynn  [place] [facility],Kennedy C Lynn,acting as a scribe for Lemont FillersMelissa S O'Sullivan, NP.,have documented all relevant documentation on the behalf of Lemont FillersMelissa S O'Sullivan, NP,as directed by  Lemont FillersMelissa S O'Sullivan, NP while in the presence of Lemont FillersMelissa S O'Sullivan, NP.

## 2022-09-02 ENCOUNTER — Telehealth: Payer: Self-pay | Admitting: Primary Care

## 2022-09-02 NOTE — Telephone Encounter (Signed)
Faxed c-pap titration report from 08/16/22. Spoke with Dr. Myrtis Ser office and verified that they received fax. Nothing further needed at this time.

## 2022-09-02 NOTE — Telephone Encounter (Signed)
Dentist Dr. Hillary Bow and would like to have the PT sleep study fax'd over.  Fax: [phone]  She is in the chair rt now so sending back [facility].  Thank you.

## 2022-09-06 DIAGNOSIS — G4733 Obstructive sleep apnea (adult) (pediatric): Secondary | ICD-10-CM | POA: Diagnosis not present

## 2022-09-07 ENCOUNTER — Ambulatory Visit: Payer: Medicare HMO | Admitting: Urology

## 2022-09-08 ENCOUNTER — Encounter: Payer: Self-pay | Admitting: Urology

## 2022-09-08 ENCOUNTER — Ambulatory Visit: Payer: Medicare HMO | Admitting: Urology

## 2022-09-08 NOTE — Progress Notes (Deleted)
Assessment: 1. Urge incontinence   2. Urinary urgency   3. History of UTI   4. Post-menopausal atrophic vaginitis     Plan: Continue bladder diet  Agree with recommendations for pelvic floor physical therapy and PTNS. Return to office in 38-months  Chief Complaint:  No chief complaint on file.   History of Present Illness:  Julie Wilkerson is a 82 y.o. female who is seen for further evaluation of urinary urgency and urge incontinence.   At her initial visit on 04/28/22, she reported urinary symptoms for a number of years. Her symptoms include urinary frequency, urgency, nocturia, and urge incontinence.  She has been using incontinence pads, 1-2 many pads/day.  No dysuria or gross hematuria.  She had not tried any medical therapy for her symptoms. She reported a history of frequent UTIs.  Her last UTI was in July 2023.  Urine culture at that time grew 10-49 K Enterococcus. She reported fairly constant lower abdominal pain, worsened with the need to void.  She has previously been evaluated by GI.  She does have ongoing problems with diarrhea. CT imaging from January 2023 showed no renal or ureteral calculi, no evidence of obstruction, and an unremarkable bladder. She was previously evaluated by urogynecology in [facility].  She was seen in June 2023 for urinary incontinence and pelvic pain.  Bladder scan showed a volume of 65 mL.  Pelvic exam demonstrated no urethral hypermobility, tender bladder, atrophic changes, hypertonic pelvic floor, and no prolapse.  She was started on vaginal hormone cream.  She was also given a prescription for vaginal Valium suppositories and pelvic floor physical therapy was recommended.  She did not tolerate the vaginal Valium and did not proceed with physical therapy.  PVR from 12/23:  79 ml She was started on Myrbetriq 25 mg daily in 12/23. She was evaluated by Dr. Florian Buff with urogynecology and was referred for pelvic floor physical therapy. She also  started PTNS on 05/31/22.  At her visit in 1/24, she reported improvement in her frequency, urgency, and urge incontinence with the Myrbetriq.  She was not having any incontinence episodes while on the medication.  No reported side effects from the medication.  No dysuria or gross hematuria.   She was changed to Solifenacin in 2/24 due to reported side effects from the Myrbetriq including body aches, nausea, and abdominal pain. She was given some samples of Gemtesa in 2/24.  Portions of the above documentation were copied from a prior visit for review purposes only.    Past Medical History:  Past Medical History:  Diagnosis Date   Abdominal pain 11/07/2016   Accelerating angina (HCC) 02/09/2019   Acute diastolic CHF (congestive heart failure) (HCC) 02/16/2019   Acute on chronic diastolic CHF (congestive heart failure) (HCC) 02/23/2019   Acute pansinusitis 04/26/2017   Amblyopia of eye, left 04/19/2018   Anemia    Anginal pain (HCC)    Anxiety    Arthritis    Asthma    Atypical chest pain 02/04/2019   Bilateral carotid bruits 03/28/2017   Blood transfusion without reported diagnosis    Breast cancer (HCC)    2002, left, encapsulated, microcalcifications. lumpectomy, radtiation x 30   Breast cancer in female [facility])    Bursitis of left hip 09/04/2018   Change in bowel habits 02/22/2018   Change in mole 06/08/2016   Chest pain 03/04/2020   CHF (congestive heart failure) (HCC)    Chronic bilateral thoracic back pain 05/23/2016   Chronic  diastolic congestive heart failure (HCC) 05/28/2019   Colitis    Complication of anesthesia    VERY SENSITIVE TO ANESTHESIA    Constipation 01/17/2018   Coronary artery disease involving native coronary artery of native heart without angina pectoris 05/28/2019   Cortical age-related cataract of right eye 04/18/2018   Cough variant asthma  vs UACS from ACEi     Spirometry 12/02/2016  FEV1 1.26 (76%)  Ratio 78 with min curvature p last saba > 6 h  prior  - trial off acei 12/02/2016  - 12/02/2016  After extensive coaching HFA effectiveness =    75% with qvar autohaler > rechallenge with 80 2bid     Decreased visual acuity 01/16/2017   Degeneration of lumbar intervertebral disc 08/01/2018   Diarrhea 11/16/2015   D/o Diverticuli, polyps, celiac disease.     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   Left hand pain 05/07/2018   Left hip pain 03/01/2017   Left leg pain 03/01/2017   Left shoulder pain 05/07/2018   Low back pain 08/10/2015   LUQ pain 06/01/2019   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   Neck pain 03/01/2017   Nuclear sclerotic cataract of right eye 04/18/2018   Osteoporosis    Overweight (BMI 25.0-29.9) 02/14/2019   Pain in joint of left shoulder 08/11/2018   Paroxysmal atrial tachycardia 05/28/2019   Pelvic pain 07/02/2018   Personal history of radiation therapy    Pneumonia    PONV (postoperative nausea and vomiting)    Poor appetite 02/22/2018   Pseudophakia of left eye 04/18/2018   PVD (posterior vitreous detachment), right 04/24/2019   Restless sleeper 11/07/2016   Right hip pain 10/23/2017   RLQ discomfort 03/01/2017   RLS (restless legs syndrome) 05/07/2019   S/P left THA, AA 02/13/2019   Sepsis due to pneumonia (HCC) 02/16/2019   Sleep apnea    uses CPAP   Stomach cramps    Tremor 02/15/2016   Urinary frequency  12/22/2017   Urinary incontinence 05/07/2019   Urinary tract infection 11/04/2016   Vitamin D deficiency 04/29/2016    Past Surgical History:  Past Surgical History:  Procedure Laterality Date   ABDOMINAL HYSTERECTOMY     partial   APPENDECTOMY     BREAST LUMPECTOMY Left 2002   CHOLECYSTECTOMY  Age 2 or 41   COLONOSCOPY  2017   EYE SURGERY Left    cataract   LEFT HEART CATH AND CORONARY ANGIOGRAPHY N/A 02/09/2019   Procedure: LEFT HEART CATH AND CORONARY ANGIOGRAPHY;  Surgeon: Yvonne Kendall, MD;  Location: [facility];  Service: Cardiovascular;  Laterality: N/A;   TONSILLECTOMY     TOTAL HIP ARTHROPLASTY Left 02/13/2019   Procedure: TOTAL HIP ARTHROPLASTY ANTERIOR APPROACH;  Surgeon: Durene Romans, MD;  Location: [facility];  Service: Orthopedics;  Laterality: Left;  70 mins   UPPER GASTROINTESTINAL ENDOSCOPY  Allergies:  Allergies  Allergen Reactions   Valium [Diazepam] Shortness Of Breath    Sob and bradycardia   Albuterol And Levalbuterol Palpitations    Episode of palpitations, tremor and anxiety when administered levalbuterol for PFT. Recommend avoiding this medicaiton class    Family History:  Family History  Problem Relation Age of Onset   Colitis Mother    Irritable bowel syndrome Mother    Hypertension Father    Hyperlipidemia Sister    Hyperlipidemia Brother    Heart attack Brother    Hyperlipidemia Brother    Heart attack Brother    Hyperlipidemia Brother    Heart attack Brother    Stomach cancer Paternal Grandmother 79   Colon cancer Paternal Grandmother    Leukemia Daughter    Arthritis Daughter    Heart disease Daughter        ASD vs VSD   Esophageal cancer Neg Hx    Pancreatic cancer Neg Hx    Rectal cancer Neg Hx     Social History:  Social History   Tobacco Use   Smoking status: Never   Smokeless tobacco: Never  Vaping Use   Vaping Use: Never used  Substance Use Topics   Alcohol use: Never    Alcohol/week: 0.0 standard  drinks of alcohol   Drug use: Never    ROS: Constitutional:  Negative for fever, chills, weight loss CV: Negative for chest pain, previous MI, hypertension Respiratory:  Negative for shortness of breath, wheezing, sleep apnea, frequent cough GI:  Negative for nausea, vomiting, bloody stool, GERD  Physical exam: There were no vitals taken for this visit. GENERAL APPEARANCE:  Well appearing, well developed, well nourished, NAD HEENT:  Atraumatic, normocephalic, oropharynx clear NECK:  Supple without lymphadenopathy or thyromegaly ABDOMEN:  Soft, non-tender, no masses EXTREMITIES:  Moves all extremities well, without clubbing, cyanosis, or edema NEUROLOGIC:  Alert and oriented x 3, normal gait, CN II-XII grossly intact MENTAL STATUS:  appropriate BACK:  Non-tender to palpation, No CVAT SKIN:  Warm, dry, and intact  Results: U/A:

## 2022-09-17 ENCOUNTER — Encounter: Payer: Self-pay | Admitting: Urology

## 2022-09-17 ENCOUNTER — Ambulatory Visit: Payer: Medicare HMO | Admitting: Urology

## 2022-09-17 VITALS — BP 126/83 | HR 94 | Ht <= 58 in | Wt 112.0 lb

## 2022-09-17 DIAGNOSIS — Z87440 Personal history of urinary (tract) infections: Secondary | ICD-10-CM | POA: Diagnosis not present

## 2022-09-17 DIAGNOSIS — R3915 Urgency of urination: Secondary | ICD-10-CM | POA: Diagnosis not present

## 2022-09-17 DIAGNOSIS — N3941 Urge incontinence: Secondary | ICD-10-CM | POA: Diagnosis not present

## 2022-09-17 LAB — MICROSCOPIC EXAMINATION
Cast Type: NONE SEEN
Casts: NONE SEEN /lpf
Crystal Type: NONE SEEN
Crystals: NONE SEEN
Mucus, UA: NONE SEEN
RBC, Urine: NONE SEEN /hpf (ref 0–2)
Trichomonas, UA: NONE SEEN
WBC, UA: NONE SEEN /hpf (ref 0–5)
Yeast, UA: NONE SEEN

## 2022-09-17 LAB — URINALYSIS, ROUTINE W REFLEX MICROSCOPIC
Bilirubin, UA: NEGATIVE
Glucose, UA: NEGATIVE
Ketones, UA: NEGATIVE
Leukocytes,UA: NEGATIVE
Nitrite, UA: NEGATIVE
Protein,UA: NEGATIVE
Specific Gravity, UA: 1.02 (ref 1.005–1.030)
Urobilinogen, Ur: 0.2 mg/dL (ref 0.2–1.0)
pH, UA: 5.5 (ref 5.0–7.5)

## 2022-09-17 NOTE — Progress Notes (Signed)
Assessment: 1. Urge incontinence   2. Urinary urgency   3. History of UTI     Plan: Continue bladder diet  I recommended that she follow-up with urogynecology as they are working on approval for Botox injections. Since she is seeing urogynecology for her incontinence symptoms, she does not need to follow-up with me at the present time. Follow-up as needed.  Chief Complaint:  Chief Complaint  Patient presents with   Urinary Incontinence    History of Present Illness:  Julie Wilkerson is a 82 y.o. female who is seen for further evaluation of urinary urgency and urge incontinence.   At her initial visit on 04/28/22, she reported urinary symptoms for a number of years. Her symptoms include urinary frequency, urgency, nocturia, and urge incontinence.  She has been using incontinence pads, 1-2 many pads/day.  No dysuria or gross hematuria.  She had not tried any medical therapy for her symptoms. She reported a history of frequent UTIs.  Her last UTI was in July 2023.  Urine culture at that time grew 10-49 K Enterococcus. She reported fairly constant lower abdominal pain, worsened with the need to void.  She has previously been evaluated by GI.  She does have ongoing problems with diarrhea. CT imaging from January 2023 showed no renal or ureteral calculi, no evidence of obstruction, and an unremarkable bladder. She was previously evaluated by urogynecology in [facility].  She was seen in June 2023 for urinary incontinence and pelvic pain.  Bladder scan showed a volume of 65 mL.  Pelvic exam demonstrated no urethral hypermobility, tender bladder, atrophic changes, hypertonic pelvic floor, and no prolapse.  She was started on vaginal hormone cream.  She was also given a prescription for vaginal Valium suppositories and pelvic floor physical therapy was recommended.  She did not tolerate the vaginal Valium and did not proceed with physical therapy.  PVR from 12/23:  79 ml She was started on  Myrbetriq 25 mg daily in 12/23. She was evaluated by Dr. Florian Buff with urogynecology and was referred for pelvic floor physical therapy. She also started PTNS on 05/31/22.  At her visit in 1/24, she reported improvement in her frequency, urgency, and urge incontinence with the Myrbetriq.  She was not having any incontinence episodes while on the medication.  No reported side effects from the medication.  No dysuria or gross hematuria.   She was changed to Solifenacin in 2/24 due to reported side effects from the Myrbetriq including body aches, nausea, and abdominal pain. She was given some samples of Gemtesa in 2/24. She was given a prescription for Solifenacin.  It is unclear if she is currently taking this.  She returns today for scheduled follow-up.  She reports some improvement in her symptoms.  She continues with frequency and urgency.  She has only had 1 episode of incontinence recently.  She is being evaluated for botulinum injections by urogynecology.  She is awaiting approval for this procedure.  She has apparently been taught self-catheterization.  Portions of the above documentation were copied from a prior visit for review purposes only.    Past Medical History:  Past Medical History:  Diagnosis Date   Abdominal pain 11/07/2016   Accelerating angina (HCC) 02/09/2019   Acute diastolic CHF (congestive heart failure) (HCC) 02/16/2019   Acute on chronic diastolic CHF (congestive heart failure) (HCC) 02/23/2019   Acute pansinusitis 04/26/2017   Amblyopia of eye, left 04/19/2018   Anemia    Anginal pain (HCC)  Anxiety    Arthritis    Asthma    Atypical chest pain 02/04/2019   Bilateral carotid bruits 03/28/2017   Blood transfusion without reported diagnosis    Breast cancer (HCC)    2002, left, encapsulated, microcalcifications. lumpectomy, radtiation x 30   Breast cancer in female [facility])    Bursitis of left hip 09/04/2018   Change in bowel habits 02/22/2018   Change in  mole 06/08/2016   Chest pain 03/04/2020   CHF (congestive heart failure) (HCC)    Chronic bilateral thoracic back pain 05/23/2016   Chronic diastolic congestive heart failure (HCC) 05/28/2019   Colitis    Complication of anesthesia    VERY SENSITIVE TO ANESTHESIA    Constipation 01/17/2018   Coronary artery disease involving native coronary artery of native heart without angina pectoris 05/28/2019   Cortical age-related cataract of right eye 04/18/2018   Cough variant asthma  vs UACS from ACEi     Spirometry 12/02/2016  FEV1 1.26 (76%)  Ratio 78 with min curvature p last saba > 6 h prior  - trial off acei 12/02/2016  - 12/02/2016  After extensive coaching HFA effectiveness =    75% with qvar autohaler > rechallenge with 80 2bid     Decreased visual acuity 01/16/2017   Degeneration of lumbar intervertebral disc 08/01/2018   Diarrhea 11/16/2015   D/o Diverticuli, polyps, celiac disease.     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   Left hand pain 05/07/2018   Left hip pain 03/01/2017   Left leg pain 03/01/2017   Left shoulder pain 05/07/2018   Low back pain 08/10/2015   LUQ pain 06/01/2019   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   Neck pain 03/01/2017   Nuclear sclerotic cataract of right eye 04/18/2018   Osteoporosis    Overweight (BMI 25.0-29.9) 02/14/2019   Pain in joint of left shoulder 08/11/2018    Paroxysmal atrial tachycardia 05/28/2019   Pelvic pain 07/02/2018   Personal history of radiation therapy    Pneumonia    PONV (postoperative nausea and vomiting)    Poor appetite 02/22/2018   Pseudophakia of left eye 04/18/2018   PVD (posterior vitreous detachment), right 04/24/2019   Restless sleeper 11/07/2016   Right hip pain 10/23/2017   RLQ discomfort 03/01/2017   RLS (restless legs syndrome) 05/07/2019   S/P left THA, AA 02/13/2019   Sepsis due to pneumonia (HCC) 02/16/2019   Sleep apnea    uses CPAP   Stomach cramps    Tremor 02/15/2016   Urinary frequency 12/22/2017   Urinary incontinence 05/07/2019   Urinary tract infection 11/04/2016   Vitamin D deficiency 04/29/2016    Past Surgical History:  Past Surgical History:  Procedure Laterality Date   ABDOMINAL HYSTERECTOMY     partial   APPENDECTOMY     BREAST LUMPECTOMY Left 2002   CHOLECYSTECTOMY  Age  31 or 41   COLONOSCOPY  2017   EYE SURGERY Left    cataract   LEFT HEART CATH AND CORONARY ANGIOGRAPHY N/A 02/09/2019   Procedure: LEFT HEART CATH AND CORONARY ANGIOGRAPHY;  Surgeon: Yvonne Kendall, MD;  Location: [facility];  Service: Cardiovascular;  Laterality: N/A;   TONSILLECTOMY     TOTAL HIP ARTHROPLASTY Left 02/13/2019   Procedure: TOTAL HIP ARTHROPLASTY ANTERIOR APPROACH;  Surgeon: Durene Romans, MD;  Location: [facility];  Service: Orthopedics;  Laterality: Left;  70 mins   UPPER GASTROINTESTINAL ENDOSCOPY      Allergies:  Allergies  Allergen Reactions   Valium [Diazepam] Shortness Of Breath    Sob and bradycardia   Albuterol And Levalbuterol Palpitations    Episode of palpitations, tremor and anxiety when administered levalbuterol for PFT. Recommend avoiding this medicaiton class    Family History:  Family History  Problem Relation Age of Onset   Colitis Mother    Irritable bowel syndrome Mother    Hypertension Father    Hyperlipidemia Sister    Hyperlipidemia Brother    Heart attack  Brother    Hyperlipidemia Brother    Heart attack Brother    Hyperlipidemia Brother    Heart attack Brother    Stomach cancer Paternal Grandmother 96   Colon cancer Paternal Grandmother    Leukemia Daughter    Arthritis Daughter    Heart disease Daughter        ASD vs VSD   Esophageal cancer Neg Hx    Pancreatic cancer Neg Hx    Rectal cancer Neg Hx     Social History:  Social History   Tobacco Use   Smoking status: Never   Smokeless tobacco: Never  Vaping Use   Vaping Use: Never used  Substance Use Topics   Alcohol use: Never    Alcohol/week: 0.0 standard drinks of alcohol   Drug use: Never    ROS: Constitutional:  Negative for fever, chills, weight loss CV: Negative for chest pain, previous MI, hypertension Respiratory:  Negative for shortness of breath, wheezing, sleep apnea, frequent cough GI:  Negative for nausea, vomiting, bloody stool, GERD  Physical exam: BP 126/83   Pulse 94   Ht 4\' 10"  (1.473 m)   Wt 112 lb (50.8 kg)   BMI 23.41 kg/m  GENERAL APPEARANCE:  Well appearing, well developed, well nourished, NAD HEENT:  Atraumatic, normocephalic, oropharynx clear NECK:  Supple without lymphadenopathy or thyromegaly ABDOMEN:  Soft, non-tender, no masses EXTREMITIES:  Moves all extremities well, without clubbing, cyanosis, or edema NEUROLOGIC:  Alert and oriented x 3, normal gait, CN II-XII grossly intact MENTAL STATUS:  appropriate BACK:  Non-tender to palpation, No CVAT SKIN:  Warm, dry, and intact  Results: U/A: few bacteria

## 2022-09-20 ENCOUNTER — Telehealth: Payer: Self-pay | Admitting: Obstetrics and Gynecology

## 2022-09-20 NOTE — Telephone Encounter (Signed)
Called patient to schedule botox and discuss symptoms. She reports that she is not really having the leakage and urgency like she was previously. Maybe leaking once per week. She feels she goes frequently if she drinks a lot of water. We discussed that if she has had improvement in her symptoms then it is best to defer the botox at this time. Will have her follow up in a few months.   Marguerita Beards, MD

## 2022-09-23 DIAGNOSIS — H52223 Regular astigmatism, bilateral: Secondary | ICD-10-CM | POA: Diagnosis not present

## 2022-09-23 DIAGNOSIS — H524 Presbyopia: Secondary | ICD-10-CM | POA: Diagnosis not present

## 2022-10-31 ENCOUNTER — Other Ambulatory Visit: Payer: Self-pay | Admitting: Gastroenterology

## 2022-11-06 ENCOUNTER — Other Ambulatory Visit: Payer: Self-pay | Admitting: Family Medicine

## 2022-11-09 ENCOUNTER — Ambulatory Visit (HOSPITAL_BASED_OUTPATIENT_CLINIC_OR_DEPARTMENT_OTHER)
Admission: RE | Admit: 2022-11-09 | Discharge: 2022-11-09 | Disposition: A | Discharge status: Home / Self Care | Payer: Medicare HMO | Source: Ambulatory Visit | Attending: Medical | Admitting: Medical

## 2022-11-09 ENCOUNTER — Ambulatory Visit (INDEPENDENT_AMBULATORY_CARE_PROVIDER_SITE_OTHER): Payer: Medicare HMO | Admitting: Medical

## 2022-11-09 VITALS — BP 130/68 | HR 75 | Temp 98.0°F | Resp 18 | Ht <= 58 in | Wt 112.8 lb

## 2022-11-09 DIAGNOSIS — R0781 Pleurodynia: Secondary | ICD-10-CM | POA: Diagnosis not present

## 2022-11-09 DIAGNOSIS — R5383 Other fatigue: Secondary | ICD-10-CM

## 2022-11-09 DIAGNOSIS — I509 Heart failure, unspecified: Secondary | ICD-10-CM | POA: Insufficient documentation

## 2022-11-09 DIAGNOSIS — G629 Polyneuropathy, unspecified: Secondary | ICD-10-CM | POA: Diagnosis not present

## 2022-11-09 DIAGNOSIS — I10 Essential (primary) hypertension: Secondary | ICD-10-CM

## 2022-11-09 DIAGNOSIS — R918 Other nonspecific abnormal finding of lung field: Secondary | ICD-10-CM | POA: Diagnosis not present

## 2022-11-09 DIAGNOSIS — E785 Hyperlipidemia, unspecified: Secondary | ICD-10-CM | POA: Diagnosis not present

## 2022-11-09 NOTE — Progress Notes (Signed)
Subjective:    Patient ID: Julie Wilkerson, female    DOB: December 06, 1940, 82 y.o.   MRN: [id number]  HPI  Pt in for follow up.  Pt states she got letter to follow up but pt not sure why specifically?  She states she thinks she need diuretic. Pt last cxr  08-26-2022. Pt states put in refill request for lasix about 2 weeks ago but not filled yet.  IMPRESSION: 1. No active cardiopulmonary disease. 2. Unchanged bilateral mid and lower lung linear scarring.  Pt has hx of chf. Pt most recent lasix on review appears to have been out of lasix 40 mg for 2 weeks.    Last echo 12-2020 and ef was 60--65%  No sob, no orthopnea and on pedal edema.  Htn- pt bp is well controlled. Pt states no longer on lisinopril. Also she states stopped her metoprolol. On review at one point it appears b blocker held held by pulmonlogist.    Review of Systems  Constitutional:  Positive for fatigue. Negative for chills and fever.  HENT:  Negative for congestion.   Respiratory:  Negative for cough, chest tightness, shortness of breath and wheezing.   Cardiovascular:  Negative for chest pain and palpitations.  Gastrointestinal:  Negative for abdominal pain.  Genitourinary:  Negative for dysuria.  Musculoskeletal:  Negative for back pain.  Skin:  Negative for rash.  Neurological:  Positive for dizziness. Negative for headaches.       Neuropathy upper and lower ext.  Hematological:  Positive for adenopathy. Bruises/bleeds easily.  Psychiatric/Behavioral:  Negative for behavioral problems.     Past Medical History:  Diagnosis Date   Abdominal pain 11/07/2016   Accelerating angina (HCC) 02/09/2019   Acute diastolic CHF (congestive heart failure) (HCC) 02/16/2019   Acute on chronic diastolic CHF (congestive heart failure) (HCC) 02/23/2019   Acute pansinusitis 04/26/2017   Amblyopia of eye, left 04/19/2018   Anemia    Anginal pain (HCC)    Anxiety    Arthritis    Asthma    Atypical chest pain 02/04/2019    Bilateral carotid bruits 03/28/2017   Blood transfusion without reported diagnosis    Breast cancer (HCC)    2002, left, encapsulated, microcalcifications. lumpectomy, radtiation x 30   Breast cancer in female [facility])    Bursitis of left hip 09/04/2018   Change in bowel habits 02/22/2018   Change in mole 06/08/2016   Chest pain 03/04/2020   CHF (congestive heart failure) (HCC)    Chronic bilateral thoracic back pain 05/23/2016   Chronic diastolic congestive heart failure (HCC) 05/28/2019   Colitis    Complication of anesthesia    VERY SENSITIVE TO ANESTHESIA    Constipation 01/17/2018   Coronary artery disease involving native coronary artery of native heart without angina pectoris 05/28/2019   Cortical age-related cataract of right eye 04/18/2018   Cough variant asthma  vs UACS from ACEi     Spirometry 12/02/2016  FEV1 1.26 (76%)  Ratio 78 with min curvature p last saba > 6 h prior  - trial off acei 12/02/2016  - 12/02/2016  After extensive coaching HFA effectiveness =    75% with qvar autohaler > rechallenge with 80 2bid     Decreased visual acuity 01/16/2017   Degeneration of lumbar intervertebral disc 08/01/2018   Diarrhea 11/16/2015   D/o Diverticuli, polyps, celiac disease.     Dyspnea 11/04/2016   with asthma exacerbation only   Dysuria 11/04/2016   Elevated sed rate  10/23/2017   Essential hypertension    Trial off acei 12/02/2016 due to cough/ pseudoasthma   GERD (gastroesophageal reflux disease)    Gluten intolerance    History of chicken pox    History of Helicobacter pylori infection 08/01/2015   History of rectal bleeding 06/01/2019   Hx of LASIK 04/24/2019   Hyperglycemia 11/07/2016   Hyperlipidemia    Hypertension    Hyponatremia 02/16/2019   Hypothyroidism    IBS (irritable bowel syndrome)    Lattice degeneration of right retina 04/24/2019   Left hand pain 05/07/2018   Left hip pain 03/01/2017   Left leg pain 03/01/2017   Left shoulder pain 05/07/2018   Low  back pain 08/10/2015   LUQ pain 06/01/2019   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   Neck pain 03/01/2017   Nuclear sclerotic cataract of right eye 04/18/2018   Osteoporosis    Overweight (BMI 25.0-29.9) 02/14/2019   Pain in joint of left shoulder 08/11/2018   Paroxysmal atrial tachycardia 05/28/2019   Pelvic pain 07/02/2018   Personal history of radiation therapy    Pneumonia    PONV (postoperative nausea and vomiting)    Poor appetite 02/22/2018   Pseudophakia of left eye 04/18/2018   PVD (posterior vitreous detachment), right 04/24/2019   Restless sleeper 11/07/2016   Right hip pain 10/23/2017   RLQ discomfort 03/01/2017   RLS (restless legs syndrome) 05/07/2019   S/P left THA, AA 02/13/2019   Sepsis due to pneumonia (HCC) 02/16/2019   Sleep apnea    uses CPAP   Stomach cramps    Tremor 02/15/2016   Urinary frequency 12/22/2017   Urinary incontinence 05/07/2019   Urinary tract infection 11/04/2016   Vitamin D deficiency 04/29/2016     Social History   Socioeconomic History   Marital status: Single    Spouse name: Not on file   Number of children: 3   Years of education: 18   Highest education level: Master's degree (e.g., MA, MS, MEng, MEd, MSW, MBA)  Occupational History   Occupation: retired    Comment: Runner, broadcasting/film/video, kindergarten  Tobacco Use   Smoking status: Never   Smokeless tobacco: Never  Vaping Use   Vaping Use: Never used  Substance and Sexual Activity   Alcohol use: Never    Alcohol/week: 0.0 standard drinks of alcohol   Drug use: Never   Sexual activity: Not Currently    Birth control/protection: None    Comment: lives alone, avoids daiiry and gluten. 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  Other Topics Concern   Not on file  Social History Narrative   Not on file   Social Determinants of Health   Financial Resource Strain: Low Risk  (12/19/2020)   Overall Financial Resource Strain (CARDIA)    Difficulty of Paying  Living Expenses: Not hard at all  Food Insecurity: No Food Insecurity (04/28/2022)   Hunger Vital Sign    Worried About Running Out of Food in the Last Year: Never true    Ran Out of Food in the Last Year: Never true  Transportation Needs: No Transportation Needs (04/28/2022)   PRAPARE - Administrator, Civil Service (Medical): No    Lack of Transportation (Non-Medical): No  Physical Activity: Insufficiently Active (12/19/2020)   Exercise Vital Sign    Days of Exercise per Week: 1 day    Minutes of Exercise per Session: 30 min  Stress: No Stress Concern Present (12/19/2020)   [employer] of Occupational Health -  Occupational Stress Questionnaire    Feeling of Stress : Not at all  Social Connections: Moderately Integrated (03/29/2022)   Social Connection and Isolation Panel [NHANES]    Frequency of Communication with Friends and Family: More than three times a week    Frequency of Social Gatherings with Friends and Family: More than three times a week    Attends Religious Services: More than 4 times per year    Active Member of [employer] or Organizations: Yes    Attends Engineer, structural: More than 4 times per year    Marital Status: Divorced  Intimate Partner Violence: Not At Risk (12/19/2020)   Humiliation, Afraid, Rape, and Kick questionnaire    Fear of Current or Ex-Partner: No    Emotionally Abused: No    Physically Abused: No    Sexually Abused: No    Past Surgical History:  Procedure Laterality Date   ABDOMINAL HYSTERECTOMY     partial   APPENDECTOMY     BREAST LUMPECTOMY Left 2002   CHOLECYSTECTOMY  Age 57 or 41   COLONOSCOPY  2017   EYE SURGERY Left    cataract   LEFT HEART CATH AND CORONARY ANGIOGRAPHY N/A 02/09/2019   Procedure: LEFT HEART CATH AND CORONARY ANGIOGRAPHY;  Surgeon: Yvonne Kendall, MD;  Location: [facility];  Service: Cardiovascular;  Laterality: N/A;   TONSILLECTOMY     TOTAL HIP ARTHROPLASTY Left 02/13/2019    Procedure: TOTAL HIP ARTHROPLASTY ANTERIOR APPROACH;  Surgeon: Durene Romans, MD;  Location: [facility];  Service: Orthopedics;  Laterality: Left;  70 mins   UPPER GASTROINTESTINAL ENDOSCOPY      Family History  Problem Relation Age of Onset   Colitis Mother    Irritable bowel syndrome Mother    Hypertension Father    Hyperlipidemia Sister    Hyperlipidemia Brother    Heart attack Brother    Hyperlipidemia Brother    Heart attack Brother    Hyperlipidemia Brother    Heart attack Brother    Stomach cancer Paternal Grandmother 46   Colon cancer Paternal Grandmother    Leukemia Daughter    Arthritis Daughter    Heart disease Daughter        ASD vs VSD   Esophageal cancer Neg Hx    Pancreatic cancer Neg Hx    Rectal cancer Neg Hx     Allergies  Allergen Reactions   Valium [Diazepam] Shortness Of Breath    Sob and bradycardia   Albuterol And Levalbuterol Palpitations    Episode of palpitations, tremor and anxiety when administered levalbuterol for PFT. Recommend avoiding this medicaiton class    Current Outpatient Medications on File Prior to Visit  Medication Sig Dispense Refill   ASPIRIN 81 PO 81 mg daily.      citalopram (CELEXA) 20 MG tablet TAKE 1 TABLET BY MOUTH AT BEDTIME 90 tablet 0   doxycycline (VIBRAMYCIN) 100 MG capsule Take 1 capsule (100 mg total) by mouth 2 (two) times daily. 20 capsule 0   fexofenadine (ALLEGRA ALLERGY) 180 MG tablet Take 1 tablet (180 mg total) by mouth daily as needed for allergies or rhinitis.     FLOVENT HFA 110 MCG/ACT inhaler INHALE 1 TO 2 PUFFS BY MOUTH TWICE DAILY AS NEEDED 12 g 0   hyoscyamine (LEVSIN SL) 0.125 MG SL tablet Use at least twice a day 90 tablet 1   levothyroxine (SYNTHROID) 50 MCG tablet Take 1 tablet (50 mcg total) by mouth daily before breakfast. 90 tablet  1   omega-3 acid ethyl esters (LOVAZA) 1 g capsule Take 2 g by mouth daily.     omeprazole (PRILOSEC) 40 MG capsule Take 1 capsule (40 mg total) by mouth 2 (two) times  daily. 60 capsule 5   Peppermint Oil (IBGARD) 90 MG CPCR Use as directed     potassium chloride (KLOR-CON M) 10 MEQ tablet Take 1 tablet (10 mEq total) by mouth daily. 30 tablet 0   predniSONE (DELTASONE) 20 MG tablet Take 40 mg by mouth daily for 3 days then 20 mg daily for 3 days 9 tablet 0   sucralfate (CARAFATE) 1 g tablet Take 1 tablet by mouth twice daily 60 tablet 0   Vitamin D, Ergocalciferol, (DRISDOL) 1.25 MG (50000 UNIT) CAPS capsule Take 1 capsule (50,000 Units total) by mouth every 7 (seven) days. 5 capsule 4   No current facility-administered medications on file prior to visit.    BP 130/68   Pulse 75   Temp 98 F (36.7 C)   Resp 18   Ht 4\' 10"  (1.473 m)   Wt 112 lb 12.8 oz (51.2 kg)   SpO2 95%   BMI 23.58 kg/m        Objective:   Physical Exam  General Mental Status- Alert. General Appearance- Not in acute distress.   Skin General: Color- Normal Color. Moisture- Normal Moisture.  Neck Carotid Arteries- Normal color. Moisture- Normal Moisture. No carotid bruits. No JVD.  Chest and Lung Exam Auscultation: Breath Sounds:-Normal.  Cardiovascular Auscultation:Rythm- Regular. Murmurs & Other Heart Sounds:Auscultation of the heart reveals- No Murmurs.  Abdomen Inspection:-Inspeection Normal. Palpation/Percussion:Note:No mass. Palpation and Percussion of the abdomen reveal- Non Tender, Non Distended + BS, no rebound or guarding.    Neurologic Cranial Nerve exam:- CN III-XII intact(No nystagmus), symmetric smile. Strength:- 5/5 equal and symmetric strength both upper and lower extremities.   Lower ext- faint 1+ pedal edema at best. Calfs symmetric. Negative homans signs.  Left mid- axillary rib pain on palpation. Lower rib mid axillary area.     Assessment & Plan:   Patient Instructions  1. Congestive heart failure, unspecified HF chronicity, unspecified heart failure type (HCC) Prior use of lasix 40 mg daily. Want to review labs before planning to  fill med/determine. - Comp Met (CMET) - B Nat Peptide - DG Chest 2 View; Future  2. Hypertension, unspecified type Bp good without lisinopril. Also rate controlled even without metoprolol.   3. Fatigue, unspecified type Will follow labs for fatigue - CBC w/Diff - B12 - Vitamin B1 - TSH - T4, free  4. Neuropathy -b12 -b1 - TSH - T4, free   5. High choleserol- recheck lipid panel today.  Follow up date to be determined after lab review.   Esperanza Richters, PA-C

## 2022-11-09 NOTE — Patient Instructions (Addendum)
1. Congestive heart failure, unspecified HF chronicity, unspecified heart failure type (HCC) Prior use of lasix 40 mg daily. Want to review labs before planning to fill med/determine. - Comp Met (CMET) - B Nat Peptide - DG Chest 2 View; Future  2. Hypertension, unspecified type Bp good without lisinopril. Also rate controlled even without metoprolol.   3. Fatigue, unspecified type Will follow labs for fatigue - CBC w/Diff - B12 - Vitamin B1 - TSH - T4, free  4. Neuropathy -b12 -b1 - TSH - T4, free   5. High cholesterol- recheck lipid panel today.  Follow up date to be determined after lab review.

## 2022-11-10 LAB — LIPID PANEL
Cholesterol: 261 mg/dL — ABNORMAL HIGH (ref 0–200)
HDL: 60.9 mg/dL (ref >39.00)
LDL Cholesterol: 165 mg/dL — ABNORMAL HIGH (ref 0–99)
NonHDL: 199.63
Total CHOL/HDL Ratio: 4
Triglycerides: 173 mg/dL — ABNORMAL HIGH (ref 0.0–149.0)
VLDL: 34.6 mg/dL (ref 0.0–40.0)

## 2022-11-10 LAB — COMPREHENSIVE METABOLIC PANEL
ALT: 11 U/L (ref 0–35)
AST: 21 U/L (ref 0–37)
Albumin: 4.1 g/dL (ref 3.5–5.2)
Alkaline Phosphatase: 84 U/L (ref 39–117)
BUN: 27 mg/dL — ABNORMAL HIGH (ref 6–23)
CO2: 28 mEq/L (ref 19–32)
Calcium: 8.8 mg/dL (ref 8.4–10.5)
Chloride: 102 mEq/L (ref 96–112)
Creatinine, Ser: 1.23 mg/dL — ABNORMAL HIGH (ref 0.40–1.20)
GFR: 41.37 mL/min — ABNORMAL LOW (ref >60.00)
Glucose, Bld: 92 mg/dL (ref 70–99)
Potassium: 4 mEq/L (ref 3.5–5.1)
Sodium: 140 mEq/L (ref 135–145)
Total Bilirubin: 0.5 mg/dL (ref 0.2–1.2)
Total Protein: 7 g/dL (ref 6.0–8.3)

## 2022-11-10 LAB — CBC WITH DIFFERENTIAL/PLATELET
Basophils Absolute: 0.1 10*3/uL (ref 0.0–0.1)
Basophils Relative: 1 % (ref 0.0–3.0)
Eosinophils Absolute: 0 10*3/uL (ref 0.0–0.7)
Eosinophils Relative: 0.1 % (ref 0.0–5.0)
HCT: 34.7 % — ABNORMAL LOW (ref 36.0–46.0)
Hemoglobin: 11.4 g/dL — ABNORMAL LOW (ref 12.0–15.0)
Lymphocytes Relative: 40.2 % (ref 12.0–46.0)
Lymphs Abs: 2.1 10*3/uL (ref 0.7–4.0)
MCHC: 33 g/dL (ref 30.0–36.0)
MCV: 87.6 fl (ref 78.0–100.0)
Monocytes Absolute: 0.7 10*3/uL (ref 0.1–1.0)
Monocytes Relative: 12.8 % — ABNORMAL HIGH (ref 3.0–12.0)
Neutro Abs: 2.4 10*3/uL (ref 1.4–7.7)
Neutrophils Relative %: 45.9 % (ref 43.0–77.0)
Platelets: 176 10*3/uL (ref 150.0–400.0)
RBC: 3.96 Mil/uL (ref 3.87–5.11)
RDW: 14.8 % (ref 11.5–15.5)
WBC: 5.1 10*3/uL (ref 4.0–10.5)

## 2022-11-10 LAB — BRAIN NATRIURETIC PEPTIDE: Pro B Natriuretic peptide (BNP): 92 pg/mL (ref 0.0–100.0)

## 2022-11-10 LAB — TSH: TSH: 1.9 u[IU]/mL (ref 0.35–5.50)

## 2022-11-10 LAB — T4, FREE: Free T4: 1.04 ng/dL (ref 0.60–1.60)

## 2022-11-10 LAB — VITAMIN B12: Vitamin B-12: 869 pg/mL (ref 211–911)

## 2022-11-10 MED ORDER — FUROSEMIDE 40 MG PO TABS: ORAL_TABLET | ORAL | 3 refills | Status: DC

## 2022-11-10 NOTE — Addendum Note (Signed)
Addended by: Gwenevere Abbot on: 11/10/2022 12:58 PM   Modules accepted: Orders

## 2022-11-12 LAB — VITAMIN B1: Vitamin B1 (Thiamine): 25 nmol/L (ref 8–30)

## 2022-11-18 DIAGNOSIS — H532 Diplopia: Secondary | ICD-10-CM | POA: Diagnosis not present

## 2022-11-21 ENCOUNTER — Other Ambulatory Visit: Payer: Self-pay | Admitting: Family Medicine

## 2022-11-22 MED ORDER — CITALOPRAM HYDROBROMIDE 20 MG PO TABS: 20.0000 mg | ORAL_TABLET | Freq: Every day | ORAL | 0 refills | Status: DC

## 2022-12-04 DIAGNOSIS — N399 Disorder of urinary system, unspecified: Secondary | ICD-10-CM | POA: Diagnosis not present

## 2022-12-08 ENCOUNTER — Ambulatory Visit (INDEPENDENT_AMBULATORY_CARE_PROVIDER_SITE_OTHER): Payer: Medicare HMO | Admitting: Family

## 2022-12-08 VITALS — BP 138/66 | HR 66 | Temp 97.8°F | Resp 16 | Wt 114.0 lb

## 2022-12-08 DIAGNOSIS — K529 Noninfective gastroenteritis and colitis, unspecified: Secondary | ICD-10-CM

## 2022-12-08 DIAGNOSIS — J029 Acute pharyngitis, unspecified: Secondary | ICD-10-CM | POA: Diagnosis not present

## 2022-12-08 DIAGNOSIS — U071 COVID-19: Secondary | ICD-10-CM | POA: Diagnosis not present

## 2022-12-08 DIAGNOSIS — R300 Dysuria: Secondary | ICD-10-CM

## 2022-12-08 DIAGNOSIS — R1012 Left upper quadrant pain: Secondary | ICD-10-CM

## 2022-12-08 LAB — CBC WITH DIFFERENTIAL/PLATELET
Basophils Absolute: 0.1 10*3/uL (ref 0.0–0.1)
Basophils Relative: 0.9 % (ref 0.0–3.0)
Eosinophils Absolute: 0 10*3/uL (ref 0.0–0.7)
Eosinophils Relative: 0 % (ref 0.0–5.0)
HCT: 32.8 % — ABNORMAL LOW (ref 36.0–46.0)
Hemoglobin: 10.9 g/dL — ABNORMAL LOW (ref 12.0–15.0)
Lymphocytes Relative: 11.6 % — ABNORMAL LOW (ref 12.0–46.0)
Lymphs Abs: 0.8 10*3/uL (ref 0.7–4.0)
MCHC: 33.1 g/dL (ref 30.0–36.0)
MCV: 87.7 fl (ref 78.0–100.0)
Monocytes Absolute: 0.8 10*3/uL (ref 0.1–1.0)
Monocytes Relative: 11.3 % (ref 3.0–12.0)
Neutro Abs: 5.3 10*3/uL (ref 1.4–7.7)
Neutrophils Relative %: 76.2 % (ref 43.0–77.0)
Platelets: 229 10*3/uL (ref 150.0–400.0)
RBC: 3.74 Mil/uL — ABNORMAL LOW (ref 3.87–5.11)
RDW: 15 % (ref 11.5–15.5)
WBC: 7 10*3/uL (ref 4.0–10.5)

## 2022-12-08 LAB — POC URINALSYSI DIPSTICK (AUTOMATED)
Bilirubin, UA: NEGATIVE
Blood, UA: NEGATIVE
Glucose, UA: NEGATIVE
Ketones, UA: NEGATIVE
Nitrite, UA: NEGATIVE
Protein, UA: POSITIVE — AB
Spec Grav, UA: 1.01 (ref 1.010–1.025)
Urobilinogen, UA: 0.2 E.U./dL
pH, UA: 6.5 (ref 5.0–8.0)

## 2022-12-08 LAB — POCT RAPID STREP A (OFFICE): Rapid Strep A Screen: NEGATIVE

## 2022-12-08 LAB — COMPREHENSIVE METABOLIC PANEL
ALT: 11 U/L (ref 0–35)
AST: 17 U/L (ref 0–37)
Albumin: 4.1 g/dL (ref 3.5–5.2)
Alkaline Phosphatase: 94 U/L (ref 39–117)
BUN: 28 mg/dL — ABNORMAL HIGH (ref 6–23)
CO2: 25 mEq/L (ref 19–32)
Calcium: 9 mg/dL (ref 8.4–10.5)
Chloride: 103 mEq/L (ref 96–112)
Creatinine, Ser: 1.19 mg/dL (ref 0.40–1.20)
GFR: 43.02 mL/min — ABNORMAL LOW (ref >60.00)
Glucose, Bld: 107 mg/dL — ABNORMAL HIGH (ref 70–99)
Potassium: 4.2 mEq/L (ref 3.5–5.1)
Sodium: 136 mEq/L (ref 135–145)
Total Bilirubin: 0.5 mg/dL (ref 0.2–1.2)
Total Protein: 7 g/dL (ref 6.0–8.3)

## 2022-12-08 LAB — POC COVID19 BINAXNOW: SARS Coronavirus 2 Ag: POSITIVE — AB

## 2022-12-08 MED ORDER — LIDOCAINE VISCOUS HCL 2 % MT SOLN
15.0000 mL | Freq: Four times a day (QID) | OROMUCOSAL | 0 refills | Status: DC | PRN
Start: 2022-12-08 — End: 2023-01-18

## 2022-12-08 MED ORDER — CIPROFLOXACIN HCL 500 MG PO TABS: 500.0000 mg | ORAL_TABLET | Freq: Two times a day (BID) | ORAL | 0 refills | Status: AC

## 2022-12-08 MED ORDER — METRONIDAZOLE 500 MG PO TABS: 500.0000 mg | ORAL_TABLET | Freq: Two times a day (BID) | ORAL | 0 refills | Status: AC

## 2022-12-08 MED ORDER — PAXLOVID (150/100) 10 X 150 MG & 10 X 100MG PO TBPK
ORAL_TABLET | ORAL | 0 refills | Status: DC
Start: 2022-12-08 — End: 2023-02-16

## 2022-12-08 NOTE — Assessment & Plan Note (Signed)
New, in the setting of recent diarrhea.  Likely UTI. Check UA and culture.   Cipro should cover UTI.

## 2022-12-08 NOTE — Assessment & Plan Note (Addendum)
New. Started prior to covid symptoms.  Labs as ordered. Encouraged hydration. PRN immodium.

## 2022-12-08 NOTE — Assessment & Plan Note (Addendum)
New.   New onset sore throat, difficulty swallowing, associated with cough and runny nose. -COVID swab positive -Prescribe Lidocaine mouthwash for symptomatic relief. -Advise to take Tylenol as needed for pain. -discussed quarantine guidelines. - rx paxlovid at renal dose.

## 2022-12-08 NOTE — Assessment & Plan Note (Signed)
New.     Significant LUQ quadrant pain, history of diverticulosis. Possible diverticulitis given the severity and location of pain. -Order CT scan of abdomen to evaluate for diverticulitis. -Start empiric antibiotics for suspected diverticulitis. -Order stool cultures to rule out infectious colitis.

## 2022-12-08 NOTE — Progress Notes (Signed)
Subjective:     Patient ID: Julie Wilkerson, female    DOB: 01/31/1941, 82 y.o.   MRN: [id number]  Chief Complaint  Patient presents with   Dysuria    Complains of pain with urination   Diarrhea    Complains of occasional diarrhea      Discussed the use of AI scribe software for clinical note transcription with the patient, who gave verbal consent to proceed.  History of Present Illness   The patient, with a history of irritable bowel syndrome (IBS), presents with worsening abdominal pain and diarrhea. She describes the diarrhea as sudden and uncontrollable, leading to "embarrassing" situations.  Diarrhea has been present for about 3 weeks.  She is unsure if the diarrhea is a flare-up of her IBS or related to her current illness.The abdominal pain, which she describes as 'awful,' has been present for three days.  She also reports dysuria which she attributes to a UTI in the setting of diarrhea. The patient was recently on vacation in [place] where she fell ill and sought emergency medical attention. Despite receiving medication, she continues to feel unwell. She now also reports a verysore throat, earache, and cough x 2 days.   The patient has a family history of stomach cancer, with her grandmother and father both succumbing to the disease. 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 She also reports a history of diverticulitis.          Health Maintenance Due  Topic Date Due   Zoster Vaccines- Shingrix (1 of 2) 11/21/1960    Past Medical History:  Diagnosis Date   Abdominal pain 11/07/2016   Accelerating angina (HCC) 02/09/2019   Acute diastolic CHF (congestive heart failure) (HCC) 02/16/2019   Acute on chronic diastolic CHF (congestive heart failure) (HCC) 02/23/2019   Acute pansinusitis 04/26/2017   Amblyopia of eye, left 04/19/2018   Anemia    Anginal pain (HCC)    Anxiety    Arthritis    Asthma    Atypical chest  pain 02/04/2019   Bilateral carotid bruits 03/28/2017   Blood transfusion without reported diagnosis    Breast cancer (HCC)    2002, left, encapsulated, microcalcifications. lumpectomy, radtiation x 30   Breast cancer in female [facility])    Bursitis of left hip 09/04/2018   Change in bowel habits 02/22/2018   Change in mole 06/08/2016   Chest pain 03/04/2020   CHF (congestive heart failure) (HCC)    Chronic bilateral thoracic back pain 05/23/2016   Chronic diastolic congestive heart failure (HCC) 05/28/2019   Colitis    Complication of anesthesia    VERY SENSITIVE TO ANESTHESIA    Constipation 01/17/2018   Coronary artery disease involving native coronary artery of native heart without angina pectoris 05/28/2019   Cortical age-related cataract of right eye 04/18/2018   Cough variant asthma  vs UACS from ACEi     Spirometry 12/02/2016  FEV1 1.26 (76%)  Ratio 78 with min curvature p last saba > 6 h prior  - trial off acei 12/02/2016  - 12/02/2016  After extensive coaching HFA effectiveness =    75% with qvar autohaler > rechallenge with 80 2bid     Decreased visual acuity 01/16/2017   Degeneration of lumbar intervertebral disc 08/01/2018   Diarrhea 11/16/2015   D/o Diverticuli, polyps, celiac disease.     Dyspnea 11/04/2016   with asthma exacerbation only   Dysuria 11/04/2016  Elevated sed rate 10/23/2017   Essential hypertension    Trial off acei 12/02/2016 due to cough/ pseudoasthma   GERD (gastroesophageal reflux disease)    Gluten intolerance    History of chicken pox    History of Helicobacter pylori infection 08/01/2015   History of rectal bleeding 06/01/2019   Hx of LASIK 04/24/2019   Hyperglycemia 11/07/2016   Hyperlipidemia    Hypertension    Hyponatremia 02/16/2019   Hypothyroidism    IBS (irritable bowel syndrome)    Lattice degeneration of right retina 04/24/2019   Left hand pain 05/07/2018   Left hip pain 03/01/2017   Left leg pain 03/01/2017   Left shoulder pain  05/07/2018   Low back pain 08/10/2015   LUQ pain 06/01/2019   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   Neck pain 03/01/2017   Nuclear sclerotic cataract of right eye 04/18/2018   Osteoporosis    Overweight (BMI 25.0-29.9) 02/14/2019   Pain in joint of left shoulder 08/11/2018   Paroxysmal atrial tachycardia 05/28/2019   Pelvic pain 07/02/2018   Personal history of radiation therapy    Pneumonia    PONV (postoperative nausea and vomiting)    Poor appetite 02/22/2018   Pseudophakia of left eye 04/18/2018   PVD (posterior vitreous detachment), right 04/24/2019   Restless sleeper 11/07/2016   Right hip pain 10/23/2017   RLQ discomfort 03/01/2017   RLS (restless legs syndrome) 05/07/2019   S/P left THA, AA 02/13/2019   Sepsis due to pneumonia (HCC) 02/16/2019   Sleep apnea    uses CPAP   Stomach cramps    Tremor 02/15/2016   Urinary frequency 12/22/2017   Urinary incontinence 05/07/2019   Urinary tract infection 11/04/2016   Vitamin D deficiency 04/29/2016    Past Surgical History:  Procedure Laterality Date   ABDOMINAL HYSTERECTOMY     partial   APPENDECTOMY     BREAST LUMPECTOMY Left 2002   CHOLECYSTECTOMY  Age 61 or 41   COLONOSCOPY  2017   EYE SURGERY Left    cataract   LEFT HEART CATH AND CORONARY ANGIOGRAPHY N/A 02/09/2019   Procedure: LEFT HEART CATH AND CORONARY ANGIOGRAPHY;  Surgeon: Yvonne Kendall, MD;  Location: [facility];  Service: Cardiovascular;  Laterality: N/A;   TONSILLECTOMY     TOTAL HIP ARTHROPLASTY Left 02/13/2019   Procedure: TOTAL HIP ARTHROPLASTY ANTERIOR APPROACH;  Surgeon: Durene Romans, MD;  Location: [facility];  Service: Orthopedics;  Laterality: Left;  70 mins   UPPER GASTROINTESTINAL ENDOSCOPY      Family History  Problem Relation Age of Onset   Colitis Mother    Irritable bowel syndrome Mother    Hypertension Father    Hyperlipidemia Sister    Hyperlipidemia Brother    Heart attack  Brother    Hyperlipidemia Brother    Heart attack Brother    Hyperlipidemia Brother    Heart attack Brother    Stomach cancer Paternal Grandmother 72   Colon cancer Paternal Grandmother    Leukemia Daughter    Arthritis Daughter    Heart disease Daughter        ASD vs VSD   Esophageal cancer Neg Hx    Pancreatic cancer Neg Hx    Rectal cancer Neg Hx     Social History   Socioeconomic History   Marital status: Single    Spouse name: Not on file   Number of children: 3   Years of education: 74  Highest education level: Master's degree (e.g., MA, MS, MEng, MEd, MSW, MBA)  Occupational History   Occupation: retired    Comment: Runner, broadcasting/film/video, kindergarten  Tobacco Use   Smoking status: Never   Smokeless tobacco: Never  Vaping Use   Vaping status: Never Used  Substance and Sexual Activity   Alcohol use: Never    Alcohol/week: 0.0 standard drinks of alcohol   Drug use: Never   Sexual activity: Not Currently    Birth control/protection: None    Comment: lives alone, avoids daiiry and gluten. 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  Other Topics Concern   Not on file  Social History Narrative   Not on file   Social Determinants of Health   Financial Resource Strain: Low Risk  (12/19/2020)   Overall Financial Resource Strain (CARDIA)    Difficulty of Paying Living Expenses: Not hard at all  Food Insecurity: No Food Insecurity (04/28/2022)   Hunger Vital Sign    Worried About Running Out of Food in the Last Year: Never true    Ran Out of Food in the Last Year: Never true  Transportation Needs: No Transportation Needs (04/28/2022)   PRAPARE - Administrator, Civil Service (Medical): No    Lack of Transportation (Non-Medical): No  Physical Activity: Insufficiently Active (12/19/2020)   Exercise Vital Sign    Days of Exercise per Week: 1 day    Minutes of Exercise per Session: 30 min  Stress: No Stress Concern Present (12/19/2020)   [employer] of Occupational Health -  Occupational Stress Questionnaire    Feeling of Stress : Not at all  Social Connections: Moderately Integrated (03/29/2022)   Social Connection and Isolation Panel [NHANES]    Frequency of Communication with Friends and Family: More than three times a week    Frequency of Social Gatherings with Friends and Family: More than three times a week    Attends Religious Services: More than 4 times per year    Active Member of [employer] or Organizations: Yes    Attends Banker Meetings: More than 4 times per year    Marital Status: Divorced  Intimate Partner Violence: Unknown (08/28/2021)   Received from [employer], [facility]   HITS    Physically Hurt: Not on file    Insult or Talk Down To: Not on file    Threaten Physical Harm: Not on file    Scream or Curse: Not on file    Outpatient Medications Prior to Visit  Medication Sig Dispense Refill   ASPIRIN 81 PO 81 mg daily.      citalopram (CELEXA) 20 MG tablet Take 1 tablet (20 mg total) by mouth at bedtime. 90 tablet 0   fexofenadine (ALLEGRA ALLERGY) 180 MG tablet Take 1 tablet (180 mg total) by mouth daily as needed for allergies or rhinitis.     FLOVENT HFA 110 MCG/ACT inhaler INHALE 1 TO 2 PUFFS BY MOUTH TWICE DAILY AS NEEDED 12 g 0   furosemide (LASIX) 40 MG tablet 1 tablet every other day 15 tablet 3   hyoscyamine (LEVSIN SL) 0.125 MG SL tablet Use at least twice a day 90 tablet 1   levothyroxine (SYNTHROID) 50 MCG tablet Take 1 tablet (50 mcg total) by mouth daily before breakfast. 90 tablet 1   omega-3 acid ethyl esters (LOVAZA) 1 g capsule Take 2 g by mouth daily.     omeprazole (PRILOSEC) 40 MG capsule Take 1 capsule (40 mg total) by mouth 2 (two) times daily.  60 capsule 5   Peppermint Oil (IBGARD) 90 MG CPCR Use as directed     potassium chloride (KLOR-CON M) 10 MEQ tablet Take 1 tablet (10 mEq total) by mouth daily. 30 tablet 0   sucralfate (CARAFATE) 1 g tablet Take 1 tablet by mouth twice daily 60 tablet 0    Vitamin D, Ergocalciferol, (DRISDOL) 1.25 MG (50000 UNIT) CAPS capsule Take 1 capsule (50,000 Units total) by mouth every 7 (seven) days. 5 capsule 4   doxycycline (VIBRAMYCIN) 100 MG capsule Take 1 capsule (100 mg total) by mouth 2 (two) times daily. 20 capsule 0   predniSONE (DELTASONE) 20 MG tablet Take 40 mg by mouth daily for 3 days then 20 mg daily for 3 days 9 tablet 0   No facility-administered medications prior to visit.    Allergies  Allergen Reactions   Valium [Diazepam] Shortness Of Breath    Sob and bradycardia   Albuterol And Levalbuterol Palpitations    Episode of palpitations, tremor and anxiety when administered levalbuterol for PFT. Recommend avoiding this medicaiton class    Review of Systems  Gastrointestinal:  Positive for diarrhea.  Genitourinary:  Positive for dysuria.       Objective:    Physical Exam Constitutional:      General: She is not in acute distress.    Appearance: Normal appearance. She is well-developed.  HENT:     Head: Normocephalic and atraumatic.     Right Ear: Tympanic membrane, ear canal and external ear normal.     Left Ear: Tympanic membrane, ear canal and external ear normal.     Nose: Congestion present.     Mouth/Throat:     Pharynx: No oropharyngeal exudate or posterior oropharyngeal erythema.  Eyes:     General: No scleral icterus. Neck:     Thyroid: No thyromegaly.  Cardiovascular:     Rate and Rhythm: Normal rate and regular rhythm.     Heart sounds: Normal heart sounds. No murmur heard. Pulmonary:     Effort: Pulmonary effort is normal. No respiratory distress.     Breath sounds: Normal breath sounds. No wheezing.  Abdominal:     General: Bowel sounds are normal.     Palpations: Abdomen is soft.     Tenderness: There is abdominal tenderness in the left upper quadrant. There is no guarding or rebound.  Musculoskeletal:     Cervical back: Neck supple.  Skin:    General: Skin is warm and dry.  Neurological:      Mental Status: She is alert and oriented to person, place, and time.  Psychiatric:        Mood and Affect: Mood normal.        Behavior: Behavior normal.        Thought Content: Thought content normal.        Judgment: Judgment normal.      BP 138/66 (BP Location: Left Arm, Patient Position: Sitting, Cuff Size: Small)   Pulse 66   Temp 97.8 F (36.6 C) (Oral)   Resp 16   Wt 114 lb (51.7 kg)   SpO2 96%   BMI 23.83 kg/m  Wt Readings from Last 3 Encounters:  12/08/22 114 lb (51.7 kg)  11/09/22 112 lb 12.8 oz (51.2 kg)  09/17/22 112 lb (50.8 kg)       Assessment & Plan:     I have discontinued Julie Muse. Wilkerson's doxycycline and predniSONE. I am also having her start on lidocaine, ciprofloxacin, metroNIDAZOLE, and Paxlovid (  150/100). Additionally, I am having her maintain her omega-3 acid ethyl esters, ASPIRIN 81 PO, sucralfate, fexofenadine, levothyroxine, Flovent HFA, hyoscyamine, IBgard, Vitamin D (Ergocalciferol), omeprazole, potassium chloride, furosemide, and citalopram.  Meds ordered this encounter  Medications   lidocaine (XYLOCAINE) 2 % solution    Sig: Use as directed 15 mLs in the mouth or throat every 6 (six) hours as needed for mouth pain.    Dispense:  200 mL    Refill:  0    Order Specific Question:   Supervising Provider    Answer:   Danise Edge A [[id number]]   ciprofloxacin (CIPRO) 500 MG tablet    Sig: Take 1 tablet (500 mg total) by mouth 2 (two) times daily for 10 days.    Dispense:  20 tablet    Refill:  0    Order Specific Question:   Supervising Provider    Answer:   Danise Edge A [[id number]]   metroNIDAZOLE (FLAGYL) 500 MG tablet    Sig: Take 1 tablet (500 mg total) by mouth 2 (two) times daily for 10 days.    Dispense:  20 tablet    Refill:  0    Order Specific Question:   Supervising Provider    Answer:   Danise Edge A [[id number]]   nirmatrelvir & ritonavir (PAXLOVID, 150/100,) 10 x 150 MG & 10 x 100MG  TBPK    Sig: Take per package instructions     Dispense:  30 tablet    Refill:  0    Order Specific Question:   Supervising Provider    Answer:   Danise Edge A [[id number]]

## 2022-12-09 ENCOUNTER — Other Ambulatory Visit: Payer: Medicare HMO

## 2022-12-09 DIAGNOSIS — K529 Noninfective gastroenteritis and colitis, unspecified: Secondary | ICD-10-CM | POA: Diagnosis not present

## 2022-12-09 NOTE — Progress Notes (Signed)
Stool drop off

## 2022-12-10 LAB — CLOSTRIDIUM DIFFICILE BY PCR: Toxigenic C. Difficile by PCR: NEGATIVE

## 2022-12-10 NOTE — Patient Instructions (Signed)
Can you please make sure that she gets her CT scan scheduled unless her abdominal pain has resolved.

## 2022-12-11 ENCOUNTER — Telehealth: Payer: Self-pay | Admitting: Family

## 2022-12-11 LAB — GI PROFILE, STOOL, PCR

## 2022-12-11 LAB — URINE CULTURE
MICRO NUMBER:: 15211801
SPECIMEN QUALITY:: ADEQUATE

## 2022-12-11 NOTE — Telephone Encounter (Signed)
Please as patient how she is feeling and how her abdominal pain is.  If she is still having pain I would like her to schedule the abdominal CT that I ordered.  If pain resolved does not need to complete.

## 2022-12-20 ENCOUNTER — Other Ambulatory Visit: Payer: Self-pay | Admitting: Family Medicine

## 2022-12-28 ENCOUNTER — Other Ambulatory Visit (HOSPITAL_COMMUNITY)
Admission: RE | Admit: 2022-12-28 | Discharge: 2022-12-28 | Disposition: A | Discharge status: Home / Self Care | Payer: Medicare HMO | Source: Other Acute Inpatient Hospital | Attending: Obstetrics and Gynecology | Admitting: Obstetrics and Gynecology

## 2022-12-28 ENCOUNTER — Ambulatory Visit: Payer: Medicare HMO | Admitting: Obstetrics and Gynecology

## 2022-12-28 ENCOUNTER — Encounter: Payer: Self-pay | Admitting: Obstetrics and Gynecology

## 2022-12-28 VITALS — BP 120/74 | HR 59

## 2022-12-28 DIAGNOSIS — R82998 Other abnormal findings in urine: Secondary | ICD-10-CM | POA: Diagnosis not present

## 2022-12-28 DIAGNOSIS — R159 Full incontinence of feces: Secondary | ICD-10-CM | POA: Diagnosis not present

## 2022-12-28 DIAGNOSIS — N390 Urinary tract infection, site not specified: Secondary | ICD-10-CM | POA: Diagnosis not present

## 2022-12-28 DIAGNOSIS — N3281 Overactive bladder: Secondary | ICD-10-CM

## 2022-12-28 DIAGNOSIS — R350 Frequency of micturition: Secondary | ICD-10-CM

## 2022-12-28 LAB — POCT URINALYSIS DIPSTICK
Bilirubin, UA: NEGATIVE
Glucose, UA: NEGATIVE
Ketones, UA: NEGATIVE
Nitrite, UA: NEGATIVE
Protein, UA: NEGATIVE
Spec Grav, UA: 1.02 (ref 1.010–1.025)
Urobilinogen, UA: 0.2 E.U./dL
pH, UA: 5.5 (ref 5.0–8.0)

## 2022-12-28 MED ORDER — ESTRADIOL 0.1 MG/GM VA CREA
TOPICAL_CREAM | VAGINAL | 11 refills | Status: DC
Start: 2022-12-30 — End: 2023-02-16

## 2022-12-28 MED ORDER — SULFAMETHOXAZOLE-TRIMETHOPRIM 800-160 MG PO TABS
1.0000 | ORAL_TABLET | Freq: Two times a day (BID) | ORAL | 0 refills | Status: AC
Start: 2022-12-28 — End: 2023-01-04

## 2022-12-28 NOTE — Progress Notes (Signed)
[facility] Return Visit  SUBJECTIVE  History of Present Illness: Julie Wilkerson is a 82 y.o. female seen in follow-up for OAB and FI and UTI symptoms.   Went to [place] in July and was treated for a UTI there. Was treated for UTI by PCP last month when she returned and she was given ciprofloxacin. Urine culture 7/17 was positive for klebsiella pneumoniae. Not sure the pain from the infection ever really went away. Having suprapubic and feels fatigue.   She is not using vaginal estrogen cream.   Has attempted Myrbetriq, Gemtesa and solifenacin for OAB. Also went through [facility]. She is occasionally taking pyridium which helps symptoms..   She has been having more bowel accidents, usually loose stools. No longer taking fiber supplement.    Past Medical History: Patient  has a past medical history of Abdominal pain (11/07/2016), Accelerating angina (HCC) (02/09/2019), Acute diastolic CHF (congestive heart failure) (HCC) (02/16/2019), Acute on chronic diastolic CHF (congestive heart failure) (HCC) (02/23/2019), Acute pansinusitis (04/26/2017), Amblyopia of eye, left (04/19/2018), Anemia, Anginal pain (HCC), Anxiety, Arthritis, Asthma, Atypical chest pain (02/04/2019), Bilateral carotid bruits (03/28/2017), Blood transfusion without reported diagnosis, Breast cancer (HCC), Breast cancer in female [facility]), Bursitis of left hip (09/04/2018), Change in bowel habits (02/22/2018), Change in mole (06/08/2016), Chest pain (03/04/2020), CHF (congestive heart failure) (HCC), Chronic bilateral thoracic back pain (05/23/2016), Chronic diastolic congestive heart failure (HCC) (05/28/2019), Colitis, Complication of anesthesia, Constipation (01/17/2018), Coronary artery disease involving native coronary artery of native heart without angina pectoris (05/28/2019), Cortical age-related cataract of right eye (04/18/2018), Cough variant asthma  vs UACS from ACEi , Decreased visual acuity (01/16/2017),  Degeneration of lumbar intervertebral disc (08/01/2018), Diarrhea (11/16/2015), Dyspnea (11/04/2016), Dysuria (11/04/2016), Elevated sed rate (10/23/2017), Essential hypertension, GERD (gastroesophageal reflux disease), Gluten intolerance, History of chicken pox, History of Helicobacter pylori infection (08/01/2015), History of rectal bleeding (06/01/2019), LASIK (04/24/2019), Hyperglycemia (11/07/2016), Hyperlipidemia, Hypertension, Hyponatremia (02/16/2019), Hypothyroidism, IBS (irritable bowel syndrome), 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), Left hand pain (05/07/2018), Left hip pain (03/01/2017), Left leg pain (03/01/2017), Left shoulder pain (05/07/2018), Low back pain (08/10/2015), LUQ pain (06/01/2019), 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), 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), Neck pain (03/01/2017), Nuclear sclerotic cataract of right eye (04/18/2018), Osteoporosis, Overweight (BMI 25.0-29.9) (02/14/2019), Pain in joint of left shoulder (08/11/2018), Paroxysmal atrial tachycardia (05/28/2019), Pelvic pain (07/02/2018), Personal history of radiation therapy, Pneumonia, PONV (postoperative nausea and vomiting), Poor appetite (02/22/2018), Pseudophakia of left eye (04/18/2018), PVD (posterior vitreous detachment), right (04/24/2019), Restless sleeper (11/07/2016), Right hip pain (10/23/2017), RLQ discomfort (03/01/2017), RLS (restless legs syndrome) (05/07/2019), S/P left THA, 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   Past Surgical History: She  has a past surgical history that includes Appendectomy; Tonsillectomy; Abdominal hysterectomy; Colonoscopy (2017); Cholecystectomy (Age 78 or 51); Eye surgery (Left); LEFT HEART CATH AND CORONARY ANGIOGRAPHY (N/A, 02/09/2019); Total  hip arthroplasty (Left, 02/13/2019); Breast lumpectomy (Left, 2002); and Upper gastrointestinal endoscopy.   Medications: She has a current medication list which includes the following prescription(s): aspirin, citalopram, [START ON 12/30/2022] estradiol, fexofenadine, flovent hfa, furosemide, hyoscyamine, levothyroxine, lidocaine, paxlovid (150/100), omega-3 acid ethyl esters, omeprazole, ibgard, potassium chloride, sucralfate, sulfamethoxazole-trimethoprim, and vitamin d (ergocalciferol).   Allergies: Patient is allergic to valium [diazepam] and albuterol and levalbuterol.   Social History: Patient  reports that she has never smoked. She has never used smokeless tobacco. She reports that  she does not drink alcohol and does not use drugs.      OBJECTIVE     Physical Exam: Vitals:   12/28/22 1057  BP: 120/74  Pulse: (!) 59   Gen: No apparent distress, A&O x 3.  Detailed Urogynecologic Evaluation:  Deferred.  POC Urine: moderate leukocytes   ASSESSMENT AND PLAN    Julie Wilkerson is a 82 y.o. with:  1. Leukocytes in urine   2. Urinary frequency   3. Recurrent urinary tract infection   4. Acute UTI   5. Incontinence of feces, unspecified fecal incontinence type     UTI - Will treat with bactrim DS BI x 7 days  2. Recurrent UTI - potentially infection not completely treated vs recurrent - Will start with estrace cream for prevention. Then can consider prolonged course of prophylactic antibiotics  3. Fecal incontinence - restart metamucil daily for stool bulking  Return 2-3 weeks for follow up symptoms  Marguerita Beards, MD  - For bowel leakage, start metamucil daily to help with stool bulking to prevent bowel leakage.

## 2022-12-28 NOTE — Patient Instructions (Addendum)
For GI recommendation- Dr Leone Payor at [facility]  For bowel leakage, start metamucil daily to help with stool bulking to prevent bowel leakage.

## 2022-12-29 ENCOUNTER — Encounter: Payer: Self-pay | Admitting: Family Medicine

## 2022-12-30 MED ORDER — NITROFURANTOIN MONOHYD MACRO 100 MG PO CAPS
100.0000 mg | ORAL_CAPSULE | Freq: Two times a day (BID) | ORAL | 0 refills | Status: AC
Start: 2022-12-30 — End: 2023-01-06

## 2022-12-30 NOTE — Addendum Note (Signed)
Addended by: Marguerita Beards on: 12/30/2022 10:43 AM   Modules accepted: Orders

## 2022-12-31 ENCOUNTER — Telehealth (HOSPITAL_BASED_OUTPATIENT_CLINIC_OR_DEPARTMENT_OTHER): Payer: Self-pay

## 2023-01-03 ENCOUNTER — Other Ambulatory Visit: Payer: Self-pay | Admitting: Family Medicine

## 2023-01-03 DIAGNOSIS — R109 Unspecified abdominal pain: Secondary | ICD-10-CM

## 2023-01-03 DIAGNOSIS — R194 Change in bowel habit: Secondary | ICD-10-CM

## 2023-01-03 DIAGNOSIS — K5900 Constipation, unspecified: Secondary | ICD-10-CM

## 2023-01-03 DIAGNOSIS — K529 Noninfective gastroenteritis and colitis, unspecified: Secondary | ICD-10-CM

## 2023-01-03 DIAGNOSIS — K449 Diaphragmatic hernia without obstruction or gangrene: Secondary | ICD-10-CM

## 2023-01-03 DIAGNOSIS — R630 Anorexia: Secondary | ICD-10-CM

## 2023-01-05 ENCOUNTER — Telehealth: Payer: Self-pay | Admitting: Internal Medicine

## 2023-01-05 ENCOUNTER — Ambulatory Visit: Payer: Medicare HMO | Admitting: Obstetrics and Gynecology

## 2023-01-05 NOTE — Telephone Encounter (Signed)
Returned call to pt. Pt is having high BP readings. Her current BP 121/59i s and HR 70. Pt complains of dizziness, HA, sweating, nausea and some lightheadedness at time. Is having pain in the left arm and it gets numb. No chest pain but does have a little pressure in the front of the chest. The symptoms have been going on for one month. Lisinopril was discontinued but when the BP goes she takes a lisinopril and it helps. She would like to know if she should start the lisinopril back up again. Please advise.

## 2023-01-05 NOTE — Telephone Encounter (Signed)
Pt c/o BP issue: STAT if pt c/o blurred vision, one-sided weakness or slurred speech  1. What are your last 5 BP readings?  147/64  151/60  2. Are you having any other symptoms (ex. Dizziness, headache, blurred vision, passed out)? Headache, dizziness and sweating at night.   3. What is your BP issue? Patient states that she hasn't been feeling well the last month and would like a call back to discuss how she is feeling.

## 2023-01-06 NOTE — Telephone Encounter (Signed)
Left voicemail to return call to office.

## 2023-01-07 NOTE — Telephone Encounter (Signed)
Call goes straight to VM.  LM to call office

## 2023-01-07 NOTE — Telephone Encounter (Signed)
Call goes straight to VM. 

## 2023-01-07 NOTE — Telephone Encounter (Signed)
Pt returning nurses phone call. Please advise ?

## 2023-01-10 NOTE — Telephone Encounter (Signed)
Called pt again. Went straight to VM. This is the 5th attempt to call pt.

## 2023-01-18 ENCOUNTER — Ambulatory Visit: Payer: Medicare HMO | Admitting: Primary Care

## 2023-01-18 ENCOUNTER — Telehealth (HOSPITAL_BASED_OUTPATIENT_CLINIC_OR_DEPARTMENT_OTHER): Payer: Self-pay

## 2023-01-18 ENCOUNTER — Encounter: Payer: Self-pay | Admitting: Primary Care

## 2023-01-18 VITALS — BP 132/64 | HR 60 | Temp 98.0°F | Ht <= 58 in | Wt 112.2 lb

## 2023-01-18 DIAGNOSIS — J4520 Mild intermittent asthma, uncomplicated: Secondary | ICD-10-CM | POA: Diagnosis not present

## 2023-01-18 DIAGNOSIS — G4730 Sleep apnea, unspecified: Secondary | ICD-10-CM

## 2023-01-18 NOTE — Assessment & Plan Note (Addendum)
-   Well controlled currently with prn Flovent hfa  - FU in 1 year or sooner if needed

## 2023-01-18 NOTE — Progress Notes (Signed)
@Patient  ID: Julie Wilkerson, female    DOB: 1941-04-14, 82 y.o.   MRN: [id number]  Chief Complaint  Patient presents with   Follow-up    Doing well. No sx noted.    Referring provider: Bradd Canary, MD  HPI: 82 year old female, never smoked.  Past medical history significant for hypertension, chronic congestive heart failure, coronary artery disease, atrial tachycardia, asthma, sleep apnea.  Previous [facility] encounter: 04/16/2022 Patient presents today for sleep consult. She follows with Dr. Francine Graven with [facility] for chronic cough/asthma. She was referred by him during her last office visit d/t history of sleep apnea, she is having issues tolerating CPAP and issues with mask fit. Dr. Tresa Endo with cardiology was managing her sleep apnea. Home sleep study in November 2021 showed moderate obstructive sleep apnea, average AHI 18/hour. CPAP machine not working well, mask reportedly coming off. It took a long time for her to be able to tolerate her CPAP machine. Her daughter states that most masks were too big. She is currently using Airfit [id number] nasal mask size small, which was working well for her until recently. She reports sinus discomfort with use. She is not getting up any purulent mucus. She uses flonase 1-2 times a day. She is using humidification with CPAP.  Dme is [facility].   Airview download 04/16/21-04/15/22 Days used greater than 4 hours - 291/355 (81%) Average usage - 5 hours 58 minutes Leak 95th percentile- 27 L/min Average AHI 3.3 events per hour  Sleep questionnaire Symptoms- CPAP machine not working     Prior sleep study-Home sleep study 2021 Bedtime- 9:30pm Time to fall asleep- 10-15 mins Nocturnal awakenings- 2-3 times  Out of bed/start of day- 8am Weight changes- n/a Do you operate heavy machinery- no Do you currently wear CPAP- yes Do you current wear oxygen- no Epworth- 9  06/15/2022 Patient presents today for follow-up/OSA. Sleep study  November 2021 showed moderate obstructive sleep apnea.  She has been on CPAP for the last 1 to 2 years without issue.  Recently she has been calling with mask fit.  During her last overview we placed an order for her to have mask desensitization study. She received new CPAP mask.  She is still having issue with mask, mainly the back of the headgear is bothering her. She has been unable to wear CPAP. We re-adjusted headgear in office today. She plans to re-try CPAP. She is also interested in alternative treatment options for OSA. She would like to be considered for oral appliance   She has been sleeping well recently. No snoring and waking up gasping for air. She does wake up to use restroom still at night and has some fatigue symptoms.   Airview download 03/15/22-06/12/22 Usage 14/90 days (16%); 9 days (10%) > 4 hours Average usage days used 5 hours 17 mins Pressure 8-14cm h20 Airleaks 11L/min (95%) AHI 2.3  08/17/2022  Patient presents today for 2 month follow-up. Home sleep study in November 2021 showed moderate obstructive sleep apnea, average AHI 18/hour.  Been struggling with CPAP compliance due to mask fit. Her last office visit we adjusted her headgear which seem to help, advised if she was still having difficulty to contact [facility] to set up a time to be refitted for CPAP mask.  She expressed interest in considering oral appliance as an alternative treatment option for sleep apnea.   She had mask fitting last night in sleep lab, she was given xs full face mask. Tolerated  this better.  She owns CPAP machine. No longer getting CPAP supplies, her DME company closed. She did not like new DME company. She saw Dr. Myrtis Ser and oral appliance is being made and will be ready next week. She sleeps well at night. She rarely wakes up. No significant daytime sleepiness. Denies gasping/choking symptoms.   Airview download 06/03/22-07/02/22 Usage 3/30 days; 1 day (3%) > 4 hours Average usage 18  mins Pressure 8-14cm h20 Airleaks 46L/min AHI 11.4   01/18/2023- Interim hx  Patient presents today for OSA follow-up. She is no longer using CPAP, she switched to oral appliance back in March 2024 which is being managed by Dr. Myrtis Ser. She reports wearing oral appliance every night while sleeping. Sleeping well. She wakes up on average 2 times at night to use the bathroom. She has no trouble falling back to sleep. She is not waking up gasping/choking. Sometimes she feels tired but nothing excessive. She will node off for 10 mins occasionally if sitting still  Breathing has been a lot better. She uses flovent as needed on average once a week She had covid in July, treated with paxlovid. She did not need to be hospitalized. No residual respiratory symptoms    Allergies  Allergen Reactions   Valium [Diazepam] Shortness Of Breath    Sob and bradycardia   Albuterol And Levalbuterol Palpitations    Episode of palpitations, tremor and anxiety when administered levalbuterol for PFT. Recommend avoiding this medicaiton class    Immunization History  Administered Date(s) Administered   Fluad Quad(high Dose 65+) 02/01/2019, 05/29/2020, 03/17/2021, 03/25/2022   Influenza, High Dose Seasonal PF 03/11/2015, 02/05/2016, 02/05/2017, 03/23/2018   Influenza,inj,quad, With Preservative 05/23/2014, 03/24/2018   PFIZER(Purple Top)SARS-COV-2 Vaccination 06/13/2019, 07/04/2019, 04/22/2020   Pneumococcal Conjugate-13 02/01/2019   Pneumococcal Polysaccharide-23 05/23/2014   Td 02/05/2016   Zoster, Live 05/24/2009    Past Medical History:  Diagnosis Date   Abdominal pain 11/07/2016   Accelerating angina (HCC) 02/09/2019   Acute diastolic CHF (congestive heart failure) (HCC) 02/16/2019   Acute on chronic diastolic CHF (congestive heart failure) (HCC) 02/23/2019   Acute pansinusitis 04/26/2017   Amblyopia of eye, left 04/19/2018   Anemia    Anginal pain (HCC)    Anxiety    Arthritis    Asthma     Atypical chest pain 02/04/2019   Bilateral carotid bruits 03/28/2017   Blood transfusion without reported diagnosis    Breast cancer (HCC)    2002, left, encapsulated, microcalcifications. lumpectomy, radtiation x 30   Breast cancer in female [facility])    Bursitis of left hip 09/04/2018   Change in bowel habits 02/22/2018   Change in mole 06/08/2016   Chest pain 03/04/2020   CHF (congestive heart failure) (HCC)    Chronic bilateral thoracic back pain 05/23/2016   Chronic diastolic congestive heart failure (HCC) 05/28/2019   Colitis    Complication of anesthesia    VERY SENSITIVE TO ANESTHESIA    Constipation 01/17/2018   Coronary artery disease involving native coronary artery of native heart without angina pectoris 05/28/2019   Cortical age-related cataract of right eye 04/18/2018   Cough variant asthma  vs UACS from ACEi     Spirometry 12/02/2016  FEV1 1.26 (76%)  Ratio 78 with min curvature p last saba > 6 h prior  - trial off acei 12/02/2016  - 12/02/2016  After extensive coaching HFA effectiveness =    75% with qvar autohaler > rechallenge with 80 2bid     Decreased visual  acuity 01/16/2017   Degeneration of lumbar intervertebral disc 08/01/2018   Diarrhea 11/16/2015   D/o Diverticuli, polyps, celiac disease.     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   Left hand pain 05/07/2018   Left hip pain 03/01/2017   Left leg pain 03/01/2017   Left  shoulder pain 05/07/2018   Low back pain 08/10/2015   LUQ pain 06/01/2019   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   Neck pain 03/01/2017   Nuclear sclerotic cataract of right eye 04/18/2018   Osteoporosis    Overweight (BMI 25.0-29.9) 02/14/2019   Pain in joint of left shoulder 08/11/2018   Paroxysmal atrial tachycardia 05/28/2019   Pelvic pain 07/02/2018   Personal history of radiation therapy    Pneumonia    PONV (postoperative nausea and vomiting)    Poor appetite 02/22/2018   Pseudophakia of left eye 04/18/2018   PVD (posterior vitreous detachment), right 04/24/2019   Restless sleeper 11/07/2016   Right hip pain 10/23/2017   RLQ discomfort 03/01/2017   RLS (restless legs syndrome) 05/07/2019   S/P left THA, AA 02/13/2019   Sepsis due to pneumonia (HCC) 02/16/2019   Sleep apnea    uses CPAP   Stomach cramps    Tremor 02/15/2016   Urinary frequency 12/22/2017   Urinary incontinence 05/07/2019   Urinary tract infection 11/04/2016   Vitamin D deficiency 04/29/2016    Tobacco History: Social History   Tobacco Use  Smoking Status Never  Smokeless Tobacco Never   Counseling given: Not Answered   Outpatient Medications Prior to Visit  Medication Sig Dispense Refill   ASPIRIN 81 PO 81 mg daily.      citalopram (CELEXA) 20 MG tablet Take 1 tablet (20 mg total) by mouth at bedtime. 90 tablet 0   estradiol (ESTRACE) 0.1 MG/GM vaginal cream Place 0.5g nightly for two weeks then twice a week after 30 g 11   fexofenadine (ALLEGRA ALLERGY) 180 MG tablet Take 1 tablet (180 mg total) by mouth daily as needed for allergies or rhinitis.     FLOVENT HFA 110 MCG/ACT inhaler INHALE 1 TO 2 PUFFS BY MOUTH TWICE DAILY AS NEEDED 12 g 0   furosemide (LASIX) 40 MG tablet 1 tablet every other day 15 tablet 3   hyoscyamine (LEVSIN SL) 0.125 MG SL tablet Use at least twice a day 90 tablet 1   levothyroxine (SYNTHROID) 50 MCG tablet Take 1 tablet (50  mcg total) by mouth daily before breakfast. 90 tablet 1   nirmatrelvir & ritonavir (PAXLOVID, 150/100,) 10 x 150 MG & 10 x 100MG  TBPK Take per package instructions 30 tablet 0   omega-3 acid ethyl esters (LOVAZA) 1 g capsule Take 2 g by mouth daily.     omeprazole (PRILOSEC) 40 MG capsule Take 1 capsule (40 mg total) by mouth 2 (two) times daily. 60 capsule 5   Peppermint  Oil (IBGARD) 90 MG CPCR Use as directed     potassium chloride (KLOR-CON M) 10 MEQ tablet Take 1 tablet (10 mEq total) by mouth daily. 30 tablet 0   sucralfate (CARAFATE) 1 g tablet Take 1 tablet by mouth twice daily 60 tablet 0   Vitamin D, Ergocalciferol, (DRISDOL) 1.25 MG (50000 UNIT) CAPS capsule Take 1 capsule (50,000 Units total) by mouth every 7 (seven) days. 5 capsule 4   lidocaine (XYLOCAINE) 2 % solution Use as directed 15 mLs in the mouth or throat every 6 (six) hours as needed for mouth pain. (Patient not taking: Reported on 01/18/2023) 200 mL 0   No facility-administered medications prior to visit.   Review of Systems  Review of Systems  Constitutional: Negative.  Negative for fatigue.  Respiratory: Negative.  Negative for shortness of breath.   Cardiovascular: Negative.    Physical Exam  BP 132/64 (BP Location: Left Arm, Patient Position: Sitting, Cuff Size: Normal)   Pulse 60   Temp 98 F (36.7 C) (Oral)   Ht 4\' 10"  (1.473 m)   Wt 112 lb 3.2 oz (50.9 kg)   SpO2 95%   BMI 23.45 kg/m  Physical Exam Constitutional:      Appearance: Normal appearance.  HENT:     Head: Normocephalic and atraumatic.     Mouth/Throat:     Mouth: Mucous membranes are moist.     Pharynx: Oropharynx is clear.  Cardiovascular:     Rate and Rhythm: Normal rate and regular rhythm.  Pulmonary:     Effort: Pulmonary effort is normal.     Breath sounds: Normal breath sounds.  Skin:    General: Skin is warm and dry.  Neurological:     General: No focal deficit present.     Mental Status: She is alert and oriented to  person, place, and time. Mental status is at baseline.  Psychiatric:        Mood and Affect: Mood normal.        Behavior: Behavior normal.        Thought Content: Thought content normal.        Judgment: Judgment normal.      Lab Results:  CBC    Component Value Date/Time   WBC 7.0 12/08/2022 1047   RBC 3.74 (L) 12/08/2022 1047   HGB 10.9 (L) 12/08/2022 1047   HGB 11.0 (L) 02/05/2019 1058   HCT 32.8 (L) 12/08/2022 1047   HCT 33.7 (L) 02/05/2019 1058   PLT 229.0 12/08/2022 1047   PLT 227 02/05/2019 1058   MCV 87.7 12/08/2022 1047   MCV 89 02/05/2019 1058   MCH 28.6 07/21/2022 1622   MCHC 33.1 12/08/2022 1047   RDW 15.0 12/08/2022 1047   RDW 14.0 02/05/2019 1058   LYMPHSABS 0.8 12/08/2022 1047   MONOABS 0.8 12/08/2022 1047   EOSABS 0.0 12/08/2022 1047   BASOSABS 0.1 12/08/2022 1047    BMET    Component Value Date/Time   NA 136 12/08/2022 1047   NA 136 (A) 10/04/2019 0000   K 4.2 12/08/2022 1047   CL 103 12/08/2022 1047   CO2 25 12/08/2022 1047   GLUCOSE 107 (H) 12/08/2022 1047   BUN 28 (H) 12/08/2022 1047   BUN 28 (A) 10/04/2019 0000   CREATININE 1.19 12/08/2022 1047   CREATININE 1.39 (H) 09/04/2021 1558   CALCIUM 9.0 12/08/2022 1047   GFRNONAA 45 (L) 07/21/2022 1622   GFRAA 57 (L) 04/03/2019 1111    BNP  Component Value Date/Time   BNP 190.5 (H) 12/23/2020 1118    ProBNP    Component Value Date/Time   PROBNP 92.0 11/09/2022 1553    Imaging: No results found.   Assessment & Plan:   Sleep apnea - Patient has moderate sleep apnea. Switched from CPAP to oral device in March 2024, being managed by Dr. Myrtis Ser. She reports compliance with oral appliance nightly. No snoring symptoms, gasping/choking or excessive daytime sleepiness. Patient to follow-up with Dr. Myrtis Ser.  Mild intermittent asthma - Well controlled currently with prn Flovent hfa  - FU in 1 year or sooner if needed   Glenford Bayley, NP 01/18/2023

## 2023-01-18 NOTE — Patient Instructions (Addendum)
- Continue to wear oral appliance nightly while sleeping - Ask Dr. Myrtis Ser if you need repeat sleep testing while wearing oral device - Recommend you get RSV vaccine through your pharmacy  - If sleep symptoms worsen return to office sooner/Hold onto your CPAP as back up if needed  Follow-up 1 year with Dr. Francine Graven or Waynetta Sandy NP (asthma/sleep apnea on oral appliance)   Sleep Apnea Sleep apnea affects breathing during sleep. It causes breathing to stop for 10 seconds or more, or to become shallow. People with sleep apnea usually snore loudly. It can also increase the risk of: Heart attack. Stroke. Being very overweight (obese). Diabetes. Heart failure. Irregular heartbeat. High blood pressure. The goal of treatment is to help you breathe normally again. What are the causes?  The most common cause of this condition is a collapsed or blocked airway. There are three kinds of sleep apnea: Obstructive sleep apnea. This is caused by a blocked or collapsed airway. Central sleep apnea. This happens when the brain does not send the right signals to the muscles that control breathing. Mixed sleep apnea. This is a combination of obstructive and central sleep apnea. What increases the risk? Being overweight. Smoking. Having a small airway. Being older. Being female. Drinking alcohol. Taking medicines to calm yourself (sedatives or tranquilizers). Having family members with the condition. Having a tongue or tonsils that are larger than normal. What are the signs or symptoms? Trouble staying asleep. Loud snoring. Headaches in the morning. Waking up gasping. Dry mouth or sore throat in the morning. Being sleepy or tired during the day. If you are sleepy or tired during the day, you may also: Not be able to focus your mind (concentrate). Forget things. Get angry a lot and have mood swings. Feel sad (depressed). Have changes in your personality. Have less interest in sex, if you are female. Be  unable to have an erection, if you are female. How is this treated?  Sleeping on your side. Using a medicine to get rid of mucus in your nose (decongestant). Avoiding the use of alcohol, medicines to help you relax, or certain pain medicines (narcotics). Losing weight, if needed. Changing your diet. Quitting smoking. Using a machine to open your airway while you sleep, such as: An oral appliance. This is a mouthpiece that shifts your lower jaw forward. A CPAP device. This device blows air through a mask when you breathe out (exhale). An EPAP device. This has valves that you put in each nostril. A BIPAP device. This device blows air through a mask when you breathe in (inhale) and breathe out. Having surgery if other treatments do not work. Follow these instructions at home: Lifestyle Make changes that your doctor recommends. Eat a healthy diet. Lose weight if needed. Avoid alcohol, medicines to help you relax, and some pain medicines. Do not smoke or use any products that contain nicotine or tobacco. If you need help quitting, ask your doctor. General instructions Take over-the-counter and prescription medicines only as told by your doctor. If you were given a machine to use while you sleep, use it only as told by your doctor. If you are having surgery, make sure to tell your doctor you have sleep apnea. You may need to bring your device with you. Keep all follow-up visits. Contact a doctor if: The machine that you were given to use during sleep bothers you or does not seem to be working. You do not get better. You get worse. Get help right away  if: Your chest hurts. You have trouble breathing in enough air. You have an uncomfortable feeling in your back, arms, or stomach. You have trouble talking. One side of your body feels weak. A part of your face is hanging down. These symptoms may be an emergency. Get help right away. Call your local emergency services (911 in the [place] Do  not wait to see if the symptoms will go away. Do not drive yourself to the hospital. Summary This condition affects breathing during sleep. The most common cause is a collapsed or blocked airway. The goal of treatment is to help you breathe normally while you sleep. This information is not intended to replace advice given to you by your health care provider. Make sure you discuss any questions you have with your health care provider. Document Revised: 12/17/2020 Document Reviewed: 04/18/2020 Elsevier Patient Education  2024 [employer].

## 2023-01-18 NOTE — Assessment & Plan Note (Addendum)
-   Patient has moderate sleep apnea. Switched from CPAP to oral device in March 2024, being managed by Dr. Myrtis Ser. She reports compliance with oral appliance nightly. No snoring symptoms, gasping/choking or excessive daytime sleepiness. Patient to follow-up with Dr. Myrtis Ser.

## 2023-01-21 ENCOUNTER — Ambulatory Visit: Payer: Medicare HMO | Admitting: Obstetrics and Gynecology

## 2023-01-21 ENCOUNTER — Encounter: Payer: Self-pay | Admitting: Obstetrics and Gynecology

## 2023-01-21 VITALS — BP 105/61 | HR 75

## 2023-01-21 DIAGNOSIS — R152 Fecal urgency: Secondary | ICD-10-CM | POA: Diagnosis not present

## 2023-01-21 DIAGNOSIS — M62838 Other muscle spasm: Secondary | ICD-10-CM | POA: Diagnosis not present

## 2023-01-21 DIAGNOSIS — M6289 Other specified disorders of muscle: Secondary | ICD-10-CM | POA: Diagnosis not present

## 2023-01-21 DIAGNOSIS — N813 Complete uterovaginal prolapse: Secondary | ICD-10-CM | POA: Diagnosis not present

## 2023-01-21 NOTE — Patient Instructions (Signed)
Start pelvic floor physical therapy. This will help the muscle pain and the muscle spasms in the pelvic floor. Also make sure you are using the estrogen cream twice a week and you are doing the fibe to help bulk up the stool.

## 2023-01-21 NOTE — Progress Notes (Signed)
[facility] Return Visit  SUBJECTIVE  History of Present Illness: Julie Wilkerson is a 82 y.o. female seen in follow-up for Pelvic floor pain and possible bulge. Plan at last visit was start vaginal estrogen and restart metamucil.     Past Medical History: Patient  has a past medical history of Abdominal pain (11/07/2016), Accelerating angina (HCC) (02/09/2019), Acute diastolic CHF (congestive heart failure) (HCC) (02/16/2019), Acute on chronic diastolic CHF (congestive heart failure) (HCC) (02/23/2019), Acute pansinusitis (04/26/2017), Amblyopia of eye, left (04/19/2018), Anemia, Anginal pain (HCC), Anxiety, Arthritis, Asthma, Atypical chest pain (02/04/2019), Bilateral carotid bruits (03/28/2017), Blood transfusion without reported diagnosis, Breast cancer (HCC), Breast cancer in female [facility]), Bursitis of left hip (09/04/2018), Change in bowel habits (02/22/2018), Change in mole (06/08/2016), Chest pain (03/04/2020), CHF (congestive heart failure) (HCC), Chronic bilateral thoracic back pain (05/23/2016), Chronic diastolic congestive heart failure (HCC) (05/28/2019), Colitis, Complication of anesthesia, Constipation (01/17/2018), Coronary artery disease involving native coronary artery of native heart without angina pectoris (05/28/2019), Cortical age-related cataract of right eye (04/18/2018), Cough variant asthma  vs UACS from ACEi , Decreased visual acuity (01/16/2017), Degeneration of lumbar intervertebral disc (08/01/2018), Diarrhea (11/16/2015), Dyspnea (11/04/2016), Dysuria (11/04/2016), Elevated sed rate (10/23/2017), Essential hypertension, GERD (gastroesophageal reflux disease), Gluten intolerance, History of chicken pox, History of Helicobacter pylori infection (08/01/2015), History of rectal bleeding (06/01/2019), LASIK (04/24/2019), Hyperglycemia (11/07/2016), Hyperlipidemia, Hypertension, Hyponatremia (02/16/2019), Hypothyroidism, IBS (irritable bowel syndrome), 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), Left hand pain (05/07/2018), Left hip pain (03/01/2017), Left leg pain (03/01/2017), Left shoulder pain (05/07/2018), Low back pain (08/10/2015), LUQ pain (06/01/2019), 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), 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), Neck pain (03/01/2017), Nuclear sclerotic cataract of right eye (04/18/2018), Osteoporosis, Overweight (BMI 25.0-29.9) (02/14/2019), Pain in joint of left shoulder (08/11/2018), Paroxysmal atrial tachycardia (05/28/2019), Pelvic pain (07/02/2018), Personal history of radiation therapy, Pneumonia, PONV (postoperative nausea and vomiting), Poor appetite (02/22/2018), Pseudophakia of left eye (04/18/2018), PVD (posterior vitreous detachment), right (04/24/2019), Restless sleeper (11/07/2016), Right hip pain (10/23/2017), RLQ discomfort (03/01/2017), RLS (restless legs syndrome) (05/07/2019), S/P left THA, 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   Past Surgical History: She  has a past surgical history that includes Appendectomy; Tonsillectomy; Abdominal hysterectomy; Colonoscopy (2017); Cholecystectomy (Age 70 or 22); Eye surgery (Left); LEFT HEART CATH AND CORONARY ANGIOGRAPHY (N/A, 02/09/2019); Total hip arthroplasty (Left, 02/13/2019); Breast lumpectomy (Left, 2002); and Upper gastrointestinal endoscopy.   Medications: She has a current medication list which includes the following prescription(s): aspirin, citalopram, estradiol, fexofenadine, flovent hfa, furosemide, hyoscyamine, levothyroxine, paxlovid (150/100), omega-3 acid ethyl esters, omeprazole, ibgard, potassium chloride, sucralfate, and vitamin d (ergocalciferol).   Allergies: Patient is allergic to valium [diazepam] and albuterol and  levalbuterol.   Social History: Patient  reports that she has never smoked. She has never used smokeless tobacco. She reports that she does not drink alcohol and does not use drugs.      OBJECTIVE     Physical Exam: Vitals:   01/21/23 1128  BP: 105/61  Pulse: 75   Gen: No apparent distress, A&O x 3.  Detailed Urogynecologic Evaluation:  Significant vaginal atrophy on exam. No sign of mass, bleeding, or other pelvic abnormality. Patient has obvious levator tenderness and obturator muscle tenderness.   POP-Q  -1  Aa   -1                                           Ba  -5.5                                              C   2.5                                            Gh  2.5                                            Pb  7                                            tvl   -2                                            Ap  -2                                            Bp                                                 D        ASSESSMENT AND PLAN    Julie Wilkerson is a 82 y.o. with:  1. Fecal urgency   2. Pelvic floor dysfunction in female   3. Levator spasm   4. 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 She would benefit from pelvic floor PT. Previously we have not pushed the pelvic floor PT but she is not a good candidate for SNM and she is highly sensitive to medication. Would not start her on a muscle relaxant because of this.  Will send patient for pelvic floor PT. She has significant pelvic floor muscle tension and pain with exam.  She will start pelvic floor PT.  Would not suggest a pessary for patient as she has such tenderness and vaginal atrophy. Will see how she feels after some pelvic floor PT.  Will plan to see patient back before the end of the year to follow up on her progress.

## 2023-02-03 ENCOUNTER — Other Ambulatory Visit: Payer: Self-pay | Admitting: Medical

## 2023-02-04 ENCOUNTER — Telehealth (HOSPITAL_BASED_OUTPATIENT_CLINIC_OR_DEPARTMENT_OTHER): Payer: Self-pay

## 2023-02-04 ENCOUNTER — Other Ambulatory Visit: Payer: Self-pay | Admitting: Family Medicine

## 2023-02-09 ENCOUNTER — Ambulatory Visit (HOSPITAL_BASED_OUTPATIENT_CLINIC_OR_DEPARTMENT_OTHER)
Admission: RE | Admit: 2023-02-09 | Discharge: 2023-02-09 | Disposition: A | Discharge status: Home / Self Care | Payer: Medicare HMO | Source: Ambulatory Visit | Attending: Family | Admitting: Family

## 2023-02-09 DIAGNOSIS — R1012 Left upper quadrant pain: Secondary | ICD-10-CM | POA: Insufficient documentation

## 2023-02-09 DIAGNOSIS — K5730 Diverticulosis of large intestine without perforation or abscess without bleeding: Secondary | ICD-10-CM | POA: Diagnosis not present

## 2023-02-09 DIAGNOSIS — K5792 Diverticulitis of intestine, part unspecified, without perforation or abscess without bleeding: Secondary | ICD-10-CM | POA: Diagnosis not present

## 2023-02-09 DIAGNOSIS — K449 Diaphragmatic hernia without obstruction or gangrene: Secondary | ICD-10-CM | POA: Diagnosis not present

## 2023-02-09 MED ORDER — IOHEXOL 300 MG/ML  SOLN
100.0000 mL | Freq: Once | INTRAMUSCULAR | Status: AC | PRN
  Administered 2023-02-09: 100 mL via INTRAVENOUS

## 2023-02-14 ENCOUNTER — Telehealth: Payer: Self-pay | Admitting: Family

## 2023-02-14 DIAGNOSIS — R109 Unspecified abdominal pain: Secondary | ICD-10-CM

## 2023-02-14 DIAGNOSIS — R197 Diarrhea, unspecified: Secondary | ICD-10-CM

## 2023-02-14 NOTE — Telephone Encounter (Signed)
Please contact pt and let her know that I reviewed her CT abdomen results and it shows some inflammation of the colon.  Does she have any left sided abdominal pain right now? How about diarrhea?

## 2023-02-14 NOTE — Telephone Encounter (Signed)
Patient reports she is still having pain on both sides of abdomen and diarrhea for the past 3 days.   She was notified of results.

## 2023-02-15 ENCOUNTER — Encounter: Payer: Self-pay | Admitting: Family Medicine

## 2023-02-15 MED ORDER — AMOXICILLIN-POT CLAVULANATE 875-125 MG PO TABS: 1.0000 | ORAL_TABLET | Freq: Two times a day (BID) | ORAL | 0 refills | Status: DC

## 2023-02-15 NOTE — Addendum Note (Signed)
Addended by: Sandford Craze on: 02/15/2023 08:12 AM   Modules accepted: Orders

## 2023-02-15 NOTE — Telephone Encounter (Signed)
Patient notified of information, new rx and referral to GI. She reports she is scheduled to see a GI Dr. Serita Wilkerson on 03/25/23.

## 2023-02-15 NOTE — Telephone Encounter (Signed)
I would recommend that she start augmentin for possible diverticulitis infection. She may use imodium as needed.  I would also like for her to see GI for consultation.  If symptoms worsen or if they do not improve please schedule an office visit with our office.

## 2023-02-16 ENCOUNTER — Ambulatory Visit: Payer: Medicare HMO | Admitting: Family Medicine

## 2023-02-16 ENCOUNTER — Encounter: Payer: Self-pay | Admitting: Family Medicine

## 2023-02-16 VITALS — BP 131/65 | HR 62 | Temp 98.0°F | Ht <= 58 in | Wt 111.0 lb

## 2023-02-16 DIAGNOSIS — R1030 Lower abdominal pain, unspecified: Secondary | ICD-10-CM

## 2023-02-16 DIAGNOSIS — N3001 Acute cystitis with hematuria: Secondary | ICD-10-CM | POA: Diagnosis not present

## 2023-02-16 DIAGNOSIS — D649 Anemia, unspecified: Secondary | ICD-10-CM | POA: Diagnosis not present

## 2023-02-16 DIAGNOSIS — R300 Dysuria: Secondary | ICD-10-CM

## 2023-02-16 DIAGNOSIS — R3129 Other microscopic hematuria: Secondary | ICD-10-CM

## 2023-02-16 LAB — POC URINALSYSI DIPSTICK (AUTOMATED)
Glucose, UA: POSITIVE — AB
Ketones, UA: 40
Nitrite, UA: POSITIVE
Protein, UA: POSITIVE — AB
Spec Grav, UA: 1.01 (ref 1.010–1.025)
Urobilinogen, UA: 2 E.U./dL — AB
pH, UA: 6 (ref 5.0–8.0)

## 2023-02-16 MED ORDER — AMOXICILLIN-POT CLAVULANATE 875-125 MG PO TABS
1.0000 | ORAL_TABLET | Freq: Two times a day (BID) | ORAL | 0 refills | Status: DC
Start: 2023-02-16 — End: 2023-02-25

## 2023-02-16 NOTE — Progress Notes (Signed)
Acute Office Visit  Subjective:     Patient ID: Julie Wilkerson, female    DOB: 1941-01-06, 82 y.o.   MRN: [id number]  Chief Complaint  Patient presents with   Dysuria     Patient is in today for dysuria.   Discussed the use of AI scribe software for clinical note transcription with the patient, who gave verbal consent to proceed.  History of Present Illness   The patient, with a history of diverticulosis and urinary incontinence, presents with worsening urinary symptoms and abdominal pain following a recent CT scan with oral contrast. They report severe stomach pain and diarrhea after drinking the barium contrast, which subsequently exacerbated their urinary symptoms. They describe the urinary symptoms as a burning, stabbing pain during urination, increased urinary frequency, and a sensation of 'something cutting' them. The pain is localized to the lower abdomen and lower back, with the left side being more painful than the right.  In addition to these symptoms, the patient also reports ongoing issues with urinary incontinence, for which they are scheduled to start pelvic floor physical therapy. The patient's daughter, a PA, recommended Azo, which the patient has been taking with minimal relief.   The patient's recent CT scan was performed due to concerns of diverticulitis. The scan revealed diverticulosis in the left colon with inflammation, suggesting acute diverticulitis. The patient was prescribed Augmentin for this condition, but there was confusion about the prescription pick-up, and the patient has not yet started the medication.           All review of systems negative except what is listed in the HPI      Objective:    BP 131/65   Pulse 62   Temp 98 F (36.7 C) (Oral)   Ht 4\' 10"  (1.473 m)   Wt 111 lb (50.3 kg)   SpO2 95%   BMI 23.20 kg/m    Physical Exam Vitals reviewed.  Constitutional:      Appearance: Normal appearance.  Cardiovascular:     Rate and  Rhythm: Normal rate and regular rhythm.     Heart sounds: Normal heart sounds.  Pulmonary:     Effort: Pulmonary effort is normal.     Breath sounds: Normal breath sounds.  Abdominal:     Tenderness: There is abdominal tenderness in the suprapubic area, left upper quadrant and left lower quadrant. There is no right CVA tenderness or left CVA tenderness.  Skin:    General: Skin is warm and dry.  Neurological:     Mental Status: She is alert and oriented to person, place, and time.  Psychiatric:        Mood and Affect: Mood normal.        Behavior: Behavior normal.        Thought Content: Thought content normal.        Judgment: Judgment normal.        Results for orders placed or performed in visit on 02/16/23  POCT Urinalysis Dipstick (Automated)  Result Value Ref Range   Color, UA orange    Clarity, UA clear    Glucose, UA Positive (A) Negative   Bilirubin, UA large    Ketones, UA 40 mg/dl    Spec Grav, UA 0.865 1.010 - 1.025   Blood, UA small    pH, UA 6.0 5.0 - 8.0   Protein, UA Positive (A) Negative   Urobilinogen, UA 2.0 (A) 0.2 or 1.0 E.U./dL   Nitrite, UA positive  Leukocytes, UA Large (3+) (A) Negative        Assessment & Plan:   Problem List Items Addressed This Visit       Active Problems   Abdominal pain Likely Acute Diverticulitis Recent CT scan suggestive of acute diverticulitis. Patient reports abdominal pain and diarrhea after contrast ingestion for CT scan. -Start Augmentin as prescribed by previous provider. -Advise patient to follow up with GI specialist as scheduled.    Relevant Medications   amoxicillin-clavulanate (AUGMENTIN) 875-125 MG tablet   Dysuria - Primary Dysuria, frequency, and lower abdominal pain. Urinalysis suggestive of UTI. Patient currently taking Azo for symptomatic relief. -Send urine for culture and sensitivity. -Start Augmentin as prescribed by previous provider. Will make changes pending culture results. -Recheck  urine 2 weeks after completion of antibiotics.   Relevant Medications   amoxicillin-clavulanate (AUGMENTIN) 875-125 MG tablet   Other Relevant Orders   POCT Urinalysis Dipstick (Automated) (Completed)   Urine Culture   Comprehensive metabolic panel   CBC with Differential/Platelet   Urine Microscopic Only         Meds ordered this encounter  Medications   amoxicillin-clavulanate (AUGMENTIN) 875-125 MG tablet    Sig: Take 1 tablet by mouth 2 (two) times daily.    Dispense:  20 tablet    Refill:  0    Order Specific Question:   Supervising Provider    Answer:   Danise Edge A [[id number]]    Return if symptoms worsen or fail to improve.  Clayborne Dana, NP

## 2023-02-17 ENCOUNTER — Ambulatory Visit (INDEPENDENT_AMBULATORY_CARE_PROVIDER_SITE_OTHER): Payer: Medicare HMO

## 2023-02-17 DIAGNOSIS — D649 Anemia, unspecified: Secondary | ICD-10-CM

## 2023-02-17 LAB — CBC WITH DIFFERENTIAL/PLATELET
Basophils Absolute: 0.1 10*3/uL (ref 0.0–0.1)
Basophils Relative: 0.9 % (ref 0.0–3.0)
Eosinophils Absolute: 0 10*3/uL (ref 0.0–0.7)
Eosinophils Relative: 0.1 % (ref 0.0–5.0)
HCT: 32.7 % — ABNORMAL LOW (ref 36.0–46.0)
Hemoglobin: 10.8 g/dL — ABNORMAL LOW (ref 12.0–15.0)
Lymphocytes Relative: 30.1 % (ref 12.0–46.0)
Lymphs Abs: 2 10*3/uL (ref 0.7–4.0)
MCHC: 32.9 g/dL (ref 30.0–36.0)
MCV: 88.1 fl (ref 78.0–100.0)
Monocytes Absolute: 0.7 10*3/uL (ref 0.1–1.0)
Monocytes Relative: 11.2 % (ref 3.0–12.0)
Neutro Abs: 3.8 10*3/uL (ref 1.4–7.7)
Neutrophils Relative %: 57.7 % (ref 43.0–77.0)
Platelets: 235 10*3/uL (ref 150.0–400.0)
RBC: 3.72 Mil/uL — ABNORMAL LOW (ref 3.87–5.11)
RDW: 15.8 % — ABNORMAL HIGH (ref 11.5–15.5)
WBC: 6.6 10*3/uL (ref 4.0–10.5)

## 2023-02-17 LAB — COMPREHENSIVE METABOLIC PANEL
ALT: 11 U/L (ref 0–35)
AST: 18 U/L (ref 0–37)
Albumin: 3.9 g/dL (ref 3.5–5.2)
Alkaline Phosphatase: 90 U/L (ref 39–117)
BUN: 30 mg/dL — ABNORMAL HIGH (ref 6–23)
CO2: 26 mEq/L (ref 19–32)
Calcium: 8.9 mg/dL (ref 8.4–10.5)
Chloride: 106 mEq/L (ref 96–112)
Creatinine, Ser: 1.15 mg/dL (ref 0.40–1.20)
GFR: 44.76 mL/min — ABNORMAL LOW (ref >60.00)
Glucose, Bld: 98 mg/dL (ref 70–99)
Potassium: 4 mEq/L (ref 3.5–5.1)
Sodium: 139 mEq/L (ref 135–145)
Total Bilirubin: 0.4 mg/dL (ref 0.2–1.2)
Total Protein: 6.7 g/dL (ref 6.0–8.3)

## 2023-02-17 LAB — URINALYSIS, MICROSCOPIC ONLY

## 2023-02-17 NOTE — Addendum Note (Signed)
Addended by: Hyman Hopes B on: 02/17/2023 12:23 PM   Modules accepted: Orders

## 2023-02-18 LAB — URINE CULTURE
MICRO NUMBER:: 15514724
SPECIMEN QUALITY:: ADEQUATE

## 2023-02-18 LAB — IBC + FERRITIN
Ferritin: 18.5 ng/mL (ref 10.0–291.0)
Iron: 60 ug/dL (ref 42–145)
Saturation Ratios: 15.7 % — ABNORMAL LOW (ref 20.0–50.0)
TIBC: 382.2 ug/dL (ref 250.0–450.0)
Transferrin: 273 mg/dL (ref 212.0–360.0)

## 2023-02-19 ENCOUNTER — Other Ambulatory Visit: Payer: Self-pay | Admitting: Family Medicine

## 2023-02-21 MED ORDER — FOSFOMYCIN TROMETHAMINE 3 G PO PACK: 3.0000 g | PACK | Freq: Once | ORAL | 0 refills | Status: AC

## 2023-02-21 NOTE — Addendum Note (Signed)
Addended by: Hyman Hopes B on: 02/21/2023 10:06 AM   Modules accepted: Orders

## 2023-02-24 ENCOUNTER — Ambulatory Visit: Payer: Medicare HMO | Admitting: Physical Therapy

## 2023-02-25 ENCOUNTER — Ambulatory Visit (INDEPENDENT_AMBULATORY_CARE_PROVIDER_SITE_OTHER): Payer: Medicare HMO | Admitting: Physician Assistant

## 2023-02-25 ENCOUNTER — Encounter: Payer: Self-pay | Admitting: Physician Assistant

## 2023-02-25 VITALS — BP 136/68 | HR 58 | Temp 97.8°F | Resp 16 | Ht <= 58 in | Wt 113.1 lb

## 2023-02-25 DIAGNOSIS — N390 Urinary tract infection, site not specified: Secondary | ICD-10-CM

## 2023-02-25 DIAGNOSIS — K5792 Diverticulitis of intestine, part unspecified, without perforation or abscess without bleeding: Secondary | ICD-10-CM | POA: Diagnosis not present

## 2023-02-25 LAB — CBC WITH DIFFERENTIAL/PLATELET
Absolute Monocytes: 655 {cells}/uL (ref 200–950)
Basophils Absolute: 0 {cells}/uL (ref 0–200)
Basophils Relative: 0 %
Eosinophils Absolute: 0 {cells}/uL — ABNORMAL LOW (ref 15–500)
Eosinophils Relative: 0 %
HCT: 30.4 % — ABNORMAL LOW (ref 35.0–45.0)
Hemoglobin: 9.6 g/dL — ABNORMAL LOW (ref 11.7–15.5)
Lymphs Abs: 2708 {cells}/uL (ref 850–3900)
MCH: 28.3 pg (ref 27.0–33.0)
MCHC: 31.6 g/dL — ABNORMAL LOW (ref 32.0–36.0)
MCV: 89.7 fL (ref 80.0–100.0)
MPV: 11.1 fL (ref 7.5–12.5)
Monocytes Relative: 11.1 %
Neutro Abs: 2537 {cells}/uL (ref 1500–7800)
Neutrophils Relative %: 43 %
Platelets: 205 10*3/uL (ref 140–400)
RBC: 3.39 10*6/uL — ABNORMAL LOW (ref 3.80–5.10)
RDW: 14.2 % (ref 11.0–15.0)
Total Lymphocyte: 45.9 %
WBC: 5.9 10*3/uL (ref 3.8–10.8)

## 2023-02-25 LAB — COMPREHENSIVE METABOLIC PANEL
AG Ratio: 1.6 (calc) (ref 1.0–2.5)
ALT: 10 U/L (ref 6–29)
AST: 15 U/L (ref 10–35)
Albumin: 3.9 g/dL (ref 3.6–5.1)
Alkaline phosphatase (APISO): 97 U/L (ref 37–153)
BUN/Creatinine Ratio: 27 (calc) — ABNORMAL HIGH (ref 6–22)
BUN: 29 mg/dL — ABNORMAL HIGH (ref 7–25)
CO2: 24 mmol/L (ref 20–32)
Calcium: 8.5 mg/dL — ABNORMAL LOW (ref 8.6–10.4)
Chloride: 107 mmol/L (ref 98–110)
Creat: 1.06 mg/dL — ABNORMAL HIGH (ref 0.60–0.95)
Globulin: 2.5 g/dL (ref 1.9–3.7)
Glucose, Bld: 100 mg/dL — ABNORMAL HIGH (ref 65–99)
Potassium: 4 mmol/L (ref 3.5–5.3)
Sodium: 138 mmol/L (ref 135–146)
Total Bilirubin: 0.3 mg/dL (ref 0.2–1.2)
Total Protein: 6.4 g/dL (ref 6.1–8.1)

## 2023-02-25 LAB — POC URINALSYSI DIPSTICK (AUTOMATED)
Bilirubin, UA: NEGATIVE
Blood, UA: NEGATIVE
Glucose, UA: NEGATIVE
Ketones, UA: NEGATIVE
Nitrite, UA: NEGATIVE
Protein, UA: NEGATIVE
Spec Grav, UA: 1.01 (ref 1.010–1.025)
Urobilinogen, UA: 0.2 U/dL
pH, UA: 5 (ref 5.0–8.0)

## 2023-02-25 MED ORDER — AMOXICILLIN-POT CLAVULANATE 875-125 MG PO TABS
1.0000 | ORAL_TABLET | Freq: Two times a day (BID) | ORAL | 0 refills | Status: AC
Start: 2023-02-25 — End: 2023-03-04

## 2023-02-25 NOTE — Progress Notes (Signed)
Established patient visit   Patient: Julie Wilkerson   DOB: [birth year]   82 y.o. Female  MRN: [id number] Visit Date: 02/25/2023  Today's healthcare provider: Alfredia Ferguson, PA-C   Cc. Abdominal pain  Subjective    HPI  Pt was seen 9/25 for dysuria, abdominal pain. CT scan was consistent w/ acute divertic, advised to start augmentin as prescribed by a previous provider.   Urine culture returned citrobacter freundii resistant to amox, one dose of fosfomycin 3 g was sent to treat UTI.  Pt reports she stopped the amoxicillin when she started the fossfomycin. Only took 4 days of amox. Initially was feeling better- but then, nausea, fatigue, weakness, worsening LLQ abdominal pain and L lower back pain. Reports issues w/ memory and concentration.  Reports one day of BP 150/ ? She took lisinopril 10 mg. Reports she usually takes it only when her pressure is up .  Denies any lower leg edema, denies needing lasix. Reports last BM was today. Nausea but no vomiting.  Medications: Outpatient Medications Prior to Visit  Medication Sig Note   ASPIRIN 81 PO 81 mg daily.     citalopram (CELEXA) 20 MG tablet Take 1 tablet (20 mg total) by mouth at bedtime.    fexofenadine (ALLEGRA ALLERGY) 180 MG tablet Take 1 tablet (180 mg total) by mouth daily as needed for allergies or rhinitis.    FLOVENT HFA 110 MCG/ACT inhaler INHALE 1 TO 2 PUFFS BY MOUTH TWICE DAILY AS NEEDED    furosemide (LASIX) 40 MG tablet 1 tablet every other day    hyoscyamine (LEVSIN SL) 0.125 MG SL tablet Use at least twice a day    levothyroxine (SYNTHROID) 50 MCG tablet Take 1 tablet (50 mcg total) by mouth daily before breakfast.    omega-3 acid ethyl esters (LOVAZA) 1 g capsule Take 2 g by mouth daily.    omeprazole (PRILOSEC) 40 MG capsule Take 1 capsule (40 mg total) by mouth 2 (two) times daily.    Peppermint Oil (IBGARD) 90 MG CPCR Use as directed    potassium chloride (KLOR-CON M) 10 MEQ tablet Take 1 tablet by mouth  once daily    sucralfate (CARAFATE) 1 g tablet Take 1 tablet by mouth twice daily    Vitamin D, Ergocalciferol, (DRISDOL) 1.25 MG (50000 UNIT) CAPS capsule Take 1 capsule (50,000 Units total) by mouth every 7 (seven) days. (Patient not taking: Reported on 02/25/2023)    [DISCONTINUED] amoxicillin-clavulanate (AUGMENTIN) 875-125 MG tablet Take 1 tablet by mouth 2 (two) times daily. (Patient not taking: Reported on 02/25/2023) 02/25/2023: Didn't complete Augmentin- she misunderstood and thought fosfomycin would help w/ diverticulitis and uti    No facility-administered medications prior to visit.    Review of Systems  Constitutional:  Positive for fatigue. Negative for fever.  Respiratory:  Negative for cough and shortness of breath.   Cardiovascular:  Negative for chest pain and leg swelling.  Gastrointestinal:  Positive for abdominal pain and nausea.  Neurological:  Positive for weakness. Negative for dizziness and headaches.      Objective    BP 136/68   Pulse (!) 58   Temp 97.8 F (36.6 C) (Oral)   Resp 16   Ht 4\' 10"  (1.473 m)   Wt 113 lb 2 oz (51.3 kg)   SpO2 97%   BMI 23.64 kg/m   Physical Exam Constitutional:      General: She is awake.     Appearance: She is well-developed.  HENT:     Head: Normocephalic.  Eyes:     Conjunctiva/sclera: Conjunctivae normal.  Cardiovascular:     Rate and Rhythm: Normal rate and regular rhythm.     Heart sounds: Normal heart sounds.  Pulmonary:     Effort: Pulmonary effort is normal.     Breath sounds: Normal breath sounds.  Abdominal:     Comments: Generalized tenderness. Pt reports worse LLQ. No guarding, no distention.  No CVA tenderness  Skin:    General: Skin is warm.  Neurological:     Mental Status: She is alert and oriented to person, place, and time.  Psychiatric:        Attention and Perception: Attention normal.        Mood and Affect: Mood normal.        Speech: Speech normal.        Behavior: Behavior is  cooperative.      Results for orders placed or performed in visit on 02/25/23  POCT Urinalysis Dipstick (Automated)  Result Value Ref Range   Color, UA Straw    Clarity, UA Cloudy    Glucose, UA Negative Negative   Bilirubin, UA Negative    Ketones, UA Negative    Spec Grav, UA 1.010 1.010 - 1.025   Blood, UA Negative    pH, UA 5.0 5.0 - 8.0   Protein, UA Negative Negative   Urobilinogen, UA 0.2 0.2 or 1.0 E.U./dL   Nitrite, UA Negative    Leukocytes, UA Small (1+) (A) Negative    Assessment & Plan    1. Urinary tract infection without hematuria, site unspecified Given UA showed small leuks, repeat culture - Urine Culture - POCT Urinalysis Dipstick (Automated)  2. Acute diverticulitis Her symptoms are most consistent w/ a continued divertic flare-- complicated by anxiety.  Once pt started talking and was calmed down she seemed in less discomfort.   Advised a full 7 days of augmentin, sent in a new rx.  Advised all liquid diet for the next 2-3 days.  Given ED precautions w/ fever, worsening pain, no BM - amoxicillin-clavulanate (AUGMENTIN) 875-125 MG tablet; Take 1 tablet by mouth 2 (two) times daily for 7 days.  Dispense: 14 tablet; Refill: 0 - CBC w/Diff - Comp Met (CMET)   Return if symptoms worsen or fail to improve.      I, Alfredia Ferguson, PA-C have reviewed all documentation for this visit. The documentation on  02/25/23   for the exam, diagnosis, procedures, and orders are all accurate and complete.    Alfredia Ferguson, PA-C  [facility] at [facility] [phone] (phone) [phone] (fax)  [facility]

## 2023-02-26 LAB — URINE CULTURE
MICRO NUMBER:: 15554029
SPECIMEN QUALITY:: ADEQUATE

## 2023-02-28 ENCOUNTER — Other Ambulatory Visit: Payer: Self-pay | Admitting: Physician Assistant

## 2023-02-28 ENCOUNTER — Ambulatory Visit: Payer: Medicare HMO | Attending: Internal Medicine | Admitting: Internal Medicine

## 2023-02-28 ENCOUNTER — Telehealth: Payer: Self-pay | Admitting: Family Medicine

## 2023-02-28 ENCOUNTER — Other Ambulatory Visit: Payer: Medicare HMO

## 2023-02-28 DIAGNOSIS — D649 Anemia, unspecified: Secondary | ICD-10-CM

## 2023-02-28 DIAGNOSIS — K5792 Diverticulitis of intestine, part unspecified, without perforation or abscess without bleeding: Secondary | ICD-10-CM

## 2023-02-28 NOTE — Telephone Encounter (Signed)
[facility] states they only received 3/14 pgs of the referral and would like the remaining pages faxed. Fax # is [phone].

## 2023-02-28 NOTE — Progress Notes (Deleted)
Cardiology Office Note:    Date:  02/28/2023   ID:  Julie Wilkerson, DOB 05-15-1941, MRN [id number]  PCP:  Bradd Canary, MD   [facility] Providers Cardiologist:  Maisie Fus, MD     Referring MD: Bradd Canary, MD   No chief complaint on file. Chest pain  History of Present Illness:   Initial Visit Julie Wilkerson is a 82 y.o. female with a hx of HTN, non obstructive CAD, central sleep apnea uses CPAP, hypothyroidism,  breast cancer survivor, comes in today for chest pain in her chest and side.Her left arm gets numb. This has been going on for 1 year. Sometimes it is two days in a row, then it stops.It lasts for seconds to minutes. She rubs it and moves her arm. That improves it. He was getting speciality messages that helped with the pain. During the day she does house cleaning , walks in the neighborhood. She has back pain. She noted LH,dizzy sometimes with headaches. No dyspnea on exertion. She can walk up a flight of stairs fine. Her BP at home 120s. No orthopnea, PND, no LE edema. She cannot sleep with her CPAP.  Cardiac Hx: She underwent cath 02/09/2019 prior to hip arthroplasty procedure. She had mild-moderate non obstructive dx. No evidence of myocardial bridge. Normal filling pressures. Saw Dr. Tresa Endo for a sleep study who recommended CPAP.    Interim Hx: Her pain has not progressed. But it has not resolved. Her house cleaning is going well. No symptoms with activity  Interim hx 11/05/2021 She was seen in the ED on 11/02/2021 for chest pain. Notes substernal discomfort that radiates to her back. She was having DOE. She notes resolution of chest pain. Notes swelling in her ankles that occurs in the afternoon for which she wears compression stockings. She is struggling with her CPAP. Blood pressure is well controlled. She also reported pain in her R calf with carrying groceries as well as her thighs  Interim 02/24/2022 She recently had COVID and managed for this. Otherwise she  feels well.  Interim hx 02/28/2023 ***   Past Medical History:  Diagnosis Date   Abdominal pain 11/07/2016   Accelerating angina (HCC) 02/09/2019   Acute diastolic CHF (congestive heart failure) (HCC) 02/16/2019   Acute on chronic diastolic CHF (congestive heart failure) (HCC) 02/23/2019   Acute pansinusitis 04/26/2017   Amblyopia of eye, left 04/19/2018   Anemia    Anginal pain (HCC)    Anxiety    Arthritis    Asthma    Atypical chest pain 02/04/2019   Bilateral carotid bruits 03/28/2017   Blood transfusion without reported diagnosis    Breast cancer (HCC)    2002, left, encapsulated, microcalcifications. lumpectomy, radtiation x 30   Breast cancer in female [facility])    Bursitis of left hip 09/04/2018   Change in bowel habits 02/22/2018   Change in mole 06/08/2016   Chest pain 03/04/2020   CHF (congestive heart failure) (HCC)    Chronic bilateral thoracic back pain 05/23/2016   Chronic diastolic congestive heart failure (HCC) 05/28/2019   Colitis    Complication of anesthesia    VERY SENSITIVE TO ANESTHESIA    Constipation 01/17/2018   Coronary artery disease involving native coronary artery of native heart without angina pectoris 05/28/2019   Cortical age-related cataract of right eye 04/18/2018   Cough variant asthma  vs UACS from ACEi     Spirometry 12/02/2016  FEV1 1.26 (76%)  Ratio 78 with  min curvature p last saba > 6 h prior  - trial off acei 12/02/2016  - 12/02/2016  After extensive coaching HFA effectiveness =    75% with qvar autohaler > rechallenge with 80 2bid     Decreased visual acuity 01/16/2017   Degeneration of lumbar intervertebral disc 08/01/2018   Diarrhea 11/16/2015   D/o Diverticuli, polyps, celiac disease.     Dyspnea 11/04/2016   with asthma exacerbation only   Dysuria 11/04/2016   Elevated sed rate 10/23/2017   Essential hypertension    Trial off acei 12/02/2016 due to cough/ pseudoasthma   GERD (gastroesophageal reflux disease)    Gluten  intolerance    History of chicken pox    History of Helicobacter pylori infection 08/01/2015   History of rectal bleeding 06/01/2019   Hx of LASIK 04/24/2019   Hyperglycemia 11/07/2016   Hyperlipidemia    Hypertension    Hyponatremia 02/16/2019   Hypothyroidism    IBS (irritable bowel syndrome)    Lattice degeneration of right retina 04/24/2019   Left hand pain 05/07/2018   Left hip pain 03/01/2017   Left leg pain 03/01/2017   Left shoulder pain 05/07/2018   Low back pain 08/10/2015   LUQ pain 06/01/2019   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   Neck pain 03/01/2017   Nuclear sclerotic cataract of right eye 04/18/2018   Osteoporosis    Overweight (BMI 25.0-29.9) 02/14/2019   Pain in joint of left shoulder 08/11/2018   Paroxysmal atrial tachycardia (HCC) 05/28/2019   Pelvic pain 07/02/2018   Personal history of radiation therapy    Pneumonia    PONV (postoperative nausea and vomiting)    Poor appetite 02/22/2018   Pseudophakia of left eye 04/18/2018   PVD (posterior vitreous detachment), right 04/24/2019   Restless sleeper 11/07/2016   Right hip pain 10/23/2017   RLQ discomfort 03/01/2017   RLS (restless legs syndrome) 05/07/2019   S/P left THA, AA 02/13/2019   Sepsis due to pneumonia (HCC) 02/16/2019   Sleep apnea    uses CPAP   Stomach cramps    Tremor 02/15/2016   Urinary frequency 12/22/2017   Urinary incontinence 05/07/2019   Urinary tract infection 11/04/2016   Vitamin D deficiency 04/29/2016    Past Surgical History:  Procedure Laterality Date   ABDOMINAL HYSTERECTOMY     partial   APPENDECTOMY     BREAST LUMPECTOMY Left 2002   CHOLECYSTECTOMY  Age 85 or 41   COLONOSCOPY  2017   EYE SURGERY Left    cataract   LEFT HEART CATH AND CORONARY ANGIOGRAPHY N/A 02/09/2019   Procedure: LEFT HEART CATH AND CORONARY ANGIOGRAPHY;  Surgeon: Yvonne Kendall, MD;  Location: [facility];  Service: Cardiovascular;   Laterality: N/A;   TONSILLECTOMY     TOTAL HIP ARTHROPLASTY Left 02/13/2019   Procedure: TOTAL HIP ARTHROPLASTY ANTERIOR APPROACH;  Surgeon: Durene Romans, MD;  Location: [facility];  Service: Orthopedics;  Laterality: Left;  70 mins   UPPER GASTROINTESTINAL ENDOSCOPY      Current Medications: No outpatient medications have been marked as taking for the 02/28/23 encounter (Appointment) with Maisie Fus, MD.     Allergies:   Valium [diazepam] and Albuterol and levalbuterol   Social History   Socioeconomic History   Marital status: Single    Spouse name: Not on file   Number of children: 3   Years of education: 9   Highest education level: Master's degree (e.g., MA, MS,  MEng, MEd, MSW, MBA)  Occupational History   Occupation: retired    Comment: Runner, broadcasting/film/video, kindergarten  Tobacco Use   Smoking status: Never   Smokeless tobacco: Never  Vaping Use   Vaping status: Never Used  Substance and Sexual Activity   Alcohol use: Never    Alcohol/week: 0.0 standard drinks of alcohol   Drug use: Never   Sexual activity: Not Currently    Birth control/protection: None    Comment: lives alone, avoids daiiry and gluten. 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  Other Topics Concern   Not on file  Social History Narrative   Not on file   Social Determinants of Health   Financial Resource Strain: Low Risk  (12/19/2020)   Overall Financial Resource Strain (CARDIA)    Difficulty of Paying Living Expenses: Not hard at all  Food Insecurity: No Food Insecurity (04/28/2022)   Hunger Vital Sign    Worried About Running Out of Food in the Last Year: Never true    Ran Out of Food in the Last Year: Never true  Transportation Needs: No Transportation Needs (04/28/2022)   PRAPARE - Administrator, Civil Service (Medical): No    Lack of Transportation (Non-Medical): No  Physical Activity: Insufficiently Active (12/19/2020)   Exercise Vital Sign    Days of Exercise per Week: 1 day    Minutes of Exercise per  Session: 30 min  Stress: No Stress Concern Present (12/19/2020)   [employer] of Occupational Health - Occupational Stress Questionnaire    Feeling of Stress : Not at all  Social Connections: Moderately Integrated (03/29/2022)   Social Connection and Isolation Panel [NHANES]    Frequency of Communication with Friends and Family: More than three times a week    Frequency of Social Gatherings with Friends and Family: More than three times a week    Attends Religious Services: More than 4 times per year    Active Member of [employer] or Organizations: Yes    Attends Engineer, structural: More than 4 times per year    Marital Status: Divorced     Family History: The patient's family history includes Arthritis in her daughter; Colitis in her mother; Colon cancer in her paternal grandmother; Heart attack in her brother, brother, and brother; Heart disease in her daughter; Hyperlipidemia in her brother, brother, brother, and sister; Hypertension in her father; Irritable bowel syndrome in her mother; Leukemia in her daughter; Stomach cancer (age of onset: 29) in her paternal grandmother. There is no history of Esophageal cancer, Pancreatic cancer, or Rectal cancer.  ROS:   Please see the history of present illness.     All other systems reviewed and are negative.  EKGs/Labs/Other Studies Reviewed:    The following studies were reviewed today:   EKG:  EKG is  ordered today.  The ekg ordered today demonstrates   Prior EKGs Sinus bradycardia,non specific ST-T changes Sinus bradycardia    Coronary CTA 01/13/2022 coronary CTA with low CAC score 2.52.   TTE 01/13/21 Normal LV fxn Normal RV fxn No valve dx Normal IVC  Cath 02/09/2019 - LHC mild-moderate non obstructive dx  Cardiac Monitor: palpitations 04/03/2019 9 SVTlongest 13 beats. No atrial fibrillation, no block. No VT.  SPECT 01/18/2017 -Normal  03/29/2017 BL carotid US negative   Recent Labs: 11/09/2022: Pro B  Natriuretic peptide (BNP) 92.0; TSH 1.90 02/25/2023: ALT 10; BUN 29; Creat 1.06; Hemoglobin 9.6; Platelets 205; Potassium 4.0; Sodium 138   Recent Lipid Panel  Component Value Date/Time   CHOL 261 (H) 11/09/2022 1553   TRIG 173.0 (H) 11/09/2022 1553   HDL 60.90 11/09/2022 1553   CHOLHDL 4 11/09/2022 1553   VLDL 34.6 11/09/2022 1553   LDLCALC 165 (H) 11/09/2022 1553   LDLCALC 79 02/01/2020 1020     Risk Assessment/Calculations:           Physical Exam:    VS:   There were no vitals filed for this visit.    Wt Readings from Last 3 Encounters:  02/25/23 113 lb 2 oz (51.3 kg)  02/16/23 111 lb (50.3 kg)  01/18/23 112 lb 3.2 oz (50.9 kg)     GEN:  Well nourished, well developed in no acute distress HEENT: Normal NECK: No JVD; No carotid bruits CARDIAC: RRR, no murmurs, rubs, gallops RESPIRATORY:  Clear to auscultation without rales, wheezing or rhonchi  ABDOMEN: Soft, non-tender, non-distended MUSCULOSKELETAL:  No edema; No deformity  SKIN: Warm and dry NEUROLOGIC:  Alert and oriented x 3 PSYCHIATRIC:  Normal affect   ASSESSMENT:    #CP/DOE: had prior MSK pain. Now with more progressive symptoms including DOE and chest pain. Coronary CTA was done and did not show a cardiac cause.  #Leg Cramping: ABI was normal  HTN: continue lisinopril 10 mg at night, metop XL 25 mg daily.   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 Continue to encourage compression stockings.   So far she's had a full normal cardiac w/u.  PLAN:    In order of problems listed above:  Follow up PRN   Medication Adjustments/Labs and Tests Ordered: Current medicines are reviewed at length with the patient today.  Concerns regarding medicines are outlined above.    Signed, Maisie Fus, MD  02/28/2023 9:52 AM

## 2023-03-01 ENCOUNTER — Encounter: Payer: Self-pay | Admitting: Cardiology

## 2023-03-01 DIAGNOSIS — K5792 Diverticulitis of intestine, part unspecified, without perforation or abscess without bleeding: Secondary | ICD-10-CM | POA: Diagnosis not present

## 2023-03-01 DIAGNOSIS — R109 Unspecified abdominal pain: Secondary | ICD-10-CM | POA: Diagnosis not present

## 2023-03-01 DIAGNOSIS — K602 Anal fissure, unspecified: Secondary | ICD-10-CM | POA: Diagnosis not present

## 2023-03-01 DIAGNOSIS — R197 Diarrhea, unspecified: Secondary | ICD-10-CM | POA: Diagnosis not present

## 2023-03-01 DIAGNOSIS — K580 Irritable bowel syndrome with diarrhea: Secondary | ICD-10-CM | POA: Diagnosis not present

## 2023-03-02 ENCOUNTER — Telehealth: Payer: Self-pay | Admitting: Family Medicine

## 2023-03-02 ENCOUNTER — Other Ambulatory Visit: Payer: Self-pay | Admitting: Family Medicine

## 2023-03-02 NOTE — Telephone Encounter (Signed)
Copied from CRM [id number]. Topic: Medicare AWV >> Mar 02, 2023 10:34 AM Payton Doughty wrote: Reason for CRM: Called LVM 03/02/2023 to schedule AWV   Verlee Rossetti; Care Guide Ambulatory Clinical Support [place] l [facility] Direct Dial: [phone]

## 2023-03-10 DIAGNOSIS — Z90710 Acquired absence of both cervix and uterus: Secondary | ICD-10-CM | POA: Diagnosis not present

## 2023-03-10 DIAGNOSIS — K5730 Diverticulosis of large intestine without perforation or abscess without bleeding: Secondary | ICD-10-CM | POA: Diagnosis not present

## 2023-03-10 DIAGNOSIS — Z9089 Acquired absence of other organs: Secondary | ICD-10-CM | POA: Diagnosis not present

## 2023-03-10 DIAGNOSIS — K449 Diaphragmatic hernia without obstruction or gangrene: Secondary | ICD-10-CM | POA: Diagnosis not present

## 2023-03-10 DIAGNOSIS — Z9049 Acquired absence of other specified parts of digestive tract: Secondary | ICD-10-CM | POA: Diagnosis not present

## 2023-03-10 DIAGNOSIS — K760 Fatty (change of) liver, not elsewhere classified: Secondary | ICD-10-CM | POA: Diagnosis not present

## 2023-03-15 DIAGNOSIS — H1045 Other chronic allergic conjunctivitis: Secondary | ICD-10-CM | POA: Diagnosis not present

## 2023-03-15 DIAGNOSIS — H0288A Meibomian gland dysfunction right eye, upper and lower eyelids: Secondary | ICD-10-CM | POA: Diagnosis not present

## 2023-03-15 DIAGNOSIS — H04123 Dry eye syndrome of bilateral lacrimal glands: Secondary | ICD-10-CM | POA: Diagnosis not present

## 2023-03-15 DIAGNOSIS — H0288B Meibomian gland dysfunction left eye, upper and lower eyelids: Secondary | ICD-10-CM | POA: Diagnosis not present

## 2023-03-25 ENCOUNTER — Encounter: Payer: Self-pay | Admitting: Physician Assistant

## 2023-03-25 ENCOUNTER — Ambulatory Visit (INDEPENDENT_AMBULATORY_CARE_PROVIDER_SITE_OTHER): Payer: Medicare HMO | Admitting: Physician Assistant

## 2023-03-25 VITALS — BP 113/71 | HR 76 | Temp 97.8°F | Ht <= 58 in | Wt 110.2 lb

## 2023-03-25 DIAGNOSIS — J4541 Moderate persistent asthma with (acute) exacerbation: Secondary | ICD-10-CM

## 2023-03-25 MED ORDER — PREDNISONE 20 MG PO TABS
20.0000 mg | ORAL_TABLET | Freq: Every day | ORAL | 0 refills | Status: DC
Start: 2023-03-25 — End: 2023-04-04

## 2023-03-25 NOTE — Progress Notes (Signed)
Established patient visit   Patient: Julie Wilkerson   DOB: [birth year]   82 y.o. Female  MRN: [id number] Visit Date: 03/25/2023  Today's healthcare provider: Alfredia Ferguson, PA-C   Cc. SOB, wheezing  Subjective     Pt with a history of asthma and CHF, managed with flovent prn.  She reports over the last couple of days having an increase in her cough and some chest tightness.  She has been using her Flovent twice to 4 times a day as needed which helps control some of her symptoms.  Reports a little bit more fatigued than usual.  Denies significant shortness of breath, fever, chest pain.  Patient reports she cannot use albuterol due to an allergy/hypersensitivity  Medications: Outpatient Medications Prior to Visit  Medication Sig   ASPIRIN 81 PO 81 mg daily.    citalopram (CELEXA) 20 MG tablet Take 1 tablet (20 mg total) by mouth at bedtime.   fexofenadine (ALLEGRA ALLERGY) 180 MG tablet Take 1 tablet (180 mg total) by mouth daily as needed for allergies or rhinitis.   FLOVENT HFA 110 MCG/ACT inhaler INHALE 1 TO 2 PUFFS BY MOUTH TWICE DAILY AS NEEDED   furosemide (LASIX) 40 MG tablet 1 tablet every other day   hyoscyamine (LEVSIN SL) 0.125 MG SL tablet Use at least twice a day   levothyroxine (SYNTHROID) 50 MCG tablet Take 1 tablet (50 mcg total) by mouth daily before breakfast.   omega-3 acid ethyl esters (LOVAZA) 1 g capsule Take 2 g by mouth daily.   omeprazole (PRILOSEC) 40 MG capsule Take 1 capsule (40 mg total) by mouth 2 (two) times daily.   Peppermint Oil (IBGARD) 90 MG CPCR Use as directed   potassium chloride (KLOR-CON M) 10 MEQ tablet Take 1 tablet by mouth once daily   sucralfate (CARAFATE) 1 g tablet Take 1 tablet by mouth twice daily   Vitamin D, Ergocalciferol, (DRISDOL) 1.25 MG (50000 UNIT) CAPS capsule Take 1 capsule (50,000 Units total) by mouth every 7 (seven) days.   No facility-administered medications prior to visit.    Review of Systems  Constitutional:   Negative for fatigue and fever.  Respiratory:  Positive for cough, shortness of breath and wheezing.   Cardiovascular:  Negative for chest pain and leg swelling.  Gastrointestinal:  Negative for abdominal pain.  Neurological:  Negative for dizziness and headaches.       Objective    BP 113/71   Pulse 76   Temp 97.8 F (36.6 C) (Oral)   Ht 4\' 10"  (1.473 m)   Wt 110 lb 4 oz (50 kg)   SpO2 97%   BMI 23.04 kg/m    Physical Exam Constitutional:      General: She is awake.     Appearance: She is well-developed.  HENT:     Head: Normocephalic.  Eyes:     Conjunctiva/sclera: Conjunctivae normal.  Cardiovascular:     Rate and Rhythm: Normal rate and regular rhythm.     Heart sounds: Normal heart sounds.  Pulmonary:     Effort: Pulmonary effort is normal.     Breath sounds: Examination of the left-lower field reveals wheezing. Wheezing present.     Comments: Mild wheezing left lower lobe Skin:    General: Skin is warm.  Neurological:     Mental Status: She is alert and oriented to person, place, and time.  Psychiatric:        Attention and Perception: Attention normal.  Mood and Affect: Mood normal.        Speech: Speech normal.        Behavior: Behavior is cooperative.      No results found for any visits on 03/25/23.  Assessment & Plan    Moderate persistent asthma with exacerbation -     predniSONE; Take 1 tablet (20 mg total) by mouth daily with breakfast.  Dispense: 5 tablet; Refill: 0  Vital stable today given patient cannot take albuterol, continue Flovent and will send in 20 mg of prednisone for the next 5 days  Advised to follow-up if she does not improve, develops a fever, worsening symptoms  Return if symptoms worsen or fail to improve.       Alfredia Ferguson, PA-C  [facility] [phone] (phone) [phone] (fax)  [facility]

## 2023-03-28 ENCOUNTER — Other Ambulatory Visit: Payer: Self-pay | Admitting: Medical

## 2023-04-04 ENCOUNTER — Ambulatory Visit (INDEPENDENT_AMBULATORY_CARE_PROVIDER_SITE_OTHER): Payer: Medicare HMO | Admitting: Medical

## 2023-04-04 VITALS — BP 144/60 | HR 64 | Ht <= 58 in | Wt 109.0 lb

## 2023-04-04 DIAGNOSIS — R0789 Other chest pain: Secondary | ICD-10-CM

## 2023-04-04 DIAGNOSIS — R1032 Left lower quadrant pain: Secondary | ICD-10-CM

## 2023-04-04 DIAGNOSIS — R300 Dysuria: Secondary | ICD-10-CM | POA: Diagnosis not present

## 2023-04-04 DIAGNOSIS — M940 Chondrocostal junction syndrome [Tietze]: Secondary | ICD-10-CM

## 2023-04-04 LAB — COMPREHENSIVE METABOLIC PANEL
ALT: 13 U/L (ref 0–35)
AST: 20 U/L (ref 0–37)
Albumin: 4.4 g/dL (ref 3.5–5.2)
Alkaline Phosphatase: 90 U/L (ref 39–117)
BUN: 29 mg/dL — ABNORMAL HIGH (ref 6–23)
CO2: 25 meq/L (ref 19–32)
Calcium: 9 mg/dL (ref 8.4–10.5)
Chloride: 103 meq/L (ref 96–112)
Creatinine, Ser: 1.09 mg/dL (ref 0.40–1.20)
GFR: 47.69 mL/min — ABNORMAL LOW (ref >60.00)
Glucose, Bld: 93 mg/dL (ref 70–99)
Potassium: 4.3 meq/L (ref 3.5–5.1)
Sodium: 137 meq/L (ref 135–145)
Total Bilirubin: 0.5 mg/dL (ref 0.2–1.2)
Total Protein: 7.8 g/dL (ref 6.0–8.3)

## 2023-04-04 LAB — CBC WITH DIFFERENTIAL/PLATELET
Basophils Absolute: 0.1 10*3/uL (ref 0.0–0.1)
Basophils Relative: 0.8 % (ref 0.0–3.0)
Eosinophils Absolute: 0 10*3/uL (ref 0.0–0.7)
Eosinophils Relative: 0 % (ref 0.0–5.0)
HCT: 37.3 % (ref 36.0–46.0)
Hemoglobin: 12.3 g/dL (ref 12.0–15.0)
Lymphocytes Relative: 26.8 % (ref 12.0–46.0)
Lymphs Abs: 1.8 10*3/uL (ref 0.7–4.0)
MCHC: 32.8 g/dL (ref 30.0–36.0)
MCV: 88.7 fL (ref 78.0–100.0)
Monocytes Absolute: 0.9 10*3/uL (ref 0.1–1.0)
Monocytes Relative: 12.6 % — ABNORMAL HIGH (ref 3.0–12.0)
Neutro Abs: 4.1 10*3/uL (ref 1.4–7.7)
Neutrophils Relative %: 59.8 % (ref 43.0–77.0)
Platelets: 247 10*3/uL (ref 150.0–400.0)
RBC: 4.21 Mil/uL (ref 3.87–5.11)
RDW: 15.6 % — ABNORMAL HIGH (ref 11.5–15.5)
WBC: 6.8 10*3/uL (ref 4.0–10.5)

## 2023-04-04 LAB — POCT URINALYSIS DIPSTICK
Bilirubin, UA: NEGATIVE
Glucose, UA: NEGATIVE
Ketones, UA: NEGATIVE
Nitrite, UA: NEGATIVE
Protein, UA: NEGATIVE
Spec Grav, UA: 1.01 (ref 1.010–1.025)
Urobilinogen, UA: 0.2 U/dL
pH, UA: 5 (ref 5.0–8.0)

## 2023-04-04 LAB — TROPONIN I (HIGH SENSITIVITY): High Sens Troponin I: 8 ng/L (ref 2–17)

## 2023-04-04 MED ORDER — CIPROFLOXACIN HCL 500 MG PO TABS: 500.0000 mg | ORAL_TABLET | Freq: Two times a day (BID) | ORAL | 0 refills | Status: DC

## 2023-04-04 MED ORDER — METHYLPREDNISOLONE 4 MG PO TABS: ORAL_TABLET | ORAL | 0 refills | Status: DC

## 2023-04-04 NOTE — Patient Instructions (Addendum)
Urinary Tract Infection Dysuria, frequency, and urgency for 3 days. Leukocytes and blood in urine. No fever or flank pain. -Start Ciprofloxacin 500mg  BID for 7 days. -Send urine for culture. -Repeat urinalysis in 2 weeks to ensure resolution of hematuria. If persistent, refer to Urology.  Diverticulosis History of diverticulitis. Recent CT showed diverticulosis without convincing evidence of diverticulitis. Mild left lower quadrant tenderness on exam. -Order CBC to assess for leukocytosis. -If leukocytosis or worsening pain, add Flagyl to Ciprofloxacin regimen.  Costochondritis Intermittent sharp left-sided chest pain, reproducible on exam. No associated symptoms suggestive of cardiac ischemia as reviewed in depth. EKG showed bradycardia without ischemic changes. -Orde1 set troponin only for caution. -Start Medrol 4mg  taper for 3 days. -Follow-up by my chart  04/07/2023 to assess response to therapy.  Follow up in office to be determined after lab review.

## 2023-04-04 NOTE — Progress Notes (Signed)
Subjective:    Patient ID: Julie Wilkerson, female    DOB: Apr 11, 1941, 82 y.o.   MRN: [id number]  HPI  Discussed the use of AI scribe software for clinical note transcription with the patient, who gave verbal consent to proceed.  History of Present Illness   The patient, with a history of diverticulitis, presented with symptoms suggestive of a urinary tract infection (UTI) for the past three days. She reported experiencing burning sensations during urination, increased urinary frequency, and occasional urinary urgency leading to incontinence. The patient also noted left-sided flank pain, which she associated with her history of diverticulitis.  In addition to the urinary symptoms, the patient reported intermittent, sharp, and intense pain in the left upper chest, which was exacerbated by coughing or taking deep breaths. The pain was described as coming and going randomly throughout the day.Bbrief lating only seconds.   The patient's past medical history includes a diagnosis of diverticulitis. However, a recent CT scan in mid-October did not provide convincing evidence of active diverticulitis. The patient had a significant amount of stool in the colon at the time of the scan.  The patient's symptoms, particularly the urinary symptoms, were consistent with a UTI. The patient had large leukocytes and some blood in her urine.  The patient's chest pain was thought to be due to costochondritis, given the reproducibility of the pain on physical examination and the absence of ischemic changes on EKG.       Review of Systems  Constitutional:  Negative for chills, fatigue and fever.  HENT:  Negative for congestion.   Respiratory:  Negative for cough and wheezing.   Cardiovascular:  Negative for chest pain and palpitations.       See hpi. Costochondriits type pain  Gastrointestinal:  Positive for abdominal pain. Negative for constipation, diarrhea, nausea and vomiting.  Genitourinary:  Positive for  dysuria, frequency and urgency.  Musculoskeletal:  Positive for back pain.       See hpi and exam  Neurological:  Negative for dizziness.  Hematological:  Negative for adenopathy. Does not bruise/bleed easily.  Psychiatric/Behavioral:  Negative for behavioral problems and confusion.        Objective:   Physical Exam  General Mental Status- Alert. General Appearance- Not in acute distress.   Skin General: Color- Normal Color. Moisture- Normal Moisture.  Neck Carotid Arteries- Normal color. Moisture- Normal Moisture. No carotid bruits. No JVD.  Chest and Lung Exam Auscultation: Breath Sounds:-Normal.  Cardiovascular Auscultation:Rythm- Regular. Murmurs & Other Heart Sounds:Auscultation of the heart reveals- No Murmurs.  Abdomen Inspection:-Inspeection Normal. Palpation/Percussion:Note:No mass. Palpation and Percussion of the abdomen reveal- moderate suprapubic Tender and faint  lower quadrant left side tender, Non Distended + BS, no rebound or guarding.  Back- left side back pain. Upper cva region/mid back junction. No exactly in cva area/  Neurologic Cranial Nerve exam:- CN III-XII intact(No nystagmus), symmetric smile. Strength:- 5/5 equal and symmetric strength both upper and lower extremities.       Assessment & Plan:   Assessment and Plan    Urinary Tract Infection Dysuria, frequency, and urgency for 3 days. Leukocytes and blood in urine. No fever or flank pain. -Start Ciprofloxacin 500mg  BID for 7 days. -Send urine for culture. -Repeat urinalysis in 2 weeks to ensure resolution of hematuria. If persistent, refer to Urology.  Diverticulosis History of diverticulitis. Recent CT showed diverticulosis without convincing evidence of diverticulitis. Mild left lower quadrant tenderness on exam. -Order CBC to assess for leukocytosis. -If leukocytosis or  worsening pain, add Flagyl to Ciprofloxacin regimen.  Costochondritis Intermittent sharp left-sided chest pain,  reproducible on exam. No associated symptoms suggestive of cardiac ischemia as reviewed in depth. EKG showed bradycardia without ischemic changes. -Orde1 set troponin only for caution. -Start Medrol 4mg  taper for 3 days. -Follow-up on 04/07/2023 to assess response to therapy.        Follow up date to be determined after lab review

## 2023-04-05 LAB — URINE CULTURE
MICRO NUMBER:: 15712629
Result:: NO GROWTH
SPECIMEN QUALITY:: ADEQUATE

## 2023-04-11 ENCOUNTER — Other Ambulatory Visit: Payer: Self-pay

## 2023-04-11 ENCOUNTER — Ambulatory Visit: Payer: Medicare HMO | Attending: Obstetrics and Gynecology | Admitting: Physical Therapy

## 2023-04-11 ENCOUNTER — Encounter: Payer: Self-pay | Admitting: Physical Therapy

## 2023-04-11 DIAGNOSIS — M6281 Muscle weakness (generalized): Secondary | ICD-10-CM | POA: Diagnosis not present

## 2023-04-11 DIAGNOSIS — M62838 Other muscle spasm: Secondary | ICD-10-CM | POA: Diagnosis not present

## 2023-04-11 DIAGNOSIS — R279 Unspecified lack of coordination: Secondary | ICD-10-CM | POA: Diagnosis not present

## 2023-04-11 DIAGNOSIS — R293 Abnormal posture: Secondary | ICD-10-CM | POA: Diagnosis not present
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
## 2023-04-11 NOTE — Patient Instructions (Signed)
Moisturizers They are used in the vagina to hydrate the mucous membrane that make up the vaginal canal. Designed to keep a more normal acid balance (ph) Once placed in the vagina, it will last between two to three days.  Use 2-3 times per week at bedtime  Ingredients to avoid is glycerin and fragrance, can increase chance of infection Should not be used just before sex due to causing irritation Most are gels administered either in a tampon-shaped applicator or as a vaginal suppository. They are non-hormonal.   Types of Moisturizers(internal use)  Vitamin E vaginal suppositories- Whole foods, Amazon Moist Again Coconut oil- can break down condoms, any grocery store (prefer organic) Julva- (Do no use if taking  Tamoxifen) amazon Yes moisturizer- amazon NeuEve Silk , NeuEve Silver for menopausal or over 65 (if have severe vaginal atrophy or cancer treatments use NeuEve Silk for  1 month than move to [employer])- [employer], [place].com Olive and Bee intimate cream- www.oliveandbee.com.au Mae vaginal moisturizer- Amazon Aloe Good Clean Love Hyaluronic acid Hyalofemme Reveree hyaluronic acid inserts   Creams to use externally on the Vulva area Marathon Oil (good for for cancer patients that had radiation to the area)- [place] or [employer].https://garcia-[email or web address]/ Vulva Balm/ V-magic cream by medicine mama- amazon Julva-amazon Vital "V Wild Yam salve ( help moisturize and help with thinning vulvar area, does have Beeswax MoodMaid Botanical Pro-Meno Wild Yam Cream- Amazon Desert Harvest Gele Cleo by Zane Herald labial moisturizer (Amazon),  Coconut or olive oil aloe Good Clean Love Enchanted Rose by intimate rose  Things to avoid in the vaginal area Do not use things to irritate the vulvar area No lotions just specialized creams for the vulva area- Neogyn, V-magic,  No soaps; can use Aveeno or Calendula cleanser, unscented Dove if needed. Must be gentle No deodorants No douches Good to  sleep without underwear to let the vaginal area to air out No scrubbing: spread the lips to let warm water rinse over labias and pat dry   Urge Incontinence  Ideal urination frequency is every 2-4 wakeful hours, which equates to 5-8 times within a 24-hour period.   Urge incontinence is leakage that occurs when the bladder muscle contracts, creating a sudden need to go before getting to the bathroom.   Going too often when your bladder isn't actually full can disrupt the body's automatic signals to store and hold urine longer, which will increase urgency/frequency.  In this case, the bladder "is running the show" and strategies can be learned to retrain this pattern.   One should be able to control the first urge to urinate, at around .  The bladder can hold up to a "grande latte," or . To help you gain control, practice the Urge Drill below when urgency strikes.  This drill will help retrain your bladder signals and allow you to store and hold urine longer.  The overall goal is to stretch out your time between voids to reach a more manageable voiding schedule.    Practice your "quick flicks" often throughout the day (each waking hour) even when you don't need feel the urge to go.  This will help strengthen your pelvic floor muscles, making them more effective in controlling leakage.  Urge Drill  When you feel an urge to go, follow these steps to regain control: Stop what you are doing and be still Take one deep breath, directing your air into your abdomen Think an affirming thought, such as "I've got this." Do 5 quick flicks of  your pelvic floor Walk with control to the bathroom to void, or delay voiding

## 2023-04-13 ENCOUNTER — Encounter: Payer: Self-pay | Admitting: Internal Medicine

## 2023-04-13 ENCOUNTER — Ambulatory Visit: Payer: Medicare HMO | Attending: Internal Medicine | Admitting: Internal Medicine

## 2023-04-13 VITALS — BP 120/60 | HR 53 | Ht <= 58 in | Wt 109.0 lb

## 2023-04-13 DIAGNOSIS — R079 Chest pain, unspecified: Secondary | ICD-10-CM

## 2023-04-13 NOTE — Patient Instructions (Signed)
Medication Instructions:  No changes  *If you need a refill on your cardiac medications before your next appointment, please call your pharmacy*   Lab Work: None needed    Testing/Procedures: None needed    Follow-Up: At [facility], you and your health needs are our priority.  As part of our continuing mission to provide you with exceptional heart care, we have created designated Provider Care Teams.  These Care Teams include your primary Cardiologist (physician) and Advanced Practice Providers (APPs -  Physician Assistants and Nurse Practitioners) who all work together to provide you with the care you need, when you need it.   Your next appointment:   6 month(s)  Provider:   Maisie Fus, MD

## 2023-04-13 NOTE — Progress Notes (Signed)
Cardiology Office Note:    Date:  04/13/2023   ID:  Julie Wilkerson, DOB 15-May-1941, MRN [id number]  PCP:  Bradd Canary, MD   [facility] Providers Cardiologist:  Maisie Fus, MD     Referring MD: Bradd Canary, MD   No chief complaint on file. Chest pain  History of Present Illness:   Initial Visit Julie Wilkerson is a 82 y.o. female with a hx of HTN, non obstructive CAD, central sleep apnea uses CPAP, hypothyroidism,  breast cancer survivor, comes in today for chest pain in her chest and side.Her left arm gets numb. This has been going on for 1 year. Sometimes it is two days in a row, then it stops.It lasts for seconds to minutes. She rubs it and moves her arm. That improves it. He was getting speciality messages that helped with the pain. During the day she does house cleaning , walks in the neighborhood. She has back pain. She noted LH,dizzy sometimes with headaches. No dyspnea on exertion. She can walk up a flight of stairs fine. Her BP at home 120s. No orthopnea, PND, no LE edema. She cannot sleep with her CPAP.  Cardiac Hx: She underwent cath 02/09/2019 prior to hip arthroplasty procedure. She had mild-moderate non obstructive dx. No evidence of myocardial bridge. Normal filling pressures. Saw Dr. Tresa Endo for a sleep study who recommended CPAP.   Interim Hx: Her pain has not progressed. But it has not resolved. Her house cleaning is going well. No symptoms with activity  Interim hx 11/05/2021 She was seen in the ED on 11/02/2021 for chest pain. Notes substernal discomfort that radiates to her back. She was having DOE. She notes resolution of chest pain. Notes swelling in her ankles that occurs in the afternoon for which she wears compression stockings. She is struggling with her CPAP. Blood pressure is well controlled. She also reported pain in her R calf with carrying groceries as well as her thighs  Interim 02/24/2022 She recently had COVID and managed for this. Otherwise she  feels well.  Interim 04/13/2023 She notes her BP increased from 170/80 mmHg. She had a headache. Her blood pressure came down yesterday. She takes an extra lisinopril if needed. She can cough and then feels chest pain. This is chronic for her. This happens when she drinks something cold.   Current Medications: Current Outpatient Medications on File Prior to Visit  Medication Sig Dispense Refill   ASPIRIN 81 PO 81 mg daily.      ciprofloxacin (CIPRO) 500 MG tablet Take 1 tablet (500 mg total) by mouth 2 (two) times daily. 14 tablet 0   citalopram (CELEXA) 20 MG tablet Take 1 tablet (20 mg total) by mouth at bedtime. (Patient not taking: Reported on 04/13/2023) 90 tablet 0   fexofenadine (ALLEGRA ALLERGY) 180 MG tablet Take 1 tablet (180 mg total) by mouth daily as needed for allergies or rhinitis. (Patient not taking: Reported on 04/13/2023)     FLOVENT HFA 110 MCG/ACT inhaler INHALE 1 TO 2 PUFFS BY MOUTH TWICE DAILY AS NEEDED 12 g 0   furosemide (LASIX) 40 MG tablet TAKE 1 TABLET BY MOUTH EVERY OTHER DAY 45 tablet 0   hyoscyamine (LEVSIN SL) 0.125 MG SL tablet Use at least twice a day 90 tablet 1   levothyroxine (SYNTHROID) 50 MCG tablet Take 1 tablet (50 mcg total) by mouth daily before breakfast. 90 tablet 1   methylPREDNISolone (MEDROL) 4 MG tablet 3 tab po day 1,  2 tab po day 2 and 1 tab po day 3 8 tablet 0   omega-3 acid ethyl esters (LOVAZA) 1 g capsule Take 2 g by mouth daily.     omeprazole (PRILOSEC) 40 MG capsule Take 1 capsule (40 mg total) by mouth 2 (two) times daily. 60 capsule 5   Peppermint Oil (IBGARD) 90 MG CPCR Use as directed     potassium chloride (KLOR-CON M) 10 MEQ tablet Take 1 tablet by mouth once daily 90 tablet 0   sucralfate (CARAFATE) 1 g tablet Take 1 tablet by mouth twice daily 60 tablet 0   Vitamin D, Ergocalciferol, (DRISDOL) 1.25 MG (50000 UNIT) CAPS capsule Take 1 capsule (50,000 Units total) by mouth every 7 (seven) days. 5 capsule 4   No current  facility-administered medications on file prior to visit.     Allergies:   Valium [diazepam] and Albuterol and levalbuterol   Family History: The patient's family history includes Arthritis in her daughter; Colitis in her mother; Colon cancer in her paternal grandmother; Heart attack in her brother, brother, and brother; Heart disease in her daughter; Hyperlipidemia in her brother, brother, brother, and sister; Hypertension in her father; Irritable bowel syndrome in her mother; Leukemia in her daughter; Stomach cancer (age of onset: 73) in her paternal grandmother. There is no history of Esophageal cancer, Pancreatic cancer, or Rectal cancer.  ROS:   Please see the history of present illness.     All other systems reviewed and are negative.  EKGs/Labs/Other Studies Reviewed:    The following studies were reviewed today:   EKG:  EKG is  ordered today.  The ekg ordered today demonstrates   Prior EKGs Sinus bradycardia,non specific ST-T changes Sinus bradycardia    Cardiology Studies:  Coronary CTA 01/13/2022 coronary CTA with low CAC score 2.52.   TTE 01/13/21 Normal LV fxn Normal RV fxn No valve dx Normal IVC  Cath 02/09/2019 - LHC mild-moderate non obstructive dx  Cardiac Monitor: palpitations 04/03/2019 9 SVTlongest 13 beats. No atrial fibrillation, no block. No VT.  SPECT 01/18/2017 -Normal  03/29/2017 BL carotid US negative  Recent Labs: 11/09/2022: Pro B Natriuretic peptide (BNP) 92.0; TSH 1.90 04/04/2023: ALT 13; BUN 29; Creatinine, Ser 1.09; Hemoglobin 12.3; Platelets 247.0; Potassium 4.3; Sodium 137   Recent Lipid Panel    Component Value Date/Time   CHOL 261 (H) 11/09/2022 1553   TRIG 173.0 (H) 11/09/2022 1553   HDL 60.90 11/09/2022 1553   CHOLHDL 4 11/09/2022 1553   VLDL 34.6 11/09/2022 1553   LDLCALC 165 (H) 11/09/2022 1553   LDLCALC 79 02/01/2020 1020     Risk Assessment/Calculations:           Physical Exam:    VS:   Vitals:   04/13/23  1116  BP: 120/60  Pulse: (!) 53  SpO2: 95%     Wt Readings from Last 3 Encounters:  04/04/23 109 lb (49.4 kg)  03/25/23 110 lb 4 oz (50 kg)  02/25/23 113 lb 2 oz (51.3 kg)     GEN:  Well nourished, well developed in no acute distress HEENT: Normal NECK: No JVD; No carotid bruits CARDIAC: RRR, no murmurs, rubs, gallops RESPIRATORY:  Clear to auscultation without rales, wheezing or rhonchi  ABDOMEN: Soft, non-tender, non-distended MUSCULOSKELETAL:  No edema; No deformity  SKIN: Warm and dry NEUROLOGIC:  Alert and oriented x 3 PSYCHIATRIC:  Normal affect   ASSESSMENT:    #Chronic CP/DOE: had prior MSK pain. She's had prior SPECT, cath,  and Coronary CTA which did not show a cardiac cause. She is anxious and I think this contributes to her symptoms  #Leg Cramping: ABI was normal  HTN: continue lisinopril 10 mg at night, metop XL 25 mg daily.  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 Continue to encourage compression stockings.   PLAN:    In order of problems listed above:  Follow up 6 months   Medication Adjustments/Labs and Tests Ordered: Current medicines are reviewed at length with the patient today.  Concerns regarding medicines are outlined above.    Signed, Maisie Fus, MD  04/13/2023 10:57 AM

## 2023-04-20 ENCOUNTER — Ambulatory Visit: Payer: Medicare HMO | Admitting: Physical Therapy

## 2023-04-20 DIAGNOSIS — K602 Anal fissure, unspecified: Secondary | ICD-10-CM | POA: Diagnosis not present

## 2023-04-20 DIAGNOSIS — K581 Irritable bowel syndrome with constipation: Secondary | ICD-10-CM | POA: Diagnosis not present

## 2023-04-20 DIAGNOSIS — R911 Solitary pulmonary nodule: Secondary | ICD-10-CM | POA: Diagnosis not present

## 2023-04-26 ENCOUNTER — Ambulatory Visit: Payer: Medicare HMO | Admitting: Physical Therapy

## 2023-05-09 ENCOUNTER — Ambulatory Visit: Payer: Medicare HMO | Attending: Obstetrics and Gynecology | Admitting: Physical Therapy

## 2023-05-09 DIAGNOSIS — M6281 Muscle weakness (generalized): Secondary | ICD-10-CM | POA: Insufficient documentation

## 2023-05-09 DIAGNOSIS — R279 Unspecified lack of coordination: Secondary | ICD-10-CM | POA: Diagnosis not present

## 2023-05-09 DIAGNOSIS — R293 Abnormal posture: Secondary | ICD-10-CM | POA: Diagnosis not present

## 2023-05-09 NOTE — Patient Instructions (Signed)
  Complete while laying down for about 5 mins in the morning and evening.    Balloon breathing instead of pushing/straining to empty bowels. This is when you bring an open fist to your mouth and breathing out like blowing up a balloon.
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
## 2023-05-10 ENCOUNTER — Other Ambulatory Visit: Payer: Self-pay | Admitting: Medical

## 2023-05-16 ENCOUNTER — Telehealth: Payer: Self-pay | Admitting: Physical Therapy

## 2023-05-16 ENCOUNTER — Ambulatory Visit: Payer: Medicare HMO | Admitting: Physical Therapy

## 2023-05-16 NOTE — Telephone Encounter (Signed)
PT called pt about this morning's appointment at 1100. Pt did not answer, voicemail left.    Otelia Sergeant, PT, DPT 05/15/2410:21 AM

## 2023-05-25 ENCOUNTER — Other Ambulatory Visit: Payer: Self-pay | Admitting: Family Medicine

## 2023-05-25 NOTE — Patient Instructions (Addendum)
 It was good to see you again today, I hope you are feeling better soon  Please stop at lab and then x-ray on the ground floor here today for a chest film  Please go to the cardiology office at 2:20 today for evaluation with Damien- at [facility] which is your usual cardiology office location

## 2023-05-25 NOTE — Progress Notes (Addendum)
 [facility] [address] [place], [place] [address] [phone]  Date:  05/26/2023   Name:  Julie Wilkerson   DOB:  04-Jun-1940   MRN:  [id number]  PCP:  Domenica Harlene LABOR, MD    Chief Complaint: Asthma (Pt says she has been having a lot of pain in her chest. She has had a cough- worse at night and thinks this might be due to her Asthma. X 2 weeks ago. )   History of Present Illness:  Julie Wilkerson is a 83 y.o. 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  They thought it might be dust causing her asthma to be worse.  She left the warehouse and used her inhaler.  However, she notes her symptoms did not resolve right away-she developed a cough She notes she is mostly coughing at night now  She is not having CP at this moment,but notes she is still SOB off and on  History of asthma, distant history of breast cancer, history of CHF, CAD, hypertension, IBS She is a primary patient of my partner Dr. Harlene Drafts have seen her previously in April 2024-for an episode of wheezy bronchitis which we treated with doxycycline  and prednisone   She saw her gastroenterologist and her cardiologist both in November She had a cardiac cath in 2020 prior to a hip replacement, showed mild to moderate nonobstructive disease  For about a month she has noted her BP running high in the afternoon She got up to SBP 170 once  She is taking lisinopril  10 mg She is also taking furosemide  40 every other day Note bradycardia- she is not on a BB  Pulse Readings from Last 3 Encounters:  05/26/23 (!) 51  04/13/23 (!) 53  04/04/23 64   Lab Results  Component Value Date   TSH  1.90 11/09/2022   She notes history of asthma since age 83 No wheezing noted with current symptoms She has noted chills but no fever  She may cough up some mucus- a small amount  She may feel pressure in in her chest - and her left arm may go numb when she has this left against pressure.  However she as noted this for about 2 years now - not a new issue  I was able to speak with her daughter on the phone who notes SOB and CP are chronic complaints, but that her mother is complaining more than usual over the last week or so.  She also relates the story about when she was volunteering at the warehouse.  We discussed having her seen in the ER.  However, her daughter notes the symptoms are not really new.  She wonders if we could possibly get the patient a cardiology appointment today   BP Readings from Last 3 Encounters:  05/26/23 132/70  04/13/23 120/60  04/04/23 (!) 144/60     Patient Active Problem List   Diagnosis Date Noted   COVID-19 12/08/2022   Breast pain, left 07/30/2022   Diplopia 07/30/2022   Otalgia of left ear 05/05/2022   Sore throat 05/05/2022   Urge incontinence 04/28/2022   History of UTI  04/28/2022   Post-menopausal atrophic vaginitis 04/28/2022   Eye pain, left 03/25/2022   Visual changes 03/25/2022   Urinary urgency 03/25/2022   Mild intermittent asthma 08/25/2021   Pseudoaneurysm following procedure (HCC) 08/25/2021   Sun-damaged skin 07/06/2021   Sinusitis 12/31/2020   Gastroesophageal reflux disease with hiatal hernia 12/31/2020   Dark stools 09/15/2020   Back pain, chronic 06/24/2020   Sleep apnea 06/24/2020   Stomach cramps    PONV (postoperative nausea and vomiting)    Personal history of radiation therapy    Complication of anesthesia    Colitis    Malignant neoplasm of female breast (HCC)    Anxiety    Bilateral hand pain    Dysuria 02/04/2020   History of rectal bleeding 06/01/2019   LUQ pain 06/01/2019   Chronic diastolic congestive heart  failure (HCC) 05/28/2019   Paroxysmal atrial tachycardia (HCC) 05/28/2019   Coronary artery disease involving native coronary artery of native heart without angina pectoris 05/28/2019   Urinary incontinence 05/07/2019   RLS (restless legs syndrome) 05/07/2019   Hx of LASIK 04/24/2019   Lattice degeneration of right retina 04/24/2019   PVD (posterior vitreous detachment), right 04/24/2019   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   CHF (congestive heart failure) (HCC) 02/23/2019   Hyponatremia 02/16/2019   Overweight (BMI 25.0-29.9) 02/14/2019   History of repair of hip joint 02/13/2019   Accelerating angina (HCC) 02/09/2019   Preinfarction syndrome (HCC) 02/09/2019   Atypical chest pain 02/04/2019   Bursitis of left hip 09/04/2018   Pain in joint of left shoulder 08/11/2018   Degeneration of lumbar intervertebral disc 08/01/2018   Pelvic pain 07/02/2018   Left shoulder pain 05/07/2018   Left hand pain 05/07/2018   Pain of left hand 05/07/2018   Amblyopia of eye, left 04/19/2018   Cortical age-related cataract of right eye 04/18/2018   Nuclear sclerotic cataract of right eye 04/18/2018   Pseudophakia of left eye 04/18/2018   Change in bowel habits 02/22/2018   Nausea and vomiting 02/22/2018   Decrease in appetite 02/22/2018   Anemia 01/17/2018   Constipation 01/17/2018   Increased frequency of urination 12/22/2017   Right hip pain 10/23/2017   Elevated sed rate 10/23/2017   Acute pansinusitis 04/26/2017   Bronchitis 04/26/2017   Carotid bruit 03/28/2017   Nonintractable headache 03/28/2017   Amnesia 03/28/2017   Pain in pelvis 03/01/2017   Neck pain on left side 03/01/2017   Right lower quadrant pain 03/01/2017   Left leg pain 03/01/2017   Reduced visual acuity 01/16/2017   Attention deficit hyperactivity disorder, 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   Hyperglycemia 11/07/2016   Restless sleeper 11/07/2016   Abdominal pain  11/07/2016   Urinary tract infectious disease 11/04/2016   Dyspnea 11/04/2016   Change in mole 06/08/2016   Chronic thoracic back pain 05/23/2016   Vitamin D  deficiency 04/29/2016   Tremor 02/15/2016   Severe diarrhea 11/16/2015   Low back pain 08/10/2015   History of Helicobacter pylori infection 08/01/2015   Cough variant asthma  vs UACS from ACEi     Breast cancer in female [facility])    Hyperlipidemia    Essential hypertension    Osteoporosis    Hypothyroid    Irritable bowel disease    Gluten intolerance    History of chickenpox     Past Medical History:  Diagnosis Date   Abdominal pain 11/07/2016   Accelerating angina (HCC) 02/09/2019   Acute diastolic CHF (congestive  heart failure) (HCC) 02/16/2019   Acute on chronic diastolic CHF (congestive heart failure) (HCC) 02/23/2019   Acute pansinusitis 04/26/2017   Amblyopia of eye, left 04/19/2018   Anemia    Anginal pain (HCC)    Anxiety    Arthritis    Asthma    Atypical chest pain 02/04/2019   Bilateral carotid bruits 03/28/2017   Blood transfusion without reported diagnosis    Breast cancer (HCC)    2002, left, encapsulated, microcalcifications. lumpectomy, radtiation x 30   Breast cancer in female [facility])    Bursitis of left hip 09/04/2018   Change in bowel habits 02/22/2018   Change in mole 06/08/2016   Chest pain 03/04/2020   CHF (congestive heart failure) (HCC)    Chronic bilateral thoracic back pain 05/23/2016   Chronic diastolic congestive heart failure (HCC) 05/28/2019   Colitis    Complication of anesthesia    VERY SENSITIVE TO ANESTHESIA    Constipation 01/17/2018   Coronary artery disease involving native coronary artery of native heart without angina pectoris 05/28/2019   Cortical age-related cataract of right eye 04/18/2018   Cough variant asthma  vs UACS from ACEi     Spirometry 12/02/2016  FEV1 1.26 (76%)  Ratio 78 with min curvature p last saba > 6 h prior  - trial off acei 12/02/2016  - 12/02/2016   After extensive coaching HFA effectiveness =    75% with qvar  autohaler > rechallenge with 80 2bid     Decreased visual acuity 01/16/2017   Degeneration of lumbar intervertebral disc 08/01/2018   Diarrhea 11/16/2015   D/o Diverticuli, polyps, celiac disease.     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   Left hand pain 05/07/2018   Left hip pain 03/01/2017   Left leg pain 03/01/2017   Left shoulder pain 05/07/2018   Low back pain 08/10/2015   LUQ pain 06/01/2019   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   Neck pain 03/01/2017   Nuclear sclerotic cataract of right eye 04/18/2018   Osteoporosis    Overweight (BMI 25.0-29.9) 02/14/2019   Pain in joint of left shoulder 08/11/2018   Paroxysmal atrial tachycardia (HCC) 05/28/2019   Pelvic pain 07/02/2018   Personal history of radiation therapy    Pneumonia    PONV (postoperative nausea and vomiting)    Poor appetite 02/22/2018   Pseudophakia of left eye 04/18/2018   PVD (posterior vitreous detachment), right 04/24/2019   Restless sleeper 11/07/2016   Right hip pain 10/23/2017   RLQ discomfort 03/01/2017   RLS (restless legs syndrome) 05/07/2019   S/P left THA, AA 02/13/2019   Sepsis due to pneumonia (HCC) 02/16/2019   Sleep apnea    uses CPAP   Stomach cramps    Tremor 02/15/2016   Urinary frequency 12/22/2017   Urinary incontinence 05/07/2019    Urinary tract infection 11/04/2016   Vitamin D   deficiency 04/29/2016    Past Surgical History:  Procedure Laterality Date   ABDOMINAL HYSTERECTOMY     partial   APPENDECTOMY     BREAST LUMPECTOMY Left 2002   CHOLECYSTECTOMY  Age 65 or 41   COLONOSCOPY  2017   EYE SURGERY Left    cataract   LEFT HEART CATH AND CORONARY ANGIOGRAPHY N/A 02/09/2019   Procedure: LEFT HEART CATH AND CORONARY ANGIOGRAPHY;  Surgeon: Mady Bruckner, MD;  Location: [facility];  Service: Cardiovascular;  Laterality: N/A;   TONSILLECTOMY     TOTAL HIP ARTHROPLASTY Left 02/13/2019   Procedure: TOTAL HIP ARTHROPLASTY ANTERIOR APPROACH;  Surgeon: Ernie Cough, MD;  Location: [facility];  Service: Orthopedics;  Laterality: Left;  70 mins   UPPER GASTROINTESTINAL ENDOSCOPY      Social History   Tobacco Use   Smoking status: Never   Smokeless tobacco: Never  Vaping Use   Vaping status: Never Used  Substance Use Topics   Alcohol use: Never    Alcohol/week: 0.0 standard drinks of alcohol   Drug use: Never    Family History  Problem Relation Age of Onset   Colitis Mother    Irritable bowel syndrome Mother    Hypertension Father    Hyperlipidemia Sister    Hyperlipidemia Brother    Heart attack Brother    Hyperlipidemia Brother    Heart attack Brother    Hyperlipidemia Brother    Heart attack Brother    Stomach cancer Paternal Grandmother 47   Colon cancer Paternal Grandmother    Leukemia Daughter    Arthritis Daughter    Heart disease Daughter        ASD vs VSD   Esophageal cancer Neg Hx    Pancreatic cancer Neg Hx    Rectal cancer Neg Hx     Allergies  Allergen Reactions   Valium  [Diazepam ] Shortness Of Breath    Sob and bradycardia   Albuterol  And Levalbuterol  Palpitations    Episode of palpitations, tremor and anxiety when administered levalbuterol  for PFT. Recommend avoiding this medicaiton class    Medication list has been reviewed and updated.  Current Outpatient  Medications on File Prior to Visit  Medication Sig Dispense Refill   ASPIRIN  81 PO 81 mg daily.      citalopram  (CELEXA ) 20 MG tablet Take 1 tablet (20 mg total) by mouth at bedtime. 90 tablet 0   fexofenadine  (ALLEGRA  ALLERGY ) 180 MG tablet Take 1 tablet (180 mg total) by mouth daily as needed for allergies or rhinitis.     FLOVENT  HFA 110 MCG/ACT inhaler INHALE 1 TO 2 PUFFS BY MOUTH TWICE DAILY AS NEEDED 12 g 0   furosemide  (LASIX ) 40 MG tablet TAKE 1 TABLET BY MOUTH EVERY OTHER DAY 45 tablet 0   hyoscyamine  (LEVSIN  SL) 0.125 MG SL tablet Use at least twice a day 90 tablet 1   levothyroxine  (SYNTHROID ) 50 MCG tablet Take 1 tablet (50 mcg total) by mouth daily before breakfast. 90 tablet 1   omega-3 acid ethyl esters (LOVAZA ) 1 g capsule Take 2 g by mouth daily.     omeprazole  (PRILOSEC) 40 MG capsule Take 1 capsule (40 mg total) by mouth 2 (two) times daily. 60 capsule 5   Peppermint Oil (IBGARD) 90 MG CPCR Use as directed     potassium chloride  (KLOR-CON  M) 10 MEQ tablet Take 1 tablet by mouth once daily 90 tablet 0   sucralfate  (CARAFATE ) 1 g tablet Take 1 tablet by mouth  twice daily 60 tablet 0   Vitamin D , Ergocalciferol , (DRISDOL ) 1.25 MG (50000 UNIT) CAPS capsule Take 1 capsule (50,000 Units total) by mouth every 7 (seven) days. 5 capsule 4   No current facility-administered medications on file prior to visit.    Review of Systems:  As per HPI- otherwise negative.   Physical Examination: Vitals:   05/26/23 1058  BP: 132/70  Pulse: (!) 51  Resp: 18  Temp: 97.9 F (36.6 C)  SpO2: 97%   Vitals:   05/26/23 1058  Weight: 109 lb (49.4 kg)  Height: 4' 10 (1.473 m)   Body mass index is 22.78 kg/m. Ideal Body Weight: Weight in (lb) to have BMI = 25: 119.4  GEN: no acute distress.  Petite build, looks well.  Normal weight HEENT: Atraumatic, Normocephalic.  Bilateral TM wnl, oropharynx normal.  PEERL,EOMI.   Ears and Nose: No external deformity. CV: RRR, No M/G/R. No JVD.  No thrill. No extra heart sounds. PULM: CTA B, no wheezes, crackles, rhonchi. No retractions. No resp. distress. No accessory muscle use. ABD: S, NT, ND, +BS. No rebound. No HSM. EXTR: No c/c/e PSYCH: Normally interactive. Conversant.   EKG: sinus brady with rate of 49 Negative T waves V2   CXR read is not up yet- to my read lungs appear grossly normal, significant scoliosis  Assessment and Plan: Chest pain, unspecified type - Plan: EKG 12-Lead, DG Chest 2 View, Basic metabolic panel, CBC, CANCELED: CBC  SOB (shortness of breath) - Plan: DG Chest 2 View, Troponin I (High Sensitivity), D-Dimer, Quantitative  Patient seen today for follow-up.  She has complaint of shortness of breath, this is not a new issue but has been worse perhaps the last week.  She does have history of asthma.  She also notes chest pain-again, this is chronic  Will obtain a chest film today to evaluate for any sign of pneumonia.  Assuming her lungs are clear I will treat her for asthma exacerbation with prednisone   Pulmonary embolism seems less likely given bradycardia.  Will check a D-dimer.  Will also check her troponin to help rule out ACS.  Of note, patient says she is not having chest pain right now.  We were able to make her a cardiology appointment this afternoon at 220, appreciate cardiology care of this patient  Signed Harlene Schroeder, MD  Received D dimer- elevated as below Results for orders placed or performed in visit on 05/26/23  D-Dimer, Quantitative   Collection Time: 05/26/23 11:59 AM  Result Value Ref Range   D-Dimer, Quant 1.03 (H) <0.50 mcg/mL FEU   I contacted cardiology provider via chat who asked me to send pt to the ER given + D dimer.  I have called pt and let her know- she is coming back to the [facility] ER for care- rule out PE Troponin is pending

## 2023-05-26 ENCOUNTER — Encounter: Payer: Self-pay | Admitting: Internal Medicine

## 2023-05-26 ENCOUNTER — Ambulatory Visit: Payer: Medicare HMO | Admitting: Physician Assistant

## 2023-05-26 ENCOUNTER — Ambulatory Visit: Payer: Medicare HMO | Admitting: Nurse Practitioner

## 2023-05-26 ENCOUNTER — Other Ambulatory Visit: Payer: Self-pay

## 2023-05-26 ENCOUNTER — Emergency Department (HOSPITAL_BASED_OUTPATIENT_CLINIC_OR_DEPARTMENT_OTHER): Payer: Medicare HMO

## 2023-05-26 ENCOUNTER — Ambulatory Visit (INDEPENDENT_AMBULATORY_CARE_PROVIDER_SITE_OTHER): Payer: Medicare HMO | Admitting: Family Medicine

## 2023-05-26 ENCOUNTER — Encounter (HOSPITAL_BASED_OUTPATIENT_CLINIC_OR_DEPARTMENT_OTHER): Payer: Self-pay | Admitting: Urology

## 2023-05-26 ENCOUNTER — Ambulatory Visit (HOSPITAL_BASED_OUTPATIENT_CLINIC_OR_DEPARTMENT_OTHER)
Admission: RE | Admit: 2023-05-26 | Discharge: 2023-05-26 | Disposition: A | Discharge status: Home / Self Care | Payer: Medicare HMO | Source: Ambulatory Visit | Attending: Family Medicine | Admitting: Family Medicine

## 2023-05-26 ENCOUNTER — Emergency Department (HOSPITAL_BASED_OUTPATIENT_CLINIC_OR_DEPARTMENT_OTHER)
Admission: EM | Admit: 2023-05-26 | Discharge: 2023-05-26 | Disposition: A | Discharge status: Home / Self Care | Payer: Medicare HMO | Attending: Emergency Medicine | Admitting: Emergency Medicine

## 2023-05-26 VITALS — BP 132/70 | HR 51 | Temp 97.9°F | Resp 18 | Ht <= 58 in | Wt 109.0 lb

## 2023-05-26 DIAGNOSIS — Z20822 Contact with and (suspected) exposure to covid-19: Secondary | ICD-10-CM | POA: Diagnosis not present

## 2023-05-26 DIAGNOSIS — I509 Heart failure, unspecified: Secondary | ICD-10-CM | POA: Diagnosis not present

## 2023-05-26 DIAGNOSIS — I2510 Atherosclerotic heart disease of native coronary artery without angina pectoris: Secondary | ICD-10-CM | POA: Diagnosis not present

## 2023-05-26 DIAGNOSIS — Z7982 Long term (current) use of aspirin: Secondary | ICD-10-CM | POA: Diagnosis not present

## 2023-05-26 DIAGNOSIS — Z853 Personal history of malignant neoplasm of breast: Secondary | ICD-10-CM | POA: Diagnosis not present

## 2023-05-26 DIAGNOSIS — R0602 Shortness of breath: Secondary | ICD-10-CM

## 2023-05-26 DIAGNOSIS — I110 Hypertensive heart disease with heart failure: Secondary | ICD-10-CM | POA: Insufficient documentation

## 2023-05-26 DIAGNOSIS — R079 Chest pain, unspecified: Secondary | ICD-10-CM | POA: Diagnosis not present

## 2023-05-26 DIAGNOSIS — I700 Atherosclerosis of aorta: Secondary | ICD-10-CM | POA: Diagnosis not present

## 2023-05-26 DIAGNOSIS — J984 Other disorders of lung: Secondary | ICD-10-CM | POA: Diagnosis not present

## 2023-05-26 DIAGNOSIS — R059 Cough, unspecified: Secondary | ICD-10-CM | POA: Diagnosis not present

## 2023-05-26 DIAGNOSIS — R7989 Other specified abnormal findings of blood chemistry: Secondary | ICD-10-CM | POA: Diagnosis not present

## 2023-05-26 LAB — COMPREHENSIVE METABOLIC PANEL
ALT: 16 U/L (ref 0–44)
AST: 21 U/L (ref 15–41)
Albumin: 3.7 g/dL (ref 3.5–5.0)
Alkaline Phosphatase: 71 U/L (ref 38–126)
Anion gap: 7 (ref 5–15)
BUN: 28 mg/dL — ABNORMAL HIGH (ref 8–23)
CO2: 20 mmol/L — ABNORMAL LOW (ref 22–32)
Calcium: 8.3 mg/dL — ABNORMAL LOW (ref 8.9–10.3)
Chloride: 108 mmol/L (ref 98–111)
Creatinine, Ser: 0.88 mg/dL (ref 0.44–1.00)
GFR, Estimated: >60 mL/min (ref >60)
Glucose, Bld: 104 mg/dL — ABNORMAL HIGH (ref 70–99)
Potassium: 3.3 mmol/L — ABNORMAL LOW (ref 3.5–5.1)
Sodium: 135 mmol/L (ref 135–145)
Total Bilirubin: 0.6 mg/dL (ref 0.0–1.2)
Total Protein: 6.5 g/dL (ref 6.5–8.1)

## 2023-05-26 LAB — CBC WITH DIFFERENTIAL/PLATELET
Abs Immature Granulocytes: 0.02 10*3/uL (ref 0.00–0.07)
Basophils Absolute: 0 10*3/uL (ref 0.0–0.1)
Basophils Relative: 0 %
Eosinophils Absolute: 0 10*3/uL (ref 0.0–0.5)
Eosinophils Relative: 0 %
HCT: 32.7 % — ABNORMAL LOW (ref 36.0–46.0)
Hemoglobin: 10.9 g/dL — ABNORMAL LOW (ref 12.0–15.0)
Immature Granulocytes: 0 %
Lymphocytes Relative: 37 %
Lymphs Abs: 2.3 10*3/uL (ref 0.7–4.0)
MCH: 28.8 pg (ref 26.0–34.0)
MCHC: 33.3 g/dL (ref 30.0–36.0)
MCV: 86.5 fL (ref 80.0–100.0)
Monocytes Absolute: 0.6 10*3/uL (ref 0.1–1.0)
Monocytes Relative: 10 %
Neutro Abs: 3.3 10*3/uL (ref 1.7–7.7)
Neutrophils Relative %: 53 %
Platelets: 211 10*3/uL (ref 150–400)
RBC: 3.78 MIL/uL — ABNORMAL LOW (ref 3.87–5.11)
RDW: 14.3 % (ref 11.5–15.5)
WBC: 6.2 10*3/uL (ref 4.0–10.5)
nRBC: 0 % (ref 0.0–0.2)

## 2023-05-26 LAB — RESP PANEL BY RT-PCR (RSV, FLU A&B, COVID)  RVPGX2
Influenza A by PCR: NEGATIVE
Influenza B by PCR: NEGATIVE
Resp Syncytial Virus by PCR: NEGATIVE
SARS Coronavirus 2 by RT PCR: NEGATIVE

## 2023-05-26 LAB — CBC
HCT: 35.8 % (ref 35.0–45.0)
Hemoglobin: 11.6 g/dL — ABNORMAL LOW (ref 11.7–15.5)
MCH: 28.5 pg (ref 27.0–33.0)
MCHC: 32.4 g/dL (ref 32.0–36.0)
MCV: 88 fL (ref 80.0–100.0)
MPV: 11.1 fL (ref 7.5–12.5)
Platelets: 237 10*3/uL (ref 140–400)
RBC: 4.07 10*6/uL (ref 3.80–5.10)
RDW: 13.2 % (ref 11.0–15.0)
WBC: 5.7 10*3/uL (ref 3.8–10.8)

## 2023-05-26 LAB — TROPONIN I (HIGH SENSITIVITY)
High Sens Troponin I: 8 ng/L (ref 2–17)
Troponin I (High Sensitivity): 7 ng/L (ref <18)

## 2023-05-26 LAB — D-DIMER, QUANTITATIVE: D-Dimer, Quant: 1.03 ug{FEU}/mL — ABNORMAL HIGH (ref <0.50)

## 2023-05-26 LAB — BRAIN NATRIURETIC PEPTIDE: B Natriuretic Peptide: 111.6 pg/mL — ABNORMAL HIGH (ref 0.0–100.0)

## 2023-05-26 MED ORDER — IOHEXOL 350 MG/ML SOLN
75.0000 mL | Freq: Once | INTRAVENOUS | Status: AC | PRN
  Administered 2023-05-26: 75 mL via INTRAVENOUS

## 2023-05-26 NOTE — ED Triage Notes (Signed)
 Pt states was sent from PCP  States pain in chest pain on right side x 2 week  D-Dimer elevated to 1.03  Blood work done today  States intermittent SOB,

## 2023-05-26 NOTE — ED Provider Notes (Signed)
 [facility] Provider Note   CSN: [id number] Arrival date & time: 05/26/23  1401     History  Chief Complaint  Patient presents with   Chest Pain   D-Dimer 1.03    Julie Wilkerson is a 83 y.o. female.  With a history of heart failure, CAD, hypertension, hyperlipidemia and remote history of breast cancer in remission who presents to the ED for chest pain.  For the last 2 weeks she has experienced chest pain across her anterior chest.  Made worse with taking deep breath or raising her arms above her head.  Was seen by PCP earlier today for this reason and directed to the ED for further evaluation given elevated D-dimer concern for potential pulmonary embolism.  No anticoagulation.  Has had episodic shortness of breath but not at rest.  No fevers chills cough congestion nausea vomiting or abdominal pain.  No prior history of venous thromboembolism.   Chest Pain      Home Medications Prior to Admission medications   Medication Sig Start Date End Date Taking? Authorizing Provider  ASPIRIN  81 PO 81 mg daily.     [provider]  citalopram  (CELEXA ) 20 MG tablet Take 1 tablet (20 mg total) by mouth at bedtime. 03/02/23   Domenica Harlene LABOR, MD  fexofenadine  (ALLEGRA  ALLERGY ) 180 MG tablet Take 1 tablet (180 mg total) by mouth daily as needed for allergies or rhinitis. 12/15/21   Domenica Harlene LABOR, MD  FLOVENT  HFA 110 MCG/ACT inhaler INHALE 1 TO 2 PUFFS BY MOUTH TWICE DAILY AS NEEDED 06/29/22   Dewald, Jonathan B, MD  furosemide  (LASIX ) 40 MG tablet TAKE 1 TABLET BY MOUTH EVERY OTHER DAY 03/28/23   Saguier, [place], PA-C  hyoscyamine  (LEVSIN  SL) 0.125 MG SL tablet Use at least twice a day 06/30/22   Armbruster, Elspeth SQUIBB, MD  levothyroxine  (SYNTHROID ) 50 MCG tablet Take 1 tablet (50 mcg total) by mouth daily before breakfast. 02/21/23   Domenica Harlene LABOR, MD  omega-3 acid ethyl esters (LOVAZA ) 1 g capsule Take 2 g by mouth daily.    [provider]   omeprazole  (PRILOSEC) 40 MG capsule Take 1 capsule (40 mg total) by mouth 2 (two) times daily. 11/01/22   Armbruster, Elspeth SQUIBB, MD  Peppermint Oil (IBGARD) 90 MG CPCR Use as directed 06/30/22   Armbruster, Elspeth SQUIBB, MD  potassium chloride  (MICRO-K ) 10 MEQ CR capsule Take 1 capsule by mouth once daily 05/26/23   Domenica Harlene LABOR, MD  sucralfate  (CARAFATE ) 1 g tablet Take 1 tablet by mouth twice daily 05/10/23   Domenica Harlene LABOR, MD  Vitamin D , Ergocalciferol , (DRISDOL ) 1.25 MG (50000 UNIT) CAPS capsule Take 1 capsule (50,000 Units total) by mouth every 7 (seven) days. 07/27/22   Domenica Harlene LABOR, MD      Allergies    Valium  [diazepam ] and Albuterol  and levalbuterol     Review of Systems   Review of Systems  Cardiovascular:  Positive for chest pain.    Physical Exam Updated Vital Signs BP 129/70   Pulse (!) 57   Temp 98.3 F (36.8 C)   Resp 15   Ht 4' 10 (1.473 m)   Wt 49.4 kg   SpO2 96%   BMI 22.76 kg/m  Physical Exam Vitals and nursing note reviewed.  HENT:     Head: Normocephalic and atraumatic.  Eyes:     Pupils: Pupils are equal, round, and reactive to light.  Cardiovascular:  Rate and Rhythm: Normal rate and regular rhythm.  Pulmonary:     Effort: Pulmonary effort is normal.     Breath sounds: Normal breath sounds.  Abdominal:     Palpations: Abdomen is soft.     Tenderness: There is no abdominal tenderness.  Skin:    General: Skin is warm and dry.  Neurological:     Mental Status: She is alert.  Psychiatric:        Mood and Affect: Mood normal.     ED Results / Procedures / Treatments   Labs (all labs ordered are listed, but only abnormal results are displayed) Labs Reviewed  CBC WITH DIFFERENTIAL/PLATELET - Abnormal; Notable for the following components:      Result Value   RBC 3.78 (*)    Hemoglobin 10.9 (*)    HCT 32.7 (*)    All other components within normal limits  COMPREHENSIVE METABOLIC PANEL - Abnormal; Notable for the following components:    Potassium 3.3 (*)    CO2 20 (*)    Glucose, Bld 104 (*)    BUN 28 (*)    Calcium  8.3 (*)    All other components within normal limits  BRAIN NATRIURETIC PEPTIDE - Abnormal; Notable for the following components:   B Natriuretic Peptide 111.6 (*)    All other components within normal limits  RESP PANEL BY RT-PCR (RSV, FLU A&B, COVID)  [id number]  TROPONIN I (HIGH SENSITIVITY)    EKG EKG Interpretation Date/Time:  Thursday May 26 2023 14:11:06 EST Ventricular Rate:  61 PR Interval:  164 QRS Duration:  82 QT Interval:  427 QTC Calculation: 431 R Axis:   -2  Text Interpretation: Sinus rhythm Borderline T abnormalities, anterior leads Confirmed by Pamella Sharper [phone]) on 05/26/2023 4:11:40 PM  Radiology CT Angio Chest PE W and/or Wo Contrast Result Date: 05/26/2023 CLINICAL DATA:  Elevated D-dimer level, chest pain EXAM: CT ANGIOGRAPHY CHEST WITH CONTRAST TECHNIQUE: Multidetector CT imaging of the chest was performed using the standard protocol during bolus administration of intravenous contrast. Multiplanar CT image reconstructions and MIPs were obtained to evaluate the vascular anatomy. RADIATION DOSE REDUCTION: This exam was performed according to the departmental dose-optimization program which includes automated exposure control, adjustment of the mA and/or kV according to patient size and/or use of iterative reconstruction technique. CONTRAST:  75mL OMNIPAQUE  IOHEXOL  350 MG/ML SOLN COMPARISON:  Radiographs 05/26/2023 FINDINGS: Cardiovascular: No filling defect is identified in the pulmonary arterial tree to suggest pulmonary embolus. Atherosclerotic calcification of the aortic arch. Mild cardiomegaly. Mediastinum/Nodes: Moderate-sized type 1 hiatal hernia. Lungs/Pleura: Scattered mild subsegmental atelectasis or scarring favoring the lung bases. Mild nonspecific mosaic attenuation as on 11/26/2021, favoring small airways disease as worked up on prior high-resolution chest CT from  08/12/2021. Upper Abdomen: Unremarkable Musculoskeletal: Mild thoracic spondylosis. Review of the MIP images confirms the above findings. IMPRESSION: 1. No filling defect is identified in the pulmonary arterial tree to suggest pulmonary embolus. 2. Mild cardiomegaly. 3. Moderate-sized type 1 hiatal hernia. 4. Mild nonspecific mosaic attenuation favoring small airways disease as worked up on prior high-resolution chest CT from 08/12/2021. 5. Aortic atherosclerosis. Aortic Atherosclerosis (ICD10-I70.0). Electronically Signed   By: Ryan Salvage M.D.   On: 05/26/2023 19:08   DG Chest 2 View Result Date: 05/26/2023 CLINICAL DATA:  Cough, shortness of breath EXAM: CHEST - 2 VIEW COMPARISON:  11/09/2022 FINDINGS: The heart size and mediastinal contours are within normal limits. Both lungs are clear. Disc degenerative disease of the thoracic spine. IMPRESSION:  No acute abnormality of the lungs. Electronically Signed   By: Marolyn JONETTA Jaksch M.D.   On: 05/26/2023 15:07    Procedures Ultrasound ED Echo  Date/Time: 05/26/2023 4:09 PM  Performed by: Pamella Ozell LABOR, DO Authorized by: Pamella Ozell LABOR, DO   Procedure details:    Indications: chest pain     Views: subxiphoid, parasternal long axis view, parasternal short axis view, apical 4 chamber view and IVC view     Images: archived   Findings:    Pericardium: no pericardial effusion     LV Function: normal (>50% EF)     RV Diameter: normal     IVC: normal   Impression:    Impression: normal       Medications Ordered in ED Medications  iohexol  (OMNIPAQUE ) 350 MG/ML injection 75 mL (75 mLs Intravenous Contrast Given 05/26/23 1633)    ED Course/ Medical Decision Making/ A&P Clinical Course as of 05/26/23 1939  Thu May 26, 2023  1937 No evidence of PE or other acute findings on CT of the chest.  High sensitive troponin 7 not consistent with ACS.  Potassium little low at 3.3.  Laboratory workup otherwise unremarkable.  COVID and flu Enza RSV all  negative.  Patient able for discharge.  I reviewed her results with her daughter at bedside [MP]    Clinical Course User Index [MP] Pamella Ozell LABOR, DO                                 Medical Decision Making 83 year old female with history as above here for 2 weeks of chest pain.  Seen by PCP found to have elevated D-dimer greater than 1 sent here for further evaluation given concern for PE.  Benign physical exam.  Afebrile and normotensive.  Differential diagnosis includes ACS, dysrhythmia, pulmonary embolism, pneumonia, viral respiratory infection pericardial effusion.  No effusion or evidence of right heart strain or other abnormalities seen on bedside echo.  Will obtain laboratory workup including high-sensitivity troponin, CMP, BNP, CBC and continue to monitor on telemetry.  Will obtain CTA of the chest given elevated D-dimer and reported chest pain to evaluate for pulmonary embolism.  Patient hemodynamically stable at this time.  Amount and/or Complexity of Data Reviewed Labs: ordered. Radiology: ordered.  Risk Prescription drug management.           Final Clinical Impression(s) / ED Diagnoses Final diagnoses:  Nonspecific chest pain    Rx / DC Orders ED Discharge Orders     None         Pamella Ozell LABOR, DO 05/26/23 1939

## 2023-05-26 NOTE — Telephone Encounter (Signed)
Appt later today.

## 2023-05-26 NOTE — Progress Notes (Deleted)
 Office Visit    Patient Name: Julie Wilkerson Date of Encounter: 05/26/2023  Primary Care Provider:  Domenica Harlene LABOR, MD Primary Cardiologist:  Alvan Ronal BRAVO, MD  Chief Complaint   83 year old female with a history of nonobstructive CAD, hypertension, lymphedema, hypothyroidism, OSA on CPAP, and breast cancer who presents for follow-up related to CAD.  Past Medical History    Past Medical History:  Diagnosis Date   Abdominal pain 11/07/2016   Accelerating angina (HCC) 02/09/2019   Acute diastolic CHF (congestive heart failure) (HCC) 02/16/2019   Acute on chronic diastolic CHF (congestive heart failure) (HCC) 02/23/2019   Acute pansinusitis 04/26/2017   Amblyopia of eye, left 04/19/2018   Anemia    Anginal pain (HCC)    Anxiety    Arthritis    Asthma    Atypical chest pain 02/04/2019   Bilateral carotid bruits 03/28/2017   Blood transfusion without reported diagnosis    Breast cancer (HCC)    2002, left, encapsulated, microcalcifications. lumpectomy, radtiation x 30   Breast cancer in female [facility])    Bursitis of left hip 09/04/2018   Change in bowel habits 02/22/2018   Change in mole 06/08/2016   Chest pain 03/04/2020   CHF (congestive heart failure) (HCC)    Chronic bilateral thoracic back pain 05/23/2016   Chronic diastolic congestive heart failure (HCC) 05/28/2019   Colitis    Complication of anesthesia    VERY SENSITIVE TO ANESTHESIA    Constipation 01/17/2018   Coronary artery disease involving native coronary artery of native heart without angina pectoris 05/28/2019   Cortical age-related cataract of right eye 04/18/2018   Cough variant asthma  vs UACS from ACEi     Spirometry 12/02/2016  FEV1 1.26 (76%)  Ratio 78 with min curvature p last saba > 6 h prior  - trial off acei 12/02/2016  - 12/02/2016  After extensive coaching HFA effectiveness =    75% with qvar  autohaler > rechallenge with 80 2bid     Decreased visual acuity 01/16/2017   Degeneration of lumbar  intervertebral disc 08/01/2018   Diarrhea 11/16/2015   D/o Diverticuli, polyps, celiac disease.     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   Left hand pain 05/07/2018   Left hip pain 03/01/2017   Left leg pain 03/01/2017   Left shoulder pain 05/07/2018   Low back pain 08/10/2015   LUQ pain 06/01/2019   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   Neck pain 03/01/2017   Nuclear sclerotic cataract of right eye 04/18/2018   Osteoporosis    Overweight (BMI 25.0-29.9) 02/14/2019   Pain in joint of left shoulder 08/11/2018   Paroxysmal atrial tachycardia (HCC) 05/28/2019   Pelvic pain 07/02/2018   Personal history of radiation therapy    Pneumonia    PONV (postoperative nausea and vomiting)    Poor appetite 02/22/2018  Pseudophakia of left eye 04/18/2018   PVD (posterior vitreous detachment), right 04/24/2019   Restless sleeper 11/07/2016   Right hip pain 10/23/2017   RLQ discomfort 03/01/2017   RLS (restless legs syndrome) 05/07/2019   S/P left THA, AA 02/13/2019   Sepsis due to pneumonia (HCC) 02/16/2019   Sleep apnea    uses CPAP   Stomach cramps    Tremor 02/15/2016   Urinary frequency 12/22/2017   Urinary incontinence 05/07/2019   Urinary tract infection 11/04/2016   Vitamin D  deficiency 04/29/2016   Past Surgical History:  Procedure Laterality Date   ABDOMINAL HYSTERECTOMY     partial    APPENDECTOMY     BREAST LUMPECTOMY Left 2002   CHOLECYSTECTOMY  Age 29 or 41   COLONOSCOPY  2017   EYE SURGERY Left    cataract   LEFT HEART CATH AND CORONARY ANGIOGRAPHY N/A 02/09/2019   Procedure: LEFT HEART CATH AND CORONARY ANGIOGRAPHY;  Surgeon: Mady Bruckner, MD;  Location: [facility];  Service: Cardiovascular;  Laterality: N/A;   TONSILLECTOMY     TOTAL HIP ARTHROPLASTY Left 02/13/2019   Procedure: TOTAL HIP ARTHROPLASTY ANTERIOR APPROACH;  Surgeon: Ernie Cough, MD;  Location: [facility];  Service: Orthopedics;  Laterality: Left;  70 mins   UPPER GASTROINTESTINAL ENDOSCOPY      Allergies  Allergies  Allergen Reactions   Valium  [Diazepam ] Shortness Of Breath    Sob and bradycardia   Albuterol  And Levalbuterol  Palpitations    Episode of palpitations, tremor and anxiety when administered levalbuterol  for PFT. Recommend avoiding this medicaiton class     Labs/Other Studies Reviewed    The following studies were reviewed today:  Cardiac Studies & Procedures   CARDIAC CATHETERIZATION  CARDIAC CATHETERIZATION 02/09/2019  Narrative Conclusions:  Mild to moderate, non-obstructive coronary artery disease, including sequential 30% proximal and mid LAD stenoses, 50% ostial D1 lesion, and 20% ostial/proximal LMCA and mid RCA lesions.  No definite evidence of myocardial bridge on today's study.  Normal left ventricular filling pressure.  LVEF noted to be normal on recent echocardiogram.  Recommendations:  Continue medical therapy and risk factor modification.  Consider addition of statin to prevent progression of disease.  Hold aspirin  in the setting of upcoming orthopedic surgery next week.  Long-term, I recommend aspirin  81 mg daily in the setting of mild to moderate coronary artery disease.  I think it is reasonable to proceed with hip arthroplasty next week without additional cardiac testing/intervention.  Bruckner Mady, MD [facility] Pager: [phone]  Findings Coronary Findings Diagnostic  Dominance: Right  Left Main Vessel is large. Ost LM to Mid LM lesion is 20% stenosed.  Left Anterior Descending Vessel is moderate in size. Prox LAD lesion is 30% stenosed. Mid LAD lesion is 30% stenosed.  First Diagonal Branch Vessel is moderate in size. 1st Diag lesion is 50% stenosed.  Second Diagonal Branch Vessel is small in size.  Ramus Intermedius Vessel is small. Vessel is angiographically normal.  Left Circumflex Vessel is moderate in size. Vessel is angiographically normal.  First Obtuse Marginal Branch Vessel is small in size.  Second Obtuse Marginal Branch Vessel is large in size.  Third Obtuse Marginal Branch Vessel is small in size.  Right Coronary Artery Vessel is moderate in size. Mid RCA lesion is 20% stenosed.  Intervention  No interventions have been documented.   STRESS TESTS  MYOCARDIAL PERFUSION IMAGING 01/18/2017  Narrative  Nuclear stress EF: 66%. No wall motion abnormalities  There was no  ST segment deviation noted during stress.  Defect 1: There is a small defect of mild severity present in the apex location. This may very well be attenuation artifact.  This is a low risk study. No high-risk ischemia identified.  Oneil Parchment, MD  ECHOCARDIOGRAM  ECHOCARDIOGRAM COMPLETE 01/13/2021  Narrative ECHOCARDIOGRAM REPORT    Patient Name:   MADGIE DHALIWAL Date of Exam: 01/13/2021 Medical Rec #:  [id number]          Height:       58.0 in Accession #:    [id number]         Weight:       120.4 lb Date of Birth:  1940-05-30           BSA:          1.468 m Patient Age:    80 years           BP:           128/77 mmHg Patient Gender: F                  HR:           51 bpm. Exam Location:  [place]  Procedure: 2D Echo, Cardiac Doppler and Color Doppler  Indications:    Dyspnea  History:        Patient has prior history of Echocardiogram examinations. CHF, CAD and Angina,  Arrythmias:Tachycardia; Risk Factors:Hypertension, Diabetes and Dyslipidemia.  Sonographer:    EMMIE DEW RDCS Referring Phys: 97 STACEY A BLYTH  IMPRESSIONS   1. Left ventricular ejection fraction, by estimation, is 60 to 65%. The left ventricle has normal function. The left ventricle has no regional wall motion abnormalities. Left ventricular diastolic parameters were normal. 2. Right ventricular systolic function is normal. The right ventricular size is normal. 3. The mitral valve is normal in structure. Trivial mitral valve regurgitation. No evidence of mitral stenosis. 4. The aortic valve is normal in structure. Aortic valve regurgitation is mild. No aortic stenosis is present. 5. The inferior vena cava is normal in size with greater than 50% respiratory variability, suggesting right atrial pressure of 3 mmHg.  FINDINGS Left Ventricle: Left ventricular ejection fraction, by estimation, is 60 to 65%. The left ventricle has normal function. The left ventricle has no regional wall motion abnormalities. The left ventricular internal cavity size was normal in size. There is no left ventricular hypertrophy. Left ventricular diastolic parameters were normal.  Right Ventricle: The right ventricular size is normal. No increase in right ventricular wall thickness. Right ventricular systolic function is normal.  Left Atrium: Left atrial size was normal in size.  Right Atrium: Right atrial size was normal in size.  Pericardium: There is no evidence of pericardial effusion.  Mitral Valve: The mitral valve is normal in structure. Trivial mitral valve regurgitation. No evidence of mitral valve stenosis.  Tricuspid Valve: The tricuspid valve is normal in structure. Tricuspid valve regurgitation is not demonstrated. No evidence of tricuspid stenosis.  Aortic Valve: The aortic valve is normal in structure. Aortic valve regurgitation is mild. Aortic regurgitation PHT measures 1027 msec. No  aortic stenosis is present. Aortic valve mean gradient measures 4.5 mmHg. Aortic valve peak gradient measures 9.1 mmHg. Aortic valve area, by VTI measures 1.27 cm.  Pulmonic Valve: The pulmonic valve was normal in structure. Pulmonic valve regurgitation is not visualized. No evidence of pulmonic stenosis.  Aorta: The aortic root is normal in size and structure.  Venous: The inferior vena  cava is normal in size with greater than 50% respiratory variability, suggesting right atrial pressure of 3 mmHg.  IAS/Shunts: No atrial level shunt detected by color flow Doppler.   LEFT VENTRICLE PLAX 2D LVIDd:         4.38 cm     Diastology LVIDs:         2.36 cm     LV e' medial:    3.68 cm/s LV PW:         0.78 cm     LV E/e' medial:  19.6 LV IVS:        0.73 cm     LV e' lateral:   5.52 cm/s LVOT diam:     1.60 cm     LV E/e' lateral: 13.1 LV SV:         43 LV SV Index:   29 LVOT Area:     2.01 cm  LV Volumes (MOD) LV vol d, MOD A4C: 33.3 ml LV vol s, MOD A4C: 7.9 ml LV SV MOD A4C:     33.3 ml  RIGHT VENTRICLE RV Basal diam:  2.17 cm    PULMONARY VEINS RV Mid diam:    0.74 cm    A Reversal Duration: 90.00 msec RV S prime:     9.81 cm/s  A Reversal Velocity: 26.80 cm/s TAPSE (M-mode): 2.0 cm     Diastolic Velocity:  27.90 cm/s S/D Velocity:        1.40 Systolic Velocity:   38.30 cm/s  LEFT ATRIUM             Index       RIGHT ATRIUM           Index LA diam:        4.00 cm 2.73 cm/m  RA Area:     10.70 cm LA Vol (A2C):   37.6 ml 25.62 ml/m RA Volume:   20.20 ml  13.76 ml/m LA Vol (A4C):   30.0 ml 20.44 ml/m LA Biplane Vol: 33.5 ml 22.82 ml/m AORTIC VALVE                    PULMONIC VALVE AV Area (Vmax):    1.22 cm     PV Vmax:       0.84 m/s AV Area (Vmean):   1.25 cm     PV Vmean:      57.200 cm/s AV Area (VTI):     1.27 cm     PV VTI:        0.202 m AV Vmax:           151.00 cm/s  PV Peak grad:  2.9 mmHg AV Vmean:          100.100 cm/s PV Mean grad:  1.0 mmHg AV VTI:             0.336 m AV Peak Grad:      9.1 mmHg AV Mean Grad:      4.5 mmHg LVOT Vmax:         91.40 cm/s LVOT Vmean:        62.300 cm/s LVOT VTI:          0.212 m LVOT/AV VTI ratio: 0.63 AI PHT:            1027 msec  AORTA Ao Root diam: 2.70 cm Ao Asc diam:  2.70 cm  MITRAL VALVE  TRICUSPID VALVE MV Area (PHT): 4.74 cm    TR Peak grad:   28.3 mmHg MV Decel Time: 160 msec    TR Vmax:        266.00 cm/s MV E velocity: 72.20 cm/s MV A velocity: 90.80 cm/s  SHUNTS MV E/A ratio:  0.80        Systemic VTI:  0.21 m Systemic Diam: 1.60 cm  Jennifer Crape MD Electronically signed by Jennifer Crape MD Signature Date/Time: 01/13/2021/3:10:40 PM    Final   MONITORS  LONG TERM MONITOR (3-14 DAYS) 04/03/2019  Narrative The patient wore the monitor for 5 day starting 04/03/2019. Indication: Palpitations The minimum heart rate was 45 bpm, the maximum heart rate was 144 bpm, and average heart rate was 59 bpm. Predominant underlying rhythm was Sinus Rhythm. 9 Supraventricular Tachycardia runs occurred, the run with the fastest interval lasting 5 beats with a max rate of 144 bpm, the longest lasting 13 beats with an avg rate of 109 bpm. Premature atrial complexes were rare (<1.0%). Premature ventricular complexes were rare (<1.0%). No patient triggered events were noted. No Ventricular tachycardia, No pauses, No AV block and no atrial fibrillation present.  Conclusion: This study is remarkable for supraventricular tachycardia which is likely atrial tachycardia with variable block.  CT SCANS  CT CORONARY MORPH W/CTA COR W/SCORE 11/26/2021  Addendum 11/26/2021  2:08 PM ADDENDUM REPORT: 11/26/2021 14:05  ADDENDUM: OVER-READ INTERPRETATION  CT CHEST  The following report is an over-read performed by radiologist Dr. Isla Qua [facility] on 11/26/2021. This over-read does not include interpretation of cardiac or coronary anatomy or pathology. The coronary  calcium  and coronary CT angiography interpretation by the cardiologist is attached. Imaging of the chest is focused on cardiac structures and excludes much of the chest on CT.  COMPARISON:  August 12, 2021.  FINDINGS:  Cardiovascular: See dedicated report for cardiovascular details.  Mediastinum/Nodes: No acute findings or signs of adenopathy in the mediastinum. There is moderate size hiatal hernia.  Lungs/Pleura: No effusion or consolidative changes. Stable 2 mm nodule in the RIGHT middle lobe dating back to 2021, compatible with benign pulmonary nodule for which no additional imaging follow-up is recommended. Basilar scarring and atelectasis as on prior imaging.  Upper Abdomen: No acute findings.  Musculoskeletal: No acute bone finding. No destructive bone process. Spinal degenerative changes.  IMPRESSION:  Moderate size hiatal hernia. Otherwise no signs of significant or acute extracardiac findings.   Electronically Signed By: Isla Blind M.D. On: 11/26/2021 14:05  Narrative HISTORY: 83 yo female with chest pain/anginal equiv, intermediate CAD risk, not treadmill candidate  EXAM: Cardiac/Coronary CTA  TECHNIQUE: The patient was scanned on a [employer].  PROTOCOL: A 100 kV prospective scan was triggered in the descending thoracic aorta at 111 HU's. Axial non-contrast 3 mm slices were carried out through the heart. The data set was analyzed on a dedicated work station and scored using the Agatson method. Gantry rotation speed was 250 msecs and collimation was .6 mm. Beta blockade and 0.8 mg of sl NTG was given. The 3D data set was reconstructed in 5% intervals of the 35-75 % of the R-R cycle. Diastolic phases were analyzed on a dedicated work station using MPR, MIP and VRT modes. The patient received 100mL OMNIPAQUE  IOHEXOL  350 MG/ML SOLN of contrast.  FINDINGS: Quality: Good, HR 54  Coronary calcium  score: The patient's coronary artery  calcium  score is 2.52, which places the patient in the 14th percentile.  Coronary arteries:  Normal coronary origins.  Right dominance.  Right Coronary Artery: Dominant.  Normal vessel.  Left Main Coronary Artery: Normal. Bifurcates into the LAD and LCx arteries.  Left Anterior Descending Coronary Artery: Large anterior artery which reaches the apex. Punctate proximal calcification without stenosis. 2 large diagonal branches without disease.  Left Circumflex Artery: AV groove vessel which is tortuous distally. No disease.  Aorta: Normal size, 29 mm at the mid ascending aorta (level of the PA bifurcation) measured double oblique. Aortic atherosclerosis. No dissection.  Aortic Valve: Trileaflet. No calcifications.  Other findings:  Normal pulmonary vein drainage into the left atrium.  Normal left atrial appendage without a thrombus.  Normal size of the pulmonary artery.  IMPRESSION: 1. Minimal mixed CAD without stenosis, CADRADS = 0.  2. Coronary calcium  score of 2.52. This was 14th percentile for age and sex matched control.  3. Normal coronary origin with right dominance.  4. Aortic atherosclerosis.  5. Consider non-coronary causes of chest pain.  Electronically Signed: By: Vinie JAYSON Maxcy M.D. On: 11/26/2021 13:40         Recent Labs: 11/09/2022: Pro B Natriuretic peptide (BNP) 92.0; TSH 1.90 04/04/2023: ALT 13; BUN 29; Creatinine, Ser 1.09; Hemoglobin 12.3; Platelets 247.0; Potassium 4.3; Sodium 137  Recent Lipid Panel    Component Value Date/Time   CHOL 261 (H) 11/09/2022 1553   TRIG 173.0 (H) 11/09/2022 1553   HDL 60.90 11/09/2022 1553   CHOLHDL 4 11/09/2022 1553   VLDL 34.6 11/09/2022 1553   LDLCALC 165 (H) 11/09/2022 1553   LDLCALC 79 02/01/2020 1020    History of Present Illness   83 year old female with the above past medical history including nonobstructive CAD, hypertension, lymphedema, hypothyroidism, OSA on CPAP, and breast cancer.  Prior  cardiac catheterization in 2020 showed mild to moderate nonobstructive CAD, no evidence of myocardial bridge, normal filling pressures.  She saw Dr. Burnard for sleep apnea and was started on CPAP therapy.   She has a longstanding history of atypical chest pain, dyspnea on exertion.  Echocardiogram in 2022 showed EF 60 to 65%, normal LV function, no RWMA, normal RV, no significant valvular abnormalities.  ABIs in 10/2021 in the setting of leg cramping were normal.  Coronary CT angiogram in 11/2021 showed coronary calcium  score of 2.52 (14th percentile), minimal mixed CAD without stenosis.  She was last seen in office on 04/13/2023 and noted ongoing chest pain, dyspnea on exertion.  It was felt that anxiety was likely contributing to her symptoms.  She presents today for follow-up.  Since her last visit  Chronic chest pain/dyspnea on exertion/mild nonobstructive CAD: Hypertension: Chronic lymphedema: OSA: Disposition:  Home Medications    Current Outpatient Medications  Medication Sig Dispense Refill   ASPIRIN  81 PO 81 mg daily.      citalopram  (CELEXA ) 20 MG tablet Take 1 tablet (20 mg total) by mouth at bedtime. 90 tablet 0   fexofenadine  (ALLEGRA  ALLERGY ) 180 MG tablet Take 1 tablet (180 mg total) by mouth daily as needed for allergies or rhinitis.     FLOVENT  HFA 110 MCG/ACT inhaler INHALE 1 TO 2 PUFFS BY MOUTH TWICE DAILY AS NEEDED 12 g 0   furosemide  (LASIX ) 40 MG tablet TAKE 1 TABLET BY MOUTH EVERY OTHER DAY 45 tablet 0   hyoscyamine  (LEVSIN  SL) 0.125 MG SL tablet Use at least twice a day 90 tablet 1   levothyroxine  (SYNTHROID ) 50 MCG tablet Take 1 tablet (50 mcg total) by mouth daily before breakfast. 90 tablet 1  omega-3 acid ethyl esters (LOVAZA ) 1 g capsule Take 2 g by mouth daily.     omeprazole  (PRILOSEC) 40 MG capsule Take 1 capsule (40 mg total) by mouth 2 (two) times daily. 60 capsule 5   Peppermint Oil (IBGARD) 90 MG CPCR Use as directed     potassium chloride  (MICRO-K ) 10 MEQ CR  capsule Take 1 capsule by mouth once daily 90 capsule 0   sucralfate  (CARAFATE ) 1 g tablet Take 1 tablet by mouth twice daily 60 tablet 0   Vitamin D , Ergocalciferol , (DRISDOL ) 1.25 MG (50000 UNIT) CAPS capsule Take 1 capsule (50,000 Units total) by mouth every 7 (seven) days. 5 capsule 4   No current facility-administered medications for this visit.     Review of Systems    ***.  All other systems reviewed and are otherwise negative except as noted above.    Physical Exam    VS:  There were no vitals taken for this visit. , BMI There is no height or weight on file to calculate BMI.     GEN: Well nourished, well developed, in no acute distress. HEENT: normal. Neck: Supple, no JVD, carotid bruits, or masses. Cardiac: RRR, no murmurs, rubs, or gallops. No clubbing, cyanosis, edema.  Radials/DP/PT 2+ and equal bilaterally.  Respiratory:  Respirations regular and unlabored, clear to auscultation bilaterally. GI: Soft, nontender, nondistended, BS + x 4. MS: no deformity or atrophy. Skin: warm and dry, no rash. Neuro:  Strength and sensation are intact. Psych: Normal affect.  Accessory Clinical Findings    ECG personally reviewed by me today -    - no acute changes.   Lab Results  Component Value Date   WBC 6.8 04/04/2023   HGB 12.3 04/04/2023   HCT 37.3 04/04/2023   MCV 88.7 04/04/2023   PLT 247.0 04/04/2023   Lab Results  Component Value Date   CREATININE 1.09 04/04/2023   BUN 29 (H) 04/04/2023   NA 137 04/04/2023   K 4.3 04/04/2023   CL 103 04/04/2023   CO2 25 04/04/2023   Lab Results  Component Value Date   ALT 13 04/04/2023   AST 20 04/04/2023   ALKPHOS 90 04/04/2023   BILITOT 0.5 04/04/2023   Lab Results  Component Value Date   CHOL 261 (H) 11/09/2022   HDL 60.90 11/09/2022   LDLCALC 165 (H) 11/09/2022   TRIG 173.0 (H) 11/09/2022   CHOLHDL 4 11/09/2022    Lab Results  Component Value Date   HGBA1C 6.1 07/26/2022    Assessment & Plan    1.  ***       Damien JAYSON Braver, NP 05/26/2023, 12:00 PM

## 2023-05-26 NOTE — Telephone Encounter (Signed)
 Error

## 2023-05-26 NOTE — ED Notes (Signed)
 Fall risk armband Fall risk sign on door Patient wearing shoes

## 2023-05-26 NOTE — Discharge Instructions (Addendum)
 You were seen in the Emergency Department for chest pain The CAT scan taken of your chest did not show any blood clot in your lungs or other emergent findings Your blood work looked okay and your potassium was little low Your COVID influenza RSV were all negative It is important that you follow-up with your primary care doctor we will be for reevaluation Return to the Emergency Department for severe chest pain, trouble breathing or any other concerns Continue taking all previously prescribed indications

## 2023-05-27 ENCOUNTER — Telehealth: Payer: Self-pay | Admitting: Emergency Medicine

## 2023-05-27 ENCOUNTER — Telehealth: Payer: Self-pay

## 2023-05-27 LAB — BASIC METABOLIC PANEL
BUN: 27 mg/dL — ABNORMAL HIGH (ref 6–23)
CO2: 23 meq/L (ref 19–32)
Calcium: 8.9 mg/dL (ref 8.4–10.5)
Chloride: 105 meq/L (ref 96–112)
Creatinine, Ser: 1.02 mg/dL (ref 0.40–1.20)
GFR: 51.59 mL/min — ABNORMAL LOW (ref >60.00)
Glucose, Bld: 82 mg/dL (ref 70–99)
Potassium: 4 meq/L (ref 3.5–5.1)
Sodium: 138 meq/L (ref 135–145)

## 2023-05-27 NOTE — Transitions of Care (Post Inpatient/ED Visit) (Signed)
   05/27/2023  Name: Julie Wilkerson MRN: [id number] DOB: 1941-04-25  Today's TOC FU Call Status: Today's TOC FU Call Status:: Unsuccessful Call (1st Attempt) Unsuccessful Call (1st Attempt) Date: 05/27/23  Attempted to reach the patient regarding the most recent Inpatient/ED visit.  Follow Up Plan: Additional outreach attempts will be made to reach the patient to complete the Transitions of Care (Post Inpatient/ED visit) call.   Signature [name], LPN [facility] Nurse Health Advisor Direct Dial [phone]

## 2023-05-27 NOTE — Telephone Encounter (Signed)
 Copied from CRM [id number]. Topic: General - Other >> May 27, 2023  2:12 PM Suzette B wrote: Reason for CRM: Patient was returning a call she had missed from the office. I have provided the number in the summary box

## 2023-05-30 ENCOUNTER — Telehealth: Payer: Self-pay | Admitting: *Deleted

## 2023-05-30 ENCOUNTER — Other Ambulatory Visit: Payer: Self-pay

## 2023-05-30 DIAGNOSIS — R109 Unspecified abdominal pain: Secondary | ICD-10-CM

## 2023-05-30 MED ORDER — HYOSCYAMINE SULFATE 0.125 MG SL SUBL: SUBLINGUAL_TABLET | SUBLINGUAL | 2 refills | Status: AC

## 2023-05-30 NOTE — Transitions of Care (Post Inpatient/ED Visit) (Signed)
 05/30/2023  Name: Julie Wilkerson MRN: [id number] DOB: 1940-07-02  Today's TOC FU Call Status: Today's TOC FU Call Status:: Successful TOC FU Call Completed Unsuccessful Call (1st Attempt) Date: 05/27/23 [facility] FU Call Complete Date: 05/30/23 Patient's Name and Date of Birth confirmed.  Transition Care Management Follow-up Telephone Call Date of Discharge: 05/26/23 Discharge Facility: [name]) Type of Discharge: Emergency Department Reason for ED Visit: Other: (chest pain) How have you been since you were released from the hospital?: Better Any questions or concerns?: No  Items Reviewed: Did you receive and understand the discharge instructions provided?: No Medications obtained,verified, and reconciled?: Yes (Medications Reviewed) Any new allergies since your discharge?: No Dietary orders reviewed?: Yes Do you have support at home?: Yes People in Home: child(ren), adult, spouse  Medications Reviewed Today: Medications Reviewed Today     Reviewed by Emmitt Pan, LPN (Licensed Practical Nurse) on 05/30/23 at 1714  Med List Status: <None>   Medication Order Taking? Sig Documenting Provider Last Dose Status Informant  ASPIRIN  81 PO [id number] No 81 mg daily.  [provider] Taking Active   citalopram  (CELEXA ) 20 MG tablet [id number] No Take 1 tablet (20 mg total) by mouth at bedtime. Domenica Harlene LABOR, MD Taking Active   fexofenadine  (ALLEGRA  ALLERGY ) 180 MG tablet [id number] No Take 1 tablet (180 mg total) by mouth daily as needed for allergies or rhinitis. Domenica Harlene LABOR, MD Taking Active   FLOVENT  HFA 110 MCG/ACT inhaler [id number] No INHALE 1 TO 2 PUFFS BY MOUTH TWICE DAILY AS NEEDED Dewald, Jonathan B, MD Taking Active   furosemide  (LASIX ) 40 MG tablet [id number] No TAKE 1 TABLET BY MOUTH EVERY OTHER DAY Saguier, [place], PA-C Taking Active   hyoscyamine  (LEVSIN  SL) 0.125 MG SL tablet [id number]  Dissolve 1 tablet under tongue at least twice daily Armbruster, Elspeth SQUIBB,  MD  Active   levothyroxine  (SYNTHROID ) 50 MCG tablet [id number] No Take 1 tablet (50 mcg total) by mouth daily before breakfast. Domenica Harlene LABOR, MD Taking Active   omega-3 acid ethyl esters (LOVAZA ) 1 g capsule [id number] No Take 2 g by mouth daily. [provider] Taking Active Self  omeprazole  (PRILOSEC) 40 MG capsule [id number] No Take 1 capsule (40 mg total) by mouth 2 (two) times daily. Leigh Elspeth SQUIBB, MD Taking Active   Peppermint Oil (IBGARD) 90 MG CPCR [id number] No Use as directed Armbruster, Elspeth SQUIBB, MD Taking Active   potassium chloride  (MICRO-K ) 10 MEQ CR capsule [id number]  Take 1 capsule by mouth once daily Domenica Harlene LABOR, MD  Active   sucralfate  (CARAFATE ) 1 g tablet [id number] No Take 1 tablet by mouth twice daily Domenica Harlene LABOR, MD Taking Active   Vitamin D , Ergocalciferol , (DRISDOL ) 1.25 MG (50000 UNIT) CAPS capsule [id number] No Take 1 capsule (50,000 Units total) by mouth every 7 (seven) days. Domenica Harlene LABOR, MD Taking Active             Home Care and Equipment/Supplies: Were Home Health Services Ordered?: NA Any new equipment or medical supplies ordered?: NA  Functional Questionnaire: Do you need assistance with bathing/showering or dressing?: No Do you need assistance with meal preparation?: No Do you need assistance with eating?: No Do you have difficulty maintaining continence: No Do you need assistance with getting out of bed/getting out of a chair/moving?: No Do you have difficulty managing or taking your medications?: No  Follow up appointments reviewed: PCP Follow-up appointment confirmed?: No MD Provider Line Number:[phone] Given: No [facility]  Follow-up appointment confirmed?: NA Do you need transportation to your follow-up appointment?: No Do you understand care options if your condition(s) worsen?: Yes-patient verbalized understanding    SIGNATURE [name], LPN [facility] Nurse Health Advisor Direct Dial [phone]

## 2023-05-30 NOTE — Telephone Encounter (Signed)
 [facility] sent a fax for MD approval to fill for different manufacturer [employer]).    Left message to see if patient is ok with it and to call us back.

## 2023-05-31 NOTE — Telephone Encounter (Signed)
 Paper faxed back to [facility], [place]

## 2023-06-05 NOTE — Progress Notes (Signed)
 [facility] [address], [address] [place], [place] [address] [phone]  Date:  06/06/2023   Name:  Julie Wilkerson   DOB:  21-Feb-1941   MRN:  [id number]  PCP:  Domenica Harlene LABOR, MD    Chief Complaint: Follow-up (ED: 05/26/23 Chest Pain. She says her cough is worse at night. /Concerns/ questions: bump just below her abdomen- using triple antibiotic for irritation. )   History of Present Illness:  Julie Wilkerson is a 83 y.o. very pleasant female patient who presents with the following:  Patient seen today for ER follow-up-primary patient of Dr. Domenica.  I saw her on January 2 with chest pain and we plan to have her see cardiology the same day.  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  They thought it might be dust causing her asthma to be worse.  She left the warehouse and used her inhaler.  However, she notes her symptoms did not resolve right away-she developed a cough She notes she is mostly coughing at night now  She is not having CP at this moment,but notes she is still SOB off and on History of asthma, distant history of breast cancer, history of CHF, CAD, hypertension, IBS She is a primary patient of my partner Dr. Harlene Drafts have seen her previously in April 2024-for an episode of wheezy bronchitis which we treated with doxycycline  and prednisone  She saw her gastroenterologist and her cardiologist both in November She had a cardiac cath in 2020 prior to a hip replacement, showed mild to moderate nonobstructive disease For about a month she has noted her BP  running high in the afternoon  She was seen in the ER on January 2 where she had workup including troponin x 2 and CT angio chest-all negative, she was released to home  At this point she notes she is coughing a lot at night especially- she notes increased cough for 3 weeks No fever, as above recent CT angiogram did not show pneumonia but She has had asthma since her teen years- this seems like an astham exacerbation to her  She using flovent  inhaler at night  She does not tolerate albuterol  so she does not use it  She also notes a bump on her lower abdomen for a week or so- tender, she was not sure if an infection She is using hot compresses which help some as well as antibiotic ointment   Patient Active Problem List   Diagnosis Date Noted   COVID-19 12/08/2022   Breast pain, left 07/30/2022   Diplopia 07/30/2022   Otalgia of left ear 05/05/2022   Sore throat 05/05/2022   Urge incontinence 04/28/2022   History of UTI 04/28/2022   Post-menopausal atrophic vaginitis 04/28/2022   Eye pain, left 03/25/2022   Visual changes 03/25/2022   Urinary urgency 03/25/2022   Mild intermittent asthma 08/25/2021   Pseudoaneurysm following procedure (HCC) 08/25/2021   Sun-damaged skin 07/06/2021   Sinusitis 12/31/2020   Gastroesophageal reflux disease with hiatal hernia 12/31/2020   Dark stools 09/15/2020  Back pain, chronic 06/24/2020   Sleep apnea 06/24/2020   Stomach cramps    PONV (postoperative nausea and vomiting)    Personal history of radiation therapy    Complication of anesthesia    Colitis    Malignant neoplasm of female breast (HCC)    Anxiety    Bilateral hand pain    Dysuria 02/04/2020   History of rectal bleeding 06/01/2019   LUQ pain 06/01/2019   Chronic diastolic congestive heart failure (HCC) 05/28/2019   Paroxysmal atrial tachycardia (HCC) 05/28/2019   Coronary artery disease involving native coronary artery of native heart without angina pectoris 05/28/2019    Urinary incontinence 05/07/2019   RLS (restless legs syndrome) 05/07/2019   Hx of LASIK 04/24/2019   Lattice degeneration of right retina 04/24/2019   PVD (posterior vitreous detachment), right 04/24/2019   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   CHF (congestive heart failure) (HCC) 02/23/2019   Hyponatremia 02/16/2019   Overweight (BMI 25.0-29.9) 02/14/2019   History of repair of hip joint 02/13/2019   Accelerating angina (HCC) 02/09/2019   Preinfarction syndrome (HCC) 02/09/2019   Atypical chest pain 02/04/2019   Bursitis of left hip 09/04/2018   Pain in joint of left shoulder 08/11/2018   Degeneration of lumbar intervertebral disc 08/01/2018   Pelvic pain 07/02/2018   Left shoulder pain 05/07/2018   Left hand pain 05/07/2018   Pain of left hand 05/07/2018   Amblyopia of eye, left 04/19/2018   Cortical age-related cataract of right eye 04/18/2018   Nuclear sclerotic cataract of right eye 04/18/2018   Pseudophakia of left eye 04/18/2018   Change in bowel habits 02/22/2018   Nausea and vomiting 02/22/2018   Decrease in appetite 02/22/2018   Anemia 01/17/2018   Constipation 01/17/2018   Increased frequency of urination 12/22/2017   Right hip pain 10/23/2017   Elevated sed rate 10/23/2017   Acute pansinusitis 04/26/2017   Bronchitis 04/26/2017   Carotid bruit 03/28/2017   Nonintractable headache 03/28/2017   Amnesia 03/28/2017   Pain in pelvis 03/01/2017   Neck pain on left side 03/01/2017   Right lower quadrant pain 03/01/2017   Left leg pain 03/01/2017   Reduced visual acuity 01/16/2017   Attention deficit hyperactivity disorder, 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   Hyperglycemia 11/07/2016   Restless sleeper 11/07/2016   Abdominal pain 11/07/2016   Urinary tract infectious disease 11/04/2016   Dyspnea 11/04/2016   Change in mole 06/08/2016   Chronic thoracic back pain 05/23/2016   Vitamin D  deficiency 04/29/2016    Tremor 02/15/2016   Severe diarrhea 11/16/2015   Low back pain 08/10/2015   History of Helicobacter pylori infection 08/01/2015   Cough variant asthma  vs UACS from ACEi     Breast cancer in female [facility])    Hyperlipidemia    Essential hypertension    Osteoporosis    Hypothyroid    Irritable bowel disease    Gluten intolerance    History of chickenpox     Past Medical History:  Diagnosis Date   Abdominal pain 11/07/2016   Accelerating angina (HCC) 02/09/2019   Acute diastolic CHF (congestive heart failure) (HCC) 02/16/2019   Acute on chronic diastolic CHF (congestive heart failure) (HCC) 02/23/2019   Acute pansinusitis 04/26/2017   Amblyopia of eye, left 04/19/2018   Anemia    Anginal pain (HCC)    Anxiety    Arthritis    Asthma    Atypical chest pain 02/04/2019   Bilateral carotid bruits  03/28/2017   Blood transfusion without reported diagnosis    Breast cancer (HCC)    2002, left, encapsulated, microcalcifications. lumpectomy, radtiation x 30   Breast cancer in female [facility])    Bursitis of left hip 09/04/2018   Change in bowel habits 02/22/2018   Change in mole 06/08/2016   Chest pain 03/04/2020   CHF (congestive heart failure) (HCC)    Chronic bilateral thoracic back pain 05/23/2016   Chronic diastolic congestive heart failure (HCC) 05/28/2019   Colitis    Complication of anesthesia    VERY SENSITIVE TO ANESTHESIA    Constipation 01/17/2018   Coronary artery disease involving native coronary artery of native heart without angina pectoris 05/28/2019   Cortical age-related cataract of right eye 04/18/2018   Cough variant asthma  vs UACS from ACEi     Spirometry 12/02/2016  FEV1 1.26 (76%)  Ratio 78 with min curvature p last saba > 6 h prior  - trial off acei 12/02/2016  - 12/02/2016  After extensive coaching HFA effectiveness =    75% with qvar  autohaler > rechallenge with 80 2bid     Decreased visual acuity 01/16/2017   Degeneration of lumbar intervertebral disc  08/01/2018   Diarrhea 11/16/2015   D/o Diverticuli, polyps, celiac disease.     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   Left hand pain 05/07/2018   Left hip pain 03/01/2017   Left leg pain 03/01/2017   Left shoulder pain 05/07/2018   Low back pain 08/10/2015   LUQ pain 06/01/2019   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   Neck pain 03/01/2017   Nuclear sclerotic cataract of right eye 04/18/2018   Osteoporosis    Overweight (BMI 25.0-29.9) 02/14/2019   Pain in joint of left shoulder 08/11/2018   Paroxysmal atrial tachycardia (HCC) 05/28/2019   Pelvic pain 07/02/2018   Personal history of radiation therapy    Pneumonia    PONV (postoperative nausea and vomiting)    Poor appetite 02/22/2018   Pseudophakia of left eye 04/18/2018   PVD (posterior vitreous detachment), right 04/24/2019   Restless sleeper 11/07/2016   Right hip pain 10/23/2017   RLQ discomfort 03/01/2017   RLS (restless legs syndrome) 05/07/2019   S/P left THA, AA 02/13/2019   Sepsis due to pneumonia (HCC) 02/16/2019   Sleep apnea    uses CPAP   Stomach cramps    Tremor 02/15/2016   Urinary frequency 12/22/2017   Urinary incontinence 05/07/2019   Urinary tract infection 11/04/2016   Vitamin D  deficiency 04/29/2016    Past Surgical History:  Procedure Laterality Date   ABDOMINAL HYSTERECTOMY     partial   APPENDECTOMY      BREAST LUMPECTOMY Left 2002   CHOLECYSTECTOMY  Age 75 or 41   COLONOSCOPY  2017   EYE SURGERY Left    cataract   LEFT HEART CATH AND CORONARY  ANGIOGRAPHY N/A 02/09/2019   Procedure: LEFT HEART CATH AND CORONARY ANGIOGRAPHY;  Surgeon: Mady Bruckner, MD;  Location: [facility] INVASIVE CV [facility];  Service: Cardiovascular;  Laterality: N/A;   TONSILLECTOMY     TOTAL HIP ARTHROPLASTY Left 02/13/2019   Procedure: TOTAL HIP ARTHROPLASTY ANTERIOR APPROACH;  Surgeon: Ernie Cough, MD;  Location: [facility];  Service: Orthopedics;  Laterality: Left;  70 mins   UPPER GASTROINTESTINAL ENDOSCOPY      Social History   Tobacco Use   Smoking status: Never   Smokeless tobacco: Never  Vaping Use   Vaping status: Never Used  Substance Use Topics   Alcohol use: Never    Alcohol/week: 0.0 standard drinks of alcohol   Drug use: Never    Family History  Problem Relation Age of Onset   Colitis Mother    Irritable bowel syndrome Mother    Hypertension Father    Hyperlipidemia Sister    Hyperlipidemia Brother    Heart attack Brother    Hyperlipidemia Brother    Heart attack Brother    Hyperlipidemia Brother    Heart attack Brother    Stomach cancer Paternal Grandmother 38   Colon cancer Paternal Grandmother    Leukemia Daughter    Arthritis Daughter    Heart disease Daughter        ASD vs VSD   Esophageal cancer Neg Hx    Pancreatic cancer Neg Hx    Rectal cancer Neg Hx     Allergies  Allergen Reactions   Valium  [Diazepam ] Shortness Of Breath    Sob and bradycardia   Albuterol  And Levalbuterol  Palpitations    Episode of palpitations, tremor and anxiety when administered levalbuterol  for PFT. Recommend avoiding this medicaiton class    Medication list has been reviewed and updated.  Current Outpatient Medications on File Prior to Visit  Medication Sig Dispense Refill   ASPIRIN  81 PO 81 mg daily.      citalopram  (CELEXA ) 20 MG tablet Take 1 tablet (20 mg total) by mouth at bedtime. 90 tablet 0    fexofenadine  (ALLEGRA  ALLERGY ) 180 MG tablet Take 1 tablet (180 mg total) by mouth daily as needed for allergies or rhinitis.     FLOVENT  HFA 110 MCG/ACT inhaler INHALE 1 TO 2 PUFFS BY MOUTH TWICE DAILY AS NEEDED 12 g 0   furosemide  (LASIX ) 40 MG tablet TAKE 1 TABLET BY MOUTH EVERY OTHER DAY 45 tablet 0   hyoscyamine  (LEVSIN  SL) 0.125 MG SL tablet Dissolve 1 tablet under tongue at least twice daily 60 tablet 2   levothyroxine  (SYNTHROID ) 50 MCG tablet Take 1 tablet (50 mcg total) by mouth daily before breakfast. 90 tablet 1   omega-3 acid ethyl esters (LOVAZA ) 1 g capsule Take 2 g by mouth daily.     omeprazole  (PRILOSEC) 40 MG capsule Take 1 capsule (40 mg total) by mouth 2 (two) times daily. 60 capsule 5   Peppermint Oil (IBGARD) 90 MG CPCR Use as directed     potassium chloride  (MICRO-K ) 10 MEQ CR capsule Take 1 capsule by mouth once daily 90 capsule 0   sucralfate  (CARAFATE ) 1 g tablet Take 1 tablet by mouth twice daily 60 tablet 0   Vitamin D , Ergocalciferol , (DRISDOL ) 1.25 MG (50000 UNIT) CAPS capsule Take 1 capsule (50,000 Units total) by mouth every 7 (seven) days. 5 capsule 4   No current facility-administered medications on file prior to visit.    Review of Systems:  As per HPI- otherwise negative.   Physical  Examination: Vitals:   06/06/23 1328  BP: 120/72  Pulse: 63  Resp: 18  Temp: 98.3 F (36.8 C)  SpO2: 97%   Vitals:   06/06/23 1328  Weight: 112 lb (50.8 kg)  Height: 4' 10 (1.473 m)   Body mass index is 23.41 kg/m. Ideal Body Weight: Weight in (lb) to have BMI = 25: 119.4  GEN: no acute distress.  Petite build, looks well HEENT: Atraumatic, Normocephalic.  Ears and Nose: No external deformity. CV: RRR, No M/G/R. No JVD. No thrill. No extra heart sounds. PULM: CTA B, no wheezes, crackles, rhonchi. No retractions. No resp. distress. No accessory muscle use. ABD: S, NT, ND. No rebound. No HSM. EXTR: No c/c/e PSYCH: Normally interactive. Conversant.   Anterior pubic region displays a small skin lesion, this may be either a draining small abscess or a herpes type lesion.  Assessment and Plan: Skin lesion - Plan: valACYclovir  (VALTREX ) 1000 MG tablet, doxycycline  (VIBRAMYCIN ) 100 MG capsule  Mild persistent chronic asthma with acute exacerbation - Plan: doxycycline  (VIBRAMYCIN ) 100 MG capsule, predniSONE  (DELTASONE ) 20 MG tablet  Patient seen today with a skin lesion on her pubic area as well as likely asthma exacerbation  Will treat her with doxycycline  for cough x 3 weeks, this should also treat any skin or soft tissue infection.  In addition we will start her on Valtrex  in case this is a herpes virus/shingles outbreak  Prednisone  for 6 days for asthma symptoms  I have asked her to please keep me posted about  how she is doing  Signed Harlene Schroeder, MD

## 2023-06-06 ENCOUNTER — Ambulatory Visit (INDEPENDENT_AMBULATORY_CARE_PROVIDER_SITE_OTHER): Payer: Medicare HMO | Admitting: Family Medicine

## 2023-06-06 VITALS — BP 120/72 | HR 63 | Temp 98.3°F | Resp 18 | Ht <= 58 in | Wt 112.0 lb

## 2023-06-06 DIAGNOSIS — L989 Disorder of the skin and subcutaneous tissue, unspecified: Secondary | ICD-10-CM | POA: Diagnosis not present

## 2023-06-06 DIAGNOSIS — J4531 Mild persistent asthma with (acute) exacerbation: Secondary | ICD-10-CM | POA: Diagnosis not present

## 2023-06-06 MED ORDER — PREDNISONE 20 MG PO TABS: ORAL_TABLET | ORAL | 0 refills | Status: DC

## 2023-06-06 MED ORDER — DOXYCYCLINE HYCLATE 100 MG PO CAPS: 100.0000 mg | ORAL_CAPSULE | Freq: Two times a day (BID) | ORAL | 0 refills | Status: DC

## 2023-06-06 MED ORDER — VALACYCLOVIR HCL 1 G PO TABS: 1000.0000 mg | ORAL_TABLET | Freq: Two times a day (BID) | ORAL | 0 refills | Status: AC

## 2023-06-06 NOTE — Patient Instructions (Signed)
 Good to see you today- -valtrex for possible shingles -doxycycline for skin spot and your lungs -prednisone for asthma symptoms Please let me know if you are not feeling better in the next few days- Sooner if worse.

## 2023-06-13 ENCOUNTER — Other Ambulatory Visit: Payer: Self-pay | Admitting: Family Medicine

## 2023-06-14 ENCOUNTER — Ambulatory Visit (INDEPENDENT_AMBULATORY_CARE_PROVIDER_SITE_OTHER): Payer: Medicare HMO

## 2023-06-14 VITALS — Ht <= 58 in | Wt 109.0 lb

## 2023-06-14 DIAGNOSIS — Z0000 Encounter for general adult medical examination without abnormal findings: Secondary | ICD-10-CM | POA: Diagnosis not present

## 2023-06-14 NOTE — Patient Instructions (Addendum)
Ms. Coale , Thank you for taking time to come for your Medicare Wellness Visit. I appreciate your ongoing commitment to your health goals. Please review the following plan we discussed and let me know if I can assist you in the future.   Referrals/Orders/Follow-Ups/Clinician Recommendations:   This is a list of the screening recommended for you and due dates:  Health Maintenance  Topic Date Due   Zoster (Shingles) Vaccine (1 of 2) [birth year]/21/1960   Flu Shot  08/22/2023*   COVID-19 Vaccine (4 - 2024-25 season) 02/16/2024*   Medicare Annual Wellness Visit  06/13/2024   DTaP/Tdap/Td vaccine (2 - Tdap) 02/04/2026   Pneumonia Vaccine  Completed   DEXA scan (bone density measurement)  Completed   HPV Vaccine  Aged Out  *Topic was postponed. The date shown is not the original due date.    Advanced directives: (Copy Requested) Please bring a copy of your health care power of attorney and living will to the office to be added to your chart at your convenience.  Next Medicare Annual Wellness Visit scheduled for next year: Yes

## 2023-06-14 NOTE — Progress Notes (Signed)
Subjective:   Julie Wilkerson is a 83 y.o. female who presents for Medicare Annual (Subsequent) preventive examination.  Visit Complete: Virtual I connected with  Salomon Fick on 06/14/23 by a audio enabled telemedicine application and verified that I am speaking with the correct person using two identifiers.  Patient Location: Home  Provider Location: Home Office  I discussed the limitations of evaluation and management by telemedicine. The patient expressed understanding and agreed to proceed.  Vital Signs: Because this visit was a virtual/telehealth visit, some criteria may be missing or patient reported. Any vitals not documented were not able to be obtained and vitals that have been documented are patient reported.   Cardiac Risk Factors include: advanced age (>104men, >19 women);hypertension     Objective:    Today's Vitals   06/14/23 1058 06/14/23 1059  Weight: 109 lb (49.4 kg)   Height: 4\' 10"  (1.473 m)   PainSc:  0-No pain   Body mass index is 22.78 kg/m.     06/14/2023   11:07 AM 05/26/2023    2:06 PM 04/11/2023    2:13 PM 08/16/2022    8:16 PM 07/21/2022    4:43 PM 03/29/2022    1:04 PM 11/02/2021   12:40 PM  Advanced Directives  Does Patient Have a Medical Advance Directive? Yes No No Yes Yes Yes Yes  Type of Estate agent of [place];Living will   Living will Living will Living will Living will;Healthcare Power of Attorney  Does patient want to make changes to medical advance directive?    No - Patient declined  No - Patient declined   Copy of Healthcare Power of Attorney in Chart? No - copy requested        Would patient like information on creating a medical advance directive?   No - Patient declined        Current Medications (verified) Outpatient Encounter Medications as of 06/14/2023  Medication Sig   ASPIRIN 81 PO 81 mg daily.    citalopram (CELEXA) 20 MG tablet TAKE 1 TABLET BY MOUTH EVERY DAY AT BEDTIME   doxycycline (VIBRAMYCIN)  100 MG capsule Take 1 capsule (100 mg total) by mouth 2 (two) times daily. (Patient not taking: Reported on 06/14/2023)   fexofenadine (ALLEGRA ALLERGY) 180 MG tablet Take 1 tablet (180 mg total) by mouth daily as needed for allergies or rhinitis.   FLOVENT HFA 110 MCG/ACT inhaler INHALE 1 TO 2 PUFFS BY MOUTH TWICE DAILY AS NEEDED   furosemide (LASIX) 40 MG tablet TAKE 1 TABLET BY MOUTH EVERY OTHER DAY   hyoscyamine (LEVSIN SL) 0.125 MG SL tablet Dissolve 1 tablet under tongue at least twice daily   levothyroxine (SYNTHROID) 50 MCG tablet Take 1 tablet (50 mcg total) by mouth daily before breakfast.   omega-3 acid ethyl esters (LOVAZA) 1 g capsule Take 2 g by mouth daily.   omeprazole (PRILOSEC) 40 MG capsule Take 1 capsule (40 mg total) by mouth 2 (two) times daily.   Peppermint Oil (IBGARD) 90 MG CPCR Use as directed   potassium chloride (MICRO-K) 10 MEQ CR capsule Take 1 capsule by mouth once daily   predniSONE (DELTASONE) 20 MG tablet Take 40 mg by mouth daily for 3 days, then 20 mg by mouth daily for 3 days (Patient not taking: Reported on 06/14/2023)   sucralfate (CARAFATE) 1 g tablet Take 1 tablet by mouth twice daily   valACYclovir (VALTREX) 1000 MG tablet Take 1 tablet (1,000 mg total) by mouth  2 (two) times daily.   Vitamin D, Ergocalciferol, (DRISDOL) 1.25 MG (50000 UNIT) CAPS capsule Take 1 capsule (50,000 Units total) by mouth every 7 (seven) days.   No facility-administered encounter medications on file as of 06/14/2023.    Allergies (verified) Valium [diazepam] and Albuterol and levalbuterol   History: Past Medical History:  Diagnosis Date   Abdominal pain 11/07/2016   Accelerating angina (HCC) 02/09/2019   Acute diastolic CHF (congestive heart failure) (HCC) 02/16/2019   Acute on chronic diastolic CHF (congestive heart failure) (HCC) 02/23/2019   Acute pansinusitis 04/26/2017   Amblyopia of eye, left 04/19/2018   Anemia    Anginal pain (HCC)    Anxiety    Arthritis     Asthma    Atypical chest pain 02/04/2019   Bilateral carotid bruits 03/28/2017   Blood transfusion without reported diagnosis    Breast cancer (HCC)    2002, left, encapsulated, microcalcifications. lumpectomy, radtiation x 30   Breast cancer in female [facility])    Bursitis of left hip 09/04/2018   Change in bowel habits 02/22/2018   Change in mole 06/08/2016   Chest pain 03/04/2020   CHF (congestive heart failure) (HCC)    Chronic bilateral thoracic back pain 05/23/2016   Chronic diastolic congestive heart failure (HCC) 05/28/2019   Colitis    Complication of anesthesia    VERY SENSITIVE TO ANESTHESIA    Constipation 01/17/2018   Coronary artery disease involving native coronary artery of native heart without angina pectoris 05/28/2019   Cortical age-related cataract of right eye 04/18/2018   Cough variant asthma  vs UACS from ACEi     Spirometry 12/02/2016  FEV1 1.26 (76%)  Ratio 78 with min curvature p last saba > 6 h prior  - trial off acei 12/02/2016  - 12/02/2016  After extensive coaching HFA effectiveness =    75% with qvar autohaler > rechallenge with 80 2bid     Decreased visual acuity 01/16/2017   Degeneration of lumbar intervertebral disc 08/01/2018   Diarrhea 11/16/2015   D/o Diverticuli, polyps, celiac disease.     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   Left hand pain 05/07/2018   Left hip pain 03/01/2017   Left leg pain  03/01/2017   Left shoulder pain 05/07/2018   Low back pain 08/10/2015   LUQ pain 06/01/2019   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   Neck pain 03/01/2017   Nuclear sclerotic cataract of right eye 04/18/2018   Osteoporosis    Overweight (BMI 25.0-29.9) 02/14/2019   Pain in joint of left shoulder 08/11/2018   Paroxysmal atrial tachycardia (HCC) 05/28/2019   Pelvic pain 07/02/2018   Personal history of radiation therapy    Pneumonia    PONV (postoperative nausea and vomiting)    Poor appetite 02/22/2018   Pseudophakia of left eye 04/18/2018   PVD (posterior vitreous detachment), right 04/24/2019  Restless sleeper 11/07/2016   Right hip pain 10/23/2017   RLQ discomfort 03/01/2017   RLS (restless legs syndrome) 05/07/2019   S/P left THA, AA 02/13/2019   Sepsis due to pneumonia (HCC) 02/16/2019   Sleep apnea    uses CPAP   Stomach cramps    Tremor 02/15/2016   Urinary frequency 12/22/2017   Urinary incontinence 05/07/2019   Urinary tract infection 11/04/2016   Vitamin D deficiency 04/29/2016   Past Surgical History:  Procedure Laterality Date   ABDOMINAL HYSTERECTOMY     partial   APPENDECTOMY     BREAST LUMPECTOMY Left 2002   CHOLECYSTECTOMY  Age 42 or 41   COLONOSCOPY  2017   EYE SURGERY Left    cataract   LEFT HEART CATH AND CORONARY ANGIOGRAPHY N/A 02/09/2019   Procedure: LEFT HEART CATH AND CORONARY ANGIOGRAPHY;  Surgeon: Yvonne Kendall, MD;  Location: [facility];  Service: Cardiovascular;  Laterality: N/A;   TONSILLECTOMY     TOTAL HIP ARTHROPLASTY Left 02/13/2019   Procedure: TOTAL HIP ARTHROPLASTY ANTERIOR APPROACH;  Surgeon: Durene Romans, MD;  Location: [facility];  Service: Orthopedics;  Laterality: Left;  70 mins   UPPER GASTROINTESTINAL ENDOSCOPY     Family History  Problem Relation Age of Onset   Colitis Mother    Irritable bowel syndrome Mother    Hypertension Father    Hyperlipidemia Sister     Hyperlipidemia Brother    Heart attack Brother    Hyperlipidemia Brother    Heart attack Brother    Hyperlipidemia Brother    Heart attack Brother    Stomach cancer Paternal Grandmother 58   Colon cancer Paternal Grandmother    Leukemia Daughter    Arthritis Daughter    Heart disease Daughter        ASD vs VSD   Esophageal cancer Neg Hx    Pancreatic cancer Neg Hx    Rectal cancer Neg Hx    Social History   Socioeconomic History   Marital status: Single    Spouse name: Not on file   Number of children: 3   Years of education: 18   Highest education level: Bachelor's degree (e.g., BA, AB, BS)  Occupational History   Occupation: retired    Comment: Runner, broadcasting/film/video, kindergarten  Tobacco Use   Smoking status: Never   Smokeless tobacco: Never  Vaping Use   Vaping status: Never Used  Substance and Sexual Activity   Alcohol use: Never    Alcohol/week: 0.0 standard drinks of alcohol   Drug use: Never   Sexual activity: Not Currently    Birth control/protection: None    Comment: lives alone, avoids daiiry and gluten. 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  Other Topics Concern   Not on file  Social History Narrative   Not on file   Social Drivers of Health   Financial Resource Strain: Low Risk  (06/14/2023)   Overall Financial Resource Strain (CARDIA)    Difficulty of Paying Living Expenses: Not hard at all  Food Insecurity: No Food Insecurity (06/14/2023)   Hunger Vital Sign    Worried About Running Out of Food in the Last Year: Never true    Ran Out of Food in the Last Year: Never true  Transportation Needs: No Transportation Needs (06/14/2023)   PRAPARE - Administrator, Civil Service (Medical): No    Lack of Transportation (Non-Medical): No  Physical Activity: Sufficiently Active (06/14/2023)   Exercise Vital Sign    Days of Exercise per  Week: 3 days    Minutes of Exercise per Session: 60 min  Recent Concern: Physical Activity - Inactive (05/24/2023)   Exercise Vital Sign     Days of Exercise per Week: 0 days    Minutes of Exercise per Session: 20 min  Stress: No Stress Concern Present (06/14/2023)   [employer] of Occupational Health - Occupational Stress Questionnaire    Feeling of Stress : Not at all  Social Connections: Moderately Integrated (06/14/2023)   Social Connection and Isolation Panel [NHANES]    Frequency of Communication with Friends and Family: More than three times a week    Frequency of Social Gatherings with Friends and Family: More than three times a week    Attends Religious Services: More than 4 times per year    Active Member of [employer] or Organizations: Yes    Attends Engineer, structural: More than 4 times per year    Marital Status: Divorced    Tobacco Counseling Counseling given: Not Answered   Clinical Intake:  Pre-visit preparation completed: Yes  Pain : No/denies pain Pain Score: 0-No pain Faces Pain Scale: No hurt  Faces Pain Scale: No hurt  BMI - recorded: 22.78 Nutritional Status: BMI of 19-24  Normal Nutritional Risks: None Diabetes: No  How often do you need to have someone help you when you read instructions, pamphlets, or other written materials from your doctor or pharmacy?: 1 - Never  Interpreter Needed?: No  Information entered by :: Theresa Mulligan LPN   Activities of Daily Living    06/14/2023   11:06 AM  In your present state of health, do you have any difficulty performing the following activities:  Hearing? 0  Vision? 0  Difficulty concentrating or making decisions? 0  Walking or climbing stairs? 0  Dressing or bathing? 0  Doing errands, shopping? 0  Preparing Food and eating ? N  Using the Toilet? N  In the past six months, have you accidently leaked urine? Y  Comment Wears Pads and Breifs. Followed by medical attention  Do you have problems with loss of bowel control? N  Managing your Medications? N  Managing your Finances? N  Housekeeping or managing your Housekeeping?  N    Patient Care Team: Bradd Canary, MD as PCP - General (Family Medicine) Maisie Fus, MD as PCP - Cardiology (Cardiology) Erling Cruz., MD as Consulting Physician (Gastroenterology) Piedad Climes, Moshe Salisbury as Physician Assistant (Family Medicine) Day, Dannielle Karvonen, [facility] (Inactive) as Pharmacist (Pharmacist)  Indicate any recent Medical Services you may have received from other than [facility] providers in the past year (date may be approximate).     Assessment:   This is a routine wellness examination for Julie Wilkerson.  Hearing/Vision screen Hearing Screening - Comments:: Denies hearing difficulties   Vision Screening - Comments:: Wears rx glasses - up to date with routine eye exams with  Deferred   Goals Addressed               This Visit's Progress     Stay Active (pt-stated)         Depression Screen    06/14/2023   11:05 AM 02/25/2023    2:39 PM 12/08/2022    9:51 AM 07/26/2022   10:52 AM 05/05/2022   11:47 AM 03/29/2022    1:07 PM 03/25/2022   11:20 AM  PHQ 2/9 Scores  PHQ - 2 Score 0 0 0 0 0 0 0  PHQ- 9 Score  0 0  0    Fall Risk    06/14/2023   11:07 AM 02/25/2023    2:39 PM 12/08/2022    9:51 AM 07/26/2022   10:52 AM 07/26/2022   10:51 AM  Fall Risk   Falls in the past year? 0 0 0 0 0  Number falls in past yr: 0 0 0 0 0  Injury with Fall? 0 0 0 0 0  Risk for fall due to : No Fall Risks  No Fall Risks    Follow up Falls prevention discussed Falls evaluation completed Falls evaluation completed Falls evaluation completed Falls evaluation completed    MEDICARE RISK AT HOME: Medicare Risk at Home Any stairs in or around the home?: No If so, are there any without handrails?: No Home free of loose throw rugs in walkways, pet beds, electrical cords, etc?: Yes Adequate lighting in your home to reduce risk of falls?: Yes Life alert?: No Use of a cane, walker or w/c?: No Grab bars in the bathroom?: No Shower chair or bench in shower?: No Elevated toilet seat or a  handicapped toilet?: No  TIMED UP AND GO:  Was the test performed?  No    Cognitive Function:    06/27/2017    2:25 PM 04/29/2016   11:04 AM  MMSE - Mini Mental State Exam  Orientation to time 5 5  Orientation to Place 5 5  Registration 3 3  Attention/ Calculation 5 5  Recall 3 3  Language- name 2 objects 2 2  Language- repeat 1 1  Language- follow 3 step command 3 3  Language- read & follow direction 1 1  Write a sentence 1 1  Copy design 1 1  Total score 30 30      07/14/2018   11:22 AM  Montreal Cognitive Assessment   Visuospatial/ Executive (0/5) 4  Naming (0/3) 3  Attention: Read list of digits (0/2) 1  Attention: Read list of letters (0/1) 1  Attention: Serial 7 subtraction starting at 100 (0/3) 3  Language: Repeat phrase (0/2) 1  Language : Fluency (0/1) 0  Abstraction (0/2) 1  Delayed Recall (0/5) 4  Orientation (0/6) 6  Total 24  Adjusted Score (based on education) 24      06/14/2023   11:07 AM 03/29/2022    1:11 PM 12/12/2019    9:43 AM  6CIT Screen  What Year? 0 points 0 points 0 points  What month? 0 points 0 points 0 points  What time? 0 points 0 points 0 points  Count back from 20 0 points 0 points 0 points  Months in reverse 0 points 0 points 0 points  Repeat phrase 0 points 2 points 0 points  Total Score 0 points 2 points 0 points    Immunizations Immunization History  Administered Date(s) Administered   Fluad Quad(high Dose 65+) 02/01/2019, 05/29/2020, 03/17/2021, 03/25/2022   Influenza, High Dose Seasonal PF 03/11/2015, 02/05/2016, 02/05/2017, 03/23/2018   Influenza,inj,quad, With Preservative 05/23/2014, 03/24/2018   PFIZER(Purple Top)SARS-COV-2 Vaccination 06/13/2019, 07/04/2019, 04/22/2020   Pneumococcal Conjugate-13 02/01/2019   Pneumococcal Polysaccharide-23 05/23/2014   Td 02/05/2016   Zoster, Live 05/24/2009    TDAP status: Up to date  Flu Vaccine status: Declined, Education has been provided regarding the importance of this  vaccine but patient still declined. Advised may receive this vaccine at local pharmacy or Health Dept. Aware to provide a copy of the vaccination record if obtained from local pharmacy or Health Dept. Verbalized acceptance and  understanding.  Pneumococcal vaccine status: Up to date  Covid-19 vaccine status: Declined, Education has been provided regarding the importance of this vaccine but patient still declined. Advised may receive this vaccine at local pharmacy or Health Dept.or vaccine clinic. Aware to provide a copy of the vaccination record if obtained from local pharmacy or Health Dept. Verbalized acceptance and understanding.  Qualifies for Shingles Vaccine? Yes   Zostavax completed No   Shingrix Completed?: No.    Education has been provided regarding the importance of this vaccine. Patient has been advised to call insurance company to determine out of pocket expense if they have not yet received this vaccine. Advised may also receive vaccine at local pharmacy or Health Dept. Verbalized acceptance and understanding.  Screening Tests Health Maintenance  Topic Date Due   Zoster Vaccines- Shingrix (1 of 2) 11/21/1959   INFLUENZA VACCINE  08/22/2023 (Originally 12/23/2022)   COVID-19 Vaccine (4 - 2024-25 season) 02/16/2024 (Originally 01/23/2023)   Medicare Annual Wellness (AWV)  06/13/2024   DTaP/Tdap/Td (2 - Tdap) 02/04/2026   Pneumonia Vaccine 58+ Years old  Completed   DEXA SCAN  Completed   HPV VACCINES  Aged Out    Health Maintenance  Health Maintenance Due  Topic Date Due   Zoster Vaccines- Shingrix (1 of 2) 11/21/1959        Bone Density status: Completed 09/11/18. Results reflect: Bone density results: OSTEOPENIA. Repeat every   years.   Additional Screening:    Vision Screening: Recommended annual ophthalmology exams for early detection of glaucoma and other disorders of the eye. Is the patient up to date with their annual eye exam?  Yes  Who is the provider or what  is the name of the office in which the patient attends annual eye exams? Deferred If pt is not established with a provider, would they like to be referred to a provider to establish care? No .   Dental Screening: Recommended annual dental exams for proper oral hygiene   Community Resource Referral / Chronic Care Management:  CRR required this visit?  No   CCM required this visit?  No     Plan:     I have personally reviewed and noted the following in the patient's chart:   Medical and social history Use of alcohol, tobacco or illicit drugs  Current medications and supplements including opioid prescriptions. Patient is not currently taking opioid prescriptions. Functional ability and status Nutritional status Physical activity Advanced directives List of other physicians Hospitalizations, surgeries, and ER visits in previous 12 months Vitals Screenings to include cognitive, depression, and falls Referrals and appointments  In addition, I have reviewed and discussed with patient certain preventive protocols, quality metrics, and best practice recommendations. A written personalized care plan for preventive services as well as general preventive health recommendations were provided to patient.     Tillie Rung, LPN   [id number]   After Visit Summary: (MyChart) Due to this being a telephonic visit, the after visit summary with patients personalized plan was offered to patient via MyChart   Nurse Notes: None

## 2023-06-20 ENCOUNTER — Ambulatory Visit: Payer: Medicare HMO | Attending: Obstetrics and Gynecology | Admitting: Physical Therapy

## 2023-06-20 DIAGNOSIS — R279 Unspecified lack of coordination: Secondary | ICD-10-CM | POA: Diagnosis not present

## 2023-06-20 DIAGNOSIS — R293 Abnormal posture: Secondary | ICD-10-CM | POA: Diagnosis not present

## 2023-06-20 DIAGNOSIS — M6281 Muscle weakness (generalized): Secondary | ICD-10-CM | POA: Insufficient documentation
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
## 2023-06-20 NOTE — Patient Instructions (Signed)

## 2023-06-29 ENCOUNTER — Ambulatory Visit: Payer: Medicare HMO | Attending: Obstetrics and Gynecology | Admitting: Physical Therapy

## 2023-06-29 DIAGNOSIS — R279 Unspecified lack of coordination: Secondary | ICD-10-CM | POA: Insufficient documentation

## 2023-06-29 DIAGNOSIS — R293 Abnormal posture: Secondary | ICD-10-CM | POA: Insufficient documentation

## 2023-06-29 DIAGNOSIS — M6281 Muscle weakness (generalized): Secondary | ICD-10-CM | POA: Insufficient documentation

## 2023-06-29 DIAGNOSIS — M62838 Other muscle spasm: Secondary | ICD-10-CM | POA: Insufficient documentation

## 2023-06-30 ENCOUNTER — Ambulatory Visit: Payer: Medicare HMO | Admitting: Physical Therapy

## 2023-07-04 ENCOUNTER — Ambulatory Visit (INDEPENDENT_AMBULATORY_CARE_PROVIDER_SITE_OTHER): Payer: Medicare HMO | Admitting: Physician Assistant

## 2023-07-04 ENCOUNTER — Encounter: Payer: Self-pay | Admitting: Physician Assistant

## 2023-07-04 VITALS — BP 124/74 | HR 59 | Temp 97.6°F | Ht <= 58 in | Wt 111.2 lb

## 2023-07-04 DIAGNOSIS — J029 Acute pharyngitis, unspecified: Secondary | ICD-10-CM

## 2023-07-04 DIAGNOSIS — R6889 Other general symptoms and signs: Secondary | ICD-10-CM | POA: Diagnosis not present

## 2023-07-04 DIAGNOSIS — J069 Acute upper respiratory infection, unspecified: Secondary | ICD-10-CM | POA: Diagnosis not present

## 2023-07-04 LAB — POCT INFLUENZA A/B
Influenza A, POC: NEGATIVE
Influenza B, POC: NEGATIVE

## 2023-07-04 LAB — POCT RAPID STREP A (OFFICE): Rapid Strep A Screen: NEGATIVE

## 2023-07-04 NOTE — Progress Notes (Signed)
 Established patient visit   Patient: Julie Wilkerson   DOB: 11/14/1940   83 y.o. Female  MRN: [id number] Visit Date: 07/04/2023  Today's healthcare provider: Trenton Frock, PA-C   Chief Complaint  Patient presents with   flu like symptoms    Headache and sore throat-  2 days. Bodyaches and chills. Grandchildren positive for flu and strep OTC- cold and flu    Subjective     Pt reports exposure to grandchildren, one with flu and one with strep. She reports body aches, chills, headache,sore throat, for 2 days. Taking a cold and flu medicine OTC.Denies wheezing, fever.  Medications: Outpatient Medications Prior to Visit  Medication Sig   ASPIRIN  81 PO 81 mg daily.    citalopram  (CELEXA ) 20 MG tablet TAKE 1 TABLET BY MOUTH EVERY DAY AT BEDTIME   doxycycline  (VIBRAMYCIN ) 100 MG capsule Take 1 capsule (100 mg total) by mouth 2 (two) times daily.   fexofenadine  (ALLEGRA  ALLERGY ) 180 MG tablet Take 1 tablet (180 mg total) by mouth daily as needed for allergies or rhinitis.   FLOVENT  HFA 110 MCG/ACT inhaler INHALE 1 TO 2 PUFFS BY MOUTH TWICE DAILY AS NEEDED   furosemide  (LASIX ) 40 MG tablet TAKE 1 TABLET BY MOUTH EVERY OTHER DAY   hyoscyamine  (LEVSIN SL) 0.125 MG SL tablet Dissolve 1 tablet under tongue at least twice daily   levothyroxine  (SYNTHROID ) 50 MCG tablet Take 1 tablet (50 mcg total) by mouth daily before breakfast.   omega-3 acid ethyl esters (LOVAZA ) 1 g capsule Take 2 g by mouth daily.   omeprazole  (PRILOSEC) 40 MG capsule Take 1 capsule (40 mg total) by mouth 2 (two) times daily.   Peppermint Oil (IBGARD) 90 MG CPCR Use as directed   potassium chloride  (MICRO-K ) 10 MEQ CR capsule Take 1 capsule by mouth once daily   predniSONE  (DELTASONE ) 20 MG tablet Take 40 mg by mouth daily for 3 days, then 20 mg by mouth daily for 3 days   sucralfate  (CARAFATE ) 1 g tablet Take 1 tablet by mouth twice daily   valACYclovir  (VALTREX ) 1000 MG tablet Take 1 tablet (1,000 mg total) by mouth  2 (two) times daily.   Vitamin D , Ergocalciferol , (DRISDOL ) 1.25 MG (50000 UNIT) CAPS capsule Take 1 capsule (50,000 Units total) by mouth every 7 (seven) days.   No facility-administered medications prior to visit.    Review of Systems  Constitutional:  Negative for fatigue and fever.  HENT:  Positive for congestion and sore throat.   Respiratory:  Positive for cough. Negative for shortness of breath.   Cardiovascular:  Negative for chest pain and leg swelling.  Gastrointestinal:  Negative for abdominal pain.  Neurological:  Positive for headaches. Negative for dizziness.       Objective    BP 124/74   Pulse (!) 59   Temp 97.6 F (36.4 C) (Oral)   Ht 4\' 10"  (1.473 m)   Wt 111 lb 4 oz (50.5 kg)   SpO2 96%   BMI 23.25 kg/m    Physical Exam Constitutional:      General: She is awake.     Appearance: She is well-developed.  HENT:     Head: Normocephalic.     Mouth/Throat:     Pharynx: Posterior oropharyngeal erythema present. No oropharyngeal exudate.  Eyes:     Conjunctiva/sclera: Conjunctivae normal.  Cardiovascular:     Rate and Rhythm: Normal rate and regular rhythm.     Heart sounds: Normal heart  sounds.  Pulmonary:     Effort: Pulmonary effort is normal.     Breath sounds: Normal breath sounds. No wheezing, rhonchi or rales.  Skin:    General: Skin is warm.  Neurological:     Mental Status: She is alert and oriented to person, place, and time.  Psychiatric:        Attention and Perception: Attention normal.        Mood and Affect: Mood normal.        Speech: Speech normal.        Behavior: Behavior is cooperative.      Results for orders placed or performed in visit on 07/04/23  POCT rapid strep A  Result Value Ref Range   Rapid Strep A Screen Negative Negative  POCT Influenza A/B  Result Value Ref Range   Influenza A, POC Negative Negative   Influenza B, POC Negative Negative    Assessment & Plan    Upper respiratory tract infection, unspecified  type  Flu-like symptoms -     POCT Influenza A/B  Sore throat -     POCT rapid strep A   Poc flu strep negative. Advised rest, hydration, cont otc meds prn.  Any worsening symptoms to f/b with office  Return if symptoms worsen or fail to improve.       Trenton Frock, PA-C  [facility] [phone] (phone) [phone] (fax)  [facility]

## 2023-07-07 ENCOUNTER — Ambulatory Visit: Payer: Medicare HMO | Admitting: Physical Therapy

## 2023-07-07 ENCOUNTER — Encounter: Payer: Self-pay | Admitting: Physical Therapy

## 2023-07-07 DIAGNOSIS — R279 Unspecified lack of coordination: Secondary | ICD-10-CM | POA: Diagnosis not present

## 2023-07-07 DIAGNOSIS — M6281 Muscle weakness (generalized): Secondary | ICD-10-CM

## 2023-07-07 DIAGNOSIS — M62838 Other muscle spasm: Secondary | ICD-10-CM | POA: Diagnosis not present

## 2023-07-07 DIAGNOSIS — R293 Abnormal posture: Secondary | ICD-10-CM
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
## 2023-07-14 ENCOUNTER — Ambulatory Visit: Payer: Medicare HMO | Admitting: Physical Therapy

## 2023-07-19 ENCOUNTER — Other Ambulatory Visit: Payer: Self-pay | Admitting: Family Medicine

## 2023-07-19 ENCOUNTER — Other Ambulatory Visit: Payer: Self-pay | Admitting: Medical

## 2023-07-19 DIAGNOSIS — R1032 Left lower quadrant pain: Secondary | ICD-10-CM | POA: Diagnosis not present

## 2023-07-19 DIAGNOSIS — R634 Abnormal weight loss: Secondary | ICD-10-CM | POA: Diagnosis not present

## 2023-07-19 DIAGNOSIS — R112 Nausea with vomiting, unspecified: Secondary | ICD-10-CM | POA: Diagnosis not present

## 2023-07-19 DIAGNOSIS — K581 Irritable bowel syndrome with constipation: Secondary | ICD-10-CM | POA: Diagnosis not present

## 2023-07-21 ENCOUNTER — Encounter: Payer: Self-pay | Admitting: Physical Therapy

## 2023-07-21 ENCOUNTER — Ambulatory Visit: Payer: Medicare HMO | Admitting: Physical Therapy

## 2023-07-21 DIAGNOSIS — R279 Unspecified lack of coordination: Secondary | ICD-10-CM

## 2023-07-21 DIAGNOSIS — M6281 Muscle weakness (generalized): Secondary | ICD-10-CM | POA: Diagnosis not present

## 2023-07-21 DIAGNOSIS — M62838 Other muscle spasm: Secondary | ICD-10-CM | POA: Diagnosis not present

## 2023-07-21 DIAGNOSIS — R293 Abnormal posture: Secondary | ICD-10-CM
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
## 2023-07-28 ENCOUNTER — Ambulatory Visit: Payer: Medicare HMO | Attending: Obstetrics and Gynecology | Admitting: Physical Therapy

## 2023-08-02 DIAGNOSIS — Z1231 Encounter for screening mammogram for malignant neoplasm of breast: Secondary | ICD-10-CM | POA: Diagnosis not present

## 2023-08-02 DIAGNOSIS — R92333 Mammographic heterogeneous density, bilateral breasts: Secondary | ICD-10-CM | POA: Diagnosis not present

## 2023-08-02 LAB — HM MAMMOGRAPHY

## 2023-08-04 ENCOUNTER — Ambulatory Visit: Payer: Medicare HMO | Attending: Obstetrics and Gynecology | Admitting: Physical Therapy

## 2023-08-04 DIAGNOSIS — M62838 Other muscle spasm: Secondary | ICD-10-CM | POA: Diagnosis not present

## 2023-08-04 DIAGNOSIS — R293 Abnormal posture: Secondary | ICD-10-CM | POA: Insufficient documentation

## 2023-08-04 DIAGNOSIS — K5790 Diverticulosis of intestine, part unspecified, without perforation or abscess without bleeding: Secondary | ICD-10-CM | POA: Diagnosis not present

## 2023-08-04 DIAGNOSIS — M6281 Muscle weakness (generalized): Secondary | ICD-10-CM | POA: Insufficient documentation

## 2023-08-04 DIAGNOSIS — R279 Unspecified lack of coordination: Secondary | ICD-10-CM | POA: Diagnosis not present

## 2023-08-04 DIAGNOSIS — K5900 Constipation, unspecified: Secondary | ICD-10-CM | POA: Diagnosis not present
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
## 2023-08-08 ENCOUNTER — Ambulatory Visit: Payer: Medicare HMO | Admitting: Obstetrics and Gynecology

## 2023-08-08 ENCOUNTER — Encounter: Payer: Self-pay | Admitting: Obstetrics and Gynecology

## 2023-08-08 VITALS — BP 106/58 | HR 57

## 2023-08-08 DIAGNOSIS — R350 Frequency of micturition: Secondary | ICD-10-CM | POA: Diagnosis not present

## 2023-08-08 DIAGNOSIS — R159 Full incontinence of feces: Secondary | ICD-10-CM | POA: Diagnosis not present

## 2023-08-08 DIAGNOSIS — N3281 Overactive bladder: Secondary | ICD-10-CM

## 2023-08-08 MED ORDER — ESTRADIOL 0.1 MG/GM VA CREA: 0.5000 g | TOPICAL_CREAM | VAGINAL | 11 refills | Status: AC

## 2023-08-08 NOTE — Progress Notes (Signed)
 [facility] Return Visit  SUBJECTIVE  History of Present Illness: Julie Wilkerson is a 83 y.o. female seen in follow-up for OAB and fecal leakage. Plan at last visit was do pelvic floor PT, do vaginal estrogen x2 weekly, and take fiber.   Patient reports the therapy was helpful and she has not been having fecal leakage. She also reports she is going to the bathroom 5 times during the day and 2 times at night. States she is now wearing a mouth device for OSA.   Patient also reports she is having abdominal pain and some constipation.   Past Medical History: Patient  has a past medical history of Abdominal pain (11/07/2016), Accelerating angina (HCC) (02/09/2019), Acute diastolic CHF (congestive heart failure) (HCC) (02/16/2019), Acute on chronic diastolic CHF (congestive heart failure) (HCC) (02/23/2019), Acute pansinusitis (04/26/2017), Amblyopia of eye, left (04/19/2018), Anemia, Anginal pain (HCC), Anxiety, Arthritis, Asthma, Atypical chest pain (02/04/2019), Bilateral carotid bruits (03/28/2017), Blood transfusion without reported diagnosis, Breast cancer (HCC), Breast cancer in female [facility]), Bursitis of left hip (09/04/2018), Change in bowel habits (02/22/2018), Change in mole (06/08/2016), Chest pain (03/04/2020), CHF (congestive heart failure) (HCC), Chronic bilateral thoracic back pain (05/23/2016), Chronic diastolic congestive heart failure (HCC) (05/28/2019), Colitis, Complication of anesthesia, Constipation (01/17/2018), Coronary artery disease involving native coronary artery of native heart without angina pectoris (05/28/2019), Cortical age-related cataract of right eye (04/18/2018), Cough variant asthma  vs UACS from ACEi , Decreased visual acuity (01/16/2017), Degeneration of lumbar intervertebral disc (08/01/2018), Diarrhea (11/16/2015), Dyspnea (11/04/2016), Dysuria (11/04/2016), Elevated sed rate (10/23/2017), Essential hypertension, GERD (gastroesophageal reflux disease),  Gluten intolerance, History of chicken pox, History of Helicobacter pylori infection (08/01/2015), History of rectal bleeding (06/01/2019), LASIK (04/24/2019), Hyperglycemia (11/07/2016), Hyperlipidemia, Hypertension, Hyponatremia (02/16/2019), Hypothyroidism, IBS (irritable bowel syndrome), 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), Left hand pain (05/07/2018), Left hip pain (03/01/2017), Left leg pain (03/01/2017), Left shoulder pain (05/07/2018), Low back pain (08/10/2015), LUQ pain (06/01/2019), 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), 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), Neck pain (03/01/2017), Nuclear sclerotic cataract of right eye (04/18/2018), Osteoporosis, Overweight (BMI 25.0-29.9) (02/14/2019), Pain in joint of left shoulder (08/11/2018), Paroxysmal atrial tachycardia (HCC) (05/28/2019), Pelvic pain (07/02/2018), Personal history of radiation therapy, Pneumonia, PONV (postoperative nausea and vomiting), Poor appetite (02/22/2018), Pseudophakia of left eye (04/18/2018), PVD (posterior vitreous detachment), right (04/24/2019), Restless sleeper (11/07/2016), Right hip pain (10/23/2017), RLQ discomfort (03/01/2017), RLS (restless legs syndrome) (05/07/2019), S/P left THA, 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   Past Surgical History: She  has a past surgical history that includes Appendectomy; Tonsillectomy; Abdominal hysterectomy; Colonoscopy (2017); Cholecystectomy (Age 69 or 68); Eye surgery (Left); LEFT HEART CATH AND CORONARY ANGIOGRAPHY (N/A, 02/09/2019); Total hip arthroplasty (Left, 02/13/2019); Breast lumpectomy (Left, 2002); and Upper gastrointestinal endoscopy.   Medications: She has a current medication list which includes the following prescription(s): aspirin,  citalopram, doxycycline, estradiol, fexofenadine, flovent hfa, furosemide, hyoscyamine, levothyroxine, omega-3 acid ethyl esters, omeprazole, ibgard, potassium chloride, prednisone, sucralfate, valacyclovir, and vitamin d (ergocalciferol).   Allergies: Patient is allergic to valium [diazepam] and albuterol and levalbuterol.   Social History: Patient  reports that she has never smoked. She has never used smokeless tobacco. She reports that she does not drink alcohol and does not use drugs.     OBJECTIVE     Physical Exam: Vitals:   08/08/23 1503  BP: (!) 106/58  Pulse: Marland Kitchen)  57   Gen: No apparent distress, A&O x 3.  Detailed Urogynecologic Evaluation:  Deferred.    ASSESSMENT AND PLAN    Julie Wilkerson is a 83 y.o. with:  1. Urinary frequency   2. Overactive bladder   3. Incontinence of feces, unspecified fecal incontinence type    Patient's bladder symptoms have seemed to resolve for the most part with PT.  Patient to continue to use estrogen cream twice a week for vaginal atrophy.  Patient to use miralax a half dose to see if this is helpful for her constipation.   Patient to follow up PRN or sooner if needed  Selmer Dominion, NP

## 2023-08-08 NOTE — Patient Instructions (Addendum)
 Start miralax 1/2 dose daily for your bowels. It is a step in the right direction that you are not having the diarrhea and leakage.   Use estrogen x2 weekly.   Please call if things start to bother you more

## 2023-08-09 ENCOUNTER — Encounter: Payer: Self-pay | Admitting: Family Medicine

## 2023-08-18 ENCOUNTER — Emergency Department (HOSPITAL_BASED_OUTPATIENT_CLINIC_OR_DEPARTMENT_OTHER)
Admission: EM | Admit: 2023-08-18 | Discharge: 2023-08-18 | Disposition: A | Discharge status: Home / Self Care | Attending: Emergency Medicine | Admitting: Emergency Medicine

## 2023-08-18 ENCOUNTER — Other Ambulatory Visit: Payer: Self-pay

## 2023-08-18 ENCOUNTER — Emergency Department (HOSPITAL_BASED_OUTPATIENT_CLINIC_OR_DEPARTMENT_OTHER)

## 2023-08-18 DIAGNOSIS — R519 Headache, unspecified: Secondary | ICD-10-CM | POA: Diagnosis not present

## 2023-08-18 DIAGNOSIS — R109 Unspecified abdominal pain: Secondary | ICD-10-CM | POA: Diagnosis not present

## 2023-08-18 DIAGNOSIS — I110 Hypertensive heart disease with heart failure: Secondary | ICD-10-CM | POA: Diagnosis not present

## 2023-08-18 DIAGNOSIS — Z79899 Other long term (current) drug therapy: Secondary | ICD-10-CM | POA: Diagnosis not present

## 2023-08-18 DIAGNOSIS — Z7982 Long term (current) use of aspirin: Secondary | ICD-10-CM | POA: Diagnosis not present

## 2023-08-18 DIAGNOSIS — I509 Heart failure, unspecified: Secondary | ICD-10-CM | POA: Insufficient documentation

## 2023-08-18 DIAGNOSIS — Z122 Encounter for screening for malignant neoplasm of respiratory organs: Secondary | ICD-10-CM | POA: Diagnosis not present

## 2023-08-18 DIAGNOSIS — R112 Nausea with vomiting, unspecified: Secondary | ICD-10-CM | POA: Insufficient documentation

## 2023-08-18 DIAGNOSIS — I700 Atherosclerosis of aorta: Secondary | ICD-10-CM | POA: Diagnosis not present

## 2023-08-18 DIAGNOSIS — R1110 Vomiting, unspecified: Secondary | ICD-10-CM

## 2023-08-18 DIAGNOSIS — I6782 Cerebral ischemia: Secondary | ICD-10-CM | POA: Diagnosis not present

## 2023-08-18 DIAGNOSIS — R1013 Epigastric pain: Secondary | ICD-10-CM | POA: Diagnosis not present

## 2023-08-18 DIAGNOSIS — R42 Dizziness and giddiness: Secondary | ICD-10-CM | POA: Diagnosis not present

## 2023-08-18 DIAGNOSIS — R0789 Other chest pain: Secondary | ICD-10-CM | POA: Insufficient documentation

## 2023-08-18 DIAGNOSIS — R079 Chest pain, unspecified: Secondary | ICD-10-CM | POA: Diagnosis not present

## 2023-08-18 DIAGNOSIS — K5730 Diverticulosis of large intestine without perforation or abscess without bleeding: Secondary | ICD-10-CM | POA: Diagnosis not present

## 2023-08-18 LAB — COMPREHENSIVE METABOLIC PANEL WITH GFR
ALT: 16 U/L (ref 0–44)
AST: 22 U/L (ref 15–41)
Albumin: 3.8 g/dL (ref 3.5–5.0)
Alkaline Phosphatase: 65 U/L (ref 38–126)
Anion gap: 14 (ref 5–15)
BUN: 54 mg/dL — ABNORMAL HIGH (ref 8–23)
CO2: 22 mmol/L (ref 22–32)
Calcium: 8.7 mg/dL — ABNORMAL LOW (ref 8.9–10.3)
Chloride: 102 mmol/L (ref 98–111)
Creatinine, Ser: 1.48 mg/dL — ABNORMAL HIGH (ref 0.44–1.00)
GFR, Estimated: 35 mL/min — ABNORMAL LOW (ref >60)
Glucose, Bld: 98 mg/dL (ref 70–99)
Potassium: 4.6 mmol/L (ref 3.5–5.1)
Sodium: 138 mmol/L (ref 135–145)
Total Bilirubin: 0.6 mg/dL (ref 0.0–1.2)
Total Protein: 7 g/dL (ref 6.5–8.1)

## 2023-08-18 LAB — CBC WITH DIFFERENTIAL/PLATELET
Abs Immature Granulocytes: 0.02 10*3/uL (ref 0.00–0.07)
Basophils Absolute: 0 10*3/uL (ref 0.0–0.1)
Basophils Relative: 0 %
Eosinophils Absolute: 0 10*3/uL (ref 0.0–0.5)
Eosinophils Relative: 0 %
HCT: 30.7 % — ABNORMAL LOW (ref 36.0–46.0)
Hemoglobin: 10.1 g/dL — ABNORMAL LOW (ref 12.0–15.0)
Immature Granulocytes: 0 %
Lymphocytes Relative: 42 %
Lymphs Abs: 2.5 10*3/uL (ref 0.7–4.0)
MCH: 29.1 pg (ref 26.0–34.0)
MCHC: 32.9 g/dL (ref 30.0–36.0)
MCV: 88.5 fL (ref 80.0–100.0)
Monocytes Absolute: 0.7 10*3/uL (ref 0.1–1.0)
Monocytes Relative: 12 %
Neutro Abs: 2.7 10*3/uL (ref 1.7–7.7)
Neutrophils Relative %: 46 %
Platelets: 195 10*3/uL (ref 150–400)
RBC: 3.47 MIL/uL — ABNORMAL LOW (ref 3.87–5.11)
RDW: 14.9 % (ref 11.5–15.5)
WBC: 6 10*3/uL (ref 4.0–10.5)
nRBC: 0 % (ref 0.0–0.2)

## 2023-08-18 LAB — TROPONIN I (HIGH SENSITIVITY)
Troponin I (High Sensitivity): 7 ng/L (ref <18)
Troponin I (High Sensitivity): 7 ng/L (ref <18)

## 2023-08-18 LAB — BRAIN NATRIURETIC PEPTIDE: B Natriuretic Peptide: 120.5 pg/mL — ABNORMAL HIGH (ref 0.0–100.0)

## 2023-08-18 LAB — LIPASE, BLOOD: Lipase: 38 U/L (ref 11–51)

## 2023-08-18 MED ORDER — IOHEXOL 350 MG/ML SOLN
80.0000 mL | Freq: Once | INTRAVENOUS | Status: AC | PRN
  Administered 2023-08-18: 80 mL via INTRAVENOUS

## 2023-08-18 MED ORDER — ONDANSETRON HCL 4 MG/2ML IJ SOLN
4.0000 mg | Freq: Once | INTRAMUSCULAR | Status: AC
  Administered 2023-08-18: 4 mg via INTRAVENOUS
  Filled 2023-08-18: qty 2

## 2023-08-18 MED ORDER — ALUM & MAG HYDROXIDE-SIMETH 200-200-20 MG/5ML PO SUSP
30.0000 mL | Freq: Once | ORAL | Status: AC
  Administered 2023-08-18: 30 mL via ORAL
  Filled 2023-08-18: qty 30

## 2023-08-18 MED ORDER — ONDANSETRON 4 MG PO TBDP
4.0000 mg | ORAL_TABLET | Freq: Once | ORAL | Status: AC
  Administered 2023-08-18: 4 mg via ORAL
  Filled 2023-08-18: qty 1

## 2023-08-18 MED ORDER — ACETAMINOPHEN 325 MG PO TABS
650.0000 mg | ORAL_TABLET | Freq: Once | ORAL | Status: AC
  Administered 2023-08-18: 650 mg via ORAL
  Filled 2023-08-18: qty 2

## 2023-08-18 MED ORDER — ONDANSETRON 4 MG PO TBDP: 4.0000 mg | ORAL_TABLET | Freq: Three times a day (TID) | ORAL | 0 refills | Status: DC | PRN

## 2023-08-18 MED ORDER — SODIUM CHLORIDE 0.9 % IV BOLUS
1000.0000 mL | Freq: Once | INTRAVENOUS | Status: AC
  Administered 2023-08-18: 1000 mL via INTRAVENOUS

## 2023-08-18 NOTE — ED Notes (Signed)
 Patient transported to CT

## 2023-08-18 NOTE — ED Triage Notes (Signed)
 Pt POV in wheelchair with daughter- pt c/o R chest pain for "few days", also reports dizziness (feeling faint), nausea while at the gym appx 1400-1500 today.  Emesis several times. Reports emesis was brown.   Reports intermittent ShOB, denies at this time. DOE. Denies swelling in legs or abd.

## 2023-08-18 NOTE — ED Provider Notes (Signed)
 [place] [facility] AT [facility] Provider Note   CSN: [id number] Arrival date & time: 08/18/23  1749     History  Chief Complaint  Patient presents with   Chest Pain   Emesis    Julie Wilkerson is a 83 y.o. female.  Patient here with abdominal pain chest pain dizziness emesis.  She has a history of IBS colitis hypertension high cholesterol.  She was at the gym on the bike when her belly started to hurt got nauseous and threw up felt dizzy.  Was able to drive her self home.  She was having upper abdominal pain radiating up into her chest.  Dizziness has resolved.  Nausea is improved.  History of IBS chronic back pain paroxysmal tachycardia.  She denies any weakness numbness tingling.  Denies any diarrhea.  Denies any fevers or chills.  Does have history of heart failure.  The history is provided by the patient.       Home Medications Prior to Admission medications   Medication Sig Start Date End Date Taking? Authorizing Provider  ondansetron (ZOFRAN-ODT) 4 MG disintegrating tablet Take 1 tablet (4 mg total) by mouth every 8 (eight) hours as needed. 08/18/23  Yes Jarmal Lewelling, DO  ASPIRIN 81 PO 81 mg daily.     [provider]  citalopram (CELEXA) 20 MG tablet TAKE 1 TABLET BY MOUTH EVERY DAY AT BEDTIME 06/14/23   Bradd Canary, MD  doxycycline (VIBRAMYCIN) 100 MG capsule Take 1 capsule (100 mg total) by mouth 2 (two) times daily. 06/06/23   Copland, Gwenlyn Found, MD  estradiol (ESTRACE) 0.1 MG/GM vaginal cream Place 0.5 g vaginally 2 (two) times a week. Place 0.5g nightly for two weeks then twice a week after 08/08/23   Selmer Dominion, NP  fexofenadine [facility] ALLERGY) 180 MG tablet Take 1 tablet (180 mg total) by mouth daily as needed for allergies or rhinitis. 12/15/21   Bradd Canary, MD  FLOVENT HFA 110 MCG/ACT inhaler INHALE 1 TO 2 PUFFS BY MOUTH TWICE DAILY AS NEEDED 06/29/22   Martina Sinner, MD  furosemide (LASIX) 40 MG tablet TAKE 1  TABLET BY MOUTH EVERY OTHER DAY 07/20/23   Saguier, Ramon Dredge, PA-C  hyoscyamine (LEVSIN SL) 0.125 MG SL tablet Dissolve 1 tablet under tongue at least twice daily 05/30/23   Armbruster, Willaim Rayas, MD  levothyroxine (SYNTHROID) 50 MCG tablet Take 1 tablet (50 mcg total) by mouth daily before breakfast. 02/21/23   Bradd Canary, MD  omega-3 acid ethyl esters (LOVAZA) 1 g capsule Take 2 g by mouth daily.    [provider]  omeprazole (PRILOSEC) 40 MG capsule Take 1 capsule (40 mg total) by mouth 2 (two) times daily. 11/01/22   Armbruster, Willaim Rayas, MD  Peppermint Oil (IBGARD) 90 MG CPCR Use as directed 06/30/22   Armbruster, Willaim Rayas, MD  potassium chloride (MICRO-K) 10 MEQ CR capsule Take 1 capsule by mouth once daily 05/26/23   Bradd Canary, MD  predniSONE (DELTASONE) 20 MG tablet Take 40 mg by mouth daily for 3 days, then 20 mg by mouth daily for 3 days 06/06/23   Copland, Gwenlyn Found, MD  sucralfate (CARAFATE) 1 g tablet Take 1 tablet by mouth twice daily 07/20/23   Bradd Canary, MD  valACYclovir (VALTREX) 1000 MG tablet Take 1 tablet (1,000 mg total) by mouth 2 (two) times daily. 06/06/23   Copland, Gwenlyn Found, MD  Vitamin D, Ergocalciferol, (DRISDOL) 1.25 MG (50000 UNIT)  CAPS capsule Take 1 capsule (50,000 Units total) by mouth every 7 (seven) days. 07/27/22   Bradd Canary, MD      Allergies    Valium [diazepam] and Albuterol and levalbuterol    Review of Systems   Review of Systems  Physical Exam Updated Vital Signs BP 122/62   Pulse 72   Temp (!) 97.5 F (36.4 C)   Resp 16   Ht 4\' 10"  (1.473 m)   Wt 49.9 kg   SpO2 95%   BMI 22.99 kg/m  Physical Exam Vitals and nursing note reviewed.  Constitutional:      General: She is not in acute distress.    Appearance: She is well-developed.  HENT:     Head: Normocephalic and atraumatic.  Eyes:     Conjunctiva/sclera: Conjunctivae normal.     Pupils: Pupils are equal, round, and reactive to light.  Cardiovascular:     Rate and  Rhythm: Normal rate and regular rhythm.     Pulses:          Radial pulses are 2+ on the right side and 2+ on the left side.     Heart sounds: No murmur heard. Pulmonary:     Effort: Pulmonary effort is normal. No respiratory distress.     Breath sounds: Normal breath sounds. No decreased breath sounds, wheezing, rhonchi or rales.  Abdominal:     Palpations: Abdomen is soft.     Tenderness: There is abdominal tenderness in the epigastric area.  Musculoskeletal:        General: No swelling. Normal range of motion.     Cervical back: Normal range of motion and neck supple.     Right lower leg: No edema.     Left lower leg: No edema.  Skin:    General: Skin is warm and dry.     Capillary Refill: Capillary refill takes less than 2 seconds.  Neurological:     General: No focal deficit present.     Mental Status: She is alert.  Psychiatric:        Mood and Affect: Mood normal.     ED Results / Procedures / Treatments   Labs (all labs ordered are listed, but only abnormal results are displayed) Labs Reviewed  BRAIN NATRIURETIC PEPTIDE - Abnormal; Notable for the following components:      Result Value   B Natriuretic Peptide 120.5 (*)    All other components within normal limits  COMPREHENSIVE METABOLIC PANEL WITH GFR - Abnormal; Notable for the following components:   BUN 54 (*)    Creatinine, Ser 1.48 (*)    Calcium 8.7 (*)    GFR, Estimated 35 (*)    All other components within normal limits  CBC WITH DIFFERENTIAL/PLATELET - Abnormal; Notable for the following components:   RBC 3.47 (*)    Hemoglobin 10.1 (*)    HCT 30.7 (*)    All other components within normal limits  LIPASE, BLOOD  TROPONIN I (HIGH SENSITIVITY)  TROPONIN I (HIGH SENSITIVITY)    EKG EKG Interpretation Date/Time:  Thursday August 18 2023 17:59:07 EDT Ventricular Rate:  60 PR Interval:  152 QRS Duration:  70 QT Interval:  390 QTC Calculation: 390 R Axis:   13  Text Interpretation: Normal sinus  rhythm When compared with ECG of 26-May-2023 14:11, PREVIOUS ECG IS PRESENT Confirmed by Virgina Norfolk [phone]) on 08/18/2023 6:01:24 PM  Radiology CT Angio Chest/Abd/Pel for Dissection W and/or Wo Contrast Result Date: 08/18/2023 CLINICAL  DATA:  Acute aortic syndrome suspected. Upper abdominal pain and right-sided chest pain for 2 days. Nausea and dizziness today. Vomiting. EXAM: CT ANGIOGRAPHY CHEST, ABDOMEN AND PELVIS TECHNIQUE: Non-contrast CT of the chest was initially obtained. Multidetector CT imaging through the chest, abdomen and pelvis was performed using the standard protocol during bolus administration of intravenous contrast. Multiplanar reconstructed images and MIPs were obtained and reviewed to evaluate the vascular anatomy. RADIATION DOSE REDUCTION: This exam was performed according to the departmental dose-optimization program which includes automated exposure control, adjustment of the mA and/or kV according to patient size and/or use of iterative reconstruction technique. CONTRAST:  80mL OMNIPAQUE IOHEXOL 350 MG/ML SOLN COMPARISON:  Chest radiograph 08/18/2023. CT chest 05/26/2023. CT abdomen and pelvis 02/09/2023 FINDINGS: CTA CHEST FINDINGS Cardiovascular: Unenhanced images of the chest demonstrate scattered calcification in the aorta. No acute intramural hematoma. Images obtained during the arterial phase after intravenous contrast material administration demonstrate normal caliber thoracic aorta. No aortic dissection. Great vessel origins are patent. Normal heart size. No pericardial effusions. Central pulmonary arteries are well opacified. No evidence of significant pulmonary embolus. Mediastinum/Nodes: Large esophageal hiatal hernia. Esophagus is decompressed. No significant lymphadenopathy in the chest. Thyroid gland is unremarkable. Lungs/Pleura: Lobulated nodule in the superior segment of the left lower lung measuring 1 x 1.5 cm in diameter. No additional pulmonary nodules or  consolidation identified. Slight scarring in the lung bases. No pleural effusion or pneumothorax. Musculoskeletal: Degenerative changes in the spine. No focal bone lesion or bone destruction. Review of the MIP images confirms the above findings. CTA ABDOMEN AND PELVIS FINDINGS VASCULAR Aorta: Normal caliber aorta without aneurysm, dissection, vasculitis or significant stenosis. Celiac: Patent without evidence of aneurysm, dissection, vasculitis or significant stenosis. SMA: Patent without evidence of aneurysm, dissection, vasculitis or significant stenosis. Renals: Both renal arteries are patent without evidence of aneurysm, dissection, vasculitis, fibromuscular dysplasia or significant stenosis. IMA: Patent without evidence of aneurysm, dissection, vasculitis or significant stenosis. Inflow: Patent without evidence of aneurysm, dissection, vasculitis or significant stenosis. Veins: No obvious venous abnormality within the limitations of this arterial phase study. Review of the MIP images confirms the above findings. NON-VASCULAR Hepatobiliary: Diffuse fatty infiltration of the liver. No focal lesions. Gallbladder is not visualized, possibly surgically absent or contracted. No bile duct dilatation. Pancreas: Unremarkable. No pancreatic ductal dilatation or surrounding inflammatory changes. Spleen: Normal in size without focal abnormality. Adrenals/Urinary Tract: Adrenal glands are unremarkable. Kidneys are normal, without renal calculi, focal lesion, or hydronephrosis. Bladder is unremarkable. Stomach/Bowel: Stomach, small bowel, and colon are not abnormally distended. No wall thickening or inflammatory changes are appreciated. Stool throughout the colon. Colonic diverticulosis without evidence of acute diverticulitis. Appendix is not identified. Lymphatic: No significant lymphadenopathy. Reproductive: Status post hysterectomy. No adnexal masses. Other: No free air or free fluid in the abdomen. Abdominal wall  musculature appears intact. Edema in the subcutaneous fat over the gluteal regions. Musculoskeletal: Postoperative left hip arthroplasty. Degenerative changes in the spine. Mild lumbar scoliosis convex towards the left. No acute bony abnormalities. Review of the MIP images confirms the above findings. IMPRESSION: 1. Aortic atherosclerosis. No evidence of aneurysm or dissection involving the thoracic or abdominal aorta. 2. Left solid pulmonary nodule measuring 15 mm. Per Fleischner Society Guidelines, consider a non-contrast Chest CT at 3 months, a PET/CT, or tissue sampling. These guidelines do not apply to immunocompromised patients and patients with cancer. Follow up in patients with significant comorbidities as clinically warranted. For lung cancer screening, adhere to Lung-RADS guidelines. Reference: Radiology. 2017;  284(1):228-43. 3. No acute process demonstrated in the abdomen or pelvis. 4. Fatty infiltration of the liver. Electronically Signed   By: Burman Nieves M.D.   On: 08/18/2023 21:01   CT Head Wo Contrast Result Date: 08/18/2023 CLINICAL DATA:  Headache, increasing frequency or severity. Nausea and dizziness. EXAM: CT HEAD WITHOUT CONTRAST TECHNIQUE: Contiguous axial images were obtained from the base of the skull through the vertex without intravenous contrast. RADIATION DOSE REDUCTION: This exam was performed according to the departmental dose-optimization program which includes automated exposure control, adjustment of the mA and/or kV according to patient size and/or use of iterative reconstruction technique. COMPARISON:  04/09/2022, 07/13/2022. FINDINGS: Brain: No acute intracranial hemorrhage, midline shift or mass effect. No extra-axial fluid collection is seen. Diffuse atrophy is noted. Subcortical and periventricular white matter hypodensities are present bilaterally. No hydrocephalus. Vascular: No hyperdense vessel or unexpected calcification. Skull: Normal. Negative for fracture or  focal lesion. Sinuses/Orbits: No acute finding. Other: None. IMPRESSION: 1. No acute intracranial process. 2. Atrophy with chronic microvascular ischemic changes. Electronically Signed   By: Thornell Sartorius M.D.   On: 08/18/2023 20:50   DG Chest 2 View Result Date: 08/18/2023 CLINICAL DATA:  Right chest pain with dizziness, nausea, and vomiting. EXAM: CHEST - 2 VIEW COMPARISON:  05/26/2023. FINDINGS: Heart is enlarged and the mediastinal contour is within normal limits. There is atherosclerotic calcification of the aorta. No consolidation, effusion, or pneumothorax is seen. Degenerative changes are present in the thoracic spine. No acute osseous abnormality. IMPRESSION: No active cardiopulmonary disease. Electronically Signed   By: Thornell Sartorius M.D.   On: 08/18/2023 19:23    Procedures Procedures    Medications Ordered in ED Medications  alum & mag hydroxide-simeth (MAALOX/MYLANTA) 200-200-20 MG/5ML suspension 30 mL (has no administration in time range)  ondansetron (ZOFRAN-ODT) disintegrating tablet 4 mg (4 mg Oral Given 08/18/23 1804)  sodium chloride 0.9 % bolus 1,000 mL (0 mLs Intravenous Stopped 08/18/23 2111)  acetaminophen (TYLENOL) tablet 650 mg (650 mg Oral Given 08/18/23 1943)  ondansetron (ZOFRAN) injection 4 mg (4 mg Intravenous Given 08/18/23 1943)  iohexol (OMNIPAQUE) 350 MG/ML injection 80 mL (80 mLs Intravenous Contrast Given 08/18/23 1954)    ED Course/ Medical Decision Making/ A&P                                 Medical Decision Making Amount and/or Complexity of Data Reviewed Labs: ordered. Radiology: ordered.  Risk OTC drugs. Prescription drug management.   Julie Wilkerson is here chest pain abdominal pain nausea vomiting.  Normal vitals.  No fever.  History of IBS, heart failure, hypertension high cholesterol CHF.  Overall well-appearing.  Tenderness to the upper abdomen.  She was at the gym was doing some biking got nauseous lightheaded threw up several times feeling  better was able to drive herself home.  She was dizzy having some radiating pain up into her chest.  Got Zofran in triage feeling better.  Differential diagnosis could be GI process, IBS flare could be dissection and ACS.  She is neurologically intact.  Have no concern for stroke.  She was little bit dizzy.  Will get CT head CT dissection study check troponin labs CBC BMP give Tylenol Zofran and reevaluate.  EKG shows sinus rhythm.  No ischemic changes.  Per my review and interpretation of labs there is no significant leukocytosis anemia or electrolyte abnormality.  Creatinine mildly elevated but given IV  fluids.  CT head unremarkable per radiology report.  CT dissection study with no acute findings.  She had a pulmonary nodule that she was made aware of in the follow-up with her primary care doctor/oncology about that.  History of breast cancer.  Overall she is feeling better.  I do think this could be an IBS flare or may be stomach inflammation flare.  I have very low concern for ACS or other acute process at this time.  She is feeling much better.  Told return if symptoms worsen.  Discharged in good condition.  This chart was dictated using voice recognition software.  Despite best efforts to proofread,  errors can occur which can change the documentation meaning.         Final Clinical Impression(s) / ED Diagnoses Final diagnoses:  Vomiting, unspecified vomiting type, unspecified whether nausea present  Atypical chest pain  Abdominal pain, unspecified abdominal location    Rx / DC Orders ED Discharge Orders          Ordered    ondansetron (ZOFRAN-ODT) 4 MG disintegrating tablet  Every 8 hours PRN        08/18/23 2112              Virgina Norfolk, DO 08/18/23 2114

## 2023-08-18 NOTE — ED Notes (Signed)
 Pt reports HA after receiving IV contrast, easing off at this time

## 2023-08-18 NOTE — ED Notes (Signed)
 Pt reports upper abd pain & rt. Sided CP  x 2 days and was at the gym today and had nausea and dizziness.   Vomited a few times brown in color Zofran given in triage, no nausea or pain at this time.  Dtg at bedside

## 2023-08-18 NOTE — Discharge Instructions (Addendum)
 Follow-up with oncology/primary care about your pulmonary nodule.  If symptoms worsen.

## 2023-08-28 ENCOUNTER — Other Ambulatory Visit: Payer: Self-pay | Admitting: Family Medicine

## 2023-08-29 MED ORDER — CITALOPRAM HYDROBROMIDE 20 MG PO TABS: 30.0000 mg | ORAL_TABLET | Freq: Every day | ORAL | 3 refills | Status: DC

## 2023-08-29 MED ORDER — POTASSIUM CHLORIDE ER 10 MEQ PO CPCR: 10.0000 meq | ORAL_CAPSULE | Freq: Every day | ORAL | 0 refills | Status: DC

## 2023-09-04 ENCOUNTER — Other Ambulatory Visit: Payer: Self-pay | Admitting: Medical

## 2023-09-06 ENCOUNTER — Encounter: Payer: Self-pay | Admitting: Family Medicine

## 2023-09-07 MED ORDER — FUROSEMIDE 40 MG PO TABS: 40.0000 mg | ORAL_TABLET | ORAL | 0 refills | Status: DC

## 2023-09-18 ENCOUNTER — Other Ambulatory Visit: Payer: Self-pay | Admitting: Family Medicine

## 2023-09-19 MED ORDER — SUCRALFATE 1 G PO TABS: 1.0000 g | ORAL_TABLET | Freq: Two times a day (BID) | ORAL | 1 refills | Status: DC

## 2023-10-07 ENCOUNTER — Other Ambulatory Visit: Payer: Self-pay | Admitting: Family Medicine

## 2023-10-18 DIAGNOSIS — R110 Nausea: Secondary | ICD-10-CM | POA: Diagnosis not present

## 2023-10-18 DIAGNOSIS — K219 Gastro-esophageal reflux disease without esophagitis: Secondary | ICD-10-CM | POA: Diagnosis not present

## 2023-10-18 DIAGNOSIS — R1032 Left lower quadrant pain: Secondary | ICD-10-CM | POA: Diagnosis not present

## 2023-10-18 DIAGNOSIS — K581 Irritable bowel syndrome with constipation: Secondary | ICD-10-CM | POA: Diagnosis not present

## 2023-10-19 ENCOUNTER — Other Ambulatory Visit: Payer: Self-pay | Admitting: Family Medicine

## 2023-10-22 ENCOUNTER — Encounter: Payer: Self-pay | Admitting: Family Medicine

## 2023-10-22 ENCOUNTER — Other Ambulatory Visit: Payer: Self-pay | Admitting: Family Medicine

## 2023-10-23 NOTE — Assessment & Plan Note (Signed)
 Well controlled, no changes to meds. Encouraged heart healthy diet such as the DASH diet and exercise as tolerated.

## 2023-10-23 NOTE — Assessment & Plan Note (Signed)
 [id number] acceptable, minimize simple carbs. Increase exercise as tolerated.

## 2023-10-23 NOTE — Assessment & Plan Note (Signed)
 On Levothyroxine, continue to monitor

## 2023-10-23 NOTE — Assessment & Plan Note (Signed)
 Encourage heart healthy diet such as MIND or DASH diet, increase exercise, avoid trans fats, simple carbohydrates and processed foods, consider a krill or fish or flaxseed oil cap daily.

## 2023-10-23 NOTE — Assessment & Plan Note (Signed)
Euvolemic, no recent exacerbation 

## 2023-10-23 NOTE — Assessment & Plan Note (Signed)
 Supplement and monitor

## 2023-10-24 MED ORDER — LISINOPRIL 10 MG PO TABS: 10.0000 mg | ORAL_TABLET | Freq: Two times a day (BID) | ORAL | 0 refills | Status: DC

## 2023-10-27 ENCOUNTER — Encounter: Payer: Self-pay | Admitting: Family Medicine

## 2023-10-27 ENCOUNTER — Ambulatory Visit (HOSPITAL_BASED_OUTPATIENT_CLINIC_OR_DEPARTMENT_OTHER)
Admission: RE | Admit: 2023-10-27 | Discharge: 2023-10-27 | Disposition: A | Discharge status: Home / Self Care | Source: Ambulatory Visit | Attending: Family Medicine | Admitting: Family Medicine

## 2023-10-27 ENCOUNTER — Ambulatory Visit (INDEPENDENT_AMBULATORY_CARE_PROVIDER_SITE_OTHER): Payer: Medicare HMO | Admitting: Family Medicine

## 2023-10-27 VITALS — BP 126/72 | HR 70 | Resp 16 | Ht <= 58 in | Wt 111.8 lb

## 2023-10-27 DIAGNOSIS — R109 Unspecified abdominal pain: Secondary | ICD-10-CM | POA: Diagnosis not present

## 2023-10-27 DIAGNOSIS — M4312 Spondylolisthesis, cervical region: Secondary | ICD-10-CM | POA: Diagnosis not present

## 2023-10-27 DIAGNOSIS — M542 Cervicalgia: Secondary | ICD-10-CM | POA: Diagnosis not present

## 2023-10-27 DIAGNOSIS — I999 Unspecified disorder of circulatory system: Secondary | ICD-10-CM

## 2023-10-27 DIAGNOSIS — M791 Myalgia, unspecified site: Secondary | ICD-10-CM | POA: Diagnosis not present

## 2023-10-27 DIAGNOSIS — M4722 Other spondylosis with radiculopathy, cervical region: Secondary | ICD-10-CM | POA: Diagnosis not present

## 2023-10-27 DIAGNOSIS — E782 Mixed hyperlipidemia: Secondary | ICD-10-CM

## 2023-10-27 DIAGNOSIS — R0789 Other chest pain: Secondary | ICD-10-CM

## 2023-10-27 DIAGNOSIS — I509 Heart failure, unspecified: Secondary | ICD-10-CM

## 2023-10-27 DIAGNOSIS — N649 Disorder of breast, unspecified: Secondary | ICD-10-CM

## 2023-10-27 DIAGNOSIS — I10 Essential (primary) hypertension: Secondary | ICD-10-CM | POA: Diagnosis not present

## 2023-10-27 DIAGNOSIS — F0670 Mild neurocognitive disorder due to known physiological condition without behavioral disturbance: Secondary | ICD-10-CM | POA: Diagnosis not present

## 2023-10-27 DIAGNOSIS — R202 Paresthesia of skin: Secondary | ICD-10-CM | POA: Diagnosis not present

## 2023-10-27 DIAGNOSIS — R200 Anesthesia of skin: Secondary | ICD-10-CM | POA: Diagnosis not present

## 2023-10-27 DIAGNOSIS — E039 Hypothyroidism, unspecified: Secondary | ICD-10-CM | POA: Diagnosis not present

## 2023-10-27 DIAGNOSIS — R739 Hyperglycemia, unspecified: Secondary | ICD-10-CM

## 2023-10-27 DIAGNOSIS — E559 Vitamin D deficiency, unspecified: Secondary | ICD-10-CM

## 2023-10-27 MED ORDER — FUROSEMIDE 20 MG PO TABS: 20.0000 mg | ORAL_TABLET | Freq: Every day | ORAL | 1 refills | Status: AC

## 2023-10-27 NOTE — Progress Notes (Signed)
 Subjective:    Patient ID: Julie Wilkerson, female    DOB: 19-May-1941, 83 y.o.   MRN: [id number]  Chief Complaint  Patient presents with   Medical Management of Chronic Issues    Patient presents today for a 4 month follow-up.   Quality Metric Gaps    Zoster     HPI Discussed the use of AI scribe software for clinical note transcription with the patient, who gave verbal consent to proceed.  History of Present Illness Julie Wilkerson is an 83 year old female who presents with persistent abdominal pain and bowel irregularities.  She experiences ongoing severe abdominal pain that sometimes radiates to her leg and back, making it difficult to walk. The pain is exacerbated after eating and during bowel movements. She has transitioned from chronic diarrhea to severe constipation, which she manages with lactulose and dietary changes, including daily papaya consumption, helping to normalize her bowel movements. She is under the care of a gastroenterologist, who has tried various medications without success. A colonoscopy and endoscopy are scheduled for later this month to further investigate the cause of her symptoms.  She experiences widespread musculoskeletal pain, including in her arms, legs, and back, which has been present for several months. The pain is described as muscular and is exacerbated by inactivity. She also experiences cramps at night, particularly in her legs, which improve with standing. Numbness in her left arm and hand, particularly in the morning, has been ongoing for several months.  She has a history of breast cancer in the left breast, which was surgically treated. She reports persistent pain and a sensation of hardness in the left breast, which has been present for three to four months. Previous mammograms have shown dense breast tissue but no suspicious masses.  She has a poor appetite, preferring fruits over other foods, and struggles to consume adequate protein due to  concerns about cholesterol. She takes vitamin D  in liquid form and has low calcium  and anemia from previous blood tests.  She lives with family but wants to return to her own home, feeling ready to live independently again.    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 lumpectomy, radtiation x 30   Breast cancer in female [facility])    Bursitis of left hip 09/04/2018   Change in bowel habits 02/22/2018   Change in mole 06/08/2016   Chest pain 03/04/2020   CHF (congestive heart failure) (HCC)    Chronic bilateral thoracic back pain 05/23/2016   Chronic diastolic congestive heart failure (HCC) 05/28/2019   Colitis    Complication of anesthesia    VERY SENSITIVE TO ANESTHESIA    Constipation 01/17/2018   Coronary artery disease involving native coronary artery of native heart without angina pectoris 05/28/2019   Cortical age-related cataract of right eye 04/18/2018   Cough variant asthma  vs UACS from ACEi     Spirometry 12/02/2016  FEV1 1.26 (76%)  Ratio 78 with min curvature p last saba > 6 h prior  - trial off acei 12/02/2016  - 12/02/2016  After extensive coaching HFA effectiveness =    75% with qvar  autohaler > rechallenge with 80 2bid     Decreased visual acuity 01/16/2017   Degeneration of lumbar intervertebral disc 08/01/2018   Diarrhea 11/16/2015   D/o Diverticuli, polyps, celiac disease.     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   Left hand pain 05/07/2018   Left hip pain 03/01/2017   Left leg pain 03/01/2017   Left shoulder pain 05/07/2018   Low back pain 08/10/2015   LUQ pain 06/01/2019   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   Neck pain 03/01/2017   Nuclear sclerotic cataract of right eye 04/18/2018   Osteoporosis    Overweight (BMI 25.0-29.9) 02/14/2019   Pain in joint of left shoulder 08/11/2018   Paroxysmal atrial tachycardia (HCC) 05/28/2019   Pelvic pain 07/02/2018   Personal history of radiation therapy    Pneumonia    PONV (postoperative nausea and vomiting)    Poor appetite 02/22/2018   Pseudophakia of left eye 04/18/2018   PVD (posterior vitreous detachment), right 04/24/2019   Restless sleeper 11/07/2016   Right hip pain 10/23/2017   RLQ discomfort 03/01/2017   RLS (restless legs syndrome) 05/07/2019   S/P left THA, AA 02/13/2019   Sepsis due to pneumonia (HCC) 02/16/2019   Sleep apnea    uses CPAP   Stomach cramps    Tremor 02/15/2016   Urinary frequency 12/22/2017   Urinary incontinence 05/07/2019   Urinary tract infection 11/04/2016   Vitamin D  deficiency 04/29/2016    Past Surgical History:  Procedure Laterality Date   ABDOMINAL HYSTERECTOMY     partial   APPENDECTOMY     BREAST LUMPECTOMY Left 2002   CHOLECYSTECTOMY  Age 27 or 41   COLONOSCOPY  2017   EYE SURGERY Left    cataract   LEFT HEART CATH AND  CORONARY ANGIOGRAPHY N/A 02/09/2019   Procedure: LEFT HEART CATH AND CORONARY ANGIOGRAPHY;  Surgeon: Sammy Crisp, MD;  Location: [facility];  Service: Cardiovascular;  Laterality: N/A;   TONSILLECTOMY     TOTAL HIP ARTHROPLASTY Left 02/13/2019   Procedure: TOTAL HIP ARTHROPLASTY ANTERIOR APPROACH;  Surgeon: Claiborne Crew, MD;  Location: [facility];  Service: Orthopedics;  Laterality: Left;  70 mins   UPPER GASTROINTESTINAL ENDOSCOPY      Family History  Problem Relation Age of Onset   Colitis Mother    Irritable bowel syndrome Mother    Hypertension Father    Hyperlipidemia Sister    Hyperlipidemia Brother    Heart attack Brother    Hyperlipidemia Brother    Heart attack Brother    Hyperlipidemia Brother    Heart attack Brother    Stomach cancer Paternal Grandmother 52   Colon  cancer Paternal Grandmother    Leukemia Daughter    Arthritis Daughter    Heart disease Daughter        ASD vs VSD   Esophageal cancer Neg Hx    Pancreatic cancer Neg Hx    Rectal cancer Neg Hx     Social History   Socioeconomic History   Marital status: Single    Spouse name: Not on file   Number of children: 3   Years of education: 18   Highest education level: Bachelor's degree (e.g., BA, AB, BS)  Occupational History   Occupation: retired    Comment: Runner, broadcasting/film/video, kindergarten  Tobacco Use   Smoking status: Never   Smokeless tobacco: Never  Vaping Use   Vaping status: Never Used  Substance and Sexual Activity   Alcohol use: Never    Alcohol/week: 0.0 standard drinks of alcohol   Drug use: Never   Sexual activity: Not Currently    Birth control/protection: None    Comment: lives alone, avoids daiiry and gluten. 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  Other Topics Concern   Not on file  Social History Narrative   Not on file   Social Drivers of Health   Financial Resource Strain: Low Risk  (10/18/2023)   Received from [facility]   Overall Financial Resource Strain (CARDIA)    Difficulty  of Paying Living Expenses: Not hard at all  Food Insecurity: No Food Insecurity (10/18/2023)   Received from [facility]   Hunger Vital Sign    Worried About Running Out of Food in the Last Year: Never true    Ran Out of Food in the Last Year: Never true  Transportation Needs: No Transportation Needs (10/18/2023)   Received from [facility] - Transportation    Lack of Transportation (Medical): No    Lack of Transportation (Non-Medical): No  Physical Activity: Sufficiently Active (06/14/2023)   Exercise Vital Sign    Days of Exercise per Week: 3 days    Minutes of Exercise per Session: 60 min  Recent Concern: Physical Activity - Inactive (05/24/2023)   Exercise Vital Sign    Days of Exercise per Week: 0 days    Minutes of Exercise per Session: 20 min  Stress: No Stress Concern Present (06/14/2023)   [employer] - Occupational Stress Questionnaire    Feeling of Stress : Not at all  Social Connections: Socially Integrated (10/18/2023)   Received from [facility]   Social Network    How would you rate your social network (family, work, friends)?: Good participation with social networks  Intimate Partner Violence: Not At Risk (10/18/2023)   Received from [facility]   HITS    Over the last 12 months how often did your partner physically hurt you?: Never    Over the last 12 months how often did your partner insult you or talk down to you?: Never    Over the last 12 months how often did your partner threaten you with physical harm?: Never    Over the last 12 months how often did your partner scream or curse at you?: Never    Outpatient Medications Prior to Visit  Medication Sig Dispense Refill   ASPIRIN  81 PO 81 mg daily.      citalopram  (CELEXA ) 20 MG tablet Take 1.5 tablets (30 mg total) by mouth at bedtime. 45 tablet 3   estradiol  (ESTRACE ) 0.1 MG/GM vaginal cream Place 0.5 g vaginally 2 (two) times a week. Place 0.5g  nightly for two weeks  then twice a week after 42.5 g 11   fexofenadine  (ALLEGRA  ALLERGY ) 180 MG tablet Take 1 tablet (180 mg total) by mouth daily as needed for allergies or rhinitis.     FLOVENT  HFA 110 MCG/ACT inhaler INHALE 1 TO 2 PUFFS BY MOUTH TWICE DAILY AS NEEDED 12 g 0   hyoscyamine  (LEVSIN SL) 0.125 MG SL tablet Dissolve 1 tablet under tongue at least twice daily 60 tablet 2   levothyroxine  (SYNTHROID ) 50 MCG tablet TAKE 1 TABLET BY MOUTH ONCE DAILY BEFORE BREAKFAST 90 tablet 0   lisinopril  (ZESTRIL ) 10 MG tablet Take 1 tablet (10 mg total) by mouth 2 (two) times daily. 180 tablet 0   omega-3 acid ethyl esters (LOVAZA ) 1 g capsule Take 2 g by mouth daily.     omeprazole  (PRILOSEC) 40 MG capsule Take 1 capsule (40 mg total) by mouth 2 (two) times daily. 60 capsule 5   ondansetron  (ZOFRAN -ODT) 4 MG disintegrating tablet Take 1 tablet (4 mg total) by mouth every 8 (eight) hours as needed. 20 tablet 0   Peppermint Oil (IBGARD) 90 MG CPCR Use as directed     potassium chloride  (MICRO-K ) 10 MEQ CR capsule Take 1 capsule (10 mEq total) by mouth daily. 90 capsule 0   sucralfate  (CARAFATE ) 1 g tablet Take 1 tablet (1 g total) by mouth 2 (two) times daily. 180 tablet 1   valACYclovir  (VALTREX ) 1000 MG tablet Take 1 tablet (1,000 mg total) by mouth 2 (two) times daily. 14 tablet 0   Vitamin D , Ergocalciferol , (DRISDOL ) 1.25 MG (50000 UNIT) CAPS capsule Take 1 capsule (50,000 Units total) by mouth every 7 (seven) days. 5 capsule 4   furosemide  (LASIX ) 40 MG tablet Take 1 tablet (40 mg total) by mouth every other day. 45 tablet 0   doxycycline  (VIBRAMYCIN ) 100 MG capsule Take 1 capsule (100 mg total) by mouth 2 (two) times daily. 20 capsule 0   predniSONE  (DELTASONE ) 20 MG tablet Take 40 mg by mouth daily for 3 days, then 20 mg by mouth daily for 3 days 9 tablet 0   No facility-administered medications prior to visit.    Allergies  Allergen Reactions   Valium  [Diazepam ] Shortness Of Breath    Sob and bradycardia    Albuterol  And Levalbuterol  Palpitations    Episode of palpitations, tremor and anxiety when administered levalbuterol  for PFT. Recommend avoiding this medicaiton class    Review of Systems  Constitutional:  Positive for malaise/fatigue. Negative for fever.  HENT:  Negative for congestion.   Eyes:  Negative for blurred vision.  Respiratory:  Negative for shortness of breath.   Cardiovascular:  Negative for chest pain, palpitations and leg swelling.  Gastrointestinal:  Positive for abdominal pain and diarrhea. Negative for blood in stool and nausea.  Genitourinary:  Positive for urgency. Negative for dysuria and frequency.  Musculoskeletal:  Positive for back pain, joint pain and myalgias. Negative for falls.  Skin:  Negative for rash.  Neurological:  Negative for dizziness, loss of consciousness and headaches.  Endo/Heme/Allergies:  Negative for environmental allergies.  Psychiatric/Behavioral:  Negative for depression. The patient is not nervous/anxious.        Objective:     Physical Exam Constitutional:      General: She is not in acute distress.    Appearance: Normal appearance. She is well-developed. She is not toxic-appearing.  HENT:     Head: Normocephalic and atraumatic.     Right Ear: External ear normal.  Left Ear: External ear normal.     Nose: Nose normal.  Eyes:     General:        Right eye: No discharge.        Left eye: No discharge.     Conjunctiva/sclera: Conjunctivae normal.  Neck:     Thyroid : No thyromegaly.  Cardiovascular:     Rate and Rhythm: Normal rate and regular rhythm.     Heart sounds: No murmur heard.    Friction rub present.  Pulmonary:     Effort: Pulmonary effort is normal. No respiratory distress.     Breath sounds: Normal breath sounds.  Abdominal:     General: Bowel sounds are normal.     Palpations: Abdomen is soft.     Tenderness: There is no abdominal tenderness. There is no guarding.  Genitourinary:    Comments: Irregular  shaped lesion at roughly 9 oclock tender palpation Musculoskeletal:        General: Normal range of motion.     Cervical back: Neck supple.  Lymphadenopathy:     Cervical: No cervical adenopathy.  Skin:    General: Skin is warm and dry.  Neurological:     Mental Status: She is alert and oriented to person, place, and time.  Psychiatric:        Mood and Affect: Mood normal.        Behavior: Behavior normal.        Thought Content: Thought content normal.        Judgment: Judgment normal.     BP 126/72   Pulse 70   Resp 16   Ht 4\' 10"  (1.473 m)   Wt 111 lb 12.8 oz (50.7 kg)   SpO2 96%   BMI 23.37 kg/m  Wt Readings from Last 3 Encounters:  10/27/23 111 lb 12.8 oz (50.7 kg)  08/18/23 110 lb (49.9 kg)  07/04/23 111 lb 4 oz (50.5 kg)    Diabetic Foot Exam - Simple   No data filed    Lab Results  Component Value Date   WBC 6.0 08/18/2023   HGB 10.1 (L) 08/18/2023   HCT 30.7 (L) 08/18/2023   PLT 195 08/18/2023   GLUCOSE 98 08/18/2023   CHOL 261 (H) 11/09/2022   TRIG 173.0 (H) 11/09/2022   HDL 60.90 11/09/2022   LDLCALC 165 (H) 11/09/2022   ALT 16 08/18/2023   AST 22 08/18/2023   NA 138 08/18/2023   K 4.6 08/18/2023   CL 102 08/18/2023   CREATININE 1.48 (H) 08/18/2023   BUN 54 (H) 08/18/2023   CO2 22 08/18/2023   TSH 1.90 11/09/2022   INR 1.1 07/21/2022   HGBA1C 6.1 07/26/2022    Lab Results  Component Value Date   TSH 1.90 11/09/2022   Lab Results  Component Value Date   WBC 6.0 08/18/2023   HGB 10.1 (L) 08/18/2023   HCT 30.7 (L) 08/18/2023   MCV 88.5 08/18/2023   PLT 195 08/18/2023   Lab Results  Component Value Date   NA 138 08/18/2023   K 4.6 08/18/2023   CO2 22 08/18/2023   GLUCOSE 98 08/18/2023   BUN 54 (H) 08/18/2023   CREATININE 1.48 (H) 08/18/2023   BILITOT 0.6 08/18/2023   ALKPHOS 65 08/18/2023   AST 22 08/18/2023   ALT 16 08/18/2023   PROT 7.0 08/18/2023   ALBUMIN  3.8 08/18/2023   CALCIUM  8.7 (L) 08/18/2023   ANIONGAP 14  08/18/2023   GFR 51.59 (L) 05/26/2023  Lab Results  Component Value Date   CHOL 261 (H) 11/09/2022   Lab Results  Component Value Date   HDL 60.90 11/09/2022   Lab Results  Component Value Date   LDLCALC 165 (H) 11/09/2022   Lab Results  Component Value Date   TRIG 173.0 (H) 11/09/2022   Lab Results  Component Value Date   CHOLHDL 4 11/09/2022   Lab Results  Component Value Date   HGBA1C 6.1 07/26/2022       Assessment & Plan:  Congestive heart failure, unspecified HF chronicity, unspecified heart failure type (HCC) Assessment & Plan: Euvolemic, no recent exacerbation  Orders: -     Ambulatory referral to Cardiology  Essential hypertension Assessment & Plan: Well controlled, no changes to meds. Encouraged heart healthy diet such as the DASH diet and exercise as tolerated.    Orders: -     Ambulatory referral to Cardiology  Hyperglycemia Assessment & Plan: hgba1c acceptable, minimize simple carbs. Increase exercise as tolerated.   Orders: -     Hemoglobin A1c -     Ambulatory referral to Cardiology  Mixed hyperlipidemia Assessment & Plan: Encourage heart healthy diet such as MIND or DASH diet, increase exercise, avoid trans fats, simple carbohydrates and processed foods, consider a krill or fish or flaxseed oil cap daily.   Orders: -     Lipid panel  Hypothyroidism, unspecified type Assessment & Plan: On Levothyroxine , continue to monitor  Orders: -     Ambulatory referral to Cardiology  Vitamin D  deficiency Assessment & Plan: Supplement and monitor    Hypocalcemia -     VITAMIN D  25 Hydroxy (Vit-D Deficiency, Fractures) -     Comprehensive metabolic panel with GFR  Myalgia Assessment & Plan: Hydrate and monitor   Orders: -     Magnesium  -     High sensitivity CRP -     Sedimentation rate -     CBC with Differential/Platelet  Neck pain -     DG Cervical Spine Complete; Future  Paresthesia of left upper extremity -     DG  Cervical Spine Complete; Future  Breast lesion -     US  LIMITED ULTRASOUND INCLUDING AXILLA LEFT BREAST ; Future  Atypical chest pain -     Ambulatory referral to Cardiology  Mild vascular neurocognitive disorder -     Ambulatory referral to Cardiology  Abdominal pain, unspecified abdominal location Assessment & Plan: Continues to follow closely with gastroenterology and she has an upper endoscopy and colonoscopy scheduled for later this month   Neck pain on left side Assessment & Plan: With paresthesias to left arm. Check cervical xray to start. Use moist heat and gentle stretching as tolerated. May try Tylenol  and prescription meds as directed and report if symptoms worsen or seek immediate care    Other orders -     Furosemide ; Take 1 tablet (20 mg total) by mouth daily.  Dispense: 90 tablet; Refill: 1    Assessment and Plan Assessment & Plan       Randie Bustle, MD

## 2023-10-28 LAB — COMPREHENSIVE METABOLIC PANEL WITH GFR
ALT: 14 U/L (ref 0–35)
AST: 19 U/L (ref 0–37)
Albumin: 4.2 g/dL (ref 3.5–5.2)
Alkaline Phosphatase: 71 U/L (ref 39–117)
BUN: 46 mg/dL — ABNORMAL HIGH (ref 6–23)
CO2: 25 meq/L (ref 19–32)
Calcium: 8.7 mg/dL (ref 8.4–10.5)
Chloride: 104 meq/L (ref 96–112)
Creatinine, Ser: 1.24 mg/dL — ABNORMAL HIGH (ref 0.40–1.20)
GFR: 40.69 mL/min — ABNORMAL LOW (ref >60.00)
Glucose, Bld: 92 mg/dL (ref 70–99)
Potassium: 4.3 meq/L (ref 3.5–5.1)
Sodium: 139 meq/L (ref 135–145)
Total Bilirubin: 0.4 mg/dL (ref 0.2–1.2)
Total Protein: 6.8 g/dL (ref 6.0–8.3)

## 2023-10-28 LAB — LIPID PANEL
Cholesterol: 275 mg/dL — ABNORMAL HIGH (ref 0–200)
HDL: 67.6 mg/dL (ref >39.00)
LDL Cholesterol: 182 mg/dL — ABNORMAL HIGH (ref 0–99)
NonHDL: 207.45
Total CHOL/HDL Ratio: 4
Triglycerides: 127 mg/dL (ref 0.0–149.0)
VLDL: 25.4 mg/dL (ref 0.0–40.0)

## 2023-10-28 LAB — CBC WITH DIFFERENTIAL/PLATELET
Basophils Absolute: 0 10*3/uL (ref 0.0–0.1)
Basophils Relative: 0.7 % (ref 0.0–3.0)
Eosinophils Absolute: 0 10*3/uL (ref 0.0–0.7)
Eosinophils Relative: 0 % (ref 0.0–5.0)
HCT: 31.4 % — ABNORMAL LOW (ref 36.0–46.0)
Hemoglobin: 10.6 g/dL — ABNORMAL LOW (ref 12.0–15.0)
Lymphocytes Relative: 40.9 % (ref 12.0–46.0)
Lymphs Abs: 2 10*3/uL (ref 0.7–4.0)
MCHC: 33.7 g/dL (ref 30.0–36.0)
MCV: 88.2 fl (ref 78.0–100.0)
Monocytes Absolute: 0.7 10*3/uL (ref 0.1–1.0)
Monocytes Relative: 13.7 % — ABNORMAL HIGH (ref 3.0–12.0)
Neutro Abs: 2.2 10*3/uL (ref 1.4–7.7)
Neutrophils Relative %: 44.7 % (ref 43.0–77.0)
Platelets: 192 10*3/uL (ref 150.0–400.0)
RBC: 3.56 Mil/uL — ABNORMAL LOW (ref 3.87–5.11)
RDW: 15.1 % (ref 11.5–15.5)
WBC: 4.9 10*3/uL (ref 4.0–10.5)

## 2023-10-28 LAB — SEDIMENTATION RATE: Sed Rate: 29 mm/h (ref 0–30)

## 2023-10-28 LAB — MAGNESIUM: Magnesium: 2.1 mg/dL (ref 1.5–2.5)

## 2023-10-28 LAB — HEMOGLOBIN A1C: Hgb A1c MFr Bld: 6.2 % (ref 4.6–6.5)

## 2023-10-28 LAB — HIGH SENSITIVITY CRP: CRP, High Sensitivity: 2.8 mg/L (ref 0.000–5.000)

## 2023-10-29 DIAGNOSIS — M791 Myalgia, unspecified site: Secondary | ICD-10-CM | POA: Insufficient documentation

## 2023-10-29 NOTE — Assessment & Plan Note (Signed)
 Continues to follow closely with gastroenterology and she has an upper endoscopy and colonoscopy scheduled for later this month

## 2023-10-29 NOTE — Assessment & Plan Note (Signed)
 With paresthesias to left arm. Check cervical xray to start. Use moist heat and gentle stretching as tolerated. May try Tylenol  and prescription meds as directed and report if symptoms worsen or seek immediate care

## 2023-10-29 NOTE — Assessment & Plan Note (Signed)
 Hydrate and monitor

## 2023-10-31 LAB — VITAMIN D 25 HYDROXY (VIT D DEFICIENCY, FRACTURES): VITD: 40.49 ng/mL (ref 30.00–100.00)

## 2023-11-01 ENCOUNTER — Ambulatory Visit: Payer: Self-pay | Admitting: Family Medicine

## 2023-11-01 ENCOUNTER — Other Ambulatory Visit: Payer: Self-pay | Admitting: Family Medicine

## 2023-11-01 DIAGNOSIS — N649 Disorder of breast, unspecified: Secondary | ICD-10-CM

## 2023-11-04 NOTE — Telephone Encounter (Signed)
 Copied from CRM [id number]. Topic: Clinical - Lab/Test Results >> Nov 04, 2023 11:00 AM Allyne Areola wrote: Reason for CRM: Patient is returning a call she received from [place], [place] of Dr.Blyth's message. Patient understood and had no questions.

## 2023-11-04 NOTE — Progress Notes (Signed)
 Called patient and no anser left vm to return call. If patient calls back ok to advise of result note.

## 2023-11-17 DIAGNOSIS — R1032 Left lower quadrant pain: Secondary | ICD-10-CM | POA: Diagnosis not present

## 2023-11-17 DIAGNOSIS — K449 Diaphragmatic hernia without obstruction or gangrene: Secondary | ICD-10-CM | POA: Diagnosis not present

## 2023-11-17 DIAGNOSIS — D122 Benign neoplasm of ascending colon: Secondary | ICD-10-CM | POA: Diagnosis not present

## 2023-11-17 DIAGNOSIS — K3189 Other diseases of stomach and duodenum: Secondary | ICD-10-CM | POA: Diagnosis not present

## 2023-11-17 DIAGNOSIS — R110 Nausea: Secondary | ICD-10-CM | POA: Diagnosis not present

## 2023-11-17 DIAGNOSIS — K219 Gastro-esophageal reflux disease without esophagitis: Secondary | ICD-10-CM | POA: Diagnosis not present

## 2023-11-17 DIAGNOSIS — D120 Benign neoplasm of cecum: Secondary | ICD-10-CM | POA: Diagnosis not present

## 2023-11-17 LAB — HM COLONOSCOPY

## 2023-11-23 DIAGNOSIS — H524 Presbyopia: Secondary | ICD-10-CM | POA: Diagnosis not present

## 2023-11-29 ENCOUNTER — Other Ambulatory Visit: Payer: Self-pay | Admitting: Pulmonary Disease

## 2023-11-29 ENCOUNTER — Other Ambulatory Visit: Payer: Self-pay | Admitting: Family Medicine

## 2023-11-29 DIAGNOSIS — J4520 Mild intermittent asthma, uncomplicated: Secondary | ICD-10-CM

## 2023-11-30 ENCOUNTER — Ambulatory Visit
Admission: RE | Admit: 2023-11-30 | Discharge: 2023-11-30 | Disposition: A | Discharge status: Home / Self Care | Source: Ambulatory Visit | Attending: Family Medicine | Admitting: Family Medicine

## 2023-11-30 DIAGNOSIS — N649 Disorder of breast, unspecified: Secondary | ICD-10-CM

## 2023-11-30 DIAGNOSIS — N644 Mastodynia: Secondary | ICD-10-CM | POA: Diagnosis not present

## 2023-12-01 NOTE — Telephone Encounter (Signed)
 Copied from CRM [id number]. Topic: Clinical - Prescription Issue >> Dec 01, 2023 12:52 PM Russell PARAS wrote: Reason for CRM:   [place], with [facility] pharmacy, is contacting clinic due to need for clarification of prescription for Flovent  HFA 110 MCG. The prescription specifically states to not fill with generic version of medication.  However, Flovent  has been discontinued, and the generic version is now the only avail medication.   She requests either clarification by phone that she can fill with generic medication, or have new prescription sent in.   CB#  [phone]  FX#  [phone]   Dr. Kara can you advise if OK to fill with generic version

## 2023-12-07 ENCOUNTER — Telehealth: Payer: Self-pay

## 2023-12-07 MED ORDER — ARNUITY ELLIPTA 100 MCG/ACT IN AEPB: 1.0000 | INHALATION_SPRAY | Freq: Every day | RESPIRATORY_TRACT | 5 refills | Status: AC

## 2023-12-07 NOTE — Addendum Note (Signed)
 Addended by: HOPE ALMARIE ORN on: 12/07/2023 04:49 PM   Modules accepted: Orders

## 2023-12-07 NOTE — Telephone Encounter (Signed)
 Ok to interchange with Arnuity, RX sent

## 2023-12-07 NOTE — Telephone Encounter (Signed)
 Copied from CRM [id number]. Topic: Clinical - Prescription Issue >> Dec 05, 2023 11:06 AM Rilla NOVAK wrote: [facility] pharmacy, regarding clarification of prescription for Flovent  HFA 110 MCG. The prescription specifically states to not fill with generic version of medication.   Please confirm by phone that she can fill with generic medication, or fax new prescription:    CB#  [phone]   FX#  [phone]   LM - okay to use generic   NFN-

## 2023-12-07 NOTE — Telephone Encounter (Signed)
 Copied from CRM [id number]. Topic: Clinical - Medication Question >> Dec 07, 2023 12:16 PM Celestine FALCON wrote: Reason for CRM: Comer from [facility] pharmacy is calling regarding medication prescription FLOVENT  HFA 110 MCG/ACT inhaler. They Tried to do the generic, but insurance doesn't want to cover the inhaler in brand or generic. They will cover Arnuity inhaler. Phone number [phone] for a call back to the pharmacy.     Sent request to provider and  spoke with patient    NFN-

## 2023-12-13 DIAGNOSIS — H3562 Retinal hemorrhage, left eye: Secondary | ICD-10-CM | POA: Diagnosis not present

## 2023-12-14 ENCOUNTER — Other Ambulatory Visit (HOSPITAL_COMMUNITY): Payer: Self-pay

## 2023-12-14 ENCOUNTER — Ambulatory Visit: Attending: Cardiovascular Disease | Admitting: Cardiology

## 2023-12-14 ENCOUNTER — Encounter: Payer: Self-pay | Admitting: Cardiology

## 2023-12-14 VITALS — BP 126/66 | HR 66 | Ht <= 58 in | Wt 109.8 lb

## 2023-12-14 DIAGNOSIS — E782 Mixed hyperlipidemia: Secondary | ICD-10-CM | POA: Diagnosis not present

## 2023-12-14 DIAGNOSIS — I10 Essential (primary) hypertension: Secondary | ICD-10-CM

## 2023-12-14 DIAGNOSIS — R7303 Prediabetes: Secondary | ICD-10-CM | POA: Insufficient documentation

## 2023-12-14 MED ORDER — ROSUVASTATIN CALCIUM 20 MG PO TABS
20.0000 mg | ORAL_TABLET | Freq: Every day | ORAL | 3 refills | Status: DC
Start: 2023-12-14 — End: 2024-02-17
  Filled 2023-12-14: qty 90, 90d supply, fill #0

## 2023-12-14 NOTE — Progress Notes (Signed)
 Cardiology Office Note:  .   Date:  12/14/2023  ID:  Julie Wilkerson, DOB 1940-07-13, MRN [id number] PCP: Domenica Harlene LABOR, MD  [facility] Providers Cardiologist:  Newman Lawrence, MD PCP: Domenica Harlene LABOR, MD  Chief Complaint  Patient presents with   Hypertension   nonobstructive CAD   Central Sleep apnea     Julie Wilkerson is a 83 y.o. female with hypertension, hyperlipidemia, nonobstructive CAD, central sleep apnea, hypothyroidism, h/o breast cancer  History of Present Illness  Patient is doing well, denies any chest pain. She has had cath and CTA in the past 3-5 years that showed no significant CAD, only minimal calcification. Recently, she had had some abdominal pain for which she is seeing gastroenterologist at [facility] and found to have colonic diverticulitis.  Reviewed recent lipid results, LDL is quite high.      Vitals:   12/14/23 1051  BP: 126/66  Pulse: 66  SpO2: 94%     Review of Systems  Cardiovascular:  Negative for chest pain, dyspnea on exertion, leg swelling, palpitations and syncope.        Studies Reviewed: SABRA        EKG 12/14/2023: Normal sinus rhythm Normal ECG When compared with ECG of 18-Aug-2023 17:59, No significant change was found  Labs 10/2023: Chol 275, TG 127, HDL 67, LDL 182 HbA1C 6.2% Hb 10.6 Cr 1.2 TSH 1.9  CTA C/A/P 07/2023: 1. Aortic atherosclerosis. No evidence of aneurysm or dissection involving the thoracic or abdominal aorta. 2. Left solid pulmonary nodule measuring 15 mm. Per Fleischner Society Guidelines, consider a non-contrast Chest CT at 3 months, a PET/CT, or tissue sampling. These guidelines do not apply to immunocompromised patients and patients with cancer. Follow up in patients with significant comorbidities as clinically warranted. For lung cancer screening, adhere to Lung-RADS guidelines. Reference: Radiology. 2017; 284(1):228-43. 3. No acute process demonstrated in the abdomen or pelvis. 4. Fatty  infiltration of the liver.     Small focus Physical Exam Vitals and nursing note reviewed.  Constitutional:      General: She is not in acute distress. Neck:     Vascular: No JVD.  Cardiovascular:     Rate and Rhythm: Normal rate and regular rhythm.     Heart sounds: Normal heart sounds. No murmur heard. Pulmonary:     Effort: Pulmonary effort is normal.     Breath sounds: Normal breath sounds. No wheezing or rales.  Musculoskeletal:     Right lower leg: No edema.     Left lower leg: No edema.      VISIT DIAGNOSES:   ICD-10-CM   1. Essential hypertension  I10 EKG 12-Lead    2. Mixed hyperlipidemia  E78.2 Lipid panel    Lipid panel    3. Prediabetes  R73.03 HgB A1c    HgB A1c       Julie Wilkerson is a 83 y.o. female with hypertension, hyperlipidemia, nonobstructive CAD, central sleep apnea, hypothyroidism, h/o breast cancer Assessment & Plan  Mixed hyperlipidemia: LDL 182.  Minimal coronary calcification noted on CT in 2023.  When she does not need aspirin , recommend Crestor  20 mg daily.  Check lipid panel in 3 months.  Given that her A1c is 6.2%, would like to recheck it in 3 months as rosuvastatin  can increase A1c, but benefits of statin still outweigh the risks.  Hypertension: Well-controlled on lisinopril .    Meds ordered this encounter  Medications   rosuvastatin  (CRESTOR ) 20 MG tablet  Sig: Take 1 tablet (20 mg total) by mouth daily.    Dispense:  90 tablet    Refill:  3     F/u in 3 months w/APP  Signed, Newman JINNY Lawrence, MD

## 2023-12-14 NOTE — Patient Instructions (Signed)
 Medication Instructions:   STOP aspirin    START Rosuvastatin  (Crestor ) 20 mg daily   *If you need a refill on your cardiac medications before your next appointment, please call your pharmacy*  Lab Work: Lipid panel (3 months)  A1C blood work (3 months)  If you have labs (blood work) drawn today and your tests are completely normal, you will receive your results only by: MyChart Message (if you have MyChart) OR A paper copy in the mail If you have any lab test that is abnormal or we need to change your treatment, we will call you to review the results.  Your next appointment:   3 month(s)  Provider:   One of our Advanced Practice Providers (APPs): Morse Clause, PA-C  Lamarr Satterfield, NP Miriam Shams, NP  Olivia Pavy, PA-C Josefa Beauvais, NP  Leontine Salen, PA-C Orren Fabry, PA-C  [place], PA-C Ernest Dick, NP  Damien Braver, NP Jon Hails, PA-C  Waddell Donath, PA-C    Dayna Dunn, PA-C  Scott Weaver, PA-C Lum Louis, NP Katlyn West, NP Callie Goodrich, PA-C  Evan Williams, PA-C Sheng Haley, PA-C  Xika Zhao, NP Kathleen Johnson, PA-C   We recommend signing up for the patient portal called MyChart.  Sign up information is provided on this After Visit Summary.  MyChart is used to connect with patients for Virtual Visits (Telemedicine).  Patients are able to view lab/test results, encounter notes, upcoming appointments, etc.  Non-urgent messages can be sent to your provider as well.   To learn more about what you can do with MyChart, go to [email or web address].

## 2023-12-30 ENCOUNTER — Other Ambulatory Visit: Payer: Self-pay | Admitting: Family

## 2023-12-31 ENCOUNTER — Other Ambulatory Visit: Payer: Self-pay | Admitting: Family Medicine

## 2023-12-31 DIAGNOSIS — E039 Hypothyroidism, unspecified: Secondary | ICD-10-CM

## 2024-01-05 ENCOUNTER — Encounter: Payer: Self-pay | Admitting: Family Medicine

## 2024-01-06 MED ORDER — CITALOPRAM HYDROBROMIDE 20 MG PO TABS: 30.0000 mg | ORAL_TABLET | Freq: Every day | ORAL | 3 refills | Status: DC

## 2024-01-11 ENCOUNTER — Other Ambulatory Visit: Payer: Self-pay | Admitting: Family Medicine

## 2024-01-11 MED ORDER — OMEPRAZOLE 40 MG PO CPDR: 40.0000 mg | DELAYED_RELEASE_CAPSULE | Freq: Two times a day (BID) | ORAL | 5 refills | Status: AC

## 2024-01-11 NOTE — Telephone Encounter (Signed)
 Please advise on omperazole 40mg 

## 2024-01-15 ENCOUNTER — Other Ambulatory Visit: Payer: Self-pay | Admitting: Family Medicine

## 2024-02-02 ENCOUNTER — Encounter: Payer: Self-pay | Admitting: Family Medicine

## 2024-02-02 ENCOUNTER — Ambulatory Visit (INDEPENDENT_AMBULATORY_CARE_PROVIDER_SITE_OTHER): Admitting: Family Medicine

## 2024-02-02 ENCOUNTER — Ambulatory Visit: Payer: Self-pay

## 2024-02-02 VITALS — BP 115/50 | HR 74 | Ht <= 58 in | Wt 110.0 lb

## 2024-02-02 DIAGNOSIS — L089 Local infection of the skin and subcutaneous tissue, unspecified: Secondary | ICD-10-CM

## 2024-02-02 MED ORDER — CEPHALEXIN 500 MG PO CAPS: 500.0000 mg | ORAL_CAPSULE | Freq: Two times a day (BID) | ORAL | 0 refills | Status: AC

## 2024-02-02 NOTE — Telephone Encounter (Signed)
 FYI Only or Action Required?: Action required by provider: request for appointment.  Patient was last seen in primary care on 10/27/2023 by Domenica Harlene LABOR, MD.  Called Nurse Triage reporting Recurrent Skin Infections.  Symptoms began several days ago.  Interventions attempted: Rest, hydration, or home remedies.  Symptoms are: gradually worsening. Pea sized boil, outside vagina. Tender. Has had in the past.  Triage Disposition: See Physician Within 24 Hours  Patient/caregiver understands and will follow disposition?: Yes   Copied from CRM #[id number]. Topic: Clinical - Red Word Triage >> Feb 02, 2024  7:46 AM Franky GRADE wrote: Red Word that prompted transfer to Nurse Triage: Patient's daughter Jon is calling because the patient has a bump on the groin area that is severely painful. Reason for Disposition  [1] Boil > 1/2 inch across (> 12 mm; larger than a marble) AND [2] center is soft or pus colored  Answer Assessment - Initial Assessment Questions 1. APPEARANCE of BOIL: What does the boil look like?      bump 2. LOCATION: Where is the boil located?      groin 3. NUMBER: How many boils are there?      1 outside 4. SIZE: How big is the boil? (e.g., inches, cm; compare to size of a coin or other object)     pea 5. ONSET: When did the boil start?     2-3 days 6. PAIN: Is there any pain? If Yes, ask: How bad is the pain?   (Scale 1-10; or mild, moderate, severe)     mild 7. FEVER: Do you have a fever? If Yes, ask: What is it, how was it measured, and when did it start?      no 8. SOURCE: Have you been around anyone with boils or other Staph infections? Have you ever had boils before?     no 9. OTHER SYMPTOMS: Do you have any other symptoms? (e.g., shaking chills, weakness, rash elsewhere on body)     no 10. PREGNANCY: Is there any chance you are pregnant? When was your last menstrual period?       no  Protocols used: Boil (Skin Abscess)-A-AH

## 2024-02-02 NOTE — Progress Notes (Signed)
   Acute Office Visit  Subjective:     Patient ID: Julie Wilkerson, female    DOB: Jul 28, 1940, 83 y.o.   MRN: [id number]  Chief Complaint  Patient presents with   Skin Problem    HPI Patient is in today for skin concern.   Discussed the use of AI scribe software for clinical note transcription with the patient, who gave verbal consent to proceed.  History of Present Illness Julie Wilkerson is an 83 year old female who presents with a possible skin infection in her pubic area.  She has an irritated area, present for three days, which is painful and severe enough to disturb her sleep. The pain is described as 'horrible' and stabbing. The area is swollen and tender, but there is no drainage.       ROS All review of systems negative except what is listed in the HPI      Objective:    BP (!) 115/50   Pulse 74   Ht 4' 10 (1.473 m)   Wt 110 lb (49.9 kg)   SpO2 98%   BMI 22.99 kg/m    Physical Exam Vitals reviewed.  Constitutional:      Appearance: Normal appearance.  Skin:    General: Skin is warm and dry.     Comments: Small area of excoriation vs folliculitis near pubic symphysis with surrounding erythema, mild inflammation, tender to palpation   Neurological:     Mental Status: She is alert and oriented to person, place, and time.  Psychiatric:        Mood and Affect: Mood normal.        Behavior: Behavior normal.        Thought Content: Thought content normal.        Judgment: Judgment normal.       No results found for any visits on 02/02/24.      Assessment & Plan:   Problem List Items Addressed This Visit   None Visit Diagnoses       Skin infection    -  Primary   Relevant Medications   cephALEXin  (KEFLEX ) 500 MG capsule      Assessment & Plan  Early abscess vs folliculitis with tenderness and discomfort - Prescribed Keflex . - Advised warm compresses 3-4 times daily. - Instructed to clean area with warm soapy water during showers. - Can  try Neosporin application if area opens - Advised wearing loose clothing.      Meds ordered this encounter  Medications   cephALEXin  (KEFLEX ) 500 MG capsule    Sig: Take 1 capsule (500 mg total) by mouth 2 (two) times daily for 5 days.    Dispense:  10 capsule    Refill:  0    Supervising Provider:   DOMENICA BLACKBIRD A [[id number]]    Return if symptoms worsen or fail to improve.  Waddell KATHEE Mon, NP

## 2024-02-17 ENCOUNTER — Other Ambulatory Visit: Payer: Self-pay | Admitting: Cardiology

## 2024-02-17 ENCOUNTER — Other Ambulatory Visit: Payer: Self-pay | Admitting: Family Medicine

## 2024-02-17 ENCOUNTER — Telehealth: Payer: Self-pay | Admitting: Cardiology

## 2024-02-17 MED ORDER — ROSUVASTATIN CALCIUM 20 MG PO TABS: 20.0000 mg | ORAL_TABLET | Freq: Every day | ORAL | 3 refills | Status: DC

## 2024-02-17 NOTE — Telephone Encounter (Signed)
 Rx sent to [facility]

## 2024-02-17 NOTE — Telephone Encounter (Signed)
*  STAT* If patient is at the pharmacy, call can be transferred to refill team.   1. Which medications need to be refilled? (please list name of each medication and dose if known)   rosuvastatin  (CRESTOR ) 20 MG tablet     2. Would you like to learn more about the convenience, safety, & potential cost savings by using the [facility]? No   3. Are you open to using the [facility] (Type [facility]. No.   4. Which pharmacy/location (including street and city if local pharmacy) is medication to be sent to? [facility] - [place], [place] - [address]     5. Do they need a 30 day or 90 day supply? 100 day   McKenzie with [facility] calling to request a 100 day supply Rx be written for pt. Please advise.

## 2024-02-24 DIAGNOSIS — R112 Nausea with vomiting, unspecified: Secondary | ICD-10-CM | POA: Diagnosis not present

## 2024-02-24 DIAGNOSIS — K219 Gastro-esophageal reflux disease without esophagitis: Secondary | ICD-10-CM | POA: Diagnosis not present

## 2024-02-24 DIAGNOSIS — K449 Diaphragmatic hernia without obstruction or gangrene: Secondary | ICD-10-CM | POA: Diagnosis not present

## 2024-02-24 DIAGNOSIS — K581 Irritable bowel syndrome with constipation: Secondary | ICD-10-CM | POA: Diagnosis not present

## 2024-03-01 ENCOUNTER — Other Ambulatory Visit: Payer: Self-pay | Admitting: Family Medicine

## 2024-03-07 DIAGNOSIS — H04123 Dry eye syndrome of bilateral lacrimal glands: Secondary | ICD-10-CM | POA: Diagnosis not present

## 2024-03-07 DIAGNOSIS — H1045 Other chronic allergic conjunctivitis: Secondary | ICD-10-CM | POA: Diagnosis not present

## 2024-03-07 DIAGNOSIS — H0288A Meibomian gland dysfunction right eye, upper and lower eyelids: Secondary | ICD-10-CM | POA: Diagnosis not present

## 2024-03-07 DIAGNOSIS — H0288B Meibomian gland dysfunction left eye, upper and lower eyelids: Secondary | ICD-10-CM | POA: Diagnosis not present

## 2024-03-18 NOTE — Progress Notes (Unsigned)
 Cardiology Office Note:    Date:  03/20/2024   ID:  Julie Wilkerson, DOB 1940/08/07, MRN [id number]  PCP:  Domenica Harlene LABOR, MD   [facility] Providers Cardiologist:  Newman JINNY Lawrence, MD     Referring MD: Domenica Harlene LABOR, MD   No chief complaint on file.   History of Present Illness:    Julie Wilkerson is a 83 y.o. 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  She underwent nuclear stress test in 2018 that showed normal perfusion, no ischemia.  Echocardiogram in 2020 showed normal BiV function and no significant valvular disease.  She underwent a left heart catheterization 01/2019 that showed mild to moderate nonobstructive CAD including sequential 30% proximal and mid LAD stenosis, 50% ostial D1 lesion, and 20% ostial/proximal LM CA and mid RCA lesions.  No definite evidence of myocardial bridge.  Following this study she was cleared for orthopedic surgery.  Heart monitor November 2020 show paroxysmal SVT.  Repeat echocardiogram in 2022 continues to show normal BiV function and no significant valvular disease.  She underwent CT coronary 11/2021 that demonstrated moderate-sized hiatal hernia, and minimal coronary calcium  score with no obstructive disease noted.  She was last seen by Dr. Lawrence 11/2023 noted to have an LDL of 182 and started on Crestor  20 mg daily.  A1c noted 6.2%.  She presents back today for routine follow-up.  She reports onset of leg pain about 3 weeks ago, worse with walking but also occurs at rest. Unclear if this is vascular, side effect of statin, or other etiology. She has chronic SOB with asthma, exacerbated by current weather.   She is also having intermittent dizziness. This could be related to borderline BP. She is only taking lisinopril  10 mg daily, not BID. She is taking 20 mg lasix  daily, not 40 mg. She is taking potassium 10  meq with 20 mg lasix .    Past Medical History:  Diagnosis Date   Abdominal pain 11/07/2016   Accelerating angina (HCC) 02/09/2019   Acute diastolic CHF (congestive heart failure) (HCC) 02/16/2019   Acute on chronic diastolic CHF (congestive heart failure) (HCC) 02/23/2019   Acute pansinusitis 04/26/2017   Amblyopia of eye, left 04/19/2018   Anemia    Anginal pain    Anxiety    Arthritis    Asthma    Atypical chest pain 02/04/2019   Bilateral carotid bruits 03/28/2017   Blood transfusion without reported diagnosis    Breast cancer (HCC)    2002, left, encapsulated, microcalcifications. lumpectomy, radtiation x 30   Breast cancer in female [facility])    Bursitis of left hip 09/04/2018   Change in bowel habits 02/22/2018   Change in mole 06/08/2016   Chest pain 03/04/2020   CHF (congestive heart failure) (HCC)    Chronic bilateral thoracic back pain 05/23/2016   Chronic diastolic congestive heart failure (HCC) 05/28/2019   Colitis    Complication of anesthesia    VERY SENSITIVE TO ANESTHESIA    Constipation 01/17/2018   Coronary artery disease involving native coronary artery of native heart without angina pectoris 05/28/2019   Cortical age-related cataract of right eye 04/18/2018   Cough variant asthma  vs UACS from ACEi     Spirometry 12/02/2016  FEV1 1.26 (76%)  Ratio 78 with min curvature p last saba > 6 h prior  - trial off acei 12/02/2016  - 12/02/2016  After extensive coaching HFA effectiveness =    75% with qvar  autohaler > rechallenge with 80 2bid     Decreased visual acuity 01/16/2017   Degeneration of lumbar intervertebral disc 08/01/2018   Diarrhea 11/16/2015   D/o Diverticuli, polyps, celiac disease.     Dyspnea 11/04/2016   with asthma exacerbation only   Dysuria 11/04/2016   Elevated sed rate 10/23/2017   Essential hypertension    Trial off acei 12/02/2016 due to cough/ pseudoasthma   GERD (gastroesophageal reflux disease)    Gluten intolerance    History of  chicken pox    History of Helicobacter pylori infection 08/01/2015   History of rectal bleeding 06/01/2019   Hx of LASIK 04/24/2019   Hyperglycemia 11/07/2016   Hyperlipidemia    Hypertension    Hyponatremia 02/16/2019   Hypothyroidism    IBS (irritable bowel syndrome)    Lattice degeneration of right retina 04/24/2019   Left hand pain 05/07/2018   Left hip pain 03/01/2017   Left leg pain 03/01/2017   Left shoulder pain 05/07/2018   Low back pain 08/10/2015   LUQ pain 06/01/2019   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   Neck pain 03/01/2017   Nuclear sclerotic cataract of right eye 04/18/2018   Osteoporosis    Overweight (BMI 25.0-29.9) 02/14/2019   Pain in joint of left shoulder 08/11/2018   Paroxysmal atrial tachycardia 05/28/2019   Pelvic pain 07/02/2018   Personal history of radiation therapy    Pneumonia    PONV (postoperative nausea and vomiting)    Poor appetite 02/22/2018   Pseudophakia of left eye 04/18/2018   PVD (posterior vitreous detachment), right 04/24/2019   Restless sleeper 11/07/2016   Right hip pain 10/23/2017   RLQ discomfort 03/01/2017   RLS (restless legs syndrome) 05/07/2019   S/P left THA, AA 02/13/2019   Sepsis due to pneumonia (HCC) 02/16/2019   Sleep apnea    uses CPAP   Stomach cramps    Tremor 02/15/2016   Urinary frequency 12/22/2017   Urinary incontinence 05/07/2019   Urinary tract infection 11/04/2016   Vitamin D  deficiency 04/29/2016    Past Surgical History:  Procedure Laterality Date   ABDOMINAL HYSTERECTOMY     partial   APPENDECTOMY     BREAST LUMPECTOMY Left 2002   CHOLECYSTECTOMY  Age 4 or 41   COLONOSCOPY  2017   EYE SURGERY Left    cataract   LEFT HEART CATH AND CORONARY ANGIOGRAPHY N/A 02/09/2019   Procedure: LEFT HEART CATH AND CORONARY ANGIOGRAPHY;  Surgeon: Mady Bruckner, MD;  Location: [facility];  Service: Cardiovascular;  Laterality: N/A;   TONSILLECTOMY      TOTAL HIP ARTHROPLASTY Left 02/13/2019   Procedure: TOTAL HIP ARTHROPLASTY ANTERIOR APPROACH;  Surgeon: Ernie Cough, MD;  Location: [facility];  Service: Orthopedics;  Laterality: Left;  70 mins   UPPER GASTROINTESTINAL ENDOSCOPY      Current Medications: Current Meds  Medication Sig   citalopram  (CELEXA ) 20 MG tablet Take 1.5 tablets (30 mg total) by mouth at bedtime.   DULoxetine  (CYMBALTA ) 30 MG capsule Take 30 mg by mouth daily.   estradiol  (ESTRACE ) 0.1 MG/GM vaginal cream Place 0.5 g vaginally 2 (two) times a week. Place 0.5g nightly for two weeks then twice a week after   fexofenadine  (ALLEGRA  ALLERGY ) 180 MG tablet Take 1 tablet (180 mg total) by mouth daily as needed for allergies or rhinitis.   Fluticasone  Furoate (ARNUITY ELLIPTA ) 100 MCG/ACT  AEPB Inhale 1 puff into the lungs daily.   furosemide  (LASIX ) 20 MG tablet Take 1 tablet (20 mg total) by mouth daily.   hyoscyamine  (LEVSIN  SL) 0.125 MG SL tablet Dissolve 1 tablet under tongue at least twice daily   levothyroxine  (SYNTHROID ) 50 MCG tablet TAKE 1 TABLET BY MOUTH ONCE DAILY BEFORE BREAKFAST   Loteprednol Etabonate 0.5 % GEL Apply 1 drop to eye 4 (four) times daily.   Magnesium  Citrate (MAGNESIUM  GUMMIES PO) Take by mouth daily.   omega-3 acid ethyl esters (LOVAZA ) 1 g capsule Take 2 g by mouth daily.   omeprazole  (PRILOSEC) 40 MG capsule Take 1 capsule (40 mg total) by mouth 2 (two) times daily.   ondansetron  (ZOFRAN -ODT) 4 MG disintegrating tablet Take 4 mg by mouth every 8 (eight) hours as needed.   Peppermint Oil (IBGARD) 90 MG CPCR Use as directed   potassium chloride  (MICRO-K ) 10 MEQ CR capsule Take 1 capsule (10 mEq total) by mouth daily.   sucralfate  (CARAFATE ) 1 g tablet Take 1 tablet (1 g total) by mouth 2 (two) times daily.   valACYclovir  (VALTREX ) 1000 MG tablet Take 1 tablet (1,000 mg total) by mouth 2 (two) times daily.   Vitamin D , Ergocalciferol , (DRISDOL ) 1.25 MG (50000 UNIT) CAPS capsule Take 1 capsule  (50,000 Units total) by mouth every 7 (seven) days.   XDEMVY 0.25 % SOLN Apply 1 drop to eye 2 (two) times daily.   [DISCONTINUED] furosemide  (LASIX ) 40 MG tablet TAKE 1 TABLET BY MOUTH EVERY OTHER DAY   [DISCONTINUED] lisinopril  (ZESTRIL ) 10 MG tablet Take 1 tablet (10 mg total) by mouth 2 (two) times daily.   [DISCONTINUED] rosuvastatin  (CRESTOR ) 20 MG tablet Take 1 tablet (20 mg total) by mouth daily.     Allergies:   Valium  [diazepam ] and Albuterol  and levalbuterol    Social History   Socioeconomic History   Marital status: Single    Spouse name: Not on file   Number of children: 3   Years of education: 18   Highest education level: Bachelor's degree (e.g., BA, AB, BS)  Occupational History   Occupation: retired    Comment: runner, broadcasting/film/video, kindergarten  Tobacco Use   Smoking status: Never   Smokeless tobacco: Never  Vaping Use   Vaping status: Never Used  Substance and Sexual Activity   Alcohol use: Never    Alcohol/week: 0.0 standard drinks of alcohol   Drug use: Never   Sexual activity: Not Currently    Birth control/protection: None    Comment: lives alone, avoids daiiry and gluten. 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  Other Topics Concern   Not on file  Social History Narrative   Not on file   Social Drivers of Health   Financial Resource Strain: Low Risk  (10/18/2023)   Received from [facility]   Overall Financial Resource Strain (CARDIA)    Difficulty of Paying Living Expenses: Not hard at all  Food Insecurity: No Food Insecurity (10/18/2023)   [facility]   Hunger Vital Sign    Within the past 12 months, you worried that your food would run out before you got the money to buy more.: Never true    Within the past 12 months, the food you bought just didn't last and you didn't have money to get more.: Never true  Transportation Needs: No Transportation Needs (10/18/2023)   Received from [facility] - Transportation    Lack of Transportation  (Medical): No    Lack of Transportation (  Non-Medical): No  Physical Activity: Sufficiently Active (06/14/2023)   Exercise Vital Sign    Days of Exercise per Week: 3 days    Minutes of Exercise per Session: 60 min  Recent Concern: Physical Activity - Inactive (05/24/2023)   Exercise Vital Sign    Days of Exercise per Week: 0 days    Minutes of Exercise per Session: 20 min  Stress: No Stress Concern Present (06/14/2023)   [employer] - Occupational Stress Questionnaire    Feeling of Stress : Not at all  Social Connections: Socially Integrated (10/18/2023)   Received from [facility]   Social Network    How would you rate your social network (family, work, friends)?: Good participation with social networks     Family History: The patient's family history includes Arthritis in her daughter; Colitis in her mother; Colon cancer in her paternal grandmother; Heart attack in her brother, brother, and brother; Heart disease in her daughter; Hyperlipidemia in her brother, brother, brother, and sister; Hypertension in her father; Irritable bowel syndrome in her mother; Leukemia in her daughter; Stomach cancer (age of onset: 74) in her paternal grandmother. There is no history of Esophageal cancer, Pancreatic cancer, or Rectal cancer.  ROS:   Please see the history of present illness.     All other systems reviewed and are negative.  EKGs/Labs/Other Studies Reviewed:    The following studies were reviewed today:       Recent Labs: 08/18/2023: B Natriuretic Peptide 120.5 10/27/2023: ALT 14; BUN 46; Creatinine, Ser 1.24; Hemoglobin 10.6; Magnesium  2.1; Platelets 192.0; Potassium 4.3; Sodium 139  Recent Lipid Panel    Component Value Date/Time   CHOL 275 (H) 10/27/2023 1518   TRIG 127.0 10/27/2023 1518   HDL 67.60 10/27/2023 1518   CHOLHDL 4 10/27/2023 1518   VLDL 25.4 10/27/2023 1518   LDLCALC 182 (H) 10/27/2023 1518   LDLCALC 79 02/01/2020 1020     Risk  Assessment/Calculations:                Physical Exam:    VS:  BP (!) 102/58   Pulse 72   Ht 4' 10 (1.473 m)   Wt 111 lb 6.4 oz (50.5 kg)   SpO2 97%   BMI 23.28 kg/m     Wt Readings from Last 3 Encounters:  03/20/24 111 lb 6.4 oz (50.5 kg)  02/02/24 110 lb (49.9 kg)  12/14/23 109 lb 12.8 oz (49.8 kg)     GEN:  Well nourished, well developed in no acute distress HEENT: Normal NECK: No JVD; No carotid bruits LYMPHATICS: No lymphadenopathy CARDIAC: RRR, no murmurs, rubs, gallops RESPIRATORY:  Clear to auscultation without rales, wheezing or rhonchi  ABDOMEN: Soft, non-tender, non-distended MUSCULOSKELETAL:  No edema; No deformity  SKIN: Warm and dry NEUROLOGIC:  Alert and oriented x 3 PSYCHIATRIC:  Normal affect   ASSESSMENT:    1. Mixed hyperlipidemia   2. Claudication   3. Hyperlipidemia with target LDL less than 70   4. Bilateral leg pain   5. Claudication in peripheral vascular disease   6. Primary hypertension   7. Coronary artery disease involving native coronary artery of native heart without angina pectoris    PLAN:    In order of problems listed above:  Hyperlipidemia with LDL less than 55 - Currently on 20 mg Crestor  - Consider lower LDL goal given diabetes -- may be having myalgias -leg pain is interfering with her daily activities and did not start until  after initiation of Crestor  -- LDL 182 -- will refer to lipid clinic pharmD   Leg pain - with rest and worse with walking - statin myalgia vs PAD - will recommend statin holiday and ABI/arterial US    Hypertension - She has been taking 10 mg lisinopril  every morning and a as needed extra 10 mg of lisinopril  in the evening for blood pressure in the 140s - BP today is borderline and she reports intermittent dizziness - I will reduce her lisinopril  to 5 mg twice daily with a blood pressure log - She can continue 20 mg Lasix  daily   Nonobstructive CAD - Continue aspirin , statin - No chest  pain   Follow up in Dec as scheduled.            Medication Adjustments/Labs and Tests Ordered: Current medicines are reviewed at length with the patient today.  Concerns regarding medicines are outlined above.  Orders Placed This Encounter  Procedures   AMB Referral to [facility] Pharm-D   VAS US  LOWER EXTREMITY ARTERIAL DUPLEX   VAS US  ABI WITH/WO TBI   Meds ordered this encounter  Medications   lisinopril  (ZESTRIL ) 10 MG tablet    Sig: Take 0.5 tablets (5 mg total) by mouth 2 (two) times daily.    Dose change (decreased on 03/20/24).    Patient Instructions  Medication Instructions:  Your physician has recommended you make the following change in your medication:   1) STOP rosuvastatin  (Crestor ) 2) DECREASE lisinopril  to 5 mg twice daily  *If you need a refill on your cardiac medications before your next appointment, please call your pharmacy*  Testing/Procedures: Your physician has requested that you have an ankle brachial index (ABI). During this test an ultrasound and blood pressure cuff are used to evaluate the arteries that supply the arms and legs with blood. Allow thirty minutes for this exam. There are no restrictions or special instructions.  Please note: We ask at that you not bring children with you during ultrasound (echo/ vascular) testing. Due to room size and safety concerns, children are not allowed in the ultrasound rooms during exams. Our front office staff cannot provide observation of children in our lobby area while testing is being conducted. An adult accompanying a patient to their appointment will only be allowed in the ultrasound room at the discretion of the ultrasound technician under special circumstances. We apologize for any inconvenience.  Your physician has requested that you have a lower extremity arterial duplex. This test is an ultrasound of the arteries in the legs or arms. It looks at arterial blood flow in the legs and arms. Allow one  hour for Lower and Upper Arterial scans. There are no restrictions or special instructions.  Please note: We ask at that you not bring children with you during ultrasound (echo/ vascular) testing. Due to room size and safety concerns, children are not allowed in the ultrasound rooms during exams. Our front office staff cannot provide observation of children in our lobby area while testing is being conducted. An adult accompanying a patient to their appointment will only be allowed in the ultrasound room at the discretion of the ultrasound technician under special circumstances. We apologize for any inconvenience.  Follow-Up: At [facility], you and your health needs are our priority.  As part of our continuing mission to provide you with exceptional heart care, our providers are all part of one team.  This team includes your primary Cardiologist (physician) and Advanced Practice Providers or APPs (  Physician Assistants and Nurse Practitioners) who all work together to provide you with the care you need, when you need it.  Your next appointment:   1 month(s)  Provider:   Newman JINNY Lawrence, MD or Jon Hails, PA-C     We recommend signing up for the patient portal called MyChart.  Sign up information is provided on this After Visit Summary.  MyChart is used to connect with patients for Virtual Visits (Telemedicine).  Patients are able to view lab/test results, encounter notes, upcoming appointments, etc.  Non-urgent messages can be sent to your provider as well.    To learn more about what you can do with MyChart, go to [email or web address].    Signed, Jon Nat Hails, [place]  03/20/2024 12:31 PM    [facility]

## 2024-03-18 NOTE — Assessment & Plan Note (Signed)
Cymbalta 60 mg daily. °

## 2024-03-18 NOTE — Assessment & Plan Note (Signed)
 Well controlled, no changes to meds. Encouraged heart healthy diet such as the DASH diet and exercise as tolerated.

## 2024-03-18 NOTE — Assessment & Plan Note (Signed)
 [id number] acceptable, minimize simple carbs. Increase exercise as tolerated.

## 2024-03-18 NOTE — Assessment & Plan Note (Signed)
 No recent exacerbation

## 2024-03-18 NOTE — Assessment & Plan Note (Signed)
 Supplement and monitor

## 2024-03-18 NOTE — Assessment & Plan Note (Signed)
 On Levothyroxine, continue to monitor

## 2024-03-18 NOTE — Assessment & Plan Note (Signed)
 Encourage heart healthy diet such as MIND or DASH diet, increase exercise, avoid trans fats, simple carbohydrates and processed foods, consider a krill or fish or flaxseed oil cap daily.

## 2024-03-20 ENCOUNTER — Ambulatory Visit: Attending: Physician Assistant | Admitting: Physician Assistant

## 2024-03-20 ENCOUNTER — Encounter: Payer: Self-pay | Admitting: Physician Assistant

## 2024-03-20 VITALS — BP 102/58 | HR 72 | Ht <= 58 in | Wt 111.4 lb

## 2024-03-20 DIAGNOSIS — E782 Mixed hyperlipidemia: Secondary | ICD-10-CM | POA: Diagnosis not present

## 2024-03-20 DIAGNOSIS — I2510 Atherosclerotic heart disease of native coronary artery without angina pectoris: Secondary | ICD-10-CM | POA: Diagnosis not present

## 2024-03-20 DIAGNOSIS — H52223 Regular astigmatism, bilateral: Secondary | ICD-10-CM | POA: Diagnosis not present

## 2024-03-20 DIAGNOSIS — I10 Essential (primary) hypertension: Secondary | ICD-10-CM

## 2024-03-20 DIAGNOSIS — I739 Peripheral vascular disease, unspecified: Secondary | ICD-10-CM

## 2024-03-20 DIAGNOSIS — M79604 Pain in right leg: Secondary | ICD-10-CM | POA: Diagnosis not present

## 2024-03-20 DIAGNOSIS — M79605 Pain in left leg: Secondary | ICD-10-CM | POA: Diagnosis not present

## 2024-03-20 DIAGNOSIS — H524 Presbyopia: Secondary | ICD-10-CM | POA: Diagnosis not present

## 2024-03-20 DIAGNOSIS — E785 Hyperlipidemia, unspecified: Secondary | ICD-10-CM

## 2024-03-20 MED ORDER — LISINOPRIL 10 MG PO TABS: 5.0000 mg | ORAL_TABLET | Freq: Two times a day (BID) | ORAL | Status: DC

## 2024-03-20 NOTE — Patient Instructions (Signed)
 Medication Instructions:  Your physician has recommended you make the following change in your medication:   1) STOP rosuvastatin  (Crestor ) 2) DECREASE lisinopril  to 5 mg twice daily  *If you need a refill on your cardiac medications before your next appointment, please call your pharmacy*  Testing/Procedures: Your physician has requested that you have an ankle brachial index (ABI). During this test an ultrasound and blood pressure cuff are used to evaluate the arteries that supply the arms and legs with blood. Allow thirty minutes for this exam. There are no restrictions or special instructions.  Please note: We ask at that you not bring children with you during ultrasound (echo/ vascular) testing. Due to room size and safety concerns, children are not allowed in the ultrasound rooms during exams. Our front office staff cannot provide observation of children in our lobby area while testing is being conducted. An adult accompanying a patient to their appointment will only be allowed in the ultrasound room at the discretion of the ultrasound technician under special circumstances. We apologize for any inconvenience.  Your physician has requested that you have a lower extremity arterial duplex. This test is an ultrasound of the arteries in the legs or arms. It looks at arterial blood flow in the legs and arms. Allow one hour for Lower and Upper Arterial scans. There are no restrictions or special instructions.  Please note: We ask at that you not bring children with you during ultrasound (echo/ vascular) testing. Due to room size and safety concerns, children are not allowed in the ultrasound rooms during exams. Our front office staff cannot provide observation of children in our lobby area while testing is being conducted. An adult accompanying a patient to their appointment will only be allowed in the ultrasound room at the discretion of the ultrasound technician under special circumstances. We  apologize for any inconvenience.  Follow-Up: At [facility], you and your health needs are our priority.  As part of our continuing mission to provide you with exceptional heart care, our providers are all part of one team.  This team includes your primary Cardiologist (physician) and Advanced Practice Providers or APPs (Physician Assistants and Nurse Practitioners) who all work together to provide you with the care you need, when you need it.  Your next appointment:   1 month(s)  Provider:   Newman JINNY Lawrence, MD or Jon Hails, PA-C     We recommend signing up for the patient portal called MyChart.  Sign up information is provided on this After Visit Summary.  MyChart is used to connect with patients for Virtual Visits (Telemedicine).  Patients are able to view lab/test results, encounter notes, upcoming appointments, etc.  Non-urgent messages can be sent to your provider as well.    To learn more about what you can do with MyChart, go to [email or web address].

## 2024-03-22 ENCOUNTER — Ambulatory Visit: Admitting: Family Medicine

## 2024-03-22 ENCOUNTER — Encounter: Payer: Self-pay | Admitting: Family Medicine

## 2024-03-22 VITALS — BP 140/72 | HR 71 | Ht <= 58 in | Wt 112.0 lb

## 2024-03-22 DIAGNOSIS — T466X5A Adverse effect of antihyperlipidemic and antiarteriosclerotic drugs, initial encounter: Secondary | ICD-10-CM

## 2024-03-22 DIAGNOSIS — R739 Hyperglycemia, unspecified: Secondary | ICD-10-CM

## 2024-03-22 DIAGNOSIS — D649 Anemia, unspecified: Secondary | ICD-10-CM | POA: Diagnosis not present

## 2024-03-22 DIAGNOSIS — J4520 Mild intermittent asthma, uncomplicated: Secondary | ICD-10-CM | POA: Diagnosis not present

## 2024-03-22 DIAGNOSIS — Z23 Encounter for immunization: Secondary | ICD-10-CM

## 2024-03-22 DIAGNOSIS — E559 Vitamin D deficiency, unspecified: Secondary | ICD-10-CM

## 2024-03-22 DIAGNOSIS — I5032 Chronic diastolic (congestive) heart failure: Secondary | ICD-10-CM | POA: Diagnosis not present

## 2024-03-22 DIAGNOSIS — I10 Essential (primary) hypertension: Secondary | ICD-10-CM

## 2024-03-22 DIAGNOSIS — E782 Mixed hyperlipidemia: Secondary | ICD-10-CM | POA: Diagnosis not present

## 2024-03-22 DIAGNOSIS — G720 Drug-induced myopathy: Secondary | ICD-10-CM | POA: Diagnosis not present

## 2024-03-22 DIAGNOSIS — E039 Hypothyroidism, unspecified: Secondary | ICD-10-CM

## 2024-03-22 DIAGNOSIS — F419 Anxiety disorder, unspecified: Secondary | ICD-10-CM | POA: Diagnosis not present

## 2024-03-22 LAB — COMPREHENSIVE METABOLIC PANEL WITH GFR
ALT: 12 U/L (ref 0–35)
AST: 18 U/L (ref 0–37)
Albumin: 4.3 g/dL (ref 3.5–5.2)
Alkaline Phosphatase: 69 U/L (ref 39–117)
BUN: 35 mg/dL — ABNORMAL HIGH (ref 6–23)
CO2: 27 meq/L (ref 19–32)
Calcium: 8.8 mg/dL (ref 8.4–10.5)
Chloride: 104 meq/L (ref 96–112)
Creatinine, Ser: 1.09 mg/dL (ref 0.40–1.20)
GFR: 47.37 mL/min — ABNORMAL LOW (ref >60.00)
Glucose, Bld: 85 mg/dL (ref 70–99)
Potassium: 4.5 meq/L (ref 3.5–5.1)
Sodium: 139 meq/L (ref 135–145)
Total Bilirubin: 0.6 mg/dL (ref 0.2–1.2)
Total Protein: 6.8 g/dL (ref 6.0–8.3)

## 2024-03-22 LAB — CBC WITH DIFFERENTIAL/PLATELET
Basophils Absolute: 0 K/uL (ref 0.0–0.1)
Basophils Relative: 0.5 % (ref 0.0–3.0)
Eosinophils Absolute: 0 K/uL (ref 0.0–0.7)
Eosinophils Relative: 0 % (ref 0.0–5.0)
HCT: 33.5 % — ABNORMAL LOW (ref 36.0–46.0)
Hemoglobin: 11 g/dL — ABNORMAL LOW (ref 12.0–15.0)
Lymphocytes Relative: 31.5 % (ref 12.0–46.0)
Lymphs Abs: 1.5 K/uL (ref 0.7–4.0)
MCHC: 32.7 g/dL (ref 30.0–36.0)
MCV: 89.5 fl (ref 78.0–100.0)
Monocytes Absolute: 0.6 K/uL (ref 0.1–1.0)
Monocytes Relative: 12.3 % — ABNORMAL HIGH (ref 3.0–12.0)
Neutro Abs: 2.6 K/uL (ref 1.4–7.7)
Neutrophils Relative %: 55.7 % (ref 43.0–77.0)
Platelets: 194 K/uL (ref 150.0–400.0)
RBC: 3.74 Mil/uL — ABNORMAL LOW (ref 3.87–5.11)
RDW: 15.1 % (ref 11.5–15.5)
WBC: 4.7 K/uL (ref 4.0–10.5)

## 2024-03-22 LAB — LIPID PANEL
Cholesterol: 166 mg/dL (ref 0–200)
HDL: 75.9 mg/dL (ref >39.00)
LDL Cholesterol: 68 mg/dL (ref 0–99)
NonHDL: 89.74
Total CHOL/HDL Ratio: 2
Triglycerides: 109 mg/dL (ref 0.0–149.0)
VLDL: 21.8 mg/dL (ref 0.0–40.0)

## 2024-03-22 MED ORDER — CITALOPRAM HYDROBROMIDE 20 MG PO TABS: 20.0000 mg | ORAL_TABLET | Freq: Every day | ORAL | 3 refills | Status: AC

## 2024-03-22 NOTE — Patient Instructions (Addendum)
 Shingrix is the new shingles shot, 2 shots over 2-6 months, confirm coverage with insurance and document, then can return here for shots with nurse appt or at pharmacy   COVID and flu (given today) annually  Tetanus in 2027 or sooner if injured  Prevnar 20 given today  RSV, Respiratory Syncitial Virus vaccine, Arexvy at pharmacy  All [facility] pharmacies are walk in vaccine clinics M-F 9-4     Drop Citalopram  20 mg tabs to 1 tab daily for a month and then 1/2 tab daily x another and then can stop. If pain or anxiety worsen we can restart 10 mg daily  Diet for Irritable Bowel Syndrome When you have irritable bowel syndrome (IBS), it is very important to follow the eating habits that are best for your condition. IBS may cause various symptoms, such as pain in the abdomen, constipation, or diarrhea. Choosing the right foods can help to ease the discomfort from these symptoms. Work with your health care provider and dietitian to find the eating plan that will help to control your symptoms. What are tips for following this plan?  Keep a food diary. This will help you identify foods that cause symptoms. Write down: What you eat and when you eat it. What symptoms you have. When symptoms occur in relation to your meals, such as pain in abdomen 2 hours after dinner. Eat your meals slowly and in a relaxed setting. Aim to eat 5-6 small meals per day. Do not skip meals. Drink enough fluid to keep your urine pale yellow. Ask your health care provider if you should take an over-the-counter probiotic to help restore healthy bacteria in your gut (digestive tract). Probiotics are foods that contain good bacteria and yeasts. Your dietitian may have specific dietary recommendations for you based on your symptoms. Your dietitian may recommend that you: Avoid foods that cause symptoms. Talk with your dietitian about other ways to get the same nutrients that are in those problem foods. Avoid foods with  gluten. Gluten is a protein that is found in rye, wheat, and barley. Eat more foods that contain soluble fiber. Examples of foods with high soluble fiber include oats, seeds, and certain fruits and vegetables. Take a fiber supplement if told by your dietitian. Reduce or avoid certain foods called FODMAPs. These are foods that contain sugars that are hard for some people to digest. Ask your health care provider which foods to avoid. What foods should I avoid? The following are some foods and drinks that may make your symptoms worse: Fatty foods, such as french fries. Foods that contain gluten, such as pasta and cereal. Dairy products, such as milk, cheese, and ice cream. Spicy foods. Alcohol. Products with caffeine, such as coffee, tea, or chocolate. Carbonated drinks, such as soda. Foods that are high in FODMAPs. These include certain fruits and vegetables. Products with sweeteners such as honey, high fructose corn syrup, sorbitol, and mannitol. The items listed above may not be a complete list of foods and beverages you should avoid. Contact a dietitian for more information. What foods are good sources of fiber? Your health care provider or dietitian may recommend that you eat more foods that contain fiber. Fiber can help to reduce constipation and other IBS symptoms. Add foods with fiber to your diet a little at a time so your body can get used to them. Too much fiber at one time might cause gas and swelling of your abdomen. The following are some foods that are good sources of  fiber: Berries, such as raspberries, strawberries, and blueberries. Tomatoes. Carrots. Brown rice. Oats. Seeds, such as chia and pumpkin seeds. The items listed above may not be a complete list of recommended sources of fiber. Contact your dietitian for more options. Where to find more information [employer] Functional Gastrointestinal Disorders: aboutibs.[employer] of Diabetes and  Digestive and Kidney Diseases: stagesync.si Summary When you have irritable bowel syndrome (IBS), it is very important to follow the eating habits that are best for your condition. IBS may cause various symptoms, such as pain in the abdomen, constipation, or diarrhea. Choosing the right foods can help to ease the discomfort that comes from symptoms. Your health care provider or dietitian may recommend that you eat more foods that contain fiber. Keep a food diary. This will help you identify foods that cause symptoms. This information is not intended to replace advice given to you by your health care provider. Make sure you discuss any questions you have with your health care provider. Document Revised: 04/21/2021 Document Reviewed: 04/21/2021 Elsevier Patient Education  2024 [employer].

## 2024-03-22 NOTE — Assessment & Plan Note (Signed)
 Had severe leg pains on Rosuvastatin  improving discontinuation, is following with cardiology

## 2024-03-23 ENCOUNTER — Ambulatory Visit: Payer: Self-pay | Admitting: Family Medicine

## 2024-03-23 LAB — TSH: TSH: 1.62 u[IU]/mL (ref 0.35–5.50)

## 2024-03-23 LAB — HEMOGLOBIN A1C: Hgb A1c MFr Bld: 6.2 % (ref 4.6–6.5)

## 2024-03-23 LAB — IRON,TIBC AND FERRITIN PANEL
%SAT: 23 % (ref 16–45)
Ferritin: 14 ng/mL — ABNORMAL LOW (ref 16–288)
Iron: 81 ug/dL (ref 45–160)
TIBC: 353 ug/dL (ref 250–450)

## 2024-03-23 LAB — VITAMIN D 25 HYDROXY (VIT D DEFICIENCY, FRACTURES): VITD: 39.67 ng/mL (ref 30.00–100.00)

## 2024-03-25 ENCOUNTER — Encounter: Payer: Self-pay | Admitting: Family Medicine

## 2024-03-25 NOTE — Progress Notes (Signed)
 Subjective:    Patient ID: Julie Wilkerson, female    DOB: 1941-01-28, 83 y.o.   MRN: [id number]  Chief Complaint  Patient presents with   Follow-up    Pt states I have many problems with my stomach  I found out I have a problem in my colon and the pain is horrible  Leg pain onset 3 weeks ago pt was told by Cardiology it is due to the cholesterol medication     HPI Discussed the use of AI scribe software for clinical note transcription with the patient, who gave verbal consent to proceed.  History of Present Illness Julie Wilkerson is an 83 year old female who presents with abdominal pain and constipation.  She experiences ongoing abdominal pain, particularly in the left lower quadrant. The pain is severe, described as 'something stabbing here with a knife,' and can radiate down to her leg, affecting her ability to walk. The duration of this pain is unspecified, but it is severe enough to require ibuprofen  for relief. She has also tried hyoscyamine  and heat application with limited success.  She has a history of constipation, which she feels has slightly improved. However, she describes painful bowel movements, feeling as though the stool is 'stuck' and causing spasms. She has been using papaya as a natural remedy. No blood in stools is reported.  She is currently taking duloxetine  30 mg twice daily for pain and stress, which she started three weeks ago. Initially, she did not feel it was effective, but now she feels it is starting to work. She was previously on citalopram , which was reduced due to overlap with duloxetine . She experienced excessive thirst with a previous medication, which was changed, but she cannot recall the name.  She reports urinary incontinence, which has been ongoing for two years. She describes an urgent need to urinate and difficulty controlling the flow once it starts, leading to embarrassing situations. She has tried various treatments, including medication and  therapy, with limited success.    Past Medical History:  Diagnosis Date   Abdominal pain 11/07/2016   Accelerating angina (HCC) 02/09/2019   Acute diastolic CHF (congestive heart failure) (HCC) 02/16/2019   Acute on chronic diastolic CHF (congestive heart failure) (HCC) 02/23/2019   Acute pansinusitis 04/26/2017   Amblyopia of eye, left 04/19/2018   Anemia    Anginal pain    Anxiety    Arthritis    Asthma    Atypical chest pain 02/04/2019   Bilateral carotid bruits 03/28/2017   Blood transfusion without reported diagnosis    Breast cancer (HCC)    2002, left, encapsulated, microcalcifications. lumpectomy, radtiation x 30   Breast cancer in female [facility])    Bursitis of left hip 09/04/2018   Change in bowel habits 02/22/2018   Change in mole 06/08/2016   Chest pain 03/04/2020   CHF (congestive heart failure) (HCC)    Chronic bilateral thoracic back pain 05/23/2016   Chronic diastolic congestive heart failure (HCC) 05/28/2019   Colitis    Complication of anesthesia    VERY SENSITIVE TO ANESTHESIA    Constipation 01/17/2018   Coronary artery disease involving native coronary artery of native heart without angina pectoris 05/28/2019   Cortical age-related cataract of right eye 04/18/2018   Cough variant asthma  vs UACS from ACEi     Spirometry 12/02/2016  FEV1 1.26 (76%)  Ratio 78 with min curvature p last saba > 6 h prior  - trial off acei 12/02/2016  -  12/02/2016  After extensive coaching HFA effectiveness =    75% with qvar  autohaler > rechallenge with 80 2bid     Decreased visual acuity 01/16/2017   Degeneration of lumbar intervertebral disc 08/01/2018   Diarrhea 11/16/2015   D/o Diverticuli, polyps, celiac disease.     Dyspnea 11/04/2016   with asthma exacerbation only   Dysuria 11/04/2016   Elevated sed rate 10/23/2017   Essential hypertension    Trial off acei 12/02/2016 due to cough/ pseudoasthma   GERD (gastroesophageal reflux disease)    Gluten intolerance    History  of chicken pox    History of Helicobacter pylori infection 08/01/2015   History of rectal bleeding 06/01/2019   Hx of LASIK 04/24/2019   Hyperglycemia 11/07/2016   Hyperlipidemia    Hypertension    Hyponatremia 02/16/2019   Hypothyroidism    IBS (irritable bowel syndrome)    Lattice degeneration of right retina 04/24/2019   Left hand pain 05/07/2018   Left hip pain 03/01/2017   Left leg pain 03/01/2017   Left shoulder pain 05/07/2018   Low back pain 08/10/2015   LUQ pain 06/01/2019   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   Neck pain 03/01/2017   Nuclear sclerotic cataract of right eye 04/18/2018   Osteoporosis    Overweight (BMI 25.0-29.9) 02/14/2019   Pain in joint of left shoulder 08/11/2018   Paroxysmal atrial tachycardia 05/28/2019   Pelvic pain 07/02/2018   Personal history of radiation therapy    Pneumonia    PONV (postoperative nausea and vomiting)    Poor appetite 02/22/2018   Pseudophakia of left eye 04/18/2018   PVD (posterior vitreous detachment), right 04/24/2019   Restless sleeper 11/07/2016   Right hip pain 10/23/2017   RLQ discomfort 03/01/2017   RLS (restless legs syndrome) 05/07/2019   S/P left THA, AA 02/13/2019   Sepsis due to pneumonia (HCC) 02/16/2019   Sleep apnea    uses CPAP   Stomach cramps    Tremor 02/15/2016   Urinary frequency 12/22/2017   Urinary incontinence 05/07/2019   Urinary tract infection 11/04/2016   Vitamin D  deficiency 04/29/2016    Past Surgical History:  Procedure Laterality Date   ABDOMINAL HYSTERECTOMY     partial   APPENDECTOMY     BREAST LUMPECTOMY Left 2002   CHOLECYSTECTOMY  Age 9 or 41   COLONOSCOPY  2017   EYE SURGERY Left    cataract   LEFT HEART CATH AND CORONARY ANGIOGRAPHY N/A 02/09/2019   Procedure: LEFT HEART CATH AND CORONARY ANGIOGRAPHY;  Surgeon: Mady Bruckner, MD;  Location: [facility];  Service: Cardiovascular;  Laterality: N/A;   TONSILLECTOMY      TOTAL HIP ARTHROPLASTY Left 02/13/2019   Procedure: TOTAL HIP ARTHROPLASTY ANTERIOR APPROACH;  Surgeon: Ernie Cough, MD;  Location: [facility];  Service: Orthopedics;  Laterality: Left;  70 mins   UPPER GASTROINTESTINAL ENDOSCOPY      Family History  Problem Relation Age of Onset   Colitis Mother    Irritable bowel syndrome Mother    Hypertension Father    Hyperlipidemia Sister    Hyperlipidemia Brother    Heart attack Brother    Hyperlipidemia Brother    Heart attack Brother    Hyperlipidemia Brother    Heart attack Brother    Stomach cancer Paternal Grandmother 74   Colon cancer Paternal Grandmother    Leukemia Daughter    Arthritis Daughter    Heart disease Daughter  ASD vs VSD   Esophageal cancer Neg Hx    Pancreatic cancer Neg Hx    Rectal cancer Neg Hx     Social History   Socioeconomic History   Marital status: Single    Spouse name: Not on file   Number of children: 3   Years of education: 23   Highest education level: Bachelor's degree (e.g., BA, AB, BS)  Occupational History   Occupation: retired    Comment: runner, broadcasting/film/video, kindergarten  Tobacco Use   Smoking status: Never   Smokeless tobacco: Never  Vaping Use   Vaping status: Never Used  Substance and Sexual Activity   Alcohol use: Never    Alcohol/week: 0.0 standard drinks of alcohol   Drug use: Never   Sexual activity: Not Currently    Birth control/protection: None    Comment: lives alone, avoids daiiry and gluten. 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  Other Topics Concern   Not on file  Social History Narrative   Not on file   Social Drivers of Health   Financial Resource Strain: Low Risk  (10/18/2023)   Received from [facility]   Overall Financial Resource Strain (CARDIA)    Difficulty of Paying Living Expenses: Not hard at all  Food Insecurity: No Food Insecurity (10/18/2023)   Received from [facility]   Hunger Vital Sign    Within the past 12 months, you worried that your food would run  out before you got the money to buy more.: Never true    Within the past 12 months, the food you bought just didn't last and you didn't have money to get more.: Never true  Transportation Needs: No Transportation Needs (10/18/2023)   Received from [facility] - Transportation    Lack of Transportation (Medical): No    Lack of Transportation (Non-Medical): No  Physical Activity: Sufficiently Active (06/14/2023)   Exercise Vital Sign    Days of Exercise per Week: 3 days    Minutes of Exercise per Session: 60 min  Recent Concern: Physical Activity - Inactive (05/24/2023)   Exercise Vital Sign    Days of Exercise per Week: 0 days    Minutes of Exercise per Session: 20 min  Stress: No Stress Concern Present (06/14/2023)   [employer] - Occupational Stress Questionnaire    Feeling of Stress : Not at all  Social Connections: Socially Integrated (10/18/2023)   Received from [facility]   Social Network    How would you rate your social network (family, work, friends)?: Good participation with social networks  Intimate Partner Violence: Not At Risk (10/18/2023)   Received from [facility]   HITS    Over the last 12 months how often did your partner physically hurt you?: Never    Over the last 12 months how often did your partner insult you or talk down to you?: Never    Over the last 12 months how often did your partner threaten you with physical harm?: Never    Over the last 12 months how often did your partner scream or curse at you?: Never    Outpatient Medications Prior to Visit  Medication Sig Dispense Refill   DULoxetine  (CYMBALTA ) 30 MG capsule Take 30 mg by mouth daily.     estradiol  (ESTRACE ) 0.1 MG/GM vaginal cream Place 0.5 g vaginally 2 (two) times a week. Place 0.5g nightly for two weeks then twice a week after 42.5 g 11   fexofenadine  (ALLEGRA  ALLERGY ) 180 MG  tablet Take 1 tablet (180 mg total) by mouth daily as needed for allergies or  rhinitis.     Fluticasone  Furoate (ARNUITY ELLIPTA ) 100 MCG/ACT AEPB Inhale 1 puff into the lungs daily. 30 each 5   furosemide  (LASIX ) 20 MG tablet Take 1 tablet (20 mg total) by mouth daily. 90 tablet 1   hyoscyamine  (LEVSIN  SL) 0.125 MG SL tablet Dissolve 1 tablet under tongue at least twice daily 60 tablet 2   levothyroxine  (SYNTHROID ) 50 MCG tablet TAKE 1 TABLET BY MOUTH ONCE DAILY BEFORE BREAKFAST 90 tablet 1   lisinopril  (ZESTRIL ) 10 MG tablet Take 0.5 tablets (5 mg total) by mouth 2 (two) times daily.     Loteprednol Etabonate 0.5 % GEL Apply 1 drop to eye 4 (four) times daily.     Magnesium  Citrate (MAGNESIUM  GUMMIES PO) Take by mouth daily.     omega-3 acid ethyl esters (LOVAZA ) 1 g capsule Take 2 g by mouth daily.     omeprazole  (PRILOSEC) 40 MG capsule Take 1 capsule (40 mg total) by mouth 2 (two) times daily. 60 capsule 5   ondansetron  (ZOFRAN -ODT) 4 MG disintegrating tablet Take 4 mg by mouth every 8 (eight) hours as needed.     Peppermint Oil (IBGARD) 90 MG CPCR Use as directed     potassium chloride  (MICRO-K ) 10 MEQ CR capsule Take 1 capsule (10 mEq total) by mouth daily. 90 capsule 0   sucralfate  (CARAFATE ) 1 g tablet Take 1 tablet (1 g total) by mouth 2 (two) times daily. 180 tablet 1   valACYclovir  (VALTREX ) 1000 MG tablet Take 1 tablet (1,000 mg total) by mouth 2 (two) times daily. 14 tablet 0   Vitamin D , Ergocalciferol , (DRISDOL ) 1.25 MG (50000 UNIT) CAPS capsule Take 1 capsule (50,000 Units total) by mouth every 7 (seven) days. 5 capsule 4   XDEMVY 0.25 % SOLN Apply 1 drop to eye 2 (two) times daily.     citalopram  (CELEXA ) 20 MG tablet Take 1.5 tablets (30 mg total) by mouth at bedtime. 45 tablet 3   No facility-administered medications prior to visit.    Allergies  Allergen Reactions   Valium  [Diazepam ] Shortness Of Breath    Sob and bradycardia   Albuterol  And Levalbuterol  Palpitations    Episode of palpitations, tremor and anxiety when administered levalbuterol   for PFT. Recommend avoiding this medicaiton class   Rosuvastatin  Other (See Comments)    myalgias    Review of Systems  Constitutional:  Positive for malaise/fatigue. Negative for fever.  HENT:  Negative for congestion.   Eyes:  Negative for blurred vision.  Respiratory:  Negative for shortness of breath.   Cardiovascular:  Negative for chest pain, palpitations and leg swelling.  Gastrointestinal:  Positive for abdominal pain. Negative for blood in stool and nausea.  Genitourinary:  Positive for frequency and urgency. Negative for dysuria, flank pain and hematuria.  Musculoskeletal:  Positive for joint pain and myalgias. Negative for falls.  Skin:  Negative for rash.  Neurological:  Negative for dizziness, loss of consciousness and headaches.  Endo/Heme/Allergies:  Negative for environmental allergies.  Psychiatric/Behavioral:  Negative for depression. The patient is not nervous/anxious.        Objective:    Physical Exam Constitutional:      General: She is not in acute distress.    Appearance: Normal appearance. She is well-developed. She is not toxic-appearing.  HENT:     Head: Normocephalic and atraumatic.     Right Ear: External ear normal.  Left Ear: External ear normal.     Nose: Nose normal.  Eyes:     General:        Right eye: No discharge.        Left eye: No discharge.     Conjunctiva/sclera: Conjunctivae normal.  Neck:     Thyroid : No thyromegaly.  Cardiovascular:     Rate and Rhythm: Normal rate and regular rhythm.     Heart sounds: Normal heart sounds. No murmur heard. Pulmonary:     Effort: Pulmonary effort is normal. No respiratory distress.     Breath sounds: Normal breath sounds.  Abdominal:     General: Bowel sounds are normal.     Palpations: Abdomen is soft.     Tenderness: There is no abdominal tenderness. There is no guarding.  Musculoskeletal:        General: Normal range of motion.     Cervical back: Neck supple.  Lymphadenopathy:      Cervical: No cervical adenopathy.  Skin:    General: Skin is warm and dry.  Neurological:     Mental Status: She is alert and oriented to person, place, and time.  Psychiatric:        Mood and Affect: Mood normal.        Behavior: Behavior normal.        Thought Content: Thought content normal.        Judgment: Judgment normal.     BP (!) 140/72 (BP Location: Left Arm, Patient Position: Sitting, Cuff Size: Normal)   Pulse 71   Ht 4' 10 (1.473 m)   Wt 112 lb (50.8 kg)   SpO2 99%   BMI 23.41 kg/m  Wt Readings from Last 3 Encounters:  03/22/24 112 lb (50.8 kg)  03/20/24 111 lb 6.4 oz (50.5 kg)  02/02/24 110 lb (49.9 kg)    Diabetic Foot Exam - Simple   No data filed    Lab Results  Component Value Date   WBC 4.7 03/22/2024   HGB 11.0 (L) 03/22/2024   HCT 33.5 (L) 03/22/2024   PLT 194.0 03/22/2024   GLUCOSE 85 03/22/2024   CHOL 166 03/22/2024   TRIG 109.0 03/22/2024   HDL 75.90 03/22/2024   LDLCALC 68 03/22/2024   ALT 12 03/22/2024   AST 18 03/22/2024   NA 139 03/22/2024   K 4.5 03/22/2024   CL 104 03/22/2024   CREATININE 1.09 03/22/2024   BUN 35 (H) 03/22/2024   CO2 27 03/22/2024   TSH 1.62 03/22/2024   INR 1.1 07/21/2022   HGBA1C 6.2 03/22/2024    Lab Results  Component Value Date   TSH 1.62 03/22/2024   Lab Results  Component Value Date   WBC 4.7 03/22/2024   HGB 11.0 (L) 03/22/2024   HCT 33.5 (L) 03/22/2024   MCV 89.5 03/22/2024   PLT 194.0 03/22/2024   Lab Results  Component Value Date   NA 139 03/22/2024   K 4.5 03/22/2024   CO2 27 03/22/2024   GLUCOSE 85 03/22/2024   BUN 35 (H) 03/22/2024   CREATININE 1.09 03/22/2024   BILITOT 0.6 03/22/2024   ALKPHOS 69 03/22/2024   AST 18 03/22/2024   ALT 12 03/22/2024   PROT 6.8 03/22/2024   ALBUMIN  4.3 03/22/2024   CALCIUM  8.8 03/22/2024   ANIONGAP 14 08/18/2023   GFR 47.37 (L) 03/22/2024   Lab Results  Component Value Date   CHOL 166 03/22/2024   Lab Results  Component Value Date   HDL  75.90 03/22/2024   Lab Results  Component Value Date   LDLCALC 68 03/22/2024   Lab Results  Component Value Date   TRIG 109.0 03/22/2024   Lab Results  Component Value Date   CHOLHDL 2 03/22/2024   Lab Results  Component Value Date   HGBA1C 6.2 03/22/2024       Assessment & Plan:  Anxiety Assessment & Plan: Cymbalta  60 mg daily.   Chronic diastolic congestive heart failure (HCC) Assessment & Plan: No recent exacerbation   Essential hypertension Assessment & Plan: Well controlled, no changes to meds. Encouraged heart healthy diet such as the DASH diet and exercise as tolerated.    Orders: -     Comprehensive metabolic panel with GFR -     CBC with Differential/Platelet  Hyperglycemia Assessment & Plan: hgba1c acceptable, minimize simple carbs. Increase exercise as tolerated.   Orders: -     Hemoglobin A1c  Mixed hyperlipidemia Assessment & Plan: Encourage heart healthy diet such as MIND or DASH diet, increase exercise, avoid trans fats, simple carbohydrates and processed foods, consider a krill or fish or flaxseed oil cap daily.   Orders: -     Lipid panel  Hypothyroidism, unspecified type Assessment & Plan: On Levothyroxine , continue to monitor  Orders: -     TSH  Mild intermittent asthma, unspecified whether complicated Assessment & Plan: No recent exacerbation   Vitamin D  deficiency Assessment & Plan: Supplement and monitor   Orders: -     VITAMIN D  25 Hydroxy (Vit-D Deficiency, Fractures)  Anemia, unspecified type -     Iron , TIBC and Ferritin Panel  Statin myopathy Assessment & Plan: Had severe leg pains on Rosuvastatin  improving discontinuation, is following with cardiology   Need for influenza vaccination -     Flu vaccine HIGH DOSE PF(Fluzone Trivalent)  Need for pneumococcal vaccination -     Pneumococcal conjugate vaccine 20-valent  Other orders -     Citalopram  Hydrobromide; Take 1 tablet (20 mg total) by mouth at  bedtime.  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 Pain is severe, stabbing, radiates to the leg, and causes difficulty walking. No blood in stools, but painful bowel movements. Previous treatments include duloxetine , ibuprofen , hyoscyamine , and heat application. Duloxetine  at 30 mg twice daily provides some improvement. Hyoscyamine  suggested before bowel movements to relax muscle spasms. Posture adjustment during bowel movements recommended to facilitate passage. - Continue duloxetine  30 mg twice daily. - Reduce citalopram  to 20 mg daily for one month, then decrease to 10 mg daily if pain and anxiety do not worsen. - Use hyoscyamine  sublingually before bowel movements to relax muscle spasms. - Maintain hydration and fiber intake to manage constipation. - Consider papaya or papaya enzyme supplements for digestive health.  Urinary incontinence Chronic urinary incontinence with urgency and inability to control urination, leading to accidents. Previous treatments include medications and therapy with limited success. Referral to urogynecology in the past, but no recent follow-up. Discussed the use of NYX underwear for incontinence management. - Consider trying special absorben underwear for incontinence management. - Contact urogynecology for a follow-up appointment if symptoms worsen or for reassurance.  Statin-induced myopathy (rosuvastatin ) Severe leg pain attributed to rosuvastatin  use, resolved upon discontinuation. Pain may have contributed to abdominal discomfort. - Document rosuvastatin  as an allergy  due to statin myopathy. - Avoid rosuvastatin   and consider alternative cholesterol-lowering therapies with cardiology consultation.  Mixed hyperlipidemia High cholesterol levels with  previous intolerance to rosuvastatin  due to myopathy. Awaiting further management plan from cardiology, including potential alternative medications. - Consult with cardiology for alternative cholesterol-lowering medications. - Consider injectable cholesterol-lowering therapies if appropriate.  Anxiety and depression Anxiety and depression managed with citalopram  and duloxetine . Transitioning to duloxetine  as primary treatment with gradual reduction of citalopram  to avoid overlap and potential side effects. - Continue duloxetine  30 mg twice daily. - Reduce citalopram  to 20 mg daily for one month, then decrease to 10 mg daily if symptoms do not worsen. - Monitor for worsening of anxiety or depression during medication adjustment.  Anemia Anemia with need for monitoring. Recent blood work to assess current status and potential causes, including iron  deficiency. - Order blood work including iron  panel to assess anemia status.  Recording duration: 32 minutes     Harlene Horton, MD

## 2024-03-27 ENCOUNTER — Other Ambulatory Visit: Payer: Self-pay | Admitting: Medical

## 2024-03-29 ENCOUNTER — Ambulatory Visit (HOSPITAL_COMMUNITY): Admission: RE | Admit: 2024-03-29 | Source: Ambulatory Visit

## 2024-03-29 ENCOUNTER — Ambulatory Visit (HOSPITAL_COMMUNITY): Attending: Physician Assistant

## 2024-03-29 ENCOUNTER — Ambulatory Visit: Admitting: Family Medicine

## 2024-03-31 ENCOUNTER — Encounter: Payer: Self-pay | Admitting: Family Medicine

## 2024-04-02 MED ORDER — LISINOPRIL 10 MG PO TABS: 5.0000 mg | ORAL_TABLET | Freq: Two times a day (BID) | ORAL | 1 refills | Status: AC

## 2024-04-03 ENCOUNTER — Telehealth: Payer: Self-pay | Admitting: Cardiology

## 2024-04-03 NOTE — Telephone Encounter (Signed)
 Returned patient's call, she is away from home and cannot remember what medication she had a question about. She states she will call us  when she gets home. Asked if she was calling about lovaza , which is the only lipid lowering med on current med list, patient states she is not sure. It appears she has been on crestor  in the past.

## 2024-04-03 NOTE — Telephone Encounter (Signed)
 Pt requesting c/b in regards to being prescribed cholesterol medication that does not have side effects. Please advise

## 2024-04-05 ENCOUNTER — Encounter (HOSPITAL_COMMUNITY): Payer: Self-pay

## 2024-04-05 ENCOUNTER — Ambulatory Visit (HOSPITAL_COMMUNITY)
Admission: RE | Admit: 2024-04-05 | Discharge: 2024-04-05 | Disposition: A | Source: Ambulatory Visit | Attending: Physician Assistant

## 2024-04-05 ENCOUNTER — Telehealth: Payer: Self-pay | Admitting: Physician Assistant

## 2024-04-05 ENCOUNTER — Other Ambulatory Visit (HOSPITAL_COMMUNITY): Payer: Self-pay

## 2024-04-05 ENCOUNTER — Ambulatory Visit: Payer: Self-pay | Admitting: Physician Assistant

## 2024-04-05 ENCOUNTER — Ambulatory Visit (HOSPITAL_COMMUNITY)
Admission: RE | Admit: 2024-04-05 | Discharge: 2024-04-05 | Disposition: A | Discharge status: Home / Self Care | Source: Ambulatory Visit | Attending: Physician Assistant | Admitting: Physician Assistant

## 2024-04-05 DIAGNOSIS — I739 Peripheral vascular disease, unspecified: Secondary | ICD-10-CM | POA: Insufficient documentation

## 2024-04-05 DIAGNOSIS — R7303 Prediabetes: Secondary | ICD-10-CM

## 2024-04-05 DIAGNOSIS — E782 Mixed hyperlipidemia: Secondary | ICD-10-CM

## 2024-04-05 MED ORDER — ATORVASTATIN CALCIUM 20 MG PO TABS
20.0000 mg | ORAL_TABLET | Freq: Every day | ORAL | 6 refills | Status: AC
  Filled 2024-04-05: qty 30, 30d supply, fill #0

## 2024-04-05 NOTE — Telephone Encounter (Signed)
 Pt reported to OP pharmacy for different statin - side effects from crestor  seemed to improve off of medications. She is awaiting lipid clinic pharmD appt. In the meantime we can trial 20 mg lipitor.   Jon Nat Hails, PA-C 04/05/2024, 3:18 PM [phone] [facility] [address] [place], [place] [address]

## 2024-04-08 ENCOUNTER — Ambulatory Visit: Payer: Self-pay | Admitting: Nurse Practitioner

## 2024-04-08 ENCOUNTER — Ambulatory Visit (INDEPENDENT_AMBULATORY_CARE_PROVIDER_SITE_OTHER)

## 2024-04-08 ENCOUNTER — Ambulatory Visit
Admission: EM | Admit: 2024-04-08 | Discharge: 2024-04-08 | Disposition: A | Discharge status: Home / Self Care | Attending: Nurse Practitioner | Admitting: Nurse Practitioner

## 2024-04-08 ENCOUNTER — Other Ambulatory Visit: Payer: Self-pay

## 2024-04-08 DIAGNOSIS — M79621 Pain in right upper arm: Secondary | ICD-10-CM | POA: Diagnosis not present

## 2024-04-08 DIAGNOSIS — M19011 Primary osteoarthritis, right shoulder: Secondary | ICD-10-CM | POA: Diagnosis not present

## 2024-04-08 DIAGNOSIS — S46211A Strain of muscle, fascia and tendon of other parts of biceps, right arm, initial encounter: Secondary | ICD-10-CM

## 2024-04-08 NOTE — ED Provider Notes (Signed)
 [facility]    CSN: [id number] Arrival date & time: 04/08/24  1436      History   Chief Complaint No chief complaint on file.   HPI Julie Wilkerson is a 83 y.o. female presents for arm pain.  Patient reports last night she lifted a heater and heard a crack in her mid upper arm and has since had pain that is worse with extension.  She states pain is radiating up into her shoulder.  Denies any swelling, bruising or numbness or tingling.  Denies decreased strength.  No history of injuries or surgeries to the area.  She has been doing ice for symptoms.  No other concerns at this time.  HPI  Past Medical History:  Diagnosis Date   Abdominal pain 11/07/2016   Accelerating angina (HCC) 02/09/2019   Acute diastolic CHF (congestive heart failure) (HCC) 02/16/2019   Acute on chronic diastolic CHF (congestive heart failure) (HCC) 02/23/2019   Acute pansinusitis 04/26/2017   Amblyopia of eye, left 04/19/2018   Anemia    Anginal pain    Anxiety    Arthritis    Asthma    Atypical chest pain 02/04/2019   Bilateral carotid bruits 03/28/2017   Blood transfusion without reported diagnosis    Breast cancer (HCC)    2002, left, encapsulated, microcalcifications. lumpectomy, radtiation x 30   Breast cancer in female [facility])    Bursitis of left hip 09/04/2018   Change in bowel habits 02/22/2018   Change in mole 06/08/2016   Chest pain 03/04/2020   CHF (congestive heart failure) (HCC)    Chronic bilateral thoracic back pain 05/23/2016   Chronic diastolic congestive heart failure (HCC) 05/28/2019   Colitis    Complication of anesthesia    VERY SENSITIVE TO ANESTHESIA    Constipation 01/17/2018   Coronary artery disease involving native coronary artery of native heart without angina pectoris 05/28/2019   Cortical age-related cataract of right eye 04/18/2018   Cough variant asthma  vs UACS from ACEi     Spirometry 12/02/2016  FEV1 1.26 (76%)  Ratio 78 with min curvature p last saba >  6 h prior  - trial off acei 12/02/2016  - 12/02/2016  After extensive coaching HFA effectiveness =    75% with qvar  autohaler > rechallenge with 80 2bid     Decreased visual acuity 01/16/2017   Degeneration of lumbar intervertebral disc 08/01/2018   Diarrhea 11/16/2015   D/o Diverticuli, polyps, celiac disease.     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   Left hand pain 05/07/2018   Left hip pain 03/01/2017   Left leg pain 03/01/2017   Left shoulder pain 05/07/2018   Low back pain 08/10/2015   LUQ pain 06/01/2019   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   Neck pain 03/01/2017   Nuclear sclerotic cataract of right eye 04/18/2018  Osteoporosis    Overweight (BMI 25.0-29.9) 02/14/2019   Pain in joint of left shoulder 08/11/2018   Paroxysmal atrial tachycardia 05/28/2019   Pelvic pain 07/02/2018   Personal history of radiation therapy    Pneumonia    PONV (postoperative nausea and vomiting)    Poor appetite 02/22/2018   Pseudophakia of left eye 04/18/2018   PVD (posterior vitreous detachment), right 04/24/2019   Restless sleeper 11/07/2016   Right hip pain 10/23/2017   RLQ discomfort 03/01/2017   RLS (restless legs syndrome) 05/07/2019   S/P left THA, AA 02/13/2019   Sepsis due to pneumonia (HCC) 02/16/2019   Sleep apnea    uses CPAP   Stomach cramps    Tremor 02/15/2016   Urinary frequency  12/22/2017   Urinary incontinence 05/07/2019   Urinary tract infection 11/04/2016   Vitamin D  deficiency 04/29/2016    Patient Active Problem List   Diagnosis Date Noted   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   Prediabetes 12/14/2023   Myalgia 10/29/2023   COVID-19 12/08/2022   Breast pain, left 07/30/2022   Diplopia 07/30/2022   Otalgia of left ear 05/05/2022   Urge incontinence 04/28/2022   History of UTI 04/28/2022   Post-menopausal atrophic vaginitis 04/28/2022   Eye pain, left 03/25/2022   Visual changes 03/25/2022   Urinary urgency 03/25/2022   Mild intermittent asthma 08/25/2021   Pseudoaneurysm following procedure 08/25/2021   Sun-damaged skin 07/06/2021   Sinusitis 12/31/2020   Gastroesophageal reflux disease with hiatal hernia 12/31/2020   Dark stools 09/15/2020   Back pain, chronic 06/24/2020   Sleep apnea 06/24/2020   Stomach cramps    PONV (postoperative nausea and vomiting)    Personal history of radiation therapy    Complication of anesthesia    Colitis    Malignant neoplasm of female breast (HCC)    Anxiety    Bilateral hand pain    Dysuria 02/04/2020   History of rectal bleeding 06/01/2019   LUQ pain 06/01/2019   Chronic diastolic congestive heart failure (HCC) 05/28/2019   Paroxysmal atrial tachycardia 05/28/2019   Coronary artery disease involving native coronary artery of native heart without angina pectoris 05/28/2019   Urinary incontinence 05/07/2019   RLS (restless legs syndrome) 05/07/2019   Hx of LASIK 04/24/2019   Lattice degeneration of right retina 04/24/2019   PVD (posterior vitreous detachment), right 04/24/2019   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   Hyponatremia 02/16/2019   Overweight (BMI 25.0-29.9) 02/14/2019   History of repair of hip joint 02/13/2019   Accelerating angina (HCC) 02/09/2019   Preinfarction syndrome (HCC) 02/09/2019   Atypical chest pain 02/04/2019   Bursitis of left hip 09/04/2018   Pain in joint of left  shoulder 08/11/2018   Degeneration of lumbar intervertebral disc 08/01/2018   Pelvic pain 07/02/2018   Left shoulder pain 05/07/2018   Left hand pain 05/07/2018   Pain of left hand 05/07/2018   Amblyopia of eye, left 04/19/2018   Cortical age-related cataract of right eye 04/18/2018   Nuclear sclerotic cataract of right eye 04/18/2018   Pseudophakia of left eye 04/18/2018   Change in bowel habits 02/22/2018   Nausea and vomiting 02/22/2018   Decrease in appetite 02/22/2018   Anemia 01/17/2018   Constipation 01/17/2018   Increased frequency of urination 12/22/2017   Right hip pain 10/23/2017   Elevated sed rate 10/23/2017   Acute pansinusitis 04/26/2017   Bronchitis 04/26/2017   Carotid bruit 03/28/2017   Nonintractable headache 03/28/2017   Amnesia 03/28/2017   Pain  in pelvis 03/01/2017   Neck pain on left side 03/01/2017   Right lower quadrant pain 03/01/2017   Left leg pain 03/01/2017   Neck pain 03/01/2017   Reduced visual acuity 01/16/2017   Attention deficit hyperactivity disorder, 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   Hyperglycemia 11/07/2016   Restless sleeper 11/07/2016   Abdominal pain 11/07/2016   Urinary tract infectious disease 11/04/2016   Dyspnea 11/04/2016   Change in mole 06/08/2016   Chronic thoracic back pain 05/23/2016   Vitamin D  deficiency 04/29/2016   Tremor 02/15/2016   Severe diarrhea 11/16/2015   Low back pain 08/10/2015   History of Helicobacter pylori infection 08/01/2015   Cough variant asthma  vs UACS from ACEi     Breast cancer in female [facility])    Hyperlipidemia    Essential hypertension    Osteoporosis    Hypothyroid    Irritable bowel disease    Gluten intolerance    History of chickenpox     Past Surgical History:  Procedure Laterality Date   ABDOMINAL HYSTERECTOMY     partial   APPENDECTOMY     BREAST LUMPECTOMY Left 2002   CHOLECYSTECTOMY  Age 12 or 41   COLONOSCOPY  2017   EYE SURGERY  Left    cataract   LEFT HEART CATH AND CORONARY ANGIOGRAPHY N/A 02/09/2019   Procedure: LEFT HEART CATH AND CORONARY ANGIOGRAPHY;  Surgeon: Mady Bruckner, MD;  Location: [facility];  Service: Cardiovascular;  Laterality: N/A;   TONSILLECTOMY     TOTAL HIP ARTHROPLASTY Left 02/13/2019   Procedure: TOTAL HIP ARTHROPLASTY ANTERIOR APPROACH;  Surgeon: Ernie Cough, MD;  Location: [facility];  Service: Orthopedics;  Laterality: Left;  70 mins   UPPER GASTROINTESTINAL ENDOSCOPY      OB History     Gravida  4   Para  3   Term  3   Preterm      AB  1   Living  3      SAB  1   IAB      Ectopic      Multiple      Live Births  3            Home Medications    Prior to Admission medications   Medication Sig Start Date End Date Taking? Authorizing Provider  atorvastatin  (LIPITOR) 20 MG tablet Take 1 tablet (20 mg total) by mouth daily. 04/05/24 07/04/24  Madie Jon Garre, PA  citalopram  (CELEXA ) 20 MG tablet Take 1 tablet (20 mg total) by mouth at bedtime. 03/22/24   Domenica Harlene LABOR, MD  DULoxetine  (CYMBALTA ) 30 MG capsule Take 30 mg by mouth daily.    [provider]  estradiol  (ESTRACE ) 0.1 MG/GM vaginal cream Place 0.5 g vaginally 2 (two) times a week. Place 0.5g nightly for two weeks then twice a week after 08/08/23   Zuleta, Kaitlin G, NP  fexofenadine  (ALLEGRA  ALLERGY ) 180 MG tablet Take 1 tablet (180 mg total) by mouth daily as needed for allergies or rhinitis. 12/15/21   Domenica Harlene LABOR, MD  Fluticasone  Furoate (ARNUITY ELLIPTA ) 100 MCG/ACT AEPB Inhale 1 puff into the lungs daily. 12/07/23   Hope Almarie ORN, NP  furosemide  (LASIX ) 20 MG tablet Take 1 tablet (20 mg total) by mouth daily. 10/27/23   Domenica Harlene LABOR, MD  hyoscyamine  (LEVSIN  SL) 0.125 MG SL tablet Dissolve 1 tablet under tongue at least twice daily 05/30/23   Armbruster, Elspeth SQUIBB, MD  levothyroxine  (SYNTHROID ) 50 MCG tablet TAKE 1 TABLET BY MOUTH ONCE DAILY BEFORE BREAKFAST 01/02/24   Domenica Harlene LABOR, MD  lisinopril  (ZESTRIL ) 10 MG tablet Take 0.5 tablets (5 mg total) by mouth 2 (two) times daily. 04/02/24   Domenica Harlene LABOR, MD  Loteprednol Etabonate 0.5 % GEL Apply 1 drop to eye 4 (four) times daily. 03/12/24   [provider]  Magnesium  Citrate (MAGNESIUM  GUMMIES PO) Take by mouth daily.    [provider]  omega-3 acid ethyl esters (LOVAZA ) 1 g capsule Take 2 g by mouth daily.    [provider]  omeprazole  (PRILOSEC) 40 MG capsule Take 1 capsule (40 mg total) by mouth 2 (two) times daily. 01/11/24   Domenica Harlene LABOR, MD  ondansetron  (ZOFRAN -ODT) 4 MG disintegrating tablet Take 4 mg by mouth every 8 (eight) hours as needed.    [provider]  Peppermint Oil (IBGARD) 90 MG CPCR Use as directed 06/30/22   Armbruster, Elspeth SQUIBB, MD  potassium chloride  (MICRO-K ) 10 MEQ CR capsule Take 1 capsule (10 mEq total) by mouth daily. 03/01/24   Domenica Harlene LABOR, MD  sucralfate  (CARAFATE ) 1 g tablet Take 1 tablet by mouth twice daily 03/28/24   Saguier, [place], PA-C  valACYclovir  (VALTREX ) 1000 MG tablet Take 1 tablet (1,000 mg total) by mouth 2 (two) times daily. 06/06/23   Copland, Harlene BROCKS, MD  Vitamin D , Ergocalciferol , (DRISDOL ) 1.25 MG (50000 UNIT) CAPS capsule Take 1 capsule (50,000 Units total) by mouth every 7 (seven) days. 07/27/22   Domenica Harlene LABOR, MD  XDEMVY 0.25 % SOLN Apply 1 drop to eye 2 (two) times daily. 03/14/24   [provider]    Family History Family History  Problem Relation Age of Onset   Colitis Mother    Irritable bowel syndrome Mother    Hypertension Father    Hyperlipidemia Sister    Hyperlipidemia Brother    Heart attack Brother    Hyperlipidemia Brother    Heart attack Brother    Hyperlipidemia Brother    Heart attack Brother    Stomach cancer Paternal Grandmother 42   Colon cancer Paternal Grandmother    Leukemia Daughter    Arthritis Daughter    Heart disease Daughter        ASD vs VSD   Esophageal cancer Neg Hx     Pancreatic cancer Neg Hx    Rectal cancer Neg Hx     Social History Social History   Tobacco Use   Smoking status: Never   Smokeless tobacco: Never  Vaping Use   Vaping status: Never Used  Substance Use Topics   Alcohol use: Never    Alcohol/week: 0.0 standard drinks of alcohol   Drug use: Never     Allergies   Valium  [diazepam ], Albuterol  and levalbuterol , and Rosuvastatin    Review of Systems Review of Systems  Musculoskeletal:        Right upper arm pain     Physical Exam Triage Vital Signs ED Triage Vitals  Encounter Vitals Group     BP 04/08/24 1523 119/74     Girls Systolic BP Percentile --      Girls Diastolic BP Percentile --      Boys Systolic BP Percentile --      Boys Diastolic BP Percentile --      Pulse Rate 04/08/24 1523 70     Resp 04/08/24 1523 16     Temp 04/08/24 1523 97.6 F (36.4 C)  Temp Source 04/08/24 1523 Oral     SpO2 04/08/24 1523 92 %     Weight --      Height --      Head Circumference --      Peak Flow --      Pain Score 04/08/24 1521 7     Pain Loc --      Pain Education --      Exclude from Growth Chart --    No data found.  Updated Vital Signs BP 119/74   Pulse 70   Temp 97.6 F (36.4 C) (Oral)   Resp 16   SpO2 92%   Visual Acuity Right Eye Distance:   Left Eye Distance:   Bilateral Distance:    Right Eye Near:   Left Eye Near:    Bilateral Near:     Physical Exam Vitals and nursing note reviewed.  Constitutional:      General: She is not in acute distress.    Appearance: Normal appearance. She is not ill-appearing.  HENT:     Head: Normocephalic and atraumatic.  Eyes:     Pupils: Pupils are equal, round, and reactive to light.  Cardiovascular:     Rate and Rhythm: Normal rate.  Pulmonary:     Effort: Pulmonary effort is normal.  Musculoskeletal:       Arms:     Comments: There is no swelling, ecchymosis or erythema of the right upper arm.  Tender to the mid bicep that radiates into the  shoulder.  Able to fully extend and flex at the elbow with minimal pain.  No Popeye arm.  Skin:    General: Skin is warm and dry.  Neurological:     General: No focal deficit present.     Mental Status: She is alert and oriented to person, place, and time.  Psychiatric:        Mood and Affect: Mood normal.        Behavior: Behavior normal.      UC Treatments / Results  Labs (all labs ordered are listed, but only abnormal results are displayed) Labs Reviewed - No data to display  EKG   Radiology No results found.  Procedures Procedures (including critical care time)  Medications Ordered in UC Medications - No data to display  Initial Impression / Assessment and Plan / UC Course  I have reviewed the triage vital signs and the nursing notes.  Pertinent labs & imaging results that were available during my care of the patient were reviewed by me and considered in my medical decision making (see chart for details).     Reviewed exam and symptoms with patient and daughter.  Wet read of x-ray without obvious fracture, will contact for any positive results based on radiology overread once available.  Discussed likely bicep strain from lifting, reviewed RICE therapy and patient to take OTC Tylenol  as needed.  PCP follow-up if symptoms do not improve.  ER precautions reviewed. Final Clinical Impressions(s) / UC Diagnoses   Final diagnoses:  Pain in right upper arm  Strain of right biceps muscle, initial encounter     Discharge Instructions      Your x-ray is negative for fracture.  You may elevate and ice your upper arm as needed.  Take Tylenol  as needed for pain and follow-up with your PCP if your symptoms do not improve.  Please go to the ER for any worsening symptoms.  I hope you feel better soon!  ED Prescriptions   None    PDMP not reviewed this encounter.   Loreda Myla SAUNDERS, NP 04/08/24 1600

## 2024-04-08 NOTE — ED Triage Notes (Signed)
 Pt states she was trying to lift a heater from the bottom and heard a crack in her right upper arm. Pt has pain in right upper arm. Pt has 2+ right radial pulse, cap refill less than 3 sec, warm to touch, 2/5 right grip strength

## 2024-04-08 NOTE — Discharge Instructions (Signed)
 Your x-ray is negative for fracture.  You may elevate and ice your upper arm as needed.  Take Tylenol  as needed for pain and follow-up with your PCP if your symptoms do not improve.  Please go to the ER for any worsening symptoms.  I hope you feel better soon!

## 2024-04-25 ENCOUNTER — Telehealth: Payer: Self-pay | Admitting: Cardiology

## 2024-04-25 NOTE — Telephone Encounter (Signed)
 called patient and pt requested and pt requested appt. With Dr. Elmira be cancelled for 12/4. Pt requested appt with Jon Hails, PA. Made pt aware that this will be forward to scheduling  for appt. Understanding verbalized

## 2024-04-25 NOTE — Telephone Encounter (Signed)
 Patient would like to know if there is anyway she can come in later than 9:30 for tomorrow appt 12/4. Please advise.

## 2024-04-26 ENCOUNTER — Ambulatory Visit: Admitting: Cardiology

## 2024-05-01 DIAGNOSIS — H04123 Dry eye syndrome of bilateral lacrimal glands: Secondary | ICD-10-CM | POA: Diagnosis not present

## 2024-05-21 ENCOUNTER — Encounter: Payer: Self-pay | Admitting: Family

## 2024-05-21 ENCOUNTER — Ambulatory Visit: Payer: Self-pay

## 2024-05-21 ENCOUNTER — Ambulatory Visit: Admitting: Family

## 2024-05-21 VITALS — BP 118/64 | HR 83 | Temp 97.8°F | Resp 14 | Ht <= 58 in | Wt 112.0 lb

## 2024-05-21 DIAGNOSIS — R051 Acute cough: Secondary | ICD-10-CM | POA: Diagnosis not present

## 2024-05-21 DIAGNOSIS — R6889 Other general symptoms and signs: Secondary | ICD-10-CM | POA: Diagnosis not present

## 2024-05-21 DIAGNOSIS — J029 Acute pharyngitis, unspecified: Secondary | ICD-10-CM | POA: Diagnosis not present

## 2024-05-21 DIAGNOSIS — J069 Acute upper respiratory infection, unspecified: Secondary | ICD-10-CM | POA: Diagnosis not present

## 2024-05-21 LAB — POCT INFLUENZA A/B
Influenza A, POC: NEGATIVE
Influenza B, POC: NEGATIVE

## 2024-05-21 LAB — POC COVID19 BINAXNOW: SARS Coronavirus 2 Ag: NEGATIVE

## 2024-05-21 MED ORDER — METHYLPREDNISOLONE 4 MG PO TBPK: ORAL_TABLET | ORAL | 0 refills | Status: AC

## 2024-05-21 MED ORDER — BENZONATATE 200 MG PO CAPS: 200.0000 mg | ORAL_CAPSULE | Freq: Three times a day (TID) | ORAL | 0 refills | Status: AC | PRN

## 2024-05-21 NOTE — Telephone Encounter (Signed)
 Clinical - Red Word Triage  FYI Only or Action Required?: FYI only for provider: appointment scheduled on 05/21/24.  Patient was last seen in primary care on 03/22/2024 by Domenica Harlene LABOR, MD.  Called Nurse Triage reporting Sore Throat and Cough.  Symptoms began several days ago.  Interventions attempted: OTC medications: Unsure of name and Rest, hydration, or home remedies.  Symptoms are: gradually worsening.  Triage Disposition: See HCP Within 4 Hours (Or PCP Triage)  Patient/caregiver understands and will follow disposition?:  Reason for Disposition  Wheezing is present  Answer Assessment - Initial Assessment Questions Patient taking cough/ congestion medication OTC but does not know the name.   1. ONSET: When did the cough begin?      3 days ago  2. SEVERITY: How bad is the cough today?      Worsening, unable to sleep last night  3. SPUTUM: Describe the color of your sputum (e.g., none, dry cough; clear, white, yellow, green)     Greenish  4. DIFFICULTY BREATHING: Are you having difficulty breathing? If Yes, ask: How bad is it? (e.g., mild, moderate, severe)      Denies  5. FEVER: Do you have a fever? If Yes, ask: What is your temperature, how was it measured, and when did it start?     Unable to assess, stated knew she had one because was getting chills and feeling hot  6. CARDIAC HISTORY: Do you have any history of heart disease? (e.g., heart attack, congestive heart failure)      Denies  7. LUNG HISTORY: Do you have any history of lung disease?  (e.g., pulmonary embolus, asthma, emphysema)     Asthma  8. OTHER SYMPTOMS: Do you have any other symptoms? (e.g., runny nose, wheezing, chest pain)       Nasal congestion, mouth breathing, bilateral ear pain, sore throat, chest congestion, mild wheezing  9. TRAVEL: Have you traveled out of the country in the last month? (e.g., travel history, exposures)       Denies  Protocols used: Cough - Acute  Productive-A-AH  Copied from CRM [id number]. Topic: Clinical - Red Word Triage >> May 21, 2024  8:08 AM Donna BRAVO wrote: Red Word that prompted transfer to Nurse Triage:  -sick 3 days -Patient has asthma  -breathing getting worse -getting worse -ears, throat -cough  -rattling in chest when breathing -short of breath >> May 21, 2024  8:15 AM Donna BRAVO wrote: Call dropped, called Patient back no answer, left message

## 2024-05-21 NOTE — Progress Notes (Signed)
 "  Acute Office Visit  Subjective:     Patient ID: Julie Wilkerson, female    DOB: 09-02-1940, 83 y.o.   MRN: [id number]  Chief Complaint  Patient presents with   Cough    Patient is here for ear pain, sore throat, body aches and pain, febrile and little sleep for 3 days    HPI Patient is in today with complaints of fever, aching, congestion, ear pain and sore throat x 3 days.  She has been taking Tylenol  that does help and Coricidin.  Symptoms appear to be worse last night..  Reports having a granddaughter with the flu and her daughter who has a cold.  Review of Systems  Constitutional:  Positive for chills and fever.  HENT:  Positive for congestion and sore throat.   Respiratory:  Positive for cough.   Cardiovascular: Negative.   Gastrointestinal:  Negative for nausea and vomiting.  Musculoskeletal:  Positive for myalgias.  Neurological: Negative.   Psychiatric/Behavioral: Negative.      Past Medical History:  Diagnosis Date   Abdominal pain 11/07/2016   Accelerating angina (HCC) 02/09/2019   Acute diastolic CHF (congestive heart failure) (HCC) 02/16/2019   Acute on chronic diastolic CHF (congestive heart failure) (HCC) 02/23/2019   Acute pansinusitis 04/26/2017   Amblyopia of eye, left 04/19/2018   Anemia    Anginal pain    Anxiety    Arthritis    Asthma    Atypical chest pain 02/04/2019   Bilateral carotid bruits 03/28/2017   Blood transfusion without reported diagnosis    Breast cancer (HCC)    2002, left, encapsulated, microcalcifications. lumpectomy, radtiation x 30   Breast cancer in female [facility])    Bursitis of left hip 09/04/2018   Change in bowel habits 02/22/2018   Change in mole 06/08/2016   Chest pain 03/04/2020   CHF (congestive heart failure) (HCC)    Chronic bilateral thoracic back pain 05/23/2016   Chronic diastolic congestive heart failure (HCC) 05/28/2019   Colitis    Complication of anesthesia    VERY SENSITIVE TO  ANESTHESIA    Constipation 01/17/2018   Coronary artery disease involving native coronary artery of native heart without angina pectoris 05/28/2019   Cortical age-related cataract of right eye 04/18/2018   Cough variant asthma  vs UACS from ACEi     Spirometry 12/02/2016  FEV1 1.26 (76%)  Ratio 78 with min curvature p last saba > 6 h prior  - trial off acei 12/02/2016  - 12/02/2016  After extensive coaching HFA effectiveness =    75% with qvar  autohaler > rechallenge with 80 2bid     Decreased visual acuity 01/16/2017   Degeneration of lumbar intervertebral disc 08/01/2018   Diarrhea 11/16/2015   D/o Diverticuli, polyps, celiac disease.     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   Left hand pain 05/07/2018   Left hip pain 03/01/2017  Left leg pain 03/01/2017   Left shoulder pain 05/07/2018   Low back pain 08/10/2015   LUQ pain 06/01/2019   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   Neck pain 03/01/2017   Nuclear sclerotic cataract of right eye 04/18/2018   Osteoporosis    Overweight (BMI 25.0-29.9) 02/14/2019   Pain in joint of left shoulder 08/11/2018   Paroxysmal atrial tachycardia 05/28/2019   Pelvic pain 07/02/2018   Personal history of radiation therapy    Pneumonia    PONV (postoperative nausea and vomiting)    Poor appetite 02/22/2018   Pseudophakia  of left eye 04/18/2018   PVD (posterior vitreous detachment), right 04/24/2019   Restless sleeper 11/07/2016   Right hip pain 10/23/2017   RLQ discomfort 03/01/2017   RLS (restless legs syndrome) 05/07/2019   S/P left THA, AA 02/13/2019   Sepsis due to pneumonia (HCC) 02/16/2019   Sleep apnea    uses CPAP   Stomach cramps    Tremor 02/15/2016   Urinary frequency 12/22/2017   Urinary incontinence 05/07/2019   Urinary tract infection 11/04/2016   Vitamin D  deficiency 04/29/2016    Social History   Socioeconomic History   Marital status: Single    Spouse name: Not on file   Number of children: 3   Years of education: 18   Highest education level: Bachelor's degree (e.g., BA, AB, BS)  Occupational History   Occupation: retired    Comment: runner, broadcasting/film/video, kindergarten  Tobacco Use   Smoking status: Never   Smokeless tobacco: Never  Vaping Use   Vaping status: Never Used  Substance and Sexual Activity   Alcohol use: Never    Alcohol/week: 0.0 standard drinks of alcohol   Drug use: Never   Sexual activity: Not Currently    Birth control/protection: None    Comment: lives alone, avoids daiiry and gluten. 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  Other Topics Concern   Not on file  Social History Narrative   Not on file   Social Drivers of Health   Tobacco Use: Low Risk (05/21/2024)   Patient History    Smoking Tobacco Use: Never    Smokeless Tobacco Use: Never    Passive Exposure: Not on file  Financial Resource Strain: Low Risk (10/18/2023)   Received from [facility]   Overall Financial Resource Strain (CARDIA)    Difficulty of Paying Living Expenses: Not hard at all  Food Insecurity: No Food Insecurity (10/18/2023)   Received from [facility]   Epic    Within the past 12 months, you worried that your food would run out before you got the money to buy more.: Never true    Within the past 12 months, the food you bought just didn't last and you didn't  have money to get more.: Never true  Transportation Needs: No Transportation Needs (10/18/2023)   Received from [facility] - Transportation    Lack of Transportation (Medical): No    Lack of Transportation (Non-Medical): No  Physical Activity: Sufficiently Active (06/14/2023)   Exercise Vital Sign    Days of Exercise per Week: 3 days    Minutes of Exercise per Session: 60 min  Recent Concern: Physical Activity - Inactive (05/24/2023)   Exercise Vital Sign    Days of Exercise per Week: 0 days    Minutes of Exercise per Session: 20 min  Stress: No Stress Concern Present (06/14/2023)   [employer] - Occupational Stress Questionnaire  Feeling of Stress : Not at all  Social Connections: Socially Integrated (10/18/2023)   Received from [facility]   Social Network    How would you rate your social network (family, work, friends)?: Good participation with social networks  Intimate Partner Violence: Not At Risk (10/18/2023)   Received from [facility]   HITS    Over the last 12 months how often did your partner physically hurt you?: Never    Over the last 12 months how often did your partner insult you or talk down to you?: Never    Over the last 12 months how often did your partner threaten you with physical harm?: Never    Over the last 12 months how often did your partner scream or curse at you?: Never  Depression (PHQ2-9): Low Risk (03/22/2024)   Depression (PHQ2-9)    PHQ-2 Score: 0  Alcohol Screen: Low Risk (06/14/2023)   Alcohol Screen    Last Alcohol Screening Score (AUDIT): 0  Housing: Low Risk (10/18/2023)   Received from [facility]    In the last 12 months, was there a time when you were not able to pay the mortgage or rent on time?: No    In the past 12 months, how many times have you moved where you were living?: 0    At any time in the past 12 months, were you homeless or living in a shelter (including  now)?: No  Utilities: Not At Risk (10/18/2023)   Received from [facility] Utilities    Threatened with loss of utilities: No  Health Literacy: Adequate Health Literacy (06/14/2023)   B1300 Health Literacy    Frequency of need for help with medical instructions: Never    Past Surgical History:  Procedure Laterality Date   ABDOMINAL HYSTERECTOMY     partial   APPENDECTOMY     BREAST LUMPECTOMY Left 2002   CHOLECYSTECTOMY  Age 35 or 41   COLONOSCOPY  2017   EYE SURGERY Left    cataract   LEFT HEART CATH AND CORONARY ANGIOGRAPHY N/A 02/09/2019   Procedure: LEFT HEART CATH AND CORONARY ANGIOGRAPHY;  Surgeon: Mady Bruckner, MD;  Location: [facility] INVASIVE CV LAB;  Service: Cardiovascular;  Laterality: N/A;   TONSILLECTOMY     TOTAL HIP ARTHROPLASTY Left 02/13/2019   Procedure: TOTAL HIP ARTHROPLASTY ANTERIOR APPROACH;  Surgeon: Ernie Cough, MD;  Location: [facility] ORS;  Service: Orthopedics;  Laterality: Left;  70 mins   UPPER GASTROINTESTINAL ENDOSCOPY      Family History  Problem Relation Age of Onset   Colitis Mother    Irritable bowel syndrome Mother    Hypertension Father    Hyperlipidemia Sister    Hyperlipidemia Brother    Heart attack Brother    Hyperlipidemia Brother    Heart attack Brother    Hyperlipidemia Brother    Heart attack Brother    Stomach cancer Paternal Grandmother 58   Colon cancer Paternal Grandmother    Leukemia Daughter    Arthritis Daughter    Heart disease Daughter        ASD vs VSD   Esophageal cancer Neg Hx    Pancreatic cancer Neg Hx    Rectal cancer Neg Hx     Allergies[1]  Medications Ordered Prior to Encounter[2]  BP 118/64 (BP Location: Left Arm, Patient Position: Sitting, Cuff Size: Normal)   Pulse 83   Temp 97.8 F (36.6 C) (Oral)   Resp 14  Ht 4' 10 (1.473 m)   Wt 112 lb (50.8 kg)   SpO2 97%   BMI 23.41 kg/m chart     Objective:    BP 118/64 (BP Location: Left Arm, Patient Position:  Sitting, Cuff Size: Normal)   Pulse 83   Temp 97.8 F (36.6 C) (Oral)   Resp 14   Ht 4' 10 (1.473 m)   Wt 112 lb (50.8 kg)   SpO2 97%   BMI 23.41 kg/m    Physical Exam Constitutional:      Appearance: Normal appearance. She is normal weight.  HENT:     Right Ear: Tympanic membrane, ear canal and external ear normal.     Left Ear: Tympanic membrane, ear canal and external ear normal.     Nose: Congestion present.     Mouth/Throat:     Mouth: Mucous membranes are moist.     Pharynx: Posterior oropharyngeal erythema present. No oropharyngeal exudate.  Cardiovascular:     Rate and Rhythm: Normal rate and regular rhythm.  Pulmonary:     Effort: Pulmonary effort is normal.     Breath sounds: Normal breath sounds.  Musculoskeletal:        General: Normal range of motion.     Cervical back: Normal range of motion and neck supple.  Skin:    General: Skin is warm and dry.  Neurological:     General: No focal deficit present.     Mental Status: She is alert and oriented to person, place, and time. Mental status is at baseline.  Psychiatric:        Mood and Affect: Mood normal.        Behavior: Behavior normal.        Thought Content: Thought content normal.        Judgment: Judgment normal.    Results for orders placed or performed in visit on 05/21/24  POC COVID-19 BinaxNow  Result Value Ref Range   SARS Coronavirus 2 Ag Negative Negative  POCT Influenza A/B  Result Value Ref Range   Influenza A, POC Negative Negative   Influenza B, POC Negative Negative        Assessment & Plan:   Problem List Items Addressed This Visit   None Visit Diagnoses       Sore throat    -  Primary   Relevant Orders   POC COVID-19 BinaxNow (Completed)   POCT Influenza A/B (Completed)     Acute cough       Relevant Orders   POC COVID-19 BinaxNow (Completed)   POCT Influenza A/B (Completed)     Flu-like symptoms         Viral upper respiratory tract infection           Meds  ordered this encounter  Medications   methylPREDNISolone  (MEDROL  DOSEPAK) 4 MG TBPK tablet    Sig: As directed    Dispense:  21 tablet    Refill:  0   benzonatate  (TESSALON ) 200 MG capsule    Sig: Take 1 capsule (200 mg total) by mouth 3 (three) times daily as needed for cough.    Dispense:  20 capsule    Refill:  0  Call the office if symptoms worsen or persist.  Recheck as scheduled and sooner as needed.  No follow-ups on file.  Tova Vater B Jaeli Grubb, FNP       [1] Allergies Allergen Reactions   Valium  [Diazepam ] Shortness Of Breath    Sob and bradycardia  Albuterol  And Levalbuterol  Palpitations    Episode of palpitations, tremor and anxiety when administered levalbuterol  for PFT. Recommend avoiding this medicaiton class   Rosuvastatin  Other (See Comments)    myalgias  [2] Current Outpatient Medications on File Prior to Visit  Medication Sig Dispense Refill   atorvastatin  (LIPITOR) 20 MG tablet Take 1 tablet (20 mg total) by mouth daily. 30 tablet 6   citalopram  (CELEXA ) 20 MG tablet Take 1 tablet (20 mg total) by mouth at bedtime. 30 tablet 3   DULoxetine  (CYMBALTA ) 30 MG capsule Take 30 mg by mouth daily.     estradiol  (ESTRACE ) 0.1 MG/GM vaginal cream Place 0.5 g vaginally 2 (two) times a week. Place 0.5g nightly for two weeks then twice a week after 42.5 g 11   fexofenadine  (ALLEGRA  ALLERGY ) 180 MG tablet Take 1 tablet (180 mg total) by mouth daily as needed for allergies or rhinitis.     Fluticasone  Furoate (ARNUITY ELLIPTA ) 100 MCG/ACT AEPB Inhale 1 puff into the lungs daily. 30 each 5   furosemide  (LASIX ) 20 MG tablet Take 1 tablet (20 mg total) by mouth daily. 90 tablet 1   hyoscyamine  (LEVSIN  SL) 0.125 MG SL tablet Dissolve 1 tablet under tongue at least twice daily 60 tablet 2   levothyroxine  (SYNTHROID ) 50 MCG tablet TAKE 1 TABLET BY MOUTH ONCE DAILY BEFORE BREAKFAST 90 tablet 1   lisinopril  (ZESTRIL ) 10 MG tablet Take 0.5 tablets (5 mg total) by mouth 2  (two) times daily. 90 tablet 1   Loteprednol Etabonate 0.5 % GEL Apply 1 drop to eye 4 (four) times daily.     Magnesium  Citrate (MAGNESIUM  GUMMIES PO) Take by mouth daily.     omega-3 acid ethyl esters (LOVAZA ) 1 g capsule Take 2 g by mouth daily.     omeprazole  (PRILOSEC) 40 MG capsule Take 1 capsule (40 mg total) by mouth 2 (two) times daily. 60 capsule 5   ondansetron  (ZOFRAN -ODT) 4 MG disintegrating tablet Take 4 mg by mouth every 8 (eight) hours as needed.     Peppermint Oil (IBGARD) 90 MG CPCR Use as directed     potassium chloride  (MICRO-K ) 10 MEQ CR capsule Take 1 capsule (10 mEq total) by mouth daily. 90 capsule 0   sucralfate  (CARAFATE ) 1 g tablet Take 1 tablet by mouth twice daily 60 tablet 0   valACYclovir  (VALTREX ) 1000 MG tablet Take 1 tablet (1,000 mg total) by mouth 2 (two) times daily. 14 tablet 0   Vitamin D , Ergocalciferol , (DRISDOL ) 1.25 MG (50000 UNIT) CAPS capsule Take 1 capsule (50,000 Units total) by mouth every 7 (seven) days. 5 capsule 4   XDEMVY 0.25 % SOLN Apply 1 drop to eye 2 (two) times daily.     No current facility-administered medications on file prior to visit.  "

## 2024-05-22 ENCOUNTER — Ambulatory Visit

## 2024-05-22 NOTE — Progress Notes (Deleted)
 Patient ID: Julie Wilkerson                 DOB: [birth year]                    MRN: [id number]      HPI: Julie Wilkerson is a 83 y.o. female patient referred to lipid clinic by Julie Wilkerson. PMH is nonobstructive CAD, HTN, HLD, IBS, GERD and hx of breast cancer, significant for HTN, atrial tachycardia   Patient was last evaluated in October 2025.     different statin - side effects from crestor  seemed to improve off of medications. She is awaiting lipid clinic pharmD appt. In the meantime we can trial 20 mg lipitor.   Reviewed options for lowering LDL cholesterol, including ezetimibe, PCSK-9 inhibitors, bempedoic acid and inclisiran.  Discussed mechanisms of action, dosing, side effects and potential decreases in LDL cholesterol.  Also reviewed cost information and potential options for patient assistance.   Current Medications: atorvastatin  20 mg daily  Intolerances: rosuvastatin  (myalgias) Risk Factors: age, nonobstructive CAD, HTN,  LDL goal: < 55 Lipid panel (02/2024): Chol 166, Trig 109, HDL 75, LDL 68 (reflective of? Liver enzymes (02/2024): Ast 18,   Diet:  Breakfast: Lunch/Dinner: Snacks: Beverages:  Exercise:   Family History:   Relation Problem Comments  Mother (Deceased at age 82) Colitis   Irritable bowel syndrome     Father (Deceased) Hypertension     Sister Metallurgist, age 49y) Hyperlipidemia     Brother (Deceased) Heart attack   Hyperlipidemia     Brother Metallurgist, age 61y) Heart attack   Hyperlipidemia     Brother (Alive, age 75y) Heart attack   Hyperlipidemia     Maternal Grandmother (Deceased)   Maternal Grandfather (Deceased)   Paternal Grandmother (Deceased) Colon cancer   Stomach cancer (Age: 67)     Paternal Grandfather (Deceased)   Daughter (Alive, age 55y) Leukemia     Daughter - 85 (Alive) Arthritis   Heart disease ASD vs VSD    Daughter - 79 (Alive)   Neg Hx Esophageal cancer   Pancreatic cancer   Rectal cancer        Social History:   Alcohol: Smoking:  Labs:  Lipid Panel     Component Value Date/Time   CHOL 166 03/22/2024 1037   TRIG 109.0 03/22/2024 1037   HDL 75.90 03/22/2024 1037   CHOLHDL 2 03/22/2024 1037   VLDL 21.8 03/22/2024 1037   LDLCALC 68 03/22/2024 1037   LDLCALC 79 02/01/2020 1020    Past Medical History:  Diagnosis Date   Abdominal pain 11/07/2016   Accelerating angina (HCC) 02/09/2019   Acute diastolic CHF (congestive heart failure) (HCC) 02/16/2019   Acute on chronic diastolic CHF (congestive heart failure) (HCC) 02/23/2019   Acute pansinusitis 04/26/2017   Amblyopia of eye, left 04/19/2018   Anemia    Anginal pain    Anxiety    Arthritis    Asthma    Atypical chest pain 02/04/2019   Bilateral carotid bruits 03/28/2017   Blood transfusion without reported diagnosis    Breast cancer (HCC)    2002, left, encapsulated, microcalcifications. lumpectomy, radtiation x 30   Breast cancer in female [facility])    Bursitis of left hip 09/04/2018   Change in bowel habits 02/22/2018   Change in mole 06/08/2016   Chest pain 03/04/2020   CHF (congestive heart failure) (HCC)    Chronic bilateral thoracic back pain 05/23/2016   Chronic diastolic congestive  heart failure (HCC) 05/28/2019   Colitis    Complication of anesthesia    VERY SENSITIVE TO ANESTHESIA    Constipation 01/17/2018   Coronary artery disease involving native coronary artery of native heart without angina pectoris 05/28/2019   Cortical age-related cataract of right eye 04/18/2018   Cough variant asthma  vs UACS from ACEi     Spirometry 12/02/2016  FEV1 1.26 (76%)  Ratio 78 with min curvature p last saba > 6 h prior  - trial off acei 12/02/2016  - 12/02/2016  After extensive coaching HFA effectiveness =    75% with qvar  autohaler > rechallenge with 80 2bid     Decreased visual acuity 01/16/2017   Degeneration of lumbar intervertebral disc 08/01/2018   Diarrhea 11/16/2015   D/o Diverticuli, polyps, celiac disease.     Dyspnea  11/04/2016   with asthma exacerbation only   Dysuria 11/04/2016   Elevated sed rate 10/23/2017   Essential hypertension    Trial off acei 12/02/2016 due to cough/ pseudoasthma   GERD (gastroesophageal reflux disease)    Gluten intolerance    History of chicken pox    History of Helicobacter pylori infection 08/01/2015   History of rectal bleeding 06/01/2019   Hx of LASIK 04/24/2019   Hyperglycemia 11/07/2016   Hyperlipidemia    Hypertension    Hyponatremia 02/16/2019   Hypothyroidism    IBS (irritable bowel syndrome)    Lattice degeneration of right retina 04/24/2019   Left hand pain 05/07/2018   Left hip pain 03/01/2017   Left leg pain 03/01/2017   Left shoulder pain 05/07/2018   Low back pain 08/10/2015   LUQ pain 06/01/2019   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   Neck pain 03/01/2017   Nuclear sclerotic cataract of right eye 04/18/2018   Osteoporosis    Overweight (BMI 25.0-29.9) 02/14/2019   Pain in joint of left shoulder 08/11/2018   Paroxysmal atrial tachycardia 05/28/2019   Pelvic pain 07/02/2018   Personal history of radiation therapy    Pneumonia    PONV (postoperative nausea and vomiting)    Poor appetite 02/22/2018   Pseudophakia of left eye 04/18/2018   PVD (posterior vitreous detachment), right 04/24/2019   Restless sleeper 11/07/2016   Right hip pain 10/23/2017   RLQ discomfort 03/01/2017   RLS (restless legs syndrome) 05/07/2019   S/P left THA, AA 02/13/2019   Sepsis due to pneumonia (HCC) 02/16/2019   Sleep apnea    uses CPAP   Stomach cramps    Tremor 02/15/2016   Urinary frequency 12/22/2017   Urinary incontinence 05/07/2019   Urinary tract infection 11/04/2016   Vitamin D  deficiency 04/29/2016    Medications Ordered Prior to Encounter[1]  Allergies[2]  Assessment/Plan:  1. Hyperlipidemia -  No problems updated. No problem-specific Assessment & Plan notes found for this encounter.    Thank  you,  Mylene Bow E. Daiveon Markman, Pharm.D, CPP [name]. [facility] [address], [place], [place] [address] Phone: [phone]; Fax: [phone]        [1]  Current Outpatient Medications on File Prior to Visit  Medication Sig Dispense Refill   atorvastatin  (LIPITOR) 20 MG tablet Take 1 tablet (20 mg total) by mouth daily. 30 tablet 6   benzonatate  (TESSALON ) 200 MG capsule Take 1 capsule (200 mg total) by mouth 3 (three) times daily as needed for cough. 20 capsule 0   citalopram  (CELEXA ) 20 MG tablet  Take 1 tablet (20 mg total) by mouth at bedtime. 30 tablet 3   DULoxetine  (CYMBALTA ) 30 MG capsule Take 30 mg by mouth daily.     estradiol  (ESTRACE ) 0.1 MG/GM vaginal cream Place 0.5 g vaginally 2 (two) times a week. Place 0.5g nightly for two weeks then twice a week after 42.5 g 11   fexofenadine  (ALLEGRA  ALLERGY ) 180 MG tablet Take 1 tablet (180 mg total) by mouth daily as needed for allergies or rhinitis.     Fluticasone  Furoate (ARNUITY ELLIPTA ) 100 MCG/ACT AEPB Inhale 1 puff into the lungs daily. 30 each 5   furosemide  (LASIX ) 20 MG tablet Take 1 tablet (20 mg total) by mouth daily. 90 tablet 1   hyoscyamine  (LEVSIN  SL) 0.125 MG SL tablet Dissolve 1 tablet under tongue at least twice daily 60 tablet 2   levothyroxine  (SYNTHROID ) 50 MCG tablet TAKE 1 TABLET BY MOUTH ONCE DAILY BEFORE BREAKFAST 90 tablet 1   lisinopril  (ZESTRIL ) 10 MG tablet Take 0.5 tablets (5 mg total) by mouth 2 (two) times daily. 90 tablet 1   Loteprednol Etabonate 0.5 % GEL Apply 1 drop to eye 4 (four) times daily.     Magnesium  Citrate (MAGNESIUM  GUMMIES PO) Take by mouth daily.     methylPREDNISolone  (MEDROL  DOSEPAK) 4 MG TBPK tablet As directed 21 tablet 0   omega-3 acid ethyl esters (LOVAZA ) 1 g capsule Take 2 g by mouth daily.     omeprazole  (PRILOSEC) 40 MG capsule Take 1 capsule (40 mg total) by mouth 2 (two) times daily. 60 capsule 5   ondansetron  (ZOFRAN -ODT) 4 MG  disintegrating tablet Take 4 mg by mouth every 8 (eight) hours as needed.     Peppermint Oil (IBGARD) 90 MG CPCR Use as directed     potassium chloride  (MICRO-K ) 10 MEQ CR capsule Take 1 capsule (10 mEq total) by mouth daily. 90 capsule 0   sucralfate  (CARAFATE ) 1 g tablet Take 1 tablet by mouth twice daily 60 tablet 0   valACYclovir  (VALTREX ) 1000 MG tablet Take 1 tablet (1,000 mg total) by mouth 2 (two) times daily. 14 tablet 0   Vitamin D , Ergocalciferol , (DRISDOL ) 1.25 MG (50000 UNIT) CAPS capsule Take 1 capsule (50,000 Units total) by mouth every 7 (seven) days. 5 capsule 4   XDEMVY 0.25 % SOLN Apply 1 drop to eye 2 (two) times daily.     No current facility-administered medications on file prior to visit.  [2]  Allergies Allergen Reactions   Valium  [Diazepam ] Shortness Of Breath    Sob and bradycardia   Albuterol  And Levalbuterol  Palpitations    Episode of palpitations, tremor and anxiety when administered levalbuterol  for PFT. Recommend avoiding this medicaiton class   Rosuvastatin  Other (See Comments)    myalgias

## 2024-06-03 ENCOUNTER — Other Ambulatory Visit: Payer: Self-pay | Admitting: Family Medicine

## 2024-06-04 ENCOUNTER — Encounter: Payer: Self-pay | Admitting: *Deleted

## 2024-06-11 ENCOUNTER — Ambulatory Visit: Payer: Self-pay

## 2024-06-11 ENCOUNTER — Ambulatory Visit: Payer: Self-pay | Admitting: *Deleted

## 2024-06-11 NOTE — Telephone Encounter (Signed)
 FYI Only or Action Required?: FYI only for provider: appointment scheduled on 06/12/24.  Patient was last seen in primary care on 05/21/2024 by Douglass Kenney NOVAK, FNP.  Called Nurse Triage reporting Cough.  Symptoms began several weeks ago.  Interventions attempted: OTC medications: inhalers, Rest, hydration, or home remedies, and Other: finished prescribed medication from 05/21/24 OV.  Symptoms are: gradually worsening.  Triage Disposition: See Physician Within 24 Hours  Patient/caregiver understands and will follow disposition?: Yes                Copied from CRM [id number]. Topic: Clinical - Red Word Triage >> Jun 11, 2024  8:59 AM Gustabo D wrote: Pt was seen a few weeks ago for coughing and was given medication she finished but is still coughing up stuff and really tired. >> Jun 11, 2024  9:00 AM Gustabo D wrote: Pt was seen a few weeks ago for coughing and was given medication she finished but is still coughing up stuff and really tired. Reason for Disposition  [1] Continuous (nonstop) coughing interferes with work or school AND [2] no improvement using cough treatment per Care Advice  Answer Assessment - Initial Assessment Questions Appt schedule tomorrow with other provider.  Earliest available. Recommended if sx worsen go to UC.      1. ONSET: When did the cough begin?      Few weeks  2. SEVERITY: How bad is the cough today?      worse 3. SPUTUM: Describe the color of your sputum (e.g., none, dry cough; clear, white, yellow, green)     dark 4. HEMOPTYSIS: Are you coughing up any blood? If Yes, ask: How much? (e.g., flecks, streaks, tablespoons, etc.)     na 5. DIFFICULTY BREATHING: Are you having difficulty breathing? If Yes, ask: How bad is it? (e.g., mild, moderate, severe)      Denies breathing issues. Does report wheezing at times and better with nebulizer treatments/ inhaler 6. FEVER: Do you have a fever? If Yes, ask: What is your  temperature, how was it measured, and when did it start?     na 7. CARDIAC HISTORY: Do you have any history of heart disease? (e.g., heart attack, congestive heart failure)      Na  8. LUNG HISTORY: Do you have any history of lung disease?  (e.g., pulmonary embolus, asthma, emphysema)     Hx asthma  9. PE RISK FACTORS: Do you have a history of blood clots? (or: recent major surgery, recent prolonged travel, bedridden)     na 10. OTHER SYMPTOMS: Do you have any other symptoms? (e.g., runny nose, wheezing, chest pain)       Hx asthma  coughing worsening brownish phlegm. Chest tightness with coughing. Tired. Wheezing at times not now. No fever, no chest pain no difficulty breathing .  11. PREGNANCY: Is there any chance you are pregnant? When was your last menstrual period?       na 12. TRAVEL: Have you traveled out of the country in the last month? (e.g., travel history, exposures)       na  Protocols used: Cough - Acute Productive-A-AH

## 2024-06-11 NOTE — Telephone Encounter (Signed)
 Appt scheduled

## 2024-06-11 NOTE — Telephone Encounter (Signed)
 Erroneous; pt scheduled and spoke to NT

## 2024-06-12 ENCOUNTER — Ambulatory Visit: Admitting: Medical

## 2024-06-12 ENCOUNTER — Ambulatory Visit (HOSPITAL_BASED_OUTPATIENT_CLINIC_OR_DEPARTMENT_OTHER)
Admission: RE | Admit: 2024-06-12 | Discharge: 2024-06-12 | Disposition: A | Discharge status: Home / Self Care | Source: Ambulatory Visit | Attending: Medical | Admitting: Medical

## 2024-06-12 ENCOUNTER — Encounter: Payer: Self-pay | Admitting: Medical

## 2024-06-12 ENCOUNTER — Ambulatory Visit: Payer: Self-pay | Admitting: Medical

## 2024-06-12 VITALS — BP 116/60 | HR 77 | Temp 97.6°F | Resp 15 | Ht <= 58 in | Wt 110.6 lb

## 2024-06-12 DIAGNOSIS — R062 Wheezing: Secondary | ICD-10-CM

## 2024-06-12 DIAGNOSIS — J4520 Mild intermittent asthma, uncomplicated: Secondary | ICD-10-CM

## 2024-06-12 DIAGNOSIS — R051 Acute cough: Secondary | ICD-10-CM

## 2024-06-12 DIAGNOSIS — J40 Bronchitis, not specified as acute or chronic: Secondary | ICD-10-CM | POA: Diagnosis present

## 2024-06-12 DIAGNOSIS — R5383 Other fatigue: Secondary | ICD-10-CM

## 2024-06-12 DIAGNOSIS — J45901 Unspecified asthma with (acute) exacerbation: Secondary | ICD-10-CM | POA: Diagnosis not present

## 2024-06-12 LAB — COMPREHENSIVE METABOLIC PANEL WITH GFR
ALT: 15 U/L (ref 3–35)
AST: 20 U/L (ref 5–37)
Albumin: 4.1 g/dL (ref 3.5–5.2)
Alkaline Phosphatase: 73 U/L (ref 39–117)
BUN: 29 mg/dL — ABNORMAL HIGH (ref 6–23)
CO2: 27 meq/L (ref 19–32)
Calcium: 8.7 mg/dL (ref 8.4–10.5)
Chloride: 108 meq/L (ref 96–112)
Creatinine, Ser: 1 mg/dL (ref 0.40–1.20)
GFR: 52.45 mL/min — ABNORMAL LOW
Glucose, Bld: 113 mg/dL — ABNORMAL HIGH (ref 70–99)
Potassium: 4.4 meq/L (ref 3.5–5.1)
Sodium: 138 meq/L (ref 135–145)
Total Bilirubin: 0.4 mg/dL (ref 0.2–1.2)
Total Protein: 7 g/dL (ref 6.0–8.3)

## 2024-06-12 LAB — POC COVID19/FLU A&B COMBO
Covid Antigen, POC: NEGATIVE
Influenza A Antigen, POC: NEGATIVE
Influenza B Antigen, POC: NEGATIVE

## 2024-06-12 LAB — CBC WITH DIFFERENTIAL/PLATELET
Basophils Absolute: 0 K/uL (ref 0.0–0.1)
Basophils Relative: 0.8 % (ref 0.0–3.0)
Eosinophils Absolute: 0 K/uL (ref 0.0–0.7)
Eosinophils Relative: 0 % (ref 0.0–5.0)
HCT: 31 % — ABNORMAL LOW (ref 36.0–46.0)
Hemoglobin: 10.3 g/dL — ABNORMAL LOW (ref 12.0–15.0)
Lymphocytes Relative: 36.7 % (ref 12.0–46.0)
Lymphs Abs: 1.4 K/uL (ref 0.7–4.0)
MCHC: 33.1 g/dL (ref 30.0–36.0)
MCV: 87.4 fl (ref 78.0–100.0)
Monocytes Absolute: 0.5 K/uL (ref 0.1–1.0)
Monocytes Relative: 12.3 % — ABNORMAL HIGH (ref 3.0–12.0)
Neutro Abs: 1.9 K/uL (ref 1.4–7.7)
Neutrophils Relative %: 50.2 % (ref 43.0–77.0)
Platelets: 209 K/uL (ref 150.0–400.0)
RBC: 3.55 Mil/uL — ABNORMAL LOW (ref 3.87–5.11)
RDW: 15.1 % (ref 11.5–15.5)
WBC: 3.9 K/uL — ABNORMAL LOW (ref 4.0–10.5)

## 2024-06-12 MED ORDER — BENZONATATE 100 MG PO CAPS: 100.0000 mg | ORAL_CAPSULE | Freq: Three times a day (TID) | ORAL | 0 refills | Status: AC | PRN

## 2024-06-12 MED ORDER — AZITHROMYCIN 250 MG PO TABS: ORAL_TABLET | ORAL | 0 refills | Status: AC

## 2024-06-12 NOTE — Patient Instructions (Signed)
 Bronchitis with asthma exacerbation Persistent respiratory symptoms with wheezing, likely due to respiratory infection. Previous antibiotic treatment caused joint pain and fatigue. - Ordered chest x-ray to evaluate for pneumonia. - Ordered CBC to assess for infection. - Ordered metabolic panel to evaluate fatigue and nutritional status. - Prescribed azithromycin  (Z-Pak) for potential bacterial infection. - Prescribed benzonatate  for cough. - Continue nebulizer treatment at night. - Consider Medrol  tapered dose pack if wheezing worsens. - Monitor oxygen saturation and respiratory status.  Fatigue Persistent fatigue. Maybe related to some dehydration or illness. - Ordered metabolic panel to assess nutritional status and electrolyte balance. - Encouraged hydration and nutritional intake.  Follow up next tuesday or sooner if needed

## 2024-06-12 NOTE — Progress Notes (Signed)
 "  Subjective:    Patient ID: Julie Wilkerson, female    DOB: 1941/03/14, 84 y.o.   MRN: [id number]  HPI  CATLYN Wilkerson is an 84 year old female with asthma who presents with persistent respiratory symptoms and fatigue.  Since a cold around Christmas and New Year's, she has had a persistent cough, nasal congestion, and rhinorrhea that did not resolve when the rest of her family improved. Over the past month her symptoms progressed to thick green mucus and marked fatigue.  She has chest congestion with chest and back pain that occurs  with coughing, present throughout this month. She notes gurgling and rough breath sounds, especially when lying flat at night. She has poor appetite and believes she is losing weight.  She was seen on December 29th and tested negative for COVID-19 and influenza. At that time she had ear pain, sore throat, and muscle aches and was diagnosed with a viral upper respiratory infection, but her symptoms have persisted.  She uses a nebulizer with levalbuterol , which gives partial relief, especially in the evenings when wheezing is worse. She has asthma that tends to flare with respiratory illnesses. She had side effect to albuterol , which causes palpitations and jitteriness.  She recently completed a steroid dose pack prescribed by a nurse over a week ago. She is trying to maintain hydration with frequent water intake despite poor appetite noted by her daughter.    Review of Systems  Constitutional:  Positive for fatigue. Negative for chills and fever.  HENT:  Positive for congestion. Negative for ear pain and sore throat.   Respiratory:  Positive for cough and wheezing. Negative for choking and shortness of breath.   Cardiovascular:  Negative for chest pain and palpitations.  Gastrointestinal:  Negative for abdominal pain.  Genitourinary:  Negative for difficulty urinating, dysuria, flank pain and frequency.  Musculoskeletal:  Negative for back pain.  Skin:  Negative  for rash.  Neurological:  Negative for dizziness, syncope, weakness and numbness.  Psychiatric/Behavioral:  Negative for behavioral problems, decreased concentration and hallucinations. The patient is not nervous/anxious.     Past Medical History:  Diagnosis Date   Abdominal pain 11/07/2016   Accelerating angina (HCC) 02/09/2019   Acute diastolic CHF (congestive heart failure) (HCC) 02/16/2019   Acute on chronic diastolic CHF (congestive heart failure) (HCC) 02/23/2019   Acute pansinusitis 04/26/2017   Amblyopia of eye, left 04/19/2018   Anemia    Anginal pain    Anxiety    Arthritis    Asthma    Atypical chest pain 02/04/2019   Bilateral carotid bruits 03/28/2017   Blood transfusion without reported diagnosis    Breast cancer (HCC)    2002, left, encapsulated, microcalcifications. lumpectomy, radtiation x 30   Breast cancer in female [facility])    Bursitis of left hip 09/04/2018   Change in bowel habits 02/22/2018   Change in mole 06/08/2016   Chest pain 03/04/2020   CHF (congestive heart failure) (HCC)    Chronic bilateral thoracic back pain 05/23/2016   Chronic diastolic congestive heart failure (HCC) 05/28/2019   Colitis    Complication of anesthesia    VERY SENSITIVE TO ANESTHESIA    Constipation 01/17/2018   Coronary artery disease involving native coronary artery of native heart without angina pectoris 05/28/2019   Cortical age-related cataract of right eye 04/18/2018   Cough variant asthma  vs UACS from ACEi     Spirometry 12/02/2016  FEV1 1.26 (76%)  Ratio 78 with min  curvature p last saba > 6 h prior  - trial off acei 12/02/2016  - 12/02/2016  After extensive coaching HFA effectiveness =    75% with qvar  autohaler > rechallenge with 80 2bid     Decreased visual acuity 01/16/2017   Degeneration of lumbar intervertebral disc 08/01/2018   Diarrhea 11/16/2015   D/o Diverticuli, polyps, celiac disease.     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   Left hand pain 05/07/2018   Left hip pain 03/01/2017   Left leg pain 03/01/2017   Left shoulder pain 05/07/2018   Low back pain 08/10/2015   LUQ pain 06/01/2019   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   Neck pain 03/01/2017   Nuclear sclerotic cataract of right eye 04/18/2018   Osteoporosis    Overweight (BMI 25.0-29.9) 02/14/2019   Pain in joint of left shoulder 08/11/2018   Paroxysmal atrial tachycardia 05/28/2019   Pelvic pain 07/02/2018   Personal history of radiation therapy    Pneumonia    PONV (postoperative nausea and vomiting)    Poor appetite 02/22/2018   Pseudophakia of left eye 04/18/2018   PVD (posterior vitreous detachment), right 04/24/2019   Restless sleeper 11/07/2016   Right hip pain 10/23/2017   RLQ discomfort 03/01/2017   RLS (restless legs syndrome) 05/07/2019   S/P left THA, AA 02/13/2019   Sepsis due to pneumonia (HCC) 02/16/2019   Sleep apnea    uses CPAP   Stomach cramps    Tremor 02/15/2016   Urinary frequency 12/22/2017   Urinary incontinence 05/07/2019   Urinary tract infection 11/04/2016   Vitamin D  deficiency 04/29/2016     Social History   Socioeconomic History   Marital status: Single    Spouse name: Not on file   Number of children: 3   Years of education: 18   Highest education level: Bachelor's degree (e.g., BA, AB, BS)  Occupational History   Occupation: retired     Comment: runner, broadcasting/film/video, kindergarten  Tobacco Use   Smoking status: Never   Smokeless tobacco: Never  Vaping Use   Vaping status: Never Used  Substance and Sexual Activity   Alcohol use: Never    Alcohol/week: 0.0 standard drinks of alcohol   Drug use: Never   Sexual activity: Not Currently    Birth control/protection: None    Comment: lives alone, avoids daiiry and gluten. 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  Other Topics Concern   Not on file  Social History Narrative   Not on file   Social Drivers of Health   Tobacco Use: Low Risk (05/21/2024)   Patient History    Smoking Tobacco Use: Never    Smokeless Tobacco Use: Never    Passive Exposure: Not on file  Financial Resource Strain: Low Risk (10/18/2023)   Received from [facility]   Overall Financial Resource Strain (CARDIA)    Difficulty of Paying Living  Expenses: Not hard at all  Food Insecurity: No Food Insecurity (10/18/2023)   [facility]   Epic    Within the past 12 months, you worried that your food would run out before you got the money to buy more.: Never true    Within the past 12 months, the food you bought just didn't last and you didn't have money to get more.: Never true  Transportation Needs: No Transportation Needs (10/18/2023)   Received from [facility]   PRAPARE - Transportation    Lack of Transportation (Medical): No    Lack of Transportation (Non-Medical): No  Physical Activity: Sufficiently Active (06/14/2023)   Exercise Vital Sign    Days of Exercise per Week: 3 days    Minutes of Exercise per Session: 60 min  Recent Concern: Physical Activity - Inactive (05/24/2023)   Exercise Vital Sign    Days of Exercise per Week: 0 days    Minutes of Exercise per Session: 20 min  Stress: No Stress Concern Present (06/14/2023)   [employer] - Occupational Stress Questionnaire    Feeling of Stress : Not at all  Social Connections: Socially Integrated (10/18/2023)   Received from  [facility]   Social Network    How would you rate your social network (family, work, friends)?: Good participation with social networks  Intimate Partner Violence: Not At Risk (10/18/2023)   Received from [facility]   HITS    Over the last 12 months how often did your partner physically hurt you?: Never    Over the last 12 months how often did your partner insult you or talk down to you?: Never    Over the last 12 months how often did your partner threaten you with physical harm?: Never    Over the last 12 months how often did your partner scream or curse at you?: Never  Depression (PHQ2-9): Low Risk (03/22/2024)   Depression (PHQ2-9)    PHQ-2 Score: 0  Alcohol Screen: Low Risk (06/14/2023)   Alcohol Screen    Last Alcohol Screening Score (AUDIT): 0  Housing: Low Risk (10/18/2023)   Received from [facility]    In the last 12 months, was there a time when you were not able to pay the mortgage or rent on time?: No    In the past 12 months, how many times have you moved where you were living?: 0    At any time in the past 12 months, were you homeless or living in a shelter (including now)?: No  Utilities: Not At Risk (10/18/2023)   Received from [facility] Utilities    Threatened with loss of utilities: No  Health Literacy: Adequate Health Literacy (06/14/2023)   B1300 Health Literacy    Frequency of need for help with medical instructions: Never    Past Surgical History:  Procedure Laterality Date   ABDOMINAL HYSTERECTOMY     partial   APPENDECTOMY     BREAST LUMPECTOMY Left 2002   CHOLECYSTECTOMY  Age 72 or 41   COLONOSCOPY  2017   EYE SURGERY Left    cataract   LEFT HEART CATH AND CORONARY ANGIOGRAPHY N/A 02/09/2019   Procedure: LEFT HEART CATH AND CORONARY ANGIOGRAPHY;  Surgeon: Mady Bruckner, MD;  Location: [facility] INVASIVE CV LAB;  Service: Cardiovascular;  Laterality: N/A;   TONSILLECTOMY     TOTAL HIP ARTHROPLASTY Left 02/13/2019   Procedure: TOTAL  HIP ARTHROPLASTY ANTERIOR APPROACH;  Surgeon: Ernie Cough, MD;  Location: [facility] ORS;  Service: Orthopedics;  Laterality: Left;  70 mins   UPPER GASTROINTESTINAL ENDOSCOPY      Family History  Problem Relation Age of Onset   Colitis Mother    Irritable bowel syndrome Mother    Hypertension Father    Hyperlipidemia Sister    Hyperlipidemia Brother    Heart attack Brother    Hyperlipidemia Brother    Heart attack Brother    Hyperlipidemia Brother    Heart attack Brother    Stomach cancer Paternal Grandmother 20   Colon cancer Paternal Grandmother    Leukemia Daughter    Arthritis Daughter    Heart disease Daughter        ASD vs VSD   Esophageal cancer Neg Hx    Pancreatic cancer Neg Hx    Rectal cancer Neg Hx     Allergies[1]  Medications Ordered Prior to Encounter[2]  BP 118/60   Pulse 77   Temp 97.6 F (36.4 C) (Oral)   Resp 15   Ht 4' 10 (1.473 m)   SpO2 99%   BMI 23.41 kg/m         Objective:   Physical Exam  General- No acute distress. Pleasant patient. Neck- Full range of motion, no jvd Lungs- sounds tight but even and unlabored. Mild rough breath sounds in upper lobes. Heart- regular rate and rhythm. Neurologic- CNII- XII grossly intact.  Heent- no sinus pressure. Lower ext- calves symmetric, no pedal edema and negative homans signs bilaterally.      Assessment & Plan:   Bronchitis with asthma exacerbation Persistent respiratory symptoms with wheezing, likely due to respiratory infection. Previous antibiotic treatment caused joint pain and fatigue. - Ordered chest x-ray to evaluate for pneumonia. - Ordered CBC to assess for infection. - Ordered metabolic panel to evaluate fatigue and nutritional status. - Prescribed azithromycin  (Z-Pak) for potential bacterial infection. - Prescribed benzonatate  for cough. - Continue nebulizer treatment at night. - Consider Medrol  tapered dose pack if wheezing worsens. - Monitor oxygen saturation and respiratory  status.  Fatigue Persistent fatigue. Maybe related to some dehydration or illness. - Ordered metabolic panel to assess nutritional status and electrolyte balance. - Encouraged hydration and nutritional intake.  Follow up next tuesday or sooner if needed  [employer], PA-C     [1]  Allergies Allergen Reactions   Valium  [Diazepam ] Shortness Of Breath    Sob and bradycardia   Albuterol  And Levalbuterol  Palpitations    Episode of palpitations, tremor and anxiety when administered levalbuterol  for PFT. Recommend avoiding this medicaiton class   Rosuvastatin  Other (See Comments)    myalgias  [2]  Current Outpatient Medications on File Prior to Visit  Medication Sig Dispense Refill   atorvastatin  (LIPITOR) 20 MG tablet Take 1 tablet (20 mg total) by mouth daily. 30 tablet 6   benzonatate  (TESSALON ) 200 MG capsule Take 1 capsule (200 mg total) by mouth 3 (three) times daily as needed for cough. 20 capsule 0   citalopram  (CELEXA ) 20 MG tablet Take 1 tablet (20 mg total) by mouth at bedtime. 30 tablet 3   fexofenadine  (ALLEGRA  ALLERGY ) 180 MG tablet Take 1 tablet (180 mg total) by mouth daily as needed for allergies or rhinitis.     hyoscyamine  (LEVSIN  SL) 0.125 MG SL tablet Dissolve 1 tablet under tongue at least twice daily 60 tablet 2   levothyroxine  (SYNTHROID ) 50 MCG tablet TAKE 1 TABLET BY MOUTH ONCE DAILY BEFORE BREAKFAST 90 tablet 1  lisinopril  (ZESTRIL ) 10 MG tablet Take 0.5 tablets (5 mg total) by mouth 2 (two) times daily. 90 tablet 1   Magnesium  Citrate (MAGNESIUM  GUMMIES PO) Take by mouth daily.     methylPREDNISolone  (MEDROL  DOSEPAK) 4 MG TBPK tablet As directed 21 tablet 0   omega-3 acid ethyl esters (LOVAZA ) 1 g capsule Take 2 g by mouth daily.     omeprazole  (PRILOSEC) 40 MG capsule Take 1 capsule (40 mg total) by mouth 2 (two) times daily. 60 capsule 5   ondansetron  (ZOFRAN -ODT) 4 MG disintegrating tablet Take 4 mg by mouth every 8 (eight) hours as needed.      Peppermint Oil (IBGARD) 90 MG CPCR Use as directed     potassium chloride  (MICRO-K ) 10 MEQ CR capsule Take 1 capsule by mouth once daily 90 capsule 0   sucralfate  (CARAFATE ) 1 g tablet Take 1 tablet by mouth twice daily 60 tablet 0   valACYclovir  (VALTREX ) 1000 MG tablet Take 1 tablet (1,000 mg total) by mouth 2 (two) times daily. 14 tablet 0   Vitamin D , Ergocalciferol , (DRISDOL ) 1.25 MG (50000 UNIT) CAPS capsule Take 1 capsule (50,000 Units total) by mouth every 7 (seven) days. 5 capsule 4   XDEMVY 0.25 % SOLN Apply 1 drop to eye 2 (two) times daily.     DULoxetine  (CYMBALTA ) 30 MG capsule Take 30 mg by mouth daily. (Patient not taking: Reported on 06/12/2024)     estradiol  (ESTRACE ) 0.1 MG/GM vaginal cream Place 0.5 g vaginally 2 (two) times a week. Place 0.5g nightly for two weeks then twice a week after (Patient not taking: Reported on 06/12/2024) 42.5 g 11   Fluticasone  Furoate (ARNUITY ELLIPTA ) 100 MCG/ACT AEPB Inhale 1 puff into the lungs daily. (Patient not taking: Reported on 06/12/2024) 30 each 5   furosemide  (LASIX ) 20 MG tablet Take 1 tablet (20 mg total) by mouth daily. (Patient not taking: Reported on 06/12/2024) 90 tablet 1   Loteprednol Etabonate 0.5 % GEL Apply 1 drop to eye 4 (four) times daily. (Patient not taking: Reported on 06/12/2024)     No current facility-administered medications on file prior to visit.   "

## 2024-06-19 ENCOUNTER — Ambulatory Visit: Admitting: Medical

## 2024-06-19 VITALS — BP 110/78 | HR 77 | Resp 14 | Ht <= 58 in | Wt 110.6 lb

## 2024-06-19 DIAGNOSIS — J40 Bronchitis, not specified as acute or chronic: Secondary | ICD-10-CM | POA: Diagnosis not present

## 2024-06-19 DIAGNOSIS — R062 Wheezing: Secondary | ICD-10-CM

## 2024-06-19 MED ORDER — METHYLPREDNISOLONE 4 MG PO TABS: ORAL_TABLET | ORAL | 0 refills | Status: AC

## 2024-06-19 NOTE — Progress Notes (Signed)
 "  Subjective:    Patient ID: Julie Wilkerson, female    DOB: [birth year], 84 y.o.   MRN: [id number]  HPI  Julie Wilkerson is an 84 year old female who presents for follow-up of bronchitis and asthma exacerbation.  She was seen seven days ago for bronchitis with asthma exacerbation and prescribed azithromycin , benzonatate , and nebulizer treatments. She feels overall alot better estimating 50% better but still has persistent cough and some fatigue. She has been using nebulizer treatments at night and has not started Medrol .  She has chronic mild anemia on iron  supplements. Recent blood work showed no leukocytosis. Chest x-ray was reviewed, and COVID and flu testing at the last visit were negative.  She has occasional wheezing and intermittent dizziness, though she is not dizzy today. Albuterol  causes jitteriness and nervousness, and she has not used the alternative bronchodilator discussed. Her current medications are benzonatate  and iron . She needs a refill of her nebulized medication.  Discussion on pt dizziness today note rare and transient. Not present now and if is constant advise pt to let me know.    Review of Systems  Constitutional:  Positive for fatigue. Negative for chills and fever.       See hpi  HENT:  Negative for congestion.   Respiratory:  Positive for cough and wheezing. Negative for chest tightness.   Cardiovascular:  Negative for chest pain and palpitations.  Gastrointestinal:  Negative for abdominal pain, nausea and vomiting.  Genitourinary:  Negative for dysuria and frequency.  Musculoskeletal:  Negative for back pain, myalgias and neck stiffness.  Skin:  Negative for rash.  Neurological:  Negative for dizziness, weakness and light-headedness.  Hematological:  Negative for adenopathy.  Psychiatric/Behavioral:  Negative for behavioral problems and decreased concentration. The patient is not nervous/anxious.      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 lumpectomy, radtiation x 30   Breast cancer in female [facility])    Bursitis of left hip 09/04/2018   Change in bowel habits 02/22/2018   Change in mole 06/08/2016   Chest pain 03/04/2020   CHF (congestive heart failure) (HCC)    Chronic bilateral thoracic back pain 05/23/2016   Chronic diastolic congestive heart failure (HCC) 05/28/2019   Colitis    Complication of anesthesia    VERY SENSITIVE TO ANESTHESIA    Constipation 01/17/2018   Coronary artery disease involving native coronary artery of native heart without angina pectoris 05/28/2019   Cortical age-related cataract of right eye 04/18/2018   Cough variant asthma  vs UACS from ACEi     Spirometry 12/02/2016  FEV1 1.26 (76%)  Ratio 78 with min curvature p last saba > 6 h prior  - trial off acei 12/02/2016  - 12/02/2016  After extensive coaching HFA effectiveness =    75% with qvar  autohaler > rechallenge with 80 2bid     Decreased visual acuity 01/16/2017   Degeneration of lumbar intervertebral  disc 08/01/2018   Diarrhea 11/16/2015   D/o Diverticuli, polyps, celiac disease.     Dyspnea 11/04/2016   with asthma exacerbation only   Dysuria 11/04/2016   Elevated sed rate 10/23/2017   Essential hypertension    Trial off acei 12/02/2016 due to cough/ pseudoasthma   GERD (gastroesophageal reflux disease)    Gluten intolerance    History of chicken pox    History of Helicobacter pylori infection 08/01/2015   History of rectal  bleeding 06/01/2019   Hx of LASIK 04/24/2019   Hyperglycemia 11/07/2016   Hyperlipidemia    Hypertension    Hyponatremia 02/16/2019   Hypothyroidism    IBS (irritable bowel syndrome)    Lattice degeneration of right retina 04/24/2019   Left hand pain 05/07/2018   Left hip pain 03/01/2017   Left leg pain 03/01/2017   Left shoulder pain 05/07/2018   Low back pain 08/10/2015   LUQ pain 06/01/2019   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   Neck pain 03/01/2017   Nuclear sclerotic cataract of right eye 04/18/2018   Osteoporosis    Overweight (BMI 25.0-29.9) 02/14/2019   Pain in joint of left shoulder 08/11/2018   Paroxysmal atrial tachycardia 05/28/2019   Pelvic pain 07/02/2018   Personal history of radiation therapy    Pneumonia    PONV (postoperative nausea and vomiting)    Poor appetite 02/22/2018   Pseudophakia of left eye 04/18/2018   PVD (posterior vitreous detachment), right 04/24/2019   Restless sleeper 11/07/2016   Right hip pain 10/23/2017   RLQ discomfort 03/01/2017   RLS (restless legs syndrome) 05/07/2019   S/P left THA, AA 02/13/2019   Sepsis due to pneumonia (HCC) 02/16/2019   Sleep apnea    uses CPAP   Stomach cramps    Tremor 02/15/2016   Urinary frequency 12/22/2017   Urinary incontinence 05/07/2019   Urinary tract infection 11/04/2016   Vitamin D  deficiency 04/29/2016     Social History   Socioeconomic History   Marital status: Single    Spouse name: Not on file   Number of children: 3   Years of education: 18   Highest education level: Bachelor's degree (e.g., BA, AB, BS)  Occupational History   Occupation: retired    Comment: runner, broadcasting/film/video, kindergarten  Tobacco Use   Smoking status: Never   Smokeless tobacco: Never  Vaping Use   Vaping status: Never Used  Substance and Sexual Activity   Alcohol use: Never    Alcohol/week: 0.0 standard drinks of alcohol   Drug use: Never   Sexual activity: Not  Currently    Birth control/protection: None    Comment: lives alone, avoids daiiry and gluten. 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  Other Topics Concern   Not on file  Social History Narrative   Not on file   Social Drivers of Health   Tobacco Use: Low Risk (06/12/2024)   Patient History    Smoking Tobacco Use: Never    Smokeless Tobacco Use: Never    Passive Exposure: Not on file  Financial Resource Strain: Low Risk (10/18/2023)   Received from [facility]   Overall Financial Resource Strain (CARDIA)    Difficulty of Paying Living Expenses: Not hard at all  Food Insecurity: No Food Insecurity (10/18/2023)   Received from [facility]   Epic    Within the past 12 months, you worried that your food would run out before you got the money to buy more.: Never true  Within the past 12 months, the food you bought just didn't last and you didn't have money to get more.: Never true  Transportation Needs: No Transportation Needs (10/18/2023)   Received from [facility]   PRAPARE - Transportation    Lack of Transportation (Medical): No    Lack of Transportation (Non-Medical): No  Physical Activity: Sufficiently Active (06/14/2023)   Exercise Vital Sign    Days of Exercise per Week: 3 days    Minutes of Exercise per Session: 60 min  Recent Concern: Physical Activity - Inactive (05/24/2023)   Exercise Vital Sign    Days of Exercise per Week: 0 days    Minutes of Exercise per Session: 20 min  Stress: No Stress Concern Present (06/14/2023)   [employer] - Occupational Stress Questionnaire    Feeling of Stress : Not at all  Social Connections: Socially Integrated (10/18/2023)   Received from [facility]   Social Network    How would you rate your social network (family, work, friends)?: Good participation with social networks  Intimate Partner Violence: Not At Risk (10/18/2023)   Received from [facility]   HITS    Over the last 12 months how often did your  partner physically hurt you?: Never    Over the last 12 months how often did your partner insult you or talk down to you?: Never    Over the last 12 months how often did your partner threaten you with physical harm?: Never    Over the last 12 months how often did your partner scream or curse at you?: Never  Depression (PHQ2-9): Low Risk (03/22/2024)   Depression (PHQ2-9)    PHQ-2 Score: 0  Alcohol Screen: Low Risk (06/14/2023)   Alcohol Screen    Last Alcohol Screening Score (AUDIT): 0  Housing: Low Risk (10/18/2023)   Received from [facility]    In the last 12 months, was there a time when you were not able to pay the mortgage or rent on time?: No    In the past 12 months, how many times have you moved where you were living?: 0    At any time in the past 12 months, were you homeless or living in a shelter (including now)?: No  Utilities: Not At Risk (10/18/2023)   Received from [facility] Utilities    Threatened with loss of utilities: No  Health Literacy: Adequate Health Literacy (06/14/2023)   B1300 Health Literacy    Frequency of need for help with medical instructions: Never    Past Surgical History:  Procedure Laterality Date   ABDOMINAL HYSTERECTOMY     partial   APPENDECTOMY     BREAST LUMPECTOMY Left 2002   CHOLECYSTECTOMY  Age 62 or 41   COLONOSCOPY  2017   EYE SURGERY Left    cataract   LEFT HEART CATH AND CORONARY ANGIOGRAPHY N/A 02/09/2019   Procedure: LEFT HEART CATH AND CORONARY ANGIOGRAPHY;  Surgeon: Mady Bruckner, MD;  Location: [facility] INVASIVE CV LAB;  Service: Cardiovascular;  Laterality: N/A;   TONSILLECTOMY     TOTAL HIP ARTHROPLASTY Left 02/13/2019   Procedure: TOTAL HIP ARTHROPLASTY ANTERIOR APPROACH;  Surgeon: Ernie Cough, MD;  Location: [facility] ORS;  Service: Orthopedics;  Laterality: Left;  70 mins   UPPER GASTROINTESTINAL ENDOSCOPY      Family History  Problem Relation Age of Onset   Colitis Mother    Irritable bowel syndrome Mother  Hypertension Father    Hyperlipidemia Sister    Hyperlipidemia Brother    Heart attack Brother    Hyperlipidemia Brother    Heart attack Brother    Hyperlipidemia Brother    Heart attack Brother    Stomach cancer Paternal Grandmother 37   Colon cancer Paternal Grandmother    Leukemia Daughter    Arthritis Daughter    Heart disease Daughter        ASD vs VSD   Esophageal cancer Neg Hx    Pancreatic cancer Neg Hx    Rectal cancer Neg Hx     Allergies[1]  Medications Ordered Prior to Encounter[2]  BP 110/78   Pulse 77   Resp 14   Ht 4' 10 (1.473 m)   Wt 110 lb 9.6 oz (50.2 kg)   SpO2 96%   BMI 23.12 kg/m          Objective:   Physical Exam  General Mental Status- Alert. General Appearance- Not in acute distress.   Skin General: Color- Normal Color. Moisture- Normal Moisture.  Neck  No carotid bruits. No JVD.  Chest and Lung Exam Auscultation: Breath Sounds:-Normal.  Cardiovascular Auscultation:Rythm- Regular. Murmurs & Other Heart Sounds:Auscultation of the heart reveals- No Murmurs.  Abdomen Inspection:-Inspeection Normal. Palpation/Percussion:Note:No mass. Palpation and Percussion of the abdomen reveal- Non Tender, Non Distended + BS, no rebound or guarding.   Neurologic Cranial Nerve exam:- CN III-XII intact(No nystagmus), symmetric smile. Strength:- 5/5 equal and symmetric strength both upper and lower extremities.   Lower ext- calfs symmetric, negative homans signs. No pedal edema bilaterally.    Assessment & Plan:  Bronchitis with asthma exacerbation Persistent cough and wheezing for over two months.(but now better 50%) Previous treatments included azithromycin , benzonatate , and nebulizer therapy. No pneumonia on chest x-ray. Negative COVID and flu tests last visit. Oxygen saturation at 96%. - Initiated Medro 4 mg taper pack for six days. - Continue benzonatate  cough tablets. - Ensure adequate hydration. - Monitor symptoms and provide  update in 7-10 days. Sooner if symptoms worsen. - Consider Pulmicort  nebulizer solution if wheezing persists after Medrol  taper.   Lashina Milles, PA-C     [1]  Allergies Allergen Reactions   Valium  [Diazepam ] Shortness Of Breath    Sob and bradycardia   Albuterol  And Levalbuterol  Palpitations    Episode of palpitations, tremor and anxiety when administered levalbuterol  for PFT. Recommend avoiding this medicaiton class   Rosuvastatin  Other (See Comments)    myalgias  [2]  Current Outpatient Medications on File Prior to Visit  Medication Sig Dispense Refill   atorvastatin  (LIPITOR) 20 MG tablet Take 1 tablet (20 mg total) by mouth daily. 30 tablet 6   benzonatate  (TESSALON ) 100 MG capsule Take 1 capsule (100 mg total) by mouth 3 (three) times daily as needed for cough. 30 capsule 0   benzonatate  (TESSALON ) 200 MG capsule Take 1 capsule (200 mg total) by mouth 3 (three) times daily as needed for cough. 20 capsule 0   citalopram  (CELEXA ) 20 MG tablet Take 1 tablet (20 mg total) by mouth at bedtime. 30 tablet 3   fexofenadine  (ALLEGRA  ALLERGY ) 180 MG tablet Take 1 tablet (180 mg total) by mouth daily as needed for allergies or rhinitis.     hyoscyamine  (LEVSIN  SL) 0.125 MG SL tablet Dissolve 1 tablet under tongue at least twice daily 60 tablet 2   levothyroxine  (SYNTHROID ) 50 MCG tablet TAKE 1 TABLET BY MOUTH ONCE DAILY BEFORE BREAKFAST 90 tablet 1   lisinopril  (ZESTRIL )  10 MG tablet Take 0.5 tablets (5 mg total) by mouth 2 (two) times daily. 90 tablet 1   Magnesium  Citrate (MAGNESIUM  GUMMIES PO) Take by mouth daily.     methylPREDNISolone  (MEDROL  DOSEPAK) 4 MG TBPK tablet As directed 21 tablet 0   omega-3 acid ethyl esters (LOVAZA ) 1 g capsule Take 2 g by mouth daily.     omeprazole  (PRILOSEC) 40 MG capsule Take 1 capsule (40 mg total) by mouth 2 (two) times daily. 60 capsule 5   ondansetron  (ZOFRAN -ODT) 4 MG disintegrating tablet Take 4 mg by mouth every 8 (eight) hours as needed.      Peppermint Oil (IBGARD) 90 MG CPCR Use as directed     potassium chloride  (MICRO-K ) 10 MEQ CR capsule Take 1 capsule by mouth once daily 90 capsule 0   sucralfate  (CARAFATE ) 1 g tablet Take 1 tablet by mouth twice daily 60 tablet 0   valACYclovir  (VALTREX ) 1000 MG tablet Take 1 tablet (1,000 mg total) by mouth 2 (two) times daily. 14 tablet 0   Vitamin D , Ergocalciferol , (DRISDOL ) 1.25 MG (50000 UNIT) CAPS capsule Take 1 capsule (50,000 Units total) by mouth every 7 (seven) days. 5 capsule 4   XDEMVY 0.25 % SOLN Apply 1 drop to eye 2 (two) times daily.     DULoxetine  (CYMBALTA ) 30 MG capsule Take 30 mg by mouth daily. (Patient not taking: Reported on 06/19/2024)     estradiol  (ESTRACE ) 0.1 MG/GM vaginal cream Place 0.5 g vaginally 2 (two) times a week. Place 0.5g nightly for two weeks then twice a week after (Patient not taking: Reported on 06/19/2024) 42.5 g 11   Fluticasone  Furoate (ARNUITY ELLIPTA ) 100 MCG/ACT AEPB Inhale 1 puff into the lungs daily. (Patient not taking: Reported on 06/19/2024) 30 each 5   furosemide  (LASIX ) 20 MG tablet Take 1 tablet (20 mg total) by mouth daily. (Patient not taking: Reported on 06/19/2024) 90 tablet 1   Loteprednol Etabonate 0.5 % GEL Apply 1 drop to eye 4 (four) times daily. (Patient not taking: Reported on 06/19/2024)     No current facility-administered medications on file prior to visit.   "

## 2024-06-19 NOTE — Patient Instructions (Signed)
 Bronchitis with asthma exacerbation Persistent cough and wheezing for over two months.(but now better 50%) Previous treatments included azithromycin , benzonatate , and nebulizer therapy. No pneumonia on chest x-ray. Negative COVID and flu tests last visit. Oxygen saturation at 96%. - Initiated Medro 4 mg taper pack for six days. - Continue benzonatate  cough tablets. - Ensure adequate hydration. - Monitor symptoms and provide update in 7-10 days. Sooner if symptoms worsen. - Consider Pulmicort  nebulizer solution if wheezing persists after Medrol  taper.

## 2024-06-26 NOTE — Assessment & Plan Note (Signed)
 On Levothyroxine , continue to monitor.  Lab Results  Component Value Date   TSH 1.62 03/22/2024

## 2024-06-26 NOTE — Assessment & Plan Note (Signed)
 Encouraged to get adequate exercise, calcium and vitamin d intake

## 2024-06-26 NOTE — Assessment & Plan Note (Signed)
 Denies recent exacerbation.

## 2024-06-26 NOTE — Assessment & Plan Note (Addendum)
 Upcoming appointment with lipid clinic, follows with Cardiology. Encourage heart healthy diet such as MIND or DASH diet, increase exercise, avoid trans fats, simple carbohydrates and processed foods, consider a krill or fish or flaxseed oil cap daily.

## 2024-06-26 NOTE — Assessment & Plan Note (Signed)
 Stable on Cymbalta 60 mg daily.

## 2024-06-27 ENCOUNTER — Ambulatory Visit

## 2024-06-27 ENCOUNTER — Encounter: Payer: Self-pay | Admitting: Student

## 2024-06-27 ENCOUNTER — Ambulatory Visit: Admitting: Student

## 2024-06-27 DIAGNOSIS — I999 Unspecified disorder of circulatory system: Secondary | ICD-10-CM

## 2024-06-27 DIAGNOSIS — E782 Mixed hyperlipidemia: Secondary | ICD-10-CM

## 2024-06-27 DIAGNOSIS — E039 Hypothyroidism, unspecified: Secondary | ICD-10-CM

## 2024-06-27 DIAGNOSIS — K529 Noninfective gastroenteritis and colitis, unspecified: Secondary | ICD-10-CM

## 2024-06-27 DIAGNOSIS — J4520 Mild intermittent asthma, uncomplicated: Secondary | ICD-10-CM

## 2024-06-27 DIAGNOSIS — T466X5A Adverse effect of antihyperlipidemic and antiarteriosclerotic drugs, initial encounter: Secondary | ICD-10-CM

## 2024-06-27 DIAGNOSIS — E559 Vitamin D deficiency, unspecified: Secondary | ICD-10-CM

## 2024-06-27 DIAGNOSIS — K219 Gastro-esophageal reflux disease without esophagitis: Secondary | ICD-10-CM

## 2024-06-27 DIAGNOSIS — G4730 Sleep apnea, unspecified: Secondary | ICD-10-CM

## 2024-06-27 DIAGNOSIS — I5032 Chronic diastolic (congestive) heart failure: Secondary | ICD-10-CM

## 2024-06-27 DIAGNOSIS — R739 Hyperglycemia, unspecified: Secondary | ICD-10-CM

## 2024-06-27 DIAGNOSIS — I10 Essential (primary) hypertension: Secondary | ICD-10-CM

## 2024-06-27 DIAGNOSIS — I2510 Atherosclerotic heart disease of native coronary artery without angina pectoris: Secondary | ICD-10-CM

## 2024-06-27 DIAGNOSIS — D649 Anemia, unspecified: Secondary | ICD-10-CM

## 2024-06-27 DIAGNOSIS — M810 Age-related osteoporosis without current pathological fracture: Secondary | ICD-10-CM

## 2024-06-27 DIAGNOSIS — F419 Anxiety disorder, unspecified: Secondary | ICD-10-CM

## 2024-06-28 ENCOUNTER — Ambulatory Visit: Admitting: Cardiology

## 2024-07-04 ENCOUNTER — Ambulatory Visit

## 2024-07-09 ENCOUNTER — Ambulatory Visit: Admitting: Family Medicine

## 2024-10-29 ENCOUNTER — Encounter: Admitting: Family Medicine
